# Patient Record
Sex: Male | Born: 1935 | Race: White | Hispanic: No | Marital: Married | Smoking: Former smoker
Health system: Southern US, Community
[De-identification: ages and names within clinical notes are randomized; demographics above are authoritative.]

## PROBLEM LIST (undated history)

## (undated) DIAGNOSIS — Z960 Presence of urogenital implants: Secondary | ICD-10-CM

## (undated) DIAGNOSIS — I714 Abdominal aortic aneurysm, without rupture, unspecified: Secondary | ICD-10-CM

## (undated) DIAGNOSIS — I4891 Unspecified atrial fibrillation: Secondary | ICD-10-CM

## (undated) DIAGNOSIS — R42 Dizziness and giddiness: Secondary | ICD-10-CM

## (undated) DIAGNOSIS — E785 Hyperlipidemia, unspecified: Secondary | ICD-10-CM

## (undated) DIAGNOSIS — J189 Pneumonia, unspecified organism: Secondary | ICD-10-CM

## (undated) DIAGNOSIS — E1169 Type 2 diabetes mellitus with other specified complication: Secondary | ICD-10-CM

## (undated) DIAGNOSIS — G4730 Sleep apnea, unspecified: Secondary | ICD-10-CM

## (undated) DIAGNOSIS — N179 Acute kidney failure, unspecified: Secondary | ICD-10-CM

## (undated) DIAGNOSIS — I25119 Atherosclerotic heart disease of native coronary artery with unspecified angina pectoris: Secondary | ICD-10-CM

## (undated) DIAGNOSIS — D62 Acute posthemorrhagic anemia: Secondary | ICD-10-CM

## (undated) DIAGNOSIS — I214 Non-ST elevation (NSTEMI) myocardial infarction: Secondary | ICD-10-CM

## (undated) DIAGNOSIS — I2119 ST elevation (STEMI) myocardial infarction involving other coronary artery of inferior wall: Secondary | ICD-10-CM

## (undated) DIAGNOSIS — D649 Anemia, unspecified: Secondary | ICD-10-CM

## (undated) DIAGNOSIS — M1990 Unspecified osteoarthritis, unspecified site: Secondary | ICD-10-CM

## (undated) DIAGNOSIS — Z978 Presence of other specified devices: Secondary | ICD-10-CM

## (undated) DIAGNOSIS — Z9359 Other cystostomy status: Secondary | ICD-10-CM

## (undated) DIAGNOSIS — D638 Anemia in other chronic diseases classified elsewhere: Secondary | ICD-10-CM

## (undated) DIAGNOSIS — IMO0001 Reserved for inherently not codable concepts without codable children: Secondary | ICD-10-CM

## (undated) DIAGNOSIS — I10 Essential (primary) hypertension: Secondary | ICD-10-CM

## (undated) DIAGNOSIS — Z789 Other specified health status: Secondary | ICD-10-CM

## (undated) DIAGNOSIS — I5042 Chronic combined systolic (congestive) and diastolic (congestive) heart failure: Secondary | ICD-10-CM

## (undated) DIAGNOSIS — I6522 Occlusion and stenosis of left carotid artery: Secondary | ICD-10-CM

## (undated) DIAGNOSIS — E1142 Type 2 diabetes mellitus with diabetic polyneuropathy: Secondary | ICD-10-CM

## (undated) DIAGNOSIS — Z951 Presence of aortocoronary bypass graft: Secondary | ICD-10-CM

## (undated) DIAGNOSIS — I9789 Other postprocedural complications and disorders of the circulatory system, not elsewhere classified: Secondary | ICD-10-CM

## (undated) DIAGNOSIS — E1165 Type 2 diabetes mellitus with hyperglycemia: Secondary | ICD-10-CM

## (undated) DIAGNOSIS — I169 Hypertensive crisis, unspecified: Secondary | ICD-10-CM

## (undated) DIAGNOSIS — I5189 Other ill-defined heart diseases: Secondary | ICD-10-CM

## (undated) DIAGNOSIS — Z531 Procedure and treatment not carried out because of patient's decision for reasons of belief and group pressure: Secondary | ICD-10-CM

## (undated) DIAGNOSIS — E119 Type 2 diabetes mellitus without complications: Secondary | ICD-10-CM

## (undated) DIAGNOSIS — N480 Leukoplakia of penis: Secondary | ICD-10-CM

## (undated) DIAGNOSIS — I2510 Atherosclerotic heart disease of native coronary artery without angina pectoris: Secondary | ICD-10-CM

## (undated) DIAGNOSIS — I639 Cerebral infarction, unspecified: Secondary | ICD-10-CM

## (undated) HISTORY — DX: Essential (primary) hypertension: I10

## (undated) HISTORY — DX: Occlusion and stenosis of left carotid artery: I65.22

## (undated) HISTORY — DX: Type 2 diabetes mellitus with diabetic polyneuropathy: E11.65

## (undated) HISTORY — PX: NO PAST SURGERIES: SHX2092

## (undated) HISTORY — DX: Unspecified atrial fibrillation: I48.91

## (undated) HISTORY — PX: CYSTOSCOPY WITH URETHRAL DILATATION: SHX5125

## (undated) HISTORY — DX: Anemia in other chronic diseases classified elsewhere: D63.8

## (undated) HISTORY — PX: APPENDECTOMY: SHX54

## (undated) HISTORY — DX: Hypertensive crisis, unspecified: I16.9

## (undated) HISTORY — DX: Non-ST elevation (NSTEMI) myocardial infarction: I21.4

## (undated) HISTORY — DX: Type 2 diabetes mellitus with other specified complication: E11.69

## (undated) HISTORY — DX: Atherosclerotic heart disease of native coronary artery without angina pectoris: I25.10

## (undated) HISTORY — DX: Type 2 diabetes mellitus with diabetic polyneuropathy: E11.42

## (undated) HISTORY — DX: Leukoplakia of penis: N48.0

## (undated) HISTORY — DX: Abdominal aortic aneurysm, without rupture: I71.4

## (undated) HISTORY — DX: Other cystostomy status: Z93.59

## (undated) HISTORY — DX: Abdominal aortic aneurysm, without rupture, unspecified: I71.40

## (undated) HISTORY — DX: Presence of aortocoronary bypass graft: Z95.1

## (undated) HISTORY — DX: Procedure and treatment not carried out because of patient's decision for reasons of belief and group pressure: Z53.1

## (undated) HISTORY — DX: ST elevation (STEMI) myocardial infarction involving other coronary artery of inferior wall: I21.19

## (undated) HISTORY — DX: Atherosclerotic heart disease of native coronary artery with unspecified angina pectoris: I25.119

## (undated) HISTORY — DX: Dizziness and giddiness: R42

## (undated) HISTORY — DX: Hyperlipidemia, unspecified: E78.5

## (undated) HISTORY — DX: Chronic combined systolic (congestive) and diastolic (congestive) heart failure: I50.42

## (undated) HISTORY — DX: Other ill-defined heart diseases: I51.89

## (undated) HISTORY — DX: Acute posthemorrhagic anemia: D62

## (undated) HISTORY — DX: Reserved for inherently not codable concepts without codable children: IMO0001

## (undated) HISTORY — DX: Sleep apnea, unspecified: G47.30

## (undated) HISTORY — DX: Other postprocedural complications and disorders of the circulatory system, not elsewhere classified: I97.89

## (undated) HISTORY — PX: TONSILLECTOMY: SUR1361

## (undated) HISTORY — PX: TRANSTHORACIC ECHOCARDIOGRAM: SHX275

---

## 1998-05-23 ENCOUNTER — Ambulatory Visit (HOSPITAL_BASED_OUTPATIENT_CLINIC_OR_DEPARTMENT_OTHER): Admission: RE | Admit: 1998-05-23 | Discharge: 1998-05-23 | Payer: Self-pay | Admitting: Urology

## 2008-10-22 ENCOUNTER — Encounter (INDEPENDENT_AMBULATORY_CARE_PROVIDER_SITE_OTHER): Payer: Self-pay | Admitting: General Surgery

## 2008-10-22 ENCOUNTER — Inpatient Hospital Stay (HOSPITAL_COMMUNITY): Admission: EM | Admit: 2008-10-22 | Discharge: 2008-10-27 | Payer: Self-pay | Admitting: Emergency Medicine

## 2009-06-13 ENCOUNTER — Emergency Department (HOSPITAL_COMMUNITY): Admission: EM | Admit: 2009-06-13 | Discharge: 2009-06-13 | Payer: Self-pay | Admitting: Emergency Medicine

## 2009-06-19 ENCOUNTER — Encounter: Admission: RE | Admit: 2009-06-19 | Discharge: 2009-06-19 | Payer: Self-pay | Admitting: Family Medicine

## 2010-02-09 ENCOUNTER — Inpatient Hospital Stay (HOSPITAL_COMMUNITY): Admission: EM | Admit: 2010-02-09 | Discharge: 2010-02-09 | Payer: Self-pay | Admitting: Emergency Medicine

## 2010-02-19 ENCOUNTER — Encounter: Admission: RE | Admit: 2010-02-19 | Discharge: 2010-02-19 | Payer: Self-pay | Admitting: Cardiology

## 2010-06-04 ENCOUNTER — Encounter
Admission: RE | Admit: 2010-06-04 | Discharge: 2010-06-04 | Payer: Self-pay | Source: Home / Self Care | Attending: Family Medicine | Admitting: Family Medicine

## 2010-07-31 LAB — DIFFERENTIAL
Basophils Absolute: 0 10*3/uL (ref 0.0–0.1)
Basophils Relative: 1 % (ref 0–1)
Neutro Abs: 5.2 10*3/uL (ref 1.7–7.7)
Neutrophils Relative %: 64 % (ref 43–77)

## 2010-07-31 LAB — COMPREHENSIVE METABOLIC PANEL
ALT: 28 U/L (ref 0–53)
AST: 27 U/L (ref 0–37)
Albumin: 3.6 g/dL (ref 3.5–5.2)
Alkaline Phosphatase: 66 U/L (ref 39–117)
Alkaline Phosphatase: 71 U/L (ref 39–117)
BUN: 19 mg/dL (ref 6–23)
CO2: 25 mEq/L (ref 19–32)
Chloride: 102 mEq/L (ref 96–112)
Creatinine, Ser: 1.01 mg/dL (ref 0.4–1.5)
GFR calc Af Amer: >60 mL/min (ref >60)
GFR calc non Af Amer: >60 mL/min (ref >60)
Glucose, Bld: 259 mg/dL — ABNORMAL HIGH (ref 70–99)
Potassium: 3.5 mEq/L (ref 3.5–5.1)
Potassium: 3.7 mEq/L (ref 3.5–5.1)
Sodium: 135 mEq/L (ref 135–145)
Total Bilirubin: 0.4 mg/dL (ref 0.3–1.2)
Total Bilirubin: 0.5 mg/dL (ref 0.3–1.2)
Total Protein: 7.3 g/dL (ref 6.0–8.3)

## 2010-07-31 LAB — CBC
HCT: 43.7 % (ref 39.0–52.0)
Hemoglobin: 12.8 g/dL — ABNORMAL LOW (ref 13.0–17.0)
MCHC: 33.9 g/dL (ref 30.0–36.0)
Platelets: 228 10*3/uL (ref 150–400)
Platelets: 229 10*3/uL (ref 150–400)
RBC: 4.45 MIL/uL (ref 4.22–5.81)
RDW: 14 % (ref 11.5–15.5)
WBC: 7.1 10*3/uL (ref 4.0–10.5)

## 2010-07-31 LAB — URINALYSIS, ROUTINE W REFLEX MICROSCOPIC
Leukocytes, UA: NEGATIVE
Nitrite: NEGATIVE
Specific Gravity, Urine: 1.007 (ref 1.005–1.030)
pH: 7.5 (ref 5.0–8.0)

## 2010-07-31 LAB — LIPID PANEL
LDL Cholesterol: 50 mg/dL (ref 0–99)
Total CHOL/HDL Ratio: 2.4 RATIO
Triglycerides: 113 mg/dL (ref <150)
VLDL: 23 mg/dL (ref 0–40)

## 2010-07-31 LAB — MAGNESIUM: Magnesium: 2.1 mg/dL (ref 1.5–2.5)

## 2010-07-31 LAB — GLUCOSE, CAPILLARY

## 2010-07-31 LAB — URINE MICROSCOPIC-ADD ON

## 2010-07-31 LAB — URINE CULTURE

## 2010-07-31 LAB — CK TOTAL AND CKMB (NOT AT ARMC)
CK, MB: 3.3 ng/mL (ref 0.3–4.0)
Relative Index: 1.8 (ref 0.0–2.5)

## 2010-07-31 LAB — TROPONIN I: Troponin I: 0.06 ng/mL (ref 0.00–0.06)

## 2010-07-31 LAB — HEPARIN LEVEL (UNFRACTIONATED): Heparin Unfractionated: 0.4 IU/mL (ref 0.30–0.70)

## 2010-07-31 LAB — CARDIAC PANEL(CRET KIN+CKTOT+MB+TROPI)
CK, MB: 2.9 ng/mL (ref 0.3–4.0)
Total CK: 135 U/L (ref 7–232)

## 2010-08-04 LAB — CBC
Hemoglobin: 14.1 g/dL (ref 13.0–17.0)
RDW: 13.3 % (ref 11.5–15.5)
WBC: 7.3 10*3/uL (ref 4.0–10.5)

## 2010-08-04 LAB — DIFFERENTIAL
Basophils Relative: 1 % (ref 0–1)
Eosinophils Absolute: 0.4 10*3/uL (ref 0.0–0.7)
Monocytes Absolute: 0.5 10*3/uL (ref 0.1–1.0)
Monocytes Relative: 7 % (ref 3–12)
Neutrophils Relative %: 64 % (ref 43–77)

## 2010-08-04 LAB — URINALYSIS, ROUTINE W REFLEX MICROSCOPIC
Bilirubin Urine: NEGATIVE
Hgb urine dipstick: NEGATIVE
Protein, ur: >300 mg/dL — AB
Urobilinogen, UA: 0.2 mg/dL (ref 0.0–1.0)

## 2010-08-04 LAB — COMPREHENSIVE METABOLIC PANEL
ALT: 21 U/L (ref 0–53)
Albumin: 3.7 g/dL (ref 3.5–5.2)
Alkaline Phosphatase: 66 U/L (ref 39–117)
Chloride: 104 mEq/L (ref 96–112)
Glucose, Bld: 162 mg/dL — ABNORMAL HIGH (ref 70–99)
Potassium: 4.8 mEq/L (ref 3.5–5.1)
Sodium: 140 mEq/L (ref 135–145)
Total Bilirubin: 0.4 mg/dL (ref 0.3–1.2)
Total Protein: 6.4 g/dL (ref 6.0–8.3)

## 2010-08-25 LAB — URINALYSIS, ROUTINE W REFLEX MICROSCOPIC
Bilirubin Urine: NEGATIVE
Glucose, UA: NEGATIVE mg/dL
Ketones, ur: 15 mg/dL — AB
pH: 5 (ref 5.0–8.0)

## 2010-08-25 LAB — DIFFERENTIAL
Eosinophils Absolute: 0 10*3/uL (ref 0.0–0.7)
Lymphs Abs: 0.6 10*3/uL — ABNORMAL LOW (ref 0.7–4.0)
Monocytes Relative: 4 % (ref 3–12)
Neutro Abs: 11.2 10*3/uL — ABNORMAL HIGH (ref 1.7–7.7)
Neutrophils Relative %: 91 % — ABNORMAL HIGH (ref 43–77)

## 2010-08-25 LAB — CBC
Hemoglobin: 13.5 g/dL (ref 13.0–17.0)
MCHC: 34.3 g/dL (ref 30.0–36.0)
MCV: 85.2 fL (ref 78.0–100.0)
MCV: 86 fL (ref 78.0–100.0)
Platelets: 248 10*3/uL (ref 150–400)
RBC: 4.31 MIL/uL (ref 4.22–5.81)
RBC: 4.58 MIL/uL (ref 4.22–5.81)
RDW: 14.4 % (ref 11.5–15.5)
WBC: 12.3 10*3/uL — ABNORMAL HIGH (ref 4.0–10.5)

## 2010-08-25 LAB — GLUCOSE, CAPILLARY
Glucose-Capillary: 134 mg/dL — ABNORMAL HIGH (ref 70–99)
Glucose-Capillary: 136 mg/dL — ABNORMAL HIGH (ref 70–99)
Glucose-Capillary: 139 mg/dL — ABNORMAL HIGH (ref 70–99)
Glucose-Capillary: 140 mg/dL — ABNORMAL HIGH (ref 70–99)
Glucose-Capillary: 142 mg/dL — ABNORMAL HIGH (ref 70–99)
Glucose-Capillary: 144 mg/dL — ABNORMAL HIGH (ref 70–99)
Glucose-Capillary: 144 mg/dL — ABNORMAL HIGH (ref 70–99)
Glucose-Capillary: 145 mg/dL — ABNORMAL HIGH (ref 70–99)
Glucose-Capillary: 146 mg/dL — ABNORMAL HIGH (ref 70–99)
Glucose-Capillary: 163 mg/dL — ABNORMAL HIGH (ref 70–99)
Glucose-Capillary: 191 mg/dL — ABNORMAL HIGH (ref 70–99)
Glucose-Capillary: 231 mg/dL — ABNORMAL HIGH (ref 70–99)

## 2010-08-25 LAB — BASIC METABOLIC PANEL
BUN: 17 mg/dL (ref 6–23)
CO2: 28 mEq/L (ref 19–32)
Calcium: 8.3 mg/dL — ABNORMAL LOW (ref 8.4–10.5)
Calcium: 8.9 mg/dL (ref 8.4–10.5)
Chloride: 104 mEq/L (ref 96–112)
Chloride: 105 mEq/L (ref 96–112)
Creatinine, Ser: 0.95 mg/dL (ref 0.4–1.5)
Creatinine, Ser: 1.01 mg/dL (ref 0.4–1.5)
GFR calc Af Amer: >60 mL/min (ref >60)
GFR calc Af Amer: >60 mL/min (ref >60)
GFR calc non Af Amer: >60 mL/min (ref >60)
Glucose, Bld: 139 mg/dL — ABNORMAL HIGH (ref 70–99)
Sodium: 138 mEq/L (ref 135–145)
Sodium: 140 mEq/L (ref 135–145)

## 2010-08-25 LAB — POCT I-STAT 4, (NA,K, GLUC, HGB,HCT)
Glucose, Bld: 102 mg/dL — ABNORMAL HIGH (ref 70–99)
Potassium: 3.7 mEq/L (ref 3.5–5.1)

## 2010-08-25 LAB — URINE MICROSCOPIC-ADD ON

## 2010-09-30 NOTE — H&P (Signed)
NAME:  [name], [name] NO.:  [id number]   MEDICAL RECORD NO.:  [id number]          PATIENT TYPE:  INP   LOCATION:  5125                         FACILITY:  [facility]   PHYSICIAN:  Gwenyth Ober, M.D.    DATE OF BIRTH:  [birth year]   DATE OF ADMISSION:  10/22/2008  DATE OF DISCHARGE:                              HISTORY & PHYSICAL   CHIEF COMPLAINT:  The patient is a 77 year old set up from [facility] in [place] with a diagnosis of acute  appendicitis noted on a CT scan.   HISTORY OF PRESENT ILLNESS:  When questioning the patient, he states he  has been having some lower abdominal discomfort for the past 3 weeks,  which he attributed to perhaps chronic urinary tract infection, which he  has had before or some prostatitis.  He worsened on Friday and then  through the weekend to where he had to come to his doctor down in the  [place] area where he was subsequently sent to [facility] for a CT  scan, which demonstrated acute appendicitis with an appendicolith.  There was no evidence of rupture.  He was sent directly up to [facility] for evaluation.  However, we did not receive a phone call from  any of his doctors.   PAST MEDICAL HISTORY:  Significant for;  1. Hypertension.  2. Non-insulin-dependent diabetes.   He is not allergic to any medications.   CURRENT MEDICATIONS:  1. Hydrochlorothiazide 25 mg p.o. at bedtime.  2. Metformin 1000 mg p.o. b.i.d.  3. Avandia 8 mg p.o. daily.  4. Altace/ramipril combination 5-10 mg p.o. daily.  5. Glimepiride 4 mg at bedtime.   He has had only tonsils and adenoids removed, no other surgery.   REVIEW OF SYSTEMS:  He has had no fevers, no chills, no nausea, no  vomiting.  Last meal was at 6 o'clock this morning.   Family history is noncontributory.  Last bowel movement was yesterday.   PHYSICAL EXAMINATION:  VITAL SIGNS:  On examination, he is afebrile, his  other vital signs are stable.  HEENT;  He is normocephalic and atraumatic and anicteric.  NECK:  Supple.  He has no carotid bruits.  No palpable masses.  LUNGS:  Clear to auscultation.  CARDIAC:  Regular rhythm and rate with no murmurs.  ABDOMEN:  Distended, mildly obese, tender with deep palpation in the  right lower quadrant without rebound or guarding.  RECTAL:  Not performed.  NEUROLOGIC:  Cranial nerves II through XII are grossly intact.   LABORATORY STUDIES:  From the outside facility, he has a normal white  count of 5.9, hemoglobin 13, hematocrit of 42, hemoglobin A1c is 7.3.  Electrolytes within normal limits.  Chest x-ray here does not show any  focal lesions, no evidence of congestive heart failure.   IMPRESSION:  Acute appendicitis by CT scan.   PLAN:  To perform an appendectomy laparoscopically.  This will be done  as soon as possible.  He will get Unasyn 3 g prior to going to the  operating room.  I  have explained the risks and benefits to the patient.  He understands including possibility of happen to convert to an open  procedure.      Gwenyth Ober, M.D.  Electronically Signed     JOW/MEDQ  D:  10/22/2008  T:  10/23/2008  Job:  [id number]

## 2010-09-30 NOTE — Op Note (Signed)
NAMEQWENTIN, MANER NO.:  [id number]   MEDICAL RECORD NO.:  [id number]          PATIENT TYPE:  INP   LOCATION:  5125                         FACILITY:  [facility]   PHYSICIAN:  Gwenyth Ober, M.D.    DATE OF BIRTH:  Nov 05, 1930   DATE OF PROCEDURE:  DATE OF DISCHARGE:                               OPERATIVE REPORT   PREOPERATIVE DIAGNOSIS:  Acute appendicitis, possible rupture.   POSTOPERATIVE DIAGNOSIS:  Ruptured acute appendicitis.   PROCEDURE:  Open appendectomy converted from laparoscopic attempt.   SURGEON:  Kathryne Eriksson. Hulen Skains, MD   ANESTHESIA:  General endotracheal.   ESTIMATED BLOOD LOSS:  100 mL.   COMPLICATIONS:  None.   CONDITION:  Stable.   FINDINGS:  Ruptured acute appendicitis tethered down to the right lower  quadrant, also to an appendix epiploica of the sigmoid colon.   INDICATIONS FOR OPERATION:  The patient is a 77 year old with abdominal  pain for several weeks, worsened over the last 3 days who now comes in  with a CT-diagnosed acute appendicitis with possible rupture.   OPERATION:  The patient was taken to the operating room and placed on  the table in the supine position.  After an adequate general  endotracheal anesthetic was administered, he was prepped and draped in  the usual sterile manner, exposing the midline and the entire abdomen.   Prior to prepping and draping the patient, Foley cath has been placed.  It was noted that the patient had somewhat of a phimosis.  We kept his  Foley in postoperatively.   The patient also had an umbilical hernia which was used as entry site  for our Hasson cannula which we placed after making circum-umbilical  incision using a 15-blade and then dissecting down the fascia around the  umbilical hernia.  A pursestring suture was placed with 0 Vicryl then  Hasson cannula passed into the peritoneal cavity but we insufflated  carbon dioxide gas up to a maximal pressure of 50 mmHg.   Right upper  quadrant 5-mm cannula was passed under direct vision and  with the patient in Trendelenburg, the left side was tilted down, we did  see a large inflammatory mass in the large lower quadrant that we  attempted to dissect free but could not do successfully.  There was a  pocket of pus which we entered into that was aspirated.  Once we could  see those minimal mobilization, difficulty doing so with laparoscope,  then the decision was made to open.   The umbilical hernia was closed using a pursestring suture which was in  place.  We then made a lower midline incision up to and just to the left  of the umbilicus using a 123XX123 blade.  We went down through the midline  fascia into the peritoneal cavity and opened it completely.  The patient  in some Trendelenburg, left side down, and some retractors placed on the  right side, we mobilized the appendix from its inflammatory surroundings  in the right lower quadrant.  There was a moderate bleeding but this was  controlled.  The mesoappendix was controlled with Kelly clamps and 2-0  silk ties.  The base of the appendix was come across using endo GIA 3.5  mm closure stapler.  We irrigated with about 4 liters of saline  solution, placed a 19-mm Blake drain in the right lower quadrant.  There  was minimal bleeding at the closure.  There was no active pus but an  abscess cavity was noted in the right lower quadrant where the appendix  had been removed.   We irrigated again with 4 liters of saline, then we closed.  The midline  incision was closed using looped #1 PDS suture.  We used some Monocryls  to tack down the umbilical incision.  Right upper quadrant skin site was  closed using stainless steel staples and after changing our gloves prior  to closing the fascia, we closed the midline incision using stainless  steel staples.  All needle counts, sponge counts, and instrument counts  were correct.  Sterile dressings were applied.  The drain in right  lower  quadrant was secured in place using 3-0 nylon.      Gwenyth Ober, M.D.  Electronically Signed     JOW/MEDQ  D:  10/22/2008  T:  10/23/2008  Job:  [id number]

## 2010-09-30 NOTE — Discharge Summary (Signed)
NAMEJARDON, [name] NO.:  [id number]   MEDICAL RECORD NO.:  [id number]          PATIENT TYPE:  INP   LOCATION:  [id number]                         FACILITY:  [facility]   PHYSICIAN:  Stark Klein, MD       DATE OF BIRTH:  Feb 20, 1931   DATE OF ADMISSION:  10/22/2008  DATE OF DISCHARGE:                               DISCHARGE SUMMARY   ADMITTING PHYSICIAN:  Gwenyth Ober, M.D.   DATE OF DISCHARGE:  Pending, anticipate either October 27, 2008 or October 28, 2008.   DISCHARGING PHYSICIAN:  On call physician this weekend.   CONSULTANTS:  None.   PROCEDURE:  Laparoscopic converted to open appendectomy for perforated  appendicitis.  This was on October 22, 2008 by Dr. Judeth Horn.   REASON FOR ADMISSION:  [name] is a 77 year old white male who was  sent from [facility] with a diagnosis of acute appendicitis based  on CT scan.  He states he has been having lower abdominal discomfort for  the past 3 weeks but attributed this to a chronic urinary tract  infection.  This worsened this past Friday and through the weekend to  where he had to present to his doctor in [place] the following day.  Subsequently, he did have the CT scan at [facility] that showed  acute appendicitis.  Therefore, he was transferred up here for surgical  intervention.   ADMITTING DIAGNOSES:  1. Acute appendicitis.  2. Hypertension.  3. Non-insulin-dependent diabetes.   HOSPITAL COURSE:  The patient was admitted and taken to the operating  room where the laparoscopic appendectomy was started.  However, due to  the appendix being ruptured and unable to be removed laparoscopically  this was converted to an open procedure.  The patient did tolerate this  procedure well and was then transferred to a regular floor bed.  On  admission, he had a white blood cell count that was normal at 5900,  however, on the first postoperative day it had increased to 12,300.  He  was denying flatus and was not  hungry; however, his pain was well  controlled.  On exam, his abdomen was soft with some mild distention.  No bowel sounds were heard, and a JP drain was present with  serosanguineous output.  At this time, the patient was kept n.p.o. due  to postoperative ileus except for sips and chips.  For the next day he  was kept on sips of clear liquids and ice chips due to a postoperative  ileus and by postoperative day #3 the patient was beginning to pass  flatus and his pain was well controlled.  At this time, his white blood  cell count had gone back down to normal at 9400.  On exam, his abdomen  was soft with minimal tenderness and active bowel sounds.  At this time,  his incision was clean, dry and intact with staples present.  His diet  was then advanced to clear liquids.  By postoperative day #4, he was  tolerating clears and advanced to full liquids.  He was complaining of  feeling like he needed to have a bowel movement, but he was unable and  therefore suppository was given.  At this time, his PCA was  discontinued, and he was started on p.o. pain pills, pending tolerating  full liquids.  At this time, it was set up for the patient to be taught  about drain management in case the JP drain was not discontinued prior  to discharge to home.  At this time, it is anticipated that the  patient's diet will continue to be advanced as tolerated and that he  will be discharged over the weekend.  A followup appointment has already  been made.   DISCHARGE DIAGNOSES:  1. Acute perforated appendicitis.  2. Status post laparoscopic converted open appendectomy.  3. Hypertension.  4. Insulin-dependent diabetes mellitus.   DISCHARGE MEDICATIONS:  The patient may resume all home medications  including:  1. Aspirin 81 mg a day.  2. Hydrochlorothiazide 25 mg a day.  3. Metformin 1000 mg twice a day.  4. Avandia, it looks like 8 mg daily.  5. Altace/ramipril 10 mg daily.  6. Glipizide 10 mg daily.   7. He is also given a prescription for Percocet 5/325 one to two p.o.      q.4 h. p.r.n. pain.   DISCHARGE INSTRUCTIONS:  The patient has no dietary restrictions except  for to maintain a diet appropriate for his diabetes.  He is to increase  his activity slowly and walk up steps.  He may shower; however, he is  not to lift anything greater than approximately 15 pounds for the next 4-  6 weeks and he is not to drive for the next 1-2 weeks.  At the time of  discharge, the patient still has the JP drain present.  He is to empty  and record his output and bring this with him to his followup visit.  Otherwise, he is to call our office for fever greater than 101.5 or  worsening abdominal pain.  He has a followup appointment with Dr.  Richarda Blade nurse on November 01, 2008 at 9:30 a.m. for staple and possible  drain removal if not done prior to discharge home.  Otherwise, he is to  follow up with Dr. Hulen Skains in approximately 2 weeks and he is to call for  this appointment.      Henreitta Cea, PA      Stark Klein, MD  Electronically Signed    KEO/MEDQ  D:  10/26/2008  T:  10/27/2008  Job:  [id number]   cc:   Gwenyth Ober, M.D.

## 2010-09-30 NOTE — Discharge Summary (Signed)
NAME:  [name], [name]                ACCOUNT NO.:  [id number]   MEDICAL RECORD NO.:  [id number]          PATIENT TYPE:  INP   LOCATION:  5153                         FACILITY:  [facility]   PHYSICIAN:  Stark Klein, MD       DATE OF BIRTH:  Oct 09, 1930   DATE OF ADMISSION:  10/22/2008  DATE OF DISCHARGE:                               DISCHARGE SUMMARY   ADDENDUM   The patient will also be sent home on Augmentin 875 mg 1 p.o. b.i.d. x7  days due to his perforation.      Henreitta Cea, PA      Stark Klein, MD  Electronically Signed    KEO/MEDQ  D:  10/26/2008  T:  10/26/2008  Job:  [id number]   cc:   Gwenyth Ober, M.D.

## 2010-10-07 ENCOUNTER — Other Ambulatory Visit: Payer: Self-pay | Admitting: Dermatology

## 2010-11-16 DIAGNOSIS — I4891 Unspecified atrial fibrillation: Secondary | ICD-10-CM

## 2010-11-16 DIAGNOSIS — Z951 Presence of aortocoronary bypass graft: Secondary | ICD-10-CM

## 2010-11-16 DIAGNOSIS — I214 Non-ST elevation (NSTEMI) myocardial infarction: Secondary | ICD-10-CM

## 2010-11-16 HISTORY — DX: Non-ST elevation (NSTEMI) myocardial infarction: I21.4

## 2010-11-16 HISTORY — DX: Unspecified atrial fibrillation: I48.91

## 2010-11-16 HISTORY — DX: Presence of aortocoronary bypass graft: Z95.1

## 2010-12-09 ENCOUNTER — Emergency Department (HOSPITAL_COMMUNITY): Payer: Medicare Other

## 2010-12-09 ENCOUNTER — Inpatient Hospital Stay (HOSPITAL_COMMUNITY)
Admission: EM | Admit: 2010-12-09 | Discharge: 2010-12-20 | DRG: 234 | Disposition: A | Discharge status: Home / Self Care | Payer: Medicare Other | Attending: Cardiothoracic Surgery | Admitting: Cardiothoracic Surgery

## 2010-12-09 DIAGNOSIS — I4891 Unspecified atrial fibrillation: Secondary | ICD-10-CM | POA: Diagnosis present

## 2010-12-09 DIAGNOSIS — Z794 Long term (current) use of insulin: Secondary | ICD-10-CM

## 2010-12-09 DIAGNOSIS — I2510 Atherosclerotic heart disease of native coronary artery without angina pectoris: Principal | ICD-10-CM | POA: Diagnosis present

## 2010-12-09 DIAGNOSIS — I10 Essential (primary) hypertension: Secondary | ICD-10-CM | POA: Diagnosis present

## 2010-12-09 DIAGNOSIS — J9819 Other pulmonary collapse: Secondary | ICD-10-CM | POA: Diagnosis not present

## 2010-12-09 DIAGNOSIS — G4733 Obstructive sleep apnea (adult) (pediatric): Secondary | ICD-10-CM | POA: Diagnosis present

## 2010-12-09 DIAGNOSIS — I498 Other specified cardiac arrhythmias: Secondary | ICD-10-CM | POA: Diagnosis present

## 2010-12-09 DIAGNOSIS — N35919 Unspecified urethral stricture, male, unspecified site: Secondary | ICD-10-CM | POA: Diagnosis present

## 2010-12-09 DIAGNOSIS — Z66 Do not resuscitate: Secondary | ICD-10-CM | POA: Diagnosis present

## 2010-12-09 DIAGNOSIS — Z7982 Long term (current) use of aspirin: Secondary | ICD-10-CM

## 2010-12-09 DIAGNOSIS — IMO0002 Reserved for concepts with insufficient information to code with codable children: Secondary | ICD-10-CM | POA: Diagnosis present

## 2010-12-09 DIAGNOSIS — D62 Acute posthemorrhagic anemia: Secondary | ICD-10-CM | POA: Diagnosis not present

## 2010-12-09 DIAGNOSIS — I714 Abdominal aortic aneurysm, without rupture, unspecified: Secondary | ICD-10-CM | POA: Diagnosis present

## 2010-12-09 DIAGNOSIS — N480 Leukoplakia of penis: Secondary | ICD-10-CM | POA: Diagnosis present

## 2010-12-09 DIAGNOSIS — E1165 Type 2 diabetes mellitus with hyperglycemia: Secondary | ICD-10-CM | POA: Diagnosis present

## 2010-12-09 DIAGNOSIS — Z7901 Long term (current) use of anticoagulants: Secondary | ICD-10-CM

## 2010-12-09 LAB — TROPONIN I: Troponin I: <0.3 ng/mL (ref <0.30)

## 2010-12-09 LAB — MRSA PCR SCREENING: MRSA by PCR: NEGATIVE

## 2010-12-09 LAB — GLUCOSE, CAPILLARY
Glucose-Capillary: 300 mg/dL — ABNORMAL HIGH (ref 70–99)
Glucose-Capillary: 195 mg/dL — ABNORMAL HIGH (ref 70–99)

## 2010-12-09 LAB — COMPREHENSIVE METABOLIC PANEL
ALT: 18 U/L (ref 0–53)
AST: 18 U/L (ref 0–37)
Albumin: 3.3 g/dL — ABNORMAL LOW (ref 3.5–5.2)
Calcium: 9.1 mg/dL (ref 8.4–10.5)
Creatinine, Ser: 0.96 mg/dL (ref 0.50–1.35)
Sodium: 139 mEq/L (ref 135–145)
Total Protein: 6.4 g/dL (ref 6.0–8.3)

## 2010-12-09 LAB — TSH: TSH: 0.46 u[IU]/mL (ref 0.350–4.500)

## 2010-12-09 LAB — CK TOTAL AND CKMB (NOT AT ARMC)
Relative Index: INVALID (ref 0.0–2.5)
Total CK: 65 U/L (ref 7–232)

## 2010-12-09 LAB — PROTIME-INR: INR: 2.13 — ABNORMAL HIGH (ref 0.00–1.49)

## 2010-12-09 LAB — CBC
Hemoglobin: 13 g/dL (ref 13.0–17.0)
Platelets: 217 10*3/uL (ref 150–400)
RBC: 4.59 MIL/uL (ref 4.22–5.81)
WBC: 6.3 10*3/uL (ref 4.0–10.5)

## 2010-12-09 LAB — APTT: aPTT: 37 seconds (ref 24–37)

## 2010-12-09 LAB — HEMOGLOBIN A1C: Hgb A1c MFr Bld: 10 % — ABNORMAL HIGH (ref <5.7)

## 2010-12-09 LAB — CARDIAC PANEL(CRET KIN+CKTOT+MB+TROPI): CK, MB: 1.6 ng/mL (ref 0.3–4.0)

## 2010-12-09 LAB — MAGNESIUM: Magnesium: 1.9 mg/dL (ref 1.5–2.5)

## 2010-12-10 LAB — BASIC METABOLIC PANEL
Calcium: 8.8 mg/dL (ref 8.4–10.5)
Creatinine, Ser: 0.87 mg/dL (ref 0.50–1.35)
GFR calc non Af Amer: >60 mL/min (ref >60)
Glucose, Bld: 178 mg/dL — ABNORMAL HIGH (ref 70–99)
Sodium: 139 mEq/L (ref 135–145)

## 2010-12-10 LAB — LIPID PANEL
LDL Cholesterol: 109 mg/dL — ABNORMAL HIGH (ref 0–99)
Triglycerides: 141 mg/dL (ref <150)
VLDL: 28 mg/dL (ref 0–40)

## 2010-12-10 LAB — CARDIAC PANEL(CRET KIN+CKTOT+MB+TROPI)
CK, MB: 1.7 ng/mL (ref 0.3–4.0)
Relative Index: INVALID (ref 0.0–2.5)
Relative Index: INVALID (ref 0.0–2.5)
Total CK: 52 U/L (ref 7–232)
Total CK: 47 U/L (ref 7–232)

## 2010-12-10 LAB — CBC
Hemoglobin: 12.3 g/dL — ABNORMAL LOW (ref 13.0–17.0)
MCV: 83.8 fL (ref 78.0–100.0)
Platelets: 218 10*3/uL (ref 150–400)

## 2010-12-10 LAB — PROTIME-INR
INR: 2.09 — ABNORMAL HIGH (ref 0.00–1.49)
INR: 1.88 — ABNORMAL HIGH (ref 0.00–1.49)
Prothrombin Time: 21.9 seconds — ABNORMAL HIGH (ref 11.6–15.2)

## 2010-12-10 LAB — GLUCOSE, CAPILLARY
Glucose-Capillary: 182 mg/dL — ABNORMAL HIGH (ref 70–99)
Glucose-Capillary: 206 mg/dL — ABNORMAL HIGH (ref 70–99)
Glucose-Capillary: 263 mg/dL — ABNORMAL HIGH (ref 70–99)

## 2010-12-11 ENCOUNTER — Inpatient Hospital Stay (HOSPITAL_COMMUNITY): Payer: Medicare Other

## 2010-12-11 DIAGNOSIS — I200 Unstable angina: Secondary | ICD-10-CM

## 2010-12-11 DIAGNOSIS — I4891 Unspecified atrial fibrillation: Secondary | ICD-10-CM

## 2010-12-11 DIAGNOSIS — I2510 Atherosclerotic heart disease of native coronary artery without angina pectoris: Secondary | ICD-10-CM

## 2010-12-11 DIAGNOSIS — Z01810 Encounter for preprocedural cardiovascular examination: Secondary | ICD-10-CM

## 2010-12-11 HISTORY — PX: CARDIAC CATHETERIZATION: SHX172

## 2010-12-11 LAB — BASIC METABOLIC PANEL
Calcium: 9.1 mg/dL (ref 8.4–10.5)
GFR calc Af Amer: >60 mL/min (ref >60)
GFR calc non Af Amer: >60 mL/min (ref >60)
Glucose, Bld: 250 mg/dL — ABNORMAL HIGH (ref 70–99)
Potassium: 3.4 mEq/L — ABNORMAL LOW (ref 3.5–5.1)
Sodium: 136 mEq/L (ref 135–145)

## 2010-12-11 LAB — GLUCOSE, CAPILLARY
Glucose-Capillary: 284 mg/dL — ABNORMAL HIGH (ref 70–99)
Glucose-Capillary: 204 mg/dL — ABNORMAL HIGH (ref 70–99)
Glucose-Capillary: 167 mg/dL — ABNORMAL HIGH (ref 70–99)
Glucose-Capillary: 183 mg/dL — ABNORMAL HIGH (ref 70–99)
Glucose-Capillary: 207 mg/dL — ABNORMAL HIGH (ref 70–99)

## 2010-12-11 LAB — CBC
Hemoglobin: 12.9 g/dL — ABNORMAL LOW (ref 13.0–17.0)
MCH: 28.3 pg (ref 26.0–34.0)
Platelets: 232 10*3/uL (ref 150–400)
RBC: 4.56 MIL/uL (ref 4.22–5.81)
WBC: 10 10*3/uL (ref 4.0–10.5)

## 2010-12-11 LAB — PROTIME-INR: INR: 1.45 (ref 0.00–1.49)

## 2010-12-11 LAB — HEPARIN LEVEL (UNFRACTIONATED): Heparin Unfractionated: 0.37 IU/mL (ref 0.30–0.70)

## 2010-12-12 DIAGNOSIS — I2510 Atherosclerotic heart disease of native coronary artery without angina pectoris: Secondary | ICD-10-CM

## 2010-12-12 LAB — CBC
HCT: 35.8 % — ABNORMAL LOW (ref 39.0–52.0)
Hemoglobin: 12.2 g/dL — ABNORMAL LOW (ref 13.0–17.0)
MCV: 83.3 fL (ref 78.0–100.0)
RBC: 4.3 MIL/uL (ref 4.22–5.81)
RDW: 14.2 % (ref 11.5–15.5)
WBC: 8.5 10*3/uL (ref 4.0–10.5)

## 2010-12-12 LAB — BASIC METABOLIC PANEL
CO2: 25 mEq/L (ref 19–32)
Chloride: 102 mEq/L (ref 96–112)
Creatinine, Ser: 0.93 mg/dL (ref 0.50–1.35)
GFR calc Af Amer: >60 mL/min (ref >60)
Sodium: 137 mEq/L (ref 135–145)

## 2010-12-12 LAB — GLUCOSE, CAPILLARY
Glucose-Capillary: 185 mg/dL — ABNORMAL HIGH (ref 70–99)
Glucose-Capillary: 257 mg/dL — ABNORMAL HIGH (ref 70–99)
Glucose-Capillary: 245 mg/dL — ABNORMAL HIGH (ref 70–99)
Glucose-Capillary: 260 mg/dL — ABNORMAL HIGH (ref 70–99)

## 2010-12-12 LAB — PROTIME-INR
INR: 1.26 (ref 0.00–1.49)
Prothrombin Time: 16.1 seconds — ABNORMAL HIGH (ref 11.6–15.2)

## 2010-12-12 LAB — FOLATE: Folate: 9.9 ng/mL

## 2010-12-12 LAB — HEPARIN LEVEL (UNFRACTIONATED): Heparin Unfractionated: 0.51 IU/mL (ref 0.30–0.70)

## 2010-12-12 LAB — POCT ACTIVATED CLOTTING TIME: Activated Clotting Time: 94 seconds

## 2010-12-12 LAB — VITAMIN B12: Vitamin B-12: 314 pg/mL (ref 211–911)

## 2010-12-12 LAB — FERRITIN: Ferritin: 129 ng/mL (ref 22–322)

## 2010-12-13 ENCOUNTER — Inpatient Hospital Stay (HOSPITAL_COMMUNITY): Payer: Medicare Other

## 2010-12-13 LAB — BASIC METABOLIC PANEL
Calcium: 8.9 mg/dL (ref 8.4–10.5)
GFR calc Af Amer: >60 mL/min (ref >60)
GFR calc non Af Amer: >60 mL/min (ref >60)
Glucose, Bld: 194 mg/dL — ABNORMAL HIGH (ref 70–99)
Sodium: 137 mEq/L (ref 135–145)

## 2010-12-13 LAB — HEMOGLOBIN A1C: Hgb A1c MFr Bld: 9.8 % — ABNORMAL HIGH (ref <5.7)

## 2010-12-13 LAB — APTT: aPTT: 93 seconds — ABNORMAL HIGH (ref 24–37)

## 2010-12-13 LAB — CBC
MCHC: 34 g/dL (ref 30.0–36.0)
Platelets: 216 10*3/uL (ref 150–400)
RDW: 14.1 % (ref 11.5–15.5)

## 2010-12-13 LAB — GLUCOSE, CAPILLARY: Glucose-Capillary: 313 mg/dL — ABNORMAL HIGH (ref 70–99)

## 2010-12-13 LAB — PROTIME-INR: Prothrombin Time: 14.9 seconds (ref 11.6–15.2)

## 2010-12-14 LAB — BASIC METABOLIC PANEL
BUN: 17 mg/dL (ref 6–23)
CO2: 28 mEq/L (ref 19–32)
Chloride: 101 mEq/L (ref 96–112)
Creatinine, Ser: 1.08 mg/dL (ref 0.50–1.35)

## 2010-12-14 LAB — CBC
HCT: 34.4 % — ABNORMAL LOW (ref 39.0–52.0)
Hemoglobin: 11.5 g/dL — ABNORMAL LOW (ref 13.0–17.0)
MCH: 28 pg (ref 26.0–34.0)
MCHC: 33.4 g/dL (ref 30.0–36.0)

## 2010-12-14 LAB — PROTIME-INR: Prothrombin Time: 14 seconds (ref 11.6–15.2)

## 2010-12-15 ENCOUNTER — Inpatient Hospital Stay (HOSPITAL_COMMUNITY): Payer: Medicare Other

## 2010-12-15 DIAGNOSIS — I2510 Atherosclerotic heart disease of native coronary artery without angina pectoris: Secondary | ICD-10-CM

## 2010-12-15 HISTORY — PX: CORONARY ARTERY BYPASS GRAFT: SHX141

## 2010-12-15 LAB — MAGNESIUM: Magnesium: 2.7 mg/dL — ABNORMAL HIGH (ref 1.5–2.5)

## 2010-12-15 LAB — CBC
HCT: 33.6 % — ABNORMAL LOW (ref 39.0–52.0)
HCT: 35.3 % — ABNORMAL LOW (ref 39.0–52.0)
Hemoglobin: 11.6 g/dL — ABNORMAL LOW (ref 13.0–17.0)
Hemoglobin: 11.9 g/dL — ABNORMAL LOW (ref 13.0–17.0)
MCH: 28.4 pg (ref 26.0–34.0)
MCH: 28.9 pg (ref 26.0–34.0)
MCH: 28.5 pg (ref 26.0–34.0)
MCHC: 34.5 g/dL (ref 30.0–36.0)
MCHC: 33.7 g/dL (ref 30.0–36.0)
MCV: 83.6 fL (ref 78.0–100.0)
MCV: 84.4 fL (ref 78.0–100.0)
Platelets: 278 10*3/uL (ref 150–400)
Platelets: 192 10*3/uL (ref 150–400)
Platelets: 227 10*3/uL (ref 150–400)
RBC: 4.12 MIL/uL — ABNORMAL LOW (ref 4.22–5.81)
RBC: 4.02 MIL/uL — ABNORMAL LOW (ref 4.22–5.81)
RBC: 4.18 MIL/uL — ABNORMAL LOW (ref 4.22–5.81)
RDW: 14.2 % (ref 11.5–15.5)
RDW: 14.1 % (ref 11.5–15.5)
WBC: 6 10*3/uL (ref 4.0–10.5)
WBC: 9 10*3/uL (ref 4.0–10.5)
WBC: 14.5 10*3/uL — ABNORMAL HIGH (ref 4.0–10.5)

## 2010-12-15 LAB — POCT I-STAT 4, (NA,K, GLUC, HGB,HCT)
Glucose, Bld: 164 mg/dL — ABNORMAL HIGH (ref 70–99)
Glucose, Bld: 134 mg/dL — ABNORMAL HIGH (ref 70–99)
HCT: 26 % — ABNORMAL LOW (ref 39.0–52.0)
HCT: 33 % — ABNORMAL LOW (ref 39.0–52.0)
Hemoglobin: 11.9 g/dL — ABNORMAL LOW (ref 13.0–17.0)
Hemoglobin: 8.8 g/dL — ABNORMAL LOW (ref 13.0–17.0)
Hemoglobin: 9.9 g/dL — ABNORMAL LOW (ref 13.0–17.0)
Potassium: 3.7 mEq/L (ref 3.5–5.1)
Potassium: 4.1 mEq/L (ref 3.5–5.1)
Potassium: 4.1 mEq/L (ref 3.5–5.1)
Sodium: 138 mEq/L (ref 135–145)
Sodium: 138 mEq/L (ref 135–145)

## 2010-12-15 LAB — POCT I-STAT 3, ART BLOOD GAS (G3+)
Acid-base deficit: 2 mmol/L (ref 0.0–2.0)
Bicarbonate: 25.1 mEq/L — ABNORMAL HIGH (ref 20.0–24.0)
O2 Saturation: 96 %
O2 Saturation: 91 %
Patient temperature: 36.7
TCO2: 26 mmol/L (ref 0–100)
pH, Arterial: 7.413 (ref 7.350–7.450)

## 2010-12-15 LAB — POCT I-STAT, CHEM 8
BUN: 14 mg/dL (ref 6–23)
Calcium, Ion: 1.17 mmol/L (ref 1.12–1.32)
Glucose, Bld: 280 mg/dL — ABNORMAL HIGH (ref 70–99)
TCO2: 21 mmol/L (ref 0–100)

## 2010-12-15 LAB — PROTIME-INR
INR: 1 (ref 0.00–1.49)
INR: 1.15 (ref 0.00–1.49)
Prothrombin Time: 13.4 seconds (ref 11.6–15.2)
Prothrombin Time: 14.9 seconds (ref 11.6–15.2)

## 2010-12-15 LAB — POCT I-STAT GLUCOSE
Glucose, Bld: 222 mg/dL — ABNORMAL HIGH (ref 70–99)
Operator id: 173792

## 2010-12-15 LAB — HEPARIN LEVEL (UNFRACTIONATED): Heparin Unfractionated: 0.64 IU/mL (ref 0.30–0.70)

## 2010-12-15 LAB — CREATININE, SERUM
Creatinine, Ser: 0.96 mg/dL (ref 0.50–1.35)
GFR calc Af Amer: >60 mL/min (ref >60)
GFR calc non Af Amer: >60 mL/min (ref >60)

## 2010-12-15 LAB — GLUCOSE, CAPILLARY: Glucose-Capillary: 168 mg/dL — ABNORMAL HIGH (ref 70–99)

## 2010-12-15 LAB — HEMOGLOBIN AND HEMATOCRIT, BLOOD: Hemoglobin: 9.4 g/dL — ABNORMAL LOW (ref 13.0–17.0)

## 2010-12-15 LAB — APTT: aPTT: 35 seconds (ref 24–37)

## 2010-12-16 ENCOUNTER — Inpatient Hospital Stay (HOSPITAL_COMMUNITY): Payer: Medicare Other

## 2010-12-16 DIAGNOSIS — E1165 Type 2 diabetes mellitus with hyperglycemia: Secondary | ICD-10-CM

## 2010-12-16 DIAGNOSIS — IMO0001 Reserved for inherently not codable concepts without codable children: Secondary | ICD-10-CM

## 2010-12-16 LAB — GLUCOSE, CAPILLARY
Glucose-Capillary: 108 mg/dL — ABNORMAL HIGH (ref 70–99)
Glucose-Capillary: 108 mg/dL — ABNORMAL HIGH (ref 70–99)
Glucose-Capillary: 112 mg/dL — ABNORMAL HIGH (ref 70–99)
Glucose-Capillary: 166 mg/dL — ABNORMAL HIGH (ref 70–99)
Glucose-Capillary: 194 mg/dL — ABNORMAL HIGH (ref 70–99)
Glucose-Capillary: 236 mg/dL — ABNORMAL HIGH (ref 70–99)
Glucose-Capillary: 251 mg/dL — ABNORMAL HIGH (ref 70–99)
Glucose-Capillary: 257 mg/dL — ABNORMAL HIGH (ref 70–99)
Glucose-Capillary: 70 mg/dL (ref 70–99)
Glucose-Capillary: 79 mg/dL (ref 70–99)
Glucose-Capillary: 97 mg/dL (ref 70–99)

## 2010-12-16 LAB — CBC
HCT: 34.3 % — ABNORMAL LOW (ref 39.0–52.0)
HCT: 36.7 % — ABNORMAL LOW (ref 39.0–52.0)
Hemoglobin: 11.4 g/dL — ABNORMAL LOW (ref 13.0–17.0)
Hemoglobin: 12.1 g/dL — ABNORMAL LOW (ref 13.0–17.0)
MCH: 28.1 pg (ref 26.0–34.0)
MCH: 28.2 pg (ref 26.0–34.0)
MCHC: 33.2 g/dL (ref 30.0–36.0)
MCHC: 33 g/dL (ref 30.0–36.0)
MCV: 84.5 fL (ref 78.0–100.0)
MCV: 85.5 fL (ref 78.0–100.0)
Platelets: 231 10*3/uL (ref 150–400)
Platelets: 226 10*3/uL (ref 150–400)
RBC: 4.06 MIL/uL — ABNORMAL LOW (ref 4.22–5.81)
RBC: 4.29 MIL/uL (ref 4.22–5.81)
RDW: 14.4 % (ref 11.5–15.5)
RDW: 14.8 % (ref 11.5–15.5)
WBC: 12.9 10*3/uL — ABNORMAL HIGH (ref 4.0–10.5)
WBC: 13.9 10*3/uL — ABNORMAL HIGH (ref 4.0–10.5)

## 2010-12-16 LAB — POCT I-STAT, CHEM 8
Calcium, Ion: 1.19 mmol/L (ref 1.12–1.32)
Hemoglobin: 12.6 g/dL — ABNORMAL LOW (ref 13.0–17.0)
Sodium: 134 mEq/L — ABNORMAL LOW (ref 135–145)
TCO2: 27 mmol/L (ref 0–100)

## 2010-12-16 LAB — BASIC METABOLIC PANEL
BUN: 16 mg/dL (ref 6–23)
CO2: 25 mEq/L (ref 19–32)
Calcium: 8.4 mg/dL (ref 8.4–10.5)
Chloride: 103 mEq/L (ref 96–112)
Creatinine, Ser: 1.08 mg/dL (ref 0.50–1.35)
GFR calc Af Amer: >60 mL/min (ref >60)
GFR calc non Af Amer: >60 mL/min (ref >60)
Glucose, Bld: 146 mg/dL — ABNORMAL HIGH (ref 70–99)
Potassium: 4.1 mEq/L (ref 3.5–5.1)
Sodium: 135 mEq/L (ref 135–145)

## 2010-12-16 LAB — MAGNESIUM
Magnesium: 2.6 mg/dL — ABNORMAL HIGH (ref 1.5–2.5)
Magnesium: 2.5 mg/dL (ref 1.5–2.5)

## 2010-12-16 LAB — CREATININE, SERUM
Creatinine, Ser: 1.04 mg/dL (ref 0.50–1.35)
GFR calc Af Amer: >60 mL/min (ref >60)
GFR calc non Af Amer: >60 mL/min (ref >60)

## 2010-12-17 ENCOUNTER — Inpatient Hospital Stay (HOSPITAL_COMMUNITY): Payer: Medicare Other

## 2010-12-17 LAB — GLUCOSE, CAPILLARY
Glucose-Capillary: 140 mg/dL — ABNORMAL HIGH (ref 70–99)
Glucose-Capillary: 171 mg/dL — ABNORMAL HIGH (ref 70–99)
Glucose-Capillary: 192 mg/dL — ABNORMAL HIGH (ref 70–99)
Glucose-Capillary: 196 mg/dL — ABNORMAL HIGH (ref 70–99)

## 2010-12-17 LAB — BASIC METABOLIC PANEL
BUN: 17 mg/dL (ref 6–23)
CO2: 27 mEq/L (ref 19–32)
Calcium: 8.8 mg/dL (ref 8.4–10.5)
Chloride: 102 mEq/L (ref 96–112)
Creatinine, Ser: 0.92 mg/dL (ref 0.50–1.35)
GFR calc Af Amer: >60 mL/min (ref >60)
GFR calc non Af Amer: >60 mL/min (ref >60)
Glucose, Bld: 144 mg/dL — ABNORMAL HIGH (ref 70–99)
Potassium: 4.3 mEq/L (ref 3.5–5.1)
Sodium: 135 mEq/L (ref 135–145)

## 2010-12-17 LAB — CBC
HCT: 34.5 % — ABNORMAL LOW (ref 39.0–52.0)
Hemoglobin: 11.5 g/dL — ABNORMAL LOW (ref 13.0–17.0)
MCH: 28.5 pg (ref 26.0–34.0)
MCHC: 33.3 g/dL (ref 30.0–36.0)
MCV: 85.4 fL (ref 78.0–100.0)
Platelets: 222 10*3/uL (ref 150–400)
RBC: 4.04 MIL/uL — ABNORMAL LOW (ref 4.22–5.81)
RDW: 14.7 % (ref 11.5–15.5)
WBC: 11.5 10*3/uL — ABNORMAL HIGH (ref 4.0–10.5)

## 2010-12-17 NOTE — Cardiovascular Report (Signed)
NAMEORDELL, ZERR NO.:  [id number]  MEDICAL RECORD NO.:  [id number]  LOCATION:  2918                         FACILITY:  [facility]  PHYSICIAN:  Jeanella Craze. Little, M.D. DATE OF BIRTH:  [birth year]  DATE OF PROCEDURE:  12/11/2010 DATE OF DISCHARGE:                           CARDIAC CATHETERIZATION   INDICATIONS FOR TEST:  This 77 year old male was admitted with unstable angina.  He has negative cardiac markers.  He was on Coumadin for atrial fibrillation and he was admitted with an INR of 2.1.  It took 48 hours for his INR become less than 1.7, so that we could safely cath him.  He has a history of hypertension, which has been difficult to manage and diabetes mellitus.  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  PROCEDURE:  The patient was prepped and draped in the usual sterile fashion exposing the right groin.  Following local anesthetic with 1% Xylocaine, the Seldinger technique was employed.  It took three times to entering the femoral artery to get my guidewire to pass.  A 5-French introducer sheath was placed in the right femoral artery, and left and right coronary arteriography, evaluation of his renal arteries for renal artery stenosis because of the severe hypertension and a distal aortogram was performed.  COMPLICATIONS:  None.  TOTAL CONTRAST USED:  140 mL.  EQUIPMENT:  The 5-French Judkins and configuration catheters, but it took a JL-5 guide catheter to cannulate the left main.  RESULTS: 1. Hemodynamic monitoring:  His central aortic pressure was 164/84 and     his left ventricular pressure was 164/3 with no significant valve     gradient noted at the time of pullback. 2. Ventriculography.  Ventriculography in the RAO projection revealed     normal LV systolic functions with no wall motion abnormalities.     His ejection fraction was greater than 55%.  The end-diastolic     pressure was 10. 3. Renal arteries:  Engagement  of the right and left renal arteries     using a right coronary catheter was accomplished.  There was no     evidence of renal artery stenosis. 4. Distal aortogram.  A distal aortogram done around the level of the     renal artery, showed no abdominal aortic aneurysm and widely patent     iliacs.  CORONARY ARTERIOGRAPHY: 1. Left main normal.  It trifurcated. 2. LAD:  Almost at the ostium of the LAD was an eccentric hyperlucent     70% area of narrowing.  It was too close to the left main for a     landing zone for a stent.  The diagonal came off just distal to     this area of obstruction.  The remainder of the LAD diagonal system     was free of disease. 3. Right coronary artery.  This was a codominant vessel with only mild     irregularities particularly in the first OM, but there were no high-     grade areas of stenosis. 4. Optional diagonal free of disease. 5. Right coronary artery:  Right coronary artery was subtotaled     proximally,  but still with TIMI 3 flow distally.  There was also a     60% area of narrowing just proximal to the PDA. CONCLUSION: 1. Normal LV systolic function. 2. No evidence of renal artery stenosis. 3. No evidence of distal aortogram or iliac disease. 4. Severe two-vessel disease with subtotaled right coronary artery     proximally and a distal 60% lesion and a complex 70% area of     narrowing in the ostium of the LAD.  DISCUSSION:  The LAD lesion is too close to the left main to safely stent this and if I did, I would have to clearly jail the takeoff of a large diagonal vessel.  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  In the interim, I plan to keep him on an IV heparin and IV nitroglycerin.          ______________________________ Jeanella Craze. Little, M.D.     ABL/MEDQ  D:  12/11/2010  T:  12/11/2010  Job:   [id number]  cc:   Daiva Eves, MD [facility]  Electronically Signed by Chase Picket M.D. on 12/17/2010 01:27:33 PM

## 2010-12-18 ENCOUNTER — Inpatient Hospital Stay (HOSPITAL_COMMUNITY): Payer: Medicare Other

## 2010-12-18 DIAGNOSIS — E119 Type 2 diabetes mellitus without complications: Secondary | ICD-10-CM

## 2010-12-18 LAB — BASIC METABOLIC PANEL
BUN: 22 mg/dL (ref 6–23)
CO2: 28 mEq/L (ref 19–32)
Calcium: 8.6 mg/dL (ref 8.4–10.5)
Chloride: 100 mEq/L (ref 96–112)
Creatinine, Ser: 0.99 mg/dL (ref 0.50–1.35)
GFR calc Af Amer: >60 mL/min (ref >60)
GFR calc non Af Amer: >60 mL/min (ref >60)
Glucose, Bld: 152 mg/dL — ABNORMAL HIGH (ref 70–99)
Potassium: 4 mEq/L (ref 3.5–5.1)
Sodium: 135 mEq/L (ref 135–145)

## 2010-12-18 LAB — CBC
HCT: 31.9 % — ABNORMAL LOW (ref 39.0–52.0)
Hemoglobin: 10.6 g/dL — ABNORMAL LOW (ref 13.0–17.0)
MCH: 28.4 pg (ref 26.0–34.0)
MCHC: 33.2 g/dL (ref 30.0–36.0)
MCV: 85.5 fL (ref 78.0–100.0)
Platelets: 238 10*3/uL (ref 150–400)
RBC: 3.73 MIL/uL — ABNORMAL LOW (ref 4.22–5.81)
RDW: 14.5 % (ref 11.5–15.5)
WBC: 11.1 10*3/uL — ABNORMAL HIGH (ref 4.0–10.5)

## 2010-12-18 LAB — GLUCOSE, CAPILLARY
Glucose-Capillary: 300 mg/dL — ABNORMAL HIGH (ref 70–99)
Glucose-Capillary: 146 mg/dL — ABNORMAL HIGH (ref 70–99)

## 2010-12-19 LAB — GLUCOSE, CAPILLARY: Glucose-Capillary: 215 mg/dL — ABNORMAL HIGH (ref 70–99)

## 2010-12-20 LAB — GLUCOSE, CAPILLARY
Glucose-Capillary: 234 mg/dL — ABNORMAL HIGH (ref 70–99)
Glucose-Capillary: 171 mg/dL — ABNORMAL HIGH (ref 70–99)
Glucose-Capillary: 200 mg/dL — ABNORMAL HIGH (ref 70–99)

## 2010-12-20 LAB — HEMOGLOBIN AND HEMATOCRIT, BLOOD
HCT: 33.5 % — ABNORMAL LOW (ref 39.0–52.0)
Hemoglobin: 11.3 g/dL — ABNORMAL LOW (ref 13.0–17.0)

## 2010-12-20 LAB — PROTIME-INR
INR: 1.11 (ref 0.00–1.49)
Prothrombin Time: 14.5 seconds (ref 11.6–15.2)

## 2010-12-24 NOTE — Op Note (Signed)
  NAMECASH, BRIERLEY NO.:  [id number]  MEDICAL RECORD NO.:  [id number]  LOCATION:  2399                         FACILITY:  [facility]  PHYSICIAN:  Marshall Cork. Jeffie Pollock, M.D.    DATE OF BIRTH:  1931/04/21  DATE OF PROCEDURE:  12/15/2010 DATE OF DISCHARGE:                              OPERATIVE REPORT   Patient of Dr. Lanelle Bal.  PROCEDURE:  Cystoscopy with urethral dilation and placement of Foley catheter.  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  SURGEON:  Marshall Cork. Jeffie Pollock, MD  ANESTHESIA:  General.  SPECIMEN:  None.  DRAINS:  A 18-French Council catheter.  COMPLICATIONS:  None.  INDICATIONS:  Mr. Galluccio is a 77 year old white male who is to undergo coronary bypass today made, at beginning of the procedure an attempt was made to place Foley, but was unsuccessful due to the patient's severe balanitis xerotica obliterans.  I was asked to see the patient.  FINDINGS AND PROCEDURE:  He was in the supine position under general anesthetic in preparation for surgery.  His genitalia was prepped with Betadine solution, and draped with sterile towels.  Initially, the meatus was not easy to identify, but eventually I found the meatus and was able to dilate it to about 12-French with pediatric sound.  I then used [employer] sounds to dilated to 20-French and then passed the 16- [place] flexible scope.  Examination showed revealed disrupted stricture the distal 2 cm the urethra more proximally urethra was unremarkable. The external sphincter was intact.  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  Examination of the bladder revealed mild trabeculation. No tumors or stones were seen.  Ureteral orifices were unremarkable.  A guidewire was then  passed through the scope to the bladder.  The scope was removed and 18-French Council catheter was passed over the wire without difficulty.  The balloon was filled with 10 mL of sterile fluid and.  The catheter was placed to straight drainage.  Dr. Servando Snare then proceeded with coronary artery bypass grafting.  There were no complications.     Marshall Cork. Jeffie Pollock, M.D.     JJW/MEDQ  D:  12/15/2010  T:  12/15/2010  Job:  [id number]  Electronically Signed by Irine Seal M.D. on 12/24/2010 10:29:34 AM

## 2010-12-29 ENCOUNTER — Other Ambulatory Visit: Payer: Self-pay | Admitting: Thoracic Surgery (Cardiothoracic Vascular Surgery)

## 2010-12-29 ENCOUNTER — Ambulatory Visit
Admission: RE | Admit: 2010-12-29 | Discharge: 2010-12-29 | Disposition: A | Discharge status: Home / Self Care | Payer: Medicare Other | Source: Ambulatory Visit | Attending: Thoracic Surgery (Cardiothoracic Vascular Surgery) | Admitting: Thoracic Surgery (Cardiothoracic Vascular Surgery)

## 2010-12-29 ENCOUNTER — Encounter (INDEPENDENT_AMBULATORY_CARE_PROVIDER_SITE_OTHER): Payer: Self-pay | Admitting: Cardiothoracic Surgery

## 2010-12-29 DIAGNOSIS — Z951 Presence of aortocoronary bypass graft: Secondary | ICD-10-CM

## 2010-12-29 DIAGNOSIS — R0602 Shortness of breath: Secondary | ICD-10-CM

## 2010-12-29 DIAGNOSIS — I2510 Atherosclerotic heart disease of native coronary artery without angina pectoris: Secondary | ICD-10-CM

## 2010-12-29 NOTE — Discharge Summary (Signed)
[name], BLANKE NO.:  [id number]  MEDICAL RECORD NO.:  [id number]  LOCATION:  2013                         FACILITY:  [place]  PHYSICIAN:  Lanelle Bal, MD    DATE OF BIRTH:  [birth year]  DATE OF ADMISSION:  12/09/2010 DATE OF DISCHARGE:                              DISCHARGE SUMMARY   ADMITTING DIAGNOSES: 1. Multivessel coronary artery disease (ejection fraction of 55%). 2. History of paroxysmal atrial fibrillation. 3. History of diabetes mellitus type 2. 4. History of remote tobacco abuse. 5. History of hyperlipidemia. 6. History of hypertension. 7. History of obstructive sleep apnea. 8. Non-ST segment elevation myocardial infarction.  DISCHARGE DIAGNOSES: 1. Multivessel coronary artery disease (ejection fraction of 55%). 2. History of paroxysmal atrial fibrillation. 3. History of diabetes mellitus type 2. 4. History of remote tobacco abuse. 5. History of hyperlipidemia. 6. History of hypertension. 7. History of obstructive sleep apnea. 8. Acute blood loss anemia. 9. 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 10.Volume overload. 11.Non-ST segment elevation myocardial infarction.  PROCEDURES: 1. Cardiac catheterization performed by Dr. Rex Kras on December 11, 2010. 2. Median sternotomy for CABG x2 (LIMA to LAD, SVG to posterior     descending coronary artery with bilateral thigh EVH by Dr. Servando Snare     on December 15, 2010. 3. Cystoscopy with urethral dilatation and placement of Foley catheter     by Dr. Jeffie Pollock on December 15, 2010.  HISTORY OF PRESENTING ILLNESS:  This is a 77 year old male who 3-4 weeks prior to this admission began experiencing progressive episodes of chest tightness (nonradiating) that was associated with faintness and diaphoresis.  These episodes have become progressively severe both in intensity and frequency.  He presented to [facility] Emergency Room for further evaluation and treatment on December 09, 2010.  Initial cardiac enzymes showed troponin less than 0.3, CK 47 and CK-MB 1.6.  The patient underwent a cardiac catheterization by Dr. Rex Kras on December 11, 2010.  EF was found to be greater than 55%.  No evidence of renal artery stenosis, 70% narrowing at the ostomy of the LAD (too close to the left main for a stent), subtotal right coronary artery proximally and a distal 60% lesion.  A cardiothoracic consultation was obtained with Dr. Servando Snare for the consideration of coronary artery bypass grafting surgery. Potential risks, complications and benefits of the surgery were discussed with the patient and he agreed to proceed.  The patient did undergo duplex carotid ultrasound prior to undergoing the aforementioned CABG x2.  Results indicated that no evidence of internal carotid artery stenosis.  In addition, ABIs that were done showed 1.15 on the right and 1.40 on the left.  BRIEF HOSPITAL COURSE STAY:  It should be noted that prior to undergoing the median sternotomy for CABG x2, a Foley was unable to be placed and required a Urology consult.  Dr. Jeffie Pollock performed a cystoscopy with urethral dilatation and placement of Foley catheter.        The patient initially was A-paced.  He remained afebrile and hemodynamically stable. As previously stated, the patient had a history of paroxysmal atrial fibrillation; however, he did maintain sinus  rhythm postoperatively.  He will be continued on Amiodarone as well as Coumadin.  The patient's A- line, Swan-Ganz, chest tubes were all removed early in his postoperative course.  His Foley did remain for several days postoperatively secondary to his balanitis xerotica obliterans with medial stenosis and distal stricture.  His Foley was removed on December 18, 2010, and he is currently voiding on his own without difficulty.  He was found to have acute blood loss anemia postoperatively.  His H and H went as low as 10.6 and 31.9. The patient is a Sales promotion account executive  Witness and a thorough discussion was had with the patient prior to undergoing any surgical procedure of the risks and potential complications of refusing a transfusion and blood products  (provided he need of these).  He was started on oral iron, which will be continued as an outpatient and obviously did not receive any postoperative transfusion.  The patient was found to be volume overloaded and diuresed accordingly.  He was also started on a beta- blocker, which was titrated accordingly and he was restarted on his losartan and amlodipine secondary to hypertension.  He also required a Catapres patch in order to better control his blood pressure as well. He was felt surgically stable for transfer from the Intensive Care Unit to [facility] on December 17, 2010.  He continued to progress with cardiac rehab and currently on postoperative day #4, he has been tolerating a diet. He is feeling fairly well and again, he has not had any difficulty voiding after his removal of his Foley.  His vital signs are as follows: He is afebrile, heart rate in the 60, blood pressure 134/69, O2 sat 92% on room air.  CBGs are 256, 146, 215 and 170 respectively.  It should be noted that the patient preoperatively was on metformin 1000 mg p.o. two times daily as well as Amaryl 4 mg p.o. two times daily.  However, his preoperative hemoglobin A1c was found to be 9.8 and he is still currently on insulin.  He will need to follow up with his medical doctor, Dr. Lisbeth Ply for further diabetes management.  PHYSICAL EXAMINATION:  CARDIOVASCULAR:  Regular rate and rhythm. LUNGS:  Clear. EXTREMITIES:  Trace lower extremity edema.  Sternal and lower extremity wounds are clean, dry and continuing to heal, provided he remains afebrile, hemodynamically stable, and pending morning round evaluation, and will be surgically stable for discharge on December 20, 2010.  Latest laboratory studies are as follows:  BMET done on December 18, 2010; potassium 4, sodium 135, BUN and creatinine 22 and 0.99 respectively. CBC on this date; H and H 10.6 and 31.9, white blood cell count down to 11,100, platelet count 238,000.  Chest x-ray done on this date showed scattered patchy atelectasis, no pneumothorax and stable cardiomegaly.  Discharge instructions include the following:  DIET:  Low-sodium, heart-healthy, diabetic diet.  ACTIVITY:  He may walk up steps.  He may shower.  He is not to lift more than 10 pounds for 4 weeks.  He is not to drive until after 4 weeks.  He is to continue with his breathing exercises daily.  He is to walk daily and increase his frequent and duration as tolerates.  FOLLOWUP APPOINTMENT: 1. The patient needs to contact Dr. Eddie Dibbles office for a followup     appointment in 2 weeks. 2. The patient needs to have a PT and INR drawn 48 hours after his     discharge from [facility].  He  needs to contact Dr.     Eddie Dibbles office for an appointment time. 3. The patient has an appointment to see Dr. Servando Snare on January 22, 2011 at 10 a.m.  45 minutes prior to this office appointment, a     chest x-ray will be obtained. 4..  The patient needs to contact his medical doctor, Dr. Lisbeth Ply regarding further diabetes management.  DISCHARGE MEDICATIONS AT THE TIME OF DICTATION: 1. Amiodarone 400 mg p.o. two times daily for 3 days.  On December 23, 2010, the patient is to take amiodarone 200 mg p.o. two times daily     thereafter. 2. Catapres patch 0.2 mg/24 hours transdermally weekly. 3. Ferrous sulfate 325 mg p.o. three times daily. 4. Folic acid 1 mg p.o. daily. 5. Lasix 40 mg p.o. daily x5 days. 6. Potassium chloride 20 mEq p.o. daily x5 days. 7. Zocor 20 mg p.o. at bedtime. 8. Ultram 50 mg one tablet p.o. q. 4-6 h. p.r.n. pain. 9. Losartan 50 mg p.o. daily. 10.Lopressor 25 mg p.o. two times daily. 11.Acarbose 25 mg p.o. two times daily. 12.Amlodipine 10 mg p.o. q.p.m. 13.Enteric-coated  aspirin 81 mg p.o. daily. 14.CoQ10 over-the-counter p.o. twice daily. 15.Amaryl 4 mg p.o. two times daily. 16.Metformin 1000 mg p.o. two times daily. 17.Coumadin 5 mg every Tuesday, Thursday, Saturday and Sundays; 7.5 mg     p.o. every Monday, Wednesday and Friday or as directed by Dr.     Eddie Dibbles office.     [name] Pinks, PA   ______________________________ Lanelle Bal, MD    DZ/MEDQ  D:  12/19/2010  T:  12/20/2010  Job:  [id number]  cc:   Jeanella Craze. Little, M.D. Daiva Eves, MD  Electronically Edited and Signed  By [name] Pinks PA on 12/24/2010 09:20:55 AM Electronically Signed by Lanelle Bal MD on 12/29/2010 07:32:18 AM
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
## 2010-12-29 NOTE — Op Note (Signed)
[name], [name] NO.:  [id number]  MEDICAL RECORD NO.:  [id number]  LOCATION:  2312                         FACILITY:  [place]  PHYSICIAN:  Lanelle Bal, MD    DATE OF BIRTH:  14-Jun-1930  DATE OF PROCEDURE:  12/15/2010 DATE OF DISCHARGE:                              OPERATIVE REPORT   PREOPERATIVE DIAGNOSIS:  Coronary occlusive disease with recent non- STEMI myocardial infarction in patient who is a Jehovah's Witness.  POSTOPERATIVE DIAGNOSIS:  Coronary occlusive disease with recent non- STEMI myocardial infarction in patient who is a Jehovah's Witness.  SURGICAL PROCEDURES:  Coronary artery bypass grafting x2 with left internal mammary to left anterior descending coronary artery and reverse saphenous vein graft to the posterior descending coronary artery with bilateral thigh vein endovein harvesting.  SURGEON:  Lanelle Bal, MD.  FIRST ASSISTANT:  Doroteo Bradford, PA.  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  He presented with new onset of angina with elevation of CK-MBs and troponin, stabilized medically, underwent cardiac catheterization by Dr. Rex Kras, which demonstrated proximal LAD lesion of 70%-80%, ostial right coronary lesion of 95%, and an 80% proximal posterior descending lesion, the circ was relatively free of disease.  Overall ventricular function was preserved.  The patient's hemoglobin adequate were approximately 36 and after cath drop to 34 because of his anatomy has been maintained on heparin.  Risks and options, particularly the increased risks of lack of use of blood products, if the patient did have bleeding difficulties was discussed with him in detail and he was willing to proceed, and signed informed consent, and in addition signed specific consent concerning blood  products and other medical devices that he is willing to accept.  DESCRIPTION OF PROCEDURE:  With Swan-Ganz and arterial line monitors in place, the patient underwent general endotracheal anesthesia without incidence.  Skin of chest and legs was prepped with Betadine and draped in the usual sterile manner.  Unfortunately, the procedure was delayed because there was difficulty in placing his Foley catheter as he had significant urethral strictures.  Dr. Jeffie Pollock urologist on call was asked to come to the operating room and under separate note as dictated the placement of the Foley catheter in conjunction with cystoscopy.  Once Foley was in place, there was good drainage of urine.  Using the [employer] endovein harvesting system, a segment of vein was harvested from the right thigh.  Unfortunately, after removing the segment of vein, the middle portion of it would not dilate and was not usable.  Additional vein was then harvested from the left thigh and was of much better quality and caliber.  Median sternotomy was performed.  Left internal mammary artery was dissected down as pedicle graft.  The distal artery was divided and had good free flow.  Pericardium was opened.  Overall, ventricular function appeared preserved.  The patient was systemically heparinized.  Consideration for off-pump bypass was deemed not feasible primarily because of the extensive disease through the distal right coronary artery and into the posterior descending and with  the size of both the patient and his heart made it difficult to be satisfied that good anastomosis could be performed on the right coronary artery.  The patient's ascending aorta was cannulated.  The right atrium was cannulated and aortic root vent cardioplegia needle was introduced into the ascending aorta.  The patient was placed on cardiopulmonary bypass 2.4 liters per minute per meter squared.  Sites of anastomosis were selected and dissected out the  epicardium.  The patient body temperature was cooled to 32 degrees.  Aortic crossclamp was applied and 800 mL of cold blood potassium cardioplegia was administered with diastolic arrest of heart.  Myocardial septal temperatures were monitored throughout the crossclamp.  Attention was turned first to the posterior descending coronary artery, which was as noted diffusely diseased.  Vessel was opened in the proximal third of the posterior descending of 1.5 mm probe passed distally.  The vessel was somewhat thickened.  Using a running 7- 0 Prolene, distal anastomosis was performed.  Additional cold blood cardioplegia was administered down the vein graft.  Attention was then turned to the left anterior descending coronary artery.  This vessel was also very diffusely diseased between the mid and distal third.  Vessel was opened and admitted 1.5-mm probe proximally and distally using running 8-0 Prolene.  The left internal mammary artery was anastomosed to the left anterior descending coronary artery with release of the bulldog on the mammary artery that was prompt rise in myocardial septal temperature.  The bulldog was placed back on the mammary artery, and with the crossclamp still in place, a single punch aortotomy was performed, and the vein graft to the posterior descending coronary artery was anastomosed to the ascending aorta.  The heart was allowed to passively fill and de-aired the ascending aorta and right graft.  The bulldog of the mammary artery was removed with prompt rise in myocardial septal temperature.  Aortic crossclamp was then removed.  Total crossclamp time was 47 minutes.  Sites of anastomosis were  inspected. The patient initially required dual chamber pacing, but before the completion of the case, had converted to a sinus rhythm and required only atrially pacing to increase rate.  Sites of anastomosis were inspected free of bleeding.  The patient was then ventilated and  weaned cardiopulmonary bypass without difficulty with a total pump time of 62 minutes.  Atrial and ventricular pacing wires had been applied.  Graft marker had been applied.  The left pleural tube and Blake mediastinal drain were left in place.  Pericardium was loosely reapproximated. Sternum was closed with #6 stainless steel wire.  Fascia closed with interrupted 0 Vicryl, running 3-0 Vicryl in subcutaneous tissue, 4-0 subcuticular stitch in skin edges.  Dry dressings were applied.  Sponge and needle count was reported as correct at the completion of procedure. The patient tolerated the procedure without obvious complication and was transferred to the surgical intensive care unit.     Lanelle Bal, MD     EG/MEDQ  D:  12/16/2010  T:  12/16/2010  Job:  [id number]  cc:   Jeanella Craze. Little, M.D.  Electronically Signed by Lanelle Bal MD on 12/29/2010 07:32:15 AM

## 2010-12-30 NOTE — Assessment & Plan Note (Addendum)
OFFICE VISIT  [name], [name] DOB:  Sep 18, 1930                                        December 29, 2010 CHART #:  [id number]  The patient comes to the office today after his bypass surgery done on December 15, 2010.  Since that time, he had coronary artery bypass grafting x2.  He is a Sales promotion account executive Witnesses, but tolerated surgery without any precipitous drop in his hemoglobin.  He did have a history of atrial fibrillation, was ultimately discharged home on Coumadin.  He comes to the office today, notes that he has had some cough and some difficulty taking a deep breath.  Between he and his wife, they also is seemed confused about their medications and some other ones that they were discharged home on they had not been taking.  So in addition to seeing the patient, we also reviewed his medication list.  He denies any fever, chills.  There has been no sputum production.  He has had very mild pedal edema.  He is unsure what his glucoses have been, but notes that yesterday it was 120.  He has been followed in the Cardiology Office for his INR and notes that his INR is 1.3, and the dose has been adjusted.  PHYSICAL EXAMINATION:  GENERAL:  On exam today, the patient appears well. HEENT:  His sclerae of good color.  He does not appear to be profoundly anemic. VITAL SIGNS:  His blood pressure is elevated at 175/84, his pulse is 64 and regular, respiratory rate is 16, on room air his O2 sats were 94%, his temperature is 96.9. CHEST:  His sternum is stable and well healed.  He has very slight crackles on exam, but there is no wheezing. EXTREMITIES:  The lower extremities are with very mild pedal edema.  He has no calf tenderness.  The patient's medications are reviewed.  He was sent home to continue losartan 50 mg a day, but never started taking this.  He had been on losartan/hydrochlorothiazide 100/25.  He is also sent home on iron sulfate, though he was not taking  this.  Currently, he is taking amiodarone 200 mg twice a day, decreased this to 200 mg once a day.  He continues on Catapres patch TT-2 once q. weekly.  I have told him to discuss with Dr. Rex [name] or Dr. Lisbeth [name] changing this to p.o. before refilling the next prescription.  He has been taking folic acid 1 mg. He has not been taking ferrous sulfate 325 on his discharge medicine. He completed the course of Lasix for 5 days and potassium for 5 days. He is on Zocor 20 mg a day, Ultram 50 mg p.r.n.  He was sent home on losartan 50 mg a day, but has not been taking it, Lopressor 25 mg twice a day, acarbose 25 twice a day, amlodipine 10 mg a day, aspirin 81 mg a day, CoQ10, Amaryl 4 mg a day, metformin 1000 mg twice a day, and Coumadin as regulated by [facility] office.  In summary, we decreased his amiodarone 200 mg once a day.  He will resume his losartan.  He has a prescription with him of a losartan/hydrochlorothiazide 100/25.  We will resume that prescription, but with taking only half a tablet, so 50 mg/12.5 a day.  I have also given him a prescription for Lasix 40 mg  once a day for 7 days and potassium 20 mEq once a day for 7 days.  Chest x-ray done in the office today shows clear lung fields without effusions.  There is no evidence of pneumonic process.  He is to see Dr. Rex [name] to check his protime tomorrow.  I will plan to see him back in January 22, 2011.  [name] Bal, MD Electronically Signed  EG/MEDQ  D:  12/29/2010  T:  12/29/2010  Job:  [id number]  cc:   [name] Eves, MD [name], M.D.

## 2011-01-16 ENCOUNTER — Other Ambulatory Visit: Payer: Self-pay | Admitting: Cardiothoracic Surgery

## 2011-01-16 DIAGNOSIS — I2510 Atherosclerotic heart disease of native coronary artery without angina pectoris: Secondary | ICD-10-CM

## 2011-01-22 ENCOUNTER — Other Ambulatory Visit: Payer: Self-pay | Admitting: Cardiothoracic Surgery

## 2011-01-22 ENCOUNTER — Encounter: Payer: Self-pay | Admitting: Cardiothoracic Surgery

## 2011-01-22 DIAGNOSIS — I2510 Atherosclerotic heart disease of native coronary artery without angina pectoris: Secondary | ICD-10-CM

## 2011-01-26 ENCOUNTER — Ambulatory Visit
Admission: RE | Admit: 2011-01-26 | Discharge: 2011-01-26 | Disposition: A | Discharge status: Home / Self Care | Payer: Medicare Other | Source: Ambulatory Visit | Attending: Cardiothoracic Surgery | Admitting: Cardiothoracic Surgery

## 2011-01-26 ENCOUNTER — Ambulatory Visit (INDEPENDENT_AMBULATORY_CARE_PROVIDER_SITE_OTHER): Payer: Self-pay | Admitting: Physician Assistant

## 2011-01-26 VITALS — BP 168/85 | HR 58 | Resp 16 | Ht 73.0 in | Wt 220.0 lb

## 2011-01-26 DIAGNOSIS — I219 Acute myocardial infarction, unspecified: Secondary | ICD-10-CM

## 2011-01-26 DIAGNOSIS — E669 Obesity, unspecified: Secondary | ICD-10-CM | POA: Insufficient documentation

## 2011-01-26 DIAGNOSIS — I4891 Unspecified atrial fibrillation: Secondary | ICD-10-CM | POA: Insufficient documentation

## 2011-01-26 DIAGNOSIS — G4730 Sleep apnea, unspecified: Secondary | ICD-10-CM

## 2011-01-26 DIAGNOSIS — E785 Hyperlipidemia, unspecified: Secondary | ICD-10-CM

## 2011-01-26 DIAGNOSIS — I2510 Atherosclerotic heart disease of native coronary artery without angina pectoris: Secondary | ICD-10-CM

## 2011-01-26 DIAGNOSIS — N480 Leukoplakia of penis: Secondary | ICD-10-CM | POA: Insufficient documentation

## 2011-01-26 DIAGNOSIS — I10 Essential (primary) hypertension: Secondary | ICD-10-CM

## 2011-01-26 DIAGNOSIS — E119 Type 2 diabetes mellitus without complications: Secondary | ICD-10-CM

## 2011-01-26 DIAGNOSIS — E1169 Type 2 diabetes mellitus with other specified complication: Secondary | ICD-10-CM | POA: Insufficient documentation

## 2011-01-26 DIAGNOSIS — I480 Paroxysmal atrial fibrillation: Secondary | ICD-10-CM

## 2011-01-26 NOTE — Patient Instructions (Signed)
Follow up as directed with Dr. Rex Kras May resume regular activities May drive Avoid heavy lifting over 10-15 lbs. Until the end of October

## 2011-01-26 NOTE — Progress Notes (Signed)
PCP is No primary provider on file. Referring Provider is Little, Jeanella Craze, MD  Chief Complaint  Patient presents with  . Follow-up    s/pcabgx2 12/15/10 with cxr  He saw Dr. Rex Kras last Friday with some med changes(noted in med section0    HPI: The patient is here for a 3 week followup. He is status post coronary artery bypass grafting x2 by Dr. Servando Snare on 12/15/2010. Since his last office visit he has had improvement in his symptoms. He previously had had some shortness of breath, however this has resolved. At this point he denies any chest discomfort or cough and his only complaint is some persistent difficulty sleeping which he does feel is improving over time. He saw Dr. Rex Kras 3 days ago and at that time his INR was noted to be supratherapeutic at 5.0 and his Coumadin dosage was held and will be restarted at 5 mg daily today. He has a return visit with Dr. Rex Kras and INR check scheduled for October 5.he's maintaining normal sinus rhythm and they anticipate discontinuing his amiodarone at his next visit. He is overall progressing well and plans to begin cardiac rehabilitation in [place].  Past Medical History  Diagnosis Date  . CAD (coronary artery disease)   . Paroxysmal a-fib   . DM2 (diabetes mellitus, type 2)   . Hyperlipidemia   . HTN (hypertension)   . Apnea, sleep   . Myocardial infarction     NON- ST segment elevation  . 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 Cardiac catheterization   . Coronary artery bypass graft     X2, Dr Servando Snare  . Cystoscopy with urethral dilatation     No family history on file.  Social History History  Substance Use Topics  . Smoking status: Former Smoker    Types: Cigarettes    Quit date: 05/27/1957  . Smokeless tobacco: Former Systems developer    Types: Chew  . Alcohol Use: Not on file    Current Outpatient Prescriptions  Medication Sig Dispense Refill  .  acarbose (PRECOSE) 25 MG tablet Take 25 mg by mouth 2 (two) times daily with a meal.        . amiodarone (PACERONE) 200 MG tablet Take 200 mg by mouth daily.        Marland Kitchen amLODipine (NORVASC) 10 MG tablet Take 10 mg by mouth daily.        Marland Kitchen aspirin 81 MG tablet Take 81 mg by mouth daily.        . cloNIDine (CATAPRES) 0.3 MG tablet Take 0.3 mg by mouth 2 (two) times daily.        . ferrous sulfate 325 (65 FE) MG tablet Take 325 mg by mouth 3 (three) times daily with meals.        . folic acid (FOLVITE) 1 MG tablet Take 1 mg by mouth daily.        Marland Kitchen glimepiride (AMARYL) 4 MG tablet Take 4 mg by mouth 2 (two) times daily.        Marland Kitchen losartan-hydrochlorothiazide (HYZAAR) 100-25 MG per tablet Take 1 tablet by mouth daily.        . metformin (FORTAMET) 1000 MG (OSM) 24 hr tablet Take 1,000 mg by mouth 2 (two) times daily with a meal.        . metoprolol tartrate (LOPRESSOR) 25 MG tablet Take 25 mg by mouth 2 (two) times  daily.        . simvastatin (ZOCOR) 20 MG tablet Take 20 mg by mouth at bedtime.        . traMADol (ULTRAM) 50 MG tablet Take 50 mg by mouth every 6 (six) hours as needed.        . warfarin (COUMADIN) 5 MG tablet Take 5 mg by mouth daily. Monitored by Dr Rex Kras       . amiodarone (PACERONE) 200 MG tablet Take 200 mg by mouth 2 (two) times daily.        . cloNIDine (CATAPRES - DOSED IN MG/24 HR) 0.2 mg/24hr patch Place 1 patch onto the skin once a week.        . co-enzyme Q-10 30 MG capsule Take 30 mg by mouth 2 (two) times daily.        Marland Kitchen losartan (COZAAR) 50 MG tablet Take 50 mg by mouth daily.          No Known Allergies  Review of Systems: He denies fevers chills cough shortness of breath or chest discomfort. No lower extremity edema.  BP 168/85  Pulse 58  Resp 16  Ht 6\' 1"  (1.854 m)  Wt 220 lb (99.791 kg)  BMI 29.03 kg/m2  SpO2 97% Physical Exam: Incisions: His sternal and bilateral lower extremity EVH incisions have healed well. No erythema or drainage. Sternum is stable to  palpation. Heart regular rate and rhythm without murmurs rubs or gallops lungs are clear to auscultation. Extremities no edema.  Diagnostic Tests: Chest x-ray is within normal limits no effusions or atelectasis.  Impression: The patient is doing well status post coronary artery bypass grafting x2 on 12/15/2010.  Plan: He will followup as directed with Dr. Rex Kras for continued medical management of his hypertension and his anticoagulation. It is anticipated that his Coumadin and amiodarone may be discontinued at his next visit and we will leave that up to Dr. Eddie Dibbles discretion. He also may begin cardiac rehabilitation at this point. He has already begun driving per Dr. Servando Snare and may resume his regular activities at this point out that he is asked to refrain from any significant heavy lifting until at least the end of October. We will see him back on a when necessary basis.

## 2011-05-21 DIAGNOSIS — I10 Essential (primary) hypertension: Secondary | ICD-10-CM | POA: Diagnosis not present

## 2011-05-21 DIAGNOSIS — I2510 Atherosclerotic heart disease of native coronary artery without angina pectoris: Secondary | ICD-10-CM | POA: Diagnosis not present

## 2011-05-21 DIAGNOSIS — Z7901 Long term (current) use of anticoagulants: Secondary | ICD-10-CM | POA: Diagnosis not present

## 2011-05-26 DIAGNOSIS — Z961 Presence of intraocular lens: Secondary | ICD-10-CM | POA: Diagnosis not present

## 2011-06-29 DIAGNOSIS — N390 Urinary tract infection, site not specified: Secondary | ICD-10-CM | POA: Diagnosis not present

## 2011-10-30 DIAGNOSIS — L821 Other seborrheic keratosis: Secondary | ICD-10-CM | POA: Diagnosis not present

## 2011-10-30 DIAGNOSIS — L570 Actinic keratosis: Secondary | ICD-10-CM | POA: Diagnosis not present

## 2011-11-05 DIAGNOSIS — Z23 Encounter for immunization: Secondary | ICD-10-CM | POA: Diagnosis not present

## 2011-11-05 DIAGNOSIS — N182 Chronic kidney disease, stage 2 (mild): Secondary | ICD-10-CM | POA: Diagnosis not present

## 2011-11-05 DIAGNOSIS — I10 Essential (primary) hypertension: Secondary | ICD-10-CM | POA: Diagnosis not present

## 2011-11-05 DIAGNOSIS — IMO0001 Reserved for inherently not codable concepts without codable children: Secondary | ICD-10-CM | POA: Diagnosis not present

## 2011-11-05 DIAGNOSIS — S91309A Unspecified open wound, unspecified foot, initial encounter: Secondary | ICD-10-CM | POA: Diagnosis not present

## 2011-11-05 DIAGNOSIS — E7800 Pure hypercholesterolemia, unspecified: Secondary | ICD-10-CM | POA: Diagnosis not present

## 2011-11-30 DIAGNOSIS — E782 Mixed hyperlipidemia: Secondary | ICD-10-CM | POA: Diagnosis not present

## 2011-11-30 DIAGNOSIS — I10 Essential (primary) hypertension: Secondary | ICD-10-CM | POA: Diagnosis not present

## 2011-11-30 DIAGNOSIS — I2510 Atherosclerotic heart disease of native coronary artery without angina pectoris: Secondary | ICD-10-CM | POA: Diagnosis not present

## 2012-02-01 DIAGNOSIS — Z125 Encounter for screening for malignant neoplasm of prostate: Secondary | ICD-10-CM | POA: Diagnosis not present

## 2012-02-01 DIAGNOSIS — I10 Essential (primary) hypertension: Secondary | ICD-10-CM | POA: Diagnosis not present

## 2012-02-01 DIAGNOSIS — E7800 Pure hypercholesterolemia, unspecified: Secondary | ICD-10-CM | POA: Diagnosis not present

## 2012-02-01 DIAGNOSIS — N182 Chronic kidney disease, stage 2 (mild): Secondary | ICD-10-CM | POA: Diagnosis not present

## 2012-02-15 DIAGNOSIS — R209 Unspecified disturbances of skin sensation: Secondary | ICD-10-CM | POA: Diagnosis not present

## 2012-02-25 DIAGNOSIS — I6789 Other cerebrovascular disease: Secondary | ICD-10-CM | POA: Diagnosis not present

## 2012-02-27 DIAGNOSIS — I6789 Other cerebrovascular disease: Secondary | ICD-10-CM | POA: Diagnosis not present

## 2012-02-27 DIAGNOSIS — I6350 Cerebral infarction due to unspecified occlusion or stenosis of unspecified cerebral artery: Secondary | ICD-10-CM | POA: Diagnosis not present

## 2012-03-03 DIAGNOSIS — I6789 Other cerebrovascular disease: Secondary | ICD-10-CM | POA: Diagnosis not present

## 2012-03-08 DIAGNOSIS — I69998 Other sequelae following unspecified cerebrovascular disease: Secondary | ICD-10-CM | POA: Diagnosis not present

## 2012-03-08 DIAGNOSIS — I6789 Other cerebrovascular disease: Secondary | ICD-10-CM | POA: Diagnosis not present

## 2012-03-08 DIAGNOSIS — R209 Unspecified disturbances of skin sensation: Secondary | ICD-10-CM | POA: Diagnosis not present

## 2012-03-08 DIAGNOSIS — M6281 Muscle weakness (generalized): Secondary | ICD-10-CM | POA: Diagnosis not present

## 2012-03-08 DIAGNOSIS — R42 Dizziness and giddiness: Secondary | ICD-10-CM | POA: Diagnosis not present

## 2012-03-08 DIAGNOSIS — Z9181 History of falling: Secondary | ICD-10-CM | POA: Diagnosis not present

## 2012-03-08 DIAGNOSIS — R9389 Abnormal findings on diagnostic imaging of other specified body structures: Secondary | ICD-10-CM | POA: Diagnosis not present

## 2012-03-08 DIAGNOSIS — IMO0001 Reserved for inherently not codable concepts without codable children: Secondary | ICD-10-CM | POA: Diagnosis not present

## 2012-03-08 DIAGNOSIS — M25569 Pain in unspecified knee: Secondary | ICD-10-CM | POA: Diagnosis not present

## 2012-03-11 DIAGNOSIS — R42 Dizziness and giddiness: Secondary | ICD-10-CM | POA: Diagnosis not present

## 2012-03-11 DIAGNOSIS — I69998 Other sequelae following unspecified cerebrovascular disease: Secondary | ICD-10-CM | POA: Diagnosis not present

## 2012-03-11 DIAGNOSIS — IMO0001 Reserved for inherently not codable concepts without codable children: Secondary | ICD-10-CM | POA: Diagnosis not present

## 2012-03-11 DIAGNOSIS — I6789 Other cerebrovascular disease: Secondary | ICD-10-CM | POA: Diagnosis not present

## 2012-03-11 DIAGNOSIS — Z9181 History of falling: Secondary | ICD-10-CM | POA: Diagnosis not present

## 2012-03-11 DIAGNOSIS — R209 Unspecified disturbances of skin sensation: Secondary | ICD-10-CM | POA: Diagnosis not present

## 2012-03-17 DIAGNOSIS — Z9181 History of falling: Secondary | ICD-10-CM | POA: Diagnosis not present

## 2012-03-17 DIAGNOSIS — R209 Unspecified disturbances of skin sensation: Secondary | ICD-10-CM | POA: Diagnosis not present

## 2012-03-17 DIAGNOSIS — IMO0001 Reserved for inherently not codable concepts without codable children: Secondary | ICD-10-CM | POA: Diagnosis not present

## 2012-03-17 DIAGNOSIS — I6789 Other cerebrovascular disease: Secondary | ICD-10-CM | POA: Diagnosis not present

## 2012-03-17 DIAGNOSIS — I69998 Other sequelae following unspecified cerebrovascular disease: Secondary | ICD-10-CM | POA: Diagnosis not present

## 2012-03-17 DIAGNOSIS — R42 Dizziness and giddiness: Secondary | ICD-10-CM | POA: Diagnosis not present

## 2012-03-21 DIAGNOSIS — M25569 Pain in unspecified knee: Secondary | ICD-10-CM | POA: Diagnosis not present

## 2012-03-21 DIAGNOSIS — R209 Unspecified disturbances of skin sensation: Secondary | ICD-10-CM | POA: Diagnosis not present

## 2012-03-21 DIAGNOSIS — I69998 Other sequelae following unspecified cerebrovascular disease: Secondary | ICD-10-CM | POA: Diagnosis not present

## 2012-03-21 DIAGNOSIS — M6281 Muscle weakness (generalized): Secondary | ICD-10-CM | POA: Diagnosis not present

## 2012-03-21 DIAGNOSIS — IMO0001 Reserved for inherently not codable concepts without codable children: Secondary | ICD-10-CM | POA: Diagnosis not present

## 2012-03-21 DIAGNOSIS — Z9181 History of falling: Secondary | ICD-10-CM | POA: Diagnosis not present

## 2012-03-21 DIAGNOSIS — I6789 Other cerebrovascular disease: Secondary | ICD-10-CM | POA: Diagnosis not present

## 2012-03-21 DIAGNOSIS — R42 Dizziness and giddiness: Secondary | ICD-10-CM | POA: Diagnosis not present

## 2012-03-31 DIAGNOSIS — IMO0001 Reserved for inherently not codable concepts without codable children: Secondary | ICD-10-CM | POA: Diagnosis not present

## 2012-03-31 DIAGNOSIS — R42 Dizziness and giddiness: Secondary | ICD-10-CM | POA: Diagnosis not present

## 2012-03-31 DIAGNOSIS — M25569 Pain in unspecified knee: Secondary | ICD-10-CM | POA: Diagnosis not present

## 2012-03-31 DIAGNOSIS — R209 Unspecified disturbances of skin sensation: Secondary | ICD-10-CM | POA: Diagnosis not present

## 2012-03-31 DIAGNOSIS — I6789 Other cerebrovascular disease: Secondary | ICD-10-CM | POA: Diagnosis not present

## 2012-04-07 DIAGNOSIS — R209 Unspecified disturbances of skin sensation: Secondary | ICD-10-CM | POA: Diagnosis not present

## 2012-04-07 DIAGNOSIS — R42 Dizziness and giddiness: Secondary | ICD-10-CM | POA: Diagnosis not present

## 2012-04-07 DIAGNOSIS — I6789 Other cerebrovascular disease: Secondary | ICD-10-CM | POA: Diagnosis not present

## 2012-04-07 DIAGNOSIS — IMO0001 Reserved for inherently not codable concepts without codable children: Secondary | ICD-10-CM | POA: Diagnosis not present

## 2012-04-07 DIAGNOSIS — M25569 Pain in unspecified knee: Secondary | ICD-10-CM | POA: Diagnosis not present

## 2012-04-07 DIAGNOSIS — I69998 Other sequelae following unspecified cerebrovascular disease: Secondary | ICD-10-CM | POA: Diagnosis not present

## 2012-05-02 DIAGNOSIS — L821 Other seborrheic keratosis: Secondary | ICD-10-CM | POA: Diagnosis not present

## 2012-05-02 DIAGNOSIS — L570 Actinic keratosis: Secondary | ICD-10-CM | POA: Diagnosis not present

## 2012-05-02 DIAGNOSIS — D2330 Other benign neoplasm of skin of unspecified part of face: Secondary | ICD-10-CM | POA: Diagnosis not present

## 2012-05-10 DIAGNOSIS — E559 Vitamin D deficiency, unspecified: Secondary | ICD-10-CM | POA: Diagnosis not present

## 2012-05-10 DIAGNOSIS — I10 Essential (primary) hypertension: Secondary | ICD-10-CM | POA: Diagnosis not present

## 2012-05-10 DIAGNOSIS — I4891 Unspecified atrial fibrillation: Secondary | ICD-10-CM | POA: Diagnosis not present

## 2012-05-10 DIAGNOSIS — E7800 Pure hypercholesterolemia, unspecified: Secondary | ICD-10-CM | POA: Diagnosis not present

## 2012-05-10 DIAGNOSIS — IMO0001 Reserved for inherently not codable concepts without codable children: Secondary | ICD-10-CM | POA: Diagnosis not present

## 2012-06-07 ENCOUNTER — Other Ambulatory Visit (HOSPITAL_COMMUNITY): Payer: Self-pay | Admitting: Cardiology

## 2012-06-07 DIAGNOSIS — Z951 Presence of aortocoronary bypass graft: Secondary | ICD-10-CM | POA: Diagnosis not present

## 2012-06-07 DIAGNOSIS — R55 Syncope and collapse: Secondary | ICD-10-CM | POA: Diagnosis not present

## 2012-06-07 DIAGNOSIS — I2510 Atherosclerotic heart disease of native coronary artery without angina pectoris: Secondary | ICD-10-CM | POA: Diagnosis not present

## 2012-06-07 DIAGNOSIS — I10 Essential (primary) hypertension: Secondary | ICD-10-CM | POA: Diagnosis not present

## 2012-06-16 ENCOUNTER — Ambulatory Visit (HOSPITAL_COMMUNITY): Payer: Medicare Other

## 2012-07-14 DIAGNOSIS — I2510 Atherosclerotic heart disease of native coronary artery without angina pectoris: Secondary | ICD-10-CM | POA: Diagnosis not present

## 2012-07-14 DIAGNOSIS — I10 Essential (primary) hypertension: Secondary | ICD-10-CM | POA: Diagnosis not present

## 2012-07-14 DIAGNOSIS — Z951 Presence of aortocoronary bypass graft: Secondary | ICD-10-CM | POA: Diagnosis not present

## 2012-07-14 DIAGNOSIS — R42 Dizziness and giddiness: Secondary | ICD-10-CM | POA: Diagnosis not present

## 2012-07-21 ENCOUNTER — Ambulatory Visit (HOSPITAL_COMMUNITY): Payer: Medicare Other

## 2012-07-28 ENCOUNTER — Ambulatory Visit (HOSPITAL_COMMUNITY)
Admission: RE | Admit: 2012-07-28 | Discharge: 2012-07-28 | Disposition: A | Discharge status: Home / Self Care | Payer: Medicare Other | Source: Ambulatory Visit | Attending: Cardiology | Admitting: Cardiology

## 2012-07-28 DIAGNOSIS — I10 Essential (primary) hypertension: Secondary | ICD-10-CM | POA: Diagnosis not present

## 2012-07-28 DIAGNOSIS — R55 Syncope and collapse: Secondary | ICD-10-CM | POA: Diagnosis not present

## 2012-07-28 DIAGNOSIS — I2510 Atherosclerotic heart disease of native coronary artery without angina pectoris: Secondary | ICD-10-CM | POA: Diagnosis not present

## 2012-07-28 NOTE — Progress Notes (Signed)
2D Echo Performed 07/28/2012    Marygrace Drought, RCS

## 2012-08-09 DIAGNOSIS — N182 Chronic kidney disease, stage 2 (mild): Secondary | ICD-10-CM | POA: Diagnosis not present

## 2012-08-09 DIAGNOSIS — Z79899 Other long term (current) drug therapy: Secondary | ICD-10-CM | POA: Diagnosis not present

## 2012-08-09 DIAGNOSIS — E1165 Type 2 diabetes mellitus with hyperglycemia: Secondary | ICD-10-CM | POA: Diagnosis not present

## 2012-08-09 DIAGNOSIS — E559 Vitamin D deficiency, unspecified: Secondary | ICD-10-CM | POA: Diagnosis not present

## 2012-08-09 DIAGNOSIS — E7800 Pure hypercholesterolemia, unspecified: Secondary | ICD-10-CM | POA: Diagnosis not present

## 2012-08-09 DIAGNOSIS — R300 Dysuria: Secondary | ICD-10-CM | POA: Diagnosis not present

## 2012-08-09 DIAGNOSIS — I10 Essential (primary) hypertension: Secondary | ICD-10-CM | POA: Diagnosis not present

## 2012-08-09 DIAGNOSIS — E1139 Type 2 diabetes mellitus with other diabetic ophthalmic complication: Secondary | ICD-10-CM | POA: Diagnosis not present

## 2012-09-05 DIAGNOSIS — IMO0002 Reserved for concepts with insufficient information to code with codable children: Secondary | ICD-10-CM | POA: Diagnosis not present

## 2012-09-05 DIAGNOSIS — M999 Biomechanical lesion, unspecified: Secondary | ICD-10-CM | POA: Diagnosis not present

## 2012-09-05 DIAGNOSIS — M5137 Other intervertebral disc degeneration, lumbosacral region: Secondary | ICD-10-CM | POA: Diagnosis not present

## 2012-10-31 DIAGNOSIS — L821 Other seborrheic keratosis: Secondary | ICD-10-CM | POA: Diagnosis not present

## 2012-10-31 DIAGNOSIS — Z85828 Personal history of other malignant neoplasm of skin: Secondary | ICD-10-CM | POA: Diagnosis not present

## 2012-10-31 DIAGNOSIS — L570 Actinic keratosis: Secondary | ICD-10-CM | POA: Diagnosis not present

## 2012-11-09 DIAGNOSIS — N182 Chronic kidney disease, stage 2 (mild): Secondary | ICD-10-CM | POA: Diagnosis not present

## 2012-11-09 DIAGNOSIS — I10 Essential (primary) hypertension: Secondary | ICD-10-CM | POA: Diagnosis not present

## 2012-11-09 DIAGNOSIS — E7800 Pure hypercholesterolemia, unspecified: Secondary | ICD-10-CM | POA: Diagnosis not present

## 2012-11-09 DIAGNOSIS — E559 Vitamin D deficiency, unspecified: Secondary | ICD-10-CM | POA: Diagnosis not present

## 2012-11-09 DIAGNOSIS — E1139 Type 2 diabetes mellitus with other diabetic ophthalmic complication: Secondary | ICD-10-CM | POA: Diagnosis not present

## 2012-12-23 ENCOUNTER — Other Ambulatory Visit: Payer: Self-pay

## 2012-12-23 MED ORDER — LABETALOL HCL 200 MG PO TABS: 400.0000 mg | ORAL_TABLET | Freq: Two times a day (BID) | ORAL | Status: DC

## 2012-12-23 NOTE — Telephone Encounter (Signed)
Rx was sent to pharmacy electronically. 

## 2013-02-09 DIAGNOSIS — E559 Vitamin D deficiency, unspecified: Secondary | ICD-10-CM | POA: Diagnosis not present

## 2013-02-09 DIAGNOSIS — I10 Essential (primary) hypertension: Secondary | ICD-10-CM | POA: Diagnosis not present

## 2013-02-09 DIAGNOSIS — E1139 Type 2 diabetes mellitus with other diabetic ophthalmic complication: Secondary | ICD-10-CM | POA: Diagnosis not present

## 2013-02-14 ENCOUNTER — Encounter: Payer: Self-pay | Admitting: *Deleted

## 2013-02-15 ENCOUNTER — Ambulatory Visit (INDEPENDENT_AMBULATORY_CARE_PROVIDER_SITE_OTHER): Payer: Medicare Other | Admitting: Cardiology

## 2013-02-15 ENCOUNTER — Encounter: Payer: Self-pay | Admitting: Cardiology

## 2013-02-15 VITALS — BP 140/80 | HR 60 | Ht 73.5 in | Wt 232.3 lb

## 2013-02-15 DIAGNOSIS — E669 Obesity, unspecified: Secondary | ICD-10-CM

## 2013-02-15 DIAGNOSIS — I219 Acute myocardial infarction, unspecified: Secondary | ICD-10-CM | POA: Diagnosis not present

## 2013-02-15 DIAGNOSIS — E785 Hyperlipidemia, unspecified: Secondary | ICD-10-CM | POA: Diagnosis not present

## 2013-02-15 DIAGNOSIS — G4730 Sleep apnea, unspecified: Secondary | ICD-10-CM

## 2013-02-15 DIAGNOSIS — I4891 Unspecified atrial fibrillation: Secondary | ICD-10-CM | POA: Diagnosis not present

## 2013-02-15 DIAGNOSIS — I10 Essential (primary) hypertension: Secondary | ICD-10-CM

## 2013-02-15 DIAGNOSIS — I214 Non-ST elevation (NSTEMI) myocardial infarction: Secondary | ICD-10-CM

## 2013-02-15 DIAGNOSIS — I2510 Atherosclerotic heart disease of native coronary artery without angina pectoris: Secondary | ICD-10-CM

## 2013-02-15 DIAGNOSIS — Z951 Presence of aortocoronary bypass graft: Secondary | ICD-10-CM

## 2013-02-15 NOTE — Patient Instructions (Addendum)
Continue with current medication  Your physician wants you to follow-up in 6 month with an EXTENDER AND 12 MONTH WITH Dr Ellyn Hack.  You will receive a reminder letter in the mail two months in advance. If you don't receive a letter, please call our office to schedule the follow-up appointment.

## 2013-02-16 ENCOUNTER — Encounter: Payer: Self-pay | Admitting: Cardiology

## 2013-02-16 DIAGNOSIS — E669 Obesity, unspecified: Secondary | ICD-10-CM | POA: Insufficient documentation

## 2013-02-16 MED ORDER — ROSUVASTATIN CALCIUM 5 MG PO TABS: ORAL_TABLET | ORAL | Status: DC

## 2013-02-16 NOTE — Assessment & Plan Note (Signed)
Relatively stable, allowing for low depressive hypertension due to his prior it issues with orthostatic hypotension.  We backed off on his labetalol for concerns or bradycardias well.  He is on amlodipine, labetalol and a commendation ARB/HCTZ  Plan: Continue current regimen

## 2013-02-16 NOTE — Assessment & Plan Note (Signed)
As far as I know he was not really tolerant of CPAP.

## 2013-02-16 NOTE — Assessment & Plan Note (Signed)
His EF from Echo in March was excellent with no real wall motion and melena.  He seemed to escaped from any significant damage from his MI.

## 2013-02-16 NOTE — Assessment & Plan Note (Addendum)
He's had lipids checked by his primary physician.  He's been intolerant of statins, or any dysuria the labs look like.  We'll then determine whether or not we need to put him on something.  Unfortunately, I DID not have the recent labs from his PCP until after he had left.  I think we may need to try another statin, we will see if we can get him to try taking low-dose Crestor 5 mg starting at 1 tablet 2 times a week.

## 2013-02-16 NOTE — Assessment & Plan Note (Signed)
Elderly gentleman, doing relatively well with his coronary disease and CABG.  plan: Continue beta blocker as labetalol due to his difficulty with blood pressure (and that he is stable on it), combination losartan-ACTZ along with aspirin.  Not on statin due to prior history of intolerances.

## 2013-02-16 NOTE — Progress Notes (Signed)
PATIENT: [name] MRN: [id number]  DOB: 02-12-1931   DOV:02/16/2013 PCP: Leonides Sake, MD  Clinic Note: Chief Complaint  Patient presents with  . ROV 3 months    No complaints.   HPI: [name] is a 79 y.o. male with a PMH below who presents today for six-month followup.  I last saw him in February of 2014.  Prior to that I back off on his labetalol due to some near-syncopal type symptoms.  He is a relatively fine and no further orthostatic symptoms.  He's had an echocardiogram in the interim this is a well-preserved ejection fraction of 55-60% with severe concentric LVH and grade 1 diastolic dysfunction.  Interval History: He does today I did relatively well.  He was an avid walker as well as a stationary bike rider, but his ligamentous was walking by his right knee pain he just can't walk as much as he used to.  He still does use the bike but hasn't done as much recently.  He says that he can go about 15-20 minutes a time and then rests and is okay.  He is very active but not doing the routine exercise. 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 From a cardiac standpoint, he has not had any further orthostatic hypotension episodes.  No syncope or near syncope.  The remainder of Cardiovascular ROS: no chest pain or dyspnea on exertion positive for - Intermittent trace edema negative for - irregular heartbeat, loss of consciousness, murmur, orthopnea, palpitations, paroxysmal nocturnal dyspnea, rapid heart rate or shortness of breath: Additional cardiac review of systems: Lightheadedness - no, dizziness - no, syncope/near-syncope - no; TIA/amaurosis fugax - no Melena - no, hematochezia no; hematuria - no; nosebleeds - no; claudication - no  Past Medical History  Diagnosis Date  . Non-STEMI (non-ST elevated myocardial infarction) July 2012    Ostial LAD 70% (2 posterior left main for PCI),  subtotal occlusion of the RCA  . CAD in native artery July 2012    Referred for CABG  . 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 Servando Snare)  . Postoperative atrial fibrillation     Post CABG, no sign recurrence  . HTN (hypertension)   . Type II diabetes mellitus with complication     CAD, occasions  . Hyperlipidemia LDL goal <70 July 2012    Monitor by PCP  . 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 Refusal of blood transfusions as patient is Jehovah's Witness   . Sleep apnea   . Abdominal aneurysm     mild aneursymal dilatation - 04/23/2011    Prior Cardiac Evaluation and Past Surgical History: Past Surgical History  Procedure Laterality Date  . Cardiac catheterization  12/11/2010    Dr. Chase Picket - subsequent cath - normal LV systolic function, no renal artery stenosis, severe 2-vessel disease with subtotaled RCA prox and distal 60% lesion and complex 70% area of narrowing of ostium of LAD  . Coronary artery bypass graft  12/15/2010    X2, Dr Servando Snare; LIMA to LAD, SVG to PDA;   Marland Kitchen Cystoscopy with urethral dilatation    . Transthoracic echocardiogram  07/28/2012    EF 55-60%, severe conc hypertrophy, normal systolic function, grade 1 diastolic dysfunction; AS    Allergies  Allergen Reactions  . Other  Other (See Comments)    No  BLOOD PRODUCTS - Pt is Jeohovah's Witness  . Pravastatin     Myalgias    Current Outpatient Prescriptions  Medication Sig Dispense Refill  . amLODipine (NORVASC) 10 MG tablet Take 10 mg by mouth daily.        Marland Kitchen aspirin 81 MG tablet Take 81 mg by mouth daily.        Marland Kitchen Bioflavonoid Products (SUPER C-500) TABS Take 1 tablet by mouth daily.      . cholecalciferol (VITAMIN D) 400 UNITS TABS tablet Take 400 Units by mouth daily.      . Cyanocobalamin (VITAMIN B-12 CR PO) Take 5,000 mg by mouth daily.      Marland Kitchen glimepiride (AMARYL) 4 MG tablet Take 4 mg by mouth 2 (two) times daily.        Marland Kitchen labetalol  (NORMODYNE) 200 MG tablet Take 200 mg by mouth 2 (two) times daily.      Marland Kitchen losartan-hydrochlorothiazide (HYZAAR) 100-25 MG per tablet Take 1 tablet by mouth daily.        Marland Kitchen MAGNESIUM OXIDE PO Take 1 tablet by mouth at bedtime.      . metformin (FORTAMET) 1000 MG (OSM) 24 hr tablet Take 1,000 mg by mouth 2 (two) times daily with a meal.        . Potassium Gluconate 595 MG CAPS Take 1 capsule by mouth daily.       No current facility-administered medications for this visit.    History   Social History Narrative   Married father of one.  Previously uses stationary bike routinely.  Now reduced due to other social stressors.   Quit smoking 50 years ago.  Does not drink alcohol   He currently is undergoing a stress of.  He is caring for his wife who is sick but recently diagnosed thyroid cancer and is pending thyroidectomy.  Unfortunately recently his father-in-law just died so he is now also with a care of his mother-in-law as well.  ROS: A comprehensive Review of Systems - Negative except Date minor residual stroke symptoms.  Also his significant pain in the right knee.  PHYSICAL EXAM BP 140/80  Pulse 60  Ht 6' 1.5" (1.867 m)  Wt 232 lb 4.8 oz (105.371 kg)  BMI 30.23 kg/m2 General appearance: alert, cooperative, appears stated age, no distress and mildly obese Neck: no adenopathy, no carotid bruit and no JVD Lungs: clear to auscultation bilaterally, normal percussion bilaterally and non-labored Heart: regular rate and rhythm, S1, S2 normal, soft 1/6  SEM at RUSB radiating to carotids, click, rub or gallop ; nondisplaced PMI Abdomen: soft, non-tender; bowel sounds normal; no masses,  no organomegaly;  Extremities: extremities normal, atraumatic, no cyanosis, and trace edema Pulses: 2+ and symmetric;  Neurologic: Mental status: Alert, oriented, thought content appropriate  [id number] today: Yes Rate: 60 , Rhythm:  NSR with first-degree AV Block;    Recent Labs: Recently checked by  PCP.  I did not get his results until after I seen him  Glucose 180, BUN 25/creatinine 1.04, sodium 141, potassium 5.2, LFTs normal.  Hemoglobin A1c 7.5  Total cholesterol 2:15, triglyceride 168, HDL 55, LDL 126  ASSESSMENT / PLAN: CAD in native artery Elderly gentleman, doing relatively well with his coronary disease and CABG.  plan: Continue beta blocker as labetalol due to his difficulty with blood pressure (and that he is stable on it), combination losartan-ACTZ along with aspirin.  Not on statin due to prior history  of intolerances.  History of: Non-STEMI (non-ST elevated myocardial infarction) His EF from Echo in March was excellent with no real wall motion and melena.  He seemed to escaped from any significant damage from his MI.  HTN (hypertension) Relatively stable, allowing for low depressive hypertension due to his prior it issues with orthostatic hypotension.  We backed off on his labetalol for concerns or bradycardias well.  He is on amlodipine, labetalol and a commendation ARB/HCTZ  Plan: Continue current regimen   Obesity (BMI 30-39.9) He has tried to adjust his diet, now that he is the one controlling what foods make her, he thinks that maybe he will be able lose weight, as he is not as it enters him in the cooking department.  He also hopes that once his knee starts getting better he can get back out walk.  Hyperlipidemia LDL goal <70 He's had lipids checked by his primary physician.  He's been intolerant of statins, or any dysuria the labs look like.  We'll then determine whether or not we need to put him on something.  Unfortunately, I DID not have the recent labs from his PCP until after he had left.  I think we may need to try another statin, we will see if we can get him to try taking low-dose Crestor 5 mg starting at 1 tablet 2 times a week.   Apnea, sleep As far as I know he was not really tolerant of CPAP.   Orders Placed This Encounter  Procedures  . EKG  12-Lead   Followup: One year  [name], M.D., M.S. [facility] [address] [place], [place]  [id number]  [phone] Pager # [phone]

## 2013-02-16 NOTE — Assessment & Plan Note (Signed)
He has tried to adjust his diet, now that he is the one controlling what foods make her, he thinks that maybe he will be able lose weight, as he is not as it enters him in the cooking department.  He also hopes that once his knee starts getting better he can get back out walk.

## 2013-02-27 ENCOUNTER — Telehealth: Payer: Self-pay | Admitting: *Deleted

## 2013-02-27 NOTE — Telephone Encounter (Signed)
Spoke to wife. Informed wife that Dr Ellyn Hack would like for [name] to try Crestor 5 mg every 3 days.  She states she will relay the message and that he had already picked up the medication.

## 2013-05-01 ENCOUNTER — Other Ambulatory Visit: Payer: Self-pay | Admitting: Dermatology

## 2013-05-01 DIAGNOSIS — D485 Neoplasm of uncertain behavior of skin: Secondary | ICD-10-CM | POA: Diagnosis not present

## 2013-05-01 DIAGNOSIS — L821 Other seborrheic keratosis: Secondary | ICD-10-CM | POA: Diagnosis not present

## 2013-05-01 DIAGNOSIS — Z85828 Personal history of other malignant neoplasm of skin: Secondary | ICD-10-CM | POA: Diagnosis not present

## 2013-05-01 DIAGNOSIS — C44319 Basal cell carcinoma of skin of other parts of face: Secondary | ICD-10-CM | POA: Diagnosis not present

## 2013-05-01 DIAGNOSIS — L570 Actinic keratosis: Secondary | ICD-10-CM | POA: Diagnosis not present

## 2013-06-19 DIAGNOSIS — Z1331 Encounter for screening for depression: Secondary | ICD-10-CM | POA: Diagnosis not present

## 2013-06-19 DIAGNOSIS — Z6831 Body mass index (BMI) 31.0-31.9, adult: Secondary | ICD-10-CM | POA: Diagnosis not present

## 2013-06-19 DIAGNOSIS — E1139 Type 2 diabetes mellitus with other diabetic ophthalmic complication: Secondary | ICD-10-CM | POA: Diagnosis not present

## 2013-06-19 DIAGNOSIS — I10 Essential (primary) hypertension: Secondary | ICD-10-CM | POA: Diagnosis not present

## 2013-06-19 DIAGNOSIS — E559 Vitamin D deficiency, unspecified: Secondary | ICD-10-CM | POA: Diagnosis not present

## 2013-06-19 DIAGNOSIS — Z9181 History of falling: Secondary | ICD-10-CM | POA: Diagnosis not present

## 2013-06-19 DIAGNOSIS — E1165 Type 2 diabetes mellitus with hyperglycemia: Secondary | ICD-10-CM | POA: Diagnosis not present

## 2013-08-16 ENCOUNTER — Ambulatory Visit: Payer: Medicare Other | Admitting: Cardiology

## 2013-08-31 ENCOUNTER — Encounter: Payer: Self-pay | Admitting: Cardiology

## 2013-08-31 ENCOUNTER — Ambulatory Visit (INDEPENDENT_AMBULATORY_CARE_PROVIDER_SITE_OTHER): Payer: Medicare Other | Admitting: Cardiology

## 2013-08-31 VITALS — BP 160/80 | HR 53 | Ht 73.0 in | Wt 231.0 lb

## 2013-08-31 DIAGNOSIS — I498 Other specified cardiac arrhythmias: Secondary | ICD-10-CM

## 2013-08-31 DIAGNOSIS — I2510 Atherosclerotic heart disease of native coronary artery without angina pectoris: Secondary | ICD-10-CM | POA: Diagnosis not present

## 2013-08-31 DIAGNOSIS — R001 Bradycardia, unspecified: Secondary | ICD-10-CM

## 2013-08-31 DIAGNOSIS — E119 Type 2 diabetes mellitus without complications: Secondary | ICD-10-CM | POA: Diagnosis not present

## 2013-08-31 DIAGNOSIS — I10 Essential (primary) hypertension: Secondary | ICD-10-CM | POA: Diagnosis not present

## 2013-08-31 DIAGNOSIS — Z951 Presence of aortocoronary bypass graft: Secondary | ICD-10-CM | POA: Diagnosis not present

## 2013-08-31 DIAGNOSIS — E785 Hyperlipidemia, unspecified: Secondary | ICD-10-CM

## 2013-08-31 HISTORY — DX: Bradycardia, unspecified: R00.1

## 2013-08-31 MED ORDER — DOXAZOSIN MESYLATE 2 MG PO TABS: 2.0000 mg | ORAL_TABLET | Freq: Every day | ORAL | Status: DC

## 2013-08-31 MED ORDER — LABETALOL HCL 200 MG PO TABS: 100.0000 mg | ORAL_TABLET | Freq: Two times a day (BID) | ORAL | Status: DC

## 2013-08-31 NOTE — Assessment & Plan Note (Signed)
Statin intol- myalgias

## 2013-08-31 NOTE — Progress Notes (Signed)
08/31/2013 [name]   May 18, 1931  [id number]  Primary Physicia [name] Sake, MD Primary Cardiologist: Dr [name]  HPI:  Pleasant 80 y/o previously followed by Dr :[name], now Dr [name]. He has a history of CAD, s/p CABG in 2012. He has had no anginal issues since. He has resistant HTN. He was told he almost didn't get into the [employer] because of it. His B/P at home runs 0000000 systolic. He denies any dyspnea. He watches his diet. He has noted some fatigue, his Labetalol had been decreased previously for bradycardia. He has not had syncope or pre syncope.   Current Outpatient Prescriptions  Medication Sig Dispense Refill  . amLODipine (NORVASC) 10 MG tablet Take 10 mg by mouth daily.        Marland Kitchen aspirin 81 MG tablet Take 81 mg by mouth daily.        Marland Kitchen Bioflavonoid Products (SUPER C-500) TABS Take 1 tablet by mouth daily.      . cholecalciferol (VITAMIN D) 400 UNITS TABS tablet Take 400 Units by mouth daily.      . Cyanocobalamin (VITAMIN B-12 CR PO) Take 5,000 mg by mouth daily.      Marland Kitchen glimepiride (AMARYL) 4 MG tablet Take 4 mg by mouth 2 (two) times daily.        Marland Kitchen labetalol (NORMODYNE) 200 MG tablet Take 0.5 tablets (100 mg total) by mouth 2 (two) times daily.      Marland Kitchen losartan-hydrochlorothiazide (HYZAAR) 100-25 MG per tablet Take 1 tablet by mouth daily.        Marland Kitchen MAGNESIUM OXIDE PO Take 1 tablet by mouth at bedtime.      . metformin (FORTAMET) 1000 MG (OSM) 24 hr tablet Take 1,000 mg by mouth 2 (two) times daily with a meal.        . Potassium Gluconate 595 MG CAPS Take 1 capsule by mouth daily.      Marland Kitchen doxazosin (CARDURA) 2 MG tablet Take 1 tablet (2 mg total) by mouth at bedtime.  30 tablet  5   No current facility-administered medications for this visit.    Allergies  Allergen Reactions  . Other Other (See Comments)    No  BLOOD PRODUCTS - Pt is Jeohovah's Witness  . Pravastatin     Myalgias    History   Social History  . Marital Status: Married    Spouse Name: [name]     Number of Children: 1  . Years of Education: N/A   Occupational History  .     Social History Main Topics  . Smoking status: Former Smoker    Types: Cigarettes    Quit date: 05/27/1957  . Smokeless tobacco: Never Used  . Alcohol Use: No  . Drug Use: No  . Sexual Activity: Not on file   Other Topics Concern  . Not on file   Social History Narrative   Married father of one.  Previously uses stationary bike routinely.  Now reduced due to other social stressors.   Quit smoking 50 years ago.  Does not drink alcohol     Review of Systems: General: negative for chills, fever, night sweats or weight changes.  Cardiovascular: negative for chest pain, dyspnea on exertion, edema, orthopnea, palpitations, paroxysmal nocturnal dyspnea or shortness of breath Dermatological: negative for rash Respiratory: negative for cough or wheezing Urologic: negative for hematuria Abdominal: negative for nausea, vomiting, diarrhea, bright red blood per rectum, melena, or hematemesis Neurologic: negative for visual changes, syncope, or  dizziness All other systems reviewed and are otherwise negative except as noted above.    Blood pressure 160/80, pulse 53, height 6\' 1"  (1.854 m), weight 231 lb (104.781 kg).  General appearance: alert, cooperative and no distress Neck: no carotid bruit and no JVD Lungs: clear to auscultation bilaterally Heart: regular rate and rhythm  EKG NSR, SB-52  ASSESSMENT AND PLAN:   S/P CABG x 2 CABG x 2 2012. No angina  HTN (hypertension) He has always had high B/P. Today's is about his normal -162/78  DM2 (diabetes mellitus, type 2) .  Hyperlipidemia LDL goal <70 Statin intol- myalgias  Bradycardia HR 52 today- he admits to some exertional fatigue, no syncope   PLAN  I cut his Labetalol to 100mg   BID. I added Cardura 2 mg QHS. He can follow up with Dr [name] in 6 months. He'll keep an eye on his B/P at home.   [name] [name] 08/31/2013 3:12 PM

## 2013-08-31 NOTE — Assessment & Plan Note (Signed)
HR 52 today- he admits to some exertional fatigue, no syncope

## 2013-08-31 NOTE — Assessment & Plan Note (Addendum)
CABG x 2 2012. No angina

## 2013-08-31 NOTE — Patient Instructions (Addendum)
Decrease Labetalol to 100 mg twice a day Start Doxazosin 2mg  daily at bedtime Your physician recommends that you schedule a follow-up appointment in: 6  Months with Dr Ellyn Hack

## 2013-08-31 NOTE — Assessment & Plan Note (Signed)
He has always had high B/P. Today's is about his normal -162/78

## 2013-10-18 DIAGNOSIS — E559 Vitamin D deficiency, unspecified: Secondary | ICD-10-CM | POA: Diagnosis not present

## 2013-10-18 DIAGNOSIS — E1139 Type 2 diabetes mellitus with other diabetic ophthalmic complication: Secondary | ICD-10-CM | POA: Diagnosis not present

## 2013-10-18 DIAGNOSIS — E1165 Type 2 diabetes mellitus with hyperglycemia: Secondary | ICD-10-CM | POA: Diagnosis not present

## 2013-10-18 DIAGNOSIS — N182 Chronic kidney disease, stage 2 (mild): Secondary | ICD-10-CM | POA: Diagnosis not present

## 2013-10-18 DIAGNOSIS — Z125 Encounter for screening for malignant neoplasm of prostate: Secondary | ICD-10-CM | POA: Diagnosis not present

## 2013-10-18 DIAGNOSIS — I10 Essential (primary) hypertension: Secondary | ICD-10-CM | POA: Diagnosis not present

## 2013-10-23 ENCOUNTER — Observation Stay (HOSPITAL_COMMUNITY)
Admission: EM | Admit: 2013-10-23 | Discharge: 2013-10-24 | Disposition: A | Discharge status: Home / Self Care | Payer: Medicare Other | Attending: Cardiology | Admitting: Cardiology

## 2013-10-23 ENCOUNTER — Encounter (HOSPITAL_COMMUNITY): Payer: Self-pay | Admitting: Emergency Medicine

## 2013-10-23 ENCOUNTER — Telehealth: Payer: Self-pay | Admitting: Cardiology

## 2013-10-23 ENCOUNTER — Emergency Department (HOSPITAL_COMMUNITY): Payer: Medicare Other

## 2013-10-23 DIAGNOSIS — R0602 Shortness of breath: Secondary | ICD-10-CM | POA: Diagnosis not present

## 2013-10-23 DIAGNOSIS — R079 Chest pain, unspecified: Principal | ICD-10-CM | POA: Insufficient documentation

## 2013-10-23 DIAGNOSIS — Z951 Presence of aortocoronary bypass graft: Secondary | ICD-10-CM | POA: Insufficient documentation

## 2013-10-23 DIAGNOSIS — Z87891 Personal history of nicotine dependence: Secondary | ICD-10-CM | POA: Diagnosis not present

## 2013-10-23 DIAGNOSIS — I2510 Atherosclerotic heart disease of native coronary artery without angina pectoris: Secondary | ICD-10-CM | POA: Insufficient documentation

## 2013-10-23 DIAGNOSIS — I10 Essential (primary) hypertension: Secondary | ICD-10-CM | POA: Insufficient documentation

## 2013-10-23 DIAGNOSIS — E1169 Type 2 diabetes mellitus with other specified complication: Secondary | ICD-10-CM | POA: Diagnosis present

## 2013-10-23 DIAGNOSIS — I252 Old myocardial infarction: Secondary | ICD-10-CM | POA: Diagnosis not present

## 2013-10-23 DIAGNOSIS — Z7982 Long term (current) use of aspirin: Secondary | ICD-10-CM | POA: Diagnosis not present

## 2013-10-23 DIAGNOSIS — E785 Hyperlipidemia, unspecified: Secondary | ICD-10-CM | POA: Diagnosis not present

## 2013-10-23 DIAGNOSIS — I714 Abdominal aortic aneurysm, without rupture, unspecified: Secondary | ICD-10-CM | POA: Diagnosis not present

## 2013-10-23 DIAGNOSIS — E119 Type 2 diabetes mellitus without complications: Secondary | ICD-10-CM | POA: Insufficient documentation

## 2013-10-23 DIAGNOSIS — E669 Obesity, unspecified: Secondary | ICD-10-CM

## 2013-10-23 DIAGNOSIS — G4730 Sleep apnea, unspecified: Secondary | ICD-10-CM | POA: Insufficient documentation

## 2013-10-23 LAB — CBC WITH DIFFERENTIAL/PLATELET
Basophils Absolute: 0.1 10*3/uL (ref 0.0–0.1)
Basophils Relative: 2 % — ABNORMAL HIGH (ref 0–1)
EOS ABS: 0.3 10*3/uL (ref 0.0–0.7)
Eosinophils Relative: 6 % — ABNORMAL HIGH (ref 0–5)
HCT: 38.1 % — ABNORMAL LOW (ref 39.0–52.0)
Hemoglobin: 12.9 g/dL — ABNORMAL LOW (ref 13.0–17.0)
LYMPHS PCT: 29 % (ref 12–46)
Lymphs Abs: 1.5 10*3/uL (ref 0.7–4.0)
MCH: 29.5 pg (ref 26.0–34.0)
MCHC: 33.9 g/dL (ref 30.0–36.0)
MCV: 87.2 fL (ref 78.0–100.0)
Monocytes Absolute: 1 10*3/uL (ref 0.1–1.0)
Monocytes Relative: 18 % — ABNORMAL HIGH (ref 3–12)
NEUTROS PCT: 45 % (ref 43–77)
Neutro Abs: 2.4 10*3/uL (ref 1.7–7.7)
Platelets: 260 10*3/uL (ref 150–400)
RBC: 4.37 MIL/uL (ref 4.22–5.81)
RDW: 14.6 % (ref 11.5–15.5)
WBC: 5.3 10*3/uL (ref 4.0–10.5)

## 2013-10-23 LAB — COMPREHENSIVE METABOLIC PANEL
ALT: 21 U/L (ref 0–53)
AST: 24 U/L (ref 0–37)
Albumin: 3.8 g/dL (ref 3.5–5.2)
Alkaline Phosphatase: 72 U/L (ref 39–117)
BILIRUBIN TOTAL: 0.4 mg/dL (ref 0.3–1.2)
BUN: 25 mg/dL — ABNORMAL HIGH (ref 6–23)
CO2: 28 meq/L (ref 19–32)
CREATININE: 1.03 mg/dL (ref 0.50–1.35)
Calcium: 9.5 mg/dL (ref 8.4–10.5)
Chloride: 100 mEq/L (ref 96–112)
GFR calc Af Amer: 77 mL/min — ABNORMAL LOW (ref >90)
GFR, EST NON AFRICAN AMERICAN: 66 mL/min — AB (ref >90)
GLUCOSE: 192 mg/dL — AB (ref 70–99)
Potassium: 4.3 mEq/L (ref 3.7–5.3)
Sodium: 140 mEq/L (ref 137–147)
Total Protein: 7.1 g/dL (ref 6.0–8.3)

## 2013-10-23 LAB — I-STAT TROPONIN, ED: Troponin i, poc: 0 ng/mL (ref 0.00–0.08)

## 2013-10-23 LAB — PRO B NATRIURETIC PEPTIDE: Pro B Natriuretic peptide (BNP): 193.3 pg/mL (ref 0–450)

## 2013-10-23 MED ORDER — SODIUM CHLORIDE 0.9 % IJ SOLN: 3.0000 mL | INTRAMUSCULAR | Status: DC | PRN

## 2013-10-23 MED ORDER — HEPARIN SODIUM (PORCINE) 5000 UNIT/ML IJ SOLN
4000.0000 [IU] | Freq: Once | INTRAMUSCULAR | Status: AC
  Administered 2013-10-23: 4000 [IU] via INTRAVENOUS
  Filled 2013-10-23: qty 1

## 2013-10-23 MED ORDER — NITROGLYCERIN 0.4 MG SL SUBL
0.4000 mg | SUBLINGUAL_TABLET | SUBLINGUAL | Status: DC | PRN
  Filled 2013-10-23: qty 1

## 2013-10-23 MED ORDER — CHOLECALCIFEROL 10 MCG (400 UNIT) PO TABS
400.0000 [IU] | ORAL_TABLET | Freq: Every evening | ORAL | Status: DC
  Administered 2013-10-24: 400 [IU] via ORAL
  Filled 2013-10-23: qty 1

## 2013-10-23 MED ORDER — HEPARIN BOLUS VIA INFUSION: 4000.0000 [IU] | Freq: Once | INTRAVENOUS | Status: DC

## 2013-10-23 MED ORDER — INSULIN ASPART 100 UNIT/ML ~~LOC~~ SOLN
0.0000 [IU] | Freq: Three times a day (TID) | SUBCUTANEOUS | Status: DC
  Administered 2013-10-24: 11 [IU] via SUBCUTANEOUS

## 2013-10-23 MED ORDER — ASPIRIN EC 81 MG PO TBEC: 81.0000 mg | DELAYED_RELEASE_TABLET | Freq: Every day | ORAL | Status: DC

## 2013-10-23 MED ORDER — LABETALOL HCL 100 MG PO TABS
100.0000 mg | ORAL_TABLET | Freq: Two times a day (BID) | ORAL | Status: DC
  Administered 2013-10-24: 100 mg via ORAL
  Filled 2013-10-23 (×4): qty 1

## 2013-10-23 MED ORDER — ASPIRIN 81 MG PO CHEW: 324.0000 mg | CHEWABLE_TABLET | ORAL | Status: DC

## 2013-10-23 MED ORDER — SODIUM CHLORIDE 0.9 % IJ SOLN: 3.0000 mL | Freq: Two times a day (BID) | INTRAMUSCULAR | Status: DC

## 2013-10-23 MED ORDER — ASPIRIN 325 MG PO TABS
325.0000 mg | ORAL_TABLET | Freq: Once | ORAL | Status: AC
  Administered 2013-10-23: 325 mg via ORAL
  Filled 2013-10-23: qty 1

## 2013-10-23 MED ORDER — ASPIRIN 300 MG RE SUPP: 300.0000 mg | RECTAL | Status: DC

## 2013-10-23 MED ORDER — LOSARTAN POTASSIUM 50 MG PO TABS
100.0000 mg | ORAL_TABLET | Freq: Every day | ORAL | Status: DC
  Administered 2013-10-24: 100 mg via ORAL
  Filled 2013-10-23 (×2): qty 2

## 2013-10-23 MED ORDER — POTASSIUM GLUCONATE 595 MG PO CAPS: 595.0000 mg | ORAL_CAPSULE | Freq: Every morning | ORAL | Status: DC

## 2013-10-23 MED ORDER — ASPIRIN EC 81 MG PO TBEC
81.0000 mg | DELAYED_RELEASE_TABLET | Freq: Every morning | ORAL | Status: DC
  Administered 2013-10-24: 81 mg via ORAL
  Filled 2013-10-23: qty 1

## 2013-10-23 MED ORDER — LOSARTAN POTASSIUM-HCTZ 100-25 MG PO TABS: 1.0000 | ORAL_TABLET | Freq: Every morning | ORAL | Status: DC

## 2013-10-23 MED ORDER — HEPARIN (PORCINE) IN NACL 100-0.45 UNIT/ML-% IJ SOLN
1650.0000 [IU]/h | INTRAMUSCULAR | Status: DC
  Administered 2013-10-23: 1400 [IU]/h via INTRAVENOUS
  Administered 2013-10-24: 1650 [IU]/h via INTRAVENOUS
  Filled 2013-10-23 (×3): qty 250

## 2013-10-23 MED ORDER — HEPARIN BOLUS VIA INFUSION
4000.0000 [IU] | Freq: Once | INTRAVENOUS | Status: DC
  Filled 2013-10-23: qty 4000

## 2013-10-23 MED ORDER — NITROGLYCERIN 0.4 MG SL SUBL: 0.4000 mg | SUBLINGUAL_TABLET | SUBLINGUAL | Status: DC | PRN

## 2013-10-23 MED ORDER — DOXAZOSIN MESYLATE 2 MG PO TABS
2.0000 mg | ORAL_TABLET | Freq: Every day | ORAL | Status: DC
  Administered 2013-10-24: 2 mg via ORAL
  Filled 2013-10-23 (×2): qty 1

## 2013-10-23 MED ORDER — HYDROCHLOROTHIAZIDE 25 MG PO TABS
25.0000 mg | ORAL_TABLET | Freq: Every day | ORAL | Status: DC
  Administered 2013-10-24: 25 mg via ORAL
  Filled 2013-10-23 (×2): qty 1

## 2013-10-23 MED ORDER — AMLODIPINE BESYLATE 10 MG PO TABS
10.0000 mg | ORAL_TABLET | Freq: Every day | ORAL | Status: DC
  Administered 2013-10-24: 10 mg via ORAL
  Filled 2013-10-23 (×2): qty 1

## 2013-10-23 MED ORDER — SODIUM CHLORIDE 0.9 % IV SOLN: 250.0000 mL | INTRAVENOUS | Status: DC | PRN

## 2013-10-23 MED ORDER — MAGNESIUM OXIDE 400 MG PO TABS
400.0000 mg | ORAL_TABLET | Freq: Every evening | ORAL | Status: DC
Start: 2013-10-24 — End: 2013-10-24
  Administered 2013-10-24: 400 mg via ORAL
  Filled 2013-10-23: qty 1

## 2013-10-23 NOTE — ED Notes (Signed)
PT has cardiac history and reports chest pain that started 3 days ago.  Noted sob on exertion and weakness.

## 2013-10-23 NOTE — ED Provider Notes (Signed)
CSN: [id number]     Arrival date & time 10/23/13  1741 History   First MD Initiated Contact with Patient 10/23/13 1828     Chief Complaint  Patient presents with  . Chest Pain  . Shortness of Breath     (Consider location/radiation/quality/duration/timing/severity/associated sxs/prior Treatment) Patient is a 80 y.o. male presenting with chest pain. The history is provided by the patient.  Chest Pain Pain location:  L chest Pain quality: not burning   Pain quality comment:  "feels like wires" Pain radiates to:  Does not radiate Pain radiates to the back: no   Pain severity:  Moderate Onset quality:  Gradual Duration:  5 days Timing:  Intermittent Progression:  Worsening Chronicity:  New Context comment:  With exertion, when working in his shop Relieved by:  Rest Worsened by:  Exertion Associated symptoms: no cough, no fever and no shortness of breath     Past Medical History  Diagnosis Date  . Non-STEMI (non-ST elevated myocardial infarction) July 2012    Ostial LAD 70% (2 posterior left main for PCI), subtotal occlusion of the RCA  . CAD in native artery July 2012    Referred for CABG  . 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 Servando Snare)  . Postoperative atrial fibrillation     Post CABG, no sign recurrence  . HTN (hypertension)   . Type II diabetes mellitus with complication     CAD, occasions  . Hyperlipidemia LDL goal <70 July 2012    Monitor by PCP  . 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 Refusal of blood transfusions as patient is Jehovah's Witness   . Sleep apnea   . Abdominal aneurysm     mild aneursymal dilatation - 04/23/2011   Past Surgical History  Procedure Laterality Date  . Cardiac catheterization  12/11/2010    Dr. Chase Picket - subsequent cath - normal LV systolic function, no renal artery stenosis, severe 2-vessel disease with subtotaled RCA prox and distal 60% lesion and complex 70% area of  narrowing of ostium of LAD  . Coronary artery bypass graft  12/15/2010    X2, Dr Servando Snare; LIMA to LAD, SVG to PDA;   Marland Kitchen Cystoscopy with urethral dilatation    . Transthoracic echocardiogram  07/28/2012    EF 55-60%, severe conc hypertrophy, normal systolic function, grade 1 diastolic dysfunction; AS   Family History  Problem Relation Age of Onset  . Heart Problems Father 58   History  Substance Use Topics  . Smoking status: Former Smoker    Types: Cigarettes    Quit date: 05/27/1957  . Smokeless tobacco: Never Used  . Alcohol Use: No    Review of Systems  Constitutional: Negative for fever.  Respiratory: Negative for cough and shortness of breath.   Cardiovascular: Positive for chest pain.  All other systems reviewed and are negative.     Allergies  Other and Pravastatin  Home Medications   Prior to Admission medications   Medication Sig Start Date End Date Taking? Authorizing Provider  amLODipine (NORVASC) 10 MG tablet Take 10 mg by mouth daily.      Historical Provider, MD  aspirin 81 MG tablet Take 81 mg by mouth daily.      Historical Provider, MD  Bioflavonoid Products (SUPER C-500) TABS Take 1 tablet by mouth daily.    Historical Provider, MD  cholecalciferol (VITAMIN D) 400 UNITS TABS tablet Take  400 Units by mouth daily.    Historical Provider, MD  Cyanocobalamin (VITAMIN B-12 CR PO) Take 5,000 mg by mouth daily.    Historical Provider, MD  doxazosin (CARDURA) 2 MG tablet Take 1 tablet (2 mg total) by mouth at bedtime. 08/31/13   Erlene Quan, PA-C  glimepiride (AMARYL) 4 MG tablet Take 4 mg by mouth 2 (two) times daily.      Historical Provider, MD  labetalol (NORMODYNE) 200 MG tablet Take 0.5 tablets (100 mg total) by mouth 2 (two) times daily. 08/31/13   Erlene Quan, PA-C  losartan-hydrochlorothiazide (HYZAAR) 100-25 MG per tablet Take 1 tablet by mouth daily.      Historical Provider, MD  MAGNESIUM OXIDE PO Take 1 tablet by mouth at bedtime.    Historical  Provider, MD  metformin (FORTAMET) 1000 MG (OSM) 24 hr tablet Take 1,000 mg by mouth 2 (two) times daily with a meal.      Historical Provider, MD  Potassium Gluconate 595 MG CAPS Take 1 capsule by mouth daily.    Historical Provider, MD   BP 159/78  Pulse 58  Temp(Src) 97.7 F (36.5 C) (Oral)  Resp 14  SpO2 95% Physical Exam  Nursing note and vitals reviewed. Constitutional: He is oriented to person, place, and time. He appears well-developed and well-nourished. No distress.  HENT:  Head: Normocephalic and atraumatic.  Mouth/Throat: Oropharynx is clear and moist. No oropharyngeal exudate.  Eyes: EOM are normal. Pupils are equal, round, and reactive to light.  Neck: Normal range of motion. Neck supple.  Cardiovascular: Normal rate and regular rhythm.  Exam reveals no friction rub.   No murmur heard. Pulmonary/Chest: Effort normal and breath sounds normal. No respiratory distress. He has no wheezes. He has no rales.  Abdominal: Soft. He exhibits no distension. There is no tenderness. There is no rebound.  Musculoskeletal: Normal range of motion. He exhibits no edema.  Neurological: He is alert and oriented to person, place, and time. No cranial nerve deficit. He exhibits normal muscle tone. Coordination normal.  Skin: No rash noted. He is not diaphoretic.    ED Course  Procedures (including critical care time) Labs Review Labs Reviewed  COMPREHENSIVE METABOLIC PANEL - Abnormal; Notable for the following:    Glucose, Bld 192 (*)    BUN 25 (*)    GFR calc non Af Amer 66 (*)    GFR calc Af Amer 77 (*)    All other components within normal limits  CBC WITH DIFFERENTIAL - Abnormal; Notable for the following:    Hemoglobin 12.9 (*)    HCT 38.1 (*)    Monocytes Relative 18 (*)    Eosinophils Relative 6 (*)    Basophils Relative 2 (*)    All other components within normal limits  PRO B NATRIURETIC PEPTIDE  I-STAT TROPOININ, ED    Imaging Review No results found.   EKG  Interpretation   Date/Time:  Monday October 23 2013 17:52:36 EDT Ventricular Rate:  70 PR Interval:  206 QRS Duration: 86 QT Interval:  380 QTC Calculation: 410 R Axis:   8 Text Interpretation:  Normal sinus rhythm with sinus arrhythmia  Nonspecific ST abnormality Similar to prior Confirmed by Mingo Amber  MD, [place]  ([id number]) on 10/23/2013 6:10:30 PM      MDM   Final diagnoses:  Chest pain    72M with hx of NSTEMI, DM, s/p CABG in 11/2010 presents with CP. L sided, sticking, "wires" type sensation, radiating to left  arm. On/off for past 5 days, worsening. Worse with mild exertion. Couldn't do things in his shop today due to his chest pain. Spoke with his Cardiologist office, which recommended coming to the ED.  Patient denies SOB, N/V. Mild 4/10 pain at this time. Will give NTG, ASA. Labs ok. Cards consulted. Dr. Ron Parker asked for heparin, will see and admit patient.  Osvaldo Shipper, MD 10/23/13 [phone]

## 2013-10-23 NOTE — ED Notes (Signed)
PT refuses nitroglycerin sl

## 2013-10-23 NOTE — Telephone Encounter (Signed)
Returned a call to patient. He informs me that over the last 2-3 days he has been experiencing symptoms of a "sticking" discomfort in his chest along with SOB. He notices these symptoms more with activity. Recommended to patient that due to his cardiac history it is our recommendation that he go to the nearest ED for evaluation as his symptoms are new since his last appointment with Lurena Joiner in April. Patient voiced his understanding of this, however he did not say that he is going to go to the ED.

## 2013-10-23 NOTE — Progress Notes (Signed)
ANTICOAGULATION CONSULT NOTE - Initial Consult  Pharmacy Consult for Heparin Indication: chest pain/ACS  Allergies  Allergen Reactions  . Other Other (See Comments)    No  BLOOD PRODUCTS - Pt is Jeohovah's Witness  . Pravastatin     Myalgias    Patient Measurements:   Heparin Dosing Weight: 101 kg (based on last weight from 08/2013)  Vital Signs: Temp: 97.7 F (36.5 C) (06/08 1818) Temp src: Oral (06/08 1818) BP: 162/73 mmHg (06/08 2036) Pulse Rate: 60 (06/08 2000)  Labs:  Recent Labs  10/23/13 1755  HGB 12.9*  HCT 38.1*  PLT 260  CREATININE 1.03    The CrCl is unknown because both a height and weight (above a minimum accepted value) are required for this calculation.   Medical History: Past Medical History  Diagnosis Date  . Non-STEMI (non-ST elevated myocardial infarction) July 2012    Ostial LAD 70% (2 posterior left main for PCI), subtotal occlusion of the RCA  . CAD in native artery July 2012    Referred for CABG  . 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 Servando Snare)  . Postoperative atrial fibrillation     Post CABG, no sign recurrence  . HTN (hypertension)   . Type II diabetes mellitus with complication     CAD, occasions  . Hyperlipidemia LDL goal <70 July 2012    Monitor by PCP  . 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 Refusal of blood transfusions as patient is Jehovah's Witness   . Sleep apnea   . Abdominal aneurysm     mild aneursymal dilatation - 04/23/2011    Medications:  See PTA medication list  Assessment: 80 y/o male who presents to the ED with chest pain. Pharmacy consulted to begin IV heparin. Plan is for cath if troponins are positive and stress test if negative. First troponin is negative. No bleeding noted, CBC is normal. Heparin 4000 unit IV bolus given at 20:07.  Goal of Therapy:  Heparin level 0.3-0.7 units/ml Monitor platelets by anticoagulation protocol: Yes   Plan:  -  Heparin IV at 1400 units/hr - 8 hr heparin level - Daily heparin level and CBC  [facility], [place].D., BCPS Clinical Pharmacist Pager: [phone] 10/23/2013 8:58 PM

## 2013-10-23 NOTE — ED Notes (Signed)
Verified heparin with Danielle RN

## 2013-10-23 NOTE — H&P (Addendum)
Patient ID: TREDARIUS TENEYCK MRN: [id number], DOB/AGE: August 20, 1930   Admit date: 10/23/2013   Primary Physician: Leonides Sake, MD Primary Cardiologist: Dr. Ellyn Hack  Pt. Profile:  [name] is a 80 y.o. male with a history of CAD s/p CABG x2 (2012), HLD, OSA and resistant HTN who presents to [facility] ED with chest pain .  His EKG reveals no significant change. His first troponin is normal. His pain is difficult to assess. There may be an exertional component to it. However it can come on in a random fashion. There may be some increase in shortness of breath.   Problem List  Past Medical History  Diagnosis Date  . Non-STEMI (non-ST elevated myocardial infarction) July 2012    Ostial LAD 70% (2 posterior left main for PCI), subtotal occlusion of the RCA  . CAD in native artery July 2012    Referred for CABG  . 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 Servando Snare)  . Postoperative atrial fibrillation     Post CABG, no sign recurrence  . HTN (hypertension)   . Type II diabetes mellitus with complication     CAD, occasions  . Hyperlipidemia LDL goal <70 July 2012    Monitor by PCP  . 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 Refusal of blood transfusions as patient is Jehovah's Witness   . Sleep apnea   . Abdominal aneurysm     mild aneursymal dilatation - 04/23/2011    Past Surgical History  Procedure Laterality Date  . Cardiac catheterization  12/11/2010    Dr. Chase Picket - subsequent cath - normal LV systolic function, no renal artery stenosis, severe 2-vessel disease with subtotaled RCA prox and distal 60% lesion and complex 70% area of narrowing of ostium of LAD  . Coronary artery bypass graft  12/15/2010    X2, Dr Servando Snare; LIMA to LAD, SVG to PDA;   Marland Kitchen Cystoscopy with urethral dilatation    . Transthoracic echocardiogram  07/28/2012    EF 55-60%, severe conc hypertrophy, normal systolic function, grade 1 diastolic  dysfunction; AS     Allergies  Allergies  Allergen Reactions  . Other Other (See Comments)    No  BLOOD PRODUCTS - Pt is Jeohovah's Witness  . Pravastatin     Myalgias     Home Medications  Prior to Admission medications   Medication Sig Start Date End Date Taking? Authorizing Provider  amLODipine (NORVASC) 10 MG tablet Take 10 mg by mouth daily.    Yes Historical Provider, MD  aspirin EC 81 MG tablet Take 81 mg by mouth every morning.   Yes Historical Provider, MD  Bioflavonoid Products (SUPER C-500) TABS Take 1 tablet by mouth every evening.    Yes Historical Provider, MD  cholecalciferol (VITAMIN D) 400 UNITS TABS tablet Take 400 Units by mouth every evening.    Yes Historical Provider, MD  Cyanocobalamin 5000 MCG SUBL Take 5,000 mcg by mouth every morning.   Yes Historical Provider, MD  doxazosin (CARDURA) 2 MG tablet Take 1 tablet (2 mg total) by mouth at bedtime. 08/31/13  Yes Luke K Kilroy, PA-C  glimepiride (AMARYL) 4 MG tablet Take 4 mg by mouth 2 (two) times daily with a meal.    Yes Historical Provider, MD  labetalol (NORMODYNE) 200 MG tablet Take 0.5 tablets (100 mg total) by mouth 2 (two) times daily. 08/31/13  Yes Lurena Joiner  K Kilroy, PA-C  losartan-hydrochlorothiazide (HYZAAR) 100-25 MG per tablet Take 1 tablet by mouth every morning.    Yes Historical Provider, MD  magnesium oxide (MAG-OX) 400 MG tablet Take 400 mg by mouth every evening.    Yes Historical Provider, MD  metformin (FORTAMET) 1000 MG (OSM) 24 hr tablet Take 1,000 mg by mouth 2 (two) times daily with a meal.     Yes Historical Provider, MD  Potassium Gluconate 595 MG CAPS Take 595 mg by mouth every morning.    Yes Historical Provider, MD    Family History  Family History  Problem Relation Age of Onset  . Heart Problems Father 91    Social History  History   Social History  . Marital Status: Married    Spouse Name: [name]    Number of Children: 1  . Years of Education: N/A   Occupational History  .      Social History Main Topics  . Smoking status: Former Smoker    Types: Cigarettes    Quit date: 05/27/1957  . Smokeless tobacco: Never Used  . Alcohol Use: No  . Drug Use: No  . Sexual Activity: Not on file   Other Topics Concern  . Not on file   Social History Narrative   Married father of one.  Previously uses stationary bike routinely.  Now reduced due to other social stressors.   Quit smoking 50 years ago.  Does not drink alcohol     Review of Systems  Patient denies fever, chills, headache, sweats, rash, change in vision, change in hearing, cough, nausea vomiting, urinary symptoms. All other systems are reviewed and are negative.  Physical Exam  Blood pressure 156/73, pulse 59, temperature 97.7 F (36.5 C), temperature source Oral, resp. rate 18, SpO2 96.00%.   Patient is oriented to person time and place. Affect is normal. He is here with his pastor. Head is atraumatic. Sclera and conjunctiva are normal. There is no jugulovenous distention. Lungs are clear. Respiratory effort is nonlabored. Cardiac exam reveals S1 and S2. The abdomen is soft. There is trace peripheral edema. There are no musculoskeletal deformities. There are no skin rashes.  Labs  No results found for this basename: CKTOTAL, CKMB, TROPONINI,  in the last 72 hours Lab Results  Component Value Date   WBC 5.3 10/23/2013   HGB 12.9* 10/23/2013   HCT 38.1* 10/23/2013   MCV 87.2 10/23/2013   PLT 260 10/23/2013    Recent Labs Lab 10/23/13 1755  NA 140  K 4.3  CL 100  CO2 28  BUN 25*  CREATININE 1.03  CALCIUM 9.5  PROT 7.1  BILITOT 0.4  ALKPHOS 72  ALT 21  AST 24  GLUCOSE 192*   Lab Results  Component Value Date   CHOL 185 12/10/2010   HDL 48 12/10/2010   LDLCALC 109* 12/10/2010   TRIG 141 12/10/2010   No results found for this basename: DDIMER     Radiology/Studies  Dg Chest 2 View  10/23/2013   CLINICAL DATA:  Chest pain shortness of breath.  EXAM: CHEST  2 VIEW  COMPARISON:  01/26/2011   FINDINGS: Changes in median sternotomy for CABG. Heart size is mildly enlarged and appears stable. Thoracic aorta contours tortuous and stable. Pulmonary vascularity is normal. The lungs are well expanded and clear negative for pleural effusion or airspace disease. Negative for pneumothorax. No acute osseous abnormality identified. Imaged upper abdomen is unremarkable.  IMPRESSION: Mild cardiomegaly.  No acute cardiopulmonary disease.  Electronically Signed   By: Curlene Dolphin M.D.   On: 10/23/2013 19:44    ECG  HR 70 Normal sinus rhythm with sinus arrhythmia Nonspecific ST abnormality  ASSESSMENT AND PLAN  [name] is a 80 y.o. male with a history of CAD s/p CABG x2 (2012), HLD, OSA and resistant HTN who presents to [facility] ED with chest pain    Chest pain- in the setting of known disease.  He is stable at this time. We will watch his enzymes during the night. If his enzymes turn positive cardiac catheterization should be done. If his enzymes remain normal, I feel that we should proceed with a pharmacologic nuclear stress test in the hospital tomorrow. -- Troponin neg x1; ECG with no acute ST or TW changes -- Continue heparin gtt  HTN- His B/P at home runs 0000000 systolic. Currently well controlled.   DM- SSI. Metformin held in the case he need a cardiac cath.     Signed, Perry Mount, PA-C 10/23/2013, 7:46 PM Patient seen and examined. I agree with the assessment and plan as detailed above. See also my additional thoughts below.   I have reviewed all the information with Ms. Vertell Limber. My thoughts are reflected in the assessment and plan above that I have adjusted. The plan will be to watch his enzymes. If they're positive, proceed with catheterization tomorrow. If they're negative, proceed with pharmacologic stress test in the hospital tomorrow.  I have ordered a nuclear scan to be done if the troponins are normal.  Carlena Bjornstad, MD, [facility] 10/23/2013 7:57 PM

## 2013-10-23 NOTE — ED Notes (Signed)
Patient transported to X-ray 

## 2013-10-23 NOTE — Telephone Encounter (Signed)
Please call ,been having some discomfort in his chest and sometimes short of breath.He thought he might need to bee seen by somebody.

## 2013-10-24 ENCOUNTER — Encounter (HOSPITAL_COMMUNITY): Payer: Self-pay | Admitting: Physician Assistant

## 2013-10-24 ENCOUNTER — Observation Stay (HOSPITAL_COMMUNITY): Payer: Medicare Other

## 2013-10-24 DIAGNOSIS — R079 Chest pain, unspecified: Secondary | ICD-10-CM

## 2013-10-24 DIAGNOSIS — E119 Type 2 diabetes mellitus without complications: Secondary | ICD-10-CM | POA: Diagnosis not present

## 2013-10-24 DIAGNOSIS — I2510 Atherosclerotic heart disease of native coronary artery without angina pectoris: Secondary | ICD-10-CM | POA: Diagnosis not present

## 2013-10-24 DIAGNOSIS — I10 Essential (primary) hypertension: Secondary | ICD-10-CM | POA: Diagnosis not present

## 2013-10-24 LAB — HEMOGLOBIN A1C
Hgb A1c MFr Bld: 8.3 % — ABNORMAL HIGH (ref <5.7)
Mean Plasma Glucose: 192 mg/dL — ABNORMAL HIGH (ref <117)

## 2013-10-24 LAB — COMPREHENSIVE METABOLIC PANEL
ALBUMIN: 3.6 g/dL (ref 3.5–5.2)
ALK PHOS: 66 U/L (ref 39–117)
ALT: 19 U/L (ref 0–53)
AST: 29 U/L (ref 0–37)
BUN: 20 mg/dL (ref 6–23)
CHLORIDE: 97 meq/L (ref 96–112)
CO2: 26 mEq/L (ref 19–32)
Calcium: 9.1 mg/dL (ref 8.4–10.5)
Creatinine, Ser: 0.93 mg/dL (ref 0.50–1.35)
GFR calc Af Amer: 90 mL/min — ABNORMAL LOW (ref >90)
GFR calc non Af Amer: 77 mL/min — ABNORMAL LOW (ref >90)
Glucose, Bld: 129 mg/dL — ABNORMAL HIGH (ref 70–99)
POTASSIUM: 4 meq/L (ref 3.7–5.3)
SODIUM: 138 meq/L (ref 137–147)
Total Bilirubin: 0.3 mg/dL (ref 0.3–1.2)
Total Protein: 6.5 g/dL (ref 6.0–8.3)

## 2013-10-24 LAB — CBC
HEMATOCRIT: 38.6 % — AB (ref 39.0–52.0)
Hemoglobin: 12.9 g/dL — ABNORMAL LOW (ref 13.0–17.0)
MCH: 29.1 pg (ref 26.0–34.0)
MCHC: 33.4 g/dL (ref 30.0–36.0)
MCV: 86.9 fL (ref 78.0–100.0)
Platelets: 255 10*3/uL (ref 150–400)
RBC: 4.44 MIL/uL (ref 4.22–5.81)
RDW: 14.6 % (ref 11.5–15.5)
WBC: 6.2 10*3/uL (ref 4.0–10.5)

## 2013-10-24 LAB — TSH: TSH: 1.18 u[IU]/mL (ref 0.350–4.500)

## 2013-10-24 LAB — PROTIME-INR
INR: 1.03 (ref 0.00–1.49)
INR: 1.14 (ref 0.00–1.49)
PROTHROMBIN TIME: 13.3 s (ref 11.6–15.2)
Prothrombin Time: 14.4 seconds (ref 11.6–15.2)

## 2013-10-24 LAB — TROPONIN I
Troponin I: <0.3 ng/mL (ref <0.30)
Troponin I: <0.3 ng/mL (ref <0.30)

## 2013-10-24 LAB — GLUCOSE, CAPILLARY
Glucose-Capillary: 155 mg/dL — ABNORMAL HIGH (ref 70–99)
Glucose-Capillary: 304 mg/dL — ABNORMAL HIGH (ref 70–99)
Glucose-Capillary: 228 mg/dL — ABNORMAL HIGH (ref 70–99)

## 2013-10-24 LAB — LIPID PANEL
Cholesterol: 164 mg/dL (ref 0–200)
HDL: 48 mg/dL (ref >39)
LDL Cholesterol: 93 mg/dL (ref 0–99)
Total CHOL/HDL Ratio: 3.4 RATIO
Triglycerides: 116 mg/dL (ref <150)
VLDL: 23 mg/dL (ref 0–40)

## 2013-10-24 LAB — HEPARIN LEVEL (UNFRACTIONATED): Heparin Unfractionated: 0.2 IU/mL — ABNORMAL LOW (ref 0.30–0.70)

## 2013-10-24 LAB — MRSA PCR SCREENING: MRSA by PCR: NEGATIVE

## 2013-10-24 LAB — MAGNESIUM: Magnesium: 2 mg/dL (ref 1.5–2.5)

## 2013-10-24 MED ORDER — TECHNETIUM TC 99M SESTAMIBI GENERIC - CARDIOLITE
10.0000 | Freq: Once | INTRAVENOUS | Status: AC | PRN
  Administered 2013-10-24: 10 via INTRAVENOUS

## 2013-10-24 MED ORDER — REGADENOSON 0.4 MG/5ML IV SOLN
INTRAVENOUS | Status: AC
  Administered 2013-10-24: 11:00:00
  Filled 2013-10-24: qty 5

## 2013-10-24 MED ORDER — REGADENOSON 0.4 MG/5ML IV SOLN
0.4000 mg | Freq: Once | INTRAVENOUS | Status: AC
  Administered 2013-10-24: 0.4 mg via INTRAVENOUS

## 2013-10-24 MED ORDER — TECHNETIUM TC 99M SESTAMIBI GENERIC - CARDIOLITE
30.0000 | Freq: Once | INTRAVENOUS | Status: AC | PRN
  Administered 2013-10-24: 30 via INTRAVENOUS

## 2013-10-24 NOTE — Discharge Planning (Signed)
PIV d/c'd intact. Pt verbalizes understanding of d/c instructions. Will d/c per WC to private vehicle.

## 2013-10-24 NOTE — Progress Notes (Signed)
Pt. Seen and examined. Agree with the NP/PA-C note as written.  Myoview performed this morning. No further chest pain. Ruled-out for MI. If negative, could be discharged home today. Follow-up with Dr. Ellyn Hack.  Pixie Casino, MD, [facility] Attending Cardiologist [place]

## 2013-10-24 NOTE — Progress Notes (Signed)
Lexiscan myoview completed without complications, + nausea and diaphoresis, no pain.  Nuc results to follow.

## 2013-10-24 NOTE — Progress Notes (Signed)
ANTICOAGULATION CONSULT NOTE - Follow Up Consult  Pharmacy Consult for heparin  Indication: chest pain/ACS  Allergies  Allergen Reactions  . Other Other (See Comments)    No  BLOOD PRODUCTS - Pt is Jeohovah's Witness  . Pravastatin     Myalgias    Patient Measurements: Height: 6' (182.9 cm) Weight: 224 lb 3.3 oz (101.7 kg) IBW/kg (Calculated) : 77.6 Heparin Dosing Weight:   Vital Signs: Temp: 98 F (36.7 C) (06/09 0400) Temp src: Oral (06/09 0400) BP: 153/85 mmHg (06/09 0400) Pulse Rate: 57 (06/09 0400)  Labs:  Recent Labs  10/23/13 1755 10/24/13 0028 10/24/13 0500 10/24/13 0537  HGB 12.9*  --  12.9*  --   HCT 38.1*  --  38.6*  --   PLT 260  --  255  --   LABPROT  --  13.3 14.4  --   INR  --  1.03 1.14  --   HEPARINUNFRC  --   --  0.20*  --   CREATININE 1.03  --   --   --   TROPONINI  --  <0.30  --  <0.30    Estimated Creatinine Clearance: 70.6 ml/min (by C-G formula based on Cr of 1.03).   Medications:  Heparin at 1400 units/hr  Assessment: Heparin below goal at 0.20 with no bleeding noted.   Goal of Therapy:  Heparin level 0.3-0.7 units/ml Monitor platelets by anticoagulation protocol: Yes   Plan:  Increase heparin to 1650 units/hr and recheck HL in 8 hours   Curlene Dolphin 10/24/2013,6:35 AM

## 2013-10-24 NOTE — Progress Notes (Signed)
Subjective: No complaints  Objective: Vital signs in last 24 hours: Temp:  [97.7 F (36.5 C)-99 F (37.2 C)] 99 F (37.2 C) (06/09 0750) Pulse Rate:  [31-70] 57 (06/09 0400) Resp:  [12-21] 15 (06/09 0750) BP: (153-190)/(53-93) 167/83 mmHg (06/09 0750) SpO2:  [93 %-99 %] 94 % (06/09 0750) Weight:  [224 lb 3.3 oz (101.7 kg)] 224 lb 3.3 oz (101.7 kg) (06/09 0500) Weight change:    Intake/Output from previous day: -1275 06/08 0701 - 06/09 0700 In: 131.5 [I.V.:131.5] Out: 1390 [Urine:1390] Intake/Output this shift: Total I/O In: 16.5 [I.V.:16.5] Out: 300 [Urine:300]  PE: General:Pleasant affect, NAD Skin:Warm and dry, brisk capillary refill HEENT:normocephalic, sclera clear, mucus membranes moist Heart:S1S2 RRR without murmur, gallup, rub or click Lungs:clear without rales, rhonchi, or wheezes [id number], non tender, + BS, do not palpate liver spleen or masses Ext:no lower ext edema, 2+ pedal pulses, 2+ radial pulses Neuro:alert and oriented, MAE, follows commands, + facial symmetry   Lab Results:  Recent Labs  10/23/13 1755 10/24/13 0500  WBC 5.3 6.2  HGB 12.9* 12.9*  HCT 38.1* 38.6*  PLT 260 255   BMET  Recent Labs  10/23/13 1755 10/24/13 0500  NA 140 138  K 4.3 4.0  CL 100 97  CO2 28 26  GLUCOSE 192* 129*  BUN 25* 20  CREATININE 1.03 0.93  CALCIUM 9.5 9.1    Recent Labs  10/24/13 0028 10/24/13 0537  TROPONINI <0.30 <0.30    Lab Results  Component Value Date   CHOL 164 10/24/2013   HDL 48 10/24/2013   LDLCALC 93 10/24/2013   TRIG 116 10/24/2013   CHOLHDL 3.4 10/24/2013   Lab Results  Component Value Date   HGBA1C 9.8* 12/13/2010     Lab Results  Component Value Date   TSH 1.180 10/24/2013    Hepatic Function Panel  Recent Labs  10/24/13 0500  PROT 6.5  ALBUMIN 3.6  AST 29  ALT 19  ALKPHOS 66  BILITOT 0.3    Recent Labs  10/24/13 0500  CHOL 164   No results found for this basename: PROTIME,  in the last 72  hours     Studies/Results: Dg Chest 2 View  10/23/2013   CLINICAL DATA:  Chest pain shortness of breath.  EXAM: CHEST  2 VIEW  COMPARISON:  01/26/2011  FINDINGS: Changes in median sternotomy for CABG. Heart size is mildly enlarged and appears stable. Thoracic aorta contours tortuous and stable. Pulmonary vascularity is normal. The lungs are well expanded and clear negative for pleural effusion or airspace disease. Negative for pneumothorax. No acute osseous abnormality identified. Imaged upper abdomen is unremarkable.  IMPRESSION: Mild cardiomegaly.  No acute cardiopulmonary disease.   Electronically Signed   By: Curlene Dolphin M.D.   On: 10/23/2013 19:44    Medications: I have reviewed the patient's current medications. Scheduled Meds: . amLODipine  10 mg Oral Daily  . aspirin EC  81 mg Oral q morning - 10a  . cholecalciferol  400 Units Oral QPM  . doxazosin  2 mg Oral QHS  . losartan  100 mg Oral Daily   And  . hydrochlorothiazide  25 mg Oral Daily  . insulin aspart  0-15 Units Subcutaneous TID WC  . labetalol  100 mg Oral BID  . magnesium oxide  400 mg Oral QPM  . regadenoson      . sodium chloride  3 mL Intravenous Q12H   Continuous Infusions: . heparin  1,650 Units/hr (10/24/13 [id number])   PRN Meds:.sodium chloride, nitroGLYCERIN, sodium chloride  Assessment/Plan: Principal Problem:   Chest pain- negative MI, no further pain. Active Problems:   DM2 (diabetes mellitus, type 2)   HTN (hypertension)   CAD in native artery   S/P CABG x 2   Hyperlipidemia LDL goal <70  For lexiscan myoview today.  LOS: 1 day   Time spent with pt. :15 minutes. [name]  Nurse Practitioner Certified Pager [phone] or after 5pm and on weekends call [phone] 10/24/2013, 10:51 AM

## 2013-10-24 NOTE — Discharge Instructions (Signed)
Chest Pain (Nonspecific) °It is often hard to give a specific diagnosis for the cause of chest pain. There is always a chance that your pain could be related to something serious, such as a heart attack or a blood clot in the lungs. You need to follow up with your caregiver for further evaluation. °CAUSES  °· Heartburn. °· Pneumonia or bronchitis. °· Anxiety or stress. °· Inflammation around your heart (pericarditis) or lung (pleuritis or pleurisy). °· A blood clot in the lung. °· A collapsed lung (pneumothorax). It can develop suddenly on its own (spontaneous pneumothorax) or from injury (trauma) to the chest. °· Shingles infection (herpes zoster virus). °The chest wall is composed of bones, muscles, and cartilage. Any of these can be the source of the pain. °· The bones can be bruised by injury. °· The muscles or cartilage can be strained by coughing or overwork. °· The cartilage can be affected by inflammation and become sore (costochondritis). °DIAGNOSIS  °Lab tests or other studies, such as X-rays, electrocardiography, stress testing, or cardiac imaging, may be needed to find the cause of your pain.  °TREATMENT  °· Treatment depends on what may be causing your chest pain. Treatment may include: °· Acid blockers for heartburn. °· Anti-inflammatory medicine. °· Pain medicine for inflammatory conditions. °· Antibiotics if an infection is present. °· You may be advised to change lifestyle habits. This includes stopping smoking and avoiding alcohol, caffeine, and chocolate. °· You may be advised to keep your head raised (elevated) when sleeping. This reduces the chance of acid going backward from your stomach into your esophagus. °· Most of the time, nonspecific chest pain will improve within 2 to 3 days with rest and mild pain medicine. °HOME CARE INSTRUCTIONS  °· If antibiotics were prescribed, take your antibiotics as directed. Finish them even if you start to feel better. °· For the next few days, avoid physical  activities that bring on chest pain. Continue physical activities as directed. °· Do not smoke. °· Avoid drinking alcohol. °· Only take over-the-counter or prescription medicine for pain, discomfort, or fever as directed by your caregiver. °· Follow your caregiver's suggestions for further testing if your chest pain does not go away. °· Keep any follow-up appointments you made. If you do not go to an appointment, you could develop lasting (chronic) problems with pain. If there is any problem keeping an appointment, you must call to reschedule. °SEEK MEDICAL CARE IF:  °· You think you are having problems from the medicine you are taking. Read your medicine instructions carefully. °· Your chest pain does not go away, even after treatment. °· You develop a rash with blisters on your chest. °SEEK IMMEDIATE MEDICAL CARE IF:  °· You have increased chest pain or pain that spreads to your arm, neck, jaw, back, or abdomen. °· You develop shortness of breath, an increasing cough, or you are coughing up blood. °· You have severe back or abdominal pain, feel nauseous, or vomit. °· You develop severe weakness, fainting, or chills. °· You have a fever. °THIS IS AN EMERGENCY. Do not wait to see if the pain will go away. Get medical help at once. Call your local emergency services (911 in [place] Do not drive yourself to the hospital. °MAKE SURE YOU:  °· Understand these instructions. °· Will watch your condition. °· Will get help right away if you are not doing well or get worse. °Document Released: 02/11/2005 Document Revised: 07/27/2011 Document Reviewed: 12/08/2007 °ExitCare® Patient Information ©2014 [employer]   LL[employer]. ° °

## 2013-10-24 NOTE — Discharge Summary (Signed)
Discharge Summary   Patient ID: [name],  MRN: [id number], DOB/AGE: 1931-05-04 80 y.o.  Admit date: 10/23/2013 Discharge date: 10/24/2013  Primary Care Provider: [facility] Primary Cardiologist: Dr. Ellyn Hack  Discharge Diagnoses Principal Problem:   Chest pain Active Problems:   DM2 (diabetes mellitus, type 2)   HTN (hypertension)   CAD in native artery   S/P CABG x 2   Hyperlipidemia LDL goal <70   Allergies Allergies  Allergen Reactions  . Other Other (See Comments)    No  BLOOD PRODUCTS - Pt is Jeohovah's Witness  . Pravastatin     Myalgias    Procedures  Lexiscan Myovue  TECHNIQUE: The patient received IV Lexiscan .4mg  over 15 seconds. 33.0 mCi of Technetium 36m Sestamibi injected at 30 seconds. Quantitative SPECT images were obtained in the vertical, horizontal and short axis planes after a 45 minute delay. Rest images were obtained with similar planes and delay using 10.2 mCi of Technetium 32m Sestamibi.  FINDINGS: ECG: SR LVH nonspecific ST changes No change with infusion  Symptoms: Nausea and Diaphoresis  RAW Data: Motion noted  QPS: 1  Quantitative Gated SPECT EF: 52% visually septal and apical hypokinesis  Perfusion Images: Normal resting and stress images  IMPRESSION: Low risk study. No perfusion abnormality. EF 53% but visually there was septal and apical hypokinesis    Hospital Course  [name] is an 80 year old Caucasian male with past medical history significant for coronary artery disease status post two-vessel bypass surgery in 2012, hyperlipidemia, obstructive sleep apnea and resistant hypertension who presented to [facility] complaining of chest pain. EKG showed no significant changes. Initial troponin was negative. His symptom happens both with and without exertion. Given his significant coronary disease history, patient was admitted overnight for observation.  Serial troponins overnight wee negative. Lipid panel was  obtained on the following day which showed cholesterol 164, LDL 93 and HDL 48. Hemoglobin A1c was elevated at 8.3.  TSH was normal. Patient underwent Lexiscan stress test in the morning of 10/24/2013 which revealed EF 53%, septal and apical hypokinesis, however no perfusion abnormality. Overall the test was low risk study. According to patient, he has been chest pain-free since the day of admission. He denies any significant shortness of breath associated with exertion. He has been ambulating but no significant chest pain. He stated he is feeling well at this time. He is deemed stable for discharge after discussing the stress test result with Dr. Debara Pickett. Patient has been advised to followup with his primary care physician to better manage diabetes. A voicemail was left with [place] scheduler who will contact the patient to schedule followup with Dr. Ellyn Hack or his NP/PA in 2-3 weeks. Patient has been advised to call the clinic if he does not hear from [place] in 2-3 days. Given his significant cardiac history, patient has been advised to seek medical attention if he has significant chest discomfort lasting longer than 20 minutes, worsening shortness breath or dizziness.  Discharge Vitals Blood pressure 152/78, pulse 60, temperature 99 F (37.2 C), temperature source Oral, resp. rate 15, height 6' (1.829 m), weight 224 lb 3.3 oz (101.7 kg), SpO2 96.00%.  Filed Weights   10/23/13 2330 10/24/13 0500  Weight: 224 lb 3.3 oz (101.7 kg) 224 lb 3.3 oz (101.7 kg)    Labs  CBC  Recent Labs  10/23/13 1755 10/24/13 0500  WBC 5.3 6.2  NEUTROABS 2.4  --   HGB 12.9* 12.9*  HCT 38.1* 38.6*  MCV 87.2 86.9  PLT 260 123456   Basic Metabolic Panel  Recent Labs  10/23/13 1755 10/24/13 0028 10/24/13 0500  NA 140  --  138  K 4.3  --  4.0  CL 100  --  97  CO2 28  --  26  GLUCOSE 192*  --  129*  BUN 25*  --  20  CREATININE 1.03  --  0.93  CALCIUM 9.5  --  9.1  MG  --  2.0  --    Liver  Function Tests  Recent Labs  10/23/13 1755 10/24/13 0500  AST 24 29  ALT 21 19  ALKPHOS 72 66  BILITOT 0.4 0.3  PROT 7.1 6.5  ALBUMIN 3.8 3.6   Cardiac Enzymes  Recent Labs  10/24/13 0028 10/24/13 0537 10/24/13 1440  TROPONINI <0.30 <0.30 <0.30   Hemoglobin A1C  Recent Labs  10/24/13 0028  HGBA1C 8.3*   Fasting Lipid Panel  Recent Labs  10/24/13 0500  CHOL 164  HDL 48  LDLCALC 93  TRIG 116  CHOLHDL 3.4   Thyroid Function Tests  Recent Labs  10/24/13 0028  TSH 1.180    Disposition  Pt is being discharged home today in good condition.  Follow-up Plans & Appointments      Follow-up Information   Follow up with HARDING,Laster W, MD In 2 weeks. ([facility] will call patient to schedule follow up with Dr. Ellyn Hack or his PA/NP in 2-3 weeks. If you do not hear from [place] in 2-3 days, please call clinic to schedule follow up)    Specialty:  Cardiology   Contact information:   [address] [place] [address] [phone]       Discharge Medications    Medication List         amLODipine 10 MG tablet  Commonly known as:  NORVASC  Take 10 mg by mouth daily.     aspirin EC 81 MG tablet  Take 81 mg by mouth every morning.     cholecalciferol 400 UNITS Tabs tablet  Commonly known as:  VITAMIN D  Take 400 Units by mouth every evening.     Cyanocobalamin 5000 MCG Subl  Take 5,000 mcg by mouth every morning.     doxazosin 2 MG tablet  Commonly known as:  CARDURA  Take 1 tablet (2 mg total) by mouth at bedtime.     glimepiride 4 MG tablet  Commonly known as:  AMARYL  Take 4 mg by mouth 2 (two) times daily with a meal.     labetalol 200 MG tablet  Commonly known as:  NORMODYNE  Take 0.5 tablets (100 mg total) by mouth 2 (two) times daily.     losartan-hydrochlorothiazide 100-25 MG per tablet  Commonly known as:  HYZAAR  Take 1 tablet by mouth every morning.     magnesium oxide 400 MG tablet  Commonly known as:  MAG-OX    Take 400 mg by mouth every evening.     metformin 1000 MG (OSM) 24 hr tablet  Commonly known as:  FORTAMET  Take 1,000 mg by mouth 2 (two) times daily with a meal.     Potassium Gluconate 595 MG Caps  Take 595 mg by mouth every morning.     SUPER C-500 Tabs  Take 1 tablet by mouth every evening.         Duration of Discharge Encounter   Greater than 30 minutes including physician time.  Signed, Almyra Deforest PA  10/24/2013, 6:06 PM Pager: [phone]

## 2013-10-25 ENCOUNTER — Telehealth: Payer: Self-pay | Admitting: Cardiology

## 2013-10-25 NOTE — Telephone Encounter (Signed)
Closed encounter °

## 2013-11-06 ENCOUNTER — Other Ambulatory Visit: Payer: Self-pay | Admitting: Dermatology

## 2013-11-06 DIAGNOSIS — Z85828 Personal history of other malignant neoplasm of skin: Secondary | ICD-10-CM | POA: Diagnosis not present

## 2013-11-06 DIAGNOSIS — C44621 Squamous cell carcinoma of skin of unspecified upper limb, including shoulder: Secondary | ICD-10-CM | POA: Diagnosis not present

## 2013-11-06 DIAGNOSIS — L570 Actinic keratosis: Secondary | ICD-10-CM | POA: Diagnosis not present

## 2013-11-06 DIAGNOSIS — D485 Neoplasm of uncertain behavior of skin: Secondary | ICD-10-CM | POA: Diagnosis not present

## 2013-11-06 DIAGNOSIS — L723 Sebaceous cyst: Secondary | ICD-10-CM | POA: Diagnosis not present

## 2013-11-09 ENCOUNTER — Ambulatory Visit (INDEPENDENT_AMBULATORY_CARE_PROVIDER_SITE_OTHER): Payer: Medicare Other | Admitting: Cardiology

## 2013-11-09 ENCOUNTER — Encounter: Payer: Self-pay | Admitting: Cardiology

## 2013-11-09 VITALS — BP 130/84 | HR 80 | Ht 73.5 in | Wt 232.3 lb

## 2013-11-09 DIAGNOSIS — I2510 Atherosclerotic heart disease of native coronary artery without angina pectoris: Secondary | ICD-10-CM | POA: Diagnosis not present

## 2013-11-09 NOTE — Progress Notes (Signed)
Patient ID: [name], male   DOB: September 06, 1930, 80 y.o.   MRN: [id number]    11/09/2013 [name]   01-16-1931  [id number]  Primary Physicia HAMRICK,MAURA L, MD Primary Cardiologist: Dr. Ellyn Hack  HPI:  The patient is a 80 year old, moderately obese, male followed by Dr. Ellyn Hack with a past medical history significant for coronary artery disease, status post two-vessel bypass surgery in 2012, hyperlipidemia, hypertension, type 2 diabetes mellitus and obstructive sleep apnea. He was recently admitted to [facility] for evaluation of chest pain. He ruled out for myocardial infarction with negative cardiac enzymes x3. He underwent a nuclear stress test that was low risk for ischemia, with no perfusion abnormality. Left ventricular ejection fraction was estimated at 53%. Also notable during his recent admission was an elevated hemoglobin A1c at 8.3. He also had a lipid panel which revealed an LDL of 93 mg/dL. He was discharged from the hospital on 10/24/2013. He presents to clinic today for posthospital followup.  Since discharge, he states that he has been doing well. He seems to think that a lot of his symptoms were caused by increased stress. He states that he has had to deal with more responsibility at home. He states that his wife's health has declined recently over the last several months and his 67 year old mother moved in with them, after her husband died recently. Subsequently, he has served as the primary caregiver for both his wife and his mother-in-law. Since being discharged from the hospital, he states that he is doing less work around the house and has relied on help from others. Subsequently, his stress levels have decreased and he denies any further chest discomfort. He also denies shortness of breath. He states that he has been fully compliant with his medications.   Current Outpatient Prescriptions  Medication Sig Dispense Refill  . amLODipine (NORVASC) 10 MG tablet Take  10 mg by mouth daily.       Marland Kitchen aspirin EC 81 MG tablet Take 81 mg by mouth every morning.      Marland Kitchen Bioflavonoid Products (SUPER C-500) TABS Take 1 tablet by mouth every evening.       . cholecalciferol (VITAMIN D) 400 UNITS TABS tablet Take 400 Units by mouth every evening.       . Cyanocobalamin 5000 MCG SUBL Take 5,000 mcg by mouth every morning.      Marland Kitchen doxazosin (CARDURA) 2 MG tablet Take 1 tablet (2 mg total) by mouth at bedtime.  30 tablet  5  . glimepiride (AMARYL) 4 MG tablet Take 4 mg by mouth 2 (two) times daily with a meal.       . labetalol (NORMODYNE) 200 MG tablet Take 0.5 tablets (100 mg total) by mouth 2 (two) times daily.      Marland Kitchen losartan-hydrochlorothiazide (HYZAAR) 100-25 MG per tablet Take 1 tablet by mouth every morning.       . magnesium oxide (MAG-OX) 400 MG tablet Take 400 mg by mouth every evening.       . metformin (FORTAMET) 1000 MG (OSM) 24 hr tablet Take 1,000 mg by mouth 2 (two) times daily with a meal.        . Potassium Gluconate 595 MG CAPS Take 595 mg by mouth every morning.        No current facility-administered medications for this visit.    Allergies  Allergen Reactions  . Other Other (See Comments)    No  BLOOD PRODUCTS - Pt is Jeohovah's Witness  .  Pravastatin     Myalgias    History   Social History  . Marital Status: Married    Spouse Name: [name]    Number of Children: 1  . Years of Education: N/A   Occupational History  .     Social History Main Topics  . Smoking status: Former Smoker    Types: Cigarettes    Quit date: 05/27/1957  . Smokeless tobacco: Never Used  . Alcohol Use: No  . Drug Use: No  . Sexual Activity: Not on file   Other Topics Concern  . Not on file   Social History Narrative   Married father of one.  Previously uses stationary bike routinely.  Now reduced due to other social stressors.   Quit smoking 50 years ago.  Does not drink alcohol     Review of Systems: General: negative for chills, fever, night sweats  or weight changes.  Cardiovascular: negative for chest pain, dyspnea on exertion, edema, orthopnea, palpitations, paroxysmal nocturnal dyspnea or shortness of breath Dermatological: negative for rash Respiratory: negative for cough or wheezing Urologic: negative for hematuria Abdominal: negative for nausea, vomiting, diarrhea, bright red blood per rectum, melena, or hematemesis Neurologic: negative for visual changes, syncope, or dizziness All other systems reviewed and are otherwise negative except as noted above.    Blood pressure 130/84, pulse 80, height 6' 1.5" (1.867 m), weight 232 lb 4.8 oz (105.371 kg).  General appearance: alert, cooperative and no distress Neck: no carotid bruit and no JVD Lungs: clear to auscultation bilaterally Heart: regular rate and rhythm, S1, S2 normal, no murmur, click, rub or gallop Extremities: No LEE Pulses: 2+ and symmetric Skin: warm and dry Neurologic: Grossly normal  EKG not performed  ASSESSMENT AND PLAN:   1. CAD: Status post two-vessel CABG in 2012. Recent nuclear stress test 10/24/2013 demonstrated normal perfusion and was low risk. Left ventricular ejection fraction was estimated at 53%. He denies recurrent chest pain. Continue current medical regimen and risk factor modification.  2. Hypertension: Well controlled in clinic today at 130/80. Continue current medical regimen.  3. Diabetes: His recent hemoglobin A1c was 8.3. This needs to be better controlled. His goal in the setting of CAD should be less than 7.0. He was seen by his PCP 2 days ago and states that his medications were adjusted. His PCP will continue to manage his diabetes. We discussed the importance of proper diet and increasing physical activity.  4. Hyperlipidemia: LDL was slightly elevated at time of most recent lipid study at 93 mg/dL. His goal in the setting of concomitant CAD is less than 70 mg/dL. He states that he is intolerant to statins (myalgias). He wishes to  manage his cholesterol through diet and exercise. He states that he plans to increase his physical activity and will adopt better eating habits.  5. Obesity: Discussed the importance of weight loss for overall cardiovascular health, reducing cholesterol levels and improving hemoglobin A1c. He seems motivated to lose weight and states that he plans on increasing his physical activity.  PLAN  He appears stable from a cardiac standpoint. He has been instructed to continue his current medications as prescribed. He was instructed to followup with Dr. Ellyn Hack in 3 months.  SIMMONS, BRITTAINYPA-C 11/09/2013 2:15 PM

## 2013-11-09 NOTE — Patient Instructions (Signed)
No changes have been made today in your current medications and therapy.  Please keep your follow-up appointment with Dr Roni Bread.

## 2014-01-12 DIAGNOSIS — R42 Dizziness and giddiness: Secondary | ICD-10-CM | POA: Diagnosis not present

## 2014-01-17 ENCOUNTER — Other Ambulatory Visit: Payer: Self-pay | Admitting: Family Medicine

## 2014-01-17 DIAGNOSIS — R42 Dizziness and giddiness: Secondary | ICD-10-CM

## 2014-01-26 ENCOUNTER — Ambulatory Visit
Admission: RE | Admit: 2014-01-26 | Discharge: 2014-01-26 | Disposition: A | Discharge status: Home / Self Care | Payer: Medicare Other | Source: Ambulatory Visit | Attending: Family Medicine | Admitting: Family Medicine

## 2014-01-26 ENCOUNTER — Other Ambulatory Visit: Payer: Self-pay | Admitting: Family Medicine

## 2014-01-26 DIAGNOSIS — R42 Dizziness and giddiness: Secondary | ICD-10-CM

## 2014-01-26 DIAGNOSIS — Z135 Encounter for screening for eye and ear disorders: Secondary | ICD-10-CM | POA: Diagnosis not present

## 2014-01-26 DIAGNOSIS — T1590XA Foreign body on external eye, part unspecified, unspecified eye, initial encounter: Secondary | ICD-10-CM

## 2014-01-26 DIAGNOSIS — I6529 Occlusion and stenosis of unspecified carotid artery: Secondary | ICD-10-CM | POA: Diagnosis not present

## 2014-01-26 MED ORDER — GADOBENATE DIMEGLUMINE 529 MG/ML IV SOLN
20.0000 mL | Freq: Once | INTRAVENOUS | Status: AC | PRN
  Administered 2014-01-26: 20 mL via INTRAVENOUS

## 2014-02-06 ENCOUNTER — Telehealth: Payer: Self-pay | Admitting: Cardiovascular Disease

## 2014-02-06 NOTE — Telephone Encounter (Signed)
Closed encounter °

## 2014-02-14 ENCOUNTER — Telehealth: Payer: Self-pay | Admitting: Cardiovascular Disease

## 2014-02-14 NOTE — Telephone Encounter (Signed)
Closed encounter °

## 2014-02-21 ENCOUNTER — Encounter: Payer: Self-pay | Admitting: Cardiovascular Disease

## 2014-02-21 ENCOUNTER — Ambulatory Visit (INDEPENDENT_AMBULATORY_CARE_PROVIDER_SITE_OTHER): Payer: Medicare Other | Admitting: Cardiovascular Disease

## 2014-02-21 VITALS — BP 162/90 | HR 68 | Ht 73.0 in | Wt 235.8 lb

## 2014-02-21 DIAGNOSIS — R42 Dizziness and giddiness: Secondary | ICD-10-CM | POA: Diagnosis not present

## 2014-02-21 DIAGNOSIS — R0989 Other specified symptoms and signs involving the circulatory and respiratory systems: Secondary | ICD-10-CM | POA: Diagnosis not present

## 2014-02-21 DIAGNOSIS — I2510 Atherosclerotic heart disease of native coronary artery without angina pectoris: Secondary | ICD-10-CM

## 2014-02-21 NOTE — Patient Instructions (Signed)
Your physician has requested that you have a carotid duplex. This test is an ultrasound of the carotid arteries in your neck. It looks at blood flow through these arteries that supply the brain with blood. Allow one hour for this exam. There are no restrictions or special instructions.  Your physician recommends that you schedule a follow-up appointment as needed with Dr. Gwenlyn Found   Please schedule an office visit with Dr. Ellyn Hack (primary cardiologist) - you are due for your next visit with him this month.

## 2014-02-21 NOTE — Assessment & Plan Note (Signed)
The patient is an 80 year old moderately overweight married Caucasian male patient of Dr. Cristela Blue Hamrick's and Shanon Brow Harding's. He has a history of CAD status post coronary artery bypass grafting in 2012.he had a prior history of stroke to 4 years ago with residual deficits in his right hand. Several months ago he had a weak long episode of dizziness. His primary care physician referred him here for evaluation of carotid artery disease. On exam he is a soft right carotid bruit. We will get carotid Doppler studies.

## 2014-02-21 NOTE — Progress Notes (Signed)
02/21/2014 [name]   [birth year]  [id number]  Primary Physician Leonides Sake, MD Primary Cardiologist: Lorretta Harp MD Renae Gloss   HPI:  [name] is a delightful 80 year old married Caucasian male father of one child who is a patient of Dr. Shanon Brow Hardings. He was at his primary care physician, Dr. Laurance Flatten and hemorrhagic, for dilation of suspected carotid artery disease. He is a history of CAD status post bypass grafting in 2012 his other problems include hypertension, hyperlipidemia. He was admitted to [facility] several months ago with chest pain rule out myocardial infarction and a thallium Myoview stress test was low risk.   Current Outpatient Prescriptions  Medication Sig Dispense Refill  . amLODipine (NORVASC) 10 MG tablet Take 10 mg by mouth daily.       Marland Kitchen aspirin EC 81 MG tablet Take 81 mg by mouth every morning.      Marland Kitchen Bioflavonoid Products (SUPER C-500) TABS Take 1 tablet by mouth every evening.       . cholecalciferol (VITAMIN D) 400 UNITS TABS tablet Take 400 Units by mouth every evening.       . Cyanocobalamin 5000 MCG SUBL Take 5,000 mcg by mouth every morning.      Marland Kitchen doxazosin (CARDURA) 2 MG tablet Take 1 tablet (2 mg total) by mouth at bedtime.  30 tablet  5  . glimepiride (AMARYL) 4 MG tablet Take 4 mg by mouth 2 (two) times daily with a meal.       . labetalol (NORMODYNE) 200 MG tablet Take 0.5 tablets (100 mg total) by mouth 2 (two) times daily.      Marland Kitchen losartan-hydrochlorothiazide (HYZAAR) 100-25 MG per tablet Take 1 tablet by mouth every morning.       . magnesium oxide (MAG-OX) 400 MG tablet Take 400 mg by mouth every evening.       . metformin (FORTAMET) 1000 MG (OSM) 24 hr tablet Take 1,000 mg by mouth 2 (two) times daily with a meal.        . Potassium Gluconate 595 MG CAPS Take 595 mg by mouth every morning.        No current facility-administered medications for this visit.    Allergies  Allergen Reactions  . Other  Other (See Comments)    No  BLOOD PRODUCTS - Pt is Jeohovah's Witness  . Pravastatin     Myalgias    History   Social History  . Marital Status: Married    Spouse Name: [name]    Number of Children: 1  . Years of Education: N/A   Occupational History  .     Social History Main Topics  . Smoking status: Former Smoker    Types: Cigarettes    Quit date: 05/27/1957  . Smokeless tobacco: Never Used  . Alcohol Use: No  . Drug Use: No  . Sexual Activity: Not on file   Other Topics Concern  . Not on file   Social History Narrative   Married father of one.  Previously uses stationary bike routinely.  Now reduced due to other social stressors.   Quit smoking 50 years ago.  Does not drink alcohol     Review of Systems: General: negative for chills, fever, night sweats or weight changes.  Cardiovascular: negative for chest pain, dyspnea on exertion, edema, orthopnea, palpitations, paroxysmal nocturnal dyspnea or shortness of breath Dermatological: negative for rash Respiratory: negative for cough or wheezing Urologic: negative for hematuria Abdominal: negative  for nausea, vomiting, diarrhea, bright red blood per rectum, melena, or hematemesis Neurologic: negative for visual changes, syncope, or dizziness All other systems reviewed and are otherwise negative except as noted above.    Blood pressure 162/90, pulse 68, height 6\' 1"  (1.854 m), weight 235 lb 12.8 oz (106.958 kg).  General appearance: alert and no distress Neck: no adenopathy, no JVD, supple, symmetrical, trachea midline, thyroid not enlarged, symmetric, no tenderness/mass/nodules and soft right carotid bruit Lungs: clear to auscultation bilaterally Heart: regular rate and rhythm, S1, S2 normal, no murmur, click, rub or gallop Extremities: 1+ pitting edema bilaterally, diminished pedal pulses  EKG not performed today  ASSESSMENT AND PLAN:   Dizziness The patient is an 80 year old moderately overweight married  Caucasian male patient of Dr. Cristela Blue Hamrick's and Shanon Brow Harding's. He has a history of CAD status post coronary artery bypass grafting in 2012.he had a prior history of stroke to 4 years ago with residual deficits in his right hand. Several months ago he had a weak long episode of dizziness. His primary care physician referred him here for evaluation of carotid artery disease. On exam he is a soft right carotid bruit. We will get carotid Doppler studies.      Lorretta Harp MD FACP,FACC,FAHA, [facility] 02/21/2014 11:31 AM

## 2014-02-22 ENCOUNTER — Telehealth: Payer: Self-pay | Admitting: Cardiology

## 2014-02-22 DIAGNOSIS — I10 Essential (primary) hypertension: Secondary | ICD-10-CM | POA: Diagnosis not present

## 2014-02-22 DIAGNOSIS — E780 Pure hypercholesterolemia: Secondary | ICD-10-CM | POA: Diagnosis not present

## 2014-02-22 DIAGNOSIS — N182 Chronic kidney disease, stage 2 (mild): Secondary | ICD-10-CM | POA: Diagnosis not present

## 2014-02-22 DIAGNOSIS — E559 Vitamin D deficiency, unspecified: Secondary | ICD-10-CM | POA: Diagnosis not present

## 2014-02-22 DIAGNOSIS — E1129 Type 2 diabetes mellitus with other diabetic kidney complication: Secondary | ICD-10-CM | POA: Diagnosis not present

## 2014-02-22 NOTE — Telephone Encounter (Signed)
Called patient to schedule carotid and appt with dr. Ellyn Hack on same day in November.  Unable to leave a message.

## 2014-02-23 ENCOUNTER — Encounter (HOSPITAL_COMMUNITY): Payer: Medicare Other

## 2014-02-23 ENCOUNTER — Other Ambulatory Visit: Payer: Self-pay | Admitting: Cardiology

## 2014-02-23 NOTE — Telephone Encounter (Signed)
Rx was sent to pharmacy electronically. 

## 2014-03-27 ENCOUNTER — Telehealth: Payer: Self-pay | Admitting: Cardiology

## 2014-03-28 NOTE — Telephone Encounter (Signed)
Closed encounter °

## 2014-03-29 ENCOUNTER — Encounter: Payer: Self-pay | Admitting: Cardiology

## 2014-03-29 ENCOUNTER — Ambulatory Visit (INDEPENDENT_AMBULATORY_CARE_PROVIDER_SITE_OTHER): Payer: Medicare Other | Admitting: Cardiology

## 2014-03-29 ENCOUNTER — Ambulatory Visit: Payer: Medicare Other | Admitting: Cardiology

## 2014-03-29 ENCOUNTER — Ambulatory Visit (HOSPITAL_COMMUNITY)
Admission: RE | Admit: 2014-03-29 | Discharge: 2014-03-29 | Disposition: A | Discharge status: Home / Self Care | Payer: Medicare Other | Source: Ambulatory Visit | Attending: Cardiology | Admitting: Cardiology

## 2014-03-29 VITALS — BP 156/80 | HR 48 | Ht 73.5 in | Wt 236.8 lb

## 2014-03-29 DIAGNOSIS — E669 Obesity, unspecified: Secondary | ICD-10-CM

## 2014-03-29 DIAGNOSIS — R42 Dizziness and giddiness: Secondary | ICD-10-CM | POA: Diagnosis not present

## 2014-03-29 DIAGNOSIS — R0989 Other specified symptoms and signs involving the circulatory and respiratory systems: Secondary | ICD-10-CM | POA: Diagnosis not present

## 2014-03-29 DIAGNOSIS — I214 Non-ST elevation (NSTEMI) myocardial infarction: Secondary | ICD-10-CM | POA: Diagnosis not present

## 2014-03-29 DIAGNOSIS — R600 Localized edema: Secondary | ICD-10-CM | POA: Diagnosis not present

## 2014-03-29 DIAGNOSIS — E1169 Type 2 diabetes mellitus with other specified complication: Secondary | ICD-10-CM

## 2014-03-29 DIAGNOSIS — E785 Hyperlipidemia, unspecified: Secondary | ICD-10-CM

## 2014-03-29 DIAGNOSIS — I2510 Atherosclerotic heart disease of native coronary artery without angina pectoris: Secondary | ICD-10-CM | POA: Diagnosis not present

## 2014-03-29 DIAGNOSIS — Z79899 Other long term (current) drug therapy: Secondary | ICD-10-CM

## 2014-03-29 DIAGNOSIS — I10 Essential (primary) hypertension: Secondary | ICD-10-CM | POA: Diagnosis not present

## 2014-03-29 DIAGNOSIS — E119 Type 2 diabetes mellitus without complications: Secondary | ICD-10-CM

## 2014-03-29 MED ORDER — ROSUVASTATIN CALCIUM 5 MG PO TABS: ORAL_TABLET | ORAL | Status: DC

## 2014-03-29 MED ORDER — FUROSEMIDE 20 MG PO TABS: 20.0000 mg | ORAL_TABLET | Freq: Every day | ORAL | Status: DC

## 2014-03-29 NOTE — Patient Instructions (Signed)
Take 20 mg Lasix (furosemide) one tablet daily for 7 days afterwards then take at least twice a week.  If you stop eating salted peanuts and extra water you may not need the Lasix weekly.  START IN 2 WEEKS CRESTOR 5 MG  ON MONDAYS AND FRIDAYS - DAILY  LIPIDS ,CMP--IN 6 MONTH- WILL MAIL YOU LAB SLIP.  Your physician wants you to follow-up in [place].  You will receive a reminder letter in the mail two months in advance. If you don't receive a letter, please call our office to schedule the follow-up appointment.

## 2014-03-29 NOTE — Progress Notes (Signed)
Carotid Duplex Completed. [name], BS, RDMS, RVT  

## 2014-03-30 ENCOUNTER — Encounter: Payer: Self-pay | Admitting: Cardiology

## 2014-03-30 DIAGNOSIS — R600 Localized edema: Secondary | ICD-10-CM | POA: Insufficient documentation

## 2014-03-30 HISTORY — DX: Localized edema: R60.0

## 2014-03-30 NOTE — Assessment & Plan Note (Signed)
This symptom seems to have resolved.  He does have carotid artery Dopplers that were just done. We will followup on the results. I am reluctant to be more aggressive with blood pressure control to avoid any potential orthostatic hypotension.

## 2014-03-30 NOTE — Assessment & Plan Note (Signed)
He has had some elevated blood pressures. I would like for him to monitor his pressures at home. My suspicion is that part of the edema may be simply related to volume overload, and with diuresis his blood pressure should return to normal. He does run blood pressures in the roughly 140-160 mm mercury systolic range. I don't be too aggressive since he has had a history of TIAs with possible carotid disease.

## 2014-03-30 NOTE — Assessment & Plan Note (Signed)
He does not seem to be having any additional symptoms of heart failure with PND or orthopnea. My suspicion is that this is probably related to his eating salted peanuts. -- Plan:  Stop eating salted peanuts  I will start him on low-dose when necessary Lasix. He will start 20 mg Lasix that he will take daily for the first week, and then maybe 3-4 days a week after that or when necessary.    Recommended support stockings.

## 2014-03-30 NOTE — Assessment & Plan Note (Signed)
Doing quite well 3 years post CABG. He did have a Myoview that was recently done. Would not need another one unless symptoms warrant for 4-5 years. No recurrent anginal symptoms. I don't think that discomfort he had back in June was related to coronary disease. He is on a beta blocker and ARB as well as aspirin. Not currently on a statin due to her intolerance of simvastatin and atorvastatin as well as pravastatin.

## 2014-03-30 NOTE — Assessment & Plan Note (Signed)
On oral medications. Monitored by PCP. 

## 2014-03-30 NOTE — Progress Notes (Signed)
PCP: Leonides Sake, MD  Clinic Note: Chief Complaint  Patient presents with  . Follow-up    6 months:  Carotid doppler done today.  No chest pain or SOB.  Bilateral lower extremity edema over the past several weeks.  Dizziness about 6 weeks that resolved on its' own.   HPI: [name] is a 80 y.o. male with a PMH below who presents today for followup after seeing Dr. Quay Burow on October 7. He also saw Ellen Henri in June for a hospital followup. It [facility] to evaluate chest pain and was ruled out for MI. He had a Myoview stress test that was low risk. He then saw Dr. Gwenlyn Found for evaluation of a soft carotid bruit in the setting of dizziness and history of stroke.  Actually had his carotid Dopplers done today prior to this visit. They have not yet been read. They will be evaluated by Dr. Gwenlyn Found.  Past Medical History  Diagnosis Date  . Non-STEMI (non-ST elevated myocardial infarction) July 2012    1. Ostial LAD 70% (2 posterior left main for PCI), subtotal occlusion of the RCA  . CAD in native artery July 2012    1. Referred for CABG; 2. Echo EF 55-60%, severe conc LVH, normal systolic fxn, Gr1 DD, Aortic sclerosis w/o stenosis; 3. NEGATIVE Lexiscan Myoview 6/9/'15, EF 53%, no perfusion abnormality, septal and apical HK noted.  . 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 Servando Snare)  . Postoperative atrial fibrillation     Post CABG, no sign recurrence  . HTN (hypertension)   . Type II diabetes mellitus with complication     CAD, occasions  . Hyperlipidemia LDL goal <70 July 2012    Monitor by PCP  . 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 Refusal of blood transfusions as patient is Jehovah's Witness   . Sleep apnea   . Abdominal aneurysm     mild aneursymal dilatation - 04/23/2011  . Dizziness    Interval History: he presents today discounting any significant dizziness or syncope/near syncope. He  said he had some unsteady gait and dizziness about a month ago, but that has essentially resolved her is not really sure what triggered it, but he has not had any further symptoms. The one thing he does note is that he has been having a little more swelling in his feet her last  Month or so. I asked her that further in-depth questions and he admitted to snacking on salted roasted peanuts over this period of time. With that he is also drinking a lot of water. He doesn't have any PND orthopnea or significant exertional dyspnea.   He denies any anginal symptoms or chest pain or shortness of breath with rest or exertion. No palpitations, lightheadedness or syncope/near syncope. He really has not had anymore the dizziness in the last 3-4 weeks.  No recent TIA/amaurosis fugax symptoms.  ROS: A comprehensive was performed. Review of Systems  Constitutional: Negative for weight loss and malaise/fatigue.  HENT: Negative for congestion and nosebleeds.   Respiratory: Negative for cough, shortness of breath and wheezing.   Cardiovascular: Negative for claudication.  Gastrointestinal: Negative for heartburn, constipation, blood in stool and melena.  Genitourinary: Negative for hematuria.  Musculoskeletal: Positive for joint pain. Negative for myalgias and falls.  Neurological: Positive for dizziness. Negative for sensory change, speech change, focal weakness, seizures and loss of consciousness.  Endo/Heme/Allergies: Does not bruise/bleed easily.  Psychiatric/Behavioral: Negative for memory loss. The patient is not nervous/anxious and does not have insomnia.   All other systems reviewed and are negative.   Current Outpatient Prescriptions on File Prior to Visit  Medication Sig Dispense Refill  . amLODipine (NORVASC) 10 MG tablet Take 10 mg by mouth daily.     Marland Kitchen aspirin EC 81 MG tablet Take 81 mg by mouth every morning.    Marland Kitchen Bioflavonoid Products (SUPER C-500) TABS Take 1 tablet by mouth every evening.       . cholecalciferol (VITAMIN D) 400 UNITS TABS tablet Take 400 Units by mouth every evening.     . Cyanocobalamin 5000 MCG SUBL Take 5,000 mcg by mouth every morning.    Marland Kitchen doxazosin (CARDURA) 2 MG tablet Take 1 tablet (2 mg total) by mouth at bedtime. 30 tablet 5  . glimepiride (AMARYL) 4 MG tablet Take 4 mg by mouth 2 (two) times daily with a meal.     . labetalol (NORMODYNE) 200 MG tablet Take 0.5 tablets (100 mg total) by mouth 2 (two) times daily. 30 tablet 11  . losartan-hydrochlorothiazide (HYZAAR) 100-25 MG per tablet Take 1 tablet by mouth every morning.     . magnesium oxide (MAG-OX) 400 MG tablet Take 400 mg by mouth every evening.     . metformin (FORTAMET) 1000 MG (OSM) 24 hr tablet Take 1,000 mg by mouth 2 (two) times daily with a meal.      . Potassium Gluconate 595 MG CAPS Take 595 mg by mouth every morning.      No current facility-administered medications on file prior to visit.   ALLERGIES REVIEWED IN EPIC -- no change SOCIAL AND FAMILY HISTORY REVIEWED IN EPIC -- no change  Wt Readings from Last 3 Encounters:  03/29/14 236 lb 12.8 oz (107.412 kg)  02/21/14 235 lb 12.8 oz (106.958 kg)  11/09/13 232 lb 4.8 oz (105.371 kg)    PHYSICAL EXAM BP 156/80 mmHg  Pulse 48  Ht 6' 1.5" (1.867 m)  Wt 236 lb 12.8 oz (107.412 kg)  BMI 30.82 kg/m2 General appearance: alert, cooperative, appears stated age, no distress and mildly obese Neck: no adenopathy, no carotid bruit and no JVD Lungs: clear to auscultation bilaterally, normal percussion bilaterally and non-labored Heart: regular rate and rhythm, S1, S2 normal, soft 1/6 SEM at RUSB radiating to carotids, click, rub or gallop ; nondisplaced PMI Abdomen: soft, non-tender; bowel sounds normal; no masses, no organomegaly;  Extremities: extremities normal, atraumatic, no cyanosis, and ~2+ bilateral LE edema Pulses: 2+ and symmetric;  Neurologic: Mental status: Alert, oriented, thought content appropriate   Adult ECG Report   Rate: 48 ;  Rhythm: sinus bradycardia and 1 AVB  Narrative Interpretation: otherwise normal EKG  Recent Labs:    Lab Results  Component Value Date   CHOL 164 10/24/2013   HDL 48 10/24/2013   LDLCALC 93 10/24/2013   TRIG 116 10/24/2013   CHOLHDL 3.4 10/24/2013     Chemistry      Component Value Date/Time   NA 138 10/24/2013 0500   K 4.0 10/24/2013 0500   CL 97 10/24/2013 0500   CO2 26 10/24/2013 0500   BUN 20 10/24/2013 0500   CREATININE 0.93 10/24/2013 0500      Component Value Date/Time   CALCIUM 9.1 10/24/2013 0500   ALKPHOS 66 10/24/2013 0500   AST 29 10/24/2013 0500   ALT 19 10/24/2013 0500   BILITOT 0.3 10/24/2013 0500  ASSESSMENT / PLAN: History of: Non-STEMI (non-ST elevated myocardial infarction) Admitted for chest pain several months ago, evaluated with a negative Myoview stress test. No real active anginal symptoms.  Multivessel CAD -  near ostial LAD 70% and RCA subtotal occlusion - status post CABG x2; Doing quite well 3 years post CABG. He did have a Myoview that was recently done. Would not need another one unless symptoms warrant for 4-5 years. No recurrent anginal symptoms. I don't think that discomfort he had back in June was related to coronary disease. He is on a beta blocker and ARB as well as aspirin. Not currently on a statin due to her intolerance of simvastatin and atorvastatin as well as pravastatin.  Bilateral lower extremity edema He does not seem to be having any additional symptoms of heart failure with PND or orthopnea. My suspicion is that this is probably related to his eating salted peanuts. -- Plan:  Stop eating salted peanuts  I will start him on low-dose when necessary Lasix. He will start 20 mg Lasix that he will take daily for the first week, and then maybe 3-4 days a week after that or when necessary.    Recommended support stockings.  Hyperlipidemia with target LDL less than 70 He has a history of statin intolerance, as  best I can tell he is dry, statin, atorvastatin and simvastatin. I think even on the higher dose of Crestor in the past as well. He is not at goal for his LDL, I would like to try low-dose Crestor at 5 mg 2-3 days a week. -- I don't want him to start this for at least 2 weeks in order to allow time to not confuse that her cramping from Lasix.  We'll check a lipid panel and CMP prior to his 6 month followup.  Moderate essential hypertension He has had some elevated blood pressures. I would like for him to monitor his pressures at home. My suspicion is that part of the edema may be simply related to volume overload, and with diuresis his blood pressure should return to normal. He does run blood pressures in the roughly 140-160 mm mercury systolic range. I don't be too aggressive since he has had a history of TIAs with possible carotid disease.  Dizziness This symptom seems to have resolved.  He does have carotid artery Dopplers that were just done. We will followup on the results. I am reluctant to be more aggressive with blood pressure control to avoid any potential orthostatic hypotension.  Diabetes mellitus type 2 in obese On oral medications. Monitored by PCP.  Obesity (BMI 30-39.9) No real change in weight. He is trying to adjust his diet, but I think he may be a bit volume up after his dietary indiscretions with salted Peanuts.   He has multiple medical problems including the new pedal edema issue that required evaluation as well as not adequately controlled dyslipidemia as well as hypertension and coronary disease. He also had the dizziness. All these symptoms were discussed taking close to 45-50 minutes.  Orders Placed This Encounter  Procedures  . Lipid panel    Standing Status: Future     Number of Occurrences:      Standing Expiration Date: 03/30/2015    Order Specific Question:  Has the patient fasted?    Answer:  Yes  . Comprehensive metabolic panel    Standing Status:  Future     Number of Occurrences:      Standing Expiration Date: 03/30/2015  Order Specific Question:  Has the patient fasted?    Answer:  Yes  . EKG 12-Lead   Meds ordered this encounter  Medications  . furosemide (LASIX) 20 MG tablet    Sig: Take 1 tablet (20 mg total) by mouth daily.    Dispense:  90 tablet    Refill:  3  . rosuvastatin (CRESTOR) 5 MG tablet    Sig: Take 1 tablet on Mondays and Fridays only    Dispense:  24 tablet    Refill:  3    Followup: 6 months   Karlie Aung,Saladin W, M.D., M.S. Interventional Cardiologist   Pager # [phone]

## 2014-03-30 NOTE — Assessment & Plan Note (Signed)
Admitted for chest pain several months ago, evaluated with a negative Myoview stress test. No real active anginal symptoms.

## 2014-03-30 NOTE — Assessment & Plan Note (Signed)
No real change in weight. He is trying to adjust his diet, but I think he may be a bit volume up after his dietary indiscretions with salted Peanuts.

## 2014-03-30 NOTE — Assessment & Plan Note (Addendum)
He has a history of statin intolerance, as best I can tell he is dry, statin, atorvastatin and simvastatin. I think even on the higher dose of Crestor in the past as well. He is not at goal for his LDL, I would like to try low-dose Crestor at 5 mg 2-3 days a week. -- I don't want him to start this for at least 2 weeks in order to allow time to not confuse that her cramping from Lasix.  We'll check a lipid panel and CMP prior to his 6 month followup.

## 2014-04-02 ENCOUNTER — Encounter: Payer: Self-pay | Admitting: *Deleted

## 2014-05-07 DIAGNOSIS — L570 Actinic keratosis: Secondary | ICD-10-CM | POA: Diagnosis not present

## 2014-05-07 DIAGNOSIS — D1801 Hemangioma of skin and subcutaneous tissue: Secondary | ICD-10-CM | POA: Diagnosis not present

## 2014-05-07 DIAGNOSIS — Z85828 Personal history of other malignant neoplasm of skin: Secondary | ICD-10-CM | POA: Diagnosis not present

## 2014-05-07 DIAGNOSIS — L821 Other seborrheic keratosis: Secondary | ICD-10-CM | POA: Diagnosis not present

## 2014-06-17 ENCOUNTER — Emergency Department (HOSPITAL_COMMUNITY): Payer: Medicare Other

## 2014-06-17 ENCOUNTER — Encounter (HOSPITAL_COMMUNITY): Payer: Self-pay | Admitting: Emergency Medicine

## 2014-06-17 ENCOUNTER — Inpatient Hospital Stay (HOSPITAL_COMMUNITY)
Admission: EM | Admit: 2014-06-17 | Discharge: 2014-06-19 | DRG: 247 | Disposition: A | Discharge status: Home / Self Care | Payer: Medicare Other | Attending: Cardiology | Admitting: Cardiology

## 2014-06-17 DIAGNOSIS — I517 Cardiomegaly: Secondary | ICD-10-CM | POA: Diagnosis not present

## 2014-06-17 DIAGNOSIS — Z6829 Body mass index (BMI) 29.0-29.9, adult: Secondary | ICD-10-CM

## 2014-06-17 DIAGNOSIS — G4733 Obstructive sleep apnea (adult) (pediatric): Secondary | ICD-10-CM | POA: Diagnosis present

## 2014-06-17 DIAGNOSIS — I4891 Unspecified atrial fibrillation: Secondary | ICD-10-CM | POA: Diagnosis present

## 2014-06-17 DIAGNOSIS — I252 Old myocardial infarction: Secondary | ICD-10-CM

## 2014-06-17 DIAGNOSIS — I2510 Atherosclerotic heart disease of native coronary artery without angina pectoris: Secondary | ICD-10-CM | POA: Diagnosis present

## 2014-06-17 DIAGNOSIS — I2582 Chronic total occlusion of coronary artery: Secondary | ICD-10-CM | POA: Diagnosis present

## 2014-06-17 DIAGNOSIS — Z951 Presence of aortocoronary bypass graft: Secondary | ICD-10-CM | POA: Diagnosis not present

## 2014-06-17 DIAGNOSIS — I209 Angina pectoris, unspecified: Secondary | ICD-10-CM | POA: Insufficient documentation

## 2014-06-17 DIAGNOSIS — I214 Non-ST elevation (NSTEMI) myocardial infarction: Secondary | ICD-10-CM | POA: Diagnosis not present

## 2014-06-17 DIAGNOSIS — E1169 Type 2 diabetes mellitus with other specified complication: Secondary | ICD-10-CM

## 2014-06-17 DIAGNOSIS — Z87891 Personal history of nicotine dependence: Secondary | ICD-10-CM

## 2014-06-17 DIAGNOSIS — R0602 Shortness of breath: Secondary | ICD-10-CM | POA: Diagnosis not present

## 2014-06-17 DIAGNOSIS — Z8673 Personal history of transient ischemic attack (TIA), and cerebral infarction without residual deficits: Secondary | ICD-10-CM

## 2014-06-17 DIAGNOSIS — R079 Chest pain, unspecified: Secondary | ICD-10-CM | POA: Diagnosis not present

## 2014-06-17 DIAGNOSIS — E1165 Type 2 diabetes mellitus with hyperglycemia: Secondary | ICD-10-CM | POA: Diagnosis present

## 2014-06-17 DIAGNOSIS — I10 Essential (primary) hypertension: Secondary | ICD-10-CM | POA: Diagnosis present

## 2014-06-17 DIAGNOSIS — E785 Hyperlipidemia, unspecified: Secondary | ICD-10-CM

## 2014-06-17 DIAGNOSIS — I25810 Atherosclerosis of coronary artery bypass graft(s) without angina pectoris: Secondary | ICD-10-CM | POA: Diagnosis not present

## 2014-06-17 DIAGNOSIS — I213 ST elevation (STEMI) myocardial infarction of unspecified site: Secondary | ICD-10-CM | POA: Diagnosis present

## 2014-06-17 DIAGNOSIS — I25700 Atherosclerosis of coronary artery bypass graft(s), unspecified, with unstable angina pectoris: Secondary | ICD-10-CM | POA: Insufficient documentation

## 2014-06-17 DIAGNOSIS — Z7902 Long term (current) use of antithrombotics/antiplatelets: Secondary | ICD-10-CM

## 2014-06-17 DIAGNOSIS — E669 Obesity, unspecified: Secondary | ICD-10-CM | POA: Diagnosis present

## 2014-06-17 DIAGNOSIS — Z7982 Long term (current) use of aspirin: Secondary | ICD-10-CM

## 2014-06-17 DIAGNOSIS — I440 Atrioventricular block, first degree: Secondary | ICD-10-CM | POA: Diagnosis present

## 2014-06-17 DIAGNOSIS — G4730 Sleep apnea, unspecified: Secondary | ICD-10-CM | POA: Diagnosis present

## 2014-06-17 DIAGNOSIS — Z79899 Other long term (current) drug therapy: Secondary | ICD-10-CM

## 2014-06-17 DIAGNOSIS — Z531 Procedure and treatment not carried out because of patient's decision for reasons of belief and group pressure: Secondary | ICD-10-CM | POA: Diagnosis present

## 2014-06-17 DIAGNOSIS — R7989 Other specified abnormal findings of blood chemistry: Secondary | ICD-10-CM

## 2014-06-17 HISTORY — DX: Cerebral infarction, unspecified: I63.9

## 2014-06-17 LAB — GLUCOSE, CAPILLARY
GLUCOSE-CAPILLARY: 164 mg/dL — AB (ref 70–99)
GLUCOSE-CAPILLARY: 127 mg/dL — AB (ref 70–99)

## 2014-06-17 LAB — CBC
HEMATOCRIT: 38.9 % — AB (ref 39.0–52.0)
Hemoglobin: 12.9 g/dL — ABNORMAL LOW (ref 13.0–17.0)
MCH: 29.4 pg (ref 26.0–34.0)
MCHC: 33.2 g/dL (ref 30.0–36.0)
MCV: 88.6 fL (ref 78.0–100.0)
Platelets: 312 10*3/uL (ref 150–400)
RBC: 4.39 MIL/uL (ref 4.22–5.81)
RDW: 14.7 % (ref 11.5–15.5)
WBC: 6.5 10*3/uL (ref 4.0–10.5)

## 2014-06-17 LAB — I-STAT TROPONIN, ED: Troponin i, poc: 1.29 ng/mL (ref 0.00–0.08)

## 2014-06-17 LAB — BASIC METABOLIC PANEL
ANION GAP: 9 (ref 5–15)
BUN: 23 mg/dL (ref 6–23)
CALCIUM: 9 mg/dL (ref 8.4–10.5)
CO2: 24 mmol/L (ref 19–32)
Chloride: 106 mmol/L (ref 96–112)
Creatinine, Ser: 1.02 mg/dL (ref 0.50–1.35)
GFR calc Af Amer: 77 mL/min — ABNORMAL LOW (ref >90)
GFR calc non Af Amer: 67 mL/min — ABNORMAL LOW (ref >90)
Glucose, Bld: 180 mg/dL — ABNORMAL HIGH (ref 70–99)
Potassium: 4.2 mmol/L (ref 3.5–5.1)
SODIUM: 139 mmol/L (ref 135–145)

## 2014-06-17 LAB — TROPONIN I
TROPONIN I: 2.02 ng/mL — AB (ref <0.031)
Troponin I: 1.88 ng/mL (ref <0.031)

## 2014-06-17 LAB — TSH: TSH: 0.325 u[IU]/mL — ABNORMAL LOW (ref 0.350–4.500)

## 2014-06-17 MED ORDER — NITROGLYCERIN 0.4 MG SL SUBL: 0.4000 mg | SUBLINGUAL_TABLET | SUBLINGUAL | Status: DC | PRN

## 2014-06-17 MED ORDER — AMLODIPINE BESYLATE 10 MG PO TABS
10.0000 mg | ORAL_TABLET | Freq: Every day | ORAL | Status: DC
  Administered 2014-06-17 – 2014-06-18 (×2): 10 mg via ORAL
  Filled 2014-06-17 (×3): qty 1

## 2014-06-17 MED ORDER — HEPARIN (PORCINE) IN NACL 100-0.45 UNIT/ML-% IJ SOLN
1350.0000 [IU]/h | INTRAMUSCULAR | Status: DC
  Administered 2014-06-17 – 2014-06-18 (×2): 1400 [IU]/h via INTRAVENOUS
  Filled 2014-06-17 (×3): qty 250

## 2014-06-17 MED ORDER — SODIUM CHLORIDE 0.9 % IJ SOLN: 3.0000 mL | INTRAMUSCULAR | Status: DC | PRN

## 2014-06-17 MED ORDER — ROSUVASTATIN CALCIUM 5 MG PO TABS
5.0000 mg | ORAL_TABLET | ORAL | Status: DC
  Administered 2014-06-18: 5 mg via ORAL
  Filled 2014-06-17: qty 1

## 2014-06-17 MED ORDER — ACETAMINOPHEN 325 MG PO TABS: 650.0000 mg | ORAL_TABLET | ORAL | Status: DC | PRN

## 2014-06-17 MED ORDER — ASPIRIN EC 81 MG PO TBEC
81.0000 mg | DELAYED_RELEASE_TABLET | Freq: Every day | ORAL | Status: DC
  Administered 2014-06-18: 81 mg via ORAL
  Filled 2014-06-17 (×2): qty 1

## 2014-06-17 MED ORDER — HEPARIN BOLUS VIA INFUSION
4000.0000 [IU] | Freq: Once | INTRAVENOUS | Status: AC
  Administered 2014-06-17: 4000 [IU] via INTRAVENOUS
  Filled 2014-06-17: qty 4000

## 2014-06-17 MED ORDER — SODIUM CHLORIDE 0.9 % IV SOLN: 250.0000 mL | INTRAVENOUS | Status: DC | PRN

## 2014-06-17 MED ORDER — INSULIN ASPART 100 UNIT/ML ~~LOC~~ SOLN
0.0000 [IU] | Freq: Three times a day (TID) | SUBCUTANEOUS | Status: DC
Start: 2014-06-17 — End: 2014-06-19
  Administered 2014-06-17: 3 [IU] via SUBCUTANEOUS
  Administered 2014-06-18 – 2014-06-19 (×2): 2 [IU] via SUBCUTANEOUS

## 2014-06-17 MED ORDER — LABETALOL HCL 100 MG PO TABS
100.0000 mg | ORAL_TABLET | Freq: Two times a day (BID) | ORAL | Status: DC
  Administered 2014-06-17 – 2014-06-18 (×3): 100 mg via ORAL
  Filled 2014-06-17 (×6): qty 1

## 2014-06-17 MED ORDER — ONDANSETRON HCL 4 MG/2ML IJ SOLN
4.0000 mg | Freq: Four times a day (QID) | INTRAMUSCULAR | Status: DC | PRN
  Administered 2014-06-18: 4 mg via INTRAVENOUS
  Filled 2014-06-17: qty 2

## 2014-06-17 MED ORDER — MAGNESIUM OXIDE 400 MG PO TABS: 400.0000 mg | ORAL_TABLET | Freq: Every evening | ORAL | Status: DC

## 2014-06-17 MED ORDER — HYDRALAZINE HCL 20 MG/ML IJ SOLN
10.0000 mg | Freq: Four times a day (QID) | INTRAMUSCULAR | Status: DC | PRN
  Administered 2014-06-18: 10 mg via INTRAVENOUS
  Filled 2014-06-17 (×2): qty 1

## 2014-06-17 MED ORDER — ASPIRIN 81 MG PO CHEW
324.0000 mg | CHEWABLE_TABLET | Freq: Once | ORAL | Status: AC
  Administered 2014-06-17: 324 mg via ORAL
  Filled 2014-06-17: qty 4

## 2014-06-17 MED ORDER — SODIUM CHLORIDE 0.9 % IJ SOLN
3.0000 mL | Freq: Two times a day (BID) | INTRAMUSCULAR | Status: DC
  Administered 2014-06-18: 3 mL via INTRAVENOUS

## 2014-06-17 MED ORDER — DOXAZOSIN MESYLATE 2 MG PO TABS: 2.0000 mg | ORAL_TABLET | Freq: Every day | ORAL | Status: DC

## 2014-06-17 MED ORDER — ASPIRIN 81 MG PO CHEW
81.0000 mg | CHEWABLE_TABLET | ORAL | Status: AC
  Administered 2014-06-18: 81 mg via ORAL
  Filled 2014-06-17: qty 1

## 2014-06-17 MED ORDER — SODIUM CHLORIDE 0.9 % IV SOLN
1.0000 mL/kg/h | INTRAVENOUS | Status: DC
  Administered 2014-06-18: 1 mL/kg/h via INTRAVENOUS

## 2014-06-17 NOTE — ED Notes (Signed)
Hollie Beach, RN at [facility] notified of pt's abnormal lab test results

## 2014-06-17 NOTE — Progress Notes (Signed)
ANTICOAGULATION CONSULT NOTE - Initial Consult  Pharmacy Consult for Heparin Indication: chest pain/ACS  Allergies  Allergen Reactions  . Other Other (See Comments)    No  BLOOD PRODUCTS - Pt is Jeohovah's Witness  . Pravastatin     Myalgias    Patient Measurements: Height: 6' 1.5" (186.7 cm) Weight: 219 lb (99.338 kg) IBW/kg (Calculated) : 81.05 Heparin Dosing Weight: 99 kg  Vital Signs: Temp: 97.7 F (36.5 C) (01/31 1113) BP: 172/75 mmHg (01/31 1450) Pulse Rate: 52 (01/31 1450)  Labs:  Recent Labs  06/17/14 1114  HGB 12.9*  HCT 38.9*  PLT 312  CREATININE 1.02    Estimated Creatinine Clearance: 71 mL/min (by C-G formula based on Cr of 1.02).   Medical History: Past Medical History  Diagnosis Date  . Non-STEMI (non-ST elevated myocardial infarction) July 2012    1. Ostial LAD 70% (2 posterior left main for PCI), subtotal occlusion of the RCA  . CAD in native artery July 2012    1. Referred for CABG; 2. Echo EF 55-60%, severe conc LVH, normal systolic fxn, Gr1 DD, Aortic sclerosis w/o stenosis; 3. NEGATIVE Lexiscan Myoview 6/9/'15, EF 53%, no perfusion abnormality, septal and apical HK noted.  . 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 Servando Snare)  . Postoperative atrial fibrillation     Post CABG, no sign recurrence  . HTN (hypertension)   . Type II diabetes mellitus with complication     CAD, occasions  . Hyperlipidemia LDL goal <70 July 2012    Monitor by PCP  . 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 Refusal of blood transfusions as patient is Jehovah's Witness   . Sleep apnea   . Abdominal aneurysm     mild aneursymal dilatation - 04/23/2011  . Dizziness     Assessment: 58 YOM with significant cardiac history including prior CABG in July '12 who presented to the [facility] on 1/31 with CP that has been on and off since Thursday, 1/28. Troponins (+). Pharmacy consulted to start heparin for NSTEMI. Heparin Wt:  99 kg,   The patient was not noted to be on any anticoagulants PTA and has no recent surgeries noted. Baseline Hgb/Hct 12.9/38.9, plts wnl. The patient is noted to be a Jehovah's Witness  Goal of Therapy:  Heparin level 0.3-0.7 units/ml Monitor platelets by anticoagulation protocol: Yes   Plan:  1. Heparin bolus of 4000 units x 1 2. Initiate heparin drip at a rate of 1400 units/hr  3. Daily heparin level 4. Will continue to monitor for any signs/symptoms of bleeding and will follow up with heparin level in 8 hours   Alycia Rossetti, PharmD, BCPS Clinical Pharmacist Pager: [phone] 06/17/2014 3:16 PM

## 2014-06-17 NOTE — ED Notes (Signed)
Pt. Stated, i started having chest pain on Thursday. Off and on it seems worse today.

## 2014-06-17 NOTE — H&P (Signed)
Cardiologist:    [name] is an 81 y.o. male.   Chief Complaint: Chest Pain HPI:   [name] is a 81 y.o. male with a PMH of CAd, CABG x2(LIMA to LAD, SVG to PDA-July 2012), postop afib, HTN , DM2, HLD, OSA. He was seen at [facility] in June 2015 to evaluate chest pain and was ruled out for MI. He had a Myoview stress test that was low risk. He then saw Dr. Gwenlyn Found for evaluation of a soft carotid bruit in the setting of dizziness and history of stroke. Carotid Dopplers revealed a mild amount of fibrous plaque without evidence of significant stenosis.  His last 2-D echocardiogram was March 2014 ejection fraction was 55-60%.  There was severe concentric hypertrophy.  Grade 1 diastolic dysfunction.   Patient reports she started developing chest pain on Thursday with activity. He stopped doing all activities and the pain eased off some but not entirely.  He is pain free now.  Reports some mild shortness of breath, some dizziness upon standing, mild lower extremity edema and mild diaphoresis. No radiation to arm neck back or jaw.   He had more pain on Friday.  He did not take any NTG but did take ASA.   Mdications:    Medication Sig  amLODipine (NORVASC) 10 MG tablet Take 10 mg by mouth daily.   aspirin EC 81 MG tablet Take 81 mg by mouth every morning.  Bioflavonoid Products (SUPER C-500) TABS Take 1 tablet by mouth every evening.   cholecalciferol (VITAMIN D) 400 UNITS TABS tablet Take 400 Units by mouth every evening.   Cyanocobalamin 5000 MCG SUBL Take 5,000 mcg by mouth every morning.  doxazosin (CARDURA) 2 MG tablet Take 1 tablet (2 mg total) by mouth at bedtime.  furosemide (LASIX) 20 MG tablet Take 1 tablet (20 mg total) by mouth daily.  glimepiride (AMARYL) 4 MG tablet Take 4 mg by mouth 2 (two) times daily with a meal.   labetalol (NORMODYNE) 200 MG tablet Take 0.5 tablets (100 mg total) by mouth 2 (two) times daily.  losartan-hydrochlorothiazide (HYZAAR) 100-25 MG  per tablet Take 1 tablet by mouth every morning.   magnesium oxide (MAG-OX) 400 MG tablet Take 400 mg by mouth every evening.   metformin (FORTAMET) 1000 MG (OSM) 24 hr tablet Take 1,000 mg by mouth 2 (two) times daily with a meal.    Potassium Gluconate 595 MG CAPS Take 595 mg by mouth every morning.   rosuvastatin (CRESTOR) 5 MG tablet Take 1 tablet on Mondays and Fridays only    Past Medical History  Diagnosis Date  . Non-STEMI (non-ST elevated myocardial infarction) July 2012    1. Ostial LAD 70% (2 posterior left main for PCI), subtotal occlusion of the RCA  . CAD in native artery July 2012    1. Referred for CABG; 2. Echo EF 55-60%, severe conc LVH, normal systolic fxn, Gr1 DD, Aortic sclerosis w/o stenosis; 3. NEGATIVE Lexiscan Myoview 6/9/'15, EF 53%, no perfusion abnormality, septal and apical HK noted.  . 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 Servando Snare)  . Postoperative atrial fibrillation     Post CABG, no sign recurrence  . HTN (hypertension)   . Type II diabetes mellitus with complication     CAD, occasions  . Hyperlipidemia LDL goal <70 July 2012    Monitor by PCP  . 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 Refusal of blood transfusions as patient is Jehovah's Witness   . Sleep apnea   . Abdominal aneurysm     mild aneursymal dilatation - 04/23/2011  . Dizziness     Past Surgical History  Procedure Laterality Date  . Cardiac catheterization  12/11/2010    Dr. Chase Picket - subsequent cath - normal LV systolic function, no renal artery stenosis, severe 2-vessel disease with subtotaled RCA prox and distal 60% lesion and complex 70% area of narrowing of ostium of LAD  . Coronary artery bypass graft  12/15/2010    X2, Dr Servando Snare; LIMA to LAD, SVG to PDA;   Marland Kitchen Cystoscopy with urethral dilatation    . Transthoracic echocardiogram  07/28/2012    EF 55-60%, severe Conc LVH, Nl Systolic fxn, G1 DD,  Ao Sclerosis w/o  stenosis; CRO PFO    Family History  Problem Relation Age of Onset  . Heart Problems Father 33   Social History:  reports that he quit smoking about 57 years ago. His smoking use included Cigarettes. He has never used smokeless tobacco. He reports that he does not drink alcohol or use illicit drugs.  Allergies:  Allergies  Allergen Reactions  . Other Other (See Comments)    No  BLOOD PRODUCTS - Pt is Jeohovah's Witness  . Pravastatin     Myalgias     (Not in a hospital admission)  Results for orders placed or performed during the hospital encounter of 06/17/14 (from the past 48 hour(s))  CBC     Status: Abnormal   Collection Time: 06/17/14 11:14 AM  Result Value Ref Range   WBC 6.5 4.0 - 10.5 K/uL   RBC 4.39 4.22 - 5.81 MIL/uL   Hemoglobin 12.9 (L) 13.0 - 17.0 g/dL   HCT 38.9 (L) 39.0 - 52.0 %   MCV 88.6 78.0 - 100.0 fL   MCH 29.4 26.0 - 34.0 pg   MCHC 33.2 30.0 - 36.0 g/dL   RDW 14.7 11.5 - 15.5 %   Platelets 312 150 - 400 K/uL  Basic metabolic panel     Status: Abnormal   Collection Time: 06/17/14 11:14 AM  Result Value Ref Range   Sodium 139 135 - 145 mmol/L   Potassium 4.2 3.5 - 5.1 mmol/L   Chloride 106 96 - 112 mmol/L   CO2 24 19 - 32 mmol/L   Glucose, Bld 180 (H) 70 - 99 mg/dL   BUN 23 6 - 23 mg/dL   Creatinine, Ser 1.02 0.50 - 1.35 mg/dL   Calcium 9.0 8.4 - 10.5 mg/dL   GFR calc non Af Amer 67 (L) >90 mL/min   GFR calc Af Amer 77 (L) >90 mL/min    Comment: (NOTE) The eGFR has been calculated using the CKD EPI equation. This calculation has not been validated in all clinical situations. eGFR's persistently <90 mL/min signify possible Chronic Kidney Disease.    Anion gap 9 5 - 15  I-stat troponin, ED (not at [facility])     Status: Abnormal   Collection Time: 06/17/14 11:47 AM  Result Value Ref Range   Troponin i, poc 1.29 (HH) 0.00 - 0.08 ng/mL   Comment NOTIFIED PHYSICIAN    Comment 3            Comment: Due to the release kinetics of cTnI, a negative  result within the first hours of the onset of symptoms does not rule out myocardial infarction with certainty. If myocardial infarction is  still suspected, repeat the test at appropriate intervals.    Dg Chest Port 1 View  06/17/2014   CLINICAL DATA:  81 year old male with chest pain since Thursday intermittently. Worsening today.  EXAM: PORTABLE CHEST - 1 VIEW  COMPARISON:  Chest x-ray 10/23/2013.  FINDINGS: No consolidative airspace disease. No pleural effusions. No evidence of pulmonary edema. No suspicious appearing pulmonary nodules or masses. No pneumothorax. Heart size is mildly enlarged. The patient is rotated to the right on today's exam, resulting in distortion of the mediastinal contours and reduced diagnostic sensitivity and specificity for mediastinal pathology. Tortuosity and atherosclerosis of the thoracic aorta. Status post median sternotomy for CABG.  IMPRESSION: 1. No radiographic evidence of acute cardiopulmonary disease. 2. Mild cardiomegaly. 3. Atherosclerosis.   Electronically Signed   By: Vinnie Langton M.D.   On: 06/17/2014 13:27    Review of Systems  Constitutional: Positive for diaphoresis. Negative for fever.  HENT: Negative for congestion and sore throat.   Respiratory: Positive for shortness of breath.   Cardiovascular: Positive for chest pain and leg swelling. Negative for orthopnea and PND.  Gastrointestinal: Negative for nausea, vomiting, abdominal pain, blood in stool and melena.  Musculoskeletal: Negative for myalgias and neck pain.  Neurological: Positive for dizziness.  All other systems reviewed and are negative.   Blood pressure 150/62, pulse 51, temperature 97.7 F (36.5 C), resp. rate 12, height 6' 1.5" (1.867 m), weight 219 lb (99.338 kg), SpO2 92 %. Physical Exam  Nursing note and vitals reviewed. Constitutional: He is oriented to person, place, and time. He appears well-developed and well-nourished. No distress.  HENT:  Head: Normocephalic and  atraumatic.  Mouth/Throat: No oropharyngeal exudate.  Eyes: EOM are normal. Pupils are equal, round, and reactive to light. No scleral icterus.  Neck: Normal range of motion. Neck supple. No JVD present.  Cardiovascular: Normal rate, regular rhythm, S1 normal and S2 normal.   No murmur heard. Pulses:      Radial pulses are 2+ on the right side, and 2+ on the left side.       Dorsalis pedis pulses are 2+ on the right side, and 2+ on the left side.  Respiratory: Effort normal and breath sounds normal. He has no wheezes. He has no rales.  GI: Soft. Bowel sounds are normal. He exhibits no distension. There is no tenderness.  Musculoskeletal: He exhibits edema (trace right lower extremity edema).  Lymphadenopathy:    He has no cervical adenopathy.  Neurological: He is alert and oriented to person, place, and time. He exhibits normal muscle tone. Coordination normal.  Skin: Skin is warm and dry.  Psychiatric: He has a normal mood and affect.     Assessment/Plan Principal Problem:   NSTEMI (non-ST elevated myocardial infarction) Active Problems:   Diabetes mellitus type 2 in obese   Moderate essential hypertension   Apnea, sleep   Multivessel CAD -  near ostial LAD 70% and RCA subtotal occlusion - status post CABG x2;   S/P CABG x 2   Hyperlipidemia with target LDL less than 70   Obesity (BMI 30-39.9)  Patient will be admitted to telemetry floor. Is currently chest pain-free. We have started IV heparin. Continue to cycle troponin every 6 hours.  Continue home meds which include amlodipine, aspirin, labetalol, Crestor. Will hold metformin losartan and Lasix and schedule left heart catheter tomorrow.   Tarri Fuller [facility] 06/17/2014, 2:11 PM  Attending Note   Patient seen and discussed with PA Samara Snide, I agree with his  documentation. 81 yo male hx of CAD with prior CABG in July 2012 (LIMA-LAD, SVG-PDA), HTN, DM2, HL, Jehovahs witness admitted with chest pain.  10/2013 MPI no  ischemia 07/2012 Echo LVEF 83-35%, grade I diastolic dysfunction Cr 8.25, GFR 67 K 4.2 Hgb 12.9 Plt 312 Trop 1.29 CXR no acute process EKG sinus brady, 1st degree av block, LAE, low voltage, mild ST/T changes lateral limb leads that are old.   NSTEMI, will initiate medical therapy with ASA, beta blocker, ACE/ARB, statin, and anticoagulation. History of side effects to statins, most recently tried on low dose crestor, will continue. Defer additional antiplatelet loading until time of cath.   Zandra Abts MD

## 2014-06-17 NOTE — ED Notes (Signed)
Vanita Panda, MD aware of pt's abnormal lab test results

## 2014-06-17 NOTE — ED Notes (Signed)
Attempted report 

## 2014-06-17 NOTE — ED Provider Notes (Signed)
CSN: [id number]     Arrival date & time 06/17/14  1058 History   First MD Initiated Contact with Patient 06/17/14 1118     Chief Complaint  Patient presents with  . Chest Pain     (Consider location/radiation/quality/duration/timing/severity/associated sxs/prior Treatment) Patient is a 81 y.o. male presenting with chest pain. The history is provided by the patient.  Chest Pain Associated symptoms: no abdominal pain, no back pain, no cough, no fever, no headache, no shortness of breath and not vomiting   pt with hx cad, s/p cabg 2012, c/o several episodes of chest pain in the past 3-4 days.  Episodes have occurred at rest. Mid to left chest. Dull. Moderate. Non radiating. Last several minutes to hours. Pt initially thought unlike prior cardiac cp, but then states cp felt worse.  With one episode, +diaphoresis, otherwise no associated sob or nv.  Takes 81 mg asa q day. No ntg use. States up until past few days, no chest pain or discomfort. Cp is not pleuritic, and is not constant. No cough or uri c/o. No fever or chills. No heartburn. No back or flank pain. No leg pain or swelling.     Past Medical History  Diagnosis Date  . Non-STEMI (non-ST elevated myocardial infarction) July 2012    1. Ostial LAD 70% (2 posterior left main for PCI), subtotal occlusion of the RCA  . CAD in native artery July 2012    1. Referred for CABG; 2. Echo EF 55-60%, severe conc LVH, normal systolic fxn, Gr1 DD, Aortic sclerosis w/o stenosis; 3. NEGATIVE Lexiscan Myoview 6/9/'15, EF 53%, no perfusion abnormality, septal and apical HK noted.  . 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 Servando Snare)  . Postoperative atrial fibrillation     Post CABG, no sign recurrence  . HTN (hypertension)   . Type II diabetes mellitus with complication     CAD, occasions  . Hyperlipidemia LDL goal <70 July 2012    Monitor by PCP  . 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 Refusal  of blood transfusions as patient is Jehovah's Witness   . Sleep apnea   . Abdominal aneurysm     mild aneursymal dilatation - 04/23/2011  . Dizziness    Past Surgical History  Procedure Laterality Date  . Cardiac catheterization  12/11/2010    Dr. Chase Picket - subsequent cath - normal LV systolic function, no renal artery stenosis, severe 2-vessel disease with subtotaled RCA prox and distal 60% lesion and complex 70% area of narrowing of ostium of LAD  . Coronary artery bypass graft  12/15/2010    X2, Dr Servando Snare; LIMA to LAD, SVG to PDA;   Marland Kitchen Cystoscopy with urethral dilatation    . Transthoracic echocardiogram  07/28/2012    EF 55-60%, severe Conc LVH, Nl Systolic fxn, G1 DD,  Ao Sclerosis w/o stenosis; CRO PFO   Family History  Problem Relation Age of Onset  . Heart Problems Father 23   History  Substance Use Topics  . Smoking status: Former Smoker    Types: Cigarettes    Quit date: 05/27/1957  . Smokeless tobacco: Never Used  . Alcohol Use: No    Review of Systems  Constitutional: Negative for fever and chills.  HENT: Negative for sore throat.   Eyes: Negative for redness.  Respiratory: Negative for cough and shortness of breath.   Cardiovascular: Positive for chest pain.  Gastrointestinal:  Negative for vomiting and abdominal pain.  Genitourinary: Negative for flank pain.  Musculoskeletal: Negative for back pain and neck pain.  Skin: Negative for rash.  Neurological: Negative for headaches.  Hematological: Does not bruise/bleed easily.  Psychiatric/Behavioral: Negative for confusion.      Allergies  Other and Pravastatin  Home Medications   Prior to Admission medications   Medication Sig Start Date End Date Taking? Authorizing Provider  amLODipine (NORVASC) 10 MG tablet Take 10 mg by mouth daily.     Historical Provider, MD  aspirin EC 81 MG tablet Take 81 mg by mouth every morning.    Historical Provider, MD  Bioflavonoid Products (SUPER C-500) TABS Take 1  tablet by mouth every evening.     Historical Provider, MD  cholecalciferol (VITAMIN D) 400 UNITS TABS tablet Take 400 Units by mouth every evening.     Historical Provider, MD  Cyanocobalamin 5000 MCG SUBL Take 5,000 mcg by mouth every morning.    Historical Provider, MD  doxazosin (CARDURA) 2 MG tablet Take 1 tablet (2 mg total) by mouth at bedtime. 08/31/13   Erlene Quan, PA-C  furosemide (LASIX) 20 MG tablet Take 1 tablet (20 mg total) by mouth daily. 03/29/14   Leonie Man, MD  glimepiride (AMARYL) 4 MG tablet Take 4 mg by mouth 2 (two) times daily with a meal.     Historical Provider, MD  labetalol (NORMODYNE) 200 MG tablet Take 0.5 tablets (100 mg total) by mouth 2 (two) times daily. 02/23/14   Lorretta Harp, MD  losartan-hydrochlorothiazide (HYZAAR) 100-25 MG per tablet Take 1 tablet by mouth every morning.     Historical Provider, MD  magnesium oxide (MAG-OX) 400 MG tablet Take 400 mg by mouth every evening.     Historical Provider, MD  metformin (FORTAMET) 1000 MG (OSM) 24 hr tablet Take 1,000 mg by mouth 2 (two) times daily with a meal.      Historical Provider, MD  Potassium Gluconate 595 MG CAPS Take 595 mg by mouth every morning.     Historical Provider, MD  rosuvastatin (CRESTOR) 5 MG tablet Take 1 tablet on Mondays and Fridays only 03/29/14   Leonie Man, MD   BP 160/74 mmHg  Pulse 52  Temp(Src) 97.7 F (36.5 C)  Resp 19  Ht 6' 1.5" (1.867 m)  Wt 219 lb (99.338 kg)  BMI 28.50 kg/m2  SpO2 95% Physical Exam  Constitutional: He is oriented to person, place, and time. He appears well-developed and well-nourished. No distress.  HENT:  Head: Atraumatic.  Mouth/Throat: Oropharynx is clear and moist.  Eyes: Conjunctivae are normal. No scleral icterus.  Neck: Neck supple. No tracheal deviation present.  Cardiovascular: Normal rate, regular rhythm, normal heart sounds and intact distal pulses.  Exam reveals no gallop and no friction rub.   No murmur  heard. Pulmonary/Chest: Effort normal and breath sounds normal. No accessory muscle usage. No respiratory distress. He exhibits no tenderness.  Abdominal: Soft. He exhibits no distension. There is no tenderness.  Genitourinary:  No cva tenderness  Musculoskeletal: Normal range of motion. He exhibits no edema or tenderness.  Neurological: He is alert and oriented to person, place, and time.  Skin: Skin is warm and dry. He is not diaphoretic.  No shingles/rash in area of pain.   Psychiatric: He has a normal mood and affect.  Nursing note and vitals reviewed.   ED Course  Procedures (including critical care time) Labs Review  Results for orders placed or  performed during the hospital encounter of 06/17/14  CBC  Result Value Ref Range   WBC 6.5 4.0 - 10.5 K/uL   RBC 4.39 4.22 - 5.81 MIL/uL   Hemoglobin 12.9 (L) 13.0 - 17.0 g/dL   HCT 38.9 (L) 39.0 - 52.0 %   MCV 88.6 78.0 - 100.0 fL   MCH 29.4 26.0 - 34.0 pg   MCHC 33.2 30.0 - 36.0 g/dL   RDW 14.7 11.5 - 15.5 %   Platelets 312 150 - 400 K/uL  Basic metabolic panel  Result Value Ref Range   Sodium 139 135 - 145 mmol/L   Potassium 4.2 3.5 - 5.1 mmol/L   Chloride 106 96 - 112 mmol/L   CO2 24 19 - 32 mmol/L   Glucose, Bld 180 (H) 70 - 99 mg/dL   BUN 23 6 - 23 mg/dL   Creatinine, Ser 1.02 0.50 - 1.35 mg/dL   Calcium 9.0 8.4 - 10.5 mg/dL   GFR calc non Af Amer 67 (L) >90 mL/min   GFR calc Af Amer 77 (L) >90 mL/min   Anion gap 9 5 - 15  I-stat troponin, ED (not at [facility])  Result Value Ref Range   Troponin i, poc 1.29 (HH) 0.00 - 0.08 ng/mL   Comment NOTIFIED PHYSICIAN    Comment 3           Dg Chest Port 1 View  06/17/2014   CLINICAL DATA:  81 year old male with chest pain since Thursday intermittently. Worsening today.  EXAM: PORTABLE CHEST - 1 VIEW  COMPARISON:  Chest x-ray 10/23/2013.  FINDINGS: No consolidative airspace disease. No pleural effusions. No evidence of pulmonary edema. No suspicious appearing pulmonary nodules or  masses. No pneumothorax. Heart size is mildly enlarged. The patient is rotated to the right on today's exam, resulting in distortion of the mediastinal contours and reduced diagnostic sensitivity and specificity for mediastinal pathology. Tortuosity and atherosclerosis of the thoracic aorta. Status post median sternotomy for CABG.  IMPRESSION: 1. No radiographic evidence of acute cardiopulmonary disease. 2. Mild cardiomegaly. 3. Atherosclerosis.   Electronically Signed   By: Vinnie Langton M.D.   On: 06/17/2014 13:27        EKG Interpretation   Date/Time:  Sunday June 17 2014 11:03:14 EST Ventricular Rate:  56 PR Interval:  218 QRS Duration: 88 QT Interval:  414 QTC Calculation: 399 R Axis:   27 Text Interpretation:  Sinus bradycardia with 1st degree A-V block  Otherwise normal ECG Confirmed by Ashok Cordia  MD, Lennette Bihari ([id number]) on 06/17/2014  12:06:46 PM      MDM   Iv ns. Labs. ecg.  Reviewed nursing notes and prior charts for additional history.   Continuous pulse ox and monitor.   Chewable asa.  Trop elevated -  Cardiology called.  Recheck pt, no cp.   Discussed with Dr Einar Gip - he indicates he is only on call for his pts and unassigned.  Discussed w Dr Ellyn Hack - he indicates call Dr Acie Fredrickson to admit.  Discussed w Dr Acie Fredrickson - will admit.   Recheck pt, no cp.     Mirna Mires, MD 06/17/14 [phone]

## 2014-06-18 ENCOUNTER — Encounter (HOSPITAL_COMMUNITY)
Admission: EM | Disposition: A | Discharge status: Home / Self Care | Payer: Medicare Other | Source: Home / Self Care | Attending: Cardiology

## 2014-06-18 DIAGNOSIS — Z87891 Personal history of nicotine dependence: Secondary | ICD-10-CM | POA: Diagnosis not present

## 2014-06-18 DIAGNOSIS — I25700 Atherosclerosis of coronary artery bypass graft(s), unspecified, with unstable angina pectoris: Secondary | ICD-10-CM | POA: Diagnosis not present

## 2014-06-18 DIAGNOSIS — I4891 Unspecified atrial fibrillation: Secondary | ICD-10-CM | POA: Diagnosis present

## 2014-06-18 DIAGNOSIS — E785 Hyperlipidemia, unspecified: Secondary | ICD-10-CM | POA: Diagnosis present

## 2014-06-18 DIAGNOSIS — Z7902 Long term (current) use of antithrombotics/antiplatelets: Secondary | ICD-10-CM | POA: Diagnosis not present

## 2014-06-18 DIAGNOSIS — Z6829 Body mass index (BMI) 29.0-29.9, adult: Secondary | ICD-10-CM | POA: Diagnosis not present

## 2014-06-18 DIAGNOSIS — Z951 Presence of aortocoronary bypass graft: Secondary | ICD-10-CM | POA: Diagnosis not present

## 2014-06-18 DIAGNOSIS — I10 Essential (primary) hypertension: Secondary | ICD-10-CM | POA: Diagnosis present

## 2014-06-18 DIAGNOSIS — E669 Obesity, unspecified: Secondary | ICD-10-CM | POA: Diagnosis present

## 2014-06-18 DIAGNOSIS — Z531 Procedure and treatment not carried out because of patient's decision for reasons of belief and group pressure: Secondary | ICD-10-CM | POA: Diagnosis present

## 2014-06-18 DIAGNOSIS — G4733 Obstructive sleep apnea (adult) (pediatric): Secondary | ICD-10-CM | POA: Diagnosis present

## 2014-06-18 DIAGNOSIS — Z7982 Long term (current) use of aspirin: Secondary | ICD-10-CM | POA: Diagnosis not present

## 2014-06-18 DIAGNOSIS — I2582 Chronic total occlusion of coronary artery: Secondary | ICD-10-CM | POA: Diagnosis present

## 2014-06-18 DIAGNOSIS — I440 Atrioventricular block, first degree: Secondary | ICD-10-CM | POA: Diagnosis present

## 2014-06-18 DIAGNOSIS — I213 ST elevation (STEMI) myocardial infarction of unspecified site: Secondary | ICD-10-CM

## 2014-06-18 DIAGNOSIS — I2510 Atherosclerotic heart disease of native coronary artery without angina pectoris: Secondary | ICD-10-CM

## 2014-06-18 DIAGNOSIS — I209 Angina pectoris, unspecified: Secondary | ICD-10-CM | POA: Diagnosis not present

## 2014-06-18 DIAGNOSIS — I25810 Atherosclerosis of coronary artery bypass graft(s) without angina pectoris: Secondary | ICD-10-CM | POA: Diagnosis present

## 2014-06-18 DIAGNOSIS — I252 Old myocardial infarction: Secondary | ICD-10-CM | POA: Diagnosis not present

## 2014-06-18 DIAGNOSIS — I214 Non-ST elevation (NSTEMI) myocardial infarction: Secondary | ICD-10-CM | POA: Diagnosis present

## 2014-06-18 DIAGNOSIS — R0602 Shortness of breath: Secondary | ICD-10-CM | POA: Diagnosis not present

## 2014-06-18 DIAGNOSIS — E119 Type 2 diabetes mellitus without complications: Secondary | ICD-10-CM | POA: Diagnosis not present

## 2014-06-18 DIAGNOSIS — Z8673 Personal history of transient ischemic attack (TIA), and cerebral infarction without residual deficits: Secondary | ICD-10-CM | POA: Diagnosis not present

## 2014-06-18 DIAGNOSIS — Z79899 Other long term (current) drug therapy: Secondary | ICD-10-CM | POA: Diagnosis not present

## 2014-06-18 DIAGNOSIS — E1165 Type 2 diabetes mellitus with hyperglycemia: Secondary | ICD-10-CM | POA: Diagnosis present

## 2014-06-18 HISTORY — DX: ST elevation (STEMI) myocardial infarction of unspecified site: I21.3

## 2014-06-18 HISTORY — PX: LEFT HEART CATHETERIZATION WITH CORONARY ANGIOGRAM: SHX5451

## 2014-06-18 HISTORY — PX: CORONARY ANGIOPLASTY WITH STENT PLACEMENT: SHX49

## 2014-06-18 LAB — POCT ACTIVATED CLOTTING TIME: ACTIVATED CLOTTING TIME: 485 s

## 2014-06-18 LAB — HEMOGLOBIN A1C
HEMOGLOBIN A1C: 8 % — AB (ref 4.8–5.6)
Mean Plasma Glucose: 183 mg/dL

## 2014-06-18 LAB — BASIC METABOLIC PANEL
Anion gap: 7 (ref 5–15)
BUN: 20 mg/dL (ref 6–23)
CO2: 27 mmol/L (ref 19–32)
Calcium: 9.1 mg/dL (ref 8.4–10.5)
Chloride: 105 mmol/L (ref 96–112)
Creatinine, Ser: 0.91 mg/dL (ref 0.50–1.35)
GFR calc Af Amer: 90 mL/min — ABNORMAL LOW (ref >90)
GFR calc non Af Amer: 77 mL/min — ABNORMAL LOW (ref >90)
Glucose, Bld: 96 mg/dL (ref 70–99)
Potassium: 3.8 mmol/L (ref 3.5–5.1)
Sodium: 139 mmol/L (ref 135–145)

## 2014-06-18 LAB — GLUCOSE, CAPILLARY
GLUCOSE-CAPILLARY: 99 mg/dL (ref 70–99)
GLUCOSE-CAPILLARY: 175 mg/dL — AB (ref 70–99)
Glucose-Capillary: 133 mg/dL — ABNORMAL HIGH (ref 70–99)
Glucose-Capillary: 224 mg/dL — ABNORMAL HIGH (ref 70–99)

## 2014-06-18 LAB — HEPARIN LEVEL (UNFRACTIONATED)
HEPARIN UNFRACTIONATED: 0.5 [IU]/mL (ref 0.30–0.70)
Heparin Unfractionated: 0.69 IU/mL (ref 0.30–0.70)

## 2014-06-18 LAB — LIPID PANEL
Cholesterol: 140 mg/dL (ref 0–200)
HDL: 42 mg/dL (ref >39)
LDL Cholesterol: 83 mg/dL (ref 0–99)
Total CHOL/HDL Ratio: 3.3 RATIO
Triglycerides: 75 mg/dL (ref <150)
VLDL: 15 mg/dL (ref 0–40)

## 2014-06-18 LAB — PROTIME-INR
INR: 1.07 (ref 0.00–1.49)
Prothrombin Time: 14 seconds (ref 11.6–15.2)

## 2014-06-18 LAB — TROPONIN I: Troponin I: 1.82 ng/mL (ref <0.031)

## 2014-06-18 SURGERY — LEFT HEART CATHETERIZATION WITH CORONARY ANGIOGRAM
Anesthesia: LOCAL

## 2014-06-18 MED ORDER — TICAGRELOR 90 MG PO TABS
90.0000 mg | ORAL_TABLET | Freq: Two times a day (BID) | ORAL | Status: DC
  Administered 2014-06-19: 90 mg via ORAL
  Filled 2014-06-18 (×3): qty 1

## 2014-06-18 MED ORDER — LIDOCAINE HCL (PF) 1 % IJ SOLN
INTRAMUSCULAR | Status: AC
  Filled 2014-06-18: qty 30

## 2014-06-18 MED ORDER — MIDAZOLAM HCL 2 MG/2ML IJ SOLN
INTRAMUSCULAR | Status: AC
  Filled 2014-06-18: qty 2

## 2014-06-18 MED ORDER — SODIUM CHLORIDE 0.9 % IV SOLN: Freq: Once | INTRAVENOUS | Status: DC

## 2014-06-18 MED ORDER — FENTANYL CITRATE 0.05 MG/ML IJ SOLN
INTRAMUSCULAR | Status: AC
  Filled 2014-06-18: qty 2

## 2014-06-18 MED ORDER — VERAPAMIL HCL 2.5 MG/ML IV SOLN
INTRAVENOUS | Status: AC
Start: 2014-06-18 — End: 2014-06-18
  Filled 2014-06-18: qty 2

## 2014-06-18 MED ORDER — HYDRALAZINE HCL 20 MG/ML IJ SOLN
INTRAMUSCULAR | Status: AC
  Filled 2014-06-18: qty 1

## 2014-06-18 MED ORDER — HEPARIN (PORCINE) IN NACL 2-0.9 UNIT/ML-% IJ SOLN
INTRAMUSCULAR | Status: AC
  Filled 2014-06-18: qty 1500

## 2014-06-18 MED ORDER — HYDRALAZINE HCL 20 MG/ML IJ SOLN
20.0000 mg | Freq: Once | INTRAMUSCULAR | Status: AC
  Administered 2014-06-18: 20 mg via INTRAVENOUS

## 2014-06-18 MED ORDER — HEPARIN SODIUM (PORCINE) 1000 UNIT/ML IJ SOLN
INTRAMUSCULAR | Status: AC
  Filled 2014-06-18: qty 1

## 2014-06-18 MED ORDER — TICAGRELOR 90 MG PO TABS
ORAL_TABLET | ORAL | Status: AC
  Filled 2014-06-18: qty 2

## 2014-06-18 MED ORDER — BIVALIRUDIN 250 MG IV SOLR
INTRAVENOUS | Status: AC
  Filled 2014-06-18: qty 250

## 2014-06-18 MED ORDER — NITROGLYCERIN 1 MG/10 ML FOR IR/CATH LAB
INTRA_ARTERIAL | Status: AC
  Filled 2014-06-18: qty 10

## 2014-06-18 NOTE — Progress Notes (Signed)
Paged by patient's nurse, cath scheduled to 1630 today, want diet order. Will get clear liquid diet before 10AM, NPO after 10AM  Hilbert Corrigan PA Pager: [phone]

## 2014-06-18 NOTE — H&P (View-Only) (Signed)
Patient Name: [name] Date of Encounter: 06/18/2014     Principal Problem:   NSTEMI (non-ST elevated myocardial infarction) Active Problems:   Diabetes mellitus type 2 in obese   Moderate essential hypertension   Apnea, sleep   Multivessel CAD -  near ostial LAD 70% and RCA subtotal occlusion - status post CABG x2;   S/P CABG x 2   Hyperlipidemia with target LDL less than 70   Obesity (BMI 30-39.9)    SUBJECTIVE  No CP or  SOB.  CURRENT MEDS . amLODipine  10 mg Oral Daily  . aspirin EC  81 mg Oral Daily  . insulin aspart  0-15 Units Subcutaneous TID WC  . labetalol  100 mg Oral BID  . rosuvastatin  5 mg Oral Once per day on Mon Thu  . sodium chloride  3 mL Intravenous Q12H    OBJECTIVE  Filed Vitals:   06/17/14 1450 06/17/14 1525 06/17/14 1620 06/17/14 2053  BP: 172/75 173/74 177/78 161/73  Pulse: 52     Temp:  97.8 F (36.6 C) 97.9 F (36.6 C) 97.9 F (36.6 C)  TempSrc:  Oral Oral Oral  Resp: 20 16 18 18   Height:      Weight:      SpO2: 96% 97% 96% 96%    Intake/Output Summary (Last 24 hours) at 06/18/14 0846 Last data filed at 06/18/14 0600  Gross per 24 hour  Intake    462 ml  Output   1400 ml  Net   -938 ml   Filed Weights   06/17/14 1113  Weight: 219 lb (99.338 kg)    PHYSICAL EXAM  General: Pleasant, NAD. Neuro: Alert and oriented X 3. Moves all extremities spontaneously. Psych: Normal affect. HEENT:  Normal  Neck: Supple without bruits or JVD. Lungs:  Resp regular and unlabored, CTA. Heart: RRR no s3, s4, or murmurs. Abdomen: Soft, non-tender, non-distended, BS + x 4.  Extremities: No clubbing, cyanosis or edema. DP/PT/Radials 2+ and equal bilaterally.  Accessory Clinical Findings  CBC  Recent Labs  06/17/14 1114  WBC 6.5  HGB 12.9*  HCT 38.9*  MCV 88.6  PLT 123456   Basic Metabolic Panel  Recent Labs  06/17/14 1114 06/18/14 0359  NA 139 139  K 4.2 3.8  CL 106 105  CO2 24 27  GLUCOSE 180* 96  BUN 23 20    CREATININE 1.02 0.91  CALCIUM 9.0 9.1   Liver Function Tests No results for input(s): AST, ALT, ALKPHOS, BILITOT, PROT, ALBUMIN in the last 72 hours. No results for input(s): LIPASE, AMYLASE in the last 72 hours. Cardiac Enzymes  Recent Labs  06/17/14 1700 06/17/14 2202 06/18/14 0359  TROPONINI 2.02* 1.88* 1.82*   Fasting Lipid Panel  Recent Labs  06/18/14 0359  CHOL 140  HDL 42  LDLCALC 83  TRIG 75  CHOLHDL 3.3   Thyroid Function Tests  Recent Labs  06/17/14 1700  TSH 0.325*    TELE   Sinus brady  Radiology/Studies  Dg Chest Port 1 View  06/17/2014   CLINICAL DATA:  81 year old male with chest pain since Thursday intermittently. Worsening today.  EXAM: PORTABLE CHEST - 1 VIEW  COMPARISON:  Chest x-ray 10/23/2013.  FINDINGS: No consolidative airspace disease. No pleural effusions. No evidence of pulmonary edema. No suspicious appearing pulmonary nodules or masses. No pneumothorax. Heart size is mildly enlarged. The patient is rotated to the right on today's exam, resulting in distortion of the mediastinal contours and reduced  diagnostic sensitivity and specificity for mediastinal pathology. Tortuosity and atherosclerosis of the thoracic aorta. Status post median sternotomy for CABG.  IMPRESSION: 1. No radiographic evidence of acute cardiopulmonary disease. 2. Mild cardiomegaly. 3. Atherosclerosis.   Electronically Signed   By: Vinnie Langton M.D.   On: 06/17/2014 13:27    ASSESSMENT AND PLAN  [name] is a 80 y.o. male with a PMH of CAD s/p CABG x2(LIMA to LAD, SVG to PDA-11/2010), postop afib, HTN , DM2, HLD, OSA and Jehovahs witness who presented to [facility] on 06/17/14 with chest pain and found to have NSTEMI.  NSTEMI- Troponin 2.02--> 1.88 --> 1.82.  -- LHC today 16:30 -- ASA, beta blocker, ACE/ARB, statin, and heparin   HLD- cont Crestor.   HTN- not well controlled. HR in 50s. Continue amlodipine 10mg  and labetalol 100mg  BID. At home on Hyzaar. Will add  back losartan 100mg  today.    Judy Pimple PA-C  Pager [phone]   Patient seen and examined. Agree with assessment and plan. Will need diagnostic cath; hold losartan today in anticipation of late cath if schedule allows. Troponin trending downward. The risks and benefits of a cardiac catheterization including, but not limited to, death, stroke, MI, kidney damage and bleeding were discussed with the patient who indicates understanding and agrees to proceed.   Troy Sine, MD, [facility] 06/18/2014 9:25 AM

## 2014-06-18 NOTE — Progress Notes (Addendum)
Patient Name: [name] Date of Encounter: 06/18/2014     Principal Problem:   NSTEMI (non-ST elevated myocardial infarction) Active Problems:   Diabetes mellitus type 2 in obese   Moderate essential hypertension   Apnea, sleep   Multivessel CAD -  near ostial LAD 70% and RCA subtotal occlusion - status post CABG x2;   S/P CABG x 2   Hyperlipidemia with target LDL less than 70   Obesity (BMI 30-39.9)    SUBJECTIVE  No CP or  SOB.  CURRENT MEDS . amLODipine  10 mg Oral Daily  . aspirin EC  81 mg Oral Daily  . insulin aspart  0-15 Units Subcutaneous TID WC  . labetalol  100 mg Oral BID  . rosuvastatin  5 mg Oral Once per day on Mon Thu  . sodium chloride  3 mL Intravenous Q12H    OBJECTIVE  Filed Vitals:   06/17/14 1450 06/17/14 1525 06/17/14 1620 06/17/14 2053  BP: 172/75 173/74 177/78 161/73  Pulse: 52     Temp:  97.8 F (36.6 C) 97.9 F (36.6 C) 97.9 F (36.6 C)  TempSrc:  Oral Oral Oral  Resp: 20 16 18 18   Height:      Weight:      SpO2: 96% 97% 96% 96%    Intake/Output Summary (Last 24 hours) at 06/18/14 0846 Last data filed at 06/18/14 0600  Gross per 24 hour  Intake    462 ml  Output   1400 ml  Net   -938 ml   Filed Weights   06/17/14 1113  Weight: 219 lb (99.338 kg)    PHYSICAL EXAM  General: Pleasant, NAD. Neuro: Alert and oriented X 3. Moves all extremities spontaneously. Psych: Normal affect. HEENT:  Normal  Neck: Supple without bruits or JVD. Lungs:  Resp regular and unlabored, CTA. Heart: RRR no s3, s4, or murmurs. Abdomen: Soft, non-tender, non-distended, BS + x 4.  Extremities: No clubbing, cyanosis or edema. DP/PT/Radials 2+ and equal bilaterally.  Accessory Clinical Findings  CBC  Recent Labs  06/17/14 1114  WBC 6.5  HGB 12.9*  HCT 38.9*  MCV 88.6  PLT 123456   Basic Metabolic Panel  Recent Labs  06/17/14 1114 06/18/14 0359  NA 139 139  K 4.2 3.8  CL 106 105  CO2 24 27  GLUCOSE 180* 96  BUN 23 20    CREATININE 1.02 0.91  CALCIUM 9.0 9.1   Liver Function Tests No results for input(s): AST, ALT, ALKPHOS, BILITOT, PROT, ALBUMIN in the last 72 hours. No results for input(s): LIPASE, AMYLASE in the last 72 hours. Cardiac Enzymes  Recent Labs  06/17/14 1700 06/17/14 2202 06/18/14 0359  TROPONINI 2.02* 1.88* 1.82*   Fasting Lipid Panel  Recent Labs  06/18/14 0359  CHOL 140  HDL 42  LDLCALC 83  TRIG 75  CHOLHDL 3.3   Thyroid Function Tests  Recent Labs  06/17/14 1700  TSH 0.325*    TELE   Sinus brady  Radiology/Studies  Dg Chest Port 1 View  06/17/2014   CLINICAL DATA:  81 year old male with chest pain since Thursday intermittently. Worsening today.  EXAM: PORTABLE CHEST - 1 VIEW  COMPARISON:  Chest x-ray 10/23/2013.  FINDINGS: No consolidative airspace disease. No pleural effusions. No evidence of pulmonary edema. No suspicious appearing pulmonary nodules or masses. No pneumothorax. Heart size is mildly enlarged. The patient is rotated to the right on today's exam, resulting in distortion of the mediastinal contours and reduced  diagnostic sensitivity and specificity for mediastinal pathology. Tortuosity and atherosclerosis of the thoracic aorta. Status post median sternotomy for CABG.  IMPRESSION: 1. No radiographic evidence of acute cardiopulmonary disease. 2. Mild cardiomegaly. 3. Atherosclerosis.   Electronically Signed   By: Vinnie Langton M.D.   On: 06/17/2014 13:27    ASSESSMENT AND PLAN  [name] is a 82 y.o. male with a PMH of CAD s/p CABG x2(LIMA to LAD, SVG to PDA-11/2010), postop afib, HTN , DM2, HLD, OSA and Jehovahs witness who presented to [facility] on 06/17/14 with chest pain and found to have NSTEMI.  NSTEMI- Troponin 2.02--> 1.88 --> 1.82.  -- LHC today 16:30 -- ASA, beta blocker, ACE/ARB, statin, and heparin   HLD- cont Crestor.   HTN- not well controlled. HR in 50s. Continue amlodipine 10mg  and labetalol 100mg  BID. At home on Hyzaar. Will add  back losartan 100mg  today.    Judy Pimple PA-C  Pager [phone]   Patient seen and examined. Agree with assessment and plan. Will need diagnostic cath; hold losartan today in anticipation of late cath if schedule allows. Troponin trending downward. The risks and benefits of a cardiac catheterization including, but not limited to, death, stroke, MI, kidney damage and bleeding were discussed with the patient who indicates understanding and agrees to proceed.   Troy Sine, MD, [facility] 06/18/2014 9:25 AM

## 2014-06-18 NOTE — Progress Notes (Signed)
Patient is off the unit for cath procedure. Afleming. RN

## 2014-06-18 NOTE — Progress Notes (Signed)
ANTICOAGULATION CONSULT NOTE - Follow Up Consult  Pharmacy Consult for heparin Indication: NSTEMI  Labs:  Recent Labs  06/17/14 1114 06/17/14 1700 06/17/14 2202 06/18/14 0029  HGB 12.9*  --   --   --   HCT 38.9*  --   --   --   PLT 312  --   --   --   HEPARINUNFRC  --   --   --  0.50  CREATININE 1.02  --   --   --   TROPONINI  --  2.02* 1.88*  --     Assessment/Plan:  81yo male therapeutic on heparin with initial dosing for NSTEMI. Will continue gtt at current rate and confirm stable with additional level.   Wynona Neat, PharmD, BCPS  06/18/2014,1:33 AM

## 2014-06-18 NOTE — Progress Notes (Signed)
ANTICOAGULATION CONSULT NOTE - Initial Consult  Pharmacy Consult for Heparin Indication: chest pain/ACS  Allergies  Allergen Reactions  . Other Other (See Comments)    No  BLOOD PRODUCTS - Pt is Jeohovah's Witness  . Pravastatin     Myalgias    Patient Measurements: Height: 6' 1.5" (186.7 cm) Weight: 219 lb (99.338 kg) IBW/kg (Calculated) : 81.05 Heparin Dosing Weight: 99 kg  Vital Signs:    Labs:  Recent Labs  06/17/14 1114 06/17/14 1700 06/17/14 2202 06/18/14 0029 06/18/14 0359 06/18/14 0758  HGB 12.9*  --   --   --   --   --   HCT 38.9*  --   --   --   --   --   PLT 312  --   --   --   --   --   LABPROT  --   --   --   --  14.0  --   INR  --   --   --   --  1.07  --   HEPARINUNFRC  --   --   --  0.50  --  0.69  CREATININE 1.02  --   --   --  0.91  --   TROPONINI  --  2.02* 1.88*  --  1.82*  --     Estimated Creatinine Clearance: 79.6 mL/min (by C-G formula based on Cr of 0.91).  Assessment: 83 YOM on IV heparin for NSTEMI. Cath scheduled this afternoon. Heparin level 0.69 high end goal on 1400 units/hr. No new cbc this morning. No bleeding noted per chart. The patient is noted to be a Jehovah's Witness  Goal of Therapy:  Heparin level 0.3-0.7 units/ml Monitor platelets by anticoagulation protocol: Yes   Plan:  - Decrease heparin rate slightly to 1350 units/hr - f/u after cath  Maryanna Shape, PharmD, BCPS  Clinical Pharmacist  Pager: [phone]   06/18/2014 9:23 AM

## 2014-06-18 NOTE — CV Procedure (Signed)
CARDIAC CATHETERIZATION AND PERCUTANEOUS CORONARY INTERVENTION REPORT  NAME:  NGAI PARCELL   MRN: [id number] DOB:  [birth year]   ADMIT DATE: 06/17/2014 Procedure Date: 06/18/2014  INTERVENTIONAL CARDIOLOGIST: Leonie Man, M.D., MS PRIMARY CARE PROVIDER: Leonides Sake, MD PRIMARY CARDIOLOGIST: Leonie Man, MD  PATIENT:  TONATIUH MALLON is a 81 y.o. male with a PMH of CAd, CABG x2(LIMA to LAD, SVG to PDA-July 2012), postop afib, HTN , DM2, HLD, OSA. He was seen at [facility] in June 2015 to evaluate chest pain and was ruled out for MI. He had a Myoview stress test that was low risk. He then saw Dr. Gwenlyn Found for evaluation of a soft carotid bruit in the setting of dizziness and history of stroke. Carotid Dopplers revealed a mild amount of fibrous plaque without evidence of significant stenosis. His last 2-D echocardiogram was March 2014 ejection fraction was 55-60%. There was severe concentric hypertrophy. Grade 1 diastolic dysfunction. He was admitted on 1/31 for SSx of ACS & ruled in for NSTEMI. He is referred for Invasive Cardiac Evaluation with Cardiac Catheterization.  PRE-OPERATIVE DIAGNOSIS:    NSTEMI  Known CAD - s/p CABG   PROCEDURES PERFORMED:    Left Heart Catheterization with Native Coronary and Graft Angiography  via Left Radial Artery   Left Ventriculography  Percutaneous Coronary Intervention of the proximal SVG-RPDA using distal protection; Xience Alpine DES 3.0 mm x 18 mm (3.3 mm)  PROCEDURE: The patient was brought to the 2nd [place] [facility]atheter[facility] Lab in the fasting state and prepped and draped in the usual sterile fashion for left radial artery access. A modified Allen's test was performed on the left wrist demonstrating excellent collateral flow for radial access.   Sterile technique was used including antiseptics, cap, gloves, gown, hand hygiene, mask and sheet. Skin prep: Chlorhexidine.   Consent: Risks of procedure as  well as the alternatives and risks of each were explained to the (patient/caregiver). Consent for procedure obtained.   Time Out: Verified patient identification, verified procedure, site/side was marked, verified correct patient position, special equipment/implants available, medications/allergies/relevent history reviewed, required imaging and test results available. Performed.  Access:   Left Radial Artery: 6 Fr Sheath -  Seldinger Technique (Angiocath Micropuncture Kit)  Radial Cocktail - 10 mL; IV Heparin 5000 Units   Left Heart Catheterization: 5 Fr Catheters advanced or exchanged over a long exchange safety J-wire; TIG 4.0 catheter advanced first.  Left Coronary Artery Cineangiography: Attempted TIG 4.0, AL-1 -- finally used JL 5 Catheter  Right Coronary Artery Cineangiography: MPA2 Catheter   SVG-RCA Cineangiography: After multiple catheters, MPA2 Catheter   LIMA-LAD Cineangiography: IM Catheter redirected into Left Subclavian Artery  LV Hemodynamics (LV Gram): Angled Pigtail  Sheath removed in the TR band placement as well as temporary manual pressure for hemostasis. Initially after placement of the TR band the patient developed a hematoma secondary to hypertension. Manual pressure was held in the TR band was repositioned. TR Band: 1800  Hours; 14 mL air  FINDINGS:  Hemodynamics:   Central Aortic Pressure / Mean: 155/64/110 mmHg  Left Ventricular Pressure / LVEDP: 165/2/10 mmHg  Left Ventriculography:  EF: 60-65 %  Wall Motion: Mild hypokinesis of the distal inferoseptal wall  Coronary Anatomy:  Dominance: Codominant  Left Main: Normal caliber vessel that trifurcates into the LAD, Circumflex and Ramus Intermedius. Angiographically normal LAD: Normal caliber vessel with focal 70-80% near ostial stenosis just prior to the takeoff of the first diagonal branch. There  is a second diagonal branch distally and in the LAD is filled via competitive flow from LIMA.  D1& D2:  Moderate to large caliber vessels that are relatively free of disease.  Left Circumflex: Large-caliber likely codominant vessel with 2 small diffusely diseased marginal branches in the mid vessel. Distally and terminates as a large left posterior lateral branch. Mild luminal irregularities in the main channel  Ramus intermedius: Moderate caliber vessel, that courses as a high obtuse marginal. Minimal luminal irregularities.   RCA: 100% proximal occlusion.  Graft Anatomy    SVG-RPDA:  normal caliber graft that has a sharp drop after the aorta ostial attachment. There is a proximal kink in the vessel that has a focal 70-80% irregular stenosis. There is a ectatic segment at what appears to be a valve in the mid graft and and they're no significant disease distally. It attaches to the distal PDA that has diffuse disease with retrograde filling to the distal RCA.    LIMA-LAD: Widely patent normal caliber graft to the mid LAD  After reviewing the initial angiography, the culprit lesion was thought to bproximal SVG-RPDA 70-80% irregular stenosis. Preparation were made to proceed with PCI on this lesion.  Percutaneous Coronary Intervention:   Guide: 6 Fr AL 2  Guidewire: initially a pro-water wire was advanced to maintain guide catheter placement  Filter Wire 3.5-5.5 mm distal protection device was advanced into the distal graft and deployed. The holding wire was then removed  Predilation Emerge 2.5 x 12 mm;    10 Atm x  30 Sec,  Stent:Xience Alpine DES 3.0 mm x  18 mm; the stent was placed just distal to the ostium to avoid extending beyond the aorto- ostium  18Atm x 30 Sec; final diameter 3.35 mm   Post deployment angiography in multiple views, with and without guidewire in place revealed excellent stent deployment and lesion coverage.  There was no evidence of dissection or perforation.  MEDICATIONS:  Anesthesia:  Local Lidocaine 2 ml  Sedation:  1 mg IV Versed, 50 g IV fentanyl    Omnipaque Contrast: 225 ml  Anticoagulation:  IV Heparin 5000 Units ; Angiomax Bolus & drip  Anti-Platelet Agent:  Brilinta 180 mg  PATIENT DISPOSITION:    The patient was transferred to the PACU holding area in a hemodynamicaly stable, chest pain free condition.  The patient tolerated the procedure well, and there were no complications.  EBL:   < 20 ml  The patient was stable before, during, and after the procedure.  POST-OPERATIVE DIAGNOSIS:    Severe Native 2 Vessel Disease involving 100% occluded RCA and 70 IV 80% proximal LAD along with diffuse moderate to significant lesions in small obtuse marginal branches  Severe proximal disease in the SVG-RPDA treated successfully with a Xience alpine DES  Well-preserved LVEF with normal LVEDP  PLAN OF CARE:  Standard post radial cath care. I recommended that he has at least one day added to his stricture transfer his left wrist based on the mild hematoma noted post TR band placement  Adequate blood pressure control is a must.  Dual Antiplatelet Therapy for minimum of  one year. May convert to Plavix if needed  Anticipate discharge tomorrow if stable  HARDING, Leonie Green, M.D., M.S. Interventional Cardiologist   Pager # [phone]

## 2014-06-18 NOTE — Progress Notes (Signed)
Patient arrived on the unit at 1830.  At 1850 pt. Complained of nausea, general discomfort in the upper chest/throat area, and HTNsive.  Oxygen 2L [place] applied.  Pt. Repositioned.  Given zofran and IV hydralazine.  EKG done; no apparent changes.   At [place] patient reported no symptoms, and BP is improved.

## 2014-06-18 NOTE — Progress Notes (Addendum)
06/18/2014 11:00 AM Nursing note  RN noted Pt. Scheduled cath for 1630, pt. Is diabetic. Notified [facility] and received order for clear liquid breakfast then NPO afterwards. Pt. Updated on plan of care. Verbalized understanding.  Shruthi Northrup, Arville Lime

## 2014-06-18 NOTE — Interval H&P Note (Signed)
History and Physical Interval Note:  06/18/2014 3:40 PM  [name]  has presented today for surgery, with the diagnosis of shortness of breath / non-STEMI - with known CAD-CABG.  The various methods of treatment have been discussed with the patient and family. After consideration of risks, benefits and other options for treatment, the patient has consented to  Procedure(s): LEFT HEART CATHETERIZATION WITH CORONARY ANGIOGRAM (N/A) Graft Angiogram And Possible PCI as a surgical intervention .  The patient's history has been reviewed, patient examined, no change in status, stable for surgery.  I have reviewed the patient's chart and labs.  Questions were answered to the patient's satisfaction.    Cath Lab Visit (complete for each Cath Lab visit)  Clinical Evaluation Leading to the Procedure:   ACS: Yes.    Non-ACS:    Anginal Classification: CCS IV  Anti-ischemic medical therapy: Maximal Therapy (2 or more classes of medications)  Non-Invasive Test Results: No non-invasive testing performed  Prior CABG: Previous CABG   AUC for PCI:  TIMI Score  Patient Information:  TIMI Score is 5  Revascularization of the presumed culprit artery  A (9)  Indication: 11; Score: 9 TIMI Score  Patient Information:  TIMI Score is 5  Revascularization of multiple coronary arteries when the culprit artery cannot clearly be determined  A (9)  Indication: 12; Score: 9

## 2014-06-18 NOTE — Progress Notes (Signed)
UR completed 

## 2014-06-18 NOTE — Progress Notes (Signed)
   06/18/14 0900  Clinical Encounter Type  Visited With Patient;Health care provider  Visit Type Initial;Spiritual support;Social support  Referral From Nurse  Spiritual Encounters  Spiritual Needs Emotional  Stress Factors  Patient Stress Factors Exhausted   Chaplain was referred to patient via spiritual care consult. Patient was in good spirits today and was very open to talking. Patient identifies as a Jehovah Witness and has been practicing within that faith tradition for over fifty years. Patient's faith is very important to him and was the bulk of our conversation. Chaplain will continue to provide emotional and spiritual support for patient as needed. [name], Chaplain  9:56 AM

## 2014-06-19 ENCOUNTER — Encounter (HOSPITAL_COMMUNITY): Payer: Self-pay | Admitting: Cardiology

## 2014-06-19 DIAGNOSIS — Z951 Presence of aortocoronary bypass graft: Secondary | ICD-10-CM

## 2014-06-19 DIAGNOSIS — I25700 Atherosclerosis of coronary artery bypass graft(s), unspecified, with unstable angina pectoris: Secondary | ICD-10-CM

## 2014-06-19 DIAGNOSIS — E669 Obesity, unspecified: Secondary | ICD-10-CM

## 2014-06-19 DIAGNOSIS — E785 Hyperlipidemia, unspecified: Secondary | ICD-10-CM

## 2014-06-19 DIAGNOSIS — I10 Essential (primary) hypertension: Secondary | ICD-10-CM

## 2014-06-19 DIAGNOSIS — R7989 Other specified abnormal findings of blood chemistry: Secondary | ICD-10-CM

## 2014-06-19 DIAGNOSIS — I209 Angina pectoris, unspecified: Secondary | ICD-10-CM

## 2014-06-19 DIAGNOSIS — E119 Type 2 diabetes mellitus without complications: Secondary | ICD-10-CM

## 2014-06-19 LAB — BASIC METABOLIC PANEL
Anion gap: 8 (ref 5–15)
BUN: 19 mg/dL (ref 6–23)
CO2: 25 mmol/L (ref 19–32)
Calcium: 9.3 mg/dL (ref 8.4–10.5)
Chloride: 106 mmol/L (ref 96–112)
Creatinine, Ser: 1.19 mg/dL (ref 0.50–1.35)
GFR calc Af Amer: 64 mL/min — ABNORMAL LOW (ref >90)
GFR, EST NON AFRICAN AMERICAN: 55 mL/min — AB (ref >90)
GLUCOSE: 162 mg/dL — AB (ref 70–99)
Potassium: 4.2 mmol/L (ref 3.5–5.1)
Sodium: 139 mmol/L (ref 135–145)

## 2014-06-19 LAB — CBC
HEMATOCRIT: 39.4 % (ref 39.0–52.0)
HEMOGLOBIN: 13 g/dL (ref 13.0–17.0)
MCH: 29.3 pg (ref 26.0–34.0)
MCHC: 33 g/dL (ref 30.0–36.0)
MCV: 88.7 fL (ref 78.0–100.0)
PLATELETS: 334 10*3/uL (ref 150–400)
RBC: 4.44 MIL/uL (ref 4.22–5.81)
RDW: 14.8 % (ref 11.5–15.5)
WBC: 8.2 10*3/uL (ref 4.0–10.5)

## 2014-06-19 LAB — GLUCOSE, CAPILLARY: GLUCOSE-CAPILLARY: 147 mg/dL — AB (ref 70–99)

## 2014-06-19 MED ORDER — HYDROCHLOROTHIAZIDE 25 MG PO TABS
25.0000 mg | ORAL_TABLET | Freq: Every day | ORAL | Status: DC
  Filled 2014-06-19: qty 1

## 2014-06-19 MED ORDER — TICAGRELOR 90 MG PO TABS: 90.0000 mg | ORAL_TABLET | Freq: Two times a day (BID) | ORAL | Status: DC

## 2014-06-19 MED ORDER — LOSARTAN POTASSIUM-HCTZ 100-25 MG PO TABS: 1.0000 | ORAL_TABLET | Freq: Every morning | ORAL | Status: DC

## 2014-06-19 MED ORDER — LOSARTAN POTASSIUM 50 MG PO TABS
100.0000 mg | ORAL_TABLET | Freq: Every day | ORAL | Status: DC
  Filled 2014-06-19: qty 2

## 2014-06-19 MED ORDER — METFORMIN HCL ER (OSM) 1000 MG PO TB24: 1000.0000 mg | ORAL_TABLET | Freq: Two times a day (BID) | ORAL | Status: DC

## 2014-06-19 MED ORDER — ROSUVASTATIN CALCIUM 5 MG PO TABS
ORAL_TABLET | ORAL | Status: DC
Start: 2014-06-19 — End: 2014-06-27

## 2014-06-19 MED ORDER — NITROGLYCERIN 0.4 MG SL SUBL: 0.4000 mg | SUBLINGUAL_TABLET | SUBLINGUAL | Status: DC | PRN

## 2014-06-19 MED FILL — Sodium Chloride IV Soln 0.9%: INTRAVENOUS | Qty: 50 | Status: AC

## 2014-06-19 NOTE — Progress Notes (Signed)
Patient: [name] / Admit Date: 06/17/2014 / Date of Encounter: 06/19/2014, 6:34 AM   Subjective: Says he feels "fit as a fiddle." No CP or SOB.   Objective: Telemetry: NSR occasional PVC Physical Exam: Blood pressure 153/62, pulse 60, temperature 97.6 F (36.4 C), temperature source Oral, resp. rate 18, height 6' 1.5" (1.867 m), weight 224 lb 3.3 oz (101.7 kg), SpO2 98 %. General: Well developed, well nourished WM, in no acute distress. Head: Normocephalic, atraumatic, sclera non-icteric, no xanthomas, nares are without discharge. Neck: Negative for carotid bruits. JVP not elevated. Lungs: Clear bilaterally to auscultation without wheezes, rales, or rhonchi. Breathing is unlabored. Heart: RRR S1 S2 without murmurs, rubs, or gallops.  Abdomen: Soft, non-tender, non-distended with normoactive bowel sounds. No rebound/guarding. Extremities: No clubbing or cyanosis. No edema. Distal pedal pulses are 2+ and equal bilaterally. L radial site mild ecchymosis, no hematoma, good pulse. Neuro: Alert and oriented X 3. Moves all extremities spontaneously. Psych:  Responds to questions appropriately with a normal affect.   Intake/Output Summary (Last 24 hours) at 06/19/14 0634 Last data filed at 06/19/14 0330  Gross per 24 hour  Intake    915 ml  Output   2125 ml  Net  -1210 ml    Inpatient Medications:  . sodium chloride   Intravenous Once  . amLODipine  10 mg Oral Daily  . aspirin EC  81 mg Oral Daily  . insulin aspart  0-15 Units Subcutaneous TID WC  . labetalol  100 mg Oral BID  . rosuvastatin  5 mg Oral Once per day on Mon Thu  . ticagrelor  90 mg Oral BID   Infusions:    Labs:  Recent Labs  06/18/14 0359 06/19/14 0416  NA 139 139  K 3.8 4.2  CL 105 106  CO2 27 25  GLUCOSE 96 162*  BUN 20 19  CREATININE 0.91 1.19  CALCIUM 9.1 9.3   No results for input(s): AST, ALT, ALKPHOS, BILITOT, PROT, ALBUMIN in the last 72 hours.  Recent Labs  06/17/14 1114  06/19/14 0416  WBC 6.5 8.2  HGB 12.9* 13.0  HCT 38.9* 39.4  MCV 88.6 88.7  PLT 312 334    Recent Labs  06/17/14 1700 06/17/14 2202 06/18/14 0359  TROPONINI 2.02* 1.88* 1.82*   Invalid input(s): POCBNP  Recent Labs  06/17/14 1700  HGBA1C 8.0*     Radiology/Studies:  Dg Chest Port 1 View  06/17/2014   CLINICAL DATA:  81 year old male with chest pain since Thursday intermittently. Worsening today.  EXAM: PORTABLE CHEST - 1 VIEW  COMPARISON:  Chest x-ray 10/23/2013.  FINDINGS: No consolidative airspace disease. No pleural effusions. No evidence of pulmonary edema. No suspicious appearing pulmonary nodules or masses. No pneumothorax. Heart size is mildly enlarged. The patient is rotated to the right on today's exam, resulting in distortion of the mediastinal contours and reduced diagnostic sensitivity and specificity for mediastinal pathology. Tortuosity and atherosclerosis of the thoracic aorta. Status post median sternotomy for CABG.  IMPRESSION: 1. No radiographic evidence of acute cardiopulmonary disease. 2. Mild cardiomegaly. 3. Atherosclerosis.   Electronically Signed   By: Vinnie Langton M.D.   On: 06/17/2014 13:27     Assessment and Plan   1. CAD/NSTEMI this admission - history of CABG x2(LIMA to LAD, SVG to PDA-11/2010) - cath 06/18/14: Severe Native 2 Vessel Disease involving 100% occluded RCA and 70-80% proximal LAD along with diffuse moderate to significant lesions in small obtuse marginal branches, severe proximal disease  in the SVG-RPDA treated successfully with a Xience alpine DES, EF 60-65% with normal LVEDP 2. HTN 3. HLD 4. H/o post-op AF, maintaining NSR 5. DM2 uncontrolled A1C 8.0 6. OSA 7. H/o stroke ~2 yrs ago (nonhemorrhagic) 8. Obesity Body mass index is 29.18 kg/(m^2).   Doing well post cath. Continue ASA, Brilinta, labetalol. H/o myalgias on Pravastatin/Atorvastatin/Simvastatin, and also recently discontinued his Crestor due to leg cramps. Will discuss  plan for statin with MD. ?Resume Hyzaar vs just the Losartan component.  Hold metformin 48 hr post cath.  Anticipate d/c today.   Signed, Melina Copa PA-C   I have seen, examined and evaluated the patient this AM along with Ms. Idolina Primer, PA-C.  After reviewing all the available data and chart,  I agree with her findings, examination as well as impression recommendations.  Doing well this AM.  L Radial site without evidence of hematomy & minimal ecchymoses.  Restart ARB. => consider converting BP BB from Labetalol to Carvedilol.  OK for d/c.    Leonie Man, M.D., M.S. Interventional Cardiologist   Pager # [phone]

## 2014-06-19 NOTE — Progress Notes (Signed)
Pt's BP now ranging in 0000000 systolic.  No chest pain.  Will cont to monitor overnight.

## 2014-06-19 NOTE — Discharge Summary (Signed)
Discharge Summary   Patient ID: [name] MRN: [id number], DOB/AGE: 01/14/1931 81 y.o. Admit date: 06/17/2014 D/C date:     06/19/2014  Primary Care Provider: Leonides Sake, MD Primary Cardiologist: Dr. Ellyn Hack   Primary Discharge Diagnoses:  1. CAD/NSTEMI this admission - history of CABG x2(LIMA to LAD, SVG to PDA-11/2010) - cath 06/18/14: Severe Native 2 Vessel Disease involving 100% occluded RCA and 70-80% proximal LAD along with diffuse moderate to significant lesions in small obtuse marginal branches, severe proximal disease in the SVG-RPDA treated successfully with a Xience alpine DES, EF 60-65% with normal LVEDP 2. HTN 3. HLD 4. DM2 uncontrolled A1C 8.0 5. OSA 6. Abnormal TSH (0.325)  PMH:  Past Medical History  Diagnosis Date  . Non-STEMI (non-ST elevated myocardial infarction) July 2012    1. Ostial LAD 70% (to close to [facility] for PCI), subtotal occlusion of the RCA  . CAD in native artery     a. NSTEMI 11/2010 - CABG x2(LIMA to LAD, SVG to PDA-11/2010). b. NEGATIVE Lexiscan Myoview 6/9/'15, EF 53%, no perfusion abnormality, septal and apical HK noted. C. NSTEMI 05/2014 - s/p DES to SVG-RPDA 06/18/14, EF 60-65%.  . 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 Servando Snare)  . Postoperative atrial fibrillation     Post CABG, no sign recurrence  . HTN (hypertension)   . Type II diabetes mellitus with complication   . Hyperlipidemia LDL goal <70   . 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 Refusal of blood transfusions as patient is Jehovah's Witness   . Sleep apnea   . Abdominal aneurysm     mild aneursymal dilatation - 04/23/2011  . Dizziness     a. Carotid duplex 03/2014: mild fibrous plaque, no significant stenosis.  . Stroke     a. 2014 with mild right hand weakness, nonhemorrhagic per pt.    Hospital Course: Mr. [name] is an 81 y/o M with history of CAD s/p CABG x2(LIMA to LAD, SVG to PDA-July 2012), postop afib, HTN , DM2,  HLD, OSA who presented to [facility] 06/17/2014 with complaints of chest pain. He began to notice this initially with activity, relieved with rest. Due to worsening pain on the day of admission associated with some SOB, dizziness, and diaphoresis, he presented to the ED. EKG showed sinus brady, 1st degree av block, LAE, low voltage, mild ST/T changes lateral limb leads that are old. He ruled in for NSTEMI and was admitted for further management. Peak troponin 2.02. He was placed on IV heparin. His Hyzaar was held in prep for cath. He underwent cardiac cath yesterday demonstrating:  Severe Native 2 Vessel Disease involving 100% occluded RCA and 70 IV 80% proximal LAD along with diffuse moderate to significant lesions in small obtuse marginal branches  Severe proximal disease in the SVG-RPDA treated successfully with a Xience alpine DES  Well-preserved LVEF with normal LVEDP Post-procedurally he did have elevated BPs but this was felt due to holding of his Hyzaar. This was restarted on day of discharge. He was started on Brilinta with plans to continue DAPT for a minimum of 1 year. The patient feels well today. Dr. Ellyn Hack has seen and examined the patient today and feels he is stable for discharge. He was given 30 day free Brilinta rx along with regular one with refills. Metformin will be resumed 06/21/14 given late cath on 2/1 (between 48-72 hr post-cath). I  have sent a message to our [place] office's scheduler requesting a follow-up appointment (1 week TOC), and our office will call the patient with this information.  Other issues: - TSH was 0.325 this admission. Would consider repeating with free T4 at follow-up visit in 1 week. He is not on replacement therapy and has no history of hyperthyroidism. It was normal 7 months ago. Question sick euthyroid. - The patient has reported h/o myalgias on Pravastatin/Atorvastatin/Simvastatin. He had been on Crestor two days a week with LDL 83 but he reports  he recently discontinued this due to leg cramps. However, he said he started taking some vinegar in the morning and cramps disappeared thus will resume Crestor and follow how he does.  - He was advised to f/u PCP for DM given A1C 8.0.    Discharge Vitals: Blood pressure 153/62, pulse 60, temperature 97.6 F (36.4 C), temperature source Oral, resp. rate 18, height 6' 1.5" (1.867 m), weight 224 lb 3.3 oz (101.7 kg), SpO2 98 %.  Labs: Lab Results  Component Value Date   WBC 8.2 06/19/2014   HGB 13.0 06/19/2014   HCT 39.4 06/19/2014   MCV 88.7 06/19/2014   PLT 334 06/19/2014    Recent Labs Lab 06/19/14 0416  NA 139  K 4.2  CL 106  CO2 25  BUN 19  CREATININE 1.19  CALCIUM 9.3  GLUCOSE 162*    Recent Labs  06/17/14 1700 06/17/14 2202 06/18/14 0359  TROPONINI 2.02* 1.88* 1.82*   Lab Results  Component Value Date   CHOL 140 06/18/2014   HDL 42 06/18/2014   LDLCALC 83 06/18/2014   TRIG 75 06/18/2014     Diagnostic Studies/Procedures   Dg Chest Port 1 View  06/17/2014   CLINICAL DATA:  81 year old male with chest pain since Thursday intermittently. Worsening today.  EXAM: PORTABLE CHEST - 1 VIEW  COMPARISON:  Chest x-ray 10/23/2013.  FINDINGS: No consolidative airspace disease. No pleural effusions. No evidence of pulmonary edema. No suspicious appearing pulmonary nodules or masses. No pneumothorax. Heart size is mildly enlarged. The patient is rotated to the right on today's exam, resulting in distortion of the mediastinal contours and reduced diagnostic sensitivity and specificity for mediastinal pathology. Tortuosity and atherosclerosis of the thoracic aorta. Status post median sternotomy for CABG.  IMPRESSION: 1. No radiographic evidence of acute cardiopulmonary disease. 2. Mild cardiomegaly. 3. Atherosclerosis.   Electronically Signed   By: Vinnie Langton M.D.   On: 06/17/2014 13:27   Cath 06/18/14 NAME: [name]  [id number]:[id number] DOB: 02/13/1932ADMIT DATE: 06/17/2014 Procedure Date: 06/18/2014  INTERVENTIONAL CARDIOLOGIST: Leonie Man, M.D., MS PRIMARY CARE PROVIDER: Leonides Sake, MD PRIMARY CARDIOLOGIST: Leonie Man, MD  PATIENT: [name] is a 81 y.o. male with a PMH of CAd, CABG x2(LIMA to LAD, SVG to PDA-July 2012), postop afib, HTN , DM2, HLD, OSA. He was seen at [facility] in June 2015 to evaluate chest pain and was ruled out for MI. He had a Myoview stress test that was low risk. He then saw Dr. Gwenlyn Found for evaluation of a soft carotid bruit in the setting of dizziness and history of stroke. Carotid Dopplers revealed a mild amount of fibrous plaque without evidence of significant stenosis. His last 2-D echocardiogram was March 2014 ejection fraction was 55-60%. There was severe concentric hypertrophy. Grade 1 diastolic dysfunction. He was admitted on 1/31 for SSx of ACS & ruled in for NSTEMI. He is referred for Invasive Cardiac Evaluation with Cardiac Catheterization.  PRE-OPERATIVE DIAGNOSIS:   NSTEMI  Known CAD - s/p CABG  PROCEDURES PERFORMED:   Left Heart Catheterization with Native Coronary and Graft Angiography via Left Radial Artery   Left Ventriculography  Percutaneous Coronary Intervention of the proximal SVG-RPDA using distal protection; Xience Alpine DES 3.0 mm x 18 mm (3.3 mm)  PROCEDURE: The patient was brought to the 2nd Floor [facility] in the fasting state and prepped and draped in the usual sterile fashion for left radial artery access. A modified Allen's test was performed on the left wrist demonstrating excellent collateral flow for radial access. Sterile technique was used including antiseptics, cap, gloves, gown, hand hygiene, mask and sheet. Skin prep: Chlorhexidine.   Consent: Risks of procedure as well as the alternatives and  risks of each were explained to the (patient/caregiver). Consent for procedure obtained.   Time Out: Verified patient identification, verified procedure, site/side was marked, verified correct patient position, special equipment/implants available, medications/allergies/relevent history reviewed, required imaging and test results available. Performed.  Access:   Left Radial Artery: 6 Fr Sheath - Seldinger Technique (Angiocath Micropuncture Kit)  Radial Cocktail - 10 mL; IV Heparin 5000 Units    Left Heart Catheterization: 5 Fr Catheters advanced or exchanged over a long exchange safety J-wire; TIG 4.0 catheter advanced first.   Left Coronary Artery Cineangiography: Attempted TIG 4.0, AL-1 -- finally used JL 5 Catheter   Right Coronary Artery Cineangiography: MPA2 Catheter   SVG-RCA Cineangiography: After multiple catheters, MPA2 Catheter   LIMA-LAD Cineangiography: IM Catheter redirected into Left Subclavian Artery  LV Hemodynamics (LV Gram): Angled Pigtail  Sheath removed in the TR band placement as well as temporary manual pressure for hemostasis. Initially after placement of the TR band the patient developed a hematoma secondary to hypertension. Manual pressure was held in the TR band was repositioned. TR Band: 1800 Hours; 14 mL air  FINDINGS:  Hemodynamics:   Central Aortic Pressure / Mean: 155/64/110 mmHg  Left Ventricular Pressure / LVEDP: 165/2/10 mmHg  Left Ventriculography:  EF: 60-65 %  Wall Motion: Mild hypokinesis of the distal inferoseptal wall  Coronary Anatomy:  Dominance: Codominant  Left Main: Normal caliber vessel that trifurcates into the LAD, Circumflex and Ramus Intermedius. Angiographically normal LAD: Normal caliber vessel with focal 70-80% near ostial stenosis just prior to the takeoff of the first diagonal branch. There is a second diagonal branch distally and in the LAD is filled via competitive flow from LIMA.  D1& D2:  Moderate to large caliber vessels that are relatively free of disease.  Left Circumflex: Large-caliber likely codominant vessel with 2 small diffusely diseased marginal branches in the mid vessel. Distally and terminates as a large left posterior lateral branch. Mild luminal irregularities in the main channel  Ramus intermedius: Moderate caliber vessel, that courses as a high obtuse marginal. Minimal luminal irregularities.   RCA: 100% proximal occlusion.  Graft Anatomy   SVG-RPDA: normal caliber graft that has a sharp drop after the aorta ostial attachment. There is a proximal kink in the vessel that has a focal 70-80% irregular stenosis. There is a ectatic segment at what appears to be a valve in the mid graft and and they're no significant disease distally. It attaches to the distal PDA that has diffuse disease with retrograde filling to the distal RCA.   LIMA-LAD: Widely patent normal caliber graft to the mid LAD  After reviewing the initial angiography, the culprit lesion was thought to bproximal SVG-RPDA 70-80% irregular stenosis. Preparation were made  to proceed with PCI on this lesion.  Percutaneous Coronary Intervention:  Guide: 6 Fr AL 2 Guidewire: initially a pro-water wire was advanced to maintain guide catheter placement  Filter Wire 3.5-5.5 mm distal protection device was advanced into the distal graft and deployed. The holding wire was then removed  Predilation Emerge 2.5 x 12 mm;   10 Atm x 30 Sec, Stent:Xience Alpine DES 3.0 mm x 18 mm; the stent was placed just distal to the ostium to avoid extending beyond the aorto- ostium  18Atm x 30 Sec; final diameter 3.35 mm  Post deployment angiography in multiple views, with and without guidewire in place revealed excellent stent deployment and lesion coverage. There was no evidence of dissection or perforation.  MEDICATIONS:  Anesthesia: Local Lidocaine 2 ml  Sedation: 1 mg IV Versed, 50 g IV  fentanyl   Omnipaque Contrast: 225 ml  Anticoagulation: IV Heparin 5000 Units ; Angiomax Bolus & drip  Anti-Platelet Agent: Brilinta 180 mg  PATIENT DISPOSITION:   The patient was transferred to the PACU holding area in a hemodynamicaly stable, chest pain free condition.  The patient tolerated the procedure well, and there were no complications. EBL: < 20 ml  The patient was stable before, during, and after the procedure.  POST-OPERATIVE DIAGNOSIS:   Severe Native 2 Vessel Disease involving 100% occluded RCA and 70 IV 80% proximal LAD along with diffuse moderate to significant lesions in small obtuse marginal branches  Severe proximal disease in the SVG-RPDA treated successfully with a Xience alpine DES  Well-preserved LVEF with normal LVEDP  PLAN OF CARE:  Standard post radial cath care. I recommended that he has at least one day added to his stricture transfer his left wrist based on the mild hematoma noted post TR band placement  Adequate blood pressure control is a must.  Dual Antiplatelet Therapy for minimum of one year. May convert to Plavix if needed  Anticipate discharge tomorrow if stable  HARDING, Leonie Green, M.D., M.S. Interventional Cardiologist   Pager # [phone]   Discharge Medications   Current Discharge Medication List    START taking these medications   Details  nitroGLYCERIN (NITROSTAT) 0.4 MG SL tablet Place 1 tablet (0.4 mg total) under the tongue every 5 (five) minutes as needed for chest pain (up to 3 doses). Qty: 25 tablet, Refills: 3    ticagrelor (BRILINTA) 90 MG TABS tablet Take 1 tablet (90 mg total) by mouth 2 (two) times daily. Qty: 60 tablet, Refills: 11      CONTINUE these medications which have CHANGED   Details  metformin (FORTAMET) 1000 MG (OSM) 24 hr tablet Take 1 tablet (1,000 mg total) by mouth 2 (two) times daily with a meal. Notes to Patient: Restart on Thursday morning - 06/21/14.    rosuvastatin (CRESTOR) 5 MG  tablet Take 1 tablet on Mondays and Fridays only   Associated Diagnoses: Hyperlipidemia with target LDL less than 70      CONTINUE these medications which have NOT CHANGED   Details  amLODipine (NORVASC) 10 MG tablet Take 10 mg by mouth every evening.     aspirin EC 81 MG tablet Take 81 mg by mouth every morning.    Bioflavonoid Products (SUPER C-500) TABS Take 1 tablet by mouth every evening.     glimepiride (AMARYL) 4 MG tablet Take 4 mg by mouth 2 (two) times daily with a meal.     labetalol (NORMODYNE) 200 MG tablet Take 0.5 tablets (100 mg total) by mouth  2 (two) times daily.     losartan-hydrochlorothiazide (HYZAAR) 100-25 MG per tablet Take 1 tablet by mouth every morning.         Disposition   The patient will be discharged in stable condition to home. Discharge Instructions    Diet - low sodium heart healthy    Complete by:  As directed   Diabetic Diet     Increase activity slowly    Complete by:  As directed   No driving for 1 week. No lifting over 10 lbs for 2 weeks. No sexual activity for 2 weeks. Keep procedure site clean & dry. If you notice increased pain, swelling, bleeding or pus, call/return!  You may shower, but no soaking baths/hot tubs/pools for 1 week.   You may restart your metformin Thursday morning (06/21/14).          Follow-up Information    Follow up with [facility], Leonie Green, MD.   Specialty:  Cardiology   Why:  Office will call you for your followup appointment. Call office if you have not heard back in 3 days. We will probably recheck labs at that visit since your thyroid function was mildly abnormal.   Contact information:   [place] [address] [phone]       Follow up with Leonides Sake, MD.   Specialty:  Family Medicine   Why:  Your diabetes is not at goal. A1C was 8.0 which is still too high. Please discuss your diabetes regimen with your primary doctor.   Contact information:   Dr. Daiva Eves [address] [place] [address] [phone]         Duration of Discharge Encounter: Greater than 30 minutes including physician and PA time.  Signed, Melina Copa PA-C 06/19/2014, 8:31 AM

## 2014-06-19 NOTE — Progress Notes (Signed)
Pt's BP still 0000000 systolic after hydralazine 10mg .  Pt's scheduled labetalol just given and too soon for prn order hydralazine. Pt slightly anxious about every move he makes in the bed.  No chest pain or sob. Dr. Tommi Rumps notified and orders received.

## 2014-06-19 NOTE — Care Management Note (Signed)
    Page 1 of 1   06/19/2014     10:46:46 AM CARE MANAGEMENT NOTE 06/19/2014  Patient:  [name], [name]   Account Number:  [id number]  Date Initiated:  06/19/2014  Documentation initiated by:  Jaylun Fleener  Subjective/Objective Assessment:   Pt s/p PCI on 06/18/14.  PTA, pt independent, lives with spouse.     Action/Plan:   Pt to dc on Brilinta therapy.  Brilinta booklet with free trial card given to pt.  Attempted to verify copay info, but Rx drug info not in system, and pt does not have cards with him.   Anticipated DC Date:  06/19/2014   Anticipated DC Plan:  [place]  CM consult  Medication Assistance      Choice offered to / List presented to:             Status of service:  Completed, signed off Medicare Important Message given?  NA - LOS <3 / Initial given by admissions (If response is "NO", the following Medicare IM given date fields will be blank) Date Medicare IM given:   Medicare IM given by:   Date Additional Medicare IM given:   Additional Medicare IM given by:    Discharge Disposition:  HOME/SELF CARE  Per UR Regulation:  Reviewed for med. necessity/level of care/duration of stay  If discussed at [place] of Stay Meetings, dates discussed:    Comments:

## 2014-06-19 NOTE — Progress Notes (Signed)
CARDIAC REHAB PHASE I   PRE:  Rate/Rhythm: 67 SR    BP: sitting 164/78    SaO2: 98 RA  MODE:  Ambulation: 600 ft   POST:  Rate/Rhythm: 94 SR    BP: sitting 179/67     SaO2:   Tolerated well. Feels good. Ed completed with good reception. Encouraged better DM control and ex. Pt cares for his wife and mother in law so is busy but willing to work on small change. Will ex at home instead of at [facility] due to caring for family and distance. [id number]   Darrick Meigs CES, ACSM 06/19/2014 9:57 AM

## 2014-06-19 NOTE — Progress Notes (Signed)
Dr. Ellyn Hack wanted to know if it's possible to squeeze [name] in for a PA visit on Friday since his wife is coming in same day. I sent an updated message to scheduler asking if this was possible. They will call the pt. [name] Turnbaugh PA-C

## 2014-06-21 ENCOUNTER — Telehealth: Payer: Self-pay | Admitting: Cardiology

## 2014-06-21 NOTE — Telephone Encounter (Signed)
TOC phone call, pt without pain, no questions about medications.  Feeling well He knows when his appt. Is.  He will call if he has problems or questions.

## 2014-06-26 ENCOUNTER — Encounter: Payer: Self-pay | Admitting: Physician Assistant

## 2014-06-26 DIAGNOSIS — I10 Essential (primary) hypertension: Secondary | ICD-10-CM | POA: Insufficient documentation

## 2014-06-26 DIAGNOSIS — Z8673 Personal history of transient ischemic attack (TIA), and cerebral infarction without residual deficits: Secondary | ICD-10-CM | POA: Insufficient documentation

## 2014-06-26 DIAGNOSIS — E118 Type 2 diabetes mellitus with unspecified complications: Secondary | ICD-10-CM | POA: Insufficient documentation

## 2014-06-26 DIAGNOSIS — I25119 Atherosclerotic heart disease of native coronary artery with unspecified angina pectoris: Secondary | ICD-10-CM | POA: Insufficient documentation

## 2014-06-26 DIAGNOSIS — E1169 Type 2 diabetes mellitus with other specified complication: Secondary | ICD-10-CM | POA: Insufficient documentation

## 2014-06-26 DIAGNOSIS — E785 Hyperlipidemia, unspecified: Secondary | ICD-10-CM

## 2014-06-26 NOTE — Progress Notes (Signed)
Cardiology Office Note Date:  06/27/2014  Patient ID:  [name] [birth year], MRN [id number] PCP:  Leonides Sake, MD  Cardiologist:  Ellyn Hack  Chief Complaint: follow-up of stenting  History of Present Illness: [name] is a 81 y.o. male with history of CAD s/p CABG x2(LIMA to LAD, SVG to PDA-July 2012, recent NSTEMI s/p PCI), remote postop afib, HTN , DM2, HLD, OSA, nonhemorrhagic, stroke who presents for post-hospital follow-up. He was admitted 1/31-06/19/14 with chest pain and NSTEMI. Cardiac cath showed: Marland Kitchen Severe Native 2 Vessel Disease involving 100% occluded RCA and 70 IV 80% proximal LAD along with diffuse moderate to significant lesions in small obtuse marginal branches . Severe proximal disease in the SVG-RPDA treated successfully with a Xience alpine DES . Well-preserved LVEF with normal LVEDP He was started on Brilinta with plans to continue DAPT for a minimum of 1 year. Other issues during admission included abnormal TSH 0.325, no history of hypothyroidism, normal 7 months ago, question sick euthyroid. The patient has reported h/o myalgias on Pravastatin/Atorvastatin/Simvastatin. He had been on Crestor two days a week with LDL 83 but he recently discontinued this due to leg cramps. However, he said he started taking some vinegar in the morning and cramps disappeared thus Crestor was resumed. It was intended to be twice a week as previously recommended but he began taking it daily. He has since discontinued Crestor again due to myalgias.   Overall he says he is doing well. He denies chest pain, SOB, nausea, presyncope or syncope. He has rare dizziness when he bends over and stands up but this is not worse recently. He complains of constipation. He cares for his wife as well as his 11 year old mother.  Past Medical History  Diagnosis Date  . Non-STEMI (non-ST elevated myocardial infarction) July 2012    1. Ostial LAD 70% (to close to [facility] for PCI), subtotal occlusion of  the RCA  . CAD in native artery     a. NSTEMI 11/2010 - CABG x2(LIMA to LAD, SVG to PDA-11/2010). b. NEGATIVE Lexiscan Myoview 6/9/'15, EF 53%, no perfusion abnormality, septal and apical HK noted. C. NSTEMI 05/2014 - s/p DES to SVG-RPDA 06/18/14, EF 60-65%.  . 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 Servando Snare)  . Postoperative atrial fibrillation     Post CABG, no sign recurrence  . HTN (hypertension)   . Type II diabetes mellitus with complication   . Hyperlipidemia LDL goal <70   . 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 Refusal of blood transfusions as patient is Jehov[employer]'s Witness   . Sleep apnea   . Aneurysmal dilatation     a. mild aneursymal dilation of prox abd aorta - 04/23/2011  . Dizziness     a. Carotid duplex 03/2014: mild fibrous plaque, no significant stenosis.  . Stroke     a. 2014 with mild right hand weakness, nonhemorrhagic per pt.    Past Surgical History  Procedure Laterality Date  . Cardiac catheterization  12/11/2010    Dr. Chase Picket - subsequent cath - normal LV systolic function, no renal artery stenosis, severe 2-vessel disease with subtotaled RCA prox and distal 60% lesion and complex 70% area of narrowing of ostium of LAD  . Coronary artery bypass graft  12/15/2010    X2, Dr Servando Snare; LIMA to LAD, SVG to PDA;   Marland Kitchen Cystoscopy with urethral dilatation    .  Transthoracic echocardiogram  07/28/2012    EF 55-60%, severe Conc LVH, Nl Systolic fxn, G1 DD,  Ao Sclerosis w/o stenosis; CRO PFO  . Left heart catheterization with coronary angiogram N/A 06/18/2014    Procedure: LEFT HEART CATHETERIZATION WITH CORONARY ANGIOGRAM;  Surgeon: Leonie Man, MD;  Location: [facility] CATH LAB;  Service: Cardiovascular;  Laterality: N/A;    Current Outpatient Prescriptions  Medication Sig Dispense Refill  . amLODipine (NORVASC) 10 MG tablet Take 10 mg by mouth every evening.     Marland Kitchen aspirin EC 81 MG tablet Take 81 mg by mouth every  morning.    Marland Kitchen Bioflavonoid Products (SUPER C-500) TABS Take 1 tablet by mouth every evening.     Marland Kitchen doxazosin (CARDURA) 4 MG tablet Take 1 tablet by mouth daily.    Marland Kitchen glimepiride (AMARYL) 4 MG tablet Take 4 mg by mouth 2 (two) times daily with a meal.     . labetalol (NORMODYNE) 200 MG tablet Take 0.5 tablets (100 mg total) by mouth 2 (two) times daily. 30 tablet 11  . losartan-hydrochlorothiazide (HYZAAR) 100-25 MG per tablet Take 1 tablet by mouth every morning.     . metformin (FORTAMET) 1000 MG (OSM) 24 hr tablet Take 1 tablet (1,000 mg total) by mouth 2 (two) times daily with a meal.    . nitroGLYCERIN (NITROSTAT) 0.4 MG SL tablet Place 1 tablet (0.4 mg total) under the tongue every 5 (five) minutes as needed for chest pain (up to 3 doses). 25 tablet 3  . ticagrelor (BRILINTA) 90 MG TABS tablet Take 1 tablet (90 mg total) by mouth 2 (two) times daily. 60 tablet 11   No current facility-administered medications for this visit.    Allergies:   Atorvastatin; Other; Pravastatin; and Simvastatin   Social History:  The patient  reports that he quit smoking about 57 years ago. His smoking use included Cigarettes. He has never used smokeless tobacco. He reports that he does not drink alcohol or use illicit drugs.   Family History:  The patient's family history includes Heart Problems (age of onset: 72) in his father.  ROS:  Please see the history of present illness. Otherwise, review of systems is positive for constipation.  All other systems are reviewed and otherwise negative.   PHYSICAL EXAM:  VS:  BP 150/70 mmHg  Pulse 53  Ht 6' 1.5" (1.867 m)  Wt 225 lb 4.8 oz (102.195 kg)  BMI 29.32 kg/m2 BMI: Body mass index is 29.32 kg/(m^2). Recheck BP 150/80. Well nourished, well developed WM, in no acute distress HEENT: normocephalic, atraumatic Neck: no JVD, no carotid bruits Cardiac:  normal S1, S2; RRR; no murmur Lungs:  clear to auscultation bilaterally, no wheezing, rhonchi or rales Abd:  soft, nontender, no hepatomegaly Ext: no edema, left radial site with mild resolving ecchymosis - no hematoma; good pulse Skin: warm and dry Neuro:  moves all extremities spontaneously, no focal abnormalities noted  EKG:  SB 53bpm 1st degree AVB, no acute ST-T changes  Recent Labs: 10/23/2013: Pro B Natriuretic peptide (BNP) 193.3 10/24/2013: ALT 19; Magnesium 2.0 06/17/2014: TSH 0.325* 06/19/2014: BUN 19; Creatinine 1.19; Hemoglobin 13.0; Platelets 334; Potassium 4.2; Sodium 139  06/18/2014: Cholesterol, Total 140; HDL Cholesterol by NMR 42; LDL (calc) 83; Total CHOL/HDL Ratio 3.3; Triglycerides 75; VLDL 15   Estimated Creatinine Clearance: 61.6 mL/min (by C-G formula based on Cr of 1.19).   Wt Readings from Last 3 Encounters:  06/27/14 225 lb 4.8 oz (102.195 kg)  06/19/14 224  lb 3.3 oz (101.7 kg)  03/29/14 236 lb 12.8 oz (107.412 kg)     Other studies reviewed: Additional studies/records reviewed today include: summarized above.  ASSESSMENT AND PLAN:  1. CAD s/p CABG with recent NSTEMI s/p PCI - doing well. Continue DAPT for a minimum of 1 year. Ultimate duration per Dr. Ellyn Hack. See below regarding statins. Continue BB - he has evidence of sinus bradycardia but is tolerating at present time. 2. Essential HTN - BP remains high. Already on amlodipine, losartan, HCTZ at good doses and unable to titrate BB further due to sinus bradycardia. Thyroid function was recently suppressed so before adding a new medicine I want to recheck this as this could be raising BP. He has no other signs of hyperthyroidism but it is worth rechecking. Will also check BMET. Will consider spironolactone versus hydralazine based on this result. He has rare orthostatic-type dizziness so we will have to observe for this if med changes are made. Note carotid duplex previously revealed a mild amount of fibrous plaque without evidence of significant stenosis.The patient tells me he also recently heard of 3 herbs from friends  he wants to try for lowering BP - I advised him to hold off and let us know the names of these first so we can decide if this is safe. 3. Hyperlipidemia with history of statin intolerances - he has been intolerant of atorvastatin, pravastatin, simvastatin. He had myalgias with Crestor. Initially we planned to try Zetia instead. However, the patient later told the nurse that if he only needed to take Crestor twice a week as previously recommended, that he probably would be able to tolerate this. Will give Crestor another shot - f/u lipids/LFTs at time of next appointment if he tolerates this. 4. Diabetes mellitus uncontrolled A1C 8.0 - f/u PCP. 5. Abnormal TSH - recheck TSH, free T4, T3 today. 6. Aneurysmal dilitation of abdominal aorta in 2012 - f/u recommended 2014 per study .Will order updated duplex. 7. Constipation - recommended Colace scheduled, with Dulcolax 5mg  daily PRN constipation. If this persists I told him to f/u PCP.  Disposition: F/u with Dr. Ellyn Hack in 2 months.  Current medicines are reviewed at length with the patient today.  The patient did not have any concerns regarding medicines.  Raechel Ache PA-C 06/27/2014 11:19 AM     [facility] [address], [place], [place] [address] Phone: [phone]

## 2014-06-27 ENCOUNTER — Ambulatory Visit (INDEPENDENT_AMBULATORY_CARE_PROVIDER_SITE_OTHER): Payer: Medicare Other | Admitting: Physician Assistant

## 2014-06-27 ENCOUNTER — Encounter: Payer: Self-pay | Admitting: Physician Assistant

## 2014-06-27 VITALS — BP 150/70 | HR 53 | Ht 73.5 in | Wt 225.3 lb

## 2014-06-27 DIAGNOSIS — I214 Non-ST elevation (NSTEMI) myocardial infarction: Secondary | ICD-10-CM

## 2014-06-27 DIAGNOSIS — I729 Aneurysm of unspecified site: Secondary | ICD-10-CM | POA: Diagnosis not present

## 2014-06-27 DIAGNOSIS — Z79899 Other long term (current) drug therapy: Secondary | ICD-10-CM | POA: Diagnosis not present

## 2014-06-27 DIAGNOSIS — R5383 Other fatigue: Secondary | ICD-10-CM

## 2014-06-27 DIAGNOSIS — R7989 Other specified abnormal findings of blood chemistry: Secondary | ICD-10-CM | POA: Diagnosis not present

## 2014-06-27 DIAGNOSIS — I10 Essential (primary) hypertension: Secondary | ICD-10-CM | POA: Diagnosis not present

## 2014-06-27 DIAGNOSIS — I2510 Atherosclerotic heart disease of native coronary artery without angina pectoris: Secondary | ICD-10-CM

## 2014-06-27 DIAGNOSIS — I222 Subsequent non-ST elevation (NSTEMI) myocardial infarction: Secondary | ICD-10-CM | POA: Diagnosis not present

## 2014-06-27 DIAGNOSIS — K5901 Slow transit constipation: Secondary | ICD-10-CM | POA: Diagnosis not present

## 2014-06-27 DIAGNOSIS — E785 Hyperlipidemia, unspecified: Secondary | ICD-10-CM | POA: Diagnosis not present

## 2014-06-27 DIAGNOSIS — E118 Type 2 diabetes mellitus with unspecified complications: Secondary | ICD-10-CM

## 2014-06-27 DIAGNOSIS — R799 Abnormal finding of blood chemistry, unspecified: Secondary | ICD-10-CM | POA: Diagnosis not present

## 2014-06-27 MED ORDER — EZETIMIBE 10 MG PO TABS: 10.0000 mg | ORAL_TABLET | Freq: Every day | ORAL | Status: DC

## 2014-06-27 MED ORDER — ROSUVASTATIN CALCIUM 5 MG PO TABS
5.0000 mg | ORAL_TABLET | ORAL | Status: DC
Start: 2014-06-27 — End: 2015-03-21

## 2014-06-27 MED ORDER — BISACODYL 5 MG PO TBEC: 5.0000 mg | DELAYED_RELEASE_TABLET | Freq: Every day | ORAL | Status: DC | PRN

## 2014-06-27 MED ORDER — DOCUSATE SODIUM 100 MG PO CAPS: 100.0000 mg | ORAL_CAPSULE | Freq: Two times a day (BID) | ORAL | Status: DC

## 2014-06-27 NOTE — Patient Instructions (Addendum)
Your physician recommends that you return for lab work in: TODAY at [facility].  START Crestor 5mg  twice weekly - Mondays and Fridays only.  Have lab work rechecked in 2 months before your next appointment with Dr. Ellyn Hack.  Your physician has requested that you have an abdominal aorta duplex. During this test, an ultrasound is used to evaluate the aorta. Allow 30 minutes for this exam. Do not eat after midnight the day before and avoid carbonated beverages  You can take Colace 100mg  daily and Dulcolax 5mg  daily as needed for constipation.  These are over the counter meds.  Your physician recommends that you schedule a follow-up appointment in: 2 months with Dr. Ellyn Hack.

## 2014-06-28 DIAGNOSIS — Z1389 Encounter for screening for other disorder: Secondary | ICD-10-CM | POA: Diagnosis not present

## 2014-06-28 DIAGNOSIS — N183 Chronic kidney disease, stage 3 (moderate): Secondary | ICD-10-CM | POA: Diagnosis not present

## 2014-06-28 DIAGNOSIS — E1129 Type 2 diabetes mellitus with other diabetic kidney complication: Secondary | ICD-10-CM | POA: Diagnosis not present

## 2014-06-28 DIAGNOSIS — Z125 Encounter for screening for malignant neoplasm of prostate: Secondary | ICD-10-CM | POA: Diagnosis not present

## 2014-06-28 DIAGNOSIS — Z6830 Body mass index (BMI) 30.0-30.9, adult: Secondary | ICD-10-CM | POA: Diagnosis not present

## 2014-06-28 DIAGNOSIS — E559 Vitamin D deficiency, unspecified: Secondary | ICD-10-CM | POA: Diagnosis not present

## 2014-06-28 DIAGNOSIS — I10 Essential (primary) hypertension: Secondary | ICD-10-CM | POA: Diagnosis not present

## 2014-06-28 DIAGNOSIS — E780 Pure hypercholesterolemia: Secondary | ICD-10-CM | POA: Diagnosis not present

## 2014-06-29 DIAGNOSIS — I2510 Atherosclerotic heart disease of native coronary artery without angina pectoris: Secondary | ICD-10-CM | POA: Diagnosis not present

## 2014-06-29 DIAGNOSIS — Z79899 Other long term (current) drug therapy: Secondary | ICD-10-CM | POA: Diagnosis not present

## 2014-06-29 DIAGNOSIS — E785 Hyperlipidemia, unspecified: Secondary | ICD-10-CM | POA: Diagnosis not present

## 2014-06-29 LAB — COMPREHENSIVE METABOLIC PANEL
ALT: 17 U/L (ref 0–53)
AST: 18 U/L (ref 0–37)
Albumin: 4.3 g/dL (ref 3.5–5.2)
Alkaline Phosphatase: 63 U/L (ref 39–117)
BUN: 28 mg/dL — AB (ref 6–23)
CALCIUM: 9.2 mg/dL (ref 8.4–10.5)
CHLORIDE: 105 meq/L (ref 96–112)
CO2: 22 meq/L (ref 19–32)
Creat: 1.28 mg/dL (ref 0.50–1.35)
Glucose, Bld: 155 mg/dL — ABNORMAL HIGH (ref 70–99)
Potassium: 5 mEq/L (ref 3.5–5.3)
SODIUM: 141 meq/L (ref 135–145)
Total Bilirubin: 0.6 mg/dL (ref 0.2–1.2)
Total Protein: 6.3 g/dL (ref 6.0–8.3)

## 2014-06-29 LAB — LIPID PANEL
CHOLESTEROL: 131 mg/dL (ref 0–200)
HDL: 48 mg/dL (ref >39)
LDL CALC: 69 mg/dL (ref 0–99)
TRIGLYCERIDES: 71 mg/dL (ref <150)
Total CHOL/HDL Ratio: 2.7 Ratio
VLDL: 14 mg/dL (ref 0–40)

## 2014-06-29 LAB — T3, FREE: T3, Free: 2.8 pg/mL (ref 2.3–4.2)

## 2014-06-29 LAB — TSH: TSH: 2.651 u[IU]/mL (ref 0.350–4.500)

## 2014-06-29 LAB — T4, FREE: Free T4: 1.25 ng/dL (ref 0.80–1.80)

## 2014-07-02 ENCOUNTER — Telehealth: Payer: Self-pay | Admitting: Cardiology

## 2014-07-02 NOTE — Telephone Encounter (Signed)
Forward to [place]

## 2014-07-02 NOTE — Telephone Encounter (Signed)
Calling to get his lab results . Please call .Thanks

## 2014-07-02 NOTE — Telephone Encounter (Signed)
  Called patient back with lab results. Per Melina Copa PA, tabs are stable and thyroid function was normal on recheck. Patient encourage to drink adequate amount of water, but not to over do it. Patient verbalized understanding.

## 2014-07-04 ENCOUNTER — Other Ambulatory Visit (INDEPENDENT_AMBULATORY_CARE_PROVIDER_SITE_OTHER): Payer: Medicare Other | Admitting: *Deleted

## 2014-07-04 ENCOUNTER — Telehealth: Payer: Self-pay | Admitting: *Deleted

## 2014-07-04 DIAGNOSIS — I10 Essential (primary) hypertension: Secondary | ICD-10-CM

## 2014-07-04 MED ORDER — HYDRALAZINE HCL 25 MG PO TABS
25.0000 mg | ORAL_TABLET | Freq: Three times a day (TID) | ORAL | Status: DC
Start: 2014-07-04 — End: 2014-07-12

## 2014-07-04 NOTE — Telephone Encounter (Signed)
pt notified per Melina Copa, PA  and Dr. Ellyn Hack to start hydralazine 25 mg TID, Rx sent in today, BP check 2/25 9 am. Pt advised to monitor bp and call in a couple of days w/readings, pt said ok and thank you.

## 2014-07-04 NOTE — Telephone Encounter (Signed)
Spoke to patient. Result given . Verbalized understanding  

## 2014-07-04 NOTE — Telephone Encounter (Signed)
-----   Message from Leonie Man, MD sent at 07/03/2014 11:17 PM EST ----- Excellent lipids. Stay the course. DH.

## 2014-07-10 ENCOUNTER — Ambulatory Visit (HOSPITAL_COMMUNITY)
Admission: RE | Admit: 2014-07-10 | Discharge: 2014-07-10 | Disposition: A | Discharge status: Home / Self Care | Payer: Medicare Other | Source: Ambulatory Visit | Attending: Cardiovascular Disease | Admitting: Cardiovascular Disease

## 2014-07-10 DIAGNOSIS — I729 Aneurysm of unspecified site: Secondary | ICD-10-CM | POA: Diagnosis not present

## 2014-07-10 NOTE — Progress Notes (Signed)
Abdominal Aortic Duplex Completed °Brianna L Mazza,RVT °

## 2014-07-12 ENCOUNTER — Ambulatory Visit: Payer: Medicare Other

## 2014-07-12 VITALS — BP 162/90 | HR 58 | Resp 16 | Wt 227.0 lb

## 2014-07-12 DIAGNOSIS — I10 Essential (primary) hypertension: Secondary | ICD-10-CM

## 2014-07-12 MED ORDER — HYDRALAZINE HCL 25 MG PO TABS: 25.0000 mg | ORAL_TABLET | Freq: Two times a day (BID) | ORAL | Status: DC

## 2014-07-12 MED ORDER — HYDRALAZINE HCL 50 MG PO TABS: 50.0000 mg | ORAL_TABLET | Freq: Two times a day (BID) | ORAL | Status: DC

## 2014-07-12 NOTE — Progress Notes (Signed)
1.) Reason for visit: BP check, patient started on Hydralazine 25 mg TID on 07/04/14.  2.) Name of MD requesting visit: Dayna Dunn PA  3.) H&P: Patient has a history of NSTEMI, CAD, CABG, HTN, DM, Dizziness, and Stroke. Patient has been keeping records of blood pressures, with SBP ranging from 140s to 160s. Patient's BP today 162/90, HR 58, Resp. 16 and O2 99% on room air. Patient has not been compliant with taking new medication Hydralazine 25 mg three times a day, instead he has been taking twice daily.  4.) ROS related to problem: Patient is alert and oriented and appears to be relaxed and up beat.  Patient has been keeping records of blood pressures, with SBP ranging from 140s to 160s. Patient's BP today 162/90, HR 58, Resp. 16 and O2 99% on room air. Patient has not been compliant with taking new medication Hydralazine 25 mg three times a day, instead he has been taking twice daily. Patient denies any chest pain at the time, but patient stated he has slight chest pain sometimes while doing house chores (vaccuming, cooking, etc. ), not all the time. When this happens the patient will stop and sit down and chest pain stops. Patient does have nitroglycerin that he carries with him at all times. Patient states he feels fine at this time.  5.) Assessment and plan per MD: Per Tarri Fuller PA (FLEX), patient's hydralazine will be increased to 75 mg by mouth twice daily. Patient is to record BPs and call the office if BP is too low and patient is symptomatic.

## 2014-07-12 NOTE — Patient Instructions (Signed)
Your physician has recommended you make the following change in your medication:   Increased Hydralazine to 75 mg by mouth twice daily.  Please keep a record of your BP and call the office if you have a SBP lower than 100, not feeling well, and dizzy.

## 2014-07-17 ENCOUNTER — Encounter: Payer: Self-pay | Admitting: Physician Assistant

## 2014-07-17 DIAGNOSIS — I714 Abdominal aortic aneurysm, without rupture, unspecified: Secondary | ICD-10-CM | POA: Insufficient documentation

## 2014-07-19 ENCOUNTER — Telehealth: Payer: Self-pay | Admitting: *Deleted

## 2014-07-19 NOTE — Telephone Encounter (Signed)
pt's wife notified about AAA results similar to 2012 and will recheck in 2018. I also advised to make sure pt fills out at next appt a DPR form. I explained to her what this was.

## 2014-07-31 ENCOUNTER — Telehealth: Payer: Self-pay | Admitting: *Deleted

## 2014-07-31 NOTE — Telephone Encounter (Signed)
Patient stopped by office -would like prescription for brilinta to be called into the pharmacy. Called  Pharmacy to see if prescription of brilinta available for patient Medication is available.  notified wife medication is ready.

## 2014-08-30 ENCOUNTER — Telehealth: Payer: Self-pay | Admitting: *Deleted

## 2014-08-30 DIAGNOSIS — I10 Essential (primary) hypertension: Secondary | ICD-10-CM

## 2014-08-30 NOTE — Telephone Encounter (Signed)
Letter and lab slip was not mailed. patient had lipid and hepatic done in 06/2014 not needed so soon.

## 2014-08-30 NOTE — Telephone Encounter (Signed)
-----   Message from Raiford Simmonds, RN sent at 03/29/2014  4:58 PM EST ----- LABS CMP,LIPIDS MAY 2016 MAIL IN April 2016

## 2014-09-13 ENCOUNTER — Ambulatory Visit (INDEPENDENT_AMBULATORY_CARE_PROVIDER_SITE_OTHER): Payer: Medicare Other | Admitting: Cardiology

## 2014-09-13 ENCOUNTER — Encounter: Payer: Self-pay | Admitting: Cardiology

## 2014-09-13 VITALS — BP 158/70 | HR 78 | Ht 74.0 in | Wt 224.4 lb

## 2014-09-13 DIAGNOSIS — Z9861 Coronary angioplasty status: Secondary | ICD-10-CM

## 2014-09-13 DIAGNOSIS — E785 Hyperlipidemia, unspecified: Secondary | ICD-10-CM

## 2014-09-13 DIAGNOSIS — Z79899 Other long term (current) drug therapy: Secondary | ICD-10-CM

## 2014-09-13 DIAGNOSIS — R42 Dizziness and giddiness: Secondary | ICD-10-CM | POA: Diagnosis not present

## 2014-09-13 DIAGNOSIS — R0602 Shortness of breath: Secondary | ICD-10-CM

## 2014-09-13 DIAGNOSIS — I25118 Atherosclerotic heart disease of native coronary artery with other forms of angina pectoris: Secondary | ICD-10-CM

## 2014-09-13 DIAGNOSIS — I10 Essential (primary) hypertension: Secondary | ICD-10-CM

## 2014-09-13 DIAGNOSIS — I714 Abdominal aortic aneurysm, without rupture, unspecified: Secondary | ICD-10-CM

## 2014-09-13 DIAGNOSIS — I2510 Atherosclerotic heart disease of native coronary artery without angina pectoris: Secondary | ICD-10-CM

## 2014-09-13 DIAGNOSIS — E669 Obesity, unspecified: Secondary | ICD-10-CM

## 2014-09-13 DIAGNOSIS — I25708 Atherosclerosis of coronary artery bypass graft(s), unspecified, with other forms of angina pectoris: Secondary | ICD-10-CM

## 2014-09-13 NOTE — Patient Instructions (Signed)
When you take Brilinta -take with coffee or some coke ( some caffeine)  Take aspirin only once a day.,if you still have bruising after may you can stop the aspirin  Take your morning medication after you eat breakfast. Take evening mediations 1 hour before eating your evening meals.  Need labs - in 3 month lipids - will mail you lab slip in 1 1-2 months  Your physician wants you to follow-up in 3 -4 months Dr Ellyn Hack.  You will receive a reminder letter in the mail two months in advance. If you don't receive a letter, please call our office to schedule the follow-up appointment.

## 2014-09-13 NOTE — Progress Notes (Signed)
PCP: [name] Sake, MD  Clinic Note: Chief Complaint  Patient presents with  . Follow-up    losartan gets him dizzy in the am, brilinta causes shortness of breath --he has to stop.concern of to much medication  . Medication Problem   HPI: [name] is a 81 y.o. male with a PMH below who presents today for 2nd Post-hospital f/u. He has a history of CAD s/p CABG x2(LIMA to LAD, SVG to PDA-July 2012, recent NSTEMI s/p PCI), remote postop afib, HTN , DM2, HLD, OSA, nonhemorrhagic, stroke who presents for post-hospital follow-up. He was admitted 1/31-06/19/14 with chest pain and NSTEMI. Cardiac cath showed: Marland Kitchen Severe Native 2 Vessel Disease involving 100% occluded RCA and 70 - 80% proximal LAD along with diffuse moderate to significant lesions in small obtuse marginal branches . Patent LIMA-LAD . Severe proximal disease in the SVG-RPDA treated successfully with a Xience alpine DES 3.0 mm x 18 mm (3.35) . Well-preserved LVEF with normal LVEDP  His initial followup was on 06/27/14 with [name] Copa, PA-C -- he stated he was doing relatively well at that time without any further chest pain or dyspnea. He continued to have rare occasional dizziness is positional. He notes constipation and also GI upset. He has an occasional dyspneic spells with Brilinta at that time but did not mention it. Somehow he got the medicines to take aspirin twice a day.  Past Medical History  Diagnosis Date  . Non-STEMI (non-ST elevated myocardial infarction) July 2012    1. Ostial LAD 70% (to close to [facility] for PCI), subtotal occlusion of the RCA  . CAD in native artery     a. NSTEMI 11/2010 - CABG x2(LIMA to LAD, SVG to PDA-11/2010). b. NEGATIVE Lexiscan Myoview 6/9/'15, EF 53%, no perfusion abnormality, septal and apical HK noted. C. NSTEMI 05/2014 - s/p DES to SVG-RPDA 06/18/14, EF 60-65%.  . 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 Servando [name])  . Postoperative atrial fibrillation     Post CABG, no sign  recurrence  . HTN (hypertension)   . Type II diabetes mellitus with complication   . Hyperlipidemia LDL goal <70   . 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 Refusal of blood transfusions as patient is Jehovah's Witness   . Sleep apnea   . Abdominal aortic aneurysm     a. Aortic duplex 06/2014: mild aneurysmal dilatation of proximal abdominal aorta measuring 3.4x3.4cm. No sig change from 2012. F/u due 06/2016.  . Dizziness     a. Carotid duplex 03/2014: mild fibrous plaque, no significant stenosis.  . Stroke     a. 2014 with mild right hand weakness, nonhemorrhagic per pt.   Interval History:  [name] seems to be doing relatively well from a cardiac standpoint. The major thing that he complains about is that he just gets tired relatively quickly. Over last month or so this is an more pronounced at the actually notices it.  Initially after his MI, he tired out faster and was having taken now after eating his wife and mother-in-law breakfast. He has not had any recurrence of angina or dyspnea at rest or exertion unless he pushed himself to get somewhat dyspneic. No significant heart failure symptoms of PND, orthopnea or significant edema.  Cardiovascular ROS: no chest pain or dyspnea on exertion positive for - Dizziness, and shortness of breath episodes from Brilinta but nonexertional unless significant exertion negative for -  chest pain, edema, irregular heartbeat, loss of consciousness, murmur, orthopnea, palpitations, paroxysmal nocturnal dyspnea, rapid heart rate or Syncope/near syncope, TIA/amaurosis fugax  ROS: A comprehensive was performed. Review of Systems  Constitutional: Positive for malaise/fatigue (Tires out quickly).  Respiratory: Negative for cough.   Cardiovascular: Negative.  Negative for claudication.       Per history of present illness  Gastrointestinal: Negative for blood in stool and melena.  Genitourinary: Negative for hematuria.    Musculoskeletal: Positive for joint pain. Negative for myalgias.  Neurological: Negative for headaches.  Endo/Heme/Allergies: Does not bruise/bleed easily.  All other systems reviewed and are negative.   Current Outpatient Prescriptions on File Prior to Visit  Medication Sig Dispense Refill  . amLODipine (NORVASC) 10 MG tablet Take 10 mg by mouth every evening.     Marland Kitchen aspirin EC 81 MG tablet Take 81 mg by mouth every morning.    Marland Kitchen Bioflavonoid Products (SUPER C-500) TABS Take 1 tablet by mouth every evening.     . bisacodyl (DULCOLAX) 5 MG EC tablet Take 1 tablet (5 mg total) by mouth daily as needed for moderate constipation. 30 tablet 0  . docusate sodium (COLACE) 100 MG capsule Take 1 capsule (100 mg total) by mouth 2 (two) times daily. 10 capsule 0  . doxazosin (CARDURA) 4 MG tablet Take 1 tablet by mouth daily.    Marland Kitchen glimepiride (AMARYL) 4 MG tablet Take 4 mg by mouth 2 (two) times daily with a meal.     . hydrALAZINE (APRESOLINE) 25 MG tablet Take 1 tablet (25 mg total) by mouth 2 (two) times daily. Patient needs to take with Hydralazine 50 mg to equal 75 mg 90 tablet 6  . hydrALAZINE (APRESOLINE) 50 MG tablet Take 1 tablet (50 mg total) by mouth 2 (two) times daily. Patient needs to take with hydralazine 25 mg to equal 75 mg 180 tablet 3  . labetalol (NORMODYNE) 200 MG tablet Take 0.5 tablets (100 mg total) by mouth 2 (two) times daily. 30 tablet 11  . losartan-hydrochlorothiazide (HYZAAR) 100-25 MG per tablet Take 1 tablet by mouth every morning.     . nitroGLYCERIN (NITROSTAT) 0.4 MG SL tablet Place 1 tablet (0.4 mg total) under the tongue every 5 (five) minutes as needed for chest pain (up to 3 doses). 25 tablet 3  . rosuvastatin (CRESTOR) 5 MG tablet Take 1 tablet (5 mg total) by mouth 2 (two) times a week. 10 tablet 5  . ticagrelor (BRILINTA) 90 MG TABS tablet Take 1 tablet (90 mg total) by mouth 2 (two) times daily. 60 tablet 11   No current facility-administered medications on  file prior to visit.   Allergies  Allergen Reactions  . Atorvastatin     Myalgias  . Crestor [Rosuvastatin]   . Other Other (See Comments)    No  BLOOD PRODUCTS - Pt is Jeohovah's Witness  . Pravastatin     Myalgias  . Simvastatin     Myalgias    Family History  Problem Relation Age of Onset  . Heart Problems Father 53   History  Substance Use Topics  . Smoking status: Former Smoker    Types: Cigarettes    Quit date: 05/27/1957  . Smokeless tobacco: Never Used  . Alcohol Use: No    Wt Readings from Last 3 Encounters:  09/13/14 101.787 kg (224 lb 6.4 oz)  07/12/14 102.967 kg (227 lb)  06/27/14 102.195 kg (225 lb 4.8 oz)   PHYSICAL EXAM BP 158/70 mmHg  Pulse 78  Ht 6\' 2"  (1.88 m)  Wt 101.787 kg (224 lb 6.4 oz)  BMI 28.80 kg/m2 General appearance: alert, cooperative, appears stated age, no distress and mildly obese Neck: no adenopathy, no carotid bruit and no JVD Lungs: clear to auscultation bilaterally, normal percussion bilaterally and non-labored Heart: regular rate and rhythm, S1, S2 normal, soft 1/6 SEM at RUSB radiating to carotids, click, rub or gallop ; nondisplaced PMI Abdomen: soft, non-tender; bowel sounds normal; no masses, no organomegaly;  Extremities: extremities normal, atraumatic, no cyanosis, and ~2+ bilateral LE edema Pulses: 2+ and symmetric;  Neurologic: Mental status: Alert, oriented, thought content appropriate   Adult ECG Report  Rate: 48 ;  Rhythm: sinus bradycardia and 1 AVB  Narrative Interpretation: otherwise normal EKG  Recent Labs:    Lab Results  Component Value Date   CHOL 131 06/27/2014   HDL 48 06/27/2014   LDLCALC 69 06/27/2014   TRIG 71 06/27/2014   CHOLHDL 2.7 06/27/2014     Chemistry      Component Value Date/Time   NA 141 06/27/2014 1316   K 5.0 06/27/2014 1316   CL 105 06/27/2014 1316   CO2 22 06/27/2014 1316   BUN 28* 06/27/2014 1316   CREATININE 1.28 06/27/2014 1316   CREATININE 1.19 06/19/2014 0416        Component Value Date/Time   CALCIUM 9.2 06/27/2014 1316   ALKPHOS 63 06/27/2014 1316   AST 18 06/27/2014 1316   ALT 17 06/27/2014 1316   BILITOT 0.6 06/27/2014 1316      ASSESSMENT / PLAN: Problem List Items Addressed This Visit    Abdominal aortic aneurysm (Chronic)    Recent duplex showed mild aneurysmal dilation but no significant change in 2012. Followup in 2018.      CAD S/P percutaneous coronary angioplasty (Chronic)    Recent DES stent. Now on dual antiplatelet therapy with aspirin plus Brilinta.  He is complaining of gasping for breath symptoms from Brilinta -- I would like for him to stay on Brilinta as long as possible. If his symptoms persist despite recommendations we can switch him to Plavix. Reduce aspirin to once a day 81 mg.      Coronary artery disease involving coronary bypass graft of native heart with other forms of angina pectoris (Chronic)    Now has a stent in his vein graft to the PDA. His PDA was open. He will eventually need followup stress testing probably would do this sooner than would usually be the plan. Potentially in 2 years, provided he has no further symptoms.      Coronary artery disease involving native coronary artery - Primary (Chronic)    Two-vessel disease of the native coronary arteries with tube graft. One graft now is status post stent. No recurrent angina since his recent non-STEMI.  He is on a beta blocker (labetalol) which I thought in the past that have been changed to carvedilol, but for some reason he is back on labetalol along with the ARB/HCTZ combination.Marland Kitchen He is also trying to take a statin. On amlodipine for additional antianginal effect.       Dizziness    Sometimes in the morning. I wonder if this is because he taking all his blood pressure medications together. We discussed spacing out medications on. For this reason we are allowing mild permissive hypertension.      Hyperlipidemia LDL goal <70 (Chronic)    I have  asked that he try taking a statin more frequently. His  labs are pretty well controlled with LDL of 69 from February.  His HDL is actually relatively well controlled. If he is able tolerate taking Crestor or 3 times at the week, this would be more likely to maintain stability.      Moderate essential hypertension (Chronic)    Blood pressures are still elevated today. We're allowing some permissive hypertension because of his dizziness. Again there were passed to be a reason for him being on labetalol and not carvedilol which is probably a better cardiac medication for blood pressure. For better control could also consider using a stronger ARB.  With his history of carotid disease and TIAs however I think target blood pressure of around 140 mmHg acceptable. Pending further evaluation, we'll hold off on making changes now. With his dizziness, I think I want to alternate when he takes medications when he takes his morning medications after breakfast and his evening medications before his evening meals.      Obesity (Chronic)   Relevant Medications   metFORMIN (GLUCOPHAGE) 1000 MG tablet   Polypharmacy    Routine surveillance labs -CMP & lipids      Shortness of breath    His dyspnea spells are probably related to Brilinta. The recommendation is that there has been good and total evidence of taking Brilinta after the morning cup of coffee or other caffeinated beverage. This tends to help that symptom. I'd like to give this a try. If it is not successful, we can consider switching to Plavix.          Patient Instructions: When you take Brilinta -take with coffee or some coke ( some caffeine)  Take aspirin only once a day.,if you still have bruising after may you can stop the aspirin  Take your morning medication after you eat breakfast. Take evening mediations 1 hour before eating your evening meals.  Need labs - in 3 month lipids - will mail you lab slip in 1 1-2 months   Followup: 6  months   Maylin Freeburg, Leonie Green, M.D., M.S. Interventional Cardiologist   Pager # [phone]

## 2014-09-19 DIAGNOSIS — Z9861 Coronary angioplasty status: Secondary | ICD-10-CM

## 2014-09-19 DIAGNOSIS — Z79899 Other long term (current) drug therapy: Secondary | ICD-10-CM

## 2014-09-19 DIAGNOSIS — I2510 Atherosclerotic heart disease of native coronary artery without angina pectoris: Secondary | ICD-10-CM | POA: Insufficient documentation

## 2014-09-19 DIAGNOSIS — R0602 Shortness of breath: Secondary | ICD-10-CM | POA: Insufficient documentation

## 2014-09-19 DIAGNOSIS — I25709 Atherosclerosis of coronary artery bypass graft(s), unspecified, with unspecified angina pectoris: Secondary | ICD-10-CM

## 2014-09-19 HISTORY — DX: Atherosclerosis of coronary artery bypass graft(s), unspecified, with unspecified angina pectoris: I25.709

## 2014-09-19 HISTORY — DX: Other long term (current) drug therapy: Z79.899

## 2014-09-19 NOTE — Assessment & Plan Note (Addendum)
Recent DES stent. Now on dual antiplatelet therapy with aspirin plus Brilinta.  He is complaining of gasping for breath symptoms from Brilinta -- I would like for him to stay on Brilinta as long as possible. If his symptoms persist despite recommendations we can switch him to Plavix. Reduce aspirin to once a day 81 mg.

## 2014-09-19 NOTE — Assessment & Plan Note (Signed)
Two-vessel disease of the native coronary arteries with tube graft. One graft now is status post stent. No recurrent angina since his recent non-STEMI.  He is on a beta blocker (labetalol) which I thought in the past that have been changed to carvedilol, but for some reason he is back on labetalol along with the ARB/HCTZ combination.[name] He is also trying to take a statin. On amlodipine for additional antianginal effect.

## 2014-09-19 NOTE — Assessment & Plan Note (Signed)
Blood pressures are still elevated today. We're allowing some permissive hypertension because of his dizziness. Again there were passed to be a reason for him being on labetalol and not carvedilol which is probably a better cardiac medication for blood pressure. For better control could also consider using a stronger ARB.  With his history of carotid disease and TIAs however I think target blood pressure of around 140 mmHg acceptable. Pending further evaluation, we'll hold off on making changes now. With his dizziness, I think I want to alternate when he takes medications when he takes his morning medications after breakfast and his evening medications before his evening meals.

## 2014-09-19 NOTE — Assessment & Plan Note (Signed)
His dyspnea spells are probably related to Brilinta. The recommendation is that there has been good and total evidence of taking Brilinta after the morning cup of coffee or other caffeinated beverage. This tends to help that symptom. I'd like to give this a try. If it is not successful, we can consider switching to Plavix.

## 2014-09-19 NOTE — Assessment & Plan Note (Signed)
Routine surveillance labs -CMP & lipids

## 2014-09-19 NOTE — Assessment & Plan Note (Signed)
Sometimes in the morning. I wonder if this is because he taking all his blood pressure medications together. We discussed spacing out medications on. For this reason we are allowing mild permissive hypertension.

## 2014-09-19 NOTE — Assessment & Plan Note (Signed)
Now has a stent in his vein graft to the PDA. His PDA was open. He will eventually need followup stress testing probably would do this sooner than would usually be the plan. Potentially in 2 years, provided he has no further symptoms.

## 2014-09-19 NOTE — Assessment & Plan Note (Signed)
I have asked that he try taking a statin more frequently. His labs are pretty well controlled with LDL of 69 from February.  His HDL is actually relatively well controlled. If he is able tolerate taking Crestor or 3 times at the week, this would be more likely to maintain stability.

## 2014-09-19 NOTE — Assessment & Plan Note (Signed)
Recent duplex showed mild aneurysmal dilation but no significant change in 2012. Followup in 2018.

## 2014-11-01 DIAGNOSIS — E1129 Type 2 diabetes mellitus with other diabetic kidney complication: Secondary | ICD-10-CM | POA: Diagnosis not present

## 2014-11-01 DIAGNOSIS — Z79899 Other long term (current) drug therapy: Secondary | ICD-10-CM | POA: Diagnosis not present

## 2014-11-01 DIAGNOSIS — E780 Pure hypercholesterolemia: Secondary | ICD-10-CM | POA: Diagnosis not present

## 2014-11-06 DIAGNOSIS — Z85828 Personal history of other malignant neoplasm of skin: Secondary | ICD-10-CM | POA: Diagnosis not present

## 2014-11-06 DIAGNOSIS — L570 Actinic keratosis: Secondary | ICD-10-CM | POA: Diagnosis not present

## 2014-11-06 DIAGNOSIS — D225 Melanocytic nevi of trunk: Secondary | ICD-10-CM | POA: Diagnosis not present

## 2014-11-09 DIAGNOSIS — E780 Pure hypercholesterolemia: Secondary | ICD-10-CM | POA: Diagnosis not present

## 2014-11-09 DIAGNOSIS — E1129 Type 2 diabetes mellitus with other diabetic kidney complication: Secondary | ICD-10-CM | POA: Diagnosis not present

## 2014-11-09 DIAGNOSIS — Z6830 Body mass index (BMI) 30.0-30.9, adult: Secondary | ICD-10-CM | POA: Diagnosis not present

## 2014-11-09 DIAGNOSIS — N183 Chronic kidney disease, stage 3 (moderate): Secondary | ICD-10-CM | POA: Diagnosis not present

## 2014-11-09 DIAGNOSIS — I10 Essential (primary) hypertension: Secondary | ICD-10-CM | POA: Diagnosis not present

## 2014-11-21 ENCOUNTER — Telehealth: Payer: Self-pay | Admitting: *Deleted

## 2014-11-21 DIAGNOSIS — E785 Hyperlipidemia, unspecified: Secondary | ICD-10-CM

## 2014-11-21 DIAGNOSIS — E669 Obesity, unspecified: Secondary | ICD-10-CM

## 2014-11-21 DIAGNOSIS — Z79899 Other long term (current) drug therapy: Secondary | ICD-10-CM

## 2014-11-21 NOTE — Telephone Encounter (Signed)
Mail letter and lab slip- lipid,cmp

## 2014-11-21 NOTE — Telephone Encounter (Addendum)
-----   Message from Raiford Simmonds, RN sent at 09/13/2014 12:43 PM EDT ----- LIPIDS,CMP, IN 3 MONTHS ONLY

## 2014-12-19 ENCOUNTER — Ambulatory Visit (INDEPENDENT_AMBULATORY_CARE_PROVIDER_SITE_OTHER): Payer: Medicare Other | Admitting: Cardiology

## 2014-12-19 ENCOUNTER — Encounter: Payer: Self-pay | Admitting: Cardiology

## 2014-12-19 VITALS — BP 168/88 | HR 62 | Ht 74.0 in | Wt 221.4 lb

## 2014-12-19 DIAGNOSIS — E785 Hyperlipidemia, unspecified: Secondary | ICD-10-CM

## 2014-12-19 DIAGNOSIS — I714 Abdominal aortic aneurysm, without rupture, unspecified: Secondary | ICD-10-CM

## 2014-12-19 DIAGNOSIS — Z9861 Coronary angioplasty status: Secondary | ICD-10-CM | POA: Diagnosis not present

## 2014-12-19 DIAGNOSIS — E669 Obesity, unspecified: Secondary | ICD-10-CM

## 2014-12-19 DIAGNOSIS — I2510 Atherosclerotic heart disease of native coronary artery without angina pectoris: Secondary | ICD-10-CM

## 2014-12-19 DIAGNOSIS — I25708 Atherosclerosis of coronary artery bypass graft(s), unspecified, with other forms of angina pectoris: Secondary | ICD-10-CM

## 2014-12-19 DIAGNOSIS — I10 Essential (primary) hypertension: Secondary | ICD-10-CM | POA: Diagnosis not present

## 2014-12-19 MED ORDER — HYDRALAZINE HCL 100 MG PO TABS: 100.0000 mg | ORAL_TABLET | Freq: Two times a day (BID) | ORAL | Status: DC

## 2014-12-19 MED ORDER — CLOPIDOGREL BISULFATE 75 MG PO TABS: 75.0000 mg | ORAL_TABLET | Freq: Every day | ORAL | Status: DC

## 2014-12-19 NOTE — Progress Notes (Signed)
PCP: Leonides Sake, MD  Clinic Note: Chief Complaint  Patient presents with  . Follow-up     no chest pain, no shortness of breath, no edema,has pain in legs, has cramping in legs, no lightheadedness,no dizziness  . Coronary Artery Disease    CABG and PCI to SVG    HPI: [name] is a 81 y.o. male with a PMH below who presents today for four-month follow-up of CAD-CABG-PCI as well as other cardiac risk factors noted below.[name] was last seen on 09/13/2014 for his second post hospital follow-up.- he was having some dyspnea symptoms or Brilinta. The plan was to switch to Plavix after 3 months. Unfortunately he just simply stopped taking the Brilinta and did not let us know.  He has been taking aspirin.  Recent Hospitalizations: None since his non-STEMI in late January-early February 2016.  Studies Reviewed: Briefly reviewed cardiac catheterization from January 2016  Interval History: Anirudh presents today really without any major complaints or symptoms of a cardiac standpoint. His bruising is notably reduced since stopping his Brilinta. His activity level is mostly limited by right knee pain. He is however trying to continue to lose weight with diet and exercise. He's lost 3 pounds since his last visit. No recurrence of his anginal chest tightness or pressure with rest or exertion. No exertional dyspnea unless she overdoes it. No PND, orthopnea or edema. No palpitations, lightheadedness, dizziness, weakness or syncope/near syncope. No TIA/amaurosis fugax symptoms. No claudication.  Past Medical History  Diagnosis Date  . Non-STEMI (non-ST elevated myocardial infarction) July 2012    1. Ostial LAD 70% (to close to [facility] for PCI), subtotal occlusion of the RCA  . CAD in native artery     a. NSTEMI 11/2010 - CABG x2(LIMA to LAD, SVG to PDA-11/2010). b. NEGATIVE Lexiscan Myoview 6/9/'15, EF 53%, no perfusion abnormality, septal and apical HK noted. C. NSTEMI 05/2014 - s/p DES  to SVG-RPDA 06/18/14 (Xience Alpine DES 3.0 x 18 mm -3.35 mm), EF 60-65%.  . 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 Servando Snare)  . Postoperative atrial fibrillation     Post CABG, no sign recurrence  . HTN (hypertension)   . Type II diabetes mellitus with complication   . Hyperlipidemia LDL goal <70   . 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 Refusal of blood transfusions as patient is Jehovah's Witness   . Sleep apnea   . Abdominal aortic aneurysm     a. Aortic duplex 06/2014: mild aneurysmal dilatation of proximal abdominal aorta measuring 3.4x3.4cm. No sig change from 2012. F/u due 06/2016.  . Dizziness     a. Carotid duplex 03/2014: mild fibrous plaque, no significant stenosis.  . Stroke     a. 2014 with mild right hand weakness, nonhemorrhagic per pt.    Past Surgical History  Procedure Laterality Date  . Cardiac catheterization  12/11/2010    Dr. Chase Picket - subsequent cath - normal LV systolic function, no renal artery stenosis, severe 2-vessel disease with subtotaled RCA prox and distal 60% lesion and complex 70% area of narrowing of ostium of LAD  . Coronary artery bypass graft  12/15/2010    X2, Dr Servando Snare; LIMA to LAD, SVG to PDA;   Marland Kitchen Cystoscopy with urethral dilatation    . Transthoracic echocardiogram  07/28/2012    EF 55-60%, severe Conc LVH, Nl Systolic fxn, G1 DD,  Ao Sclerosis w/o stenosis; CRO PFO  . Left heart catheterization with coronary angiogram N/A 06/18/2014    Procedure: LEFT HEART CATHETERIZATION WITH CORONARY ANGIOGRAM;  Surgeon: Leonie Man, MD;  Location: [facility] CATH LAB;  Service: Cardiovascular;  Laterality: N/A;   cath February 2016 Severe Native 2 Vessel Disease involving 100% occluded RCA and 70 - 80% proximal LAD along with diffuse moderate to significant lesions in small obtuse marginal branches . Patent LIMA-LAD . Severe pSVG-RPDA-- PCI => Xience alpine DES 3.0 mm x 18 mm (3.35) . Well-preserved  LVEF with normal LVEDP  ROS: A comprehensive was performed. Review of Systems  Constitutional: Negative for malaise/fatigue.  HENT: Negative for nosebleeds.   Cardiovascular: Negative.  Negative for leg swelling.       For history of present illness  Gastrointestinal: Positive for blood in stool and melena.  Genitourinary: Negative for hematuria.  Musculoskeletal: Positive for joint pain (Right knee).  Neurological: Positive for dizziness (occasionally with position change).  Endo/Heme/Allergies: Bruises/bleeds easily (Less since stopping Brilinta).  Psychiatric/Behavioral: Negative for depression.  All other systems reviewed and are negative.   Prior to Admission medications   Medication Sig Start Date End Date Taking? Authorizing Provider  amLODipine (NORVASC) 10 MG tablet Take 10 mg by mouth every evening.    Yes Historical Provider, MD  aspirin EC 81 MG tablet Take 81 mg by mouth every morning.   Yes Historical Provider, MD  Bioflavonoid Products (SUPER C-500) TABS Take 1 tablet by mouth every evening.    Yes Historical Provider, MD  bisacodyl (DULCOLAX) 5 MG EC tablet Take 1 tablet (5 mg total) by mouth daily as needed for moderate constipation. 06/27/14  Yes Dayna N Dunn, PA-C  docusate sodium (COLACE) 100 MG capsule Take 1 capsule (100 mg total) by mouth 2 (two) times daily. 06/27/14  Yes Dayna N Dunn, PA-C  doxazosin (CARDURA) 4 MG tablet Take 1 tablet by mouth daily. 03/27/14  Yes Historical Provider, MD  glimepiride (AMARYL) 4 MG tablet Take 4 mg by mouth 2 (two) times daily with a meal.    Yes Historical Provider, MD  hydrALAZINE (APRESOLINE) 25 MG tablet Take 1 tablet (25 mg total) by mouth 2 (two) times daily. Patient needs to take with Hydralazine 50 mg to equal 75 mg 07/12/14  Yes Brett Canales, PA-C  hydrALAZINE (APRESOLINE) 50 MG tablet Take 1 tablet (50 mg total) by mouth 2 (two) times daily. Patient needs to take with hydralazine 25 mg to equal 75 mg 07/12/14  Yes Brett Canales, PA-C  labetalol (NORMODYNE) 200 MG tablet Take 0.5 tablets (100 mg total) by mouth 2 (two) times daily. 02/23/14  Yes Lorretta Harp, MD  losartan-hydrochlorothiazide (HYZAAR) 100-25 MG per tablet Take 1 tablet by mouth every morning.    Yes Historical Provider, MD  metFORMIN (GLUCOPHAGE) 1000 MG tablet Take 1 tablet by mouth 2 (two) times daily. 07/25/14  Yes Historical Provider, MD  nitroGLYCERIN (NITROSTAT) 0.4 MG SL tablet Place 1 tablet (0.4 mg total) under the tongue every 5 (five) minutes as needed for chest pain (up to 3 doses). 06/19/14  Yes Dayna N Dunn, PA-C  rosuvastatin (CRESTOR) 5 MG tablet Take 1 tablet (5 mg total) by mouth 2 (two) times a week. 06/27/14  Yes Dayna N Dunn, PA-C   Allergies  Allergen Reactions  . Atorvastatin     Myalgias  . Crestor [Rosuvastatin]   . Other Other (See Comments)    No  BLOOD PRODUCTS -  Pt is Jeohovah's Witness  . Pravastatin     Myalgias  . Simvastatin     Myalgias     History   Social History  . Marital Status: Married    Spouse Name: [name]  . Number of Children: 1  . Years of Education: N/A   Occupational History  .     Social History Main Topics  . Smoking status: Former Smoker    Types: Cigarettes    Quit date: 05/27/1957  . Smokeless tobacco: Never Used  . Alcohol Use: No  . Drug Use: No  . Sexual Activity: Not on file   Other Topics Concern  . None   Social History Narrative   Married father of one.  Previously uses stationary bike routinely.  Now reduced due to other social stressors.   Quit smoking 50 years ago.  Does not drink alcohol   Family History  Problem Relation Age of Onset  . Heart Problems Father 68    Wt Readings from Last 3 Encounters:  12/19/14 221 lb 6 oz (100.415 kg)  09/13/14 224 lb 6.4 oz (101.787 kg)  07/12/14 227 lb (102.967 kg)    PHYSICAL EXAM BP 168/88 mmHg  Pulse 62  Ht 6\' 2"  (1.88 m)  Wt 221 lb 6 oz (100.415 kg)  BMI 28.41 kg/m2 General appearance: alert, cooperative,  appears stated age, no distress and mildly obese HEENT: [place]/AT, EOMI, MMM, anicteric sclera Neck: no adenopathy, no carotid bruit and no JVD Lungs: clear to auscultation bilaterally, normal percussion bilaterally and non-labored Heart: regular rate and rhythm, S1, S2 normal, soft 1/6 SEM at RUSB radiating to carotids, click, rub or gallop ; nondisplaced PMI Abdomen: soft, non-tender; bowel sounds normal; no masses, no organomegaly;  Extremities: extremities normal, atraumatic, no cyanosis, and ~2+ bilateral LE edema Pulses: 2+ and symmetric;  Neurologic: Mental status: Alert, oriented, thought content appropriate   Adult ECG Report not checked  Other studies Reviewed: Additional studies/ records that were reviewed today include:  Recent Labs:   Lab Results  Component Value Date   CHOL 131; 123  06/27/2014  - 11/01/2014    HDL 48 ; 46  06/27/2014  -- 11/01/2014    LDLCALC 69 ; 61  06/27/2014  -- 11/01/2014    TRIG 71 ; 80  06/27/2014  -- 11/01/2014    CHOLHDL 2.7 06/27/2014  -- 11/01/2014     ASSESSMENT / PLAN: Problem List Items Addressed This Visit    Abdominal aortic aneurysm (Chronic)    Repeat duplex ultrasound in 2018       Relevant Medications   hydrALAZINE (APRESOLINE) 100 MG tablet   CAD S/P percutaneous coronary angioplasty - Primary (Chronic)    DES stent in a vein graft. I'm little concerned that he has not been on dual antiplatelet. We will start Plavix with a loading dose of 4 tablets 1 followed by 75 mg daily. Continue aspirin for now He is on labetalol which may not be the best beta blocker for him. Consider converting to carvedilol. He is on hydralazine and statin. Also on ARB      Relevant Medications   hydrALAZINE (APRESOLINE) 100 MG tablet   Coronary artery disease involving coronary bypass graft of native heart with other forms of angina pectoris (Chronic)    Based on the fact that he has vein graft disease with a recent stent, we would probably  consider checking a stress test sooner than the original every 4 years. Likely check another stress test  in roughly 2 years from his recent non-STEMI.      Relevant Medications   hydrALAZINE (APRESOLINE) 100 MG tablet   Hyperlipidemia LDL goal <70 (Chronic)    Recent labs showed total cholesterol 123, triglycerides 80, HDL 46, LDL 61 with normal LFTs. Well controlled on current dose of Crestor 2 times a day.      Relevant Medications   hydrALAZINE (APRESOLINE) 100 MG tablet   Moderate essential hypertension (Chronic)    Inadequate blood pressure control. Increase hydralazine to 100 mg twice a day. Next step will be to likely switch from labetalol to carvedilol. If his pressures continue to be elevated I will consider having him follow-up with our pharmacy team in order to help titrate medications further versus making the switch from labetalol to carvedilol      Relevant Medications   hydrALAZINE (APRESOLINE) 100 MG tablet   Obesity (Chronic)    Doing well overall with weight loss. Encouraged continued efforts.         Current medicines are reviewed at length with the patient today. (+/- concerns) no longer taking Brilinta The following changes have been made: Starting Plavix,  Studies Ordered:   No orders of the defined types were placed in this encounter.   CHANGE HYDRALAZINE TO 100 MG A DAY  -TAKE PRESCRIPTION TO PHARMACY  (YOU MAY TAKE 2 TABLET OF 50 MG TO EQUAL 100 MG)  START CLOPIDOGREL 75 MG DAILY THE FIRST DAY TAKE 4 TABLET  THEN TAKE ONE TABLET DAILY AFTERWARDS.  NO OTHER CHANGES  WITH CURRENT MEDICATIONS  Your physician wants you to follow-up in 6 MONTHS DR HARDING-30 MIN APPOINTMENT.   Leonie Man, M.D., M.S. Interventional Cardiologist   Pager # [phone]

## 2014-12-19 NOTE — Patient Instructions (Addendum)
CHANGE HYDRALAZINE TO 100 MG A DAY  -TAKE PRESCRIPTION TO PHARMACY  (YOU MAY TAKE 2 TABLET OF 50 MG TO EQUAL 100 MG)  START CLOPIDOGREL 75 MG DAILY THE FIRST DAY TAKE 4 TABLET  THEN TAKE ONE TABLET DAILY AFTERWARDS.  NO OTHER CHANGES  WITH CURRENT MEDICATIONS  Your physician wants you to follow-up in 6 MONTHS DR HARDING-30 MIN APPOINTMENT.  You will receive a reminder letter in the mail two months in advance. If you don't receive a letter, please call our office to schedule the follow-up appointment.

## 2014-12-21 ENCOUNTER — Encounter: Payer: Self-pay | Admitting: Cardiology

## 2014-12-22 NOTE — Assessment & Plan Note (Signed)
Recent labs showed total cholesterol 123, triglycerides 80, HDL 46, LDL 61 with normal LFTs. Well controlled on current dose of Crestor 2 times a day.

## 2014-12-22 NOTE — Assessment & Plan Note (Signed)
Doing well overall with weight loss. Encouraged continued efforts.

## 2014-12-22 NOTE — Assessment & Plan Note (Signed)
Repeat duplex ultrasound in 2018

## 2014-12-22 NOTE — Assessment & Plan Note (Signed)
Based on the fact that he has vein graft disease with a recent stent, we would probably consider checking a stress test sooner than the original every 4 years. Likely check another stress test in roughly 2 years from his recent non-STEMI.

## 2014-12-22 NOTE — Assessment & Plan Note (Signed)
Inadequate blood pressure control. Increase hydralazine to 100 mg twice a day. Next step will be to likely switch from labetalol to carvedilol. If his pressures continue to be elevated I will consider having him follow-up with our pharmacy team in order to help titrate medications further versus making the switch from labetalol to carvedilol

## 2014-12-22 NOTE — Assessment & Plan Note (Signed)
DES stent in a vein graft. I'm little concerned that he has not been on dual antiplatelet. We will start Plavix with a loading dose of 4 tablets 1 followed by 75 mg daily. Continue aspirin for now He is on labetalol which may not be the best beta blocker for him. Consider converting to carvedilol. He is on hydralazine and statin. Also on ARB

## 2015-01-07 ENCOUNTER — Encounter: Payer: Self-pay | Admitting: Cardiology

## 2015-01-22 ENCOUNTER — Emergency Department (HOSPITAL_COMMUNITY): Payer: Medicare Other

## 2015-01-22 ENCOUNTER — Inpatient Hospital Stay (HOSPITAL_COMMUNITY): Payer: Medicare Other

## 2015-01-22 ENCOUNTER — Encounter (HOSPITAL_COMMUNITY): Payer: Self-pay | Admitting: Emergency Medicine

## 2015-01-22 ENCOUNTER — Other Ambulatory Visit (HOSPITAL_COMMUNITY): Payer: PRIVATE HEALTH INSURANCE

## 2015-01-22 ENCOUNTER — Inpatient Hospital Stay (HOSPITAL_COMMUNITY)
Admission: EM | Admit: 2015-01-22 | Discharge: 2015-01-26 | DRG: 038 | Disposition: A | Discharge status: Home / Self Care | Payer: Medicare Other | Attending: Neurology | Admitting: Neurology

## 2015-01-22 DIAGNOSIS — I6523 Occlusion and stenosis of bilateral carotid arteries: Secondary | ICD-10-CM | POA: Diagnosis not present

## 2015-01-22 DIAGNOSIS — E785 Hyperlipidemia, unspecified: Secondary | ICD-10-CM | POA: Diagnosis present

## 2015-01-22 DIAGNOSIS — Z79899 Other long term (current) drug therapy: Secondary | ICD-10-CM

## 2015-01-22 DIAGNOSIS — Z8673 Personal history of transient ischemic attack (TIA), and cerebral infarction without residual deficits: Secondary | ICD-10-CM

## 2015-01-22 DIAGNOSIS — I63232 Cerebral infarction due to unspecified occlusion or stenosis of left carotid arteries: Secondary | ICD-10-CM | POA: Diagnosis not present

## 2015-01-22 DIAGNOSIS — G8191 Hemiplegia, unspecified affecting right dominant side: Secondary | ICD-10-CM | POA: Diagnosis present

## 2015-01-22 DIAGNOSIS — Z7902 Long term (current) use of antithrombotics/antiplatelets: Secondary | ICD-10-CM

## 2015-01-22 DIAGNOSIS — I639 Cerebral infarction, unspecified: Secondary | ICD-10-CM

## 2015-01-22 DIAGNOSIS — E118 Type 2 diabetes mellitus with unspecified complications: Secondary | ICD-10-CM | POA: Diagnosis present

## 2015-01-22 DIAGNOSIS — I6789 Other cerebrovascular disease: Secondary | ICD-10-CM | POA: Diagnosis not present

## 2015-01-22 DIAGNOSIS — R531 Weakness: Secondary | ICD-10-CM | POA: Diagnosis not present

## 2015-01-22 DIAGNOSIS — I63032 Cerebral infarction due to thrombosis of left carotid artery: Secondary | ICD-10-CM

## 2015-01-22 DIAGNOSIS — G4733 Obstructive sleep apnea (adult) (pediatric): Secondary | ICD-10-CM | POA: Diagnosis present

## 2015-01-22 DIAGNOSIS — Z955 Presence of coronary angioplasty implant and graft: Secondary | ICD-10-CM

## 2015-01-22 DIAGNOSIS — Z951 Presence of aortocoronary bypass graft: Secondary | ICD-10-CM | POA: Diagnosis not present

## 2015-01-22 DIAGNOSIS — I10 Essential (primary) hypertension: Secondary | ICD-10-CM | POA: Diagnosis present

## 2015-01-22 DIAGNOSIS — I2510 Atherosclerotic heart disease of native coronary artery without angina pectoris: Secondary | ICD-10-CM | POA: Diagnosis present

## 2015-01-22 DIAGNOSIS — Z87891 Personal history of nicotine dependence: Secondary | ICD-10-CM

## 2015-01-22 DIAGNOSIS — I63512 Cerebral infarction due to unspecified occlusion or stenosis of left middle cerebral artery: Secondary | ICD-10-CM

## 2015-01-22 DIAGNOSIS — I714 Abdominal aortic aneurysm, without rupture: Secondary | ICD-10-CM | POA: Diagnosis present

## 2015-01-22 DIAGNOSIS — J9811 Atelectasis: Secondary | ICD-10-CM | POA: Diagnosis not present

## 2015-01-22 DIAGNOSIS — I6522 Occlusion and stenosis of left carotid artery: Secondary | ICD-10-CM

## 2015-01-22 DIAGNOSIS — I771 Stricture of artery: Secondary | ICD-10-CM

## 2015-01-22 DIAGNOSIS — I4891 Unspecified atrial fibrillation: Secondary | ICD-10-CM | POA: Diagnosis present

## 2015-01-22 DIAGNOSIS — G4730 Sleep apnea, unspecified: Secondary | ICD-10-CM | POA: Diagnosis present

## 2015-01-22 DIAGNOSIS — M6289 Other specified disorders of muscle: Secondary | ICD-10-CM | POA: Diagnosis not present

## 2015-01-22 DIAGNOSIS — E1159 Type 2 diabetes mellitus with other circulatory complications: Secondary | ICD-10-CM | POA: Diagnosis not present

## 2015-01-22 DIAGNOSIS — I252 Old myocardial infarction: Secondary | ICD-10-CM | POA: Diagnosis not present

## 2015-01-22 DIAGNOSIS — R200 Anesthesia of skin: Secondary | ICD-10-CM | POA: Diagnosis not present

## 2015-01-22 HISTORY — DX: Cerebral infarction due to unspecified occlusion or stenosis of left middle cerebral artery: I63.512

## 2015-01-22 LAB — LIPID PANEL
Cholesterol: 110 mg/dL (ref 0–200)
HDL: 44 mg/dL (ref >40)
LDL CALC: 61 mg/dL (ref 0–99)
Total CHOL/HDL Ratio: 2.5 RATIO
Triglycerides: 24 mg/dL (ref <150)
VLDL: 5 mg/dL (ref 0–40)

## 2015-01-22 LAB — CBC
HEMATOCRIT: 37 % — AB (ref 39.0–52.0)
HEMOGLOBIN: 12.2 g/dL — AB (ref 13.0–17.0)
MCH: 29.3 pg (ref 26.0–34.0)
MCHC: 33 g/dL (ref 30.0–36.0)
MCV: 88.9 fL (ref 78.0–100.0)
PLATELETS: 338 10*3/uL (ref 150–400)
RBC: 4.16 MIL/uL — AB (ref 4.22–5.81)
RDW: 14.8 % (ref 11.5–15.5)
WBC: 6.7 10*3/uL (ref 4.0–10.5)

## 2015-01-22 LAB — RAPID URINE DRUG SCREEN, HOSP PERFORMED
AMPHETAMINES: NOT DETECTED
BARBITURATES: NOT DETECTED
Benzodiazepines: NOT DETECTED
Cocaine: NOT DETECTED
Opiates: NOT DETECTED
TETRAHYDROCANNABINOL: NOT DETECTED

## 2015-01-22 LAB — I-STAT CHEM 8, ED
BUN: 22 mg/dL — AB (ref 6–20)
CALCIUM ION: 1.08 mmol/L — AB (ref 1.13–1.30)
CHLORIDE: 104 mmol/L (ref 101–111)
CREATININE: 0.8 mg/dL (ref 0.61–1.24)
GLUCOSE: 202 mg/dL — AB (ref 65–99)
HCT: 39 % (ref 39.0–52.0)
Hemoglobin: 13.3 g/dL (ref 13.0–17.0)
Potassium: 3.8 mmol/L (ref 3.5–5.1)
Sodium: 139 mmol/L (ref 135–145)
TCO2: 24 mmol/L (ref 0–100)

## 2015-01-22 LAB — PROTIME-INR
INR: 1.07 (ref 0.00–1.49)
PROTHROMBIN TIME: 14.1 s (ref 11.6–15.2)

## 2015-01-22 LAB — DIFFERENTIAL
Basophils Absolute: 0.1 10*3/uL (ref 0.0–0.1)
Basophils Relative: 1 % (ref 0–1)
Eosinophils Absolute: 0.4 10*3/uL (ref 0.0–0.7)
Eosinophils Relative: 6 % — ABNORMAL HIGH (ref 0–5)
LYMPHS PCT: 21 % (ref 12–46)
Lymphs Abs: 1.4 10*3/uL (ref 0.7–4.0)
MONO ABS: 0.6 10*3/uL (ref 0.1–1.0)
Monocytes Relative: 9 % (ref 3–12)
NEUTROS ABS: 4.2 10*3/uL (ref 1.7–7.7)
Neutrophils Relative %: 63 % (ref 43–77)

## 2015-01-22 LAB — URINALYSIS, ROUTINE W REFLEX MICROSCOPIC
BILIRUBIN URINE: NEGATIVE
Glucose, UA: 100 mg/dL — AB
HGB URINE DIPSTICK: NEGATIVE
KETONES UR: NEGATIVE mg/dL
Leukocytes, UA: NEGATIVE
Nitrite: NEGATIVE
Specific Gravity, Urine: 1.013 (ref 1.005–1.030)
UROBILINOGEN UA: 0.2 mg/dL (ref 0.0–1.0)
pH: 6.5 (ref 5.0–8.0)

## 2015-01-22 LAB — COMPREHENSIVE METABOLIC PANEL
ALK PHOS: 52 U/L (ref 38–126)
ALT: 15 U/L — AB (ref 17–63)
AST: 19 U/L (ref 15–41)
Albumin: 3.9 g/dL (ref 3.5–5.0)
Anion gap: 8 (ref 5–15)
BUN: 19 mg/dL (ref 6–20)
CALCIUM: 9.2 mg/dL (ref 8.9–10.3)
CO2: 24 mmol/L (ref 22–32)
CREATININE: 0.9 mg/dL (ref 0.61–1.24)
Chloride: 106 mmol/L (ref 101–111)
GFR calc non Af Amer: >60 mL/min (ref >60)
GLUCOSE: 199 mg/dL — AB (ref 65–99)
Potassium: 3.9 mmol/L (ref 3.5–5.1)
SODIUM: 138 mmol/L (ref 135–145)
Total Bilirubin: 0.7 mg/dL (ref 0.3–1.2)
Total Protein: 6.1 g/dL — ABNORMAL LOW (ref 6.5–8.1)

## 2015-01-22 LAB — GLUCOSE, CAPILLARY
GLUCOSE-CAPILLARY: 80 mg/dL (ref 65–99)
GLUCOSE-CAPILLARY: 131 mg/dL — AB (ref 65–99)

## 2015-01-22 LAB — MRSA PCR SCREENING: MRSA BY PCR: NEGATIVE

## 2015-01-22 LAB — ETHANOL: Alcohol, Ethyl (B): <5 mg/dL (ref <5)

## 2015-01-22 LAB — URINE MICROSCOPIC-ADD ON

## 2015-01-22 LAB — I-STAT TROPONIN, ED: Troponin i, poc: 0 ng/mL (ref 0.00–0.08)

## 2015-01-22 LAB — APTT: aPTT: 29 seconds (ref 24–37)

## 2015-01-22 LAB — MAGNESIUM: MAGNESIUM: 2 mg/dL (ref 1.7–2.4)

## 2015-01-22 MED ORDER — SODIUM CHLORIDE 0.9 % IV SOLN
INTRAVENOUS | Status: DC
  Administered 2015-01-22: 18:00:00 via INTRAVENOUS

## 2015-01-22 MED ORDER — ASPIRIN 325 MG PO TABS
325.0000 mg | ORAL_TABLET | Freq: Every day | ORAL | Status: DC
  Administered 2015-01-22: 325 mg via ORAL
  Filled 2015-01-22 (×2): qty 1

## 2015-01-22 MED ORDER — ASPIRIN 325 MG PO TABS: 325.0000 mg | ORAL_TABLET | Freq: Once | ORAL | Status: DC

## 2015-01-22 MED ORDER — ROSUVASTATIN CALCIUM 5 MG PO TABS
5.0000 mg | ORAL_TABLET | ORAL | Status: DC
  Filled 2015-01-22: qty 1

## 2015-01-22 MED ORDER — SENNOSIDES-DOCUSATE SODIUM 8.6-50 MG PO TABS: 1.0000 | ORAL_TABLET | Freq: Every evening | ORAL | Status: DC | PRN

## 2015-01-22 MED ORDER — HEPARIN (PORCINE) IN NACL 100-0.45 UNIT/ML-% IJ SOLN
1200.0000 [IU]/h | INTRAMUSCULAR | Status: DC
  Administered 2015-01-22: 1200 [IU]/h via INTRAVENOUS
  Filled 2015-01-22 (×2): qty 250

## 2015-01-22 MED ORDER — IOHEXOL 350 MG/ML SOLN
50.0000 mL | Freq: Once | INTRAVENOUS | Status: AC | PRN
  Administered 2015-01-22: 50 mL via INTRAVENOUS

## 2015-01-22 MED ORDER — INSULIN ASPART 100 UNIT/ML ~~LOC~~ SOLN
0.0000 [IU] | Freq: Three times a day (TID) | SUBCUTANEOUS | Status: DC
  Administered 2015-01-23 – 2015-01-25 (×3): 3 [IU] via SUBCUTANEOUS
  Administered 2015-01-25: 5 [IU] via SUBCUTANEOUS
  Administered 2015-01-25: 2 [IU] via SUBCUTANEOUS
  Administered 2015-01-26: 3 [IU] via SUBCUTANEOUS
  Administered 2015-01-26: 2 [IU] via SUBCUTANEOUS

## 2015-01-22 MED ORDER — LABETALOL HCL 5 MG/ML IV SOLN: 10.0000 mg | INTRAVENOUS | Status: DC | PRN

## 2015-01-22 MED ORDER — ASPIRIN EC 81 MG PO TBEC: 81.0000 mg | DELAYED_RELEASE_TABLET | Freq: Every morning | ORAL | Status: DC

## 2015-01-22 MED ORDER — STROKE: EARLY STAGES OF RECOVERY BOOK
Freq: Once | Status: AC
  Administered 2015-01-22: 20:00:00
  Filled 2015-01-22: qty 1

## 2015-01-22 MED ORDER — STROKE: EARLY STAGES OF RECOVERY BOOK
Freq: Once | Status: DC
  Filled 2015-01-22: qty 1

## 2015-01-22 MED ORDER — SENNOSIDES-DOCUSATE SODIUM 8.6-50 MG PO TABS
1.0000 | ORAL_TABLET | Freq: Every evening | ORAL | Status: DC | PRN
  Filled 2015-01-22: qty 1

## 2015-01-22 MED ORDER — ENOXAPARIN SODIUM 40 MG/0.4ML ~~LOC~~ SOLN
40.0000 mg | SUBCUTANEOUS | Status: DC
  Filled 2015-01-22: qty 0.4

## 2015-01-22 NOTE — ED Provider Notes (Signed)
CSN: [id number]     Arrival date & time 01/22/15  0820 History   First MD Initiated Contact with Patient 01/22/15 [phone]     Chief Complaint  Patient presents with  . Cerebrovascular Accident     (Consider location/radiation/quality/duration/timing/severity/associated sxs/prior Treatment) HPI Comments: Patient presents to the ER for evaluation of weakness of his right arm. Patient reports that he woke up 2 days ago feeling like he had slept on his arm and it was experiencing pins and needles. He thought we could better, but has not improved. He is having trouble moving his hand and grasping items. He has not noticed any facial numbness, tingling, dropping. No speech disturbance. He denies having trouble with his right leg.  Patient is a 81 y.o. male presenting with Acute Neurological Problem.  Cerebrovascular Accident    Past Medical History  Diagnosis Date  . Non-STEMI (non-ST elevated myocardial infarction) July 2012    1. Ostial LAD 70% (to close to [facility] for PCI), subtotal occlusion of the RCA  . CAD in native artery     a. NSTEMI 11/2010 - CABG x2(LIMA to LAD, SVG to PDA-11/2010). b. NEGATIVE Lexiscan Myoview 6/9/'15, EF 53%, no perfusion abnormality, septal and apical HK noted. C. NSTEMI 05/2014 - s/p DES to SVG-RPDA 06/18/14 (Xience Alpine DES 3.0 x 18 mm -3.35 mm), EF 60-65%.  . 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 Servando Snare)  . Postoperative atrial fibrillation     Post CABG, no sign recurrence  . HTN (hypertension)   . Type II diabetes mellitus with complication   . Hyperlipidemia LDL goal <70   . 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 Refusal of blood transfusions as patient is Jehovah's Witness   . Sleep apnea   . Abdominal aortic aneurysm     a. Aortic duplex 06/2014: mild aneurysmal dilatation of proximal abdominal aorta measuring 3.4x3.4cm. No sig change from 2012. F/u due 06/2016.  . Dizziness     a. Carotid duplex 03/2014:  mild fibrous plaque, no significant stenosis.  . Stroke     a. 2014 with mild right hand weakness, nonhemorrhagic per pt.   Past Surgical History  Procedure Laterality Date  . Cardiac catheterization  12/11/2010    Dr. Chase Picket - subsequent cath - normal LV systolic function, no renal artery stenosis, severe 2-vessel disease with subtotaled RCA prox and distal 60% lesion and complex 70% area of narrowing of ostium of LAD  . Coronary artery bypass graft  12/15/2010    X2, Dr Servando Snare; LIMA to LAD, SVG to PDA;   Marland Kitchen Cystoscopy with urethral dilatation    . Transthoracic echocardiogram  07/28/2012    EF 55-60%, severe Conc LVH, Nl Systolic fxn, G1 DD,  Ao Sclerosis w/o stenosis; CRO PFO  . Left heart catheterization with coronary angiogram N/A 06/18/2014    Procedure: LEFT HEART CATHETERIZATION WITH CORONARY ANGIOGRAM;  Surgeon: Leonie Man, MD;  Location: [facility] CATH LAB;  Service: Cardiovascular;  Laterality: N/A;   Family History  Problem Relation Age of Onset  . Heart Problems Father 3   Social History  Substance Use Topics  . Smoking status: Former Smoker    Types: Cigarettes    Quit date: 05/27/1957  . Smokeless tobacco: Never Used  . Alcohol Use: No    Review of Systems  Neurological: Positive for weakness and numbness.  All other systems reviewed and are negative.  Allergies  Atorvastatin; Crestor; Other; Pravastatin; and Simvastatin  Home Medications   Prior to Admission medications   Medication Sig Start Date End Date Taking? Authorizing Provider  amLODipine (NORVASC) 10 MG tablet Take 10 mg by mouth every evening.     Historical Provider, MD  aspirin EC 81 MG tablet Take 81 mg by mouth every morning.    Historical Provider, MD  Bioflavonoid Products (SUPER C-500) TABS Take 1 tablet by mouth every evening.     Historical Provider, MD  bisacodyl (DULCOLAX) 5 MG EC tablet Take 1 tablet (5 mg total) by mouth daily as needed for moderate constipation. 06/27/14    Dayna N Dunn, PA-C  clopidogrel (PLAVIX) 75 MG tablet Take 1 tablet (75 mg total) by mouth daily. 12/19/14   Leonie Man, MD  docusate sodium (COLACE) 100 MG capsule Take 1 capsule (100 mg total) by mouth 2 (two) times daily. 06/27/14   Dayna N Dunn, PA-C  doxazosin (CARDURA) 4 MG tablet Take 1 tablet by mouth daily. 03/27/14   Historical Provider, MD  glimepiride (AMARYL) 4 MG tablet Take 4 mg by mouth 2 (two) times daily with a meal.     Historical Provider, MD  hydrALAZINE (APRESOLINE) 100 MG tablet Take 1 tablet (100 mg total) by mouth 2 (two) times daily. 12/19/14   Leonie Man, MD  labetalol (NORMODYNE) 200 MG tablet Take 0.5 tablets (100 mg total) by mouth 2 (two) times daily. 02/23/14   Lorretta Harp, MD  losartan-hydrochlorothiazide (HYZAAR) 100-25 MG per tablet Take 1 tablet by mouth every morning.     Historical Provider, MD  metFORMIN (GLUCOPHAGE) 1000 MG tablet Take 1 tablet by mouth 2 (two) times daily. 07/25/14   Historical Provider, MD  nitroGLYCERIN (NITROSTAT) 0.4 MG SL tablet Place 1 tablet (0.4 mg total) under the tongue every 5 (five) minutes as needed for chest pain (up to 3 doses). 06/19/14   Dayna N Dunn, PA-C  rosuvastatin (CRESTOR) 5 MG tablet Take 1 tablet (5 mg total) by mouth 2 (two) times a week. 06/27/14   Dayna N Dunn, PA-C   BP 171/70 mmHg  Pulse 54  Temp(Src) 97.7 F (36.5 C) (Oral)  Resp 20  Ht 6\' 1"  (1.854 m)  Wt 220 lb (99.791 kg)  BMI 29.03 kg/m2  SpO2 96% Physical Exam  Constitutional: He is oriented to person, place, and time. He appears well-developed and well-nourished. No distress.  HENT:  Head: Normocephalic and atraumatic.  Right Ear: Hearing normal.  Left Ear: Hearing normal.  Nose: Nose normal.  Mouth/Throat: Oropharynx is clear and moist and mucous membranes are normal.  Eyes: Conjunctivae and EOM are normal. Pupils are equal, round, and reactive to light.  Neck: Normal range of motion. Neck supple.  Cardiovascular: Regular rhythm, S1  normal and S2 normal.  Exam reveals no gallop and no friction rub.   No murmur heard. Pulmonary/Chest: Effort normal and breath sounds normal. No respiratory distress. He exhibits no tenderness.  Abdominal: Soft. Normal appearance and bowel sounds are normal. There is no hepatosplenomegaly. There is no tenderness. There is no rebound, no guarding, no tenderness at McBurney's point and negative Murphy's sign. No hernia.  Musculoskeletal: Normal range of motion.  Neurological: He is alert and oriented to person, place, and time. He has normal strength. No cranial nerve deficit or sensory deficit. He exhibits abnormal muscle tone (RUE). GCS eye subscore is 4. GCS verbal subscore is 5. GCS motor subscore is 6.  Significantly decreased grip  strength right arm compared to left, wrist drop present on right. Distal strength diminished more than proximal strength in right upper extremity  Right lower extremity strength 4 out of 5 against gravity Left lower extremity strength 5 out of 5 against gravity  Skin: Skin is warm, dry and intact. No rash noted. No cyanosis.  Psychiatric: He has a normal mood and affect. His speech is normal and behavior is normal. Thought content normal.  Nursing note and vitals reviewed.   ED Course  Procedures (including critical care time) Labs Review Labs Reviewed  CBC - Abnormal; Notable for the following:    RBC 4.16 (*)    Hemoglobin 12.2 (*)    HCT 37.0 (*)    All other components within normal limits  DIFFERENTIAL - Abnormal; Notable for the following:    Eosinophils Relative 6 (*)    All other components within normal limits  COMPREHENSIVE METABOLIC PANEL - Abnormal; Notable for the following:    Glucose, Bld 199 (*)    Total Protein 6.1 (*)    ALT 15 (*)    All other components within normal limits  URINALYSIS, ROUTINE W REFLEX MICROSCOPIC (NOT AT [facility]) - Abnormal; Notable for the following:    APPearance CLOUDY (*)    Glucose, UA 100 (*)    Protein, ur  >300 (*)    All other components within normal limits  I-STAT CHEM 8, ED - Abnormal; Notable for the following:    BUN 22 (*)    Glucose, Bld 202 (*)    Calcium, Ion 1.08 (*)    All other components within normal limits  ETHANOL  PROTIME-INR  APTT  URINE RAPID DRUG SCREEN, HOSP PERFORMED  URINE MICROSCOPIC-ADD ON  I-STAT TROPOININ, ED    Imaging Review Ct Head Wo Contrast  01/22/2015   CLINICAL DATA:  Right hand numbness and weakness.  EXAM: CT HEAD WITHOUT CONTRAST  TECHNIQUE: Contiguous axial images were obtained from the base of the skull through the vertex without intravenous contrast.  COMPARISON:  CT brain 02/19/2010  FINDINGS: There is no evidence of mass effect, midline shift, or extra-axial fluid collections. There is no evidence of a space-occupying lesion or intracranial hemorrhage. There is no evidence of a cortical-based area of acute infarction. There is generalized cerebral atrophy. There is periventricular white matter low attenuation likely secondary to microangiopathy.  The ventricles and sulci are appropriate for the patient's age. The basal cisterns are patent.  Visualized portions of the orbits are unremarkable. The mastoid sinuses are clear. There is bilateral ethmoid sinus mucosal thickening. Cerebrovascular atherosclerotic calcifications are noted.  The osseous structures are unremarkable.  IMPRESSION: 1. No acute intracranial pathology. 2. Chronic microvascular disease and cerebral atrophy.   Electronically Signed   By: Kathreen Devoid   On: 01/22/2015 10:03   I have personally reviewed and evaluated these images and lab results as part of my medical decision-making.   EKG Interpretation   Date/Time:  Tuesday January 22 2015 08:27:41 EDT Ventricular Rate:  65 PR Interval:  218 QRS Duration: 94 QT Interval:  410 QTC Calculation: 426 R Axis:   96 Text Interpretation:  Sinus rhythm Borderline prolonged PR interval  Anterior infarct, old Borderline T abnormalities,  inferior leads No  significant change since last tracing Confirmed by Anothy Bufano  MD,  [place] [phone]) on 01/22/2015 8:37:24 AM      MDM   Final diagnoses:  CVA (cerebral infarction)    Presents to the emergency department for evaluation of  numbness and weakness of right upper extremity. Symptoms began 2 days ago. Examination reveals significant weakness of the right upper extremity with decreased sensation and significant discoordination. Patient also has some mild weakness of the right lower extremity on examination. Workup has revealed CVA. Patient will be admitted to the medicine service for further evaluation. Workup has been performed in conjunction with Dr. Janann Colonel, neurology.    Orpah Greek, MD 01/22/15 1435

## 2015-01-22 NOTE — ED Notes (Signed)
Patient coming from home with LKN of Saturday at 2200 and woke up Sunday morning around 06:30 with numbness to the right hand.  Patient is alert and oriented.

## 2015-01-22 NOTE — Consult Note (Signed)
PHARMACY CONSULT NOTE  Pharmacy Consult :  Heparin infusion Indication : Stroke with internal carotid stenosis.  Allergies: Allergies  Allergen Reactions  . Atorvastatin     Myalgias  . Crestor [Rosuvastatin]   . Other Other (See Comments)    No  BLOOD PRODUCTS - Pt is [employer]  . Pravastatin     Myalgias  . Simvastatin     Myalgias     Heparin Dosing weight : 99 kg  Vital Signs: BP 179/87 mmHg  Pulse 52  Temp(Src) 97.7 F (36.5 C) (Oral)  Resp 16  Ht 6\' 1"  (1.854 m)  Wt 220 lb (99.791 kg)  BMI 29.03 kg/m2  SpO2 97%  Active Problems: Active Problems:   CVA (cerebral infarction)   Labs:  Recent Labs  01/22/15 0853 01/22/15 0905  HGB 12.2* 13.3  HCT 37.0* 39.0  PLT 338  --   APTT 29  --   LABPROT 14.1  --   INR 1.07  --   CREATININE 0.90 0.80   Estimated Creatinine Clearance: 90 mL/min (by C-G formula based on Cr of 0.8).  Medical / Surgical History: Past Medical History  Diagnosis Date  . Non-STEMI (non-ST elevated myocardial infarction) July 2012    1. Ostial LAD 70% (to close to [facility] for PCI), subtotal occlusion of the RCA  . CAD in native artery     a. NSTEMI 11/2010 - CABG x2(LIMA to LAD, SVG to PDA-11/2010). b. NEGATIVE Lexiscan Myoview 6/9/'15, EF 53%, no perfusion abnormality, septal and apical HK noted. C. NSTEMI 05/2014 - s/p DES to SVG-RPDA 06/18/14 (Xience Alpine DES 3.0 x 18 mm -3.35 mm), EF 60-65%.  . 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 Servando Snare)  . Postoperative atrial fibrillation     Post CABG, no sign recurrence  . HTN (hypertension)   . Type II diabetes mellitus with complication   . Hyperlipidemia LDL goal <70   . 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 Refusal of blood transfusions as patient is Jehovah's Witness   . Sleep apnea   . Abdominal aortic aneurysm     a. Aortic duplex 06/2014: mild aneurysmal dilatation of proximal abdominal aorta measuring 3.4x3.4cm. No sig  change from 2012. F/u due 06/2016.  . Dizziness     a. Carotid duplex 03/2014: mild fibrous plaque, no significant stenosis.  . Stroke     a. 2014 with mild right hand weakness, nonhemorrhagic per pt.   Past Surgical History  Procedure Laterality Date  . Cardiac catheterization  12/11/2010    Dr. Chase Picket - subsequent cath - normal LV systolic function, no renal artery stenosis, severe 2-vessel disease with subtotaled RCA prox and distal 60% lesion and complex 70% area of narrowing of ostium of LAD  . Coronary artery bypass graft  12/15/2010    X2, Dr Servando Snare; LIMA to LAD, SVG to PDA;   Marland Kitchen Cystoscopy with urethral dilatation    . Transthoracic echocardiogram  07/28/2012    EF 55-60%, severe Conc LVH, Nl Systolic fxn, G1 DD,  Ao Sclerosis w/o stenosis; CRO PFO  . Left heart catheterization with coronary angiogram N/A 06/18/2014    Procedure: LEFT HEART CATHETERIZATION WITH CORONARY ANGIOGRAM;  Surgeon: Leonie Man, MD;  Location: [facility] CATH LAB;  Service: Cardiovascular;  Laterality: N/A;    MEDICATION: Medication PTA:  (Not in a hospital admission)  Scheduled:  Scheduled:  .  stroke: mapping  our early stages of recovery book   Does not apply Once  . aspirin  325 mg Oral Daily  . insulin aspart  0-15 Units Subcutaneous TID WC  . [START ON 01/24/2015] rosuvastatin  5 mg Oral Once per day on Mon Thu   Infusion[s]: Infusions:  . sodium chloride     ASSESSMENT:  81 y/o male admitted with new stroke with internal carotid stenosis.  MRI head negative for bleed.  CT head/neck pending.  Patient not on anticoagulants.  GOAL:  Heparin Level  0.3 - 0.5 units/ml   PLAN: 1. Heparin will be started at 1200 units/hr, No Bolus..   The next Heparin Level in 8 hours. 2. Daily Heparin level, CBC while on Heparin.  Monitor for bleeding complications. Follow Platelet counts.  Marthenia Rolling,  Pharm.D   01/22/2015,  4:26 PM.

## 2015-01-22 NOTE — Consult Note (Addendum)
Stroke Consult    Chief Complaint: right sided weakness  HPI: [name] is an 81 y.o. male hx of CAD, HTN, DM, HLD presenting with 2 day history of right sided weakness. Patient notes waking up and feeling like his RUE was asleep with a pins and needles sensation. Notes difficulty moving his hand and grasping items. Symptoms have not improved so he decided to come to the ED. Denies any speech or visual deficits, denies LE weakness. No prior TIA or CVA. Notes having "A fib" and is unsure if he takes aspirin or coumadin (of note A fib is not listed in cardiology visits and coumadin not listed as a home med).   CT head imaging reviewed and overall unremarkable.   Date last known well: 01/20/2015 Time last known well: 0800 tPA Given: no, outside tPA window Modified Rankin: Rankin Score=0  Past Medical History  Diagnosis Date  . Non-STEMI (non-ST elevated myocardial infarction) July 2012    1. Ostial LAD 70% (to close to [facility] for PCI), subtotal occlusion of the RCA  . CAD in native artery     a. NSTEMI 11/2010 - CABG x2(LIMA to LAD, SVG to PDA-11/2010). b. NEGATIVE Lexiscan Myoview 6/9/'15, EF 53%, no perfusion abnormality, septal and apical HK noted. C. NSTEMI 05/2014 - s/p DES to SVG-RPDA 06/18/14 (Xience Alpine DES 3.0 x 18 mm -3.35 mm), EF 60-65%.  . 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 Servando Snare)  . Postoperative atrial fibrillation     Post CABG, no sign recurrence  . HTN (hypertension)   . Type II diabetes mellitus with complication   . Hyperlipidemia LDL goal <70   . 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 Refusal of blood transfusions as patient is Jehovah's Witness   . Sleep apnea   . Abdominal aortic aneurysm     a. Aortic duplex 06/2014: mild aneurysmal dilatation of proximal abdominal aorta measuring 3.4x3.4cm. No sig change from 2012. F/u due 06/2016.  . Dizziness     a. Carotid duplex 03/2014: mild fibrous plaque, no  significant stenosis.  . Stroke     a. 2014 with mild right hand weakness, nonhemorrhagic per pt.    Past Surgical History  Procedure Laterality Date  . Cardiac catheterization  12/11/2010    Dr. Chase Picket - subsequent cath - normal LV systolic function, no renal artery stenosis, severe 2-vessel disease with subtotaled RCA prox and distal 60% lesion and complex 70% area of narrowing of ostium of LAD  . Coronary artery bypass graft  12/15/2010    X2, Dr Servando Snare; LIMA to LAD, SVG to PDA;   Marland Kitchen Cystoscopy with urethral dilatation    . Transthoracic echocardiogram  07/28/2012    EF 55-60%, severe Conc LVH, Nl Systolic fxn, G1 DD,  Ao Sclerosis w/o stenosis; CRO PFO  . Left heart catheterization with coronary angiogram N/A 06/18/2014    Procedure: LEFT HEART CATHETERIZATION WITH CORONARY ANGIOGRAM;  Surgeon: Leonie Man, MD;  Location: [facility] CATH LAB;  Service: Cardiovascular;  Laterality: N/A;    Family History  Problem Relation Age of Onset  . Heart Problems Father 15   Social History:  reports that he quit smoking about 57 years ago. His smoking use included Cigarettes. He has never used smokeless tobacco. He reports that he does not drink alcohol or use illicit drugs.  Allergies:  Allergies  Allergen Reactions  . Atorvastatin  Myalgias  . Crestor [Rosuvastatin]   . Other Other (See Comments)    No  BLOOD PRODUCTS - Pt is [employer]'s Witness  . Pravastatin     Myalgias  . Simvastatin     Myalgias      (Not in a hospital admission)  ROS: Out of a complete 14 system review, the patient complains of only the following symptoms, and all other reviewed systems are negative. + weakness and sensory deficits  Physical Examination: Filed Vitals:   01/22/15 1015  BP: 175/72  Pulse: 56  Temp:   Resp: 19   Physical Exam  Constitutional: He appears well-developed and well-nourished.  Psych: Affect appropriate to situation Eyes: No scleral injection HENT: No OP  obstrucion Head: Normocephalic.  Cardiovascular: Normal rate and regular rhythm.  Respiratory: Effort normal and breath sounds normal.  GI: Soft. Bowel sounds are normal. No distension. There is no tenderness.  Skin: WDI  Neurologic Examination: Mental Status: Alert, oriented, thought content appropriate.  Speech fluent without evidence of aphasia.  Able to follow 3 step commands without difficulty. Cranial Nerves: II: funduscopic exam wnl bilaterally, visual fields grossly normal, pupils equal, round, reactive to light and accommodation III,IV, VI: ptosis not present, extra-ocular motions intact bilaterally V,VII: smile symmetric, facial light touch sensation normal bilaterally VIII: hearing normal bilaterally IX,X: gag reflex present XI: trapezius strength/neck flexion strength normal bilaterally XII: tongue strength normal  Motor: LUE 5/5 strength Proximal RUE 5-/5, biceps and triceps 5-/5. Wrist flex/ext 0-1/5, flex/ext of thumb 0-1/5, finger abd/adduction 1-2/5  Left Lower extremity     Right Lower extremity 5/5 hip flexor      4+/5 hip flexor 5/5 quadricep      5-/5 quadriceps  5/5 hamstrings     5-/5 hamstrings 5/5 plantar flexion       5-/5 plantar flexion 5/5 plantar extension     5/5 plantar extension Tone and bulk:normal tone throughout; no atrophy noted Sensory: Pinprick and light touch intact throughout, bilaterally Deep Tendon Reflexes: 2+ and symmetric throughout Plantars: Right: downgoing   Left: downgoing Cerebellar: normal finger-to-nose on left, unable to fully test on right, and normal heel-to-shin test Gait: deferred  Laboratory Studies:   Basic Metabolic Panel:  Recent Labs Lab 01/22/15 0853 01/22/15 0905  NA 138 139  K 3.9 3.8  CL 106 104  CO2 24  --   GLUCOSE 199* 202*  BUN 19 22*  CREATININE 0.90 0.80  CALCIUM 9.2  --     Liver Function Tests:  Recent Labs Lab 01/22/15 0853  AST 19  ALT 15*  ALKPHOS 52  BILITOT 0.7  PROT 6.1*   ALBUMIN 3.9   No results for input(s): LIPASE, AMYLASE in the last 168 hours. No results for input(s): AMMONIA in the last 168 hours.  CBC:  Recent Labs Lab 01/22/15 0853 01/22/15 0905  WBC 6.7  --   NEUTROABS 4.2  --   HGB 12.2* 13.3  HCT 37.0* 39.0  MCV 88.9  --   PLT 338  --     Cardiac Enzymes: No results for input(s): CKTOTAL, CKMB, CKMBINDEX, TROPONINI in the last 168 hours.  BNP: Invalid input(s): POCBNP  CBG: No results for input(s): GLUCAP in the last 168 hours.  Microbiology: Results for orders placed or performed during the hospital encounter of 10/23/13  MRSA PCR Screening     Status: None   Collection Time: 10/23/13 11:39 PM  Result Value Ref Range Status   MRSA by PCR NEGATIVE NEGATIVE Final  Comment:        The GeneXpert MRSA Assay (FDA approved for NASAL specimens only), is one component of a comprehensive MRSA colonization surveillance program. It is not intended to diagnose MRSA infection nor to guide or monitor treatment for MRSA infections.    Coagulation Studies:  Recent Labs  01/22/15 0853  LABPROT 14.1  INR 1.07    Urinalysis: No results for input(s): COLORURINE, LABSPEC, PHURINE, GLUCOSEU, HGBUR, BILIRUBINUR, KETONESUR, PROTEINUR, UROBILINOGEN, NITRITE, LEUKOCYTESUR in the last 168 hours.  Invalid input(s): APPERANCEUR  Lipid Panel:     Component Value Date/Time   CHOL 131 06/27/2014 1158   TRIG 71 06/27/2014 1158   HDL 48 06/27/2014 1158   CHOLHDL 2.7 06/27/2014 1158   VLDL 14 06/27/2014 1158   LDLCALC 69 06/27/2014 1158    HgbA1C:  Lab Results  Component Value Date   HGBA1C 8.0* 06/17/2014    Urine Drug Screen:  No results found for: LABOPIA, COCAINSCRNUR, LABBENZ, AMPHETMU, THCU, LABBARB  Alcohol Level:  Recent Labs Lab 01/22/15 [place] <5    Other results:  Imaging: Ct Head Wo Contrast  01/22/2015   CLINICAL DATA:  Right hand numbness and weakness.  EXAM: CT HEAD WITHOUT CONTRAST  TECHNIQUE:  Contiguous axial images were obtained from the base of the skull through the vertex without intravenous contrast.  COMPARISON:  CT brain 02/19/2010  FINDINGS: There is no evidence of mass effect, midline shift, or extra-axial fluid collections. There is no evidence of a space-occupying lesion or intracranial hemorrhage. There is no evidence of a cortical-based area of acute infarction. There is generalized cerebral atrophy. There is periventricular white matter low attenuation likely secondary to microangiopathy.  The ventricles and sulci are appropriate for the patient's age. The basal cisterns are patent.  Visualized portions of the orbits are unremarkable. The mastoid sinuses are clear. There is bilateral ethmoid sinus mucosal thickening. Cerebrovascular atherosclerotic calcifications are noted.  The osseous structures are unremarkable.  IMPRESSION: 1. No acute intracranial pathology. 2. Chronic microvascular disease and cerebral atrophy.   Electronically Signed   By: Kathreen Devoid   On: 01/22/2015 10:03    Assessment: 81 y.o. male with hx of HTN, HLD, DM, CAD presenting with 2 day history of right sided weakness mainly involving his distal RUE. Differential includes a CVA, likely small vessel disease. Would also consider a peripheral causes, especially a possible plexopathy as multiple nerve roots appear involved in his hand weakness. A peripheral cause is less likely based on LE involvement.    Plan: 1) MRI brain. If positive complete stroke workup 2) If MRI brain negative can follow up with outpatient neurology for EMG/NCS 3) Rehab evaluation. Can be completed as outpatient if no stroke present 4) ASA 81mg  daily   Jim Like, DO [facility] [phone]  If 7pm- 7am, please page neurology on call as listed in [facility]. 01/22/2015, 10:33 AM

## 2015-01-22 NOTE — ED Notes (Signed)
Patient in MRI 

## 2015-01-22 NOTE — ED Notes (Signed)
Holding patient in the ED until consult with IR.

## 2015-01-22 NOTE — Progress Notes (Signed)
Notified by ED physician that patients MRA shows severe focal stenosis of the left petrous carotid, 90% or greater with possible filling defect distal which could reprsent intraluminal thrombus or signal loss. Discussed case with neuro-IR, Dr Estanislado Pandy. As patient is clinically stable he will hold on urgent angiogram and intervention.   Discussed case with Dr Erlinda Hong, stroke neurologist. He is unclear if there is an urgent need for angiogram and IR intervention. Will start IV heparin and will order stat CT angiogram of head and neck. Will follow up with Dr Erlinda Hong once imaging is completed.    Jim Like, DO [facility] [phone]  If 7pm- 7am, please page neurology on call as listed in [place].

## 2015-01-22 NOTE — ED Notes (Signed)
Pt remains in MRI 

## 2015-01-22 NOTE — Progress Notes (Signed)
Pt initially admitted to Internal Medicine Teaching Service however due to MRI findings of possible intraluminal thrombosis, pt to be transferred to Neuro-ICU for further management.   Dr. Naaman Plummer

## 2015-01-22 NOTE — Consult Note (Signed)
Chief Complaint: Patient was seen in consultation today for Rt sided weakness; abnormal MRI Chief Complaint  Patient presents with  . Cerebrovascular Accident   at the request of Dr Janann Colonel  Referring Physician(s): Dr Jim Like  History of Present Illness: [name] is a 81 y.o. male   Pt has had sxs of Rt sided weakness yrs ago; sxs resolved and had regained most all movement and use in Rt arm.hand Developed Rt sided weakness and tingling in had/arm Sun 01/20/15 Has not improved To ED today MRI/MRA: IMPRESSION: Punctate foci of acute infarctions scattered throughout the anterior circulation distribution on the left consistent with micro embolic infarctions. Some of these could be watershed.  Chronic small vessel ischemic changes elsewhere throughout the brain. Old left parietal cortical infarction.  Severe focal stenosis of the left petrous carotid, 90% or greater. Possible filling defect distal to that which could be intraluminal thrombus or signal loss from severe turbulence. Stenoses in the anterior circulation intracranial branches, left worse than right. On the left, these could be atherosclerotic stenoses or conceivably due to nonocclusive embolic disease.  Chronically occluded right vertebral  Request for cerebral arteriogram per Dr Janann Colonel Dr Estanislado Pandy has seen and reviewed imaging Now scheduled for cerebral arteriogram 9/7  Past Medical History  Diagnosis Date  . Non-STEMI (non-ST elevated myocardial infarction) July 2012    1. Ostial LAD 70% (to close to [facility] for PCI), subtotal occlusion of the RCA  . CAD in native artery     a. NSTEMI 11/2010 - CABG x2(LIMA to LAD, SVG to PDA-11/2010). b. NEGATIVE Lexiscan Myoview 6/9/'15, EF 53%, no perfusion abnormality, septal and apical HK noted. C. NSTEMI 05/2014 - s/p DES to SVG-RPDA 06/18/14 (Xience Alpine DES 3.0 x 18 mm -3.35 mm), EF 60-65%.  . 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 Servando Snare)  .  Postoperative atrial fibrillation     Post CABG, no sign recurrence  . HTN (hypertension)   . Type II diabetes mellitus with complication   . Hyperlipidemia LDL goal <70   . 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 Refusal of blood transfusions as patient is Jehovah's Witness   . Sleep apnea   . Abdominal aortic aneurysm     a. Aortic duplex 06/2014: mild aneurysmal dilatation of proximal abdominal aorta measuring 3.4x3.4cm. No sig change from 2012. F/u due 06/2016.  . Dizziness     a. Carotid duplex 03/2014: mild fibrous plaque, no significant stenosis.  . Stroke     a. 2014 with mild right hand weakness, nonhemorrhagic per pt.    Past Surgical History  Procedure Laterality Date  . Cardiac catheterization  12/11/2010    Dr. Chase Picket - subsequent cath - normal LV systolic function, no renal artery stenosis, severe 2-vessel disease with subtotaled RCA prox and distal 60% lesion and complex 70% area of narrowing of ostium of LAD  . Coronary artery bypass graft  12/15/2010    X2, Dr Servando Snare; LIMA to LAD, SVG to PDA;   Marland Kitchen Cystoscopy with urethral dilatation    . Transthoracic echocardiogram  07/28/2012    EF 55-60%, severe Conc LVH, Nl Systolic fxn, G1 DD,  Ao Sclerosis w/o stenosis; CRO PFO  . Left heart catheterization with coronary angiogram N/A 06/18/2014    Procedure: LEFT HEART CATHETERIZATION WITH CORONARY ANGIOGRAM;  Surgeon: Leonie Man, MD;  Location: [facility] CATH LAB;  Service: Cardiovascular;  Laterality: N/A;    Allergies: Atorvastatin; Crestor; Other; Pravastatin; and Simvastatin  Medications: Prior to Admission medications   Medication Sig Start Date End Date Taking? Authorizing Provider  amLODipine (NORVASC) 10 MG tablet Take 10 mg by mouth every evening.    Yes Historical Provider, MD  Bioflavonoid Products (SUPER C-500) TABS Take 1 tablet by mouth every evening.    Yes Historical Provider, MD  bisacodyl (DULCOLAX) 5 MG EC tablet  Take 1 tablet (5 mg total) by mouth daily as needed for moderate constipation. 06/27/14  Yes Dayna N Dunn, PA-C  clopidogrel (PLAVIX) 75 MG tablet Take 1 tablet (75 mg total) by mouth daily. 12/19/14  Yes Leonie Man, MD  docusate sodium (COLACE) 100 MG capsule Take 1 capsule (100 mg total) by mouth 2 (two) times daily. 06/27/14  Yes Dayna N Dunn, PA-C  doxazosin (CARDURA) 4 MG tablet Take 1 tablet by mouth daily. 03/27/14  Yes Historical Provider, MD  glimepiride (AMARYL) 4 MG tablet Take 4 mg by mouth 2 (two) times daily with a meal.    Yes Historical Provider, MD  hydrALAZINE (APRESOLINE) 100 MG tablet Take 1 tablet (100 mg total) by mouth 2 (two) times daily. 12/19/14  Yes Leonie Man, MD  labetalol (NORMODYNE) 200 MG tablet Take 0.5 tablets (100 mg total) by mouth 2 (two) times daily. 02/23/14  Yes Lorretta Harp, MD  losartan-hydrochlorothiazide (HYZAAR) 100-25 MG per tablet Take 1 tablet by mouth every morning.    Yes Historical Provider, MD  metFORMIN (GLUCOPHAGE) 1000 MG tablet Take 1 tablet by mouth 2 (two) times daily. 07/25/14  Yes Historical Provider, MD  nitroGLYCERIN (NITROSTAT) 0.4 MG SL tablet Place 1 tablet (0.4 mg total) under the tongue every 5 (five) minutes as needed for chest pain (up to 3 doses). 06/19/14  Yes Dayna N Dunn, PA-C  rosuvastatin (CRESTOR) 5 MG tablet Take 1 tablet (5 mg total) by mouth 2 (two) times a week. Patient taking differently: Take 2.5 mg by mouth 2 (two) times a week.  06/27/14  Yes Dayna N Dunn, PA-C     Family History  Problem Relation Age of Onset  . Heart Problems Father 87    Social History   Social History  . Marital Status: Married    Spouse Name: [name]  . Number of Children: 1  . Years of Education: N/A   Occupational History  .     Social History Main Topics  . Smoking status: Former Smoker    Types: Cigarettes    Quit date: 05/27/1957  . Smokeless tobacco: Never Used  . Alcohol Use: No  . Drug Use: No  . Sexual Activity: Not  Asked   Other Topics Concern  . None   Social History Narrative   Married father of one.  Previously uses stationary bike routinely.  Now reduced due to other social stressors.   Quit smoking 50 years ago.  Does not drink alcohol     Review of Systems: A 12 point ROS discussed and pertinent positives are indicated in the HPI above.  All other systems are negative.  Review of Systems  Constitutional: Positive for activity change. Negative for fever and fatigue.  HENT: Negative for trouble swallowing and voice change.   Eyes: Negative for visual disturbance.  Respiratory: Negative for shortness of breath.   Gastrointestinal: Negative for abdominal pain.  Neurological: Positive for weakness and numbness. Negative for dizziness, tremors, seizures, syncope, facial asymmetry, speech difficulty, light-headedness and headaches.  Psychiatric/Behavioral:  Negative for behavioral problems and confusion.    Vital Signs: BP 179/87 mmHg  Pulse 52  Temp(Src) 97.7 F (36.5 C) (Oral)  Resp 16  Ht 6\' 1"  (1.854 m)  Wt 220 lb (99.791 kg)  BMI 29.03 kg/m2  SpO2 97%  Physical Exam  Constitutional: He is oriented to person, place, and time.  Cardiovascular: Normal rate, regular rhythm and normal heart sounds.   Pulmonary/Chest: Effort normal and breath sounds normal.  Abdominal: Soft. Bowel sounds are normal.  Musculoskeletal: Normal range of motion.  Rt hand without use Cannot control or hold up hand   Neurological: He is alert and oriented to person, place, and time.  Skin: Skin is warm and dry.  Psychiatric: He has a normal mood and affect. His behavior is normal. Judgment and thought content normal.  Nursing note and vitals reviewed.   Mallampati Score:  MD Evaluation Airway: WNL Heart: WNL Abdomen: WNL Chest/ Lungs: WNL ASA  Classification: 3 Mallampati/Airway Score: One  Imaging: Ct Head Wo Contrast  01/22/2015   CLINICAL DATA:  Right hand numbness and weakness.  EXAM: CT  HEAD WITHOUT CONTRAST  TECHNIQUE: Contiguous axial images were obtained from the base of the skull through the vertex without intravenous contrast.  COMPARISON:  CT brain 02/19/2010  FINDINGS: There is no evidence of mass effect, midline shift, or extra-axial fluid collections. There is no evidence of a space-occupying lesion or intracranial hemorrhage. There is no evidence of a cortical-based area of acute infarction. There is generalized cerebral atrophy. There is periventricular white matter low attenuation likely secondary to microangiopathy.  The ventricles and sulci are appropriate for the patient's age. The basal cisterns are patent.  Visualized portions of the orbits are unremarkable. The mastoid sinuses are clear. There is bilateral ethmoid sinus mucosal thickening. Cerebrovascular atherosclerotic calcifications are noted.  The osseous structures are unremarkable.  IMPRESSION: 1. No acute intracranial pathology. 2. Chronic microvascular disease and cerebral atrophy.   Electronically Signed   By: Kathreen Devoid   On: 01/22/2015 10:03   Mr Jodene Nam Head Wo Contrast  01/22/2015   CLINICAL DATA:  Two day history of right-sided weakness. Initial evaluation.  EXAM: MRI HEAD WITHOUT CONTRAST  MRA HEAD WITHOUT CONTRAST  TECHNIQUE: Multiplanar, multiecho pulse sequences of the brain and surrounding structures were obtained without intravenous contrast. Angiographic images of the head were obtained using MRA technique without contrast.  COMPARISON:  Head CT earlier same day.  MRI 02/27/2012.  FINDINGS: MRI HEAD FINDINGS  Diffusion imaging shows scattered punctate foci of acute infarction within the cortical brain throughout the left hemisphere consistent with micro embolic infarctions. No acute infarctions seen on the right or in the cerebellum.  There are mild chronic small-vessel ischemic changes of the brainstem. The cerebellum is normal. The cerebral hemispheres show generalized atrophy with old small vessel ischemic  changes within the white matter. 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 No evidence of mass lesion, hemorrhage, hydrocephalus or extra-axial collection. No pituitary mass. There is mild mucosal inflammation of the paranasal sinuses. No skull or skullbase lesion.  MRA HEAD FINDINGS  The right internal carotid artery is widely patent through the siphon region. The anterior and middle cerebral vessels are patent without correctable proximal stenosis, aneurysm or vascular malformation. More distal branch vessels do show some atherosclerotic narrowing and irregularity.  The left internal carotid artery shows a severe stenosis in the petrous region, 90% or greater. There is the appearance of an intraluminal filling defect beyond  that. In the siphon region, the vessel is widely patent. The anterior and middle cerebral vessels are patent, but there are areas of severe narrowing that could be due to atherosclerotic disease or possibly nonocclusive embolic material.  The right vertebral artery is chronically occluded. Left vertebral artery is widely patent to the basilar. No basilar stenosis. Posterior circulation branch vessels are patent. Right PCA receives it supply from the anterior circulation. More distal branch vessels show atherosclerotic irregularity.  IMPRESSION: Punctate foci of acute infarctions scattered throughout the anterior circulation distribution on the left consistent with micro embolic infarctions. Some of these could be watershed.  Chronic small vessel ischemic changes elsewhere throughout the brain. Old left parietal cortical infarction.  Severe focal stenosis of the left petrous carotid, 90% or greater. Possible filling defect distal to that which could be intraluminal thrombus or signal loss from severe turbulence. Stenoses in the anterior circulation intracranial branches, left worse than right. On the left, these could be atherosclerotic stenoses or conceivably due to  nonocclusive embolic disease.  Chronically occluded right vertebral.  Critical Value/emergent results were called by telephone at the time of interpretation on 01/22/2015 at 2:42 pm to Dr. Joseph Berkshire ; Jim Like , who verbally acknowledged these results.   Electronically Signed   By: Nelson Chimes M.D.   On: 01/22/2015 14:44   Mr Brain Wo Contrast  01/22/2015   CLINICAL DATA:  Two day history of right-sided weakness. Initial evaluation.  EXAM: MRI HEAD WITHOUT CONTRAST  MRA HEAD WITHOUT CONTRAST  TECHNIQUE: Multiplanar, multiecho pulse sequences of the brain and surrounding structures were obtained without intravenous contrast. Angiographic images of the head were obtained using MRA technique without contrast.  COMPARISON:  Head CT earlier same day.  MRI 02/27/2012.  FINDINGS: MRI HEAD FINDINGS  Diffusion imaging shows scattered punctate foci of acute infarction within the cortical brain throughout the left hemisphere consistent with micro embolic infarctions. No acute infarctions seen on the right or in the cerebellum.  There are mild chronic small-vessel ischemic changes of the brainstem. The cerebellum is normal. The cerebral hemispheres show generalized atrophy with old small vessel ischemic changes within the white matter. 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 No evidence of mass lesion, hemorrhage, hydrocephalus or extra-axial collection. No pituitary mass. There is mild mucosal inflammation of the paranasal sinuses. No skull or skullbase lesion.  MRA HEAD FINDINGS  The right internal carotid artery is widely patent through the siphon region. The anterior and middle cerebral vessels are patent without correctable proximal stenosis, aneurysm or vascular malformation. More distal branch vessels do show some atherosclerotic narrowing and irregularity.  The left internal carotid artery shows a severe stenosis in the petrous region, 90% or greater. There is the appearance of  an intraluminal filling defect beyond that. In the siphon region, the vessel is widely patent. The anterior and middle cerebral vessels are patent, but there are areas of severe narrowing that could be due to atherosclerotic disease or possibly nonocclusive embolic material.  The right vertebral artery is chronically occluded. Left vertebral artery is widely patent to the basilar. No basilar stenosis. Posterior circulation branch vessels are patent. Right PCA receives it supply from the anterior circulation. More distal branch vessels show atherosclerotic irregularity.  IMPRESSION: Punctate foci of acute infarctions scattered throughout the anterior circulation distribution on the left consistent with micro embolic infarctions. Some of these could be watershed.  Chronic small vessel ischemic changes elsewhere throughout the brain. Old left parietal cortical  infarction.  Severe focal stenosis of the left petrous carotid, 90% or greater. Possible filling defect distal to that which could be intraluminal thrombus or signal loss from severe turbulence. Stenoses in the anterior circulation intracranial branches, left worse than right. On the left, these could be atherosclerotic stenoses or conceivably due to nonocclusive embolic disease.  Chronically occluded right vertebral.  Critical Value/emergent results were called by telephone at the time of interpretation on 01/22/2015 at 2:42 pm to Dr. Joseph Berkshire ; Jim Like , who verbally acknowledged these results.   Electronically Signed   By: Nelson Chimes M.D.   On: 01/22/2015 14:44    Labs:  CBC:  Recent Labs  06/17/14 1114 06/19/14 0416 01/22/15 0853 01/22/15 0905  WBC 6.5 8.2 6.7  --   HGB 12.9* 13.0 12.2* 13.3  HCT 38.9* 39.4 37.0* 39.0  PLT 312 334 338  --     COAGS:  Recent Labs  06/18/14 0359 01/22/15 0853  INR 1.07 1.07  APTT  --  29    BMP:  Recent Labs  06/17/14 1114 06/18/14 0359 06/19/14 0416 06/27/14 1316  01/22/15 0853 01/22/15 0905  NA 139 139 139 141 138 139  K 4.2 3.8 4.2 5.0 3.9 3.8  CL 106 105 106 105 106 104  CO2 24 27 25 22 24   --   GLUCOSE 180* 96 162* 155* 199* 202*  BUN 23 20 19  28* 19 22*  CALCIUM 9.0 9.1 9.3 9.2 9.2  --   CREATININE 1.02 0.91 1.19 1.28 0.90 0.80  GFRNONAA 67* 77* 55*  --  >60  --   GFRAA 77* 90* 64*  --  >60  --     LIVER FUNCTION TESTS:  Recent Labs  06/27/14 1316 01/22/15 0853  BILITOT 0.6 0.7  AST 18 19  ALT 17 15*  ALKPHOS 63 52  PROT 6.3 6.1*  ALBUMIN 4.3 3.9    TUMOR MARKERS: No results for input(s): AFPTM, CEA, CA199, CHROMGRNA in the last 8760 hours.  Assessment and Plan:  Rt sided weakness Rt hand without use New finding of L ICA stenosis on MRI/MRA Now scheduled for cerebral arteriogram 9/7 Risks and Benefits discussed with the patient including, but not limited to bleeding, infection, vascular injury, contrast induced renal failure, stroke or even death. All of the patient's questions were answered, patient is agreeable to proceed. Consent signed and in chart.  Thank you for this interesting consult.  I greatly enjoyed meeting [name] and look forward to participating in their care.  A copy of this report was sent to the requesting provider on this date.  Signed: Kodee Drury A 01/22/2015, 4:12 PM   I spent a total of 40 Minutes    in face to face in clinical consultation, greater than 50% of which was counseling/coordinating care for cerebral arteriogram

## 2015-01-23 ENCOUNTER — Inpatient Hospital Stay (HOSPITAL_COMMUNITY): Payer: Medicare Other

## 2015-01-23 ENCOUNTER — Encounter (HOSPITAL_COMMUNITY): Payer: Self-pay | Admitting: Certified Registered Nurse Anesthetist

## 2015-01-23 ENCOUNTER — Inpatient Hospital Stay (HOSPITAL_COMMUNITY): Payer: PRIVATE HEALTH INSURANCE

## 2015-01-23 DIAGNOSIS — I63232 Cerebral infarction due to unspecified occlusion or stenosis of left carotid arteries: Principal | ICD-10-CM

## 2015-01-23 DIAGNOSIS — E785 Hyperlipidemia, unspecified: Secondary | ICD-10-CM

## 2015-01-23 DIAGNOSIS — E1159 Type 2 diabetes mellitus with other circulatory complications: Secondary | ICD-10-CM

## 2015-01-23 DIAGNOSIS — I6789 Other cerebrovascular disease: Secondary | ICD-10-CM

## 2015-01-23 DIAGNOSIS — I6522 Occlusion and stenosis of left carotid artery: Secondary | ICD-10-CM

## 2015-01-23 DIAGNOSIS — I10 Essential (primary) hypertension: Secondary | ICD-10-CM

## 2015-01-23 LAB — GLUCOSE, CAPILLARY
GLUCOSE-CAPILLARY: 194 mg/dL — AB (ref 65–99)
Glucose-Capillary: 77 mg/dL (ref 65–99)
Glucose-Capillary: 114 mg/dL — ABNORMAL HIGH (ref 65–99)
Glucose-Capillary: 97 mg/dL (ref 65–99)
Glucose-Capillary: 267 mg/dL — ABNORMAL HIGH (ref 65–99)

## 2015-01-23 LAB — HEMOGLOBIN A1C
Hgb A1c MFr Bld: 7.8 % — ABNORMAL HIGH (ref 4.8–5.6)
Mean Plasma Glucose: 177 mg/dL

## 2015-01-23 LAB — BASIC METABOLIC PANEL
ANION GAP: 8 (ref 5–15)
BUN: 17 mg/dL (ref 6–20)
CALCIUM: 9.2 mg/dL (ref 8.9–10.3)
CO2: 26 mmol/L (ref 22–32)
CREATININE: 1.04 mg/dL (ref 0.61–1.24)
Chloride: 104 mmol/L (ref 101–111)
GFR calc Af Amer: >60 mL/min (ref >60)
GLUCOSE: 117 mg/dL — AB (ref 65–99)
Potassium: 3.8 mmol/L (ref 3.5–5.1)
Sodium: 138 mmol/L (ref 135–145)

## 2015-01-23 LAB — CBC
HEMATOCRIT: 39.1 % (ref 39.0–52.0)
HEMOGLOBIN: 12.9 g/dL — AB (ref 13.0–17.0)
MCH: 29 pg (ref 26.0–34.0)
MCHC: 33 g/dL (ref 30.0–36.0)
MCV: 87.9 fL (ref 78.0–100.0)
Platelets: 354 10*3/uL (ref 150–400)
RBC: 4.45 MIL/uL (ref 4.22–5.81)
RDW: 14.9 % (ref 11.5–15.5)
WBC: 7 10*3/uL (ref 4.0–10.5)

## 2015-01-23 LAB — HIV ANTIBODY (ROUTINE TESTING W REFLEX): HIV SCREEN 4TH GENERATION: NONREACTIVE

## 2015-01-23 LAB — HEPARIN LEVEL (UNFRACTIONATED)
Heparin Unfractionated: 0.36 IU/mL (ref 0.30–0.70)
Heparin Unfractionated: 0.49 IU/mL (ref 0.30–0.70)

## 2015-01-23 MED ORDER — NIMODIPINE 30 MG PO CAPS
0.0000 mg | ORAL_CAPSULE | ORAL | Status: AC
  Administered 2015-01-24: 60 mg via ORAL
  Filled 2015-01-23: qty 2

## 2015-01-23 MED ORDER — CEFAZOLIN SODIUM-DEXTROSE 2-3 GM-% IV SOLR
2.0000 g | INTRAVENOUS | Status: DC
  Filled 2015-01-23 (×3): qty 50

## 2015-01-23 MED ORDER — HEPARIN SOD (PORK) LOCK FLUSH 100 UNIT/ML IV SOLN
INTRAVENOUS | Status: AC | PRN
  Administered 2015-01-23: 1000 [IU] via INTRAVENOUS

## 2015-01-23 MED ORDER — HEPARIN SOD (PORK) LOCK FLUSH 100 UNIT/ML IV SOLN
INTRAVENOUS | Status: AC
  Filled 2015-01-23: qty 20

## 2015-01-23 MED ORDER — IOHEXOL 300 MG/ML  SOLN
150.0000 mL | Freq: Once | INTRAMUSCULAR | Status: DC | PRN
  Administered 2015-01-23: 75 mL via INTRA_ARTERIAL
  Filled 2015-01-23: qty 150

## 2015-01-23 MED ORDER — CLOPIDOGREL BISULFATE 75 MG PO TABS
75.0000 mg | ORAL_TABLET | Freq: Every day | ORAL | Status: DC
  Administered 2015-01-24: 75 mg via ORAL
  Filled 2015-01-23 (×2): qty 1

## 2015-01-23 MED ORDER — CEFAZOLIN SODIUM-DEXTROSE 2-3 GM-% IV SOLR: 2.0000 g | INTRAVENOUS | Status: DC

## 2015-01-23 MED ORDER — LIDOCAINE HCL 1 % IJ SOLN
INTRAMUSCULAR | Status: AC
  Filled 2015-01-23: qty 20

## 2015-01-23 MED ORDER — MIDAZOLAM HCL 2 MG/2ML IJ SOLN
INTRAMUSCULAR | Status: AC | PRN
  Administered 2015-01-23: 1 mg via INTRAVENOUS

## 2015-01-23 MED ORDER — ASPIRIN EC 325 MG PO TBEC: 325.0000 mg | DELAYED_RELEASE_TABLET | ORAL | Status: AC

## 2015-01-23 MED ORDER — SODIUM CHLORIDE 0.9 % IV SOLN
INTRAVENOUS | Status: DC
  Administered 2015-01-23: 21:00:00 via INTRAVENOUS

## 2015-01-23 MED ORDER — SODIUM CHLORIDE 0.9 % IV SOLN: Freq: Once | INTRAVENOUS | Status: DC

## 2015-01-23 MED ORDER — SODIUM CHLORIDE 0.9 % IV SOLN
INTRAVENOUS | Status: AC | PRN
  Administered 2015-01-23: 75 mL/h via INTRAVENOUS

## 2015-01-23 MED ORDER — MIDAZOLAM HCL 2 MG/2ML IJ SOLN
INTRAMUSCULAR | Status: AC
  Filled 2015-01-23: qty 2

## 2015-01-23 MED ORDER — CLOPIDOGREL BISULFATE 300 MG PO TABS
300.0000 mg | ORAL_TABLET | Freq: Once | ORAL | Status: AC
  Administered 2015-01-23: 300 mg via ORAL
  Filled 2015-01-23 (×2): qty 1

## 2015-01-23 MED ORDER — ASPIRIN 325 MG PO TABS
325.0000 mg | ORAL_TABLET | Freq: Every day | ORAL | Status: DC
  Administered 2015-01-24: 325 mg via ORAL
  Filled 2015-01-23 (×2): qty 1

## 2015-01-23 MED ORDER — HYDRALAZINE HCL 20 MG/ML IJ SOLN
INTRAMUSCULAR | Status: AC
  Filled 2015-01-23: qty 1

## 2015-01-23 MED ORDER — FENTANYL CITRATE (PF) 100 MCG/2ML IJ SOLN
INTRAMUSCULAR | Status: AC
  Filled 2015-01-23: qty 2

## 2015-01-23 MED ORDER — FENTANYL CITRATE (PF) 100 MCG/2ML IJ SOLN
INTRAMUSCULAR | Status: AC | PRN
  Administered 2015-01-23: 25 ug via INTRAVENOUS

## 2015-01-23 NOTE — Progress Notes (Signed)
[place] for Heparin Indication: carotid stenosis  Allergies  Allergen Reactions  . Atorvastatin     Myalgias  . Crestor [Rosuvastatin]   . Other Other (See Comments)    No  BLOOD PRODUCTS - Pt is Jeohovah's Witness  . Pravastatin     Myalgias  . Simvastatin     Myalgias     Patient Measurements: Height: 6\' 1"  (185.4 cm) Weight: 222 lb 0.1 oz (100.7 kg) IBW/kg (Calculated) : 79.9  Vital Signs: Temp: 97.9 F (36.6 C) (09/06 1900) Temp Source: Oral (09/06 1900) BP: 154/76 mmHg (09/06 2300) Pulse Rate: 57 (09/06 2300)  Labs:  Recent Labs  01/22/15 0853 01/22/15 0905 01/23/15 0033  HGB 12.2* 13.3 12.9*  HCT 37.0* 39.0 39.1  PLT 338  --  354  APTT 29  --   --   LABPROT 14.1  --   --   INR 1.07  --   --   HEPARINUNFRC  --   --  0.36  CREATININE 0.90 0.80 1.04    Estimated Creatinine Clearance: 69.5 mL/min (by C-G formula based on Cr of 1.04).   Assessment: 81 y.o. male with carotid stenosis, possible thrombus, awaiting cerebral arteriogram, for heparin   Goal of Therapy:  Heparin level 0.3-0.5 Monitor platelets by anticoagulation protocol: Yes   Plan:  Continue Heparin at current rate  F/U after arteriogram  Antoinett Dorman, [place] Curb 01/23/2015,1:16 AM

## 2015-01-23 NOTE — Sedation Documentation (Signed)
Pt transferred to 3M2 with IR RN's on monitor. Dressing viewed with Margaretha Sheffield RN, dressing clean, dry, and intact

## 2015-01-23 NOTE — Progress Notes (Signed)
[place] for Heparin Indication: carotid stenosis  Allergies  Allergen Reactions  . Atorvastatin     Myalgias  . Crestor [Rosuvastatin]   . Other Other (See Comments)    No  BLOOD PRODUCTS - Pt is [employer]ah's Witness  . Pravastatin     Myalgias  . Simvastatin     Myalgias     Patient Measurements: Height: 6\' 1"  (185.4 cm) Weight: 222 lb 0.1 oz (100.7 kg) IBW/kg (Calculated) : 79.9  Vital Signs: Temp: 97.7 F (36.5 C) (09/07 0849) Temp Source: Oral (09/07 0849) BP: 158/74 mmHg (09/07 0800) Pulse Rate: 50 (09/07 0800)  Labs:  Recent Labs  01/22/15 0853 01/22/15 0905 01/23/15 0033 01/23/15 0815  HGB 12.2* 13.3 12.9*  --   HCT 37.0* 39.0 39.1  --   PLT 338  --  354  --   APTT 29  --   --   --   LABPROT 14.1  --   --   --   INR 1.07  --   --   --   HEPARINUNFRC  --   --  0.36 0.49  CREATININE 0.90 0.80 1.04  --     Estimated Creatinine Clearance: 69.5 mL/min (by C-G formula based on Cr of 1.04).   Assessment: 81 y.o. male with carotid stenosis, possible thrombus, awaiting cerebral arteriogram continues on heparin. Level remains therapeutic at 0.49 this AM. No bleeding noted. Arterigram planned for today.    Goal of Therapy:  Heparin level 0.3-0.5 Monitor platelets by anticoagulation protocol: Yes   Plan:  - Continue heparin gtt at 1200 units/hr  - F/u post-arteriogram  Salome Arnt, PharmD, BCPS Pager # [phone] 01/23/2015 9:19 AM

## 2015-01-23 NOTE — Progress Notes (Signed)
STROKE TEAM PROGRESS NOTE   SUBJECTIVE (INTERVAL HISTORY) No family is at the bedside.  Overall he feels his condition is unchanged. Still has right hand weakness and numbness. He had left MCA stroke in 2013 but no vessel imaging available. He had MRA neck in 2015 for dizziness showing left MCA petrous portion high grade stenosis. However, there was no MRI to determine stroke. He came in this time with left MCA/ACA, MCA/PCA watershed infarcts likely due to left ICA stenosis. He denies any hypotension at home and he drinks plenty of water. He stated his BP at home 160-180s. He did not have any dizziness at home. During admission, his BP also high at 180s.    OBJECTIVE Temp:  [97.6 F (36.4 C)-98.3 F (36.8 C)] 98.1 F (36.7 C) (09/07 0500) Pulse Rate:  [48-73] 50 (09/07 0800) Cardiac Rhythm:  [-] Sinus bradycardia (09/07 0800) Resp:  [11-23] 11 (09/07 0800) BP: (136-200)/(54-96) 158/74 mmHg (09/07 0800) SpO2:  [90 %-100 %] 96 % (09/07 0800) Weight:  [100.7 kg (222 lb 0.1 oz)] 100.7 kg (222 lb 0.1 oz) (09/06 1700)  CBC:  Recent Labs Lab 01/22/15 0853 01/22/15 0905 01/23/15 0033  WBC 6.7  --  7.0  NEUTROABS 4.2  --   --   HGB 12.2* 13.3 12.9*  HCT 37.0* 39.0 39.1  MCV 88.9  --  87.9  PLT 338  --  [id number]    Basic Metabolic Panel:  Recent Labs Lab 01/22/15 0853 01/22/15 0854 01/22/15 0905 01/23/15 0033  NA 138  --  139 138  K 3.9  --  3.8 3.8  CL 106  --  104 104  CO2 24  --   --  26  GLUCOSE 199*  --  202* 117*  BUN 19  --  22* 17  CREATININE 0.90  --  0.80 1.04  CALCIUM 9.2  --   --  9.2  MG  --  2.0  --   --     Lipid Panel:    Component Value Date/Time   CHOL 110 01/22/2015 1457   TRIG 24 01/22/2015 1457   HDL 44 01/22/2015 1457   CHOLHDL 2.5 01/22/2015 1457   VLDL 5 01/22/2015 1457   LDLCALC 61 01/22/2015 1457   HgbA1c:  Lab Results  Component Value Date   HGBA1C 7.8* 01/22/2015   Urine Drug Screen:    Component Value Date/Time   LABOPIA NONE DETECTED  01/22/2015 1031   COCAINSCRNUR NONE DETECTED 01/22/2015 1031   LABBENZ NONE DETECTED 01/22/2015 1031   AMPHETMU NONE DETECTED 01/22/2015 1031   THCU NONE DETECTED 01/22/2015 1031   LABBARB NONE DETECTED 01/22/2015 1031      IMAGING  Dg Chest 2 View 01/22/2015    No active cardiopulmonary disease.     Ct Head Wo Contrast 01/22/2015    1. No acute intracranial pathology. 2. Chronic microvascular disease and cerebral atrophy.     Ct Angio Head & Neck W/cm &/or Wo/cm 01/22/2015    High-grade stenosis of the skullbase LEFT ICA at the junction of the horizontal petrous and inferior cavernous segments estimated 90%. No intraluminal filling defect to suggest thrombus. The MR finding of a possible filling defect in the LEFT ICA relates to turbulent flow.  Chronically occluded RIGHT vertebral artery.  Greater than 50% stenosis proximal LEFT ICA, not clearly flow reducing. This tandem lesion does further compromise flow to the LEFT anterior circulation.     Mri & Mra Head Wo Contrast 01/22/2015  Punctate foci of acute infarctions scattered throughout the anterior circulation distribution on the left consistent with micro embolic infarctions. Some of these could be watershed.  Chronic small vessel ischemic changes elsewhere throughout the brain. Old left parietal cortical infarction.  Severe focal stenosis of the left petrous carotid, 90% or greater. Possible filling defect distal to that which could be intraluminal thrombus or signal loss from severe turbulence. Stenoses in the anterior circulation intracranial branches, left worse than right. On the left, these could be atherosclerotic stenoses or conceivably due to nonocclusive embolic disease.  Chronically occluded right vertebral.   2D echo - pending  Cerebral angio - pending    PHYSICAL EXAM  Temp:  [97.6 F (36.4 C)-98.3 F (36.8 C)] 97.7 F (36.5 C) (09/07 0849) Pulse Rate:  [48-73] 50 (09/07 0800) Resp:  [11-23] 11 (09/07 0800) BP:  (136-200)/(54-96) 158/74 mmHg (09/07 0800) SpO2:  [90 %-100 %] 96 % (09/07 0800) Weight:  [222 lb 0.1 oz (100.7 kg)] 222 lb 0.1 oz (100.7 kg) (09/06 1700)  General - Well nourished, well developed, in no apparent distress.  Ophthalmologic - Fundi not visualized due to small pupils.  Cardiovascular - Regular rate and rhythm with no murmur.  Mental Status -  Level of arousal and orientation to time, place, and person were intact. Language including expression, naming, repetition, comprehension was assessed and found intact. Fund of Knowledge was assessed and was intact.  Cranial Nerves II - XII - II - Visual field intact OU. III, IV, VI - Extraocular movements intact. V - Facial sensation intact bilaterally. VII - right nasolabial fold flattening. VIII - Hearing & vestibular intact bilaterally. X - Palate elevates symmetrically. XI - Chin turning & shoulder shrug intact bilaterally. XII - Tongue protrusion intact.  Motor Strength - The patient's strength was normal in all extremities except 0/5 right hand grip, wrist extension and flexion and pronator drift was absent.  Bulk was normal and fasciculations were absent.   Motor Tone - Muscle tone was assessed at the neck and appendages and was normal.  Reflexes - The patient's reflexes were 1+ in all extremities and he had no pathological reflexes.  Sensory - Light touch, temperature/pinprick were assessed and were symmetrical except decreased sensation distal to right wrist.    Coordination - The patient had normal movements in the hands and feet with no ataxia or dysmetria.  Tremor was absent.  Gait and Station - not tested due safety concerns.   ASSESSMENT/PLAN [name] is a 81 y.o. male with history of CAD, HTN, DM, HLD presenting with 2 day history of right sided weakness. He did not receive IV t-PA due to delay in arrival . MRI showed left MCA/PCA and MCA/ACA watershed infarcts, consistent with left petrous portion  ICA high grade stenosis. Pt had no hypotension episodes. CTA head and neck ruled out thrombosis distal to stenosis segment.   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 Pt had left MCA stroke in 2013 and had dizziness episode in 2015. Pt has had no hypotension episodes. Combination of the fact of recurrent left MCA stroke and chronic HTN running in 180s, agree with cerebral angio with potential intervention.   Resultant  Right hand weakness and numbenss  MRI  left punctate scattered infarctions in a watershed distribution. Old L parietal cortical infarct.  MRA  Focal L petrous stenosis 90%. Chronic occluded L VA  CTA head and neck - no thrombosis distal to left petrous ICA stenosis  2D Echo  pending   Cerebral angio today with possible intervention  LDL 61  HgbA1c 7.8, not at goal  IV heparin for VTE prophylaxis  Diet NPO time specified Except for: Sips with Meds  clopidogrel 75 mg orally every day prior to admission, now on heparin. From stroke standpoint, no heparin needed. Will stop one hour before cerebral angio and then will decide on antiplatelet after the cerebral angio.  Ongoing aggressive stroke risk factor management  Therapy recommendations:  pending   Disposition:  pending   Left petrous ICA high grade stenosis  Likely the culprit for at least two left hemisphere strokes  Still has watershed infarct this time in the setting of chronic hypertension 180s  Agree with cerebral angio with potential intervention at this time  No thrombosis on CTA head distal to stenosis  No heparin needed from stroke standpoint. Will stop one hour prior to cerebral angio  Antiplatelet regimen will be determined after angio.   Essential Hypertension  No signs of hypotension on/before arrival  Chronic hypertension 160=180s BP goal < 160 post intervention if indicated. No  BP meds for now.  Permissive hypertension (OK if <220/120) prior to procedure.  Hyperlipidemia  Home meds:  crestor 5 mg daily, resumed in hospital  LDL 61, goal < 70  Continue statin at discharge  Diabetes type II  HgbA1c 7.8, goal < 7.0  Uncontrolled  SSI  CBG monitoring  Other Stroke Risk Factors  Advanced age  Former Cigarette smoker, quit smoking 57 years ago   Hx stroke/TIA - 2013 w/ right hand weakness  Coronary artery disease - non-STEMI, CABG x 2  Post op atrial fibrillation   Obstructive sleep apnea, on CPAP at home  AAA  Other Active Problems  On heparin drip for now  Hospital day # 1  This patient is critically ill due to critical high grade ICA stenosis with watershed infarcts and at significant risk of neurological worsening, death form recurrent infarcts, ICA occlusion, hemorrhagic infarct due to heparin drip and complications from intervention. This patient's care requires constant monitoring of vital signs, hemodynamics, respiratory and cardiac monitoring, review of multiple databases, neurological assessment, discussion with family, other specialists and medical decision making of high complexity. I spent 40 minutes of neurocritical care time in the care of this patient.   Rosalin Hawking, MD PhD Stroke Neurology 01/23/2015 9:43 AM      To contact Stroke Continuity provider, please refer to [email or web address] After hours, contact General Neurology

## 2015-01-23 NOTE — Evaluation (Signed)
Physical Therapy Evaluation Patient Details Name: [name] MRN: [id number] DOB: Jul 21, 1930 Today's Date: 01/23/2015   History of Present Illness  [name] is an 81 y.o. male hx of CAD, HTN, DM, HLD presenting with 2 day history of right sided weakness. Patient notes waking up and feeling like his RUE was asleep with a pins and needles sensation. Notes difficulty moving his hand and grasping items. Symptoms have not improved so he decided to come to the ED  Clinical Impression  Pt is at or close to baseline functioning and should be safe at home with limited available assist. There are no further acute PT needs.  Will sign off at this time.     Follow Up Recommendations No PT follow up    Equipment Recommendations  None recommended by PT    Recommendations for Other Services       Precautions / Restrictions Precautions Precautions: None      Mobility  Bed Mobility Overal bed mobility: Independent                Transfers Overall transfer level: Independent                  Ambulation/Gait Ambulation/Gait assistance: Independent Ambulation Distance (Feet): 350 Feet Assistive device: None Gait Pattern/deviations: Step-through pattern   Gait velocity interpretation: >2.62 ft/sec, indicative of independent community ambulator General Gait Details: steady with age appropriate speed.  Stairs Stairs: Yes Stairs assistance: Supervision Stair Management: One rail Left;Step to pattern;Forwards Number of Stairs: 5 General stair comments: safe with assist of rail  Wheelchair Mobility    Modified Rankin (Stroke Patients Only) Modified Rankin (Stroke Patients Only) Pre-Morbid Rankin Score: No significant disability Modified Rankin: Moderate disability     Balance Overall balance assessment: No apparent balance deficits (not formally assessed)                               Standardized Balance Assessment Standardized Balance  Assessment : Dynamic Gait Index   Dynamic Gait Index Level Surface: Normal Change in Gait Speed: Normal Gait with Horizontal Head Turns: Normal Gait with Vertical Head Turns: Normal Gait and Pivot Turn: Normal Step Over Obstacle: Mild Impairment Step Around Obstacles: Mild Impairment Steps: Mild Impairment Total Score: 21       Pertinent Vitals/Pain Pain Assessment: No/denies pain    Home Living Family/patient expects to be discharged to:: Private residence Living Arrangements: Spouse/significant other;Other (Comment) (31 y/o mother in law) Available Help at Discharge: Other (Comment) (Dtr PRN) Type of Home: House Home Access: Stairs to enter Entrance Stairs-Rails: Right;Left Entrance Stairs-Number of Steps: several Home Layout: Two level;Other (Comment) (wife and Mom in law on 1st, pt on second floor) Home Equipment: Walker - 2 wheels;Cane - single point;Bedside commode;Shower seat;Wheelchair - Education officer, community - power      Prior Function Level of Independence: Independent               Hand Dominance   Dominant Hand: Left    Extremity/Trunk Assessment   Upper Extremity Assessment:  (isolated R wrist flexion 3+/5, 0/5 ext, no overt finger move)           Lower Extremity Assessment: Overall WFL for tasks assessed (R generally weaker than L)         Communication   Communication: No difficulties  Cognition Arousal/Alertness: Awake/alert Behavior During Therapy: WFL for tasks assessed/performed Overall Cognitive Status: Within Functional Limits for tasks  assessed                      General Comments      Exercises        Assessment/Plan    PT Assessment Patent does not need any further PT services  PT Diagnosis     PT Problem List    PT Treatment Interventions     PT Goals (Current goals can be found in the Care Plan section) Acute Rehab PT Goals PT Goal Formulation: All assessment and education complete, DC therapy     Frequency     Barriers to discharge        Co-evaluation               End of Session   Activity Tolerance: Patient tolerated treatment well Patient left: in chair;with call bell/phone within reach Nurse Communication: Mobility status         Time: [id number] PT Time Calculation (min) (ACUTE ONLY): 23 min   Charges:   PT Evaluation $Initial PT Evaluation Tier I: 1 Procedure PT Treatments $Gait Training: 8-22 mins   PT G Codes:        Dixie Jafri, Tessie Fass 01/23/2015, 10:59 AM 01/23/2015  Donnella Sham, PT [phone]  (pager)

## 2015-01-23 NOTE — Sedation Documentation (Signed)
Patient denies pain and is resting comfortably.  

## 2015-01-23 NOTE — Sedation Documentation (Signed)
Patient is resting comfortably. 

## 2015-01-23 NOTE — Progress Notes (Signed)
Got verbal order from Dr. Keturah Barre for asprin and plavix.

## 2015-01-23 NOTE — Progress Notes (Signed)
  Echocardiogram 2D Echocardiogram has been performed.  Jennette Dubin 01/23/2015, 9:49 AM

## 2015-01-23 NOTE — Procedures (Signed)
S/P bilateral common carotid and Lt Vertebral arteriograms. RT CFA approach. Findings. 1.>95 % stenosis Lt  ICA petrous segment with post stenotic dilatation. 2.Approx 60% stenosis LT ICA supraclinoid segment. 3.approx 50 to 70 stnosis of RT ACA A1 segment. 4.Occluded RT VA

## 2015-01-23 NOTE — Progress Notes (Signed)
Still waiting on 300mg  plavix pill from pharmacy. Have called and notified in EPIC. Was told by pharmacy not to give four of the 75mg  tabs from [facility].

## 2015-01-23 NOTE — Care Management Note (Signed)
Case Management Note  Patient Details  Name: RC SCHIRALDI MRN: [id number] Date of Birth: 07-Nov-1930  Subjective/Objective:  Pt s/p CVA on 01/22/15 with endovascular intervention.  PTA, pt independent, lives with spouse.                    Action/Plan: Will follow for discharge planning as pt progresses.    Expected Discharge Date:                  Expected Discharge Plan:  [place]  In-House Referral:     Discharge planning Services  CM Consult  Post Acute Care Choice:    Choice offered to:     DME Arranged:    DME Agency:     HH Arranged:    [place] Agency:     Status of Service:  In process, will continue to follow  Medicare Important Message Given:    Date Medicare IM Given:    Medicare IM give by:    Date Additional Medicare IM Given:    Additional Medicare Important Message give by:     If discussed at [place] of Stay Meetings, dates discussed:    Additional Comments:  Reinaldo Raddle, RN, BSN  Trauma/Neuro ICU Case Manager [phone]

## 2015-01-23 NOTE — Evaluation (Signed)
Speech Language Pathology Evaluation Patient Details Name: [name] MRN: [id number] DOB: 01/04/1931 Today's Date: 01/23/2015 Time: [id number] SLP Time Calculation (min) (ACUTE ONLY): 22 min  Problem List:  Patient Active Problem List   Diagnosis Date Noted  . CVA (cerebral infarction) 01/22/2015  . CAD S/P percutaneous coronary angioplasty 09/19/2014  . Coronary artery disease involving coronary bypass graft of native heart with other forms of angina pectoris 09/19/2014  . Shortness of breath 09/19/2014  . Polypharmacy 09/19/2014  . Abdominal aortic aneurysm   . Stroke   . Hyperlipidemia LDL goal <70   . Type II diabetes mellitus with complication   . Coronary artery disease involving native coronary artery   . Abnormal TSH 06/19/2014  . Obesity 06/19/2014  . Bilateral lower extremity edema 03/30/2014  . Dizziness 02/21/2014  . Bradycardia 08/31/2013  . Postoperative atrial fibrillation     Class: Temporary  . Diabetes mellitus type 2 in obese   . Moderate essential hypertension   . Apnea, sleep   . Balanitis xerotica obliterans   . History of: Non-STEMI (non-ST elevated myocardial infarction) 11/16/2010    Class: History of  . S/P CABG x 2 11/16/2010   Past Medical History:  Past Medical History  Diagnosis Date  . Non-STEMI (non-ST elevated myocardial infarction) July 2012    1. Ostial LAD 70% (to close to [facility] for PCI), subtotal occlusion of the RCA  . CAD in native artery     a. NSTEMI 11/2010 - CABG x2(LIMA to LAD, SVG to PDA-11/2010). b. NEGATIVE Lexiscan Myoview 6/9/'15, EF 53%, no perfusion abnormality, septal and apical HK noted. C. NSTEMI 05/2014 - s/p DES to SVG-RPDA 06/18/14 (Xience Alpine DES 3.0 x 18 mm -3.35 mm), EF 60-65%.  . 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 Servando Snare)  . Postoperative atrial fibrillation     Post CABG, no sign recurrence  . HTN (hypertension)   . Type II diabetes mellitus with complication   . Hyperlipidemia LDL goal <70    . 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 Refusal of blood transfusions as patient is Jehovah's Witness   . Sleep apnea   . Abdominal aortic aneurysm     a. Aortic duplex 06/2014: mild aneurysmal dilatation of proximal abdominal aorta measuring 3.4x3.4cm. No sig change from 2012. F/u due 06/2016.  . Dizziness     a. Carotid duplex 03/2014: mild fibrous plaque, no significant stenosis.  . Stroke     a. 2014 with mild right hand weakness, nonhemorrhagic per pt.   Past Surgical History:  Past Surgical History  Procedure Laterality Date  . Cardiac catheterization  12/11/2010    Dr. Chase Picket - subsequent cath - normal LV systolic function, no renal artery stenosis, severe 2-vessel disease with subtotaled RCA prox and distal 60% lesion and complex 70% area of narrowing of ostium of LAD  . Coronary artery bypass graft  12/15/2010    X2, Dr Servando Snare; LIMA to LAD, SVG to PDA;   Marland Kitchen Cystoscopy with urethral dilatation    . Transthoracic echocardiogram  07/28/2012    EF 55-60%, severe Conc LVH, Nl Systolic fxn, G1 DD,  Ao Sclerosis w/o stenosis; CRO PFO  . Left heart catheterization with coronary angiogram N/A 06/18/2014    Procedure: LEFT HEART CATHETERIZATION WITH CORONARY ANGIOGRAM;  Surgeon: Leonie Man, MD;  Location: [facility] CATH LAB;  Service: Cardiovascular;  Laterality: N/A;   HPI:  81 y.o. male with hx of HTN, HLD, DM, CAD presenting with 2 day history of right sided weakness mainly involving his distal RUE.  MRI: Punctate foci of acute infarctions scattered throughout the anterior circulation distribution on the left consistent with micro embolic infarctions. Some of these could be watershed.  Underwent cerebral arteriogram 9/7   Assessment / Plan / Recommendation Clinical Impression  Pt presents with mild short-term memory deficits that are c/w baseline per his report; speech, language, fluency, and executive functioning are [facility].  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  Affirms some stress with these responsibilities; has some support from daughter who lives forty minutes away.  There are no changes requiring SLP f/u - will sign off.    SLP Assessment  Patient does not need any further Speech Lanaguage Pathology Services    Follow Up Recommendations  None    Frequency and Duration        Pertinent Vitals/Pain Pain Assessment: No/denies pain   SLP Goals     SLP Evaluation Prior Functioning  Cognitive/Linguistic Baseline: Baseline deficits Baseline deficit details: short-term memory Type of Home: House  Lives With: Spouse (mother-in-law) Available Help at Discharge: Other (Comment) (Dtr PRN) Vocation: Retired   Associate Professor  Overall Cognitive Status: Within Functional Limits for tasks assessed Orientation Level: Oriented X4 Attention: Selective Selective Attention: Appears intact Memory: Impaired Memory Impairment: Retrieval deficit (baseline) Awareness: Appears intact Problem Solving: Appears intact Executive Function: Reasoning Reasoning: Appears intact Safety/Judgment: Appears intact    Comprehension  Auditory Comprehension Overall Auditory Comprehension: Appears within functional limits for tasks assessed Visual Recognition/Discrimination Discrimination: Within Function Limits Reading Comprehension Reading Status: Within funtional limits    Expression Expression Primary Mode of Expression: Verbal Verbal Expression Overall Verbal Expression: Appears within functional limits for tasks assessed Written Expression Dominant Hand: Left   Oral / Motor Oral Motor/Sensory Function Overall Oral Motor/Sensory Function: Appears within functional limits for tasks assessed Motor Speech Overall Motor Speech: Appears within functional limits for tasks assessed   GO     [name] 01/23/2015, 1:32 PM

## 2015-01-23 NOTE — Progress Notes (Signed)
Patient ID: [name], male   DOB: [birth year], 81 y.o.   MRN: [id number] Pt s/p cerebral angio today revealing 1.>95 % stenosis Lt ICA petrous segment with post stenotic dilatation.2.Approx 60% stenosis LT ICA supraclinoid segment.3.approx 50 to 70 stnosis of RT ACA A1 segment.4.Occluded RT VA. He is currently stable and denies any new c/o. He is scheduled for left ICA PTA/stenting on 9/8. Details/risks of procedure, including but not limited to vascular injury/ internal bleeding, infection, contrast nephropathy,  stroke/TIA and anesthesia related complications d/w pt/daughter with their understanding and consent.

## 2015-01-24 ENCOUNTER — Encounter (HOSPITAL_COMMUNITY)
Admission: EM | Disposition: A | Discharge status: Home / Self Care | Payer: Self-pay | Source: Home / Self Care | Attending: Neurology

## 2015-01-24 ENCOUNTER — Inpatient Hospital Stay (HOSPITAL_COMMUNITY): Payer: Medicare Other | Admitting: Certified Registered Nurse Anesthetist

## 2015-01-24 ENCOUNTER — Ambulatory Visit (HOSPITAL_COMMUNITY): Payer: Medicare Other

## 2015-01-24 HISTORY — PX: RADIOLOGY WITH ANESTHESIA: SHX6223

## 2015-01-24 LAB — BASIC METABOLIC PANEL WITH GFR
Anion gap: 6 (ref 5–15)
BUN: 19 mg/dL (ref 6–20)
CO2: 26 mmol/L (ref 22–32)
Calcium: 8.7 mg/dL — ABNORMAL LOW (ref 8.9–10.3)
Chloride: 105 mmol/L (ref 101–111)
Creatinine, Ser: 0.98 mg/dL (ref 0.61–1.24)
GFR calc Af Amer: >60 mL/min
GFR calc non Af Amer: >60 mL/min
Glucose, Bld: 127 mg/dL — ABNORMAL HIGH (ref 65–99)
Potassium: 3.8 mmol/L (ref 3.5–5.1)
Sodium: 137 mmol/L (ref 135–145)

## 2015-01-24 LAB — CBC
HCT: 36.6 % — ABNORMAL LOW (ref 39.0–52.0)
Hemoglobin: 12.2 g/dL — ABNORMAL LOW (ref 13.0–17.0)
MCH: 29.1 pg (ref 26.0–34.0)
MCHC: 33.3 g/dL (ref 30.0–36.0)
MCV: 87.4 fL (ref 78.0–100.0)
PLATELETS: 333 10*3/uL (ref 150–400)
RBC: 4.19 MIL/uL — AB (ref 4.22–5.81)
RDW: 14.6 % (ref 11.5–15.5)
WBC: 6.4 10*3/uL (ref 4.0–10.5)

## 2015-01-24 LAB — PLATELET INHIBITION P2Y12: Platelet Function  P2Y12: 148 [PRU] — ABNORMAL LOW (ref 194–418)

## 2015-01-24 LAB — GLUCOSE, CAPILLARY
GLUCOSE-CAPILLARY: 135 mg/dL — AB (ref 65–99)
GLUCOSE-CAPILLARY: 188 mg/dL — AB (ref 65–99)
Glucose-Capillary: 174 mg/dL — ABNORMAL HIGH (ref 65–99)
Glucose-Capillary: 167 mg/dL — ABNORMAL HIGH (ref 65–99)
Glucose-Capillary: 171 mg/dL — ABNORMAL HIGH (ref 65–99)

## 2015-01-24 LAB — POCT ACTIVATED CLOTTING TIME
ACTIVATED CLOTTING TIME: 147 s
Activated Clotting Time: 164 seconds

## 2015-01-24 LAB — NO BLOOD PRODUCTS

## 2015-01-24 LAB — PROTIME-INR
INR: 1.08 (ref 0.00–1.49)
PROTHROMBIN TIME: 14.2 s (ref 11.6–15.2)

## 2015-01-24 LAB — APTT: APTT: 28 s (ref 24–37)

## 2015-01-24 SURGERY — RADIOLOGY WITH ANESTHESIA
Anesthesia: General

## 2015-01-24 MED ORDER — CLOPIDOGREL BISULFATE 75 MG PO TABS
75.0000 mg | ORAL_TABLET | Freq: Every day | ORAL | Status: DC
  Administered 2015-01-25 – 2015-01-26 (×2): 75 mg via ORAL
  Filled 2015-01-24 (×3): qty 1

## 2015-01-24 MED ORDER — NICARDIPINE HCL IN NACL 20-0.86 MG/200ML-% IV SOLN
5.0000 mg/h | INTRAVENOUS | Status: DC
  Administered 2015-01-24: 5 mg/h via INTRAVENOUS
  Administered 2015-01-24: 7.5 mg/h via INTRAVENOUS
  Administered 2015-01-24: 10 mg/h via INTRAVENOUS
  Administered 2015-01-24: 5 mg/h via INTRAVENOUS
  Administered 2015-01-25 (×4): 10 mg/h via INTRAVENOUS
  Filled 2015-01-24 (×7): qty 200

## 2015-01-24 MED ORDER — SUCCINYLCHOLINE CHLORIDE 20 MG/ML IJ SOLN
INTRAMUSCULAR | Status: DC | PRN
  Administered 2015-01-24: 140 mg via INTRAVENOUS

## 2015-01-24 MED ORDER — MIDAZOLAM HCL 5 MG/5ML IJ SOLN
INTRAMUSCULAR | Status: DC | PRN
  Administered 2015-01-24: 2 mg via INTRAVENOUS

## 2015-01-24 MED ORDER — LIDOCAINE HCL (CARDIAC) 20 MG/ML IV SOLN
INTRAVENOUS | Status: DC | PRN
  Administered 2015-01-24: 80 mg via INTRAVENOUS

## 2015-01-24 MED ORDER — ACETAMINOPHEN 650 MG RE SUPP: 650.0000 mg | Freq: Four times a day (QID) | RECTAL | Status: DC | PRN

## 2015-01-24 MED ORDER — NITROGLYCERIN 1 MG/10 ML FOR IR/CATH LAB
INTRA_ARTERIAL | Status: AC
  Filled 2015-01-24: qty 10

## 2015-01-24 MED ORDER — ONDANSETRON HCL 4 MG/2ML IJ SOLN
INTRAMUSCULAR | Status: DC | PRN
  Administered 2015-01-24: 4 mg via INTRAVENOUS

## 2015-01-24 MED ORDER — HEPARIN (PORCINE) IN NACL 100-0.45 UNIT/ML-% IJ SOLN
INTRAMUSCULAR | Status: AC
  Filled 2015-01-24: qty 250

## 2015-01-24 MED ORDER — NEOSTIGMINE METHYLSULFATE 10 MG/10ML IV SOLN
INTRAVENOUS | Status: DC | PRN
  Administered 2015-01-24: 5 mg via INTRAVENOUS

## 2015-01-24 MED ORDER — PROPOFOL 10 MG/ML IV BOLUS
INTRAVENOUS | Status: DC | PRN
  Administered 2015-01-24: 200 mg via INTRAVENOUS

## 2015-01-24 MED ORDER — FENTANYL CITRATE (PF) 100 MCG/2ML IJ SOLN
INTRAMUSCULAR | Status: DC | PRN
  Administered 2015-01-24: 50 ug via INTRAVENOUS
  Administered 2015-01-24: 100 ug via INTRAVENOUS

## 2015-01-24 MED ORDER — IOHEXOL 300 MG/ML  SOLN
200.0000 mL | Freq: Once | INTRAMUSCULAR | Status: DC | PRN
  Administered 2015-01-24: 80 mL via INTRAVENOUS
  Filled 2015-01-24: qty 200

## 2015-01-24 MED ORDER — GLYCOPYRROLATE 0.2 MG/ML IJ SOLN
INTRAMUSCULAR | Status: DC | PRN
  Administered 2015-01-24: .9 mg via INTRAVENOUS

## 2015-01-24 MED ORDER — ONDANSETRON HCL 4 MG/2ML IJ SOLN: 4.0000 mg | Freq: Once | INTRAMUSCULAR | Status: DC | PRN

## 2015-01-24 MED ORDER — HEPARIN (PORCINE) IN NACL 100-0.45 UNIT/ML-% IJ SOLN
900.0000 [IU]/h | INTRAMUSCULAR | Status: DC
  Administered 2015-01-24: 500 [IU]/h via INTRAVENOUS
  Filled 2015-01-24: qty 250

## 2015-01-24 MED ORDER — HEPARIN SODIUM (PORCINE) 1000 UNIT/ML IJ SOLN
INTRAMUSCULAR | Status: DC | PRN
  Administered 2015-01-24 (×2): .5 mL via INTRAVENOUS
  Administered 2015-01-24: 1 mL via INTRAVENOUS
  Administered 2015-01-24: 3 mL via INTRAVENOUS

## 2015-01-24 MED ORDER — ACETAMINOPHEN 500 MG PO TABS: 1000.0000 mg | ORAL_TABLET | Freq: Four times a day (QID) | ORAL | Status: DC | PRN

## 2015-01-24 MED ORDER — FENTANYL CITRATE (PF) 100 MCG/2ML IJ SOLN: 25.0000 ug | INTRAMUSCULAR | Status: DC | PRN

## 2015-01-24 MED ORDER — ASPIRIN 325 MG PO TABS
325.0000 mg | ORAL_TABLET | Freq: Every day | ORAL | Status: DC
  Administered 2015-01-25 – 2015-01-26 (×2): 325 mg via ORAL
  Filled 2015-01-24 (×3): qty 1

## 2015-01-24 MED ORDER — ROCURONIUM BROMIDE 100 MG/10ML IV SOLN
INTRAVENOUS | Status: DC | PRN
  Administered 2015-01-24: 50 mg via INTRAVENOUS

## 2015-01-24 MED ORDER — EPHEDRINE SULFATE 50 MG/ML IJ SOLN
INTRAMUSCULAR | Status: DC | PRN
  Administered 2015-01-24: 10 mg via INTRAVENOUS

## 2015-01-24 MED ORDER — CEFAZOLIN SODIUM-DEXTROSE 2-3 GM-% IV SOLR
INTRAVENOUS | Status: DC | PRN
  Administered 2015-01-24: 2 g via INTRAVENOUS

## 2015-01-24 MED ORDER — SODIUM CHLORIDE 0.9 % IV SOLN
10.0000 mg | INTRAVENOUS | Status: DC | PRN
  Administered 2015-01-24: 15 ug/min via INTRAVENOUS

## 2015-01-24 MED ORDER — SODIUM CHLORIDE 0.9 % IV SOLN
INTRAVENOUS | Status: DC
Start: 2015-01-24 — End: 2015-01-25
  Administered 2015-01-24 – 2015-01-25 (×2): via INTRAVENOUS

## 2015-01-24 MED ORDER — LACTATED RINGERS IV SOLN
INTRAVENOUS | Status: DC
  Administered 2015-01-24 (×3): via INTRAVENOUS

## 2015-01-24 MED ORDER — ONDANSETRON HCL 4 MG/2ML IJ SOLN: 4.0000 mg | Freq: Four times a day (QID) | INTRAMUSCULAR | Status: DC | PRN

## 2015-01-24 NOTE — Progress Notes (Signed)
22M called spoke with Margaretha Sheffield, informed her of plavix and Nimotop was given and that his procedure is now scheduled for 1200. Spoke with [name] , CRNA and informed of pt going back to the floor and that we will need to call for the patient when closer to scheduled time.

## 2015-01-24 NOTE — Progress Notes (Signed)
Referring Physician(s): Janann Colonel  Chief Complaint:  L ICA stenosis  Subjective:  L ICA angioplasty in IR with Dr Estanislado Pandy Doing well No complaints Neuro Intact  Allergies: Atorvastatin; Crestor; Other; Pravastatin; and Simvastatin  Medications: Prior to Admission medications   Medication Sig Start Date End Date Taking? Authorizing Provider  amLODipine (NORVASC) 10 MG tablet Take 10 mg by mouth every evening.    Yes Historical Provider, MD  Bioflavonoid Products (SUPER C-500) TABS Take 1 tablet by mouth every evening.    Yes Historical Provider, MD  bisacodyl (DULCOLAX) 5 MG EC tablet Take 1 tablet (5 mg total) by mouth daily as needed for moderate constipation. 06/27/14  Yes Dayna N Dunn, PA-C  clopidogrel (PLAVIX) 75 MG tablet Take 1 tablet (75 mg total) by mouth daily. 12/19/14  Yes Leonie Man, MD  docusate sodium (COLACE) 100 MG capsule Take 1 capsule (100 mg total) by mouth 2 (two) times daily. 06/27/14  Yes Dayna N Dunn, PA-C  doxazosin (CARDURA) 4 MG tablet Take 1 tablet by mouth daily. 03/27/14  Yes Historical Provider, MD  glimepiride (AMARYL) 4 MG tablet Take 4 mg by mouth 2 (two) times daily with a meal.    Yes Historical Provider, MD  hydrALAZINE (APRESOLINE) 100 MG tablet Take 1 tablet (100 mg total) by mouth 2 (two) times daily. 12/19/14  Yes Leonie Man, MD  labetalol (NORMODYNE) 200 MG tablet Take 0.5 tablets (100 mg total) by mouth 2 (two) times daily. 02/23/14  Yes Lorretta Harp, MD  losartan-hydrochlorothiazide (HYZAAR) 100-25 MG per tablet Take 1 tablet by mouth every morning.    Yes Historical Provider, MD  metFORMIN (GLUCOPHAGE) 1000 MG tablet Take 1 tablet by mouth 2 (two) times daily. 07/25/14  Yes Historical Provider, MD  nitroGLYCERIN (NITROSTAT) 0.4 MG SL tablet Place 1 tablet (0.4 mg total) under the tongue every 5 (five) minutes as needed for chest pain (up to 3 doses). 06/19/14  Yes Dayna N Dunn, PA-C  rosuvastatin (CRESTOR) 5 MG tablet Take 1 tablet  (5 mg total) by mouth 2 (two) times a week. Patient taking differently: Take 2.5 mg by mouth 2 (two) times a week.  06/27/14  Yes Dayna N Dunn, PA-C     Vital Signs: BP 119/54 mmHg  Pulse 53  Temp(Src) 96.8 F (36 C) (Oral)  Resp 14  Ht 6\' 1"  (1.854 m)  Wt 222 lb 0.1 oz (100.7 kg)  BMI 29.30 kg/m2  SpO2 93%  Physical Exam  Constitutional: He is oriented to person, place, and time.  Musculoskeletal: Normal range of motion.  Rt hand/arm weak---always Rt groin NT no bleeding No hematoma Rt foot 2+ pulses  Neurological: He is alert and oriented to person, place, and time.  Skin: Skin is warm and dry.  Psychiatric: He has a normal mood and affect. His behavior is normal. Judgment and thought content normal.  Nursing note and vitals reviewed.   Imaging: Ct Angio Head W/cm &/or Wo Cm  01/22/2015   CLINICAL DATA:  Two day history of right-sided weakness. Initial encounter.  EXAM: CT ANGIOGRAPHY HEAD AND NECK  TECHNIQUE: Multidetector CT imaging of the head and neck was performed using the standard protocol during bolus administration of intravenous contrast. Multiplanar CT image reconstructions and MIPs were obtained to evaluate the vascular anatomy. Carotid stenosis measurements (when applicable) are obtained utilizing NASCET criteria, using the distal internal carotid diameter as the denominator.  CONTRAST:  79mL OMNIPAQUE IOHEXOL 350 MG/ML SOLN  COMPARISON:  MR head and MRA head earlier today. Extracranial MRA 01/26/2014.  FINDINGS: CT HEAD  Calvarium and skull base: No fracture or destructive lesion. Mastoids and middle ears are grossly clear.  Paranasal sinuses: Imaged portions are clear.  Orbits: Negative.  Brain: No evidence of acute abnormality, including acute infarct, hemorrhage, hydrocephalus, or mass lesion. The prior acute infarcts noted on earlier today MRI are not well seen. There are chronic changes of an old LEFT parietal infarct redemonstrated.  CTA NECK  Aortic arch: Standard  branching. Imaged portion shows no evidence of aneurysm or dissection. No significant stenosis of the major arch vessel origins.  Right carotid system: Mild non stenotic calcific plaque at the bifurcation. No evidence of dissection, stenosis (50% or greater) or occlusion.  Left carotid system: Calcified, and noncalcified plaque, with slightly greater than 50% stenosis in the proximal LEFT internal carotid artery based on luminal measurements of 5.3 proximal/ distal. No evidence of dissection, or occlusion.  Vertebral arteries: RIGHT vertebral is chronically occluded, with only faint evidence of muscular collateral attempt through the neck. No continuous vessel. LEFT vertebral widely patent. No evidence of LEFT vertebral dissection, stenosis (50% or greater) or occlusion.  CTA HEAD  Anterior circulation: At the junction of the horizontal petrous and inferior cavernous segments on the LEFT, there is a high-grade stenosis, estimated 90%, similar to the appearance from the previous MRA scans. No intraluminal thrombus is evident. No similar lesion on the RIGHT. No significant stenosis, proximal occlusion, aneurysm, or vascular malformation.  Posterior circulation: LEFT vertebral is the dominant/ sole contributor to the basilar. Mild non stenotic irregularity of a calcific nature in its V4 segment. No significant stenosis, proximal occlusion, aneurysm, or vascular malformation.  Venous sinuses: As permitted by contrast timing, patent.  Anatomic variants: None of significance.  Delayed phase:   No abnormal intracranial enhancement.  IMPRESSION: High-grade stenosis of the skullbase LEFT ICA at the junction of the horizontal petrous and inferior cavernous segments estimated 90%. No intraluminal filling defect to suggest thrombus. The MR finding of a possible filling defect in the LEFT ICA relates to turbulent flow.  Chronically occluded RIGHT vertebral artery.  Greater than 50% stenosis proximal LEFT ICA, not clearly flow  reducing. This tandem lesion does further compromise flow to the LEFT anterior circulation.   Electronically Signed   By: Staci Righter M.D.   On: 01/22/2015 17:10   Dg Chest 2 View  01/22/2015   CLINICAL DATA:  81 year old male with possible stroke. No chest complaints.  EXAM: CHEST  2 VIEW  COMPARISON:  Chest radiograph dated 06/17/2014  FINDINGS: Bibasilar dependent subsegmental atelectatic changes noted. There is no focal consolidation, pleural effusion, or pneumothorax. Stable cardiac silhouette. Median sternotomy wires. The osseous structures are grossly unremarkable.  IMPRESSION: No active cardiopulmonary disease.   Electronically Signed   By: Anner Crete M.D.   On: 01/22/2015 22:18   Ct Head Wo Contrast  01/22/2015   CLINICAL DATA:  Right hand numbness and weakness.  EXAM: CT HEAD WITHOUT CONTRAST  TECHNIQUE: Contiguous axial images were obtained from the base of the skull through the vertex without intravenous contrast.  COMPARISON:  CT brain 02/19/2010  FINDINGS: There is no evidence of mass effect, midline shift, or extra-axial fluid collections. There is no evidence of a space-occupying lesion or intracranial hemorrhage. There is no evidence of a cortical-based area of acute infarction. There is generalized cerebral atrophy. There is periventricular white matter low attenuation likely secondary to microangiopathy.  The ventricles and sulci  are appropriate for the patient's age. The basal cisterns are patent.  Visualized portions of the orbits are unremarkable. The mastoid sinuses are clear. There is bilateral ethmoid sinus mucosal thickening. Cerebrovascular atherosclerotic calcifications are noted.  The osseous structures are unremarkable.  IMPRESSION: 1. No acute intracranial pathology. 2. Chronic microvascular disease and cerebral atrophy.   Electronically Signed   By: Kathreen Devoid   On: 01/22/2015 10:03   Ct Angio Neck W/cm &/or Wo/cm  01/22/2015   CLINICAL DATA:  Two day history of  right-sided weakness. Initial encounter.  EXAM: CT ANGIOGRAPHY HEAD AND NECK  TECHNIQUE: Multidetector CT imaging of the head and neck was performed using the standard protocol during bolus administration of intravenous contrast. Multiplanar CT image reconstructions and MIPs were obtained to evaluate the vascular anatomy. Carotid stenosis measurements (when applicable) are obtained utilizing NASCET criteria, using the distal internal carotid diameter as the denominator.  CONTRAST:  3mL OMNIPAQUE IOHEXOL 350 MG/ML SOLN  COMPARISON:  MR head and MRA head earlier today. Extracranial MRA 01/26/2014.  FINDINGS: CT HEAD  Calvarium and skull base: No fracture or destructive lesion. Mastoids and middle ears are grossly clear.  Paranasal sinuses: Imaged portions are clear.  Orbits: Negative.  Brain: No evidence of acute abnormality, including acute infarct, hemorrhage, hydrocephalus, or mass lesion. The prior acute infarcts noted on earlier today MRI are not well seen. There are chronic changes of an old LEFT parietal infarct redemonstrated.  CTA NECK  Aortic arch: Standard branching. Imaged portion shows no evidence of aneurysm or dissection. No significant stenosis of the major arch vessel origins.  Right carotid system: Mild non stenotic calcific plaque at the bifurcation. No evidence of dissection, stenosis (50% or greater) or occlusion.  Left carotid system: Calcified, and noncalcified plaque, with slightly greater than 50% stenosis in the proximal LEFT internal carotid artery based on luminal measurements of 5.3 proximal/ distal. No evidence of dissection, or occlusion.  Vertebral arteries: RIGHT vertebral is chronically occluded, with only faint evidence of muscular collateral attempt through the neck. No continuous vessel. LEFT vertebral widely patent. No evidence of LEFT vertebral dissection, stenosis (50% or greater) or occlusion.  CTA HEAD  Anterior circulation: At the junction of the horizontal petrous and  inferior cavernous segments on the LEFT, there is a high-grade stenosis, estimated 90%, similar to the appearance from the previous MRA scans. No intraluminal thrombus is evident. No similar lesion on the RIGHT. No significant stenosis, proximal occlusion, aneurysm, or vascular malformation.  Posterior circulation: LEFT vertebral is the dominant/ sole contributor to the basilar. Mild non stenotic irregularity of a calcific nature in its V4 segment. No significant stenosis, proximal occlusion, aneurysm, or vascular malformation.  Venous sinuses: As permitted by contrast timing, patent.  Anatomic variants: None of significance.  Delayed phase:   No abnormal intracranial enhancement.  IMPRESSION: High-grade stenosis of the skullbase LEFT ICA at the junction of the horizontal petrous and inferior cavernous segments estimated 90%. No intraluminal filling defect to suggest thrombus. The MR finding of a possible filling defect in the LEFT ICA relates to turbulent flow.  Chronically occluded RIGHT vertebral artery.  Greater than 50% stenosis proximal LEFT ICA, not clearly flow reducing. This tandem lesion does further compromise flow to the LEFT anterior circulation.   Electronically Signed   By: Staci Righter M.D.   On: 01/22/2015 17:10   Mr Jodene Nam Head Wo Contrast  01/22/2015   CLINICAL DATA:  Two day history of right-sided weakness. Initial evaluation.  EXAM: MRI  HEAD WITHOUT CONTRAST  MRA HEAD WITHOUT CONTRAST  TECHNIQUE: Multiplanar, multiecho pulse sequences of the brain and surrounding structures were obtained without intravenous contrast. Angiographic images of the head were obtained using MRA technique without contrast.  COMPARISON:  Head CT earlier same day.  MRI 02/27/2012.  FINDINGS: MRI HEAD FINDINGS  Diffusion imaging shows scattered punctate foci of acute infarction within the cortical brain throughout the left hemisphere consistent with micro embolic infarctions. No acute infarctions seen on the right or in  the cerebellum.  There are mild chronic small-vessel ischemic changes of the brainstem. The cerebellum is normal. The cerebral hemispheres show generalized atrophy with old small vessel ischemic changes within the white matter. 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 No evidence of mass lesion, hemorrhage, hydrocephalus or extra-axial collection. No pituitary mass. There is mild mucosal inflammation of the paranasal sinuses. No skull or skullbase lesion.  MRA HEAD FINDINGS  The right internal carotid artery is widely patent through the siphon region. The anterior and middle cerebral vessels are patent without correctable proximal stenosis, aneurysm or vascular malformation. More distal branch vessels do show some atherosclerotic narrowing and irregularity.  The left internal carotid artery shows a severe stenosis in the petrous region, 90% or greater. There is the appearance of an intraluminal filling defect beyond that. In the siphon region, the vessel is widely patent. The anterior and middle cerebral vessels are patent, but there are areas of severe narrowing that could be due to atherosclerotic disease or possibly nonocclusive embolic material.  The right vertebral artery is chronically occluded. Left vertebral artery is widely patent to the basilar. No basilar stenosis. Posterior circulation branch vessels are patent. Right PCA receives it supply from the anterior circulation. More distal branch vessels show atherosclerotic irregularity.  IMPRESSION: Punctate foci of acute infarctions scattered throughout the anterior circulation distribution on the left consistent with micro embolic infarctions. Some of these could be watershed.  Chronic small vessel ischemic changes elsewhere throughout the brain. Old left parietal cortical infarction.  Severe focal stenosis of the left petrous carotid, 90% or greater. Possible filling defect distal to that which could be intraluminal thrombus or  signal loss from severe turbulence. Stenoses in the anterior circulation intracranial branches, left worse than right. On the left, these could be atherosclerotic stenoses or conceivably due to nonocclusive embolic disease.  Chronically occluded right vertebral.  Critical Value/emergent results were called by telephone at the time of interpretation on 01/22/2015 at 2:42 pm to Dr. Joseph Berkshire ; Jim Like , who verbally acknowledged these results.   Electronically Signed   By: Nelson Chimes M.D.   On: 01/22/2015 14:44   Mr Brain Wo Contrast  01/22/2015   CLINICAL DATA:  Two day history of right-sided weakness. Initial evaluation.  EXAM: MRI HEAD WITHOUT CONTRAST  MRA HEAD WITHOUT CONTRAST  TECHNIQUE: Multiplanar, multiecho pulse sequences of the brain and surrounding structures were obtained without intravenous contrast. Angiographic images of the head were obtained using MRA technique without contrast.  COMPARISON:  Head CT earlier same day.  MRI 02/27/2012.  FINDINGS: MRI HEAD FINDINGS  Diffusion imaging shows scattered punctate foci of acute infarction within the cortical brain throughout the left hemisphere consistent with micro embolic infarctions. No acute infarctions seen on the right or in the cerebellum.  There are mild chronic small-vessel ischemic changes of the brainstem. The cerebellum is normal. The cerebral hemispheres show generalized atrophy with old small vessel ischemic changes within the white matter. There is  an old left parietal cortical and subcortical infarction. No evidence of mass lesion, hemorrhage, hydrocephalus or extra-axial collection. No pituitary mass. There is mild mucosal inflammation of the paranasal sinuses. No skull or skullbase lesion.  MRA HEAD FINDINGS  The right internal carotid artery is widely patent through the siphon region. The anterior and middle cerebral vessels are patent without correctable proximal stenosis, aneurysm or vascular malformation. More distal  branch vessels do show some atherosclerotic narrowing and irregularity.  The left internal carotid artery shows a severe stenosis in the petrous region, 90% or greater. There is the appearance of an intraluminal filling defect beyond that. In the siphon region, the vessel is widely patent. The anterior and middle cerebral vessels are patent, but there are areas of severe narrowing that could be due to atherosclerotic disease or possibly nonocclusive embolic material.  The right vertebral artery is chronically occluded. Left vertebral artery is widely patent to the basilar. No basilar stenosis. Posterior circulation branch vessels are patent. Right PCA receives it supply from the anterior circulation. More distal branch vessels show atherosclerotic irregularity.  IMPRESSION: Punctate foci of acute infarctions scattered throughout the anterior circulation distribution on the left consistent with micro embolic infarctions. Some of these could be watershed.  Chronic small vessel ischemic changes elsewhere throughout the brain. Old left parietal cortical infarction.  Severe focal stenosis of the left petrous carotid, 90% or greater. Possible filling defect distal to that which could be intraluminal thrombus or signal loss from severe turbulence. Stenoses in the anterior circulation intracranial branches, left worse than right. On the left, these could be atherosclerotic stenoses or conceivably due to nonocclusive embolic disease.  Chronically occluded right vertebral.  Critical Value/emergent results were called by telephone at the time of interpretation on 01/22/2015 at 2:42 pm to Dr. Joseph Berkshire ; Jim Like , who verbally acknowledged these results.   Electronically Signed   By: Nelson Chimes M.D.   On: 01/22/2015 14:44    Labs:  CBC:  Recent Labs  06/19/14 0416 01/22/15 0853 01/22/15 0905 01/23/15 0033 01/24/15 0254  WBC 8.2 6.7  --  7.0 6.4  HGB 13.0 12.2* 13.3 12.9* 12.2*  HCT 39.4 37.0* 39.0  39.1 36.6*  PLT 334 338  --  354 333    COAGS:  Recent Labs  06/18/14 0359 01/22/15 0853 01/24/15 0254  INR 1.07 1.07 1.08  APTT  --  29 28    BMP:  Recent Labs  06/19/14 0416 06/27/14 1316 01/22/15 0853 01/22/15 0905 01/23/15 0033 01/24/15 0254  NA 139 141 138 139 138 137  K 4.2 5.0 3.9 3.8 3.8 3.8  CL 106 105 106 104 104 105  CO2 25 22 24   --  26 26  GLUCOSE 162* 155* 199* 202* 117* 127*  BUN 19 28* 19 22* 17 19  CALCIUM 9.3 9.2 9.2  --  9.2 8.7*  CREATININE 1.19 1.28 0.90 0.80 1.04 0.98  GFRNONAA 55*  --  >60  --  >60 >60  GFRAA 64*  --  >60  --  >60 >60    LIVER FUNCTION TESTS:  Recent Labs  06/27/14 1316 01/22/15 0853  BILITOT 0.6 0.7  AST 18 19  ALT 17 15*  ALKPHOS 63 52  PROT 6.3 6.1*  ALBUMIN 4.3 3.9    Assessment and Plan:  L ICA stenosis pta 9/8 in IR Doing well Will follow  Signed: Brooklin Rieger A 01/24/2015, 4:45 PM   I spent a total of 15 Minutes at the  the patient's bedside AND on the patient's hospital floor or unit, greater than 50% of which was counseling/coordinating care for L ICA pta

## 2015-01-24 NOTE — Progress Notes (Signed)
STROKE TEAM PROGRESS NOTE   SUBJECTIVE (INTERVAL HISTORY) No family is at the bedside.  Pt back from intervention for angioplasty of left ICA. Doing well. No complaint. Right hand weakness getting improved.   OBJECTIVE Temp:  [96.8 F (36 C)-98.6 F (37 C)] 96.8 F (36 C) (09/08 1619) Pulse Rate:  [44-65] 51 (09/08 1730) Cardiac Rhythm:  [-] Sinus bradycardia (09/08 1700) Resp:  [0-23] 13 (09/08 1730) BP: (119-176)/(54-99) 131/60 mmHg (09/08 1730) SpO2:  [91 %-100 %] 94 % (09/08 1730) Arterial Line BP: (134-169)/(40-73) 169/53 mmHg (09/08 1730)  CBC:  Recent Labs Lab 01/22/15 0853  01/23/15 0033 01/24/15 0254  WBC 6.7  --  7.0 6.4  NEUTROABS 4.2  --   --   --   HGB 12.2*  < > 12.9* 12.2*  HCT 37.0*  < > 39.1 36.6*  MCV 88.9  --  87.9 87.4  PLT 338  --  354 333  < > = values in this interval not displayed.  Basic Metabolic Panel:  Recent Labs Lab 01/22/15 0854  01/23/15 0033 01/24/15 0254  NA  --   < > 138 137  K  --   < > 3.8 3.8  CL  --   < > 104 105  CO2  --   --  26 26  GLUCOSE  --   < > 117* 127*  BUN  --   < > 17 19  CREATININE  --   < > 1.04 0.98  CALCIUM  --   --  9.2 8.7*  MG 2.0  --   --   --   < > = values in this interval not displayed.  Lipid Panel:     Component Value Date/Time   CHOL 110 01/22/2015 1457   TRIG 24 01/22/2015 1457   HDL 44 01/22/2015 1457   CHOLHDL 2.5 01/22/2015 1457   VLDL 5 01/22/2015 1457   LDLCALC 61 01/22/2015 1457   HgbA1c:  Lab Results  Component Value Date   HGBA1C 7.8* 01/22/2015   Urine Drug Screen:     Component Value Date/Time   LABOPIA NONE DETECTED 01/22/2015 1031   COCAINSCRNUR NONE DETECTED 01/22/2015 1031   LABBENZ NONE DETECTED 01/22/2015 1031   AMPHETMU NONE DETECTED 01/22/2015 1031   THCU NONE DETECTED 01/22/2015 1031   LABBARB NONE DETECTED 01/22/2015 1031      IMAGING  I have personally reviewed the radiological images below and agree with the radiology interpretations.  Dg Chest 2  View 01/22/2015    No active cardiopulmonary disease.     Ct Head Wo Contrast 01/22/2015    1. No acute intracranial pathology. 2. Chronic microvascular disease and cerebral atrophy.     Ct Angio Head & Neck W/cm &/or Wo/cm 01/22/2015    High-grade stenosis of the skullbase LEFT ICA at the junction of the horizontal petrous and inferior cavernous segments estimated 90%. No intraluminal filling defect to suggest thrombus. The MR finding of a possible filling defect in the LEFT ICA relates to turbulent flow.  Chronically occluded RIGHT vertebral artery.  Greater than 50% stenosis proximal LEFT ICA, not clearly flow reducing. This tandem lesion does further compromise flow to the LEFT anterior circulation.     Mri & Mra Head Wo Contrast 01/22/2015    Punctate foci of acute infarctions scattered throughout the anterior circulation distribution on the left consistent with micro embolic infarctions. Some of these could be watershed.  Chronic small vessel ischemic changes elsewhere throughout the brain.  Old left parietal cortical infarction.  Severe focal stenosis of the left petrous carotid, 90% or greater. Possible filling defect distal to that which could be intraluminal thrombus or signal loss from severe turbulence. Stenoses in the anterior circulation intracranial branches, left worse than right. On the left, these could be atherosclerotic stenoses or conceivably due to nonocclusive embolic disease.  Chronically occluded right vertebral.   2D echo - - Left ventricle: The cavity size was normal. There was moderate concentric hypertrophy. Systolic function was normal. The estimated ejection fraction was in the range of 60% to 65%. Wall motion was normal; there were no regional wall motion abnormalities. Doppler parameters are consistent with abnormal left ventricular relaxation (grade 1 diastolic dysfunction). - Aortic valve: Trileaflet; normal thickness leaflets. - Aortic root: The aortic root was  normal in size. - Left atrium: The atrium was mildly dilated. - Right ventricle: Systolic function was normal. - Right atrium: The atrium was normal in size. - Tricuspid valve: There was mild regurgitation. - Pulmonic valve: There was no regurgitation. - Pulmonary arteries: Systolic pressure was within the normal range. - Inferior vena cava: The vessel was normal in size. - Pericardium, extracardiac: There was no pericardial effusion.  Cerebral angio -  S/P LT ICA intracranial angioplasty for severe symptomatic stenosis.with <20 % stenosis post angioplasty .   PHYSICAL EXAM  Temp:  [96.8 F (36 C)-98.6 F (37 C)] 96.8 F (36 C) (09/08 1619) Pulse Rate:  [44-65] 51 (09/08 1730) Resp:  [0-23] 13 (09/08 1730) BP: (119-176)/(54-99) 131/60 mmHg (09/08 1730) SpO2:  [91 %-100 %] 94 % (09/08 1730) Arterial Line BP: (134-169)/(40-73) 169/53 mmHg (09/08 1730)  General - Well nourished, well developed, in no apparent distress.  Ophthalmologic - Fundi not visualized due to small pupils.  Cardiovascular - Regular rate and rhythm with no murmur.  Mental Status -  Level of arousal and orientation to time, place, and person were intact. Language including expression, naming, repetition, comprehension was assessed and found intact. Fund of Knowledge was assessed and was intact.  Cranial Nerves II - XII - II - Visual field intact OU. III, IV, VI - Extraocular movements intact. V - Facial sensation intact bilaterally. VII - right nasolabial fold flattening. VIII - Hearing & vestibular intact bilaterally. X - Palate elevates symmetrically. XI - Chin turning & shoulder shrug intact bilaterally. XII - Tongue protrusion intact.  Motor Strength - The patient's strength was normal in all extremities except 2/5 right hand grip, 3-/5 wrist extension and 3/5 flexion and pronator drift was absent.  Bulk was normal and fasciculations were absent.   Motor Tone - Muscle tone was assessed at the  neck and appendages and was normal.  Reflexes - The patient's reflexes were 1+ in all extremities and he had no pathological reflexes.  Sensory - Light touch, temperature/pinprick were assessed and were symmetrical except decreased sensation distal to right wrist.    Coordination - The patient had normal movements in the hands and feet with no ataxia or dysmetria.  Tremor was absent.  Gait and Station - not tested due safety concerns.   ASSESSMENT/PLAN [name] is a 81 y.o. male with history of CAD, HTN, DM, HLD presenting with 2 day history of right sided weakness. He did not receive IV t-PA due to delay in arrival . MRI showed left MCA/PCA and MCA/ACA watershed infarcts, consistent with left petrous portion ICA high grade stenosis. Pt had no hypotension episodes. CTA head and neck ruled out thrombosis  distal to stenosis segment.   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 Pt had left MCA stroke in 2013 and had dizziness episode in 2015. Pt has had no hypotension episodes. Combination of the fact of recurrent left MCA stroke and chronic HTN running in 180s, agree with cerebral angio with potential intervention.   Resultant  Right hand weakness and numbenss, improving  MRI  left punctate scattered infarctions in a watershed distribution. Old L parietal cortical infarct.  MRA  Focal L petrous stenosis 90%. Chronic occluded L VA  CTA head and neck - no thrombosis distal to left petrous ICA stenosis  2D Echo  pending   S/p left ICA angioplasty without stenting needed.  LDL 61  HgbA1c 7.8, not at goal  IV heparin for VTE prophylaxis Diet clear liquid Room service appropriate?: Yes; Fluid consistency:: Thin  clopidogrel 75 mg orally every day prior to admission, now on heparin. Heparin overnight, Will start ASA and plavix tomorrow morning.   Ongoing aggressive stroke  risk factor management  Therapy recommendations:  pending   Disposition:  pending   Left petrous ICA high grade stenosis  Likely the culprit for at least two left hemisphere strokes  Still has watershed infarct this time in the setting of chronic hypertension 180s  No thrombosis on CTA head distal to stenosis  S/p left ICA angioplasty without stenting needed.  Heparin overnight, Will start ASA and plavix tomorrow morning.   Essential Hypertension  No signs of hypotension on/before arrival  Chronic hypertension 160 -180s at home BP goal < 160 post intervention No BP meds for now.   Hyperlipidemia  Home meds:  crestor 5 mg daily, resumed in hospital  LDL 61, goal < 70  Continue statin at discharge  Diabetes type II  HgbA1c 7.8, goal < 7.0  Uncontrolled  SSI  CBG monitoring  Other Stroke Risk Factors  Advanced age  Former Cigarette smoker, quit smoking 57 years ago   Hx stroke/TIA - 2013 w/ right hand weakness  Coronary artery disease - non-STEMI, CABG x 2  Post op atrial fibrillation   Obstructive sleep apnea, on CPAP at home  AAA  Other Active Problems  On heparin drip for now  Hospital day # 2  This patient is critically ill due to critical high grade ICA stenosis with watershed infarcts, s/p angioplasty and at significant risk of neurological worsening, death form recurrent infarcts, ICA re-occlusion, hemorrhagic infarct due to heparin drip and complications from intervention. This patient's care requires constant monitoring of vital signs, hemodynamics, respiratory and cardiac monitoring, review of multiple databases, neurological assessment, discussion with family, other specialists and medical decision making of high complexity. I spent 35 minutes of neurocritical care time in the care of this patient.   Rosalin Hawking, MD PhD Stroke Neurology 01/24/2015 5:53 PM      To contact Stroke Continuity provider, please refer to [email or web address] After  hours, contact General Neurology

## 2015-01-24 NOTE — Progress Notes (Signed)
Patient ID: [name], male   DOB: 06-17-1930, 81 y.o.   MRN: [id number] Post procedure. Awake,alert Ox3  DEnies N/V,H/As,visual or motor or sensory symptoms. Neurologically no change in RT hand weakness.Otherwise stable. RT groin soft No palpable hematoma.. Plan. Will advance diet to clear as tolerated. Close BP control ,neuro monitoring  And IV heparin . VS BP 130s/70s HR 50s to 60s. PaO2 >95 % D/W Patient and family.

## 2015-01-24 NOTE — Procedures (Signed)
S/P LT ICA intracranial angioplasty for severe symptomatic stenosis.with <  20  % patency post angioplasty .

## 2015-01-24 NOTE — Progress Notes (Signed)
OT Evaluation  Pt with deficits listed below. Pt will need to follow up with OT at the neuro outpt center. Pt will benefit from a R wrist cock up splint. Will follow acutely. Will further assess vision next visit.    01/24/15 0900  OT Visit Information  Last OT Received On 01/24/15  Assistance Needed +1  History of Present Illness [name] is an 81 y.o. male hx of CAD, HTN, DM, HLD presenting with 2 day history of right sided weakness. Patient notes waking up and feeling like his RUE was asleep with a pins and needles sensation. Notes difficulty moving his hand and grasping items. Symptoms have not improved so he decided to come to the ED  Precautions  Precautions None  Home Living  Family/patient expects to be discharged to: Private residence  Living Arrangements Spouse/significant other;Other (Comment)  Available Help at Discharge Other (Comment)  Type of [place] to enter  Entrance Stairs-Number of Steps several  Entrance Stairs-Rails Right;Left  Home Layout Two level;Other (Comment)  Alternate Level Stairs-Number of Steps flight  Alternate Level Stairs-Rails Right;Left  Bathroom Shower/Tub Walk-in shower  Bathroom Toilet Handicapped height  Bathroom Accessibility Yes  How Accessible Accessible via walker  [place] - 2 wheels;Cane - single point;BSC;Shower seat;Wheelchair - Education officer, community - power  Lives With Spouse  Prior Function  Level of Independence Independent  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  Pt with minimal movement R wrist and hand. Moves synergistically distally. Unable to make full fist or extend wrist to neutral. Only able to demonstrate @ 15 degrees composite flexion of digitis. minimal thumg movement noted. Not using R hand functionally.[place] Blase II hand.  LUE Sensation decreased light touch (forearm to hand)  LUE Coordination decreased fine motor;decreased gross motor  Lower Extremity Assessment  Lower Extremity Assessment Overall WFL for tasks assessed  Cervical / Trunk Assessment  Cervical / Trunk Assessment Normal  ADL  Overall ADL's  Needs assistance/impaired  General ADL Comments Eval completed at bed level per Dr. Erlinda Hong request. Pt states that he is able to complete his ADL, although it takes him longer and that opeingin packages, etc is very difficult  Vision- History  Patient Visual Report No change from baseline  Vision- Assessment  Additional Comments will further assess  Praxis  Praxis tested? WFL  Bed Mobility  General bed mobility comments independent per PT  Transfers  General transfer comment independent per PT  OT - End of Session  Activity Tolerance Patient tolerated treatment well  Patient left in bed;with call bell/phone within reach  Nurse Communication Mobility status;Other (comment) (D/C recommendations)  OT Assessment  OT Therapy Diagnosis  Generalized weakness  OT Recommendation/Assessment Patient needs continued OT Services  OT Problem List Decreased strength;Decreased range of motion;Decreased coordination;Impaired sensation;Impaired tone;Impaired UE functional use  OT Plan  OT Frequency (ACUTE ONLY) Min 2X/week  OT Treatment/Interventions (ACUTE ONLY) Self-care/ADL training;Therapeutic exercise;Neuromuscular education;Patient/family education  OT Recommendation  Follow Up Recommendations Outpatient OT  OT Equipment None recommended by OT  Individuals Consulted  Consulted and Agree with Results and Recommendations Patient  Acute Rehab  OT Goals  Patient Stated Goal to use my hand  OT Goal Formulation With patient  Time For Goal Achievement 02/07/15  Potential to Achieve Goals Good  OT Time Calculation  OT Start Time (ACUTE ONLY) 0930  OT Stop Time (ACUTE ONLY) 0945  OT Time Calculation (min) 15 min  OT General Charges  $OT Visit 1 Procedure  OT Evaluation  $Initial OT Evaluation Tier I 1 Procedure  Written Expression  Dominant Hand Left  Maurie Boettcher, OTR/L  [phone] 01/24/2015

## 2015-01-24 NOTE — Transfer of Care (Signed)
Immediate Anesthesia Transfer of Care Note  Patient: [name]  Procedure(s) Performed: Procedure(s): STENT ASSISTED ANGIOPLASTY (RADIOLOGY WITH ANESTHESIA) (N/A)  Patient Location: PACU  Anesthesia Type:General  Level of Consciousness: awake, alert  and oriented  Airway & Oxygen Therapy: Patient Spontanous Breathing and Patient connected to face mask oxygen  Post-op Assessment: Report given to RN, Post -op Vital signs reviewed and stable, Patient moving all extremities and Patient able to stick tongue midline  Post vital signs: Reviewed and stable  Last Vitals:  Filed Vitals:   01/24/15 1558  BP:   Pulse:   Temp: 36.3 C  Resp: 10    Complications: No apparent anesthesia complications

## 2015-01-24 NOTE — Progress Notes (Signed)
Pt back to room, got report from [facility] RN PACU. Pt sleepy but stable, rt hand remains flaccid and numb. Rt groin level 0 VSS. Will monitor closely.

## 2015-01-24 NOTE — Progress Notes (Signed)
ANTICOAGULATION CONSULT NOTE  Pharmacy Consult for Heparin Indication: post intracranial angioplasty  Allergies  Allergen Reactions  . Atorvastatin     Myalgias  . Crestor [Rosuvastatin]   . Other Other (See Comments)    No  BLOOD PRODUCTS - Pt is [place] [place]  . Pravastatin     Myalgias  . Simvastatin     Myalgias     Patient Measurements: Height: 6\' 1"  (185.4 cm) Weight: 222 lb 0.1 oz (100.7 kg) IBW/kg (Calculated) : 79.9  Vital Signs: Temp: 96.8 F (36 C) (09/08 1619) Temp Source: Oral (09/08 0710) BP: 119/54 mmHg (09/08 1619) Pulse Rate: 53 (09/08 1611)  Labs:  Recent Labs  01/22/15 0853 01/22/15 0905 01/23/15 0033 01/23/15 0815 01/24/15 0254  HGB 12.2* 13.3 12.9*  --  12.2*  HCT 37.0* 39.0 39.1  --  36.6*  PLT 338  --  354  --  333  APTT 29  --   --   --  28  LABPROT 14.1  --   --   --  14.2  INR 1.07  --   --   --  1.08  HEPARINUNFRC  --   --  0.36 0.49  --   CREATININE 0.90 0.80 1.04  --  0.98    Estimated Creatinine Clearance: 73.8 mL/min (by C-G formula based on Cr of 0.98).   Assessment: 81 y.o. male with carotid stenosis, possible thrombus, awaiting cerebral arteriogram continues on heparin. Level remains therapeutic at 0.49 this AM. No bleeding noted. Arterigram planned for today.   Pt underwent L ICA angioplasty with Dr. Estanislado Pandy today and was started on heparin 500 units/hr. Hgb 12.2, Plt 333, sCr 0.98.   Goal of Therapy:  Heparin level 0.1-0.25  Monitor platelets by anticoagulation protocol: Yes   Plan:  Increase heparin to 900 units/hr 8h HL Due to stop tomorrow at [facility]. Diona Foley, PharmD Clinical Pharmacist Pager [phone] 01/24/2015 5:06 PM

## 2015-01-24 NOTE — Progress Notes (Signed)
Pt taken to pre op area accompanied by daughter [name]. Report given to Lindsi RN.

## 2015-01-24 NOTE — Anesthesia Procedure Notes (Signed)
Procedure Name: Intubation Date/Time: 01/24/2015 12:41 PM Performed by: Ollen Bowl Pre-anesthesia Checklist: Patient identified, Emergency Drugs available, Suction available, Patient being monitored and Timeout performed Patient Re-evaluated:Patient Re-evaluated prior to inductionOxygen Delivery Method: Circle system utilized Preoxygenation: Pre-oxygenation with 100% oxygen Intubation Type: IV induction Ventilation: Mask ventilation with difficulty Laryngoscope Size: Mac and 4 Grade View: Grade II Tube type: Oral Tube size: 8.0 mm Number of attempts: 1 Airway Equipment and Method: Stylet Placement Confirmation: ETT inserted through vocal cords under direct vision,  positive ETCO2 and breath sounds checked- equal and bilateral Secured at: 22 cm Tube secured with: Tape Dental Injury: Teeth and Oropharynx as per pre-operative assessment  Comments: Performed by Collins Scotland, MD

## 2015-01-24 NOTE — Progress Notes (Signed)
OR staffVisual merchandiser) came to get pt. RN updated since last report given this morning. Family left unit with pt and OR staff to go to the appropriate waiting area.

## 2015-01-24 NOTE — Anesthesia Postprocedure Evaluation (Signed)
Anesthesia Post Note  Patient: [name]  Procedure(s) Performed: Procedure(s) (LRB): STENT ASSISTED ANGIOPLASTY (RADIOLOGY WITH ANESTHESIA) (N/A)  Anesthesia type: general  Patient location: PACU  Post pain: Pain level controlled  Post assessment: Patient's Cardiovascular Status Stable  Last Vitals:  Filed Vitals:   01/24/15 1745  BP: 140/61  Pulse: 55  Temp:   Resp: 0    Post vital signs: Reviewed and stable  Level of consciousness: sedated  Complications: No apparent anesthesia complications

## 2015-01-24 NOTE — Anesthesia Preprocedure Evaluation (Addendum)
Anesthesia Evaluation  Patient identified by MRN, date of birth, ID band Patient awake    Reviewed: Allergy & Precautions, NPO status , Patient's Chart, lab work & pertinent test results, reviewed documented beta blocker date and time   History of Anesthesia Complications Negative for: history of anesthetic complications  Airway Mallampati: III  TM Distance: >3 FB Neck ROM: Full    Dental  (+) Dental Advisory Given, Missing, Chipped,    Pulmonary sleep apnea , former smoker,    Pulmonary exam normal breath sounds clear to auscultation       Cardiovascular hypertension, Pt. on medications and Pt. on home beta blockers (-) angina+ CAD, + Past MI, + Cardiac Stents, + CABG and + Peripheral Vascular Disease  (-) dysrhythmias  Rhythm:Regular Rate:Bradycardia     Neuro/Psych CVA (right arm weakness), Residual Symptoms negative psych ROS   GI/Hepatic negative GI ROS, Neg liver ROS,   Endo/Other  diabetes, Type 2, Oral Hypoglycemic Agents  Renal/GU negative Renal ROS     Musculoskeletal negative musculoskeletal ROS (+)   Abdominal   Peds  Hematology  (+) Blood dyscrasia, anemia ,   Anesthesia Other Findings Day of surgery medications reviewed with the patient.  Reproductive/Obstetrics                          Anesthesia Physical Anesthesia Plan  ASA: III  Anesthesia Plan: General   Post-op Pain Management:    Induction: Intravenous  Airway Management Planned: Oral ETT and Video Laryngoscope Planned  Additional Equipment: Arterial line  Intra-op Plan:   Post-operative Plan: Extubation in OR and Possible Post-op intubation/ventilation  Informed Consent: I have reviewed the patients History and Physical, chart, labs and discussed the procedure including the risks, benefits and alternatives for the proposed anesthesia with the patient or authorized representative who has indicated his/her  understanding and acceptance.   Dental advisory given  Plan Discussed with: CRNA  Anesthesia Plan Comments: (Risks/benefits of general anesthesia discussed with patient including risk of damage to teeth, lips, gum, and tongue, nausea/vomiting, allergic reactions to medications, and the possibility of heart attack, stroke and death.  All patient questions answered.  Patient wishes to proceed.)        Anesthesia Quick Evaluation

## 2015-01-25 ENCOUNTER — Encounter (HOSPITAL_COMMUNITY): Payer: Self-pay | Admitting: Interventional Radiology

## 2015-01-25 DIAGNOSIS — E785 Hyperlipidemia, unspecified: Secondary | ICD-10-CM | POA: Insufficient documentation

## 2015-01-25 DIAGNOSIS — I6522 Occlusion and stenosis of left carotid artery: Secondary | ICD-10-CM | POA: Insufficient documentation

## 2015-01-25 DIAGNOSIS — I639 Cerebral infarction, unspecified: Secondary | ICD-10-CM

## 2015-01-25 DIAGNOSIS — I10 Essential (primary) hypertension: Secondary | ICD-10-CM | POA: Insufficient documentation

## 2015-01-25 LAB — BASIC METABOLIC PANEL
Anion gap: 7 (ref 5–15)
BUN: 16 mg/dL (ref 6–20)
CALCIUM: 8.1 mg/dL — AB (ref 8.9–10.3)
CO2: 24 mmol/L (ref 22–32)
CREATININE: 1.03 mg/dL (ref 0.61–1.24)
Chloride: 106 mmol/L (ref 101–111)
GFR calc Af Amer: >60 mL/min (ref >60)
GLUCOSE: 167 mg/dL — AB (ref 65–99)
POTASSIUM: 3.6 mmol/L (ref 3.5–5.1)
SODIUM: 137 mmol/L (ref 135–145)

## 2015-01-25 LAB — CBC WITH DIFFERENTIAL/PLATELET
BASOS ABS: 0 10*3/uL (ref 0.0–0.1)
Basophils Relative: 0 % (ref 0–1)
EOS ABS: 0.2 10*3/uL (ref 0.0–0.7)
EOS PCT: 2 % (ref 0–5)
HCT: 34.4 % — ABNORMAL LOW (ref 39.0–52.0)
Hemoglobin: 11.4 g/dL — ABNORMAL LOW (ref 13.0–17.0)
Lymphocytes Relative: 17 % (ref 12–46)
Lymphs Abs: 1.8 10*3/uL (ref 0.7–4.0)
MCH: 29.1 pg (ref 26.0–34.0)
MCHC: 33.1 g/dL (ref 30.0–36.0)
MCV: 87.8 fL (ref 78.0–100.0)
MONO ABS: 1.1 10*3/uL — AB (ref 0.1–1.0)
Monocytes Relative: 11 % (ref 3–12)
Neutro Abs: 7.5 10*3/uL (ref 1.7–7.7)
Neutrophils Relative %: 70 % (ref 43–77)
PLATELETS: 333 10*3/uL (ref 150–400)
RBC: 3.92 MIL/uL — AB (ref 4.22–5.81)
RDW: 14.9 % (ref 11.5–15.5)
WBC: 10.7 10*3/uL — AB (ref 4.0–10.5)

## 2015-01-25 LAB — GLUCOSE, CAPILLARY
GLUCOSE-CAPILLARY: 149 mg/dL — AB (ref 65–99)
GLUCOSE-CAPILLARY: 189 mg/dL — AB (ref 65–99)
Glucose-Capillary: 237 mg/dL — ABNORMAL HIGH (ref 65–99)
Glucose-Capillary: 199 mg/dL — ABNORMAL HIGH (ref 65–99)

## 2015-01-25 LAB — HEPARIN LEVEL (UNFRACTIONATED): HEPARIN UNFRACTIONATED: 0.13 [IU]/mL — AB (ref 0.30–0.70)

## 2015-01-25 MED ORDER — LABETALOL HCL 5 MG/ML IV SOLN: 10.0000 mg | INTRAVENOUS | Status: DC | PRN

## 2015-01-25 NOTE — Progress Notes (Signed)
Referring Physician(s): Xu  Chief Complaint:  L ICA stenosis  Subjective:  L ICA angioplasty in IR 9/8 Doing well Rt hand doing better!! No complaints  Allergies: Atorvastatin; Crestor; Other; Pravastatin; and Simvastatin  Medications: Prior to Admission medications   Medication Sig Start Date End Date Taking? Authorizing Provider  amLODipine (NORVASC) 10 MG tablet Take 10 mg by mouth every evening.    Yes Historical Provider, MD  Bioflavonoid Products (SUPER C-500) TABS Take 1 tablet by mouth every evening.    Yes Historical Provider, MD  bisacodyl (DULCOLAX) 5 MG EC tablet Take 1 tablet (5 mg total) by mouth daily as needed for moderate constipation. 06/27/14  Yes Dayna N Dunn, PA-C  clopidogrel (PLAVIX) 75 MG tablet Take 1 tablet (75 mg total) by mouth daily. 12/19/14  Yes Leonie Man, MD  docusate sodium (COLACE) 100 MG capsule Take 1 capsule (100 mg total) by mouth 2 (two) times daily. 06/27/14  Yes Dayna N Dunn, PA-C  doxazosin (CARDURA) 4 MG tablet Take 1 tablet by mouth daily. 03/27/14  Yes Historical Provider, MD  glimepiride (AMARYL) 4 MG tablet Take 4 mg by mouth 2 (two) times daily with a meal.    Yes Historical Provider, MD  hydrALAZINE (APRESOLINE) 100 MG tablet Take 1 tablet (100 mg total) by mouth 2 (two) times daily. 12/19/14  Yes Leonie Man, MD  labetalol (NORMODYNE) 200 MG tablet Take 0.5 tablets (100 mg total) by mouth 2 (two) times daily. 02/23/14  Yes Lorretta Harp, MD  losartan-hydrochlorothiazide (HYZAAR) 100-25 MG per tablet Take 1 tablet by mouth every morning.    Yes Historical Provider, MD  metFORMIN (GLUCOPHAGE) 1000 MG tablet Take 1 tablet by mouth 2 (two) times daily. 07/25/14  Yes Historical Provider, MD  nitroGLYCERIN (NITROSTAT) 0.4 MG SL tablet Place 1 tablet (0.4 mg total) under the tongue every 5 (five) minutes as needed for chest pain (up to 3 doses). 06/19/14  Yes Dayna N Dunn, PA-C  rosuvastatin (CRESTOR) 5 MG tablet Take 1 tablet (5 mg  total) by mouth 2 (two) times a week. Patient taking differently: Take 2.5 mg by mouth 2 (two) times a week.  06/27/14  Yes Dayna N Dunn, PA-C     Vital Signs: BP 138/67 mmHg  Pulse 70  Temp(Src) 97.6 F (36.4 C) (Oral)  Resp 17  Ht 6\' 1"  (1.854 m)  Wt 222 lb 0.1 oz (100.7 kg)  BMI 29.30 kg/m2  SpO2 94%  Physical Exam  Constitutional: He is oriented to person, place, and time.  Abdominal: Soft.  Rt groin NT no bleeding No hematoma   Musculoskeletal: Normal range of motion.  Rt arm/hand weaker---but better than yesterday Rt foot 2+ pulses  Neurological: He is alert and oriented to person, place, and time.  Skin: Skin is warm.  Psychiatric: He has a normal mood and affect. His behavior is normal. Judgment and thought content normal.  Nursing note and vitals reviewed.   Imaging: Ct Angio Head W/cm &/or Wo Cm  01/22/2015   CLINICAL DATA:  Two day history of right-sided weakness. Initial encounter.  EXAM: CT ANGIOGRAPHY HEAD AND NECK  TECHNIQUE: Multidetector CT imaging of the head and neck was performed using the standard protocol during bolus administration of intravenous contrast. Multiplanar CT image reconstructions and MIPs were obtained to evaluate the vascular anatomy. Carotid stenosis measurements (when applicable) are obtained utilizing NASCET criteria, using the distal internal carotid diameter as the denominator.  CONTRAST:  10mL OMNIPAQUE IOHEXOL  350 MG/ML SOLN  COMPARISON:  MR head and MRA head earlier today. Extracranial MRA 01/26/2014.  FINDINGS: CT HEAD  Calvarium and skull base: No fracture or destructive lesion. Mastoids and middle ears are grossly clear.  Paranasal sinuses: Imaged portions are clear.  Orbits: Negative.  Brain: No evidence of acute abnormality, including acute infarct, hemorrhage, hydrocephalus, or mass lesion. The prior acute infarcts noted on earlier today MRI are not well seen. There are chronic changes of an old LEFT parietal infarct redemonstrated.   CTA NECK  Aortic arch: Standard branching. Imaged portion shows no evidence of aneurysm or dissection. No significant stenosis of the major arch vessel origins.  Right carotid system: Mild non stenotic calcific plaque at the bifurcation. No evidence of dissection, stenosis (50% or greater) or occlusion.  Left carotid system: Calcified, and noncalcified plaque, with slightly greater than 50% stenosis in the proximal LEFT internal carotid artery based on luminal measurements of 5.3 proximal/ distal. No evidence of dissection, or occlusion.  Vertebral arteries: RIGHT vertebral is chronically occluded, with only faint evidence of muscular collateral attempt through the neck. No continuous vessel. LEFT vertebral widely patent. No evidence of LEFT vertebral dissection, stenosis (50% or greater) or occlusion.  CTA HEAD  Anterior circulation: At the junction of the horizontal petrous and inferior cavernous segments on the LEFT, there is a high-grade stenosis, estimated 90%, similar to the appearance from the previous MRA scans. No intraluminal thrombus is evident. No similar lesion on the RIGHT. No significant stenosis, proximal occlusion, aneurysm, or vascular malformation.  Posterior circulation: LEFT vertebral is the dominant/ sole contributor to the basilar. Mild non stenotic irregularity of a calcific nature in its V4 segment. No significant stenosis, proximal occlusion, aneurysm, or vascular malformation.  Venous sinuses: As permitted by contrast timing, patent.  Anatomic variants: None of significance.  Delayed phase:   No abnormal intracranial enhancement.  IMPRESSION: High-grade stenosis of the skullbase LEFT ICA at the junction of the horizontal petrous and inferior cavernous segments estimated 90%. No intraluminal filling defect to suggest thrombus. The MR finding of a possible filling defect in the LEFT ICA relates to turbulent flow.  Chronically occluded RIGHT vertebral artery.  Greater than 50% stenosis  proximal LEFT ICA, not clearly flow reducing. This tandem lesion does further compromise flow to the LEFT anterior circulation.   Electronically Signed   By: Staci Righter M.D.   On: 01/22/2015 17:10   Dg Chest 2 View  01/22/2015   CLINICAL DATA:  81 year old male with possible stroke. No chest complaints.  EXAM: CHEST  2 VIEW  COMPARISON:  Chest radiograph dated 06/17/2014  FINDINGS: Bibasilar dependent subsegmental atelectatic changes noted. There is no focal consolidation, pleural effusion, or pneumothorax. Stable cardiac silhouette. Median sternotomy wires. The osseous structures are grossly unremarkable.  IMPRESSION: No active cardiopulmonary disease.   Electronically Signed   By: Anner Crete M.D.   On: 01/22/2015 22:18   Ct Head Wo Contrast  01/22/2015   CLINICAL DATA:  Right hand numbness and weakness.  EXAM: CT HEAD WITHOUT CONTRAST  TECHNIQUE: Contiguous axial images were obtained from the base of the skull through the vertex without intravenous contrast.  COMPARISON:  CT brain 02/19/2010  FINDINGS: There is no evidence of mass effect, midline shift, or extra-axial fluid collections. There is no evidence of a space-occupying lesion or intracranial hemorrhage. There is no evidence of a cortical-based area of acute infarction. There is generalized cerebral atrophy. There is periventricular white matter low attenuation likely secondary to  microangiopathy.  The ventricles and sulci are appropriate for the patient's age. The basal cisterns are patent.  Visualized portions of the orbits are unremarkable. The mastoid sinuses are clear. There is bilateral ethmoid sinus mucosal thickening. Cerebrovascular atherosclerotic calcifications are noted.  The osseous structures are unremarkable.  IMPRESSION: 1. No acute intracranial pathology. 2. Chronic microvascular disease and cerebral atrophy.   Electronically Signed   By: Kathreen Devoid   On: 01/22/2015 10:03   Ct Angio Neck W/cm &/or Wo/cm  01/22/2015    CLINICAL DATA:  Two day history of right-sided weakness. Initial encounter.  EXAM: CT ANGIOGRAPHY HEAD AND NECK  TECHNIQUE: Multidetector CT imaging of the head and neck was performed using the standard protocol during bolus administration of intravenous contrast. Multiplanar CT image reconstructions and MIPs were obtained to evaluate the vascular anatomy. Carotid stenosis measurements (when applicable) are obtained utilizing NASCET criteria, using the distal internal carotid diameter as the denominator.  CONTRAST:  43mL OMNIPAQUE IOHEXOL 350 MG/ML SOLN  COMPARISON:  MR head and MRA head earlier today. Extracranial MRA 01/26/2014.  FINDINGS: CT HEAD  Calvarium and skull base: No fracture or destructive lesion. Mastoids and middle ears are grossly clear.  Paranasal sinuses: Imaged portions are clear.  Orbits: Negative.  Brain: No evidence of acute abnormality, including acute infarct, hemorrhage, hydrocephalus, or mass lesion. The prior acute infarcts noted on earlier today MRI are not well seen. There are chronic changes of an old LEFT parietal infarct redemonstrated.  CTA NECK  Aortic arch: Standard branching. Imaged portion shows no evidence of aneurysm or dissection. No significant stenosis of the major arch vessel origins.  Right carotid system: Mild non stenotic calcific plaque at the bifurcation. No evidence of dissection, stenosis (50% or greater) or occlusion.  Left carotid system: Calcified, and noncalcified plaque, with slightly greater than 50% stenosis in the proximal LEFT internal carotid artery based on luminal measurements of 5.3 proximal/ distal. No evidence of dissection, or occlusion.  Vertebral arteries: RIGHT vertebral is chronically occluded, with only faint evidence of muscular collateral attempt through the neck. No continuous vessel. LEFT vertebral widely patent. No evidence of LEFT vertebral dissection, stenosis (50% or greater) or occlusion.  CTA HEAD  Anterior circulation: At the junction  of the horizontal petrous and inferior cavernous segments on the LEFT, there is a high-grade stenosis, estimated 90%, similar to the appearance from the previous MRA scans. No intraluminal thrombus is evident. No similar lesion on the RIGHT. No significant stenosis, proximal occlusion, aneurysm, or vascular malformation.  Posterior circulation: LEFT vertebral is the dominant/ sole contributor to the basilar. Mild non stenotic irregularity of a calcific nature in its V4 segment. No significant stenosis, proximal occlusion, aneurysm, or vascular malformation.  Venous sinuses: As permitted by contrast timing, patent.  Anatomic variants: None of significance.  Delayed phase:   No abnormal intracranial enhancement.  IMPRESSION: High-grade stenosis of the skullbase LEFT ICA at the junction of the horizontal petrous and inferior cavernous segments estimated 90%. No intraluminal filling defect to suggest thrombus. The MR finding of a possible filling defect in the LEFT ICA relates to turbulent flow.  Chronically occluded RIGHT vertebral artery.  Greater than 50% stenosis proximal LEFT ICA, not clearly flow reducing. This tandem lesion does further compromise flow to the LEFT anterior circulation.   Electronically Signed   By: Staci Righter M.D.   On: 01/22/2015 17:10   Mr Jodene Nam Head Wo Contrast  01/22/2015   CLINICAL DATA:  Two day history of right-sided  weakness. Initial evaluation.  EXAM: MRI HEAD WITHOUT CONTRAST  MRA HEAD WITHOUT CONTRAST  TECHNIQUE: Multiplanar, multiecho pulse sequences of the brain and surrounding structures were obtained without intravenous contrast. Angiographic images of the head were obtained using MRA technique without contrast.  COMPARISON:  Head CT earlier same day.  MRI 02/27/2012.  FINDINGS: MRI HEAD FINDINGS  Diffusion imaging shows scattered punctate foci of acute infarction within the cortical brain throughout the left hemisphere consistent with micro embolic infarctions. No acute  infarctions seen on the right or in the cerebellum.  There are mild chronic small-vessel ischemic changes of the brainstem. The cerebellum is normal. The cerebral hemispheres show generalized atrophy with old small vessel ischemic changes within the white matter. 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 No evidence of mass lesion, hemorrhage, hydrocephalus or extra-axial collection. No pituitary mass. There is mild mucosal inflammation of the paranasal sinuses. No skull or skullbase lesion.  MRA HEAD FINDINGS  The right internal carotid artery is widely patent through the siphon region. The anterior and middle cerebral vessels are patent without correctable proximal stenosis, aneurysm or vascular malformation. More distal branch vessels do show some atherosclerotic narrowing and irregularity.  The left internal carotid artery shows a severe stenosis in the petrous region, 90% or greater. There is the appearance of an intraluminal filling defect beyond that. In the siphon region, the vessel is widely patent. The anterior and middle cerebral vessels are patent, but there are areas of severe narrowing that could be due to atherosclerotic disease or possibly nonocclusive embolic material.  The right vertebral artery is chronically occluded. Left vertebral artery is widely patent to the basilar. No basilar stenosis. Posterior circulation branch vessels are patent. Right PCA receives it supply from the anterior circulation. More distal branch vessels show atherosclerotic irregularity.  IMPRESSION: Punctate foci of acute infarctions scattered throughout the anterior circulation distribution on the left consistent with micro embolic infarctions. Some of these could be watershed.  Chronic small vessel ischemic changes elsewhere throughout the brain. Old left parietal cortical infarction.  Severe focal stenosis of the left petrous carotid, 90% or greater. Possible filling defect distal to that which  could be intraluminal thrombus or signal loss from severe turbulence. Stenoses in the anterior circulation intracranial branches, left worse than right. On the left, these could be atherosclerotic stenoses or conceivably due to nonocclusive embolic disease.  Chronically occluded right vertebral.  Critical Value/emergent results were called by telephone at the time of interpretation on 01/22/2015 at 2:42 pm to Dr. Joseph Berkshire ; Jim Like , who verbally acknowledged these results.   Electronically Signed   By: Nelson Chimes M.D.   On: 01/22/2015 14:44   Mr Brain Wo Contrast  01/22/2015   CLINICAL DATA:  Two day history of right-sided weakness. Initial evaluation.  EXAM: MRI HEAD WITHOUT CONTRAST  MRA HEAD WITHOUT CONTRAST  TECHNIQUE: Multiplanar, multiecho pulse sequences of the brain and surrounding structures were obtained without intravenous contrast. Angiographic images of the head were obtained using MRA technique without contrast.  COMPARISON:  Head CT earlier same day.  MRI 02/27/2012.  FINDINGS: MRI HEAD FINDINGS  Diffusion imaging shows scattered punctate foci of acute infarction within the cortical brain throughout the left hemisphere consistent with micro embolic infarctions. No acute infarctions seen on the right or in the cerebellum.  There are mild chronic small-vessel ischemic changes of the brainstem. The cerebellum is normal. The cerebral hemispheres show generalized atrophy with old small vessel ischemic changes  within the white matter. 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 No evidence of mass lesion, hemorrhage, hydrocephalus or extra-axial collection. No pituitary mass. There is mild mucosal inflammation of the paranasal sinuses. No skull or skullbase lesion.  MRA HEAD FINDINGS  The right internal carotid artery is widely patent through the siphon region. The anterior and middle cerebral vessels are patent without correctable proximal stenosis, aneurysm or  vascular malformation. More distal branch vessels do show some atherosclerotic narrowing and irregularity.  The left internal carotid artery shows a severe stenosis in the petrous region, 90% or greater. There is the appearance of an intraluminal filling defect beyond that. In the siphon region, the vessel is widely patent. The anterior and middle cerebral vessels are patent, but there are areas of severe narrowing that could be due to atherosclerotic disease or possibly nonocclusive embolic material.  The right vertebral artery is chronically occluded. Left vertebral artery is widely patent to the basilar. No basilar stenosis. Posterior circulation branch vessels are patent. Right PCA receives it supply from the anterior circulation. More distal branch vessels show atherosclerotic irregularity.  IMPRESSION: Punctate foci of acute infarctions scattered throughout the anterior circulation distribution on the left consistent with micro embolic infarctions. Some of these could be watershed.  Chronic small vessel ischemic changes elsewhere throughout the brain. Old left parietal cortical infarction.  Severe focal stenosis of the left petrous carotid, 90% or greater. Possible filling defect distal to that which could be intraluminal thrombus or signal loss from severe turbulence. Stenoses in the anterior circulation intracranial branches, left worse than right. On the left, these could be atherosclerotic stenoses or conceivably due to nonocclusive embolic disease.  Chronically occluded right vertebral.  Critical Value/emergent results were called by telephone at the time of interpretation on 01/22/2015 at 2:42 pm to Dr. Joseph Berkshire ; Jim Like , who verbally acknowledged these results.   Electronically Signed   By: Nelson Chimes M.D.   On: 01/22/2015 14:44    Labs:  CBC:  Recent Labs  01/22/15 0853 01/22/15 0905 01/23/15 0033 01/24/15 0254 01/25/15 0135  WBC 6.7  --  7.0 6.4 10.7*  HGB 12.2* 13.3  12.9* 12.2* 11.4*  HCT 37.0* 39.0 39.1 36.6* 34.4*  PLT 338  --  354 333 333    COAGS:  Recent Labs  06/18/14 0359 01/22/15 0853 01/24/15 0254  INR 1.07 1.07 1.08  APTT  --  29 28    BMP:  Recent Labs  01/22/15 0853 01/22/15 0905 01/23/15 0033 01/24/15 0254 01/25/15 0135  NA 138 139 138 137 137  K 3.9 3.8 3.8 3.8 3.6  CL 106 104 104 105 106  CO2 24  --  26 26 24   GLUCOSE 199* 202* 117* 127* 167*  BUN 19 22* 17 19 16   CALCIUM 9.2  --  9.2 8.7* 8.1*  CREATININE 0.90 0.80 1.04 0.98 1.03  GFRNONAA >60  --  >60 >60 >60  GFRAA >60  --  >60 >60 >60    LIVER FUNCTION TESTS:  Recent Labs  06/27/14 1316 01/22/15 0853  BILITOT 0.6 0.7  AST 18 19  ALT 17 15*  ALKPHOS 63 52  PROT 6.3 6.1*  ALBUMIN 4.3 3.9    Assessment and Plan:  L ICA pta 9/8 Doing well Will see 2 week follow up with Dr Estanislado Pandy  Signed: Monia Sabal A 01/25/2015, 2:59 PM   I spent a total of 15 Minutes at the the patient's bedside AND on the  patient's hospital floor or unit, greater than 50% of which was counseling/coordinating care for L ICA stenosis

## 2015-01-25 NOTE — Progress Notes (Signed)
Patient transferred from [place]. Patient alert and oriented x 4. Patient made comfortable.

## 2015-01-25 NOTE — Progress Notes (Signed)
ANTICOAGULATION CONSULT NOTE   Pharmacy Consult for Heparin Indication: post intracranial angioplasty  Allergies  Allergen Reactions  . Atorvastatin     Myalgias  . Crestor [Rosuvastatin]   . Other Other (See Comments)    No  BLOOD PRODUCTS - Pt is [employer]  . Pravastatin     Myalgias  . Simvastatin     Myalgias     Patient Measurements: Height: 6\' 1"  (185.4 cm) Weight: 222 lb 0.1 oz (100.7 kg) IBW/kg (Calculated) : 79.9  Vital Signs: Temp: 97.7 F (36.5 C) (09/09 0000) Temp Source: Oral (09/09 0000) BP: 129/60 mmHg (09/09 0000) Pulse Rate: 58 (09/09 0000)  Labs:  Recent Labs  01/22/15 0853  01/23/15 0033 01/23/15 0815 01/24/15 0254 01/25/15 0135  HGB 12.2*  < > 12.9*  --  12.2* 11.4*  HCT 37.0*  < > 39.1  --  36.6* 34.4*  PLT 338  --  354  --  333 333  APTT 29  --   --   --  28  --   LABPROT 14.1  --   --   --  14.2  --   INR 1.07  --   --   --  1.08  --   HEPARINUNFRC  --   --  0.36 0.49  --  0.13*  CREATININE 0.90  < > 1.04  --  0.98 1.03  < > = values in this interval not displayed.  Estimated Creatinine Clearance: 70.2 mL/min (by C-G formula based on Cr of 1.03).   Assessment: 81 y.o. male s/p L ICA angioplasty. Continues on heparin. Heparin level 0.13 (therapeutic for low goal of 0.1-0.25). No bleeding noted. Heparin to d/c today at 0700.   Goal of Therapy:  Heparin level 0.1-0.25  Monitor platelets by anticoagulation protocol: Yes   Plan:  Continue heparin at 900 units/hr - stops at 0700 this a.m.  Sherlon Handing, PharmD, BCPS Clinical pharmacist, pager [phone] 01/25/2015 3:12 AM

## 2015-01-25 NOTE — Progress Notes (Signed)
OT recommending outpatient OT at dc.  MD:  Please leave order for case manager to arrange, if desired.  Will follow up as ordered.    Reinaldo Raddle, RN, BSN  Trauma/Neuro ICU Case Manager [phone]

## 2015-01-25 NOTE — Progress Notes (Signed)
Pt transferred to 55M 06. He has his glasses. His wife is aware of his new room #.

## 2015-01-25 NOTE — Progress Notes (Signed)
STROKE TEAM PROGRESS NOTE   SUBJECTIVE (INTERVAL HISTORY) His friend is at the bedside.  Pt doing well overnight. Right hand weakness improved, still need outpt OT. Heparin off, on dural antiplatelet. BP 130-140s.   OBJECTIVE Temp:  [97.3 F (36.3 C)-98.3 F (36.8 C)] 98.3 F (36.8 C) (09/09 1518) Pulse Rate:  [51-83] 64 (09/09 1518) Cardiac Rhythm:  [-] Normal sinus rhythm (09/09 0800) Resp:  [0-31] 19 (09/09 1518) BP: (114-159)/(42-89) 145/89 mmHg (09/09 1600) SpO2:  [85 %-97 %] 94 % (09/09 1300) Arterial Line BP: (135-207)/(41-101) 177/52 mmHg (09/09 0800)  CBC:  Recent Labs Lab 01/22/15 0853  01/24/15 0254 01/25/15 0135  WBC 6.7  < > 6.4 10.7*  NEUTROABS 4.2  --   --  7.5  HGB 12.2*  < > 12.2* 11.4*  HCT 37.0*  < > 36.6* 34.4*  MCV 88.9  < > 87.4 87.8  PLT 338  < > 333 333  < > = values in this interval not displayed.  Basic Metabolic Panel:  Recent Labs Lab 01/22/15 0854  01/24/15 0254 01/25/15 0135  NA  --   < > 137 137  K  --   < > 3.8 3.6  CL  --   < > 105 106  CO2  --   < > 26 24  GLUCOSE  --   < > 127* 167*  BUN  --   < > 19 16  CREATININE  --   < > 0.98 1.03  CALCIUM  --   < > 8.7* 8.1*  MG 2.0  --   --   --   < > = values in this interval not displayed.  Lipid Panel:     Component Value Date/Time   CHOL 110 01/22/2015 1457   TRIG 24 01/22/2015 1457   HDL 44 01/22/2015 1457   CHOLHDL 2.5 01/22/2015 1457   VLDL 5 01/22/2015 1457   LDLCALC 61 01/22/2015 1457   HgbA1c:  Lab Results  Component Value Date   HGBA1C 7.8* 01/22/2015   Urine Drug Screen:     Component Value Date/Time   LABOPIA NONE DETECTED 01/22/2015 1031   COCAINSCRNUR NONE DETECTED 01/22/2015 1031   LABBENZ NONE DETECTED 01/22/2015 1031   AMPHETMU NONE DETECTED 01/22/2015 1031   THCU NONE DETECTED 01/22/2015 1031   LABBARB NONE DETECTED 01/22/2015 1031      IMAGING  I have personally reviewed the radiological images below and agree with the radiology  interpretations.  Dg Chest 2 View 01/22/2015    No active cardiopulmonary disease.     Ct Head Wo Contrast 01/22/2015    1. No acute intracranial pathology. 2. Chronic microvascular disease and cerebral atrophy.     Ct Angio Head & Neck W/cm &/or Wo/cm 01/22/2015    High-grade stenosis of the skullbase LEFT ICA at the junction of the horizontal petrous and inferior cavernous segments estimated 90%. No intraluminal filling defect to suggest thrombus. The MR finding of a possible filling defect in the LEFT ICA relates to turbulent flow.  Chronically occluded RIGHT vertebral artery.  Greater than 50% stenosis proximal LEFT ICA, not clearly flow reducing. This tandem lesion does further compromise flow to the LEFT anterior circulation.     Mri & Mra Head Wo Contrast 01/22/2015    Punctate foci of acute infarctions scattered throughout the anterior circulation distribution on the left consistent with micro embolic infarctions. Some of these could be watershed.  Chronic small vessel ischemic changes elsewhere throughout the brain.  Old left parietal cortical infarction.  Severe focal stenosis of the left petrous carotid, 90% or greater. Possible filling defect distal to that which could be intraluminal thrombus or signal loss from severe turbulence. Stenoses in the anterior circulation intracranial branches, left worse than right. On the left, these could be atherosclerotic stenoses or conceivably due to nonocclusive embolic disease.  Chronically occluded right vertebral.   2D echo - - Left ventricle: The cavity size was normal. There was moderate concentric hypertrophy. Systolic function was normal. The estimated ejection fraction was in the range of 60% to 65%. Wall motion was normal; there were no regional wall motion abnormalities. Doppler parameters are consistent with abnormal left ventricular relaxation (grade 1 diastolic dysfunction). - Aortic valve: Trileaflet; normal thickness leaflets. -  Aortic root: The aortic root was normal in size. - Left atrium: The atrium was mildly dilated. - Right ventricle: Systolic function was normal. - Right atrium: The atrium was normal in size. - Tricuspid valve: There was mild regurgitation. - Pulmonic valve: There was no regurgitation. - Pulmonary arteries: Systolic pressure was within the normal range. - Inferior vena cava: The vessel was normal in size. - Pericardium, extracardiac: There was no pericardial effusion.  Cerebral angio -  S/P LT ICA intracranial angioplasty for severe symptomatic stenosis.with <20 % stenosis post angioplasty .   PHYSICAL EXAM  Temp:  [97.3 F (36.3 C)-98.3 F (36.8 C)] 98.3 F (36.8 C) (09/09 1518) Pulse Rate:  [51-83] 64 (09/09 1518) Resp:  [0-31] 19 (09/09 1518) BP: (114-159)/(42-89) 145/89 mmHg (09/09 1600) SpO2:  [85 %-97 %] 94 % (09/09 1300) Arterial Line BP: (135-207)/(41-101) 177/52 mmHg (09/09 0800)  General - Well nourished, well developed, in no apparent distress.  Ophthalmologic - Fundi not visualized due to small pupils.  Cardiovascular - Regular rate and rhythm with no murmur.  Mental Status -  Level of arousal and orientation to time, place, and person were intact. Language including expression, naming, repetition, comprehension was assessed and found intact. Fund of Knowledge was assessed and was intact.  Cranial Nerves II - XII - II - Visual field intact OU. III, IV, VI - Extraocular movements intact. V - Facial sensation intact bilaterally. VII - right nasolabial fold flattening. VIII - Hearing & vestibular intact bilaterally. X - Palate elevates symmetrically. XI - Chin turning & shoulder shrug intact bilaterally. XII - Tongue protrusion intact.  Motor Strength - The patient's strength was normal in all extremities except 3/5 right hand grip, 3/5 wrist extension and 3/5 flexion and pronator drift was absent.  Bulk was normal and fasciculations were absent.   Motor  Tone - Muscle tone was assessed at the neck and appendages and was normal.  Reflexes - The patient's reflexes were 1+ in all extremities and he had no pathological reflexes.  Sensory - Light touch, temperature/pinprick were assessed and were symmetrical except decreased sensation distal to right wrist.    Coordination - The patient had normal movements in the hands and feet with no ataxia or dysmetria.  Tremor was absent.  Gait and Station - not tested due safety concerns.   ASSESSMENT/PLAN Mr. [name] is a 81 y.o. male with history of CAD, HTN, DM, HLD presenting with 2 day history of right sided weakness. He did not receive IV t-PA due to delay in arrival . MRI showed left MCA/PCA and MCA/ACA watershed infarcts, consistent with left petrous portion ICA high grade stenosis. Pt had no hypotension episodes. CTA head and neck ruled out thrombosis  distal to stenosis segment.   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 Pt had left MCA stroke in 2013 and had dizziness episode in 2015. Pt has had no hypotension episodes. Combination of the fact of recurrent left MCA stroke and chronic HTN running in 180s, agree with cerebral angio with potential intervention.   Resultant  Right hand weakness and numbenss, improving  MRI  left punctate scattered infarctions in a watershed distribution. Old L parietal cortical infarct.  MRA  Focal L petrous stenosis 90%. Chronic occluded L VA  CTA head and neck - no thrombosis distal to left petrous ICA stenosis  2D Echo  pending   S/p left ICA angioplasty without stenting needed.  LDL 61  HgbA1c 7.8, not at goal  IV heparin for VTE prophylaxis Diet Carb Modified Fluid consistency:: Thin; Room service appropriate?: Yes  clopidogrel 75 mg orally every day prior to admission, now off heparin and on dural antiplatelet. Continue dural  antiplatelet at discharge.   Ongoing aggressive stroke risk factor management  Therapy recommendations:  outpt OT  Disposition:  Transfer to floor   Left petrous ICA high grade stenosis  Likely the culprit for at least two left hemisphere strokes  Still has watershed infarct this time in the setting of chronic hypertension 180s  No thrombosis on CTA head distal to stenosis  S/p left ICA angioplasty without stenting needed.  On ASA and plavix.   Essential Hypertension  No signs of hypotension on/before arrival  Chronic hypertension 160 -180s at home BP goal < 160 post intervention No BP meds for now.  BP now 130-140s  Hyperlipidemia  Home meds:  crestor 5 mg daily, resumed in hospital  LDL 61, goal < 70  Continue statin at discharge  Diabetes type II  HgbA1c 7.8, goal < 7.0  Uncontrolled  SSI  CBG monitoring  Other Stroke Risk Factors  Advanced age  Former Cigarette smoker, quit smoking 33 years ago   Hx stroke/TIA - 2013 w/ right hand weakness  Coronary artery disease - non-STEMI, CABG x 2  Post op atrial fibrillation   Obstructive sleep apnea, on CPAP at home  AAA  Other [facility] day # 3   Rosalin Hawking, MD PhD Stroke Neurology 01/25/2015 4:29 PM      To contact Stroke Continuity provider, please refer to [email or web address] After hours, contact General Neurology

## 2015-01-25 NOTE — Progress Notes (Signed)
Occupational Therapy Treatment Patient Details Name: LAWTON SILBERT MRN: [id number] DOB: 1930/08/23 Today's Date: 01/25/2015    History of present illness OCIEL MIZE is an 81 y.o. male hx of CAD, HTN, DM, HLD presenting with 2 day history of right sided weakness. Patient notes waking up and feeling like his RUE was asleep with a pins and needles sensation. Notes difficulty moving his hand and grasping items. Symptoms have not improved so he decided to come to the ED   OT comments  Pt demonstrates improved function of Lt hand.  He was instructed in initial HEP for Lt. UE.  Recommend OPOT   Follow Up Recommendations  Outpatient OT    Equipment Recommendations  None recommended by OT    Recommendations for Other Services      Precautions / Restrictions Precautions Precautions: None       Mobility Bed Mobility                  Transfers Overall transfer level: Independent                    Balance                                   ADL                                         General ADL Comments: Pt struggles with fasterns       Vision Eye Alignment: Within Functional Limits Alignment/Gaze Preference: Within Defined Limits Ocular Range of Motion: Within Functional Limits Tracking/Visual Pursuits: Able to track stimulus in all quads without difficulty Saccades: Within functional limits       Additional Comments: Pt denies changes    Perception     Praxis      Cognition   Behavior During Therapy: [facility] for tasks assessed/performed Overall Cognitive Status: Within Functional Limits for tasks assessed                       Extremity/Trunk Assessment               Exercises Other Exercises Other Exercises: Pt  now demonstrates wrist extension and flexion 3+/5.  He was able to perform active reaching for cup in all planes with good aligment, min difficulty with release of object.   Pt was instructed  in intiial HEP with focus on isolated exension of digits and wrist - tapping digits, thumb extension off object and rolling cylindrical object with Lt hand extending digits off roll as he rolls it forward.  He was able to demonstrate understanding and independence with this.  He reports he had a CVA ~ one year ago with resultant impaired coordination of Lt hand and difficulty grading force and grasp with Lt hand    Shoulder Instructions       General Comments      Pertinent Vitals/ Pain       Pain Assessment: No/denies pain  Home Living                                          Prior Functioning/Environment  Frequency Min 2X/week     Progress Toward Goals  OT Goals(current goals can now be found in the care plan section)     ADL Goals Pt/caregiver will Perform Home Exercise Program: Right Upper extremity;With Supervision;With written HEP provided Additional ADL Goal #1: Pt will verbalize understanding of compensatory techniques for decreased functional use of R hand for ADL  Plan Discharge plan remains appropriate    Co-evaluation                 End of Session     Activity Tolerance Patient tolerated treatment well   Patient Left in chair;with call bell/phone within reach;with family/visitor present   Nurse Communication Mobility status        Time: [id number] OT Time Calculation (min): 28 min  Charges: OT General Charges $OT Visit: 1 Procedure OT Treatments $Neuromuscular Re-education: 23-37 mins  Sharlett Lienemann M 01/25/2015, 12:48 PM

## 2015-01-25 NOTE — Progress Notes (Signed)
CM referral for OP occupational therapy.  Faxed referral form and order to [facility], [address], [address].  Fax# [phone].  Pt agreeable to OP therapy; states he has had in the past for his Rt hand, and "[facility] [employer] given map with phone # for rehab center; encouraged pt to call if he has not heard from them by mid-week next week.     Reinaldo Raddle, RN, BSN  Trauma/Neuro ICU Case Manager [phone]

## 2015-01-25 NOTE — Progress Notes (Signed)
Occupational Therapy Treatment Patient Details Name: [name] MRN: [id number] DOB: 03/23/1931 Today's Date: 01/25/2015    History of present illness [name] is an 81 y.o. male hx of CAD, HTN, DM, HLD presenting with 2 day history of right sided weakness. Patient notes waking up and feeling like his RUE was asleep with a pins and needles sensation. Notes difficulty moving his hand and grasping items. Symptoms have not improved so he decided to come to the ED   OT comments  Pt is independent with HEP, and demonstrates progressive improvement with Rt UE function.  Recommend follow up OPOT  Follow Up Recommendations  Outpatient OT    Equipment Recommendations  None recommended by OT    Recommendations for Other Services      Precautions / Restrictions Precautions Precautions: None       Mobility Bed Mobility                  Transfers                      Balance                                   ADL                                                Vision                     Perception     Praxis      Cognition   Behavior During Therapy: WFL for tasks assessed/performed Overall Cognitive Status: Within Functional Limits for tasks assessed                       Extremity/Trunk Assessment               Exercises Other Exercises Other Exercises: Pt reports he has been performing HEP throughout the day and is able to demonstrate independence with HEP provided earlier.   Instructed pt to perform composite finger flexion x 10 reps 1-2/xday as finger flexion is lacking.  Reinforced to him to focus on isolated extension at this time.  He was able to return demonstration    Shoulder Instructions       General Comments      Pertinent Vitals/ Pain       Pain Assessment: No/denies pain  Home Living                                          Prior  Functioning/Environment              Frequency Min 2X/week     Progress Toward Goals  OT Goals(current goals can now be found in the care plan section)        Plan Discharge plan remains appropriate    Co-evaluation                 End of Session     Activity Tolerance Patient tolerated treatment well   Patient Left in chair;with call bell/phone within reach;with family/visitor present   Nurse Communication Mobility status  Time: [id number] OT Time Calculation (min): 22 min  Charges: OT General Charges $OT Visit: 1 Procedure OT Treatments $Neuromuscular Re-education: 8-22 mins  [name] M 01/25/2015, 5:47 PM

## 2015-01-26 ENCOUNTER — Inpatient Hospital Stay (HOSPITAL_COMMUNITY): Payer: Medicare Other

## 2015-01-26 LAB — CBC
HEMATOCRIT: 33 % — AB (ref 39.0–52.0)
HEMOGLOBIN: 11.1 g/dL — AB (ref 13.0–17.0)
MCH: 29.8 pg (ref 26.0–34.0)
MCHC: 33.6 g/dL (ref 30.0–36.0)
MCV: 88.7 fL (ref 78.0–100.0)
Platelets: 317 10*3/uL (ref 150–400)
RBC: 3.72 MIL/uL — ABNORMAL LOW (ref 4.22–5.81)
RDW: 14.9 % (ref 11.5–15.5)
WBC: 6.9 10*3/uL (ref 4.0–10.5)

## 2015-01-26 LAB — GLUCOSE, CAPILLARY
GLUCOSE-CAPILLARY: 134 mg/dL — AB (ref 65–99)
Glucose-Capillary: 166 mg/dL — ABNORMAL HIGH (ref 65–99)

## 2015-01-26 MED ORDER — ASPIRIN 325 MG PO TABS: 325.0000 mg | ORAL_TABLET | Freq: Every day | ORAL | Status: DC

## 2015-01-26 NOTE — Progress Notes (Signed)
STROKE TEAM PROGRESS NOTE   SUBJECTIVE (INTERVAL HISTORY) No family members present. The patient voices no complaints. His right grip is still weak but it is improving.   OBJECTIVE Temp:  [97.6 F (36.4 C)-98.6 F (37 C)] 98.6 F (37 C) (09/10 0950) Pulse Rate:  [57-77] 59 (09/10 0950) Cardiac Rhythm:  [-] Sinus bradycardia;Heart block (09/10 0700) Resp:  [15-31] 17 (09/10 0950) BP: (122-174)/(48-108) 168/73 mmHg (09/10 0950) SpO2:  [94 %-99 %] 98 % (09/10 0950)  CBC:  Recent Labs Lab 01/22/15 0853  01/25/15 0135 01/26/15 0523  WBC 6.7  < > 10.7* 6.9  NEUTROABS 4.2  --  7.5  --   HGB 12.2*  < > 11.4* 11.1*  HCT 37.0*  < > 34.4* 33.0*  MCV 88.9  < > 87.8 88.7  PLT 338  < > 333 317  < > = values in this interval not displayed.  Basic Metabolic Panel:  Recent Labs Lab 01/22/15 0854  01/24/15 0254 01/25/15 0135  NA  --   < > 137 137  K  --   < > 3.8 3.6  CL  --   < > 105 106  CO2  --   < > 26 24  GLUCOSE  --   < > 127* 167*  BUN  --   < > 19 16  CREATININE  --   < > 0.98 1.03  CALCIUM  --   < > 8.7* 8.1*  MG 2.0  --   --   --   < > = values in this interval not displayed.  Lipid Panel:     Component Value Date/Time   CHOL 110 01/22/2015 1457   TRIG 24 01/22/2015 1457   HDL 44 01/22/2015 1457   CHOLHDL 2.5 01/22/2015 1457   VLDL 5 01/22/2015 1457   LDLCALC 61 01/22/2015 1457   HgbA1c:  Lab Results  Component Value Date   HGBA1C 7.8* 01/22/2015   Urine Drug Screen:     Component Value Date/Time   LABOPIA NONE DETECTED 01/22/2015 1031   COCAINSCRNUR NONE DETECTED 01/22/2015 1031   LABBENZ NONE DETECTED 01/22/2015 1031   AMPHETMU NONE DETECTED 01/22/2015 1031   THCU NONE DETECTED 01/22/2015 1031   LABBARB NONE DETECTED 01/22/2015 1031      IMAGING  I have personally reviewed the radiological images below and agree with the radiology interpretations.  Dg Chest 2 View 01/22/2015    No active cardiopulmonary disease.     Ct Head Wo  Contrast 01/22/2015    1. No acute intracranial pathology. 2. Chronic microvascular disease and cerebral atrophy.     Ct Angio Head & Neck W/cm &/or Wo/cm 01/22/2015    High-grade stenosis of the skullbase LEFT ICA at the junction of the horizontal petrous and inferior cavernous segments estimated 90%. No intraluminal filling defect to suggest thrombus. The MR finding of a possible filling defect in the LEFT ICA relates to turbulent flow.  Chronically occluded RIGHT vertebral artery.  Greater than 50% stenosis proximal LEFT ICA, not clearly flow reducing. This tandem lesion does further compromise flow to the LEFT anterior circulation.     Mri & Mra Head Wo Contrast 01/22/2015    Punctate foci of acute infarctions scattered throughout the anterior circulation distribution on the left consistent with micro embolic infarctions. Some of these could be watershed.  Chronic small vessel ischemic changes elsewhere throughout the brain. Old left parietal cortical infarction.  Severe focal stenosis of the left petrous carotid, 90%  or greater. Possible filling defect distal to that which could be intraluminal thrombus or signal loss from severe turbulence. Stenoses in the anterior circulation intracranial branches, left worse than right. On the left, these could be atherosclerotic stenoses or conceivably due to nonocclusive embolic disease.  Chronically occluded right vertebral.   2D echo - - Left ventricle: The cavity size was normal. There was moderate concentric hypertrophy. Systolic function was normal. The estimated ejection fraction was in the range of 60% to 65%. Wall motion was normal; there were no regional wall motion abnormalities. Doppler parameters are consistent with abnormal left ventricular relaxation (grade 1 diastolic dysfunction). - Aortic valve: Trileaflet; normal thickness leaflets. - Aortic root: The aortic root was normal in size. - Left atrium: The atrium was mildly dilated. -  Right ventricle: Systolic function was normal. - Right atrium: The atrium was normal in size. - Tricuspid valve: There was mild regurgitation. - Pulmonic valve: There was no regurgitation. - Pulmonary arteries: Systolic pressure was within the normal range. - Inferior vena cava: The vessel was normal in size. - Pericardium, extracardiac: There was no pericardial effusion.  Cerebral angio -  S/P LT ICA intracranial angioplasty for severe symptomatic stenosis.with <20 % stenosis post angioplasty .   PHYSICAL EXAM  Temp:  [97.6 F (36.4 C)-98.6 F (37 C)] 98.6 F (37 C) (09/10 0950) Pulse Rate:  [57-77] 59 (09/10 0950) Resp:  [15-31] 17 (09/10 0950) BP: (122-174)/(48-108) 168/73 mmHg (09/10 0950) SpO2:  [94 %-99 %] 98 % (09/10 0950)  General - Well nourished, well developed, in no apparent distress.  Ophthalmologic - Fundi not visualized due to small pupils.  Cardiovascular - Regular rate and rhythm with no murmur.  Mental Status -  Level of arousal and orientation to time, place, and person were intact. Language including expression, naming, repetition, comprehension was assessed and found intact. Fund of Knowledge was assessed and was intact.  Cranial Nerves II - XII - II - Visual field intact OU. III, IV, VI - Extraocular movements intact. V - Facial sensation intact bilaterally. VII - right nasolabial fold flattening. VIII - Hearing & vestibular intact bilaterally. X - Palate elevates symmetrically. XI - Chin turning & shoulder shrug intact bilaterally. XII - Tongue protrusion intact.  Motor Strength - The patient's strength was normal in all extremities except 3/5 right hand grip, 3/5 wrist extension and 3/5 flexion and pronator drift was absent.  Bulk was normal and fasciculations were absent.   Motor Tone - Muscle tone was assessed at the neck and appendages and was normal.  Reflexes - The patient's reflexes were 1+ in all extremities and he had no pathological  reflexes.  Sensory - Light touch, temperature/pinprick were assessed and were symmetrical except decreased sensation distal to right wrist.    Coordination - The patient had normal movements in the hands and feet with no ataxia or dysmetria.  Tremor was absent.  Gait and Station - not tested due safety concerns.   ASSESSMENT/PLAN Mr. BARREN GOLDSON is a 81 y.o. male with history of CAD, HTN, DM, HLD presenting with 2 day history of right sided weakness. He did not receive IV t-PA due to delay in arrival . MRI showed left MCA/PCA and MCA/ACA watershed infarcts, consistent with left petrous portion ICA high grade stenosis. Pt had no hypotension episodes. CTA head and neck ruled out thrombosis distal to stenosis segment.   Stroke:  Dominant left MCA/ACA, MCA/PCA infarctions in a watershed distribution in the setting of chronic high  grade L petrous carotid artery stenosis. Pt had left MCA stroke in 2013 and had dizziness episode in 2015. Pt has had no hypotension episodes. Combination of the fact of recurrent left MCA stroke and chronic HTN running in 180s, agree with cerebral angio with potential intervention.   Resultant  Right hand weakness and numbenss, improving  MRI  left punctate scattered infarctions in a watershed distribution. Old L parietal cortical infarct.  MRA  Focal L petrous stenosis 90%. Chronic occluded L VA  CTA head and neck - no thrombosis distal to left petrous ICA stenosis  2D Echo  pending   S/p left ICA angioplasty without stenting needed performed 01/24/2015 by Dr. Estanislado Pandy  LDL 61  HgbA1c 7.8, not at goal  IV heparin for VTE prophylaxis Diet Carb Modified Fluid consistency:: Thin; Room service appropriate?: Yes  clopidogrel 75 mg orally every day prior to admission, now off heparin and on dural antiplatelet. Continue duall antiplatelet at discharge.   Ongoing aggressive stroke risk factor management  Therapy recommendations:  outpt OT  Disposition:   Transfer to floor   Left petrous ICA high grade stenosis  Likely the culprit for at least two left hemisphere strokes  Still has watershed infarct this time in the setting of chronic hypertension 180s  No thrombosis on CTA head distal to stenosis  S/p left ICA angioplasty without stenting needed 01/24/2015  On ASA and plavix.   Essential Hypertension  No signs of hypotension on/before arrival  Chronic hypertension 160 -180s at home BP goal < 160 post intervention No BP meds for now.  BP now 130-140s  Hyperlipidemia  Home meds:  crestor 5 mg daily, resumed in hospital  LDL 61, goal < 70  Continue statin at discharge  Diabetes type II  HgbA1c 7.8, goal < 7.0  Uncontrolled  SSI  CBG monitoring  Other Stroke Risk Factors  Advanced age  Former Cigarette smoker, quit smoking 61 years ago   Hx stroke/TIA - 2013 w/ right hand weakness  Coronary artery disease - non-STEMI, CABG x 2  Post op atrial fibrillation   Obstructive sleep apnea, on CPAP at home  AAA  PLAN  Per Dr. Irish Elders - okay for discharge today  Follow-up Dr. Erlinda Hong in 2 months  Follow-up Dr. Estanislado Pandy in 2 weeks  Continue dual antiplatelet therapy  Outpatient occupational therapy  Hospital day # [place] [facility] Pager [phone] 01/26/2015, 9:51 AM      To contact Stroke Continuity provider, please refer to [email or web address] After hours, contact General Neurology

## 2015-01-26 NOTE — Discharge Summary (Signed)
Stroke Discharge Summary  Patient ID: [name]   MRN: [id number]      DOB: 04-19-1931  Date of Admission: 01/22/2015 Date of Discharge: 01/26/2015  Attending Physician:  Rosalin Hawking, MD, Stroke MD  Consulting Physician(s):     Neuro-interventional radiology Dr Estanislado Pandy Patient's PCP:  Leonides Sake, MD  DISCHARGE DIAGNOSIS: Left MCA/ACA infarctions in a watershed distribution secondary to left internal carotid artery stenosis. Active Problems:   CVA (cerebral infarction)   Stenosis of carotid artery   Essential hypertension   HLD (hyperlipidemia)  BMI: Body mass index is 29.3 kg/(m^2).  Past Medical History  Diagnosis Date  . Non-STEMI (non-ST elevated myocardial infarction) July 2012    1. Ostial LAD 70% (to close to [facility] for PCI), subtotal occlusion of the RCA  . CAD in native artery     a. NSTEMI 11/2010 - CABG x2(LIMA to LAD, SVG to PDA-11/2010). b. NEGATIVE Lexiscan Myoview 6/9/'15, EF 53%, no perfusion abnormality, septal and apical HK noted. C. NSTEMI 05/2014 - s/p DES to SVG-RPDA 06/18/14 (Xience Alpine DES 3.0 x 18 mm -3.35 mm), EF 60-65%.  . 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 Servando Snare)  . Postoperative atrial fibrillation     Post CABG, no sign recurrence  . HTN (hypertension)   . Type II diabetes mellitus with complication   . Hyperlipidemia LDL goal <70   . 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 Refusal of blood transfusions as patient is Jehovah's Witness   . Sleep apnea   . Abdominal aortic aneurysm     a. Aortic duplex 06/2014: mild aneurysmal dilatation of proximal abdominal aorta measuring 3.4x3.4cm. No sig change from 2012. F/u due 06/2016.  . Dizziness     a. Carotid duplex 03/2014: mild fibrous plaque, no significant stenosis.  . Stroke     a. 2014 with mild right hand weakness, nonhemorrhagic per pt.   Past Surgical History  Procedure Laterality Date  . Cardiac catheterization  12/11/2010     Dr. Chase Picket - subsequent cath - normal LV systolic function, no renal artery stenosis, severe 2-vessel disease with subtotaled RCA prox and distal 60% lesion and complex 70% area of narrowing of ostium of LAD  . Coronary artery bypass graft  12/15/2010    X2, Dr Servando Snare; LIMA to LAD, SVG to PDA;   Marland Kitchen Cystoscopy with urethral dilatation    . Transthoracic echocardiogram  07/28/2012    EF 55-60%, severe Conc LVH, Nl Systolic fxn, G1 DD,  Ao Sclerosis w/o stenosis; CRO PFO  . Left heart catheterization with coronary angiogram N/A 06/18/2014    Procedure: LEFT HEART CATHETERIZATION WITH CORONARY ANGIOGRAM;  Surgeon: Leonie Man, MD;  Location: [facility] CATH LAB;  Service: Cardiovascular;  Laterality: N/A;  . Radiology with anesthesia N/A 01/24/2015    Procedure: STENT ASSISTED ANGIOPLASTY (RADIOLOGY WITH ANESTHESIA);  Surgeon: Luanne Bras, MD;  Location: [facility];  Service: Radiology;  Laterality: N/A;      Medication List    STOP taking these medications        hydrALAZINE 100 MG tablet  Commonly known as:  APRESOLINE     labetalol 200 MG tablet  Commonly known as:  NORMODYNE      TAKE these medications        amLODipine 10 MG tablet  Commonly known as:  NORVASC  Take 10 mg by mouth every evening.  aspirin 325 MG tablet  Take 1 tablet (325 mg total) by mouth daily with breakfast.     bisacodyl 5 MG EC tablet  Commonly known as:  DULCOLAX  Take 1 tablet (5 mg total) by mouth daily as needed for moderate constipation.     clopidogrel 75 MG tablet  Commonly known as:  PLAVIX  Take 1 tablet (75 mg total) by mouth daily.     docusate sodium 100 MG capsule  Commonly known as:  COLACE  Take 1 capsule (100 mg total) by mouth 2 (two) times daily.     doxazosin 4 MG tablet  Commonly known as:  CARDURA  Take 1 tablet by mouth daily.     glimepiride 4 MG tablet  Commonly known as:  AMARYL  Take 4 mg by mouth 2 (two) times daily with a meal.     losartan-hydrochlorothiazide  100-25 MG per tablet  Commonly known as:  HYZAAR  Take 1 tablet by mouth every morning.     metFORMIN 1000 MG tablet  Commonly known as:  GLUCOPHAGE  Take 1 tablet by mouth 2 (two) times daily.     nitroGLYCERIN 0.4 MG SL tablet  Commonly known as:  NITROSTAT  Place 1 tablet (0.4 mg total) under the tongue every 5 (five) minutes as needed for chest pain (up to 3 doses).     rosuvastatin 5 MG tablet  Commonly known as:  CRESTOR  Take 1 tablet (5 mg total) by mouth 2 (two) times a week.     SUPER C-500 Tabs  Take 1 tablet by mouth every evening.        LABORATORY STUDIES CBC    Component Value Date/Time   WBC 6.9 01/26/2015 0523   RBC 3.72* 01/26/2015 0523   HGB 11.1* 01/26/2015 0523   HCT 33.0* 01/26/2015 0523   PLT 317 01/26/2015 0523   MCV 88.7 01/26/2015 0523   MCH 29.8 01/26/2015 0523   MCHC 33.6 01/26/2015 0523   RDW 14.9 01/26/2015 0523   LYMPHSABS 1.8 01/25/2015 0135   MONOABS 1.1* 01/25/2015 0135   EOSABS 0.2 01/25/2015 0135   BASOSABS 0.0 01/25/2015 0135   CMP    Component Value Date/Time   NA 137 01/25/2015 0135   K 3.6 01/25/2015 0135   CL 106 01/25/2015 0135   CO2 24 01/25/2015 0135   GLUCOSE 167* 01/25/2015 0135   BUN 16 01/25/2015 0135   CREATININE 1.03 01/25/2015 0135   CREATININE 1.28 06/27/2014 1316   CALCIUM 8.1* 01/25/2015 0135   PROT 6.1* 01/22/2015 0853   ALBUMIN 3.9 01/22/2015 0853   AST 19 01/22/2015 0853   ALT 15* 01/22/2015 0853   ALKPHOS 52 01/22/2015 0853   BILITOT 0.7 01/22/2015 0853   GFRNONAA >60 01/25/2015 0135   GFRAA >60 01/25/2015 0135   COAGS Lab Results  Component Value Date   INR 1.08 01/24/2015   INR 1.07 01/22/2015   INR 1.07 06/18/2014   Lipid Panel    Component Value Date/Time   CHOL 110 01/22/2015 1457   TRIG 24 01/22/2015 1457   HDL 44 01/22/2015 1457   CHOLHDL 2.5 01/22/2015 1457   VLDL 5 01/22/2015 1457   LDLCALC 61 01/22/2015 1457   HgbA1C  Lab Results  Component Value Date   HGBA1C 7.8*  01/22/2015   Cardiac Panel (last 3 results) No results for input(s): CKTOTAL, CKMB, TROPONINI, RELINDX in the last 72 hours. Urinalysis    Component Value Date/Time   COLORURINE YELLOW 01/22/2015 1031  APPEARANCEUR CLOUDY* 01/22/2015 1031   LABSPEC 1.013 01/22/2015 1031   PHURINE 6.5 01/22/2015 1031   GLUCOSEU 100* 01/22/2015 1031   HGBUR NEGATIVE 01/22/2015 1031   BILIRUBINUR NEGATIVE 01/22/2015 1031   KETONESUR NEGATIVE 01/22/2015 1031   PROTEINUR >300* 01/22/2015 1031   UROBILINOGEN 0.2 01/22/2015 1031   NITRITE NEGATIVE 01/22/2015 1031   LEUKOCYTESUR NEGATIVE 01/22/2015 1031   Urine Drug Screen     Component Value Date/Time   LABOPIA NONE DETECTED 01/22/2015 1031   COCAINSCRNUR NONE DETECTED 01/22/2015 1031   LABBENZ NONE DETECTED 01/22/2015 1031   AMPHETMU NONE DETECTED 01/22/2015 1031   THCU NONE DETECTED 01/22/2015 1031   LABBARB NONE DETECTED 01/22/2015 1031    Alcohol Level    Component Value Date/Time   [facility] <5 01/22/2015 0853     SIGNIFICANT DIAGNOSTIC STUDIES  Dg Chest 2 View 01/22/2015 No active cardiopulmonary disease.   Ct Head Wo Contrast 01/22/2015 1. No acute intracranial pathology. 2. Chronic microvascular disease and cerebral atrophy.   Ct Angio Head & Neck W/cm &/or Wo/cm 01/22/2015 High-grade stenosis of the skullbase LEFT ICA at the junction of the horizontal petrous and inferior cavernous segments estimated 90%. No intraluminal filling defect to suggest thrombus. The MR finding of a possible filling defect in the LEFT ICA relates to turbulent flow. Chronically occluded RIGHT vertebral artery. Greater than 50% stenosis proximal LEFT ICA, not clearly flow reducing. This tandem lesion does further compromise flow to the LEFT anterior circulation.   Mri & Mra Head Wo Contrast 01/22/2015 Punctate foci of acute infarctions scattered throughout the anterior circulation distribution on the left consistent with micro embolic infarctions. Some  of these could be watershed. Chronic small vessel ischemic changes elsewhere throughout the brain. Old left parietal cortical infarction. Severe focal stenosis of the left petrous carotid, 90% or greater. Possible filling defect distal to that which could be intraluminal thrombus or signal loss from severe turbulence. Stenoses in the anterior circulation intracranial branches, left worse than right. On the left, these could be atherosclerotic stenoses or conceivably due to nonocclusive embolic disease. Chronically occluded right vertebral.   2D echo - - Left ventricle: The cavity size was normal. There was moderate concentric hypertrophy. Systolic function was normal. The estimated ejection fraction was in the range of 60% to 65%. Wall motion was normal; there were no regional wall motion abnormalities. Doppler parameters are consistent with abnormal left ventricular relaxation (grade 1 diastolic dysfunction). - Aortic valve: Trileaflet; normal thickness leaflets. - Aortic root: The aortic root was normal in size. - Left atrium: The atrium was mildly dilated. - Right ventricle: Systolic function was normal. - Right atrium: The atrium was normal in size. - Tricuspid valve: There was mild regurgitation. - Pulmonic valve: There was no regurgitation. - Pulmonary arteries: Systolic pressure was within the normal range. - Inferior vena cava: The vessel was normal in size. - Pericardium, extracardiac: There was no pericardial effusion.  Cerebral angio - S/P LT ICA intracranial angioplasty for severe symptomatic stenosis.with <20 % stenosis post angioplasty .   MRA of the head without contrast 01/26/2015 IMPRESSION: 1. Marked improvement in the left internal carotid artery at the junction of the petrous precavernous segment. Vascular irregularity persists with less than 50% stenosis. 2. Stable 50% stenosis in the para clinoid left internal carotid artery. 3. Stable moderate  stenosis of the left MCA bifurcation. 4. The right vertebral artery is markedly hypoplastic or occluded. 5. High-grade stenosis of the distal right P2 segment. The right posterior cerebral  artery is of fetal type. 6. Moderate diffuse small vessel disease. Vascular attenuation is more prominent in the left MCA but right MCA branches appear.     HISTORY OF PRESENT ILLNESS [name] is an 81 y.o. male hx of CAD, HTN, DM, HLD presenting with 2 day history of right sided weakness. Patient noted waking up and feeling like his RUE was asleep with a pins and needles sensation. Noted difficulty moving his hand and grasping items. Symptoms did not improve so he decided to come to the ED. Denied any speech or visual deficits, denied LE weakness. No prior TIA or CVA. Noted having "A fib" and was unsure if he takes aspirin or coumadin (of note A fib is not listed in cardiology visits and coumadin not listed as a home med).   CT head imaging reviewed and overall unremarkable.   Date last known well: 01/20/2015 Time last known well: 0800 tPA Given: no, outside tPA window Modified Rankin: Rankin Score=0   HOSPITAL COURSE Mr. [name] is an 81 y.o. male with history of CAD, HTN, DM, HLD presenting with 2 day history of right sided weakness. He did not receive IV t-PA due to delay in arrival . MRI showed left MCA/PCA and MCA/ACA watershed infarcts, consistent with left petrous portion ICA high grade stenosis. Pt had no hypotension episodes. CTA head and neck ruled out thrombosis distal to stenosis segment.   Stroke: Dominant left MCA/ACA, MCA/PCA infarctions in a watershed distribution in the setting of chronic high grade L petrous carotid artery stenosis. Pt had left MCA stroke in 2013 and had dizziness episode in 2015. Pt has had no hypotension episodes. Combination of the fact of recurrent left MCA stroke and chronic HTN running in 180s, agree with cerebral angio with potential intervention.      Resultant Right hand weakness and numbenss, improving  MRI left punctate scattered infarctions in a watershed distribution. Old L parietal cortical infarct.  MRA Focal L petrous stenosis 90%. Chronic occluded L VA  CTA head and neck - no thrombosis distal to left petrous ICA stenosis  2D Echo pending   S/p left ICA angioplasty without stenting needed performed 01/24/2015 by Dr. Estanislado Pandy  LDL 61  HgbA1c 7.8, not at goal  IV heparin for VTE prophylaxis  Diet Carb Modified Fluid consistency:: Thin; Room service appropriate?: Yes  clopidogrel 75 mg orally every day prior to admission, now off heparin and on dural antiplatelet. Continue dual antiplatelet therapy at discharge.  Ongoing aggressive stroke risk factor management  Therapy recommendations: Outpt OT  Disposition: Transfer to floor  Left petrous ICA high grade stenosis  Likely the culprit for at least two left hemisphere strokes  Still has watershed infarct this time in the setting of chronic hypertension 180s  No thrombosis on CTA head distal to stenosis  S/p left ICA angioplasty without stenting needed 01/24/2015 Dr Estanislado Pandy  Now on ASA and plavix.  Essential Hypertension  No signs of hypotension on/before arrival  Chronic hypertension 160 -180s at home  BP goal < 160 post intervention  No BP meds for now.   BP now 130-140s  Resume some of the patient's blood pressure medications at discharge  Early follow-up with primary care physician for treatment of hypertension  Hyperlipidemia  Home meds: crestor 5 mg daily, resumed in hospital  LDL 61, goal < 70  Continue statin at discharge  Diabetes type II  HgbA1c 7.8, goal < 7.0  Uncontrolled  SSI  CBG monitoring  Other Stroke Risk Factors  Advanced age  Former Cigarette smoker, quit smoking 37 years ago   Hx stroke/TIA - 2013 w/ right hand weakness  Coronary artery disease - non-STEMI, CABG x 2  Post op atrial  fibrillation   Obstructive sleep apnea, on CPAP at home  AAA  PLAN  Per Dr. Irish Elders - okay for discharge today  Follow-up Dr. Erlinda Hong in 2 months  Follow-up Dr. Estanislado Pandy in 2 weeks  Follow-up Dr.  Sinclair Grooms dual antiplatelet therapy  Outpatient occupational therapy  DISCHARGE EXAM Blood pressure 176/74, pulse 56, temperature 97.9 F (36.6 C), temperature source Oral, resp. rate 17, height 6\' 1"  (1.854 m), weight 100.7 kg (222 lb 0.1 oz), SpO2 97 %. General - Well nourished, well developed, in no apparent distress.  Ophthalmologic - Fundi not visualized due to small pupils.  Cardiovascular - Regular rate and rhythm with no murmur.  Mental Status -  Level of arousal and orientation to time, place, and person were intact. Language including expression, naming, repetition, comprehension was assessed and found intact. Fund of Knowledge was assessed and was intact.  Cranial Nerves II - XII - II - Visual field intact OU. III, IV, VI - Extraocular movements intact. V - Facial sensation intact bilaterally. VII - right nasolabial fold flattening. VIII - Hearing & vestibular intact bilaterally. X - Palate elevates symmetrically. XI - Chin turning & shoulder shrug intact bilaterally. XII - Tongue protrusion intact.  Motor Strength - The patient's strength was normal in all extremities except 3/5 right hand grip, 3/5 wrist extension and 3/5 flexion and pronator drift was absent. Bulk was normal and fasciculations were absent.  Motor Tone - Muscle tone was assessed at the neck and appendages and was normal.  Reflexes - The patient's reflexes were 1+ in all extremities and he had no pathological reflexes.  Sensory - Light touch, temperature/pinprick were assessed and were symmetrical except decreased sensation distal to right wrist.   Coordination - The patient had normal movements in the hands and feet with no ataxia or dysmetria. Tremor was absent.  Gait and Station - not  tested due safety concerns.  Discharge Diet   Diet Carb Modified Fluid consistency:: Thin; Room service appropriate?: Yes liquids  DISCHARGE PLAN  Disposition:  Discharge  aspirin 325 mg orally every day and clopidogrel 75 mg orally every day for secondary stroke prevention.  Follow-up HAMRICK,MAURA L, MD in 7-10 days for adjustment in blood pressure medications  Follow-up with Dr. Rosalin Hawking [facility] in 2 months.  Follow-up with Dr. Estanislado Pandy in 2 weeks  30 minutes were spent preparing discharge.  Mikey Bussing PA-C [facility] Pager [phone] 01/26/2015, 2:44 PM

## 2015-01-26 NOTE — Discharge Instructions (Signed)
Metformin and X-ray Contrast Studies For some X-ray exams, a contrast dye is used. Contrast dye is a type of medicine used to make the X-ray image clearer. The contrast dye is given to the patient through a vein (intravenously). If you need to have this type of X-ray exam and you take a medication called metformin, your caregiver may have you stop taking metformin before the exam.  LACTIC ACIDOSIS In rare cases, a serious medical condition called lactic acidosis can develop in people who take metformin and receive contrast dye. The following conditions can increase the risk of this complication:   Kidney failure.  Liver problems.  Certain types of heart problems such as:  Heart failure.  Heart attack.  Heart infection.  Heart valve problems.  Alcohol abuse. If left untreated, lactic acidosis can lead to coma.  SYMPTOMS OF LACTIC ACIDOSIS Symptoms of lactic acidosis can include:  Rapid breathing (hyperventilation).  Neurologic symptoms such as:  Headaches.  Confusion.  Dizziness.  Excessive sweating.  Feeling sick to your stomach (nauseous) or throwing up (vomiting). AFTER THE X-RAY EXAM  Stay well-hydrated. Drink fluids as instructed by your caregiver.  If you have a risk of developing lactic acidosis, blood tests may be done to make sure your kidney function is okay.  Metformin is usually stopped for 48 hours after the X-ray exam. Ask your caregiver when you can start taking metformin again. SEEK MEDICAL CARE IF:   You have shortness of breath or difficulty breathing.  You develop a headache that does not go away.  You have nausea or vomiting.  You urinate more than normal.  You develop a skin rash and have:  Redness.  Swelling.  Itching. Document Released: 04/22/2009 Document Revised: 07/27/2011 Document Reviewed: 04/22/2009 [facility] Patient Information 2015 [place], [place]. This information is not intended to replace advice given to you by your health  care provider. Make sure you discuss any questions you have with your health care provider. 1) See your primary care physician within 7-10 days for adjustment of blood pressure medications and for a Bmet blood test. 2.) Monitor blood pressure at home twice daily if you have a blood pressure monitoring device. 3) Notify Dr. Arlean Hopping office immediately if problems with catheter groin site. 4) Monitor glucose levels at home at least twice daily. 5) Increase activity slowly. Nothing strenuous until cleared by your physicians. 6) Outpatient occupational therapy will be arranged 7) Did not restart Glucophage until Sunday.

## 2015-01-26 NOTE — Progress Notes (Signed)
Message left with CM that patient needs HHOT.

## 2015-01-26 NOTE — Progress Notes (Signed)
Patient and family given DC instructions,prescriptions, handouts, and Stroke book. All questions answered. Patient in [facility] escorted to lobby by staff. Family to transport home in private vehicle.

## 2015-01-29 ENCOUNTER — Telehealth: Payer: Self-pay | Admitting: Neurology

## 2015-01-29 ENCOUNTER — Other Ambulatory Visit: Payer: Self-pay | Admitting: *Deleted

## 2015-01-29 ENCOUNTER — Other Ambulatory Visit (HOSPITAL_COMMUNITY): Payer: Self-pay | Admitting: Interventional Radiology

## 2015-01-29 DIAGNOSIS — I63219 Cerebral infarction due to unspecified occlusion or stenosis of unspecified vertebral arteries: Secondary | ICD-10-CM

## 2015-01-29 DIAGNOSIS — I639 Cerebral infarction, unspecified: Secondary | ICD-10-CM

## 2015-01-29 NOTE — Telephone Encounter (Signed)
Wife called to schedule 2 month hospital from stroke 01/22/15. Appointment scheduled Thursday 03/21/15. Wife was told by [facility] that our office would need to send referral in order for rehab to be scheduled.

## 2015-01-29 NOTE — Telephone Encounter (Signed)
Spoke to [employer] at [employer].  She did receive in there workque.  She will give them a call.

## 2015-01-29 NOTE — Progress Notes (Signed)
Thanks, Katrina.  Rosalin Hawking, MD PhD Stroke Neurology 01/29/2015 4:41 PM

## 2015-01-31 ENCOUNTER — Ambulatory Visit: Payer: Medicare Other | Attending: Neurology | Admitting: Occupational Therapy

## 2015-01-31 DIAGNOSIS — I6931 Cognitive deficits following cerebral infarction: Secondary | ICD-10-CM | POA: Insufficient documentation

## 2015-01-31 DIAGNOSIS — I69898 Other sequelae of other cerebrovascular disease: Secondary | ICD-10-CM | POA: Insufficient documentation

## 2015-01-31 DIAGNOSIS — R531 Weakness: Secondary | ICD-10-CM | POA: Diagnosis not present

## 2015-01-31 DIAGNOSIS — R279 Unspecified lack of coordination: Secondary | ICD-10-CM | POA: Insufficient documentation

## 2015-01-31 DIAGNOSIS — IMO0002 Reserved for concepts with insufficient information to code with codable children: Secondary | ICD-10-CM

## 2015-01-31 DIAGNOSIS — R278 Other lack of coordination: Secondary | ICD-10-CM

## 2015-01-31 DIAGNOSIS — R201 Hypoesthesia of skin: Secondary | ICD-10-CM | POA: Diagnosis not present

## 2015-01-31 DIAGNOSIS — I69319 Unspecified symptoms and signs involving cognitive functions following cerebral infarction: Secondary | ICD-10-CM

## 2015-01-31 NOTE — Therapy (Signed)
[facility] [address] [place], [place], [address] Phone: [phone]   Fax:  [phone]  Occupational Therapy Evaluation  Patient Details  Name: [name] MRN: [id number] Date of Birth: 12-Feb-1931 Referring Provider:  Leonides Sake, MD  Encounter Date: 01/31/2015      OT End of Session - 01/31/15 0952    Visit Number 1   Number of Visits 17   Date for OT Re-Evaluation 03/31/15   Authorization Type Medicare   Authorization - Visit Number 1   Authorization - Number of Visits 10   OT Start Time 0806   OT Stop Time 0845   OT Time Calculation (min) 39 min   Activity Tolerance Patient tolerated treatment well   Behavior During Therapy [facility] for tasks assessed/performed      Past Medical History  Diagnosis Date  . Non-STEMI (non-ST elevated myocardial infarction) July 2012    1. Ostial LAD 70% (to close to [facility] for PCI), subtotal occlusion of the RCA  . CAD in native artery     a. NSTEMI 11/2010 - CABG x2(LIMA to LAD, SVG to PDA-11/2010). b. NEGATIVE Lexiscan Myoview 6/9/'15, EF 53%, no perfusion abnormality, septal and apical HK noted. C. NSTEMI 05/2014 - s/p DES to SVG-RPDA 06/18/14 (Xience Alpine DES 3.0 x 18 mm -3.35 mm), EF 60-65%.  . 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 Servando Snare)  . Postoperative atrial fibrillation     Post CABG, no sign recurrence  . HTN (hypertension)   . Type II diabetes mellitus with complication   . Hyperlipidemia LDL goal <70   . 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 Refusal of blood transfusions as patient is Jehovah's Witness   . Sleep apnea   . Abdominal aortic aneurysm     a. Aortic duplex 06/2014: mild aneurysmal dilatation of proximal abdominal aorta measuring 3.4x3.4cm. No sig change from 2012. F/u due 06/2016.  . Dizziness     a. Carotid duplex 03/2014: mild fibrous plaque, no significant stenosis.  . Stroke     a.  2014 with mild right hand weakness, nonhemorrhagic per pt.    Past Surgical History  Procedure Laterality Date  . Cardiac catheterization  12/11/2010    Dr. Chase Picket - subsequent cath - normal LV systolic function, no renal artery stenosis, severe 2-vessel disease with subtotaled RCA prox and distal 60% lesion and complex 70% area of narrowing of ostium of LAD  . Coronary artery bypass graft  12/15/2010    X2, Dr Servando Snare; LIMA to LAD, SVG to PDA;   Marland Kitchen Cystoscopy with urethral dilatation    . Transthoracic echocardiogram  07/28/2012    EF 55-60%, severe Conc LVH, Nl Systolic fxn, G1 DD,  Ao Sclerosis w/o stenosis; CRO PFO  . Left heart catheterization with coronary angiogram N/A 06/18/2014    Procedure: LEFT HEART CATHETERIZATION WITH CORONARY ANGIOGRAM;  Surgeon: Leonie Man, MD;  Location: [facility] CATH LAB;  Service: Cardiovascular;  Laterality: N/A;  . Radiology with anesthesia N/A 01/24/2015    Procedure: STENT ASSISTED ANGIOPLASTY (RADIOLOGY WITH ANESTHESIA);  Surgeon: Luanne Bras, MD;  Location: [place];  Service: Radiology;  Laterality: N/A;    There were no vitals filed for this visit.  Visit Diagnosis:  Weakness due to cerebrovascular accident - Plan: Ot plan of care cert/re-cert  Decreased coordination - Plan: Ot plan of care cert/re-cert  Impaired sensation -  Plan: Ot plan of care cert/re-cert  Cognitive deficits following cerebral infarction - Plan: Ot plan of care cert/re-cert      Subjective Assessment - 01/31/15 0809    Subjective  Pt reports he wants to use right hand   Pertinent History see Epic   Patient Stated Goals use RUE   Currently in Pain? Yes   Pain Score 6    Pain Location Shoulder   Pain Orientation Right   Aggravating Factors  sleeping on it   Pain Relieving Factors repositioning   Multiple Pain Sites No           [facility] OT Assessment - 01/31/15 0001    Assessment   Diagnosis CVA   Onset Date 01/22/15   Assessment Pt was hospitalized on  01/22/15 with a 2 day hx of right sided weakness.   Prior Therapy acute OT   Precautions   Precautions Fall   Balance Screen   Has the patient fallen in the past 6 months No   Has the patient had a decrease in activity level because of a fear of falling?  No   Is the patient reluctant to leave their home because of a fear of falling?  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   ADL   ADL comments modified independent with all basic ADLS except for fastening  buttons  per pt report   IADL   Light Housekeeping Performs light daily tasks such as dishwashing, bed making  daughter is assisting   Meal Prep Needs to have meals prepared and served  Pt's daughter is assisting with cooking and house   Mobility   Mobility Status Independent   Written Expression   Dominant Hand Left   Vision - History   Baseline Vision Bifocals   Patient Visual Report Other (comment)  denies visual changes   Vision Assessment   Comment Pt denies visual changes to vision   Cognition   Overall Cognitive Status Within Functional Limits for tasks assessed   Mini Mental State Exam  transposed o and r when spelling world backwards   Memory Impaired  mild   Memory Impairment Decreased short term memory  recalls 1/3 words with short delay   Decreased Short Term Memory --  Pt reports this is longstanding   Sensation   Light Touch Impaired by gross assessment   Additional Comments Therapist discussed precautions related to sensory impairment with pt/ daughter   Coordination   Gross Motor Movements are Fluid and Coordinated No   9 Hole Peg Test Right   Right 9 Hole Peg Test unable   Box and Blocks unable with RUE   ROM / Strength   AROM / PROM /  Strength AROM   AROM   Overall AROM  Deficits   Overall AROM Comments right shoulder flex: 115, abduct 115, elbow and forearm WFLS, wrist ext 40, grossly 25% composite finger flexion, 75% ext, Pt demonstrates grossly 75% thumb radial abduction and only trace flexion, Pt is unable to perform opposition.   Hand Function   Right Hand Grip (lbs) unable to register    Left Hand Grip (lbs) 75 lbs      Treatment: Pt was  issued a d-ring splint and instructed in wear, care and precautions. Therapist provided education regarding sensory precautions for RUE. Pt is not cleared to drive until next week by MD. Therapist recommended pt drivis with his daughter initally prior to driving independently after next week.Pt and daughter verbalized understanding of all education.                      OT Short Term Goals - 01/31/15 1001    OT SHORT TERM GOAL #1   Title I with inital HEP.   Baseline 03/01/15   Time 4   Period Weeks   Status New   OT SHORT TERM GOAL #2   Title I with RUE positioning to minimize pain and risk for injury, including splint wear, care PRN and verbalize understand ing of sensory precautions.   Time 4   Period Weeks   Status New   OT SHORT TERM GOAL #3   Title Pt will demonstrate 120 shoulder flexion with RUE with pain less than or equal to 3/10 for improved functional reach.   Time 4   Period Weeks   Status New   OT SHORT TERM GOAL #4   Title Pt will demonstrate 50 % composite finger flexion with RUE in prep for functional grasp.   Time 4   Status New   OT SHORT TERM GOAL #5   Title Pt will report ability to use RUE as a stabilizer/ gross A at least 25 % of the time for ADLs/ IADLs.   Time 4   Period Weeks   Status New   Additional Short Term Goals   Additional Short Term Goals Yes   OT SHORT TERM GOAL #6   Title Pt will verbzalize understanding of compensatory strategies for short term memory deficits.   Time 4   Period Weeks   Status New            OT Long Term Goals - 01/31/15 1004    OT LONG TERM GOAL #1   Title Pt will demonstrate improved RUE functional use as eveidenced by performing 5 blocks on box/ blocks test.   Time 8   Period Weeks   Status New   OT LONG TERM GOAL #2   Title Pt will demonstrate wrist extension WFLS for ADLs.   Time 8   Period Weeks   Status New   OT LONG TERM GOAL #3   Title Pt will use RUE as a gross A / stabilizer at least 50 % of the time for ADLS/ IADLs with pain less than or equal  to 3/10.   Time 8   Period Weeks   Status New   OT LONG TERM GOAL #4   Title Pt will perform light home management/ cooking at a modified independent level demonstrating good safety awareness.   Time 8   Period Weeks   Status New               Plan - 01/31/15 1247    Clinical Impression Statement Pt hospitalized with CVA on 01/22/15 presents with RUE weakness, decreased coordination and sensory impairment which impedes functional use for ADLs.   Pt will benefit from skilled therapeutic intervention in order to improve on the following deficits (Retired) Abnormal gait;Decreased coordination;Decreased range of motion;Impaired sensation;Increased edema;Decreased safety awareness;Decreased endurance;Decreased activity tolerance;Pain;Impaired UE functional use;Decreased knowledge of use of DME;Decreased balance;Decreased cognition;Decreased mobility;Decreased strength   Rehab Potential Good   Clinical Impairments Affecting Rehab Potential sensory  impairment   OT Frequency 2x / week  plus eval   OT Treatment/Interventions Self-care/ADL training;Therapeutic exercise;Functional Mobility Training;Patient/family education;Splinting;Balance training;Manual Therapy;Neuromuscular education;Ultrasound;Therapeutic exercises;Therapeutic activities;DME and/or AE instruction;Parrafin;Cryotherapy;Electrical Stimulation;Fluidtherapy;Scar mobilization;Cognitive remediation/compensation;Passive range of motion;Contrast Bath;Moist  Heat   Plan issue inital HEP, resting hand splint for night   Consulted and Agree with Plan of Care Patient;Family member/caregiver   Family Member Consulted daughter          G-Codes - 02-26-15 1000    Functional Assessment Tool Used Box/ blocks unable to perform with RUE, trace finger flexion, 75% gross finger ext, 40 wrist ext.   Functional Limitation Carrying, moving and handling objects   Carrying, Moving and Handling Objects Current Status [phone]) At least 76 percent but less than 100 percent impaired, limited or restricted   Carrying, Moving and Handling Objects Goal Status [id number]) At least 40 percent but less than 60 percent impaired, limited or restricted      Problem List Patient Active Problem List   Diagnosis Date Noted  . Stenosis of carotid artery   . Essential hypertension   . HLD (hyperlipidemia)   . CVA (cerebral infarction) 01/22/2015  . CAD S/P percutaneous coronary angioplasty 09/19/2014  . Coronary artery disease involving coronary bypass graft of native heart with other forms of angina pectoris 09/19/2014  . Shortness of breath 09/19/2014  . Polypharmacy 09/19/2014  . Abdominal aortic aneurysm   . Stroke   . Hyperlipidemia LDL goal <70   . Type II diabetes mellitus with complication   . Coronary artery disease involving native coronary artery   . Abnormal TSH 06/19/2014  . Obesity 06/19/2014  . Bilateral lower extremity edema 03/30/2014  . Dizziness 02/21/2014  . Bradycardia 08/31/2013  . Postoperative atrial fibrillation     Class: Temporary  . Diabetes mellitus type 2 in obese   . Moderate essential hypertension   . Apnea, sleep   . Balanitis xerotica obliterans   . History of: Non-STEMI (non-ST elevated myocardial infarction) 11/16/2010    Class: History of  . S/P CABG x 2 11/16/2010    Pernella Ackerley 02/26/15, 12:51 PM Theone Murdoch, OTR/L [phone] Phone: [phone] 12:51 PM 02/26/2015 [facility] [address] [place], [place], [address] Phone: [phone]   Fax:  [phone]

## 2015-02-05 DIAGNOSIS — I639 Cerebral infarction, unspecified: Secondary | ICD-10-CM | POA: Diagnosis not present

## 2015-02-05 DIAGNOSIS — Z6829 Body mass index (BMI) 29.0-29.9, adult: Secondary | ICD-10-CM | POA: Diagnosis not present

## 2015-02-05 DIAGNOSIS — Z79899 Other long term (current) drug therapy: Secondary | ICD-10-CM | POA: Diagnosis not present

## 2015-02-05 DIAGNOSIS — I10 Essential (primary) hypertension: Secondary | ICD-10-CM | POA: Diagnosis not present

## 2015-02-05 DIAGNOSIS — Z9181 History of falling: Secondary | ICD-10-CM | POA: Diagnosis not present

## 2015-02-05 DIAGNOSIS — Z1389 Encounter for screening for other disorder: Secondary | ICD-10-CM | POA: Diagnosis not present

## 2015-02-07 ENCOUNTER — Ambulatory Visit: Payer: Medicare Other | Admitting: Occupational Therapy

## 2015-02-07 DIAGNOSIS — R279 Unspecified lack of coordination: Secondary | ICD-10-CM

## 2015-02-07 DIAGNOSIS — R531 Weakness: Secondary | ICD-10-CM | POA: Diagnosis not present

## 2015-02-07 DIAGNOSIS — I6931 Cognitive deficits following cerebral infarction: Secondary | ICD-10-CM | POA: Diagnosis not present

## 2015-02-07 DIAGNOSIS — R201 Hypoesthesia of skin: Secondary | ICD-10-CM

## 2015-02-07 DIAGNOSIS — R278 Other lack of coordination: Secondary | ICD-10-CM

## 2015-02-07 DIAGNOSIS — IMO0002 Reserved for concepts with insufficient information to code with codable children: Secondary | ICD-10-CM

## 2015-02-07 DIAGNOSIS — I69898 Other sequelae of other cerebrovascular disease: Secondary | ICD-10-CM | POA: Diagnosis not present

## 2015-02-07 NOTE — Patient Instructions (Signed)
Hang wrist and hand over the edge of a chair , extend wrist 10-20 reps 2x day  Practice opening and closing fingers 10-20 reps 2x day,   then bend fingers assisting with your other hand try to hold in place for 5 secs 10 reps  Practice bending tip joint of thumb  10 reps 2x day then bend thumb to touch palm 10-20 reps 2x day  Practice reaching for a plastic cup, open hand and grasp cup, bring cup to your mouth, then back to table, release cup fully with fingers and thumb, 10 reps 1-2x day  Practice flipping cards with right hand.

## 2015-02-08 NOTE — Therapy (Signed)
[facility] [address] [place], [place], [address] Phone: [phone]   Fax:  [phone]  Occupational Therapy Treatment  Patient Details  Name: [name] MRN: [id number] Date of Birth: [birth year] Referring Provider:  Leonides Sake, MD  Encounter Date: 02/07/2015      OT End of Session - 02/08/15 1722    Visit Number 2   Number of Visits 17   Date for OT Re-Evaluation 03/31/15   Authorization Type Medicare   Authorization - Visit Number 1   Authorization - Number of Visits 10   OT Start Time 1107   OT Stop Time 1145   OT Time Calculation (min) 38 min   Activity Tolerance Patient tolerated treatment well   Behavior During Therapy [facility] for tasks assessed/performed      Past Medical History  Diagnosis Date  . Non-STEMI (non-ST elevated myocardial infarction) July 2012    1. Ostial LAD 70% (to close to [facility] for PCI), subtotal occlusion of the RCA  . CAD in native artery     a. NSTEMI 11/2010 - CABG x2(LIMA to LAD, SVG to PDA-11/2010). b. NEGATIVE Lexiscan Myoview 6/9/'15, EF 53%, no perfusion abnormality, septal and apical HK noted. C. NSTEMI 05/2014 - s/p DES to SVG-RPDA 06/18/14 (Xience Alpine DES 3.0 x 18 mm -3.35 mm), EF 60-65%.  . 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 Servando Snare)  . Postoperative atrial fibrillation     Post CABG, no sign recurrence  . HTN (hypertension)   . Type II diabetes mellitus with complication   . Hyperlipidemia LDL goal <70   . 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 Refusal of blood transfusions as patient is Jehovah's Witness   . Sleep apnea   . Abdominal aortic aneurysm     a. Aortic duplex 06/2014: mild aneurysmal dilatation of proximal abdominal aorta measuring 3.4x3.4cm. No sig change from 2012. F/u due 06/2016.  . Dizziness     a. Carotid duplex 03/2014: mild fibrous plaque, no significant stenosis.  . Stroke     a. 2014  with mild right hand weakness, nonhemorrhagic per pt.    Past Surgical History  Procedure Laterality Date  . Cardiac catheterization  12/11/2010    Dr. Chase Picket - subsequent cath - normal LV systolic function, no renal artery stenosis, severe 2-vessel disease with subtotaled RCA prox and distal 60% lesion and complex 70% area of narrowing of ostium of LAD  . Coronary artery bypass graft  12/15/2010    X2, Dr Servando Snare; LIMA to LAD, SVG to PDA;   Marland Kitchen Cystoscopy with urethral dilatation    . Transthoracic echocardiogram  07/28/2012    EF 55-60%, severe Conc LVH, Nl Systolic fxn, G1 DD,  Ao Sclerosis w/o stenosis; CRO PFO  . Left heart catheterization with coronary angiogram N/A 06/18/2014    Procedure: LEFT HEART CATHETERIZATION WITH CORONARY ANGIOGRAM;  Surgeon: Leonie Man, MD;  Location: [facility] CATH LAB;  Service: Cardiovascular;  Laterality: N/A;  . Radiology with anesthesia N/A 01/24/2015    Procedure: STENT ASSISTED ANGIOPLASTY (RADIOLOGY WITH ANESTHESIA);  Surgeon: Luanne Bras, MD;  Location: [facility];  Service: Radiology;  Laterality: N/A;    There were no vitals filed for this visit.  Visit Diagnosis:  Weakness due to cerebrovascular accident  Decreased coordination  Impaired sensation      Subjective Assessment - 02/08/15 1718    Subjective  Pt reports he has been using right hand   Pertinent History see Epic   Patient Stated Goals use RUE   Currently in Pain? Yes   Pain Score 3    Pain Location Shoulder   Pain Orientation Right   Pain Descriptors / Indicators Aching   Pain Type Acute pain   Pain Onset 1 to 4 weeks ago   Aggravating Factors  malposisitioning   Pain Relieving Factors reposistioning   Multiple Pain Sites No        Treatment: Pt was instructed in initial HEP for LUE functional use/ A/ROM (see pt instructions). Pt returned demonstration with min v.c./ facilitation. Pt demonstrates improved wrist ext.today.                      OT  Education - 02/08/15 1721    Education provided Yes   Education Details HEP   Person(s) Educated Patient   Methods Explanation;Demonstration;Verbal cues;Handout   Comprehension Verbalized understanding;Returned demonstration          OT Short Term Goals - 01/31/15 1001    OT SHORT TERM GOAL #1   Title I with inital HEP.   Baseline 03/01/15   Time 4   Period Weeks   Status New   OT SHORT TERM GOAL #2   Title I with RUE positioning to minimize pain and risk for injury, including splint wear, care PRN and verbalize understand ing of sensory precautions.   Time 4   Period Weeks   Status New   OT SHORT TERM GOAL #3   Title Pt will demonstrate 120 shoulder flexion with RUE with pain less than or equal to 3/10 for improved functional reach.   Time 4   Period Weeks   Status New   OT SHORT TERM GOAL #4   Title Pt will demonstrate 50 % composite finger flexion with RUE in prep for functional grasp.   Time 4   Status New   OT SHORT TERM GOAL #5   Title Pt will report ability to use RUE as a stabilizer/ gross A at least 25 % of the time for ADLs/ IADLs.   Time 4   Period Weeks   Status New   Additional Short Term Goals   Additional Short Term Goals Yes   OT SHORT TERM GOAL #6   Title Pt will verbzalize understanding of compensatory strategies for short term memory deficits.   Time 4   Period Weeks   Status New           OT Long Term Goals - 01/31/15 1004    OT LONG TERM GOAL #1   Title Pt will demonstrate improved RUE functional use as eveidenced by performing 5 blocks on box/ blocks test.   Time 8   Period Weeks   Status New   OT LONG TERM GOAL #2   Title Pt will demonstrate wrist extension WFLS for ADLs.   Time 8   Period Weeks   Status New   OT LONG TERM GOAL #3   Title Pt will use RUE as a gross A / stabilizer at least 50 % of the time for ADLS/ IADLs with pain less than or equal  to 3/10.   Time 8   Period Weeks   Status New   OT LONG TERM GOAL #4   Title  Pt will perform light home management/ cooking at a modified independent level demonstrating good safety awareness.   Time 8  Period Weeks   Status New               Plan - 02/08/15 1719    Clinical Impression Statement Pt demonstrates understanding of inital HEP. He demonstrates improving RUE functional use.   Pt will benefit from skilled therapeutic intervention in order to improve on the following deficits (Retired) Abnormal gait;Decreased coordination;Decreased range of motion;Impaired sensation;Increased edema;Decreased safety awareness;Decreased endurance;Decreased activity tolerance;Pain;Impaired UE functional use;Decreased knowledge of use of DME;Decreased balance;Decreased cognition;Decreased mobility;Decreased strength   Rehab Potential Good   Clinical Impairments Affecting Rehab Potential sensory impairment   OT Frequency 2x / week   OT Duration 8 weeks   OT Treatment/Interventions Self-care/ADL training;Therapeutic exercise;Functional Mobility Training;Patient/family education;Splinting;Balance training;Manual Therapy;Neuromuscular education;Ultrasound;Therapeutic exercises;Therapeutic activities;DME and/or AE instruction;Parrafin;Cryotherapy;Electrical Stimulation;Fluidtherapy;Scar mobilization;Cognitive remediation/compensation;Passive range of motion;Contrast Bath;Moist Heat   Plan reinforce/ progress HEP, assess whether pt needs resting hand splint for night time   OT Home Exercise Plan issued inital HEP 9/22   Consulted and Agree with Plan of Care Patient        Problem List Patient Active Problem List   Diagnosis Date Noted  . Stenosis of carotid artery   . Essential hypertension   . HLD (hyperlipidemia)   . CVA (cerebral infarction) 01/22/2015  . CAD S/P percutaneous coronary angioplasty 09/19/2014  . Coronary artery disease involving coronary bypass graft of native heart with other forms of angina pectoris 09/19/2014  . Shortness of breath 09/19/2014  .  Polypharmacy 09/19/2014  . Abdominal aortic aneurysm   . Stroke   . Hyperlipidemia LDL goal <70   . Type II diabetes mellitus with complication   . Coronary artery disease involving native coronary artery   . Abnormal TSH 06/19/2014  . Obesity 06/19/2014  . Bilateral lower extremity edema 03/30/2014  . Dizziness 02/21/2014  . Bradycardia 08/31/2013  . Postoperative atrial fibrillation     Class: Temporary  . Diabetes mellitus type 2 in obese   . Moderate essential hypertension   . Apnea, sleep   . Balanitis xerotica obliterans   . History of: Non-STEMI (non-ST elevated myocardial infarction) 11/16/2010    Class: History of  . S/P CABG x 2 11/16/2010    RINE,KATHRYN 02/08/2015, 5:23 PM Theone Murdoch, OTR/L [phone] Phone: [phone] 5:23 PM 02/08/2015 [facility] [address] [place], [place], [address] Phone: [phone]   Fax:  [phone]

## 2015-02-12 ENCOUNTER — Ambulatory Visit (HOSPITAL_COMMUNITY)
Admission: RE | Admit: 2015-02-12 | Discharge: 2015-02-12 | Disposition: A | Discharge status: Home / Self Care | Payer: Medicare Other | Source: Ambulatory Visit | Attending: Interventional Radiology | Admitting: Interventional Radiology

## 2015-02-12 ENCOUNTER — Ambulatory Visit: Payer: Medicare Other | Admitting: Occupational Therapy

## 2015-02-12 ENCOUNTER — Other Ambulatory Visit: Payer: Self-pay | Admitting: Radiology

## 2015-02-12 ENCOUNTER — Encounter: Payer: Self-pay | Admitting: Occupational Therapy

## 2015-02-12 DIAGNOSIS — R279 Unspecified lack of coordination: Secondary | ICD-10-CM | POA: Diagnosis not present

## 2015-02-12 DIAGNOSIS — I6522 Occlusion and stenosis of left carotid artery: Secondary | ICD-10-CM | POA: Diagnosis not present

## 2015-02-12 DIAGNOSIS — R201 Hypoesthesia of skin: Secondary | ICD-10-CM

## 2015-02-12 DIAGNOSIS — R531 Weakness: Secondary | ICD-10-CM | POA: Diagnosis not present

## 2015-02-12 DIAGNOSIS — I69898 Other sequelae of other cerebrovascular disease: Secondary | ICD-10-CM | POA: Diagnosis not present

## 2015-02-12 DIAGNOSIS — I639 Cerebral infarction, unspecified: Secondary | ICD-10-CM

## 2015-02-12 DIAGNOSIS — I6931 Cognitive deficits following cerebral infarction: Secondary | ICD-10-CM | POA: Diagnosis not present

## 2015-02-12 DIAGNOSIS — R278 Other lack of coordination: Secondary | ICD-10-CM

## 2015-02-12 DIAGNOSIS — IMO0002 Reserved for concepts with insufficient information to code with codable children: Secondary | ICD-10-CM

## 2015-02-12 LAB — PLATELET INHIBITION P2Y12: Platelet Function  P2Y12: 1 [PRU] — ABNORMAL LOW (ref 194–418)

## 2015-02-12 NOTE — Patient Instructions (Addendum)
Basic Activities:   Use your affected hand to perform the following activities for 20-30 minutes 1-2 times/day.  Stop activity if you experience pain.  - Wipe table top - Pick up cotton balls/bottle caps and place in a container.  Progress to smaller objects (paperclips, buttons, etc) - Slide checkers on tabletop - Turn doorknob - Open/close cabinet door with handle - Pick up coins and place in a container - Fold towels - Stack checkers - Pick up a pencil - Put empty clothes hangers on a rack, remove, and repeat - eat finger foods with right hand - fasten nuts/bolts

## 2015-02-12 NOTE — Therapy (Signed)
[facility] [address] [place], [place], [address] Phone: [phone]   Fax:  [phone]  Occupational Therapy Treatment  Patient Details  Name: [name] MRN: [id number] Date of Birth: [birth year] Referring Provider:  Leonides Sake, MD  Encounter Date: 02/12/2015      OT End of Session - 02/12/15 1119    Visit Number 3   Number of Visits 17   Date for OT Re-Evaluation 03/31/15   Authorization Type Medicare   Authorization - Visit Number 3   Authorization - Number of Visits 10   OT Start Time [id number]   OT Stop Time 1145   OT Time Calculation (min) 42 min   Activity Tolerance Patient tolerated treatment well   Behavior During Therapy [facility] for tasks assessed/performed      Past Medical History  Diagnosis Date  . Non-STEMI (non-ST elevated myocardial infarction) July 2012    1. Ostial LAD 70% (to close to [facility] for PCI), subtotal occlusion of the RCA  . CAD in native artery     a. NSTEMI 11/2010 - CABG x2(LIMA to LAD, SVG to PDA-11/2010). b. NEGATIVE Lexiscan Myoview 6/9/'15, EF 53%, no perfusion abnormality, septal and apical HK noted. C. NSTEMI 05/2014 - s/p DES to SVG-RPDA 06/18/14 (Xience Alpine DES 3.0 x 18 mm -3.35 mm), EF 60-65%.  . 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 Servando Snare)  . Postoperative atrial fibrillation     Post CABG, no sign recurrence  . HTN (hypertension)   . Type II diabetes mellitus with complication   . Hyperlipidemia LDL goal <70   . 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 Refusal of blood transfusions as patient is Jehovah's Witness   . Sleep apnea   . Abdominal aortic aneurysm     a. Aortic duplex 06/2014: mild aneurysmal dilatation of proximal abdominal aorta measuring 3.4x3.4cm. No sig change from 2012. F/u due 06/2016.  . Dizziness     a. Carotid duplex 03/2014: mild fibrous plaque, no significant stenosis.  . Stroke     a. 2014  with mild right hand weakness, nonhemorrhagic per pt.    Past Surgical History  Procedure Laterality Date  . Cardiac catheterization  12/11/2010    Dr. Chase Picket - subsequent cath - normal LV systolic function, no renal artery stenosis, severe 2-vessel disease with subtotaled RCA prox and distal 60% lesion and complex 70% area of narrowing of ostium of LAD  . Coronary artery bypass graft  12/15/2010    X2, Dr Servando Snare; LIMA to LAD, SVG to PDA;   Marland Kitchen Cystoscopy with urethral dilatation    . Transthoracic echocardiogram  07/28/2012    EF 55-60%, severe Conc LVH, Nl Systolic fxn, G1 DD,  Ao Sclerosis w/o stenosis; CRO PFO  . Left heart catheterization with coronary angiogram N/A 06/18/2014    Procedure: LEFT HEART CATHETERIZATION WITH CORONARY ANGIOGRAM;  Surgeon: Leonie Man, MD;  Location: [facility] CATH LAB;  Service: Cardiovascular;  Laterality: N/A;  . Radiology with anesthesia N/A 01/24/2015    Procedure: STENT ASSISTED ANGIOPLASTY (RADIOLOGY WITH ANESTHESIA);  Surgeon: Luanne Bras, MD;  Location: [place];  Service: Radiology;  Laterality: N/A;    There were no vitals filed for this visit.  Visit Diagnosis:  Weakness due to cerebrovascular accident  Decreased coordination  Impaired sensation      Subjective Assessment - 02/12/15 1108    Subjective  Pt reports that he's been doing HEP.  Pt reports mild soreness due to doing exercises more than recommended ?   Pertinent History see Epic   Patient Stated Goals use RUE   Currently in Pain? No/denies                      OT Treatments/Exercises (OP) - 02/12/15 0001    Fine Motor Coordination   Other Fine Motor Exercises Focused on functional use/grasp and release of objects.  Pt picked up a variety of small objects (buttons, coins, paperclips, button, key, rubber band, etc) and placed in container with mod difficulty and had to drag flat objects like coins to the edge of table.  Pt was able to pick up checkers and stack  with mod difficulty and then place in connect 4 slots without dragging to the edge of table.  Pt able to drink from cup with RUE without spills and use RUE to fold towel with min difficulty.  Coordination and grasp/release improved with repetition during the session.                OT Education - 02/12/15 1119    Education Details reviewed HEP and added to HEP to incr RUE functional use.  Cautioned pt to not over-do exercises, but incr RUE functional use instead.     Person(s) Educated Patient   Methods Explanation;Demonstration;Verbal cues   Comprehension Verbalized understanding;Returned demonstration          OT Short Term Goals - 01/31/15 1001    OT SHORT TERM GOAL #1   Title I with inital HEP.   Baseline 03/01/15   Time 4   Period Weeks   Status New   OT SHORT TERM GOAL #2   Title I with RUE positioning to minimize pain and risk for injury, including splint wear, care PRN and verbalize understand ing of sensory precautions.   Time 4   Period Weeks   Status New   OT SHORT TERM GOAL #3   Title Pt will demonstrate 120 shoulder flexion with RUE with pain less than or equal to 3/10 for improved functional reach.   Time 4   Period Weeks   Status New   OT SHORT TERM GOAL #4   Title Pt will demonstrate 50 % composite finger flexion with RUE in prep for functional grasp.   Time 4   Status New   OT SHORT TERM GOAL #5   Title Pt will report ability to use RUE as a stabilizer/ gross A at least 25 % of the time for ADLs/ IADLs.   Time 4   Period Weeks   Status New   Additional Short Term Goals   Additional Short Term Goals Yes   OT SHORT TERM GOAL #6   Title Pt will verbzalize understanding of compensatory strategies for short term memory deficits.   Time 4   Period Weeks   Status New           OT Long Term Goals - 01/31/15 1004    OT LONG TERM GOAL #1   Title Pt will demonstrate improved RUE functional use as eveidenced by performing 5 blocks on box/ blocks  test.   Time 8   Period Weeks   Status New   OT LONG TERM GOAL #2   Title Pt will demonstrate wrist extension WFLS for ADLs.   Time 8   Period Weeks   Status New   OT LONG TERM GOAL #  3   Title Pt will use RUE as a gross A / stabilizer at least 50 % of the time for ADLS/ IADLs with pain less than or equal  to 3/10.   Time 8   Period Weeks   Status New   OT LONG TERM GOAL #4   Title Pt will perform light home management/ cooking at a modified independent level demonstrating good safety awareness.   Time 8   Period Weeks   Status New               Plan - 02/12/15 1207    Clinical Impression Statement Pt is progressing towards goals with improving ROM and ability to grasp/release objects and improved RUE functional use.   Plan continue with RUE functional use, neuro re-ed, and coordination.   pt may not need resting hand splint due to improved RUE function/ROM   Consulted and Agree with Plan of Care Patient        Problem List Patient Active Problem List   Diagnosis Date Noted  . Stenosis of carotid artery   . Essential hypertension   . HLD (hyperlipidemia)   . CVA (cerebral infarction) 01/22/2015  . CAD S/P percutaneous coronary angioplasty 09/19/2014  . Coronary artery disease involving coronary bypass graft of native heart with other forms of angina pectoris 09/19/2014  . Shortness of breath 09/19/2014  . Polypharmacy 09/19/2014  . Abdominal aortic aneurysm   . Stroke   . Hyperlipidemia LDL goal <70   . Type II diabetes mellitus with complication   . Coronary artery disease involving native coronary artery   . Abnormal TSH 06/19/2014  . Obesity 06/19/2014  . Bilateral lower extremity edema 03/30/2014  . Dizziness 02/21/2014  . Bradycardia 08/31/2013  . Postoperative atrial fibrillation     Class: Temporary  . Diabetes mellitus type 2 in obese   . Moderate essential hypertension   . Apnea, sleep   . Balanitis xerotica obliterans   . History of: Non-STEMI  (non-ST elevated myocardial infarction) 11/16/2010    Class: History of  . S/P CABG x 2 11/16/2010    [facility] 02/12/2015, 2:26 PM  [facility] [address] [place], [place], [address] Phone: [phone]   Fax:  [place], OTR/L 02/12/2015 2:26 PM

## 2015-02-15 ENCOUNTER — Ambulatory Visit: Payer: Medicare Other | Admitting: Occupational Therapy

## 2015-02-15 DIAGNOSIS — I69319 Unspecified symptoms and signs involving cognitive functions following cerebral infarction: Secondary | ICD-10-CM

## 2015-02-15 DIAGNOSIS — R279 Unspecified lack of coordination: Secondary | ICD-10-CM | POA: Diagnosis not present

## 2015-02-15 DIAGNOSIS — R531 Weakness: Secondary | ICD-10-CM | POA: Diagnosis not present

## 2015-02-15 DIAGNOSIS — IMO0002 Reserved for concepts with insufficient information to code with codable children: Secondary | ICD-10-CM

## 2015-02-15 DIAGNOSIS — R201 Hypoesthesia of skin: Secondary | ICD-10-CM | POA: Diagnosis not present

## 2015-02-15 DIAGNOSIS — R278 Other lack of coordination: Secondary | ICD-10-CM

## 2015-02-15 DIAGNOSIS — I69898 Other sequelae of other cerebrovascular disease: Secondary | ICD-10-CM | POA: Diagnosis not present

## 2015-02-15 DIAGNOSIS — I6931 Cognitive deficits following cerebral infarction: Secondary | ICD-10-CM | POA: Diagnosis not present

## 2015-02-15 NOTE — Therapy (Signed)
[facility] [address] [place], [place], [address] Phone: [phone]   Fax:  [phone]  Occupational Therapy Treatment  Patient Details  Name: [name] MRN: [id number] Date of Birth: [birth year] Referring Provider:  Leonides Sake, MD  Encounter Date: 02/15/2015      OT End of Session - 02/15/15 1206    Visit Number 4   Number of Visits 17   Authorization Type Medicare   Authorization - Visit Number 4   Authorization - Number of Visits 10   OT Start Time 1151   OT Stop Time 1230   OT Time Calculation (min) 39 min   Activity Tolerance Patient tolerated treatment well   Behavior During Therapy [facility] for tasks assessed/performed      Past Medical History  Diagnosis Date  . Non-STEMI (non-ST elevated myocardial infarction) July 2012    1. Ostial LAD 70% (to close to [facility] for PCI), subtotal occlusion of the RCA  . CAD in native artery     a. NSTEMI 11/2010 - CABG x2(LIMA to LAD, SVG to PDA-11/2010). b. NEGATIVE Lexiscan Myoview 6/9/'15, EF 53%, no perfusion abnormality, septal and apical HK noted. C. NSTEMI 05/2014 - s/p DES to SVG-RPDA 06/18/14 (Xience Alpine DES 3.0 x 18 mm -3.35 mm), EF 60-65%.  . 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 Servando Snare)  . Postoperative atrial fibrillation     Post CABG, no sign recurrence  . HTN (hypertension)   . Type II diabetes mellitus with complication   . Hyperlipidemia LDL goal <70   . 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 Refusal of blood transfusions as patient is Jehovah's Witness   . Sleep apnea   . Abdominal aortic aneurysm     a. Aortic duplex 06/2014: mild aneurysmal dilatation of proximal abdominal aorta measuring 3.4x3.4cm. No sig change from 2012. F/u due 06/2016.  . Dizziness     a. Carotid duplex 03/2014: mild fibrous plaque, no significant stenosis.  . Stroke     a. 2014 with mild right hand weakness,  nonhemorrhagic per pt.    Past Surgical History  Procedure Laterality Date  . Cardiac catheterization  12/11/2010    Dr. Chase Picket - subsequent cath - normal LV systolic function, no renal artery stenosis, severe 2-vessel disease with subtotaled RCA prox and distal 60% lesion and complex 70% area of narrowing of ostium of LAD  . Coronary artery bypass graft  12/15/2010    X2, Dr Servando Snare; LIMA to LAD, SVG to PDA;   Marland Kitchen Cystoscopy with urethral dilatation    . Transthoracic echocardiogram  07/28/2012    EF 55-60%, severe Conc LVH, Nl Systolic fxn, G1 DD,  Ao Sclerosis w/o stenosis; CRO PFO  . Left heart catheterization with coronary angiogram N/A 06/18/2014    Procedure: LEFT HEART CATHETERIZATION WITH CORONARY ANGIOGRAM;  Surgeon: Leonie Man, MD;  Location: [facility] CATH LAB;  Service: Cardiovascular;  Laterality: N/A;  . Radiology with anesthesia N/A 01/24/2015    Procedure: STENT ASSISTED ANGIOPLASTY (RADIOLOGY WITH ANESTHESIA);  Surgeon: Luanne Bras, MD;  Location: [facility] [place];  Service: Radiology;  Laterality: N/A;    There were no vitals filed for this visit.  Visit Diagnosis:  Weakness due to cerebrovascular accident  Decreased coordination  Impaired sensation  Cognitive deficits following cerebral infarction      Subjective Assessment - 02/15/15 1200    Subjective  Pt reports performing the functional activities from HEP last visit   Pertinent History see Epic   Patient Stated Goals use RUE   Currently in Pain? Yes   Pain Score 3    Pain Location --  thumb   Pain Orientation Right   Pain Descriptors / Indicators Aching   Pain Type Acute pain   Pain Onset 1 to 4 weeks ago   Aggravating Factors  malpositioning   Pain Relieving Factors repositioning   Multiple Pain Sites No       Treatment: NMES to finger flexors, 50 pps, 250 pw, 10 secs cycle, intensity 20, x 10 mins for increased finger flexion and to decrease edema. No adverse reactions. Pt demonstrated increased  flexion after estim. Pt reports soreness in his thumb and reports overdoing exercises, therapist cautioned pt regarding over doing exercises. A/ROM wrist extension x 10, finger composite flexion and opposition as able, followed by instruction regarding cane exercises in standing(Pt handouts depict supine for shoulder flex/ chest press, , yet pt was instructed to perform standing) Arm bike x 5 mins level 1 with RUE wrapped for conditioning.                      OT Education - 02/15/15 1229    Education provided Yes   Education Details cane exercises, importance of not overdoing exercises!!   Person(s) Educated Patient   Methods Explanation;Demonstration;Verbal cues   Comprehension Verbalized understanding;Returned demonstration;Verbal cues required          OT Short Term Goals - 01/31/15 1001    OT SHORT TERM GOAL #1   Title I with inital HEP.   Baseline 03/01/15   Time 4   Period Weeks   Status New   OT SHORT TERM GOAL #2   Title I with RUE positioning to minimize pain and risk for injury, including splint wear, care PRN and verbalize understand ing of sensory precautions.   Time 4   Period Weeks   Status New   OT SHORT TERM GOAL #3   Title Pt will demonstrate 120 shoulder flexion with RUE with pain less than or equal to 3/10 for improved functional reach.   Time 4   Period Weeks   Status New   OT SHORT TERM GOAL #4   Title Pt will demonstrate 50 % composite finger flexion with RUE in prep for functional grasp.   Time 4   Status New   OT SHORT TERM GOAL #5   Title Pt will report ability to use RUE as a stabilizer/ gross A at least 25 % of the time for ADLs/ IADLs.   Time 4   Period Weeks   Status New   Additional Short Term Goals   Additional Short Term Goals Yes   OT SHORT TERM GOAL #6   Title Pt will verbzalize understanding of compensatory strategies for short term memory deficits.   Time 4   Period Weeks   Status New           OT Long Term  Goals - 01/31/15 1004    OT LONG TERM GOAL #1   Title Pt will demonstrate improved RUE functional use as eveidenced by performing 5 blocks on box/ blocks test.   Time 8   Period Weeks   Status New   OT LONG TERM GOAL #2   Title Pt will demonstrate wrist extension WFLS for ADLs.   Time 8   Period Weeks   Status New  OT LONG TERM GOAL #3   Title Pt will use RUE as a gross A / stabilizer at least 50 % of the time for ADLS/ IADLs with pain less than or equal  to 3/10.   Time 8   Period Weeks   Status New   OT LONG TERM GOAL #4   Title Pt will perform light home management/ cooking at a modified independent level demonstrating good safety awareness.   Time 8   Period Weeks   Status New               Plan - 02/15/15 1202    Clinical Impression Statement Pt is progressing towards goals for RUE functional use   Pt will benefit from skilled therapeutic intervention in order to improve on the following deficits (Retired) Abnormal gait;Decreased coordination;Decreased range of motion;Impaired sensation;Increased edema;Decreased safety awareness;Decreased endurance;Decreased activity tolerance;Pain;Impaired UE functional use;Decreased knowledge of use of DME;Decreased balance;Decreased cognition;Decreased mobility;Decreased strength   Rehab Potential Good   Clinical Impairments Affecting Rehab Potential sensory impairment   OT Frequency 2x / week   OT Duration 8 weeks   OT Treatment/Interventions Self-care/ADL training;Therapeutic exercise;Functional Mobility Training;Patient/family education;Splinting;Balance training;Manual Therapy;Neuromuscular education;Ultrasound;Therapeutic exercises;Therapeutic activities;DME and/or AE instruction;Parrafin;Cryotherapy;Electrical Stimulation;Fluidtherapy;Scar mobilization;Cognitive remediation/compensation;Passive range of motion;Contrast Bath;Moist Heat   Plan neuro re-ed, estim   Consulted and Agree with Plan of Care Patient        Problem  List Patient Active Problem List   Diagnosis Date Noted  . Stenosis of carotid artery   . Essential hypertension   . HLD (hyperlipidemia)   . CVA (cerebral infarction) 01/22/2015  . CAD S/P percutaneous coronary angioplasty 09/19/2014  . Coronary artery disease involving coronary bypass graft of native heart with other forms of angina pectoris 09/19/2014  . Shortness of breath 09/19/2014  . Polypharmacy 09/19/2014  . Abdominal aortic aneurysm   . Stroke   . Hyperlipidemia LDL goal <70   . Type II diabetes mellitus with complication   . Coronary artery disease involving native coronary artery   . Abnormal TSH 06/19/2014  . Obesity 06/19/2014  . Bilateral lower extremity edema 03/30/2014  . Dizziness 02/21/2014  . Bradycardia 08/31/2013  . Postoperative atrial fibrillation     Class: Temporary  . Diabetes mellitus type 2 in obese   . Moderate essential hypertension   . Apnea, sleep   . Balanitis xerotica obliterans   . History of: Non-STEMI (non-ST elevated myocardial infarction) 11/16/2010    Class: History of  . S/P CABG x 2 11/16/2010    RINE,KATHRYN 02/15/2015, 12:30 PM Theone Murdoch, OTR/L [phone] Phone: [phone] 12:40 PM 02/15/2015+ [facility] [address] [place], [place], [address] Phone: [phone]   Fax:  [phone]

## 2015-02-15 NOTE — Patient Instructions (Signed)
   Lie on back holding wand or stand to perform. Raise arms over head. Hold 5sec. Repeat 20 times per set.  Do 1 sessions per day.   ROM: Abduction - Wand   Holding wand with left hand palm up, push wand directly out to side, leading with other hand palm down, until stretch is felt. Hold 5 seconds. Repeat 20 times per set. Do 1sessions per day. (Lying down)   ROM: Extension - Wand (Standing)   Stand holding wand behind back. Raise arms as far as possible. Repeat 20 reps,  per day.   Press-Up With Wand   Press wand up until elbows are straight, then reach wand over head to a pain free range. Hold 5 seconds. Repeat 20 times. Do 1 sessions per day.

## 2015-02-19 ENCOUNTER — Ambulatory Visit: Payer: Medicare Other | Attending: Neurology | Admitting: Occupational Therapy

## 2015-02-19 DIAGNOSIS — R531 Weakness: Secondary | ICD-10-CM | POA: Insufficient documentation

## 2015-02-19 DIAGNOSIS — I69319 Unspecified symptoms and signs involving cognitive functions following cerebral infarction: Secondary | ICD-10-CM | POA: Insufficient documentation

## 2015-02-19 DIAGNOSIS — R279 Unspecified lack of coordination: Secondary | ICD-10-CM | POA: Insufficient documentation

## 2015-02-19 DIAGNOSIS — R278 Other lack of coordination: Secondary | ICD-10-CM

## 2015-02-19 DIAGNOSIS — I69898 Other sequelae of other cerebrovascular disease: Secondary | ICD-10-CM | POA: Insufficient documentation

## 2015-02-19 DIAGNOSIS — R201 Hypoesthesia of skin: Secondary | ICD-10-CM | POA: Diagnosis not present

## 2015-02-19 DIAGNOSIS — IMO0002 Reserved for concepts with insufficient information to code with codable children: Secondary | ICD-10-CM

## 2015-02-19 NOTE — Therapy (Signed)
[facility] [address] [place], [place], [address] Phone: [phone]   Fax:  [phone]  Occupational Therapy Treatment  Patient Details  Name: [name] MRN: [id number] Date of Birth: [birth year] Referring Provider:  Leonides Sake, MD  Encounter Date: 02/19/2015      OT End of Session - 02/19/15 1417    Visit Number 5   Number of Visits 17   Date for OT Re-Evaluation 03/31/15   Authorization Type Medicare   Authorization - Visit Number 5   Authorization - Number of Visits 10   OT Start Time [id number]   OT Stop Time 1445   OT Time Calculation (min) 41 min   Activity Tolerance Patient tolerated treatment well   Behavior During Therapy [facility] for tasks assessed/performed      Past Medical History  Diagnosis Date  . Non-STEMI (non-ST elevated myocardial infarction) July 2012    1. Ostial LAD 70% (to close to [facility] for PCI), subtotal occlusion of the RCA  . CAD in native artery     a. NSTEMI 11/2010 - CABG x2(LIMA to LAD, SVG to PDA-11/2010). b. NEGATIVE Lexiscan Myoview 6/9/'15, EF 53%, no perfusion abnormality, septal and apical HK noted. C. NSTEMI 05/2014 - s/p DES to SVG-RPDA 06/18/14 (Xience Alpine DES 3.0 x 18 mm -3.35 mm), EF 60-65%.  . 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 Servando Snare)  . Postoperative atrial fibrillation     Post CABG, no sign recurrence  . HTN (hypertension)   . Type II diabetes mellitus with complication   . Hyperlipidemia LDL goal <70   . 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 Refusal of blood transfusions as patient is Jehovah's Witness   . Sleep apnea   . Abdominal aortic aneurysm     a. Aortic duplex 06/2014: mild aneurysmal dilatation of proximal abdominal aorta measuring 3.4x3.4cm. No sig change from 2012. F/u due 06/2016.  . Dizziness     a. Carotid duplex 03/2014: mild fibrous plaque, no significant stenosis.  . Stroke     a. 2014  with mild right hand weakness, nonhemorrhagic per pt.    Past Surgical History  Procedure Laterality Date  . Cardiac catheterization  12/11/2010    Dr. Chase Picket - subsequent cath - normal LV systolic function, no renal artery stenosis, severe 2-vessel disease with subtotaled RCA prox and distal 60% lesion and complex 70% area of narrowing of ostium of LAD  . Coronary artery bypass graft  12/15/2010    X2, Dr Servando Snare; LIMA to LAD, SVG to PDA;   Marland Kitchen Cystoscopy with urethral dilatation    . Transthoracic echocardiogram  07/28/2012    EF 55-60%, severe Conc LVH, Nl Systolic fxn, G1 DD,  Ao Sclerosis w/o stenosis; CRO PFO  . Left heart catheterization with coronary angiogram N/A 06/18/2014    Procedure: LEFT HEART CATHETERIZATION WITH CORONARY ANGIOGRAM;  Surgeon: Leonie Man, MD;  Location: [facility] CATH LAB;  Service: Cardiovascular;  Laterality: N/A;  . Radiology with anesthesia N/A 01/24/2015    Procedure: STENT ASSISTED ANGIOPLASTY (RADIOLOGY WITH ANESTHESIA);  Surgeon: Luanne Bras, MD;  Location: [place];  Service: Radiology;  Laterality: N/A;    There were no vitals filed for this visit.  Visit Diagnosis:  Weakness due to cerebrovascular accident  Decreased coordination  Impaired sensation      Subjective Assessment - 02/19/15 1416    Pertinent  History see Epic   Patient Stated Goals use RUE   Currently in Pain? Yes   Pain Score 3    Pain Location --  thumb   Pain Orientation Right   Pain Descriptors / Indicators Aching   Pain Type Acute pain   Pain Onset 1 to 4 weeks ago   Aggravating Factors  overuse   Pain Relieving Factors repositioning   Multiple Pain Sites No       Treatment: NMES 50pps, 250 pw, 10 secs cycle x 8 mins to finger flexors, no adverse reactions, with therapist or pt facilitating finger flexion during on cycle. A/ Rom finger composite flexion, thumb IP flexion, composite flexion with yellow putty for increased grip, 10-20 reps each, min v.c. Flipping  playing cards with RUE for increased RUE functional use, min v.c./ Facilitation.  Arm bike x 5 mins , level 1 for reciprocal movement                          OT Short Term Goals - 01/31/15 1001    OT SHORT TERM GOAL #1   Title I with inital HEP.   Baseline 03/01/15   Time 4   Period Weeks   Status New   OT SHORT TERM GOAL #2   Title I with RUE positioning to minimize pain and risk for injury, including splint wear, care PRN and verbalize understand ing of sensory precautions.   Time 4   Period Weeks   Status New   OT SHORT TERM GOAL #3   Title Pt will demonstrate 120 shoulder flexion with RUE with pain less than or equal to 3/10 for improved functional reach.   Time 4   Period Weeks   Status New   OT SHORT TERM GOAL #4   Title Pt will demonstrate 50 % composite finger flexion with RUE in prep for functional grasp.   Time 4   Status New   OT SHORT TERM GOAL #5   Title Pt will report ability to use RUE as a stabilizer/ gross A at least 25 % of the time for ADLs/ IADLs.   Time 4   Period Weeks   Status New   Additional Short Term Goals   Additional Short Term Goals Yes   OT SHORT TERM GOAL #6   Title Pt will verbzalize understanding of compensatory strategies for short term memory deficits.   Time 4   Period Weeks   Status New           OT Long Term Goals - 01/31/15 1004    OT LONG TERM GOAL #1   Title Pt will demonstrate improved RUE functional use as eveidenced by performing 5 blocks on box/ blocks test.   Time 8   Period Weeks   Status New   OT LONG TERM GOAL #2   Title Pt will demonstrate wrist extension WFLS for ADLs.   Time 8   Period Weeks   Status New   OT LONG TERM GOAL #3   Title Pt will use RUE as a gross A / stabilizer at least 50 % of the time for ADLS/ IADLs with pain less than or equal  to 3/10.   Time 8   Period Weeks   Status New   OT LONG TERM GOAL #4   Title Pt will perform light home management/ cooking at a modified  independent level demonstrating good safety awareness.   Time 8   Period  Weeks   Status New               Problem List Patient Active Problem List   Diagnosis Date Noted  . Stenosis of carotid artery   . Essential hypertension   . HLD (hyperlipidemia)   . CVA (cerebral infarction) 01/22/2015  . CAD S/P percutaneous coronary angioplasty 09/19/2014  . Coronary artery disease involving coronary bypass graft of native heart with other forms of angina pectoris ([place]) 09/19/2014  . Shortness of breath 09/19/2014  . Polypharmacy 09/19/2014  . Abdominal aortic aneurysm ([place])   . Stroke ([place])   . Hyperlipidemia LDL goal <70   . Type II diabetes mellitus with complication ([place])   . Coronary artery disease involving native coronary artery   . Abnormal TSH 06/19/2014  . Obesity 06/19/2014  . Bilateral lower extremity edema 03/30/2014  . Dizziness 02/21/2014  . Bradycardia 08/31/2013  . Postoperative atrial fibrillation (HCC)     Class: Temporary  . Diabetes mellitus type 2 in obese ([place])   . Moderate essential hypertension   . Apnea, sleep   . Balanitis xerotica obliterans   . History of: Non-STEMI (non-ST elevated myocardial infarction) 11/16/2010    Class: History of  . S/P CABG x 2 11/16/2010    RINE,KATHRYN 02/19/2015, 4:54 PM Theone Murdoch, OTR/L [phone] Phone: [phone] 4:54 PM 02/19/2015 [place] [address] [place], [place], [address] Phone: [phone]   Fax:  [phone]

## 2015-02-22 ENCOUNTER — Ambulatory Visit: Payer: Medicare Other | Admitting: Occupational Therapy

## 2015-02-22 DIAGNOSIS — IMO0002 Reserved for concepts with insufficient information to code with codable children: Secondary | ICD-10-CM

## 2015-02-22 DIAGNOSIS — I69898 Other sequelae of other cerebrovascular disease: Secondary | ICD-10-CM | POA: Diagnosis not present

## 2015-02-22 DIAGNOSIS — R531 Weakness: Secondary | ICD-10-CM | POA: Diagnosis not present

## 2015-02-22 DIAGNOSIS — I69319 Unspecified symptoms and signs involving cognitive functions following cerebral infarction: Secondary | ICD-10-CM | POA: Diagnosis not present

## 2015-02-22 DIAGNOSIS — R201 Hypoesthesia of skin: Secondary | ICD-10-CM | POA: Diagnosis not present

## 2015-02-22 DIAGNOSIS — R279 Unspecified lack of coordination: Secondary | ICD-10-CM | POA: Diagnosis not present

## 2015-02-22 DIAGNOSIS — R278 Other lack of coordination: Secondary | ICD-10-CM

## 2015-02-22 NOTE — Patient Instructions (Signed)

## 2015-02-22 NOTE — Therapy (Signed)
[facility] [address] [place], [place], [address] Phone: [phone]   Fax:  [phone]  Occupational Therapy Treatment  Patient Details  Name: [name] MRN: [id number] Date of Birth: 1931/04/15 Referring Provider:  Leonides Sake, MD  Encounter Date: 02/22/2015      OT End of Session - 02/22/15 1232    Visit Number 6   Number of Visits 17   Date for OT Re-Evaluation 03/31/15   Authorization Type Medicare   Authorization - Visit Number 6   Authorization - Number of Visits 10   OT Start Time [id number]   OT Stop Time 1233   OT Time Calculation (min) 46 min      Past Medical History  Diagnosis Date  . Non-STEMI (non-ST elevated myocardial infarction) July 2012    1. Ostial LAD 70% (to close to [facility] for PCI), subtotal occlusion of the RCA  . CAD in native artery     a. NSTEMI 11/2010 - CABG x2(LIMA to LAD, SVG to PDA-11/2010). b. NEGATIVE Lexiscan Myoview 6/9/'15, EF 53%, no perfusion abnormality, septal and apical HK noted. C. NSTEMI 05/2014 - s/p DES to SVG-RPDA 06/18/14 (Xience Alpine DES 3.0 x 18 mm -3.35 mm), EF 60-65%.  . 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 Servando Snare)  . Postoperative atrial fibrillation     Post CABG, no sign recurrence  . HTN (hypertension)   . Type II diabetes mellitus with complication   . Hyperlipidemia LDL goal <70   . 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 Refusal of blood transfusions as patient is Jehovah's Witness   . Sleep apnea   . Abdominal aortic aneurysm     a. Aortic duplex 06/2014: mild aneurysmal dilatation of proximal abdominal aorta measuring 3.4x3.4cm. No sig change from 2012. F/u due 06/2016.  . Dizziness     a. Carotid duplex 03/2014: mild fibrous plaque, no significant stenosis.  . Stroke     a. 2014 with mild right hand weakness, nonhemorrhagic per pt.    Past Surgical History  Procedure Laterality Date  .  Cardiac catheterization  12/11/2010    Dr. Chase Picket - subsequent cath - normal LV systolic function, no renal artery stenosis, severe 2-vessel disease with subtotaled RCA prox and distal 60% lesion and complex 70% area of narrowing of ostium of LAD  . Coronary artery bypass graft  12/15/2010    X2, Dr Servando Snare; LIMA to LAD, SVG to PDA;   Marland Kitchen Cystoscopy with urethral dilatation    . Transthoracic echocardiogram  07/28/2012    EF 55-60%, severe Conc LVH, Nl Systolic fxn, G1 DD,  Ao Sclerosis w/o stenosis; CRO PFO  . Left heart catheterization with coronary angiogram N/A 06/18/2014    Procedure: LEFT HEART CATHETERIZATION WITH CORONARY ANGIOGRAM;  Surgeon: Leonie Man, MD;  Location: [facility] CATH LAB;  Service: Cardiovascular;  Laterality: N/A;  . Radiology with anesthesia N/A 01/24/2015    Procedure: STENT ASSISTED ANGIOPLASTY (RADIOLOGY WITH ANESTHESIA);  Surgeon: Luanne Bras, MD;  Location: [place];  Service: Radiology;  Laterality: N/A;    There were no vitals filed for this visit.  Visit Diagnosis:  Weakness due to cerebrovascular accident  Decreased coordination  Impaired sensation  Cognitive deficits following cerebral infarction      Subjective Assessment - 02/22/15 1155    Pertinent History see Epic   Patient Stated Goals use RUE  Currently in Pain? Yes   Pain Score 5    Pain Location Hand   Pain Orientation Right   Pain Descriptors / Indicators Aching   Pain Type Acute pain   Pain Onset 1 to 4 weeks ago   Pain Frequency Intermittent   Aggravating Factors  overuse   Pain Relieving Factors malpositioning   Multiple Pain Sites No        Treatment: Pt reports pain in hand from overdoing vacuuming Fluidotherapy x 9 mins for stiffness and pain, AA/ROM composite finger flexion, then ooposition. Grasp Judithann Sheen of 1 inch blocks to place in container, followed by overhead functional reaching to place large pegs in pegboard with min-mod difficulty, min v.c.. Functional overhead  reach with LUE to place items in cabinets with LUE. Pt practiced using a buttonhook following instruction and returned demonstration. Education regarding memory compensation strategies.                        OT Short Term Goals - 01/31/15 1001    OT SHORT TERM GOAL #1   Title I with inital HEP.   Baseline 03/01/15   Time 4   Period Weeks   Status New   OT SHORT TERM GOAL #2   Title I with RUE positioning to minimize pain and risk for injury, including splint wear, care PRN and verbalize understand ing of sensory precautions.   Time 4   Period Weeks   Status New   OT SHORT TERM GOAL #3   Title Pt will demonstrate 120 shoulder flexion with RUE with pain less than or equal to 3/10 for improved functional reach.   Time 4   Period Weeks   Status New   OT SHORT TERM GOAL #4   Title Pt will demonstrate 50 % composite finger flexion with RUE in prep for functional grasp.   Time 4   Status New   OT SHORT TERM GOAL #5   Title Pt will report ability to use RUE as a stabilizer/ gross A at least 25 % of the time for ADLs/ IADLs.   Time 4   Period Weeks   Status New   Additional Short Term Goals   Additional Short Term Goals Yes   OT SHORT TERM GOAL #6   Title Pt will verbzalize understanding of compensatory strategies for short term memory deficits.   Time 4   Period Weeks   Status New           OT Long Term Goals - 01/31/15 1004    OT LONG TERM GOAL #1   Title Pt will demonstrate improved RUE functional use as eveidenced by performing 5 blocks on box/ blocks test.   Time 8   Period Weeks   Status New   OT LONG TERM GOAL #2   Title Pt will demonstrate wrist extension WFLS for ADLs.   Time 8   Period Weeks   Status New   OT LONG TERM GOAL #3   Title Pt will use RUE as a gross A / stabilizer at least 50 % of the time for ADLS/ IADLs with pain less than or equal  to 3/10.   Time 8   Period Weeks   Status New   OT LONG TERM GOAL #4   Title Pt will perform  light home management/ cooking at a modified independent level demonstrating good safety awareness.   Time 8   Period Weeks   Status New  Plan - 02/22/15 1157    Clinical Impression Statement Pt is progressing towards goals for strength and functional use   Pt will benefit from skilled therapeutic intervention in order to improve on the following deficits (Retired) Abnormal gait;Decreased coordination;Decreased range of motion;Impaired sensation;Increased edema;Decreased safety awareness;Decreased endurance;Decreased activity tolerance;Pain;Impaired UE functional use;Decreased knowledge of use of DME;Decreased balance;Decreased cognition;Decreased mobility;Decreased strength   Rehab Potential Good   Clinical Impairments Affecting Rehab Potential sensory impairment   OT Frequency 2x / week   OT Duration 8 weeks   OT Treatment/Interventions Self-care/ADL training;Therapeutic exercise;Functional Mobility Training;Patient/family education;Splinting;Balance training;Manual Therapy;Neuromuscular education;Ultrasound;Therapeutic exercises;Therapeutic activities;DME and/or AE instruction;Parrafin;Cryotherapy;Electrical Stimulation;Fluidtherapy;Scar mobilization;Cognitive remediation/compensation;Passive range of motion;Contrast Bath;Moist Heat   Plan neuro re-ed RUE functional use   OT Home Exercise Plan issued inital HEP 9/22   Consulted and Agree with Plan of Care Patient        Problem List Patient Active Problem List   Diagnosis Date Noted  . Stenosis of carotid artery   . Essential hypertension   . HLD (hyperlipidemia)   . CVA (cerebral infarction) 01/22/2015  . CAD S/P percutaneous coronary angioplasty 09/19/2014  . Coronary artery disease involving coronary bypass graft of native heart with other forms of angina pectoris ([place]) 09/19/2014  . Shortness of breath 09/19/2014  . Polypharmacy 09/19/2014  . Abdominal aortic aneurysm ([place])   . Stroke ([place])   .  Hyperlipidemia LDL goal <70   . Type II diabetes mellitus with complication ([place])   . Coronary artery disease involving native coronary artery   . Abnormal TSH 06/19/2014  . Obesity 06/19/2014  . Bilateral lower extremity edema 03/30/2014  . Dizziness 02/21/2014  . Bradycardia 08/31/2013  . Postoperative atrial fibrillation (HCC)     Class: Temporary  . Diabetes mellitus type 2 in obese ([place])   . Moderate essential hypertension   . Apnea, sleep   . Balanitis xerotica obliterans   . History of: Non-STEMI (non-ST elevated myocardial infarction) 11/16/2010    Class: History of  . S/P CABG x 2 11/16/2010    Vineta Carone 02/22/2015, 12:33 PM Theone Murdoch, OTR/L [phone] Phone: [phone] 12:33 PM 02/22/2015 [facility] [address] [place], [place], [address] Phone: [phone]   Fax:  [phone]

## 2015-02-26 ENCOUNTER — Ambulatory Visit: Payer: Medicare Other | Admitting: Occupational Therapy

## 2015-02-26 DIAGNOSIS — R279 Unspecified lack of coordination: Secondary | ICD-10-CM | POA: Diagnosis not present

## 2015-02-26 DIAGNOSIS — R278 Other lack of coordination: Secondary | ICD-10-CM

## 2015-02-26 DIAGNOSIS — R201 Hypoesthesia of skin: Secondary | ICD-10-CM | POA: Diagnosis not present

## 2015-02-26 DIAGNOSIS — I69319 Unspecified symptoms and signs involving cognitive functions following cerebral infarction: Secondary | ICD-10-CM | POA: Diagnosis not present

## 2015-02-26 DIAGNOSIS — I69898 Other sequelae of other cerebrovascular disease: Secondary | ICD-10-CM | POA: Diagnosis not present

## 2015-02-26 DIAGNOSIS — R531 Weakness: Secondary | ICD-10-CM | POA: Diagnosis not present

## 2015-02-26 DIAGNOSIS — IMO0002 Reserved for concepts with insufficient information to code with codable children: Secondary | ICD-10-CM

## 2015-02-26 NOTE — Patient Instructions (Signed)
Wear thumb spica splint during activities that you need thumb support for grip and pinch, remove if you have pain, pressure areas or redness and stop wearing.  Remove brace several times a day so you are not dependent on the brace.

## 2015-02-26 NOTE — Therapy (Signed)
[facility] [address] [place], [place], [address] Phone: [phone]   Fax:  [phone]  Occupational Therapy Treatment  Patient Details  Name: [name] MRN: [id number] Date of Birth: 09/26/1930 Referring Provider:  Leonides Sake, MD  Encounter Date: 02/26/2015      OT End of Session - 02/26/15 1602    Visit Number 7   Number of Visits 17   Date for OT Re-Evaluation 03/31/15   Authorization Type Medicare   Authorization - Visit Number 7   Authorization - Number of Visits 10   OT Start Time [id number]   OT Stop Time 1400   OT Time Calculation (min) 43 min   Activity Tolerance Patient tolerated treatment well   Behavior During Therapy [facility] for tasks assessed/performed      Past Medical History  Diagnosis Date  . Non-STEMI (non-ST elevated myocardial infarction) July 2012    1. Ostial LAD 70% (to close to [facility] for PCI), subtotal occlusion of the RCA  . CAD in native artery     a. NSTEMI 11/2010 - CABG x2(LIMA to LAD, SVG to PDA-11/2010). b. NEGATIVE Lexiscan Myoview 6/9/'15, EF 53%, no perfusion abnormality, septal and apical HK noted. C. NSTEMI 05/2014 - s/p DES to SVG-RPDA 06/18/14 (Xience Alpine DES 3.0 x 18 mm -3.35 mm), EF 60-65%.  . 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 Servando Snare)  . Postoperative atrial fibrillation     Post CABG, no sign recurrence  . HTN (hypertension)   . Type II diabetes mellitus with complication   . Hyperlipidemia LDL goal <70   . 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 Refusal of blood transfusions as patient is Jehovah's Witness   . Sleep apnea   . Abdominal aortic aneurysm     a. Aortic duplex 06/2014: mild aneurysmal dilatation of proximal abdominal aorta measuring 3.4x3.4cm. No sig change from 2012. F/u due 06/2016.  . Dizziness     a. Carotid duplex 03/2014: mild fibrous plaque, no significant stenosis.  . Stroke     a.  2014 with mild right hand weakness, nonhemorrhagic per pt.    Past Surgical History  Procedure Laterality Date  . Cardiac catheterization  12/11/2010    Dr. Chase Picket - subsequent cath - normal LV systolic function, no renal artery stenosis, severe 2-vessel disease with subtotaled RCA prox and distal 60% lesion and complex 70% area of narrowing of ostium of LAD  . Coronary artery bypass graft  12/15/2010    X2, Dr Servando Snare; LIMA to LAD, SVG to PDA;   Marland Kitchen Cystoscopy with urethral dilatation    . Transthoracic echocardiogram  07/28/2012    EF 55-60%, severe Conc LVH, Nl Systolic fxn, G1 DD,  Ao Sclerosis w/o stenosis; CRO PFO  . Left heart catheterization with coronary angiogram N/A 06/18/2014    Procedure: LEFT HEART CATHETERIZATION WITH CORONARY ANGIOGRAM;  Surgeon: Leonie Man, MD;  Location: [facility] CATH LAB;  Service: Cardiovascular;  Laterality: N/A;  . Radiology with anesthesia N/A 01/24/2015    Procedure: STENT ASSISTED ANGIOPLASTY (RADIOLOGY WITH ANESTHESIA);  Surgeon: Luanne Bras, MD;  Location: [facility];  Service: Radiology;  Laterality: N/A;    There were no vitals filed for this visit.  Visit Diagnosis:  Weakness due to cerebrovascular accident  Decreased coordination  Impaired sensation      Subjective Assessment - 02/26/15 1328    Subjective  Pt reports cutting finger with a knife    Pertinent History see Epic   Patient Stated Goals use RUE   Currently in Pain? No/denies      Treatment: NMES 50 pps , 250 pw, 10 secs cycle intensity 18 x 8 mins to finger flexors with pt/ therapist facilitating flexion during on cycle.  Pt demonstrated improved finger flexion afterwards. A/ROM finger flexion/ extension, wrist flexion extension.  Pt reported thumb pain and therapist tried several different braces. Pt reported increased comfort with pre fab thumb spica splint. Therapist reinforced sensory precautions and risk for injuring RUE due to sensory impairment.  Functional grasp  / release of dowel pegs with RUE while wearing brace, min facilitation provided.                        OT Education - 02/26/15 1610    Education provided Yes   Education Details thumb spica splint wear, care and precuations, sensory precautions(not to use power tools and to use extreme caution in the kitchen due to risk for injury)   Person(s) Educated Patient   Methods Explanation;Verbal cues;Handout   Comprehension Verbalized understanding          OT Short Term Goals - 01/31/15 1001    OT SHORT TERM GOAL #1   Title I with inital HEP.   Baseline 03/01/15   Time 4   Period Weeks   Status New   OT SHORT TERM GOAL #2   Title I with RUE positioning to minimize pain and risk for injury, including splint wear, care PRN and verbalize understand ing of sensory precautions.   Time 4   Period Weeks   Status New   OT SHORT TERM GOAL #3   Title Pt will demonstrate 120 shoulder flexion with RUE with pain less than or equal to 3/10 for improved functional reach.   Time 4   Period Weeks   Status New   OT SHORT TERM GOAL #4   Title Pt will demonstrate 50 % composite finger flexion with RUE in prep for functional grasp.   Time 4   Status New   OT SHORT TERM GOAL #5   Title Pt will report ability to use RUE as a stabilizer/ gross A at least 25 % of the time for ADLs/ IADLs.   Time 4   Period Weeks   Status New   Additional Short Term Goals   Additional Short Term Goals Yes   OT SHORT TERM GOAL #6   Title Pt will verbzalize understanding of compensatory strategies for short term memory deficits.   Time 4   Period Weeks   Status New           OT Long Term Goals - 01/31/15 1004    OT LONG TERM GOAL #1   Title Pt will demonstrate improved RUE functional use as eveidenced by performing 5 blocks on box/ blocks test.   Time 8   Period Weeks   Status New   OT LONG TERM GOAL #2   Title Pt will demonstrate wrist extension WFLS for ADLs.   Time 8   Period Weeks    Status New   OT LONG TERM GOAL #3   Title Pt will use RUE as a gross A / stabilizer at least 50 % of the time for ADLS/ IADLs with pain less than or equal  to 3/10.   Time 8   Period Weeks   Status New  OT LONG TERM GOAL #4   Title Pt will perform light home management/ cooking at a modified independent level demonstrating good safety awareness.   Time 8   Period Weeks   Status New               Plan - 02/26/15 1331    Clinical Impression Statement Pt is progressing towards goals. He is limited by thumb pain and stiffness today.   Pt will benefit from skilled therapeutic intervention in order to improve on the following deficits (Retired) Abnormal gait;Decreased coordination;Decreased range of motion;Impaired sensation;Increased edema;Decreased safety awareness;Decreased endurance;Decreased activity tolerance;Pain;Impaired UE functional use;Decreased knowledge of use of DME;Decreased balance;Decreased cognition;Decreased mobility;Decreased strength   Rehab Potential Good   OT Frequency 2x / week   OT Duration 8 weeks   OT Treatment/Interventions Self-care/ADL training;Therapeutic exercise;Functional Mobility Training;Patient/family education;Splinting;Balance training;Manual Therapy;Neuromuscular education;Ultrasound;Therapeutic exercises;Therapeutic activities;DME and/or AE instruction;Parrafin;Cryotherapy;Electrical Stimulation;Fluidtherapy;Scar mobilization;Cognitive remediation/compensation;Passive range of motion;Contrast Bath;Moist Heat   Plan check short term goals   OT Home Exercise Plan issued inital HEP 9/22   Consulted and Agree with Plan of Care Patient        Problem List Patient Active Problem List   Diagnosis Date Noted  . Stenosis of carotid artery   . Essential hypertension   . HLD (hyperlipidemia)   . CVA (cerebral infarction) 01/22/2015  . CAD S/P percutaneous coronary angioplasty 09/19/2014  . Coronary artery disease involving coronary bypass graft  of native heart with other forms of angina pectoris ([place]) 09/19/2014  . Shortness of breath 09/19/2014  . Polypharmacy 09/19/2014  . Abdominal aortic aneurysm ([place])   . Stroke ([place])   . Hyperlipidemia LDL goal <70   . Type II diabetes mellitus with complication ([place])   . Coronary artery disease involving native coronary artery   . Abnormal TSH 06/19/2014  . Obesity 06/19/2014  . Bilateral lower extremity edema 03/30/2014  . Dizziness 02/21/2014  . Bradycardia 08/31/2013  . Postoperative atrial fibrillation (HCC)     Class: Temporary  . Diabetes mellitus type 2 in obese ([place])   . Moderate essential hypertension   . Apnea, sleep   . Balanitis xerotica obliterans   . History of: Non-STEMI (non-ST elevated myocardial infarction) 11/16/2010    Class: History of  . S/P CABG x 2 11/16/2010    RINE,KATHRYN 02/26/2015, 4:12 PM Theone Murdoch, OTR/L [phone] Phone: [phone] 4:12 PM 02/26/2015 [facility] [address] [place], [place], [address] Phone: [phone]   Fax:  [phone]

## 2015-02-28 DIAGNOSIS — E7800 Pure hypercholesterolemia, unspecified: Secondary | ICD-10-CM | POA: Diagnosis not present

## 2015-02-28 DIAGNOSIS — E559 Vitamin D deficiency, unspecified: Secondary | ICD-10-CM | POA: Diagnosis not present

## 2015-02-28 DIAGNOSIS — E1129 Type 2 diabetes mellitus with other diabetic kidney complication: Secondary | ICD-10-CM | POA: Diagnosis not present

## 2015-02-28 DIAGNOSIS — D649 Anemia, unspecified: Secondary | ICD-10-CM | POA: Diagnosis not present

## 2015-03-01 ENCOUNTER — Ambulatory Visit: Payer: Medicare Other | Admitting: Occupational Therapy

## 2015-03-01 DIAGNOSIS — I69319 Unspecified symptoms and signs involving cognitive functions following cerebral infarction: Secondary | ICD-10-CM | POA: Diagnosis not present

## 2015-03-01 DIAGNOSIS — I69898 Other sequelae of other cerebrovascular disease: Secondary | ICD-10-CM | POA: Diagnosis not present

## 2015-03-01 DIAGNOSIS — R279 Unspecified lack of coordination: Secondary | ICD-10-CM | POA: Diagnosis not present

## 2015-03-01 DIAGNOSIS — R278 Other lack of coordination: Secondary | ICD-10-CM

## 2015-03-01 DIAGNOSIS — R531 Weakness: Secondary | ICD-10-CM | POA: Diagnosis not present

## 2015-03-01 DIAGNOSIS — IMO0002 Reserved for concepts with insufficient information to code with codable children: Secondary | ICD-10-CM

## 2015-03-01 DIAGNOSIS — R201 Hypoesthesia of skin: Secondary | ICD-10-CM | POA: Diagnosis not present

## 2015-03-01 NOTE — Therapy (Signed)
[facility] [address] [place], [place], [address] Phone: [phone]   Fax:  [phone]  Occupational Therapy Treatment  Patient Details  Name: [name] MRN: [id number] Date of Birth: 1931-01-06 No Data Recorded  Encounter Date: 03/01/2015      OT End of Session - 03/01/15 1005    Visit Number 8   Number of Visits 17   Date for OT Re-Evaluation 03/31/15   Authorization - Visit Number 8  G-code next week   Authorization - Number of Visits 10   OT Start Time 0935   OT Stop Time 1015   OT Time Calculation (min) 40 min   Activity Tolerance Patient tolerated treatment well   Behavior During Therapy [facility] for tasks assessed/performed      Past Medical History  Diagnosis Date  . Non-STEMI (non-ST elevated myocardial infarction) July 2012    1. Ostial LAD 70% (to close to [facility] for PCI), subtotal occlusion of the RCA  . CAD in native artery     a. NSTEMI 11/2010 - CABG x2(LIMA to LAD, SVG to PDA-11/2010). b. NEGATIVE Lexiscan Myoview 6/9/'15, EF 53%, no perfusion abnormality, septal and apical HK noted. C. NSTEMI 05/2014 - s/p DES to SVG-RPDA 06/18/14 (Xience Alpine DES 3.0 x 18 mm -3.35 mm), EF 60-65%.  . 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 Servando Snare)  . Postoperative atrial fibrillation     Post CABG, no sign recurrence  . HTN (hypertension)   . Type II diabetes mellitus with complication   . Hyperlipidemia LDL goal <70   . 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 Refusal of blood transfusions as patient is Jehovah's Witness   . Sleep apnea   . Abdominal aortic aneurysm     a. Aortic duplex 06/2014: mild aneurysmal dilatation of proximal abdominal aorta measuring 3.4x3.4cm. No sig change from 2012. F/u due 06/2016.  . Dizziness     a. Carotid duplex 03/2014: mild fibrous plaque, no significant stenosis.  . Stroke     a. 2014 with mild right hand weakness,  nonhemorrhagic per pt.    Past Surgical History  Procedure Laterality Date  . Cardiac catheterization  12/11/2010    Dr. Chase Picket - subsequent cath - normal LV systolic function, no renal artery stenosis, severe 2-vessel disease with subtotaled RCA prox and distal 60% lesion and complex 70% area of narrowing of ostium of LAD  . Coronary artery bypass graft  12/15/2010    X2, Dr Servando Snare; LIMA to LAD, SVG to PDA;   Marland Kitchen Cystoscopy with urethral dilatation    . Transthoracic echocardiogram  07/28/2012    EF 55-60%, severe Conc LVH, Nl Systolic fxn, G1 DD,  Ao Sclerosis w/o stenosis; CRO PFO  . Left heart catheterization with coronary angiogram N/A 06/18/2014    Procedure: LEFT HEART CATHETERIZATION WITH CORONARY ANGIOGRAM;  Surgeon: Leonie Man, MD;  Location: [facility] CATH LAB;  Service: Cardiovascular;  Laterality: N/A;  . Radiology with anesthesia N/A 01/24/2015    Procedure: STENT ASSISTED ANGIOPLASTY (RADIOLOGY WITH ANESTHESIA);  Surgeon: Luanne Bras, MD;  Location: [facility];  Service: Radiology;  Laterality: N/A;    There were no vitals filed for this visit.  Visit Diagnosis:  Weakness due to cerebrovascular accident  Decreased coordination  Impaired sensation   Treatement: Pt arrived wearing thumb spica splint. Pt reports thumb pain. Hot pack applied x9 mins to  right hand and wrist for pain relief, no adverse reactions.  AA/ROM finger flexion extension, thumb IP flexion blocking, exercise, NMES 50 pps , 250 pw, 10 secs cycle, intensity 18, x 8 mins to finger flexors, no adverse reactions.  Functional grasp/ release of dowel pegs with RUE min facilitation/ v.c, min- mod difficulty                           OT Short Term Goals - 03/01/15 1610    OT SHORT TERM GOAL #1   Title I with inital HEP.   Status Achieved   OT SHORT TERM GOAL #2   Title I with RUE positioning to minimize pain and risk for injury, including splint wear, care PRN and verbalize understand  ing of sensory precautions.   Status Achieved   OT SHORT TERM GOAL #3   Title Pt will demonstrate 120 shoulder flexion with RUE with pain less than or equal to 3/10 for improved functional reach.   Status Achieved   OT SHORT TERM GOAL #4   Title Pt will demonstrate 50 % composite finger flexion with RUE in prep for functional grasp.   Status Achieved   OT SHORT TERM GOAL #5   Title Pt will report ability to use RUE as a stabilizer/ gross A at least 25 % of the time for ADLs/ IADLs.   Status Achieved   OT SHORT TERM GOAL #6   Title Pt will verbzalize understanding of compensatory strategies for short term memory deficits.   Status Achieved           OT Long Term Goals - 01/31/15 1004    OT LONG TERM GOAL #1   Title Pt will demonstrate improved RUE functional use as eveidenced by performing 5 blocks on box/ blocks test.   Time 8   Period Weeks   Status New   OT LONG TERM GOAL #2   Title Pt will demonstrate wrist extension WFLS for ADLs.   Time 8   Period Weeks   Status New   OT LONG TERM GOAL #3   Title Pt will use RUE as a gross A / stabilizer at least 50 % of the time for ADLS/ IADLs with pain less than or equal  to 3/10.   Time 8   Period Weeks   Status New   OT LONG TERM GOAL #4   Title Pt will perform light home management/ cooking at a modified independent level demonstrating good safety awareness.   Time 8   Period Weeks   Status New               Plan - 03/01/15 1004    Clinical Impression Statement Pt met all short term goals and demonstrates good progress overall.   Pt will benefit from skilled therapeutic intervention in order to improve on the following deficits (Retired) Abnormal gait;Decreased coordination;Decreased range of motion;Impaired sensation;Increased edema;Decreased safety awareness;Decreased endurance;Decreased activity tolerance;Pain;Impaired UE functional use;Decreased knowledge of use of DME;Decreased balance;Decreased cognition;Decreased  mobility;Decreased strength   Rehab Potential Good   Clinical Impairments Affecting Rehab Potential sensory impairment   OT Frequency 2x / week   OT Duration 8 weeks   OT Treatment/Interventions Self-care/ADL training;Therapeutic exercise;Functional Mobility Training;Patient/family education;Splinting;Balance training;Manual Therapy;Neuromuscular education;Ultrasound;Therapeutic exercises;Therapeutic activities;DME and/or AE instruction;Parrafin;Cryotherapy;Electrical Stimulation;Fluidtherapy;Scar mobilization;Cognitive remediation/compensation;Passive range of motion;Contrast Bath;Moist Heat   Plan neuro re-ed , RUE functional use   OT Home Exercise Plan issued inital HEP 9/22   Consulted  and Agree with Plan of Care Patient        Problem List Patient Active Problem List   Diagnosis Date Noted  . Stenosis of carotid artery   . Essential hypertension   . HLD (hyperlipidemia)   . CVA (cerebral infarction) 01/22/2015  . CAD S/P percutaneous coronary angioplasty 09/19/2014  . Coronary artery disease involving coronary bypass graft of native heart with other forms of angina pectoris ([place]) 09/19/2014  . Shortness of breath 09/19/2014  . Polypharmacy 09/19/2014  . Abdominal aortic aneurysm ([place])   . Stroke ([place])   . Hyperlipidemia LDL goal <70   . Type II diabetes mellitus with complication ([place])   . Coronary artery disease involving native coronary artery   . Abnormal TSH 06/19/2014  . Obesity 06/19/2014  . Bilateral lower extremity edema 03/30/2014  . Dizziness 02/21/2014  . Bradycardia 08/31/2013  . Postoperative atrial fibrillation (HCC)     Class: Temporary  . Diabetes mellitus type 2 in obese ([place])   . Moderate essential hypertension   . Apnea, sleep   . Balanitis xerotica obliterans   . History of: Non-STEMI (non-ST elevated myocardial infarction) 11/16/2010    Class: History of  . S/P CABG x 2 11/16/2010    Quetzaly Ebner 03/01/2015, 10:06 AM Theone Murdoch,  OTR/L [phone] Phone: [phone] 10:06 AM 10/14/2016[facility] [address] [place], [place], [address] Phone: [phone]   Fax:  [phone]  Name: [name] MRN: [id number] Date of Birth: 1930-10-18

## 2015-03-05 ENCOUNTER — Ambulatory Visit: Payer: Medicare Other | Admitting: Occupational Therapy

## 2015-03-05 DIAGNOSIS — R278 Other lack of coordination: Secondary | ICD-10-CM

## 2015-03-05 DIAGNOSIS — I69319 Unspecified symptoms and signs involving cognitive functions following cerebral infarction: Secondary | ICD-10-CM

## 2015-03-05 DIAGNOSIS — R279 Unspecified lack of coordination: Secondary | ICD-10-CM | POA: Diagnosis not present

## 2015-03-05 DIAGNOSIS — I69898 Other sequelae of other cerebrovascular disease: Secondary | ICD-10-CM | POA: Diagnosis not present

## 2015-03-05 DIAGNOSIS — R201 Hypoesthesia of skin: Secondary | ICD-10-CM

## 2015-03-05 DIAGNOSIS — IMO0002 Reserved for concepts with insufficient information to code with codable children: Secondary | ICD-10-CM

## 2015-03-05 DIAGNOSIS — R531 Weakness: Secondary | ICD-10-CM | POA: Diagnosis not present

## 2015-03-05 NOTE — Therapy (Signed)
[facility] [address] [place], [place], [address] Phone: [phone]   Fax:  [phone]  Occupational Therapy Treatment  Patient Details  Name: [name] MRN: [id number] Date of Birth: Oct 01, 1930 No Data Recorded  Encounter Date: 03/05/2015      OT End of Session - 03/05/15 1410    Visit Number 9   Number of Visits 17   Date for OT Re-Evaluation 03/31/15   Authorization Type Medicare   Authorization - Visit Number 9   Authorization - Number of Visits 10   OT Start Time [id number]   OT Stop Time 1445   OT Time Calculation (min) 41 min   Activity Tolerance Patient tolerated treatment well   Behavior During Therapy [facility] for tasks assessed/performed      Past Medical History  Diagnosis Date  . Non-STEMI (non-ST elevated myocardial infarction) July 2012    1. Ostial LAD 70% (to close to [facility] for PCI), subtotal occlusion of the RCA  . CAD in native artery     a. NSTEMI 11/2010 - CABG x2(LIMA to LAD, SVG to PDA-11/2010). b. NEGATIVE Lexiscan Myoview 6/9/'15, EF 53%, no perfusion abnormality, septal and apical HK noted. C. NSTEMI 05/2014 - s/p DES to SVG-RPDA 06/18/14 (Xience Alpine DES 3.0 x 18 mm -3.35 mm), EF 60-65%.  . 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 Servando Snare)  . Postoperative atrial fibrillation     Post CABG, no sign recurrence  . HTN (hypertension)   . Type II diabetes mellitus with complication   . Hyperlipidemia LDL goal <70   . 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 Refusal of blood transfusions as patient is Jehovah's Witness   . Sleep apnea   . Abdominal aortic aneurysm     a. Aortic duplex 06/2014: mild aneurysmal dilatation of proximal abdominal aorta measuring 3.4x3.4cm. No sig change from 2012. F/u due 06/2016.  . Dizziness     a. Carotid duplex 03/2014: mild fibrous plaque, no significant stenosis.  . Stroke     a. 2014 with mild right hand  weakness, nonhemorrhagic per pt.    Past Surgical History  Procedure Laterality Date  . Cardiac catheterization  12/11/2010    Dr. Chase Picket - subsequent cath - normal LV systolic function, no renal artery stenosis, severe 2-vessel disease with subtotaled RCA prox and distal 60% lesion and complex 70% area of narrowing of ostium of LAD  . Coronary artery bypass graft  12/15/2010    X2, Dr Servando Snare; LIMA to LAD, SVG to PDA;   Marland Kitchen Cystoscopy with urethral dilatation    . Transthoracic echocardiogram  07/28/2012    EF 55-60%, severe Conc LVH, Nl Systolic fxn, G1 DD,  Ao Sclerosis w/o stenosis; CRO PFO  . Left heart catheterization with coronary angiogram N/A 06/18/2014    Procedure: LEFT HEART CATHETERIZATION WITH CORONARY ANGIOGRAM;  Surgeon: Leonie Man, MD;  Location: [facility] CATH LAB;  Service: Cardiovascular;  Laterality: N/A;  . Radiology with anesthesia N/A 01/24/2015    Procedure: STENT ASSISTED ANGIOPLASTY (RADIOLOGY WITH ANESTHESIA);  Surgeon: Luanne Bras, MD;  Location: [facility];  Service: Radiology;  Laterality: N/A;    There were no vitals filed for this visit.  Visit Diagnosis:  Weakness due to cerebrovascular accident  Decreased coordination  Impaired sensation  Cognitive deficits following cerebral infarction   treatment: NMES x 8 mins 50 pps, 250 pw, 10  secs, cycle , int 16 with therapist or pt facilitating finger flexion. Functional grasp/ release of large pegs to place and remove from pegboard, min facilitation, v.c.,  Isolated IP thumb flexion followed by overhead reaching to place/ remove cylindrical objects from cabinets with RUE, min v.c. To initiate IP thumb flexion.  Arm bike with RUE wrapped 5 mins for conditioning.                           OT Short Term Goals - 03/01/15 [id number]    OT SHORT TERM GOAL #1   Title I with inital HEP.   Status Achieved   OT SHORT TERM GOAL #2   Title I with RUE positioning to minimize pain and risk for injury,  including splint wear, care PRN and verbalize understand ing of sensory precautions.   Status Achieved   OT SHORT TERM GOAL #3   Title Pt will demonstrate 120 shoulder flexion with RUE with pain less than or equal to 3/10 for improved functional reach.   Status Achieved   OT SHORT TERM GOAL #4   Title Pt will demonstrate 50 % composite finger flexion with RUE in prep for functional grasp.   Status Achieved   OT SHORT TERM GOAL #5   Title Pt will report ability to use RUE as a stabilizer/ gross A at least 25 % of the time for ADLs/ IADLs.   Status Achieved   OT SHORT TERM GOAL #6   Title Pt will verbzalize understanding of compensatory strategies for short term memory deficits.   Status Achieved           OT Long Term Goals - 01/31/15 1004    OT LONG TERM GOAL #1   Title Pt will demonstrate improved RUE functional use as eveidenced by performing 5 blocks on box/ blocks test.   Time 8   Period Weeks   Status New   OT LONG TERM GOAL #2   Title Pt will demonstrate wrist extension WFLS for ADLs.   Time 8   Period Weeks   Status New   OT LONG TERM GOAL #3   Title Pt will use RUE as a gross A / stabilizer at least 50 % of the time for ADLS/ IADLs with pain less than or equal  to 3/10.   Time 8   Period Weeks   Status New   OT LONG TERM GOAL #4   Title Pt will perform light home management/ cooking at a modified independent level demonstrating good safety awareness.   Time 8   Period Weeks   Status New               Plan - 03/05/15 1413    Clinical Impression Statement Pt is progressing towards goals.   Pt will benefit from skilled therapeutic intervention in order to improve on the following deficits (Retired) Abnormal gait;Decreased coordination;Decreased range of motion;Impaired sensation;Increased edema;Decreased safety awareness;Decreased endurance;Decreased activity tolerance;Pain;Impaired UE functional use;Decreased knowledge of use of DME;Decreased  balance;Decreased cognition;Decreased mobility;Decreased strength   Rehab Potential Good   OT Frequency 2x / week   OT Duration 8 weeks   OT Treatment/Interventions Self-care/ADL training;Therapeutic exercise;Functional Mobility Training;Patient/family education;Splinting;Balance training;Manual Therapy;Neuromuscular education;Ultrasound;Therapeutic exercises;Therapeutic activities;DME and/or AE instruction;Parrafin;Cryotherapy;Electrical Stimulation;Fluidtherapy;Scar mobilization;Cognitive remediation/compensation;Passive range of motion;Contrast Bath;Moist Heat        Problem List Patient Active Problem List   Diagnosis Date Noted  . Stenosis of carotid artery   . Essential hypertension   .  HLD (hyperlipidemia)   . CVA (cerebral infarction) 01/22/2015  . CAD S/P percutaneous coronary angioplasty 09/19/2014  . Coronary artery disease involving coronary bypass graft of native heart with other forms of angina pectoris ([place]) 09/19/2014  . Shortness of breath 09/19/2014  . Polypharmacy 09/19/2014  . Abdominal aortic aneurysm ([place])   . Stroke ([place])   . Hyperlipidemia LDL goal <70   . Type II diabetes mellitus with complication ([place])   . Coronary artery disease involving native coronary artery   . Abnormal TSH 06/19/2014  . Obesity 06/19/2014  . Bilateral lower extremity edema 03/30/2014  . Dizziness 02/21/2014  . Bradycardia 08/31/2013  . Postoperative atrial fibrillation (HCC)     Class: Temporary  . Diabetes mellitus type 2 in obese ([place])   . Moderate essential hypertension   . Apnea, sleep   . Balanitis xerotica obliterans   . History of: Non-STEMI (non-ST elevated myocardial infarction) 11/16/2010    Class: History of  . S/P CABG x 2 11/16/2010    RINE,KATHRYN 03/05/2015, 2:44 PM Theone Murdoch, OTR/L [phone] Phone: [phone] 2:44 PM 10/18/2016[facility] [address] [place], [place],  [address] Phone: [phone]   Fax:  [phone]  Name: [name] MRN: [id number] Date of Birth: 09/04/1930

## 2015-03-07 ENCOUNTER — Ambulatory Visit: Payer: Medicare Other | Admitting: Occupational Therapy

## 2015-03-07 DIAGNOSIS — R201 Hypoesthesia of skin: Secondary | ICD-10-CM | POA: Diagnosis not present

## 2015-03-07 DIAGNOSIS — I69898 Other sequelae of other cerebrovascular disease: Secondary | ICD-10-CM | POA: Diagnosis not present

## 2015-03-07 DIAGNOSIS — R531 Weakness: Secondary | ICD-10-CM | POA: Diagnosis not present

## 2015-03-07 DIAGNOSIS — I69319 Unspecified symptoms and signs involving cognitive functions following cerebral infarction: Secondary | ICD-10-CM | POA: Diagnosis not present

## 2015-03-07 DIAGNOSIS — R279 Unspecified lack of coordination: Secondary | ICD-10-CM | POA: Diagnosis not present

## 2015-03-07 DIAGNOSIS — R278 Other lack of coordination: Secondary | ICD-10-CM

## 2015-03-07 DIAGNOSIS — IMO0002 Reserved for concepts with insufficient information to code with codable children: Secondary | ICD-10-CM

## 2015-03-07 NOTE — Therapy (Signed)
[facility] [address] [place], [place], [address] Phone: [phone]   Fax:  [phone]  Occupational Therapy Treatment  Patient Details  Name: [name] MRN: [id number] Date of Birth: 1930/05/23 No Data Recorded  Encounter Date: 03/07/2015      OT End of Session - 03/07/15 1302    Visit Number 10   Number of Visits 17   Date for OT Re-Evaluation 03/31/15   Authorization Type Medicare   Authorization - Visit Number 10   Authorization - Number of Visits 10   OT Start Time 6734   OT Stop Time 1145   OT Time Calculation (min) 40 min   Activity Tolerance Patient tolerated treatment well   Behavior During Therapy [facility] for tasks assessed/performed      Past Medical History  Diagnosis Date  . Non-STEMI (non-ST elevated myocardial infarction) July 2012    1. Ostial LAD 70% (to close to [facility] for PCI), subtotal occlusion of the RCA  . CAD in native artery     a. NSTEMI 11/2010 - CABG x2(LIMA to LAD, SVG to PDA-11/2010). b. NEGATIVE Lexiscan Myoview 6/9/'15, EF 53%, no perfusion abnormality, septal and apical HK noted. C. NSTEMI 05/2014 - s/p DES to SVG-RPDA 06/18/14 (Xience Alpine DES 3.0 x 18 mm -3.35 mm), EF 60-65%.  . 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 Servando Snare)  . Postoperative atrial fibrillation     Post CABG, no sign recurrence  . HTN (hypertension)   . Type II diabetes mellitus with complication   . Hyperlipidemia LDL goal <70   . 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 Refusal of blood transfusions as patient is Jehovah's Witness   . Sleep apnea   . Abdominal aortic aneurysm     a. Aortic duplex 06/2014: mild aneurysmal dilatation of proximal abdominal aorta measuring 3.4x3.4cm. No sig change from 2012. F/u due 06/2016.  . Dizziness     a. Carotid duplex 03/2014: mild fibrous plaque, no significant stenosis.  . Stroke     a. 2014 with mild right hand  weakness, nonhemorrhagic per pt.    Past Surgical History  Procedure Laterality Date  . Cardiac catheterization  12/11/2010    Dr. Chase Picket - subsequent cath - normal LV systolic function, no renal artery stenosis, severe 2-vessel disease with subtotaled RCA prox and distal 60% lesion and complex 70% area of narrowing of ostium of LAD  . Coronary artery bypass graft  12/15/2010    X2, Dr Servando Snare; LIMA to LAD, SVG to PDA;   Marland Kitchen Cystoscopy with urethral dilatation    . Transthoracic echocardiogram  07/28/2012    EF 55-60%, severe Conc LVH, Nl Systolic fxn, G1 DD,  Ao Sclerosis w/o stenosis; CRO PFO  . Left heart catheterization with coronary angiogram N/A 06/18/2014    Procedure: LEFT HEART CATHETERIZATION WITH CORONARY ANGIOGRAM;  Surgeon: Leonie Man, MD;  Location: [facility] CATH LAB;  Service: Cardiovascular;  Laterality: N/A;  . Radiology with anesthesia N/A 01/24/2015    Procedure: STENT ASSISTED ANGIOPLASTY (RADIOLOGY WITH ANESTHESIA);  Surgeon: Luanne Bras, MD;  Location: [facility];  Service: Radiology;  Laterality: N/A;    There were no vitals filed for this visit.  Visit Diagnosis:  Weakness due to cerebrovascular accident  Decreased coordination      Subjective Assessment - 03/07/15 1109    Subjective  Pt reports that thumb is unusually  sore today.  Pt reports that this is improved after modalities and pt demo incr ROM.  Pt report that he is using RUE for all that he can.   Pertinent History see Epic   Patient Stated Goals use RUE   Pain Score 6    Pain Location --  thumb   Pain Orientation Right   Pain Descriptors / Indicators Aching   Pain Frequency Intermittent   Aggravating Factors  overuse   Pain Relieving Factors heat                      OT Treatments/Exercises (OP) - 03/07/15 0001    Fine Motor Coordination   Other Fine Motor Exercises Placing cylinder pegs of various sizes into arc pegboard.  (Pt able to do large and medium pegs).  Therapist gave  pt min facilitation at MP to incr IP flex during grasp.  Pt instructed to focus on IP flex for grasp of smaller objects at home.   Modalities   Modalities Fluidotherapy;Building control surveyor for finger flex   Electrical Stimulation Parameters 50pps, 250 pulse width, 10sec cycle, 3sec ramp, intensity= x43min with no adverse reactions   Electrical Stimulation Goals Neuromuscular facilitation  ROM   RUE Fluidotherapy   Number Minutes Fluidotherapy 8 Minutes   RUE Fluidotherapy Location Hand;Wrist   Comments with no adverse reactions for stiffness/pain      checked box and blocks test wrist ROM for G-code and discussed progress.  See goal section for scores.          OT Education - 03/07/15 1635    Education Details emphasized importance of thumb IP flex and how to facilitate for picking up smaller objects   Person(s) Educated Patient   Methods Explanation;Demonstration;Verbal cues   Comprehension Verbalized understanding;Returned demonstration          OT Short Term Goals - 03/01/15 7517    OT SHORT TERM GOAL #1   Title I with inital HEP.   Status Achieved   OT SHORT TERM GOAL #2   Title I with RUE positioning to minimize pain and risk for injury, including splint wear, care PRN and verbalize understand ing of sensory precautions.   Status Achieved   OT SHORT TERM GOAL #3   Title Pt will demonstrate 120 shoulder flexion with RUE with pain less than or equal to 3/10 for improved functional reach.   Status Achieved   OT SHORT TERM GOAL #4   Title Pt will demonstrate 50 % composite finger flexion with RUE in prep for functional grasp.   Status Achieved   OT SHORT TERM GOAL #5   Title Pt will report ability to use RUE as a stabilizer/ gross A at least 25 % of the time for ADLs/ IADLs.   Status Achieved   OT SHORT TERM GOAL #6   Title Pt will verbzalize understanding of  compensatory strategies for short term memory deficits.   Status Achieved           OT Long Term Goals - 03/07/15 1303    OT LONG TERM GOAL #1   Title Pt will demonstrate improved RUE functional use as eveidenced by performing 30 blocks on box/ blocks test.   Time 8   Period Weeks   Status Revised  Met initial LTG 03/07/15 with 24 blocks and upgraded goal   OT LONG TERM GOAL #2  Title Pt will demonstrate wrist extension WFLS for ADLs.   Time 8   Period Weeks   Status Achieved  60*  15-Mar-2015   OT LONG TERM GOAL #3   Title Pt will use RUE as a gross A / stabilizer at least 50 % of the time for ADLS/ IADLs with pain less than or equal  to 3/10.   Time 8   Period Weeks   Status New   OT LONG TERM GOAL #4   Title Pt will perform light home management/ cooking at a modified independent level demonstrating good safety awareness.   Time 8   Period Weeks   Status New               Plan - 03-15-2015 1303    Clinical Impression Statement Pt is progressing with increased coordination and RUE functional use.  finger ROM/pain improved after modalities.  Met LTGs 1-2 and upgraded LTG #1.  Pt would benefit from continued occupational therapy to maximize dominant RUE functional use.   Plan RUE functional use, neuro re-ed   OT Home Exercise Plan issued inital HEP 9/22   Consulted and Agree with Plan of Care Patient          G-Codes - 03-15-2015 1112    Functional Assessment Tool Used Box/ blocks RUE-24 blocks, 50-75% finger flexion, 75% gross finger ext, 60 wrist ext.   Functional Limitation Carrying, moving and handling objects   Carrying, Moving and Handling Objects Current Status [phone]) At least 40 percent but less than 60 percent impaired, limited or restricted   Carrying, Moving and Handling Objects Goal Status ([id number]) At least 20 percent but less than 40 percent impaired, limited or restricted  upgraded     Occupational Therapy Progress Note  Dates of Reporting Period:  01/31/15 to March 15, 2015   Objective Reports of Subjective Statement: see above  Objective Measurements: see above  Goal Update: see above  Plan: see above  Reason Skilled Services are Required: see above    Problem List Patient Active Problem List   Diagnosis Date Noted  . Stenosis of carotid artery   . Essential hypertension   . HLD (hyperlipidemia)   . CVA (cerebral infarction) 01/22/2015  . CAD S/P percutaneous coronary angioplasty 09/19/2014  . Coronary artery disease involving coronary bypass graft of native heart with other forms of angina pectoris ([place]) 09/19/2014  . Shortness of breath 09/19/2014  . Polypharmacy 09/19/2014  . Abdominal aortic aneurysm ([place])   . Stroke ([place])   . Hyperlipidemia LDL goal <70   . Type II diabetes mellitus with complication ([place])   . Coronary artery disease involving native coronary artery   . Abnormal TSH 06/19/2014  . Obesity 06/19/2014  . Bilateral lower extremity edema 03/30/2014  . Dizziness 02/21/2014  . Bradycardia 08/31/2013  . Postoperative atrial fibrillation (HCC)     Class: Temporary  . Diabetes mellitus type 2 in obese ([place])   . Moderate essential hypertension   . Apnea, sleep   . Balanitis xerotica obliterans   . History of: Non-STEMI (non-ST elevated myocardial infarction) 11/16/2010    Class: History of  . S/P CABG x 2 11/16/2010    [facility] 2015-03-15, 4:36 PM  [facility] [address] [place], [place], [address] Phone: [phone]   Fax:  [phone]  Name: [name] MRN: [id number] Date of Birth: 10/02/1930  Vianne Bulls, OTR/L 15-Mar-2015 4:36 PM

## 2015-03-12 ENCOUNTER — Ambulatory Visit: Payer: Medicare Other | Admitting: Occupational Therapy

## 2015-03-12 DIAGNOSIS — I69319 Unspecified symptoms and signs involving cognitive functions following cerebral infarction: Secondary | ICD-10-CM | POA: Diagnosis not present

## 2015-03-12 DIAGNOSIS — R278 Other lack of coordination: Secondary | ICD-10-CM

## 2015-03-12 DIAGNOSIS — R201 Hypoesthesia of skin: Secondary | ICD-10-CM

## 2015-03-12 DIAGNOSIS — I69898 Other sequelae of other cerebrovascular disease: Secondary | ICD-10-CM | POA: Diagnosis not present

## 2015-03-12 DIAGNOSIS — R279 Unspecified lack of coordination: Secondary | ICD-10-CM | POA: Diagnosis not present

## 2015-03-12 DIAGNOSIS — IMO0002 Reserved for concepts with insufficient information to code with codable children: Secondary | ICD-10-CM

## 2015-03-12 DIAGNOSIS — R531 Weakness: Secondary | ICD-10-CM | POA: Diagnosis not present

## 2015-03-12 NOTE — Therapy (Signed)
[facility] [address] [place], [place], [address] Phone: [phone]   Fax:  [phone]  Occupational Therapy Treatment  Patient Details  Name: [name] MRN: [id number] Date of Birth: 1930/12/31 No Data Recorded  Encounter Date: 03/12/2015      OT End of Session - 03/12/15 1001    Visit Number 11   Number of Visits 17   Date for OT Re-Evaluation 03/31/15   Authorization Type Medicare   Authorization - Visit Number 11   Authorization - Number of Visits 20   OT Start Time 0936   OT Stop Time 1015   OT Time Calculation (min) 39 min   Activity Tolerance Patient tolerated treatment well   Behavior During Therapy [facility] for tasks assessed/performed      Past Medical History  Diagnosis Date  . Non-STEMI (non-ST elevated myocardial infarction) July 2012    1. Ostial LAD 70% (to close to [facility] for PCI), subtotal occlusion of the RCA  . CAD in native artery     a. NSTEMI 11/2010 - CABG x2(LIMA to LAD, SVG to PDA-11/2010). b. NEGATIVE Lexiscan Myoview 6/9/'15, EF 53%, no perfusion abnormality, septal and apical HK noted. C. NSTEMI 05/2014 - s/p DES to SVG-RPDA 06/18/14 (Xience Alpine DES 3.0 x 18 mm -3.35 mm), EF 60-65%.  . 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 Tyrone Sage)  . Postoperative atrial fibrillation     Post CABG, no sign recurrence  . HTN (hypertension)   . Type II diabetes mellitus with complication   . Hyperlipidemia LDL goal <70   . 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 Refusal of blood transfusions as patient is Jehovah's Witness   . Sleep apnea   . Abdominal aortic aneurysm     a. Aortic duplex 06/2014: mild aneurysmal dilatation of proximal abdominal aorta measuring 3.4x3.4cm. No sig change from 2012. F/u due 06/2016.  . Dizziness     a. Carotid duplex 03/2014: mild fibrous plaque, no significant stenosis.  . Stroke     a. 2014 with mild right hand  weakness, nonhemorrhagic per pt.    Past Surgical History  Procedure Laterality Date  . Cardiac catheterization  12/11/2010    Dr. Julieanne Manson - subsequent cath - normal LV systolic function, no renal artery stenosis, severe 2-vessel disease with subtotaled RCA prox and distal 60% lesion and complex 70% area of narrowing of ostium of LAD  . Coronary artery bypass graft  12/15/2010    X2, Dr Tyrone Sage; LIMA to LAD, SVG to PDA;   Marland Kitchen Cystoscopy with urethral dilatation    . Transthoracic echocardiogram  07/28/2012    EF 55-60%, severe Conc LVH, Nl Systolic fxn, G1 DD,  Ao Sclerosis w/o stenosis; CRO PFO  . Left heart catheterization with coronary angiogram N/A 06/18/2014    Procedure: LEFT HEART CATHETERIZATION WITH CORONARY ANGIOGRAM;  Surgeon: Marykay Lex, MD;  Location: [facility] CATH LAB;  Service: Cardiovascular;  Laterality: N/A;  . Radiology with anesthesia N/A 01/24/2015    Procedure: STENT ASSISTED ANGIOPLASTY (RADIOLOGY WITH ANESTHESIA);  Surgeon: Julieanne Cotton, MD;  Location: [facility] OR;  Service: Radiology;  Laterality: N/A;    There were no vitals filed for this visit.  Visit Diagnosis:  Weakness due to cerebrovascular accident  Decreased coordination  Impaired sensation  Cognitive deficits following cerebral infarction      Subjective Assessment - 03/12/15 7812  Pertinent History see Epic   Patient Stated Goals use RUE   Currently in Pain? Yes   Pain Score 6    Pain Location --  thumb   Pain Orientation Right   Pain Descriptors / Indicators Aching   Pain Type Acute pain   Pain Onset 1 to 4 weeks ago   Pain Frequency Intermittent   Aggravating Factors  overuse   Pain Relieving Factors heat   Effect of Pain on Daily Activities limits daily activities   Multiple Pain Sites No        Treatment; Fluidotherapy x 9 mins to RUE for stiffness and pain, no adverse reactions. AA/ROM finger flexion and thumb IP flexion followed by functional grasp release of cylindrical  objects of various sizes, then checkers, min facilitation/ v.c to avoid compensation. Arm bike x 5 mins level 1 for reciprocal movement with RUE wrapped.                        OT Short Term Goals - 03/01/15 5643    OT SHORT TERM GOAL #1   Title I with inital HEP.   Status Achieved   OT SHORT TERM GOAL #2   Title I with RUE positioning to minimize pain and risk for injury, including splint wear, care PRN and verbalize understand ing of sensory precautions.   Status Achieved   OT SHORT TERM GOAL #3   Title Pt will demonstrate 120 shoulder flexion with RUE with pain less than or equal to 3/10 for improved functional reach.   Status Achieved   OT SHORT TERM GOAL #4   Title Pt will demonstrate 50 % composite finger flexion with RUE in prep for functional grasp.   Status Achieved   OT SHORT TERM GOAL #5   Title Pt will report ability to use RUE as a stabilizer/ gross A at least 25 % of the time for ADLs/ IADLs.   Status Achieved   OT SHORT TERM GOAL #6   Title Pt will verbzalize understanding of compensatory strategies for short term memory deficits.   Status Achieved           OT Long Term Goals - 03/07/15 1303    OT LONG TERM GOAL #1   Title Pt will demonstrate improved RUE functional use as eveidenced by performing 30 blocks on box/ blocks test.   Time 8   Period Weeks   Status Revised  Met initial LTG 03/07/15 with 24 blocks and upgraded goal   OT LONG TERM GOAL #2   Title Pt will demonstrate wrist extension WFLS for ADLs.   Time 8   Period Weeks   Status Achieved  60*  03/07/15   OT LONG TERM GOAL #3   Title Pt will use RUE as a gross A / stabilizer at least 50 % of the time for ADLS/ IADLs with pain less than or equal  to 3/10.   Time 8   Period Weeks   Status New   OT LONG TERM GOAL #4   Title Pt will perform light home management/ cooking at a modified independent level demonstrating good safety awareness.   Time 8   Period Weeks   Status New                Plan - 03/12/15 3295    Clinical Impression Statement Pt is progressing towards goals limited by pain and stiffness.   Pt will benefit from skilled therapeutic intervention in order  to improve on the following deficits (Retired) Abnormal gait;Decreased coordination;Decreased range of motion;Impaired sensation;Increased edema;Decreased safety awareness;Decreased endurance;Decreased activity tolerance;Pain;Impaired UE functional use;Decreased knowledge of use of DME;Decreased balance;Decreased cognition;Decreased mobility;Decreased strength   Rehab Potential Good   Clinical Impairments Affecting Rehab Potential sensory impairment   OT Frequency 2x / week   OT Duration 8 weeks   OT Treatment/Interventions Self-care/ADL training;Therapeutic exercise;Functional Mobility Training;Patient/family education;Splinting;Balance training;Manual Therapy;Neuromuscular education;Ultrasound;Therapeutic exercises;Therapeutic activities;DME and/or AE instruction;Parrafin;Cryotherapy;Electrical Stimulation;Fluidtherapy;Scar mobilization;Cognitive remediation/compensation;Passive range of motion;Contrast Bath;Moist Heat   Plan RUE functional use, neuro re-ed   OT Home Exercise Plan issued inital HEP 9/22   Consulted and Agree with Plan of Care Patient        Problem List Patient Active Problem List   Diagnosis Date Noted  . Stenosis of carotid artery   . Essential hypertension   . HLD (hyperlipidemia)   . CVA (cerebral infarction) 01/22/2015  . CAD S/P percutaneous coronary angioplasty 09/19/2014  . Coronary artery disease involving coronary bypass graft of native heart with other forms of angina pectoris ([place]) 09/19/2014  . Shortness of breath 09/19/2014  . Polypharmacy 09/19/2014  . Abdominal aortic aneurysm ([place])   . Stroke ([place])   . Hyperlipidemia LDL goal <70   . Type II diabetes mellitus with complication ([place])   . Coronary artery disease involving native coronary artery   .  Abnormal TSH 06/19/2014  . Obesity 06/19/2014  . Bilateral lower extremity edema 03/30/2014  . Dizziness 02/21/2014  . Bradycardia 08/31/2013  . Postoperative atrial fibrillation (HCC)     Class: Temporary  . Diabetes mellitus type 2 in obese ([place])   . Moderate essential hypertension   . Apnea, sleep   . Balanitis xerotica obliterans   . History of: Non-STEMI (non-ST elevated myocardial infarction) 11/16/2010    Class: History of  . S/P CABG x 2 11/16/2010    Tracker Mance 03/12/2015, 10:15 AM Theone Murdoch, OTR/L [phone] Phone: [phone] 10:26 AM 03/12/2015 [facility] [address] [place], [place], [address] Phone: [phone]   Fax:  [phone]  Name: [name] MRN: [id number] Date of Birth: 05-Nov-1930

## 2015-03-13 DIAGNOSIS — E1129 Type 2 diabetes mellitus with other diabetic kidney complication: Secondary | ICD-10-CM | POA: Diagnosis not present

## 2015-03-13 DIAGNOSIS — E7800 Pure hypercholesterolemia, unspecified: Secondary | ICD-10-CM | POA: Diagnosis not present

## 2015-03-13 DIAGNOSIS — N182 Chronic kidney disease, stage 2 (mild): Secondary | ICD-10-CM | POA: Diagnosis not present

## 2015-03-13 DIAGNOSIS — I10 Essential (primary) hypertension: Secondary | ICD-10-CM | POA: Diagnosis not present

## 2015-03-13 DIAGNOSIS — Z6829 Body mass index (BMI) 29.0-29.9, adult: Secondary | ICD-10-CM | POA: Diagnosis not present

## 2015-03-13 DIAGNOSIS — E559 Vitamin D deficiency, unspecified: Secondary | ICD-10-CM | POA: Diagnosis not present

## 2015-03-15 ENCOUNTER — Ambulatory Visit: Payer: Medicare Other | Admitting: Occupational Therapy

## 2015-03-15 DIAGNOSIS — R279 Unspecified lack of coordination: Secondary | ICD-10-CM

## 2015-03-15 DIAGNOSIS — R201 Hypoesthesia of skin: Secondary | ICD-10-CM | POA: Diagnosis not present

## 2015-03-15 DIAGNOSIS — I69319 Unspecified symptoms and signs involving cognitive functions following cerebral infarction: Secondary | ICD-10-CM | POA: Diagnosis not present

## 2015-03-15 DIAGNOSIS — R278 Other lack of coordination: Secondary | ICD-10-CM

## 2015-03-15 DIAGNOSIS — I69898 Other sequelae of other cerebrovascular disease: Secondary | ICD-10-CM | POA: Diagnosis not present

## 2015-03-15 DIAGNOSIS — IMO0002 Reserved for concepts with insufficient information to code with codable children: Secondary | ICD-10-CM

## 2015-03-15 DIAGNOSIS — R531 Weakness: Secondary | ICD-10-CM | POA: Diagnosis not present

## 2015-03-15 NOTE — Patient Instructions (Addendum)
1. Grip Strengthening (Resistive Putty)- yellow, stop if pain increases    Squeeze putty using thumb and all fingers. Repeat _20___ times. Do _1_ sessions per day.   2. Roll putty into tube on table and pinch between each finger and thumb x 10-20 reps each.  Copyright  VHI. All rights reserved.               Wear at night, remove if it feels too tight!

## 2015-03-15 NOTE — Therapy (Signed)
[facility] [address] [place], [place], [address] Phone: [phone]   Fax:  [phone]  Occupational Therapy Treatment  Patient Details  Name: [name] MRN: [id number] Date of Birth: [birth year] No Data Recorded  Encounter Date: 03/15/2015      OT End of Session - 03/15/15 1136    Visit Number 13   Number of Visits 17   Date for OT Re-Evaluation 03/31/15   Authorization Type Medicare   Authorization - Visit Number 12   Authorization - Number of Visits 20   OT Start Time 1130   OT Stop Time 1215   OT Time Calculation (min) 45 min   Activity Tolerance Patient tolerated treatment well   Behavior During Therapy [facility] for tasks assessed/performed      Past Medical History  Diagnosis Date  . Non-STEMI (non-ST elevated myocardial infarction) July 2012    1. Ostial LAD 70% (to close to [facility] for PCI), subtotal occlusion of the RCA  . CAD in native artery     a. NSTEMI 11/2010 - CABG x2(LIMA to LAD, SVG to PDA-11/2010). b. NEGATIVE Lexiscan Myoview 6/9/'15, EF 53%, no perfusion abnormality, septal and apical HK noted. C. NSTEMI 05/2014 - s/p DES to SVG-RPDA 06/18/14 (Xience Alpine DES 3.0 x 18 mm -3.35 mm), EF 60-65%.  . 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 Servando Snare)  . Postoperative atrial fibrillation     Post CABG, no sign recurrence  . HTN (hypertension)   . Type II diabetes mellitus with complication   . Hyperlipidemia LDL goal <70   . 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 Refusal of blood transfusions as patient is Jehovah's Witness   . Sleep apnea   . Abdominal aortic aneurysm     a. Aortic duplex 06/2014: mild aneurysmal dilatation of proximal abdominal aorta measuring 3.4x3.4cm. No sig change from 2012. F/u due 06/2016.  . Dizziness     a. Carotid duplex 03/2014: mild fibrous plaque, no significant stenosis.  . Stroke     a. 2014 with mild right hand  weakness, nonhemorrhagic per pt.    Past Surgical History  Procedure Laterality Date  . Cardiac catheterization  12/11/2010    Dr. Chase Picket - subsequent cath - normal LV systolic function, no renal artery stenosis, severe 2-vessel disease with subtotaled RCA prox and distal 60% lesion and complex 70% area of narrowing of ostium of LAD  . Coronary artery bypass graft  12/15/2010    X2, Dr Servando Snare; LIMA to LAD, SVG to PDA;   Marland Kitchen Cystoscopy with urethral dilatation    . Transthoracic echocardiogram  07/28/2012    EF 55-60%, severe Conc LVH, Nl Systolic fxn, G1 DD,  Ao Sclerosis w/o stenosis; CRO PFO  . Left heart catheterization with coronary angiogram N/A 06/18/2014    Procedure: LEFT HEART CATHETERIZATION WITH CORONARY ANGIOGRAM;  Surgeon: Leonie Man, MD;  Location: [facility] CATH LAB;  Service: Cardiovascular;  Laterality: N/A;  . Radiology with anesthesia N/A 01/24/2015    Procedure: STENT ASSISTED ANGIOPLASTY (RADIOLOGY WITH ANESTHESIA);  Surgeon: Luanne Bras, MD;  Location: [facility];  Service: Radiology;  Laterality: N/A;    There were no vitals filed for this visit.  Visit Diagnosis:  Weakness due to cerebrovascular accident  Impaired sensation  Decreased coordination      Subjective Assessment - 03/15/15 1132    Subjective  Pt reports pain  in hand pain 6-7/10 last night   Pertinent History see Epic   Patient Stated Goals use RUE   Currently in Pain? Yes   Pain Score 7    Pain Location Hand   Pain Orientation Right   Pain Descriptors / Indicators Aching   Pain Type Acute pain   Pain Onset 1 to 4 weeks ago   Pain Frequency Intermittent   Aggravating Factors  overuse   Pain Relieving Factors heat   Effect of Pain on Daily Activities limits daily activities   Multiple Pain Sites No      Treatment: Fluidotherapy x 10 mins due to RUE stiffness and pain, no adverse reactions. NMES 50 pps, 250 pw, 10 secs cycle to finger flexors x 9 mins with pt performing finger flexion  during on cycle.  Pt was issued yellow theraputty HEP, he returned demonstration. Functional mid range reaching to place 1 inch blocks in elevated container. Min v.c. Arm bike x 5 mins for reciprocal movement with hand wrapped, Edema glove issued and pt was educated in wear. Pt reports planning to use an ax at home. Pt was cautioned against use of axe, hatchet, power tools and that he should not climb a ladder due to risk for injury. Pt verbalized understanding of recommendations yet pt is likely to perform these activities anyway.                         OT Education - 03/15/15 1633    Education provided Yes   Education Details edema glaove, puty exercises, and safety recommendations   Person(s) Educated Patient   Methods Explanation;Demonstration;Verbal cues;Handout   Comprehension Verbalized understanding;Returned demonstration;Verbal cues required          OT Short Term Goals - 03/01/15 0952    OT SHORT TERM GOAL #1   Title I with inital HEP.   Status Achieved   OT SHORT TERM GOAL #2   Title I with RUE positioning to minimize pain and risk for injury, including splint wear, care PRN and verbalize understand ing of sensory precautions.   Status Achieved   OT SHORT TERM GOAL #3   Title Pt will demonstrate 120 shoulder flexion with RUE with pain less than or equal to 3/10 for improved functional reach.   Status Achieved   OT SHORT TERM GOAL #4   Title Pt will demonstrate 50 % composite finger flexion with RUE in prep for functional grasp.   Status Achieved   OT SHORT TERM GOAL #5   Title Pt will report ability to use RUE as a stabilizer/ gross A at least 25 % of the time for ADLs/ IADLs.   Status Achieved   OT SHORT TERM GOAL #6   Title Pt will verbzalize understanding of compensatory strategies for short term memory deficits.   Status Achieved           OT Long Term Goals - 03/15/15 1135    OT LONG TERM GOAL #1   Title Pt will demonstrate improved RUE  functional use as eveidenced by performing 30 blocks on box/ blocks test.   Baseline check 03/30/15   Time 8   Period Weeks   Status Revised  Met initial LTG 03/07/15 with 24 blocks and upgraded goal   OT LONG TERM GOAL #2   Title Pt will demonstrate wrist extension WFLS for ADLs.   Time 8   Period Weeks   Status Achieved  60*  03/07/15  OT LONG TERM GOAL #3   Title Pt will use RUE as a gross A / stabilizer at least 50 % of the time for ADLS/ IADLs with pain less than or equal  to 3/10.   Time 8   Period Weeks   Status New   OT LONG TERM GOAL #4   Title Pt will perform light home management/ cooking at a modified independent level demonstrating good safety awareness.   Time 8   Period Weeks   Status New               Plan - 03/15/15 1134    Clinical Impression Statement Pt is progressing towards goals for RUE functional use . Pt responds well to estim for finger flexors. Pt deoes not demonstrate good safety awareness. He was cautioned against using power tools and a htchet due to decreased RUE functional use and sensation.   Pt will benefit from skilled therapeutic intervention in order to improve on the following deficits (Retired) Abnormal gait;Decreased coordination;Decreased range of motion;Impaired sensation;Increased edema;Decreased safety awareness;Decreased endurance;Decreased activity tolerance;Pain;Impaired UE functional use;Decreased knowledge of use of DME;Decreased balance;Decreased cognition;Decreased mobility;Decreased strength   Plan RUE functional use, neuro re-ed   OT Home Exercise Plan issued inital HEP 9/22   Consulted and Agree with Plan of Care Patient        Problem List Patient Active Problem List   Diagnosis Date Noted  . Stenosis of carotid artery   . Essential hypertension   . HLD (hyperlipidemia)   . CVA (cerebral infarction) 01/22/2015  . CAD S/P percutaneous coronary angioplasty 09/19/2014  . Coronary artery disease involving coronary  bypass graft of native heart with other forms of angina pectoris ([place]) 09/19/2014  . Shortness of breath 09/19/2014  . Polypharmacy 09/19/2014  . Abdominal aortic aneurysm ([place])   . Stroke ([place])   . Hyperlipidemia LDL goal <70   . Type II diabetes mellitus with complication ([place])   . Coronary artery disease involving native coronary artery   . Abnormal TSH 06/19/2014  . Obesity 06/19/2014  . Bilateral lower extremity edema 03/30/2014  . Dizziness 02/21/2014  . Bradycardia 08/31/2013  . Postoperative atrial fibrillation (HCC)     Class: Temporary  . Diabetes mellitus type 2 in obese ([place])   . Moderate essential hypertension   . Apnea, sleep   . Balanitis xerotica obliterans   . History of: Non-STEMI (non-ST elevated myocardial infarction) 11/16/2010    Class: History of  . S/P CABG x 2 11/16/2010    Willadean Guyton 03/15/2015, 4:33 PM Theone Murdoch, OTR/L [phone] Phone: [phone] 4:33 PM 03/15/2015 [facility] [address] [place], [place], [address] Phone: [phone]   Fax:  [phone]  Name: [name] MRN: [id number] Date of Birth: 1931-04-08

## 2015-03-19 ENCOUNTER — Ambulatory Visit: Payer: Medicare Other | Attending: Neurology | Admitting: Occupational Therapy

## 2015-03-19 DIAGNOSIS — I69898 Other sequelae of other cerebrovascular disease: Secondary | ICD-10-CM | POA: Diagnosis not present

## 2015-03-19 DIAGNOSIS — R279 Unspecified lack of coordination: Secondary | ICD-10-CM | POA: Diagnosis not present

## 2015-03-19 DIAGNOSIS — R201 Hypoesthesia of skin: Secondary | ICD-10-CM | POA: Diagnosis not present

## 2015-03-19 DIAGNOSIS — IMO0002 Reserved for concepts with insufficient information to code with codable children: Secondary | ICD-10-CM

## 2015-03-19 DIAGNOSIS — R531 Weakness: Secondary | ICD-10-CM | POA: Diagnosis not present

## 2015-03-19 DIAGNOSIS — R278 Other lack of coordination: Secondary | ICD-10-CM

## 2015-03-19 NOTE — Therapy (Signed)
[facility] [address] [place], [place], [address] Phone: [phone]   Fax:  [phone]  Occupational Therapy Treatment  Patient Details  Name: [name] MRN: [id number] Date of Birth: 1931-02-10 No Data Recorded  Encounter Date: 03/19/2015      OT End of Session - 03/19/15 1146    Visit Number 14   Number of Visits 17   Date for OT Re-Evaluation 03/31/15   Authorization Type Medicare   Authorization - Visit Number 14   Authorization - Number of Visits 20   OT Start Time 2774   OT Stop Time 1145   OT Time Calculation (min) 42 min   Activity Tolerance Patient tolerated treatment well   Behavior During Therapy [facility] for tasks assessed/performed      Past Medical History  Diagnosis Date  . Non-STEMI (non-ST elevated myocardial infarction) July 2012    1. Ostial LAD 70% (to close to [facility] for PCI), subtotal occlusion of the RCA  . CAD in native artery     a. NSTEMI 11/2010 - CABG x2(LIMA to LAD, SVG to PDA-11/2010). b. NEGATIVE Lexiscan Myoview 6/9/'15, EF 53%, no perfusion abnormality, septal and apical HK noted. C. NSTEMI 05/2014 - s/p DES to SVG-RPDA 06/18/14 (Xience Alpine DES 3.0 x 18 mm -3.35 mm), EF 60-65%.  . 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 Servando Snare)  . Postoperative atrial fibrillation     Post CABG, no sign recurrence  . HTN (hypertension)   . Type II diabetes mellitus with complication   . Hyperlipidemia LDL goal <70   . 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 Refusal of blood transfusions as patient is Jehovah's Witness   . Sleep apnea   . Abdominal aortic aneurysm     a. Aortic duplex 06/2014: mild aneurysmal dilatation of proximal abdominal aorta measuring 3.4x3.4cm. No sig change from 2012. F/u due 06/2016.  . Dizziness     a. Carotid duplex 03/2014: mild fibrous plaque, no significant stenosis.  . Stroke     a. 2014 with mild right hand  weakness, nonhemorrhagic per pt.    Past Surgical History  Procedure Laterality Date  . Cardiac catheterization  12/11/2010    Dr. Chase Picket - subsequent cath - normal LV systolic function, no renal artery stenosis, severe 2-vessel disease with subtotaled RCA prox and distal 60% lesion and complex 70% area of narrowing of ostium of LAD  . Coronary artery bypass graft  12/15/2010    X2, Dr Servando Snare; LIMA to LAD, SVG to PDA;   Marland Kitchen Cystoscopy with urethral dilatation    . Transthoracic echocardiogram  07/28/2012    EF 55-60%, severe Conc LVH, Nl Systolic fxn, G1 DD,  Ao Sclerosis w/o stenosis; CRO PFO  . Left heart catheterization with coronary angiogram N/A 06/18/2014    Procedure: LEFT HEART CATHETERIZATION WITH CORONARY ANGIOGRAM;  Surgeon: Leonie Man, MD;  Location: [facility] CATH LAB;  Service: Cardiovascular;  Laterality: N/A;  . Radiology with anesthesia N/A 01/24/2015    Procedure: STENT ASSISTED ANGIOPLASTY (RADIOLOGY WITH ANESTHESIA);  Surgeon: Luanne Bras, MD;  Location: [place];  Service: Radiology;  Laterality: N/A;    There were no vitals filed for this visit.  Visit Diagnosis:  Weakness due to cerebrovascular accident  Impaired sensation  Decreased coordination      Subjective Assessment - 03/19/15 1144    Pertinent History see Epic  Patient Stated Goals use RUE   Currently in Pain? Yes   Pain Score 7    Pain Location Hand   Pain Orientation Right   Pain Descriptors / Indicators Aching   Pain Type Acute pain   Pain Onset More than a month ago   Pain Frequency Intermittent   Aggravating Factors  overuse   Pain Relieving Factors heat   Effect of Pain on Daily Activities limits daily activities   Multiple Pain Sites No        Treatment: Fluidotherapy x 9 mins for stiffness and pain, no adverse reactions. AA/ROM finger flexion/ extension followed by functional overhead reaching with RUE to remove/ place items in cabinets.  Placing medium pegs in pegboard with RUE  for fine motor coordination mod difficulty/ v.c. To avoid compensation. Reviewed yellow theraputty HEP, min v.c.  Arm bike x 5 mins level 3 for reciprocal movement with hand wrapped.                            OT Short Term Goals - 03/01/15 6222    OT SHORT TERM GOAL #1   Title I with inital HEP.   Status Achieved   OT SHORT TERM GOAL #2   Title I with RUE positioning to minimize pain and risk for injury, including splint wear, care PRN and verbalize understand ing of sensory precautions.   Status Achieved   OT SHORT TERM GOAL #3   Title Pt will demonstrate 120 shoulder flexion with RUE with pain less than or equal to 3/10 for improved functional reach.   Status Achieved   OT SHORT TERM GOAL #4   Title Pt will demonstrate 50 % composite finger flexion with RUE in prep for functional grasp.   Status Achieved   OT SHORT TERM GOAL #5   Title Pt will report ability to use RUE as a stabilizer/ gross A at least 25 % of the time for ADLs/ IADLs.   Status Achieved   OT SHORT TERM GOAL #6   Title Pt will verbzalize understanding of compensatory strategies for short term memory deficits.   Status Achieved           OT Long Term Goals - 03/15/15 1135    OT LONG TERM GOAL #1   Title Pt will demonstrate improved RUE functional use as eveidenced by performing 30 blocks on box/ blocks test.   Baseline check 03/30/15   Time 8   Period Weeks   Status Revised  Met initial LTG 03/07/15 with 24 blocks and upgraded goal   OT LONG TERM GOAL #2   Title Pt will demonstrate wrist extension WFLS for ADLs.   Time 8   Period Weeks   Status Achieved  60*  03/07/15   OT LONG TERM GOAL #3   Title Pt will use RUE as a gross A / stabilizer at least 50 % of the time for ADLS/ IADLs with pain less than or equal  to 3/10.   Time 8   Period Weeks   Status New   OT LONG TERM GOAL #4   Title Pt will perform light home management/ cooking at a modified independent level demonstrating  good safety awareness.   Time 8   Period Weeks   Status New               Plan - 03/19/15 1145    Clinical Impression Statement Pt is progressing towards goals. anticipate  d/c in the next week or 2 per pt request   Pt will benefit from skilled therapeutic intervention in order to improve on the following deficits (Retired) Abnormal gait;Decreased coordination;Decreased range of motion;Impaired sensation;Increased edema;Decreased safety awareness;Decreased endurance;Decreased activity tolerance;Pain;Impaired UE functional use;Decreased knowledge of use of DME;Decreased balance;Decreased cognition;Decreased mobility;Decreased strength   Rehab Potential Good   Clinical Impairments Affecting Rehab Potential sensory impairment   OT Frequency 2x / week   OT Duration 8 weeks   OT Treatment/Interventions Self-care/ADL training;Therapeutic exercise;Functional Mobility Training;Patient/family education;Splinting;Balance training;Manual Therapy;Neuromuscular education;Ultrasound;Therapeutic exercises;Therapeutic activities;DME and/or AE instruction;Parrafin;Cryotherapy;Electrical Stimulation;Fluidtherapy;Scar mobilization;Cognitive remediation/compensation;Passive range of motion;Contrast Bath;Moist Heat   Plan RUE functional use, neuro re-ed   Consulted and Agree with Plan of Care Patient        Problem List Patient Active Problem List   Diagnosis Date Noted  . Stenosis of carotid artery   . Essential hypertension   . HLD (hyperlipidemia)   . CVA (cerebral infarction) 01/22/2015  . CAD S/P percutaneous coronary angioplasty 09/19/2014  . Coronary artery disease involving coronary bypass graft of native heart with other forms of angina pectoris ([place]) 09/19/2014  . Shortness of breath 09/19/2014  . Polypharmacy 09/19/2014  . Abdominal aortic aneurysm ([place])   . Stroke ([place])   . Hyperlipidemia LDL goal <70   . Type II diabetes mellitus with complication ([place])   . Coronary artery disease  involving native coronary artery   . Abnormal TSH 06/19/2014  . Obesity 06/19/2014  . Bilateral lower extremity edema 03/30/2014  . Dizziness 02/21/2014  . Bradycardia 08/31/2013  . Postoperative atrial fibrillation ([facility])     Class: Temporary  . Diabetes mellitus type 2 in obese ([place])   . Moderate essential hypertension   . Apnea, sleep   . Balanitis xerotica obliterans   . History of: Non-STEMI (non-ST elevated myocardial infarction) 11/16/2010    Class: History of  . S/P CABG x 2 11/16/2010    Darene Nappi 03/19/2015, 11:47 AM Theone Murdoch, OTR/L [phone] Phone: [phone] 11:47 AM 03/19/2015 [facility] [address] [place], [place], [address] Phone: [phone]   Fax:  [phone]  Name: [name] MRN: [id number] Date of Birth: October 22, 1930

## 2015-03-21 ENCOUNTER — Ambulatory Visit: Payer: Medicare Other | Admitting: Occupational Therapy

## 2015-03-21 ENCOUNTER — Ambulatory Visit (INDEPENDENT_AMBULATORY_CARE_PROVIDER_SITE_OTHER): Payer: Medicare Other | Admitting: Neurology

## 2015-03-21 ENCOUNTER — Encounter: Payer: Self-pay | Admitting: Neurology

## 2015-03-21 VITALS — BP 155/78 | HR 54 | Ht 73.0 in | Wt 219.2 lb

## 2015-03-21 DIAGNOSIS — E785 Hyperlipidemia, unspecified: Secondary | ICD-10-CM | POA: Diagnosis not present

## 2015-03-21 DIAGNOSIS — R201 Hypoesthesia of skin: Secondary | ICD-10-CM | POA: Diagnosis not present

## 2015-03-21 DIAGNOSIS — E119 Type 2 diabetes mellitus without complications: Secondary | ICD-10-CM

## 2015-03-21 DIAGNOSIS — I10 Essential (primary) hypertension: Secondary | ICD-10-CM | POA: Diagnosis not present

## 2015-03-21 DIAGNOSIS — Z7984 Long term (current) use of oral hypoglycemic drugs: Secondary | ICD-10-CM

## 2015-03-21 DIAGNOSIS — I6522 Occlusion and stenosis of left carotid artery: Secondary | ICD-10-CM

## 2015-03-21 DIAGNOSIS — I63512 Cerebral infarction due to unspecified occlusion or stenosis of left middle cerebral artery: Secondary | ICD-10-CM

## 2015-03-21 DIAGNOSIS — IMO0002 Reserved for concepts with insufficient information to code with codable children: Secondary | ICD-10-CM

## 2015-03-21 DIAGNOSIS — E1159 Type 2 diabetes mellitus with other circulatory complications: Secondary | ICD-10-CM

## 2015-03-21 DIAGNOSIS — I63219 Cerebral infarction due to unspecified occlusion or stenosis of unspecified vertebral arteries: Secondary | ICD-10-CM | POA: Diagnosis not present

## 2015-03-21 DIAGNOSIS — R279 Unspecified lack of coordination: Secondary | ICD-10-CM

## 2015-03-21 DIAGNOSIS — R278 Other lack of coordination: Secondary | ICD-10-CM

## 2015-03-21 DIAGNOSIS — Z8673 Personal history of transient ischemic attack (TIA), and cerebral infarction without residual deficits: Secondary | ICD-10-CM

## 2015-03-21 DIAGNOSIS — R531 Weakness: Secondary | ICD-10-CM | POA: Diagnosis not present

## 2015-03-21 DIAGNOSIS — I69898 Other sequelae of other cerebrovascular disease: Secondary | ICD-10-CM | POA: Diagnosis not present

## 2015-03-21 HISTORY — DX: Type 2 diabetes mellitus without complications: E11.9

## 2015-03-21 NOTE — Patient Instructions (Addendum)
-   Continue ASA and plavix for stroke prevention - check BP at home, goal 120-150 - check glucose at home - Follow up with your primary care physician for stroke risk factor modification. Recommend maintain blood pressure goal <130/80, diabetes with hemoglobin A1c goal below 6.5% and lipids with LDL cholesterol goal below 70 mg/dL.  - follow up with Dr. Estanislado Pandy - continue PT/OT - follow up in 3 months.

## 2015-03-21 NOTE — Progress Notes (Signed)
STROKE NEUROLOGY FOLLOW UP NOTE  NAME: [name] DOB: 25-Jul-1930  REASON FOR VISIT: stroke follow up HISTORY FROM: pt and chart  Today we had the pleasure of seeing [name] in follow-up at our Neurology Clinic. Pt was accompanied by no one.   History Summary [name] is a 81 y.o. male with history of CAD, HTN, DM, HLD was admitted on 01/22/15 for 2 day history of right sided weakness. 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 CTA head and neck showed no thrombosis distal to left petrous ICA stenosis. Pt had no hypotension episodes but hypertension in 180s. Pt had left MCA stroke in 2013 and had dizziness episode in 2015. Combination of the fact of recurrent left MCA stroke and chronic HTN running in 180s, he underwent cerebral angio with angioplasty, no stent required. Repeat MRA post procedure showed stenosis improved to 50%. A1C 7.8 and LDL 61. He was discharged with dual antiplatelet and crestor.   Interval History During the interval time, the patient has been doing well. No recurrent stroke like symptoms. His BP today 155/78. His glucose at home about 100-140. He still has right hand and wrist weakness and he is continued on PT/OT. He was taking crestor 5mg  twice a week but was later discontinued by PCP. Otherwise he is doing well without complains. He has appointment with Dr. Estanislado Pandy to repeat angio.   REVIEW OF SYSTEMS: Full 14 system review of systems performed and notable only for those listed below and in HPI above, all others are negative:  Constitutional:   Cardiovascular:  Ear/Nose/Throat:   Skin:  Eyes:   Respiratory:   Gastroitestinal:   Genitourinary:  Hematology/Lymphatic:  Easily bruise Endocrine:  Musculoskeletal:  cramps Allergy/Immunology:   Neurological:  numbness Psychiatric:  Sleep:   The following represents the patient's updated allergies and side effects  list: Allergies  Allergen Reactions  . Atorvastatin     Myalgias  . Crestor [Rosuvastatin]   . Other Other (See Comments)    No  BLOOD PRODUCTS - Pt is Jeohovah's Witness  . Pravastatin     Myalgias  . Simvastatin     Myalgias     The neurologically relevant items on the patient's problem list were reviewed on today's visit.  Neurologic Examination  A problem focused neurological exam (12 or more points of the single system neurologic examination, vital signs counts as 1 point, cranial nerves count for 8 points) was performed.  Blood pressure 155/78, pulse 54, height 6\' 1"  (1.854 m), weight 219 lb 3.2 oz (99.428 kg).  General - Well nourished, well developed, in no apparent distress.  Ophthalmologic - Fundi not visualized due to eye movement.  Cardiovascular - Regular rate and rhythm.  Mental Status -  Level of arousal and orientation to time, place, and person were intact. Language including expression, naming, repetition, comprehension was assessed and found intact. Fund of Knowledge was assessed and was intact.  Cranial Nerves II - XII - II - Visual field intact OU. III, IV, VI - Extraocular movements intact. V - Facial sensation intact bilaterally. VII - Facial movement intact bilaterally. VIII - Hearing & vestibular intact bilaterally. X - Palate elevates symmetrically. XI - Chin turning & shoulder shrug intact bilaterally. XII - Tongue protrusion intact.  Motor Strength - The patient's strength was normal in all extremities except right wrist 3/5 extension and flexion, right hand 2/5 grip, and  pronator drift was absent.  Bulk was normal and fasciculations were absent.   Motor Tone - Muscle tone was assessed at the neck and appendages and was normal.  Reflexes - The patient's reflexes were 1+ in all extremities and he had no pathological reflexes.  Sensory - Light touch, temperature/pinprick were assessed and were normal.    Coordination - The patient had  normal movements in the hands and feet with no ataxia or dysmetria.  Tremor was absent.  Gait and Station - slow cautious gait but steady.  Data reviewed: I personally reviewed the images and agree with the radiology interpretations.  Dg Chest 2 View 01/22/2015 No active cardiopulmonary disease.   Ct Head Wo Contrast 01/22/2015 1. No acute intracranial pathology. 2. Chronic microvascular disease and cerebral atrophy.   Ct Angio Head & Neck W/cm &/or Wo/cm 01/22/2015 High-grade stenosis of the skullbase LEFT ICA at the junction of the horizontal petrous and inferior cavernous segments estimated 90%. No intraluminal filling defect to suggest thrombus. The MR finding of a possible filling defect in the LEFT ICA relates to turbulent flow. Chronically occluded RIGHT vertebral artery. Greater than 50% stenosis proximal LEFT ICA, not clearly flow reducing. This tandem lesion does further compromise flow to the LEFT anterior circulation.   Mri & Mra Head Wo Contrast 01/22/2015 Punctate foci of acute infarctions scattered throughout the anterior circulation distribution on the left consistent with micro embolic infarctions. Some of these could be watershed. Chronic small vessel ischemic changes elsewhere throughout the brain. Old left parietal cortical infarction. Severe focal stenosis of the left petrous carotid, 90% or greater. Possible filling defect distal to that which could be intraluminal thrombus or signal loss from severe turbulence. Stenoses in the anterior circulation intracranial branches, left worse than right. On the left, these could be atherosclerotic stenoses or conceivably due to nonocclusive embolic disease. Chronically occluded right vertebral.   2D echo - - Left ventricle: The cavity size was normal. There was moderate concentric hypertrophy. Systolic function was normal. The estimated ejection fraction was in the range of 60% to 65%. Wall motion was normal;  there were no regional wall motion abnormalities. Doppler parameters are consistent with abnormal left ventricular relaxation (grade 1 diastolic dysfunction). - Aortic valve: Trileaflet; normal thickness leaflets. - Aortic root: The aortic root was normal in size. - Left atrium: The atrium was mildly dilated. - Right ventricle: Systolic function was normal. - Right atrium: The atrium was normal in size. - Tricuspid valve: There was mild regurgitation. - Pulmonic valve: There was no regurgitation. - Pulmonary arteries: Systolic pressure was within the normal range. - Inferior vena cava: The vessel was normal in size. - Pericardium, extracardiac: There was no pericardial effusion.  Cerebral angio - S/P LT ICA intracranial angioplasty for severe symptomatic stenosis.with <20 % stenosis post angioplasty.  Component     Latest Ref Rng 06/27/2014 01/22/2015 01/23/2015 01/24/2015  Cholesterol     0 - 200 mg/dL  110    Triglycerides     <150 mg/dL  24    HDL Cholesterol     >40 mg/dL  44    Total CHOL/HDL Ratio       2.5    VLDL     0 - 40 mg/dL  5    LDL (calc)     0 - 99 mg/dL  61    Hemoglobin A1C     4.8 - 5.6 %  7.8 (H)    Mean Plasma Glucose  177    TSH     0.350 - 4.500 uIU/mL 2.651     HIV     Non Reactive   Non Reactive   Platelet Function  P2Y12     194 - 418 PRU    148 (L)    Assessment: As you may recall, he is a 81 y.o. Caucasian male with PMH of CAD, HTN, DM, HLD was admitted on 01/22/15 for left MCA/PCA and MCA/ACA watershed infarcts, and MRA showed left petrous portion ICA high grade stenosis 90%. CTA head and neck showed no thrombosis distal to left petrous ICA stenosis. Pt had left MCA stroke in 2013 and had dizziness episode in 2015. Combination of the fact of recurrent left MCA stroke and chronic HTN running in 180s, he underwent cerebral angio with angioplasty, no stent required. Repeat MRA post procedure showed stenosis improved to 50%. A1C 7.8 and LDL  61. He was discharged with dual antiplatelet and crestor. During the interval time, the patient has been doing well. No recurrent stroke like symptoms. He was taking crestor 5mg  twice a week but was later discontinued by PCP. He has appointment with Dr. Estanislado Pandy to repeat angio.   Plan:  - Continue ASA and plavix for stroke prevention - check BP at home, goal 120-150 - check glucose at home - Follow up with primary care physician for stroke risk factor modification. Recommend maintain blood pressure goal 120-150, diabetes with hemoglobin A1c goal below 6.5% and lipids with LDL cholesterol goal below 70 mg/dL.  - follow up with Dr. Estanislado Pandy - continue PT/OT - follow up in 3 months.  I spent more than 25 minutes of face to face time with the patient. Greater than 50% of time was spent in counseling and coordination of care.    No orders of the defined types were placed in this encounter.    Meds ordered this encounter  Medications  . DISCONTD: hydrALAZINE (APRESOLINE) 25 MG tablet    Sig:   . DISCONTD: hydrALAZINE (APRESOLINE) 50 MG tablet    Sig:     Patient Instructions  - Continue ASA and plavix for stroke prevention - check BP at home, goal 120-150 - check glucose at home - Follow up with your primary care physician for stroke risk factor modification. Recommend maintain blood pressure goal <130/80, diabetes with hemoglobin A1c goal below 6.5% and lipids with LDL cholesterol goal below 70 mg/dL.  - follow up with Dr. Estanislado Pandy - continue PT/OT - follow up in 3 months.   Rosalin Hawking, MD PhD [facility] [address] [place], [place] [address] [phone]

## 2015-03-21 NOTE — Patient Instructions (Signed)
AROM: Wrist Extension   .  With __right__ palm down, bend wrist up with light hammer 1 lbs Repeat __15__ times per set.  Do __1__ sessions per day.    AROM: Wrist Flexion

## 2015-03-22 NOTE — Therapy (Signed)
[facility] [address] [place], [place], [address] Phone: [phone]   Fax:  [phone]  Occupational Therapy Treatment  Patient Details  Name: [name] MRN: [id number] Date of Birth: [birth year] No Data Recorded  Encounter Date: 03/21/2015      OT End of Session - 03/22/15 1133    Visit Number 15   Number of Visits 17   Date for OT Re-Evaluation 03/31/15   Authorization Type Medicare   Authorization - Visit Number 15   Authorization - Number of Visits 20   OT Start Time 5427   OT Stop Time 1400   OT Time Calculation (min) 42 min   Activity Tolerance Patient tolerated treatment well   Behavior During Therapy [facility] for tasks assessed/performed      Past Medical History  Diagnosis Date  . Non-STEMI (non-ST elevated myocardial infarction) [facility]) July 2012    1. Ostial LAD 70% (to close to [facility] for PCI), subtotal occlusion of the RCA  . CAD in native artery     a. NSTEMI 11/2010 - CABG x2(LIMA to LAD, SVG to PDA-11/2010). b. NEGATIVE Lexiscan Myoview 6/9/'15, EF 53%, no perfusion abnormality, septal and apical HK noted. C. NSTEMI 05/2014 - s/p DES to SVG-RPDA 06/18/14 (Xience Alpine DES 3.0 x 18 mm -3.35 mm), EF 60-65%.  . 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 Servando Snare)  . Postoperative atrial fibrillation (HCC)     Post CABG, no sign recurrence  . HTN (hypertension)   . Type II diabetes mellitus with complication ([place])   . Hyperlipidemia LDL goal <70   . 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 Refusal of blood transfusions as patient is Jehovah's Witness   . Sleep apnea   . Abdominal aortic aneurysm ([place])     a. Aortic duplex 06/2014: mild aneurysmal dilatation of proximal abdominal aorta measuring 3.4x3.4cm. No sig change from 2012. F/u due 06/2016.  . Dizziness     a. Carotid duplex 03/2014: mild fibrous plaque, no significant stenosis.  . Stroke [facility])    a. 2014 with mild right hand weakness, nonhemorrhagic per pt.    Past Surgical History  Procedure Laterality Date  . Cardiac catheterization  12/11/2010    Dr. Chase Picket - subsequent cath - normal LV systolic function, no renal artery stenosis, severe 2-vessel disease with subtotaled RCA prox and distal 60% lesion and complex 70% area of narrowing of ostium of LAD  . Coronary artery bypass graft  12/15/2010    X2, Dr Servando Snare; LIMA to LAD, SVG to PDA;   Marland Kitchen Cystoscopy with urethral dilatation    . Transthoracic echocardiogram  07/28/2012    EF 55-60%, severe Conc LVH, Nl Systolic fxn, G1 DD,  Ao Sclerosis w/o stenosis; CRO PFO  . Left heart catheterization with coronary angiogram N/A 06/18/2014    Procedure: LEFT HEART CATHETERIZATION WITH CORONARY ANGIOGRAM;  Surgeon: Leonie Man, MD;  Location: [facility] CATH LAB;  Service: Cardiovascular;  Laterality: N/A;  . Radiology with anesthesia N/A 01/24/2015    Procedure: STENT ASSISTED ANGIOPLASTY (RADIOLOGY WITH ANESTHESIA);  Surgeon: Luanne Bras, MD;  Location: [place];  Service: Radiology;  Laterality: N/A;    There were no vitals filed for this visit.  Visit Diagnosis:  Weakness due to cerebrovascular accident  Impaired sensation  Decreased coordination      Subjective Assessment - 03/22/15 1128    Subjective  Pt agrees to plans to d/c today   Pertinent History see Epic   Patient Stated Goals use RUE   Currently in Pain? Yes   Pain Score 6   at night only intermittantly   Pain Location Hand   Pain Orientation Right   Pain Descriptors / Indicators Aching   Pain Type Acute pain   Pain Onset More than a month ago   Pain Frequency Intermittent   Aggravating Factors  overuse, nightime   Pain Relieving Factors heat      Fluidotherapy x 9 mins for stiffness, no adverse reactions. AA/ROM finger flexion followed by standing to place washers of various sizes on dowels, min v.c with RUE. Therapist added wrist strengthening exercise to  HEP. Pt returned demonstration. Therapsit checked progress towards goals and discussed importance of RUE functional use for items that are not sharp, hot, breakable with RUE. Pt agrees with d/c                        OT Education - 03/22/15 1134    Education provided Yes   Education Details wrist strengthening   Person(s) Educated Patient   Methods Explanation;Demonstration;Handout   Comprehension Verbalized understanding;Returned demonstration          OT Short Term Goals - 03/01/15 0952    OT SHORT TERM GOAL #1   Title I with inital HEP.   Status Achieved   OT SHORT TERM GOAL #2   Title I with RUE positioning to minimize pain and risk for injury, including splint wear, care PRN and verbalize understand ing of sensory precautions.   Status Achieved   OT SHORT TERM GOAL #3   Title Pt will demonstrate 120 shoulder flexion with RUE with pain less than or equal to 3/10 for improved functional reach.   Status Achieved   OT SHORT TERM GOAL #4   Title Pt will demonstrate 50 % composite finger flexion with RUE in prep for functional grasp.   Status Achieved   OT SHORT TERM GOAL #5   Title Pt will report ability to use RUE as a stabilizer/ gross A at least 25 % of the time for ADLs/ IADLs.   Status Achieved   OT SHORT TERM GOAL #6   Title Pt will verbzalize understanding of compensatory strategies for short term memory deficits.   Status Achieved           OT Long Term Goals - 03/22/15 1133    OT LONG TERM GOAL #1   Title Pt will demonstrate improved RUE functional use as eveidenced by performing 30 blocks on box/ blocks test.   Status Achieved   OT LONG TERM GOAL #2   Title Pt will demonstrate wrist extension WFLS for ADLs.   Status Achieved   OT LONG TERM GOAL #3   Title Pt will use RUE as a gross A / stabilizer at least 50 % of the time for ADLS/ IADLs with pain less than or equal  to 3/10.   Status Achieved   OT LONG TERM GOAL #4   Title Pt will  perform light home management/ cooking at a modified independent level demonstrating good safety awareness.   Status Achieved               Plan - 03/22/15 1130    Clinical Impression Statement Pt is progresing towards goals. Pt requests to d/c today.   Pt will benefit from skilled therapeutic intervention in order to improve  on the following deficits (Retired) Abnormal gait;Decreased coordination;Decreased range of motion;Impaired sensation;Increased edema;Decreased safety awareness;Decreased endurance;Decreased activity tolerance;Pain;Impaired UE functional use;Decreased knowledge of use of DME;Decreased balance;Decreased cognition;Decreased mobility;Decreased strength   Rehab Potential Good   Clinical Impairments Affecting Rehab Potential sensory impairment   OT Frequency 2x / week   OT Duration 8 weeks   OT Treatment/Interventions Self-care/ADL training;Therapeutic exercise;Functional Mobility Training;Patient/family education;Splinting;Balance training;Manual Therapy;Neuromuscular education;Ultrasound;Therapeutic exercises;Therapeutic activities;DME and/or AE instruction;Parrafin;Cryotherapy;Electrical Stimulation;Fluidtherapy;Scar mobilization;Cognitive remediation/compensation;Passive range of motion;Contrast Bath;Moist Heat   Plan d/c OT   OT Home Exercise Plan issued inital HEP 9/22   Consulted and Agree with Plan of Care Patient          G-Codes - April 15, 2015 1630   Functional Assessment Tool Used Box blocks RUE 30 blocks, Pt uses RUE at least 50% of the time functionally   Functional Limitation Carrying, moving and handling objects   Carrying, Moving and Handling Objects Goal Status (W0981) At least 20 percent but less than 40 percent impaired, limited or restricted   Carrying, Moving and Handling Objects Discharge Status [phone]) At least 20 percent but less than 40 percent impaired, limited or restricted      Problem List Patient Active Problem List   Diagnosis Date  Noted  . History of stroke Apr 15, 2015  . Type 2 diabetes mellitus with circulatory disorder ([place]) 15-Apr-2015  . Stenosis of carotid artery   . Essential hypertension   . HLD (hyperlipidemia)   . CVA (cerebral infarction) 01/22/2015  . CAD S/P percutaneous coronary angioplasty 09/19/2014  . Coronary artery disease involving coronary bypass graft of native heart with other forms of angina pectoris ([place]) 09/19/2014  . Shortness of breath 09/19/2014  . Polypharmacy 09/19/2014  . Abdominal aortic aneurysm ([place])   . Stroke ([place])   . Hyperlipidemia LDL goal <70   . Type II diabetes mellitus with complication ([place])   . Coronary artery disease involving native coronary artery   . Abnormal TSH 06/19/2014  . Obesity 06/19/2014  . Bilateral lower extremity edema 03/30/2014  . Dizziness 02/21/2014  . Bradycardia 08/31/2013  . Postoperative atrial fibrillation (HCC)     Class: Temporary  . Diabetes mellitus type 2 in obese ([place])   . Moderate essential hypertension   . Apnea, sleep   . Balanitis xerotica obliterans   . History of: Non-STEMI (non-ST elevated myocardial infarction) 11/16/2010    Class: History of  . S/P CABG x 2 11/16/2010   OCCUPATIONAL THERAPY DISCHARGE SUMMARY    Current functional level related to goals / functional outcomes: Pt demonstrates excellent progress, he achieved all goals.   Remaining deficits: Decreased strength, decreased coordination, pain   Education / Equipment: Pt was educated regarding HEP and ways to use RUE safely and functionally at home., as well as precautions related to sensory impairment. Pt verbalized understanding of all education.  Plan: Patient agrees to discharge.  Patient goals were met. Patient is being discharged due to the patient's request.  ?????     Thaine Garriga 03/22/2015, 11:37 AM Theone Murdoch, OTR/L [phone] Phone: [phone] 11:43 AM 11/04/2016[facility] [address] [place], [place], [address] Phone: [phone]   Fax:  [phone]  Name: [name] MRN: [id number] Date of Birth: 1930-09-28

## 2015-03-26 ENCOUNTER — Encounter: Payer: PRIVATE HEALTH INSURANCE | Admitting: Occupational Therapy

## 2015-03-29 ENCOUNTER — Encounter: Payer: PRIVATE HEALTH INSURANCE | Admitting: Occupational Therapy

## 2015-04-03 ENCOUNTER — Telehealth (HOSPITAL_COMMUNITY): Payer: Self-pay | Admitting: *Deleted

## 2015-04-03 NOTE — Telephone Encounter (Signed)
Called [name] regarding lab results, per Dr. Estanislado Pandy direction instructed patient to take Plavix 37.5mg  and ASA 325mg  daily.  Patient repeated directions to take 1/2 tablet(37.5mg ) of plavix daily and continue his ASA dose daily.  He will come Monday or Tuesday of next week for repeat P2Y12.

## 2015-04-08 ENCOUNTER — Other Ambulatory Visit: Payer: Self-pay | Admitting: Radiology

## 2015-04-08 DIAGNOSIS — R531 Weakness: Secondary | ICD-10-CM | POA: Diagnosis not present

## 2015-04-08 DIAGNOSIS — R201 Hypoesthesia of skin: Secondary | ICD-10-CM | POA: Diagnosis not present

## 2015-04-08 DIAGNOSIS — I6529 Occlusion and stenosis of unspecified carotid artery: Secondary | ICD-10-CM

## 2015-04-08 DIAGNOSIS — I69898 Other sequelae of other cerebrovascular disease: Secondary | ICD-10-CM | POA: Diagnosis not present

## 2015-04-08 DIAGNOSIS — R279 Unspecified lack of coordination: Secondary | ICD-10-CM | POA: Diagnosis not present

## 2015-04-08 LAB — PLATELET INHIBITION P2Y12: PLATELET FUNCTION P2Y12: 17 [PRU] — AB (ref 194–418)

## 2015-04-10 ENCOUNTER — Telehealth (HOSPITAL_COMMUNITY): Payer: Self-pay | Admitting: *Deleted

## 2015-04-10 NOTE — Telephone Encounter (Signed)
Called and spoke to patients wife.  Per Dr. Estanislado Pandy patient is to take Plavix 37.5mg  every other day and ASA 325mg  daily.  Pt's wife repeated instructions to me.  Pt to come the end of next week for repeat P2Y12.

## 2015-04-19 ENCOUNTER — Other Ambulatory Visit: Payer: Self-pay | Admitting: Radiology

## 2015-04-19 DIAGNOSIS — I6350 Cerebral infarction due to unspecified occlusion or stenosis of unspecified cerebral artery: Secondary | ICD-10-CM

## 2015-04-19 LAB — PLATELET INHIBITION P2Y12: Platelet Function  P2Y12: 83 [PRU] — ABNORMAL LOW (ref 194–418)

## 2015-04-22 ENCOUNTER — Telehealth (HOSPITAL_COMMUNITY): Payer: Self-pay | Admitting: *Deleted

## 2015-04-22 NOTE — Telephone Encounter (Signed)
Called and spoke to [employer].  Per Dr. Estanislado Pandy pt is to continue current dose of Plavix and ASA.  Instructed that he will need to come back for labs if he has symptom changes.  Wife verbalized instructions back to me. Plavix 37.5mg  every other day and ASA 325mg 

## 2015-05-08 DIAGNOSIS — L570 Actinic keratosis: Secondary | ICD-10-CM | POA: Diagnosis not present

## 2015-05-08 DIAGNOSIS — Z85828 Personal history of other malignant neoplasm of skin: Secondary | ICD-10-CM | POA: Diagnosis not present

## 2015-05-08 DIAGNOSIS — D1801 Hemangioma of skin and subcutaneous tissue: Secondary | ICD-10-CM | POA: Diagnosis not present

## 2015-05-08 DIAGNOSIS — L821 Other seborrheic keratosis: Secondary | ICD-10-CM | POA: Diagnosis not present

## 2015-05-21 ENCOUNTER — Other Ambulatory Visit (HOSPITAL_COMMUNITY): Payer: Self-pay | Admitting: Interventional Radiology

## 2015-05-21 DIAGNOSIS — I771 Stricture of artery: Secondary | ICD-10-CM

## 2015-05-30 ENCOUNTER — Ambulatory Visit (HOSPITAL_COMMUNITY)
Admission: RE | Admit: 2015-05-30 | Discharge: 2015-05-30 | Disposition: A | Discharge status: Home / Self Care | Payer: Medicare Other | Source: Ambulatory Visit | Attending: Interventional Radiology | Admitting: Interventional Radiology

## 2015-05-30 ENCOUNTER — Ambulatory Visit (HOSPITAL_COMMUNITY): Payer: Medicare Other

## 2015-05-30 DIAGNOSIS — I6522 Occlusion and stenosis of left carotid artery: Secondary | ICD-10-CM | POA: Insufficient documentation

## 2015-05-30 DIAGNOSIS — I6619 Occlusion and stenosis of unspecified anterior cerebral artery: Secondary | ICD-10-CM | POA: Insufficient documentation

## 2015-05-30 DIAGNOSIS — I651 Occlusion and stenosis of basilar artery: Secondary | ICD-10-CM | POA: Diagnosis not present

## 2015-05-30 DIAGNOSIS — I6603 Occlusion and stenosis of bilateral middle cerebral arteries: Secondary | ICD-10-CM | POA: Diagnosis not present

## 2015-05-30 DIAGNOSIS — I771 Stricture of artery: Secondary | ICD-10-CM

## 2015-05-30 LAB — CREATININE, SERUM
CREATININE: 1.17 mg/dL (ref 0.61–1.24)
GFR calc Af Amer: >60 mL/min (ref >60)
GFR, EST NON AFRICAN AMERICAN: 56 mL/min — AB (ref >60)

## 2015-05-30 MED ORDER — GADOBENATE DIMEGLUMINE 529 MG/ML IV SOLN
20.0000 mL | Freq: Once | INTRAVENOUS | Status: AC | PRN
  Administered 2015-05-30: 20 mL via INTRAVENOUS

## 2015-06-04 ENCOUNTER — Telehealth (HOSPITAL_COMMUNITY): Payer: Self-pay

## 2015-06-04 NOTE — Telephone Encounter (Signed)
Spoke with pt's wife. Informed her that Dr. Estanislado Pandy wants pt to follow up with another MRI in 6 months. Pt's wife agreed with this plan. AW

## 2015-06-17 ENCOUNTER — Ambulatory Visit (INDEPENDENT_AMBULATORY_CARE_PROVIDER_SITE_OTHER): Payer: Medicare Other | Admitting: Cardiology

## 2015-06-17 ENCOUNTER — Encounter: Payer: Self-pay | Admitting: Cardiology

## 2015-06-17 VITALS — BP 132/62 | HR 56 | Ht 73.0 in | Wt 227.0 lb

## 2015-06-17 DIAGNOSIS — I10 Essential (primary) hypertension: Secondary | ICD-10-CM

## 2015-06-17 DIAGNOSIS — I63032 Cerebral infarction due to thrombosis of left carotid artery: Secondary | ICD-10-CM

## 2015-06-17 DIAGNOSIS — I2510 Atherosclerotic heart disease of native coronary artery without angina pectoris: Secondary | ICD-10-CM

## 2015-06-17 DIAGNOSIS — E785 Hyperlipidemia, unspecified: Secondary | ICD-10-CM

## 2015-06-17 DIAGNOSIS — I25708 Atherosclerosis of coronary artery bypass graft(s), unspecified, with other forms of angina pectoris: Secondary | ICD-10-CM | POA: Diagnosis not present

## 2015-06-17 DIAGNOSIS — I214 Non-ST elevation (NSTEMI) myocardial infarction: Secondary | ICD-10-CM | POA: Diagnosis not present

## 2015-06-17 DIAGNOSIS — Z9861 Coronary angioplasty status: Secondary | ICD-10-CM

## 2015-06-17 NOTE — Progress Notes (Signed)
PCP: [name] Sake, MD  Clinic Note: Chief Complaint  Patient presents with  . Follow-up    no chest pain, no shortness of breath, occ swelling, no cramping, occ lightheadedness  . Cerebrovascular Accident  . Coronary Artery Disease    History of CABG followed by PCI    HPI: [name] is a 82 y.o. male with a PMH below who presents today for ~6 month f/u of CAD-CABG-PCI & CRFs.[name] was last seen on Dec 19, 2014 - doing well NSTEMI in ~Jan-Feb 2016.   Recent Hospitalizations: September 6-09/02/2015 - admitted for CVA  Sept 2016 - 2 day h/o R sided weakness --> MRI showed left MCA/PCA and MCA/ACA watershed infarcts, and MRA showed left petrous portion ICA high grade stenosis 90%. CTA head and neck showed no thrombosis distal to left petrous ICA stenosis.  - Unable to use hand -- residual weakness remains essentially decreased sensation and inability to move/manipulate his right hand.  Cerebral angiogram - 01/23/15 -- 9/8 --> Angioplasty - stent 1.>95 % stenosis Lt ICA petrous segment with post stenotic dilatation. 2.Approx 60% stenosis LT ICA supraclinoid segment. 3.approx 50 to 70 stnosis of RT ACA A1 segment. 4.Occluded RT VA  Studies Reviewed:    Echo 01/23/2015: LV cavity size was normal. Moderate concentric LVH.  EF 60-65% (normal). No RWMA  GR 1 DD - abnormal relaxation.  Mild LA dilation. Normal PA pressures.   MRI: 01/22/2015: Punctate foci of acute infarction scattered throughout the anterior circulation on the left = consistent with microemboli and possible watershed. Chronic small vessel ischemic changes elsewhere. Old left parietal cortical infarction. Severe focal stenosis of the left petrous carotid (90% or greater) possible filling defect distal to that could be intraluminal thrombus or signal loss. Is in the anterior circulation intracranial branches left > right. Chronically occluded right vertebral artery.  Follow-up MRI on 01/26/2015:  Marked improvement of left ICA occlusion in the petrous segment. The 50% irregularity. Stable to [id number] paraclinoid LICA with stable moderate stenosis of left MCA bifurcation. High-grade stenosis of distal right P2 segment of right posterior cerebral artery (possible fetal type)  Apparently Crestor was stopped by PCP  Interval History: Despite having to deal with his stroke, [name] presents really without significant symptoms from cardiac standpoint. I am following his stenting of his carotid artery, he was noted to have hypersensitivity to Plavix, therefore he is dose was eventually reduced to one half tablet every other day along with full dose aspirin. He is not had any bleeding issues. Just does note that he has a hard time stopping the bleeding when he cuts himself. He doesn't really have use of his right hand, but he stills try to stay active. He enjoys splitting wood which he does using his left hand. This is a good for exercise for him that he does depending on her energy has. He also tries to walk around some. He notes occasional palpitations but nothing prolonged. Usually he just occasionally feels an occasional skipped beat but sometimes it can last a minute or 2. He has some mild edema but no PND or orthopnea.  No chest pain or shortness of breath with rest or exertion.  No PND, orthopnea with mild dependent edema.  No recurrent symptoms of dizziness, weakness, syncope/near syncope, or TIA/amaurosis fugax symptoms. No melena, hematochezia, hematuria, or epstaxis. No claudication.  ROS: A comprehensive was performed. Review of Systems  Constitutional: Negative for malaise/fatigue.  Respiratory: Negative for cough and shortness  of breath.   Cardiovascular: Negative for claudication.  Gastrointestinal: Positive for heartburn. Negative for constipation.  Musculoskeletal: Positive for back pain and joint pain (bilateral, but right greater than left knee). Negative for myalgias and falls.    Neurological: Positive for focal weakness (residual right hand plegia from stroke). Negative for weakness.  Endo/Heme/Allergies: Negative.   Psychiatric/Behavioral: Negative.        He is actually dealing with his current condition after stroke pretty well.  All other systems reviewed and are negative.   Past Medical History  Diagnosis Date  . Non-STEMI (non-ST elevated myocardial infarction) [facility]) July 2012    1. Ostial LAD 70% (to close to [facility] for PCI), subtotal occlusion of the RCA  . CAD in native artery     a. NSTEMI 11/2010 - CABG x2(LIMA to LAD, SVG to PDA-11/2010). b. NEGATIVE Lexiscan Myoview 6/9/'15, EF 53%, no perfusion abnormality, septal and apical HK noted. C. NSTEMI 05/2014 - s/p DES to SVG-RPDA 06/18/14 (Xience Alpine DES 3.0 x 18 mm -3.35 mm), EF 60-65%.  . 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 Servando Snare)  . Postoperative atrial fibrillation (HCC)     Post CABG, no sign recurrence  . HTN (hypertension)   . Type II diabetes mellitus with complication ([place])   . Hyperlipidemia LDL goal <70   . 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 Refusal of blood transfusions as patient is Jehovah's Witness   . Sleep apnea     Not on CPAP.  Marland Kitchen Abdominal aortic aneurysm ([place])     a. Aortic duplex 06/2014: mild aneurysmal dilatation of proximal abdominal aorta measuring 3.4x3.4cm. No sig change from 2012. F/u due 06/2016.  . Dizziness     a. Carotid duplex 03/2014: mild fibrous plaque, no significant stenosis.  . Stroke [facility]) 2014; 01/2015    a. 2014 with mild right hand weakness, nonhemorrhagic per pt.;; b. - PTA-Stent L ICA 95%     Past Surgical History  Procedure Laterality Date  . Cardiac catheterization  12/11/2010    Dr. Chase Picket - subsequent cath - normal LV systolic function, no renal artery stenosis, severe 2-vessel disease with subtotaled RCA prox and distal 60% lesion and complex 70% area of narrowing of ostium of LAD  .  Coronary artery bypass graft  12/15/2010    X2, Dr Servando Snare; LIMA to LAD, SVG to PDA;   Marland Kitchen Cystoscopy with urethral dilatation    . Transthoracic echocardiogram  07/28/2012; 03/25/2015:    a. EF 55-60%, severe Conc LVH, Nl Systolic fxn, G1 DD,  Ao Sclerosis w/o stenosis; CRO PFO;; b. Moderate concentric LVH. EF 60-65%. No RWMA, Gr 1 DD. Mild LA dilation  . Left heart catheterization with coronary angiogram N/A 06/18/2014    Procedure: LEFT HEART CATHETERIZATION WITH CORONARY ANGIOGRAM;  Surgeon: Leonie Man, MD;  Location: [facility] CATH LAB;  Service: Cardiovascular;  -- severe disease of SVG-rPDA  . Radiology with anesthesia N/A 01/24/2015    Procedure: STENT ASSISTED ANGIOPLASTY (RADIOLOGY WITH ANESTHESIA);  Surgeon: Luanne Bras, MD;  Location: [place];  Service: Radiology;  Laterality: N/A;  . Coronary angioplasty with stent placement  06/18/2014    PCI to SVG-RPDA 06/18/14 (Xience Alpine DES 3.0 x 18 mm -3.35 mm),    Prior to Admission medications   Medication Sig Start Date End Date Taking? Authorizing Provider  amLODipine (NORVASC) 10 MG tablet Take 10 mg by mouth every  evening.    Yes Historical Provider, MD  aspirin 325 MG tablet Take 1 tablet (325 mg total) by mouth daily with breakfast. 01/26/15  Yes Rosalin Hawking, MD  clopidogrel (PLAVIX) 75 MG tablet Take 1 tablet (75 mg total) by mouth daily. -- 1/2 tab qod 12/19/14  Yes Leonie Man, MD  docusate sodium (COLACE) 100 MG capsule Take 1 capsule (100 mg total) by mouth 2 (two) times daily. 06/27/14  Yes Dayna N Dunn, PA-C  glimepiride (AMARYL) 4 MG tablet Take 4 mg by mouth 2 (two) times daily with a meal.    Yes Historical Provider, MD  labetalol (NORMODYNE) 200 MG tablet Take 200 mg by mouth 2 (two) times daily. 1/2 tablet twice a day   Yes Historical Provider, MD  losartan-hydrochlorothiazide (HYZAAR) 100-25 MG per tablet Take 1 tablet by mouth every morning.    Yes Historical Provider, MD  metFORMIN (GLUCOPHAGE) 1000 MG tablet Take 1 tablet by  mouth 2 (two) times daily. 07/25/14  Yes Historical Provider, MD  nitroGLYCERIN (NITROSTAT) 0.4 MG SL tablet Place 1 tablet (0.4 mg total) under the tongue every 5 (five) minutes as needed for chest pain (up to 3 doses). 06/19/14  Yes Dayna N Dunn, PA-C   Allergies  Allergen Reactions  . Atorvastatin     Myalgias  . Crestor [Rosuvastatin]   . Other Other (See Comments)    No  BLOOD PRODUCTS - Pt is Jeohovah's Witness  . Pravastatin     Myalgias  . Simvastatin     Myalgias     Social History   Social History  . Marital Status: Married    Spouse Name: [name]  . Number of Children: 1  . Years of Education: N/A   Occupational History  .     Social History Main Topics  . Smoking status: Former Smoker    Types: Cigarettes    Quit date: 05/27/1957  . Smokeless tobacco: Never Used  . Alcohol Use: No  . Drug Use: No  . Sexual Activity: Not Asked   Other Topics Concern  . None   Social History Narrative   Married father of one.  Previously uses stationary bike routinely.  Now reduced due to other social stressors.   Quit smoking 50 years ago.  Does not drink alcohol   Family History  Problem Relation Age of Onset  . Heart Problems Father 42     Wt Readings from Last 3 Encounters:  06/17/15 227 lb (102.967 kg)  03/21/15 219 lb 3.2 oz (99.428 kg)  01/22/15 222 lb 0.1 oz (100.7 kg)    PHYSICAL EXAM BP 132/62 mmHg  Pulse 56  Ht 6\' 1"  (1.854 m)  Wt 227 lb (102.967 kg)  BMI 29.96 kg/m2 General appearance: alert, cooperative, appears stated age, no distress and mildly obese HEENT: [place]/AT, EOMI, MMM, anicteric sclera Neck: no adenopathy, no carotid bruit and no JVD Lungs: clear to auscultation bilaterally, normal percussion bilaterally and non-labored Heart: regular rate and rhythm, S1, S2 normal, soft 1/6 SEM at RUSB radiating to carotids, click, rub or gallop ; nondisplaced PMI Abdomen: soft, non-tender; bowel sounds normal; no masses, no organomegaly;  Extremities:  extremities normal, atraumatic, no cyanosis, and ~2+ bilateral LE edema Pulses: 2+ and symmetric;  Neurologic: Mental status: Alert, oriented, thought content appropriate; Notable plegia & decreased sensation of R hand from wrist.  CN II-XII grossly intact.    Adult ECG Report - n/a  Other studies Reviewed: Additional studies/ records that were reviewed  today include:  Recent Labs:   Lab Results  Component Value Date   CHOL 110 01/22/2015   HDL 44 01/22/2015   LDLCALC 61 01/22/2015   TRIG 24 01/22/2015   CHOLHDL 2.5 01/22/2015    ASSESSMENT / PLAN: Problem List Items Addressed This Visit    Stroke ([place]) - Primary (Chronic)    I think probably of the best for her to get this far as recovering from a stroke. Based on platelet reactivity, he is on half dose Plavix every other day and high-dose aspirin. I wonder if he had the left carotid disease at the time of his original stroke      Relevant Medications   labetalol (NORMODYNE) 200 MG tablet   Hyperlipidemia LDL goal <70 (Chronic)    Again and not sure why his Crestor was stopped. Thankfully, in September his lipids look well-controlled. We can recheck again after next visit.      Relevant Medications   labetalol (NORMODYNE) 200 MG tablet   History of: Non-STEMI (non-ST elevated myocardial infarction) (Chronic)    No further anginal type symptoms since last February when he had PCI. Last MRI was in January 2016 when he had PCI to the SVG-RPDA. No real heart failure symptoms. Echo looked relatively normal.      Relevant Medications   labetalol (NORMODYNE) 200 MG tablet   Essential hypertension    Well-controlled blood pressure today -- again on labetalol and ARB along with hydralazine.      Relevant Medications   labetalol (NORMODYNE) 200 MG tablet   Coronary artery disease involving coronary bypass graft of native heart with other forms of angina pectoris (HCC) (Chronic)    With early graft disease & him not being  very active - will f/u Myoview/Cardiolite ST next year.      Relevant Medications   labetalol (NORMODYNE) 200 MG tablet   CAD S/P percutaneous coronary angioplasty (Chronic)    Interestingly, his Plavix was stopped at the time his stroke and now is being reduced to half a dose every other day. Based on his platelet reactivity, and is probably appropriate. He is now a year from last PCI, therefore less concerning. He still remains on labetalol which is advancing choice, I would prefer to switch to Crestor, but as he is stable we'll continue current dose. Is also on ARB plus hydralazine. No longer on statin for unclear reason - he did ok with Crestor in the past.      Relevant Medications   labetalol (NORMODYNE) 200 MG tablet      Current medicines are reviewed at length with the patient today. (+/- concerns) none The following changes have been made: none   Studies Ordered:   No orders of the defined types were placed in this encounter.    ROV - 6 months   HARDING, Leonie Green, M.D., M.S. Interventional Cardiologist   Pager # [phone] Phone # [phone] [address] [place], [place] [address]

## 2015-06-17 NOTE — Patient Instructions (Signed)
NO CHANGE IN CURRENT MEDICATIONS   Your physician wants you to follow-up in 6 MONTHS WITH DR HARDING. -30 MIN  You will receive a reminder letter in the mail two months in advance. If you don't receive a letter, please call our office to schedule the follow-up appointment.  If you need a refill on your cardiac medications before your next appointment, please call your pharmacy.   

## 2015-06-18 ENCOUNTER — Encounter: Payer: Self-pay | Admitting: Cardiology

## 2015-06-18 NOTE — Assessment & Plan Note (Signed)
With early graft disease & him not being very active - will f/u Myoview/Cardiolite ST next year.

## 2015-06-18 NOTE — Assessment & Plan Note (Signed)
Well-controlled blood pressure today -- again on labetalol and ARB along with hydralazine.

## 2015-06-18 NOTE — Assessment & Plan Note (Signed)
Again and not sure why his Crestor was stopped. Thankfully, in September his lipids look well-controlled. We can recheck again after next visit.

## 2015-06-18 NOTE — Assessment & Plan Note (Signed)
No further anginal type symptoms since last February when he had PCI. Last MRI was in January 2016 when he had PCI to the SVG-RPDA. No real heart failure symptoms. Echo looked relatively normal.

## 2015-06-18 NOTE — Assessment & Plan Note (Signed)
I think probably of the best for her to get this far as recovering from a stroke. Based on platelet reactivity, he is on half dose Plavix every other day and high-dose aspirin. I wonder if he had the left carotid disease at the time of his original stroke

## 2015-06-18 NOTE — Assessment & Plan Note (Signed)
Interestingly, his Plavix was stopped at the time his stroke and now is being reduced to half a dose every other day. Based on his platelet reactivity, and is probably appropriate. He is now a year from last PCI, therefore less concerning. He still remains on labetalol which is advancing choice, I would prefer to switch to Crestor, but as he is stable we'll continue current dose. Is also on ARB plus hydralazine. No longer on statin for unclear reason - he did ok with Crestor in the past.

## 2015-06-24 ENCOUNTER — Encounter: Payer: Self-pay | Admitting: Neurology

## 2015-06-24 ENCOUNTER — Ambulatory Visit (INDEPENDENT_AMBULATORY_CARE_PROVIDER_SITE_OTHER): Payer: Medicare Other | Admitting: Neurology

## 2015-06-24 VITALS — BP 133/72 | HR 55 | Ht 73.0 in | Wt 227.4 lb

## 2015-06-24 DIAGNOSIS — E1159 Type 2 diabetes mellitus with other circulatory complications: Secondary | ICD-10-CM

## 2015-06-24 DIAGNOSIS — I10 Essential (primary) hypertension: Secondary | ICD-10-CM

## 2015-06-24 DIAGNOSIS — I2510 Atherosclerotic heart disease of native coronary artery without angina pectoris: Secondary | ICD-10-CM

## 2015-06-24 DIAGNOSIS — I63232 Cerebral infarction due to unspecified occlusion or stenosis of left carotid arteries: Secondary | ICD-10-CM | POA: Diagnosis not present

## 2015-06-24 DIAGNOSIS — Z9861 Coronary angioplasty status: Secondary | ICD-10-CM

## 2015-06-24 DIAGNOSIS — G5601 Carpal tunnel syndrome, right upper limb: Secondary | ICD-10-CM | POA: Diagnosis not present

## 2015-06-24 DIAGNOSIS — Z8673 Personal history of transient ischemic attack (TIA), and cerebral infarction without residual deficits: Secondary | ICD-10-CM | POA: Diagnosis not present

## 2015-06-24 DIAGNOSIS — I6522 Occlusion and stenosis of left carotid artery: Secondary | ICD-10-CM | POA: Diagnosis not present

## 2015-06-24 HISTORY — DX: Carpal tunnel syndrome, right upper limb: G56.01

## 2015-06-24 NOTE — Progress Notes (Signed)
STROKE NEUROLOGY FOLLOW UP NOTE  NAME: [name] DOB: 07-Aug-1930  REASON FOR VISIT: stroke follow up HISTORY FROM: pt and chart  Today we had the pleasure of seeing [name] in follow-up at our [facility]rology [facility]. Pt was accompanied by no one.   History Summary [name] is a 82 y.o. male left handed with history of CAD, HTN, DM, HLD was admitted on 01/22/15 for 2 day history of right sided weakness. 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 CTA head and neck showed no thrombosis distal to left petrous ICA stenosis. Pt had no hypotension episodes but hypertension in 180s. Pt had left MCA stroke in 2013 and had dizziness episode in 2015. Combination of the fact of recurrent left MCA stroke and chronic HTN running in 180s, he underwent cerebral angio with angioplasty, no stent required. Repeat MRA post procedure showed stenosis improved to 50%. A1C 7.8 and LDL 61. He was discharged with dual antiplatelet and crestor.   03/21/15 follow up - the patient has been doing well. No recurrent stroke like symptoms. His BP today 155/78. His glucose at home about 100-140. He still has right hand and wrist weakness and he is continued on PT/OT. He was taking crestor 5mg  twice a week but was later discontinued by PCP. Otherwise he is doing well without complains. He has appointment with Dr. Estanislado Pandy to repeat angio.  Interval History During the interval time, pt has been doing well, no stroke like symptom. He had follow up with Dr. Estanislado Pandy in 05/2014, and had repeat MRA showed stable 50% residue stenosis of the left petrous ICA, and stable 50% left supraclinoid ICA stenosis. P2Y12 has not been checked yet. Right wrist extension and flexion much improved, still has difficulty of right hand. He complains of right hand pain intermittently, relieved by hand brace. BP 133/72.  REVIEW OF SYSTEMS: Full 14 system review of  systems performed and notable only for those listed below and in HPI above, all others are negative:  Constitutional:   Cardiovascular:  Ear/Nose/Throat:   Skin:  Eyes:   Respiratory:   Gastroitestinal:   Genitourinary:  Hematology/Lymphatic:  Easily bruise Endocrine:  Musculoskeletal:  cramps Allergy/Immunology:   Neurological:  numbness Psychiatric:  Sleep:   The following represents the patient's updated allergies and side effects list: Allergies  Allergen Reactions  . Atorvastatin     Myalgias  . Crestor [Rosuvastatin]   . Other Other (See Comments)    No  BLOOD PRODUCTS - Pt is [employer]  . Pravastatin     Myalgias  . Simvastatin     Myalgias     The neurologically relevant items on the patient's problem list were reviewed on today's visit.  Neurologic Examination  A problem focused neurological exam (12 or more points of the single system neurologic examination, vital signs counts as 1 point, cranial nerves count for 8 points) was performed.  Blood pressure 133/72, pulse 55, height 6\' 1"  (1.854 m), weight 227 lb 6.4 oz (103.148 kg).  General - Well nourished, well developed, in no apparent distress.  Ophthalmologic - Fundi not visualized due to eye movement.  Cardiovascular - Regular rate and rhythm.  Mental Status -  Level of arousal and orientation to time, place, and person were intact. Language including expression, naming, repetition, comprehension was assessed and found intact. Fund of Knowledge was assessed and was intact.  Cranial Nerves II -  XII - II - Visual field intact OU. III, IV, VI - Extraocular movements intact. V - Facial sensation intact bilaterally. VII - Facial movement intact bilaterally. VIII - Hearing & vestibular intact bilaterally. X - Palate elevates symmetrically. XI - Chin turning & shoulder shrug intact bilaterally. XII - Tongue protrusion intact.  Motor Strength - The patient's strength was normal in all  extremities except right wrist 5-/5 extension and 4/5  flexion, right hand 3-/5 grip, and pronator drift was absent.  Bulk was normal and fasciculations were absent.   Motor Tone - Muscle tone was assessed at the neck and appendages and was normal.  Reflexes - The patient's reflexes were 1+ in all extremities and he had no pathological reflexes.  Sensory - Light touch, temperature/pinprick were assessed and were decreased at right hand, 50% of left hand.    Coordination - The patient had normal movements in the hands and feet with no ataxia or dysmetria.  Tremor was absent.  Gait and Station - slow cautious gait but steady.  Data reviewed: I personally reviewed the images and agree with the radiology interpretations.  Dg Chest 2 View 01/22/2015 No active cardiopulmonary disease.   Ct Head Wo Contrast 01/22/2015 1. No acute intracranial pathology. 2. Chronic microvascular disease and cerebral atrophy.   Ct Angio Head & Neck W/cm &/or Wo/cm 01/22/2015 High-grade stenosis of the skullbase LEFT ICA at the junction of the horizontal petrous and inferior cavernous segments estimated 90%. No intraluminal filling defect to suggest thrombus. The MR finding of a possible filling defect in the LEFT ICA relates to turbulent flow. Chronically occluded RIGHT vertebral artery. Greater than 50% stenosis proximal LEFT ICA, not clearly flow reducing. This tandem lesion does further compromise flow to the LEFT anterior circulation.   Mri & Mra Head Wo Contrast 01/22/2015 Punctate foci of acute infarctions scattered throughout the anterior circulation distribution on the left consistent with micro embolic infarctions. Some of these could be watershed. Chronic small vessel ischemic changes elsewhere throughout the brain. Old left parietal cortical infarction. Severe focal stenosis of the left petrous carotid, 90% or greater. Possible filling defect distal to that which could be intraluminal thrombus  or signal loss from severe turbulence. Stenoses in the anterior circulation intracranial branches, left worse than right. On the left, these could be atherosclerotic stenoses or conceivably due to nonocclusive embolic disease. Chronically occluded right vertebral.   05/30/15 1. No acute intracranial abnormality. 2. Chronic infarcts and small vessel ischemic disease as above. 3. Unchanged head MRA. Less than 50% residual stenosis of the left petrous ICA. 4. 50% left supraclinoid ICA stenosis. 5. Mild distal right M1 and moderate to severe left MCA bifurcation stenoses. 6. Moderate left A1 ACA stenosis and chronic left A2 occlusion. 7. Mild mid basilar artery stenosis. 8. Moderate to severe bilateral PCA branch vessel stenoses.  2D echo - - Left ventricle: The cavity size was normal. There was moderate concentric hypertrophy. Systolic function was normal. The estimated ejection fraction was in the range of 60% to 65%. Wall motion was normal; there were no regional wall motion abnormalities. Doppler parameters are consistent with abnormal left ventricular relaxation (grade 1 diastolic dysfunction). - Aortic valve: Trileaflet; normal thickness leaflets. - Aortic root: The aortic root was normal in size. - Left atrium: The atrium was mildly dilated. - Right ventricle: Systolic function was normal. - Right atrium: The atrium was normal in size. - Tricuspid valve: There was mild regurgitation. - Pulmonic valve: There was no regurgitation. -  Pulmonary arteries: Systolic pressure was within the normal range. - Inferior vena cava: The vessel was normal in size. - Pericardium, extracardiac: There was no pericardial effusion.  Cerebral angio - S/P LT ICA intracranial angioplasty for severe symptomatic stenosis.with <20 % stenosis post angioplasty.   Component     Latest Ref Rng 06/27/2014 01/22/2015 01/23/2015 01/24/2015  Cholesterol     0 - 200 mg/dL  110    Triglycerides     <150  mg/dL  24    HDL Cholesterol     >40 mg/dL  44    Total CHOL/HDL Ratio       2.5    VLDL     0 - 40 mg/dL  5    LDL (calc)     0 - 99 mg/dL  61    Hemoglobin A1C     4.8 - 5.6 %  7.8 (H)    Mean Plasma Glucose       177    TSH     0.350 - 4.500 uIU/mL 2.651     HIV     Non Reactive   Non Reactive   Platelet Function  P2Y12     194 - 418 PRU    148 (L)    Assessment: As you may recall, he is a 82 y.o. 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 CTA head and neck showed no thrombosis distal to left petrous ICA stenosis. Pt had left MCA stroke in 2013 and had dizziness episode in 2015. Combination of the fact of recurrent left MCA stroke and chronic HTN running in 180s, he underwent cerebral angio with angioplasty, no stent required. Repeat MRA post procedure showed stenosis improved to 50%. A1C 7.8 and LDL 61. He was discharged with dual antiplatelet and crestor. During the interval time, the patient has been doing well. No recurrent stroke like symptoms. He was taking crestor 5mg  twice a week but was later discontinued by PCP. He followed up with Dr. Estanislado Pandy and repeat MRA showed stable left ICA petrous 50% and supraclinoid 50% stenosis. Will check P2Y12 in the future in hospital - difficult to get code from [place] in clinic.   Plan:  - Continue ASA and plavix for stroke prevention - check BP at home, goal 120-150 - check P2Y12 in hospital in the future with Dr. Estanislado Pandy. - check glucose at home - Follow up with primary care physician for stroke risk factor modification. Recommend maintain blood pressure goal 120-150, diabetes with hemoglobin A1c goal below 6.5% and lipids with LDL cholesterol goal below 70 mg/dL.  - continue to follow up with Dr. Estanislado Pandy - use brace at night for right hand - exercise with right hand at home. -  follow up in 6 months.  I spent more than 25 minutes of face to face time with the patient. Greater than 50% of time was spent in counseling and coordination of care.    No orders of the defined types were placed in this encounter.    No orders of the defined types were placed in this encounter.    Patient Instructions  - Continue ASA and plavix for stroke prevention - check BP at home, goal 120-150 - will check platelet inhibition today. - check glucose at home - Follow up with primary care physician for stroke risk factor modification. Recommend maintain  blood pressure goal 120-150, diabetes with hemoglobin A1c goal below 6.5% and lipids with LDL cholesterol goal below 70 mg/dL.  - continue to follow up with Dr. Estanislado Pandy - use brace at night for right hand - follow up in 6 months.    Rosalin Hawking, MD PhD [facility] [address] [place], [place] [address] [phone]

## 2015-06-24 NOTE — Patient Instructions (Addendum)
-   Continue ASA and plavix for stroke prevention - check BP at home, goal 120-150 - will check platelet inhibition today. - check glucose at home - Follow up with primary care physician for stroke risk factor modification. Recommend maintain blood pressure goal 120-150, diabetes with hemoglobin A1c goal below 6.5% and lipids with LDL cholesterol goal below 70 mg/dL.  - continue to follow up with Dr. Estanislado Pandy - use brace at night for right hand - follow up in 6 months.

## 2015-07-11 DIAGNOSIS — E7800 Pure hypercholesterolemia, unspecified: Secondary | ICD-10-CM | POA: Diagnosis not present

## 2015-07-11 DIAGNOSIS — N182 Chronic kidney disease, stage 2 (mild): Secondary | ICD-10-CM | POA: Diagnosis not present

## 2015-07-11 DIAGNOSIS — E1129 Type 2 diabetes mellitus with other diabetic kidney complication: Secondary | ICD-10-CM | POA: Diagnosis not present

## 2015-07-11 DIAGNOSIS — Z79899 Other long term (current) drug therapy: Secondary | ICD-10-CM | POA: Diagnosis not present

## 2015-07-15 DIAGNOSIS — N182 Chronic kidney disease, stage 2 (mild): Secondary | ICD-10-CM | POA: Diagnosis not present

## 2015-07-15 DIAGNOSIS — I10 Essential (primary) hypertension: Secondary | ICD-10-CM | POA: Diagnosis not present

## 2015-07-15 DIAGNOSIS — E1129 Type 2 diabetes mellitus with other diabetic kidney complication: Secondary | ICD-10-CM | POA: Diagnosis not present

## 2015-07-15 DIAGNOSIS — E7800 Pure hypercholesterolemia, unspecified: Secondary | ICD-10-CM | POA: Diagnosis not present

## 2015-07-15 DIAGNOSIS — Z6829 Body mass index (BMI) 29.0-29.9, adult: Secondary | ICD-10-CM | POA: Diagnosis not present

## 2015-10-16 ENCOUNTER — Other Ambulatory Visit: Payer: Self-pay | Admitting: *Deleted

## 2015-10-17 DIAGNOSIS — Z6829 Body mass index (BMI) 29.0-29.9, adult: Secondary | ICD-10-CM | POA: Diagnosis not present

## 2015-10-17 DIAGNOSIS — N390 Urinary tract infection, site not specified: Secondary | ICD-10-CM | POA: Diagnosis not present

## 2015-10-24 DIAGNOSIS — L270 Generalized skin eruption due to drugs and medicaments taken internally: Secondary | ICD-10-CM | POA: Diagnosis not present

## 2015-10-24 DIAGNOSIS — N390 Urinary tract infection, site not specified: Secondary | ICD-10-CM | POA: Diagnosis not present

## 2015-10-24 DIAGNOSIS — Z6829 Body mass index (BMI) 29.0-29.9, adult: Secondary | ICD-10-CM | POA: Diagnosis not present

## 2015-10-29 ENCOUNTER — Telehealth: Payer: Self-pay

## 2015-10-29 ENCOUNTER — Other Ambulatory Visit: Payer: Self-pay

## 2015-10-29 MED ORDER — HYDRALAZINE HCL 50 MG PO TABS: 50.0000 mg | ORAL_TABLET | Freq: Two times a day (BID) | ORAL | Status: DC

## 2015-10-29 NOTE — Telephone Encounter (Signed)
Called pt to confirm dosage of hydrazaline, pt states taking 50 mg BID, sent to pharmacy

## 2015-11-06 DIAGNOSIS — Z85828 Personal history of other malignant neoplasm of skin: Secondary | ICD-10-CM | POA: Diagnosis not present

## 2015-11-06 DIAGNOSIS — L821 Other seborrheic keratosis: Secondary | ICD-10-CM | POA: Diagnosis not present

## 2015-11-06 DIAGNOSIS — L570 Actinic keratosis: Secondary | ICD-10-CM | POA: Diagnosis not present

## 2015-11-06 DIAGNOSIS — L723 Sebaceous cyst: Secondary | ICD-10-CM | POA: Diagnosis not present

## 2015-11-06 DIAGNOSIS — D1801 Hemangioma of skin and subcutaneous tissue: Secondary | ICD-10-CM | POA: Diagnosis not present

## 2015-11-07 DIAGNOSIS — E7800 Pure hypercholesterolemia, unspecified: Secondary | ICD-10-CM | POA: Diagnosis not present

## 2015-11-07 DIAGNOSIS — E1129 Type 2 diabetes mellitus with other diabetic kidney complication: Secondary | ICD-10-CM | POA: Diagnosis not present

## 2015-11-12 DIAGNOSIS — I10 Essential (primary) hypertension: Secondary | ICD-10-CM | POA: Diagnosis not present

## 2015-11-12 DIAGNOSIS — E7800 Pure hypercholesterolemia, unspecified: Secondary | ICD-10-CM | POA: Diagnosis not present

## 2015-11-12 DIAGNOSIS — Z6829 Body mass index (BMI) 29.0-29.9, adult: Secondary | ICD-10-CM | POA: Diagnosis not present

## 2015-11-12 DIAGNOSIS — E1129 Type 2 diabetes mellitus with other diabetic kidney complication: Secondary | ICD-10-CM | POA: Diagnosis not present

## 2015-11-12 DIAGNOSIS — N182 Chronic kidney disease, stage 2 (mild): Secondary | ICD-10-CM | POA: Diagnosis not present

## 2015-12-02 ENCOUNTER — Encounter: Payer: Self-pay | Admitting: Cardiology

## 2015-12-02 ENCOUNTER — Ambulatory Visit (INDEPENDENT_AMBULATORY_CARE_PROVIDER_SITE_OTHER): Payer: Medicare Other | Admitting: Cardiology

## 2015-12-02 VITALS — BP 150/72 | HR 51 | Ht 73.0 in | Wt 220.0 lb

## 2015-12-02 DIAGNOSIS — I2510 Atherosclerotic heart disease of native coronary artery without angina pectoris: Secondary | ICD-10-CM

## 2015-12-02 DIAGNOSIS — E669 Obesity, unspecified: Secondary | ICD-10-CM

## 2015-12-02 DIAGNOSIS — I214 Non-ST elevation (NSTEMI) myocardial infarction: Secondary | ICD-10-CM | POA: Diagnosis not present

## 2015-12-02 DIAGNOSIS — E785 Hyperlipidemia, unspecified: Secondary | ICD-10-CM

## 2015-12-02 DIAGNOSIS — Z9861 Coronary angioplasty status: Secondary | ICD-10-CM

## 2015-12-02 DIAGNOSIS — I714 Abdominal aortic aneurysm, without rupture, unspecified: Secondary | ICD-10-CM

## 2015-12-02 DIAGNOSIS — I10 Essential (primary) hypertension: Secondary | ICD-10-CM

## 2015-12-02 DIAGNOSIS — I25118 Atherosclerotic heart disease of native coronary artery with other forms of angina pectoris: Secondary | ICD-10-CM

## 2015-12-02 DIAGNOSIS — I6300 Cerebral infarction due to thrombosis of unspecified precerebral artery: Secondary | ICD-10-CM

## 2015-12-02 NOTE — Progress Notes (Signed)
PCP: Leonides Sake, MD  Clinic Note: Chief Complaint  Patient presents with  . Follow-up  . Coronary Artery Disease    HPI: [name] is a 82 y.o. male with a PMH below who presents today for ~6 month f/u of CAD-CABG-PCI & CRFs.[name] was last seen onJan 2017 NSTEMI in ~Jan-Feb 2016.   Recent Hospitalizations: none Recent Studies: None Apparently Crestor was stopped by PCP  Interval History: [name] presents today without any major cardiac symptoms. He says he may have some chest twinges if he overexerts himself or gets really tired, but not with any normal routine of activity. Hematologic a little short of breath doing exertional activities but not chest pain at this point. No further stroke symptoms. Still slowly learning to cope with having lack of use of his right hand. He is now back on aspirin and Plavix without any major bleeding issues. Just some bruising. 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 He notes occasional sensations of dizziness as well as occasional intermittent palpitations but nothing prolonged. No lightheadedness or syncope/near syncope or TIA/amaurosis fugax. Mild edema but no PND, orthopnea. Edema is worse in the right.  ROS: A comprehensive was performed. Review of Systems  Constitutional: Positive for weight loss (on purpose!). Negative for malaise/fatigue.  HENT: Negative for nosebleeds.   Respiratory: Negative for cough and shortness of breath.   Cardiovascular: Negative for claudication and leg swelling.  Gastrointestinal: Positive for heartburn. Negative for constipation.  Musculoskeletal: Positive for back pain and joint pain (bilateral, but right greater than left knee). Negative for myalgias and falls.  Neurological: Positive for focal weakness (residual right hand plegia from stroke). Negative for weakness.  Endo/Heme/Allergies: Negative.   Psychiatric/Behavioral: Negative.          He is actually dealing with his current condition after stroke pretty well.  All other systems reviewed and are negative.   Past Medical History  Diagnosis Date  . Non-STEMI (non-ST elevated myocardial infarction) [facility]) July 2012    1. Ostial LAD 70% (to close to [facility] for PCI), subtotal occlusion of the RCA  . CAD in native artery     a. NSTEMI 11/2010 - CABG x2(LIMA to LAD, SVG to PDA-11/2010). b. NEGATIVE Lexiscan Myoview 6/9/'15, EF 53%, no perfusion abnormality, septal and apical HK noted. C. NSTEMI 05/2014 - s/p DES to SVG-RPDA 06/18/14 (Xience Alpine DES 3.0 x 18 mm -3.35 mm), EF 60-65%.  . 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 Servando Snare)  . Postoperative atrial fibrillation (HCC)     Post CABG, no sign recurrence  . HTN (hypertension)   . Type II diabetes mellitus with complication ([place])   . Hyperlipidemia LDL goal <70   . 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 Refusal of blood transfusions as patient is Jehovah's Witness   . Sleep apnea     Not on CPAP.  Marland Kitchen Abdominal aortic aneurysm ([place])     a. Aortic duplex 06/2014: mild aneurysmal dilatation of proximal abdominal aorta measuring 3.4x3.4cm. No sig change from 2012. F/u due 06/2016.  . Dizziness     a. Carotid duplex 03/2014: mild fibrous plaque, no significant stenosis.  . Stroke [facility]) 2014; 01/2015    a. 2014 with mild right hand weakness, nonhemorrhagic per pt.;; b. - PTA-Stent L ICA 95%     Past Surgical History  Procedure Laterality Date  . Cardiac catheterization  12/11/2010    Dr. Chase Picket - subsequent cath - normal LV systolic function, no renal artery stenosis, severe 2-vessel disease with subtotaled RCA prox and distal 60% lesion and complex 70% area of narrowing of ostium of LAD  . Coronary artery bypass graft  12/15/2010    X2, Dr Servando Snare; LIMA to LAD, SVG to PDA;   Marland Kitchen Cystoscopy with urethral dilatation    . Transthoracic echocardiogram  07/28/2012;  03/25/2015:    a. EF 55-60%, severe Conc LVH, Nl Systolic fxn, G1 DD,  Ao Sclerosis w/o stenosis; CRO PFO;; b. Moderate concentric LVH. EF 60-65%. No RWMA, Gr 1 DD. Mild LA dilation  . Left heart catheterization with coronary angiogram N/A 06/18/2014    Procedure: LEFT HEART CATHETERIZATION WITH CORONARY ANGIOGRAM;  Surgeon: Leonie Man, MD;  Location: [facility] CATH LAB;  Service: Cardiovascular;  -- severe disease of SVG-rPDA  . Radiology with anesthesia N/A 01/24/2015    Procedure: STENT ASSISTED ANGIOPLASTY (RADIOLOGY WITH ANESTHESIA);  Surgeon: Luanne Bras, MD;  Location: [place];  Service: Radiology;  Laterality: N/A;  . Coronary angioplasty with stent placement  06/18/2014    PCI to SVG-RPDA 06/18/14 (Xience Alpine DES 3.0 x 18 mm -3.35 mm),     Prior to Admission medications   Medication Sig Start Date End Date Taking? Authorizing Provider  amLODipine (NORVASC) 10 MG tablet Take 10 mg by mouth every evening.    Yes Historical Provider, MD  aspirin 325 MG tablet Take 1 tablet (325 mg total) by mouth daily with breakfast. 01/26/15  Yes Rosalin Hawking, MD  clopidogrel (PLAVIX) 75 MG tablet Take 1 tablet (75 mg total) by mouth daily. 12/19/14  Yes Leonie Man, MD  glimepiride (AMARYL) 4 MG tablet Take 4 mg by mouth 2 (two) times daily with a meal.    Yes Historical Provider, MD  hydrALAZINE (APRESOLINE) 50 MG tablet Take 1 tablet (50 mg total) by mouth 2 (two) times daily. 10/29/15  Yes Leonie Man, MD  labetalol (NORMODYNE) 200 MG tablet Take 200 mg by mouth 2 (two) times daily. 1/2 tablet twice a day   Yes Historical Provider, MD  losartan-hydrochlorothiazide (HYZAAR) 100-25 MG per tablet Take 1 tablet by mouth every morning.    Yes Historical Provider, MD  metFORMIN (GLUCOPHAGE) 1000 MG tablet Take 1 tablet by mouth 2 (two) times daily. 07/25/14  Yes Historical Provider, MD  nitroGLYCERIN (NITROSTAT) 0.4 MG SL tablet Place 1 tablet (0.4 mg total) under the tongue every 5 (five) minutes as needed  for chest pain (up to 3 doses). 06/19/14  Yes Dayna N Dunn, PA-C    Allergies  Allergen Reactions  . Atorvastatin     Myalgias  . Crestor [Rosuvastatin]   . Other Other (See Comments)    No  BLOOD PRODUCTS - Pt is Jeohovah's Witness  . Pravastatin     Myalgias  . Simvastatin     Myalgias     Social History   Social History  . Marital Status: Married    Spouse Name: [name]  . Number of Children: 1  . Years of Education: N/A   Occupational History  .     Social History Main Topics  . Smoking status: Former Smoker    Types: Cigarettes    Quit date: 05/27/1957  . Smokeless tobacco: Never Used  . Alcohol Use: No  . Drug Use: No  . Sexual Activity: Not Asked   Other Topics Concern  .  None   Social History Narrative   Married father of one.  Previously uses stationary bike routinely.  Now reduced due to other social stressors.   Quit smoking 50 years ago.  Does not drink alcohol   Family History  Problem Relation Age of Onset  . Heart Problems Father 78    Wt Readings from Last 3 Encounters:  12/02/15 220 lb (99.791 kg)  06/24/15 227 lb 6.4 oz (103.148 kg)  06/17/15 227 lb (102.967 kg)    PHYSICAL EXAM BP 150/72 mmHg  Pulse 51  Ht 6\' 1"  (1.854 m)  Wt 220 lb (99.791 kg)  BMI 29.03 kg/m2  SpO2 99% General appearance: alert, cooperative, appears stated age, no distress and mildly obese HEENT: [place]/AT, EOMI, MMM, anicteric sclera Neck: no adenopathy, no carotid bruit and no JVD Lungs: clear to auscultation bilaterally, normal percussion bilaterally and non-labored Heart: regular rate and rhythm, S1, S2 normal, soft 1/6 SEM at RUSB radiating to carotids, click, rub or gallop ; nondisplaced PMI Abdomen: soft, non-tender; bowel sounds normal; no masses, no organomegaly;  Extremities: extremities normal, atraumatic, no cyanosis, and ~1+ bilateral RLE edema Pulses: 2+ and symmetric;  Neurologic: Mental status: Alert, oriented, thought content appropriate; Notable  plegia & decreased sensation of R hand from wrist.  CN II-XII grossly intact.    Adult ECG Report - Sinus bradycardia, rate 51 BPM. Otherwise stable with normal axis, intervals and durations. Stable EKG.  Other studies Reviewed: Additional studies/ records that were reviewed today include:  Recent Labs:   Lab Results  Component Value Date   CHOL 110 01/22/2015   HDL 44 01/22/2015   LDLCALC 61 01/22/2015   TRIG 24 01/22/2015   CHOLHDL 2.5 01/22/2015    ASSESSMENT / PLAN: Problem List Items Addressed This Visit    Stroke ([place]) (Chronic)    Continue Plavix and aspirin. Intolerant of statin the past. -- May need to reassess.      Obesity (Chronic)    The patient understands the need to lose weight with diet and exercise. We have discussed specific strategies for this. Stay active with exercise & cut back foods.      Moderate essential hypertension - Primary (Chronic)    Still has higher blood pressures than expected. Is on Norvasc and Hyzaar along with labetalol. If pressure continues to be difficult to manage, would consider strongly converting from labetalol to carvedilol.      Hyperlipidemia LDL goal <70 (Chronic)    Crestor apparently was stopped prior to his last visit. His lipids look pretty well-controlled, think are okay with holding off her statins for now to avoid any myalgias.      History of: Non-STEMI (non-ST elevated myocardial infarction) (Chronic)   Essential hypertension (Chronic)    Blood pressures a little bit up today. Partly because of having some occasional dizziness spells, I'm reluctant to be too aggressive blood pressure control, however we could increase hydralazine to 75 mg twice a day if necessary. Lack of heart rate precludes titration of labetalol further.      Coronary artery disease involving native coronary artery (Chronic)    Overall pretty stable as far as symptoms go. May be mild stable anginal symptoms, but nothing concerning. Is on  aspirin, beta blocker, ARB and calcium channel blocker. Not on statin secondary to intolerance.       CAD S/P percutaneous coronary angioplasty (Chronic)    Stents were patent but last evaluation. Remains on aspirin plus Plavix for stroke prevention. Calcium channel  blocker serves as an antianginal effect, I still told leery of the use of labetalol, but the dosing seems to be even lower. See the patient.      Abdominal aortic aneurysm (HCC) (Chronic)    We talked about repeating Doppler ultrasound evaluation soon in order to be seen in follow-up next week.         Current medicines are reviewed at length with the patient today. (+/- concerns) none The following changes have been made: none   Studies Ordered:   No orders of the defined types were placed in this encounter.    ROV - 6 months   Glenetta Hew, M.D., M.S. Interventional Cardiologist   Pager # [phone] Phone # [phone] [address]. [place], [place] [address]

## 2015-12-02 NOTE — Patient Instructions (Signed)
Your physician wants you to follow-up in: 6 months or sooner if needed. You will receive a reminder letter in the mail two months in advance. If you don't receive a letter, please call our office to schedule the follow-up appointment.   If you need a refill on your cardiac medications before your next appointment, please call your pharmacy. 

## 2015-12-03 ENCOUNTER — Other Ambulatory Visit (HOSPITAL_COMMUNITY): Payer: Self-pay | Admitting: Interventional Radiology

## 2015-12-03 DIAGNOSIS — I771 Stricture of artery: Secondary | ICD-10-CM

## 2015-12-04 ENCOUNTER — Encounter: Payer: Self-pay | Admitting: Cardiology

## 2015-12-04 NOTE — Assessment & Plan Note (Signed)
The patient understands the need to lose weight with diet and exercise. We have discussed specific strategies for this. Stay active with exercise & cut back foods.

## 2015-12-04 NOTE — Assessment & Plan Note (Signed)
Crestor apparently was stopped prior to his last visit. His lipids look pretty well-controlled, think are okay with holding off her statins for now to avoid any myalgias.

## 2015-12-04 NOTE — Assessment & Plan Note (Addendum)
Still has higher blood pressures than expected. Is on Norvasc and Hyzaar along with labetalol. If pressure continues to be difficult to manage, would consider strongly converting from labetalol to carvedilol.

## 2015-12-04 NOTE — Assessment & Plan Note (Signed)
Stents were patent but last evaluation. Remains on aspirin plus Plavix for stroke prevention. Calcium channel blocker serves as an antianginal effect, I still told leery of the use of labetalol, but the dosing seems to be even lower. See the patient.

## 2015-12-04 NOTE — Assessment & Plan Note (Signed)
Overall pretty stable as far as symptoms go. May be mild stable anginal symptoms, but nothing concerning. Is on aspirin, beta blocker, ARB and calcium channel blocker. Not on statin secondary to intolerance.

## 2015-12-04 NOTE — Assessment & Plan Note (Signed)
Blood pressures a little bit up today. Partly because of having some occasional dizziness spells, I'm reluctant to be too aggressive blood pressure control, however we could increase hydralazine to 75 mg twice a day if necessary. Lack of heart rate precludes titration of labetalol further.

## 2015-12-04 NOTE — Assessment & Plan Note (Signed)
Continue Plavix and aspirin. Intolerant of statin the past. -- May need to reassess.

## 2015-12-04 NOTE — Assessment & Plan Note (Signed)
We talked about repeating Doppler ultrasound evaluation soon in order to be seen in follow-up next week.

## 2015-12-05 ENCOUNTER — Other Ambulatory Visit: Payer: Self-pay | Admitting: Cardiology

## 2015-12-06 NOTE — Addendum Note (Signed)
Addended by: Cristopher Estimable on: 12/06/2015 07:17 AM   Modules accepted: Orders

## 2015-12-13 ENCOUNTER — Ambulatory Visit (HOSPITAL_COMMUNITY): Payer: Medicare Other | Attending: Interventional Radiology

## 2015-12-13 ENCOUNTER — Encounter (HOSPITAL_COMMUNITY): Payer: Self-pay

## 2015-12-13 ENCOUNTER — Ambulatory Visit (HOSPITAL_COMMUNITY): Admission: RE | Admit: 2015-12-13 | Payer: Medicare Other | Source: Ambulatory Visit

## 2015-12-16 ENCOUNTER — Emergency Department (HOSPITAL_COMMUNITY): Payer: Medicare Other

## 2015-12-16 ENCOUNTER — Emergency Department (HOSPITAL_COMMUNITY)
Admission: EM | Admit: 2015-12-16 | Discharge: 2015-12-16 | Disposition: A | Discharge status: Home / Self Care | Payer: Medicare Other | Attending: Emergency Medicine | Admitting: Emergency Medicine

## 2015-12-16 ENCOUNTER — Encounter (HOSPITAL_COMMUNITY): Payer: Self-pay | Admitting: Emergency Medicine

## 2015-12-16 DIAGNOSIS — I2510 Atherosclerotic heart disease of native coronary artery without angina pectoris: Secondary | ICD-10-CM | POA: Diagnosis not present

## 2015-12-16 DIAGNOSIS — I10 Essential (primary) hypertension: Secondary | ICD-10-CM | POA: Diagnosis not present

## 2015-12-16 DIAGNOSIS — Z79899 Other long term (current) drug therapy: Secondary | ICD-10-CM | POA: Diagnosis not present

## 2015-12-16 DIAGNOSIS — E119 Type 2 diabetes mellitus without complications: Secondary | ICD-10-CM | POA: Insufficient documentation

## 2015-12-16 DIAGNOSIS — Z955 Presence of coronary angioplasty implant and graft: Secondary | ICD-10-CM | POA: Insufficient documentation

## 2015-12-16 DIAGNOSIS — Z8673 Personal history of transient ischemic attack (TIA), and cerebral infarction without residual deficits: Secondary | ICD-10-CM | POA: Diagnosis not present

## 2015-12-16 DIAGNOSIS — Z87891 Personal history of nicotine dependence: Secondary | ICD-10-CM | POA: Insufficient documentation

## 2015-12-16 DIAGNOSIS — Z7984 Long term (current) use of oral hypoglycemic drugs: Secondary | ICD-10-CM | POA: Insufficient documentation

## 2015-12-16 DIAGNOSIS — N390 Urinary tract infection, site not specified: Secondary | ICD-10-CM | POA: Diagnosis not present

## 2015-12-16 DIAGNOSIS — R339 Retention of urine, unspecified: Secondary | ICD-10-CM | POA: Insufficient documentation

## 2015-12-16 DIAGNOSIS — Z7982 Long term (current) use of aspirin: Secondary | ICD-10-CM | POA: Insufficient documentation

## 2015-12-16 DIAGNOSIS — R791 Abnormal coagulation profile: Secondary | ICD-10-CM | POA: Diagnosis not present

## 2015-12-16 LAB — CBC WITH DIFFERENTIAL/PLATELET
BASOS ABS: 0 10*3/uL (ref 0.0–0.1)
Basophils Relative: 0 %
EOS ABS: 0.3 10*3/uL (ref 0.0–0.7)
EOS PCT: 3 %
HCT: 40 % (ref 39.0–52.0)
Hemoglobin: 12.8 g/dL — ABNORMAL LOW (ref 13.0–17.0)
Lymphocytes Relative: 17 %
Lymphs Abs: 1.8 10*3/uL (ref 0.7–4.0)
MCH: 29.4 pg (ref 26.0–34.0)
MCHC: 32 g/dL (ref 30.0–36.0)
MCV: 91.7 fL (ref 78.0–100.0)
MONO ABS: 0.9 10*3/uL (ref 0.1–1.0)
Monocytes Relative: 8 %
Neutro Abs: 7.4 10*3/uL (ref 1.7–7.7)
Neutrophils Relative %: 72 %
PLATELETS: 410 10*3/uL — AB (ref 150–400)
RBC: 4.36 MIL/uL (ref 4.22–5.81)
RDW: 15.8 % — AB (ref 11.5–15.5)
WBC: 10.5 10*3/uL (ref 4.0–10.5)

## 2015-12-16 LAB — URINALYSIS, ROUTINE W REFLEX MICROSCOPIC
BILIRUBIN URINE: NEGATIVE
Glucose, UA: NEGATIVE mg/dL
KETONES UR: NEGATIVE mg/dL
Nitrite: NEGATIVE
PROTEIN: 100 mg/dL — AB
Specific Gravity, Urine: 1.014 (ref 1.005–1.030)
pH: 7 (ref 5.0–8.0)

## 2015-12-16 LAB — URINE MICROSCOPIC-ADD ON

## 2015-12-16 LAB — BASIC METABOLIC PANEL
ANION GAP: 9 (ref 5–15)
BUN: 24 mg/dL — AB (ref 6–20)
CALCIUM: 9.4 mg/dL (ref 8.9–10.3)
CO2: 26 mmol/L (ref 22–32)
Chloride: 103 mmol/L (ref 101–111)
Creatinine, Ser: 1.07 mg/dL (ref 0.61–1.24)
GFR calc Af Amer: >60 mL/min (ref >60)
Glucose, Bld: 127 mg/dL — ABNORMAL HIGH (ref 65–99)
POTASSIUM: 4 mmol/L (ref 3.5–5.1)
SODIUM: 138 mmol/L (ref 135–145)

## 2015-12-16 LAB — APTT: APTT: 28 s (ref 24–36)

## 2015-12-16 LAB — PROTIME-INR
INR: 0.98
PROTHROMBIN TIME: 13 s (ref 11.4–15.2)

## 2015-12-16 MED ORDER — NALOXONE HCL 0.4 MG/ML IJ SOLN
INTRAMUSCULAR | Status: AC
  Filled 2015-12-16: qty 1

## 2015-12-16 MED ORDER — FENTANYL CITRATE (PF) 100 MCG/2ML IJ SOLN
100.0000 ug | Freq: Once | INTRAMUSCULAR | Status: AC
  Administered 2015-12-16: 100 ug via INTRAVENOUS
  Filled 2015-12-16: qty 2

## 2015-12-16 MED ORDER — MIDAZOLAM HCL 2 MG/2ML IJ SOLN
INTRAMUSCULAR | Status: AC | PRN
  Administered 2015-12-16: 2 mg via INTRAVENOUS

## 2015-12-16 MED ORDER — MIDAZOLAM HCL 2 MG/2ML IJ SOLN
INTRAMUSCULAR | Status: AC
  Filled 2015-12-16: qty 4

## 2015-12-16 MED ORDER — LIDOCAINE HCL (PF) 1 % IJ SOLN
INTRAMUSCULAR | Status: AC
  Filled 2015-12-16: qty 10

## 2015-12-16 MED ORDER — CEPHALEXIN 500 MG PO CAPS
500.0000 mg | ORAL_CAPSULE | Freq: Three times a day (TID) | ORAL | 0 refills | Status: DC
Start: 2015-12-16 — End: 2016-03-28

## 2015-12-16 MED ORDER — FENTANYL CITRATE (PF) 100 MCG/2ML IJ SOLN
INTRAMUSCULAR | Status: AC | PRN
  Administered 2015-12-16: 100 ug via INTRAVENOUS

## 2015-12-16 MED ORDER — FENTANYL CITRATE (PF) 100 MCG/2ML IJ SOLN
INTRAMUSCULAR | Status: AC
  Filled 2015-12-16: qty 4

## 2015-12-16 MED ORDER — FLUMAZENIL 1 MG/10ML IV SOLN
INTRAVENOUS | Status: AC
  Filled 2015-12-16: qty 10

## 2015-12-16 MED ORDER — CEFTRIAXONE SODIUM 1 G IJ SOLR
1.0000 g | Freq: Once | INTRAMUSCULAR | Status: AC
  Administered 2015-12-16: 1 g via INTRAMUSCULAR
  Filled 2015-12-16: qty 10

## 2015-12-16 MED ORDER — LIDOCAINE HCL (PF) 1 % IJ SOLN
INTRAMUSCULAR | Status: AC
  Administered 2015-12-16: 08:00:00
  Filled 2015-12-16: qty 5

## 2015-12-16 MED ORDER — CEPHALEXIN 250 MG PO CAPS
500.0000 mg | ORAL_CAPSULE | Freq: Once | ORAL | Status: AC
  Administered 2015-12-16: 500 mg via ORAL
  Filled 2015-12-16: qty 2

## 2015-12-16 NOTE — Procedures (Signed)
US guided 12f suprapubic drain placed No complication No blood loss. See complete dictation in [facility].

## 2015-12-16 NOTE — ED Provider Notes (Addendum)
9 AM patient is alert awake appears in no distress. Awaiting suprapubic catheter   Orlie Dakin, MD 12/16/15 0901 9:15 AM requesting medicine for discomfort at suprapubic area. Awaiting catheter placement. IV fentanyl ordered. 3:40 PM patient resting comfortably. After suprapubic catheter placement. Minimal bleeding. He is in no distress. Plan prescription Keflex. Urine sent for culture. Follow up with [facility] this week Results for orders placed or performed during the hospital encounter of 12/16/15  Urinalysis, Routine w reflex microscopic (not at [facility])  Result Value Ref Range   Color, Urine STRAW (A) YELLOW   APPearance CLOUDY (A) CLEAR   Specific Gravity, Urine 1.014 1.005 - 1.030   pH 7.0 5.0 - 8.0   Glucose, UA NEGATIVE NEGATIVE mg/dL   Hgb urine dipstick SMALL (A) NEGATIVE   Bilirubin Urine NEGATIVE NEGATIVE   Ketones, ur NEGATIVE NEGATIVE mg/dL   Protein, ur 100 (A) NEGATIVE mg/dL   Nitrite NEGATIVE NEGATIVE   Leukocytes, UA MODERATE (A) NEGATIVE  CBC with Differential  Result Value Ref Range   WBC 10.5 4.0 - 10.5 K/uL   RBC 4.36 4.22 - 5.81 MIL/uL   Hemoglobin 12.8 (L) 13.0 - 17.0 g/dL   HCT 40.0 39.0 - 52.0 %   MCV 91.7 78.0 - 100.0 fL   MCH 29.4 26.0 - 34.0 pg   MCHC 32.0 30.0 - 36.0 g/dL   RDW 15.8 (H) 11.5 - 15.5 %   Platelets 410 (H) 150 - 400 K/uL   Neutrophils Relative % 72 %   Neutro Abs 7.4 1.7 - 7.7 K/uL   Lymphocytes Relative 17 %   Lymphs Abs 1.8 0.7 - 4.0 K/uL   Monocytes Relative 8 %   Monocytes Absolute 0.9 0.1 - 1.0 K/uL   Eosinophils Relative 3 %   Eosinophils Absolute 0.3 0.0 - 0.7 K/uL   Basophils Relative 0 %   Basophils Absolute 0.0 0.0 - 0.1 K/uL  Basic metabolic panel  Result Value Ref Range   Sodium 138 135 - 145 mmol/L   Potassium 4.0 3.5 - 5.1 mmol/L   Chloride 103 101 - 111 mmol/L   CO2 26 22 - 32 mmol/L   Glucose, Bld 127 (H) 65 - 99 mg/dL   BUN 24 (H) 6 - 20 mg/dL   Creatinine, Ser 1.07 0.61 - 1.24 mg/dL   Calcium 9.4  8.9 - 10.3 mg/dL   GFR calc non Af Amer >60 >60 mL/min   GFR calc Af Amer >60 >60 mL/min   Anion gap 9 5 - 15  Urine microscopic-add on  Result Value Ref Range   Squamous Epithelial / LPF 0-5 (A) NONE SEEN   WBC, UA TOO NUMEROUS TO COUNT 0 - 5 WBC/hpf   RBC / HPF 6-30 0 - 5 RBC/hpf   Bacteria, UA FEW (A) NONE SEEN   Urine-Other WBC CLUMPS   Protime-INR  Result Value Ref Range   Prothrombin Time 13.0 11.4 - 15.2 seconds   INR 0.98   APTT  Result Value Ref Range   aPTT 28 24 - 36 seconds   No results found.   Orlie Dakin, MD 12/16/15 [phone]

## 2015-12-16 NOTE — ED Triage Notes (Signed)
Patient reports urinary retention " it dribbles" onset this week with urinary urgency/frequency , denies fever or chills.

## 2015-12-16 NOTE — ED Provider Notes (Signed)
[facility] DEPT Provider Note   CSN: [id number] Arrival date & time: 12/16/15  0220  First Provider Contact:  First MD Initiated Contact with Patient 12/16/15 [phone]        History   Chief Complaint Chief Complaint  Patient presents with  . Urinary Retention    HPI [name] is a 82 y.o. male.  HPI  This is an 82 year old male who presents with urinary frequency and urgency. Patient reports one-day history of worsening urinary frequency and urgency. He states that last night he got up every 30 minutes to urinate and only "dribbled."  He denies history of hypertension but does report difficult Foley placement during a surgery. Denies any fevers or back pain.  Past Medical History:  Diagnosis Date  . Abdominal aortic aneurysm ([place])    a. Aortic duplex 06/2014: mild aneurysmal dilatation of proximal abdominal aorta measuring 3.4x3.4cm. No sig change from 2012. F/u due 06/2016.  . 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 CAD in native artery    a. NSTEMI 11/2010 - CABG x2(LIMA to LAD, SVG to PDA-11/2010). b. NEGATIVE Lexiscan Myoview 6/9/'15, EF 53%, no perfusion abnormality, septal and apical HK noted. C. NSTEMI 05/2014 - s/p DES to SVG-RPDA 06/18/14 (Xience Alpine DES 3.0 x 18 mm -3.35 mm), EF 60-65%.  . Dizziness    a. Carotid duplex 03/2014: mild fibrous plaque, no significant stenosis.  Marland Kitchen HTN (hypertension)   . Hyperlipidemia LDL goal <70   . Non-STEMI (non-ST elevated myocardial infarction) [facility]) July 2012   1. Ostial LAD 70% (to close to [facility] for PCI), subtotal occlusion of the RCA  . Postoperative atrial fibrillation (HCC)    Post CABG, no sign recurrence  . Refusal of blood transfusions as patient is Jehovah's Witness   . 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 Servando Snare)  . Sleep apnea    Not on CPAP.  Marland Kitchen Stroke [facility]) 2014; 01/2015   a. 2014 with mild right hand weakness, nonhemorrhagic per pt.;; b. - PTA-Stent L ICA 95%     . Type II diabetes mellitus with complication [facility])     Patient Active Problem List   Diagnosis Date Noted  . Carpal tunnel syndrome of right wrist 06/24/2015  . History of stroke 03/21/2015  . Type 2 diabetes mellitus with circulatory disorder ([place]) 03/21/2015  . Stenosis of carotid artery   . Essential hypertension   . CVA (cerebral infarction) 01/22/2015  . CAD S/P percutaneous coronary angioplasty 09/19/2014  . Coronary artery disease involving coronary bypass graft of native heart with other forms of angina pectoris ([place]) 09/19/2014  . Shortness of breath 09/19/2014  . Polypharmacy 09/19/2014  . Abdominal aortic aneurysm ([place])   . Stroke ([place])   . Hyperlipidemia LDL goal <70   . Type II diabetes mellitus with complication ([place])   . Coronary artery disease involving native coronary artery   . Abnormal TSH 06/19/2014  . Obesity 06/19/2014  . Bilateral lower extremity edema 03/30/2014  . Dizziness 02/21/2014  . Bradycardia 08/31/2013  . Postoperative atrial fibrillation (HCC)     Class: Temporary  . Diabetes mellitus type 2 in obese ([place])   . Moderate essential hypertension   . Apnea, sleep   . Balanitis xerotica obliterans   . History of: Non-STEMI (non-ST elevated myocardial infarction) 11/16/2010    Class: History of  . S/P CABG x 2 11/16/2010    Past Surgical History:  Procedure Laterality Date  . CARDIAC CATHETERIZATION  12/11/2010   Dr. Chase Picket - subsequent cath - normal LV systolic function, no renal artery stenosis, severe 2-vessel disease with subtotaled RCA prox and distal 60% lesion and complex 70% area of narrowing of ostium of LAD  . CORONARY ANGIOPLASTY WITH STENT PLACEMENT  06/18/2014   PCI to SVG-RPDA 06/18/14 (Xience Alpine DES 3.0 x 18 mm -3.35 mm),   . CORONARY ARTERY BYPASS GRAFT  12/15/2010   X2, Dr Servando Snare; LIMA to LAD, SVG to PDA;   . CYSTOSCOPY WITH URETHRAL DILATATION    . LEFT HEART CATHETERIZATION WITH CORONARY ANGIOGRAM N/A 06/18/2014    Procedure: LEFT HEART CATHETERIZATION WITH CORONARY ANGIOGRAM;  Surgeon: Leonie Man, MD;  Location: [facility] CATH LAB;  Service: Cardiovascular;  -- severe disease of SVG-rPDA  . RADIOLOGY WITH ANESTHESIA N/A 01/24/2015   Procedure: STENT ASSISTED ANGIOPLASTY (RADIOLOGY WITH ANESTHESIA);  Surgeon: Luanne Bras, MD;  Location: [facility];  Service: Radiology;  Laterality: N/A;  . TRANSTHORACIC ECHOCARDIOGRAM  07/28/2012; 03/25/2015:   a. EF 55-60%, severe Conc LVH, Nl Systolic fxn, G1 DD,  Ao Sclerosis w/o stenosis; CRO PFO;; b. Moderate concentric LVH. EF 60-65%. No RWMA, Gr 1 DD. Mild LA dilation       Home Medications    Prior to Admission medications   Medication Sig Start Date End Date Taking? Authorizing Provider  amLODipine (NORVASC) 10 MG tablet Take 10 mg by mouth every evening.    Yes Historical Provider, MD  aspirin 325 MG tablet Take 1 tablet (325 mg total) by mouth daily with breakfast. 01/26/15  Yes Rosalin Hawking, MD  clopidogrel (PLAVIX) 75 MG tablet Take 1 tablet (75 mg total) by mouth daily. Patient taking differently: Take 37.5 mg by mouth every other day.  12/19/14  Yes Leonie Man, MD  glimepiride (AMARYL) 4 MG tablet Take 4 mg by mouth 2 (two) times daily with a meal.    Yes Historical Provider, MD  hydrALAZINE (APRESOLINE) 50 MG tablet Take 1 tablet (50 mg total) by mouth 2 (two) times daily. 10/29/15  Yes Leonie Man, MD  labetalol (NORMODYNE) 200 MG tablet Take 100 mg by mouth 2 (two) times daily.    Yes Historical Provider, MD  losartan-hydrochlorothiazide (HYZAAR) 100-25 MG per tablet Take 1 tablet by mouth every morning.    Yes Historical Provider, MD  metFORMIN (GLUCOPHAGE) 1000 MG tablet Take 1 tablet by mouth 2 (two) times daily. 07/25/14  Yes Historical Provider, MD  nitroGLYCERIN (NITROSTAT) 0.4 MG SL tablet Place 1 tablet (0.4 mg total) under the tongue every 5 (five) minutes as needed for chest pain (up to 3 doses). 06/19/14  Yes Dayna N Dunn, PA-C    Family  History Family History  Problem Relation Age of Onset  . Heart Problems Father 37    Social History Social History  Substance Use Topics  . Smoking status: Former Smoker    Types: Cigarettes    Quit date: 05/27/1957  . Smokeless tobacco: Never Used  . Alcohol use No     Allergies   Atorvastatin; Crestor [rosuvastatin]; Other; Pravastatin; and Simvastatin   Review of Systems Review of Systems  Constitutional: Negative for fever.  Respiratory: Negative for shortness of breath.   Cardiovascular: Negative for chest pain.  Gastrointestinal: Negative for abdominal pain, nausea and vomiting.  Genitourinary: Positive for decreased urine volume, difficulty urinating, frequency and urgency. Negative for flank pain and penile pain.  All other systems reviewed and are negative.  Physical Exam Updated Vital Signs BP 183/90 (BP Location: Right Arm)   Pulse 77   Temp 97.8 F (36.6 C) (Oral)   Resp 18   Ht 6\' 1"  (1.854 m)   Wt 210 lb (95.3 kg)   SpO2 100%   BMI 27.71 kg/m   Physical Exam  Constitutional: He is oriented to person, place, and time. He appears well-developed and well-nourished.  HENT:  Head: Normocephalic and atraumatic.  Cardiovascular: Normal rate and regular rhythm.   Murmur heard. Pulmonary/Chest: Effort normal and breath sounds normal. No respiratory distress. He has no wheezes.  Abdominal: Soft. Bowel sounds are normal. There is tenderness. There is no rebound and no guarding.  Suprapubic tenderness to palpation without rebound or guarding, no CVA tenderness  Genitourinary:  Genitourinary Comments: Stenosis at the urethral meatus, unable to retract or stretch, granulomatous tissue noted  Lymphadenopathy:    He has no cervical adenopathy.  Neurological: He is alert and oriented to person, place, and time.  Skin: Skin is warm and dry.  Psychiatric: He has a normal mood and affect.  Nursing note and vitals reviewed.      ED Treatments / Results    Labs (all labs ordered are listed, but only abnormal results are displayed) Labs Reviewed  URINALYSIS, ROUTINE W REFLEX MICROSCOPIC (NOT AT [facility]) - Abnormal; Notable for the following:       Result Value   Color, Urine STRAW (*)    APPearance CLOUDY (*)    Hgb urine dipstick SMALL (*)    Protein, ur 100 (*)    Leukocytes, UA MODERATE (*)    All other components within normal limits  CBC WITH DIFFERENTIAL/PLATELET - Abnormal; Notable for the following:    Hemoglobin 12.8 (*)    RDW 15.8 (*)    Platelets 410 (*)    All other components within normal limits  BASIC METABOLIC PANEL - Abnormal; Notable for the following:    Glucose, Bld 127 (*)    BUN 24 (*)    All other components within normal limits  URINE MICROSCOPIC-ADD ON - Abnormal; Notable for the following:    Squamous Epithelial / LPF 0-5 (*)    Bacteria, UA FEW (*)    All other components within normal limits  URINE CULTURE    EKG  EKG Interpretation None       Radiology No results found.  Procedures Procedures (including critical care time)  Medications Ordered in ED Medications  cefTRIAXone (ROCEPHIN) injection 1 g (1 g Intramuscular Given 12/16/15 0750)  lidocaine (PF) (XYLOCAINE) 1 % injection (  Given 12/16/15 0752)     Initial Impression / Assessment and Plan / ED Course  I have reviewed the triage vital signs and the nursing notes.  Pertinent labs & imaging results that were available during my care of the patient were reviewed by me and considered in my medical decision making (see chart for details).  Clinical Course   Patient presents with urinary frequency urgency, retention. Noted to have a urinary tract infection. Physical exam is notable for an abnormal GU exam with stenosis of the meatus. Patient is very uncomfortable. Post void residuals of 360 and 440. He has been able to void spontaneously son but still reports much discomfort. States that his stream at baseline is abnormal but he is just  "dribbling now." Will not likely be able to pass a Foley catheter given his anatomy. Discussed with Dr. Jeffie Pollock, urology. He recommends suprapubic catheterization for relief of the patient's  symptoms. If this can be obtained by IR, patient can follow-up with Dr. Jeffie Pollock for definitive options. Discussed the patient with Dr. Vernard Gambles.  Patient is on Plavix. Last took his last dose on Saturday.  Given his discomfort, it is possible to place a catheter although he may be at some increased risk of bleeding. They will evaluate the patient for placement of a suprapubic catheter. I discussed this with the patient.  Assuming no complications, he can be observed and discharged with antibiotics 1 week and follow-up with urology.  Final Clinical Impressions(s) / ED Diagnoses   Final diagnoses:  Urinary retention    New Prescriptions New Prescriptions   No medications on file     Merryl Hacker, MD 12/16/15 [phone]

## 2015-12-16 NOTE — Consult Note (Signed)
Chief Complaint: Patient was seen in consultation today for suprapubic catheter placement Chief Complaint  Patient presents with  . Urinary Retention   at the request of Dr Lyn Hollingshead  Referring Physician(s): Dr Lyn Hollingshead  Supervising Physician: Arne Cleveland  Patient Status: Inpatient  History of Present Illness: [name] is a 82 y.o. male   Pt with urinary retention for days Worsening and increasing pain Has been able to put out some urine per urethra---approx 50 cc in urinal at bedside Yellow in color ED MD attempted foley catheter placement at bedside (see picture) Scarring and urethra stenosis Pt states he had botched circumcision as adult years ago Dr Dina Rich discussed with Dr Jeffie Pollock Uro---recommended suprapubic catheter placement Dr Vernard Gambles has reviewed imaging and chart- approves procedure    Past Medical History:  Diagnosis Date  . Abdominal aortic aneurysm ([place])    a. Aortic duplex 06/2014: mild aneurysmal dilatation of proximal abdominal aorta measuring 3.4x3.4cm. No sig change from 2012. F/u due 06/2016.  . 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 CAD in native artery    a. NSTEMI 11/2010 - CABG x2(LIMA to LAD, SVG to PDA-11/2010). b. NEGATIVE Lexiscan Myoview 6/9/'15, EF 53%, no perfusion abnormality, septal and apical HK noted. C. NSTEMI 05/2014 - s/p DES to SVG-RPDA 06/18/14 (Xience Alpine DES 3.0 x 18 mm -3.35 mm), EF 60-65%.  . Dizziness    a. Carotid duplex 03/2014: mild fibrous plaque, no significant stenosis.  Marland Kitchen HTN (hypertension)   . Hyperlipidemia LDL goal <70   . Non-STEMI (non-ST elevated myocardial infarction) [facility]) July 2012   1. Ostial LAD 70% (to close to [facility] for PCI), subtotal occlusion of the RCA  . Postoperative atrial fibrillation (HCC)    Post CABG, no sign recurrence  . Refusal of blood transfusions as patient is Jehovah's Witness   . 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  Servando Snare)  . Sleep apnea    Not on CPAP.  Marland Kitchen Stroke [facility]) 2014; 01/2015   a. 2014 with mild right hand weakness, nonhemorrhagic per pt.;; b. - PTA-Stent L ICA 95%   . Type II diabetes mellitus with complication [facility])     Past Surgical History:  Procedure Laterality Date  . CARDIAC CATHETERIZATION  12/11/2010   Dr. Chase Picket - subsequent cath - normal LV systolic function, no renal artery stenosis, severe 2-vessel disease with subtotaled RCA prox and distal 60% lesion and complex 70% area of narrowing of ostium of LAD  . CORONARY ANGIOPLASTY WITH STENT PLACEMENT  06/18/2014   PCI to SVG-RPDA 06/18/14 (Xience Alpine DES 3.0 x 18 mm -3.35 mm),   . CORONARY ARTERY BYPASS GRAFT  12/15/2010   X2, Dr Servando Snare; LIMA to LAD, SVG to PDA;   . CYSTOSCOPY WITH URETHRAL DILATATION    . LEFT HEART CATHETERIZATION WITH CORONARY ANGIOGRAM N/A 06/18/2014   Procedure: LEFT HEART CATHETERIZATION WITH CORONARY ANGIOGRAM;  Surgeon: Leonie Man, MD;  Location: [facility] CATH LAB;  Service: Cardiovascular;  -- severe disease of SVG-rPDA  . RADIOLOGY WITH ANESTHESIA N/A 01/24/2015   Procedure: STENT ASSISTED ANGIOPLASTY (RADIOLOGY WITH ANESTHESIA);  Surgeon: Luanne Bras, MD;  Location: [place];  Service: Radiology;  Laterality: N/A;  . TRANSTHORACIC ECHOCARDIOGRAM  07/28/2012; 03/25/2015:   a. EF 55-60%, severe Conc LVH, Nl Systolic fxn, G1 DD,  Ao Sclerosis w/o stenosis; CRO PFO;; b. Moderate concentric LVH. EF 60-65%. No RWMA, Gr 1  DD. Mild LA dilation    Allergies: Atorvastatin; Crestor [rosuvastatin]; Other; Pravastatin; and Simvastatin  Medications: Prior to Admission medications   Medication Sig Start Date End Date Taking? Authorizing Provider  amLODipine (NORVASC) 10 MG tablet Take 10 mg by mouth every evening.    Yes Historical Provider, MD  aspirin 325 MG tablet Take 1 tablet (325 mg total) by mouth daily with breakfast. 01/26/15  Yes Rosalin Hawking, MD  clopidogrel (PLAVIX) 75 MG tablet Take 1 tablet (75 mg  total) by mouth daily. Patient taking differently: Take 37.5 mg by mouth every other day.  12/19/14  Yes Leonie Man, MD  glimepiride (AMARYL) 4 MG tablet Take 4 mg by mouth 2 (two) times daily with a meal.    Yes Historical Provider, MD  hydrALAZINE (APRESOLINE) 50 MG tablet Take 1 tablet (50 mg total) by mouth 2 (two) times daily. 10/29/15  Yes Leonie Man, MD  labetalol (NORMODYNE) 200 MG tablet Take 100 mg by mouth 2 (two) times daily.    Yes Historical Provider, MD  losartan-hydrochlorothiazide (HYZAAR) 100-25 MG per tablet Take 1 tablet by mouth every morning.    Yes Historical Provider, MD  metFORMIN (GLUCOPHAGE) 1000 MG tablet Take 1 tablet by mouth 2 (two) times daily. 07/25/14  Yes Historical Provider, MD  nitroGLYCERIN (NITROSTAT) 0.4 MG SL tablet Place 1 tablet (0.4 mg total) under the tongue every 5 (five) minutes as needed for chest pain (up to 3 doses). 06/19/14  Yes Dayna N Dunn, PA-C     Family History  Problem Relation Age of Onset  . Heart Problems Father 88    Social History   Social History  . Marital status: Married    Spouse name: [name]  . Number of children: 1  . Years of education: N/A   Occupational History  .  Retired   Social History Main Topics  . Smoking status: Former Smoker    Types: Cigarettes    Quit date: 05/27/1957  . Smokeless tobacco: Never Used  . Alcohol use No  . Drug use: No  . Sexual activity: Not Asked   Other Topics Concern  . None   Social History Narrative   Married father of one.  Previously uses stationary bike routinely.  Now reduced due to other social stressors.   Quit smoking 50 years ago.  Does not drink alcohol     Review of Systems: A 12 point ROS discussed and pertinent positives are indicated in the HPI above.  All other systems are negative.  Review of Systems  Constitutional: Positive for activity change. Negative for fatigue and fever.  Respiratory: Negative for shortness of breath.   Gastrointestinal:  Positive for abdominal pain.  Genitourinary: Positive for decreased urine volume, difficulty urinating, dysuria, frequency and urgency.  Skin:       See picture of penis Urethral stenosis  Psychiatric/Behavioral: Negative for behavioral problems and confusion.    Vital Signs: BP 183/90 (BP Location: Right Arm)   Pulse 77   Temp 97.8 F (36.6 C) (Oral)   Resp 18   Ht 6\' 1"  (1.854 m)   Wt 210 lb (95.3 kg)   SpO2 100%   BMI 27.71 kg/m   Physical Exam  Constitutional: He is oriented to person, place, and time. He appears well-nourished.  Cardiovascular: Normal rate, regular rhythm and normal heart sounds.   Pulmonary/Chest: Effort normal and breath sounds normal.  Abdominal: Soft.  Musculoskeletal: Normal range of motion.  Neurological: He is alert and  oriented to person, place, and time.  Skin: Skin is warm and dry.  Psychiatric: He has a normal mood and affect. His behavior is normal. Judgment and thought content normal.  Vitals reviewed.   Mallampati Score:  MD Evaluation Airway: WNL Heart: WNL Abdomen: WNL Chest/ Lungs: WNL Mallampati/Airway Score: One  Imaging: No results found.  Labs:  CBC:  Recent Labs  01/24/15 0254 01/25/15 0135 01/26/15 0523 12/16/15 0230  WBC 6.4 10.7* 6.9 10.5  HGB 12.2* 11.4* 11.1* 12.8*  HCT 36.6* 34.4* 33.0* 40.0  PLT 333 333 317 410*    COAGS:  Recent Labs  01/22/15 0853 01/24/15 0254  INR 1.07 1.08  APTT 29 28    BMP:  Recent Labs  01/23/15 0033 01/24/15 0254 01/25/15 0135 05/30/15 1455 12/16/15 0230  NA 138 137 137  --  138  K 3.8 3.8 3.6  --  4.0  CL 104 105 106  --  103  CO2 26 26 24   --  26  GLUCOSE 117* 127* 167*  --  127*  BUN 17 19 16   --  24*  CALCIUM 9.2 8.7* 8.1*  --  9.4  CREATININE 1.04 0.98 1.03 1.17 1.07  GFRNONAA >60 >60 >60 56* >60  GFRAA >60 >60 >60 >60 >60    LIVER FUNCTION TESTS:  Recent Labs  01/22/15 0853  BILITOT 0.7  AST 19  ALT 15*  ALKPHOS 52  PROT 6.1*  ALBUMIN  3.9    TUMOR MARKERS: No results for input(s): AFPTM, CEA, CA199, CHROMGRNA in the last 8760 hours.  Assessment and Plan:  Urethral stenosis Urinary retention Scheduled for suprapubic catheter placement Risks and Benefits discussed with the patient including bleeding, infection, damage to adjacent structures, bowel perforation/fistula connection, and sepsis. All of the patient's questions were answered, patient is agreeable to proceed. Consent signed and in chart.  Thank you for this interesting consult.  I greatly enjoyed meeting KEAKA POSADAS and look forward to participating in their care.  A copy of this report was sent to the requesting provider on this date.  Electronically Signed: Quinlynn Cuthbert A 12/16/2015, 9:06 AM   I spent a total of 40 Minutes    in face to face in clinical consultation, greater than 50% of which was counseling/coordinating care for suprapubic catheter placement

## 2015-12-16 NOTE — ED Notes (Signed)
Pt back from IR procedure. Supra pubic catheter in place. Pt has no complaints at this time, reports 0/10 pain. MD would like to monitor patient until 3 pm. Report given to RN Carlis Abbott.

## 2015-12-16 NOTE — ED Notes (Signed)
Pt calling out to nurses station stating "I can't take this pain, it is too severe, I can't even get one drop out." MD Jacubowitz notified and given verbal order for 100 mcg of fentanyl. IR to be called to see approximately what time patient will be able to come over for procedure.

## 2015-12-16 NOTE — ED Notes (Signed)
Per Shanon Brow, RN and Horton, MD foley will not be placed in ED due to pts anatomy and difficulty in the past of inserting a foley. Horton, MD will consult Urology.

## 2015-12-16 NOTE — Sedation Documentation (Signed)
Pt writhing in pain. MD at bedside. Pt vitals stable

## 2015-12-16 NOTE — Sedation Documentation (Signed)
Pt resting well at this time. Applied NRB, O2 saturation decreasing

## 2015-12-16 NOTE — Discharge Instructions (Signed)
Call the [facility] office tomorrow to arrange to be seen this week.

## 2015-12-16 NOTE — Sedation Documentation (Signed)
Patient is resting comfortably. 

## 2015-12-16 NOTE — Sedation Documentation (Signed)
Pt states that he feels so much better since catheter has been placed

## 2015-12-16 NOTE — ED Notes (Signed)
Ebony Hail - Transporter, transporting pt to IR.

## 2015-12-18 LAB — URINE CULTURE

## 2015-12-19 ENCOUNTER — Telehealth (HOSPITAL_BASED_OUTPATIENT_CLINIC_OR_DEPARTMENT_OTHER): Payer: Self-pay | Admitting: *Deleted

## 2015-12-19 NOTE — Telephone Encounter (Signed)
Post ED Visit - Positive Culture Follow-up  Culture report reviewed by antimicrobial stewardship pharmacist:  []  Elenor Quinones, Pharm.D. [x]  Heide Guile, Pharm.D., BCPS []  Parks Neptune, Pharm.D. []  Alycia Rossetti, Pharm.D., BCPS []  [place], Pharm.D., BCPS, AAHIVP []  Legrand Como, Pharm.D., BCPS, AAHIVP []  Milus Glazier, Pharm.D. []  Stephens November, Pharm.D.  Positive proteus mirabilis urine culture Treated with cephalexin  organism sensitive to the same and no further patient follow-up is required at this time.  Demeka Sutter, Philis N[place] 12/19/2015, 12:10 PM

## 2015-12-23 ENCOUNTER — Other Ambulatory Visit: Payer: Self-pay | Admitting: Cardiology

## 2015-12-23 ENCOUNTER — Ambulatory Visit (INDEPENDENT_AMBULATORY_CARE_PROVIDER_SITE_OTHER): Payer: Medicare Other | Admitting: Neurology

## 2015-12-23 ENCOUNTER — Encounter: Payer: Self-pay | Admitting: Neurology

## 2015-12-23 VITALS — BP 150/82 | HR 66 | Resp 20 | Ht 73.0 in | Wt 215.0 lb

## 2015-12-23 DIAGNOSIS — E669 Obesity, unspecified: Secondary | ICD-10-CM

## 2015-12-23 DIAGNOSIS — I4891 Unspecified atrial fibrillation: Secondary | ICD-10-CM | POA: Diagnosis not present

## 2015-12-23 DIAGNOSIS — E118 Type 2 diabetes mellitus with unspecified complications: Secondary | ICD-10-CM

## 2015-12-23 DIAGNOSIS — E785 Hyperlipidemia, unspecified: Secondary | ICD-10-CM

## 2015-12-23 DIAGNOSIS — I10 Essential (primary) hypertension: Secondary | ICD-10-CM | POA: Diagnosis not present

## 2015-12-23 DIAGNOSIS — I63232 Cerebral infarction due to unspecified occlusion or stenosis of left carotid arteries: Secondary | ICD-10-CM

## 2015-12-23 DIAGNOSIS — Z9861 Coronary angioplasty status: Secondary | ICD-10-CM | POA: Diagnosis not present

## 2015-12-23 DIAGNOSIS — I9789 Other postprocedural complications and disorders of the circulatory system, not elsewhere classified: Secondary | ICD-10-CM

## 2015-12-23 MED ORDER — CLOPIDOGREL BISULFATE 75 MG PO TABS
37.5000 mg | ORAL_TABLET | ORAL | 9 refills | Status: DC
Start: 2015-12-23 — End: 2016-04-16

## 2015-12-23 NOTE — Patient Instructions (Addendum)
-   Continue ASA and plavix for stroke prevention - check BP at home, goal 120-150 - check glucose at home - Follow up with primary care physician for stroke risk factor modification. Recommend maintain blood pressure goal 120-150, diabetes with hemoglobin A1c goal below 6.5% and lipids with LDL cholesterol goal below 70 mg/dL.  - continue to follow up with Dr. Estanislado Pandy this Thursday and repeat MRA of the brain.  - use brace at night for right hand - exercise with right hand at home. - follow up with urology for foley catheter - may consider botox injection for right thumb cramping if too bothersome - follow up in 6 months.

## 2015-12-23 NOTE — Progress Notes (Signed)
STROKE NEUROLOGY FOLLOW UP NOTE  NAME: [name] DOB: April 29, 1931  REASON FOR VISIT: stroke follow up HISTORY FROM: pt and chart  Today we had [name] pleasure of seeing [name] in follow-up at our Neurology Clinic. Pt was accompanied by no one.   History Summary [name] is a 82 y.o. male left handed with history of CAD, HTN, DM, HLD was admitted on 01/22/15 for 2 day history of right sided weakness. 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 CTA head and neck showed no thrombosis distal to left petrous ICA stenosis. Pt had no hypotension episodes but hypertension in 180s. Pt had left MCA stroke in 2013 and had dizziness episode in 2015. Combination of [name] fact of recurrent left MCA stroke and chronic HTN running in 180s, [name] underwent cerebral angio with angioplasty, no stent required. Repeat MRA post procedure showed stenosis improved to 50%. A1C 7.8 and LDL 61. [name] was discharged with dual antiplatelet and crestor.   03/21/15 follow up - [name] has been doing well. No recurrent stroke like symptoms. [name] BP today 155/78. [name] glucose at home about 100-140. [name] still has right hand and wrist weakness and [name] is continued on PT/OT. [name] was taking crestor 5mg  twice a week but was later discontinued by PCP. Otherwise [name] is doing well without complains. [name] has appointment with Dr. Estanislado Pandy to repeat angio.  06/24/15 follow-up - pt has been doing well, no stroke like symptom. [name] had follow up with Dr. Estanislado Pandy in 05/2014, and had repeat MRA showed stable 50% residue stenosis of [name] left petrous ICA, and stable 50% left supraclinoid ICA stenosis. P2Y12 has not been checked yet. Right wrist extension and flexion much improved, still has difficulty of right hand. [name] complains of right hand pain intermittently, relieved by hand brace. BP 133/72.  Interval History During [name] interval time, [name] has been doing  well, no stroke like symptoms. [name] has appointment with Dr. Estanislado Pandy this week. Still has right hand weakness and spasticity, no pain, currently doing home exercises. [name] had urinary retention, went to ER, was put on 40 catheter, is going to follow-up with urology as outpatient on 12/31/2015. BP 150/82.  REVIEW OF SYSTEMS: Full 14 system review of systems performed and notable only for those listed below and in HPI above, all others are negative:  Constitutional:   Cardiovascular:  Ear/Nose/Throat:   Skin:  Eyes:   Respiratory:   Gastroitestinal:   Genitourinary:  Hematology/Lymphatic:   Endocrine:  Musculoskeletal:  cramps Allergy/Immunology:   Neurological:   Psychiatric:  Sleep:   [name] following represents [name]'s updated allergies and side effects list: Allergies  Allergen Reactions  . Atorvastatin     Myalgias  . Crestor [Rosuvastatin]   . Other Other (See Comments)    No  BLOOD PRODUCTS - Pt is Jeohovah's Witness  . Pravastatin     Myalgias  . Simvastatin     Myalgias     [name] neurologically relevant items on [name]'s problem list were reviewed on today's visit.  Neurologic Examination  A problem focused neurological exam (12 or more points of [name] single system neurologic examination, vital signs counts as 1 point, cranial nerves count for 8 points) was performed.  Blood pressure (!) 150/82, pulse 66, resp. rate 20, height 6\' 1"  (1.854 m), weight 215 lb (97.5 kg).  General - Well nourished, well developed, in no apparent  distress.  Ophthalmologic - Fundi not visualized due to eye movement.  Cardiovascular - Regular rate and rhythm.  Mental Status -  Level of arousal and orientation to time, place, and person were intact. Language including expression, naming, repetition, comprehension was assessed and found intact. Fund of Knowledge was assessed and was intact.  Cranial Nerves II - XII - II - Visual field intact OU. III, IV, VI - Extraocular  movements intact. V - Facial sensation intact bilaterally. VII - Facial movement intact bilaterally. VIII - Hearing & vestibular intact bilaterally. X - Palate elevates symmetrically. XI - Chin turning & shoulder shrug intact bilaterally. XII - Tongue protrusion intact.  Motor Strength - [name]'s strength was normal in all extremities except right wrist 5-/5 extension and 4/5  flexion, right hand 3-/5 grip, and pronator drift was absent.  Bulk was normal and fasciculations were absent.   Motor Tone - Muscle tone was assessed at [name] neck and appendages and was normal.  Reflexes - [name]'s reflexes were 1+ in all extremities and [name] had no pathological reflexes.  Sensory - Light touch, temperature/pinprick were assessed and were decreased at right hand, 50% of left hand.    Coordination - [name] had normal movements in [name] hands and feet with no ataxia or dysmetria.  Tremor was absent.  Gait and Station - slow cautious broad-based gait but steady.  Data reviewed: I personally reviewed [name] images and agree with [name] radiology interpretations.  Dg Chest 2 View 01/22/2015 No active cardiopulmonary disease.   Ct Head Wo Contrast 01/22/2015 1. No acute intracranial pathology. 2. Chronic microvascular disease and cerebral atrophy.   Ct Angio Head & Neck W/cm &/or Wo/cm 01/22/2015 High-grade stenosis of [name] skullbase LEFT ICA at [name] junction of [name] horizontal petrous and inferior cavernous segments estimated 90%. No intraluminal filling defect to suggest thrombus. [name] MR finding of a possible filling defect in [name] LEFT ICA relates to turbulent flow. Chronically occluded RIGHT vertebral artery. Greater than 50% stenosis proximal LEFT ICA, not clearly flow reducing. This tandem lesion does further compromise flow to [name] LEFT anterior circulation.   Mri & Mra Head Wo Contrast 01/22/2015 Punctate foci of acute infarctions scattered throughout [name] anterior circulation  distribution on [name] left consistent with micro embolic infarctions. Some of these could be watershed. Chronic small vessel ischemic changes elsewhere throughout [name] brain. Old left parietal cortical infarction. Severe focal stenosis of [name] left petrous carotid, 90% or greater. Possible filling defect distal to that which could be intraluminal thrombus or signal loss from severe turbulence. Stenoses in [name] anterior circulation intracranial branches, left worse than right. On [name] left, these could be atherosclerotic stenoses or conceivably due to nonocclusive embolic disease. Chronically occluded right vertebral.   05/30/15 1. No acute intracranial abnormality. 2. Chronic infarcts and small vessel ischemic disease as above. 3. Unchanged head MRA. Less than 50% residual stenosis of [name] left petrous ICA. 4. 50% left supraclinoid ICA stenosis. 5. Mild distal right M1 and moderate to severe left MCA bifurcation stenoses. 6. Moderate left A1 ACA stenosis and chronic left A2 occlusion. 7. Mild mid basilar artery stenosis. 8. Moderate to severe bilateral PCA branch vessel stenoses.  2D echo - - Left ventricle: [name] cavity size was normal. There was moderate concentric hypertrophy. Systolic function was normal. [name] estimated ejection fraction was in [name] range of 60% to 65%. Wall motion was normal; there were no regional wall motion abnormalities. Doppler parameters are consistent with abnormal left ventricular relaxation (  grade 1 diastolic dysfunction). - Aortic valve: Trileaflet; normal thickness leaflets. - Aortic root: [name] aortic root was normal in size. - Left atrium: [name] atrium was mildly dilated. - Right ventricle: Systolic function was normal. - Right atrium: [name] atrium was normal in size. - Tricuspid valve: There was mild regurgitation. - Pulmonic valve: There was no regurgitation. - Pulmonary arteries: Systolic pressure was within [name] normal range. - Inferior vena cava: [name]  vessel was normal in size. - Pericardium, extracardiac: There was no pericardial effusion.  Cerebral angio - S/P LT ICA intracranial angioplasty for severe symptomatic stenosis.with <20 % stenosis post angioplasty.   Component     Latest Ref Rng 06/27/2014 01/22/2015 01/23/2015 01/24/2015  Cholesterol     0 - 200 mg/dL  110    Triglycerides     <150 mg/dL  24    HDL Cholesterol     >40 mg/dL  44    Total CHOL/HDL Ratio       2.5    VLDL     0 - 40 mg/dL  5    LDL (calc)     0 - 99 mg/dL  61    Hemoglobin A1C     4.8 - 5.6 %  7.8 (H)    Mean Plasma Glucose       177    TSH     0.350 - 4.500 uIU/mL 2.651     HIV     Non Reactive   Non Reactive   Platelet Function  P2Y12     194 - 418 PRU    148 (L)    Assessment: As you may recall, [name] is a 82 y.o. 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 CTA head and neck showed no thrombosis distal to left petrous ICA stenosis. Pt had left MCA stroke in 2013 and had dizziness episode in 2015. Combination of [name] fact of recurrent left MCA stroke and chronic HTN running in 180s, [name] underwent cerebral angio with angioplasty, no stent required. Repeat MRA post procedure showed stenosis improved to 50%. A1C 7.8 and LDL 61. [name] was discharged with dual antiplatelet and crestor. During [name] interval time, [name] has been doing well. No recurrent stroke like symptoms. [name] was taking crestor 5mg  twice a week but was later discontinued by PCP. [name] followed up with Dr. Estanislado Pandy and repeat MRA in 05/2015 showed stable left ICA petrous 50% and supraclinoid 50% stenosis. [name] going to see Dr Estanislado Pandy this Thursday and likely repeat MRI of [name] brain. Complaint of right some cramping without pain, so far able to tolerate.  Plan:  - Continue ASA and plavix for stroke prevention - check BP at home, goal 120-150 -  check glucose at home - Follow up with primary care physician for stroke risk factor modification. Recommend maintain blood pressure goal 120-150, diabetes with hemoglobin A1c goal below 6.5% and lipids with LDL cholesterol goal below 70 mg/dL.  - continue to follow up with Dr. Estanislado Pandy this Thursday and repeat MRA of [name] brain.  - use brace at night for right hand - exercise with right hand at home. - follow up with urology for foley catheter - may consider botox injection for right thumb cramping if too bothersome - follow up in 6 months.  I spent more than 25 minutes of face to face time with  [name]. Greater than 50% of time was spent in counseling and coordination of care. We discuss about follow-up with MRA head, right hand home exercises and control of cramping.   No orders of [name] defined types were placed in this encounter.   No orders of [name] defined types were placed in this encounter.   [name] Instructions  - Continue ASA and plavix for stroke prevention - check BP at home, goal 120-150 - check glucose at home - Follow up with primary care physician for stroke risk factor modification. Recommend maintain blood pressure goal 120-150, diabetes with hemoglobin A1c goal below 6.5% and lipids with LDL cholesterol goal below 70 mg/dL.  - continue to follow up with Dr. Estanislado Pandy this Thursday and repeat MRA of [name] brain.  - use brace at night for right hand - exercise with right hand at home. - follow up with urology for foley catheter - may consider botox injection for right thumb cramping if too bothersome - follow up in 6 months.   Rosalin Hawking, MD PhD [facility] [address] [place], [place] [address] [phone]

## 2015-12-26 ENCOUNTER — Ambulatory Visit (HOSPITAL_COMMUNITY): Payer: Medicare Other

## 2015-12-26 ENCOUNTER — Ambulatory Visit (HOSPITAL_COMMUNITY): Admission: RE | Admit: 2015-12-26 | Payer: Medicare Other | Source: Ambulatory Visit

## 2015-12-26 ENCOUNTER — Ambulatory Visit (HOSPITAL_COMMUNITY)
Admission: RE | Admit: 2015-12-26 | Discharge: 2015-12-26 | Disposition: A | Discharge status: Home / Self Care | Payer: Medicare Other | Source: Ambulatory Visit | Attending: Interventional Radiology | Admitting: Interventional Radiology

## 2015-12-26 DIAGNOSIS — I6522 Occlusion and stenosis of left carotid artery: Secondary | ICD-10-CM | POA: Insufficient documentation

## 2015-12-26 DIAGNOSIS — I672 Cerebral atherosclerosis: Secondary | ICD-10-CM | POA: Diagnosis not present

## 2015-12-26 DIAGNOSIS — I771 Stricture of artery: Secondary | ICD-10-CM

## 2015-12-26 MED ORDER — GADOBENATE DIMEGLUMINE 529 MG/ML IV SOLN
20.0000 mL | Freq: Once | INTRAVENOUS | Status: AC | PRN
  Administered 2015-12-26: 20 mL via INTRAVENOUS

## 2015-12-31 DIAGNOSIS — R3914 Feeling of incomplete bladder emptying: Secondary | ICD-10-CM | POA: Diagnosis not present

## 2016-01-07 ENCOUNTER — Other Ambulatory Visit (HOSPITAL_COMMUNITY): Payer: Self-pay | Admitting: Interventional Radiology

## 2016-01-07 DIAGNOSIS — I771 Stricture of artery: Secondary | ICD-10-CM

## 2016-01-17 DIAGNOSIS — N35010 Post-traumatic urethral stricture, male, meatal: Secondary | ICD-10-CM | POA: Diagnosis not present

## 2016-01-17 DIAGNOSIS — N35013 Post-traumatic anterior urethral stricture: Secondary | ICD-10-CM | POA: Diagnosis not present

## 2016-01-21 ENCOUNTER — Ambulatory Visit (HOSPITAL_COMMUNITY)
Admission: RE | Admit: 2016-01-21 | Discharge: 2016-01-21 | Disposition: A | Discharge status: Home / Self Care | Payer: Medicare Other | Source: Ambulatory Visit | Attending: Interventional Radiology | Admitting: Interventional Radiology

## 2016-01-21 DIAGNOSIS — I771 Stricture of artery: Secondary | ICD-10-CM

## 2016-01-21 DIAGNOSIS — I6522 Occlusion and stenosis of left carotid artery: Secondary | ICD-10-CM | POA: Diagnosis not present

## 2016-01-21 HISTORY — PX: IR GENERIC HISTORICAL: IMG1180011

## 2016-01-22 ENCOUNTER — Other Ambulatory Visit: Payer: Self-pay | Admitting: Urology

## 2016-01-22 DIAGNOSIS — N35010 Post-traumatic urethral stricture, male, meatal: Secondary | ICD-10-CM

## 2016-01-24 ENCOUNTER — Encounter (HOSPITAL_COMMUNITY): Payer: Self-pay | Admitting: Interventional Radiology

## 2016-02-03 ENCOUNTER — Ambulatory Visit (HOSPITAL_COMMUNITY)
Admission: RE | Admit: 2016-02-03 | Discharge: 2016-02-03 | Disposition: A | Discharge status: Home / Self Care | Payer: Medicare Other | Source: Ambulatory Visit | Attending: Urology | Admitting: Urology

## 2016-02-03 ENCOUNTER — Encounter (HOSPITAL_COMMUNITY): Payer: Self-pay | Admitting: Interventional Radiology

## 2016-02-03 DIAGNOSIS — Z436 Encounter for attention to other artificial openings of urinary tract: Secondary | ICD-10-CM | POA: Insufficient documentation

## 2016-02-03 DIAGNOSIS — N35010 Post-traumatic urethral stricture, male, meatal: Secondary | ICD-10-CM

## 2016-02-03 HISTORY — PX: IR GENERIC HISTORICAL: IMG1180011

## 2016-02-03 MED ORDER — IOPAMIDOL (ISOVUE-300) INJECTION 61%
50.0000 mL | Freq: Once | INTRAVENOUS | Status: AC | PRN
  Administered 2016-02-03: 5 mL

## 2016-02-03 MED ORDER — LIDOCAINE HCL 1 % IJ SOLN
INTRAMUSCULAR | Status: DC | PRN
  Administered 2016-02-03: 5 mL via INTRADERMAL

## 2016-02-03 MED ORDER — LIDOCAINE HCL 1 % IJ SOLN
INTRAMUSCULAR | Status: AC
  Filled 2016-02-03: qty 20

## 2016-03-13 DIAGNOSIS — N182 Chronic kidney disease, stage 2 (mild): Secondary | ICD-10-CM | POA: Diagnosis not present

## 2016-03-13 DIAGNOSIS — E7800 Pure hypercholesterolemia, unspecified: Secondary | ICD-10-CM | POA: Diagnosis not present

## 2016-03-13 DIAGNOSIS — E1129 Type 2 diabetes mellitus with other diabetic kidney complication: Secondary | ICD-10-CM | POA: Diagnosis not present

## 2016-03-17 DIAGNOSIS — E1129 Type 2 diabetes mellitus with other diabetic kidney complication: Secondary | ICD-10-CM | POA: Diagnosis not present

## 2016-03-17 DIAGNOSIS — I69359 Hemiplegia and hemiparesis following cerebral infarction affecting unspecified side: Secondary | ICD-10-CM | POA: Diagnosis not present

## 2016-03-17 DIAGNOSIS — Z9359 Other cystostomy status: Secondary | ICD-10-CM | POA: Diagnosis not present

## 2016-03-17 DIAGNOSIS — I10 Essential (primary) hypertension: Secondary | ICD-10-CM | POA: Diagnosis not present

## 2016-03-17 DIAGNOSIS — I2510 Atherosclerotic heart disease of native coronary artery without angina pectoris: Secondary | ICD-10-CM | POA: Diagnosis not present

## 2016-03-17 DIAGNOSIS — Z6829 Body mass index (BMI) 29.0-29.9, adult: Secondary | ICD-10-CM | POA: Diagnosis not present

## 2016-03-26 ENCOUNTER — Encounter (HOSPITAL_COMMUNITY): Payer: Self-pay | Admitting: Emergency Medicine

## 2016-03-26 ENCOUNTER — Encounter (HOSPITAL_COMMUNITY)
Admission: EM | Disposition: A | Discharge status: Home / Self Care | Payer: Self-pay | Source: Home / Self Care | Attending: Internal Medicine

## 2016-03-26 ENCOUNTER — Inpatient Hospital Stay (HOSPITAL_COMMUNITY)
Admission: EM | Admit: 2016-03-26 | Discharge: 2016-03-28 | DRG: 247 | Disposition: A | Discharge status: Home / Self Care | Payer: Medicare Other | Attending: Internal Medicine | Admitting: Internal Medicine

## 2016-03-26 ENCOUNTER — Emergency Department (HOSPITAL_COMMUNITY): Payer: Medicare Other

## 2016-03-26 ENCOUNTER — Inpatient Hospital Stay (HOSPITAL_COMMUNITY): Payer: Medicare Other

## 2016-03-26 DIAGNOSIS — E1169 Type 2 diabetes mellitus with other specified complication: Secondary | ICD-10-CM | POA: Diagnosis present

## 2016-03-26 DIAGNOSIS — Z7982 Long term (current) use of aspirin: Secondary | ICD-10-CM | POA: Diagnosis not present

## 2016-03-26 DIAGNOSIS — N179 Acute kidney failure, unspecified: Secondary | ICD-10-CM | POA: Diagnosis not present

## 2016-03-26 DIAGNOSIS — I25119 Atherosclerotic heart disease of native coronary artery with unspecified angina pectoris: Secondary | ICD-10-CM | POA: Diagnosis present

## 2016-03-26 DIAGNOSIS — I25719 Atherosclerosis of autologous vein coronary artery bypass graft(s) with unspecified angina pectoris: Secondary | ICD-10-CM | POA: Diagnosis not present

## 2016-03-26 DIAGNOSIS — Z6830 Body mass index (BMI) 30.0-30.9, adult: Secondary | ICD-10-CM | POA: Diagnosis not present

## 2016-03-26 DIAGNOSIS — I213 ST elevation (STEMI) myocardial infarction of unspecified site: Secondary | ICD-10-CM

## 2016-03-26 DIAGNOSIS — Z8673 Personal history of transient ischemic attack (TIA), and cerebral infarction without residual deficits: Secondary | ICD-10-CM

## 2016-03-26 DIAGNOSIS — E785 Hyperlipidemia, unspecified: Secondary | ICD-10-CM | POA: Diagnosis present

## 2016-03-26 DIAGNOSIS — I10 Essential (primary) hypertension: Secondary | ICD-10-CM | POA: Diagnosis present

## 2016-03-26 DIAGNOSIS — Z95820 Peripheral vascular angioplasty status with implants and grafts: Secondary | ICD-10-CM

## 2016-03-26 DIAGNOSIS — Z951 Presence of aortocoronary bypass graft: Secondary | ICD-10-CM

## 2016-03-26 DIAGNOSIS — I2119 ST elevation (STEMI) myocardial infarction involving other coronary artery of inferior wall: Secondary | ICD-10-CM | POA: Diagnosis not present

## 2016-03-26 DIAGNOSIS — Z79899 Other long term (current) drug therapy: Secondary | ICD-10-CM

## 2016-03-26 DIAGNOSIS — E118 Type 2 diabetes mellitus with unspecified complications: Secondary | ICD-10-CM | POA: Diagnosis not present

## 2016-03-26 DIAGNOSIS — I25810 Atherosclerosis of coronary artery bypass graft(s) without angina pectoris: Secondary | ICD-10-CM | POA: Diagnosis not present

## 2016-03-26 DIAGNOSIS — I2111 ST elevation (STEMI) myocardial infarction involving right coronary artery: Secondary | ICD-10-CM

## 2016-03-26 DIAGNOSIS — I252 Old myocardial infarction: Secondary | ICD-10-CM | POA: Diagnosis present

## 2016-03-26 DIAGNOSIS — Z955 Presence of coronary angioplasty implant and graft: Secondary | ICD-10-CM | POA: Diagnosis not present

## 2016-03-26 DIAGNOSIS — G5601 Carpal tunnel syndrome, right upper limb: Secondary | ICD-10-CM | POA: Diagnosis present

## 2016-03-26 DIAGNOSIS — T82855A Stenosis of coronary artery stent, initial encounter: Secondary | ICD-10-CM | POA: Diagnosis present

## 2016-03-26 DIAGNOSIS — G4730 Sleep apnea, unspecified: Secondary | ICD-10-CM | POA: Diagnosis present

## 2016-03-26 DIAGNOSIS — R079 Chest pain, unspecified: Secondary | ICD-10-CM | POA: Diagnosis not present

## 2016-03-26 DIAGNOSIS — N480 Leukoplakia of penis: Secondary | ICD-10-CM | POA: Diagnosis present

## 2016-03-26 DIAGNOSIS — Z531 Procedure and treatment not carried out because of patient's decision for reasons of belief and group pressure: Secondary | ICD-10-CM | POA: Diagnosis present

## 2016-03-26 DIAGNOSIS — I714 Abdominal aortic aneurysm, without rupture: Secondary | ICD-10-CM | POA: Diagnosis not present

## 2016-03-26 DIAGNOSIS — I6529 Occlusion and stenosis of unspecified carotid artery: Secondary | ICD-10-CM | POA: Diagnosis not present

## 2016-03-26 DIAGNOSIS — Z87891 Personal history of nicotine dependence: Secondary | ICD-10-CM | POA: Diagnosis not present

## 2016-03-26 DIAGNOSIS — I25709 Atherosclerosis of coronary artery bypass graft(s), unspecified, with unspecified angina pectoris: Secondary | ICD-10-CM | POA: Diagnosis present

## 2016-03-26 DIAGNOSIS — E669 Obesity, unspecified: Secondary | ICD-10-CM | POA: Diagnosis present

## 2016-03-26 DIAGNOSIS — Z7984 Long term (current) use of oral hypoglycemic drugs: Secondary | ICD-10-CM

## 2016-03-26 DIAGNOSIS — Z7902 Long term (current) use of antithrombotics/antiplatelets: Secondary | ICD-10-CM | POA: Diagnosis not present

## 2016-03-26 DIAGNOSIS — R0789 Other chest pain: Secondary | ICD-10-CM | POA: Diagnosis not present

## 2016-03-26 HISTORY — DX: Unspecified osteoarthritis, unspecified site: M19.90

## 2016-03-26 HISTORY — DX: Presence of other specified devices: Z97.8

## 2016-03-26 HISTORY — DX: Presence of urogenital implants: Z96.0

## 2016-03-26 HISTORY — DX: Type 2 diabetes mellitus without complications: E11.9

## 2016-03-26 HISTORY — DX: Acute kidney failure, unspecified: N17.9

## 2016-03-26 HISTORY — PX: CARDIAC CATHETERIZATION: SHX172

## 2016-03-26 LAB — TROPONIN I
TROPONIN I: 0.09 ng/mL — AB (ref <0.03)
Troponin I: 1.41 ng/mL (ref <0.03)
Troponin I: 4.83 ng/mL (ref <0.03)
Troponin I: 17.86 ng/mL (ref <0.03)

## 2016-03-26 LAB — COMPREHENSIVE METABOLIC PANEL
ALT: 13 U/L — ABNORMAL LOW (ref 17–63)
AST: 18 U/L (ref 15–41)
Albumin: 3.9 g/dL (ref 3.5–5.0)
Alkaline Phosphatase: 61 U/L (ref 38–126)
Anion gap: 11 (ref 5–15)
BUN: 29 mg/dL — ABNORMAL HIGH (ref 6–20)
CHLORIDE: 102 mmol/L (ref 101–111)
CO2: 23 mmol/L (ref 22–32)
CREATININE: 1.14 mg/dL (ref 0.61–1.24)
Calcium: 9.3 mg/dL (ref 8.9–10.3)
GFR, EST NON AFRICAN AMERICAN: 58 mL/min — AB (ref >60)
Glucose, Bld: 242 mg/dL — ABNORMAL HIGH (ref 65–99)
POTASSIUM: 4.1 mmol/L (ref 3.5–5.1)
Sodium: 136 mmol/L (ref 135–145)
Total Bilirubin: 0.6 mg/dL (ref 0.3–1.2)
Total Protein: 6.6 g/dL (ref 6.5–8.1)

## 2016-03-26 LAB — POCT I-STAT, CHEM 8
BUN: 30 mg/dL — AB (ref 6–20)
CALCIUM ION: 1.23 mmol/L (ref 1.15–1.40)
Chloride: 101 mmol/L (ref 101–111)
Creatinine, Ser: 1 mg/dL (ref 0.61–1.24)
GLUCOSE: 224 mg/dL — AB (ref 65–99)
HCT: 38 % — ABNORMAL LOW (ref 39.0–52.0)
Hemoglobin: 12.9 g/dL — ABNORMAL LOW (ref 13.0–17.0)
Potassium: 4 mmol/L (ref 3.5–5.1)
SODIUM: 138 mmol/L (ref 135–145)
TCO2: 24 mmol/L (ref 0–100)

## 2016-03-26 LAB — CBC
HCT: 36.9 % — ABNORMAL LOW (ref 39.0–52.0)
HCT: 36.6 % — ABNORMAL LOW (ref 39.0–52.0)
HEMATOCRIT: 37.5 % — AB (ref 39.0–52.0)
HEMOGLOBIN: 12.6 g/dL — AB (ref 13.0–17.0)
HEMOGLOBIN: 12.1 g/dL — AB (ref 13.0–17.0)
Hemoglobin: 12 g/dL — ABNORMAL LOW (ref 13.0–17.0)
MCH: 29.8 pg (ref 26.0–34.0)
MCH: 28.9 pg (ref 26.0–34.0)
MCH: 28.8 pg (ref 26.0–34.0)
MCHC: 33.6 g/dL (ref 30.0–36.0)
MCHC: 32.5 g/dL (ref 30.0–36.0)
MCHC: 33.1 g/dL (ref 30.0–36.0)
MCV: 88.7 fL (ref 78.0–100.0)
MCV: 88.9 fL (ref 78.0–100.0)
MCV: 87.1 fL (ref 78.0–100.0)
PLATELETS: 423 10*3/uL — AB (ref 150–400)
PLATELETS: 413 10*3/uL — AB (ref 150–400)
Platelets: 428 10*3/uL — ABNORMAL HIGH (ref 150–400)
RBC: 4.23 MIL/uL (ref 4.22–5.81)
RBC: 4.15 MIL/uL — AB (ref 4.22–5.81)
RBC: 4.2 MIL/uL — ABNORMAL LOW (ref 4.22–5.81)
RDW: 15 % (ref 11.5–15.5)
RDW: 15 % (ref 11.5–15.5)
RDW: 14.8 % (ref 11.5–15.5)
WBC: 8.8 10*3/uL (ref 4.0–10.5)
WBC: 8.3 10*3/uL (ref 4.0–10.5)
WBC: 8.1 10*3/uL (ref 4.0–10.5)

## 2016-03-26 LAB — LIPID PANEL
CHOLESTEROL: 152 mg/dL (ref 0–200)
HDL: 47 mg/dL (ref >40)
LDL CALC: 89 mg/dL (ref 0–99)
Total CHOL/HDL Ratio: 3.2 RATIO
Triglycerides: 80 mg/dL (ref <150)
VLDL: 16 mg/dL (ref 0–40)

## 2016-03-26 LAB — MRSA PCR SCREENING: MRSA by PCR: NEGATIVE

## 2016-03-26 LAB — ECHOCARDIOGRAM COMPLETE
AOASC: 45 cm
AVLVOTPG: 14 mmHg
CHL CUP DOP CALC LVOT VTI: 39.4 cm
CHL CUP MV DEC (S): 430
EWDT: 430 ms
FS: 29 % (ref 28–44)
Height: 71 in
IV/PV OW: 1.67
LA diam end sys: 41 mm
LA vol: 35.6 mL
LADIAMINDEX: 1.81 cm/m2
LASIZE: 41 mm
LAVOLA4C: 34.3 mL
LAVOLIN: 15.7 mL/m2
LV TDI E'LATERAL: 9.03
LV TDI E'MEDIAL: 6.64
LVELAT: 9.03 cm/s
LVOT SV: 164 mL
LVOT area: 4.15 cm2
LVOT diameter: 23 mm
LVOT peak vel: 188 cm/s
MVPKEVEL: 0.4 m/s
PW: 12 mm — AB (ref 0.6–1.1)
TAPSE: 19.1 mm
Weight: 3520 oz

## 2016-03-26 LAB — PROTIME-INR
INR: 1
INR: 1.09
PROTHROMBIN TIME: 14.1 s (ref 11.4–15.2)
Prothrombin Time: 13.2 seconds (ref 11.4–15.2)

## 2016-03-26 LAB — BASIC METABOLIC PANEL
ANION GAP: 10 (ref 5–15)
BUN: 28 mg/dL — ABNORMAL HIGH (ref 6–20)
CO2: 25 mmol/L (ref 22–32)
Calcium: 9.6 mg/dL (ref 8.9–10.3)
Chloride: 102 mmol/L (ref 101–111)
Creatinine, Ser: 1.12 mg/dL (ref 0.61–1.24)
GFR calc Af Amer: >60 mL/min (ref >60)
GFR, EST NON AFRICAN AMERICAN: 59 mL/min — AB (ref >60)
Glucose, Bld: 184 mg/dL — ABNORMAL HIGH (ref 65–99)
POTASSIUM: 3.8 mmol/L (ref 3.5–5.1)
SODIUM: 137 mmol/L (ref 135–145)

## 2016-03-26 LAB — POCT ACTIVATED CLOTTING TIME: Activated Clotting Time: 345 seconds

## 2016-03-26 LAB — CREATININE, SERUM
CREATININE: 1.03 mg/dL (ref 0.61–1.24)
GFR calc Af Amer: >60 mL/min (ref >60)

## 2016-03-26 LAB — POCT I-STAT TROPONIN I: Troponin i, poc: 0.1 ng/mL (ref 0.00–0.08)

## 2016-03-26 LAB — GLUCOSE, CAPILLARY
GLUCOSE-CAPILLARY: 198 mg/dL — AB (ref 65–99)
GLUCOSE-CAPILLARY: 196 mg/dL — AB (ref 65–99)
GLUCOSE-CAPILLARY: 236 mg/dL — AB (ref 65–99)
Glucose-Capillary: 254 mg/dL — ABNORMAL HIGH (ref 65–99)

## 2016-03-26 LAB — CK TOTAL AND CKMB (NOT AT ARMC)
CK TOTAL: 89 U/L (ref 49–397)
CK, MB: 4.9 ng/mL (ref 0.5–5.0)
Relative Index: INVALID (ref 0.0–2.5)

## 2016-03-26 LAB — APTT: aPTT: 33 seconds (ref 24–36)

## 2016-03-26 SURGERY — LEFT HEART CATH AND CORONARY ANGIOGRAPHY
Anesthesia: LOCAL

## 2016-03-26 MED ORDER — LOSARTAN POTASSIUM 50 MG PO TABS
100.0000 mg | ORAL_TABLET | Freq: Every day | ORAL | Status: DC
  Administered 2016-03-26 – 2016-03-28 (×3): 100 mg via ORAL
  Filled 2016-03-26 (×2): qty 2

## 2016-03-26 MED ORDER — HEPARIN (PORCINE) IN NACL 2-0.9 UNIT/ML-% IJ SOLN
INTRAMUSCULAR | Status: AC
  Filled 2016-03-26: qty 500

## 2016-03-26 MED ORDER — IOPAMIDOL (ISOVUE-370) INJECTION 76%
INTRAVENOUS | Status: AC
  Filled 2016-03-26: qty 100

## 2016-03-26 MED ORDER — INSULIN ASPART 100 UNIT/ML ~~LOC~~ SOLN
0.0000 [IU] | Freq: Three times a day (TID) | SUBCUTANEOUS | Status: DC
  Administered 2016-03-26: 8 [IU] via SUBCUTANEOUS
  Administered 2016-03-27: 2 [IU] via SUBCUTANEOUS
  Administered 2016-03-27: 11 [IU] via SUBCUTANEOUS

## 2016-03-26 MED ORDER — VERAPAMIL HCL 2.5 MG/ML IV SOLN
INTRAVENOUS | Status: DC | PRN
  Administered 2016-03-26: 300 ug via INTRACORONARY
  Administered 2016-03-26: 200 ug via INTRACORONARY

## 2016-03-26 MED ORDER — NITROGLYCERIN IN D5W 200-5 MCG/ML-% IV SOLN: 2.0000 ug/min | INTRAVENOUS | Status: DC

## 2016-03-26 MED ORDER — BIVALIRUDIN BOLUS VIA INFUSION - CUPID
INTRAVENOUS | Status: DC | PRN
  Administered 2016-03-26: 74.85 mg via INTRAVENOUS

## 2016-03-26 MED ORDER — IOPAMIDOL (ISOVUE-370) INJECTION 76%
INTRAVENOUS | Status: AC
  Filled 2016-03-26: qty 50

## 2016-03-26 MED ORDER — INSULIN ASPART 100 UNIT/ML ~~LOC~~ SOLN
0.0000 [IU] | Freq: Every day | SUBCUTANEOUS | Status: DC
  Administered 2016-03-26: 2 [IU] via SUBCUTANEOUS

## 2016-03-26 MED ORDER — SODIUM CHLORIDE 0.9 % IV SOLN
INTRAVENOUS | Status: DC | PRN
  Administered 2016-03-26: 10 mL/h via INTRAVENOUS

## 2016-03-26 MED ORDER — LIDOCAINE HCL (PF) 1 % IJ SOLN
INTRAMUSCULAR | Status: DC | PRN
  Administered 2016-03-26: 2 mL via SUBCUTANEOUS

## 2016-03-26 MED ORDER — ONDANSETRON HCL 4 MG/2ML IJ SOLN: 4.0000 mg | Freq: Four times a day (QID) | INTRAMUSCULAR | Status: DC | PRN

## 2016-03-26 MED ORDER — HEPARIN SODIUM (PORCINE) 5000 UNIT/ML IJ SOLN
5000.0000 [IU] | Freq: Three times a day (TID) | INTRAMUSCULAR | Status: DC
  Administered 2016-03-26 – 2016-03-28 (×5): 5000 [IU] via SUBCUTANEOUS
  Filled 2016-03-26 (×5): qty 1

## 2016-03-26 MED ORDER — ATROPINE SULFATE 1 MG/10ML IJ SOSY
PREFILLED_SYRINGE | INTRAMUSCULAR | Status: AC
  Filled 2016-03-26: qty 10

## 2016-03-26 MED ORDER — SODIUM CHLORIDE 0.9 % IV SOLN: 250.0000 mL | INTRAVENOUS | Status: DC | PRN

## 2016-03-26 MED ORDER — CLOPIDOGREL BISULFATE 75 MG PO TABS: 37.5000 mg | ORAL_TABLET | Freq: Every day | ORAL | Status: DC

## 2016-03-26 MED ORDER — BIVALIRUDIN 250 MG IV SOLR
INTRAVENOUS | Status: AC
  Filled 2016-03-26: qty 250

## 2016-03-26 MED ORDER — LABETALOL HCL 100 MG PO TABS
100.0000 mg | ORAL_TABLET | Freq: Two times a day (BID) | ORAL | Status: DC
  Administered 2016-03-26 – 2016-03-27 (×3): 100 mg via ORAL
  Filled 2016-03-26 (×5): qty 1

## 2016-03-26 MED ORDER — CLOPIDOGREL BISULFATE 75 MG PO TABS
75.0000 mg | ORAL_TABLET | Freq: Every day | ORAL | Status: DC
  Administered 2016-03-27 – 2016-03-28 (×2): 75 mg via ORAL
  Filled 2016-03-26 (×2): qty 1

## 2016-03-26 MED ORDER — LIDOCAINE HCL (PF) 1 % IJ SOLN
INTRAMUSCULAR | Status: AC
  Filled 2016-03-26: qty 30

## 2016-03-26 MED ORDER — SODIUM CHLORIDE 0.9% FLUSH: 3.0000 mL | INTRAVENOUS | Status: DC | PRN

## 2016-03-26 MED ORDER — VERAPAMIL HCL 2.5 MG/ML IV SOLN
INTRAVENOUS | Status: AC
  Filled 2016-03-26: qty 2

## 2016-03-26 MED ORDER — NITROGLYCERIN 1 MG/10 ML FOR IR/CATH LAB
INTRA_ARTERIAL | Status: DC | PRN
  Administered 2016-03-26: 200 ug via INTRACORONARY

## 2016-03-26 MED ORDER — ASPIRIN 81 MG PO CHEW
81.0000 mg | CHEWABLE_TABLET | Freq: Every day | ORAL | Status: DC
  Administered 2016-03-27 – 2016-03-28 (×2): 81 mg via ORAL
  Filled 2016-03-26 (×2): qty 1

## 2016-03-26 MED ORDER — CLOPIDOGREL BISULFATE 300 MG PO TABS
ORAL_TABLET | ORAL | Status: AC
  Filled 2016-03-26: qty 2

## 2016-03-26 MED ORDER — TIROFIBAN HCL IN NACL 5-0.9 MG/100ML-% IV SOLN
INTRAVENOUS | Status: AC
  Filled 2016-03-26: qty 100

## 2016-03-26 MED ORDER — ACETAMINOPHEN 325 MG PO TABS: 650.0000 mg | ORAL_TABLET | ORAL | Status: DC | PRN

## 2016-03-26 MED ORDER — CEPHALEXIN 500 MG PO CAPS
500.0000 mg | ORAL_CAPSULE | Freq: Three times a day (TID) | ORAL | Status: DC
  Filled 2016-03-26: qty 1

## 2016-03-26 MED ORDER — HYDRALAZINE HCL 50 MG PO TABS
50.0000 mg | ORAL_TABLET | Freq: Two times a day (BID) | ORAL | Status: DC
  Administered 2016-03-26 – 2016-03-28 (×5): 50 mg via ORAL
  Filled 2016-03-26 (×5): qty 1

## 2016-03-26 MED ORDER — NITROGLYCERIN IN D5W 200-5 MCG/ML-% IV SOLN
INTRAVENOUS | Status: DC | PRN
  Administered 2016-03-26: 10 ug/min via INTRAVENOUS

## 2016-03-26 MED ORDER — HYDROCHLOROTHIAZIDE 25 MG PO TABS
25.0000 mg | ORAL_TABLET | Freq: Every day | ORAL | Status: DC
  Filled 2016-03-26: qty 1

## 2016-03-26 MED ORDER — HEPARIN (PORCINE) IN NACL 2-0.9 UNIT/ML-% IJ SOLN
INTRAMUSCULAR | Status: DC | PRN
  Administered 2016-03-26: 1500 mL

## 2016-03-26 MED ORDER — NITROGLYCERIN 1 MG/10 ML FOR IR/CATH LAB
INTRA_ARTERIAL | Status: AC
  Filled 2016-03-26: qty 10

## 2016-03-26 MED ORDER — HEPARIN (PORCINE) IN NACL 2-0.9 UNIT/ML-% IJ SOLN
INTRAMUSCULAR | Status: AC
  Filled 2016-03-26: qty 1000

## 2016-03-26 MED ORDER — HYDROCHLOROTHIAZIDE 25 MG PO TABS
25.0000 mg | ORAL_TABLET | Freq: Every day | ORAL | Status: DC
  Administered 2016-03-26 – 2016-03-28 (×3): 25 mg via ORAL
  Filled 2016-03-26 (×2): qty 1

## 2016-03-26 MED ORDER — SODIUM CHLORIDE 0.9 % IV SOLN: INTRAVENOUS | Status: AC

## 2016-03-26 MED ORDER — LOSARTAN POTASSIUM-HCTZ 100-25 MG PO TABS: 1.0000 | ORAL_TABLET | Freq: Every morning | ORAL | Status: DC

## 2016-03-26 MED ORDER — HEPARIN SODIUM (PORCINE) 5000 UNIT/ML IJ SOLN
INTRAMUSCULAR | Status: AC
  Administered 2016-03-26: 4000 [IU] via INTRAVENOUS
  Filled 2016-03-26: qty 1

## 2016-03-26 MED ORDER — LOSARTAN POTASSIUM 50 MG PO TABS
100.0000 mg | ORAL_TABLET | Freq: Every day | ORAL | Status: DC
  Filled 2016-03-26: qty 2

## 2016-03-26 MED ORDER — IOPAMIDOL (ISOVUE-370) INJECTION 76%
INTRAVENOUS | Status: AC
  Filled 2016-03-26: qty 125

## 2016-03-26 MED ORDER — SODIUM CHLORIDE 0.9% FLUSH
3.0000 mL | Freq: Two times a day (BID) | INTRAVENOUS | Status: DC
  Administered 2016-03-26 – 2016-03-27 (×2): 3 mL via INTRAVENOUS

## 2016-03-26 MED ORDER — BIVALIRUDIN 250 MG IV SOLR
INTRAVENOUS | Status: DC | PRN
  Administered 2016-03-26: 1.75 mg/kg/h via INTRAVENOUS

## 2016-03-26 MED ORDER — NITROGLYCERIN IN D5W 200-5 MCG/ML-% IV SOLN
INTRAVENOUS | Status: AC
  Filled 2016-03-26: qty 250

## 2016-03-26 MED ORDER — IOPAMIDOL (ISOVUE-370) INJECTION 76%
INTRAVENOUS | Status: DC | PRN
  Administered 2016-03-26: 290 mL via INTRA_ARTERIAL

## 2016-03-26 MED ORDER — NITROGLYCERIN 0.4 MG SL SUBL: 0.4000 mg | SUBLINGUAL_TABLET | SUBLINGUAL | Status: DC | PRN

## 2016-03-26 MED ORDER — AMLODIPINE BESYLATE 10 MG PO TABS
10.0000 mg | ORAL_TABLET | Freq: Every evening | ORAL | Status: DC
  Administered 2016-03-26 – 2016-03-27 (×2): 10 mg via ORAL
  Filled 2016-03-26 (×2): qty 1

## 2016-03-26 MED ORDER — CLOPIDOGREL BISULFATE 300 MG PO TABS
ORAL_TABLET | ORAL | Status: DC | PRN
  Administered 2016-03-26: 600 mg via ORAL

## 2016-03-26 MED ORDER — TIROFIBAN (AGGRASTAT) BOLUS VIA INFUSION
INTRAVENOUS | Status: DC | PRN
  Administered 2016-03-26: 2495 ug via INTRAVENOUS

## 2016-03-26 SURGICAL SUPPLY — 24 items
BALLN EUPHORA RX 1.5X6 (BALLOONS) ×2
BALLN EUPHORA RX 2.5X12 (BALLOONS) ×2
BALLN ~~LOC~~ EUPHORA RX 3.0X12 (BALLOONS) ×2
BALLOON EUPHORA RX 1.5X6 (BALLOONS) ×1 IMPLANT
BALLOON EUPHORA RX 2.5X12 (BALLOONS) ×1 IMPLANT
BALLOON ~~LOC~~ EUPHORA RX 3.0X12 (BALLOONS) ×1 IMPLANT
CATH INFINITI 5 FR IM (CATHETERS) ×2 IMPLANT
CATH INFINITI 5FR MULTPACK ANG (CATHETERS) ×2 IMPLANT
DEVICE SPIDERFX EMB PROT 5MM (WIRE) ×2 IMPLANT
GUIDE CATH RUNWAY 6FR MP1 (CATHETERS) ×2 IMPLANT
GUIDEWIRE ANGLED .035X150CM (WIRE) ×2 IMPLANT
KIT ENCORE 26 ADVANTAGE (KITS) ×2 IMPLANT
KIT HEART LEFT (KITS) ×2 IMPLANT
PACK CARDIAC CATHETERIZATION (CUSTOM PROCEDURE TRAY) ×2 IMPLANT
SET INTRODUCER MICROPUNCT 5F (INTRODUCER) ×2 IMPLANT
SHEATH PINNACLE 6F 10CM (SHEATH) ×2 IMPLANT
STENT PROMUS PREM MR 3.0X16 (Permanent Stent) ×2 IMPLANT
TRANSDUCER W/STOPCOCK (MISCELLANEOUS) ×2 IMPLANT
TUBING CIL FLEX 10 FLL-RA (TUBING) ×2 IMPLANT
WIRE ASAHI GRAND SLAM 180CM (WIRE) ×2 IMPLANT
WIRE ASAHI PROWATER 180CM (WIRE) ×4 IMPLANT
WIRE EMERALD 3MM-J .035X260CM (WIRE) ×2 IMPLANT
WIRE EMERALD ST .035X150CM (WIRE) ×2 IMPLANT
WIRE RUNTHROUGH .014X180CM (WIRE) ×2 IMPLANT

## 2016-03-26 NOTE — Progress Notes (Signed)
  Echocardiogram 2D Echocardiogram has been performed.  Bobbye Charleston 03/26/2016, 11:53 AM

## 2016-03-26 NOTE — H&P (Signed)
Physician History and Physical    [name] MRN: [id number] DOB/AGE: 02/02/1931 82 y.o. Admit date: 03/26/2016  Primary Cardiologist: Ellyn Hack  HPI: 82 yo with history of CABG x 2 (LIMA-LAD and SVG-PDA) in 7/12 as well as prior CVA presented with infero-lateral STEMI this morning.  His last procedure was DES to SVG-PDA in 2/16.  Overnight tonight, he developed 6-7/10 central chest tightness.  This did not resolve so he called EMS.  ST elevation noted in inferior leads and V5-V6.  In the ER, he has 3/10 chest pain after NTG.  Yesterday, he felt fatigued but no chest pain. No overt GI bleeding, rare hematuria.   Past Medical History:  Diagnosis Date  . Abdominal aortic aneurysm ([place])    a. Aortic duplex 06/2014: mild aneurysmal dilatation of proximal abdominal aorta measuring 3.4x3.4cm. No sig change from 2012. F/u due 06/2016.  . 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 CAD in native artery    a. NSTEMI 11/2010 - CABG x2(LIMA to LAD, SVG to PDA-11/2010). b. NEGATIVE Lexiscan Myoview 6/9/'15, EF 53%, no perfusion abnormality, septal and apical HK noted. C. NSTEMI 05/2014 - s/p DES to SVG-RPDA 06/18/14 (Xience Alpine DES 3.0 x 18 mm -3.35 mm), EF 60-65%.  . Dizziness    a. Carotid duplex 03/2014: mild fibrous plaque, no significant stenosis.  Marland Kitchen HTN (hypertension)   . Hyperlipidemia LDL goal <70   . Non-STEMI (non-ST elevated myocardial infarction) [facility]) July 2012   1. Ostial LAD 70% (to close to [facility] for PCI), subtotal occlusion of the RCA  . Postoperative atrial fibrillation (HCC)    Post CABG, no sign recurrence  . Refusal of blood transfusions as patient is Jehovah's Witness   . 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 Servando Snare)  . Sleep apnea    Not on CPAP.  Marland Kitchen Stroke [facility]) 2014; 01/2015   a. 2014 with mild right hand weakness, nonhemorrhagic per pt.;; b. - PTA-Stent L ICA 95%   . Type II diabetes mellitus with complication (HCC)      Review of systems complete and found to be negative unless listed above   Family History  Problem Relation Age of Onset  . Heart Problems Father 31    Social History   Social History  . Marital status: Married    Spouse name: [name]  . Number of children: 1  . Years of education: N/A   Occupational History  .  Retired   Social History Main Topics  . Smoking status: Former Smoker    Types: Cigarettes    Quit date: 05/27/1957  . Smokeless tobacco: Never Used  . Alcohol use No  . Drug use: No  . Sexual activity: Not on file   Other Topics Concern  . Not on file   Social History Narrative   Married father of one.  Previously uses stationary bike routinely.  Now reduced due to other social stressors.   Quit smoking 50 years ago.  Does not drink alcohol      (Not in a hospital admission)   No current facility-administered medications for this encounter.    Current Outpatient Prescriptions  Medication Sig Dispense Refill  . amLODipine (NORVASC) 10 MG tablet Take 10 mg by mouth every evening.     Marland Kitchen aspirin 325 MG tablet Take 1 tablet (325 mg total) by mouth daily with breakfast.    . cephALEXin (KEFLEX) 500 MG capsule  Take 1 capsule (500 mg total) by mouth 3 (three) times daily. 20 capsule 0  . clopidogrel (PLAVIX) 75 MG tablet Take 0.5 tablets (37.5 mg total) by mouth every other day. 15 tablet 9  . glimepiride (AMARYL) 4 MG tablet Take 4 mg by mouth 2 (two) times daily with a meal.     . hydrALAZINE (APRESOLINE) 50 MG tablet Take 1 tablet (50 mg total) by mouth 2 (two) times daily. 180 tablet 1  . labetalol (NORMODYNE) 200 MG tablet Take 100 mg by mouth 2 (two) times daily.     Marland Kitchen losartan-hydrochlorothiazide (HYZAAR) 100-25 MG per tablet Take 1 tablet by mouth every morning.     . metFORMIN (GLUCOPHAGE) 1000 MG tablet Take 1 tablet by mouth 2 (two) times daily.    . nitroGLYCERIN (NITROSTAT) 0.4 MG SL tablet Place 1 tablet (0.4 mg total) under the tongue every 5 (five)  minutes as needed for chest pain (up to 3 doses). 25 tablet 3     Physical Exam: Height 5\' 11"  (1.803 m), weight 220 lb (99.8 kg).  General: NAD Neck: No JVD, no thyromegaly or thyroid nodule.  Lungs: Clear to auscultation bilaterally with normal respiratory effort. CV: Nondisplaced PMI.  Heart regular S1/S2, no S3/S4, 1/6 SEM RUSB.  Trace ankle edema.  No carotid bruit.  Normal pedal pulses.  Abdomen: Soft, nontender, no hepatosplenomegaly, no distention.  Skin: Intact without lesions or rashes.  Neurologic: Alert and oriented x 3.  Psych: Normal affect. Extremities: No clubbing or cyanosis.  HEENT: Normal.   Labs: Pending  EKG: NSR, 1 mm ST elevation inferiorly and in V5-6, new compared to prior  ASSESSMENT AND PLAN: 82 yo with history of CABG x 2 (LIMA-LAD and SVG-PDA) in 7/12 as well as prior CVA presented with infero-lateral STEMI this morning.  1. CAD: Infero-lateral STEMI on ECG with chest pain.  Still some residual CP after NTG.  As above, had CABG in 2012.  Also had DES to SVG-PDA in 2/16.  He is, interestingly, on 37.5 mg every other day of Plavix at home.  - He will go to cath lab for emergent angiography.  2. CVA: Prior. Followed by neurology.   Signed: [name] 03/26/2016, 5:10 AM

## 2016-03-26 NOTE — ED Notes (Signed)
Cardiologists explained plan of care and procedure to pt.

## 2016-03-26 NOTE — Progress Notes (Signed)
Pt arrived in cath holding post STEMI/PCI. Awake, alert oriented. Denies pain.    2H is unable to take report or receive pt at this time. Room ECG monitor and Zoll with pads in use.

## 2016-03-26 NOTE — Care Management Note (Signed)
Case Management Note  Patient Details  Name: [name] MRN: [id number] Date of Birth: [birth year]  Subjective/Objective:    Adm w stemi                Action/Plan: will be on antiplatelet for 1 yr, lives w wife   Expected Discharge Date:                  Expected Discharge Plan:  Home/Self Care  In-House Referral:     Discharge planning Services     Post Acute Care Choice:    Choice offered to:     DME Arranged:    DME Agency:     HH Arranged:    [place] Agency:     Status of Service:  In process, will continue to follow  If discussed at Long Length of Stay Meetings, dates discussed:    Additional Comments: will moniter for dc needs as pt progresses  Lacretia Leigh, RN 03/26/2016, 9:38 AM

## 2016-03-26 NOTE — ED Triage Notes (Addendum)
Patient arrived with EMS from home woke up this morning ( 3am) with central chest pain , received 4 tabs ASA and 3 NTG sl prior to arrival with mild relief . History of CAD /CABG.

## 2016-03-26 NOTE — ED Provider Notes (Signed)
[facility] DEPT Provider Note   CSN: [id number] Arrival date & time: 03/26/16  0457     History   Chief Complaint Chief Complaint  Patient presents with  . Chest Pain    HPI [name] is a 82 y.o. male.  He was awakened at about 3:30 AM with retrosternal chest pain. He is unable to describe the pain but states was moderate in intensity rated at 6/10. There is no radiation of pain. There is minimal associated nausea. There is no dyspnea or diaphoresis. He called for EMS who have given him aspirin and nitroglycerin without any relief. He was given fentanyl. Pain is currently rated at 2/10. He has a history of coronary artery bypass. EMS transported him as a code STEMI.   The history is provided by the patient.  Chest Pain      Past Medical History:  Diagnosis Date  . Abdominal aortic aneurysm ([place])    a. Aortic duplex 06/2014: mild aneurysmal dilatation of proximal abdominal aorta measuring 3.4x3.4cm. No sig change from 2012. F/u due 06/2016.  . 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 CAD in native artery    a. NSTEMI 11/2010 - CABG x2(LIMA to LAD, SVG to PDA-11/2010). b. NEGATIVE Lexiscan Myoview 6/9/'15, EF 53%, no perfusion abnormality, septal and apical HK noted. C. NSTEMI 05/2014 - s/p DES to SVG-RPDA 06/18/14 (Xience Alpine DES 3.0 x 18 mm -3.35 mm), EF 60-65%.  . Dizziness    a. Carotid duplex 03/2014: mild fibrous plaque, no significant stenosis.  Marland Kitchen HTN (hypertension)   . Hyperlipidemia LDL goal <70   . Non-STEMI (non-ST elevated myocardial infarction) [facility]) July 2012   1. Ostial LAD 70% (to close to [facility] for PCI), subtotal occlusion of the RCA  . Postoperative atrial fibrillation (HCC)    Post CABG, no sign recurrence  . Refusal of blood transfusions as patient is Jehovah's Witness   . 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 Servando Snare)  . Sleep apnea    Not on CPAP.  Marland Kitchen Stroke [facility]) 2014; 01/2015   a. 2014 with mild  right hand weakness, nonhemorrhagic per pt.;; b. - PTA-Stent L ICA 95%   . Type II diabetes mellitus with complication [facility])     Patient Active Problem List   Diagnosis Date Noted  . Carpal tunnel syndrome of right wrist 06/24/2015  . History of stroke 03/21/2015  . Type 2 diabetes mellitus with circulatory disorder ([place]) 03/21/2015  . Stenosis of carotid artery   . Essential hypertension   . CVA (cerebral infarction) 01/22/2015  . CAD S/P percutaneous coronary angioplasty 09/19/2014  . Coronary artery disease involving coronary bypass graft of native heart with other forms of angina pectoris ([place]) 09/19/2014  . Shortness of breath 09/19/2014  . Polypharmacy 09/19/2014  . Abdominal aortic aneurysm ([place])   . Stroke ([place])   . Hyperlipidemia LDL goal <70   . Type II diabetes mellitus with complication ([place])   . Coronary artery disease involving native coronary artery   . Abnormal TSH 06/19/2014  . Obesity 06/19/2014  . Bilateral lower extremity edema 03/30/2014  . Dizziness 02/21/2014  . Bradycardia 08/31/2013  . Postoperative atrial fibrillation (HCC)     Class: Temporary  . Diabetes mellitus type 2 in obese ([place])   . Moderate essential hypertension   . Apnea, sleep   . Balanitis xerotica obliterans   . History of: Non-STEMI (non-ST elevated myocardial infarction)  11/16/2010    Class: History of  . S/P CABG x 2 11/16/2010    Past Surgical History:  Procedure Laterality Date  . CARDIAC CATHETERIZATION  12/11/2010   Dr. Chase Picket - subsequent cath - normal LV systolic function, no renal artery stenosis, severe 2-vessel disease with subtotaled RCA prox and distal 60% lesion and complex 70% area of narrowing of ostium of LAD  . CORONARY ANGIOPLASTY WITH STENT PLACEMENT  06/18/2014   PCI to SVG-RPDA 06/18/14 (Xience Alpine DES 3.0 x 18 mm -3.35 mm),   . CORONARY ARTERY BYPASS GRAFT  12/15/2010   X2, Dr Servando Snare; LIMA to LAD, SVG to PDA;   . CYSTOSCOPY WITH URETHRAL DILATATION      . IR GENERIC HISTORICAL  01/21/2016   IR RADIOLOGIST EVAL & MGMT 01/21/2016 [facility]-INTERV RAD  . IR GENERIC HISTORICAL  02/03/2016   IR CATHETER TUBE CHANGE 02/03/2016 Marybelle Killings, MD [facility]-INTERV RAD  . LEFT HEART CATHETERIZATION WITH CORONARY ANGIOGRAM N/A 06/18/2014   Procedure: LEFT HEART CATHETERIZATION WITH CORONARY ANGIOGRAM;  Surgeon: Leonie Man, MD;  Location: [facility] CATH LAB;  Service: Cardiovascular;  -- severe disease of SVG-rPDA  . RADIOLOGY WITH ANESTHESIA N/A 01/24/2015   Procedure: STENT ASSISTED ANGIOPLASTY (RADIOLOGY WITH ANESTHESIA);  Surgeon: Luanne Bras, MD;  Location: [facility];  Service: Radiology;  Laterality: N/A;  . TRANSTHORACIC ECHOCARDIOGRAM  07/28/2012; 03/25/2015:   a. EF 55-60%, severe Conc LVH, Nl Systolic fxn, G1 DD,  Ao Sclerosis w/o stenosis; CRO PFO;; b. Moderate concentric LVH. EF 60-65%. No RWMA, Gr 1 DD. Mild LA dilation       Home Medications    Prior to Admission medications   Medication Sig Start Date End Date Taking? Authorizing Provider  amLODipine (NORVASC) 10 MG tablet Take 10 mg by mouth every evening.     Historical Provider, MD  aspirin 325 MG tablet Take 1 tablet (325 mg total) by mouth daily with breakfast. 01/26/15   Rosalin Hawking, MD  cephALEXin (KEFLEX) 500 MG capsule Take 1 capsule (500 mg total) by mouth 3 (three) times daily. 12/16/15   Orlie Dakin, MD  clopidogrel (PLAVIX) 75 MG tablet Take 0.5 tablets (37.5 mg total) by mouth every other day. 12/23/15   Leonie Man, MD  glimepiride (AMARYL) 4 MG tablet Take 4 mg by mouth 2 (two) times daily with a meal.     Historical Provider, MD  hydrALAZINE (APRESOLINE) 50 MG tablet Take 1 tablet (50 mg total) by mouth 2 (two) times daily. 10/29/15   Leonie Man, MD  labetalol (NORMODYNE) 200 MG tablet Take 100 mg by mouth 2 (two) times daily.     Historical Provider, MD  losartan-hydrochlorothiazide (HYZAAR) 100-25 MG per tablet Take 1 tablet by mouth every morning.     Historical Provider, MD   metFORMIN (GLUCOPHAGE) 1000 MG tablet Take 1 tablet by mouth 2 (two) times daily. 07/25/14   Historical Provider, MD  nitroGLYCERIN (NITROSTAT) 0.4 MG SL tablet Place 1 tablet (0.4 mg total) under the tongue every 5 (five) minutes as needed for chest pain (up to 3 doses). 06/19/14   Charlie Pitter, PA-C    Family History Family History  Problem Relation Age of Onset  . Heart Problems Father 55    Social History Social History  Substance Use Topics  . Smoking status: Former Smoker    Types: Cigarettes    Quit date: 05/27/1957  . Smokeless tobacco: Never Used  . Alcohol use No     Allergies  Atorvastatin; Crestor [rosuvastatin]; Other; Pravastatin; and Simvastatin   Review of Systems Review of Systems  Cardiovascular: Positive for chest pain.  All other systems reviewed and are negative.    Physical Exam Updated Vital Signs BP (!) 141/78   Pulse 70   Temp 98.2 F (36.8 C) (Oral)   Resp (!) 23   Ht 5\' 11"  (1.803 m)   Wt 220 lb (99.8 kg)   SpO2 99%   BMI 30.68 kg/m   Physical Exam  Nursing note and vitals reviewed.  82 year old male, resting comfortably and in no acute distress. Vital signs are significant for hypertension. Oxygen saturation is 99%, which is normal. Head is normocephalic and atraumatic. PERRLA, EOMI. Oropharynx is clear. Neck is nontender and supple without adenopathy or JVD. Back is nontender and there is no CVA tenderness. Lungs are clear without rales, wheezes, or rhonchi. Chest is nontender. Midline sternotomy scar is present and well-healed. Heart has regular rate and rhythm without murmur. Abdomen is soft, flat, nontender without masses or hepatosplenomegaly and peristalsis is normoactive. Extremities have no cyanosis or edema, full range of motion is present. Skin is warm and dry without rash. Neurologic: Mental status is normal, cranial nerves are intact, there are no motor or sensory deficits.  ED Treatments / Results  Labs (all labs  ordered are listed, but only abnormal results are displayed) Labs Reviewed  BASIC METABOLIC PANEL - Abnormal; Notable for the following:       Result Value   Glucose, Bld 184 (*)    BUN 28 (*)    GFR calc non Af Amer 59 (*)    All other components within normal limits  CBC - Abnormal; Notable for the following:    Hemoglobin 12.6 (*)    HCT 37.5 (*)    Platelets 428 (*)    All other components within normal limits  TROPONIN I - Abnormal; Notable for the following:    Troponin I 0.09 (*)    All other components within normal limits  COMPREHENSIVE METABOLIC PANEL - Abnormal; Notable for the following:    Glucose, Bld 242 (*)    BUN 29 (*)    ALT 13 (*)    GFR calc non Af Amer 58 (*)    All other components within normal limits  CBC - Abnormal; Notable for the following:    RBC 4.15 (*)    Hemoglobin 12.0 (*)    HCT 36.9 (*)    Platelets 423 (*)    All other components within normal limits  GLUCOSE, CAPILLARY - Abnormal; Notable for the following:    Glucose-Capillary 198 (*)    All other components within normal limits  CBC - Abnormal; Notable for the following:    RBC 4.20 (*)    Hemoglobin 12.1 (*)    HCT 36.6 (*)    Platelets 413 (*)    All other components within normal limits  TROPONIN I - Abnormal; Notable for the following:    Troponin I 1.41 (*)    All other components within normal limits  TROPONIN I - Abnormal; Notable for the following:    Troponin I 4.83 (*)    All other components within normal limits  TROPONIN I - Abnormal; Notable for the following:    Troponin I 17.86 (*)    All other components within normal limits  GLUCOSE, CAPILLARY - Abnormal; Notable for the following:    Glucose-Capillary 196 (*)    All other components within normal limits  GLUCOSE, CAPILLARY - Abnormal; Notable for the following:    Glucose-Capillary 254 (*)    All other components within normal limits  GLUCOSE, CAPILLARY - Abnormal; Notable for the following:     Glucose-Capillary 236 (*)    All other components within normal limits  POCT I-STAT TROPONIN I - Abnormal; Notable for the following:    Troponin i, poc 0.10 (*)    All other components within normal limits  POCT I-STAT, CHEM 8 - Abnormal; Notable for the following:    BUN 30 (*)    Glucose, Bld 224 (*)    Hemoglobin 12.9 (*)    HCT 38.0 (*)    All other components within normal limits  MRSA PCR SCREENING  PROTIME-INR  CK TOTAL AND CKMB (NOT AT [facility])  PROTIME-INR  APTT  LIPID PANEL  CREATININE, SERUM  HEMOGLOBIN A1C  HEMOGLOBIN F8B  BASIC METABOLIC PANEL  CBC  I-STAT TROPOININ, ED  POCT ACTIVATED CLOTTING TIME    EKG  EKG Interpretation  Date/Time:  Thursday March 26 2016 05:02:22 EST Ventricular Rate:  74 PR Interval:    QRS Duration: 93 QT Interval:  390 QTC Calculation: 433 R Axis:   -19 Text Interpretation:  Sinus rhythm Prolonged PR interval Probable left atrial enlargement Inferior infarct, acute (RCA) Anteroseptal infarct, age indeterminate Lateral leads are also involved Probable RV involvement, suggest recording right precordial leads When compared with ECG of 01/22/2015, ** ** ACUTE MI / STEMI ** ** is now Present Confirmed by [facility]  MD, Akaash ([id number]) on 03/26/2016 5:13:05 AM       Radiology Dg Chest Portable 1 View  Result Date: 03/26/2016 CLINICAL DATA:  Central chest pain EXAM: PORTABLE CHEST 1 VIEW COMPARISON:  Chest radiograph 01/22/2015 FINDINGS: Persistent cardiomegaly. Shallow lung inflation without focal airspace consolidation or pulmonary edema. No pneumothorax or pleural effusion. IMPRESSION: Cardiomegaly. Shallow lung inflation without focal airspace disease. Electronically Signed   By: Ulyses Jarred M.D.   On: 03/26/2016 05:20    Procedures Procedures (including critical care time) CRITICAL CARE Performed by: WCHEN,IDPOE Total critical care time: 35 minutes Critical care time was exclusive of separately billable procedures and treating other  patients. Critical care was necessary to treat or prevent imminent or life-threatening deterioration. Critical care was time spent personally by me on the following activities: development of treatment plan with patient and/or surrogate as well as nursing, discussions with consultants, evaluation of patient's response to treatment, examination of patient, obtaining history from patient or surrogate, ordering and performing treatments and interventions, ordering and review of laboratory studies, ordering and review of radiographic studies, pulse oximetry and re-evaluation of patient's condition.  Medications Ordered in ED Medications  heparin 5000 UNIT/ML injection (4,000 Units Intravenous Given 03/26/16 0511)     Initial Impression / Assessment and Plan / ED Course  I have reviewed the triage vital signs and the nursing notes.  Pertinent labs & imaging results that were available during my care of the patient were reviewed by me and considered in my medical decision making (see chart for details).  Clinical Course    Chest pain in patient with known coronary artery disease status post CABG. ECG shows inferolateral ST elevation which was not present on old ECG, so he is given heparin in the ED. Dr. Saunders Revel and Dr. Aundra Dubin of come to the ED to take over his care. Old records are reviewed confirming two-vessel coronary artery bypass in 2012.  Final Clinical Impressions(s) / ED Diagnoses   Final diagnoses:  ST elevation myocardial infarction (STEMI), unspecified artery [facility])    New Prescriptions New Prescriptions   No medications on file     Delora Fuel, MD 43/14/27 [id number]

## 2016-03-26 NOTE — ED Notes (Signed)
4000 Heparin given

## 2016-03-26 NOTE — Progress Notes (Signed)
Patients troponin was elevated 17.86, denies any chest pain or discomfort. NTG drip was titrated down with stable blood pressure. Dr. Liborio Nixon was notified will continue to monitor patient.

## 2016-03-26 NOTE — Progress Notes (Signed)
Sheath Pull  Site right groin,  Site assessment prior to removal "0",  Manual pressure applied for 40 minutes.  Patient stable during procedure, VSS.  Post sheath groin assessment "1", small amount of superficial bruising, no hematoma.  Patient instructed to apply direct pressure to site when coughing, laughing, or sneezing and to keep head on pillow until bed rest is completed.  Distal pulses +2 post pull.  Pt tolerated procedure well.  Bed rest for 6 hours will end at 2100.    Darrel Reach, RN

## 2016-03-27 DIAGNOSIS — I213 ST elevation (STEMI) myocardial infarction of unspecified site: Secondary | ICD-10-CM

## 2016-03-27 LAB — GLUCOSE, CAPILLARY
GLUCOSE-CAPILLARY: 302 mg/dL — AB (ref 65–99)
GLUCOSE-CAPILLARY: 112 mg/dL — AB (ref 65–99)
GLUCOSE-CAPILLARY: 196 mg/dL — AB (ref 65–99)
Glucose-Capillary: 131 mg/dL — ABNORMAL HIGH (ref 65–99)

## 2016-03-27 LAB — HEMOGLOBIN A1C
HEMOGLOBIN A1C: 7.2 % — AB (ref 4.8–5.6)
HEMOGLOBIN A1C: 7.1 % — AB (ref 4.8–5.6)
Mean Plasma Glucose: 160 mg/dL
Mean Plasma Glucose: 157 mg/dL

## 2016-03-27 LAB — CBC
HEMATOCRIT: 37 % — AB (ref 39.0–52.0)
HEMOGLOBIN: 12.4 g/dL — AB (ref 13.0–17.0)
MCH: 29.2 pg (ref 26.0–34.0)
MCHC: 33.5 g/dL (ref 30.0–36.0)
MCV: 87.1 fL (ref 78.0–100.0)
Platelets: 421 10*3/uL — ABNORMAL HIGH (ref 150–400)
RBC: 4.25 MIL/uL (ref 4.22–5.81)
RDW: 15.2 % (ref 11.5–15.5)
WBC: 11 10*3/uL — ABNORMAL HIGH (ref 4.0–10.5)

## 2016-03-27 LAB — BASIC METABOLIC PANEL
ANION GAP: 10 (ref 5–15)
BUN: 29 mg/dL — ABNORMAL HIGH (ref 6–20)
CHLORIDE: 103 mmol/L (ref 101–111)
CO2: 23 mmol/L (ref 22–32)
Calcium: 9.2 mg/dL (ref 8.9–10.3)
Creatinine, Ser: 1.46 mg/dL — ABNORMAL HIGH (ref 0.61–1.24)
GFR calc non Af Amer: 43 mL/min — ABNORMAL LOW (ref >60)
GFR, EST AFRICAN AMERICAN: 49 mL/min — AB (ref >60)
GLUCOSE: 113 mg/dL — AB (ref 65–99)
Potassium: 3.6 mmol/L (ref 3.5–5.1)
Sodium: 136 mmol/L (ref 135–145)

## 2016-03-27 LAB — TROPONIN I: Troponin I: 18.98 ng/mL (ref <0.03)

## 2016-03-27 MED ORDER — EZETIMIBE 10 MG PO TABS
10.0000 mg | ORAL_TABLET | Freq: Every day | ORAL | Status: DC
  Administered 2016-03-27 – 2016-03-28 (×2): 10 mg via ORAL
  Filled 2016-03-27 (×2): qty 1

## 2016-03-27 MED FILL — Tirofiban HCl in NaCl 0.9% IV Soln 5 MG/100ML (Base Equiv): INTRAVENOUS | Qty: 100 | Status: AC

## 2016-03-27 NOTE — Progress Notes (Signed)
Hospital Problem List     Principal Problem:   STEMI (ST elevation myocardial infarction) [facility])     Patient Profile:   Primary Cardiologist: Dr. Ellyn Hack  82 yo with history of CABG x 2 (LIMA-LAD and SVG-PDA) in 7/12 as well as prior CVA presented with infero-lateral STEMI 03/26/16 morning  Subjective   Doing well this morning. Reports that overnight he had some left sided chest pain. Did not feel like his pain prior to arrival. Says it was more of a soreness, dull aching sensation. Resolved on its own within half an hour. No further chest pain. No shortness of breath, nausea, vomiting. Says he feels like a new man this morning.   Inpatient Medications    . amLODipine  10 mg Oral QPM  . aspirin  81 mg Oral Daily  . clopidogrel  75 mg Oral Daily  . heparin  5,000 Units Subcutaneous Q8H  . hydrALAZINE  50 mg Oral BID  . losartan  100 mg Oral Daily   And  . hydrochlorothiazide  25 mg Oral Daily  . insulin aspart  0-15 Units Subcutaneous TID WC  . insulin aspart  0-5 Units Subcutaneous QHS  . labetalol  100 mg Oral BID  . sodium chloride flush  3 mL Intravenous Q12H    Vital Signs    Vitals:   03/27/16 0600 03/27/16 0700 03/27/16 0747 03/27/16 0800  BP: 124/63 127/79 (!) 141/69 128/71  Pulse: (!) 56 (!) 59 (!) 59 (!) 58  Resp: 16 12 11 12   Temp:   98.4 F (36.9 C)   TempSrc:   Oral   SpO2: 97% 96% 96% 97%  Weight:      Height:        Intake/Output Summary (Last 24 hours) at 03/27/16 0910 Last data filed at 03/27/16 0300  Gross per 24 hour  Intake           775.37 ml  Output             2350 ml  Net         -1574.63 ml   Filed Weights   03/26/16 0500  Weight: 220 lb (99.8 kg)    Physical Exam    General: Well developed, well nourished, male appearing in no acute distress. Head: Normocephalic, atraumatic.  Neck: Supple without bruits, no JVD. Lungs:  Resp regular and unlabored, CTAB. Heart: RRR, S1, S2, no S3, S4, soft systolic murmur heard best at  RUSB, no rubs. Abdomen: Soft, non-tender, non-distended with normoactive bowel sounds. No hepatomegaly. No rebound/guarding. No obvious abdominal masses. Extremities: No clubbing, cyanosis, no edema. Distal pedal pulses are 2+ bilaterally. Neuro: Alert and oriented X 3. Moves all extremities spontaneously. Psych: Normal affect.  Labs    CBC  Recent Labs  03/26/16 1117 03/27/16 0230  WBC 8.1 11.0*  HGB 12.1* 12.4*  HCT 36.6* 37.0*  MCV 87.1 87.1  PLT 413* 341*   Basic Metabolic Panel  Recent Labs  03/26/16 0531 03/26/16 0533 03/26/16 1117 03/27/16 0230  NA 136 138  --  136  K 4.1 4.0  --  3.6  CL 102 101  --  103  CO2 23  --   --  23  GLUCOSE 242* 224*  --  113*  BUN 29* 30*  --  29*  CREATININE 1.14 1.00 1.03 1.46*  CALCIUM 9.3  --   --  9.2   Liver Function Tests  Recent Labs  03/26/16 0531  AST 18  ALT 13*  ALKPHOS 61  BILITOT 0.6  PROT 6.6  ALBUMIN 3.9   No results for input(s): LIPASE, AMYLASE in the last 72 hours. Cardiac Enzymes  Recent Labs  03/26/16 0531 03/26/16 1117 03/26/16 1502 03/26/16 2130  CKTOTAL 89  --   --   --   CKMB 4.9  --   --   --   TROPONINI  --  1.41* 4.83* 17.86*   BNP Invalid input(s): POCBNP D-Dimer No results for input(s): DDIMER in the last 72 hours. Hemoglobin A1C  Recent Labs  03/26/16 1117  HGBA1C 7.1*   Fasting Lipid Panel  Recent Labs  03/26/16 0531  CHOL 152  HDL 47  LDLCALC 89  TRIG 80  CHOLHDL 3.2   Thyroid Function Tests No results for input(s): TSH, T4TOTAL, T3FREE, THYROIDAB in the last 72 hours.  Invalid input(s): FREET3  Telemetry    NSR  ECG    NSR, improvement in ST elevations inferiorly and V5-V6. Persistent ST elevation in lead III. T-wave inversions present.    Cardiac Studies and Radiology    Dg Chest Portable 1 View  Result Date: 03/26/2016 CLINICAL DATA:  Central chest pain EXAM: PORTABLE CHEST 1 VIEW COMPARISON:  Chest radiograph 01/22/2015 FINDINGS: Persistent  cardiomegaly. Shallow lung inflation without focal airspace consolidation or pulmonary edema. No pneumothorax or pleural effusion. IMPRESSION: Cardiomegaly. Shallow lung inflation without focal airspace disease. Electronically Signed   By: Ulyses Jarred M.D.   On: 03/26/2016 05:20    Echocardiogram:  Study Conclusions  - Left ventricle: Suggestive of hypokinesis of the distal inferior   wall but the apical 2 chamber view is somewhat forshortened. The   cavity size was normal. There was moderate focal basal and mild   concentric hypertrophy. Systolic function was normal. The   estimated ejection fraction was in the range of 60% to 65%. Wall   motion was normal; there were no regional wall motion   abnormalities. There was an increased relative contribution of   atrial contraction to ventricular filling. Doppler parameters are   consistent with abnormal left ventricular relaxation (grade 1   diastolic dysfunction). - Aortic valve: Trileaflet; normal thickness, mildly calcified   leaflets. There was trivial regurgitation. - Aorta: Aortic root dimension: 46 mm (ED). Ascending aortic   diameter: 45 mm (S). - Aortic root: The aortic root was moderately dilated. - Ascending aorta: The ascending aorta was moderately dilated. - Mitral valve: There was trivial regurgitation. - Atrial septum: There was increased thickness of the septum,   consistent with lipomatous hypertrophy.  Assessment & Plan    CAD with Infero-lateral STEMI on ECG with chest pain yesterday morning. As above, had CABG in 2012.  Also had DES to SVG-PDA in 2/16.  He was on 37.5 mg every other day of Plavix at home. Went to cath lab yesterday morning. Had successful PCI to proximal SVG to PDA in-stent restenosis/thrombosis with DES.  Started on DAPT, ASA 81 mg and Plavix 75 mg daily. Will need for at least a year.  ECHO yesterday with EF 60-65%, no wall motion abnormalities. Grade 1 diastolic dysfunction.  Troponins continues  to rise after PCI yesterday. Up to 17.8 at 2130 last night, which was the last troponin drawn.  EKG this morning with NSR, improvement in ST elevations inferiorly and V5-V6. Persistent ST elevation in lead III. T-wave inversions present.  Lipid panel 11/9 with total cholesterol 152, LDL 89, HDL 47. Currently on no statin therapy. Has had statin intolerance to  multiple statins. May benefit from starting Zetia.  -Recheck Troponin today -Cardiac rehab  AKI: Cr 1.46 today, up from 1.0 on arrival. Likely 2/2 contrast load. IVF and re-check in am.  CVA: Prior. Followed by neurology.   SignedMaryellen Pile PGY2 IM Resident [phone]  9:10 AM 03/27/2016   Attending Note:   The patient was seen and examined.  Agree with assessment and plan as noted above.  Changes made to the above note as needed.  Patient seen and independently examined with Maryellen Pile, PGY2.   We discussed all aspects of the encounter. I agree with the assessment and plan as stated above.  1. CAd  - s/p PCI of the SVG to RCA His enzymes have continued to increase I suspect that he had embolization of atheroma down the graft.  Had some CP last night , feeling better   Will move to step down. Anticipate transfer to tele tomorrow and then home on Sunday   2. Hyperlipidemia:   Intol to statins. Will start Zetia  Consider lipid clinic eval as OP    I have spent a total of 40 minutes with patient reviewing hospital  notes , telemetry, EKGs, labs and examining patient as well as establishing an assessment and plan that was discussed with the patient. > 50% of time was spent in direct patient care.    Thayer Headings, Brooke Bonito., MD, [facility] 03/27/2016, 10:41 AM [address],  [place] Pager [phone]

## 2016-03-27 NOTE — Progress Notes (Signed)
Spoke with Cardiologist on call regarding scheduled Labetolol 100mg . Instructed to hold d/t PR interval 0.23 and rate 59-62. Will continue to monitor.

## 2016-03-27 NOTE — Progress Notes (Signed)
CARDIAC REHAB PHASE I   PRE:  Rate/Rhythm: 79 SR  BP:  Supine:   Sitting: 108/70  Standing:    SaO2: 98^RA  MODE:  Ambulation: 350 ft   POST:  Rate/Rhythm: 98 SR  BP:  Supine:   Sitting: 125/86  Standing:    SaO2: 98%RA 1335-1430 Pt walked 350 ft with hand held asst with fairly steady gait. A little wobbly when he got distracted. MI education completed with pt who voiced understanding. Stressed importance of plavix with stent. Discussed NTG use, ex ed, MI restrictions, CRP 2, and heart healthy diet. Pt does all the cooking for his wife, mother-in-law and himself. Discussed CRP 2 and will refer to [facility]. Has done program before.   Graylon Good, RN BSN  03/27/2016 2:23 PM

## 2016-03-28 ENCOUNTER — Encounter (HOSPITAL_COMMUNITY): Payer: Self-pay | Admitting: Cardiology

## 2016-03-28 DIAGNOSIS — N179 Acute kidney failure, unspecified: Secondary | ICD-10-CM

## 2016-03-28 DIAGNOSIS — Z95820 Peripheral vascular angioplasty status with implants and grafts: Secondary | ICD-10-CM

## 2016-03-28 DIAGNOSIS — I252 Old myocardial infarction: Secondary | ICD-10-CM | POA: Diagnosis present

## 2016-03-28 HISTORY — DX: Acute kidney failure, unspecified: N17.9

## 2016-03-28 LAB — CBC
HCT: 38.1 % — ABNORMAL LOW (ref 39.0–52.0)
Hemoglobin: 12.5 g/dL — ABNORMAL LOW (ref 13.0–17.0)
MCH: 29.1 pg (ref 26.0–34.0)
MCHC: 32.8 g/dL (ref 30.0–36.0)
MCV: 88.6 fL (ref 78.0–100.0)
PLATELETS: 430 10*3/uL — AB (ref 150–400)
RBC: 4.3 MIL/uL (ref 4.22–5.81)
RDW: 15.1 % (ref 11.5–15.5)
WBC: 8.2 10*3/uL (ref 4.0–10.5)

## 2016-03-28 LAB — BASIC METABOLIC PANEL
Anion gap: 10 (ref 5–15)
BUN: 30 mg/dL — AB (ref 6–20)
CALCIUM: 9 mg/dL (ref 8.9–10.3)
CO2: 23 mmol/L (ref 22–32)
CREATININE: 1.46 mg/dL — AB (ref 0.61–1.24)
Chloride: 102 mmol/L (ref 101–111)
GFR, EST AFRICAN AMERICAN: 49 mL/min — AB (ref >60)
GFR, EST NON AFRICAN AMERICAN: 43 mL/min — AB (ref >60)
Glucose, Bld: 255 mg/dL — ABNORMAL HIGH (ref 65–99)
Potassium: 4.1 mmol/L (ref 3.5–5.1)
SODIUM: 135 mmol/L (ref 135–145)

## 2016-03-28 LAB — GLUCOSE, CAPILLARY
GLUCOSE-CAPILLARY: 120 mg/dL — AB (ref 65–99)
GLUCOSE-CAPILLARY: 250 mg/dL — AB (ref 65–99)

## 2016-03-28 MED ORDER — EZETIMIBE 10 MG PO TABS: 10.0000 mg | ORAL_TABLET | Freq: Every day | ORAL | 6 refills | Status: DC

## 2016-03-28 MED ORDER — ASPIRIN 81 MG PO CHEW: 81.0000 mg | CHEWABLE_TABLET | Freq: Every day | ORAL | Status: DC

## 2016-03-28 MED ORDER — METFORMIN HCL 1000 MG PO TABS: 1000.0000 mg | ORAL_TABLET | Freq: Two times a day (BID) | ORAL | Status: DC

## 2016-03-28 NOTE — Progress Notes (Signed)
Patient ID: [name], male   DOB: [birth year], 82 y.o.   MRN: [id number]    Hospital Problem List     Principal Problem:   STEMI (ST elevation myocardial infarction) [facility])     Patient Profile:   Primary Cardiologist: Dr. Ellyn Hack  82 yo with history of CABG x 2 (LIMA-LAD and SVG-PDA) in 7/12 as well as prior CVA presented with infero-lateral STEMI 03/26/16 morning  Subjective   No chest pain overnight, feels good overall.  Ready to go home.  No labs today.   Inpatient Medications    . amLODipine  10 mg Oral QPM  . aspirin  81 mg Oral Daily  . clopidogrel  75 mg Oral Daily  . ezetimibe  10 mg Oral Daily  . heparin  5,000 Units Subcutaneous Q8H  . hydrALAZINE  50 mg Oral BID  . losartan  100 mg Oral Daily   And  . hydrochlorothiazide  25 mg Oral Daily  . insulin aspart  0-15 Units Subcutaneous TID WC  . insulin aspart  0-5 Units Subcutaneous QHS  . labetalol  100 mg Oral BID  . sodium chloride flush  3 mL Intravenous Q12H    Vital Signs    Vitals:   03/27/16 2048 03/27/16 2300 03/28/16 0300 03/28/16 0823  BP: (!) 146/71 137/77 (!) 146/78 122/69  Pulse: 63 64 62 (!) 58  Resp: 20 12 16 14   Temp: 98 F (36.7 C) 98.2 F (36.8 C) 98.3 F (36.8 C) 97.8 F (36.6 C)  TempSrc: Oral Oral Oral Oral  SpO2: 99% 97% 98% 94%  Weight:      Height:        Intake/Output Summary (Last 24 hours) at 03/28/16 1106 Last data filed at 03/28/16 0900  Gross per 24 hour  Intake              720 ml  Output              825 ml  Net             -105 ml   Filed Weights   03/26/16 0500  Weight: 220 lb (99.8 kg)    Physical Exam    General: Well developed, well nourished, male appearing in no acute distress. Head: Normocephalic, atraumatic.  Neck: Supple without bruits, no JVD. Lungs:  Resp regular and unlabored, CTAB. Heart: RRR, S1, S2, no S3, S4, soft systolic murmur heard best at RUSB, no rubs. Abdomen: Soft, non-tender, non-distended with normoactive bowel sounds. No  hepatomegaly. No rebound/guarding. No obvious abdominal masses. Extremities: No clubbing, cyanosis, no edema. Distal pedal pulses are 2+ bilaterally. Neuro: Alert and oriented X 3. Moves all extremities spontaneously. Psych: Normal affect.  Labs    CBC  Recent Labs  03/26/16 1117 03/27/16 0230  WBC 8.1 11.0*  HGB 12.1* 12.4*  HCT 36.6* 37.0*  MCV 87.1 87.1  PLT 413* 277*   Basic Metabolic Panel  Recent Labs  03/26/16 0531 03/26/16 0533 03/26/16 1117 03/27/16 0230  NA 136 138  --  136  K 4.1 4.0  --  3.6  CL 102 101  --  103  CO2 23  --   --  23  GLUCOSE 242* 224*  --  113*  BUN 29* 30*  --  29*  CREATININE 1.14 1.00 1.03 1.46*  CALCIUM 9.3  --   --  9.2   Liver Function Tests  Recent Labs  03/26/16 0531  AST 18  ALT 13*  ALKPHOS 61  BILITOT 0.6  PROT 6.6  ALBUMIN 3.9   No results for input(s): LIPASE, AMYLASE in the last 72 hours. Cardiac Enzymes  Recent Labs  03/26/16 0531  03/26/16 1502 03/26/16 2130 03/27/16 1139  CKTOTAL 89  --   --   --   --   CKMB 4.9  --   --   --   --   TROPONINI  --   < > 4.83* 17.86* 18.98*  < > = values in this interval not displayed. BNP Invalid input(s): POCBNP D-Dimer No results for input(s): DDIMER in the last 72 hours. Hemoglobin A1C  Recent Labs  03/26/16 1117  HGBA1C 7.1*   Fasting Lipid Panel  Recent Labs  03/26/16 0531  CHOL 152  HDL 47  LDLCALC 89  TRIG 80  CHOLHDL 3.2   Thyroid Function Tests No results for input(s): TSH, T4TOTAL, T3FREE, THYROIDAB in the last 72 hours.  Invalid input(s): FREET3  Telemetry    NSR  ECG    11/10: NSR, improvement in ST elevations inferiorly and V5-V6. Persistent ST elevation in lead III. T-wave inversions present.    Cardiac Studies and Radiology    Dg Chest Portable 1 View  Result Date: 03/26/2016 CLINICAL DATA:  Central chest pain EXAM: PORTABLE CHEST 1 VIEW COMPARISON:  Chest radiograph 01/22/2015 FINDINGS: Persistent cardiomegaly. Shallow lung  inflation without focal airspace consolidation or pulmonary edema. No pneumothorax or pleural effusion. IMPRESSION: Cardiomegaly. Shallow lung inflation without focal airspace disease. Electronically Signed   By: Ulyses Jarred M.D.   On: 03/26/2016 05:20    Echocardiogram:  Study Conclusions  - Left ventricle: Suggestive of hypokinesis of the distal inferior   wall but the apical 2 chamber view is somewhat forshortened. The   cavity size was normal. There was moderate focal basal and mild   concentric hypertrophy. Systolic function was normal. The   estimated ejection fraction was in the range of 60% to 65%. Wall   motion was normal; there were no regional wall motion   abnormalities. There was an increased relative contribution of   atrial contraction to ventricular filling. Doppler parameters are   consistent with abnormal left ventricular relaxation (grade 1   diastolic dysfunction). - Aortic valve: Trileaflet; normal thickness, mildly calcified   leaflets. There was trivial regurgitation. - Aorta: Aortic root dimension: 46 mm (ED). Ascending aortic   diameter: 45 mm (S). - Aortic root: The aortic root was moderately dilated. - Ascending aorta: The ascending aorta was moderately dilated. - Mitral valve: There was trivial regurgitation. - Atrial septum: There was increased thickness of the septum,   consistent with lipomatous hypertrophy.  Assessment & Plan    CAD with Infero-lateral STEMI: LHC showed patent LIMA-LAD, acute occlusion SVG-PDA.  He had DES to SVG-PDA.  After procedure, noted occlusion of distal PDA.  This was not intervened upon as CP had resolved.  Echo showed EF preserved with inferior hypokinesis.  - Continue ASA 81, Plavix 75.  - Added Zetia 10 daily.  - He is on labetalol and losartan.   AKI: Needs repeat BMET today.  As long as creatinine stable to decreased, can go home.   CVA: Prior. Followed by neurology.   Disposition: CBC and BMET sent stat.  If these  look ok, may go home.  Needs followup with Dr Ellyn Hack and will need cardiac rehab.  The only changes in meds will be increase in Plavix to 75 daily and addition of Zetia 10 daily.  [name] 03/28/2016 11:09 AM

## 2016-03-28 NOTE — Discharge Instructions (Signed)
Heart Healthy Diabetic Diet  Do not stop Plavix and Asprin, stopping could cause a heart attack.  Do not restart your metformin until Sunday the 12th, may interact with cath dye.  Our office will call with follow up appt on Monday.  Call the office if you have not heard from them by 11 AM.

## 2016-03-28 NOTE — Discharge Summary (Signed)
Physician Discharge Summary       Patient ID: MAZI BRAILSFORD MRN: [id number] DOB/AGE: Mar 04, 1931 82 y.o.  Admit date: 03/26/2016 Discharge date: 03/28/2016 Primary Cardiologist:   Discharge Diagnoses:  Principal Problem:   STEMI (ST elevation myocardial infarction) [facility]) Active Problems:   S/P angioplasty with stent 03/26/16 to VG to PDA for in-stent restenosis with DES.   Diabetes mellitus type 2 in obese [facility])   Coronary artery disease involving coronary bypass graft of native heart with other forms of angina pectoris (HCC)   Essential hypertension   History of stroke   AKI (acute kidney injury) (HCC)   Acute ST elevation myocardial infarction (STEMI) of inferior wall [facility])   Discharged Condition: good  Procedures: cardiac cath and PCI by Dr. Okey Dupre Bypass Graft Angiography  Coronary Stent Intervention  Left Heart Cath and Coronary Angiography  Conclusion   Conclusions: 1. Significant native coronary artery disease, including 70% ostial LAD stenosis, 50% mid LCx lesion, and 99% proximal RCA disease with TIMI-1 flow (chronic per prior cath reports). 2. Patent LIMA to LAD. 3. Acutely occluded SVG to PDA within previously placed stent. 4. Successful PCI to proximal SVG to PDA in-stent restenosis/thrombosis with placement of a Promus Premier 3.0 x 16 mm drug eluting stent (dilated to 3.3 mm) with 0% residual stenosis and TIMI-3 flow. 5. Post-PCI, occlusion of the distal PDA was noted at the site of previous 70% stenosis.  IC verapamil was administered during the intervention to prevent no-reflow.  Tirofiban bolus was also administered.  Given that the patient was chest-pain free, further intervention was not performed.  Recommendations: 1. Transfer to cardiac ICU for post-STEMI care. 2. Transthoracic echocardiogram. 3. Dual antiplatelet therapy with ASA 81 mg daily and clopidogrel 75 mg daily for at least 12 months, ideally longer.  Yvonne Kendall, MD   ECHO 03/26/16 Study  Conclusions  - Left ventricle: Suggestive of hypokinesis of the distal inferior   wall but the apical 2 chamber view is somewhat forshortened. The   cavity size was normal. There was moderate focal basal and mild   concentric hypertrophy. Systolic function was normal. The   estimated ejection fraction was in the range of 60% to 65%. Wall   motion was normal; there were no regional wall motion   abnormalities. There was an increased relative contribution of   atrial contraction to ventricular filling. Doppler parameters are   consistent with abnormal left ventricular relaxation (grade 1   diastolic dysfunction). - Aortic valve: Trileaflet; normal thickness, mildly calcified   leaflets. There was trivial regurgitation. - Aorta: Aortic root dimension: 46 mm (ED). Ascending aortic   diameter: 45 mm (S). - Aortic root: The aortic root was moderately dilated. - Ascending aorta: The ascending aorta was moderately dilated. - Mitral valve: There was trivial regurgitation. - Atrial septum: There was increased thickness of the septum,   consistent with lipomatous hypertrophy.   Hospital Course:  82 yo with history of CABG x 2 (LIMA-LAD and SVG-PDA) in 7/12 as well as prior CVA presented with infero-lateral STEMI morning of 03/26/16.  His last procedure was DES to SVG-PDA in 2/16.  Overnight, he developed 6-7/10 central chest tightness.  This did not resolve so he called EMS.  ST elevation noted in inferior leads and V5-V6.  In the ER, he had 3/10 chest pain after NTG.  Day prior to admit, he felt fatigued but no chest pain. No overt GI bleeding, rare hematuria. He went emergently to cath lab. Results as above.  Previous stent I VG to PDA was acutely occluded. Pt underwent successful PCI to proximal SVG to PDA in-stent restenosis/thrombosis with placement of a Promus Premier 3.0 x 16 mm drug eluting stent (dilated to 3.3 mm) with 0% residual stenosis and TIMI-3 flow.  Post-PCI, occlusion of the distal  PDA was noted at the site of previous 70% stenosis.  IC verapamil was administered during the intervention to prevent no-reflow.  Tirofiban bolus was also administered.  Given that the patient was chest-pain free, further intervention was not performed.  Pt has done well since procedure. No further chest pain.  Has had statin intolerance to multiple statins. May benefit from starting Zetia.  He was seen today by Dr. Aundra Dubin and found stable for discharge.  Plan for cardiac rehab in [place]. He will continue DAPT for at least a year he is on BB and ARB.  He developed acute kidney injury post cath and at discharge the Cr. Is stable at 1.46.  He has ambulated without problems.  Will follow up in 5 days.  Will need BMP this week in office.   Consults: None  Significant Diagnostic Studies:  BMP Latest Ref Rng & Units 03/28/2016 03/27/2016 03/26/2016  Glucose 65 - 99 mg/dL 255(H) 113(H) -  BUN 6 - 20 mg/dL 30(H) 29(H) -  Creatinine 0.61 - 1.24 mg/dL 1.46(H) 1.46(H) 1.03  Sodium 135 - 145 mmol/L 135 136 -  Potassium 3.5 - 5.1 mmol/L 4.1 3.6 -  Chloride 101 - 111 mmol/L 102 103 -  CO2 22 - 32 mmol/L 23 23 -  Calcium 8.9 - 10.3 mg/dL 9.0 9.2 -   CBC Latest Ref Rng & Units 03/28/2016 03/27/2016 03/26/2016  WBC 4.0 - 10.5 K/uL 8.2 11.0(H) 8.1  Hemoglobin 13.0 - 17.0 g/dL 12.5(L) 12.4(L) 12.1(L)  Hematocrit 39.0 - 52.0 % 38.1(L) 37.0(L) 36.6(L)  Platelets 150 - 400 K/uL 430(H) 421(H) 413(H)    Hepatic Function Latest Ref Rng & Units 03/26/2016 01/22/2015 06/27/2014  Total Protein 6.5 - 8.1 g/dL 6.6 6.1(L) 6.3  Albumin 3.5 - 5.0 g/dL 3.9 3.9 4.3  AST 15 - 41 U/L '18 19 18  '$ ALT 17 - 63 U/L 13(L) 15(L) 17  Alk Phosphatase 38 - 126 U/L 61 52 63  Total Bilirubin 0.3 - 1.2 mg/dL 0.6 0.7 0.6   Troponin pk 18.98   hgb A1C 7.1   Discharge Exam: Blood pressure 121/68, pulse 65, temperature 98 F (36.7 C), temperature source Oral, resp. rate 18, height '5\' 11"'$  (1.803 m), weight 220 lb (99.8 kg), SpO2 97  %.  Disposition: 01-Home or Self Care  Discharge Instructions    Amb Referral to Cardiac Rehabilitation    Complete by:  As directed    Referring to [place] Phase 2   Diagnosis:   STEMI Coronary Stents         Medication List    STOP taking these medications   aspirin 325 MG tablet Replaced by:  aspirin 81 MG chewable tablet   cephALEXin 500 MG capsule Commonly known as:  KEFLEX     TAKE these medications   amLODipine 10 MG tablet Commonly known as:  NORVASC Take 10 mg by mouth every evening.   aspirin 81 MG chewable tablet Chew 1 tablet (81 mg total) by mouth daily. Start taking on:  03/29/2016 Replaces:  aspirin 325 MG tablet   clopidogrel 75 MG tablet Commonly known as:  PLAVIX Take 0.5 tablets (37.5 mg total) by mouth every other day.   ezetimibe 10  MG tablet Commonly known as:  ZETIA Take 1 tablet (10 mg total) by mouth daily. Start taking on:  03/29/2016   glimepiride 4 MG tablet Commonly known as:  AMARYL Take 4 mg by mouth 2 (two) times daily with a meal.   hydrALAZINE 50 MG tablet Commonly known as:  APRESOLINE Take 1 tablet (50 mg total) by mouth 2 (two) times daily.   labetalol 200 MG tablet Commonly known as:  NORMODYNE Take 100 mg by mouth 2 (two) times daily.   losartan-hydrochlorothiazide 100-25 MG tablet Commonly known as:  HYZAAR Take 1 tablet by mouth every morning.   metFORMIN 1000 MG tablet Commonly known as:  GLUCOPHAGE Take 1 tablet (1,000 mg total) by mouth 2 (two) times daily. Start taking on:  03/29/2016   nitroGLYCERIN 0.4 MG SL tablet Commonly known as:  NITROSTAT Place 1 tablet (0.4 mg total) under the tongue every 5 (five) minutes as needed for chest pain (up to 3 doses).      Follow-up Information    Glenetta Hew, MD Follow up.   Specialty:  Cardiology Why:  the office will call date and time Contact information: [address] [place] [address] [phone]            Discharge  Instructions: Heart Healthy Diabetic Diet  Do not stop Plavix and Asprin, stopping could cause a heart attack.  Do not restart your metformin until Sunday the 12th, may interact with cath dye.  Our office will call with follow up appt on Monday.  Call the office if you have not heard from them by 11 AM.    Signed: Cecilie Kicks Nurse Practitioner-Certified [facility]: [facility] 03/28/2016, 12:21 PM  Time spent on discharge : > 30 minutes.

## 2016-03-29 ENCOUNTER — Emergency Department (HOSPITAL_COMMUNITY)
Admission: EM | Admit: 2016-03-29 | Discharge: 2016-03-29 | Disposition: A | Discharge status: Home / Self Care | Payer: Medicare Other | Attending: Emergency Medicine | Admitting: Emergency Medicine

## 2016-03-29 ENCOUNTER — Encounter (HOSPITAL_COMMUNITY): Payer: Self-pay

## 2016-03-29 DIAGNOSIS — E119 Type 2 diabetes mellitus without complications: Secondary | ICD-10-CM | POA: Diagnosis not present

## 2016-03-29 DIAGNOSIS — I252 Old myocardial infarction: Secondary | ICD-10-CM | POA: Diagnosis not present

## 2016-03-29 DIAGNOSIS — T83098A Other mechanical complication of other indwelling urethral catheter, initial encounter: Secondary | ICD-10-CM | POA: Insufficient documentation

## 2016-03-29 DIAGNOSIS — I10 Essential (primary) hypertension: Secondary | ICD-10-CM | POA: Insufficient documentation

## 2016-03-29 DIAGNOSIS — Z7984 Long term (current) use of oral hypoglycemic drugs: Secondary | ICD-10-CM | POA: Insufficient documentation

## 2016-03-29 DIAGNOSIS — Z87891 Personal history of nicotine dependence: Secondary | ICD-10-CM | POA: Diagnosis not present

## 2016-03-29 DIAGNOSIS — Z951 Presence of aortocoronary bypass graft: Secondary | ICD-10-CM | POA: Insufficient documentation

## 2016-03-29 DIAGNOSIS — I2510 Atherosclerotic heart disease of native coronary artery without angina pectoris: Secondary | ICD-10-CM | POA: Insufficient documentation

## 2016-03-29 DIAGNOSIS — Y731 Therapeutic (nonsurgical) and rehabilitative gastroenterology and urology devices associated with adverse incidents: Secondary | ICD-10-CM | POA: Insufficient documentation

## 2016-03-29 DIAGNOSIS — N3001 Acute cystitis with hematuria: Secondary | ICD-10-CM | POA: Insufficient documentation

## 2016-03-29 DIAGNOSIS — T83010A Breakdown (mechanical) of cystostomy catheter, initial encounter: Secondary | ICD-10-CM

## 2016-03-29 DIAGNOSIS — Z7982 Long term (current) use of aspirin: Secondary | ICD-10-CM | POA: Diagnosis not present

## 2016-03-29 LAB — URINALYSIS, ROUTINE W REFLEX MICROSCOPIC
BILIRUBIN URINE: NEGATIVE
GLUCOSE, UA: 100 mg/dL — AB
KETONES UR: NEGATIVE mg/dL
NITRITE: POSITIVE — AB
PH: 6 (ref 5.0–8.0)
Protein, ur: 100 mg/dL — AB
SPECIFIC GRAVITY, URINE: 1.016 (ref 1.005–1.030)

## 2016-03-29 LAB — URINE MICROSCOPIC-ADD ON

## 2016-03-29 MED ORDER — FOSFOMYCIN TROMETHAMINE 3 G PO PACK
3.0000 g | PACK | Freq: Once | ORAL | Status: AC
  Administered 2016-03-29: 3 g via ORAL
  Filled 2016-03-29: qty 3

## 2016-03-29 NOTE — ED Triage Notes (Signed)
Patient states that he has been unable to void for 2 hours. Has foley with leg bag and has been in place x 60 days. States that the foley when not drain his bladder. Uncomfortable on arrival

## 2016-03-29 NOTE — ED Notes (Addendum)
Tried to find a urinary leg bag to fit pts suprapubic catheter.  Brand is a Uresil tru close 600 ml bag.  None could be found at [facility], renal floor, central supply.  Called [place], per [employer], that kind of leg bag is not stocked on floor and that it sounded like some IR would have connected to suprapubic catheter.  Museum/gallery conservator, charge nurse took pts name, phone number, date of birth and will have someone contact [facility] and patient so that a new bag can be delivered to patient.  Advised pt of same.

## 2016-03-29 NOTE — Discharge Instructions (Signed)
Call your urologist in the morning and discuss the possibility of UTI.

## 2016-03-29 NOTE — ED Notes (Addendum)
The leg bag has not been changed since inserted around two months ago.  Pt reports he has had difficulties getting [facility] to return his call.  This nurse disconnected leg bag tubing from suprapubic catheter and catheter drained immediately.  Several blood clots noted in leg bag and tubing.

## 2016-03-29 NOTE — ED Provider Notes (Signed)
[facility] DEPT Provider Note   CSN: [id number] Arrival date & time: 03/29/16  1512     History   Chief Complaint Chief Complaint  Patient presents with  . foley issue    HPI [name] is a 82 y.o. male.  82 yo M with a chief complaint of a blocked Foley catheter. This was started at least 2 hours ago. Patient now having trouble voiding anything and having severe suprapubic tenderness. He was discharged from the hospital yesterday after having an MI. He denies any chest pain or shortness of breath. Denies any nausea or vomiting. Denies flank pain. Has a Foley for the past 2 months has not had it changed. Denies any blood in the catheter. Denies fevers or chills.   The history is provided by the patient and the spouse.  Abdominal Pain   This is a new problem. The current episode started 1 to 2 hours ago. The problem occurs constantly. The problem has been rapidly worsening. The pain is associated with an unknown factor. The pain is located in the suprapubic region. The quality of the pain is sharp and shooting. The pain is at a severity of 10/10. The pain is severe. Pertinent negatives include fever, diarrhea, vomiting, headaches, arthralgias and myalgias. Nothing aggravates the symptoms. Nothing relieves the symptoms.    Past Medical History:  Diagnosis Date  . Abdominal aortic aneurysm ([place])    a. Aortic duplex 06/2014: mild aneurysmal dilatation of proximal abdominal aorta measuring 3.4x3.4cm. No sig change from 2012. F/u due 06/2016.  Marland Kitchen AKI (acute kidney injury) ([place]) 03/28/2016  . Arthritis    "right knee; never bothered me" (03/26/2016)  . 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 CAD in native artery    a. NSTEMI 11/2010 - CABG x2(LIMA to LAD, SVG to PDA-11/2010). b. NEGATIVE Lexiscan Myoview 6/9/'15, EF 53%, no perfusion abnormality, septal and apical HK noted. C. NSTEMI 05/2014 - s/p DES to SVG-RPDA 06/18/14 (Xience Alpine DES 3.0 x 18  mm -3.35 mm), EF 60-65%.  . Dizziness    a. Carotid duplex 03/2014: mild fibrous plaque, no significant stenosis.  . Foley catheter in place    "been wearing it for a couple months now" (03/26/2016)  . HTN (hypertension)   . Hyperlipidemia LDL goal <70   . Non-STEMI (non-ST elevated myocardial infarction) [facility]) July 2012   1. Ostial LAD 70% (to close to [facility] for PCI), subtotal occlusion of the RCA  . Postoperative atrial fibrillation ([place]) 11/2010   Post CABG, no sign recurrence  . Refusal of blood transfusions as patient is Jehovah's Witness   . 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 Servando Snare)  . Sleep apnea    Not on CPAP. (03/26/2016)  . STEMI (ST elevation myocardial infarction) ([place]) 03/26/2016  . Stroke [facility]) 2014; 01/2015   a. 2014 with mild right hand weakness, nonhemorrhagic per pt.;; b. - PTA-Stent L ICA 95%   . Type II diabetes mellitus [facility])     Patient Active Problem List   Diagnosis Date Noted  . AKI (acute kidney injury) ([place]) 03/28/2016  . S/P angioplasty with stent 03/26/16 to VG to PDA for in-stent restenosis with DES. 03/28/2016  . Acute ST elevation myocardial infarction (STEMI) of inferior wall ([place]) 03/28/2016  . Carpal tunnel syndrome of right wrist 06/24/2015  . History of stroke 03/21/2015  . Type 2 diabetes mellitus with circulatory disorder ([place]) 03/21/2015  .  Stenosis of carotid artery   . Essential hypertension   . CVA (cerebral infarction) 01/22/2015  . CAD S/P percutaneous coronary angioplasty 09/19/2014  . Coronary artery disease involving coronary bypass graft of native heart with other forms of angina pectoris ([place]) 09/19/2014  . Shortness of breath 09/19/2014  . Polypharmacy 09/19/2014  . Abdominal aortic aneurysm ([place])   . Stroke ([employer])   . Hyperlipidemia LDL goal <70   . Type II diabetes mellitus with complication ([place])   . Coronary artery disease involving native coronary artery   . Abnormal TSH 06/19/2014  . Obesity 06/19/2014  .  STEMI (ST elevation myocardial infarction) ([place]) 06/18/2014  . Bilateral lower extremity edema 03/30/2014  . Dizziness 02/21/2014  . Bradycardia 08/31/2013  . Postoperative atrial fibrillation ([facility])     Class: Temporary  . Diabetes mellitus type 2 in obese ([place])   . Moderate essential hypertension   . Apnea, sleep   . Balanitis xerotica obliterans   . History of: Non-STEMI (non-ST elevated myocardial infarction) 11/16/2010    Class: History of  . S/P CABG x 2 11/16/2010    Past Surgical History:  Procedure Laterality Date  . APPENDECTOMY    . CARDIAC CATHETERIZATION  12/11/2010   Dr. Chase Picket - subsequent cath - normal LV systolic function, no renal artery stenosis, severe 2-vessel disease with subtotaled RCA prox and distal 60% lesion and complex 70% area of narrowing of ostium of LAD  . CARDIAC CATHETERIZATION N/A 03/26/2016   Procedure: Left Heart Cath and Coronary Angiography;  Surgeon: Nelva Bush, MD;  Location: [place] CV LAB;  Service: Cardiovascular;  Laterality: N/A;  . CARDIAC CATHETERIZATION N/A 03/26/2016   Procedure: Coronary Stent Intervention;  Surgeon: Nelva Bush, MD;  Location: [place] CV LAB;  Service: Cardiovascular;  Laterality: N/A;  . CARDIAC CATHETERIZATION N/A 03/26/2016   Procedure: Bypass Graft Angiography;  Surgeon: Nelva Bush, MD;  Location: [place] CV LAB;  Service: Cardiovascular;  Laterality: N/A;  . CORONARY ANGIOPLASTY WITH STENT PLACEMENT  06/18/2014   PCI to SVG-RPDA 06/18/14 (Xience Alpine DES 3.0 x 18 mm -3.35 mm),   . CORONARY ARTERY BYPASS GRAFT  12/15/2010   X2, Dr Servando Snare; LIMA to LAD, SVG to PDA;   . CYSTOSCOPY WITH URETHRAL DILATATION    . IR GENERIC HISTORICAL  01/21/2016   IR RADIOLOGIST EVAL & MGMT 01/21/2016 [facility]INTER[facility] RAD  . IR GENERIC HISTORICAL  02/03/2016   IR CATHETER TUBE CHANGE 02/03/2016 Marybelle Killings, MD [facility]-INTERV RAD  . LEFT HEART CATHETERIZATION WITH CORONARY ANGIOGRAM N/A 06/18/2014   Procedure: LEFT HEART  CATHETERIZATION WITH CORONARY ANGIOGRAM;  Surgeon: Leonie Man, MD;  Location: [facility] CATH LAB;  Service: Cardiovascular;  -- severe disease of SVG-rPDA  . RADIOLOGY WITH ANESTHESIA N/A 01/24/2015   Procedure: STENT ASSISTED ANGIOPLASTY (RADIOLOGY WITH ANESTHESIA);  Surgeon: Luanne Bras, MD;  Location: [place];  Service: Radiology;  Laterality: N/A;  . TONSILLECTOMY    . TRANSTHORACIC ECHOCARDIOGRAM  07/28/2012; 03/25/2015:   a. EF 55-60%, severe Conc LVH, Nl Systolic fxn, G1 DD,  Ao Sclerosis w/o stenosis; CRO PFO;; b. Moderate concentric LVH. EF 60-65%. No RWMA, Gr 1 DD. Mild LA dilation       Home Medications    Prior to Admission medications   Medication Sig Start Date End Date Taking? Authorizing Provider  amLODipine (NORVASC) 10 MG tablet Take 10 mg by mouth every evening.     Historical Provider, MD  aspirin 81 MG chewable tablet Chew 1 tablet (81  mg total) by mouth daily. 03/29/16   Isaiah Serge, NP  clopidogrel (PLAVIX) 75 MG tablet Take 0.5 tablets (37.5 mg total) by mouth every other day. 12/23/15   Leonie Man, MD  ezetimibe (ZETIA) 10 MG tablet Take 1 tablet (10 mg total) by mouth daily. 03/29/16   Isaiah Serge, NP  glimepiride (AMARYL) 4 MG tablet Take 4 mg by mouth 2 (two) times daily with a meal.     Historical Provider, MD  hydrALAZINE (APRESOLINE) 50 MG tablet Take 1 tablet (50 mg total) by mouth 2 (two) times daily. 10/29/15   Leonie Man, MD  labetalol (NORMODYNE) 200 MG tablet Take 100 mg by mouth 2 (two) times daily.     Historical Provider, MD  losartan-hydrochlorothiazide (HYZAAR) 100-25 MG per tablet Take 1 tablet by mouth every morning.     Historical Provider, MD  metFORMIN (GLUCOPHAGE) 1000 MG tablet Take 1 tablet (1,000 mg total) by mouth 2 (two) times daily. 03/29/16   Isaiah Serge, NP  nitroGLYCERIN (NITROSTAT) 0.4 MG SL tablet Place 1 tablet (0.4 mg total) under the tongue every 5 (five) minutes as needed for chest pain (up to 3 doses). 06/19/14    Charlie Pitter, PA-C    Family History Family History  Problem Relation Age of Onset  . Heart Problems Father 62    Social History Social History  Substance Use Topics  . Smoking status: Former Smoker    Packs/day: 0.33    Years: 10.00    Types: Cigarettes    Quit date: 1961  . Smokeless tobacco: Never Used  . Alcohol use No     Allergies   Atorvastatin; Crestor [rosuvastatin]; Other; Pravastatin; and Simvastatin   Review of Systems Review of Systems  Constitutional: Negative for chills and fever.  HENT: Negative for congestion and facial swelling.   Eyes: Negative for discharge and visual disturbance.  Respiratory: Negative for shortness of breath.   Cardiovascular: Negative for chest pain and palpitations.  Gastrointestinal: Positive for abdominal pain. Negative for diarrhea and vomiting.  Genitourinary: Positive for decreased urine volume and difficulty urinating.  Musculoskeletal: Negative for arthralgias and myalgias.  Skin: Negative for color change and rash.  Neurological: Negative for tremors, syncope and headaches.  Psychiatric/Behavioral: Negative for confusion and dysphoric mood.     Physical Exam Updated Vital Signs BP (!) 210/111   Pulse 86   Resp 18   SpO2 100%   Physical Exam  Constitutional: He is oriented to person, place, and time. He appears well-developed and well-nourished.  HENT:  Head: Normocephalic and atraumatic.  Eyes: EOM are normal. Pupils are equal, round, and reactive to light.  Neck: Normal range of motion. Neck supple. No JVD present.  Cardiovascular: Normal rate and regular rhythm.  Exam reveals no gallop and no friction rub.   No murmur heard. Pulmonary/Chest: No respiratory distress. He has no wheezes.  Abdominal: He exhibits no distension. There is tenderness (palpable bladder). There is no rebound and no guarding.  Musculoskeletal: Normal range of motion.  Neurological: He is alert and oriented to person, place, and time.    Skin: No rash noted. No pallor.  Psychiatric: He has a normal mood and affect. His behavior is normal.  Nursing note and vitals reviewed.    ED Treatments / Results  Labs (all labs ordered are listed, but only abnormal results are displayed) Labs Reviewed  URINALYSIS, ROUTINE W REFLEX MICROSCOPIC (NOT AT [facility]) - Abnormal; Notable for the following:  Result Value   APPearance TURBID (*)    Glucose, UA 100 (*)    Hgb urine dipstick SMALL (*)    Protein, ur 100 (*)    Nitrite POSITIVE (*)    Leukocytes, UA LARGE (*)    All other components within normal limits  URINE MICROSCOPIC-ADD ON - Abnormal; Notable for the following:    Squamous Epithelial / LPF 0-5 (*)    Bacteria, UA MANY (*)    All other components within normal limits  URINE CULTURE    EKG  EKG Interpretation None       Radiology No results found.  Procedures Procedures (including critical care time)  Medications Ordered in ED Medications  fosfomycin (MONUROL) packet 3 g (not administered)     Initial Impression / Assessment and Plan / ED Course  I have reviewed the triage vital signs and the nursing notes.  Pertinent labs & imaging results that were available during my care of the patient were reviewed by me and considered in my medical decision making (see chart for details).  Clinical Course     82 yo M With a chief complaint of Foley catheter block. He has been unable to urinate for the past couple hours. Now having some severe suprapubic tenderness. Will troubleshoot Foley.  Patient's tubing was clogged. This was able to be cleaned out. We're unable to find a replacement part at this time. I suggested that he call his urologist in the morning. His UA is concerning for possible infection. I would like to start him on a fluoroquinolone however he has multiple medications that may have significant side effects with this. Patient also just had a ST elevation MI. Will give him a dose of  fosfomycin. Discuss with urology if they want further antibiotic therapy.  5:19 PM:  I have discussed the diagnosis/risks/treatment options with the patient and family and believe the pt to be eligible for discharge home to follow-up with Urology. We also discussed returning to the ED immediately if new or worsening sx occur. We discussed the sx which are most concerning (e.g., sudden worsening pain, fever, inability to tolerate by mouth) that necessitate immediate return. Medications administered to the patient during their visit and any new prescriptions provided to the patient are listed below.  Medications given during this visit Medications  fosfomycin (MONUROL) packet 3 g (not administered)     The patient appears reasonably screen and/or stabilized for discharge and I doubt any other medical condition or other [facility] requiring further screening, evaluation, or treatment in the ED at this time prior to discharge.    Final Clinical Impressions(s) / ED Diagnoses   Final diagnoses:  Suprapubic catheter dysfunction, initial encounter [facility])  Acute cystitis with hematuria    New Prescriptions New Prescriptions   No medications on file     Deno Etienne, DO 03/29/16 1719

## 2016-03-29 NOTE — ED Notes (Signed)
Urine is draining into the leg bag at this time.  Pt c/o no discomfort.

## 2016-03-30 ENCOUNTER — Telehealth: Payer: Self-pay | Admitting: Cardiology

## 2016-03-30 DIAGNOSIS — R300 Dysuria: Secondary | ICD-10-CM | POA: Diagnosis not present

## 2016-03-30 DIAGNOSIS — R338 Other retention of urine: Secondary | ICD-10-CM | POA: Diagnosis not present

## 2016-03-30 DIAGNOSIS — N35010 Post-traumatic urethral stricture, male, meatal: Secondary | ICD-10-CM | POA: Diagnosis not present

## 2016-03-30 NOTE — Telephone Encounter (Signed)
error 

## 2016-03-31 LAB — URINE CULTURE: Culture: >=100000 — AB

## 2016-04-01 ENCOUNTER — Telehealth (HOSPITAL_BASED_OUTPATIENT_CLINIC_OR_DEPARTMENT_OTHER): Payer: Self-pay | Admitting: Emergency Medicine

## 2016-04-01 NOTE — Telephone Encounter (Signed)
Post ED Visit - Positive Culture Follow-up  Culture report reviewed by antimicrobial stewardship pharmacist:  []  Elenor Quinones, Pharm.D. []  Heide Guile, Pharm.D., BCPS []  Parks Neptune, Pharm.D. []  Alycia Rossetti, Pharm.D., BCPS []  Nason, Pharm.D., BCPS, AAHIVP []  Legrand Como, Pharm.D., BCPS, AAHIVP []  Milus Glazier, Pharm.D. []  Stephens November, Pharm.Blair Promise PharmD  Positive urine culture Treated with fosfomycin, organism sensitive to the same and no further patient follow-up is required at this time.  Hazle Nordmann 04/01/2016, 10:34 AM

## 2016-04-02 ENCOUNTER — Ambulatory Visit (INDEPENDENT_AMBULATORY_CARE_PROVIDER_SITE_OTHER): Payer: Medicare Other | Admitting: Nurse Practitioner

## 2016-04-02 ENCOUNTER — Encounter: Payer: Self-pay | Admitting: Nurse Practitioner

## 2016-04-02 VITALS — BP 135/69 | HR 64 | Ht 71.0 in | Wt 214.2 lb

## 2016-04-02 DIAGNOSIS — I25708 Atherosclerosis of coronary artery bypass graft(s), unspecified, with other forms of angina pectoris: Secondary | ICD-10-CM | POA: Diagnosis not present

## 2016-04-02 DIAGNOSIS — I10 Essential (primary) hypertension: Secondary | ICD-10-CM | POA: Diagnosis not present

## 2016-04-02 DIAGNOSIS — E782 Mixed hyperlipidemia: Secondary | ICD-10-CM

## 2016-04-02 DIAGNOSIS — I219 Acute myocardial infarction, unspecified: Secondary | ICD-10-CM

## 2016-04-02 DIAGNOSIS — Z9861 Coronary angioplasty status: Secondary | ICD-10-CM

## 2016-04-02 DIAGNOSIS — I2510 Atherosclerotic heart disease of native coronary artery without angina pectoris: Secondary | ICD-10-CM

## 2016-04-02 DIAGNOSIS — I213 ST elevation (STEMI) myocardial infarction of unspecified site: Secondary | ICD-10-CM | POA: Diagnosis not present

## 2016-04-02 DIAGNOSIS — N179 Acute kidney failure, unspecified: Secondary | ICD-10-CM | POA: Diagnosis not present

## 2016-04-02 LAB — BASIC METABOLIC PANEL
BUN: 39 mg/dL — ABNORMAL HIGH (ref 7–25)
CALCIUM: 9.8 mg/dL (ref 8.6–10.3)
CO2: 25 mmol/L (ref 20–31)
Chloride: 104 mmol/L (ref 98–110)
Creat: 1.35 mg/dL — ABNORMAL HIGH (ref 0.70–1.11)
GLUCOSE: 112 mg/dL — AB (ref 65–99)
POTASSIUM: 5.1 mmol/L (ref 3.5–5.3)
SODIUM: 140 mmol/L (ref 135–146)

## 2016-04-02 MED FILL — Bivalirudin For IV Soln 250 MG: INTRAVENOUS | Qty: 250 | Status: AC

## 2016-04-02 NOTE — Progress Notes (Signed)
Office Visit    Patient Name: [name] Date of Encounter: 04/02/2016  Primary Care Provider:  Leonides Sake, MD Primary Cardiologist:  Roni Bread, MD   Chief Complaint    82 year old male with prior history of CAD status post CABG 2 in 2012, hypertension, hyperlipidemia, obesity, diabetes, stroke, and recent STEMI requiring PCI of the vein graft to the PDA.  Past Medical History    Past Medical History:  Diagnosis Date  . Abdominal aortic aneurysm ([place])    a. Aortic duplex 06/2014: mild aneurysmal dilatation of proximal abdominal aorta measuring 3.4x3.4cm. No sig change from 2012. F/u due 06/2016;   . AKI (acute kidney injury) ([place]) 03/28/2016  . Arthritis    "right knee; never bothered me" (03/26/2016)  . 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 CAD in native artery    a. NSTEMI 11/2010 - CABG x2(LIMA to LAD, SVG to PDA). b. NEG Lexi MV 10/24/13, EF 53%, no perfusion abnormality, septal and apical HK noted. c. NSTEMI 05/2014 - s/p DES to SVG-RPDA 06/18/14 (Xience Alpine DES 3.0 x 18 mm -3.35 mm), EF 60-65; d. 03/2016 STEMI/PCI: LM nl, LAD 70ost, LCX 53m, RCA 95p, 24m, RPDA 70, LIMA->LAD ok, VG->RPDA 100p (3.0x16 Promus DES overlapping prior stent).  . Diastolic dysfunction    a. 03/2016 Echo: EF 60-65%, no rwma, Gr1 DD, triv AI, Ao root 68mm, Asc Ao 71mm, triv MR.  . Dizziness    a. Carotid duplex 03/2014: mild fibrous plaque, no significant stenosis.  . Foley catheter in place    "been wearing it for a couple months now" (03/26/2016)  . HTN (hypertension)   . Hyperlipidemia LDL goal <70   . Non-STEMI (non-ST elevated myocardial infarction) ([place]) 11/2010   1. Ostial LAD 70% (to close to [facility] for PCI), subtotal occlusion of the RCA  . Postoperative atrial fibrillation ([place]) 11/2010   Post CABG, no sign recurrence  . Refusal of blood transfusions as patient is Jehovah's Witness   . 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  Servando Snare)  . Sleep apnea    Not on CPAP. (03/26/2016)  . Stroke [facility]) 2014; 01/2015   a. 2014 with mild right hand weakness, nonhemorrhagic per pt.;; b. - PTA-Stent L ICA 95%   . Type II diabetes mellitus ([place])    Past Surgical History:  Procedure Laterality Date  . APPENDECTOMY    . CARDIAC CATHETERIZATION  12/11/2010   Dr. Chase Picket - subsequent cath - normal LV systolic function, no renal artery stenosis, severe 2-vessel disease with subtotaled RCA prox and distal 60% lesion and complex 70% area of narrowing of ostium of LAD  . CARDIAC CATHETERIZATION N/A 03/26/2016   Procedure: Left Heart Cath and Coronary Angiography;  Surgeon: Nelva Bush, MD;  Location: [place] CV LAB;  Service: Cardiovascular;  Laterality: N/A;  . CARDIAC CATHETERIZATION N/A 03/26/2016   Procedure: Coronary Stent Intervention;  Surgeon: Nelva Bush, MD;  Location: [place] CV LAB;  Service: Cardiovascular;  Laterality: N/A;  . CARDIAC CATHETERIZATION N/A 03/26/2016   Procedure: Bypass Graft Angiography;  Surgeon: Nelva Bush, MD;  Location: [place] CV LAB;  Service: Cardiovascular;  Laterality: N/A;  . CORONARY ANGIOPLASTY WITH STENT PLACEMENT  06/18/2014   PCI to SVG-RPDA 06/18/14 (Xience Alpine DES 3.0 x 18 mm -3.35 mm),   . CORONARY ARTERY BYPASS GRAFT  12/15/2010   X2, Dr Servando Snare; LIMA to LAD, SVG to  PDA;   . CYSTOSCOPY WITH URETHRAL DILATATION    . IR GENERIC HISTORICAL  01/21/2016   IR RADIOLOGIST EVAL & MGMT 01/21/2016 [facility]  . IR GENERIC HISTORICAL  02/03/2016   IR CATHETER TUBE CHANGE 02/03/2016 Marybelle Killings, MD [facility]INTER[facility] RAD  . LEFT HEART CATHETERIZATION WITH CORONARY ANGIOGRAM N/A 06/18/2014   Procedure: LEFT HEART CATHETERIZATION WITH CORONARY ANGIOGRAM;  Surgeon: Leonie Man, MD;  Location: [facility] CATH LAB;  Service: Cardiovascular;  -- severe disease of SVG-rPDA  . RADIOLOGY WITH ANESTHESIA N/A 01/24/2015   Procedure: STENT ASSISTED ANGIOPLASTY (RADIOLOGY WITH ANESTHESIA);  Surgeon:  Luanne Bras, MD;  Location: [place];  Service: Radiology;  Laterality: N/A;  . TONSILLECTOMY    . TRANSTHORACIC ECHOCARDIOGRAM  07/28/2012; 03/25/2015:   a. EF 55-60%, severe Conc LVH, Nl Systolic fxn, G1 DD,  Ao Sclerosis w/o stenosis; CRO PFO;; b. Moderate concentric LVH. EF 60-65%. No RWMA, Gr 1 DD. Mild LA dilation    Allergies  Allergies  Allergen Reactions  . Atorvastatin     Myalgias  . Crestor [Rosuvastatin]   . Other Other (See Comments)    No  BLOOD PRODUCTS - Pt is Jeohovah's Witness  . Pravastatin     Myalgias  . Simvastatin     Myalgias     History of Present Illness    82 year old male with the above complex past medical history including coronary artery disease status post coronary artery bypass grafting, hypertension, hyperlipidemia, CVA, diabetes, obesity, AAA, and dilated aortic root. Other cardiac history includes a non-STEMI in 2016 requiring drilling stent placement to the vein graft to the PDA. He had recurrent chest pain earlier this month and was admitted with an inferolateral STEMI on November 9. Cath revealed total occlusion of the previously placed stent within the vein graft to the PDA. This was successfully stented. There was some distal embolization with no reflow and he required intracoronary verapamil. Echo showed normal LV function. Since his discharge, he has done well. He has had no recurrence of chest pain or dyspnea. He does not plan to enroll in cardiac rehabilitation because he has to take care of his wife and his mother-in-law at home. He is typically pretty active at home though recognizes that he would be more active that he gone to rehabilitation. He has not been taking Zetia (intolerant to statins), secondary to cost, stating that he is on a pretty fixed income and will not be able to afford it going forward. He denies PND, orthopnea, dizziness, syncope, edema, or early satiety.  Home Medications    Prior to Admission medications     Medication Sig Start Date End Date Taking? Authorizing Provider  amLODipine (NORVASC) 10 MG tablet Take 10 mg by mouth every evening.    Yes Historical Provider, MD  aspirin 81 MG chewable tablet Chew 1 tablet (81 mg total) by mouth daily. 03/29/16  Yes Isaiah Serge, NP  clopidogrel (PLAVIX) 75 MG tablet Take 0.5 tablets (37.5 mg total) by mouth every other day. 12/23/15  Yes Leonie Man, MD  glimepiride (AMARYL) 4 MG tablet Take 4 mg by mouth 2 (two) times daily with a meal.    Yes Historical Provider, MD  hydrALAZINE (APRESOLINE) 50 MG tablet Take 1 tablet (50 mg total) by mouth 2 (two) times daily. 10/29/15  Yes Leonie Man, MD  labetalol (NORMODYNE) 200 MG tablet Take 100 mg by mouth 2 (two) times daily.    Yes Historical Provider, MD  losartan-hydrochlorothiazide [facility]) 100-25  MG per tablet Take 1 tablet by mouth every morning.    Yes Historical Provider, MD  metFORMIN (GLUCOPHAGE) 1000 MG tablet Take 1 tablet (1,000 mg total) by mouth 2 (two) times daily. 03/29/16  Yes Isaiah Serge, NP  nitroGLYCERIN (NITROSTAT) 0.4 MG SL tablet Place 1 tablet (0.4 mg total) under the tongue every 5 (five) minutes as needed for chest pain (up to 3 doses). 06/19/14  Yes Dayna N Dunn, PA-C    Review of Systems    As above, doing well without any recurrence of chest pain or dyspnea. He denies palpitations, PND, orthopnea, dizziness, syncopal, edema, or early satiety.  All other systems reviewed and are otherwise negative except as noted above.  Physical Exam    VS:  BP 135/69   Pulse 64   Ht 5\' 11"  (1.803 m)   Wt 214 lb 3.2 oz (97.2 kg)   BMI 29.87 kg/m  , BMI Body mass index is 29.87 kg/m. GEN: Well nourished, well developed, in no acute distress.  HEENT: normal.  Neck: Supple, no JVD, carotid bruits, or masses. Cardiac: RRR, no murmurs, rubs, or gallops. No clubbing, cyanosis, edema.  Radials/DP/PT 2+ and equal bilaterally. Right groin catheterization site without bleeding, bruit, or  hematoma. Respiratory:  Respirations regular and unlabored, clear to auscultation bilaterally. GI: Soft, nontender, nondistended, BS + x 4. MS: no deformity or atrophy. Skin: warm and dry, no rash. Neuro:  Strength and sensation are intact. Psych: Normal affect.  Accessory Clinical Findings    ECG - Regular sinus rhythm, 65, first-degree AV block, left axis deviation, prior inferior and anterolateral infarcts. No acute ST or T changes.  Assessment & Plan    1.  Inferolateral ST segment elevation myocardial infarction, subsequent episode of care/coronary artery disease: Status post recent admission with chest pain and inferolateral ST elevation. Patient underwent successful stenting of the vein graft to the PDA. He did have distal embolization with no reflow but responded to intracoronary verapamil. Following discharge, he has done well without recurrence of chest pain or dyspnea. He remains on aspirin, Plavix, beta blocker, and ARB therapy. Of note, he did have acute kidney injury during his hospitalization and I will follow-up a basic metabolic panel today. He does not think he will pursue cardiac rehabilitation secondary to demands at home including taking care of his elderly wife and her elderly mother. Unfortunately, he did not tolerate statins and cannot afford Zetia.  2. Hypertension: Blood pressure is stable. Continue current regimen.  3. Hyperlipidemia: He is intolerant to statins and cannot afford Zetia. We do not have samples. LDL is 89. We discussed trying as best he can to adhere to a low-fat low-cholesterol diet and also focus on calorie reduction and activity increase with an effort to lose weight.  4. History of AAA: He needs follow-up abdominal ultrasound early next year.  5. Type 2 diabetes mellitus: He is on oral therapy and managed by primary care.  6. Acute kidney injury: This was noted during hospital Zetia and following catheterization. Follow-up basic metabolic panel  today.  7. Dilated aortic root: Noted on recent echo. Plan follow-up echo in a year.  8. Disposition: Follow-up with Dr. Ellyn Hack in 2-3 months. Follow-up basic metabolic panel today.   [name], NP 04/02/2016, 12:07 PM

## 2016-04-02 NOTE — Patient Instructions (Signed)
Your physician recommends that you return for lab work TODAY.  Ignacia Bayley, NP recommends that you schedule a follow-up appointment in 2-3 months with Dr Ellyn Hack.  If you need a refill on your cardiac medications before your next appointment, please call your pharmacy.

## 2016-04-05 DIAGNOSIS — I2119 ST elevation (STEMI) myocardial infarction involving other coronary artery of inferior wall: Secondary | ICD-10-CM

## 2016-04-05 HISTORY — DX: ST elevation (STEMI) myocardial infarction involving other coronary artery of inferior wall: I21.19

## 2016-04-15 ENCOUNTER — Other Ambulatory Visit: Payer: Self-pay | Admitting: Cardiology

## 2016-04-15 NOTE — Telephone Encounter (Signed)
Patient is calling to verify Plavix dosing

## 2016-04-15 NOTE — Telephone Encounter (Signed)
Pt calling requesting a refill on clopidogrel 75 mg tablet. Pt stated that he was in the ED and wanted to know if his Plavix had been increased to 1 tablet daily. Please advise

## 2016-04-16 MED ORDER — CLOPIDOGREL BISULFATE 75 MG PO TABS: 37.5000 mg | ORAL_TABLET | ORAL | 11 refills | Status: DC

## 2016-04-16 NOTE — Telephone Encounter (Signed)
Refill sent as requested. 

## 2016-04-16 NOTE — Telephone Encounter (Signed)
Per Berge,PA (04-02-16)and ER (03-29-16)note: Clopidogrel 75mg ; Take 0.5 tablets (37.5 mg total) by mouth every other day.  Pt notified. He also needs refill send to [place], [place]

## 2016-04-21 ENCOUNTER — Other Ambulatory Visit: Payer: Self-pay

## 2016-04-21 MED ORDER — CLOPIDOGREL BISULFATE 75 MG PO TABS: 75.0000 mg | ORAL_TABLET | Freq: Every day | ORAL | 11 refills | Status: DC

## 2016-04-21 NOTE — Telephone Encounter (Signed)
Received clarification request from patient's pharmacy for patient's Plavix. Needed new instructions.  Called patient to clarify medication instructions. He stated he had a heart attack in November and he didn't know how to take his medications upon discharge.  Patient was admitted to [facility] last month for STEMI, had cath with Dr End. Stent placed and Plavix increased to 75 mg daily. This change was made in the inpatient setting and outpatient medications were not updated. Discussed with Skip Mayer and Tommy Medal, PharmD. It was advised to send in new prescription with updated instructions per cath note and Dr Claris Gladden progress note from 03/28/16.  Returned call to patient. Advised patient to start taking Plavix 75 mg - a whole tablet - due to recent stent placement. Patient verbalized understanding and agreed with plan. Patient voiced appreciation for assistance.  Rx(s) with updated instructions sent to pharmacy electronically.

## 2016-05-04 DIAGNOSIS — N35010 Post-traumatic urethral stricture, male, meatal: Secondary | ICD-10-CM | POA: Diagnosis not present

## 2016-05-04 DIAGNOSIS — R3914 Feeling of incomplete bladder emptying: Secondary | ICD-10-CM | POA: Diagnosis not present

## 2016-05-04 DIAGNOSIS — Z85828 Personal history of other malignant neoplasm of skin: Secondary | ICD-10-CM | POA: Diagnosis not present

## 2016-05-04 DIAGNOSIS — L570 Actinic keratosis: Secondary | ICD-10-CM | POA: Diagnosis not present

## 2016-05-04 DIAGNOSIS — L821 Other seborrheic keratosis: Secondary | ICD-10-CM | POA: Diagnosis not present

## 2016-06-08 DIAGNOSIS — R3914 Feeling of incomplete bladder emptying: Secondary | ICD-10-CM | POA: Diagnosis not present

## 2016-06-08 DIAGNOSIS — N35010 Post-traumatic urethral stricture, male, meatal: Secondary | ICD-10-CM | POA: Diagnosis not present

## 2016-06-09 ENCOUNTER — Encounter: Payer: Self-pay | Admitting: Cardiology

## 2016-06-09 ENCOUNTER — Ambulatory Visit (INDEPENDENT_AMBULATORY_CARE_PROVIDER_SITE_OTHER): Payer: Medicare Other | Admitting: Cardiology

## 2016-06-09 VITALS — BP 154/70 | HR 64 | Ht 73.0 in | Wt 219.0 lb

## 2016-06-09 DIAGNOSIS — E1169 Type 2 diabetes mellitus with other specified complication: Secondary | ICD-10-CM

## 2016-06-09 DIAGNOSIS — I25110 Atherosclerotic heart disease of native coronary artery with unstable angina pectoris: Secondary | ICD-10-CM

## 2016-06-09 DIAGNOSIS — I25708 Atherosclerosis of coronary artery bypass graft(s), unspecified, with other forms of angina pectoris: Secondary | ICD-10-CM

## 2016-06-09 DIAGNOSIS — I10 Essential (primary) hypertension: Secondary | ICD-10-CM

## 2016-06-09 DIAGNOSIS — I2119 ST elevation (STEMI) myocardial infarction involving other coronary artery of inferior wall: Secondary | ICD-10-CM

## 2016-06-09 DIAGNOSIS — E785 Hyperlipidemia, unspecified: Secondary | ICD-10-CM

## 2016-06-09 DIAGNOSIS — Z959 Presence of cardiac and vascular implant and graft, unspecified: Secondary | ICD-10-CM

## 2016-06-09 DIAGNOSIS — Z95820 Peripheral vascular angioplasty status with implants and grafts: Secondary | ICD-10-CM

## 2016-06-09 DIAGNOSIS — R600 Localized edema: Secondary | ICD-10-CM

## 2016-06-09 NOTE — Patient Instructions (Signed)
No change with current medications   Dr Ellyn Hack suggest you purchase some support stocking -- 8-10 mmHg knee -hi May purchase at pharmacy or department store, like [employer] or [place] wants you to follow-up in 6 months with Dr Ellyn Hack. You will receive a reminder letter in the mail two months in advance. If you don't receive a letter, please call our office to schedule the follow-up appointment.   If you need a refill on your cardiac medications before your next appointment, please call your pharmacy.

## 2016-06-09 NOTE — Progress Notes (Signed)
PCP: [name] Sake, MD  Clinic Note: Chief Complaint  Patient presents with  . Follow-up    CAD-CABG-non-STEMI, recent STEMI; PCI to SVG-RCA  . Hyperlipidemia    Potential candidate for PCSK9 inhibitor study    HPI: [name] is a 83 y.o. male with a PMH below who presents today for 2 month follow-up for history of CAD-CABG followed by STEMI and PCI of vein graft to PDA. He also has a history of a stroke, diabetes, hypertension, hyperlipidemia..I last saw him in July 2017.  [name] was last seen on November 16 17 but [name] Hodgkins, NP for follow-up of his recent inferolateral STEMI that was on 03/26/2016. -- The stent in the vein graft was totally occluded and reopened.No further anginal pain. Did not do cardiac rehabilitation secondary social issues. Was not taking Zetia secondary to cost.  Recent Hospitalizations:  - STEMI Nov 9 - PCI to ISR of SVG-RCA -  Suprapubic Catheter issue Nov 12 as well as in July  Studies Reviewed: Cath-PCI 03/26/16:  Conclusions: 1. Significant native coronary artery disease, including 70% ostial LAD stenosis, 50% mid LCx lesion, and 99% proximal RCA disease with TIMI-1 flow (chronic per prior cath reports). 2. Patent LIMA to LAD. 3. Acutely occluded SVG to PDA within previously placed stent. 4. Successful PCI to proximal SVG to PDA in-stent restenosis/thrombosis with placement of a Promus Premier 3.0 x 16 mm drug eluting stent (dilated to 3.3 mm) with 0% residual stenosis and TIMI-3 flow. 5. Post-PCI, occlusion of the distal PDA was noted at the site of previous 70% stenosis.  IC verapamil was administered during the intervention to prevent no-reflow.  Tirofiban bolus was also administered.  Given that the patient was chest-pain free, further intervention was not performed   Diagnostic Diagram       Post-Intervention Diagram            2D Echo:November 2017 - Left ventricle: Suggestive of hypokinesis of the distal inferior wall  but the apical 2 chamber view is somewhat forshortened. The  cavity size was normal. There was moderate focal basal and mild concentric hypertrophy. Systolic function was normal. The estimated ejection fraction was in the range of 60% to 65%. Wall motion was normal; there were no regional wall motion abnormalities. There was an increased relative contribution of   atrial contraction to ventricular filling. Doppler parameters are consistent with abnormal left ventricular relaxation (grade 1 diastolic dysfunction). - Aortic valve: Trileaflet; normal thickness, mildly calcified leaflets. There was trivial regurgitation. - Aorta: Aortic root dimension: 46 mm (ED). Ascending aortic diameter: 45 mm (S). - Aortic root: The aortic root was moderately dilated. - Ascending aorta: The ascending aorta was moderately dilated. - Mitral valve: There was trivial regurgitation. - Atrial septum: There was increased thickness of the septum, consistent with lipomatous hypertrophy.   Interval History: [name] presents today really without any major complaints. No further anginal symptoms with rest or exertion since his MI. No major bleeding issues with aspirin and Plavix. No heart failure symptoms of PND, orthopnea, but he does have some mild end of day lower extremity edema that gets better when he puts his feet up. He has occasional positional dizziness but not routinely.  No chest tightness or dyspnea with rest or exertion. No weakness, syncope/near syncope, or TIA/amaurosis fugax symptoms.  He is relatively active doing all of the housework and care for his wife and mother-in-law.  No claudication.  He really only has the residual weakness of  his right hand following his stroke. Thankfully, he is left-handed  ROS: A comprehensive was performed. Review of Systems  Constitutional: Negative for malaise/fatigue.  HENT: Positive for hearing loss. Negative for congestion and nosebleeds.   Eyes: Negative.     Respiratory: Negative for cough, shortness of breath and wheezing.   Cardiovascular:       Per history of present illness  Gastrointestinal: Negative for blood in stool, heartburn and melena.  Genitourinary: Negative for hematuria.  Musculoskeletal: Positive for joint pain (Routine arthritis pains).  Skin: Negative.   Neurological: Positive for dizziness (Occasional positional) and focal weakness (Residual stroke. Right hand). Negative for weakness.  Endo/Heme/Allergies: Does not bruise/bleed easily.  Psychiatric/Behavioral: Negative for depression and memory loss. The patient does not have insomnia.   All other systems reviewed and are negative.   Past Medical History:  Diagnosis Date  . Abdominal aortic aneurysm ([place])    a. Aortic duplex 06/2014: mild aneurysmal dilatation of proximal abdominal aorta measuring 3.4x3.4cm. No sig change from 2012. F/u due 06/2016;   . AKI (acute kidney injury) ([place]) 03/28/2016  . Arthritis    "right knee; never bothered me" (03/26/2016)  . 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 CAD in native artery    a. NSTEMI 11/2010 - CABG x2(LIMA to LAD, SVG to PDA). b. NEG Lexi MV 10/24/13, EF 53%, no perfusion abnormality, septal and apical HK noted. c. NSTEMI 05/2014 - s/p DES to SVG-RPDA 06/18/14 (Xience Alpine DES 3.0 x 18 mm -3.35 mm), EF 60-65; d. 03/2016 STEMI/PCI: LM nl, ost LAD 70%, mLCx 50%, pRCA 95% - mRCA 60%, dRPDA 70%, LIMA->LAD ok, o-p SVG->RPDA 100% (3.0x16 Promus DES overlapping prior stent).  . Diastolic dysfunction    a. 03/2016 Echo: EF 60-65%, no rwma, Gr1 DD, triv AI, Ao root 71mm, Asc Ao 1mm, triv MR.  . Dizziness    a. Carotid duplex 03/2014: mild fibrous plaque, no significant stenosis.  . Foley catheter in place    "been wearing it for a couple months now" (03/26/2016)  . HTN (hypertension)   . Hyperlipidemia LDL goal <70   . Non-STEMI (non-ST elevated myocardial infarction) ([place]) 11/2010   1. Ostial LAD  70% (to close to [facility] for PCI), subtotal occlusion of the RCA  . Postoperative atrial fibrillation ([place]) 11/2010   Post CABG, no sign recurrence  . Refusal of blood transfusions as patient is Jehovah's Witness   . 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 Servando Snare)  . Sleep apnea    Not on CPAP. (03/26/2016)  . ST elevation myocardial infarction (STEMI) of inferior wall ([place]) 04/05/2016   Occluded in-stent restenosis/thrombosis of SVG-RCA --> treated with overlapping Promus DES 3.0 mm 16 mm postdilated 3.0 mm).  . Stroke [facility]) 2014; 01/2015   a. 2014 with mild right hand weakness, nonhemorrhagic per pt.;; b. - PTA-Stent L ICA 95%   . Type II diabetes mellitus ([place])     Past Surgical History:  Procedure Laterality Date  . APPENDECTOMY    . CARDIAC CATHETERIZATION  12/11/2010   Dr. Chase Picket - subsequent cath - normal LV systolic function, no renal artery stenosis, severe 2-vessel disease with subtotaled RCA prox and distal 60% lesion and complex 70% area of narrowing of ostium of LAD  . CARDIAC CATHETERIZATION N/A 03/26/2016   Procedure: Left Heart Cath and Coronary Angiography;  Surgeon: Nelva Bush, MD;  Location: [place] CV LAB;  Service:  Cardiovascular;  Laterality: N/A;  . CARDIAC CATHETERIZATION N/A 03/26/2016   Procedure: Coronary Stent Intervention;  Surgeon: Nelva Bush, MD;  Location: [place] CV LAB;  Service: Cardiovascular: 100% In-stent thrombosis of pros SVG-RCA (Xience DES) --> treated with PromusDES 3.0 x 18 (3.3 mm)  . CARDIAC CATHETERIZATION N/A 03/26/2016   Procedure: Bypass Graft Angiography;  Surgeon: Nelva Bush, MD;  Location: [place] CV LAB;  Service: Cardiovascular;  Laterality: N/A;  . CORONARY ANGIOPLASTY WITH STENT PLACEMENT  06/18/2014   PCI to SVG-RPDA 06/18/14 (Xience Alpine DES 3.0 x 18 mm -3.35 mm),   . CORONARY ARTERY BYPASS GRAFT  12/15/2010   X2, Dr Servando Snare; LIMA to LAD, SVG to PDA;   . CYSTOSCOPY WITH URETHRAL DILATATION    .  IR GENERIC HISTORICAL  01/21/2016   IR RADIOLOGIST EVAL & MGMT 01/21/2016 [facility]-INTERV RAD  . IR GENERIC HISTORICAL  02/03/2016   IR CATHETER TUBE CHANGE 02/03/2016 Marybelle Killings, MD [facility]-INTERV RAD  . LEFT HEART CATHETERIZATION WITH CORONARY ANGIOGRAM N/A 06/18/2014   Procedure: LEFT HEART CATHETERIZATION WITH CORONARY ANGIOGRAM;  Surgeon: Leonie Man, MD;  Location: [facility] CATH LAB;  Service: Cardiovascular;  -- severe disease of SVG-rPDA  . RADIOLOGY WITH ANESTHESIA N/A 01/24/2015   Procedure: STENT ASSISTED ANGIOPLASTY (RADIOLOGY WITH ANESTHESIA);  Surgeon: Luanne Bras, MD;  Location: [place];  Service: Radiology;  Laterality: N/A;  . TONSILLECTOMY    . TRANSTHORACIC ECHOCARDIOGRAM  07/28/2012; 03/25/2015:   a. EF 55-60%, severe Conc LVH, Nl Systolic fxn, G1 DD,  Ao Sclerosis w/o stenosis; CRO PFO;; b. Moderate concentric LVH. EF 60-65%. No RWMA, Gr 1 DD. Mild LA dilation  . TRANSTHORACIC ECHOCARDIOGRAM  03/2016   Hypokinesis of the distal inferior wall. On her concentric hypertrophy. EF 60-65%.  GR 1 DD. Ascending aorta/root 45-46 mm.    Current Meds  Medication Sig  . amLODipine (NORVASC) 10 MG tablet Take 10 mg by mouth every evening.   Marland Kitchen aspirin 81 MG chewable tablet Chew 1 tablet (81 mg total) by mouth daily.  . clopidogrel (PLAVIX) 75 MG tablet Take 75 mg by mouth daily.  Marland Kitchen glimepiride (AMARYL) 4 MG tablet Take 4 mg by mouth 2 (two) times daily with a meal.   . hydrALAZINE (APRESOLINE) 50 MG tablet Take 1 tablet (50 mg total) by mouth 2 (two) times daily.  Marland Kitchen labetalol (NORMODYNE) 200 MG tablet Take 100 mg by mouth 2 (two) times daily.   Marland Kitchen losartan-hydrochlorothiazide (HYZAAR) 100-25 MG per tablet Take 1 tablet by mouth every morning.   . metFORMIN (GLUCOPHAGE) 1000 MG tablet Take 1 tablet (1,000 mg total) by mouth 2 (two) times daily.  . nitroGLYCERIN (NITROSTAT) 0.4 MG SL tablet Place 1 tablet (0.4 mg total) under the tongue every 5 (five) minutes as needed for chest pain (up to 3 doses).     Allergies  Allergen Reactions  . Atorvastatin     Myalgias  . Crestor [Rosuvastatin]   . Other Other (See Comments)    No  BLOOD PRODUCTS - Pt is Jeohovah's Witness  . Pravastatin     Myalgias  . Simvastatin     Myalgias     Social History   Social History  . Marital status: Married    Spouse name: [name]  . Number of children: 1  . Years of education: N/A   Occupational History  .  Retired   Social History Main Topics  . Smoking status: Former Smoker    Packs/day: 0.33    Years:  10.00    Types: Cigarettes    Quit date: 1961  . Smokeless tobacco: Never Used  . Alcohol use No  . Drug use: No  . Sexual activity: No   Other Topics Concern  . None   Social History Narrative   Married father of one.  Previously uses stationary bike routinely.  Now reduced due to other social stressors.   Quit smoking 50 years ago.  Does not drink alcohol    family history includes Heart Problems (age of onset: 41) in his father.  Wt Readings from Last 3 Encounters:  06/09/16 99.3 kg (219 lb)  04/02/16 97.2 kg (214 lb 3.2 oz)  03/26/16 99.8 kg (220 lb)    PHYSICAL EXAM BP (!) 154/70   Pulse 64   Ht 6\' 1"  (1.854 m)   Wt 99.3 kg (219 lb)   SpO2 95%   BMI 28.89 kg/m  General appearance: alert, cooperative, appears stated age, no distress and mildly obese HEENT: [place]/AT, EOMI, MMM, anicteric sclera Neck: no adenopathy, no carotid bruit and no JVD Lungs: clear to auscultation bilaterally, normal percussion bilaterally and non-labored Heart: regular rate and rhythm, S1, S2 normal, soft 1/6 SEM at RUSB radiating to carotids, click, rub or gallop ; nondisplaced PMI Abdomen: soft, non-tender; bowel sounds normal; no masses, no organomegaly;  Extremities: extremities normal, atraumatic, no cyanosis, and ~1+ bilateral RLE edema; right hand has some mild contracture from prior stroke Pulses: 2+ and symmetric;  Neurologic: Mental status: Alert, oriented, thought content  appropriate; Notable plegia & decreased sensation of R hand from wrist.     Adult ECG Report n/a  Other studies Reviewed: Additional studies/ records that were reviewed today include:  Recent Labs:   Lab Results  Component Value Date   CHOL 152 03/26/2016   HDL 47 03/26/2016   LDLCALC 89 03/26/2016   TRIG 80 03/26/2016   CHOLHDL 3.2 03/26/2016    ASSESSMENT / PLAN: Problem List Items Addressed This Visit    Coronary artery disease involving native coronary artery (Chronic)    Severe native CAD. LIMA-LAD patent and native circumflex relatively normal. Essentially occluded RCA overlapping stents and SVG-PDA.  On aspirin, Plavix, beta blocker, ARB and calcium channel blocker.      Moderate essential hypertension (Chronic)    Blood pressure is still a little higher than expected, but at home it is usually relatively well-controlled. For now we'll just continue with current medications, but normally to closely monitor to see if we can increase the hydralazine dose if necessary.  Again I have wanted to convert from labetalol to carvedilol, but for some reason he continues to be on labetalol despite efforts to convert.      Hyperlipidemia associated with type 2 diabetes mellitus ([place]) (Chronic)    He has not been able to take atorvastatin, simvastatin and now rosuvastatin. His lipids are relatively controlled, but still not at goal of less than 70.  I am referring him to our pharmacist run lipid clinic to be considered for possible PCSK9 inhibitor trial.      Bilateral lower extremity edema (Chronic)    Does not seem to be related to edema from heart failure. Probably more related to venous stasis. I recommended that he wear support stockings again, he had not yet used them. He is no longer on Lasix, because he is on HCTZ.      Coronary artery disease involving coronary bypass graft of native heart with other forms of angina pectoris (HCC) (Chronic)  Unfortunately, recurrent  stenosis of the stented segment in the SVG-RCA. Hopefully now he can have a couple years without any complications.  The plan and been to do a Myoview this year, but since he has just had a heart catheterization, I think we can wait at least another year.  Plan: Continue aspirin and Plavix. He is on labetalol and amlodipine as well as ARB. Not currently on statin due to intolerance.      STEMI (ST elevation myocardial infarction) ([place]) - Primary    Inferior STEMI secondary to in-stent thrombosis of stent placed for non-STEMI in 2016. Thankfully, he has a relatively preserved EF with maybe some mild inferior hypokinesis that is easily explained by his existing CAD. No heart failure symptoms. No recurrent anginal symptoms.      S/P angioplasty with stent 03/26/16 to VG to PDA for in-stent restenosis with DES.    On aspirin and Plavix.         Current medicines are reviewed at length with the patient today. (+/- concerns) n/a - unable to afford Zetia The following changes have been made: n/a  Patient Instructions  No change with current medications   Dr Ellyn Hack suggest you purchase some support stocking -- 8-10 mmHg knee -hi May purchase at pharmacy or department store, like [employer] or [place] wants you to follow-up in 6 months with Dr Ellyn Hack. You will receive a reminder letter in the mail two months in advance. If you don't receive a letter, please call our office to schedule the follow-up appointment.   If you need a refill on your cardiac medications before your next appointment, please call your pharmacy.    Studies Ordered:   No orders of the defined types were placed in this encounter.     Glenetta Hew, M.D., M.S. Interventional Cardiologist   Pager # [phone] Phone # [phone] [address] [place], [place] [address]

## 2016-06-10 ENCOUNTER — Encounter: Payer: Self-pay | Admitting: Cardiology

## 2016-06-10 NOTE — Assessment & Plan Note (Signed)
On aspirin and Plavix.

## 2016-06-10 NOTE — Assessment & Plan Note (Signed)
Blood pressure is still a little higher than expected, but at home it is usually relatively well-controlled. For now we'll just continue with current medications, but normally to closely monitor to see if we can increase the hydralazine dose if necessary.  Again I have wanted to convert from labetalol to carvedilol, but for some reason he continues to be on labetalol despite efforts to convert.

## 2016-06-10 NOTE — Assessment & Plan Note (Signed)
Inferior STEMI secondary to in-stent thrombosis of stent placed for non-STEMI in 2016. Thankfully, he has a relatively preserved EF with maybe some mild inferior hypokinesis that is easily explained by his existing CAD. No heart failure symptoms. No recurrent anginal symptoms.

## 2016-06-10 NOTE — Assessment & Plan Note (Signed)
Severe native CAD. LIMA-LAD patent and native circumflex relatively normal. Essentially occluded RCA overlapping stents and SVG-PDA.  On aspirin, Plavix, beta blocker, ARB and calcium channel blocker.

## 2016-06-10 NOTE — Assessment & Plan Note (Signed)
Unfortunately, recurrent stenosis of the stented segment in the SVG-RCA. Hopefully now he can have a couple years without any complications.  The plan and been to do a Myoview this year, but since he has just had a heart catheterization, I think we can wait at least another year.  Plan: Continue aspirin and Plavix. He is on labetalol and amlodipine as well as ARB. Not currently on statin due to intolerance.

## 2016-06-10 NOTE — Assessment & Plan Note (Signed)
Does not seem to be related to edema from heart failure. Probably more related to venous stasis. I recommended that he wear support stockings again, he had not yet used them. He is no longer on Lasix, because he is on HCTZ.

## 2016-06-10 NOTE — Assessment & Plan Note (Signed)
He has not been able to take atorvastatin, simvastatin and now rosuvastatin. His lipids are relatively controlled, but still not at goal of less than 70.  I am referring him to our pharmacist run lipid clinic to be considered for possible PCSK9 inhibitor trial.

## 2016-06-24 ENCOUNTER — Encounter: Payer: Self-pay | Admitting: Neurology

## 2016-06-24 ENCOUNTER — Ambulatory Visit (INDEPENDENT_AMBULATORY_CARE_PROVIDER_SITE_OTHER): Payer: Medicare Other | Admitting: Neurology

## 2016-06-24 VITALS — BP 148/74 | HR 50 | Ht 71.0 in | Wt 219.0 lb

## 2016-06-24 DIAGNOSIS — E785 Hyperlipidemia, unspecified: Secondary | ICD-10-CM

## 2016-06-24 DIAGNOSIS — I25708 Atherosclerosis of coronary artery bypass graft(s), unspecified, with other forms of angina pectoris: Secondary | ICD-10-CM | POA: Diagnosis not present

## 2016-06-24 DIAGNOSIS — I10 Essential (primary) hypertension: Secondary | ICD-10-CM

## 2016-06-24 DIAGNOSIS — I63232 Cerebral infarction due to unspecified occlusion or stenosis of left carotid arteries: Secondary | ICD-10-CM | POA: Diagnosis not present

## 2016-06-24 DIAGNOSIS — E118 Type 2 diabetes mellitus with unspecified complications: Secondary | ICD-10-CM

## 2016-06-24 NOTE — Progress Notes (Signed)
STROKE NEUROLOGY FOLLOW UP NOTE  NAME: [name] BROUGHTON DOB: 09/16/1930  REASON FOR VISIT: stroke follow up HISTORY FROM: pt and chart  Today we had the pleasure of seeing [name] in follow-up at our [facility]. Pt was accompanied by no one.   History Summary Mr. [name] is a 83 y.o. male left handed with history of CAD, HTN, DM, HLD was admitted on 01/22/15 for 2 day history of right sided weakness. 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 CTA head and neck showed no thrombosis distal to left petrous ICA stenosis. Pt had no hypotension episodes but hypertension in 180s. Pt had left MCA stroke in 2013 and had dizziness episode in 2015. Combination of the fact of recurrent left MCA stroke and chronic HTN running in 180s, he underwent cerebral angio with angioplasty, no stent required. Repeat MRA post procedure showed stenosis improved to 50%. A1C 7.8 and LDL 61. He was discharged with dual antiplatelet and crestor.   03/21/15 follow up - the patient has been doing well. No recurrent stroke like symptoms. His BP today 155/78. His glucose at home about 100-140. He still has right hand and wrist weakness and he is continued on PT/OT. He was taking crestor 5mg  twice a week but was later discontinued by PCP. Otherwise he is doing well without complains. He has appointment with Dr. Estanislado Pandy to repeat angio.  06/24/15 follow-up - pt has been doing well, no stroke like symptom. He had follow up with Dr. Estanislado Pandy in 05/2014, and had repeat MRA showed stable 50% residue stenosis of the left petrous ICA, and stable 50% left supraclinoid ICA stenosis. P2Y12 has not been checked yet. Right wrist extension and flexion much improved, still has difficulty of right hand. He complains of right hand pain intermittently, relieved by hand brace. BP 133/72.  12/23/2015 follow up - patient has been doing well, no stroke like  symptoms. He has appointment with Dr. Estanislado Pandy this week. Still has right hand weakness and spasticity, no pain, currently doing home exercises. He had urinary retention, went to ER, was put on 40 catheter, is going to follow-up with urology as outpatient on 12/31/2015. BP 150/82.  Interval History During the interval time, pt has been doing well. No stroke like symptoms. Right hand still weak, but he is left handed. Had MRI and MRA 12/2015 showed stable 50% stenosis of left petrous ICA s/p angioplasty. Unchanged intracranial athero. Followed up with Dr. Estanislado Pandy in 01/2016 recommend close monitoring. Glucose still fluctuate at home, last A1C 7.1 and LDL 89. Follows up with cardiology for injectable medications. He had admission on 03/26/16 for CP and found to have STEMI s/p PCI. Today BP 148/74.   REVIEW OF SYSTEMS: Full 14 system review of systems performed and notable only for those listed below and in HPI above, all others are negative:  Constitutional:   Cardiovascular:  Ear/Nose/Throat:   Skin:  Eyes:   Respiratory:   Gastroitestinal:   Genitourinary: difficulty urination Hematology/Lymphatic:   Endocrine:  Musculoskeletal:   Allergy/Immunology:   Neurological:   Psychiatric:  Sleep:   The following represents the patient's updated allergies and side effects list: Allergies  Allergen Reactions  . Atorvastatin     Myalgias  . Crestor [Rosuvastatin]   . Other Other (See Comments)    No  BLOOD PRODUCTS - Pt is [employer]  . Pravastatin     Myalgias  .  Simvastatin     Myalgias     The neurologically relevant items on the patient's problem list were reviewed on today's visit.  Neurologic Examination  A problem focused neurological exam (12 or more points of the single system neurologic examination, vital signs counts as 1 point, cranial nerves count for 8 points) was performed.  Blood pressure (!) 148/74, pulse (!) 50, height 5\' 11"  (1.803 m), weight 219 lb (99.3  kg).  General - Well nourished, well developed, in no apparent distress.  Ophthalmologic - Fundi not visualized due to eye movement.  Cardiovascular - Regular rate and rhythm.  Mental Status -  Level of arousal and orientation to time, place, and person were intact. Language including expression, naming, repetition, comprehension was assessed and found intact. Fund of Knowledge was assessed and was intact.  Cranial Nerves II - XII - II - Visual field intact OU. III, IV, VI - Extraocular movements intact. V - Facial sensation intact bilaterally. VII - Facial movement intact bilaterally. VIII - Hearing & vestibular intact bilaterally. X - Palate elevates symmetrically. XI - Chin turning & shoulder shrug intact bilaterally. XII - Tongue protrusion intact.  Motor Strength - The patient's strength was normal in all extremities except right wrist 5-/5 extension and 4/5  flexion, right hand 3-/5 grip, and pronator drift was absent.  Bulk was normal and fasciculations were absent.   Motor Tone - Muscle tone was assessed at the neck and appendages and was normal.  Reflexes - The patient's reflexes were 1+ in all extremities and he had no pathological reflexes.  Sensory - Light touch, temperature/pinprick were assessed and were decreased at right hand, 50% of left hand.    Coordination - The patient had normal movements in the hands and feet with no ataxia or dysmetria.  Tremor was absent.  Gait and Station - slow cautious broad-based gait but steady.  Data reviewed: I personally reviewed the images and agree with the radiology interpretations.  Dg Chest 2 View 01/22/2015 No active cardiopulmonary disease.   Ct Head Wo Contrast 01/22/2015 1. No acute intracranial pathology. 2. Chronic microvascular disease and cerebral atrophy.   Ct Angio Head & Neck W/cm &/or Wo/cm 01/22/2015 High-grade stenosis of the skullbase LEFT ICA at the junction of the horizontal petrous and inferior  cavernous segments estimated 90%. No intraluminal filling defect to suggest thrombus. The MR finding of a possible filling defect in the LEFT ICA relates to turbulent flow. Chronically occluded RIGHT vertebral artery. Greater than 50% stenosis proximal LEFT ICA, not clearly flow reducing. This tandem lesion does further compromise flow to the LEFT anterior circulation.   Mri & Mra Head Wo Contrast 01/22/2015 Punctate foci of acute infarctions scattered throughout the anterior circulation distribution on the left consistent with micro embolic infarctions. Some of these could be watershed. Chronic small vessel ischemic changes elsewhere throughout the brain. Old left parietal cortical infarction. Severe focal stenosis of the left petrous carotid, 90% or greater. Possible filling defect distal to that which could be intraluminal thrombus or signal loss from severe turbulence. Stenoses in the anterior circulation intracranial branches, left worse than right. On the left, these could be atherosclerotic stenoses or conceivably due to nonocclusive embolic disease. Chronically occluded right vertebral.   05/30/15 1. No acute intracranial abnormality. 2. Chronic infarcts and small vessel ischemic disease as above. 3. Unchanged head MRA. Less than 50% residual stenosis of the left petrous ICA. 4. 50% left supraclinoid ICA stenosis. 5. Mild distal right M1 and moderate  to severe left MCA bifurcation stenoses. 6. Moderate left A1 ACA stenosis and chronic left A2 occlusion. 7. Mild mid basilar artery stenosis. 8. Moderate to severe bilateral PCA branch vessel stenoses.  2D echo - - Left ventricle: The cavity size was normal. There was moderate concentric hypertrophy. Systolic function was normal. The estimated ejection fraction was in the range of 60% to 65%. Wall motion was normal; there were no regional wall motion abnormalities. Doppler parameters are consistent with abnormal left  ventricular relaxation (grade 1 diastolic dysfunction). - Aortic valve: Trileaflet; normal thickness leaflets. - Aortic root: The aortic root was normal in size. - Left atrium: The atrium was mildly dilated. - Right ventricle: Systolic function was normal. - Right atrium: The atrium was normal in size. - Tricuspid valve: There was mild regurgitation. - Pulmonic valve: There was no regurgitation. - Pulmonary arteries: Systolic pressure was within the normal range. - Inferior vena cava: The vessel was normal in size. - Pericardium, extracardiac: There was no pericardial effusion.  Cerebral angio - S/P LT ICA intracranial angioplasty for severe symptomatic stenosis.with <20 % stenosis post angioplasty.  MRI and MRA head 12/26/15 1. No acute intracranial abnormality. 2. Stable chronic ischemic changes as above. 3. New moderate proximal left P2 stenosis. 4. Unchanged intracranial atherosclerosis elsewhere including severe left MCA bifurcation and left A2 stenoses. 5. Unchanged less than 50% residual narrowing of the left petrous ICA at site of prior angioplasty.   Component     Latest Ref Rng 06/27/2014 01/22/2015 01/23/2015 01/24/2015  Cholesterol     0 - 200 mg/dL  110    Triglycerides     <150 mg/dL  24    HDL Cholesterol     >40 mg/dL  44    Total CHOL/HDL Ratio       2.5    VLDL     0 - 40 mg/dL  5    LDL (calc)     0 - 99 mg/dL  61    Hemoglobin A1C     4.8 - 5.6 %  7.8 (H)    Mean Plasma Glucose       177    TSH     0.350 - 4.500 uIU/mL 2.651     HIV     Non Reactive   Non Reactive   Platelet Function  P2Y12     194 - 418 PRU    148 (L)   Component     Latest Ref Rng & Units 03/26/2016  Cholesterol     0 - 200 mg/dL 152  Triglycerides     <150 mg/dL 80  HDL Cholesterol     >40 mg/dL 47  Total CHOL/HDL Ratio     RATIO 3.2  VLDL     0 - 40 mg/dL 16  LDL (calc)     0 - 99 mg/dL 89  Hemoglobin A1C     4.8 - 5.6 % 7.1 (H)  Mean Plasma Glucose     mg/dL 157      Assessment: As you may recall, he is a 83 y.o. 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 CTA head and neck showed no thrombosis distal to left petrous ICA stenosis. Pt had left MCA stroke in 2013 and had dizziness episode in 2015. Combination of the fact of recurrent left MCA stroke and chronic  HTN running in 180s, he underwent cerebral angio with angioplasty, no stent required. Repeat MRA post procedure showed stenosis improved to 50%. A1C 7.8 and LDL 61. He was discharged with dual antiplatelet and crestor. During the interval time, the patient has been doing well. No recurrent stroke like symptoms. He was taking crestor 5mg  twice a week but was later discontinued by PCP due to intolarence. He followed up with Dr. Estanislado Pandy and repeat MRA in 05/2015 and 12/2015 showed stable left ICA petrous 50% and supraclinoid 50% stenosis. Recent A1C 7.1 and LDL 89, following with cardiology for PCSK9 inhibitors. Had STEMI in 03/2016 s/p PCI. Continued on DAPT.   Plan:  - Continue ASA and plavix for stroke prevention - follow up with cardiology for HLD treatment with PCSK inhibitors.  - check BP at home, goal 120-150 due to ICA stenosis - check glucose at home - Follow up with primary care physician for stroke risk factor modification. Recommend maintain blood pressure goal 120-150, diabetes with hemoglobin A1c goal below 6.5% and lipids with LDL cholesterol goal below 70 mg/dL.  - continue to follow up with Dr. Estanislado Pandy  - exercise with right hand at home. - follow up in 6 months  I spent more than 25 minutes of face to face time with the patient. Greater than 50% of time was spent in counseling and coordination of care. We reviewed MRA head, discussed about BP and glucose control, and right hand home exercises.   No orders of the defined types were placed  in this encounter.   No orders of the defined types were placed in this encounter.   Patient Instructions  - Continue ASA and plavix for stroke prevention - follow up with cardiology for HLD treatment with injectable medications.  - check BP at home, goal 120-150 - check glucose at home - Follow up with primary care physician for stroke risk factor modification. Recommend maintain blood pressure goal 120-150, diabetes with hemoglobin A1c goal below 6.5% and lipids with LDL cholesterol goal below 70 mg/dL.  - continue to follow up with Dr. Estanislado Pandy to monitor vessel narrowing - exercise with right hand at home. - follow up in 6 months with me.   Rosalin Hawking, MD PhD [facility] [address], [place], [place] [address] [phone]

## 2016-06-24 NOTE — Patient Instructions (Signed)
-   Continue ASA and plavix for stroke prevention - follow up with cardiology for HLD treatment with injectable medications.  - check BP at home, goal 120-150 - check glucose at home - Follow up with primary care physician for stroke risk factor modification. Recommend maintain blood pressure goal 120-150, diabetes with hemoglobin A1c goal below 6.5% and lipids with LDL cholesterol goal below 70 mg/dL.  - continue to follow up with Dr. Estanislado Pandy to monitor vessel narrowing - exercise with right hand at home. - follow up in 6 months with me.

## 2016-07-14 DIAGNOSIS — R3914 Feeling of incomplete bladder emptying: Secondary | ICD-10-CM | POA: Diagnosis not present

## 2016-07-17 ENCOUNTER — Other Ambulatory Visit (HOSPITAL_COMMUNITY): Payer: Self-pay | Admitting: Interventional Radiology

## 2016-07-17 DIAGNOSIS — I2510 Atherosclerotic heart disease of native coronary artery without angina pectoris: Secondary | ICD-10-CM | POA: Diagnosis not present

## 2016-07-17 DIAGNOSIS — I69359 Hemiplegia and hemiparesis following cerebral infarction affecting unspecified side: Secondary | ICD-10-CM | POA: Diagnosis not present

## 2016-07-17 DIAGNOSIS — Z79899 Other long term (current) drug therapy: Secondary | ICD-10-CM | POA: Diagnosis not present

## 2016-07-17 DIAGNOSIS — I771 Stricture of artery: Secondary | ICD-10-CM

## 2016-07-17 DIAGNOSIS — E1129 Type 2 diabetes mellitus with other diabetic kidney complication: Secondary | ICD-10-CM | POA: Diagnosis not present

## 2016-07-17 DIAGNOSIS — Z6829 Body mass index (BMI) 29.0-29.9, adult: Secondary | ICD-10-CM | POA: Diagnosis not present

## 2016-07-17 DIAGNOSIS — I10 Essential (primary) hypertension: Secondary | ICD-10-CM | POA: Diagnosis not present

## 2016-07-17 DIAGNOSIS — Z1389 Encounter for screening for other disorder: Secondary | ICD-10-CM | POA: Diagnosis not present

## 2016-07-17 DIAGNOSIS — Z9181 History of falling: Secondary | ICD-10-CM | POA: Diagnosis not present

## 2016-07-21 ENCOUNTER — Telehealth: Payer: Self-pay | Admitting: Cardiology

## 2016-07-21 NOTE — Telephone Encounter (Signed)
Encounter was opened on wrong patient. I have note for this patient's spouse.

## 2016-07-21 NOTE — Telephone Encounter (Signed)
New message      Pt states he is returning a call to the nurse.  If possible, please call before you leave for the day

## 2016-07-28 ENCOUNTER — Other Ambulatory Visit (HOSPITAL_COMMUNITY): Payer: Self-pay | Admitting: Interventional Radiology

## 2016-07-28 ENCOUNTER — Encounter (HOSPITAL_COMMUNITY): Payer: Self-pay

## 2016-07-28 ENCOUNTER — Ambulatory Visit (HOSPITAL_COMMUNITY)
Admission: RE | Admit: 2016-07-28 | Discharge: 2016-07-28 | Disposition: A | Discharge status: Home / Self Care | Payer: Medicare Other | Source: Ambulatory Visit | Attending: Interventional Radiology | Admitting: Interventional Radiology

## 2016-07-28 ENCOUNTER — Ambulatory Visit (HOSPITAL_COMMUNITY): Payer: Medicare Other

## 2016-07-28 DIAGNOSIS — I679 Cerebrovascular disease, unspecified: Secondary | ICD-10-CM

## 2016-07-28 DIAGNOSIS — I6522 Occlusion and stenosis of left carotid artery: Secondary | ICD-10-CM | POA: Insufficient documentation

## 2016-07-28 DIAGNOSIS — I771 Stricture of artery: Secondary | ICD-10-CM | POA: Insufficient documentation

## 2016-07-28 DIAGNOSIS — I6782 Cerebral ischemia: Secondary | ICD-10-CM | POA: Insufficient documentation

## 2016-07-28 DIAGNOSIS — I6602 Occlusion and stenosis of left middle cerebral artery: Secondary | ICD-10-CM | POA: Diagnosis not present

## 2016-07-28 DIAGNOSIS — I6521 Occlusion and stenosis of right carotid artery: Secondary | ICD-10-CM

## 2016-07-28 LAB — CREATININE, SERUM
CREATININE: 1.13 mg/dL (ref 0.61–1.24)
GFR calc Af Amer: >60 mL/min (ref >60)
GFR, EST NON AFRICAN AMERICAN: 58 mL/min — AB (ref >60)

## 2016-07-28 MED ORDER — GADOBENATE DIMEGLUMINE 529 MG/ML IV SOLN
20.0000 mL | Freq: Once | INTRAVENOUS | Status: AC
  Administered 2016-07-28: 20 mL via INTRAVENOUS

## 2016-07-29 DIAGNOSIS — R310 Gross hematuria: Secondary | ICD-10-CM | POA: Diagnosis not present

## 2016-08-05 DIAGNOSIS — M25511 Pain in right shoulder: Secondary | ICD-10-CM | POA: Diagnosis not present

## 2016-08-05 DIAGNOSIS — R52 Pain, unspecified: Secondary | ICD-10-CM | POA: Diagnosis not present

## 2016-08-05 DIAGNOSIS — I10 Essential (primary) hypertension: Secondary | ICD-10-CM | POA: Diagnosis not present

## 2016-08-05 DIAGNOSIS — M7551 Bursitis of right shoulder: Secondary | ICD-10-CM | POA: Diagnosis not present

## 2016-08-06 ENCOUNTER — Telehealth (HOSPITAL_COMMUNITY): Payer: Self-pay

## 2016-08-06 NOTE — Telephone Encounter (Signed)
Called, number in system not valid. AW

## 2016-08-10 DIAGNOSIS — M25511 Pain in right shoulder: Secondary | ICD-10-CM | POA: Diagnosis not present

## 2016-08-10 DIAGNOSIS — R52 Pain, unspecified: Secondary | ICD-10-CM | POA: Diagnosis not present

## 2016-08-10 DIAGNOSIS — M2570 Osteophyte, unspecified joint: Secondary | ICD-10-CM | POA: Diagnosis not present

## 2016-08-11 ENCOUNTER — Other Ambulatory Visit: Payer: Self-pay

## 2016-08-11 DIAGNOSIS — R3914 Feeling of incomplete bladder emptying: Secondary | ICD-10-CM | POA: Diagnosis not present

## 2016-08-11 MED ORDER — CLOPIDOGREL BISULFATE 75 MG PO TABS: 75.0000 mg | ORAL_TABLET | Freq: Every day | ORAL | 1 refills | Status: DC

## 2016-08-12 DIAGNOSIS — M5412 Radiculopathy, cervical region: Secondary | ICD-10-CM | POA: Diagnosis not present

## 2016-08-15 DIAGNOSIS — M5412 Radiculopathy, cervical region: Secondary | ICD-10-CM | POA: Diagnosis not present

## 2016-08-15 DIAGNOSIS — M542 Cervicalgia: Secondary | ICD-10-CM | POA: Diagnosis not present

## 2016-08-17 DIAGNOSIS — N182 Chronic kidney disease, stage 2 (mild): Secondary | ICD-10-CM | POA: Diagnosis not present

## 2016-08-25 DIAGNOSIS — M5412 Radiculopathy, cervical region: Secondary | ICD-10-CM | POA: Diagnosis not present

## 2016-08-31 ENCOUNTER — Encounter (HOSPITAL_COMMUNITY): Payer: Self-pay | Admitting: Radiology

## 2016-09-04 DIAGNOSIS — M50122 Cervical disc disorder at C5-C6 level with radiculopathy: Secondary | ICD-10-CM | POA: Diagnosis not present

## 2016-09-04 DIAGNOSIS — M5412 Radiculopathy, cervical region: Secondary | ICD-10-CM | POA: Diagnosis not present

## 2016-09-05 DIAGNOSIS — Z9359 Other cystostomy status: Secondary | ICD-10-CM

## 2016-09-05 DIAGNOSIS — I10 Essential (primary) hypertension: Secondary | ICD-10-CM

## 2016-09-05 DIAGNOSIS — Z951 Presence of aortocoronary bypass graft: Secondary | ICD-10-CM

## 2016-09-05 DIAGNOSIS — Z8673 Personal history of transient ischemic attack (TIA), and cerebral infarction without residual deficits: Secondary | ICD-10-CM

## 2016-09-05 DIAGNOSIS — Z87438 Personal history of other diseases of male genital organs: Secondary | ICD-10-CM

## 2016-09-05 DIAGNOSIS — E119 Type 2 diabetes mellitus without complications: Secondary | ICD-10-CM

## 2016-09-05 DIAGNOSIS — I2510 Atherosclerotic heart disease of native coronary artery without angina pectoris: Secondary | ICD-10-CM

## 2016-09-05 DIAGNOSIS — I6789 Other cerebrovascular disease: Secondary | ICD-10-CM | POA: Diagnosis not present

## 2016-09-05 DIAGNOSIS — G459 Transient cerebral ischemic attack, unspecified: Secondary | ICD-10-CM

## 2016-09-05 DIAGNOSIS — E785 Hyperlipidemia, unspecified: Secondary | ICD-10-CM

## 2016-09-05 DIAGNOSIS — R29810 Facial weakness: Secondary | ICD-10-CM | POA: Diagnosis not present

## 2016-09-06 DIAGNOSIS — I2510 Atherosclerotic heart disease of native coronary artery without angina pectoris: Secondary | ICD-10-CM | POA: Diagnosis not present

## 2016-09-06 DIAGNOSIS — Z9359 Other cystostomy status: Secondary | ICD-10-CM | POA: Diagnosis not present

## 2016-09-06 DIAGNOSIS — G459 Transient cerebral ischemic attack, unspecified: Secondary | ICD-10-CM | POA: Diagnosis not present

## 2016-09-06 DIAGNOSIS — Z951 Presence of aortocoronary bypass graft: Secondary | ICD-10-CM | POA: Diagnosis not present

## 2016-09-06 DIAGNOSIS — Z87438 Personal history of other diseases of male genital organs: Secondary | ICD-10-CM | POA: Diagnosis not present

## 2016-09-06 DIAGNOSIS — E119 Type 2 diabetes mellitus without complications: Secondary | ICD-10-CM | POA: Diagnosis not present

## 2016-09-06 DIAGNOSIS — E785 Hyperlipidemia, unspecified: Secondary | ICD-10-CM | POA: Diagnosis not present

## 2016-09-06 DIAGNOSIS — Z8673 Personal history of transient ischemic attack (TIA), and cerebral infarction without residual deficits: Secondary | ICD-10-CM | POA: Diagnosis not present

## 2016-09-06 DIAGNOSIS — I10 Essential (primary) hypertension: Secondary | ICD-10-CM | POA: Diagnosis not present

## 2016-09-08 ENCOUNTER — Telehealth (HOSPITAL_COMMUNITY): Payer: Self-pay

## 2016-09-08 DIAGNOSIS — R3914 Feeling of incomplete bladder emptying: Secondary | ICD-10-CM | POA: Diagnosis not present

## 2016-09-08 NOTE — Telephone Encounter (Signed)
Pt agreed to f/u in 6 months with mri/mra. AW  

## 2016-09-14 DIAGNOSIS — Z6829 Body mass index (BMI) 29.0-29.9, adult: Secondary | ICD-10-CM | POA: Diagnosis not present

## 2016-09-14 DIAGNOSIS — I69359 Hemiplegia and hemiparesis following cerebral infarction affecting unspecified side: Secondary | ICD-10-CM | POA: Diagnosis not present

## 2016-09-14 DIAGNOSIS — M5412 Radiculopathy, cervical region: Secondary | ICD-10-CM | POA: Diagnosis not present

## 2016-09-14 DIAGNOSIS — G459 Transient cerebral ischemic attack, unspecified: Secondary | ICD-10-CM | POA: Diagnosis not present

## 2016-09-14 DIAGNOSIS — I10 Essential (primary) hypertension: Secondary | ICD-10-CM | POA: Diagnosis not present

## 2016-09-14 DIAGNOSIS — I2510 Atherosclerotic heart disease of native coronary artery without angina pectoris: Secondary | ICD-10-CM | POA: Diagnosis not present

## 2016-09-22 DIAGNOSIS — E119 Type 2 diabetes mellitus without complications: Secondary | ICD-10-CM | POA: Diagnosis not present

## 2016-09-22 DIAGNOSIS — H5203 Hypermetropia, bilateral: Secondary | ICD-10-CM | POA: Diagnosis not present

## 2016-09-22 DIAGNOSIS — H524 Presbyopia: Secondary | ICD-10-CM | POA: Diagnosis not present

## 2016-09-22 DIAGNOSIS — H52223 Regular astigmatism, bilateral: Secondary | ICD-10-CM | POA: Diagnosis not present

## 2016-09-24 DIAGNOSIS — M5412 Radiculopathy, cervical region: Secondary | ICD-10-CM | POA: Diagnosis not present

## 2016-09-25 DIAGNOSIS — H26492 Other secondary cataract, left eye: Secondary | ICD-10-CM | POA: Diagnosis not present

## 2016-10-06 DIAGNOSIS — N35010 Post-traumatic urethral stricture, male, meatal: Secondary | ICD-10-CM | POA: Diagnosis not present

## 2016-10-06 DIAGNOSIS — N35013 Post-traumatic anterior urethral stricture: Secondary | ICD-10-CM | POA: Diagnosis not present

## 2016-10-13 DIAGNOSIS — H6121 Impacted cerumen, right ear: Secondary | ICD-10-CM | POA: Diagnosis not present

## 2016-10-13 DIAGNOSIS — J020 Streptococcal pharyngitis: Secondary | ICD-10-CM | POA: Diagnosis not present

## 2016-10-14 DIAGNOSIS — H6122 Impacted cerumen, left ear: Secondary | ICD-10-CM | POA: Diagnosis not present

## 2016-10-14 DIAGNOSIS — H9190 Unspecified hearing loss, unspecified ear: Secondary | ICD-10-CM | POA: Diagnosis not present

## 2016-10-27 ENCOUNTER — Other Ambulatory Visit: Payer: Self-pay

## 2016-10-27 MED ORDER — HYDRALAZINE HCL 50 MG PO TABS: 50.0000 mg | ORAL_TABLET | Freq: Two times a day (BID) | ORAL | 2 refills | Status: DC

## 2016-11-03 DIAGNOSIS — D0461 Carcinoma in situ of skin of right upper limb, including shoulder: Secondary | ICD-10-CM | POA: Diagnosis not present

## 2016-11-03 DIAGNOSIS — D0471 Carcinoma in situ of skin of right lower limb, including hip: Secondary | ICD-10-CM | POA: Diagnosis not present

## 2016-11-03 DIAGNOSIS — L570 Actinic keratosis: Secondary | ICD-10-CM | POA: Diagnosis not present

## 2016-11-03 DIAGNOSIS — D044 Carcinoma in situ of skin of scalp and neck: Secondary | ICD-10-CM | POA: Diagnosis not present

## 2016-11-03 DIAGNOSIS — D485 Neoplasm of uncertain behavior of skin: Secondary | ICD-10-CM | POA: Diagnosis not present

## 2016-11-03 DIAGNOSIS — D692 Other nonthrombocytopenic purpura: Secondary | ICD-10-CM | POA: Diagnosis not present

## 2016-11-03 DIAGNOSIS — D0439 Carcinoma in situ of skin of other parts of face: Secondary | ICD-10-CM | POA: Diagnosis not present

## 2016-11-03 DIAGNOSIS — Z85828 Personal history of other malignant neoplasm of skin: Secondary | ICD-10-CM | POA: Diagnosis not present

## 2016-11-03 DIAGNOSIS — N35013 Post-traumatic anterior urethral stricture: Secondary | ICD-10-CM | POA: Diagnosis not present

## 2016-11-11 ENCOUNTER — Ambulatory Visit (INDEPENDENT_AMBULATORY_CARE_PROVIDER_SITE_OTHER): Payer: Medicare Other | Admitting: Cardiology

## 2016-11-11 VITALS — BP 120/64 | HR 57 | Ht 73.0 in | Wt 217.6 lb

## 2016-11-11 DIAGNOSIS — E785 Hyperlipidemia, unspecified: Secondary | ICD-10-CM

## 2016-11-11 DIAGNOSIS — I714 Abdominal aortic aneurysm, without rupture, unspecified: Secondary | ICD-10-CM

## 2016-11-11 DIAGNOSIS — E1169 Type 2 diabetes mellitus with other specified complication: Secondary | ICD-10-CM

## 2016-11-11 DIAGNOSIS — I10 Essential (primary) hypertension: Secondary | ICD-10-CM | POA: Diagnosis not present

## 2016-11-11 DIAGNOSIS — Z79899 Other long term (current) drug therapy: Secondary | ICD-10-CM | POA: Diagnosis not present

## 2016-11-11 DIAGNOSIS — I2510 Atherosclerotic heart disease of native coronary artery without angina pectoris: Secondary | ICD-10-CM | POA: Diagnosis not present

## 2016-11-11 DIAGNOSIS — I209 Angina pectoris, unspecified: Secondary | ICD-10-CM | POA: Diagnosis not present

## 2016-11-11 DIAGNOSIS — Z9861 Coronary angioplasty status: Secondary | ICD-10-CM

## 2016-11-11 DIAGNOSIS — I25708 Atherosclerosis of coronary artery bypass graft(s), unspecified, with other forms of angina pectoris: Secondary | ICD-10-CM | POA: Diagnosis not present

## 2016-11-11 DIAGNOSIS — R600 Localized edema: Secondary | ICD-10-CM | POA: Diagnosis not present

## 2016-11-11 NOTE — Patient Instructions (Addendum)
   LABS IN 3 MONTHS  LIPID  CMP DO NOT EAT OR DRINK THE MORNING OF THE TEST ( THE DAY YOUR WIFE COMES FOR HER APPOINTMENT)    Your physician wants you to follow-up in 6 month with Dr Ellyn Hack. You will receive a reminder letter in the mail two months in advance. If you don't receive a letter, please call our office to schedule the follow-up appointment.

## 2016-11-11 NOTE — Progress Notes (Signed)
PCP: Cyndi Bender, PA-C  Clinic Note: Chief Complaint  Patient presents with  . Follow-up    6 months  . Coronary Artery Disease    Of both native and grafts. Recent PCI for STEMI    HPI: [name] is a 83 y.o. male with a PMH below who presents today for six-month follow-up of CAD-CABG with non-STEMI and then also recent STEMI having had PCI to the SVG-RCA. History of stroke, diabetes, hypertension and hyperlipidemia.  ROWDY GUERRINI was last seen on 06/09/2016  Recent Hospitalizations: None since last visit  Studies Personally Reviewed - (if available, images/films reviewed: From Epic Chart or Care Everywhere)  None since last visit  Interval History: Longino returns today doing quite well. He has no major issues today. I actually saw him earlier than originally scheduled. He is here with his wife as I went ahead and had him worked in today. Her main thing he notes is that overall his energy level has decreased. He doesn't do as well on the hot weather as he doesn't more weather. He gets more short of breath and he then usual, but it really doesn't make him stop doing what he wants to do it just takes a rest. He is feeling some intermittent twinges in his chest and abdomen nothing like his MI type pain.  He really denies any chest pain with rest or exertion.  No PND, orthopnea or edema.  No palpitations, lightheadedness, weakness or syncope/near syncope. No TIA/amaurosis fugax symptoms. No melena, hematochezia, hematuria, or epstaxis. No claudication.  ROS: A comprehensive was performed. Pertinent symptoms noted in history of present illness Review of Systems  Constitutional: Positive for malaise/fatigue (Overall less energy).  Respiratory: Negative for cough, sputum production, shortness of breath and wheezing.   Gastrointestinal: Positive for heartburn.  Musculoskeletal: Positive for joint pain. Negative for falls.  Neurological: Positive for dizziness (Sometimes  positional).       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  All other systems reviewed and are negative.  I have reviewed and (if needed) personally updated the patient's problem list, medications, allergies, past medical and surgical history, social and family history.   Past Medical History:  Diagnosis Date  . Abdominal aortic aneurysm ([place])    a. Aortic duplex 06/2014: mild aneurysmal dilatation of proximal abdominal aorta measuring 3.4x3.4cm. No sig change from 2012. F/u due 06/2016;   . AKI (acute kidney injury) ([place]) 03/28/2016  . Arthritis    "right knee; never bothered me" (03/26/2016)  . 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 CAD in native artery    a. NSTEMI 11/2010 - CABG x2(LIMA to LAD, SVG to PDA). b. NEG [place] MV 10/24/13, EF 53%, no perfusion abnormality, septal and apical HK noted. c. NSTEMI 05/2014 - s/p DES to SVG-RPDA 06/18/14 (Xience Alpine DES 3.0 x 18 mm -3.35 mm), EF 60-65; d. 03/2016 STEMI/PCI: LM nl, ost LAD 70%, mLCx 50%, pRCA 95% - mRCA 60%, dRPDA 70%, LIMA->LAD ok, o-p SVG->RPDA 100% (3.0x16 Promus DES overlapping prior stent).  . Diastolic dysfunction    a. 03/2016 Echo: EF 60-65%, no rwma, Gr1 DD, triv AI, Ao root 77mm, Asc Ao 80mm, triv MR.  . Dizziness    a. Carotid duplex 03/2014: mild fibrous plaque, no significant stenosis.  . Foley catheter in place    "been wearing it for a couple months now" (03/26/2016)  . HTN (hypertension)   .  Hyperlipidemia LDL goal <70   . Non-STEMI (non-ST elevated myocardial infarction) ([place]) 11/2010   1. Ostial LAD 70% (to close to [facility] for PCI), subtotal occlusion of the RCA  . Postoperative atrial fibrillation ([place]) 11/2010   Post CABG, no sign recurrence  . Refusal of blood transfusions as patient is Jehovah's Witness   . 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 Servando Snare)  . Sleep apnea    Not on CPAP. (03/26/2016)  . ST  elevation myocardial infarction (STEMI) of inferior wall ([place]) 04/05/2016   Occluded in-stent restenosis/thrombosis of SVG-RCA --> treated with overlapping Promus DES 3.0 mm 16 mm postdilated 3.0 mm).  . Stroke [facility]) 2014; 01/2015   a. 2014 with mild right hand weakness, nonhemorrhagic per pt.;; b. - PTA-Stent L ICA 95%   . Type II diabetes mellitus ([place])    Diagnostic Diagram    03/26/2016                             Post-Intervention Diagram - PCI SVG-rPDA          Promus DEs 3.0 x 16 (3.3)          Past Surgical History:  Procedure Laterality Date  . APPENDECTOMY    . CARDIAC CATHETERIZATION  12/11/2010   Dr. Chase Picket - subsequent cath - normal LV systolic function, no renal artery stenosis, severe 2-vessel disease with subtotaled RCA prox and distal 60% lesion and complex 70% area of narrowing of ostium of LAD  . CARDIAC CATHETERIZATION N/A 03/26/2016   Procedure: Left Heart Cath and Coronary Angiography;  Surgeon: Nelva Bush, MD;  Location: [place] [facility];  Service: Cardiovascular;  Laterality: N/A;  . CARDIAC CATHETERIZATION N/A 03/26/2016   Procedure: Coronary Stent Intervention;  Surgeon: Nelva Bush, MD;  Location: [place] [facility];  Service: Cardiovascular: 100% In-stent thrombosis of pros SVG-RCA (Xience DES) --> treated with PromusDES 3.0 x 18 (3.3 mm)  . CARDIAC CATHETERIZATION N/A 03/26/2016   Procedure: Bypass Graft Angiography;  Surgeon: Nelva Bush, MD;  Location: [place] [facility];  Service: Cardiovascular;  Laterality: N/A;  . CORONARY ANGIOPLASTY WITH STENT PLACEMENT  06/18/2014   PCI to SVG-RPDA 06/18/14 (Xience Alpine DES 3.0 x 18 mm -3.35 mm),   . CORONARY ARTERY BYPASS GRAFT  12/15/2010   X2, Dr Servando Snare; LIMA to LAD, SVG to PDA;   . CYSTOSCOPY WITH URETHRAL DILATATION    . IR GENERIC HISTORICAL  01/21/2016   IR RADIOLOGIST EVAL & MGMT 01/21/2016 [facility]  . IR GENERIC HISTORICAL  02/03/2016   IR CATHETER TUBE CHANGE 02/03/2016 Marybelle Killings, MD  [facility]INTER[facility]  . LEFT HEART CATHETERIZATION WITH CORONARY ANGIOGRAM N/A 06/18/2014   Procedure: LEFT HEART CATHETERIZATION WITH CORONARY ANGIOGRAM;  Surgeon: Leonie Man, MD;  Location: [facility] CATH LAB;  Service: Cardiovascular;  -- severe disease of SVG-rPDA  . RADIOLOGY WITH ANESTHESIA N/A 01/24/2015   Procedure: STENT ASSISTED ANGIOPLASTY (RADIOLOGY WITH ANESTHESIA);  Surgeon: Luanne Bras, MD;  Location: [place];  Service: Radiology;  Laterality: N/A;  . TONSILLECTOMY    . TRANSTHORACIC ECHOCARDIOGRAM  07/28/2012; 03/25/2015:   a. EF 55-60%, severe Conc LVH, Nl Systolic fxn, G1 DD,  Ao Sclerosis w/o stenosis; CRO PFO;; b. Moderate concentric LVH. EF 60-65%. No RWMA, Gr 1 DD. Mild LA dilation  . TRANSTHORACIC ECHOCARDIOGRAM  03/2016   Hypokinesis of the distal inferior wall. On her concentric hypertrophy. EF  60-65%.  GR 1 DD. Ascending aorta/root 45-46 mm.   Diagnostic Diagram  Nov 2017                                                            Successful PCI to proximal SVG to PDA in-stent restenosis/thrombosis with placement of a Promus Premier 3.0 x 16 mm drug eluting stent (dilated to 3.3 mm) with 0% residual stenosis and TIMI-3 flow.           Current Meds  Medication Sig  . amLODipine (NORVASC) 10 MG tablet Take 10 mg by mouth every evening.   Marland Kitchen aspirin 81 MG chewable tablet Chew 1 tablet (81 mg total) by mouth daily.  . clopidogrel (PLAVIX) 75 MG tablet Take 1 tablet (75 mg total) by mouth daily.  Marland Kitchen glimepiride (AMARYL) 4 MG tablet Take 4 mg by mouth 2 (two) times daily with a meal.   . hydrALAZINE (APRESOLINE) 50 MG tablet Take 1 tablet (50 mg total) by mouth 2 (two) times daily.  Marland Kitchen labetalol (NORMODYNE) 200 MG tablet Take 100 mg by mouth 2 (two) times daily.   Marland Kitchen losartan-hydrochlorothiazide (HYZAAR) 100-25 MG per tablet Take 1 tablet by mouth every morning.   . metFORMIN (GLUCOPHAGE) 1000 MG tablet Take 1 tablet (1,000 mg total) by mouth 2 (two) times daily.  . nitroGLYCERIN  (NITROSTAT) 0.4 MG SL tablet Place 1 tablet (0.4 mg total) under the tongue every 5 (five) minutes as needed for chest pain (up to 3 doses).    Allergies  Allergen Reactions  . Atorvastatin     Myalgias  . Crestor [Rosuvastatin]   . Other Other (See Comments)    No  BLOOD PRODUCTS - Pt is Jeohovah's Witness  . Pravastatin     Myalgias  . Simvastatin     Myalgias     Social History   Social History  . Marital status: Married    Spouse name: [name]  . Number of children: 1  . Years of education: N/A   Occupational History  .  Retired   Social History Main Topics  . Smoking status: Former Smoker    Packs/day: 0.33    Years: 10.00    Types: Cigarettes    Quit date: 1961  . Smokeless tobacco: Never Used  . Alcohol use No  . Drug use: No  . Sexual activity: No   Other Topics Concern  . None   Social History Narrative   Married father of one.  Previously uses stationary bike routinely.  Now reduced due to other social stressors.   Quit smoking 50 years ago.  Does not drink alcohol    family history includes Heart Problems (age of onset: 53) in his father.  Wt Readings from Last 3 Encounters:  11/11/16 217 lb 9.6 oz (98.7 kg)  06/24/16 219 lb (99.3 kg)  06/09/16 219 lb (99.3 kg)    PHYSICAL EXAM BP 120/64   Pulse (!) 57   Ht 6\' 1"  (1.854 m)   Wt 217 lb 9.6 oz (98.7 kg)   BMI 28.71 kg/m  General appearance: alert, cooperative, appears stated age, no distress. *\\\Well-nourished, well-groomed HEENT: [place]/AT, EOMI, MMM, anicteric sclera Neck: no adenopathy, no carotid bruit and no JVD Lungs: clear to auscultation bilaterally, normal percussion bilaterally and non-labored Heart: regular  rate and rhythm, S1 &S2 normal, no click, rub or gallop; nondisplaced PMI. 1/6 SEM at RUSB--> carotids Abdomen: soft, non-tender; bowel sounds normal; no masses,  no organomegaly; no HJR Extremities: extremities normal, atraumatic, no cyanosis, and edema - trivial ; R hand -  contracture/plegia from old CVA. Pulses: 2+ and symmetric;  Skin: mobility and turgor normal Neurologic: Mental status: Alert & oriented x 3, thought content appropriate; non-focal exam.  Pleasant mood & affect.    Adult ECG Report  Rate: 57 ;  Rhythm: sinus bradycardia and 1 AVB. Inferior MI, age undetermined. Cannot exclude anterior MI, age undetermined.;   Narrative Interpretation: Relatively stable EKG   Other studies Reviewed: Additional studies/ records that were reviewed today include:  Recent Labs:  n/a  Lab Results  Component Value Date   CHOL 152 03/26/2016   HDL 47 03/26/2016   LDLCALC 89 03/26/2016   TRIG 80 03/26/2016   CHOLHDL 3.2 03/26/2016    ASSESSMENT / PLAN: Problem List Items Addressed This Visit    Abdominal aortic aneurysm (HCC) (Chronic)    He was supposed of had his abdominal aorta reevaluated last year. We can check an abdominal aortic duplex when I see him back in follow-up.  For now continue aggressive risk factor modification.      Bilateral lower extremity edema (Chronic)    Edema sees relatively well-controlled. He still has some venous stasis changes, but is not really requiring any more diuretic and HCTZ. again continue to consider support stockings if edema gets worse.      CAD S/P percutaneous coronary angioplasty - Primary (Chronic)    Status post PCI to occluded SVG-RCA with in-stent thrombosis/restenosis. Now he is back on aspirin and Plavix as well as beta blocker and amlodipine. Currently not on statin.      Relevant Orders   EKG 12-Lead (Completed)   Coronary artery disease involving coronary bypass graft of native heart with other forms of angina pectoris (HCC) (Chronic)    Restenosis of stented SVG-RCA. A well now without any recurrent symptoms. I think we'll wait for at least another year to reassess with a Myoview. However if his symptoms were to progress we would do it earlier.      Hyperlipidemia associated with type 2  diabetes mellitus (HCC) (Chronic)    Aren't of different statins. His labs and been doing relatively well. We have been holding off on statin, but need to recheck lipids soon. Consider referral for PCSK9 inhibitor if necessary.      Hyperlipidemia LDL goal <70   Relevant Orders   Lipid panel   Comprehensive metabolic panel   Moderate essential hypertension (Chronic)    Blood pressure was actually pretty good today. He seems to be doing better on labetalol we have tried to convert him to carvedilol, but this is always been an issue. For now we'll just continue with labetalol and ARB/HCTZ along with hydralazine. Amlodipine is for blood pressure control as well as at anginal effect.      Relevant Orders   EKG 12-Lead (Completed)    Other Visit Diagnoses    Medication management       Relevant Orders   Comprehensive metabolic panel      Current medicines are reviewed at length with the patient today. (+/- concerns) n/a The following changes have been made: n/a  Patient Instructions    LABS IN 3 MONTHS  LIPID  CMP DO NOT EAT OR DRINK THE MORNING OF THE TEST ( THE DAY  YOUR WIFE COMES FOR HER APPOINTMENT)    Your physician wants you to follow-up in 6 month with Dr Ellyn Hack. You will receive a reminder letter in the mail two months in advance. If you don't receive a letter, please call our office to schedule the follow-up appointment.    Studies Ordered:   Orders Placed This Encounter  Procedures  . Lipid panel  . Comprehensive metabolic panel  . EKG 12-Lead      Glenetta Hew, M.D., M.S. Interventional Cardiologist   Pager # [phone] Phone # [phone] [address] [place], [place] [address]

## 2016-11-13 ENCOUNTER — Encounter: Payer: Self-pay | Admitting: Cardiology

## 2016-11-13 NOTE — Assessment & Plan Note (Signed)
Aren't of different statins. His labs and been doing relatively well. We have been holding off on statin, but need to recheck lipids soon. Consider referral for PCSK9 inhibitor if necessary.

## 2016-11-13 NOTE — Assessment & Plan Note (Addendum)
Blood pressure was actually pretty good today. He seems to be doing better on labetalol we have tried to convert him to carvedilol, but this is always been an issue. For now we'll just continue with labetalol and ARB/HCTZ along with hydralazine. Amlodipine is for blood pressure control as well as at anginal effect.

## 2016-11-13 NOTE — Assessment & Plan Note (Addendum)
He was supposed of had his abdominal aorta reevaluated last year. We can check an abdominal aortic duplex when I see him back in follow-up.  For now continue aggressive risk factor modification.

## 2016-11-13 NOTE — Assessment & Plan Note (Signed)
Edema sees relatively well-controlled. He still has some venous stasis changes, but is not really requiring any more diuretic and HCTZ. again continue to consider support stockings if edema gets worse.

## 2016-11-13 NOTE — Assessment & Plan Note (Signed)
Status post PCI to occluded SVG-RCA with in-stent thrombosis/restenosis. Now he is back on aspirin and Plavix as well as beta blocker and amlodipine. Currently not on statin.

## 2016-11-13 NOTE — Assessment & Plan Note (Signed)
Restenosis of stented SVG-RCA. A well now without any recurrent symptoms. I think we'll wait for at least another year to reassess with a Myoview. However if his symptoms were to progress we would do it earlier.

## 2016-11-16 ENCOUNTER — Ambulatory Visit: Payer: Medicare Other | Admitting: Cardiology

## 2016-12-14 DIAGNOSIS — R3914 Feeling of incomplete bladder emptying: Secondary | ICD-10-CM | POA: Diagnosis not present

## 2016-12-15 ENCOUNTER — Telehealth (HOSPITAL_COMMUNITY): Payer: Self-pay

## 2016-12-15 NOTE — Telephone Encounter (Signed)
Called to schedule 6 month f/u mri, no answer, no vm. AW

## 2016-12-22 ENCOUNTER — Encounter: Payer: Self-pay | Admitting: Neurology

## 2016-12-22 ENCOUNTER — Ambulatory Visit (INDEPENDENT_AMBULATORY_CARE_PROVIDER_SITE_OTHER): Payer: Medicare Other | Admitting: Neurology

## 2016-12-22 VITALS — BP 133/74 | HR 56 | Wt 218.0 lb

## 2016-12-22 DIAGNOSIS — G451 Carotid artery syndrome (hemispheric): Secondary | ICD-10-CM

## 2016-12-22 DIAGNOSIS — I10 Essential (primary) hypertension: Secondary | ICD-10-CM | POA: Diagnosis not present

## 2016-12-22 DIAGNOSIS — I6522 Occlusion and stenosis of left carotid artery: Secondary | ICD-10-CM

## 2016-12-22 DIAGNOSIS — E118 Type 2 diabetes mellitus with unspecified complications: Secondary | ICD-10-CM | POA: Diagnosis not present

## 2016-12-22 DIAGNOSIS — I63512 Cerebral infarction due to unspecified occlusion or stenosis of left middle cerebral artery: Secondary | ICD-10-CM

## 2016-12-22 DIAGNOSIS — E785 Hyperlipidemia, unspecified: Secondary | ICD-10-CM

## 2016-12-22 HISTORY — DX: Carotid artery syndrome (hemispheric): G45.1

## 2016-12-22 NOTE — Progress Notes (Signed)
STROKE NEUROLOGY FOLLOW UP NOTE  NAME: [name] DOB: 22-Jul-1930  REASON FOR VISIT: stroke follow up HISTORY FROM: pt and chart  Today we had the pleasure of seeing VITTORIO MOHS in follow-up at our [facility]. Pt was accompanied by no one.   History Summary Mr. VIRLAN KEMPKER is a 83 y.o. male left handed with history of CAD, HTN, DM, HLD was admitted on 01/22/15 for 2 day history of right sided weakness. 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 CTA head and neck showed no thrombosis distal to left petrous ICA stenosis. Pt had no hypotension episodes but hypertension in 180s. Pt had left MCA stroke in 2013 and had dizziness episode in 2015. Combination of the fact of recurrent left MCA stroke and chronic HTN running in 180s, he underwent cerebral angio with angioplasty, no stent required. Repeat MRA post procedure showed stenosis improved to 50%. A1C 7.8 and LDL 61. He was discharged with dual antiplatelet and crestor.   03/21/15 follow up - the patient has been doing well. No recurrent stroke like symptoms. His BP today 155/78. His glucose at home about 100-140. He still has right hand and wrist weakness and he is continued on PT/OT. He was taking crestor 5mg  twice a week but was later discontinued by PCP. Otherwise he is doing well without complains. He has appointment with Dr. Estanislado Pandy to repeat angio.  06/24/15 follow-up - pt has been doing well, no stroke like symptom. He had follow up with Dr. Estanislado Pandy in 05/2014, and had repeat MRA showed stable 50% residue stenosis of the left petrous ICA, and stable 50% left supraclinoid ICA stenosis. P2Y12 has not been checked yet. Right wrist extension and flexion much improved, still has difficulty of right hand. He complains of right hand pain intermittently, relieved by hand brace. BP 133/72.  12/23/2015 follow up - patient has been doing well, no stroke like  symptoms. He has appointment with Dr. Estanislado Pandy this week. Still has right hand weakness and spasticity, no pain, currently doing home exercises. He had urinary retention, went to ER, was put on 40 catheter, is going to follow-up with urology as outpatient on 12/31/2015. BP 150/82.  06/24/16 follow up - pt has been doing well. No stroke like symptoms. Right hand still weak, but he is left handed. Had MRI and MRA 12/2015 showed stable 50% stenosis of left petrous ICA s/p angioplasty. Unchanged intracranial athero. Followed up with Dr. Estanislado Pandy in 01/2016 recommend close monitoring. Glucose still fluctuate at home, last A1C 7.1 and LDL 89. Follows up with cardiology for injectable medications. He had admission on 03/26/16 for CP and found to have STEMI s/p PCI. Today BP 148/74.  Interval History During the interval time, pt had MRI C-spine showed C5-6 disc herniation. He had cervical injection for neck pain and off plavix for 5 days. On 09/05/16 he had episode of slurry speech and right facial droop. 911 called and he was sent to [facility] ER. On arrival, symptoms resolved. MRI no stroke and CUS no carotid stenosis. He was discharged home with DAPT. He is not on statin due to intolerance. Glucose controlled well and A1C 7.0. Followed with Deveshwar, had MRI and MRA on 07/28/16 no stroke and no change of ICA stenosis. BP today 133/74.   REVIEW OF SYSTEMS: Full 14 system review of systems performed and notable only for those listed below and in HPI above, all  others are negative:  Constitutional:   Cardiovascular:  Ear/Nose/Throat:   Skin:  Eyes:   Respiratory:   Gastroitestinal:   Genitourinary:  Hematology/Lymphatic:   Endocrine:  Musculoskeletal:   Allergy/Immunology:   Neurological:   Psychiatric:  Sleep:   The following represents the patient's updated allergies and side effects list: Allergies  Allergen Reactions  . Atorvastatin     Myalgias  . Crestor [Rosuvastatin]   . Other Other  (See Comments)    No  BLOOD PRODUCTS - Pt is [employer]  . Pravastatin     Myalgias  . Simvastatin     Myalgias     The neurologically relevant items on the patient's problem list were reviewed on today's visit.  Neurologic Examination  A problem focused neurological exam (12 or more points of the single system neurologic examination, vital signs counts as 1 point, cranial nerves count for 8 points) was performed.  Blood pressure 133/74, pulse (!) 56, weight 218 lb (98.9 kg).  General - Well nourished, well developed, in no apparent distress.  Ophthalmologic - Fundi not visualized due to eye movement.  Cardiovascular - Regular rate and rhythm.  Mental Status -  Level of arousal and orientation to time, place, and person were intact. Language including expression, naming, repetition, comprehension was assessed and found intact. Fund of Knowledge was assessed and was intact.  Cranial Nerves II - XII - II - Visual field intact OU. III, IV, VI - Extraocular movements intact. V - Facial sensation intact bilaterally. VII - Facial movement intact bilaterally. VIII - Hearing & vestibular intact bilaterally. X - Palate elevates symmetrically. XI - Chin turning & shoulder shrug intact bilaterally. XII - Tongue protrusion intact.  Motor Strength - The patient's strength was normal in all extremities except right wrist 5-/5 extension and 4/5  flexion, right hand 3-/5 grip, and pronator drift was absent.  Bulk was normal and fasciculations were absent.   Motor Tone - Muscle tone was assessed at the neck and appendages and was normal.  Reflexes - The patient's reflexes were 1+ in all extremities and he had no pathological reflexes.  Sensory - Light touch, temperature/pinprick were assessed and were decreased at right hand, 50% of left hand.    Coordination - The patient had normal movements in the hands and feet with no ataxia or dysmetria.  Tremor was absent.  Gait and  Station - slow cautious broad-based gait but steady.  Data reviewed: I personally reviewed the images and agree with the radiology interpretations.  Dg Chest 2 View 01/22/2015 No active cardiopulmonary disease.   Ct Head Wo Contrast 01/22/2015 1. No acute intracranial pathology. 2. Chronic microvascular disease and cerebral atrophy.   Ct Angio Head & Neck W/cm &/or Wo/cm 01/22/2015 High-grade stenosis of the skullbase LEFT ICA at the junction of the horizontal petrous and inferior cavernous segments estimated 90%. No intraluminal filling defect to suggest thrombus. The MR finding of a possible filling defect in the LEFT ICA relates to turbulent flow. Chronically occluded RIGHT vertebral artery. Greater than 50% stenosis proximal LEFT ICA, not clearly flow reducing. This tandem lesion does further compromise flow to the LEFT anterior circulation.   Mri & Mra Head Wo Contrast 01/22/2015 Punctate foci of acute infarctions scattered throughout the anterior circulation distribution on the left consistent with micro embolic infarctions. Some of these could be watershed. Chronic small vessel ischemic changes elsewhere throughout the brain. Old left parietal cortical infarction. Severe focal stenosis of the left petrous carotid, 90%  or greater. Possible filling defect distal to that which could be intraluminal thrombus or signal loss from severe turbulence. Stenoses in the anterior circulation intracranial branches, left worse than right. On the left, these could be atherosclerotic stenoses or conceivably due to nonocclusive embolic disease. Chronically occluded right vertebral.   05/30/15 1. No acute intracranial abnormality. 2. Chronic infarcts and small vessel ischemic disease as above. 3. Unchanged head MRA. Less than 50% residual stenosis of the left petrous ICA. 4. 50% left supraclinoid ICA stenosis. 5. Mild distal right M1 and moderate to severe left MCA bifurcation stenoses. 6.  Moderate left A1 ACA stenosis and chronic left A2 occlusion. 7. Mild mid basilar artery stenosis. 8. Moderate to severe bilateral PCA branch vessel stenoses.  2D echo - - Left ventricle: The cavity size was normal. There was moderate concentric hypertrophy. Systolic function was normal. The estimated ejection fraction was in the range of 60% to 65%. Wall motion was normal; there were no regional wall motion abnormalities. Doppler parameters are consistent with abnormal left ventricular relaxation (grade 1 diastolic dysfunction). - Aortic valve: Trileaflet; normal thickness leaflets. - Aortic root: The aortic root was normal in size. - Left atrium: The atrium was mildly dilated. - Right ventricle: Systolic function was normal. - Right atrium: The atrium was normal in size. - Tricuspid valve: There was mild regurgitation. - Pulmonic valve: There was no regurgitation. - Pulmonary arteries: Systolic pressure was within the normal range. - Inferior vena cava: The vessel was normal in size. - Pericardium, extracardiac: There was no pericardial effusion.  Cerebral angio - S/P LT ICA intracranial angioplasty for severe symptomatic stenosis.with <20 % stenosis post angioplasty.  MRI and MRA head  12/26/15 1. No acute intracranial abnormality. 2. Stable chronic ischemic changes as above. 3. New moderate proximal left P2 stenosis. 4. Unchanged intracranial atherosclerosis elsewhere including severe left MCA bifurcation and left A2 stenoses. 5. Unchanged less than 50% residual narrowing of the left petrous ICA at site of prior angioplasty.  07/28/16 1. Unchanged appearance of the treated left internal carotid artery with less than 50 percent stenosis of the petrous segment. Unchanged moderate narrowing of the left ICA clinoid segment. 2. Unchanged severe left MCA bifurcation stenosis and mild-to-moderate stenosis of the left ACA A2 and PCA P2 segments. 3. Multiple old  infarcts and findings of chronic microvascular ischemia without acute intracranial abnormality.   Component     Latest Ref Rng 06/27/2014 01/22/2015 01/23/2015 01/24/2015  Cholesterol     0 - 200 mg/dL  110    Triglycerides     <150 mg/dL  24    HDL Cholesterol     >40 mg/dL  44    Total CHOL/HDL Ratio       2.5    VLDL     0 - 40 mg/dL  5    LDL (calc)     0 - 99 mg/dL  61    Hemoglobin A1C     4.8 - 5.6 %  7.8 (H)    Mean Plasma Glucose       177    TSH     0.350 - 4.500 uIU/mL 2.651     HIV     Non Reactive   Non Reactive   Platelet Function  P2Y12     194 - 418 PRU    148 (L)   Component     Latest Ref Rng & Units 03/26/2016  Cholesterol     0 - 200 mg/dL 152  Triglycerides     <150 mg/dL 80  HDL Cholesterol     >40 mg/dL 47  Total CHOL/HDL Ratio     RATIO 3.2  VLDL     0 - 40 mg/dL 16  LDL (calc)     0 - 99 mg/dL 89  Hemoglobin A1C     4.8 - 5.6 % 7.1 (H)  Mean Plasma Glucose     mg/dL 157    Assessment: As you may recall, he is a 83 y.o. 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 CTA head and neck showed no thrombosis distal to left petrous ICA stenosis. Pt had left MCA stroke in 2013 and had dizziness episode in 2015. Combination of the fact of recurrent left MCA stroke and chronic HTN running in 180s, he underwent cerebral angio with angioplasty, no stent required. Repeat MRA post procedure showed stenosis improved to 50%. A1C 7.8 and LDL 61. He was discharged with dual antiplatelet and crestor. During the interval time, the patient has been doing well. No recurrent stroke like symptoms. He was taking crestor 5mg  twice a week but was later discontinued by PCP due to intolarence. He followed up with Dr. Estanislado Pandy and repeat MRA in 05/2015, 12/2015 and 07/2016 showed stable left ICA petrous 50% and supraclinoid 50% stenosis  as well as left M1/M2 high grade stenosis. 03/2016 A1C 7.1 and LDL 89, following with cardiology for HLD management. Had STEMI in 03/2016 s/p PCI. 08/2016 episode of transient dysarthria and right facial droop while off plavix for cervical injection, MRI and CUS negative. Likely related to left MCA high grade stenosis. Continued on DAPT. Recent A1C 7.0. BP stable.   Plan:  - Continue ASA and plavix for stroke prevention - follow up with cardiology for HLD treatment and consider PCSK9 inhibitors if needed.  - check BP at home, goal 120-150 due to ICA stenosis - check glucose at home - Follow up with primary care physician for stroke risk factor modification. Recommend maintain blood pressure goal 120-150, diabetes with hemoglobin A1c goal below 6.5% and lipids with LDL cholesterol goal below 70 mg/dL.  - continue to follow up with Dr. Estanislado Pandy  - exercise with right hand at home. - follow up in one year.  I spent more than 25 minutes of face to face time with the patient. Greater than 50% of time was spent in counseling and coordination of care. We reviewed MRI and MRA head, discussed about TIA, BP and glucose control, and right hand home exercises.   No orders of the defined types were placed in this encounter.   Meds ordered this encounter  Medications  . oxybutynin (DITROPAN) 5 MG tablet  . cyanocobalamin 500 MCG tablet    Sig: Take 500 mcg by mouth daily.    Patient Instructions  - Continue ASA and plavix for stroke prevention - follow up with cardiology for HLD treatment and consider PCSK9 inhibitors if needed.  - check BP at home, goal 120-150 due to ICA stenosis - check glucose at home - Follow up with primary care physician for stroke risk factor modification. Recommend maintain blood pressure goal 120-150, diabetes with hemoglobin A1c goal below 6.5% and lipids with LDL cholesterol goal below 70 mg/dL.  - continue to follow up with Dr. Estanislado Pandy  - exercise with right hand at  home. - follow up  in one year.   Rosalin Hawking, MD PhD [facility] [address] [place],  [address] [phone]

## 2016-12-22 NOTE — Patient Instructions (Signed)
-   Continue ASA and plavix for stroke prevention - follow up with cardiology for HLD treatment and consider PCSK9 inhibitors if needed.  - check BP at home, goal 120-150 due to ICA stenosis - check glucose at home - Follow up with primary care physician for stroke risk factor modification. Recommend maintain blood pressure goal 120-150, diabetes with hemoglobin A1c goal below 6.5% and lipids with LDL cholesterol goal below 70 mg/dL.  - continue to follow up with Dr. Estanislado Pandy  - exercise with right hand at home. - follow up in one year.

## 2017-01-11 DIAGNOSIS — E785 Hyperlipidemia, unspecified: Secondary | ICD-10-CM | POA: Diagnosis not present

## 2017-01-11 DIAGNOSIS — Z79899 Other long term (current) drug therapy: Secondary | ICD-10-CM | POA: Diagnosis not present

## 2017-01-11 LAB — COMPREHENSIVE METABOLIC PANEL
A/G RATIO: 1.9 (ref 1.2–2.2)
ALT: 7 IU/L (ref 0–44)
AST: 11 IU/L (ref 0–40)
Albumin: 4 g/dL (ref 3.5–4.7)
Alkaline Phosphatase: 78 IU/L (ref 39–117)
BUN/Creatinine Ratio: 23 (ref 10–24)
BUN: 31 mg/dL — AB (ref 8–27)
Bilirubin Total: 0.3 mg/dL (ref 0.0–1.2)
CALCIUM: 9.1 mg/dL (ref 8.6–10.2)
CO2: 21 mmol/L (ref 20–29)
CREATININE: 1.33 mg/dL — AB (ref 0.76–1.27)
Chloride: 105 mmol/L (ref 96–106)
GFR calc Af Amer: 57 mL/min/{1.73_m2} — ABNORMAL LOW (ref >59)
GFR, EST NON AFRICAN AMERICAN: 49 mL/min/{1.73_m2} — AB (ref >59)
Globulin, Total: 2.1 g/dL (ref 1.5–4.5)
Glucose: 117 mg/dL — ABNORMAL HIGH (ref 65–99)
POTASSIUM: 5 mmol/L (ref 3.5–5.2)
Sodium: 144 mmol/L (ref 134–144)
TOTAL PROTEIN: 6.1 g/dL (ref 6.0–8.5)

## 2017-01-11 LAB — LIPID PANEL
CHOL/HDL RATIO: 3.4 ratio (ref 0.0–5.0)
Cholesterol, Total: 134 mg/dL (ref 100–199)
HDL: 40 mg/dL (ref >39)
LDL Calculated: 77 mg/dL (ref 0–99)
TRIGLYCERIDES: 83 mg/dL (ref 0–149)
VLDL Cholesterol Cal: 17 mg/dL (ref 5–40)

## 2017-01-12 DIAGNOSIS — R338 Other retention of urine: Secondary | ICD-10-CM | POA: Diagnosis not present

## 2017-01-20 ENCOUNTER — Other Ambulatory Visit (HOSPITAL_COMMUNITY): Payer: Self-pay | Admitting: Interventional Radiology

## 2017-01-20 DIAGNOSIS — I771 Stricture of artery: Secondary | ICD-10-CM

## 2017-01-20 DIAGNOSIS — I639 Cerebral infarction, unspecified: Secondary | ICD-10-CM

## 2017-02-04 ENCOUNTER — Ambulatory Visit (HOSPITAL_COMMUNITY)
Admission: RE | Admit: 2017-02-04 | Discharge: 2017-02-04 | Disposition: A | Discharge status: Home / Self Care | Payer: Medicare Other | Source: Ambulatory Visit | Attending: Interventional Radiology | Admitting: Interventional Radiology

## 2017-02-04 ENCOUNTER — Encounter (HOSPITAL_COMMUNITY): Payer: Self-pay

## 2017-02-04 ENCOUNTER — Ambulatory Visit (HOSPITAL_COMMUNITY): Admission: RE | Admit: 2017-02-04 | Payer: Medicare Other | Source: Ambulatory Visit

## 2017-02-04 DIAGNOSIS — I6523 Occlusion and stenosis of bilateral carotid arteries: Secondary | ICD-10-CM | POA: Diagnosis not present

## 2017-02-04 DIAGNOSIS — I651 Occlusion and stenosis of basilar artery: Secondary | ICD-10-CM | POA: Diagnosis not present

## 2017-02-04 DIAGNOSIS — Z9862 Peripheral vascular angioplasty status: Secondary | ICD-10-CM | POA: Insufficient documentation

## 2017-02-04 DIAGNOSIS — Z8673 Personal history of transient ischemic attack (TIA), and cerebral infarction without residual deficits: Secondary | ICD-10-CM | POA: Insufficient documentation

## 2017-02-04 DIAGNOSIS — I771 Stricture of artery: Secondary | ICD-10-CM

## 2017-02-04 DIAGNOSIS — I669 Occlusion and stenosis of unspecified cerebral artery: Secondary | ICD-10-CM | POA: Insufficient documentation

## 2017-02-04 DIAGNOSIS — I639 Cerebral infarction, unspecified: Secondary | ICD-10-CM

## 2017-02-04 MED ORDER — GADOBENATE DIMEGLUMINE 529 MG/ML IV SOLN
20.0000 mL | Freq: Once | INTRAVENOUS | Status: AC | PRN
  Administered 2017-02-04: 20 mL via INTRAVENOUS

## 2017-02-09 DIAGNOSIS — R338 Other retention of urine: Secondary | ICD-10-CM | POA: Diagnosis not present

## 2017-02-17 ENCOUNTER — Other Ambulatory Visit (HOSPITAL_COMMUNITY): Payer: Self-pay | Admitting: Interventional Radiology

## 2017-02-17 DIAGNOSIS — I771 Stricture of artery: Secondary | ICD-10-CM

## 2017-02-17 DIAGNOSIS — I639 Cerebral infarction, unspecified: Secondary | ICD-10-CM

## 2017-02-22 ENCOUNTER — Other Ambulatory Visit: Payer: Self-pay

## 2017-02-22 MED ORDER — CLOPIDOGREL BISULFATE 75 MG PO TABS: 75.0000 mg | ORAL_TABLET | Freq: Every day | ORAL | 1 refills | Status: DC

## 2017-02-24 ENCOUNTER — Other Ambulatory Visit: Payer: Self-pay | Admitting: Radiology

## 2017-02-24 ENCOUNTER — Other Ambulatory Visit: Payer: Self-pay | Admitting: General Surgery

## 2017-02-24 DIAGNOSIS — R52 Pain, unspecified: Secondary | ICD-10-CM | POA: Diagnosis not present

## 2017-02-24 DIAGNOSIS — M1711 Unilateral primary osteoarthritis, right knee: Secondary | ICD-10-CM | POA: Diagnosis not present

## 2017-02-25 ENCOUNTER — Other Ambulatory Visit (HOSPITAL_COMMUNITY): Payer: Self-pay | Admitting: Interventional Radiology

## 2017-02-25 ENCOUNTER — Encounter (HOSPITAL_COMMUNITY): Payer: Self-pay

## 2017-02-25 ENCOUNTER — Ambulatory Visit (HOSPITAL_COMMUNITY)
Admission: RE | Admit: 2017-02-25 | Discharge: 2017-02-25 | Disposition: A | Discharge status: Home / Self Care | Payer: Medicare Other | Source: Ambulatory Visit | Attending: Interventional Radiology | Admitting: Interventional Radiology

## 2017-02-25 DIAGNOSIS — E785 Hyperlipidemia, unspecified: Secondary | ICD-10-CM | POA: Diagnosis not present

## 2017-02-25 DIAGNOSIS — Z87891 Personal history of nicotine dependence: Secondary | ICD-10-CM | POA: Diagnosis not present

## 2017-02-25 DIAGNOSIS — G4730 Sleep apnea, unspecified: Secondary | ICD-10-CM | POA: Insufficient documentation

## 2017-02-25 DIAGNOSIS — Z7982 Long term (current) use of aspirin: Secondary | ICD-10-CM | POA: Insufficient documentation

## 2017-02-25 DIAGNOSIS — I10 Essential (primary) hypertension: Secondary | ICD-10-CM | POA: Insufficient documentation

## 2017-02-25 DIAGNOSIS — I2510 Atherosclerotic heart disease of native coronary artery without angina pectoris: Secondary | ICD-10-CM | POA: Diagnosis not present

## 2017-02-25 DIAGNOSIS — I6603 Occlusion and stenosis of bilateral middle cerebral arteries: Secondary | ICD-10-CM | POA: Diagnosis not present

## 2017-02-25 DIAGNOSIS — Z7984 Long term (current) use of oral hypoglycemic drugs: Secondary | ICD-10-CM | POA: Insufficient documentation

## 2017-02-25 DIAGNOSIS — I252 Old myocardial infarction: Secondary | ICD-10-CM | POA: Diagnosis not present

## 2017-02-25 DIAGNOSIS — E119 Type 2 diabetes mellitus without complications: Secondary | ICD-10-CM | POA: Insufficient documentation

## 2017-02-25 DIAGNOSIS — Z955 Presence of coronary angioplasty implant and graft: Secondary | ICD-10-CM | POA: Diagnosis not present

## 2017-02-25 DIAGNOSIS — I639 Cerebral infarction, unspecified: Secondary | ICD-10-CM

## 2017-02-25 DIAGNOSIS — I771 Stricture of artery: Secondary | ICD-10-CM

## 2017-02-25 DIAGNOSIS — I69341 Monoplegia of lower limb following cerebral infarction affecting right dominant side: Secondary | ICD-10-CM | POA: Diagnosis not present

## 2017-02-25 DIAGNOSIS — Z951 Presence of aortocoronary bypass graft: Secondary | ICD-10-CM | POA: Insufficient documentation

## 2017-02-25 HISTORY — PX: IR ANGIO VERTEBRAL SEL SUBCLAVIAN INNOMINATE UNI L MOD SED: IMG5364

## 2017-02-25 HISTORY — PX: IR ANGIO INTRA EXTRACRAN SEL COM CAROTID INNOMINATE BILAT MOD SED: IMG5360

## 2017-02-25 LAB — BASIC METABOLIC PANEL
Anion gap: 9 (ref 5–15)
BUN: 31 mg/dL — AB (ref 6–20)
CO2: 24 mmol/L (ref 22–32)
Calcium: 9.5 mg/dL (ref 8.9–10.3)
Chloride: 105 mmol/L (ref 101–111)
Creatinine, Ser: 1.19 mg/dL (ref 0.61–1.24)
GFR calc Af Amer: >60 mL/min (ref >60)
GFR, EST NON AFRICAN AMERICAN: 54 mL/min — AB (ref >60)
GLUCOSE: 234 mg/dL — AB (ref 65–99)
Potassium: 4.2 mmol/L (ref 3.5–5.1)
Sodium: 138 mmol/L (ref 135–145)

## 2017-02-25 LAB — CBC
HEMATOCRIT: 32.7 % — AB (ref 39.0–52.0)
HEMOGLOBIN: 10.6 g/dL — AB (ref 13.0–17.0)
MCH: 29.1 pg (ref 26.0–34.0)
MCHC: 32.4 g/dL (ref 30.0–36.0)
MCV: 89.8 fL (ref 78.0–100.0)
Platelets: 372 10*3/uL (ref 150–400)
RBC: 3.64 MIL/uL — AB (ref 4.22–5.81)
RDW: 16 % — ABNORMAL HIGH (ref 11.5–15.5)
WBC: 6.2 10*3/uL (ref 4.0–10.5)

## 2017-02-25 LAB — PROTIME-INR
INR: 1.05
Prothrombin Time: 13.7 seconds (ref 11.4–15.2)

## 2017-02-25 LAB — APTT: APTT: 28 s (ref 24–36)

## 2017-02-25 LAB — GLUCOSE, CAPILLARY: GLUCOSE-CAPILLARY: 188 mg/dL — AB (ref 65–99)

## 2017-02-25 MED ORDER — ASPIRIN 81 MG PO CHEW
CHEWABLE_TABLET | ORAL | Status: AC
  Filled 2017-02-25: qty 1

## 2017-02-25 MED ORDER — MIDAZOLAM HCL 2 MG/2ML IJ SOLN
INTRAMUSCULAR | Status: AC
  Filled 2017-02-25: qty 2

## 2017-02-25 MED ORDER — FENTANYL CITRATE (PF) 100 MCG/2ML IJ SOLN
INTRAMUSCULAR | Status: AC
  Filled 2017-02-25: qty 2

## 2017-02-25 MED ORDER — HYDRALAZINE HCL 20 MG/ML IJ SOLN
INTRAMUSCULAR | Status: AC
  Filled 2017-02-25: qty 1

## 2017-02-25 MED ORDER — LIDOCAINE HCL 1 % IJ SOLN
INTRAMUSCULAR | Status: AC
  Filled 2017-02-25: qty 20

## 2017-02-25 MED ORDER — INSULIN ASPART 100 UNIT/ML ~~LOC~~ SOLN
0.0000 [IU] | Freq: Three times a day (TID) | SUBCUTANEOUS | Status: DC
  Administered 2017-02-25: 4 [IU] via SUBCUTANEOUS

## 2017-02-25 MED ORDER — CLOPIDOGREL BISULFATE 75 MG PO TABS
ORAL_TABLET | ORAL | Status: AC | PRN
  Administered 2017-02-25: 75 mg via ORAL

## 2017-02-25 MED ORDER — SODIUM CHLORIDE 0.9 % IV SOLN: Freq: Once | INTRAVENOUS | Status: DC

## 2017-02-25 MED ORDER — HEPARIN SODIUM (PORCINE) 1000 UNIT/ML IJ SOLN
INTRAMUSCULAR | Status: AC | PRN
  Administered 2017-02-25: 500 [IU] via INTRAVENOUS
  Administered 2017-02-25: 1000 [IU] via INTRAVENOUS

## 2017-02-25 MED ORDER — ASPIRIN 81 MG PO CHEW
CHEWABLE_TABLET | ORAL | Status: AC | PRN
  Administered 2017-02-25: 81 mg via ORAL

## 2017-02-25 MED ORDER — HEPARIN SODIUM (PORCINE) 1000 UNIT/ML IJ SOLN
INTRAMUSCULAR | Status: AC
  Filled 2017-02-25: qty 2

## 2017-02-25 MED ORDER — SODIUM CHLORIDE 0.9 % IV SOLN: INTRAVENOUS | Status: AC

## 2017-02-25 MED ORDER — IOPAMIDOL (ISOVUE-300) INJECTION 61%
INTRAVENOUS | Status: AC
  Administered 2017-02-25: 15 mL
  Filled 2017-02-25: qty 150

## 2017-02-25 MED ORDER — HYDRALAZINE HCL 20 MG/ML IJ SOLN
INTRAMUSCULAR | Status: AC | PRN
  Administered 2017-02-25 (×2): 5 mg via INTRAVENOUS

## 2017-02-25 MED ORDER — IOPAMIDOL (ISOVUE-300) INJECTION 61%
INTRAVENOUS | Status: AC
  Administered 2017-02-25: 70 mL
  Filled 2017-02-25: qty 50

## 2017-02-25 MED ORDER — LIDOCAINE HCL (PF) 1 % IJ SOLN
INTRAMUSCULAR | Status: AC | PRN
  Administered 2017-02-25: 10 mL

## 2017-02-25 MED ORDER — CLOPIDOGREL BISULFATE 75 MG PO TABS
ORAL_TABLET | ORAL | Status: AC
  Filled 2017-02-25: qty 1

## 2017-02-25 MED ORDER — FENTANYL CITRATE (PF) 100 MCG/2ML IJ SOLN
INTRAMUSCULAR | Status: AC | PRN
  Administered 2017-02-25: 25 ug via INTRAVENOUS

## 2017-02-25 MED ORDER — INSULIN ASPART 100 UNIT/ML ~~LOC~~ SOLN
SUBCUTANEOUS | Status: AC
  Filled 2017-02-25: qty 1

## 2017-02-25 MED ORDER — SODIUM CHLORIDE 0.9 % IV SOLN
INTRAVENOUS | Status: AC | PRN
  Administered 2017-02-25: 75 mL/h via INTRAVENOUS

## 2017-02-25 MED ORDER — MIDAZOLAM HCL 2 MG/2ML IJ SOLN
INTRAMUSCULAR | Status: AC | PRN
  Administered 2017-02-25: 1 mg via INTRAVENOUS

## 2017-02-25 NOTE — H&P (Signed)
Chief Complaint: History of Stroke  Referring Physician(s): Rosalin Hawking  Supervising Physician: Luanne Bras  Patient Status: [name] - Out-pt  History of Present Illness: [name] is a 83 y.o. male with remote history of CVA. He has had a previous cerebral angiography and he tells me "they ballooned an artery".  Recently he had MRI C-spine showed C5-6 disc herniation. He had cervical injection for neck pain and off plavix for 5 days.   On 09/05/16 he had episode of slurry speech and right facial droop. 911 called and he was sent to [name].   On arrival, symptoms resolved. MRI showed no stroke   He was discharged home with dual anti-platelet therapy.   He is not on statin due to intolerance.  He is here today for cerebral angiography.  He denies any recent stroke symptoms. He continues to take Plavix as directed.  Past Medical History:  Diagnosis Date  . Abdominal aortic aneurysm ([place])    a. Aortic duplex 06/2014: mild aneurysmal dilatation of proximal abdominal aorta measuring 3.4x3.4cm. No sig change from 2012. F/u due 06/2016;   . AKI (acute kidney injury) ([place]) 03/28/2016  . Arthritis    "right knee; never bothered me" (03/26/2016)  . 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 CAD in native artery    a. NSTEMI 11/2010 - CABG x2(LIMA to LAD, SVG to PDA). b. NEG Lexi MV 10/24/13, EF 53%, no perfusion abnormality, septal and apical HK noted. c. NSTEMI 05/2014 - s/p DES to SVG-RPDA 06/18/14 (Xience Alpine DES 3.0 x 18 mm -3.35 mm), EF 60-65; d. 03/2016 STEMI/PCI: LM nl, ost LAD 70%, mLCx 50%, pRCA 95% - mRCA 60%, dRPDA 70%, LIMA->LAD ok, o-p SVG->RPDA 100% (3.0x16 Promus DES overlapping prior stent).  . Diastolic dysfunction    a. 03/2016 Echo: EF 60-65%, no rwma, Gr1 DD, triv AI, Ao root 76mm, Asc Ao 49mm, triv MR.  . Dizziness    a. Carotid duplex 03/2014: mild fibrous plaque, no significant stenosis.  . Foley catheter  in place    "been wearing it for a couple months now" (03/26/2016)  . HTN (hypertension)   . Hyperlipidemia LDL goal <70   . Non-STEMI (non-ST elevated myocardial infarction) ([place]) 11/2010   1. Ostial LAD 70% (to close to [name] for PCI), subtotal occlusion of the RCA  . Postoperative atrial fibrillation ([place]) 11/2010   Post CABG, no sign recurrence  . Refusal of blood transfusions as patient is Jehov[employer]'s Witness   . 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 Servando Snare)  . Sleep apnea    Not on CPAP. (03/26/2016)  . ST elevation myocardial infarction (STEMI) of inferior wall ([place]) 04/05/2016   Occluded in-stent restenosis/thrombosis of SVG-RCA --> treated with overlapping Promus DES 3.0 mm 16 mm postdilated 3.0 mm).  . Stroke [facility]) 2014; 01/2015   a. 2014 with mild right hand weakness, nonhemorrhagic per pt.;; b. - PTA-Stent L ICA 95%   . Type II diabetes mellitus ([place])     Past Surgical History:  Procedure Laterality Date  . APPENDECTOMY    . CARDIAC CATHETERIZATION  12/11/2010   Dr. Chase Picket - subsequent cath - normal LV systolic function, no renal artery stenosis, severe 2-vessel disease with subtotaled RCA prox and distal 60% lesion and complex 70% area of narrowing of ostium of LAD  . CARDIAC CATHETERIZATION N/A 03/26/2016   Procedure: Left Heart Cath and Coronary Angiography;  Surgeon: Nelva Bush, MD;  Location: [place] [facility];  Service: Cardiovascular;  Laterality: N/A;  . CARDIAC CATHETERIZATION N/A 03/26/2016   Procedure: Coronary Stent Intervention;  Surgeon: Nelva Bush, MD;  Location: [place] [facility];  Service: Cardiovascular: 100% In-stent thrombosis of pros SVG-RCA (Xience DES) --> treated with PromusDES 3.0 x 18 (3.3 mm)  . CARDIAC CATHETERIZATION N/A 03/26/2016   Procedure: Bypass Graft Angiography;  Surgeon: Nelva Bush, MD;  Location: [place] [facility];  Service: Cardiovascular;  Laterality: N/A;  . CORONARY ANGIOPLASTY WITH STENT PLACEMENT   06/18/2014   PCI to SVG-RPDA 06/18/14 (Xience Alpine DES 3.0 x 18 mm -3.35 mm),   . CORONARY ARTERY BYPASS GRAFT  12/15/2010   X2, Dr Servando Snare; LIMA to LAD, SVG to PDA;   . CYSTOSCOPY WITH URETHRAL DILATATION    . IR GENERIC HISTORICAL  01/21/2016   IR RADIOLOGIST EVAL & MGMT 01/21/2016 [facility]INTER[facility]  . IR GENERIC HISTORICAL  02/03/2016   IR CATHETER TUBE CHANGE 02/03/2016 Marybelle Killings, MD [facility]-INTERV RAD  . LEFT HEART CATHETERIZATION WITH CORONARY ANGIOGRAM N/A 06/18/2014   Procedure: LEFT HEART CATHETERIZATION WITH CORONARY ANGIOGRAM;  Surgeon: Leonie Man, MD;  Location: [name];  Service: Cardiovascular;  -- severe disease of SVG-rPDA  . RADIOLOGY WITH ANESTHESIA N/A 01/24/2015   Procedure: STENT ASSISTED ANGIOPLASTY (RADIOLOGY WITH ANESTHESIA);  Surgeon: Luanne Bras, MD;  Location: [facility];  Service: Radiology;  Laterality: N/A;  . TONSILLECTOMY    . TRANSTHORACIC ECHOCARDIOGRAM  07/28/2012; 03/25/2015:   a. EF 55-60%, severe Conc LVH, Nl Systolic fxn, G1 DD,  Ao Sclerosis w/o stenosis; CRO PFO;; b. Moderate concentric LVH. EF 60-65%. No RWMA, Gr 1 DD. Mild LA dilation  . TRANSTHORACIC ECHOCARDIOGRAM  03/2016   Hypokinesis of the distal inferior wall. On her concentric hypertrophy. EF 60-65%.  GR 1 DD. Ascending aorta/root 45-46 mm.    Allergies: Atorvastatin; Crestor [rosuvastatin]; Other; Pravastatin; and Simvastatin  Medications: Prior to Admission medications   Medication Sig Start Date End Date Taking? Authorizing Provider  amLODipine (NORVASC) 10 MG tablet Take 10 mg by mouth every evening.    Yes [provider]  aspirin 81 MG chewable tablet Chew 1 tablet (81 mg total) by mouth daily. 03/29/16  Yes Isaiah Serge, NP  cyanocobalamin 500 MCG tablet Take 500 mcg by mouth daily.   Yes [provider]  glimepiride (AMARYL) 4 MG tablet Take 4 mg by mouth 2 (two) times daily with a meal.    Yes [provider]  hydrALAZINE (APRESOLINE) 50 MG tablet Take 1  tablet (50 mg total) by mouth 2 (two) times daily. 10/27/16  Yes Leonie Man, MD  labetalol (NORMODYNE) 200 MG tablet Take 100 mg by mouth 2 (two) times daily.    Yes [provider]  losartan-hydrochlorothiazide (HYZAAR) 100-25 MG per tablet Take 1 tablet by mouth every morning.    Yes [provider]  metFORMIN (GLUCOPHAGE) 1000 MG tablet Take 1 tablet (1,000 mg total) by mouth 2 (two) times daily. 03/29/16  Yes Isaiah Serge, NP  nitroGLYCERIN (NITROSTAT) 0.4 MG SL tablet Place 1 tablet (0.4 mg total) under the tongue every 5 (five) minutes as needed for chest pain (up to 3 doses). 06/19/14  Yes Dunn, Dayna N, PA-C  clopidogrel (PLAVIX) 75 MG tablet Take 1 tablet (75 mg total) by mouth daily. 02/22/17   Leonie Man, MD     Family History  Problem Relation Age of Onset  .  Heart Problems Father 64    Social History   Social History  . Marital status: Married    Spouse name: [name]  . Number of children: 1  . Years of education: N/A   Occupational History  .  Retired   Social History Main Topics  . Smoking status: Former Smoker    Packs/day: 0.33    Years: 10.00    Types: Cigarettes    Quit date: 1961  . Smokeless tobacco: Never Used  . Alcohol use No  . Drug use: No  . Sexual activity: No   Other Topics Concern  . None   Social History Narrative   Married father of one.  Previously uses stationary bike routinely.  Now reduced due to other social stressors.   Quit smoking 50 years ago.  Does not drink alcohol     Review of Systems: A 12 point ROS discussed Review of Systems  Constitutional: Negative.   HENT: Negative.   Respiratory: Negative.   Cardiovascular: Negative.   Gastrointestinal: Negative.   Musculoskeletal: Negative.   Skin: Negative.   Neurological: Negative.   Hematological: Negative.   Psychiatric/Behavioral: Negative.     Vital Signs: There were no vitals taken for this visit.  Physical Exam  Constitutional: He is  oriented to person, place, and time. He appears well-developed.  HENT:  Head: Normocephalic and atraumatic.  Eyes: EOM are normal.  Neck: Normal range of motion.  Cardiovascular: Normal rate, regular rhythm and normal heart sounds.   Pulmonary/Chest: Effort normal. No respiratory distress.  Abdominal: Soft.  Neurological: He is alert and oriented to person, place, and time.  Skin: Skin is warm and dry.  Vitals reviewed. Motor Strength - The patient's strength was normal in all extremities except right wrist 5-/5 extension and 4/5  flexion, right hand 3-/5 grip, and pronator drift was absent.  Bulk was normal and fasciculations were absent. Sensory - Light touch, temperature/pinprick were assessed and were decreased at right hand, 50% of left hand.  Gait and Station - slow cautious broad-based gait but steady.    Imaging: Mr Virgel Paling YJ Contrast  Result Date: 02/05/2017 CLINICAL DATA:  Recent intracranial angioplasty. Recent strokes. Follow-up. Recent admission for 2 day history of right-sided weakness beginning 01/21/2017 EXAM: MRI HEAD WITHOUT AND WITH CONTRAST MRA HEAD WITHOUT CONTRAST TECHNIQUE: Multiplanar, multiecho pulse sequences of the brain and surrounding structures were obtained without and with intravenous contrast. Angiographic images of the head were obtained using MRA technique without contrast. CONTRAST:  20 cc MultiHance COMPARISON:  09/05/2016.  07/28/2016.  12/26/2015. FINDINGS: MRI HEAD FINDINGS Brain: Diffusion imaging go does not show any acute or subacute infarction. There chronic small-vessel ischemic changes of the pons. No focal cerebellar insult. Cerebral hemispheres show old lacunar infarctions in the external capsule regions and mild chronic small-vessel ischemic changes of the deep and subcortical white matter. 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 No mass lesion, hemorrhage, hydrocephalus or extra-axial collection. After contrast  administration, no abnormal enhancement occurs. Vascular: Major vessels at the base of the brain show flow. Tiny right vertebral artery does not show demonstrable flow. Skull and upper cervical spine: Negative Sinuses/Orbits: Clear/normal Other: None MRA HEAD FINDINGS I cannot demonstrate any change when compared to the study of 12/26/2015. Right internal carotid artery is patent through the siphon region. There is moderate stenosis of the distal siphon. Supraclinoid ICA is widely patent. Right anterior and middle cerebral vessels are patent. Moderate narrowing and irregularity of the  distal M1 segment on the right is unchanged. Left internal carotid artery is patent through the skullbase. No evidence of recurrent stenosis in the petrous segment. Narrowing and irregularity in the carotid siphon region appears the same, with moderate severe stenosis of the distal siphon. Supraclinoid ICA is patent. Anterior cerebral artery is patent without focal stenosis. Severe stenosis of the left MCA bifurcation region appears the same. No antegrade flow in the right vertebral artery. Left vertebral artery is patent at the foramen magnum level and supplies left PICA. The vessel is patent to the basilar. Moderate irregular stenosis of the proximal basilar artery appears the same. Right PCA takes fetal origin from the anterior circulation. Moderate stenosis in the right PCA. Moderate serial stenoses in the left PCA. IMPRESSION: Stable MRI of the brain. Old small vessel ischemic changes. 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 No recent insult. Stable intracranial MR angiography. Widespread atherosclerotic disease as outlined above. Most notable findings include stenoses in both distal carotid siphon regions, both middle cerebral arteries and both posterior cerebral arteries, and in the proximal basilar artery. No new or progressive disease demonstrable. No evidence of recurrent stenosis in the left petrous ICA where  there had been previous angioplasty. Electronically Signed   By: Nelson Chimes M.D.   On: 02/05/2017 08:51   Mr Jeri Cos KW Contrast  Result Date: 02/05/2017 CLINICAL DATA:  Recent intracranial angioplasty. Recent strokes. Follow-up. Recent admission for 2 day history of right-sided weakness beginning 01/21/2017 EXAM: MRI HEAD WITHOUT AND WITH CONTRAST MRA HEAD WITHOUT CONTRAST TECHNIQUE: Multiplanar, multiecho pulse sequences of the brain and surrounding structures were obtained without and with intravenous contrast. Angiographic images of the head were obtained using MRA technique without contrast. CONTRAST:  20 cc MultiHance COMPARISON:  09/05/2016.  07/28/2016.  12/26/2015. FINDINGS: MRI HEAD FINDINGS Brain: Diffusion imaging go does not show any acute or subacute infarction. There chronic small-vessel ischemic changes of the pons. No focal cerebellar insult. Cerebral hemispheres show old lacunar infarctions in the external capsule regions and mild chronic small-vessel ischemic changes of the deep and subcortical white matter. 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 No mass lesion, hemorrhage, hydrocephalus or extra-axial collection. After contrast administration, no abnormal enhancement occurs. Vascular: Major vessels at the base of the brain show flow. Tiny right vertebral artery does not show demonstrable flow. Skull and upper cervical spine: Negative Sinuses/Orbits: Clear/normal Other: None MRA HEAD FINDINGS I cannot demonstrate any change when compared to the study of 12/26/2015. Right internal carotid artery is patent through the siphon region. There is moderate stenosis of the distal siphon. Supraclinoid ICA is widely patent. Right anterior and middle cerebral vessels are patent. Moderate narrowing and irregularity of the distal M1 segment on the right is unchanged. Left internal carotid artery is patent through the skullbase. No evidence of recurrent stenosis in the petrous  segment. Narrowing and irregularity in the carotid siphon region appears the same, with moderate severe stenosis of the distal siphon. Supraclinoid ICA is patent. Anterior cerebral artery is patent without focal stenosis. Severe stenosis of the left MCA bifurcation region appears the same. No antegrade flow in the right vertebral artery. Left vertebral artery is patent at the foramen magnum level and supplies left PICA. The vessel is patent to the basilar. Moderate irregular stenosis of the proximal basilar artery appears the same. Right PCA takes fetal origin from the anterior circulation. Moderate stenosis in the right PCA. Moderate serial stenoses in the left PCA. IMPRESSION: Stable  MRI of the brain. Old small vessel ischemic changes. 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 No recent insult. Stable intracranial MR angiography. Widespread atherosclerotic disease as outlined above. Most notable findings include stenoses in both distal carotid siphon regions, both middle cerebral arteries and both posterior cerebral arteries, and in the proximal basilar artery. No new or progressive disease demonstrable. No evidence of recurrent stenosis in the left petrous ICA where there had been previous angioplasty. Electronically Signed   By: Nelson Chimes M.D.   On: 02/05/2017 08:51    Labs:  CBC:  Recent Labs  03/26/16 0531 03/26/16 0533 03/26/16 1117 03/27/16 0230 03/28/16 1117  WBC 8.3  --  8.1 11.0* 8.2  HGB 12.0* 12.9* 12.1* 12.4* 12.5*  HCT 36.9* 38.0* 36.6* 37.0* 38.1*  PLT 423*  --  413* 421* 430*    COAGS:  Recent Labs  03/26/16 0504 03/26/16 0531  INR 1.00 1.09  APTT  --  33    BMP:  Recent Labs  03/27/16 0230 03/28/16 1117 04/02/16 1207 07/28/16 1228 01/11/17 1004  NA 136 135 140  --  144  K 3.6 4.1 5.1  --  5.0  CL 103 102 104  --  105  CO2 23 23 25   --  21  GLUCOSE 113* 255* 112*  --  117*  BUN 29* 30* 39*  --  31*  CALCIUM 9.2 9.0 9.8  --  9.1    CREATININE 1.46* 1.46* 1.35* 1.13 1.33*  GFRNONAA 43* 43*  --  58* 49*  GFRAA 49* 49*  --  >60 57*    LIVER FUNCTION TESTS:  Recent Labs  03/26/16 0531 01/11/17 1004  BILITOT 0.6 0.3  AST 18 11  ALT 13* 7  ALKPHOS 61 78  PROT 6.6 6.1  ALBUMIN 3.9 4.0    TUMOR MARKERS: No results for input(s): AFPTM, CEA, CA199, CHROMGRNA in the last 8760 hours.  Assessment and Plan:  History of Stroke  Previous cerebral angiography/angioplasty  Will proceed with cerebral angiography today to assess stability by Dr. Estanislado Pandy.  Risks and benefits of cerebral angiography were discussed with the patient including, but not limited to bleeding, infection, vascular injury, contrast induced renal failure, stroke or even death.  This interventional procedure involves the use of X-rays and because of the nature of the planned procedure, it is possible that we will have prolonged use of X-ray fluoroscopy.  Potential radiation risks to you include (but are not limited to) the following: - A slightly elevated risk for cancer  several years later in life. This risk is typically less than 0.5% percent. This risk is low in comparison to the normal incidence of human cancer, which is 33% for women and 50% for men according to the [place]. - Radiation induced injury can include skin redness, resembling a rash, tissue breakdown / ulcers and hair loss (which can be temporary or permanent).   The likelihood of either of these occurring depends on the difficulty of the procedure and whether you are sensitive to radiation due to previous procedures, disease, or genetic conditions.   IF your procedure requires a prolonged use of radiation, you will be notified and given written instructions for further action.  It is your responsibility to monitor the irradiated area for the 2 weeks following the procedure and to notify your physician if you are concerned that you have suffered a radiation  induced injury.    All of the patient's questions were answered, patient is  agreeable to proceed.  Consent signed and in chart.   Electronically Signed: Murrell Redden, PA-C 02/25/2017, 6:48 AM   I spent a total of    25 Minutes in face to face in clinical consultation, greater than 50% of which was counseling/coordinating care for cerebral angiography

## 2017-02-25 NOTE — Discharge Instructions (Addendum)
Outpatient Metformin Instructions (Glucophage, Glucovance, Fortamet, Riomet, Metaglip, Glumetza, Actoplus met  Avandamet, Janumet)   Patient: [name]                                                02/25/2017:    Radiology Exam:  Cerebral angiogram   As part of your exam today in the Radiology Department, you were given a radiographic contrast material or x-ray dye.  Because you have had this contrast material and you are taking a Metformin drug (Glucophage, Glucovance, Avandamet, Fortamet, Riomet, Metaglip, Glumetza, Actoplus met, Actoplus Met XR, Prandimet or Janumet), please observe the following instructions:   DO NOT  Take this medication for 48 hours after your exam.  Because you have normal renal function and have no comorbidities, you may restart your medication in 48 hours with no need for a renal function test or consultation with your physician.  You have normal renal function but have some comorbidities.  Comorbidities include liver disease, alcohol overuse, heart failure, myocardial or muscular ischemia, sepsis, or other severe infection.  Therefore you should consult your physician before restarting your medication.  You have impaired renal function.  You should consult your physician before restarting your medication and you are advised to get a renal function test before restarting your medication.  Please discuss this with your physician.   Call your doctor before you start taking this medication again.  Your doctor may want to check your kidney function before you start taking this medication again.  I understand these instructions and have had an opportunity to discuss them with Radiology Department personnel.      Cerebral Angiogram, Care After Refer to this sheet in the next few weeks. These instructions provide you with information on caring for yourself after your procedure. Your health care provider may also give you more specific instructions.  Your treatment has been planned according to current medical practices, but problems sometimes occur. Call your health care provider if you have any problems or questions after your procedure. What can I expect after the procedure? After your procedure, it is typical to have the following:  Bruising at the catheter insertion site that usually fades within 1-2 weeks.  Blood collecting in the tissue (hematoma) that may be painful to the touch. It should usually decrease in size and tenderness within 1-2 weeks.  A mild headache.  Follow these instructions at home:  Take medicines only as directed by your health care provider.  You may shower 24-48 hours after the procedure or as directed by your health care provider. Remove the bandage (dressing) and gently wash the site with plain soap and water. Pat the area dry with a clean towel. Do not rub the site, because this may cause bleeding.  Do not take baths, swim, or use a hot tub until your health care provider approves.  Check your insertion site every day for redness, swelling, or drainage.  Do not apply powder or lotion to the site.  Do not lift over 10 lb (4.5 kg) for 5 days after your procedure or as directed by your health care provider.  Ask your health care provider when it is okay to: ? Return to work or school. ? Resume usual physical activities or sports. ? Resume sexual activity.  Do not drive home if you are discharged the  same day as the procedure. Have someone else drive you.  You may drive 24 hours after the procedure unless otherwise instructed by your health care provider.  Do not operate machinery or power tools for 24 hours after the procedure or as directed by your health care provider.  If your procedure was done as an outpatient procedure, which means that you went home the same day as your procedure, a responsible adult should be with you for the first 24 hours after you arrive home.  Keep all follow-up visits  as directed by your health care provider. This is important. Contact a health care provider if:  You have a fever.  You have chills.  You have increased bleeding from the catheter insertion site. Hold pressure on the site. Get help right away if:  You have vision changes or loss of vision.  You have numbness or weakness on one side of your body.  You have difficulty talking, or you have slurred speech or cannot speak (aphasia).  You feel confused or have difficulty remembering.  You have unusual pain at the catheter insertion site.  You have redness, warmth, or swelling at the catheter insertion site.  You have drainage (other than a small amount of blood on the dressing) from the catheter insertion site.  The catheter insertion site is bleeding, and the bleeding does not stop after 30 minutes of holding steady pressure on the site. These symptoms may represent a serious problem that is an emergency. Do not wait to see if the symptoms will go away. Get medical help right away. Call your local emergency services (911 in [place] Do not drive yourself to the hospital. This information is not intended to replace advice given to you by your health care provider. Make sure you discuss any questions you have with your health care provider. Document Released: 09/18/2013 Document Revised: 10/10/2015 Document Reviewed: 05/17/2013 Elsevier Interactive Patient Education  2017 [employer] Moderate Conscious Sedation, Adult, Care After These instructions provide you with information about caring for yourself after your procedure. Your health care provider may also give you more specific instructions. Your treatment has been planned according to current medical practices, but problems sometimes occur. Call your health care provider if you have any problems or questions after your procedure. What can I expect after the procedure? After your procedure, it is common:  To feel sleepy for several  hours.  To feel clumsy and have poor balance for several hours.  To have poor judgment for several hours.  To vomit if you eat too soon.  Follow these instructions at home: For at least 24 hours after the procedure:   Do not: ? Participate in activities where you could fall or become injured. ? Drive. ? Use heavy machinery. ? Drink alcohol. ? Take sleeping pills or medicines that cause drowsiness. ? Make important decisions or sign legal documents. ? Take care of children on your own.  Rest. Eating and drinking  Follow the diet recommended by your health care provider.  If you vomit: ? Drink water, juice, or soup when you can drink without vomiting. ? Make sure you have little or no nausea before eating solid foods. General instructions  Have a responsible adult stay with you until you are awake and alert.  Take over-the-counter and prescription medicines only as told by your health care provider.  If you smoke, do not smoke without supervision.  Keep all follow-up visits as told by your health care provider. This is  important. Contact a health care provider if:  You keep feeling nauseous or you keep vomiting.  You feel light-headed.  You develop a rash.  You have a fever. Get help right away if:  You have trouble breathing. This information is not intended to replace advice given to you by your health care provider. Make sure you discuss any questions you have with your health care provider. Document Released: 02/22/2013 Document Revised: 10/07/2015 Document Reviewed: 08/24/2015 Elsevier Interactive Patient Education  [employer].

## 2017-02-25 NOTE — Procedures (Signed)
S/P bilateral common carotid arteriogram,and Lt vertebral marteriogram RT CFA approach. Findings. 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 2.Approc 70 % stenosis RT MCA prox just distal to its origin. 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

## 2017-02-25 NOTE — Sedation Documentation (Signed)
5 Fr exoseal to right groin

## 2017-02-25 NOTE — Sedation Documentation (Signed)
Called to give report. No beds available. Will call back 

## 2017-03-02 ENCOUNTER — Encounter (HOSPITAL_COMMUNITY): Payer: Self-pay | Admitting: Interventional Radiology

## 2017-03-10 DIAGNOSIS — R3914 Feeling of incomplete bladder emptying: Secondary | ICD-10-CM | POA: Diagnosis not present

## 2017-04-02 DIAGNOSIS — H6121 Impacted cerumen, right ear: Secondary | ICD-10-CM | POA: Diagnosis not present

## 2017-04-02 DIAGNOSIS — H6501 Acute serous otitis media, right ear: Secondary | ICD-10-CM | POA: Diagnosis not present

## 2017-04-02 DIAGNOSIS — M25561 Pain in right knee: Secondary | ICD-10-CM | POA: Diagnosis not present

## 2017-04-07 DIAGNOSIS — R3914 Feeling of incomplete bladder emptying: Secondary | ICD-10-CM | POA: Diagnosis not present

## 2017-05-05 DIAGNOSIS — L570 Actinic keratosis: Secondary | ICD-10-CM | POA: Diagnosis not present

## 2017-05-05 DIAGNOSIS — D692 Other nonthrombocytopenic purpura: Secondary | ICD-10-CM | POA: Diagnosis not present

## 2017-05-05 DIAGNOSIS — L723 Sebaceous cyst: Secondary | ICD-10-CM | POA: Diagnosis not present

## 2017-05-05 DIAGNOSIS — C44311 Basal cell carcinoma of skin of nose: Secondary | ICD-10-CM | POA: Diagnosis not present

## 2017-05-05 DIAGNOSIS — L821 Other seborrheic keratosis: Secondary | ICD-10-CM | POA: Diagnosis not present

## 2017-05-05 DIAGNOSIS — D1801 Hemangioma of skin and subcutaneous tissue: Secondary | ICD-10-CM | POA: Diagnosis not present

## 2017-05-05 DIAGNOSIS — D485 Neoplasm of uncertain behavior of skin: Secondary | ICD-10-CM | POA: Diagnosis not present

## 2017-05-05 DIAGNOSIS — R338 Other retention of urine: Secondary | ICD-10-CM | POA: Diagnosis not present

## 2017-05-05 DIAGNOSIS — Z85828 Personal history of other malignant neoplasm of skin: Secondary | ICD-10-CM | POA: Diagnosis not present

## 2017-05-06 ENCOUNTER — Encounter: Payer: Self-pay | Admitting: Cardiology

## 2017-05-06 ENCOUNTER — Ambulatory Visit (INDEPENDENT_AMBULATORY_CARE_PROVIDER_SITE_OTHER): Payer: Medicare Other | Admitting: Cardiology

## 2017-05-06 VITALS — BP 142/70 | HR 57 | Ht 73.0 in | Wt 217.6 lb

## 2017-05-06 DIAGNOSIS — I6522 Occlusion and stenosis of left carotid artery: Secondary | ICD-10-CM | POA: Diagnosis not present

## 2017-05-06 DIAGNOSIS — I714 Abdominal aortic aneurysm, without rupture, unspecified: Secondary | ICD-10-CM

## 2017-05-06 DIAGNOSIS — E785 Hyperlipidemia, unspecified: Secondary | ICD-10-CM | POA: Diagnosis not present

## 2017-05-06 DIAGNOSIS — I209 Angina pectoris, unspecified: Secondary | ICD-10-CM | POA: Diagnosis not present

## 2017-05-06 DIAGNOSIS — I10 Essential (primary) hypertension: Secondary | ICD-10-CM | POA: Diagnosis not present

## 2017-05-06 DIAGNOSIS — E1169 Type 2 diabetes mellitus with other specified complication: Secondary | ICD-10-CM | POA: Diagnosis not present

## 2017-05-06 DIAGNOSIS — I25119 Atherosclerotic heart disease of native coronary artery with unspecified angina pectoris: Secondary | ICD-10-CM

## 2017-05-06 NOTE — Patient Instructions (Signed)
Medication instructions  --start taking Losartan-HCTZ at lunch time   If Blood pressure is less than 120 ( top number) and you fill dizzy  Do not take the LOSARTAN-HCTZ.   If Blood pressure is great than 120 ,and you fill dizzy ,take 1/2 tablet   If blood pressure greater than 130  Take a whole tablet of Losartan.     Your physician wants you to follow-up in [place]. You will receive a reminder letter in the mail two months in advance. If you don't receive a letter, please call our office to schedule the follow-up appointment.   If you need a refill on your cardiac medications before your next appointment, please call your pharmacy.

## 2017-05-06 NOTE — Progress Notes (Signed)
PCP: Suzan Garibaldi, FNP  Clinic Note: Chief Complaint  Patient presents with  . Follow-up  . Coronary Artery Disease    PCI    HPI: [name] is a 83 y.o. male with a PMH below who presents today for six-month follow-up of CAD-CABG with non-STEMI and then also recent STEMI having had PCI to the SVG-RCA. History of stroke, diabetes, hypertension and hyperlipidemia.  [name] was last seen on November 11, 2016 -> he was doing relatively well overall from a cardiac standpoint, I simply saw him earlier than originally scheduled because his wife was there.  He noted that he did not do well on the hot weather, but was otherwise doing okay.  He says in the hot weather gets more short of breath.  He has not had any anginal pains.  Unfortunately he had recently been back in the hospital for evaluation of an episode of a TIA back in April.  By the time he got to the hospital and [facility], he says his symptoms are resolved.  Recent Hospitalizations: None since last visit  Studies Personally Reviewed - (if available, images/films reviewed: From Epic Chart or Care Everywhere)  None since last visit  Interval History: [name] returns today overall relatively stable from a cardiac standpoint.  He is able to do vacuuming around the house and other house chores without really noticing any significant symptoms.  The main thing he notices that if he overdoes it especially outside doing yard work, he will just simply feel quite fatigued and aware about for a while.  When he does this he notes that his heart rate goes up quite a bit with exertion.  At baseline routine minor exertion, he does not noticed that he has not had any recurrent chest tightness or pressure   With rest or exertion.  None of his anginal About 3 weeks ago he went to be evaluated for dizziness and just in general feeling poorly.  He was found to have a ear infection in the right ear with effusion this was leading him to be dizzy for  the most vertigo symptoms.  That seems to be clearing up. The only additional symptom studies noted over the last few months is that he is been having more episodes of GERD type symptoms depending on what food he eats.  His diet has had to be curtailed a bit.  He notes that in the morning and early afternoon, he sometimes feels very lethargic, as though he may fall asleep if he stops doing anything.  He has low energy and feels dizzy when he stands up.  Usually later on in the day this seems to be better.  He also feels pretty good in the morning when he first wakes up.  Remainder of cardiac review of symptoms: No PND, orthopnea or edema.  No palpitation, weakness or syncope/near syncope. No TIA/amaurosis fugax symptoms. No claudication.  ROS: A comprehensive was performed. Pertinent symptoms noted in history of present illness Review of Systems  Constitutional: Positive for malaise/fatigue (Overall less energy more notable when he overexerts.- ).  Respiratory: Negative for cough, sputum production, shortness of breath and wheezing.   Gastrointestinal: Positive for heartburn.  Musculoskeletal: Positive for joint pain. Negative for falls.  Neurological: Positive for dizziness (Sometimes positional).       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  All other systems reviewed and are negative.  I have reviewed and (if needed) personally updated  the patient's problem list, medications, allergies, past medical and surgical history, social and family history.   Past Medical History:  Diagnosis Date  . Abdominal aortic aneurysm ([place])    a. Aortic duplex 06/2014: mild aneurysmal dilatation of proximal abdominal aorta measuring 3.4x3.4cm. No sig change from 2012. F/u due 06/2016;   . AKI (acute kidney injury) ([place]) 03/28/2016  . Arthritis    "right knee; never bothered me" (03/26/2016)  . 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 CAD in native artery    a. NSTEMI 11/2010 - CABG x2(LIMA to LAD, SVG to PDA). b. NEG Lexi MV 10/24/13, EF 53%, no perfusion abnormality, septal and apical HK noted. c. NSTEMI 05/2014 - s/p DES to SVG-RPDA 06/18/14 (Xience Alpine DES 3.0 x 18 mm -3.35 mm), EF 60-65; d. 03/2016 STEMI/PCI: LM nl, ost LAD 70%, mLCx 50%, pRCA 95% - mRCA 60%, dRPDA 70%, LIMA->LAD ok, o-p SVG->RPDA 100% (3.0x16 Promus DES overlapping prior stent).  . Diastolic dysfunction    a. 03/2016 Echo: EF 60-65%, no rwma, Gr1 DD, triv AI, Ao root 35mm, Asc Ao 47mm, triv MR.  . Dizziness    a. Carotid duplex 03/2014: mild fibrous plaque, no significant stenosis.  . Foley catheter in place    "been wearing it for a couple months now" (03/26/2016)  . HTN (hypertension)   . Hyperlipidemia LDL goal <70   . Non-STEMI (non-ST elevated myocardial infarction) ([place]) 11/2010   1. Ostial LAD 70% (to close to [facility] for PCI), subtotal occlusion of the RCA  . Postoperative atrial fibrillation ([place]) 11/2010   Post CABG, no sign recurrence  . Refusal of blood transfusions as patient is Jehovah's Witness   . 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 Servando Snare)  . Sleep apnea    Not on CPAP. (03/26/2016)  . ST elevation myocardial infarction (STEMI) of inferior wall ([place]) 04/05/2016   Occluded in-stent restenosis/thrombosis of SVG-RCA --> treated with overlapping Promus DES 3.0 mm 16 mm postdilated 3.0 mm).  . Stroke [facility]) 2014; 01/2015   a. 2014 with mild right hand weakness, nonhemorrhagic per pt.;; b. - PTA-Stent L ICA 95%   . Type II diabetes mellitus ([place])     Past Surgical History:  Procedure Laterality Date  . APPENDECTOMY    . CARDIAC CATHETERIZATION  12/11/2010   Dr. Chase Picket - subsequent cath - normal LV systolic function, no renal artery stenosis, severe 2-vessel disease with subtotaled RCA prox and distal 60% lesion and complex 70% area of narrowing of ostium of LAD  . CARDIAC CATHETERIZATION  N/A 03/26/2016   Procedure: Left Heart Cath and Coronary Angiography;  Surgeon: Nelva Bush, MD;  Location: [place] CV LAB;  Service: Cardiovascular;  Laterality: N/A;  . CARDIAC CATHETERIZATION N/A 03/26/2016   Procedure: Coronary Stent Intervention;  Surgeon: Nelva Bush, MD;  Location: [place] CV LAB;  Service: Cardiovascular: 100% In-stent thrombosis of pros SVG-RCA (Xience DES) --> treated with PromusDES 3.0 x 18 (3.3 mm)  . CARDIAC CATHETERIZATION N/A 03/26/2016   Procedure: Bypass Graft Angiography;  Surgeon: Nelva Bush, MD;  Location: [place] CV LAB;  Service: Cardiovascular;  Laterality: N/A;  . CORONARY ANGIOPLASTY WITH STENT PLACEMENT  06/18/2014   PCI to SVG-RPDA 06/18/14 (Xience Alpine DES 3.0 x 18 mm -3.35 mm),   . CORONARY ARTERY BYPASS GRAFT  12/15/2010   X2, Dr Servando Snare; LIMA to LAD, SVG to PDA;   . CYSTOSCOPY  WITH URETHRAL DILATATION    . IR ANGIO INTRA EXTRACRAN SEL COM CAROTID INNOMINATE BILAT MOD SED  02/25/2017  . IR ANGIO VERTEBRAL SEL SUBCLAVIAN INNOMINATE UNI L MOD SED  02/25/2017  . IR GENERIC HISTORICAL  01/21/2016   IR RADIOLOGIST EVAL & MGMT 01/21/2016 [facility]-INTERV RAD  . IR GENERIC HISTORICAL  02/03/2016   IR CATHETER TUBE CHANGE 02/03/2016 Marybelle Killings, MD [facility]-INTERV RAD  . LEFT HEART CATHETERIZATION WITH CORONARY ANGIOGRAM N/A 06/18/2014   Procedure: LEFT HEART CATHETERIZATION WITH CORONARY ANGIOGRAM;  Surgeon: Leonie Man, MD;  Location: [facility] CATH LAB;  Service: Cardiovascular;  -- severe disease of SVG-rPDA  . RADIOLOGY WITH ANESTHESIA N/A 01/24/2015   Procedure: STENT ASSISTED ANGIOPLASTY (RADIOLOGY WITH ANESTHESIA);  Surgeon: Luanne Bras, MD;  Location: [place];  Service: Radiology;  Laterality: N/A;  . TONSILLECTOMY    . TRANSTHORACIC ECHOCARDIOGRAM  07/28/2012; 03/25/2015:   a. EF 55-60%, severe Conc LVH, Nl Systolic fxn, G1 DD,  Ao Sclerosis w/o stenosis; CRO PFO;; b. Moderate concentric LVH. EF 60-65%. No RWMA, Gr 1 DD. Mild LA dilation  .  TRANSTHORACIC ECHOCARDIOGRAM  03/2016   Hypokinesis of the distal inferior wall. On her concentric hypertrophy. EF 60-65%.  GR 1 DD. Ascending aorta/root 45-46 mm.   Post-PCI Diagram  Nov 2017: PCI to SVG-rPDA ISR/thrombosis - Promus Premier DES 3.0 x 16 (3.60mm)            Current Meds  Medication Sig  . amLODipine (NORVASC) 10 MG tablet Take 10 mg by mouth every evening.   Marland Kitchen aspirin 81 MG chewable tablet Chew 1 tablet (81 mg total) by mouth daily.  . clopidogrel (PLAVIX) 75 MG tablet Take 1 tablet (75 mg total) by mouth daily.  Marland Kitchen glimepiride (AMARYL) 4 MG tablet Take 4 mg by mouth 2 (two) times daily with a meal.   . hydrALAZINE (APRESOLINE) 50 MG tablet Take 1 tablet (50 mg total) by mouth 2 (two) times daily.  Marland Kitchen labetalol (NORMODYNE) 200 MG tablet Take 100 mg by mouth 2 (two) times daily.   Marland Kitchen losartan-hydrochlorothiazide (HYZAAR) 100-25 MG per tablet Take 1 tablet by mouth every morning.   . metFORMIN (GLUCOPHAGE) 1000 MG tablet Take 1 tablet (1,000 mg total) by mouth 2 (two) times daily.  . nitroGLYCERIN (NITROSTAT) 0.4 MG SL tablet Place 1 tablet (0.4 mg total) under the tongue every 5 (five) minutes as needed for chest pain (up to 3 doses).    Allergies  Allergen Reactions  . Atorvastatin     Myalgias  . Crestor [Rosuvastatin]   . Other Other (See Comments)    No  BLOOD PRODUCTS - Pt is Jeohovah's Witness  . Pravastatin     Myalgias  . Simvastatin     Myalgias     Social History   Socioeconomic History  . Marital status: Married    Spouse name: [name]  . Number of children: 1  . Years of education: None  . Highest education level: None  Social Needs  . Financial resource strain: None  . Food insecurity - worry: None  . Food insecurity - inability: None  . Transportation needs - medical: None  . Transportation needs - non-medical: None  Occupational History    Employer: RETIRED  Tobacco Use  . Smoking status: Former Smoker    Packs/day: 0.33    Years: 10.00     Pack years: 3.30    Types: Cigarettes    Last attempt to quit: 1961    Years since  quitting: 58.0  . Smokeless tobacco: Never Used  Substance and Sexual Activity  . Alcohol use: No  . Drug use: No  . Sexual activity: No  Other Topics Concern  . None  Social History Narrative   Married father of one.  Previously uses stationary bike routinely.  Now reduced due to other social stressors.   Quit smoking 50 years ago.  Does not drink alcohol    family history includes Heart Problems (age of onset: 43) in his father.  Wt Readings from Last 3 Encounters:  05/06/17 217 lb 9.6 oz (98.7 kg)  02/25/17 215 lb (97.5 kg)  12/22/16 218 lb (98.9 kg)    PHYSICAL EXAM BP (!) 142/70   Pulse (!) 57   Ht 6\' 1"  (1.854 m)   Wt 217 lb 9.6 oz (98.7 kg)   BMI 28.71 kg/m   Physical Exam  Constitutional: He is oriented to person, place, and time. He appears well-developed and well-nourished. No distress.  Relatively healthy-appearing.  Well-groomed.  HENT:  Head: Normocephalic and atraumatic.  Eyes: Conjunctivae and EOM are normal. No scleral icterus.  Neck: Normal range of motion. Neck supple. No hepatojugular reflux and no JVD present. Carotid bruit is not present.  Cardiovascular: Normal rate, regular rhythm and normal pulses.  Occasional extrasystoles are present. PMI is not displaced (Difficult to palpate). Exam reveals distant heart sounds. Exam reveals no gallop and no friction rub.  Murmur heard.  Medium-pitched harsh crescendo-decrescendo early systolic murmur is present with a grade of 1/6 at the upper right sternal border radiating to the neck. No HJR  Pulmonary/Chest: Effort normal and breath sounds normal. No respiratory distress. He has no wheezes. He has no rales.  Abdominal: Soft. Bowel sounds are normal. He exhibits no distension. There is no tenderness. There is no rebound.  Musculoskeletal: Normal range of motion. He exhibits no edema.  R hand weak with mild contractures &  plegia post CVA - stable.  Neurological: He is alert and oriented to person, place, and time.  Skin: Skin is warm and dry. No erythema.  Psychiatric: He has a normal mood and affect. His behavior is normal. Judgment and thought content normal.  Nursing note and vitals reviewed.   Adult ECG Report  Other studies Reviewed: Additional studies/ records that were reviewed today include:  Recent Labs:  n/a  Lab Results  Component Value Date   CHOL 134 01/11/2017   HDL 40 01/11/2017   LDLCALC 77 01/11/2017   TRIG 83 01/11/2017   CHOLHDL 3.4 01/11/2017    ASSESSMENT / PLAN: Problem List Items Addressed This Visit    Abdominal aortic aneurysm ([place]) (Chronic)    He is due for follow-up of his AAA.  This should be done in the spring, but if not done prior to his follow-up visit in 6 months we will order at that time.      Coronary artery disease involving native coronary artery of native heart with angina pectoris ([place]) - Primary (Chronic)    He has severe native CAD noted on his cath films.  He basically has a patent LIMA LAD and stent with in-stent in the SVG-RCA.  The RCA itself is occluded and he has moderate disease in the circumflex. Not actively having any angina symptoms since PCI to SVG to PDA.  He is on aspirin and Plavix and he has just completed 1 year therapy.  Based on the fact that he had in-stent thrombosis of a stent that was previously placed in  the right vein graft, I would prefer to keep him on antiplatelet agents for now.  We may consider stopping aspirin in the near future.  He is also on stable dose of beta-blocker (albeit labetalol as opposed to carvedilol or metoprolol which would be my choice).  He is also on amlodipine for antianginal and blood pressure effect along with losartan-HCTZ.      Hyperlipidemia associated with type 2 diabetes mellitus (HCC) (Chronic)    Currently not on a single statin.  His most recent lab from August shows that his LDL looks fairly well  controlled.  If his LDL continues to be as close as it is to goal at this point, I would be reluctant to push harder in an 83 year old be potentially considering Zetia.      Moderate essential hypertension (Chronic)    His blood pressure is borderline elevated today.  He is on amlodipine and hydralazine 50 twice daily along with losartan-HCTZ.  Because he is on so many medications, I would like to try to avoid him having the hypotension type symptoms of dizziness and lightheadedness as well as fatigue during the day.  What I would like to do is have him take amlodipine at night, and take losartan at lunchtime as opposed to early morning.  He is already taking twice daily labetalol and hydralazine.  By allowing him to take the ARB-HCTZ medication at lunchtime, we do not have doubling up of blood pressure medications. Hopefully this will prevent him of having significant blood pressure drops with potential near syncope or syncope or simply just falls and injury.         Current medicines are reviewed at length with the patient today. (+/- concerns) n/a The following changes have been made: n/a  Patient Instructions  Medication instructions  --start taking Losartan-HCTZ at lunch time   If Blood pressure is less than 120 ( top number) and you fill dizzy  Do not take the LOSARTAN-HCTZ.   If Blood pressure is great than 120 ,and you fill dizzy ,take 1/2 tablet   If blood pressure greater than 130  Take a whole tablet of Losartan.     Your physician wants you to follow-up in [place]. You will receive a reminder letter in the mail two months in advance. If you don't receive a letter, please call our office to schedule the follow-up appointment.   If you need a refill on your cardiac medications before your next appointment, please call your pharmacy.    Studies Ordered:   No orders of the defined types were placed in this encounter.     Glenetta Hew, M.D.,  M.S. Interventional Cardiologist   Pager # [phone] Phone # [phone] [address] [place], [place] [address]

## 2017-05-09 ENCOUNTER — Encounter: Payer: Self-pay | Admitting: Cardiology

## 2017-05-09 NOTE — Assessment & Plan Note (Signed)
Currently not on a single statin.  His most recent lab from August shows that his LDL looks fairly well controlled.  If his LDL continues to be as close as it is to goal at this point, I would be reluctant to push harder in an 83 year old be potentially considering Zetia.

## 2017-05-09 NOTE — Assessment & Plan Note (Signed)
His blood pressure is borderline elevated today.  He is on amlodipine and hydralazine 50 twice daily along with losartan-HCTZ.  Because he is on so many medications, I would like to try to avoid him having the hypotension type symptoms of dizziness and lightheadedness as well as fatigue during the day.  What I would like to do is have him take amlodipine at night, and take losartan at lunchtime as opposed to early morning.  He is already taking twice daily labetalol and hydralazine.  By allowing him to take the ARB-HCTZ medication at lunchtime, we do not have doubling up of blood pressure medications. Hopefully this will prevent him of having significant blood pressure drops with potential near syncope or syncope or simply just falls and injury.

## 2017-05-09 NOTE — Assessment & Plan Note (Signed)
He is due for follow-up of his AAA.  This should be done in the spring, but if not done prior to his follow-up visit in 6 months we will order at that time.

## 2017-05-09 NOTE — Assessment & Plan Note (Signed)
He has severe native CAD noted on his cath films.  He basically has a patent LIMA LAD and stent with in-stent in the SVG-RCA.  The RCA itself is occluded and he has moderate disease in the circumflex. Not actively having any angina symptoms since PCI to SVG to PDA.  He is on aspirin and Plavix and he has just completed 1 year therapy.  Based on the fact that he had in-stent thrombosis of a stent that was previously placed in the right vein graft, I would prefer to keep him on antiplatelet agents for now.  We may consider stopping aspirin in the near future.  He is also on stable dose of beta-blocker (albeit labetalol as opposed to carvedilol or metoprolol which would be my choice).  He is also on amlodipine for antianginal and blood pressure effect along with losartan-HCTZ.

## 2017-05-12 DIAGNOSIS — E119 Type 2 diabetes mellitus without complications: Secondary | ICD-10-CM | POA: Diagnosis not present

## 2017-05-12 DIAGNOSIS — N390 Urinary tract infection, site not specified: Secondary | ICD-10-CM | POA: Diagnosis not present

## 2017-05-12 DIAGNOSIS — E7800 Pure hypercholesterolemia, unspecified: Secondary | ICD-10-CM | POA: Diagnosis not present

## 2017-05-12 DIAGNOSIS — R300 Dysuria: Secondary | ICD-10-CM | POA: Diagnosis not present

## 2017-05-12 DIAGNOSIS — R310 Gross hematuria: Secondary | ICD-10-CM | POA: Diagnosis not present

## 2017-05-12 DIAGNOSIS — I10 Essential (primary) hypertension: Secondary | ICD-10-CM | POA: Diagnosis not present

## 2017-05-12 DIAGNOSIS — M1990 Unspecified osteoarthritis, unspecified site: Secondary | ICD-10-CM | POA: Diagnosis not present

## 2017-05-12 DIAGNOSIS — E039 Hypothyroidism, unspecified: Secondary | ICD-10-CM | POA: Diagnosis not present

## 2017-05-12 DIAGNOSIS — N419 Inflammatory disease of prostate, unspecified: Secondary | ICD-10-CM | POA: Diagnosis not present

## 2017-05-21 DIAGNOSIS — N289 Disorder of kidney and ureter, unspecified: Secondary | ICD-10-CM | POA: Diagnosis not present

## 2017-05-21 DIAGNOSIS — I10 Essential (primary) hypertension: Secondary | ICD-10-CM | POA: Diagnosis not present

## 2017-05-21 DIAGNOSIS — N189 Chronic kidney disease, unspecified: Secondary | ICD-10-CM | POA: Diagnosis not present

## 2017-05-21 DIAGNOSIS — B354 Tinea corporis: Secondary | ICD-10-CM | POA: Diagnosis not present

## 2017-05-21 DIAGNOSIS — I6521 Occlusion and stenosis of right carotid artery: Secondary | ICD-10-CM | POA: Diagnosis not present

## 2017-05-21 DIAGNOSIS — N390 Urinary tract infection, site not specified: Secondary | ICD-10-CM | POA: Diagnosis not present

## 2017-05-21 DIAGNOSIS — R310 Gross hematuria: Secondary | ICD-10-CM | POA: Diagnosis not present

## 2017-06-21 DIAGNOSIS — R609 Edema, unspecified: Secondary | ICD-10-CM | POA: Diagnosis not present

## 2017-06-21 DIAGNOSIS — S9031XA Contusion of right foot, initial encounter: Secondary | ICD-10-CM | POA: Diagnosis not present

## 2017-06-21 DIAGNOSIS — M109 Gout, unspecified: Secondary | ICD-10-CM | POA: Diagnosis not present

## 2017-06-25 DIAGNOSIS — K9410 Enterostomy complication, unspecified: Secondary | ICD-10-CM | POA: Diagnosis not present

## 2017-06-25 DIAGNOSIS — N35013 Post-traumatic anterior urethral stricture: Secondary | ICD-10-CM | POA: Diagnosis not present

## 2017-06-25 DIAGNOSIS — R3914 Feeling of incomplete bladder emptying: Secondary | ICD-10-CM | POA: Diagnosis not present

## 2017-07-01 DIAGNOSIS — R52 Pain, unspecified: Secondary | ICD-10-CM | POA: Diagnosis not present

## 2017-07-01 DIAGNOSIS — L03115 Cellulitis of right lower limb: Secondary | ICD-10-CM | POA: Diagnosis not present

## 2017-07-02 DIAGNOSIS — E119 Type 2 diabetes mellitus without complications: Secondary | ICD-10-CM | POA: Diagnosis not present

## 2017-07-02 DIAGNOSIS — L03115 Cellulitis of right lower limb: Secondary | ICD-10-CM | POA: Diagnosis not present

## 2017-07-02 DIAGNOSIS — R52 Pain, unspecified: Secondary | ICD-10-CM | POA: Diagnosis not present

## 2017-07-02 DIAGNOSIS — I10 Essential (primary) hypertension: Secondary | ICD-10-CM | POA: Diagnosis not present

## 2017-07-13 DIAGNOSIS — R609 Edema, unspecified: Secondary | ICD-10-CM | POA: Diagnosis not present

## 2017-07-13 DIAGNOSIS — E119 Type 2 diabetes mellitus without complications: Secondary | ICD-10-CM | POA: Diagnosis not present

## 2017-07-13 DIAGNOSIS — I10 Essential (primary) hypertension: Secondary | ICD-10-CM | POA: Diagnosis not present

## 2017-08-06 DIAGNOSIS — R338 Other retention of urine: Secondary | ICD-10-CM | POA: Diagnosis not present

## 2017-08-06 DIAGNOSIS — N35013 Post-traumatic anterior urethral stricture: Secondary | ICD-10-CM | POA: Diagnosis not present

## 2017-08-18 DIAGNOSIS — R609 Edema, unspecified: Secondary | ICD-10-CM | POA: Diagnosis not present

## 2017-08-18 DIAGNOSIS — M79671 Pain in right foot: Secondary | ICD-10-CM | POA: Diagnosis not present

## 2017-08-18 DIAGNOSIS — M25561 Pain in right knee: Secondary | ICD-10-CM | POA: Diagnosis not present

## 2017-08-19 DIAGNOSIS — M7731 Calcaneal spur, right foot: Secondary | ICD-10-CM | POA: Diagnosis not present

## 2017-08-19 DIAGNOSIS — M19071 Primary osteoarthritis, right ankle and foot: Secondary | ICD-10-CM | POA: Diagnosis not present

## 2017-08-19 DIAGNOSIS — M1711 Unilateral primary osteoarthritis, right knee: Secondary | ICD-10-CM | POA: Diagnosis not present

## 2017-08-24 ENCOUNTER — Telehealth: Payer: Self-pay | Admitting: Physician Assistant

## 2017-08-24 DIAGNOSIS — I10 Essential (primary) hypertension: Secondary | ICD-10-CM | POA: Diagnosis not present

## 2017-08-24 DIAGNOSIS — R079 Chest pain, unspecified: Secondary | ICD-10-CM | POA: Diagnosis not present

## 2017-08-24 NOTE — Telephone Encounter (Signed)
[name] called his PCP and said he was having problems with his blood pressure with chest pain.  She was able to see him 4/9 p.m.  She called because she was concerned about his symptoms.  Although he is not currently having chest pain, he has had some chest pain recently.  His blood pressure has been elevated, greater than 200/90 at times.  He is not currently having chest pain and has refused to go to the emergency room.  She wanted to add clonidine, but instead I recommended that she increase his labetalol to 1 whole tablet twice daily and increase the hydralazine to 150 mg twice daily.  [name], [facility], has an appointment available at 830 and I was able to book this.  I have sent a text message as well as a staff message to make the  [name], [facility] and the nursing staff aware.  His PCP told [name] to be at the office at 815 tomorrow morning.  He is instructed to go to the emergency room if he gets additional chest pain.  Rosaria Ferries, PA-C 08/24/2017 6:03 PM Beeper [phone]

## 2017-08-24 NOTE — Telephone Encounter (Signed)
Thank you. Will access during the office visit tomorrow.

## 2017-08-25 ENCOUNTER — Encounter: Payer: Self-pay | Admitting: Physician Assistant

## 2017-08-25 ENCOUNTER — Ambulatory Visit (INDEPENDENT_AMBULATORY_CARE_PROVIDER_SITE_OTHER): Payer: Medicare Other | Admitting: Physician Assistant

## 2017-08-25 VITALS — BP 162/76 | HR 54 | Ht 73.0 in | Wt 229.0 lb

## 2017-08-25 DIAGNOSIS — I714 Abdominal aortic aneurysm, without rupture, unspecified: Secondary | ICD-10-CM

## 2017-08-25 DIAGNOSIS — E119 Type 2 diabetes mellitus without complications: Secondary | ICD-10-CM

## 2017-08-25 DIAGNOSIS — E785 Hyperlipidemia, unspecified: Secondary | ICD-10-CM | POA: Diagnosis not present

## 2017-08-25 DIAGNOSIS — R600 Localized edema: Secondary | ICD-10-CM | POA: Diagnosis not present

## 2017-08-25 DIAGNOSIS — N179 Acute kidney failure, unspecified: Secondary | ICD-10-CM | POA: Diagnosis not present

## 2017-08-25 DIAGNOSIS — I25709 Atherosclerosis of coronary artery bypass graft(s), unspecified, with unspecified angina pectoris: Secondary | ICD-10-CM | POA: Diagnosis not present

## 2017-08-25 DIAGNOSIS — R0602 Shortness of breath: Secondary | ICD-10-CM | POA: Diagnosis not present

## 2017-08-25 DIAGNOSIS — I200 Unstable angina: Secondary | ICD-10-CM | POA: Diagnosis not present

## 2017-08-25 DIAGNOSIS — Z01818 Encounter for other preprocedural examination: Secondary | ICD-10-CM | POA: Diagnosis not present

## 2017-08-25 DIAGNOSIS — I480 Paroxysmal atrial fibrillation: Secondary | ICD-10-CM

## 2017-08-25 DIAGNOSIS — I10 Essential (primary) hypertension: Secondary | ICD-10-CM | POA: Diagnosis not present

## 2017-08-25 DIAGNOSIS — Y840 Cardiac catheterization as the cause of abnormal reaction of the patient, or of later complication, without mention of misadventure at the time of the procedure: Secondary | ICD-10-CM | POA: Diagnosis not present

## 2017-08-25 LAB — BASIC METABOLIC PANEL
BUN/Creatinine Ratio: 18 (ref 10–24)
BUN: 22 mg/dL (ref 8–27)
CO2: 24 mmol/L (ref 20–29)
Calcium: 9.4 mg/dL (ref 8.6–10.2)
Chloride: 103 mmol/L (ref 96–106)
Creatinine, Ser: 1.25 mg/dL (ref 0.76–1.27)
GFR calc Af Amer: 61 mL/min/{1.73_m2} (ref >59)
GFR, EST NON AFRICAN AMERICAN: 53 mL/min/{1.73_m2} — AB (ref >59)
Glucose: 138 mg/dL — ABNORMAL HIGH (ref 65–99)
POTASSIUM: 5.1 mmol/L (ref 3.5–5.2)
SODIUM: 142 mmol/L (ref 134–144)

## 2017-08-25 LAB — PT AND PTT
INR: 1 (ref 0.8–1.2)
PROTHROMBIN TIME: 9.9 s (ref 9.1–12.0)
aPTT: 29 s (ref 24–33)

## 2017-08-25 LAB — CBC WITH DIFFERENTIAL/PLATELET
Basophils Absolute: 0 10*3/uL (ref 0.0–0.2)
Basos: 0 %
EOS (ABSOLUTE): 0.3 10*3/uL (ref 0.0–0.4)
EOS: 4 %
HEMATOCRIT: 32.7 % — AB (ref 37.5–51.0)
Hemoglobin: 10.6 g/dL — ABNORMAL LOW (ref 13.0–17.7)
Immature Grans (Abs): 0.1 10*3/uL (ref 0.0–0.1)
Immature Granulocytes: 1 %
LYMPHS ABS: 1.7 10*3/uL (ref 0.7–3.1)
Lymphs: 30 %
MCH: 29.2 pg (ref 26.6–33.0)
MCHC: 32.4 g/dL (ref 31.5–35.7)
MCV: 90 fL (ref 79–97)
MONOS ABS: 0.9 10*3/uL (ref 0.1–0.9)
Monocytes: 16 %
NEUTROS PCT: 49 %
Neutrophils Absolute: 2.8 10*3/uL (ref 1.4–7.0)
PLATELETS: 274 10*3/uL (ref 150–379)
RBC: 3.63 x10E6/uL — AB (ref 4.14–5.80)
RDW: 16.7 % — AB (ref 12.3–15.4)
WBC: 5.7 10*3/uL (ref 3.4–10.8)

## 2017-08-25 MED ORDER — FUROSEMIDE 20 MG PO TABS: 20.0000 mg | ORAL_TABLET | Freq: Every day | ORAL | 2 refills | Status: DC | PRN

## 2017-08-25 MED ORDER — ISOSORBIDE MONONITRATE ER 30 MG PO TB24: 30.0000 mg | ORAL_TABLET | Freq: Every day | ORAL | 6 refills | Status: DC

## 2017-08-25 MED ORDER — NITROGLYCERIN 0.4 MG SL SUBL: 0.4000 mg | SUBLINGUAL_TABLET | SUBLINGUAL | 3 refills | Status: DC | PRN

## 2017-08-25 NOTE — H&P (View-Only) (Signed)
Cardiology Office Note    Date:  08/25/2017   ID:  ALPER GUILMETTE, DOB 1930/05/31, MRN [id number]  PCP:  Suzan Garibaldi, FNP  Cardiologist:  Dr. Ellyn Hack  Chief Complaint  Patient presents with  . Follow-up    seen for Dr. Ellyn Hack. Eval for chest pain    History of Present Illness:  [name] is a 84 y.o. male with PMH of AAA, CAD s/p CABG (LIMA to LAD, SVG to PDA) 12/15/2010 by Dr. Servando Snare, HTN, HLD, DM II, post op afib (after CABG, no recurrence), and OSA not on CPAP. He is a Restaurant manager, fast food.  He was admitted for NSTEMI in January 2016 and underwent DES to SVG and RPDA.  He was admitted for STEMI in November 2017, cardiac catheterization obtained on 03/26/2016 showed significant native artery disease was chronic 99% proximal RCA lesion, 70% ostial LAD lesion, patent LIMA to LAD, acutely in-stent restenosis/thrombosis of SVG to PDA treated with 3.0 x 16 mm DES, post PCI, occlusion of the distal PDA was noted at the site of previous 70% stenosis.  Echocardiogram obtained on the same day showed EF 60-65%, grade 1 DD, ascending aortic diameter 45 mm.  He is last office visit with Dr. Ellyn Hack was in December 2018.  Patient presents today for evaluation of chest pain.  He used to have chest pain mainly when he does things and they would quickly go away with resting.  Since yesterday, he started having chest pain at rest lasting up to a minute each time and multiple times throughout the day.  He also developed lower extremity edema for the past 6 weeks.  I will placed him on a as needed dose of 20 mg Lasix.  For his anginal symptom, it is concerning for progressive angina, I will add on Imdur 30 mg daily.  I have discussed the case with Dr. Ellyn Hack, our recommendation is to proceed with cardiac catheterization.  I have discussed with the patient benefit and risk of the procedure, he was agreeable to proceed.  He is also due for abdominal ultrasound for his AAA.  We will refill his nitroglycerin.   He is aware that he needs to take nitroglycerin if chest pain recurs.   Past Medical History:  Diagnosis Date  . Abdominal aortic aneurysm ([name])    a. Aortic duplex 06/2014: mild aneurysmal dilatation of proximal abdominal aorta measuring 3.4x3.4cm. No sig change from 2012. F/u due 06/2016;   . AKI (acute kidney injury) ([place]) 03/28/2016  . Arthritis    "right knee; never bothered me" (03/26/2016)  . 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 CAD in native artery    a. NSTEMI 11/2010 - CABG x2(LIMA to LAD, SVG to PDA). b. NEG Lexi MV 10/24/13, EF 53%, no perfusion abnormality, septal and apical HK noted. c. NSTEMI 05/2014 - s/p DES to SVG-RPDA 06/18/14 (Xience Alpine DES 3.0 x 18 mm -3.35 mm), EF 60-65; d. 03/2016 STEMI/PCI: LM nl, ost LAD 70%, mLCx 50%, pRCA 95% - mRCA 60%, dRPDA 70%, LIMA->LAD ok, o-p SVG->RPDA 100% (3.0x16 Promus DES overlapping prior stent).  . Diastolic dysfunction    a. 03/2016 Echo: EF 60-65%, no rwma, Gr1 DD, triv AI, Ao root 33mm, Asc Ao 47mm, triv MR.  . Dizziness    a. Carotid duplex 03/2014: mild fibrous plaque, no significant stenosis.  . Foley catheter in place    "been wearing it for a couple months now" (03/26/2016)  . HTN (  hypertension)   . Hyperlipidemia LDL goal <70   . Non-STEMI (non-ST elevated myocardial infarction) ([place]) 11/2010   1. Ostial LAD 70% (to close to [facility] for PCI), subtotal occlusion of the RCA  . Postoperative atrial fibrillation ([place]) 11/2010   Post CABG, no sign recurrence  . Refusal of blood transfusions as patient is Jehov[employer]'s Witness   . 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 Servando Snare)  . Sleep apnea    Not on CPAP. (03/26/2016)  . ST elevation myocardial infarction (STEMI) of inferior wall ([place]) 04/05/2016   Occluded in-stent restenosis/thrombosis of SVG-RCA --> treated with overlapping Promus DES 3.0 mm 16 mm postdilated 3.0 mm).  . Stroke [facility]) 2014; 01/2015   a. 2014 with mild right hand  weakness, nonhemorrhagic per pt.;; b. - PTA-Stent L ICA 95%   . Type II diabetes mellitus ([place])     Past Surgical History:  Procedure Laterality Date  . APPENDECTOMY    . CARDIAC CATHETERIZATION  12/11/2010   Dr. Chase Picket - subsequent cath - normal LV systolic function, no renal artery stenosis, severe 2-vessel disease with subtotaled RCA prox and distal 60% lesion and complex 70% area of narrowing of ostium of LAD  . CARDIAC CATHETERIZATION N/A 03/26/2016   Procedure: Left Heart Cath and Coronary Angiography;  Surgeon: Nelva Bush, MD;  Location: [place] CV LAB;  Service: Cardiovascular;  Laterality: N/A;  . CARDIAC CATHETERIZATION N/A 03/26/2016   Procedure: Coronary Stent Intervention;  Surgeon: Nelva Bush, MD;  Location: [place] CV LAB;  Service: Cardiovascular: 100% In-stent thrombosis of pros SVG-RCA (Xience DES) --> treated with PromusDES 3.0 x 18 (3.3 mm)  . CARDIAC CATHETERIZATION N/A 03/26/2016   Procedure: Bypass Graft Angiography;  Surgeon: Nelva Bush, MD;  Location: [place] CV LAB;  Service: Cardiovascular;  Laterality: N/A;  . CORONARY ANGIOPLASTY WITH STENT PLACEMENT  06/18/2014   PCI to SVG-RPDA 06/18/14 (Xience Alpine DES 3.0 x 18 mm -3.35 mm),   . CORONARY ARTERY BYPASS GRAFT  12/15/2010   X2, Dr Servando Snare; LIMA to LAD, SVG to PDA;   . CYSTOSCOPY WITH URETHRAL DILATATION    . IR ANGIO INTRA EXTRACRAN SEL COM CAROTID INNOMINATE BILAT MOD SED  02/25/2017  . IR ANGIO VERTEBRAL SEL SUBCLAVIAN INNOMINATE UNI L MOD SED  02/25/2017  . IR GENERIC HISTORICAL  01/21/2016   IR RADIOLOGIST EVAL & MGMT 01/21/2016 [facility]INTER[facility]  . IR GENERIC HISTORICAL  02/03/2016   IR CATHETER TUBE CHANGE 02/03/2016 Marybelle Killings, MD [facility]-INTERV RAD  . LEFT HEART CATHETERIZATION WITH CORONARY ANGIOGRAM N/A 06/18/2014   Procedure: LEFT HEART CATHETERIZATION WITH CORONARY ANGIOGRAM;  Surgeon: Leonie Man, MD;  Location: [facility] CATH LAB;  Service: Cardiovascular;  -- severe disease of SVG-rPDA    . RADIOLOGY WITH ANESTHESIA N/A 01/24/2015   Procedure: STENT ASSISTED ANGIOPLASTY (RADIOLOGY WITH ANESTHESIA);  Surgeon: Luanne Bras, MD;  Location: [place];  Service: Radiology;  Laterality: N/A;  . TONSILLECTOMY    . TRANSTHORACIC ECHOCARDIOGRAM  07/28/2012; 03/25/2015:   a. EF 55-60%, severe Conc LVH, Nl Systolic fxn, G1 DD,  Ao Sclerosis w/o stenosis; CRO PFO;; b. Moderate concentric LVH. EF 60-65%. No RWMA, Gr 1 DD. Mild LA dilation  . TRANSTHORACIC ECHOCARDIOGRAM  03/2016   Hypokinesis of the distal inferior wall. On her concentric hypertrophy. EF 60-65%.  GR 1 DD. Ascending aorta/root 45-46 mm.    Current Medications: Outpatient Medications Prior to Visit  Medication Sig Dispense Refill  . amLODipine (NORVASC)  10 MG tablet Take 10 mg by mouth every evening.     Marland Kitchen aspirin 81 MG chewable tablet Chew 1 tablet (81 mg total) by mouth daily.    . clopidogrel (PLAVIX) 75 MG tablet Take 1 tablet (75 mg total) by mouth daily. 90 tablet 1  . glimepiride (AMARYL) 4 MG tablet Take 4 mg by mouth 2 (two) times daily with a meal.     . hydrALAZINE (APRESOLINE) 50 MG tablet Take 1 tablet (50 mg total) by mouth 2 (two) times daily. 180 tablet 2  . labetalol (NORMODYNE) 200 MG tablet Take 100 mg by mouth 2 (two) times daily.     Marland Kitchen losartan-hydrochlorothiazide (HYZAAR) 100-25 MG per tablet Take 1 tablet by mouth every morning.     . metFORMIN (GLUCOPHAGE) 1000 MG tablet Take 1 tablet (1,000 mg total) by mouth 2 (two) times daily.    . nitroGLYCERIN (NITROSTAT) 0.4 MG SL tablet Place 1 tablet (0.4 mg total) under the tongue every 5 (five) minutes as needed for chest pain (up to 3 doses). 25 tablet 3   No facility-administered medications prior to visit.      Allergies:   Atorvastatin; Crestor [rosuvastatin]; Other; Pravastatin; and Simvastatin   Social History   Socioeconomic History  . Marital status: Married    Spouse name: [name]  . Number of children: 1  . Years of education: Not on file   . Highest education level: Not on file  Occupational History    Employer: RETIRED  Social Needs  . Financial resource strain: Not on file  . Food insecurity:    Worry: Not on file    Inability: Not on file  . Transportation needs:    Medical: Not on file    Non-medical: Not on file  Tobacco Use  . Smoking status: Former Smoker    Packs/day: 0.33    Years: 10.00    Pack years: 3.30    Types: Cigarettes    Last attempt to quit: 1961    Years since quitting: 58.3  . Smokeless tobacco: Never Used  Substance and Sexual Activity  . Alcohol use: No  . Drug use: No  . Sexual activity: Never  Lifestyle  . Physical activity:    Days per week: Not on file    Minutes per session: Not on file  . Stress: Not on file  Relationships  . Social connections:    Talks on phone: Not on file    Gets together: Not on file    Attends religious service: Not on file    Active member of club or organization: Not on file    Attends meetings of clubs or organizations: Not on file    Relationship status: Not on file  Other Topics Concern  . Not on file  Social History Narrative   Married father of one.  Previously uses stationary bike routinely.  Now reduced due to other social stressors.   Quit smoking 50 years ago.  Does not drink alcohol     Family History:  The patient's family history includes Heart Problems (age of onset: 54) in his father.   ROS:   Please see the history of present illness.    ROS All other systems reviewed and are negative.   PHYSICAL EXAM:   VS:  BP (!) 162/76   Pulse (!) 54   Ht 6\' 1"  (1.854 m)   Wt 229 lb (103.9 kg)   BMI 30.21 kg/m    GEN: Well nourished,  well developed, in no acute distress  HEENT: normal  Neck: no JVD, carotid bruits, or masses Cardiac: RRR; no murmurs, rubs, or gallops.  2+ LE edema  Respiratory:  clear to auscultation bilaterally, normal work of breathing GI: soft, nontender, nondistended, + BS MS: no deformity or atrophy  Skin:  warm and dry, no rash Neuro:  Alert and Oriented x 3, Strength and sensation are intact Psych: euthymic mood, full affect  Wt Readings from Last 3 Encounters:  08/25/17 229 lb (103.9 kg)  05/06/17 217 lb 9.6 oz (98.7 kg)  02/25/17 215 lb (97.5 kg)      Studies/Labs Reviewed:   EKG:  EKG is ordered today.  The ekg ordered today demonstrates sinus bradycardia, heart rate 54, Q waves in the inferior leads, otherwise no significant ST-T wave changes  Recent Labs: 01/11/2017: ALT 7 02/25/2017: BUN 31; Creatinine, Ser 1.19; Hemoglobin 10.6; Platelets 372; Potassium 4.2; Sodium 138   Lipid Panel    Component Value Date/Time   CHOL 134 01/11/2017 0958   TRIG 83 01/11/2017 0958   HDL 40 01/11/2017 0958   CHOLHDL 3.4 01/11/2017 0958   CHOLHDL 3.2 03/26/2016 0531   VLDL 16 03/26/2016 0531   LDLCALC 77 01/11/2017 0958    Additional studies/ records that were reviewed today include:   Echo 03/26/2016 LV EF: 60% -   65%  Study Conclusions  - Left ventricle: Suggestive of hypokinesis of the distal inferior   wall but the apical 2 chamber view is somewhat forshortened. The   cavity size was normal. There was moderate focal basal and mild   concentric hypertrophy. Systolic function was normal. The   estimated ejection fraction was in the range of 60% to 65%. Wall   motion was normal; there were no regional wall motion   abnormalities. There was an increased relative contribution of   atrial contraction to ventricular filling. Doppler parameters are   consistent with abnormal left ventricular relaxation (grade 1   diastolic dysfunction). - Aortic valve: Trileaflet; normal thickness, mildly calcified   leaflets. There was trivial regurgitation. - Aorta: Aortic root dimension: 46 mm (ED). Ascending aortic   diameter: 45 mm (S). - Aortic root: The aortic root was moderately dilated. - Ascending aorta: The ascending aorta was moderately dilated. - Mitral valve: There was trivial  regurgitation. - Atrial septum: There was increased thickness of the septum,   consistent with lipomatous hypertrophy.   Cath 03/26/2016 Conclusion   Conclusions: 1. Significant native coronary artery disease, including 70% ostial LAD stenosis, 50% mid LCx lesion, and 99% proximal RCA disease with TIMI-1 flow (chronic per prior cath reports). 2. Patent LIMA to LAD. 3. Acutely occluded SVG to PDA within previously placed stent. 4. Successful PCI to proximal SVG to PDA in-stent restenosis/thrombosis with placement of a Promus Premier 3.0 x 16 mm drug eluting stent (dilated to 3.3 mm) with 0% residual stenosis and TIMI-3 flow. 5. Post-PCI, occlusion of the distal PDA was noted at the site of previous 70% stenosis.  IC verapamil was administered during the intervention to prevent no-reflow.  Tirofiban bolus was also administered.  Given that the patient was chest-pain free, further intervention was not performed.  Recommendations: 1. Transfer to cardiac ICU for post-STEMI care. 2. Transthoracic echocardiogram. 3. Dual antiplatelet therapy with ASA 81 mg daily and clopidogrel 75 mg daily for at least 12 months, ideally longer.      ASSESSMENT:    1. Unstable angina (HCC)   2. Abdominal aortic aneurysm (AAA)  without rupture ([place])   3. Coronary artery disease involving coronary bypass graft of native heart with angina pectoris ([place])   4. Essential hypertension   5. Hyperlipidemia, unspecified hyperlipidemia type   6. Controlled type 2 diabetes mellitus without complication, without long-term current use of insulin ([place])   7. PAF (paroxysmal atrial fibrillation) ([place])   8. Lower extremity edema      PLAN:  In order of problems listed above:  6. Progressive angina: His recent symptom is concerning for progressive angina, we discussed various options including stress test versus cardiac catheterization.  I also discussed the case with Dr. Ellyn Hack, our recommendation is to proceed with  cardiac catheterization.  - Risk and benefit of procedure explained to the patient who display clear understanding and agree to proceed. Discussed with patient possible procedural risk include bleeding, vascular injury, renal injury, arrythmia, MI, stroke and loss of limb or life.  We will hold metformin for 24 hours prior to cath and 48 hours after cath.   7. CAD status post CABG: After bypass surgery in 2012, he has underwent repeat PCI to SVG to PDA twice in 2016 and the 2017.  8. Lower extremity edema: He has at least 2+ pitting edema in bilateral lower extremity, will need Lasix 20 mg on a as needed basis.  Pending LV gram during cardiac catheterization to assess ejection fraction.  There is no heart murmur on physical exam.  If EF is low, then we will obtain echocardiogram.  9. AAA: He is due for AAA ultrasound.  Previous AAA was measured 4.5 cm in 2017  10. Hypertension: Blood pressure was elevated yesterday, will add 30 mg daily of Imdur.  11. Hyperlipidemia: He is intolerant to multiple statins, depend on cardiac catheterization finding, we need to consider zetia PCSK 9 inhibitor  12. DM 2: Hold metformin for 24 hours prior to cath and 48 hours after cath.  13. History of postoperative atrial fibrillation: No recurrence, no plan to start on systemic anticoagulation unless atrial fibrillation does recur.    Medication Adjustments/Labs and Tests Ordered: Current medicines are reviewed at length with the patient today.  Concerns regarding medicines are outlined above.  Medication changes, Labs and Tests ordered today are listed in the Patient Instructions below. Patient Instructions  Medication Instructions:  START Imdur 30mg  take 1 tablet once a day START Lasix 20mg  take 1 tablet daily as needed for swelling  Labwork: Your physician recommends that you return for lab work in: TODAY-PTT, PTINR, BMP, CBC  Testing/Procedures: Your physician has requested that you have a cardiac  catheterization. Cardiac catheterization is used to diagnose and/or treat various heart conditions. Doctors may recommend this procedure for a number of different reasons. The most common reason is to evaluate chest pain. Chest pain can be a symptom of coronary artery disease (CAD), and cardiac catheterization can show whether plaque is narrowing or blocking your heart's arteries. This procedure is also used to evaluate the valves, as well as measure the blood flow and oxygen levels in different parts of your heart. For further information please visit [email or web address]. Please follow instruction sheet, as given.  INSTRUCTIONS BELOW SCHEDULED FOR 08/27/2017   Your physician has requested that you have an abdominal aorta duplex. During this test, an ultrasound is used to evaluate the aorta. Allow 30 minutes for this exam. Do not eat after midnight the day before and avoid carbonated beverages.  Follow-Up: Your physician recommends that you schedule a follow-up appointment in: 2 WEEKS AFTER CATH  with DR HARDING OR Darrin Apodaca, PA  Any Other Special Instructions Will Be Listed Below (If Applicable).  If you need a refill on your cardiac medications before your next appointment, please call your pharmacy.     [facility] [address] [place] [address] Dept: [phone] Loc: [place]  08/25/2017  You are scheduled for a Cardiac Catheterization on Friday, April 12 with Dr. Harrell Gave End.  1. Please arrive at the [facility] (Main Entrance [facility] at [facility]: [place], [place] [address] at 8:00 AM (two hours before your procedure to ensure your preparation). Free valet parking service is available.   Special note: Every effort is made to have your procedure done on time. Please understand that emergencies sometimes delay scheduled procedures.  2. Diet: Do not  eat or drink anything after midnight prior to your procedure except sips of water to take medications.  3. Labs: TODAY IN OFFICE  4. Medication instructions in preparation for your procedure: 1. HOLD METFORMIN 24 HOURS BEFORE CATH AND 48 HOURS AFTER CATH  *For reference purposes while preparing patient instructions.   Delete this med list prior to printing instructions for patient.*  5. Plan for one night stay--bring personal belongings. 6. Bring a current list of your medications and current insurance cards. 7. You MUST have a responsible person to drive you home. 8. Someone MUST be with you the first 24 hours after you arrive home or your discharge will be delayed. 9. Please wear clothes that are easy to get on and off and wear slip-on shoes.  Thank you for allowing [place] to care for you!   -- [facility]      Signed, Almyra Deforest, [place]  08/25/2017 1:49 PM    [facility] [address], [place], [place]  [address] Phone: [phone]; Fax: [phone]

## 2017-08-25 NOTE — Progress Notes (Signed)
Cardiology Office Note    Date:  08/25/2017   ID:  [name], DOB 05-Aug-1930, MRN [id number]  PCP:  Suzan Garibaldi, FNP  Cardiologist:  Dr. Ellyn Hack  Chief Complaint  Patient presents with  . Follow-up    seen for Dr. Ellyn Hack. Eval for chest pain    History of Present Illness:  [name] is a 84 y.o. male with PMH of AAA, CAD s/p CABG (LIMA to LAD, SVG to PDA) 12/15/2010 by Dr. Servando Snare, HTN, HLD, DM II, post op afib (after CABG, no recurrence), and OSA not on CPAP. He is a Restaurant manager, fast food.  He was admitted for NSTEMI in January 2016 and underwent DES to SVG and RPDA.  He was admitted for STEMI in November 2017, cardiac catheterization obtained on 03/26/2016 showed significant native artery disease was chronic 99% proximal RCA lesion, 70% ostial LAD lesion, patent LIMA to LAD, acutely in-stent restenosis/thrombosis of SVG to PDA treated with 3.0 x 16 mm DES, post PCI, occlusion of the distal PDA was noted at the site of previous 70% stenosis.  Echocardiogram obtained on the same day showed EF 60-65%, grade 1 DD, ascending aortic diameter 45 mm.  He is last office visit with Dr. Ellyn Hack was in December 2018.  Patient presents today for evaluation of chest pain.  He used to have chest pain mainly when he does things and they would quickly go away with resting.  Since yesterday, he started having chest pain at rest lasting up to a minute each time and multiple times throughout the day.  He also developed lower extremity edema for the past 6 weeks.  I will placed him on a as needed dose of 20 mg Lasix.  For his anginal symptom, it is concerning for progressive angina, I will add on Imdur 30 mg daily.  I have discussed the case with Dr. Ellyn Hack, our recommendation is to proceed with cardiac catheterization.  I have discussed with the patient benefit and risk of the procedure, he was agreeable to proceed.  He is also due for abdominal ultrasound for his AAA.  We will refill his nitroglycerin.   He is aware that he needs to take nitroglycerin if chest pain recurs.   Past Medical History:  Diagnosis Date  . Abdominal aortic aneurysm ([place])    a. Aortic duplex 06/2014: mild aneurysmal dilatation of proximal abdominal aorta measuring 3.4x3.4cm. No sig change from 2012. F/u due 06/2016;   . AKI (acute kidney injury) ([place]) 03/28/2016  . Arthritis    "right knee; never bothered me" (03/26/2016)  . 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 CAD in native artery    a. NSTEMI 11/2010 - CABG x2(LIMA to LAD, SVG to PDA). b. NEG Lexi MV 10/24/13, EF 53%, no perfusion abnormality, septal and apical HK noted. c. NSTEMI 05/2014 - s/p DES to SVG-RPDA 06/18/14 (Xience Alpine DES 3.0 x 18 mm -3.35 mm), EF 60-65; d. 03/2016 STEMI/PCI: LM nl, ost LAD 70%, mLCx 50%, pRCA 95% - mRCA 60%, dRPDA 70%, LIMA->LAD ok, o-p SVG->RPDA 100% (3.0x16 Promus DES overlapping prior stent).  . Diastolic dysfunction    a. 03/2016 Echo: EF 60-65%, no rwma, Gr1 DD, triv AI, Ao root 79mm, Asc Ao 33mm, triv MR.  . Dizziness    a. Carotid duplex 03/2014: mild fibrous plaque, no significant stenosis.  . Foley catheter in place    "been wearing it for a couple months now" (03/26/2016)  . HTN (  hypertension)   . Hyperlipidemia LDL goal <70   . Non-STEMI (non-ST elevated myocardial infarction) ([name]) 11/2010   1. Ostial LAD 70% (to close to [facility] for PCI), subtotal occlusion of the RCA  . Postoperative atrial fibrillation ([place]) 11/2010   Post CABG, no sign recurrence  . Refusal of blood transfusions as patient is Jehovah's Witness   . 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 Servando Snare)  . Sleep apnea    Not on CPAP. (03/26/2016)  . ST elevation myocardial infarction (STEMI) of inferior wall ([place]) 04/05/2016   Occluded in-stent restenosis/thrombosis of SVG-RCA --> treated with overlapping Promus DES 3.0 mm 16 mm postdilated 3.0 mm).  . Stroke [name]) 2014; 01/2015   a. 2014 with mild right hand  weakness, nonhemorrhagic per pt.;; b. - PTA-Stent L ICA 95%   . Type II diabetes mellitus ([place])     Past Surgical History:  Procedure Laterality Date  . APPENDECTOMY    . CARDIAC CATHETERIZATION  12/11/2010   Dr. Chase Picket - subsequent cath - normal LV systolic function, no renal artery stenosis, severe 2-vessel disease with subtotaled RCA prox and distal 60% lesion and complex 70% area of narrowing of ostium of LAD  . CARDIAC CATHETERIZATION N/A 03/26/2016   Procedure: Left Heart Cath and Coronary Angiography;  Surgeon: Nelva Bush, MD;  Location: [place] CV LAB;  Service: Cardiovascular;  Laterality: N/A;  . CARDIAC CATHETERIZATION N/A 03/26/2016   Procedure: Coronary Stent Intervention;  Surgeon: Nelva Bush, MD;  Location: [place] CV LAB;  Service: Cardiovascular: 100% In-stent thrombosis of pros SVG-RCA (Xience DES) --> treated with PromusDES 3.0 x 18 (3.3 mm)  . CARDIAC CATHETERIZATION N/A 03/26/2016   Procedure: Bypass Graft Angiography;  Surgeon: Nelva Bush, MD;  Location: [place] CV LAB;  Service: Cardiovascular;  Laterality: N/A;  . CORONARY ANGIOPLASTY WITH STENT PLACEMENT  06/18/2014   PCI to SVG-RPDA 06/18/14 (Xience Alpine DES 3.0 x 18 mm -3.35 mm),   . CORONARY ARTERY BYPASS GRAFT  12/15/2010   X2, Dr Servando Snare; LIMA to LAD, SVG to PDA;   . CYSTOSCOPY WITH URETHRAL DILATATION    . IR ANGIO INTRA EXTRACRAN SEL COM CAROTID INNOMINATE BILAT MOD SED  02/25/2017  . IR ANGIO VERTEBRAL SEL SUBCLAVIAN INNOMINATE UNI L MOD SED  02/25/2017  . IR GENERIC HISTORICAL  01/21/2016   IR RADIOLOGIST EVAL & MGMT 01/21/2016 [facility]-INTERV RAD  . IR GENERIC HISTORICAL  02/03/2016   IR CATHETER TUBE CHANGE 02/03/2016 Marybelle Killings, MD [facility]-INTERV RAD  . LEFT HEART CATHETERIZATION WITH CORONARY ANGIOGRAM N/A 06/18/2014   Procedure: LEFT HEART CATHETERIZATION WITH CORONARY ANGIOGRAM;  Surgeon: Leonie Man, MD;  Location: [facility] CATH LAB;  Service: Cardiovascular;  -- severe disease of SVG-rPDA    . RADIOLOGY WITH ANESTHESIA N/A 01/24/2015   Procedure: STENT ASSISTED ANGIOPLASTY (RADIOLOGY WITH ANESTHESIA);  Surgeon: Luanne Bras, MD;  Location: [place];  Service: Radiology;  Laterality: N/A;  . TONSILLECTOMY    . TRANSTHORACIC ECHOCARDIOGRAM  07/28/2012; 03/25/2015:   a. EF 55-60%, severe Conc LVH, Nl Systolic fxn, G1 DD,  Ao Sclerosis w/o stenosis; CRO PFO;; b. Moderate concentric LVH. EF 60-65%. No RWMA, Gr 1 DD. Mild LA dilation  . TRANSTHORACIC ECHOCARDIOGRAM  03/2016   Hypokinesis of the distal inferior wall. On her concentric hypertrophy. EF 60-65%.  GR 1 DD. Ascending aorta/root 45-46 mm.    Current Medications: Outpatient Medications Prior to Visit  Medication Sig Dispense Refill  . amLODipine (NORVASC)  10 MG tablet Take 10 mg by mouth every evening.     Marland Kitchen aspirin 81 MG chewable tablet Chew 1 tablet (81 mg total) by mouth daily.    . clopidogrel (PLAVIX) 75 MG tablet Take 1 tablet (75 mg total) by mouth daily. 90 tablet 1  . glimepiride (AMARYL) 4 MG tablet Take 4 mg by mouth 2 (two) times daily with a meal.     . hydrALAZINE (APRESOLINE) 50 MG tablet Take 1 tablet (50 mg total) by mouth 2 (two) times daily. 180 tablet 2  . labetalol (NORMODYNE) 200 MG tablet Take 100 mg by mouth 2 (two) times daily.     Marland Kitchen losartan-hydrochlorothiazide (HYZAAR) 100-25 MG per tablet Take 1 tablet by mouth every morning.     . metFORMIN (GLUCOPHAGE) 1000 MG tablet Take 1 tablet (1,000 mg total) by mouth 2 (two) times daily.    . nitroGLYCERIN (NITROSTAT) 0.4 MG SL tablet Place 1 tablet (0.4 mg total) under the tongue every 5 (five) minutes as needed for chest pain (up to 3 doses). 25 tablet 3   No facility-administered medications prior to visit.      Allergies:   Atorvastatin; Crestor [rosuvastatin]; Other; Pravastatin; and Simvastatin   Social History   Socioeconomic History  . Marital status: Married    Spouse name: [name]  . Number of children: 1  . Years of education: Not on file   . Highest education level: Not on file  Occupational History    Employer: RETIRED  Social Needs  . Financial resource strain: Not on file  . Food insecurity:    Worry: Not on file    Inability: Not on file  . Transportation needs:    Medical: Not on file    Non-medical: Not on file  Tobacco Use  . Smoking status: Former Smoker    Packs/day: 0.33    Years: 10.00    Pack years: 3.30    Types: Cigarettes    Last attempt to quit: 1961    Years since quitting: 58.3  . Smokeless tobacco: Never Used  Substance and Sexual Activity  . Alcohol use: No  . Drug use: No  . Sexual activity: Never  Lifestyle  . Physical activity:    Days per week: Not on file    Minutes per session: Not on file  . Stress: Not on file  Relationships  . Social connections:    Talks on phone: Not on file    Gets together: Not on file    Attends religious service: Not on file    Active member of club or organization: Not on file    Attends meetings of clubs or organizations: Not on file    Relationship status: Not on file  Other Topics Concern  . Not on file  Social History Narrative   Married father of one.  Previously uses stationary bike routinely.  Now reduced due to other social stressors.   Quit smoking 50 years ago.  Does not drink alcohol     Family History:  The patient's family history includes Heart Problems (age of onset: 1) in his father.   ROS:   Please see the history of present illness.    ROS All other systems reviewed and are negative.   PHYSICAL EXAM:   VS:  BP (!) 162/76   Pulse (!) 54   Ht 6\' 1"  (1.854 m)   Wt 229 lb (103.9 kg)   BMI 30.21 kg/m    GEN: Well nourished,  well developed, in no acute distress  HEENT: normal  Neck: no JVD, carotid bruits, or masses Cardiac: RRR; no murmurs, rubs, or gallops.  2+ LE edema  Respiratory:  clear to auscultation bilaterally, normal work of breathing GI: soft, nontender, nondistended, + BS MS: no deformity or atrophy  Skin:  warm and dry, no rash Neuro:  Alert and Oriented x 3, Strength and sensation are intact Psych: euthymic mood, full affect  Wt Readings from Last 3 Encounters:  08/25/17 229 lb (103.9 kg)  05/06/17 217 lb 9.6 oz (98.7 kg)  02/25/17 215 lb (97.5 kg)      Studies/Labs Reviewed:   EKG:  EKG is ordered today.  The ekg ordered today demonstrates sinus bradycardia, heart rate 54, Q waves in the inferior leads, otherwise no significant ST-T wave changes  Recent Labs: 01/11/2017: ALT 7 02/25/2017: BUN 31; Creatinine, Ser 1.19; Hemoglobin 10.6; Platelets 372; Potassium 4.2; Sodium 138   Lipid Panel    Component Value Date/Time   CHOL 134 01/11/2017 0958   TRIG 83 01/11/2017 0958   HDL 40 01/11/2017 0958   CHOLHDL 3.4 01/11/2017 0958   CHOLHDL 3.2 03/26/2016 0531   VLDL 16 03/26/2016 0531   LDLCALC 77 01/11/2017 0958    Additional studies/ records that were reviewed today include:   Echo 03/26/2016 LV EF: 60% -   65%  Study Conclusions  - Left ventricle: Suggestive of hypokinesis of the distal inferior   wall but the apical 2 chamber view is somewhat forshortened. The   cavity size was normal. There was moderate focal basal and mild   concentric hypertrophy. Systolic function was normal. The   estimated ejection fraction was in the range of 60% to 65%. Wall   motion was normal; there were no regional wall motion   abnormalities. There was an increased relative contribution of   atrial contraction to ventricular filling. Doppler parameters are   consistent with abnormal left ventricular relaxation (grade 1   diastolic dysfunction). - Aortic valve: Trileaflet; normal thickness, mildly calcified   leaflets. There was trivial regurgitation. - Aorta: Aortic root dimension: 46 mm (ED). Ascending aortic   diameter: 45 mm (S). - Aortic root: The aortic root was moderately dilated. - Ascending aorta: The ascending aorta was moderately dilated. - Mitral valve: There was trivial  regurgitation. - Atrial septum: There was increased thickness of the septum,   consistent with lipomatous hypertrophy.   Cath 03/26/2016 Conclusion   Conclusions: 1. Significant native coronary artery disease, including 70% ostial LAD stenosis, 50% mid LCx lesion, and 99% proximal RCA disease with TIMI-1 flow (chronic per prior cath reports). 2. Patent LIMA to LAD. 3. Acutely occluded SVG to PDA within previously placed stent. 4. Successful PCI to proximal SVG to PDA in-stent restenosis/thrombosis with placement of a Promus Premier 3.0 x 16 mm drug eluting stent (dilated to 3.3 mm) with 0% residual stenosis and TIMI-3 flow. 5. Post-PCI, occlusion of the distal PDA was noted at the site of previous 70% stenosis.  IC verapamil was administered during the intervention to prevent no-reflow.  Tirofiban bolus was also administered.  Given that the patient was chest-pain free, further intervention was not performed.  Recommendations: 1. Transfer to cardiac ICU for post-STEMI care. 2. Transthoracic echocardiogram. 3. Dual antiplatelet therapy with ASA 81 mg daily and clopidogrel 75 mg daily for at least 12 months, ideally longer.      ASSESSMENT:    1. Unstable angina (HCC)   2. Abdominal aortic aneurysm (AAA)  without rupture ([place])   3. Coronary artery disease involving coronary bypass graft of native heart with angina pectoris ([place])   4. Essential hypertension   5. Hyperlipidemia, unspecified hyperlipidemia type   6. Controlled type 2 diabetes mellitus without complication, without long-term current use of insulin ([place])   7. PAF (paroxysmal atrial fibrillation) ([place])   8. Lower extremity edema      PLAN:  In order of problems listed above:  6. Progressive angina: His recent symptom is concerning for progressive angina, we discussed various options including stress test versus cardiac catheterization.  I also discussed the case with Dr. Ellyn Hack, our recommendation is to proceed with  cardiac catheterization.  - Risk and benefit of procedure explained to the patient who display clear understanding and agree to proceed. Discussed with patient possible procedural risk include bleeding, vascular injury, renal injury, arrythmia, MI, stroke and loss of limb or life.  We will hold metformin for 24 hours prior to cath and 48 hours after cath.   7. CAD status post CABG: After bypass surgery in 2012, he has underwent repeat PCI to SVG to PDA twice in 2016 and the 2017.  8. Lower extremity edema: He has at least 2+ pitting edema in bilateral lower extremity, will need Lasix 20 mg on a as needed basis.  Pending LV gram during cardiac catheterization to assess ejection fraction.  There is no heart murmur on physical exam.  If EF is low, then we will obtain echocardiogram.  9. AAA: He is due for AAA ultrasound.  Previous AAA was measured 4.5 cm in 2017  10. Hypertension: Blood pressure was elevated yesterday, will add 30 mg daily of Imdur.  11. Hyperlipidemia: He is intolerant to multiple statins, depend on cardiac catheterization finding, we need to consider zetia PCSK 9 inhibitor  12. DM 2: Hold metformin for 24 hours prior to cath and 48 hours after cath.  13. History of postoperative atrial fibrillation: No recurrence, no plan to start on systemic anticoagulation unless atrial fibrillation does recur.    Medication Adjustments/Labs and Tests Ordered: Current medicines are reviewed at length with the patient today.  Concerns regarding medicines are outlined above.  Medication changes, Labs and Tests ordered today are listed in the Patient Instructions below. Patient Instructions  Medication Instructions:  START Imdur 30mg  take 1 tablet once a day START Lasix 20mg  take 1 tablet daily as needed for swelling  Labwork: Your physician recommends that you return for lab work in: TODAY-PTT, PTINR, BMP, CBC  Testing/Procedures: Your physician has requested that you have a cardiac  catheterization. Cardiac catheterization is used to diagnose and/or treat various heart conditions. Doctors may recommend this procedure for a number of different reasons. The most common reason is to evaluate chest pain. Chest pain can be a symptom of coronary artery disease (CAD), and cardiac catheterization can show whether plaque is narrowing or blocking your heart's arteries. This procedure is also used to evaluate the valves, as well as measure the blood flow and oxygen levels in different parts of your heart. For further information please visit HugeFiesta.tn. Please follow instruction sheet, as given.  INSTRUCTIONS BELOW SCHEDULED FOR 08/27/2017   Your physician has requested that you have an abdominal aorta duplex. During this test, an ultrasound is used to evaluate the aorta. Allow 30 minutes for this exam. Do not eat after midnight the day before and avoid carbonated beverages.  Follow-Up: Your physician recommends that you schedule a follow-up appointment in: 2 WEEKS AFTER CATH  with DR HARDING OR Jariya Reichow, PA  Any Other Special Instructions Will Be Listed Below (If Applicable).  If you need a refill on your cardiac medications before your next appointment, please call your pharmacy.     [place] [address] [place] [address] Dept: [phone] Loc: [place]  08/25/2017  You are scheduled for a Cardiac Catheterization on Friday, April 12 with Dr. Harrell Gave End.  1. Please arrive at the [facility] (Main Entrance A) at [facility]: [place], [place] [address] at 8:00 AM (two hours before your procedure to ensure your preparation). Free valet parking service is available.   Special note: Every effort is made to have your procedure done on time. Please understand that emergencies sometimes delay scheduled procedures.  2. Diet: Do not  eat or drink anything after midnight prior to your procedure except sips of water to take medications.  3. Labs: TODAY IN OFFICE  4. Medication instructions in preparation for your procedure: 1. HOLD METFORMIN 24 HOURS BEFORE CATH AND 48 HOURS AFTER CATH  *For reference purposes while preparing patient instructions.   Delete this med list prior to printing instructions for patient.*  5. Plan for one night stay--bring personal belongings. 6. Bring a current list of your medications and current insurance cards. 7. You MUST have a responsible person to drive you home. 8. Someone MUST be with you the first 24 hours after you arrive home or your discharge will be delayed. 9. Please wear clothes that are easy to get on and off and wear slip-on shoes.  Thank you for allowing [place] to care for you!   -- [facility] Invasive Cardiovascular services      Signed, Almyra Deforest, [place]  08/25/2017 1:49 PM    [facility] [place], [place], [place]  [address] Phone: [phone]; Fax: [phone]

## 2017-08-25 NOTE — Patient Instructions (Addendum)
Medication Instructions:  START Imdur 30mg  take 1 tablet once a day START Lasix 20mg  take 1 tablet daily as needed for swelling  Labwork: Your physician recommends that you return for lab work in: TODAY-PTT, PTINR, BMP, CBC  Testing/Procedures: Your physician has requested that you have a cardiac catheterization. Cardiac catheterization is used to diagnose and/or treat various heart conditions. Doctors may recommend this procedure for a number of different reasons. The most common reason is to evaluate chest pain. Chest pain can be a symptom of coronary artery disease (CAD), and cardiac catheterization can show whether plaque is narrowing or blocking your heart's arteries. This procedure is also used to evaluate the valves, as well as measure the blood flow and oxygen levels in different parts of your heart. For further information please visit HugeFiesta.tn. Please follow instruction sheet, as given.  INSTRUCTIONS BELOW SCHEDULED FOR 08/27/2017   Your physician has requested that you have an abdominal aorta duplex. During this test, an ultrasound is used to evaluate the aorta. Allow 30 minutes for this exam. Do not eat after midnight the day before and avoid carbonated beverages.  Follow-Up: Your physician recommends that you schedule a follow-up appointment in: 2 WEEKS AFTER CATH with DR HARDING OR HAO MENG, PA  Any Other Special Instructions Will Be Listed Below (If Applicable).  If you need a refill on your cardiac medications before your next appointment, please call your pharmacy.     [place] [facility] [address] [place] [address] Dept: [phone] Loc: [place]  08/25/2017  You are scheduled for a Cardiac Catheterization on Friday, April 12 with Dr. Harrell Gave End.  1. Please arrive at the [facility] (Main Entrance A) at [facility]: [place], [place] [address] at 8:00 AM (two hours before your procedure to ensure your preparation). Free valet parking service is available.   Special note: Every effort is made to have your procedure done on time. Please understand that emergencies sometimes delay scheduled procedures.  2. Diet: Do not eat or drink anything after midnight prior to your procedure except sips of water to take medications.  3. Labs: TODAY IN OFFICE  4. Medication instructions in preparation for your procedure: 1. HOLD METFORMIN 24 HOURS BEFORE CATH AND 48 HOURS AFTER CATH  *For reference purposes while preparing patient instructions.   Delete this med list prior to printing instructions for patient.*  5. Plan for one night stay--bring personal belongings. 6. Bring a current list of your medications and current insurance cards. 7. You MUST have a responsible person to drive you home. 8. Someone MUST be with you the first 24 hours after you arrive home or your discharge will be delayed. 9. Please wear clothes that are easy to get on and off and wear slip-on shoes.  Thank you for allowing [place] to care for you!   -- [place] Invasive Cardiovascular services

## 2017-08-26 ENCOUNTER — Other Ambulatory Visit: Payer: Self-pay | Admitting: Physician Assistant

## 2017-08-26 ENCOUNTER — Telehealth: Payer: Self-pay | Admitting: *Deleted

## 2017-08-26 ENCOUNTER — Other Ambulatory Visit (HOSPITAL_COMMUNITY): Payer: Self-pay | Admitting: Interventional Radiology

## 2017-08-26 DIAGNOSIS — I771 Stricture of artery: Secondary | ICD-10-CM

## 2017-08-26 NOTE — Telephone Encounter (Signed)
Pt contacted pre-catheterization scheduled at [facility] for: August 27, 2017 10 :30 AM Verified arrival time and place: [place] at: 8 AM Nothing to eat after midnight prior to cath. Clear liquids until 5 AM day of procedure.  Hold: Metformin 08/26/17, 08/27/17 and 48 hours post cath. Glimepiride AM of cath Losartan/HCTZ AM of cath Furosemide AM of cath    Except hold medications AM meds can be  taken pre-cath with sip of water including: ASA 81 mg Clopidogrel 75 mg  Confirmed patient has responsible person to drive home post procedure and observe patient for 24 hours: yes

## 2017-08-27 ENCOUNTER — Ambulatory Visit (HOSPITAL_COMMUNITY)
Admission: RE | Admit: 2017-08-27 | Discharge: 2017-08-28 | Disposition: A | Discharge status: Home / Self Care | Payer: Medicare Other | Source: Ambulatory Visit | Attending: Internal Medicine | Admitting: Internal Medicine

## 2017-08-27 ENCOUNTER — Encounter (HOSPITAL_COMMUNITY)
Admission: RE | Disposition: A | Discharge status: Home / Self Care | Payer: Self-pay | Source: Ambulatory Visit | Attending: Internal Medicine

## 2017-08-27 ENCOUNTER — Ambulatory Visit (HOSPITAL_COMMUNITY): Payer: Medicare Other

## 2017-08-27 DIAGNOSIS — Z7982 Long term (current) use of aspirin: Secondary | ICD-10-CM | POA: Diagnosis not present

## 2017-08-27 DIAGNOSIS — I714 Abdominal aortic aneurysm, without rupture, unspecified: Secondary | ICD-10-CM | POA: Diagnosis present

## 2017-08-27 DIAGNOSIS — Z7984 Long term (current) use of oral hypoglycemic drugs: Secondary | ICD-10-CM | POA: Diagnosis not present

## 2017-08-27 DIAGNOSIS — I69331 Monoplegia of upper limb following cerebral infarction affecting right dominant side: Secondary | ICD-10-CM | POA: Diagnosis not present

## 2017-08-27 DIAGNOSIS — M1990 Unspecified osteoarthritis, unspecified site: Secondary | ICD-10-CM | POA: Insufficient documentation

## 2017-08-27 DIAGNOSIS — I25110 Atherosclerotic heart disease of native coronary artery with unstable angina pectoris: Secondary | ICD-10-CM

## 2017-08-27 DIAGNOSIS — R600 Localized edema: Secondary | ICD-10-CM | POA: Diagnosis not present

## 2017-08-27 DIAGNOSIS — E785 Hyperlipidemia, unspecified: Secondary | ICD-10-CM | POA: Diagnosis not present

## 2017-08-27 DIAGNOSIS — Z951 Presence of aortocoronary bypass graft: Secondary | ICD-10-CM | POA: Diagnosis not present

## 2017-08-27 DIAGNOSIS — I252 Old myocardial infarction: Secondary | ICD-10-CM | POA: Diagnosis not present

## 2017-08-27 DIAGNOSIS — Z955 Presence of coronary angioplasty implant and graft: Secondary | ICD-10-CM | POA: Diagnosis not present

## 2017-08-27 DIAGNOSIS — E1169 Type 2 diabetes mellitus with other specified complication: Secondary | ICD-10-CM | POA: Diagnosis not present

## 2017-08-27 DIAGNOSIS — I10 Essential (primary) hypertension: Secondary | ICD-10-CM | POA: Diagnosis not present

## 2017-08-27 DIAGNOSIS — I200 Unstable angina: Secondary | ICD-10-CM | POA: Diagnosis present

## 2017-08-27 DIAGNOSIS — G4733 Obstructive sleep apnea (adult) (pediatric): Secondary | ICD-10-CM | POA: Insufficient documentation

## 2017-08-27 DIAGNOSIS — I2582 Chronic total occlusion of coronary artery: Secondary | ICD-10-CM | POA: Insufficient documentation

## 2017-08-27 DIAGNOSIS — I480 Paroxysmal atrial fibrillation: Secondary | ICD-10-CM | POA: Insufficient documentation

## 2017-08-27 DIAGNOSIS — Z87891 Personal history of nicotine dependence: Secondary | ICD-10-CM | POA: Diagnosis not present

## 2017-08-27 DIAGNOSIS — Z7902 Long term (current) use of antithrombotics/antiplatelets: Secondary | ICD-10-CM | POA: Diagnosis not present

## 2017-08-27 DIAGNOSIS — I25119 Atherosclerotic heart disease of native coronary artery with unspecified angina pectoris: Secondary | ICD-10-CM | POA: Diagnosis present

## 2017-08-27 HISTORY — PX: CORONARY/GRAFT ANGIOGRAPHY: CATH118237

## 2017-08-27 LAB — GLUCOSE, CAPILLARY
GLUCOSE-CAPILLARY: 104 mg/dL — AB (ref 65–99)
GLUCOSE-CAPILLARY: 158 mg/dL — AB (ref 65–99)
GLUCOSE-CAPILLARY: 162 mg/dL — AB (ref 65–99)
Glucose-Capillary: 97 mg/dL (ref 65–99)

## 2017-08-27 SURGERY — CORONARY/GRAFT ANGIOGRAPHY
Anesthesia: LOCAL

## 2017-08-27 MED ORDER — SODIUM CHLORIDE 0.9 % IV SOLN: 250.0000 mL | INTRAVENOUS | Status: DC | PRN

## 2017-08-27 MED ORDER — HEPARIN (PORCINE) IN NACL 2-0.9 UNIT/ML-% IJ SOLN
INTRAMUSCULAR | Status: AC
  Filled 2017-08-27: qty 1000

## 2017-08-27 MED ORDER — ISOSORBIDE MONONITRATE ER 60 MG PO TB24
60.0000 mg | ORAL_TABLET | Freq: Every day | ORAL | Status: DC
  Administered 2017-08-27: 19:00:00 60 mg via ORAL
  Filled 2017-08-27 (×3): qty 1

## 2017-08-27 MED ORDER — VERAPAMIL HCL 2.5 MG/ML IV SOLN
INTRAVENOUS | Status: AC
  Filled 2017-08-27: qty 2

## 2017-08-27 MED ORDER — HEPARIN (PORCINE) IN NACL 2-0.9 UNIT/ML-% IJ SOLN
INTRAMUSCULAR | Status: AC | PRN
  Administered 2017-08-27 (×2): 500 mL

## 2017-08-27 MED ORDER — NITROGLYCERIN 0.4 MG SL SUBL: 0.4000 mg | SUBLINGUAL_TABLET | SUBLINGUAL | Status: DC | PRN

## 2017-08-27 MED ORDER — LIDOCAINE HCL (PF) 1 % IJ SOLN
INTRAMUSCULAR | Status: DC | PRN
  Administered 2017-08-27: 2 mL

## 2017-08-27 MED ORDER — SODIUM CHLORIDE 0.9 % WEIGHT BASED INFUSION: 3.0000 mL/kg/h | INTRAVENOUS | Status: DC

## 2017-08-27 MED ORDER — SODIUM CHLORIDE 0.9% FLUSH: 3.0000 mL | INTRAVENOUS | Status: DC | PRN

## 2017-08-27 MED ORDER — ACETAMINOPHEN 325 MG PO TABS: 650.0000 mg | ORAL_TABLET | ORAL | Status: DC | PRN

## 2017-08-27 MED ORDER — SODIUM CHLORIDE 0.9% FLUSH
3.0000 mL | Freq: Two times a day (BID) | INTRAVENOUS | Status: DC
Start: 2017-08-27 — End: 2017-08-28
  Administered 2017-08-27 (×2): 3 mL via INTRAVENOUS

## 2017-08-27 MED ORDER — SODIUM CHLORIDE 0.9% FLUSH: 3.0000 mL | Freq: Two times a day (BID) | INTRAVENOUS | Status: DC

## 2017-08-27 MED ORDER — ASPIRIN 81 MG PO CHEW: 81.0000 mg | CHEWABLE_TABLET | ORAL | Status: DC

## 2017-08-27 MED ORDER — HYDROCHLOROTHIAZIDE 25 MG PO TABS
25.0000 mg | ORAL_TABLET | Freq: Every day | ORAL | Status: DC
  Administered 2017-08-27: 11:00:00 25 mg via ORAL
  Filled 2017-08-27 (×2): qty 1

## 2017-08-27 MED ORDER — HEPARIN SODIUM (PORCINE) 1000 UNIT/ML IJ SOLN
INTRAMUSCULAR | Status: DC | PRN
  Administered 2017-08-27: 5000 [IU] via INTRAVENOUS

## 2017-08-27 MED ORDER — VERAPAMIL HCL 2.5 MG/ML IV SOLN
INTRAVENOUS | Status: DC | PRN
  Administered 2017-08-27: 10 mL via INTRA_ARTERIAL

## 2017-08-27 MED ORDER — HEPARIN SODIUM (PORCINE) 1000 UNIT/ML IJ SOLN
INTRAMUSCULAR | Status: AC
  Filled 2017-08-27: qty 1

## 2017-08-27 MED ORDER — LIDOCAINE HCL (PF) 1 % IJ SOLN
INTRAMUSCULAR | Status: AC
  Filled 2017-08-27: qty 30

## 2017-08-27 MED ORDER — SODIUM CHLORIDE 0.9 % WEIGHT BASED INFUSION: 1.0000 mL/kg/h | INTRAVENOUS | Status: DC

## 2017-08-27 MED ORDER — HEPARIN SODIUM (PORCINE) 5000 UNIT/ML IJ SOLN
5000.0000 [IU] | Freq: Three times a day (TID) | INTRAMUSCULAR | Status: DC
  Administered 2017-08-27: 5000 [IU] via SUBCUTANEOUS
  Filled 2017-08-27: qty 1

## 2017-08-27 MED ORDER — LOSARTAN POTASSIUM 50 MG PO TABS
100.0000 mg | ORAL_TABLET | Freq: Every day | ORAL | Status: DC
  Administered 2017-08-27: 11:00:00 100 mg via ORAL
  Filled 2017-08-27 (×2): qty 2

## 2017-08-27 MED ORDER — ASPIRIN 81 MG PO CHEW: 81.0000 mg | CHEWABLE_TABLET | Freq: Every day | ORAL | Status: DC

## 2017-08-27 MED ORDER — LOSARTAN POTASSIUM-HCTZ 100-25 MG PO TABS: 1.0000 | ORAL_TABLET | Freq: Every morning | ORAL | Status: DC

## 2017-08-27 MED ORDER — CLOPIDOGREL BISULFATE 75 MG PO TABS
75.0000 mg | ORAL_TABLET | Freq: Every day | ORAL | Status: DC
  Filled 2017-08-27: qty 1

## 2017-08-27 MED ORDER — INSULIN ASPART 100 UNIT/ML ~~LOC~~ SOLN
0.0000 [IU] | Freq: Three times a day (TID) | SUBCUTANEOUS | Status: DC
  Administered 2017-08-27: 3 [IU] via SUBCUTANEOUS

## 2017-08-27 MED ORDER — INSULIN ASPART 100 UNIT/ML ~~LOC~~ SOLN: 0.0000 [IU] | Freq: Every day | SUBCUTANEOUS | Status: DC

## 2017-08-27 MED ORDER — HYDRALAZINE HCL 50 MG PO TABS
50.0000 mg | ORAL_TABLET | Freq: Three times a day (TID) | ORAL | Status: DC
  Administered 2017-08-27 – 2017-08-28 (×3): 50 mg via ORAL
  Filled 2017-08-27 (×4): qty 1

## 2017-08-27 MED ORDER — HYDRALAZINE HCL 20 MG/ML IJ SOLN
5.0000 mg | Freq: Four times a day (QID) | INTRAMUSCULAR | Status: DC | PRN
  Administered 2017-08-27 (×2): 5 mg via INTRAVENOUS
  Filled 2017-08-27 (×2): qty 1

## 2017-08-27 MED ORDER — AMLODIPINE BESYLATE 10 MG PO TABS
10.0000 mg | ORAL_TABLET | Freq: Every evening | ORAL | Status: DC
  Administered 2017-08-27: 18:00:00 10 mg via ORAL
  Filled 2017-08-27: qty 1

## 2017-08-27 MED ORDER — FENTANYL CITRATE (PF) 100 MCG/2ML IJ SOLN
INTRAMUSCULAR | Status: AC
  Filled 2017-08-27: qty 2

## 2017-08-27 MED ORDER — MIDAZOLAM HCL 2 MG/2ML IJ SOLN
INTRAMUSCULAR | Status: DC | PRN
  Administered 2017-08-27: 0.5 mg via INTRAVENOUS

## 2017-08-27 MED ORDER — LABETALOL HCL 200 MG PO TABS
200.0000 mg | ORAL_TABLET | Freq: Every evening | ORAL | Status: DC
  Administered 2017-08-27: 19:00:00 200 mg via ORAL
  Filled 2017-08-27 (×2): qty 1

## 2017-08-27 MED ORDER — SODIUM CHLORIDE 0.9 % IV SOLN
INTRAVENOUS | Status: AC
  Administered 2017-08-27: 11:00:00 via INTRAVENOUS

## 2017-08-27 MED ORDER — ONDANSETRON HCL 4 MG/2ML IJ SOLN: 4.0000 mg | Freq: Four times a day (QID) | INTRAMUSCULAR | Status: DC | PRN

## 2017-08-27 MED ORDER — FENTANYL CITRATE (PF) 100 MCG/2ML IJ SOLN
INTRAMUSCULAR | Status: DC | PRN
  Administered 2017-08-27: 25 ug via INTRAVENOUS

## 2017-08-27 MED ORDER — MIDAZOLAM HCL 2 MG/2ML IJ SOLN
INTRAMUSCULAR | Status: AC
  Filled 2017-08-27: qty 2

## 2017-08-27 MED ORDER — IOHEXOL 350 MG/ML SOLN
INTRAVENOUS | Status: DC | PRN
  Administered 2017-08-27: 100 mL via INTRAVENOUS

## 2017-08-27 SURGICAL SUPPLY — 12 items
BAND ZEPHYR COMPRESS 30 LONG (HEMOSTASIS) ×4 IMPLANT
CATH INFINITI 5 FR MPA2 (CATHETERS) ×2 IMPLANT
CATH INFINITI 5FR JL5 (CATHETERS) ×2 IMPLANT
CATH INFINITI 5FR MULTPACK ANG (CATHETERS) ×2 IMPLANT
GUIDEWIRE INQWIRE 1.5J.035X260 (WIRE) ×1 IMPLANT
INQWIRE 1.5J .035X260CM (WIRE) ×2
KIT HEART LEFT (KITS) ×2 IMPLANT
NEEDLE PERC 21GX4CM (NEEDLE) ×2 IMPLANT
PACK CARDIAC CATHETERIZATION (CUSTOM PROCEDURE TRAY) ×2 IMPLANT
SHEATH RAIN RADIAL 21G 6FR (SHEATH) ×2 IMPLANT
TRANSDUCER W/STOPCOCK (MISCELLANEOUS) ×2 IMPLANT
TUBING CIL FLEX 10 FLL-RA (TUBING) ×2 IMPLANT

## 2017-08-27 NOTE — Progress Notes (Signed)
TR BAND REMOVAL  LOCATION:  left radial  DEFLATED PER PROTOCOL:  Yes.    TIME BAND OFF / DRESSING APPLIED:   1400   SITE UPON ARRIVAL:   Level 0  SITE AFTER BAND REMOVAL:  Level 0  CIRCULATION SENSATION AND MOVEMENT:  Within Normal Limits  Yes.    COMMENTS:

## 2017-08-27 NOTE — Interval H&P Note (Signed)
History and Physical Interval Note:  08/27/2017 8:52 AM  [name]  has presented today for cardiac catheterization, with the diagnosis of CAD, unstable angina. The various methods of treatment have been discussed with the patient and family. After consideration of risks, benefits and other options for treatment, the patient has consented to  Procedure(s): LEFT HEART CATH AND CORS/GRAFTS ANGIOGRAPHY (N/A) as a surgical intervention .  The patient's history has been reviewed, patient examined, no change in status, stable for surgery.  I have reviewed the patient's chart and labs.  Questions were answered to the patient's satisfaction.    Cath Lab Visit (complete for each Cath Lab visit)  Clinical Evaluation Leading to the Procedure:   ACS: No.  Non-ACS:    Anginal Classification: CCS III  Anti-ischemic medical therapy: Maximal Therapy (2 or more classes of medications)  Non-Invasive Test Results: No non-invasive testing performed  Prior CABG: Previous CABG  [name]

## 2017-08-28 ENCOUNTER — Ambulatory Visit (HOSPITAL_BASED_OUTPATIENT_CLINIC_OR_DEPARTMENT_OTHER): Payer: Medicare Other

## 2017-08-28 ENCOUNTER — Other Ambulatory Visit: Payer: Self-pay

## 2017-08-28 ENCOUNTER — Encounter (HOSPITAL_COMMUNITY): Payer: Self-pay | Admitting: Cardiology

## 2017-08-28 DIAGNOSIS — I252 Old myocardial infarction: Secondary | ICD-10-CM | POA: Diagnosis not present

## 2017-08-28 DIAGNOSIS — E785 Hyperlipidemia, unspecified: Secondary | ICD-10-CM | POA: Diagnosis not present

## 2017-08-28 DIAGNOSIS — I25119 Atherosclerotic heart disease of native coronary artery with unspecified angina pectoris: Secondary | ICD-10-CM | POA: Diagnosis not present

## 2017-08-28 DIAGNOSIS — I200 Unstable angina: Secondary | ICD-10-CM | POA: Diagnosis not present

## 2017-08-28 DIAGNOSIS — I2582 Chronic total occlusion of coronary artery: Secondary | ICD-10-CM | POA: Diagnosis not present

## 2017-08-28 DIAGNOSIS — R079 Chest pain, unspecified: Secondary | ICD-10-CM

## 2017-08-28 DIAGNOSIS — I10 Essential (primary) hypertension: Secondary | ICD-10-CM | POA: Diagnosis not present

## 2017-08-28 DIAGNOSIS — E1169 Type 2 diabetes mellitus with other specified complication: Secondary | ICD-10-CM | POA: Diagnosis not present

## 2017-08-28 DIAGNOSIS — I25110 Atherosclerotic heart disease of native coronary artery with unstable angina pectoris: Secondary | ICD-10-CM | POA: Diagnosis not present

## 2017-08-28 LAB — CBC
HCT: 27.9 % — ABNORMAL LOW (ref 39.0–52.0)
Hemoglobin: 9.1 g/dL — ABNORMAL LOW (ref 13.0–17.0)
MCH: 29 pg (ref 26.0–34.0)
MCHC: 32.6 g/dL (ref 30.0–36.0)
MCV: 88.9 fL (ref 78.0–100.0)
PLATELETS: 259 10*3/uL (ref 150–400)
RBC: 3.14 MIL/uL — ABNORMAL LOW (ref 4.22–5.81)
RDW: 17.1 % — AB (ref 11.5–15.5)
WBC: 5 10*3/uL (ref 4.0–10.5)

## 2017-08-28 LAB — ECHOCARDIOGRAM COMPLETE
HEIGHTINCHES: 73 in
WEIGHTICAEL: 3784.86 [oz_av]

## 2017-08-28 LAB — BASIC METABOLIC PANEL
Anion gap: 8 (ref 5–15)
BUN: 27 mg/dL — ABNORMAL HIGH (ref 6–20)
CALCIUM: 8.4 mg/dL — AB (ref 8.9–10.3)
CO2: 23 mmol/L (ref 22–32)
CREATININE: 1.42 mg/dL — AB (ref 0.61–1.24)
Chloride: 106 mmol/L (ref 101–111)
GFR, EST AFRICAN AMERICAN: 51 mL/min — AB (ref >60)
GFR, EST NON AFRICAN AMERICAN: 44 mL/min — AB (ref >60)
Glucose, Bld: 120 mg/dL — ABNORMAL HIGH (ref 65–99)
Potassium: 4 mmol/L (ref 3.5–5.1)
SODIUM: 137 mmol/L (ref 135–145)

## 2017-08-28 LAB — GLUCOSE, CAPILLARY: GLUCOSE-CAPILLARY: 106 mg/dL — AB (ref 65–99)

## 2017-08-28 MED ORDER — ISOSORBIDE MONONITRATE ER 60 MG PO TB24: 60.0000 mg | ORAL_TABLET | Freq: Every day | ORAL | 3 refills | Status: DC

## 2017-08-28 MED ORDER — HYDRALAZINE HCL 50 MG PO TABS: 50.0000 mg | ORAL_TABLET | Freq: Three times a day (TID) | ORAL | 3 refills | Status: DC

## 2017-08-28 NOTE — Discharge Summary (Addendum)
Discharge Summary    Patient ID: [name],  MRN: [id number], DOB/AGE: 02-12-1931 84 y.o.  Admit date: 08/27/2017 Discharge date: 08/28/2017  Primary Care Provider: Suzan Garibaldi Primary Cardiologist: Glenetta Hew, MD   Discharge Diagnoses    Principal Problem:   Unstable angina [facility]) Active Problems:   Moderate essential hypertension   Hyperlipidemia associated with type 2 diabetes mellitus ([place])   Coronary artery disease involving native coronary artery of native heart with angina pectoris ([place])   Abdominal aortic aneurysm ([place])   History of Present Illness     DONNAVAN COVAULT is a 84 y.o. male with past medical history of CAD (s/p CABG in 2012 with LIMA-LAD and SVG-PDA, s/p NSTEMI in 05/2014 with DES to SVG-PDA and ISR of SVG-PDA in 03/2016 with DES placement), HTN, HLD, Type 2 DM, OSA, and post-operative atrial fibrillation (occurring after CABG with no known recurrence) who presented to the hospital on 08/27/2017 for planned cardiac catheterization.  He was recently evaluated by Almyra Deforest, PA-C on 08/25/2017 and reported episodes of chest pain occurring with activity and resolving with rest. Due to his new-onset symptoms and known CAD, a cardiac catheterization was recommended for definitive evaluation and he was started on Imdur 30mg  daily at the time of his visit.    Hospital Course     Consultants: None  His catheterization showed severe 3-vessel coronary artery disease with occlusion of the mid-RCA but LIMA-LAD was patent and SVG-PDA was patent with minimal ISR. Continued medical management was recommended with further titration of Imdur and Hydralazine.   The following morning, he denied any repeat episodes of chest pain and breathing was at baseline. Telemetry showed he had maintained NSR. Hgb was at 9.1 and creatinine was at 1.42 (baseline 1.2- 1.3). An echocardiogram was performed and showed a preserved EF of  60% to 65%, Grade 1 DD, Ascending aortic diameter: 43  mm (S), and trivial TI. Imdur was increased to 60mg  daily and Hydralazine to 50mg  BID. Will continue on ASA, Plavix, and BB therapy. Has been intolerant to multiple statins.   He was last examined by Dr. Rayann Heman and deemed stable for discharge. Cardiology follow-up has been arranged and he will need a repeat CBC and BMET at the time of his visit. He was discharged home in good condition.   _____________  Discharge Physical Examination and Vitals Blood pressure (!) 138/46, pulse 61, temperature 98.2 F (36.8 C), temperature source Oral, resp. rate 13, height 6\' 1"  (1.854 m), weight 236 lb 8.9 oz (107.3 kg), SpO2 98 %.  Filed Weights   08/27/17 0756 08/28/17 0300  Weight: 227 lb (103 kg) 236 lb 8.9 oz (107.3 kg)    Labs & Radiologic Studies     CBC Recent Labs    08/28/17 0209  WBC 5.0  HGB 9.1*  HCT 27.9*  MCV 88.9  PLT 932   Basic Metabolic Panel Recent Labs    08/28/17 0209  NA 137  K 4.0  CL 106  CO2 23  GLUCOSE 120*  BUN 27*  CREATININE 1.42*  CALCIUM 8.4*   Liver Function Tests No results for input(s): AST, ALT, ALKPHOS, BILITOT, PROT, ALBUMIN in the last 72 hours. No results for input(s): LIPASE, AMYLASE in the last 72 hours. Cardiac Enzymes No results for input(s): CKTOTAL, CKMB, CKMBINDEX, TROPONINI in the last 72 hours. BNP Invalid input(s): POCBNP D-Dimer No results for input(s): DDIMER in the last 72 hours. Hemoglobin A1C No results for input(s): HGBA1C in  the last 72 hours. Fasting Lipid Panel No results for input(s): CHOL, HDL, LDLCALC, TRIG, CHOLHDL, LDLDIRECT in the last 72 hours. Thyroid Function Tests No results for input(s): TSH, T4TOTAL, T3FREE, THYROIDAB in the last 72 hours.  Invalid input(s): FREET3  No results found.   Diagnostic Studies/Procedures     Cardiac Catheterization: 08/27/2017 Conclusions: 1. Severe 3-vessel coronary artery disease, as detailed below.  Mid RCA is now occluded.  Otherwise, there has been no significant  change since 03/2016. 2. Widely patent LIMA-LAD. 3. Patent SVG-rPDA. Patent SVG overlapping stents have minimal in-stent restenosis. 4. Inability to cross aortic valve from the left radial. 5. Systemic hypertension  Recommendations: 1. Escalate medical therapy; will increase isosorbide mononitrate and hydralazine. 2. Obtain echocardiogram to assess LVEF. 3. Admit for overnight extended recovery, as the patient does not have a caregiver to stay with him overnight.  Echocardiogram: 08/28/2017 Study Conclusions  - Left ventricle: The cavity size was normal. Wall thickness was   increased in a pattern of mild LVH. Systolic function was normal.   The estimated ejection fraction was in the range of 60% to 65%.   Wall motion was normal; there were no regional wall motion   abnormalities. Doppler parameters are consistent with abnormal   left ventricular relaxation (grade 1 diastolic dysfunction).   Doppler parameters are consistent with indeterminate ventricular   filling pressure. - Aortic valve: Transvalvular velocity was within the normal range.   There was no stenosis. There was no regurgitation. - Aorta: Ascending aortic diameter: 43 mm (S). - Ascending aorta: The ascending aorta was mildly dilated. - Mitral valve: Transvalvular velocity was within the normal range.   There was no evidence for stenosis. There was no regurgitation. - Left atrium: The atrium was moderately dilated. - Right ventricle: The cavity size was normal. Wall thickness was   normal. Systolic function was normal. - Atrial septum: No defect or patent foramen ovale was identified   by color flow Doppler. - Tricuspid valve: There was trivial regurgitation. - Pulmonary arteries: Systolic pressure was within the normal   range. PA peak pressure: 21 mm Hg (S).   Disposition   Pt is being discharged home today in good condition.  Follow-up Plans & Appointments    Follow-up Information    Almyra Deforest, [place]  Follow up on 09/14/2017.   Specialties:  Cardiology, Radiology Why:  Cardiology Hospital Follow-Up on 09/14/2017 at 11:00AM.  Contact information: [address] [place] [address] [phone]          Discharge Instructions    Diet - low sodium heart healthy   Complete by:  As directed    Discharge instructions   Complete by:  As directed    [place].  PLEASE ATTEND ALL SCHEDULED FOLLOW-UP APPOINTMENTS.   Activity: Increase activity slowly as tolerated. You may shower, but no soaking baths (or swimming) for 1 week. No driving for 24 hours. No lifting over 5 lbs for 1 week. No sexual activity for 1 week.   You May Return to Work: in 1 week (if applicable)  Wound Care: You may wash cath site gently with soap and water. Keep cath site clean and dry. If you notice pain, swelling, bleeding or pus at your cath site, please call [phone].   Increase activity slowly   Complete by:  As directed       Discharge Medications     Medication  List    STOP taking these medications   ibuprofen 200 MG tablet Commonly known as:  ADVIL,MOTRIN     TAKE these medications   amLODipine 10 MG tablet Commonly known as:  NORVASC Take 10 mg by mouth every evening.   aspirin 81 MG chewable tablet Chew 1 tablet (81 mg total) by mouth daily.   clopidogrel 75 MG tablet Commonly known as:  PLAVIX Take 1 tablet (75 mg total) by mouth daily.   furosemide 20 MG tablet Commonly known as:  LASIX Take 1 tablet (20 mg total) by mouth daily as needed. for leg swelling What changed:    reasons to take this  additional instructions   glimepiride 4 MG tablet Commonly known as:  AMARYL Take 4 mg by mouth 2 (two) times daily with a meal.   hydrALAZINE 50 MG tablet Commonly known as:  APRESOLINE Take 1 tablet (50 mg total) by mouth 3 (three) times daily. What changed:  when to take this     isosorbide mononitrate 60 MG 24 hr tablet Commonly known as:  IMDUR Take 1 tablet (60 mg total) by mouth daily. What changed:    medication strength  how much to take   labetalol 200 MG tablet Commonly known as:  NORMODYNE Take 200 mg by mouth every evening.   losartan-hydrochlorothiazide 100-25 MG tablet Commonly known as:  HYZAAR Take 1 tablet by mouth every morning.   metFORMIN 1000 MG tablet Commonly known as:  GLUCOPHAGE Take 1 tablet (1,000 mg total) by mouth 2 (two) times daily. Notes to patient:  RESUME ON 08/30/2017.   nitroGLYCERIN 0.4 MG SL tablet Commonly known as:  NITROSTAT Place 1 tablet (0.4 mg total) under the tongue every 5 (five) minutes as needed for chest pain (up to 3 doses).          Allergies Allergies  Allergen Reactions  . Atorvastatin Other (See Comments)    Myalgias  . Crestor [Rosuvastatin] Other (See Comments)    Myalgias   . Other Other (See Comments)    No  BLOOD PRODUCTS - Pt is [employer]  . Pravastatin Other (See Comments)    Myalgias  . Simvastatin Other (See Comments)    Myalgias      Outstanding Labs/Studies   AAA Ultrasound; Repeat BMET and CBC  Duration of Discharge Encounter   Greater than 30 minutes including physician time.  Signed, Erma Heritage, PA-C 08/28/2017, 10:32 AM   I have seen, examined the patient, and reviewed the above assessment and plan.  Changes to above are made where necessary.    Co Sign: Thompson Grayer, MD

## 2017-08-28 NOTE — Progress Notes (Signed)
  Echocardiogram 2D Echocardiogram has been performed.  [name] 08/28/2017, 9:14 AM

## 2017-08-30 ENCOUNTER — Encounter (HOSPITAL_COMMUNITY): Payer: Self-pay | Admitting: Internal Medicine

## 2017-08-31 MED FILL — Heparin Sodium (Porcine) 2 Unit/ML in Sodium Chloride 0.9%: INTRAMUSCULAR | Qty: 1000 | Status: AC

## 2017-09-02 ENCOUNTER — Ambulatory Visit (HOSPITAL_COMMUNITY)
Admission: RE | Admit: 2017-09-02 | Discharge: 2017-09-02 | Disposition: A | Discharge status: Home / Self Care | Payer: Medicare Other | Source: Ambulatory Visit | Attending: Cardiovascular Disease | Admitting: Cardiovascular Disease

## 2017-09-02 DIAGNOSIS — Z87891 Personal history of nicotine dependence: Secondary | ICD-10-CM | POA: Diagnosis not present

## 2017-09-02 DIAGNOSIS — E785 Hyperlipidemia, unspecified: Secondary | ICD-10-CM | POA: Diagnosis not present

## 2017-09-02 DIAGNOSIS — Z8673 Personal history of transient ischemic attack (TIA), and cerebral infarction without residual deficits: Secondary | ICD-10-CM | POA: Insufficient documentation

## 2017-09-02 DIAGNOSIS — I252 Old myocardial infarction: Secondary | ICD-10-CM | POA: Insufficient documentation

## 2017-09-02 DIAGNOSIS — I10 Essential (primary) hypertension: Secondary | ICD-10-CM | POA: Diagnosis not present

## 2017-09-02 DIAGNOSIS — M79671 Pain in right foot: Secondary | ICD-10-CM | POA: Diagnosis not present

## 2017-09-02 DIAGNOSIS — I200 Unstable angina: Secondary | ICD-10-CM | POA: Diagnosis not present

## 2017-09-02 DIAGNOSIS — I714 Abdominal aortic aneurysm, without rupture, unspecified: Secondary | ICD-10-CM

## 2017-09-02 DIAGNOSIS — I2510 Atherosclerotic heart disease of native coronary artery without angina pectoris: Secondary | ICD-10-CM | POA: Diagnosis not present

## 2017-09-02 DIAGNOSIS — E119 Type 2 diabetes mellitus without complications: Secondary | ICD-10-CM | POA: Diagnosis not present

## 2017-09-02 DIAGNOSIS — I728 Aneurysm of other specified arteries: Secondary | ICD-10-CM | POA: Diagnosis not present

## 2017-09-02 DIAGNOSIS — M25561 Pain in right knee: Secondary | ICD-10-CM | POA: Diagnosis not present

## 2017-09-08 NOTE — Progress Notes (Signed)
Only mild dilatation of the aorta noted, no need to fix

## 2017-09-14 ENCOUNTER — Ambulatory Visit (INDEPENDENT_AMBULATORY_CARE_PROVIDER_SITE_OTHER): Payer: Medicare Other | Admitting: Physician Assistant

## 2017-09-14 ENCOUNTER — Encounter: Payer: Self-pay | Admitting: Physician Assistant

## 2017-09-14 VITALS — BP 160/92 | HR 57 | Ht 73.0 in | Wt 223.6 lb

## 2017-09-14 DIAGNOSIS — I480 Paroxysmal atrial fibrillation: Secondary | ICD-10-CM | POA: Diagnosis not present

## 2017-09-14 DIAGNOSIS — E785 Hyperlipidemia, unspecified: Secondary | ICD-10-CM | POA: Diagnosis not present

## 2017-09-14 DIAGNOSIS — E119 Type 2 diabetes mellitus without complications: Secondary | ICD-10-CM | POA: Diagnosis not present

## 2017-09-14 DIAGNOSIS — I25708 Atherosclerosis of coronary artery bypass graft(s), unspecified, with other forms of angina pectoris: Secondary | ICD-10-CM

## 2017-09-14 DIAGNOSIS — I10 Essential (primary) hypertension: Secondary | ICD-10-CM

## 2017-09-14 DIAGNOSIS — N183 Chronic kidney disease, stage 3 unspecified: Secondary | ICD-10-CM

## 2017-09-14 DIAGNOSIS — D649 Anemia, unspecified: Secondary | ICD-10-CM | POA: Diagnosis not present

## 2017-09-14 DIAGNOSIS — I200 Unstable angina: Secondary | ICD-10-CM

## 2017-09-14 DIAGNOSIS — R600 Localized edema: Secondary | ICD-10-CM | POA: Diagnosis not present

## 2017-09-14 MED ORDER — HYDRALAZINE HCL 50 MG PO TABS: 75.0000 mg | ORAL_TABLET | Freq: Three times a day (TID) | ORAL | 3 refills | Status: DC

## 2017-09-14 MED ORDER — FUROSEMIDE 20 MG PO TABS: 20.0000 mg | ORAL_TABLET | Freq: Every day | ORAL | 3 refills | Status: DC

## 2017-09-14 NOTE — Progress Notes (Signed)
Cardiology Office Note    Date:  09/14/2017   ID:  [name], DOB July 21, 1930, MRN [id number]  PCP:  Suzan Garibaldi, FNP  Cardiologist:  Dr. Ellyn Hack    Chief Complaint  Patient presents with  . Follow-up    post cath, no PCI, pt does mention having slight chest pains possible heart burn, swelling in feet, slight SOB noted    History of Present Illness:  [name] is a 84 y.o. male with PMH of AAA, CAD s/p CABG (LIMA to LAD, SVG to PDA) 12/15/2010 by Dr. Servando Snare, HTN, HLD, DM II, post op afib (after CABG, no recurrence), and OSA not on CPAP. He is a Restaurant manager, fast food.  He was admitted for NSTEMI in January 2016 and underwent DES to SVG and RPDA.  He was admitted for STEMI in November 2017, cardiac catheterization obtained on 03/26/2016 showed significant native artery disease was chronic 99% proximal RCA lesion, 70% ostial LAD lesion, patent LIMA to LAD, acutely in-stent restenosis/thrombosis of SVG to PDA treated with 3.0 x 16 mm DES, post PCI, occlusion of the distal PDA was noted at the site of previous 70% stenosis.  Echocardiogram obtained on the same day showed EF 60-65%, grade 1 DD, ascending aortic diameter 45 mm.  He is last office visit with Dr. Ellyn Hack was in December 2018.  Due to his progressive chest pain, he eventually underwent cardiac catheterization on 08/27/2017 which showed severe three-vessel disease with patent LIMA to LAD, patent SVG to RPDA, patent SVG overlapping stents with minimal in-stent restenosis.  Medical therapy was recommended.  Echocardiogram obtained on 08/28/2017 showed EF 60 to 65%, grade 1 DD, mild LVH, ascending aorta 43 mm.  Abdominal aorta obtained on 09/02/2017 only showed mild dilatation.  He was discharged on a higher dose of Imdur and hydralazine.  Patient presents today for cardiology office visit.  He denies any further chest pain at rest, he still has slight chest discomfort with exertion.  This is likely related to small vessel disease.  His  blood pressure remain elevated, I will increase his hydralazine to 75 mg twice daily.  He also has 1-2+ pitting edema bilaterally.  He does have a prior prescription of Lasix as needed, however he has been taking 20 mg Lasix 3 times a week in the past 3 weeks.  I recommended for the patient to start on Lasix 20 mg daily.  I will obtain a CBC and a basic metabolic panel today to assess the anemia and renal function and electrolyte.  He is also due for repeat basic metabolic panel in 1 week to assess renal function and electrolyte after starting on daily dose of Lasix 20 mg.  On follow-up, if he appears to be euvolemic I would recommend discontinue his Hyzaar and the change it to losartan by itself to avoid taking hydrochlorothiazide and Lasix at the same time.   Past Medical History:  Diagnosis Date  . Abdominal aortic aneurysm ([place])    a. Aortic duplex 06/2014: mild aneurysmal dilatation of proximal abdominal aorta measuring 3.4x3.4cm. No sig change from 2012. F/u due 06/2016;   . AKI (acute kidney injury) ([place]) 03/28/2016  . Arthritis    "right knee; never bothered me" (03/26/2016)  . 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 CAD in native artery    a. NSTEMI 11/2010 - CABG x2(LIMA to LAD, SVG to PDA). b. NEG Lexi MV 10/24/13, EF 53%, no perfusion abnormality, septal and  apical HK noted. c. NSTEMI 05/2014 - s/p DES to SVG-RPDA 06/18/14 (Xience Alpine DES 3.0 x 18 mm -3.35 mm), EF 60-65; d. 03/2016 STEMI/PCI: LM nl, ost LAD 70%, mLCx 50%, pRCA 95% - mRCA 60%, dRPDA 70%, LIMA->LAD ok, o-p SVG->RPDA 100% (3.0x16 Promus DES overlapping prior stent).  . Diastolic dysfunction    a. 03/2016 Echo: EF 60-65%, no rwma, Gr1 DD, triv AI, Ao root 55mm, Asc Ao 71mm, triv MR.  . Dizziness    a. Carotid duplex 03/2014: mild fibrous plaque, no significant stenosis.  . Foley catheter in place    "been wearing it for a couple months now" (03/26/2016)  . HTN (hypertension)   .  Hyperlipidemia LDL goal <70   . Non-STEMI (non-ST elevated myocardial infarction) ([place]) 11/2010   1. Ostial LAD 70% (to close to [facility] for PCI), subtotal occlusion of the RCA  . Postoperative atrial fibrillation ([place]) 11/2010   Post CABG, no sign recurrence  . Refusal of blood transfusions as patient is Jehov[employer]'s [employer]   . 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 Servando Snare)  . Sleep apnea    Not on CPAP. (03/26/2016)  . ST elevation myocardial infarction (STEMI) of inferior wall ([place]) 04/05/2016   Occluded in-stent restenosis/thrombosis of SVG-RCA --> treated with overlapping Promus DES 3.0 mm 16 mm postdilated 3.0 mm).  . Stroke [facility]) 2014; 01/2015   a. 2014 with mild right hand weakness, nonhemorrhagic per pt.;; b. - PTA-Stent L ICA 95%   . Type II diabetes mellitus ([place])     Past Surgical History:  Procedure Laterality Date  . APPENDECTOMY    . CARDIAC CATHETERIZATION  12/11/2010   Dr. Chase Picket - subsequent cath - normal LV systolic function, no renal artery stenosis, severe 2-vessel disease with subtotaled RCA prox and distal 60% lesion and complex 70% area of narrowing of ostium of LAD  . CARDIAC CATHETERIZATION N/A 03/26/2016   Procedure: Left Heart Cath and Coronary Angiography;  Surgeon: Nelva Bush, MD;  Location: [place] CV LAB;  Service: Cardiovascular;  Laterality: N/A;  . CARDIAC CATHETERIZATION N/A 03/26/2016   Procedure: Coronary Stent Intervention;  Surgeon: Nelva Bush, MD;  Location: [place] CV LAB;  Service: Cardiovascular: 100% In-stent thrombosis of pros SVG-RCA (Xience DES) --> treated with PromusDES 3.0 x 18 (3.3 mm)  . CARDIAC CATHETERIZATION N/A 03/26/2016   Procedure: Bypass Graft Angiography;  Surgeon: Nelva Bush, MD;  Location: [place] CV LAB;  Service: Cardiovascular;  Laterality: N/A;  . CORONARY ANGIOPLASTY WITH STENT PLACEMENT  06/18/2014   PCI to SVG-RPDA 06/18/14 (Xience Alpine DES 3.0 x 18 mm -3.35 mm),   . CORONARY ARTERY  BYPASS GRAFT  12/15/2010   X2, Dr Servando Snare; LIMA to LAD, SVG to PDA;   . CORONARY/GRAFT ANGIOGRAPHY N/A 08/27/2017   Procedure: CORONARY/GRAFT ANGIOGRAPHY;  Surgeon: Nelva Bush, MD;  Location: [place] CV LAB;  Service: Cardiovascular;  Laterality: N/A;  . CYSTOSCOPY WITH URETHRAL DILATATION    . IR ANGIO INTRA EXTRACRAN SEL COM CAROTID INNOMINATE BILAT MOD SED  02/25/2017  . IR ANGIO VERTEBRAL SEL SUBCLAVIAN INNOMINATE UNI L MOD SED  02/25/2017  . IR GENERIC HISTORICAL  01/21/2016   IR RADIOLOGIST EVAL & MGMT 01/21/2016 [facility]-INTERV RAD  . IR GENERIC HISTORICAL  02/03/2016   IR CATHETER TUBE CHANGE 02/03/2016 Marybelle Killings, MD [facility]-INTERV RAD  . LEFT HEART CATHETERIZATION WITH CORONARY ANGIOGRAM N/A 06/18/2014   Procedure: LEFT HEART CATHETERIZATION WITH CORONARY ANGIOGRAM;  Surgeon: Shanon Brow  Loren Racer, MD;  Location: [facility] CATH LAB;  Service: Cardiovascular;  -- severe disease of SVG-rPDA  . RADIOLOGY WITH ANESTHESIA N/A 01/24/2015   Procedure: STENT ASSISTED ANGIOPLASTY (RADIOLOGY WITH ANESTHESIA);  Surgeon: Luanne Bras, MD;  Location: [place];  Service: Radiology;  Laterality: N/A;  . TONSILLECTOMY    . TRANSTHORACIC ECHOCARDIOGRAM  07/28/2012; 03/25/2015:   a. EF 55-60%, severe Conc LVH, Nl Systolic fxn, G1 DD,  Ao Sclerosis w/o stenosis; CRO PFO;; b. Moderate concentric LVH. EF 60-65%. No RWMA, Gr 1 DD. Mild LA dilation  . TRANSTHORACIC ECHOCARDIOGRAM  03/2016   Hypokinesis of the distal inferior wall. On her concentric hypertrophy. EF 60-65%.  GR 1 DD. Ascending aorta/root 45-46 mm.    Current Medications: Outpatient Medications Prior to Visit  Medication Sig Dispense Refill  . amLODipine (NORVASC) 10 MG tablet Take 10 mg by mouth every evening.     Marland Kitchen aspirin 81 MG chewable tablet Chew 1 tablet (81 mg total) by mouth daily.    . clopidogrel (PLAVIX) 75 MG tablet Take 1 tablet (75 mg total) by mouth daily. 90 tablet 1  . glimepiride (AMARYL) 4 MG tablet Take 4 mg by mouth 2 (two) times daily  with a meal.     . isosorbide mononitrate (IMDUR) 60 MG 24 hr tablet Take 1 tablet (60 mg total) by mouth daily. 90 tablet 3  . labetalol (NORMODYNE) 200 MG tablet Take 200 mg by mouth every evening.     Marland Kitchen losartan-hydrochlorothiazide (HYZAAR) 100-25 MG per tablet Take 1 tablet by mouth every morning.     . metFORMIN (GLUCOPHAGE) 1000 MG tablet Take 1 tablet (1,000 mg total) by mouth 2 (two) times daily.    . nitroGLYCERIN (NITROSTAT) 0.4 MG SL tablet Place 1 tablet (0.4 mg total) under the tongue every 5 (five) minutes as needed for chest pain (up to 3 doses). 25 tablet 3  . furosemide (LASIX) 20 MG tablet Take 1 tablet (20 mg total) by mouth daily as needed. for leg swelling (Patient taking differently: Take 20 mg by mouth daily as needed for fluid. ) 30 tablet 2  . hydrALAZINE (APRESOLINE) 50 MG tablet Take 1 tablet (50 mg total) by mouth 3 (three) times daily. 270 tablet 3   No facility-administered medications prior to visit.      Allergies:   Atorvastatin; Crestor [rosuvastatin]; Other; Pravastatin; and Simvastatin   Social History   Socioeconomic History  . Marital status: Married    Spouse name: [name]  . Number of children: 1  . Years of education: Not on file  . Highest education level: Not on file  Occupational History    Employer: RETIRED  Social Needs  . Financial resource strain: Not on file  . Food insecurity:    Worry: Not on file    Inability: Not on file  . Transportation needs:    Medical: Not on file    Non-medical: Not on file  Tobacco Use  . Smoking status: Former Smoker    Packs/day: 0.33    Years: 10.00    Pack years: 3.30    Types: Cigarettes    Last attempt to quit: 1961    Years since quitting: 58.3  . Smokeless tobacco: Never Used  Substance and Sexual Activity  . Alcohol use: No  . Drug use: No  . Sexual activity: Never  Lifestyle  . Physical activity:    Days per week: Not on file    Minutes per session: Not on file  .  Stress: Not on file    Relationships  . Social connections:    Talks on phone: Not on file    Gets together: Not on file    Attends religious service: Not on file    Active member of club or organization: Not on file    Attends meetings of clubs or organizations: Not on file    Relationship status: Not on file  Other Topics Concern  . Not on file  Social History Narrative   Married father of one.  Previously uses stationary bike routinely.  Now reduced due to other social stressors.   Quit smoking 50 years ago.  Does not drink alcohol     Family History:  The patient's family history includes Heart Problems (age of onset: 7) in his father.   ROS:   Please see the history of present illness.    ROS All other systems reviewed and are negative.   PHYSICAL EXAM:   VS:  BP (!) 160/92 (BP Location: Left Arm)   Pulse (!) 57   Ht 6\' 1"  (1.854 m)   Wt 223 lb 9.6 oz (101.4 kg)   BMI 29.50 kg/m    GEN: Well nourished, well developed, in no acute distress  HEENT: normal  Neck: no JVD, carotid bruits, or masses Cardiac: RRR; no murmurs, rubs, or gallops. 1+ pitting edema  Respiratory:  clear to auscultation bilaterally, normal work of breathing GI: soft, nontender, nondistended, + BS MS: no deformity or atrophy  Skin: warm and dry, no rash Neuro:  Alert and Oriented x 3, Strength and sensation are intact Psych: euthymic mood, full affect  Wt Readings from Last 3 Encounters:  09/14/17 223 lb 9.6 oz (101.4 kg)  08/28/17 236 lb 8.9 oz (107.3 kg)  08/25/17 229 lb (103.9 kg)      Studies/Labs Reviewed:   EKG:  EKG is not ordered today.    Recent Labs: 01/11/2017: ALT 7 08/28/2017: BUN 27; Creatinine, Ser 1.42; Hemoglobin 9.1; Platelets 259; Potassium 4.0; Sodium 137   Lipid Panel    Component Value Date/Time   CHOL 134 01/11/2017 0958   TRIG 83 01/11/2017 0958   HDL 40 01/11/2017 0958   CHOLHDL 3.4 01/11/2017 0958   CHOLHDL 3.2 03/26/2016 0531   VLDL 16 03/26/2016 0531   LDLCALC 77 01/11/2017  0958    Additional studies/ records that were reviewed today include:   Cath 08/27/2017 Conclusions: 1. Severe 3-vessel coronary artery disease, as detailed below.  Mid RCA is now occluded.  Otherwise, there has been no significant change since 03/2016. 2. Widely patent LIMA-LAD. 3. Patent SVG-rPDA. Patent SVG overlapping stents have minimal in-stent restenosis. 4. Inability to cross aortic valve from the left radial. 5. Systemic hypertension  Recommendations: 1. Escalate medical therapy; will increase isosorbide mononitrate and hydralazine. 2. Obtain echocardiogram to assess LVEF. 3. Admit for overnight extended recovery, as the patient does not have a caregiver to stay with him overnight.    ASSESSMENT:    1. Lower extremity edema   2. CKD (chronic kidney disease), stage III ([place])   3. Anemia, unspecified type   4. Coronary artery disease of bypass graft of native heart with stable angina pectoris ([place])   5. Hyperlipidemia, unspecified hyperlipidemia type   6. Essential hypertension   7. Controlled type 2 diabetes mellitus without complication, without long-term current use of insulin ([place])   8. PAF (paroxysmal atrial fibrillation) ([facility])      PLAN:  In order of problems listed above:  1. Lower extremity edema: He has 1-2+ pitting edema in bilateral lower extremity.  He has been taking Lasix 3 times a week for the past 3 weeks.  Last creatinine was 1.4 in the hospital, I will obtain a basic metabolic panel today.  I will also increase his Lasix to 20 mg daily.  He will need a repeat basic metabolic panel in 1 week.  If he tolerate 20 mg daily of Lasix, I plan to discontinue his Hyzaar later and to switch him to losartan by itself to avoid 2 different diuretic medications at the same time.  2. Anemia: Hemoglobin was 9 in the hospital, will repeat CBC today   3. CKD s/p CABG: Recent cardiac catheterization showed stable anatomy.  He does have some small vessel disease which is  likely responsible for his stable angina.  We will continue to uptitrate Imdur and hydralazine.  4. Hypertension: Blood pressure elevated today, will increase hydralazine to 75 mg 3 times daily.  5. Hyperlipidemia: Consider referring the patient to lipid clinic for consideration of PCSK9 inhibitor as he has multiple statin intolerance  6. DM 2: Managed by primary care provider, on glimepiride  7. CKD stage III: Obtain basic metabolic panel today and also in 1 week given the increasing the diuretic of Lasix to 20 mg daily.   Medication Adjustments/Labs and Tests Ordered: Current medicines are reviewed at length with the patient today.  Concerns regarding medicines are outlined above.  Medication changes, Labs and Tests ordered today are listed in the Patient Instructions below. Patient Instructions  Medication Instructions:  Hydralazine 75 mg three times a day Lasix 20 mg once a day  Labwork: Your physician recommends that you return for lab work in: Today (BMET, CBC) 1 week for (BMET)   Testing/Procedures: None  Follow-Up: Your physician recommends that you schedule a follow-up appointment in: 3 months with Dr Ellyn Hack   Any Other Special Instructions Will Be Listed Below (If Applicable).     If you need a refill on your cardiac medications before your next appointment, please call your pharmacy.      Hilbert Corrigan, [place]  09/14/2017 1:42 PM    [facility] [address] [place], [place], [place]  [address] Phone: [phone]; Fax: [phone]

## 2017-09-14 NOTE — Patient Instructions (Signed)
Medication Instructions:  Hydralazine 75 mg three times a day Lasix 20 mg once a day  Labwork: Your physician recommends that you return for lab work in: Today (BMET, CBC) 1 week for (BMET)   Testing/Procedures: None  Follow-Up: Your physician recommends that you schedule a follow-up appointment in: 3 months with Dr Ellyn Hack   Any Other Special Instructions Will Be Listed Below (If Applicable).     If you need a refill on your cardiac medications before your next appointment, please call your pharmacy.

## 2017-09-15 LAB — BASIC METABOLIC PANEL
BUN / CREAT RATIO: 18 (ref 10–24)
BUN: 22 mg/dL (ref 8–27)
CO2: 24 mmol/L (ref 20–29)
Calcium: 9.2 mg/dL (ref 8.6–10.2)
Chloride: 103 mmol/L (ref 96–106)
Creatinine, Ser: 1.24 mg/dL (ref 0.76–1.27)
GFR calc non Af Amer: 53 mL/min/{1.73_m2} — ABNORMAL LOW (ref >59)
GFR, EST AFRICAN AMERICAN: 61 mL/min/{1.73_m2} (ref >59)
GLUCOSE: 132 mg/dL — AB (ref 65–99)
Potassium: 5 mmol/L (ref 3.5–5.2)
SODIUM: 143 mmol/L (ref 134–144)

## 2017-09-15 LAB — CBC
Hematocrit: 33.5 % — ABNORMAL LOW (ref 37.5–51.0)
Hemoglobin: 10.7 g/dL — ABNORMAL LOW (ref 13.0–17.7)
MCH: 29.2 pg (ref 26.6–33.0)
MCHC: 31.9 g/dL (ref 31.5–35.7)
MCV: 92 fL (ref 79–97)
PLATELETS: 334 10*3/uL (ref 150–379)
RBC: 3.66 x10E6/uL — AB (ref 4.14–5.80)
RDW: 16.8 % — ABNORMAL HIGH (ref 12.3–15.4)
WBC: 6.7 10*3/uL (ref 3.4–10.8)

## 2017-09-17 ENCOUNTER — Ambulatory Visit (HOSPITAL_COMMUNITY)
Admission: RE | Admit: 2017-09-17 | Discharge: 2017-09-17 | Disposition: A | Discharge status: Home / Self Care | Payer: Medicare Other | Source: Ambulatory Visit | Attending: Interventional Radiology | Admitting: Interventional Radiology

## 2017-09-17 ENCOUNTER — Ambulatory Visit (HOSPITAL_COMMUNITY): Payer: Medicare Other

## 2017-09-17 DIAGNOSIS — R338 Other retention of urine: Secondary | ICD-10-CM | POA: Diagnosis not present

## 2017-09-17 DIAGNOSIS — I771 Stricture of artery: Secondary | ICD-10-CM

## 2017-09-17 DIAGNOSIS — I6602 Occlusion and stenosis of left middle cerebral artery: Secondary | ICD-10-CM | POA: Diagnosis not present

## 2017-09-17 DIAGNOSIS — I6521 Occlusion and stenosis of right carotid artery: Secondary | ICD-10-CM | POA: Insufficient documentation

## 2017-09-17 DIAGNOSIS — I672 Cerebral atherosclerosis: Secondary | ICD-10-CM | POA: Diagnosis not present

## 2017-09-17 MED ORDER — GADOBENATE DIMEGLUMINE 529 MG/ML IV SOLN
20.0000 mL | Freq: Once | INTRAVENOUS | Status: AC
  Administered 2017-09-17: 20 mL via INTRAVENOUS

## 2017-09-22 ENCOUNTER — Telehealth: Payer: Self-pay

## 2017-09-22 DIAGNOSIS — N183 Chronic kidney disease, stage 3 (moderate): Secondary | ICD-10-CM | POA: Diagnosis not present

## 2017-09-22 LAB — BASIC METABOLIC PANEL
BUN / CREAT RATIO: 22 (ref 10–24)
BUN: 35 mg/dL — AB (ref 8–27)
CALCIUM: 9 mg/dL (ref 8.6–10.2)
CHLORIDE: 99 mmol/L (ref 96–106)
CO2: 22 mmol/L (ref 20–29)
CREATININE: 1.57 mg/dL — AB (ref 0.76–1.27)
GFR calc Af Amer: 46 mL/min/{1.73_m2} — ABNORMAL LOW (ref >59)
GFR calc non Af Amer: 40 mL/min/{1.73_m2} — ABNORMAL LOW (ref >59)
GLUCOSE: 169 mg/dL — AB (ref 65–99)
Potassium: 4.8 mmol/L (ref 3.5–5.2)
Sodium: 140 mmol/L (ref 134–144)

## 2017-09-22 MED ORDER — CLOPIDOGREL BISULFATE 75 MG PO TABS: 75.0000 mg | ORAL_TABLET | Freq: Every day | ORAL | 3 refills | Status: DC

## 2017-09-22 NOTE — Telephone Encounter (Signed)
Rx(s) sent to pharmacy electronically.  

## 2017-09-23 ENCOUNTER — Telehealth (HOSPITAL_COMMUNITY): Payer: Self-pay

## 2017-09-23 ENCOUNTER — Other Ambulatory Visit: Payer: Self-pay

## 2017-09-23 MED ORDER — FUROSEMIDE 20 MG PO TABS: 20.0000 mg | ORAL_TABLET | ORAL | 0 refills | Status: DC | PRN

## 2017-09-23 NOTE — Telephone Encounter (Signed)
Called regarding recent mri, pt was out. Will try back later. AW

## 2017-10-07 ENCOUNTER — Other Ambulatory Visit (HOSPITAL_COMMUNITY): Payer: Self-pay | Admitting: Interventional Radiology

## 2017-10-07 DIAGNOSIS — I771 Stricture of artery: Secondary | ICD-10-CM

## 2017-10-13 ENCOUNTER — Ambulatory Visit: Payer: Medicare Other | Admitting: Neurology

## 2017-10-18 ENCOUNTER — Other Ambulatory Visit: Payer: Self-pay | Admitting: Radiology

## 2017-10-18 DIAGNOSIS — N419 Inflammatory disease of prostate, unspecified: Secondary | ICD-10-CM | POA: Diagnosis not present

## 2017-10-18 DIAGNOSIS — I10 Essential (primary) hypertension: Secondary | ICD-10-CM | POA: Diagnosis not present

## 2017-10-18 DIAGNOSIS — E039 Hypothyroidism, unspecified: Secondary | ICD-10-CM | POA: Diagnosis not present

## 2017-10-18 DIAGNOSIS — E119 Type 2 diabetes mellitus without complications: Secondary | ICD-10-CM | POA: Diagnosis not present

## 2017-10-18 DIAGNOSIS — E7800 Pure hypercholesterolemia, unspecified: Secondary | ICD-10-CM | POA: Diagnosis not present

## 2017-10-19 ENCOUNTER — Encounter (HOSPITAL_COMMUNITY): Payer: Self-pay

## 2017-10-19 ENCOUNTER — Other Ambulatory Visit (HOSPITAL_COMMUNITY): Payer: Self-pay | Admitting: Interventional Radiology

## 2017-10-19 ENCOUNTER — Ambulatory Visit (HOSPITAL_COMMUNITY)
Admission: RE | Admit: 2017-10-19 | Discharge: 2017-10-19 | Disposition: A | Discharge status: Home / Self Care | Payer: Medicare Other | Source: Ambulatory Visit | Attending: Interventional Radiology | Admitting: Interventional Radiology

## 2017-10-19 DIAGNOSIS — I69331 Monoplegia of upper limb following cerebral infarction affecting right dominant side: Secondary | ICD-10-CM | POA: Insufficient documentation

## 2017-10-19 DIAGNOSIS — I4891 Unspecified atrial fibrillation: Secondary | ICD-10-CM | POA: Diagnosis not present

## 2017-10-19 DIAGNOSIS — I10 Essential (primary) hypertension: Secondary | ICD-10-CM | POA: Insufficient documentation

## 2017-10-19 DIAGNOSIS — Z951 Presence of aortocoronary bypass graft: Secondary | ICD-10-CM | POA: Insufficient documentation

## 2017-10-19 DIAGNOSIS — G4730 Sleep apnea, unspecified: Secondary | ICD-10-CM | POA: Diagnosis not present

## 2017-10-19 DIAGNOSIS — M1711 Unilateral primary osteoarthritis, right knee: Secondary | ICD-10-CM | POA: Diagnosis not present

## 2017-10-19 DIAGNOSIS — I714 Abdominal aortic aneurysm, without rupture: Secondary | ICD-10-CM | POA: Insufficient documentation

## 2017-10-19 DIAGNOSIS — I771 Stricture of artery: Secondary | ICD-10-CM

## 2017-10-19 DIAGNOSIS — Z7982 Long term (current) use of aspirin: Secondary | ICD-10-CM | POA: Insufficient documentation

## 2017-10-19 DIAGNOSIS — I252 Old myocardial infarction: Secondary | ICD-10-CM | POA: Diagnosis not present

## 2017-10-19 DIAGNOSIS — Z7902 Long term (current) use of antithrombotics/antiplatelets: Secondary | ICD-10-CM | POA: Insufficient documentation

## 2017-10-19 DIAGNOSIS — Z87891 Personal history of nicotine dependence: Secondary | ICD-10-CM | POA: Diagnosis not present

## 2017-10-19 DIAGNOSIS — E785 Hyperlipidemia, unspecified: Secondary | ICD-10-CM | POA: Diagnosis not present

## 2017-10-19 DIAGNOSIS — I6523 Occlusion and stenosis of bilateral carotid arteries: Secondary | ICD-10-CM | POA: Insufficient documentation

## 2017-10-19 DIAGNOSIS — I6602 Occlusion and stenosis of left middle cerebral artery: Secondary | ICD-10-CM | POA: Diagnosis present

## 2017-10-19 DIAGNOSIS — I651 Occlusion and stenosis of basilar artery: Secondary | ICD-10-CM | POA: Diagnosis not present

## 2017-10-19 DIAGNOSIS — I6603 Occlusion and stenosis of bilateral middle cerebral arteries: Secondary | ICD-10-CM | POA: Diagnosis not present

## 2017-10-19 DIAGNOSIS — Z7984 Long term (current) use of oral hypoglycemic drugs: Secondary | ICD-10-CM | POA: Diagnosis not present

## 2017-10-19 DIAGNOSIS — I2510 Atherosclerotic heart disease of native coronary artery without angina pectoris: Secondary | ICD-10-CM | POA: Insufficient documentation

## 2017-10-19 DIAGNOSIS — E119 Type 2 diabetes mellitus without complications: Secondary | ICD-10-CM | POA: Diagnosis not present

## 2017-10-19 DIAGNOSIS — Z955 Presence of coronary angioplasty implant and graft: Secondary | ICD-10-CM | POA: Diagnosis not present

## 2017-10-19 HISTORY — PX: IR ANGIO VERTEBRAL SEL SUBCLAVIAN INNOMINATE UNI R MOD SED: IMG5365

## 2017-10-19 HISTORY — PX: IR ANGIO INTRA EXTRACRAN SEL COM CAROTID INNOMINATE BILAT MOD SED: IMG5360

## 2017-10-19 HISTORY — PX: IR ANGIO VERTEBRAL SEL VERTEBRAL UNI L MOD SED: IMG5367

## 2017-10-19 LAB — BASIC METABOLIC PANEL
ANION GAP: 9 (ref 5–15)
BUN: 22 mg/dL — ABNORMAL HIGH (ref 6–20)
CHLORIDE: 105 mmol/L (ref 101–111)
CO2: 27 mmol/L (ref 22–32)
Calcium: 8.9 mg/dL (ref 8.9–10.3)
Creatinine, Ser: 1.37 mg/dL — ABNORMAL HIGH (ref 0.61–1.24)
GFR calc Af Amer: 53 mL/min — ABNORMAL LOW (ref >60)
GFR, EST NON AFRICAN AMERICAN: 46 mL/min — AB (ref >60)
Glucose, Bld: 109 mg/dL — ABNORMAL HIGH (ref 65–99)
POTASSIUM: 4.4 mmol/L (ref 3.5–5.1)
SODIUM: 141 mmol/L (ref 135–145)

## 2017-10-19 LAB — CBC
HEMATOCRIT: 32.3 % — AB (ref 39.0–52.0)
HEMOGLOBIN: 10.1 g/dL — AB (ref 13.0–17.0)
MCH: 28.7 pg (ref 26.0–34.0)
MCHC: 31.3 g/dL (ref 30.0–36.0)
MCV: 91.8 fL (ref 78.0–100.0)
Platelets: 277 10*3/uL (ref 150–400)
RBC: 3.52 MIL/uL — AB (ref 4.22–5.81)
RDW: 16.5 % — ABNORMAL HIGH (ref 11.5–15.5)
WBC: 6.2 10*3/uL (ref 4.0–10.5)

## 2017-10-19 LAB — PROTIME-INR
INR: 1.02
Prothrombin Time: 13.3 seconds (ref 11.4–15.2)

## 2017-10-19 LAB — GLUCOSE, CAPILLARY: Glucose-Capillary: 120 mg/dL — ABNORMAL HIGH (ref 65–99)

## 2017-10-19 MED ORDER — MIDAZOLAM HCL 2 MG/2ML IJ SOLN
INTRAMUSCULAR | Status: AC
  Filled 2017-10-19: qty 2

## 2017-10-19 MED ORDER — AMLODIPINE BESYLATE 10 MG PO TABS
10.0000 mg | ORAL_TABLET | ORAL | Status: AC
  Administered 2017-10-19: 10 mg via ORAL
  Filled 2017-10-19: qty 1

## 2017-10-19 MED ORDER — LIDOCAINE HCL 1 % IJ SOLN
INTRAMUSCULAR | Status: AC
  Filled 2017-10-19: qty 20

## 2017-10-19 MED ORDER — SODIUM CHLORIDE 0.9 % IV SOLN
INTRAVENOUS | Status: AC | PRN
  Administered 2017-10-19: 250 mL/h via INTRAVENOUS

## 2017-10-19 MED ORDER — IOHEXOL 300 MG/ML  SOLN: 70.0000 mL | Freq: Once | INTRAMUSCULAR | Status: DC | PRN

## 2017-10-19 MED ORDER — IOPAMIDOL (ISOVUE-300) INJECTION 61%
INTRAVENOUS | Status: AC
  Filled 2017-10-19: qty 50

## 2017-10-19 MED ORDER — ISOSORBIDE MONONITRATE ER 60 MG PO TB24
60.0000 mg | ORAL_TABLET | Freq: Every day | ORAL | Status: DC
  Administered 2017-10-19: 60 mg via ORAL
  Filled 2017-10-19: qty 1

## 2017-10-19 MED ORDER — FENTANYL CITRATE (PF) 100 MCG/2ML IJ SOLN
INTRAMUSCULAR | Status: AC | PRN
  Administered 2017-10-19: 25 ug via INTRAVENOUS

## 2017-10-19 MED ORDER — HYDRALAZINE HCL 20 MG/ML IJ SOLN
INTRAMUSCULAR | Status: AC
  Filled 2017-10-19: qty 1

## 2017-10-19 MED ORDER — SODIUM CHLORIDE 0.9 % IV SOLN: INTRAVENOUS | Status: AC

## 2017-10-19 MED ORDER — FENTANYL CITRATE (PF) 100 MCG/2ML IJ SOLN
INTRAMUSCULAR | Status: AC
  Filled 2017-10-19: qty 2

## 2017-10-19 MED ORDER — SODIUM CHLORIDE 0.9 % IV SOLN
Freq: Once | INTRAVENOUS | Status: AC
  Administered 2017-10-19: 08:00:00 via INTRAVENOUS

## 2017-10-19 MED ORDER — MIDAZOLAM HCL 2 MG/2ML IJ SOLN
INTRAMUSCULAR | Status: AC | PRN
  Administered 2017-10-19: 1 mg via INTRAVENOUS

## 2017-10-19 MED ORDER — HYDRALAZINE HCL 20 MG/ML IJ SOLN
INTRAMUSCULAR | Status: AC | PRN
  Administered 2017-10-19 (×3): 5 mg via INTRAVENOUS

## 2017-10-19 MED ORDER — HEPARIN SODIUM (PORCINE) 1000 UNIT/ML IJ SOLN
INTRAMUSCULAR | Status: AC
  Filled 2017-10-19: qty 3

## 2017-10-19 MED ORDER — LIDOCAINE HCL (PF) 1 % IJ SOLN
INTRAMUSCULAR | Status: AC | PRN
  Administered 2017-10-19: 20 mL

## 2017-10-19 MED ORDER — HYDRALAZINE HCL 50 MG PO TABS
75.0000 mg | ORAL_TABLET | ORAL | Status: AC
  Administered 2017-10-19: 75 mg via ORAL
  Filled 2017-10-19: qty 1

## 2017-10-19 MED ORDER — HEPARIN SODIUM (PORCINE) 1000 UNIT/ML IJ SOLN
INTRAMUSCULAR | Status: AC | PRN
  Administered 2017-10-19: 1000 [IU] via INTRAVENOUS

## 2017-10-19 NOTE — Sedation Documentation (Signed)
Patient is resting comfortably. 

## 2017-10-19 NOTE — Discharge Instructions (Signed)

## 2017-10-19 NOTE — Sedation Documentation (Signed)
IR holding pressure R groin

## 2017-10-19 NOTE — Progress Notes (Signed)
Pt ambulatory in hall, tolerated well. Right groin level zero. Will d/c home

## 2017-10-19 NOTE — Sedation Documentation (Signed)
Pressure hold to R  Groin complete, pressure dsg on, bedrest til 1420.

## 2017-10-19 NOTE — Procedures (Signed)
S/P 4 vessel cerebral artriograms. RT CFA approach. Findings.Marland Kitchen 1.Severe 90 % plus stenosis of Lt MCA M1 seg with  Slow flow in the Lt MCA superior division .sig change from angio of 2016 2.. Previous LT ICA angioplasty of petrous seg wide open.

## 2017-10-19 NOTE — H&P (Signed)
Chief Complaint: Patient was seen in consultation today for cerebral arteriogram at the request of Dr Lavera Guise  Supervising Physician: Luanne Bras  Patient Status: [facility] - Out-pt  History of Present Illness: [name] is a 84 y.o. male   Previous L ICA angioplasty 01/2015 in IR with Dr Estanislado Pandy Known Bilat MCA stenosis Pt has no complaints Denies N/V Denies dizziness or gait unsteadiness Denies headaches Denies vision; speech changes  MR 09/17/17:  IMPRESSION: 1. No acute intracranial abnormality. Stable MRI appearance of the brain since 2018. 2. Advanced chronic intracranial atherosclerosis re-demonstrated by MRA. Progressive moderate stenosis of the right MCA mid M1 segment is suspected since 2018. But other widespread significant intracranial atherosclerotic disease appears stable from prior MRAs - including suspected chronic occlusion of the left ACA A2 segment.  Scheduled today for follow up cerebral arteriogram   Past Medical History:  Diagnosis Date  . Abdominal aortic aneurysm ([place])    a. Aortic duplex 06/2014: mild aneurysmal dilatation of proximal abdominal aorta measuring 3.4x3.4cm. No sig change from 2012. F/u due 06/2016;   . AKI (acute kidney injury) ([place]) 03/28/2016  . Arthritis    "right knee; never bothered me" (03/26/2016)  . 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 CAD in native artery    a. NSTEMI 11/2010 - CABG x2(LIMA to LAD, SVG to PDA). b. NEG Lexi MV 10/24/13, EF 53%, no perfusion abnormality, septal and apical HK noted. c. NSTEMI 05/2014 - s/p DES to SVG-RPDA 06/18/14 (Xience Alpine DES 3.0 x 18 mm -3.35 mm), EF 60-65; d. 03/2016 STEMI/PCI: LM nl, ost LAD 70%, mLCx 50%, pRCA 95% - mRCA 60%, dRPDA 70%, LIMA->LAD ok, o-p SVG->RPDA 100% (3.0x16 Promus DES overlapping prior stent).  . Diastolic dysfunction    a. 03/2016 Echo: EF 60-65%, no rwma, Gr1 DD, triv AI, Ao root 28mm, Asc Ao 71mm, triv MR.  . Dizziness     a. Carotid duplex 03/2014: mild fibrous plaque, no significant stenosis.  . Foley catheter in place    "been wearing it for a couple months now" (03/26/2016)  . HTN (hypertension)   . Hyperlipidemia LDL goal <70   . Non-STEMI (non-ST elevated myocardial infarction) ([place]) 11/2010   1. Ostial LAD 70% (to close to [facility] for PCI), subtotal occlusion of the RCA  . Postoperative atrial fibrillation ([place]) 11/2010   Post CABG, no sign recurrence  . Refusal of blood transfusions as patient is Jehov[employer]'s Witness   . 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 Servando Snare)  . Sleep apnea    Not on CPAP. (03/26/2016)  . ST elevation myocardial infarction (STEMI) of inferior wall ([place]) 04/05/2016   Occluded in-stent restenosis/thrombosis of SVG-RCA --> treated with overlapping Promus DES 3.0 mm 16 mm postdilated 3.0 mm).  . Stroke [facility]) 2014; 01/2015   a. 2014 with mild right hand weakness, nonhemorrhagic per pt.;; b. - PTA-Stent L ICA 95%   . Type II diabetes mellitus ([place])     Past Surgical History:  Procedure Laterality Date  . APPENDECTOMY    . CARDIAC CATHETERIZATION  12/11/2010   Dr. Chase Picket - subsequent cath - normal LV systolic function, no renal artery stenosis, severe 2-vessel disease with subtotaled RCA prox and distal 60% lesion and complex 70% area of narrowing of ostium of LAD  . CARDIAC CATHETERIZATION N/A 03/26/2016   Procedure: Left Heart Cath and Coronary Angiography;  Surgeon: Nelva Bush, MD;  Location: [place] CV [facility];  Service: Cardiovascular;  Laterality: N/A;  . CARDIAC CATHETERIZATION N/A 03/26/2016   Procedure: Coronary Stent Intervention;  Surgeon: Nelva Bush, MD;  Location: [place] [facility];  Service: Cardiovascular: 100% In-stent thrombosis of pros SVG-RCA (Xience DES) --> treated with PromusDES 3.0 x 18 (3.3 mm)  . CARDIAC CATHETERIZATION N/A 03/26/2016   Procedure: Bypass Graft Angiography;  Surgeon: Nelva Bush, MD;  Location: [place] [facility];   Service: Cardiovascular;  Laterality: N/A;  . CORONARY ANGIOPLASTY WITH STENT PLACEMENT  06/18/2014   PCI to SVG-RPDA 06/18/14 (Xience Alpine DES 3.0 x 18 mm -3.35 mm),   . CORONARY ARTERY BYPASS GRAFT  12/15/2010   X2, Dr Servando Snare; LIMA to LAD, SVG to PDA;   . CORONARY/GRAFT ANGIOGRAPHY N/A 08/27/2017   Procedure: CORONARY/GRAFT ANGIOGRAPHY;  Surgeon: Nelva Bush, MD;  Location: [place] [facility];  Service: Cardiovascular;  Laterality: N/A;  . CYSTOSCOPY WITH URETHRAL DILATATION    . IR ANGIO INTRA EXTRACRAN SEL COM CAROTID INNOMINATE BILAT MOD SED  02/25/2017  . IR ANGIO VERTEBRAL SEL SUBCLAVIAN INNOMINATE UNI L MOD SED  02/25/2017  . IR GENERIC HISTORICAL  01/21/2016   IR RADIOLOGIST EVAL & MGMT 01/21/2016 [facility]-INTERV RAD  . IR GENERIC HISTORICAL  02/03/2016   IR CATHETER TUBE CHANGE 02/03/2016 Marybelle Killings, MD [facility]-INTERV RAD  . LEFT HEART CATHETERIZATION WITH CORONARY ANGIOGRAM N/A 06/18/2014   Procedure: LEFT HEART CATHETERIZATION WITH CORONARY ANGIOGRAM;  Surgeon: Leonie Man, MD;  Location: [facility] CATH LAB;  Service: Cardiovascular;  -- severe disease of SVG-rPDA  . RADIOLOGY WITH ANESTHESIA N/A 01/24/2015   Procedure: STENT ASSISTED ANGIOPLASTY (RADIOLOGY WITH ANESTHESIA);  Surgeon: Luanne Bras, MD;  Location: [facility];  Service: Radiology;  Laterality: N/A;  . TONSILLECTOMY    . TRANSTHORACIC ECHOCARDIOGRAM  07/28/2012; 03/25/2015:   a. EF 55-60%, severe Conc LVH, Nl Systolic fxn, G1 DD,  Ao Sclerosis w/o stenosis; CRO PFO;; b. Moderate concentric LVH. EF 60-65%. No RWMA, Gr 1 DD. Mild LA dilation  . TRANSTHORACIC ECHOCARDIOGRAM  03/2016   Hypokinesis of the distal inferior wall. On her concentric hypertrophy. EF 60-65%.  GR 1 DD. Ascending aorta/root 45-46 mm.    Allergies: Other; Atorvastatin; Crestor [rosuvastatin]; Pravastatin; and Simvastatin  Medications: Prior to Admission medications   Medication Sig Start Date End Date Taking? Authorizing Provider  amLODipine (NORVASC) 10 MG  tablet Take 10 mg by mouth every evening.    Yes [provider]  aspirin 81 MG chewable tablet Chew 1 tablet (81 mg total) by mouth daily. 03/29/16  Yes Isaiah Serge, NP  clopidogrel (PLAVIX) 75 MG tablet Take 1 tablet (75 mg total) by mouth daily. 09/22/17  Yes Croitoru, Mihai, MD  furosemide (LASIX) 20 MG tablet Take 1 tablet (20 mg total) by mouth as needed. Patient taking differently: Take 20 mg by mouth daily as needed for fluid.  09/23/17 12/22/17 Yes Almyra Deforest, PA  glimepiride (AMARYL) 4 MG tablet Take 4 mg by mouth 2 (two) times daily with a meal.    Yes [provider]  hydrALAZINE (APRESOLINE) 50 MG tablet Take 1.5 tablets (75 mg total) by mouth 3 (three) times daily. 09/14/17  Yes Almyra Deforest, PA  isosorbide mononitrate (IMDUR) 60 MG 24 hr tablet Take 1 tablet (60 mg total) by mouth daily. 08/28/17  Yes Strader, [place] M, PA-C  labetalol (NORMODYNE) 200 MG tablet Take 200 mg by mouth every evening.    Yes [provider]  losartan-hydrochlorothiazide (HYZAAR) 100-25  MG per tablet Take 1 tablet by mouth every morning.    Yes [provider]  metFORMIN (GLUCOPHAGE) 1000 MG tablet Take 1 tablet (1,000 mg total) by mouth 2 (two) times daily. 03/29/16  Yes Isaiah Serge, NP  nitroGLYCERIN (NITROSTAT) 0.4 MG SL tablet Place 1 tablet (0.4 mg total) under the tongue every 5 (five) minutes as needed for chest pain (up to 3 doses). 08/25/17  Yes Almyra Deforest, PA  oxybutynin (DITROPAN) 5 MG tablet Take 5 mg by mouth daily as needed for bladder spasms.  09/07/17  Yes [provider]     Family History  Problem Relation Age of Onset  . Heart Problems Father 2    Social History   Socioeconomic History  . Marital status: Married    Spouse name: [name]  . Number of children: 1  . Years of education: Not on file  . Highest education level: Not on file  Occupational History    Employer: RETIRED  Social Needs  . Financial resource strain: Not on file  .  Food insecurity:    Worry: Not on file    Inability: Not on file  . Transportation needs:    Medical: Not on file    Non-medical: Not on file  Tobacco Use  . Smoking status: Former Smoker    Packs/day: 0.33    Years: 10.00    Pack years: 3.30    Types: Cigarettes    Last attempt to quit: 1961    Years since quitting: 58.4  . Smokeless tobacco: Never Used  Substance and Sexual Activity  . Alcohol use: No  . Drug use: No  . Sexual activity: Never  Lifestyle  . Physical activity:    Days per week: Not on file    Minutes per session: Not on file  . Stress: Not on file  Relationships  . Social connections:    Talks on phone: Not on file    Gets together: Not on file    Attends religious service: Not on file    Active member of club or organization: Not on file    Attends meetings of clubs or organizations: Not on file    Relationship status: Not on file  Other Topics Concern  . Not on file  Social History Narrative   Married father of one.  Previously uses stationary bike routinely.  Now reduced due to other social stressors.   Quit smoking 50 years ago.  Does not drink alcohol    Review of Systems: A 12 point ROS discussed and pertinent positives are indicated in the HPI above.  All other systems are negative.  Review of Systems  Constitutional: Negative for activity change, fatigue and fever.  HENT: Negative for tinnitus and trouble swallowing.   Eyes: Negative for visual disturbance.  Respiratory: Negative for shortness of breath.   Cardiovascular: Negative for chest pain.  Gastrointestinal: Negative for abdominal pain.  Musculoskeletal: Negative for gait problem.  Neurological: Negative for dizziness, tremors, seizures, syncope, facial asymmetry, speech difficulty, weakness, light-headedness, numbness and headaches.  Psychiatric/Behavioral: Negative for behavioral problems, confusion and decreased concentration.    Vital Signs: BP (!) 193/87 (BP Location: Right  Arm)   Pulse (!) 52   Ht 6\' 1"  (1.854 m)   Wt 220 lb (99.8 kg)   SpO2 100%   BMI 29.03 kg/m   Physical Exam  Constitutional: He is oriented to person, place, and time. He appears well-nourished.  HENT:  Head: Atraumatic.  Eyes: EOM  are normal.  Neck: Normal range of motion. Neck supple.  Cardiovascular: Normal rate and regular rhythm.  No murmur heard. Pulmonary/Chest: Effort normal and breath sounds normal.  Abdominal: Soft. Bowel sounds are normal.  Musculoskeletal: Normal range of motion. He exhibits no tenderness.  Neurological: He is alert and oriented to person, place, and time.  Skin: Skin is warm and dry.  Psychiatric: He has a normal mood and affect. His behavior is normal. Judgment and thought content normal.  Nursing note and vitals reviewed.   Imaging: No results found.  Labs:  CBC: Recent Labs    08/25/17 1009 08/28/17 0209 09/14/17 1244 10/19/17 0818  WBC 5.7 5.0 6.7 6.2  HGB 10.6* 9.1* 10.7* 10.1*  HCT 32.7* 27.9* 33.5* 32.3*  PLT 274 259 334 277    COAGS: Recent Labs    02/25/17 0645 08/25/17 1009 10/19/17 0818  INR 1.05 1.0 1.02  APTT 28 29  --     BMP: Recent Labs    08/28/17 0209 09/14/17 1244 09/22/17 1042 10/19/17 0818  NA 137 143 140 141  K 4.0 5.0 4.8 4.4  CL 106 103 99 105  CO2 23 24 22 27   GLUCOSE 120* 132* 169* 109*  BUN 27* 22 35* 22*  CALCIUM 8.4* 9.2 9.0 8.9  CREATININE 1.42* 1.24 1.57* 1.37*  GFRNONAA 44* 53* 40* 46*  GFRAA 51* 61 46* 53*    LIVER FUNCTION TESTS: Recent Labs    01/11/17 1004  BILITOT 0.3  AST 11  ALT 7  ALKPHOS 78  PROT 6.1  ALBUMIN 4.0    TUMOR MARKERS: No results for input(s): AFPTM, CEA, CA199, CHROMGRNA in the last 8760 hours.  Assessment and Plan:  Hx L ICA angioplasty 2016 Known B MCA stenosis Asymptomatic But recent MRI does reveal progression of stenosis Scheduled now for cerebral arteriogram Risks and benefits of cerebral angiogram with intervention were discussed with  the patient including, but not limited to bleeding, infection, vascular injury, contrast induced renal failure, stroke or even death.  This interventional procedure involves the use of X-rays and because of the nature of the planned procedure, it is possible that we will have prolonged use of X-ray fluoroscopy.  Potential radiation risks to you include (but are not limited to) the following: - A slightly elevated risk for cancer  several years later in life. This risk is typically less than 0.5% percent. This risk is low in comparison to the normal incidence of human cancer, which is 33% for women and 50% for men according to the [place]. - Radiation induced injury can include skin redness, resembling a rash, tissue breakdown / ulcers and hair loss (which can be temporary or permanent).   The likelihood of either of these occurring depends on the difficulty of the procedure and whether you are sensitive to radiation due to previous procedures, disease, or genetic conditions.   IF your procedure requires a prolonged use of radiation, you will be notified and given written instructions for further action.  It is your responsibility to monitor the irradiated area for the 2 weeks following the procedure and to notify your physician if you are concerned that you have suffered a radiation induced injury.    All of the patient's questions were answered, patient is agreeable to proceed.  Consent signed and in chart.  Thank you for this interesting consult.  I greatly enjoyed meeting KEEVEN [name] and look forward to participating in their care.  A copy of this  report was sent to the requesting provider on this date.  Electronically Signed: Lavonia Drafts, PA-C 10/19/2017, 9:35 AM   I spent a total of  30 Minutes   in face to face in clinical consultation, greater than 50% of which was counseling/coordinating care for cerebral arteriogram

## 2017-10-19 NOTE — Sedation Documentation (Signed)
MD placing angioseal R groin

## 2017-10-20 ENCOUNTER — Encounter (HOSPITAL_COMMUNITY): Payer: Self-pay | Admitting: Interventional Radiology

## 2017-10-21 ENCOUNTER — Other Ambulatory Visit (HOSPITAL_COMMUNITY): Payer: Self-pay | Admitting: Interventional Radiology

## 2017-10-21 ENCOUNTER — Encounter (HOSPITAL_COMMUNITY): Payer: Self-pay | Admitting: Interventional Radiology

## 2017-10-21 DIAGNOSIS — D649 Anemia, unspecified: Secondary | ICD-10-CM | POA: Diagnosis not present

## 2017-10-21 DIAGNOSIS — R799 Abnormal finding of blood chemistry, unspecified: Secondary | ICD-10-CM | POA: Diagnosis not present

## 2017-10-21 DIAGNOSIS — I771 Stricture of artery: Secondary | ICD-10-CM

## 2017-10-22 NOTE — Progress Notes (Signed)
This encounter was created in error - please disregard.

## 2017-10-28 ENCOUNTER — Ambulatory Visit (INDEPENDENT_AMBULATORY_CARE_PROVIDER_SITE_OTHER): Payer: Medicare Other | Admitting: Cardiology

## 2017-10-28 ENCOUNTER — Encounter: Payer: Self-pay | Admitting: Cardiology

## 2017-10-28 VITALS — BP 150/79 | HR 61 | Ht 73.0 in | Wt 220.4 lb

## 2017-10-28 DIAGNOSIS — I10 Essential (primary) hypertension: Secondary | ICD-10-CM | POA: Diagnosis not present

## 2017-10-28 DIAGNOSIS — E785 Hyperlipidemia, unspecified: Secondary | ICD-10-CM

## 2017-10-28 DIAGNOSIS — E118 Type 2 diabetes mellitus with unspecified complications: Secondary | ICD-10-CM

## 2017-10-28 DIAGNOSIS — I4891 Unspecified atrial fibrillation: Secondary | ICD-10-CM | POA: Diagnosis not present

## 2017-10-28 DIAGNOSIS — E1169 Type 2 diabetes mellitus with other specified complication: Secondary | ICD-10-CM | POA: Diagnosis not present

## 2017-10-28 DIAGNOSIS — I200 Unstable angina: Secondary | ICD-10-CM | POA: Diagnosis not present

## 2017-10-28 DIAGNOSIS — I9789 Other postprocedural complications and disorders of the circulatory system, not elsewhere classified: Secondary | ICD-10-CM | POA: Diagnosis not present

## 2017-10-28 DIAGNOSIS — I25708 Atherosclerosis of coronary artery bypass graft(s), unspecified, with other forms of angina pectoris: Secondary | ICD-10-CM | POA: Diagnosis not present

## 2017-10-28 NOTE — Patient Instructions (Signed)
NO CHANGES WITH MEDICATIONS    Your physician recommends that you schedule a follow-up appointment in [place].    If you need a refill on your cardiac medications before your next appointment, please call your pharmacy.

## 2017-10-28 NOTE — Progress Notes (Signed)
PCP: Suzan Garibaldi, FNP  Clinic Note: Chief Complaint  Patient presents with  . Follow-up    edema much improved  . Coronary Artery Disease  . Congestive Heart Failure    HPI: [name] is a 84 y.o. male with a PMH below who presents today for six-month follow-up of CAD-CABG with non-STEMI and then also recent STEMI having had PCI to the SVG-RCA. History of stroke, diabetes, hypertension and hyperlipidemia.  I last saw [name] in December 2018 --he seemed to be doing pretty good from a cardiac standpoint.  Was doing chores around the house without any difficulty. [name] was seen twice in the interim since that visit by Almyra Deforest, PA.  Initial visit April 10 led to cardiac catheterization demonstrating no real significant change he said mid RCA being totally closed.  Recommendation was increased management.  He then saw him back post cath for lower extremity edema and was started on Lasix.  Recent Hospitalizations:   08/2017 - Cath 1. Severe 3-V CAD, Mid RCA is now occluded. Otherwise, there has been no significant change since 03/2016. 2. Widely patent LIMA-LAD. Patent SVG-rPDA. Patent SVG overlapping stents have minimal in-stent restenosis. 3. Inability to cross aortic valve from the left radial. 4. Systemic hypertension  Escalate medical therapy; will increase isosorbide mononitrate and hydralazine.   Cerebral Angio 6/4  1.Severe 90 % plus stenosis of Lt MCA M1 seg with Slow flow in the Lt MCA superior division .sig change from angio of 2016  2.. Previous LT ICA angioplasty of petrous seg wide open.   Studies Personally Reviewed - (if available, images/films reviewed: From Epic Chart or Care Everywhere)  None since last visit  Interval History: Marlin returns today overall relatively stable from a cardiac standpoint.  His swelling is notably improved since starting Lasix.  He is really not having any PND orthopnea.  He occasionally has somewhat slight  dizziness, and denies any headaches or blurred vision. He has not had any chest tightness or pressure with rest or exertion.  Is some baseline exertional dyspnea and but no real change.  No PND, orthopnea with controlled edema. No rapid heartbeats palpitations, no syncope/near syncope- -- TIA/CVA 6/4 -- see Cerebral Angio. A history of postop A. fib, as far as I can tell he has not had any further recurrences. No melena, hematuria, epistaxis.   ROS: A comprehensive was performed. Pertinent symptoms noted in history of present illness Review of Systems  Constitutional: Positive for malaise/fatigue (Somewhat below baseline energy, but not significant.- ).  HENT: Negative for nosebleeds.   Respiratory: Negative for cough, sputum production, shortness of breath and wheezing.   Gastrointestinal: Positive for heartburn. Negative for blood in stool and melena.  Genitourinary: Negative for hematuria.  Musculoskeletal: Positive for joint pain. Negative for falls.  Neurological: Positive for dizziness (Sometimes positional).       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  All other systems reviewed and are negative.  I have reviewed and (if needed) personally updated the patient's problem list, medications, allergies, past medical and surgical history, social and family history.   Past Medical History:  Diagnosis Date  . Abdominal aortic aneurysm ([place])    a. Aortic duplex 06/2014: mild aneurysmal dilatation of proximal abdominal aorta measuring 3.4x3.4cm. No sig change from 2012. F/u due 06/2016;   . AKI (acute kidney injury) ([place]) 03/28/2016  . Arthritis    "right knee; never bothered me" (03/26/2016)  .  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 CAD in native artery    a. NSTEMI 11/2010 - CABG x2(LIMA to LAD, SVG to PDA). b. NEG Lexi MV 10/24/13, EF 53%, no perfusion abnormality, septal and apical HK noted.  c. NSTEMI 05/2014 - s/p DES to SVG-RPDA 06/18/14 (Xience Alpine DES 3.0 x 18 mm -3.35 mm), EF 60-65; d. 03/2016 STEMI/PCI: LM nl, ost LAD 70%, mLCx 50%, pRCA 95% - mRCA 60%, dRPDA 70%, LIMA->LAD ok, o-p SVG->RPDA 100% (3.0x16 Promus DES overlapping prior stent).  . Diastolic dysfunction    a. 03/2016 Echo: EF 60-65%, no rwma, Gr1 DD, triv AI, Ao root 66mm, Asc Ao 53mm, triv MR.  . Dizziness    a. Carotid duplex 03/2014: mild fibrous plaque, no significant stenosis.  . Foley catheter in place    "been wearing it for a couple months now" (03/26/2016)  . HTN (hypertension)   . Hyperlipidemia LDL goal <70   . Non-STEMI (non-ST elevated myocardial infarction) ([place]) 11/2010   1. Ostial LAD 70% (to close to [facility] for PCI), subtotal occlusion of the RCA  . Postoperative atrial fibrillation ([place]) 11/2010   Post CABG, no sign recurrence  . Refusal of blood transfusions as patient is Jehovah's Witness   . 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 Servando Snare)  . Sleep apnea    Not on CPAP. (03/26/2016)  . ST elevation myocardial infarction (STEMI) of inferior wall ([place]) 04/05/2016   Occluded in-stent restenosis/thrombosis of SVG-RCA --> treated with overlapping Promus DES 3.0 mm 16 mm postdilated 3.0 mm).  . Stroke [facility]) 2014; 01/2015   a. 2014 with mild right hand weakness, nonhemorrhagic per pt.;; b. - PTA-Stent L ICA 95%   . Type II diabetes mellitus ([place])     Past Surgical History:  Procedure Laterality Date  . APPENDECTOMY    . CARDIAC CATHETERIZATION  12/11/2010   Dr. Chase Picket - subsequent cath - normal LV systolic function, no renal artery stenosis, severe 2-vessel disease with subtotaled RCA prox and distal 60% lesion and complex 70% area of narrowing of ostium of LAD  . CARDIAC CATHETERIZATION N/A 03/26/2016   Procedure: Left Heart Cath and Coronary Angiography;  Surgeon: Nelva Bush, MD;  Location: [place] CV [facility];  Service: Cardiovascular;  Laterality: N/A;  . CARDIAC  CATHETERIZATION N/A 03/26/2016   Procedure: Coronary Stent Intervention;  Surgeon: Nelva Bush, MD;  Location: [place] CV [facility];  Service: Cardiovascular: 100% In-stent thrombosis of pros SVG-RCA (Xience DES) --> treated with PromusDES 3.0 x 18 (3.3 mm)  . CARDIAC CATHETERIZATION N/A 03/26/2016   Procedure: Bypass Graft Angiography;  Surgeon: Nelva Bush, MD;  Location: [place] CV [facility];  Service: Cardiovascular;  Laterality: N/A;  . CORONARY ANGIOPLASTY WITH STENT PLACEMENT  06/18/2014   PCI to SVG-RPDA 06/18/14 (Xience Alpine DES 3.0 x 18 mm -3.35 mm),   . CORONARY ARTERY BYPASS GRAFT  12/15/2010   X2, Dr Servando Snare; LIMA to LAD, SVG to PDA;   . CORONARY/GRAFT ANGIOGRAPHY N/A 08/27/2017   Procedure: CORONARY/GRAFT ANGIOGRAPHY;  Surgeon: Nelva Bush, MD;  Location: [place] CV LAB;  Service: Cardiovascular;  Laterality: N/A;  . CYSTOSCOPY WITH URETHRAL DILATATION    . IR ANGIO INTRA EXTRACRAN SEL COM CAROTID INNOMINATE BILAT MOD SED  02/25/2017  . IR ANGIO INTRA EXTRACRAN SEL COM CAROTID INNOMINATE BILAT MOD SED  10/19/2017  . IR ANGIO VERTEBRAL SEL SUBCLAVIAN INNOMINATE UNI L MOD SED  02/25/2017  . IR ANGIO VERTEBRAL SEL SUBCLAVIAN INNOMINATE UNI R MOD SED  10/19/2017  . IR ANGIO VERTEBRAL SEL VERTEBRAL UNI L MOD SED  10/19/2017  . IR GENERIC HISTORICAL  01/21/2016   IR RADIOLOGIST EVAL & MGMT 01/21/2016 [facility]  . IR GENERIC HISTORICAL  02/03/2016   IR CATHETER TUBE CHANGE 02/03/2016 Marybelle Killings, MD [facility]-INTERV RAD  . LEFT HEART CATHETERIZATION WITH CORONARY ANGIOGRAM N/A 06/18/2014   Procedure: LEFT HEART CATHETERIZATION WITH CORONARY ANGIOGRAM;  Surgeon: Leonie Man, MD;  Location: [facility] CATH LAB;  Service: Cardiovascular;  -- severe disease of SVG-rPDA  . RADIOLOGY WITH ANESTHESIA N/A 01/24/2015   Procedure: STENT ASSISTED ANGIOPLASTY (RADIOLOGY WITH ANESTHESIA);  Surgeon: Luanne Bras, MD;  Location: [place];  Service: Radiology;  Laterality: N/A;  . TONSILLECTOMY    . TRANSTHORACIC  ECHOCARDIOGRAM  07/28/2012; 03/25/2015:   a. EF 55-60%, severe Conc LVH, Nl Systolic fxn, G1 DD,  Ao Sclerosis w/o stenosis; CRO PFO;; b. Moderate concentric LVH. EF 60-65%. No RWMA, Gr 1 DD. Mild LA dilation  . TRANSTHORACIC ECHOCARDIOGRAM  03/2016   Hypokinesis of the distal inferior wall. On her concentric hypertrophy. EF 60-65%.  GR 1 DD. Ascending aorta/root 45-46 mm.    Nov 2017: PCI to SVG-rPDA ISR/thrombosis - Promus Premier DES 3.0 x 16 (3.41mm)     Cath 08/2017 - Diagnostic Diagram: med Rx (now CTO of RCA)     Current Meds  Medication Sig  . amLODipine (NORVASC) 10 MG tablet Take 10 mg by mouth every evening.   Marland Kitchen aspirin 81 MG chewable tablet Chew 1 tablet (81 mg total) by mouth daily.  . clopidogrel (PLAVIX) 75 MG tablet Take 1 tablet (75 mg total) by mouth daily.  . furosemide (LASIX) 20 MG tablet Take 1 tablet (20 mg total) by mouth as needed. (Patient taking differently: Take 20 mg by mouth daily as needed for fluid. )  . glimepiride (AMARYL) 4 MG tablet Take 4 mg by mouth 2 (two) times daily with a meal.   . hydrALAZINE (APRESOLINE) 50 MG tablet Take 1.5 tablets (75 mg total) by mouth 3 (three) times daily.  . isosorbide mononitrate (IMDUR) 60 MG 24 hr tablet Take 1 tablet (60 mg total) by mouth daily.  Marland Kitchen labetalol (NORMODYNE) 200 MG tablet Take 200 mg by mouth every evening.   Marland Kitchen losartan-hydrochlorothiazide (HYZAAR) 100-25 MG per tablet Take 1 tablet by mouth every morning.   . metFORMIN (GLUCOPHAGE) 1000 MG tablet Take 1 tablet (1,000 mg total) by mouth 2 (two) times daily.  . nitroGLYCERIN (NITROSTAT) 0.4 MG SL tablet Place 1 tablet (0.4 mg total) under the tongue every 5 (five) minutes as needed for chest pain (up to 3 doses).  Marland Kitchen oxybutynin (DITROPAN) 5 MG tablet Take 5 mg by mouth daily as needed for bladder spasms.     Allergies  Allergen Reactions  . Other Other (See Comments)    No  BLOOD PRODUCTS - Pt is Jeohovah's Witness  . Atorvastatin Other (See Comments)     Myalgias  . Crestor [Rosuvastatin] Other (See Comments)    Myalgias   . Pravastatin Other (See Comments)    Myalgias  . Simvastatin Other (See Comments)    Myalgias    Social History   Tobacco Use  . Smoking status: Former Smoker    Packs/day: 0.33    Years: 10.00    Pack years: 3.30    Types: Cigarettes    Last attempt to quit: 1961  Years since quitting: 58.4  . Smokeless tobacco: Never Used  Substance Use Topics  . Alcohol use: No  . Drug use: No   Social History   Social History Narrative   Married father of one.  Previously uses stationary bike routinely.  Now reduced due to other social stressors.   Quit smoking 50 years ago.  Does not drink alcohol     family history includes Heart Problems (age of onset: 45) in his father.  Wt Readings from Last 3 Encounters:  10/28/17 220 lb 6.4 oz (100 kg)  10/19/17 220 lb (99.8 kg)  09/14/17 223 lb 9.6 oz (101.4 kg)    PHYSICAL EXAM BP (!) 150/79   Pulse 61   Ht 6\' 1"  (1.854 m)   Wt 220 lb 6.4 oz (100 kg)   BMI 29.08 kg/m   Physical Exam  Constitutional: He is oriented to person, place, and time. He appears well-developed and well-nourished. No distress.  Relatively healthy-appearing.  Well-groomed.  HENT:  Head: Normocephalic and atraumatic.  Eyes: Conjunctivae and EOM are normal. No scleral icterus.  Neck: Normal range of motion. Neck supple. No hepatojugular reflux and no JVD present. Carotid bruit is not present.  Cardiovascular: Normal rate, regular rhythm and normal pulses.  Occasional extrasystoles are present. PMI is not displaced (Difficult to palpate). Exam reveals distant heart sounds. Exam reveals no gallop and no friction rub.  Murmur heard.  Medium-pitched harsh crescendo-decrescendo early systolic murmur is present with a grade of 1/6 at the upper right sternal border radiating to the neck. No HJR  Pulmonary/Chest: Effort normal and breath sounds normal. No respiratory distress. He has no wheezes.  He has no rales.  Abdominal: Soft. Bowel sounds are normal. He exhibits no distension. There is no tenderness. There is no rebound.  Musculoskeletal: Normal range of motion. He exhibits no edema.  R hand weak with mild contractures & plegia post CVA - stable.  Neurological: He is alert and oriented to person, place, and time.  r hand paresis  Psychiatric: He has a normal mood and affect. His behavior is normal. Judgment and thought content normal.  Nursing note and vitals reviewed.   Adult ECG Report  Other studies Reviewed: Additional studies/ records that were reviewed today include:  Recent Labs:  n/a  Lab Results  Component Value Date   CHOL 134 01/11/2017   HDL 40 01/11/2017   LDLCALC 77 01/11/2017   TRIG 83 01/11/2017   CHOLHDL 3.4 01/11/2017   Lab Results  Component Value Date   CREATININE 1.37 (H) 10/19/2017   BUN 22 (H) 10/19/2017   NA 141 10/19/2017   K 4.4 10/19/2017   CL 105 10/19/2017   CO2 27 10/19/2017    ASSESSMENT / PLAN: Doing much better than he was last visit.  Edema is totally controlled.  Besides feeling his heart beat pulsating in his temple area, he really has no cardiac complaints.  Blood pressure high, I will try to convert him from labetalol to carvedilol 6.2 twice daily if it remains elevated.  Otherwise continue current dose of hydralazine-nitrate. Continue standing dose of Lasix with PRN dosing. Continue aspirin and Plavix. With diabetes and some heart failure would consider switching from sulfonylurea to Jardiance.  Not on statin.  With pretty well controlled lipids.  She will be due for follow-up now.  He was reluctant to take any medications.  Injection are not.  Intolerant to statins -could consider Zetia..  Problem List Items Addressed This Visit  Unstable angina [facility])    Cardiac catheterization revealed now occlusion of the RCA with no PCI options.  Medical management.  Currently asymptomatic.      Type II diabetes mellitus with  complication (HCC)   Postoperative atrial fibrillation ([place])    As far as I can tell, no breakthrough episodes.  However he has had several strokes and this may be where the strokes come from.  Could consider loop recorder.      Moderate essential hypertension (Chronic)   Hyperlipidemia associated with type 2 diabetes mellitus (HCC) (Chronic)   Coronary artery disease involving coronary bypass graft of native heart with other forms of angina pectoris (HCC) - Primary (Chronic)    Pretty much stable findings on most recent cath.  He is on good regimen.  Would like to eventually converted from labetalol to carvedilol, allowing some mild permissive hypertension for dizziness.         Current medicines are reviewed at length with the patient today. (+/- concerns) n/a The following changes have been made: n/a  Patient Instructions  NO CHANGES WITH MEDICATIONS    Your physician recommends that you schedule a follow-up appointment in [place].    If you need a refill on your cardiac medications before your next appointment, please call your pharmacy.    Studies Ordered:   No orders of the defined types were placed in this encounter.     Glenetta Hew, M.D., M.S. Interventional Cardiologist   Pager # [phone] Phone # [phone] [address] [place], [place] [address]

## 2017-10-30 ENCOUNTER — Encounter: Payer: Self-pay | Admitting: Cardiology

## 2017-10-31 NOTE — Assessment & Plan Note (Signed)
Pretty much stable findings on most recent cath.  He is on good regimen.  Would like to eventually converted from labetalol to carvedilol, allowing some mild permissive hypertension for dizziness.

## 2017-10-31 NOTE — Assessment & Plan Note (Signed)
As far as I can tell, no breakthrough episodes.  However he has had several strokes and this may be where the strokes come from.  Could consider loop recorder.

## 2017-10-31 NOTE — Assessment & Plan Note (Signed)
Cardiac catheterization revealed now occlusion of the RCA with no PCI options.  Medical management.  Currently asymptomatic.

## 2017-11-02 DIAGNOSIS — D692 Other nonthrombocytopenic purpura: Secondary | ICD-10-CM | POA: Diagnosis not present

## 2017-11-02 DIAGNOSIS — Z85828 Personal history of other malignant neoplasm of skin: Secondary | ICD-10-CM | POA: Diagnosis not present

## 2017-11-02 DIAGNOSIS — D1801 Hemangioma of skin and subcutaneous tissue: Secondary | ICD-10-CM | POA: Diagnosis not present

## 2017-11-02 DIAGNOSIS — R3914 Feeling of incomplete bladder emptying: Secondary | ICD-10-CM | POA: Diagnosis not present

## 2017-11-02 DIAGNOSIS — L570 Actinic keratosis: Secondary | ICD-10-CM | POA: Diagnosis not present

## 2017-11-02 DIAGNOSIS — L723 Sebaceous cyst: Secondary | ICD-10-CM | POA: Diagnosis not present

## 2017-11-02 DIAGNOSIS — L821 Other seborrheic keratosis: Secondary | ICD-10-CM | POA: Diagnosis not present

## 2017-11-09 ENCOUNTER — Ambulatory Visit (HOSPITAL_COMMUNITY)
Admission: RE | Admit: 2017-11-09 | Discharge: 2017-11-09 | Disposition: A | Discharge status: Home / Self Care | Payer: Medicare Other | Source: Ambulatory Visit | Attending: Interventional Radiology | Admitting: Interventional Radiology

## 2017-11-09 ENCOUNTER — Other Ambulatory Visit (HOSPITAL_COMMUNITY): Payer: Self-pay | Admitting: Interventional Radiology

## 2017-11-09 DIAGNOSIS — I771 Stricture of artery: Secondary | ICD-10-CM

## 2017-11-09 DIAGNOSIS — Z7902 Long term (current) use of antithrombotics/antiplatelets: Secondary | ICD-10-CM | POA: Diagnosis not present

## 2017-11-09 DIAGNOSIS — I6521 Occlusion and stenosis of right carotid artery: Secondary | ICD-10-CM | POA: Diagnosis not present

## 2017-11-09 DIAGNOSIS — I6602 Occlusion and stenosis of left middle cerebral artery: Secondary | ICD-10-CM | POA: Diagnosis not present

## 2017-11-09 HISTORY — PX: IR RADIOLOGIST EVAL & MGMT: IMG5224

## 2017-11-12 ENCOUNTER — Encounter (HOSPITAL_COMMUNITY): Payer: Self-pay | Admitting: Interventional Radiology

## 2017-11-23 ENCOUNTER — Telehealth: Payer: Self-pay | Admitting: Cardiology

## 2017-11-23 DIAGNOSIS — R195 Other fecal abnormalities: Secondary | ICD-10-CM | POA: Diagnosis not present

## 2017-11-23 DIAGNOSIS — D649 Anemia, unspecified: Secondary | ICD-10-CM | POA: Diagnosis not present

## 2017-11-23 NOTE — Telephone Encounter (Signed)
    [facility] Pre-operative Risk Assessment    Request for surgical clearance:  1. What type of surgery is being performed? Colonoscopy & EGD   2. When is this surgery scheduled? TBD   3. What type of clearance is required (medical clearance vs. Pharmacy clearance to hold med vs. Both)? Both   4. Are there any medications that need to be held prior to surgery and how long? Plavix - 5 days prior   5. Practice name and name of physician performing surgery? Dr. Nehemiah Settle @ [facility]   6. What is your office phone number [phone]    7.   What is your office fax number [phone]  8.   Anesthesia type (None, local, MAC, general) ? Not specified   ** per clearance form: handwritten note stating "patient reports beginning of stroke when stopping plavix previously"   Sheral Apley M 11/23/2017, 3:19 PM  _________________________________________________________________   (provider comments below)

## 2017-11-25 NOTE — Telephone Encounter (Signed)
Dr Ellyn Hack, pt has hx of 08/2017  Cath: Severe 3-V CAD, Mid RCA is now occluded. Otherwise, there has been no significant change since 03/2016- Widely patent LIMA-LAD. Patent SVG-rPDA. Patent SVG overlapping stents have minimal in-stent restenosis.  Hx afib and several strokes, not anticoagulated.  ** per clearance form: handwritten note stating "patient reports beginning of stroke when stopping plavix previously"  Can Plavix be held for 5 days prior to Colonoscopy and EGD?  Please route response back to CV DIV PREOP  Thanks

## 2017-11-25 NOTE — Telephone Encounter (Signed)
Yes okay to stop Plavix 5 days preop colonoscopy.  Restart 2 days post.  Glenetta Hew, MD

## 2017-11-26 DIAGNOSIS — R52 Pain, unspecified: Secondary | ICD-10-CM | POA: Diagnosis not present

## 2017-11-26 DIAGNOSIS — S65590A Other specified injury of blood vessel of right index finger, initial encounter: Secondary | ICD-10-CM | POA: Diagnosis not present

## 2017-11-26 DIAGNOSIS — S61210A Laceration without foreign body of right index finger without damage to nail, initial encounter: Secondary | ICD-10-CM | POA: Diagnosis not present

## 2017-11-26 NOTE — Telephone Encounter (Signed)
   Primary Cardiologist: Glenetta Hew, MD  Chart reviewed as part of pre-operative protocol coverage. Given past medical history and time since last visit, based on ACC/AHA guidelines, [name] would be at acceptable risk for the planned procedure without further cardiovascular testing. He has CAD and should have close hemodynamic monitoring surrounding procedure. He should continue beta blocker and statin.   Per Dr. Ellyn Hack okay to stop Plavix 5 days preop colonoscopy.  Restart 2 days post.  I will route this recommendation to the requesting party via [facility] fax function and remove from pre-op pool.  Please call with questions.  Daune Perch, NP 11/26/2017, 9:16 AM

## 2017-12-13 ENCOUNTER — Emergency Department (HOSPITAL_COMMUNITY)
Admission: EM | Admit: 2017-12-13 | Discharge: 2017-12-13 | Disposition: A | Discharge status: Home / Self Care | Payer: Medicare Other | Attending: Emergency Medicine | Admitting: Emergency Medicine

## 2017-12-13 ENCOUNTER — Other Ambulatory Visit: Payer: Self-pay

## 2017-12-13 ENCOUNTER — Telehealth: Payer: Self-pay | Admitting: Cardiology

## 2017-12-13 ENCOUNTER — Emergency Department (HOSPITAL_COMMUNITY): Payer: Medicare Other

## 2017-12-13 ENCOUNTER — Encounter (HOSPITAL_COMMUNITY): Payer: Self-pay | Admitting: Emergency Medicine

## 2017-12-13 DIAGNOSIS — R531 Weakness: Secondary | ICD-10-CM

## 2017-12-13 DIAGNOSIS — Z79899 Other long term (current) drug therapy: Secondary | ICD-10-CM | POA: Diagnosis not present

## 2017-12-13 DIAGNOSIS — R5383 Other fatigue: Secondary | ICD-10-CM

## 2017-12-13 DIAGNOSIS — N3000 Acute cystitis without hematuria: Secondary | ICD-10-CM

## 2017-12-13 DIAGNOSIS — R51 Headache: Secondary | ICD-10-CM | POA: Diagnosis not present

## 2017-12-13 DIAGNOSIS — R079 Chest pain, unspecified: Secondary | ICD-10-CM | POA: Diagnosis not present

## 2017-12-13 DIAGNOSIS — R42 Dizziness and giddiness: Secondary | ICD-10-CM | POA: Diagnosis not present

## 2017-12-13 DIAGNOSIS — Z8673 Personal history of transient ischemic attack (TIA), and cerebral infarction without residual deficits: Secondary | ICD-10-CM | POA: Diagnosis not present

## 2017-12-13 DIAGNOSIS — I2510 Atherosclerotic heart disease of native coronary artery without angina pectoris: Secondary | ICD-10-CM | POA: Insufficient documentation

## 2017-12-13 DIAGNOSIS — E119 Type 2 diabetes mellitus without complications: Secondary | ICD-10-CM | POA: Insufficient documentation

## 2017-12-13 DIAGNOSIS — D649 Anemia, unspecified: Secondary | ICD-10-CM | POA: Diagnosis not present

## 2017-12-13 DIAGNOSIS — I10 Essential (primary) hypertension: Secondary | ICD-10-CM | POA: Insufficient documentation

## 2017-12-13 DIAGNOSIS — R0602 Shortness of breath: Secondary | ICD-10-CM | POA: Diagnosis not present

## 2017-12-13 DIAGNOSIS — I442 Atrioventricular block, complete: Secondary | ICD-10-CM | POA: Diagnosis not present

## 2017-12-13 LAB — HEPATIC FUNCTION PANEL
ALBUMIN: 3.7 g/dL (ref 3.5–5.0)
ALK PHOS: 57 U/L (ref 38–126)
ALT: 11 U/L (ref 0–44)
AST: 15 U/L (ref 15–41)
Bilirubin, Direct: <0.1 mg/dL (ref 0.0–0.2)
TOTAL PROTEIN: 6.3 g/dL — AB (ref 6.5–8.1)
Total Bilirubin: 0.4 mg/dL (ref 0.3–1.2)

## 2017-12-13 LAB — URINALYSIS, ROUTINE W REFLEX MICROSCOPIC
Bilirubin Urine: NEGATIVE
Glucose, UA: NEGATIVE mg/dL
Ketones, ur: NEGATIVE mg/dL
Nitrite: POSITIVE — AB
PROTEIN: 100 mg/dL — AB
SPECIFIC GRAVITY, URINE: 1.013 (ref 1.005–1.030)
pH: 6 (ref 5.0–8.0)

## 2017-12-13 LAB — TROPONIN I

## 2017-12-13 LAB — BASIC METABOLIC PANEL
ANION GAP: 9 (ref 5–15)
BUN: 26 mg/dL — ABNORMAL HIGH (ref 8–23)
CALCIUM: 9 mg/dL (ref 8.9–10.3)
CO2: 22 mmol/L (ref 22–32)
Chloride: 110 mmol/L (ref 98–111)
Creatinine, Ser: 1.46 mg/dL — ABNORMAL HIGH (ref 0.61–1.24)
GFR calc non Af Amer: 42 mL/min — ABNORMAL LOW (ref >60)
GFR, EST AFRICAN AMERICAN: 49 mL/min — AB (ref >60)
GLUCOSE: 165 mg/dL — AB (ref 70–99)
POTASSIUM: 4.7 mmol/L (ref 3.5–5.1)
Sodium: 141 mmol/L (ref 135–145)

## 2017-12-13 LAB — CBC
HCT: 32.3 % — ABNORMAL LOW (ref 39.0–52.0)
HEMOGLOBIN: 10.2 g/dL — AB (ref 13.0–17.0)
MCH: 29.4 pg (ref 26.0–34.0)
MCHC: 31.6 g/dL (ref 30.0–36.0)
MCV: 93.1 fL (ref 78.0–100.0)
Platelets: 268 10*3/uL (ref 150–400)
RBC: 3.47 MIL/uL — AB (ref 4.22–5.81)
RDW: 16.4 % — ABNORMAL HIGH (ref 11.5–15.5)
WBC: 5.4 10*3/uL (ref 4.0–10.5)

## 2017-12-13 LAB — TSH: TSH: 0.108 u[IU]/mL — ABNORMAL LOW (ref 0.350–4.500)

## 2017-12-13 LAB — BRAIN NATRIURETIC PEPTIDE: B NATRIURETIC PEPTIDE 5: 190.3 pg/mL — AB (ref 0.0–100.0)

## 2017-12-13 MED ORDER — CEPHALEXIN 500 MG PO CAPS: 500.0000 mg | ORAL_CAPSULE | Freq: Three times a day (TID) | ORAL | 0 refills | Status: DC

## 2017-12-13 MED ORDER — SODIUM CHLORIDE 0.9 % IV SOLN
2.0000 g | Freq: Once | INTRAVENOUS | Status: AC
  Administered 2017-12-13: 2 g via INTRAVENOUS
  Filled 2017-12-13: qty 20

## 2017-12-13 NOTE — ED Provider Notes (Signed)
[facility] Emergency Department Provider Note MRN:  [id number]  Arrival date & time: 12/14/17     Chief Complaint   Weakness   History of Present Illness   [name] AHLERS is a 84 y.o. year-old male with a history of CAD, stroke, GI bleeding presenting to the ED with chief complaint of fatigue.  Patient explains that he has felt significant generalized weakness and fatigue for the past 3 days.  Normally can get around well without significant fatigue or weakness.  Denies specific weakness in arm or leg.  Yesterday endorsing 4 separate episodes of mild left-sided chest pain that quickly resolved.  Yesterday also endorsing mild right-sided headache, that is persisted today.  States that he never has headaches, last headache when he was a teenager.  Denies any fevers or chills, no shortness of breath, no abdominal pain, no dysuria.  Headache is mild, constant, no exacerbating or alleviating factors.  Review of Systems  A complete 10 system review of systems was obtained and all systems are negative except as noted in the HPI and PMH.   Patient's Health History    Past Medical History:  Diagnosis Date  . Abdominal aortic aneurysm ([place])    a. Aortic duplex 06/2014: mild aneurysmal dilatation of proximal abdominal aorta measuring 3.4x3.4cm. No sig change from 2012. F/u due 06/2016;   . AKI (acute kidney injury) ([place]) 03/28/2016  . Arthritis    "right knee; never bothered me" (03/26/2016)  . 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 CAD in native artery    a. NSTEMI 11/2010 - CABG x2(LIMA to LAD, SVG to PDA). b. NEG Lexi MV 10/24/13, EF 53%, no perfusion abnormality, septal and apical HK noted. c. NSTEMI 05/2014 - s/p DES to SVG-RPDA 06/18/14 (Xience Alpine DES 3.0 x 18 mm -3.35 mm), EF 60-65; d. 03/2016 STEMI/PCI: LM nl, ost LAD 70%, mLCx 50%, pRCA 95% - mRCA 60%, dRPDA 70%, LIMA->LAD ok, o-p SVG->RPDA 100% (3.0x16 Promus DES overlapping prior  stent).  . Diastolic dysfunction    a. 03/2016 Echo: EF 60-65%, no rwma, Gr1 DD, triv AI, Ao root 27mm, Asc Ao 65mm, triv MR.  . Dizziness    a. Carotid duplex 03/2014: mild fibrous plaque, no significant stenosis.  . Foley catheter in place    "been wearing it for a couple months now" (03/26/2016)  . HTN (hypertension)   . Hyperlipidemia LDL goal <70   . Non-STEMI (non-ST elevated myocardial infarction) ([place]) 11/2010   1. Ostial LAD 70% (to close to [facility] for PCI), subtotal occlusion of the RCA  . Postoperative atrial fibrillation ([place]) 11/2010   Post CABG, no sign recurrence  . Refusal of blood transfusions as patient is Jehov[employer]'s Witness   . 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 Servando Snare)  . Sleep apnea    Not on CPAP. (03/26/2016)  . ST elevation myocardial infarction (STEMI) of inferior wall ([place]) 04/05/2016   Occluded in-stent restenosis/thrombosis of SVG-RCA --> treated with overlapping Promus DES 3.0 mm 16 mm postdilated 3.0 mm).  . Stroke [facility]) 2014; 01/2015   a. 2014 with mild right hand weakness, nonhemorrhagic per pt.;; b. - PTA-Stent L ICA 95%   . Type II diabetes mellitus ([place])     Past Surgical History:  Procedure Laterality Date  . APPENDECTOMY    . CARDIAC CATHETERIZATION  12/11/2010   Dr. Chase Picket - subsequent cath - normal LV systolic function,  no renal artery stenosis, severe 2-vessel disease with subtotaled RCA prox and distal 60% lesion and complex 70% area of narrowing of ostium of LAD  . CARDIAC CATHETERIZATION N/A 03/26/2016   Procedure: Left Heart Cath and Coronary Angiography;  Surgeon: Nelva Bush, MD;  Location: [place] CV LAB;  Service: Cardiovascular;  Laterality: N/A;  . CARDIAC CATHETERIZATION N/A 03/26/2016   Procedure: Coronary Stent Intervention;  Surgeon: Nelva Bush, MD;  Location: [place] CV LAB;  Service: Cardiovascular: 100% In-stent thrombosis of pros SVG-RCA (Xience DES) --> treated with PromusDES 3.0 x 18 (3.3 mm)    . CARDIAC CATHETERIZATION N/A 03/26/2016   Procedure: Bypass Graft Angiography;  Surgeon: Nelva Bush, MD;  Location: [place] CV LAB;  Service: Cardiovascular;  Laterality: N/A;  . CORONARY ANGIOPLASTY WITH STENT PLACEMENT  06/18/2014   PCI to SVG-RPDA 06/18/14 (Xience Alpine DES 3.0 x 18 mm -3.35 mm),   . CORONARY ARTERY BYPASS GRAFT  12/15/2010   X2, Dr Servando Snare; LIMA to LAD, SVG to PDA;   . CORONARY/GRAFT ANGIOGRAPHY N/A 08/27/2017   Procedure: CORONARY/GRAFT ANGIOGRAPHY;  Surgeon: Nelva Bush, MD;  Location: [place] CV LAB;  Service: Cardiovascular;  Laterality: N/A;  . CYSTOSCOPY WITH URETHRAL DILATATION    . IR ANGIO INTRA EXTRACRAN SEL COM CAROTID INNOMINATE BILAT MOD SED  02/25/2017  . IR ANGIO INTRA EXTRACRAN SEL COM CAROTID INNOMINATE BILAT MOD SED  10/19/2017  . IR ANGIO VERTEBRAL SEL SUBCLAVIAN INNOMINATE UNI L MOD SED  02/25/2017  . IR ANGIO VERTEBRAL SEL SUBCLAVIAN INNOMINATE UNI R MOD SED  10/19/2017  . IR ANGIO VERTEBRAL SEL VERTEBRAL UNI L MOD SED  10/19/2017  . IR GENERIC HISTORICAL  01/21/2016   IR RADIOLOGIST EVAL & MGMT 01/21/2016 [facility]  . IR GENERIC HISTORICAL  02/03/2016   IR CATHETER TUBE CHANGE 02/03/2016 Marybelle Killings, MD [facility]INTER[facility]  . IR RADIOLOGIST EVAL & MGMT  11/09/2017  . LEFT HEART CATHETERIZATION WITH CORONARY ANGIOGRAM N/A 06/18/2014   Procedure: LEFT HEART CATHETERIZATION WITH CORONARY ANGIOGRAM;  Surgeon: Leonie Man, MD;  Location: [facility] CATH LAB;  Service: Cardiovascular;  -- severe disease of SVG-rPDA  . RADIOLOGY WITH ANESTHESIA N/A 01/24/2015   Procedure: STENT ASSISTED ANGIOPLASTY (RADIOLOGY WITH ANESTHESIA);  Surgeon: Luanne Bras, MD;  Location: [place];  Service: Radiology;  Laterality: N/A;  . TONSILLECTOMY    . TRANSTHORACIC ECHOCARDIOGRAM  07/28/2012; 03/25/2015:   a. EF 55-60%, severe Conc LVH, Nl Systolic fxn, G1 DD,  Ao Sclerosis w/o stenosis; CRO PFO;; b. Moderate concentric LVH. EF 60-65%. No RWMA, Gr 1 DD. Mild LA dilation  .  TRANSTHORACIC ECHOCARDIOGRAM  03/2016   Hypokinesis of the distal inferior wall. On her concentric hypertrophy. EF 60-65%.  GR 1 DD. Ascending aorta/root 45-46 mm.    Family History  Problem Relation Age of Onset  . Heart Problems Father 43    Social History   Socioeconomic History  . Marital status: Married    Spouse name: [name]  . Number of children: 1  . Years of education: Not on file  . Highest education level: Not on file  Occupational History    Employer: RETIRED  Social Needs  . Financial resource strain: Not on file  . Food insecurity:    Worry: Not on file    Inability: Not on file  . Transportation needs:    Medical: Not on file    Non-medical: Not on file  Tobacco Use  . Smoking status: Former Smoker    Packs/day:  0.33    Years: 10.00    Pack years: 3.30    Types: Cigarettes    Last attempt to quit: 1961    Years since quitting: 58.6  . Smokeless tobacco: Never Used  Substance and Sexual Activity  . Alcohol use: No  . Drug use: No  . Sexual activity: Never  Lifestyle  . Physical activity:    Days per week: Not on file    Minutes per session: Not on file  . Stress: Not on file  Relationships  . Social connections:    Talks on phone: Not on file    Gets together: Not on file    Attends religious service: Not on file    Active member of club or organization: Not on file    Attends meetings of clubs or organizations: Not on file    Relationship status: Not on file  . Intimate partner violence:    Fear of current or ex partner: Not on file    Emotionally abused: Not on file    Physically abused: Not on file    Forced sexual activity: Not on file  Other Topics Concern  . Not on file  Social History Narrative   Married father of one.  Previously uses stationary bike routinely.  Now reduced due to other social stressors.   Quit smoking 50 years ago.  Does not drink alcohol     Physical Exam  Vital Signs and Nursing Notes reviewed Vitals:   12/13/17  1600 12/13/17 1700  BP: (!) 177/78 (!) 192/85  Pulse: (!) 56 (!) 53  Resp: 14 13  Temp:    SpO2: 99% 99%    CONSTITUTIONAL: Well-appearing, NAD NEURO:  Alert and oriented x 3, no focal deficits EYES:  eyes equal and reactive ENT/NECK:  no LAD, no JVD CARDIO: Regular rate, well-perfused, normal S1 and S2 PULM:  CTAB no wheezing or rhonchi GI/GU:  normal bowel sounds, non-distended, non-tender MSK/SPINE:  No gross deformities, 1+ pitting edema to bilateral lower extremities SKIN:  no rash, atraumatic PSYCH:  Appropriate speech and behavior  Diagnostic and Interventional Summary    EKG Interpretation  Date/Time:  Monday December 13 2017 11:47:14 EDT Ventricular Rate:  66 PR Interval:  228 QRS Duration: 88 QT Interval:  398 QTC Calculation: 417 R Axis:   -22 Text Interpretation:  Sinus rhythm with 1st degree A-V block Possible Lateral infarct , age undetermined Inferior infarct , age undetermined Abnormal ECG Confirmed by Gerlene Fee [phone]) on 12/13/2017 2:06:30 PM      Labs Reviewed  BASIC METABOLIC PANEL - Abnormal; Notable for the following components:      Result Value   Glucose, Bld 165 (*)    BUN 26 (*)    Creatinine, Ser 1.46 (*)    GFR calc non Af Amer 42 (*)    GFR calc Af Amer 49 (*)    All other components within normal limits  CBC - Abnormal; Notable for the following components:   RBC 3.47 (*)    Hemoglobin 10.2 (*)    HCT 32.3 (*)    RDW 16.4 (*)    All other components within normal limits  URINALYSIS, ROUTINE W REFLEX MICROSCOPIC - Abnormal; Notable for the following components:   APPearance TURBID (*)    Hgb urine dipstick MODERATE (*)    Protein, ur 100 (*)    Nitrite POSITIVE (*)    Leukocytes, UA LARGE (*)    WBC, UA >50 (*)  Bacteria, UA MANY (*)    All other components within normal limits  HEPATIC FUNCTION PANEL - Abnormal; Notable for the following components:   Total Protein 6.3 (*)    All other components within normal limits  TSH -  Abnormal; Notable for the following components:   TSH 0.108 (*)    All other components within normal limits  BRAIN NATRIURETIC PEPTIDE - Abnormal; Notable for the following components:   B Natriuretic Peptide 190.3 (*)    All other components within normal limits  TROPONIN I    DG Chest 2 View  Final Result    CT Head Wo Contrast  Final Result      Medications  cefTRIAXone (ROCEPHIN) 2 g in sodium chloride 0.9 % 100 mL IVPB (0 g Intravenous Stopped 12/13/17 1737)     Procedures Critical Care  ED Course and Medical Decision Making  I have reviewed the triage vital signs and the nursing notes.  Pertinent labs & imaging results that were available during my care of the patient were reviewed by me and considered in my medical decision making (see below for details). Clinical Course as of Dec 14 5  Mon Dec 14, 5522  [id number] 84 year old male with a history of CAD, occult GI bleeding, stroke presenting with chief complaint of generalized weakness for 3 days.  Also endorsing intermittent mild chest pain yesterday, as well as headache yesterday and today.  Does not get headaches.  Considering subacute stroke, occult pneumonia, metabolic disarray, UTI, CHF exacerbation.  Will obtain labs, urine, chest x-ray, CT head.  UA consistent with infection, given IV ceftriaxone here in the ED.   [MB]  1751 Work-up largely unremarkable with the exception of a UTI.  Patient feels well and would like to be discharged home on antibiotics.  Has good follow-up with PCP, will call soon for an appointment.  Prescription for Keflex.After the discussed management above, the patient was determined to be safe for discharge.  The patient was in agreement with this plan and all questions regarding their care were answered.  ED return precautions were discussed and the patient will return to the ED with any significant worsening of condition.   [MB]    Clinical Course User Index [MB] Sedonia Small Barth Kirks, MD      Barth Kirks. Sedonia Small, [place] [email or web address]  Final Clinical Impressions(s) / ED Diagnoses     ICD-10-CM   1. Acute cystitis without hematuria N30.00   2. Fatigue R53.83 DG Chest 2 View    DG Chest 2 View  3. Weakness R53.1     ED Discharge Orders        Ordered    cephALEXin (KEFLEX) 500 MG capsule  3 times daily     12/13/17 1752         Maudie Flakes, MD 12/14/17 [phone]

## 2017-12-13 NOTE — ED Notes (Signed)
Pt verbalized understanding of discharge instructions and denies any further questions at this time.   

## 2017-12-13 NOTE — Telephone Encounter (Signed)
Spoke with NP Suzan Garibaldi on the phone - patient complaining of chest pain and fatigue, looks to be in moderate distress, pale - I suggested the patient be sent to the [facility] ER for further evaluation.  Pixie Casino, MD, [facility], [place] Director of the Advanced Lipid Disorders &  Cardiovascular Risk Reduction Clinic Diplomate of the American Board of Clinical Lipidology Attending Cardiologist  Direct Dial: [phone]  Fax: [phone]  Website:  [email or web address]

## 2017-12-13 NOTE — ED Notes (Signed)
Eating crackers and drinking.

## 2017-12-13 NOTE — Telephone Encounter (Signed)
NEW MESSAGE          Pt c/o of Chest Pain: 1. Are you having CP right now? No 2. Are you experiencing any other symptoms (ex. SOB, nausea, vomiting, sweating)? SOB, Sweating 3. How long have you been experiencing CP? Since the weekend  4. Is your CP continuous or coming and going? Coming and going over 4 hour period 5. Have you taken Nitroglycerin? No  Patient walked in to [facility] SOB, chest pain, patient is diurettic,  Chest pain over the weekend

## 2017-12-13 NOTE — ED Triage Notes (Signed)
Patient complains of fatigue for that began shortly after he started trying to do his chores on Friday. States at the time his arms felt very heavy, denies heaviness at this time. Denies shortness of breath, nausea, and pain at this time.

## 2017-12-13 NOTE — ED Notes (Signed)
ED Provider at bedside. 

## 2017-12-16 ENCOUNTER — Ambulatory Visit (INDEPENDENT_AMBULATORY_CARE_PROVIDER_SITE_OTHER): Payer: Medicare Other | Admitting: Cardiology

## 2017-12-16 ENCOUNTER — Encounter: Payer: Self-pay | Admitting: Cardiology

## 2017-12-16 VITALS — BP 179/78 | HR 53 | Ht 73.0 in | Wt 217.8 lb

## 2017-12-16 DIAGNOSIS — I6522 Occlusion and stenosis of left carotid artery: Secondary | ICD-10-CM | POA: Diagnosis not present

## 2017-12-16 DIAGNOSIS — I10 Essential (primary) hypertension: Secondary | ICD-10-CM

## 2017-12-16 DIAGNOSIS — I25119 Atherosclerotic heart disease of native coronary artery with unspecified angina pectoris: Secondary | ICD-10-CM | POA: Diagnosis not present

## 2017-12-16 DIAGNOSIS — E1169 Type 2 diabetes mellitus with other specified complication: Secondary | ICD-10-CM

## 2017-12-16 DIAGNOSIS — I714 Abdominal aortic aneurysm, without rupture, unspecified: Secondary | ICD-10-CM

## 2017-12-16 DIAGNOSIS — I200 Unstable angina: Secondary | ICD-10-CM | POA: Diagnosis not present

## 2017-12-16 DIAGNOSIS — Z9861 Coronary angioplasty status: Secondary | ICD-10-CM | POA: Diagnosis not present

## 2017-12-16 DIAGNOSIS — E785 Hyperlipidemia, unspecified: Secondary | ICD-10-CM

## 2017-12-16 DIAGNOSIS — I2510 Atherosclerotic heart disease of native coronary artery without angina pectoris: Secondary | ICD-10-CM

## 2017-12-16 MED ORDER — HYDRALAZINE HCL 100 MG PO TABS: 100.0000 mg | ORAL_TABLET | Freq: Three times a day (TID) | ORAL | 3 refills | Status: DC

## 2017-12-16 MED ORDER — LOSARTAN POTASSIUM-HCTZ 100-25 MG PO TABS: 1.0000 | ORAL_TABLET | Freq: Every evening | ORAL | 3 refills | Status: DC

## 2017-12-16 NOTE — Progress Notes (Signed)
PCP: [name] Garibaldi, FNP  Clinic Note: No chief complaint on file.   HPI: [name] is a 84 y.o. male with a PMH below who presents today for 9-month follow-up of CAD-CABG with non-STEMI (11/2010), PCI-SVG-RCA in 2016 & recent STEMI in 03/2016 - ISR of SVG-RCA.  Apparently this is in response to his ER visit for UTI with a side complaint of chest pain.  Additional PMH: stroke, diabetes, hypertension and hyperlipidemia & AAA (brief episode of Post-op Afib after CABG), OSA-CPAP.  [name] was just seen on June 13.  He was relatively stable from prior standpoint.  Swelling is notably improved since starting Lasix. Patient has had cerebral angiogram in response to TIA/CVA - 90% stenosis noted in L MCA - supposed to f/u with Dr. Kathee [name]. --> planned to convert from labetalol to Carvedilol if BP still up.   Recent Hospitalizations:   ER visit 12/13/17 - acute cystitis without hematuria; but he did note having some chest pain, but this resolved.  Studies Personally Reviewed - (if available, images/films reviewed: From Epic Chart or Care Everywhere)  None since last visit  Interval History: [name] returns today really fine.  He has not had any chest pain that he remembers.  He apparently findings of chest pain in the emergency room but has not had any stents.  His swelling is well controlled.  He says it is been feeling generally poor emergency room.  Much better since starting on antibiotics.  No real dyspnea with rest or exertion.  No anginal symptoms with rest or exertion.  No PND, orthopnea. No rapid palpitations heartbeats.  No syncope/near syncope or TIA/amaurosis fugax...  Especially in light of elevated blood pressure.   Denies any headaches with blurred vision, dizziness -especially in light of elevated blood pressure today..   ROS: A comprehensive was performed. Pertinent symptoms noted in history of present illness Review of Systems  Constitutional: Positive for  malaise/fatigue (Much better since being on antibiotics).  HENT: Negative for nosebleeds.   Respiratory: Negative for cough, sputum production, shortness of breath and wheezing.   Gastrointestinal: Positive for heartburn. Negative for blood in stool and melena.  Genitourinary: Positive for hematuria (While in the emergency room.).  Musculoskeletal: Positive for joint pain. Negative for falls.  Neurological: Positive for dizziness (Sometimes positional).       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  All other systems reviewed and are negative.  I have reviewed and (if needed) personally updated the patient's problem list, medications, allergies, past medical and surgical history, social and family history.   Past Medical History:  Diagnosis Date  . Abdominal aortic aneurysm ([place])    a. Aortic duplex 06/2014: mild aneurysmal dilatation of proximal abdominal aorta measuring 3.4x3.4cm. No sig change from 2012. F/u due 06/2016;   . AKI (acute kidney injury) ([place]) 03/28/2016  . Arthritis    "right knee; never bothered me" (03/26/2016)  . 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 CAD in native artery    a. NSTEMI 11/2010 - CABG x2(LIMA to LAD, SVG to PDA). b. NEG Lexi MV 10/24/13, EF 53%, no perfusion abnormality, septal and apical HK noted. c. NSTEMI 05/2014 - s/p DES to SVG-RPDA 06/18/14 (Xience Alpine DES 3.0 x 18 mm -3.35 mm), EF 60-65; d. 03/2016 STEMI/PCI: LM nl, ost LAD 70%, mLCx 50%, pRCA 95% - mRCA 60%, dRPDA 70%, LIMA->LAD ok, o-p SVG->RPDA 100% (3.0x16 Promus DES  overlapping prior stent).  . Diastolic dysfunction    a. 03/2016 Echo: EF 60-65%, no rwma, Gr1 DD, triv AI, Ao root 29mm, Asc Ao 40mm, triv MR.  . Dizziness    a. Carotid duplex 03/2014: mild fibrous plaque, no significant stenosis.  . Foley catheter in place    "been wearing it for a couple months now" (03/26/2016)  . HTN (hypertension)     . Hyperlipidemia LDL goal <70   . Non-STEMI (non-ST elevated myocardial infarction) ([place]) 11/2010   1. Ostial LAD 70% (to close to [facility] for PCI), subtotal occlusion of the RCA  . Postoperative atrial fibrillation ([place]) 11/2010   Post CABG, no sign recurrence  . Refusal of blood transfusions as patient is Jehovah's Witness   . 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 Servando Snare)  . Sleep apnea    Not on CPAP. (03/26/2016)  . ST elevation myocardial infarction (STEMI) of inferior wall ([place]) 04/05/2016   Occluded in-stent restenosis/thrombosis of SVG-RCA --> treated with overlapping Promus DES 3.0 mm 16 mm postdilated 3.0 mm).  . Stroke [facility]) 2014; 01/2015   a. 2014 with mild right hand weakness, nonhemorrhagic per pt.;; b. - PTA-Stent L ICA 95%   . Type II diabetes mellitus ([place])     Past Surgical History:  Procedure Laterality Date  . APPENDECTOMY    . CARDIAC CATHETERIZATION  12/11/2010   Dr. Chase Picket - subsequent cath - normal LV systolic function, no renal artery stenosis, severe 2-vessel disease with subtotaled RCA prox and distal 60% lesion and complex 70% area of narrowing of ostium of LAD  . CARDIAC CATHETERIZATION N/A 03/26/2016   Procedure: Left Heart Cath and Coronary Angiography;  Surgeon: Nelva Bush, MD;  Location: [place] CV LAB;  Service: Cardiovascular;  Laterality: N/A;  . CARDIAC CATHETERIZATION N/A 03/26/2016   Procedure: Coronary Stent Intervention;  Surgeon: Nelva Bush, MD;  Location: [place] CV LAB;  Service: Cardiovascular: 100% In-stent thrombosis of pros SVG-RCA (Xience DES) --> treated with PromusDES 3.0 x 18 (3.3 mm)  . CARDIAC CATHETERIZATION N/A 03/26/2016   Procedure: Bypass Graft Angiography;  Surgeon: Nelva Bush, MD;  Location: [place] CV LAB;  Service: Cardiovascular;  Laterality: N/A;  . CORONARY ANGIOPLASTY WITH STENT PLACEMENT  06/18/2014   PCI to SVG-RPDA 06/18/14 (Xience Alpine DES 3.0 x 18 mm -3.35 mm),   . CORONARY  ARTERY BYPASS GRAFT  12/15/2010   X2, Dr Servando Snare; LIMA to LAD, SVG to PDA;   . CORONARY/GRAFT ANGIOGRAPHY N/A 08/27/2017   Procedure: CORONARY/GRAFT ANGIOGRAPHY;  Surgeon: Nelva Bush, MD;  Location: [place] CV LAB;  Service: Cardiovascular;; pLAD 70%, ostD1 50%.  mCx 60%, OM2 80%. pRCA 95% & mRCA 100% - rPDA 70%. LIMA-mLAD patent. SVG-rPDA 10% ISR.   Marland Kitchen CYSTOSCOPY WITH URETHRAL DILATATION    . IR ANGIO INTRA EXTRACRAN SEL COM CAROTID INNOMINATE BILAT MOD SED  02/25/2017  . IR ANGIO INTRA EXTRACRAN SEL COM CAROTID INNOMINATE BILAT MOD SED  10/19/2017   Dr. Kathee [name]: L Common Carotid - ECA & major branches widely patent. ICA ~20% distal to bulb & 50% in supraclinoid segment. LMCA-distal 1/3 MI ~905 stenosis with post-stenotic dilation into inferior division. ~50% prox Basilar A stenosis @ anterior Inf Cerebellar A. 50% R ICA  . IR ANGIO VERTEBRAL SEL SUBCLAVIAN INNOMINATE UNI L MOD SED  02/25/2017  . IR ANGIO VERTEBRAL SEL SUBCLAVIAN INNOMINATE UNI R MOD SED  10/19/2017  . IR ANGIO VERTEBRAL SEL VERTEBRAL UNI  L MOD SED  10/19/2017  . IR GENERIC HISTORICAL  01/21/2016   IR RADIOLOGIST EVAL & MGMT 01/21/2016 [facility]  . IR GENERIC HISTORICAL  02/03/2016   IR CATHETER TUBE CHANGE 02/03/2016 Marybelle Killings, MD [facility]  . IR RADIOLOGIST EVAL & MGMT  11/09/2017  . LEFT HEART CATHETERIZATION WITH CORONARY ANGIOGRAM N/A 06/18/2014   Procedure: LEFT HEART CATHETERIZATION WITH CORONARY ANGIOGRAM;  Surgeon: Leonie Man, MD;  [facility] CATH LAB;  Service: Cardiovascular;  -- severe disease of SVG-rPDA  . RADIOLOGY WITH ANESTHESIA N/A 01/24/2015   Procedure: STENT ASSISTED ANGIOPLASTY (RADIOLOGY WITH ANESTHESIA);  Surgeon: Luanne Bras, MD;  Location: [place];  Service: Radiology;  Laterality: N/A;  . TONSILLECTOMY    . TRANSTHORACIC ECHOCARDIOGRAM  3/'14; 11/'16:11/'17   a. EF 55-60%, severe Conc LVH, Nl Systolic fxn, G1 DD,  Ao Sclerosis w/o stenosis; CRO PFO;; b. Moderate concentric LVH. EF  60-65%. No RWMA, Gr 1 DD. Mild LA dilation;; c) HK of the distal inferior wall. EF 60-65%.  GR 1 DD. Ascending aorta/root 45-46 mm.  . TRANSTHORACIC ECHOCARDIOGRAM  08/2017    EF 60-65%. Mild LVH. No RWMA. Gr 1 DD. Mod LA dilation.     Nov 2017: PCI to SVG-rPDA ISR/thrombosis - Promus Premier DES 3.0 x 16 (3.66mm)     Cath 08/2017 - Diagnostic Diagram: med Rx (now CTO of RCA): Escalate medical therapy; will increase isosorbide mononitrate and hydralazine.  pLAD 70%, ostD1 50%.  mCx 60%, OM2 80%. pRCA 95% & mRCA 100% - rPDA 70%. LIMA-mLAD patent. SVG-rPDA 10% ISR.      Transthoracic Echo 08/28/2017: EF 60-65%. Mild LVH. No RWMA. Gr 1 DD. Mod LA dilation.   AAA Duplex 09/02/17: Mild ectasia of the proximal aorta measuring 2.6 cm x 2.6 cm. Unable to obtain previous aneurysmal dilatation of 3.4 cm. The mid to distal aorta demonstrates normal taper and measurements with no suggestion of aneurysmal dilatation. Mild ectasia of the left common iliac artery is noted, measuring 1.7 cm.   Cerebral Angio 6/4 (Dr. Estanislado Pandy):   L Common Carotid - ECA & major branches widely patent. ICA ~20% distal to bulb & 50% in supraclinoid segment. LMCA-distal 1/3 MI ~905 stenosis with post-stenotic dilation into inferior division. ~50% prox Basilar A stenosis @ anterior Inf Cerebellar A. 50% R ICA 1. Widely patent LIMA-LAD. Patent SVG-rPDA. Patent SVG overlapping stents have minimal in-stent restenosis. 2. Inability to cross aortic valve from the left radial. 3. Systemic hypertension  Current Meds  Medication Sig  . amLODipine (NORVASC) 10 MG tablet Take 10 mg by mouth every evening.   Marland Kitchen aspirin 81 MG chewable tablet Chew 1 tablet (81 mg total) by mouth daily.  . cephALEXin (KEFLEX) 500 MG capsule Take 1 capsule (500 mg total) by mouth 3 (three) times daily for 7 days.  . clopidogrel (PLAVIX) 75 MG tablet Take 1 tablet (75 mg total) by mouth daily.  Marland Kitchen doxycycline (MONODOX) 100 MG capsule Take 100 mg by mouth 2  (two) times daily. 10 day course started 12/09/17  . furosemide (LASIX) 20 MG tablet Take 1 tablet (20 mg total) by mouth as needed. (Patient taking differently: Take 20 mg by mouth daily as needed for fluid (swelling). )  . glimepiride (AMARYL) 4 MG tablet Take 4 mg by mouth 2 (two) times daily with a meal.   . hydrALAZINE (APRESOLINE) 100 MG tablet Take 1 tablet (100 mg total) by mouth 3 (three) times daily.  . isosorbide mononitrate (IMDUR) 60 MG  24 hr tablet Take 1 tablet (60 mg total) by mouth daily. (Patient not taking: Reported on 12/18/2017)  . labetalol (NORMODYNE) 200 MG tablet Take 200 mg by mouth 2 (two) times daily.   Marland Kitchen losartan-hydrochlorothiazide (HYZAAR) 100-25 MG tablet Take 1 tablet by mouth every evening. (Patient taking differently: Take 1 tablet by mouth every morning. )  . metFORMIN (GLUCOPHAGE) 1000 MG tablet Take 1 tablet (1,000 mg total) by mouth 2 (two) times daily.  . nitroGLYCERIN (NITROSTAT) 0.4 MG SL tablet Place 1 tablet (0.4 mg total) under the tongue every 5 (five) minutes as needed for chest pain (up to 3 doses).  Marland Kitchen oxybutynin (DITROPAN) 5 MG tablet Take 5 mg by mouth daily as needed for bladder spasms.   . [DISCONTINUED] hydrALAZINE (APRESOLINE) 50 MG tablet Take 1.5 tablets (75 mg total) by mouth 3 (three) times daily.  . [DISCONTINUED] losartan-hydrochlorothiazide (HYZAAR) 100-25 MG per tablet Take 1 tablet by mouth every evening.     Allergies  Allergen Reactions  . Other Other (See Comments)    No  BLOOD PRODUCTS - Pt is Jehovah's Witness  . Atorvastatin Other (See Comments)    Myalgias  . Crestor [Rosuvastatin] Other (See Comments)    Myalgias   . Pravastatin Other (See Comments)    Myalgias  . Simvastatin Other (See Comments)    Myalgias    Social History   Tobacco Use  . Smoking status: Former Smoker    Packs/day: 0.33    Years: 10.00    Pack years: 3.30    Types: Cigarettes    Last attempt to quit: 1961    Years since quitting: 58.6  .  Smokeless tobacco: Never Used  Substance Use Topics  . Alcohol use: No  . Drug use: No   Social History   Social History Narrative   Married father of one.  Previously uses stationary bike routinely.  Now reduced due to other social stressors.   Quit smoking 50 years ago.  Does not drink alcohol     family history includes Heart Problems (age of onset: 64) in his father.  Wt Readings from Last 3 Encounters:  12/16/17 217 lb 12.8 oz (98.8 kg)  12/13/17 220 lb (99.8 kg)  10/28/17 220 lb 6.4 oz (100 kg)    PHYSICAL EXAM BP (!) 179/78   Pulse (!) 53   Ht 6\' 1"  (1.854 m)   Wt 217 lb 12.8 oz (98.8 kg)   BMI 28.74 kg/m   Physical Exam  Constitutional: He is oriented to person, place, and time. He appears well-developed and well-nourished. No distress.  Relatively healthy-appearing.  Well-groomed.  HENT:  Head: Normocephalic and atraumatic.  Eyes: Conjunctivae and EOM are normal. No scleral icterus.  Neck: Normal range of motion. Neck supple. No hepatojugular reflux and no JVD present. Carotid bruit is not present.  Cardiovascular: Normal rate, regular rhythm and normal pulses.  Occasional extrasystoles are present. PMI is not displaced (Difficult to palpate). Exam reveals distant heart sounds. Exam reveals no gallop and no friction rub.  Murmur heard.  Medium-pitched harsh crescendo-decrescendo early systolic murmur is present with a grade of 1/6 at the upper right sternal border radiating to the neck. No HJR  Pulmonary/Chest: Effort normal and breath sounds normal. No respiratory distress. He has no wheezes. He has no rales.  Abdominal: Soft. Bowel sounds are normal. He exhibits no distension. There is no tenderness. There is no rebound.  Musculoskeletal: Normal range of motion. He exhibits no edema.  R  hand weak with mild contractures & plegia post CVA - stable.  Neurological: He is alert and oriented to person, place, and time.  r hand paresis  Psychiatric: He has a normal  mood and affect. His behavior is normal. Judgment and thought content normal.  Nursing note and vitals reviewed.   Adult ECG Report  Other studies Reviewed: Additional studies/ records that were reviewed today include:  Recent Labs:  n/a  Lab Results  Component Value Date   CHOL 134 01/11/2017   HDL 40 01/11/2017   LDLCALC 77 01/11/2017   TRIG 83 01/11/2017   CHOLHDL 3.4 01/11/2017   Lab Results  Component Value Date   CREATININE 1.51 (H) 12/18/2017   BUN 27 (H) 12/18/2017   NA 137 12/18/2017   K 3.9 12/18/2017   CL 104 12/18/2017   CO2 22 12/18/2017    ASSESSMENT / PLAN:  He is doing much better since starting on antibiotics for UTI. He is here apparently b/c he was told to return by EDP (he called our office b/c he was not feeling well - was told to go to ER)  Problem List Items Addressed This Visit    Abdominal aortic aneurysm ([place]) (Chronic)    Most recent AAA duplex did not see the 3.4 x 3.4 cm dilation --> recheck in ~ 2 yrs.      Relevant Medications   losartan-hydrochlorothiazide (HYZAAR) 100-25 MG tablet   hydrALAZINE (APRESOLINE) 100 MG tablet   CAD S/P percutaneous coronary angioplasty (Chronic)    PCI of SVG-RCA (overlapping DES stents with 2nd one for ISR).  On ASA & Plavix for combination of CAD & Cerebrovascular diseaes. - OK for hold Plavix for procedures.      Relevant Medications   losartan-hydrochlorothiazide (HYZAAR) 100-25 MG tablet   hydrALAZINE (APRESOLINE) 100 MG tablet   Coronary artery disease involving native coronary artery of native heart with angina pectoris (HCC) (Chronic)    Severe native CAD by recent cath but widely patent stents in his graft.  Patent LIMA.  This was just in April.  Therefore not likely that he is having anginal symptoms by blood pressure.  I think his chest discomfort recently. Plan: Continue amlodipine and beta-blocker, would like to convert to carvedilol. On Plavix and aspirin for combination of CAD and  cerebrovascular disease.  not on statin - but has had relatively controlled lipids      Relevant Medications   losartan-hydrochlorothiazide (HYZAAR) 100-25 MG tablet   hydrALAZINE (APRESOLINE) 100 MG tablet   Hyperlipidemia associated with type 2 diabetes mellitus ([place]) (Chronic)    Has not had lipids checked since 2018 - due for check in Aug. Pretty well controlled without statin - may consider Zetia      Relevant Medications   losartan-hydrochlorothiazide (HYZAAR) 100-25 MG tablet   hydrALAZINE (APRESOLINE) 100 MG tablet   Moderate essential hypertension (Chronic)    BP is quite high today --> increase Hydralazine to 100 mg tid. Next step is to change Labetalol to Carvedilol. Continue Amlodipine @ max dose& restart Hyzaar .      Relevant Medications   losartan-hydrochlorothiazide (HYZAAR) 100-25 MG tablet   hydrALAZINE (APRESOLINE) 100 MG tablet   Stenosis of left carotid artery (Chronic)    Seen in Carotid Angiogram by Dr. Estanislado Pandy - not sure what the plan is going forward. Was supposed to follow-up with Dr. Estanislado Pandy in June - but has not been seen.      Relevant Medications   losartan-hydrochlorothiazide (HYZAAR) 100-25 MG  tablet   hydrALAZINE (APRESOLINE) 100 MG tablet      Current medicines are reviewed at length with the patient today. (+/- concerns) n/a The following changes have been made: n/a  Patient Instructions  Medication Instructions:   INCREASE HYDRALAZINE TO 100 MG THREE TIMES A DAY= 2 OF THE 50 MG TABLETS THREE TIMES DAILY  RESTART LOSARTAN HCTZ 100/25 MG ONCE DAILY  Follow-Up:  Your physician recommends that you schedule a follow-up appointment in: AS SCHEDULED   If you need a refill on your cardiac medications before your next appointment, please call your pharmacy.      Studies Ordered:   No orders of the defined types were placed in this encounter.     Glenetta Hew, M.D., M.S. Interventional Cardiologist   Pager #  [phone] Phone # [phone] [address] [place], [place] [address]

## 2017-12-16 NOTE — Patient Instructions (Signed)
Medication Instructions:   INCREASE HYDRALAZINE TO 100 MG THREE TIMES A DAY= 2 OF THE 50 MG TABLETS THREE TIMES DAILY  RESTART LOSARTAN HCTZ 100/25 MG ONCE DAILY  Follow-Up:  Your physician recommends that you schedule a follow-up appointment in: AS SCHEDULED   If you need a refill on your cardiac medications before your next appointment, please call your pharmacy.

## 2017-12-18 ENCOUNTER — Emergency Department (HOSPITAL_COMMUNITY): Payer: Medicare Other

## 2017-12-18 ENCOUNTER — Inpatient Hospital Stay (HOSPITAL_COMMUNITY)
Admission: EM | Admit: 2017-12-18 | Discharge: 2017-12-22 | DRG: 699 | Disposition: A | Discharge status: Home / Self Care | Payer: Medicare Other | Attending: Internal Medicine | Admitting: Internal Medicine

## 2017-12-18 ENCOUNTER — Encounter: Payer: Self-pay | Admitting: Cardiology

## 2017-12-18 ENCOUNTER — Other Ambulatory Visit: Payer: Self-pay

## 2017-12-18 ENCOUNTER — Encounter (HOSPITAL_COMMUNITY): Payer: Self-pay | Admitting: Emergency Medicine

## 2017-12-18 DIAGNOSIS — Z960 Presence of urogenital implants: Secondary | ICD-10-CM | POA: Diagnosis not present

## 2017-12-18 DIAGNOSIS — K449 Diaphragmatic hernia without obstruction or gangrene: Secondary | ICD-10-CM | POA: Diagnosis not present

## 2017-12-18 DIAGNOSIS — G5691 Unspecified mononeuropathy of right upper limb: Secondary | ICD-10-CM | POA: Diagnosis not present

## 2017-12-18 DIAGNOSIS — G4730 Sleep apnea, unspecified: Secondary | ICD-10-CM | POA: Diagnosis present

## 2017-12-18 DIAGNOSIS — Z7984 Long term (current) use of oral hypoglycemic drugs: Secondary | ICD-10-CM | POA: Diagnosis not present

## 2017-12-18 DIAGNOSIS — I2510 Atherosclerotic heart disease of native coronary artery without angina pectoris: Secondary | ICD-10-CM | POA: Diagnosis not present

## 2017-12-18 DIAGNOSIS — Z9861 Coronary angioplasty status: Secondary | ICD-10-CM | POA: Diagnosis not present

## 2017-12-18 DIAGNOSIS — E7800 Pure hypercholesterolemia, unspecified: Secondary | ICD-10-CM

## 2017-12-18 DIAGNOSIS — E119 Type 2 diabetes mellitus without complications: Secondary | ICD-10-CM

## 2017-12-18 DIAGNOSIS — Z7902 Long term (current) use of antithrombotics/antiplatelets: Secondary | ICD-10-CM | POA: Diagnosis not present

## 2017-12-18 DIAGNOSIS — I2584 Coronary atherosclerosis due to calcified coronary lesion: Secondary | ICD-10-CM | POA: Diagnosis not present

## 2017-12-18 DIAGNOSIS — I10 Essential (primary) hypertension: Secondary | ICD-10-CM | POA: Diagnosis present

## 2017-12-18 DIAGNOSIS — R7989 Other specified abnormal findings of blood chemistry: Secondary | ICD-10-CM

## 2017-12-18 DIAGNOSIS — N433 Hydrocele, unspecified: Secondary | ICD-10-CM | POA: Diagnosis not present

## 2017-12-18 DIAGNOSIS — N12 Tubulo-interstitial nephritis, not specified as acute or chronic: Secondary | ICD-10-CM | POA: Diagnosis present

## 2017-12-18 DIAGNOSIS — B965 Pseudomonas (aeruginosa) (mallei) (pseudomallei) as the cause of diseases classified elsewhere: Secondary | ICD-10-CM | POA: Diagnosis present

## 2017-12-18 DIAGNOSIS — E785 Hyperlipidemia, unspecified: Secondary | ICD-10-CM | POA: Diagnosis not present

## 2017-12-18 DIAGNOSIS — M7989 Other specified soft tissue disorders: Secondary | ICD-10-CM | POA: Diagnosis not present

## 2017-12-18 DIAGNOSIS — R0789 Other chest pain: Secondary | ICD-10-CM | POA: Diagnosis not present

## 2017-12-18 DIAGNOSIS — N390 Urinary tract infection, site not specified: Secondary | ICD-10-CM

## 2017-12-18 DIAGNOSIS — I712 Thoracic aortic aneurysm, without rupture: Secondary | ICD-10-CM | POA: Diagnosis not present

## 2017-12-18 DIAGNOSIS — I714 Abdominal aortic aneurysm, without rupture: Secondary | ICD-10-CM | POA: Diagnosis not present

## 2017-12-18 DIAGNOSIS — I25119 Atherosclerotic heart disease of native coronary artery with unspecified angina pectoris: Secondary | ICD-10-CM | POA: Diagnosis not present

## 2017-12-18 DIAGNOSIS — A419 Sepsis, unspecified organism: Secondary | ICD-10-CM | POA: Diagnosis not present

## 2017-12-18 DIAGNOSIS — Z79899 Other long term (current) drug therapy: Secondary | ICD-10-CM

## 2017-12-18 DIAGNOSIS — M7022 Olecranon bursitis, left elbow: Secondary | ICD-10-CM

## 2017-12-18 DIAGNOSIS — Z951 Presence of aortocoronary bypass graft: Secondary | ICD-10-CM | POA: Diagnosis not present

## 2017-12-18 DIAGNOSIS — Z888 Allergy status to other drugs, medicaments and biological substances status: Secondary | ICD-10-CM

## 2017-12-18 DIAGNOSIS — N179 Acute kidney failure, unspecified: Secondary | ICD-10-CM | POA: Diagnosis present

## 2017-12-18 DIAGNOSIS — Z8673 Personal history of transient ischemic attack (TIA), and cerebral infarction without residual deficits: Secondary | ICD-10-CM | POA: Diagnosis not present

## 2017-12-18 DIAGNOSIS — N5089 Other specified disorders of the male genital organs: Secondary | ICD-10-CM | POA: Diagnosis not present

## 2017-12-18 DIAGNOSIS — Z9359 Other cystostomy status: Secondary | ICD-10-CM

## 2017-12-18 DIAGNOSIS — I252 Old myocardial infarction: Secondary | ICD-10-CM

## 2017-12-18 DIAGNOSIS — R531 Weakness: Secondary | ICD-10-CM | POA: Diagnosis not present

## 2017-12-18 DIAGNOSIS — R748 Abnormal levels of other serum enzymes: Secondary | ICD-10-CM | POA: Diagnosis not present

## 2017-12-18 DIAGNOSIS — N509 Disorder of male genital organs, unspecified: Secondary | ICD-10-CM

## 2017-12-18 DIAGNOSIS — Z7982 Long term (current) use of aspirin: Secondary | ICD-10-CM

## 2017-12-18 DIAGNOSIS — N3000 Acute cystitis without hematuria: Secondary | ICD-10-CM | POA: Diagnosis present

## 2017-12-18 DIAGNOSIS — N10 Acute tubulo-interstitial nephritis: Secondary | ICD-10-CM

## 2017-12-18 DIAGNOSIS — N183 Chronic kidney disease, stage 3 unspecified: Secondary | ICD-10-CM

## 2017-12-18 DIAGNOSIS — T83518A Infection and inflammatory reaction due to other urinary catheter, initial encounter: Secondary | ICD-10-CM | POA: Diagnosis not present

## 2017-12-18 DIAGNOSIS — Y846 Urinary catheterization as the cause of abnormal reaction of the patient, or of later complication, without mention of misadventure at the time of the procedure: Secondary | ICD-10-CM | POA: Diagnosis present

## 2017-12-18 DIAGNOSIS — E1165 Type 2 diabetes mellitus with hyperglycemia: Secondary | ICD-10-CM | POA: Diagnosis present

## 2017-12-18 DIAGNOSIS — Z87891 Personal history of nicotine dependence: Secondary | ICD-10-CM | POA: Diagnosis not present

## 2017-12-18 DIAGNOSIS — T83511D Infection and inflammatory reaction due to indwelling urethral catheter, subsequent encounter: Secondary | ICD-10-CM | POA: Diagnosis not present

## 2017-12-18 DIAGNOSIS — R778 Other specified abnormalities of plasma proteins: Secondary | ICD-10-CM

## 2017-12-18 DIAGNOSIS — Z955 Presence of coronary angioplasty implant and graft: Secondary | ICD-10-CM

## 2017-12-18 DIAGNOSIS — R6510 Systemic inflammatory response syndrome (SIRS) of non-infectious origin without acute organ dysfunction: Secondary | ICD-10-CM | POA: Diagnosis not present

## 2017-12-18 DIAGNOSIS — R079 Chest pain, unspecified: Secondary | ICD-10-CM | POA: Diagnosis not present

## 2017-12-18 DIAGNOSIS — R110 Nausea: Secondary | ICD-10-CM | POA: Diagnosis not present

## 2017-12-18 DIAGNOSIS — I440 Atrioventricular block, first degree: Secondary | ICD-10-CM | POA: Diagnosis not present

## 2017-12-18 DIAGNOSIS — A4152 Sepsis due to Pseudomonas: Secondary | ICD-10-CM

## 2017-12-18 DIAGNOSIS — I255 Ischemic cardiomyopathy: Secondary | ICD-10-CM | POA: Diagnosis present

## 2017-12-18 DIAGNOSIS — M7020 Olecranon bursitis, unspecified elbow: Secondary | ICD-10-CM | POA: Diagnosis not present

## 2017-12-18 DIAGNOSIS — Z87448 Personal history of other diseases of urinary system: Secondary | ICD-10-CM

## 2017-12-18 DIAGNOSIS — I861 Scrotal varices: Secondary | ICD-10-CM | POA: Diagnosis present

## 2017-12-18 DIAGNOSIS — T8389XA Other specified complication of genitourinary prosthetic devices, implants and grafts, initial encounter: Secondary | ICD-10-CM | POA: Diagnosis not present

## 2017-12-18 DIAGNOSIS — I69351 Hemiplegia and hemiparesis following cerebral infarction affecting right dominant side: Secondary | ICD-10-CM | POA: Diagnosis not present

## 2017-12-18 DIAGNOSIS — N4889 Other specified disorders of penis: Secondary | ICD-10-CM | POA: Diagnosis not present

## 2017-12-18 LAB — HEPATIC FUNCTION PANEL
ALT: 10 U/L (ref 0–44)
AST: 16 U/L (ref 15–41)
Albumin: 3.6 g/dL (ref 3.5–5.0)
Alkaline Phosphatase: 67 U/L (ref 38–126)
BILIRUBIN DIRECT: 0.2 mg/dL (ref 0.0–0.2)
BILIRUBIN TOTAL: 1 mg/dL (ref 0.3–1.2)
Indirect Bilirubin: 0.8 mg/dL (ref 0.3–0.9)
Total Protein: 6.3 g/dL — ABNORMAL LOW (ref 6.5–8.1)

## 2017-12-18 LAB — BASIC METABOLIC PANEL
Anion gap: 11 (ref 5–15)
BUN: 27 mg/dL — ABNORMAL HIGH (ref 8–23)
CHLORIDE: 104 mmol/L (ref 98–111)
CO2: 22 mmol/L (ref 22–32)
Calcium: 9 mg/dL (ref 8.9–10.3)
Creatinine, Ser: 1.51 mg/dL — ABNORMAL HIGH (ref 0.61–1.24)
GFR calc non Af Amer: 41 mL/min — ABNORMAL LOW (ref >60)
GFR, EST AFRICAN AMERICAN: 47 mL/min — AB (ref >60)
Glucose, Bld: 144 mg/dL — ABNORMAL HIGH (ref 70–99)
POTASSIUM: 3.9 mmol/L (ref 3.5–5.1)
Sodium: 137 mmol/L (ref 135–145)

## 2017-12-18 LAB — DIFFERENTIAL
BAND NEUTROPHILS: 0 %
Basophils Absolute: 0.2 10*3/uL — ABNORMAL HIGH (ref 0.0–0.1)
Basophils Relative: 1 %
Blasts: 0 %
Eosinophils Absolute: 0 10*3/uL (ref 0.0–0.7)
Eosinophils Relative: 0 %
LYMPHS ABS: 3.2 10*3/uL (ref 0.7–4.0)
LYMPHS PCT: 18 %
MONO ABS: 4.6 10*3/uL — AB (ref 0.1–1.0)
MYELOCYTES: 0 %
Metamyelocytes Relative: 0 %
Monocytes Relative: 26 %
Neutro Abs: 9.8 10*3/uL — ABNORMAL HIGH (ref 1.7–7.7)
Neutrophils Relative %: 55 %
OTHER: 0 %
PROMYELOCYTES RELATIVE: 0 %
nRBC: 0 /100 WBC

## 2017-12-18 LAB — LIPASE, BLOOD: Lipase: 27 U/L (ref 11–51)

## 2017-12-18 LAB — CBC
HEMATOCRIT: 33.9 % — AB (ref 39.0–52.0)
Hemoglobin: 10.7 g/dL — ABNORMAL LOW (ref 13.0–17.0)
MCH: 29 pg (ref 26.0–34.0)
MCHC: 31.6 g/dL (ref 30.0–36.0)
MCV: 91.9 fL (ref 78.0–100.0)
PLATELETS: 274 10*3/uL (ref 150–400)
RBC: 3.69 MIL/uL — AB (ref 4.22–5.81)
RDW: 16.8 % — ABNORMAL HIGH (ref 11.5–15.5)
WBC: 17.8 10*3/uL — ABNORMAL HIGH (ref 4.0–10.5)

## 2017-12-18 LAB — I-STAT CG4 LACTIC ACID, ED
LACTIC ACID, VENOUS: 2.02 mmol/L — AB (ref 0.5–1.9)
Lactic Acid, Venous: 1.48 mmol/L (ref 0.5–1.9)
Lactic Acid, Venous: 0.94 mmol/L (ref 0.5–1.9)

## 2017-12-18 LAB — URINALYSIS, ROUTINE W REFLEX MICROSCOPIC
Bacteria, UA: NONE SEEN
Bilirubin Urine: NEGATIVE
Glucose, UA: 50 mg/dL — AB
Ketones, ur: NEGATIVE mg/dL
Nitrite: POSITIVE — AB
Specific Gravity, Urine: 1.014 (ref 1.005–1.030)
pH: 5 (ref 5.0–8.0)

## 2017-12-18 LAB — URIC ACID: URIC ACID, SERUM: 8.3 mg/dL (ref 3.7–8.6)

## 2017-12-18 MED ORDER — ONDANSETRON HCL 4 MG/2ML IJ SOLN
4.0000 mg | Freq: Once | INTRAMUSCULAR | Status: AC
  Administered 2017-12-18: 4 mg via INTRAVENOUS
  Filled 2017-12-18: qty 2

## 2017-12-18 MED ORDER — LABETALOL HCL 200 MG PO TABS
200.0000 mg | ORAL_TABLET | Freq: Two times a day (BID) | ORAL | Status: DC
  Administered 2017-12-18 – 2017-12-21 (×6): 200 mg via ORAL
  Filled 2017-12-18 (×6): qty 1

## 2017-12-18 MED ORDER — ACETAMINOPHEN 650 MG RE SUPP
650.0000 mg | Freq: Once | RECTAL | Status: AC
  Administered 2017-12-18: 650 mg via RECTAL
  Filled 2017-12-18: qty 1

## 2017-12-18 MED ORDER — ENOXAPARIN SODIUM 40 MG/0.4ML ~~LOC~~ SOLN
40.0000 mg | SUBCUTANEOUS | Status: DC
  Administered 2017-12-18 – 2017-12-21 (×4): 40 mg via SUBCUTANEOUS
  Filled 2017-12-18 (×4): qty 0.4

## 2017-12-18 MED ORDER — LIDOCAINE HCL (PF) 1 % IJ SOLN
5.0000 mL | Freq: Once | INTRAMUSCULAR | Status: AC
  Administered 2017-12-18: 5 mL
  Filled 2017-12-18: qty 5

## 2017-12-18 MED ORDER — SODIUM CHLORIDE 0.9 % IV BOLUS (SEPSIS)
1000.0000 mL | Freq: Once | INTRAVENOUS | Status: AC
  Administered 2017-12-18: 1000 mL via INTRAVENOUS

## 2017-12-18 MED ORDER — ASPIRIN 81 MG PO CHEW
81.0000 mg | CHEWABLE_TABLET | Freq: Every day | ORAL | Status: DC
  Administered 2017-12-19 – 2017-12-22 (×4): 81 mg via ORAL
  Filled 2017-12-18 (×4): qty 1

## 2017-12-18 MED ORDER — SODIUM CHLORIDE 0.9 % IV SOLN
1.0000 g | Freq: Once | INTRAVENOUS | Status: AC
  Administered 2017-12-18: 1 g via INTRAVENOUS
  Filled 2017-12-18: qty 10

## 2017-12-18 MED ORDER — ISOSORBIDE MONONITRATE ER 30 MG PO TB24
30.0000 mg | ORAL_TABLET | Freq: Every evening | ORAL | Status: DC
  Administered 2017-12-18 – 2017-12-20 (×3): 30 mg via ORAL
  Filled 2017-12-18 (×3): qty 1

## 2017-12-18 MED ORDER — ACETAMINOPHEN 500 MG PO TABS
1000.0000 mg | ORAL_TABLET | Freq: Once | ORAL | Status: DC
  Filled 2017-12-18: qty 2

## 2017-12-18 MED ORDER — HYDRALAZINE HCL 50 MG PO TABS
100.0000 mg | ORAL_TABLET | Freq: Three times a day (TID) | ORAL | Status: DC
  Filled 2017-12-18: qty 2

## 2017-12-18 MED ORDER — AMLODIPINE BESYLATE 5 MG PO TABS
10.0000 mg | ORAL_TABLET | Freq: Every evening | ORAL | Status: DC
  Administered 2017-12-18 – 2017-12-21 (×4): 10 mg via ORAL
  Filled 2017-12-18 (×5): qty 2

## 2017-12-18 MED ORDER — SODIUM CHLORIDE 0.9 % IV SOLN
Freq: Once | INTRAVENOUS | Status: AC
  Administered 2017-12-18: 15:00:00 via INTRAVENOUS

## 2017-12-18 MED ORDER — CLOPIDOGREL BISULFATE 75 MG PO TABS
75.0000 mg | ORAL_TABLET | Freq: Every day | ORAL | Status: DC
  Administered 2017-12-19 – 2017-12-22 (×4): 75 mg via ORAL
  Filled 2017-12-18 (×4): qty 1

## 2017-12-18 NOTE — Progress Notes (Signed)
New Admission Note:    Arrival Method: stretcher Mental Orientation: alert and oriented x 4 Telemetry: 3V33 verified by 2 staff with central telemetry Assessment: completed Skin: assessed by 2 RNs. Noted dressing on left elbow placed following I and D while in ED. Dressing not removed d/t wound care instructions to begin dressing changes the evening of 8/4. IV: R antecubital with NS to gravity placed on IV pump at 125 ml/h Pain: denies Tubes: suprapubic catheter intact Safety Measures: Fall risk assessment completed Admission: completed 54M Orientation: done Family: not present on admission Personal Belongings:  clothing, billfold. Patient states nothing that needs to be locked up.   Orders have been reviewed and implemented. Will continue to monitor the patient. Call light has been placed within reach and bed alarm has been activated.    Manya Silvas, RN MSN [place] Phone number: [phone]

## 2017-12-18 NOTE — ED Notes (Signed)
Pt placed on 2L [place] with improvement to 93%, will cont to monitor

## 2017-12-18 NOTE — Progress Notes (Signed)
I have discussed this pt at length with EDP Dr. Colvin Caroli.  At this time the etiology of his sepsis is unknown, and thus we would recommend bedside I and D or the olecranon bursa as treatment for possible septic bursitis.  We will follow along on the floor with the primary team.

## 2017-12-18 NOTE — Assessment & Plan Note (Signed)
Has not had lipids checked since 2018 - due for check in Aug. Pretty well controlled without statin - may consider Zetia

## 2017-12-18 NOTE — ED Provider Notes (Signed)
[facility] EMERGENCY DEPARTMENT Provider Note   CSN: [id number] Arrival date & time: 12/18/17  1015     History   Chief Complaint Chief Complaint  Patient presents with  . Flank Pain    HPI [name] is a 84 y.o. male.  HPI Was seen in the emergency department 6 days ago for generalized weakness and fatigue.  He was diagnosed with urinary tract infection.  Patient has suprapubic catheter.  He was started on Keflex.  He reports he initially started to feel better after taking antibiotic.  2 days ago however he started to feel worse again.  He reports he started to get a lot of chills general weakness and nausea.  Last night he vomited 2 times.  Fever had gone up.  He developed right sided back and flank pain yesterday evening.  He reports today he feels extremely fatigued in addition to now having right flank pain.  He has taken his antibiotics as prescribed and only has 1 additional day left. Past Medical History:  Diagnosis Date  . Abdominal aortic aneurysm ([place])    a. Aortic duplex 06/2014: mild aneurysmal dilatation of proximal abdominal aorta measuring 3.4x3.4cm. No sig change from 2012. F/u due 06/2016;   . AKI (acute kidney injury) ([place]) 03/28/2016  . Arthritis    "right knee; never bothered me" (03/26/2016)  . 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 CAD in native artery    a. NSTEMI 11/2010 - CABG x2(LIMA to LAD, SVG to PDA). b. NEG Lexi MV 10/24/13, EF 53%, no perfusion abnormality, septal and apical HK noted. c. NSTEMI 05/2014 - s/p DES to SVG-RPDA 06/18/14 (Xience Alpine DES 3.0 x 18 mm -3.35 mm), EF 60-65; d. 03/2016 STEMI/PCI: LM nl, ost LAD 70%, mLCx 50%, pRCA 95% - mRCA 60%, dRPDA 70%, LIMA->LAD ok, o-p SVG->RPDA 100% (3.0x16 Promus DES overlapping prior stent).  . Diastolic dysfunction    a. 03/2016 Echo: EF 60-65%, no rwma, Gr1 DD, triv AI, Ao root 63mm, Asc Ao 57mm, triv MR.  . Dizziness    a. Carotid duplex  03/2014: mild fibrous plaque, no significant stenosis.  . Foley catheter in place    "been wearing it for a couple months now" (03/26/2016)  . HTN (hypertension)   . Hyperlipidemia LDL goal <70   . Non-STEMI (non-ST elevated myocardial infarction) ([place]) 11/2010   1. Ostial LAD 70% (to close to [facility] for PCI), subtotal occlusion of the RCA  . Postoperative atrial fibrillation ([place]) 11/2010   Post CABG, no sign recurrence  . Refusal of blood transfusions as patient is Jehovah's Witness   . 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 Servando Snare)  . Sleep apnea    Not on CPAP. (03/26/2016)  . ST elevation myocardial infarction (STEMI) of inferior wall ([place]) 04/05/2016   Occluded in-stent restenosis/thrombosis of SVG-RCA --> treated with overlapping Promus DES 3.0 mm 16 mm postdilated 3.0 mm).  . Stroke [facility]) 2014; 01/2015   a. 2014 with mild right hand weakness, nonhemorrhagic per pt.;; b. - PTA-Stent L ICA 95%   . Type II diabetes mellitus [facility])     Patient Active Problem List   Diagnosis Date Noted  . Sepsis ([place]) 12/18/2017  . Hemispheric carotid artery syndrome 12/22/2016  . AKI (acute kidney injury) ([place]) 03/28/2016  . S/P angioplasty with stent 03/26/16 to VG to PDA for in-stent restenosis with DES. 03/28/2016  . Acute  ST elevation myocardial infarction (STEMI) of inferior wall ([place]) 03/28/2016  . Carpal tunnel syndrome of right wrist 06/24/2015  . History of stroke 03/21/2015  . Type 2 diabetes mellitus with complication, without long-term current use of insulin ([place]) 03/21/2015  . Stenosis of left carotid artery   . Essential hypertension   . Hyperlipidemia LDL goal <70   . Cerebral infarction due to stenosis of left middle cerebral artery ([place]) 01/22/2015  . CAD S/P percutaneous coronary angioplasty 09/19/2014  . Coronary artery disease involving coronary bypass graft of native heart with other forms of angina pectoris ([place]) 09/19/2014  . Shortness of breath 09/19/2014  .  Polypharmacy 09/19/2014  . Abdominal aortic aneurysm ([place])   . Stroke ([place])   . Hyperlipidemia associated with type 2 diabetes mellitus ([place])   . Type II diabetes mellitus with complication ([place])   . Coronary artery disease involving native coronary artery of native heart with angina pectoris ([place])   . Abnormal TSH 06/19/2014  . Obesity 06/19/2014  . STEMI (ST elevation myocardial infarction) ([place]) 06/18/2014  . Bilateral lower extremity edema 03/30/2014  . Dizziness 02/21/2014  . Bradycardia 08/31/2013  . Postoperative atrial fibrillation ([facility])     Class: Temporary  . Diabetes mellitus type 2 in obese ([place])   . Moderate essential hypertension   . Apnea, sleep   . Balanitis xerotica obliterans   . History of: Non-STEMI (non-ST elevated myocardial infarction) 11/16/2010    Class: History of  . S/P CABG x 2 11/16/2010    Past Surgical History:  Procedure Laterality Date  . APPENDECTOMY    . CARDIAC CATHETERIZATION  12/11/2010   Dr. Chase Picket - subsequent cath - normal LV systolic function, no renal artery stenosis, severe 2-vessel disease with subtotaled RCA prox and distal 60% lesion and complex 70% area of narrowing of ostium of LAD  . CARDIAC CATHETERIZATION N/A 03/26/2016   Procedure: Left Heart Cath and Coronary Angiography;  Surgeon: Nelva Bush, MD;  Location: [place] CV LAB;  Service: Cardiovascular;  Laterality: N/A;  . CARDIAC CATHETERIZATION N/A 03/26/2016   Procedure: Coronary Stent Intervention;  Surgeon: Nelva Bush, MD;  Location: [place] CV LAB;  Service: Cardiovascular: 100% In-stent thrombosis of pros SVG-RCA (Xience DES) --> treated with PromusDES 3.0 x 18 (3.3 mm)  . CARDIAC CATHETERIZATION N/A 03/26/2016   Procedure: Bypass Graft Angiography;  Surgeon: Nelva Bush, MD;  Location: [place] CV LAB;  Service: Cardiovascular;  Laterality: N/A;  . CORONARY ANGIOPLASTY WITH STENT PLACEMENT  06/18/2014   PCI to SVG-RPDA 06/18/14 (Xience Alpine DES 3.0  x 18 mm -3.35 mm),   . CORONARY ARTERY BYPASS GRAFT  12/15/2010   X2, Dr Servando Snare; LIMA to LAD, SVG to PDA;   . CORONARY/GRAFT ANGIOGRAPHY N/A 08/27/2017   Procedure: CORONARY/GRAFT ANGIOGRAPHY;  Surgeon: Nelva Bush, MD;  Location: [place] CV LAB;  Service: Cardiovascular;; pLAD 70%, ostD1 50%.  mCx 60%, OM2 80%. pRCA 95% & mRCA 100% - rPDA 70%. LIMA-mLAD patent. SVG-rPDA 10% ISR.   Marland Kitchen CYSTOSCOPY WITH URETHRAL DILATATION    . IR ANGIO INTRA EXTRACRAN SEL COM CAROTID INNOMINATE BILAT MOD SED  02/25/2017  . IR ANGIO INTRA EXTRACRAN SEL COM CAROTID INNOMINATE BILAT MOD SED  10/19/2017   Dr. Kathee Delton: L Common Carotid - ECA & major branches widely patent. ICA ~20% distal to bulb & 50% in supraclinoid segment. LMCA-distal 1/3 MI ~905 stenosis with post-stenotic dilation into inferior division. ~50% prox Basilar A stenosis @ anterior Inf Cerebellar A. 50%  R ICA  . IR ANGIO VERTEBRAL SEL SUBCLAVIAN INNOMINATE UNI L MOD SED  02/25/2017  . IR ANGIO VERTEBRAL SEL SUBCLAVIAN INNOMINATE UNI R MOD SED  10/19/2017  . IR ANGIO VERTEBRAL SEL VERTEBRAL UNI L MOD SED  10/19/2017  . IR GENERIC HISTORICAL  01/21/2016   IR RADIOLOGIST EVAL & MGMT 01/21/2016 [facility]-INTERV RAD  . IR GENERIC HISTORICAL  02/03/2016   IR CATHETER TUBE CHANGE 02/03/2016 Marybelle Killings, MD [facility]-INTERV RAD  . IR RADIOLOGIST EVAL & MGMT  11/09/2017  . LEFT HEART CATHETERIZATION WITH CORONARY ANGIOGRAM N/A 06/18/2014   Procedure: LEFT HEART CATHETERIZATION WITH CORONARY ANGIOGRAM;  Surgeon: Leonie Man, MD;  Location: [facility] CATH LAB;  Service: Cardiovascular;  -- severe disease of SVG-rPDA  . RADIOLOGY WITH ANESTHESIA N/A 01/24/2015   Procedure: STENT ASSISTED ANGIOPLASTY (RADIOLOGY WITH ANESTHESIA);  Surgeon: Luanne Bras, MD;  Location: [facility];  Service: Radiology;  Laterality: N/A;  . TONSILLECTOMY    . TRANSTHORACIC ECHOCARDIOGRAM  3/'14; 11/'16:11/'17   a. EF 55-60%, severe Conc LVH, Nl Systolic fxn, G1 DD,  Ao Sclerosis w/o stenosis; CRO PFO;; b.  Moderate concentric LVH. EF 60-65%. No RWMA, Gr 1 DD. Mild LA dilation;; c) HK of the distal inferior wall. EF 60-65%.  GR 1 DD. Ascending aorta/root 45-46 mm.  . TRANSTHORACIC ECHOCARDIOGRAM  08/2017    EF 60-65%. Mild LVH. No RWMA. Gr 1 DD. Mod LA dilation.         Home Medications    Prior to Admission medications   Medication Sig Start Date End Date Taking? Authorizing Provider  amLODipine (NORVASC) 10 MG tablet Take 10 mg by mouth every evening.    Yes [provider]  aspirin 81 MG chewable tablet Chew 1 tablet (81 mg total) by mouth daily. 03/29/16  Yes Isaiah Serge, NP  cephALEXin (KEFLEX) 500 MG capsule Take 1 capsule (500 mg total) by mouth 3 (three) times daily for 7 days. 12/13/17 12/20/17 Yes Bero, Barth Kirks, MD  clopidogrel (PLAVIX) 75 MG tablet Take 1 tablet (75 mg total) by mouth daily. 09/22/17  Yes Croitoru, Mihai, MD  doxycycline (MONODOX) 100 MG capsule Take 100 mg by mouth 2 (two) times daily. 10 day course started 12/09/17 12/03/17  Yes [provider]  furosemide (LASIX) 20 MG tablet Take 1 tablet (20 mg total) by mouth as needed. Patient taking differently: Take 20 mg by mouth daily as needed for fluid (swelling).  09/23/17 12/22/17 Yes Almyra Deforest, PA  glimepiride (AMARYL) 4 MG tablet Take 4 mg by mouth 2 (two) times daily with a meal.    Yes [provider]  hydrALAZINE (APRESOLINE) 100 MG tablet Take 1 tablet (100 mg total) by mouth 3 (three) times daily. 12/16/17  Yes Leonie Man, MD  isosorbide mononitrate (IMDUR) 30 MG 24 hr tablet Take 30 mg by mouth every evening.   Yes [provider]  labetalol (NORMODYNE) 200 MG tablet Take 200 mg by mouth 2 (two) times daily.    Yes [provider]  losartan-hydrochlorothiazide (HYZAAR) 100-25 MG tablet Take 1 tablet by mouth every evening. Patient taking differently: Take 1 tablet by mouth every morning.  12/16/17  Yes Leonie Man, MD  metFORMIN (GLUCOPHAGE) 1000 MG tablet Take 1  tablet (1,000 mg total) by mouth 2 (two) times daily. 03/29/16  Yes Isaiah Serge, NP  nitroGLYCERIN (NITROSTAT) 0.4 MG SL tablet Place 1 tablet (0.4 mg total) under the tongue every 5 (five) minutes as needed for chest  pain (up to 3 doses). 08/25/17  Yes Almyra Deforest, PA  oxybutynin (DITROPAN) 5 MG tablet Take 5 mg by mouth daily as needed for bladder spasms.  09/07/17  Yes [provider]  isosorbide mononitrate (IMDUR) 60 MG 24 hr tablet Take 1 tablet (60 mg total) by mouth daily. Patient not taking: Reported on 12/18/2017 08/28/17   Erma Heritage, PA-C    Family History Family History  Problem Relation Age of Onset  . Heart Problems Father 32    Social History Social History   Tobacco Use  . Smoking status: Former Smoker    Packs/day: 0.33    Years: 10.00    Pack years: 3.30    Types: Cigarettes    Last attempt to quit: 1961    Years since quitting: 58.6  . Smokeless tobacco: Never Used  Substance Use Topics  . Alcohol use: No  . Drug use: No     Allergies   Other; Atorvastatin; Crestor [rosuvastatin]; Pravastatin; and Simvastatin   Review of Systems Review of Systems 10 Systems reviewed and are negative for acute change except as noted in the HPI.   Physical Exam Updated Vital Signs BP (!) 152/84 (BP Location: Left Arm)   Pulse 92   Temp 98.7 F (37.1 C) (Oral)   Resp (!) 21   Ht 6\' 1"  (1.854 m)   SpO2 92%   BMI 28.74 kg/m   Physical Exam  Constitutional: He is oriented to person, place, and time.  Patient is alert and appropriate without respiratory distress.  HENT:  Head: Normocephalic and atraumatic.  His membranes slightly dry.  Eyes: EOM are normal.  Cardiovascular: Normal rate, regular rhythm, normal heart sounds and intact distal pulses.  Pulmonary/Chest: Effort normal and breath sounds normal.  Abdominal: Soft. Bowel sounds are normal. He exhibits no distension. There is no tenderness. There is no guarding.  Positive right flank pain  to percussion.  Suprapubic catheter in place without drainage or discharge or erythema surrounding insertion site, small amount of crusting around the catheter.  Musculoskeletal: Normal range of motion. He exhibits no edema or tenderness.  Large soft swelling of posterior elbow along the olecranon consistent with bursitis.  Patient has normal range of motion of the elbow without pain.  He can completely flex and extend.  Minimal associated erythema.  See attached images.  Neurological: He is alert and oriented to person, place, and time. He exhibits normal muscle tone. Coordination normal.  Skin: Skin is warm and dry.  Psychiatric: He has a normal mood and affect.         ED Treatments / Results  Labs (all labs ordered are listed, but only abnormal results are displayed) Labs Reviewed  BASIC METABOLIC PANEL - Abnormal; Notable for the following components:      Result Value   Glucose, Bld 144 (*)    BUN 27 (*)    Creatinine, Ser 1.51 (*)    GFR calc non Af Amer 41 (*)    GFR calc Af Amer 47 (*)    All other components within normal limits  CBC - Abnormal; Notable for the following components:   WBC 17.8 (*)    RBC 3.69 (*)    Hemoglobin 10.7 (*)    HCT 33.9 (*)    RDW 16.8 (*)    All other components within normal limits  HEPATIC FUNCTION PANEL - Abnormal; Notable for the following components:   Total Protein 6.3 (*)    All other components  within normal limits  DIFFERENTIAL - Abnormal; Notable for the following components:   Neutro Abs 9.8 (*)    Monocytes Absolute 4.6 (*)    Basophils Absolute 0.2 (*)    All other components within normal limits  URINALYSIS, ROUTINE W REFLEX MICROSCOPIC - Abnormal; Notable for the following components:   Glucose, UA 50 (*)    Hgb urine dipstick SMALL (*)    Protein, ur >=300 (*)    Nitrite POSITIVE (*)    Leukocytes, UA SMALL (*)    All other components within normal limits  I-STAT CG4 LACTIC ACID, ED - Abnormal; Notable for the  following components:   Lactic Acid, Venous 2.02 (*)    All other components within normal limits  AEROBIC CULTURE (SUPERFICIAL SPECIMEN)  URINE CULTURE  CULTURE, BLOOD (ROUTINE X 2)  CULTURE, BLOOD (ROUTINE X 2)  LIPASE, BLOOD  URIC ACID  BASIC METABOLIC PANEL  CBC  I-STAT CG4 LACTIC ACID, ED  I-STAT CG4 LACTIC ACID, ED  I-STAT CG4 LACTIC ACID, ED    EKG None  Radiology Ct Abdomen Pelvis Wo Contrast  Result Date: 12/18/2017 CLINICAL DATA:  Pt to ER for evaluation of right flank pain onset 2 days ago with nausea and vomiting. Reports last week was diagnosed with UTI, started on doxycycline and ultimately switched to keflex, family reports patient is not getting any better EXAM: CT CHEST, ABDOMEN AND PELVIS WITHOUT CONTRAST TECHNIQUE: Multidetector CT imaging of the chest, abdomen and pelvis was performed following the standard protocol without IV contrast. COMPARISON:  CT of the abdomen on 06/04/2010 and 02/09/2010 FINDINGS: CT CHEST FINDINGS Cardiovascular: There is moderate atherosclerotic calcification of the thoracic aorta. The ascending aorta is aneurysmal, 4.7 centimeters. The noncontrast appearance of the pulmonary arteries is normal. There is dense atherosclerotic calcification of the coronary arteries. Previous CABG. Median sternotomy. Mediastinum/Nodes: The visualized portion of the thyroid gland has a normal appearance. Small hiatal hernia. No mediastinal, hilar, or axillary adenopathy. Lungs/Pleura: Study quality is degraded by patient motion artifact.No suspicious pulmonary nodules. No consolidations, pleural effusions, or pulmonary edema. Airways are patent. Musculoskeletal: Midthoracic spondylosis.  Median sternotomy. CT ABDOMEN PELVIS FINDINGS Hepatobiliary: The liver is diffusely low attenuation consistent with hepatic steatosis. The gallbladder is distended. No radiopaque calculi are present. Pancreas: Unremarkable. No pancreatic ductal dilatation or surrounding inflammatory  changes. Spleen: Normal in size without focal abnormality. Adrenals/Urinary Tract: Adrenal glands are normal in appearance. There is nonspecific perinephric stranding bilaterally, stable in appearance. Intrarenal calcifications or arterial calcifications identified within the kidneys bilaterally. No evidence for hydronephrosis or suspicious renal mass. Urinary bladder is decompressed by a suprapubic catheter. Stomach/Bowel: The stomach and small bowel loops are normal in appearance. There are numerous colonic diverticula. No acute diverticulitis. Significant stool burden. Appendix is not seen and is likely surgically absent. Vascular/Lymphatic: There is atherosclerotic calcification of the abdominal aorta. No associated aneurysm. No retroperitoneal or mesenteric adenopathy. Reproductive: The prostate is enlarged. Other: There is fat within the inguinal canals bilaterally, LEFT greater than RIGHT. Anterior abdominal wall is unremarkable. No free pelvic fluid. Musculoskeletal: There are degenerative changes primarily within the thoracic and lumbar spine. No suspicious lytic or blastic lesions are identified. IMPRESSION: 1. Aortic aneurysm NOS (ICD10-I71.9). Ascending aortic aneurysm, 4.7 centimeters. Recommend semi-annual imaging followup by CTA or MRA and referral to cardiothoracic surgery if not already obtained. This recommendation follows 2010 ACCF/AHA/AATS/ACR/ASA/SCA/SCAI/SIR/STS/SVM Guidelines for the Diagnosis and Management of Patients With Thoracic Aortic Disease. Circulation. 2010; 121: [id number] 2.  Aortic Atherosclerosis (ICD10-I70.0). 3.  Small hiatal hernia. 4. Hepatic steatosis. 5. Distension of the gallbladder. Although no radiopaque calculi are identified, consider further evaluation with ultrasound if there is also clinical concern for gallbladder disease. 6. Nonspecific and stable bilateral perinephric stranding. No evidence for urinary tract obstruction. Nephrolithiasis versus renal artery  calcifications. 7. Significant colonic diverticulosis. 8. Enlarged prostate. 9. Suprapubic catheter. Electronically Signed   By: Nolon Nations M.D.   On: 12/18/2017 13:53   Dg Elbow Complete Left  Result Date: 12/18/2017 CLINICAL DATA:  2-3 days ago started having swelling of his posterior elbow. No known injury. No redness or pain. EXAM: LEFT ELBOW - COMPLETE 3+ VIEW COMPARISON:  None. FINDINGS: There is no evidence of fracture, dislocation, or joint effusion. There are mild degenerative changes of the elbow. Note is made of soft tissue swelling along the posterior aspect of the olecranon. IMPRESSION: Soft tissue swelling.  No evidence for acute osseous abnormality. Electronically Signed   By: Nolon Nations M.D.   On: 12/18/2017 14:37   Ct Chest Wo Contrast  Result Date: 12/18/2017 CLINICAL DATA:  Pt to ER for evaluation of right flank pain onset 2 days ago with nausea and vomiting. Reports last week was diagnosed with UTI, started on doxycycline and ultimately switched to keflex, family reports patient is not getting any better EXAM: CT CHEST, ABDOMEN AND PELVIS WITHOUT CONTRAST TECHNIQUE: Multidetector CT imaging of the chest, abdomen and pelvis was performed following the standard protocol without IV contrast. COMPARISON:  CT of the abdomen on 06/04/2010 and 02/09/2010 FINDINGS: CT CHEST FINDINGS Cardiovascular: There is moderate atherosclerotic calcification of the thoracic aorta. The ascending aorta is aneurysmal, 4.7 centimeters. The noncontrast appearance of the pulmonary arteries is normal. There is dense atherosclerotic calcification of the coronary arteries. Previous CABG. Median sternotomy. Mediastinum/Nodes: The visualized portion of the thyroid gland has a normal appearance. Small hiatal hernia. No mediastinal, hilar, or axillary adenopathy. Lungs/Pleura: Study quality is degraded by patient motion artifact.No suspicious pulmonary nodules. No consolidations, pleural effusions, or pulmonary  edema. Airways are patent. Musculoskeletal: Midthoracic spondylosis.  Median sternotomy. CT ABDOMEN PELVIS FINDINGS Hepatobiliary: The liver is diffusely low attenuation consistent with hepatic steatosis. The gallbladder is distended. No radiopaque calculi are present. Pancreas: Unremarkable. No pancreatic ductal dilatation or surrounding inflammatory changes. Spleen: Normal in size without focal abnormality. Adrenals/Urinary Tract: Adrenal glands are normal in appearance. There is nonspecific perinephric stranding bilaterally, stable in appearance. Intrarenal calcifications or arterial calcifications identified within the kidneys bilaterally. No evidence for hydronephrosis or suspicious renal mass. Urinary bladder is decompressed by a suprapubic catheter. Stomach/Bowel: The stomach and small bowel loops are normal in appearance. There are numerous colonic diverticula. No acute diverticulitis. Significant stool burden. Appendix is not seen and is likely surgically absent. Vascular/Lymphatic: There is atherosclerotic calcification of the abdominal aorta. No associated aneurysm. No retroperitoneal or mesenteric adenopathy. Reproductive: The prostate is enlarged. Other: There is fat within the inguinal canals bilaterally, LEFT greater than RIGHT. Anterior abdominal wall is unremarkable. No free pelvic fluid. Musculoskeletal: There are degenerative changes primarily within the thoracic and lumbar spine. No suspicious lytic or blastic lesions are identified. IMPRESSION: 1. Aortic aneurysm NOS (ICD10-I71.9). Ascending aortic aneurysm, 4.7 centimeters. Recommend semi-annual imaging followup by CTA or MRA and referral to cardiothoracic surgery if not already obtained. This recommendation follows 2010 ACCF/AHA/AATS/ACR/ASA/SCA/SCAI/SIR/STS/SVM Guidelines for the Diagnosis and Management of Patients With Thoracic Aortic Disease. Circulation. 2010; 121: [id number] 2.  Aortic Atherosclerosis (ICD10-I70.0). 3. Small hiatal  hernia. 4. Hepatic steatosis. 5. Distension of the  gallbladder. Although no radiopaque calculi are identified, consider further evaluation with ultrasound if there is also clinical concern for gallbladder disease. 6. Nonspecific and stable bilateral perinephric stranding. No evidence for urinary tract obstruction. Nephrolithiasis versus renal artery calcifications. 7. Significant colonic diverticulosis. 8. Enlarged prostate. 9. Suprapubic catheter. Electronically Signed   By: Nolon Nations M.D.   On: 12/18/2017 13:53   US Abdomen Limited  Result Date: 12/18/2017 CLINICAL DATA:  Right-sided flank pain and nausea EXAM: ULTRASOUND ABDOMEN LIMITED RIGHT UPPER QUADRANT COMPARISON:  CT from earlier in the same day. FINDINGS: Gallbladder: No gallstones or wall thickening visualized. No sonographic Murphy sign noted by sonographer. Common bile duct: Diameter: 4.3 mm. Liver: No focal lesion identified. Within normal limits in parenchymal echogenicity. Portal vein is patent on color Doppler imaging with normal direction of blood flow towards the liver. IMPRESSION: No acute abnormality noted. Electronically Signed   By: Inez Catalina M.D.   On: 12/18/2017 16:06    Procedures CRITICAL CARE Performed by: Charlesetta Shanks   Total critical care time: 60 minutes  Critical care time was exclusive of separately billable procedures and treating other patients.  Critical care was necessary to treat or prevent imminent or life-threatening deterioration.  Critical care was time spent personally by me on the following activities: development of treatment plan with patient and/or surrogate as well as nursing, discussions with consultants, evaluation of patient's response to treatment, examination of patient, obtaining history from patient or surrogate, ordering and performing treatments and interventions, ordering and review of laboratory studies, ordering and review of radiographic studies, pulse oximetry and  re-evaluation of patient's condition. Marland Kitchen.Incision and Drainage Date/Time: 12/18/2017 5:12 PM Performed by: Charlesetta Shanks, MD Authorized by: Charlesetta Shanks, MD   Consent:    Consent obtained:  Verbal   Consent given by:  Patient   Risks discussed:  Bleeding, incomplete drainage, pain and infection Location:    Type:  Fluid collection   Location:  Upper extremity   Upper extremity location:  Elbow   Elbow location:  L elbow Pre-procedure details:    Skin preparation:  Betadine Anesthesia (see MAR for exact dosages):    Anesthesia method:  Local infiltration   Local anesthetic:  Lidocaine 1% w/o epi Procedure type:    Complexity:  Complex Procedure details:    Incision types:  Single straight   Incision depth:  Subcutaneous   Scalpel blade:  11   Drainage:  Serous   Drainage amount:  Copious   Packing materials:  1/4 in iodoform gauze   Amount 1/4" iodoform:  10cm Post-procedure details:    Patient tolerance of procedure:  Tolerated well, no immediate complications Comments:     Draped in sterile fashion.  Incision produced copious amount of serous, thin drainage.  She tolerated well without pain.  Bulky dressing and Covan wrap applied. SUPRAPUBIC TUBE PLACEMENT Date/Time: 12/18/2017 10:51 PM Performed by: Charlesetta Shanks, MD Authorized by: Charlesetta Shanks, MD   Consent:    Consent obtained:  Verbal   Consent given by:  Patient Anesthesia (see MAR for exact dosages):    Anesthesia method:  None Post-procedure details:    Patient tolerance of procedure:  Tolerated well, no immediate complications Comments:     Patient suprapubic catheter changed at request of admitting physician.  They had concern for purulent drainage around the catheter.  47 French catheter deflated and removed without difficulty.  Area cleaned with cloraprep sticks.  Surgilube placed. 20 [place] coud catheter passed without difficulty.  Inflated and  clear urine returned.   (including critical care  time)  Medications Ordered in ED Medications  aspirin chewable tablet 81 mg (has no administration in time range)  amLODipine (NORVASC) tablet 10 mg (10 mg Oral Given 12/18/17 2205)  clopidogrel (PLAVIX) tablet 75 mg (has no administration in time range)  hydrALAZINE (APRESOLINE) tablet 100 mg (has no administration in time range)  isosorbide mononitrate (IMDUR) 24 hr tablet 30 mg (30 mg Oral Given 12/18/17 2205)  labetalol (NORMODYNE) tablet 200 mg (200 mg Oral Given 12/18/17 2205)  enoxaparin (LOVENOX) injection 40 mg (40 mg Subcutaneous Given 12/18/17 2206)  cefTRIAXone (ROCEPHIN) 1 g in sodium chloride 0.9 % 100 mL IVPB (0 g Intravenous Stopped 12/18/17 1610)  0.9 %  sodium chloride infusion ( Intravenous New Bag/Given 12/18/17 1522)  sodium chloride 0.9 % bolus 1,000 mL (0 mLs Intravenous Stopped 12/18/17 2105)    And  sodium chloride 0.9 % bolus 1,000 mL (0 mLs Intravenous Stopped 12/18/17 2105)    And  sodium chloride 0.9 % bolus 1,000 mL (0 mLs Intravenous Stopped 12/18/17 2106)  acetaminophen (TYLENOL) suppository 650 mg (650 mg Rectal Given 12/18/17 1622)  ondansetron (ZOFRAN) injection 4 mg (4 mg Intravenous Given 12/18/17 1523)  lidocaine (PF) (XYLOCAINE) 1 % injection 5 mL (5 mLs Infiltration Given by Other 12/18/17 1646)     Initial Impression / Assessment and Plan / ED Course  I have reviewed the triage vital signs and the nursing notes.  Pertinent labs & imaging results that were available during my care of the patient were reviewed by me and considered in my medical decision making (see chart for details).    Consult: Reviewed with Dr. Stann Mainland of orthopedics.  As patient is getting admitted for septic work-up, advised to make incision in the olecranon bursa and collect specimen.  Drain bursa and packed with iodoform gauze.  Apply compressive dressing.  He will recheck the patient in the morning.  Consult: Internal medicine teaching service dr. Sherry Ruffing for admission.  Final Clinical  Impressions(s) / ED Diagnoses   Final diagnoses:  Sepsis, due to unspecified organism [facility])  Acute cystitis without hematuria  Olecranon bursitis of left elbow   Patient presents with fever and prior diagnosis of UTI.  He does have suprapubic catheter.  Develop flank pain.  Diagnostic work-up does not show surgical intra-abdominal process.  He does have mild perinephric stranding.  At this time I feel that UTI and pyelonephritis are most likely etiologies for sepsis.  Admitting team requested a change suprapubic catheter due to purulence at the ostomy.  This was changed without difficulty, patient tolerated well.  And had a large, soft olecranon bursitis.  This was drained with large amount of clear serous fluid.  No purulent material.  Culture obtained.  I have low suspicion for septic bursitis.  Patient will be admitted for ongoing treatment of sepsis. ED Discharge Orders    None       Charlesetta Shanks, MD 12/18/17 2306

## 2017-12-18 NOTE — ED Notes (Signed)
Patient transported to X-ray 

## 2017-12-18 NOTE — Assessment & Plan Note (Addendum)
BP is quite high today --> increase Hydralazine to 100 mg tid. Next step is to change Labetalol to Carvedilol. Continue Amlodipine @ max dose& restart Hyzaar .

## 2017-12-18 NOTE — ED Notes (Signed)
Pt remains in imaging.

## 2017-12-18 NOTE — H&P (Signed)
Date: 12/18/2017               Patient Name:  [name] MRN: [id number]  DOB: Feb 02, 1931 Age / Sex: 84 y.o., male   PCP: Suzan Garibaldi, [place]         Medical Service: Internal Medicine Teaching Service         Attending Physician: Dr. Oval Linsey, MD    First Contact: Dr. Sherry Ruffing Pager: [phone]  Second Contact: Dr. Hetty Ely Pager: [phone]       After Hours (After 5p/  First Contact Pager: [phone]  weekends / holidays): Second Contact Pager: [phone]   Chief Complaint: Generalized weakness and pain  History of Present Illness:  This is an 84 year old male with a past medical history of hypertension, diabetes mellitus, coronary artery disease(status post PCI with DES in SVG to PDA), CVA presenting with generalized weakness and fatigue for the past 6 to 8 weeks.  He has been having a harder time getting himself up out of the chair.  In the past 3 to 4 weeks he has started generalized nonspecific pain.  He went to the ED 5 days ago and had a urinalysis that showed large leukocytes, nitrites, and more than 50 WBCs.  He was diagnosed with acute cystitis and sent home on Keflex.  He started having right flank pain soreness sensation like a bruise, and occurs only when he touches it, is a 4/10 when he does.  Denies having any flank pain at the moment.  He also endorses some chills, fevers (measured at 100.4) nausea, headache and a dry cough. He had 2 episodes of non-bilious non-bloody emesis yesterday.  He reports a good appetite.  He denies any chest pain, shortness of breath, diarrhea, constipation, or dizziness. Has a suprapubic catheter that was placed 1 year ago.  He reports some tenderness when lying supine but no redness or drainage.  He has never had infection here before.  In the ED patient was found to be febrile to 102.2, tachypneic to 30, HR 80s, and BP up to 164/68. Labs notable for a LA of 2.02. Hepatic function panel normal except for protein 6.3. Lipase 27. BMP showed a BUN 27  and Cr 1.51, near baseline. CBC showed a WBC of 17.8, hgb 10.7 with a MCV of 91.9. Uric acid 8.3. Abdominal ultrasound showed no acute abnormality.   Orthopedics was consulted for a olecranon bursitis his left elbow, x-ray showed soft tissue swelling with no acute osseous abrnormality. They performed a bedside I&D which showed scant serous fluid and no purulence.  They do not think this is the source of his current sepsis and recommended removal of the packing 24 hours after the I&D.  And can follow-up outpatient.  CT abdomen pelvis showed perinephric stranding but no evidence of her Rosanne Gutting tract obstruction, distention of gallbladder however no radiopaque calculi seen.  Patient was given ceftriaxone 2 L normal saline.Patient was admitted to internal medicine.   Meds:  Scheduled Meds: Continuous Infusions: . sodium chloride     PRN Meds:.  Current Meds  Medication Sig  . amLODipine (NORVASC) 10 MG tablet Take 10 mg by mouth every evening.   Marland Kitchen aspirin 81 MG chewable tablet Chew 1 tablet (81 mg total) by mouth daily.  . cephALEXin (KEFLEX) 500 MG capsule Take 1 capsule (500 mg total) by mouth 3 (three) times daily for 7 days.  . clopidogrel (PLAVIX) 75 MG tablet Take 1 tablet (75 mg total) by mouth daily.  Marland Kitchen  doxycycline (MONODOX) 100 MG capsule Take 100 mg by mouth 2 (two) times daily. 10 day course started 12/09/17  . furosemide (LASIX) 20 MG tablet Take 1 tablet (20 mg total) by mouth as needed. (Patient taking differently: Take 20 mg by mouth daily as needed for fluid (swelling). )  . glimepiride (AMARYL) 4 MG tablet Take 4 mg by mouth 2 (two) times daily with a meal.   . hydrALAZINE (APRESOLINE) 100 MG tablet Take 1 tablet (100 mg total) by mouth 3 (three) times daily.  . isosorbide mononitrate (IMDUR) 30 MG 24 hr tablet Take 30 mg by mouth every evening.  . labetalol (NORMODYNE) 200 MG tablet Take 200 mg by mouth 2 (two) times daily.   Marland Kitchen losartan-hydrochlorothiazide (HYZAAR) 100-25 MG tablet  Take 1 tablet by mouth every evening. (Patient taking differently: Take 1 tablet by mouth every morning. )  . metFORMIN (GLUCOPHAGE) 1000 MG tablet Take 1 tablet (1,000 mg total) by mouth 2 (two) times daily.  . nitroGLYCERIN (NITROSTAT) 0.4 MG SL tablet Place 1 tablet (0.4 mg total) under the tongue every 5 (five) minutes as needed for chest pain (up to 3 doses).  Marland Kitchen oxybutynin (DITROPAN) 5 MG tablet Take 5 mg by mouth daily as needed for bladder spasms.      Allergies: Allergies as of 12/18/2017 - Review Complete 12/18/2017  Allergen Reaction Noted  . Other Other (See Comments) 02/15/2013  . Atorvastatin Other (See Comments) 06/19/2014  . Crestor [rosuvastatin] Other (See Comments) 06/27/2014  . Pravastatin Other (See Comments) 02/15/2013  . Simvastatin Other (See Comments) 06/19/2014   Past Medical History:  Diagnosis Date  . Abdominal aortic aneurysm ([place])    a. Aortic duplex 06/2014: mild aneurysmal dilatation of proximal abdominal aorta measuring 3.4x3.4cm. No sig change from 2012. F/u due 06/2016;   . AKI (acute kidney injury) ([place]) 03/28/2016  . Arthritis    "right knee; never bothered me" (03/26/2016)  . 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 CAD in native artery    a. NSTEMI 11/2010 - CABG x2(LIMA to LAD, SVG to PDA). b. NEG Lexi MV 10/24/13, EF 53%, no perfusion abnormality, septal and apical HK noted. c. NSTEMI 05/2014 - s/p DES to SVG-RPDA 06/18/14 (Xience Alpine DES 3.0 x 18 mm -3.35 mm), EF 60-65; d. 03/2016 STEMI/PCI: LM nl, ost LAD 70%, mLCx 50%, pRCA 95% - mRCA 60%, dRPDA 70%, LIMA->LAD ok, o-p SVG->RPDA 100% (3.0x16 Promus DES overlapping prior stent).  . Diastolic dysfunction    a. 03/2016 Echo: EF 60-65%, no rwma, Gr1 DD, triv AI, Ao root 43mm, Asc Ao 49mm, triv MR.  . Dizziness    a. Carotid duplex 03/2014: mild fibrous plaque, no significant stenosis.  . Foley catheter in place    "been wearing it for a couple months now" (03/26/2016)   . HTN (hypertension)   . Hyperlipidemia LDL goal <70   . Non-STEMI (non-ST elevated myocardial infarction) ([place]) 11/2010   1. Ostial LAD 70% (to close to [facility] for PCI), subtotal occlusion of the RCA  . Postoperative atrial fibrillation ([place]) 11/2010   Post CABG, no sign recurrence  . Refusal of blood transfusions as patient is Jehovah's Witness   . 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 Servando Snare)  . Sleep apnea    Not on CPAP. (03/26/2016)  . ST elevation myocardial infarction (STEMI) of inferior wall ([place]) 04/05/2016   Occluded in-stent restenosis/thrombosis of SVG-RCA --> treated  with overlapping Promus DES 3.0 mm 16 mm postdilated 3.0 mm).  . Stroke [facility]) 2014; 01/2015   a. 2014 with mild right hand weakness, nonhemorrhagic per pt.;; b. - PTA-Stent L ICA 95%   . Type II diabetes mellitus ([facility])     Family History: Reports heart problems in father.  Social History: Denies alcohol, tobacco, drug use.  Lives at home with wife and mother-in-law whom he is the primary caregiver for.  Review of Systems: A complete ROS was negative except as per HPI.   Physical Exam: Blood pressure (!) 156/79, pulse 84, temperature (!) 102.2 F (39 C), temperature source Oral, resp. rate 17, SpO2 95 %. General: alert, cooperative, appears stated age and no distress HEENT: PERRLA and oropharynx clear, no lesions Heart:Normal rate, regular rhythm, distant heart sounds, 2/6 systolic murmur heard best at left 2nd intercostal space Lungs: clear to auscultation, no wheezes or rales and unlabored breathing Abdomen: Abdomen soft, no guarding or rigidity, no CVA tenderness, suprapubic catheter in place with some crusting around it and purulent drainage inside catheter, no tenderness around area Extremities: extremities normal, atraumatic, no cyanosis or edema Skin:no rashes, no wounds Neurology: normal without focal findings, mental status, speech normal, alert and oriented x3 and PERLA Psychiatry:  Normal mood and affect   EKG: personally reviewed my interpretation is rate 81 bpm, normal axis, no acute ST elevations or depressions   Assessment & Plan by Problem: This is an 84 year old male with a past medical history of hypertension, diabetes mellitus, coronary artery disease(status post PCI with DES in SVG to PDA), CVA presenting with generalized weakness, fatigue, and abdominal pain.  Sepsis: Patient presented with generalized weakness fatigue and abdominal pain.  He was found to be febrile to 102.2, tachypneic, hypertensive.  His labs showed a lactic acid of 2.02 and a WBC of 17.2.  Sepsis protocol was initiated and patient was given Keflex and 2 L normal saline.  Patient had a olecranon bursa and orthopedics did an I+D however they do not think this is the source of sepsis.  He was seen in the ED 5 days ago and found to have a UTI prescribed Keflex however he did not improve.  Patient does have a suprapubic catheter in place that appears to be draining purulent fluid.  On exam there is no CVA tenderness elicited with catheter showed purulent drainage catheter with no tenderness or erythema around the area.  This is possibly the source of infection.  Patient received ceftriaxone in the ED and 3 L normal saline.  -F/u blood cultures -F/u urine cultures -Change suprapubic catheter -Trend fever curve -CBC in a.m.  Essential hypertension: Blood pressure on admission was up to 164/68.  He was recently seen by his cardiologist 2 days ago and they increased hydralazine 100 mg 3 times daily, patient is on amlodipine and labetalol.  -Continue home medications  Diabetes mellitus: Patient is on glimepiride and metformin at home.  Glucose was 144 on admssion.  -Hold home medications -SSI-Sensitive -Daily CBGs  Coronary artery disease: Patient is on losartan-hydrochlorothiazide and hydralazine at home. -Hold home medications  AKI: BUN 27, Cr 1.51 on admission. Slowly increasing over the past  few months. Patient has received 3L NS while in the ED.  -Monitor for now  FEN: s/p 3L NS, replete lytes prn, NPO VTE ppx: Lovenox Code Status: FULL   Dispo: Admit patient to Inpatient with expected length of stay greater than 2 midnights.  Signed: Asencion Noble, MD 12/18/2017, 8:08 PM  Pager: [phone]

## 2017-12-18 NOTE — ED Notes (Signed)
Pt reports feeling nauseated. 

## 2017-12-18 NOTE — Discharge Summary (Signed)
Name: [name] MRN: [id number] DOB: [birth year] 84 y.o. PCP: Suzan Garibaldi, FNP  Date of Admission: 12/18/2017 10:29 AM Date of Discharge: 12/22/17 2:09 PM Attending Physician: Oval Linsey, MD  Discharge Diagnosis: 1. Pyelonephritis 2. Chest pain 3. Testicular pain and firmness  Discharge Medications: Allergies as of 12/22/2017      Reactions   Other Other (See Comments)   No  BLOOD PRODUCTS - Pt is Jehovah's Witness   Atorvastatin Other (See Comments)   Myalgias   Crestor [rosuvastatin] Other (See Comments)   Myalgias    Pravastatin Other (See Comments)   Myalgias   Simvastatin Other (See Comments)   Myalgias      Medication List    STOP taking these medications   cephALEXin 500 MG capsule Commonly known as:  KEFLEX   doxycycline 100 MG capsule Commonly known as:  MONODOX   labetalol 200 MG tablet Commonly known as:  NORMODYNE     TAKE these medications   amLODipine 10 MG tablet Commonly known as:  NORVASC Take 10 mg by mouth every evening.   aspirin 81 MG chewable tablet Chew 1 tablet (81 mg total) by mouth daily.   carvedilol 12.5 MG tablet Commonly known as:  COREG Take 1 tablet (12.5 mg total) by mouth 2 (two) times daily with a meal.   ciprofloxacin 500 MG tablet Commonly known as:  CIPRO Take 1 tablet (500 mg total) by mouth 2 (two) times daily for 5 days.   clopidogrel 75 MG tablet Commonly known as:  PLAVIX Take 1 tablet (75 mg total) by mouth daily.   furosemide 20 MG tablet Commonly known as:  LASIX Take 1 tablet (20 mg total) by mouth as needed. What changed:    when to take this  reasons to take this   glimepiride 4 MG tablet Commonly known as:  AMARYL Take 4 mg by mouth 2 (two) times daily with a meal.   hydrALAZINE 100 MG tablet Commonly known as:  APRESOLINE Take 1 tablet (100 mg total) by mouth 3 (three) times daily.   isosorbide mononitrate 60 MG 24 hr tablet Commonly known as:  IMDUR Take 1 tablet (60 mg total)  by mouth daily. What changed:  Another medication with the same name was changed. Make sure you understand how and when to take each.   isosorbide mononitrate 60 MG 24 hr tablet Commonly known as:  IMDUR Take 1 tablet (60 mg total) by mouth every evening. What changed:    medication strength  how much to take   losartan-hydrochlorothiazide 100-25 MG tablet Commonly known as:  HYZAAR Take 1 tablet by mouth every evening. What changed:  when to take this   metFORMIN 1000 MG tablet Commonly known as:  GLUCOPHAGE Take 1 tablet (1,000 mg total) by mouth 2 (two) times daily.   nitroGLYCERIN 0.4 MG SL tablet Commonly known as:  NITROSTAT Place 1 tablet (0.4 mg total) under the tongue every 5 (five) minutes as needed for chest pain (up to 3 doses).   oxybutynin 5 MG tablet Commonly known as:  DITROPAN Take 5 mg by mouth daily as needed for bladder spasms.       Disposition and follow-up:   RichardEduardo L [name] was discharged from [facility] in Good condition.  At the hospital follow up visit please address:  1.  Completion of antibiotics for pyelonephritis.  Chest pain. He should have appointment to see cardiology.  He should have an appointment/have seen urology to  follow up.    2.  Labs / imaging needed at time of follow-up: None  3.  Pending labs/ test needing follow-up: None  Follow-up Appointments: Follow-up Information    Suzan Garibaldi, [place]. Go in 9 day(s).   Specialty:  Nurse Practitioner Why:  Follow up on Thursday August 15th at 9:30 with your PCP.  Contact information: [place] [address] [facility]. Go in 10 day(s).   Why:  August 16th @ 10:30 with Jiles Crocker Contact information: WPYKD:[phone]       [facility], Tami Lin, PA Follow up.   Specialties:  Physician Assistant, Cardiology, Radiology Why:  You have an appointment with Fabian Sharp, PA  for cardiology hospital follow-up on  August 27 at 8:30.  Please arrive 15 minutes early for check-in Contact information: [address] [place] [address] [phone]           Hospital Course by problem list: 1.  Pyelonephritis: This is an 84 year old male with a PMH of CAD (s/p CABG), CVA, HTN, diabetes mellitus who presented with generalized weakness, fatigue, and abdominal pain. He had recently been diagnosed with a UTI 5 days prior to admission and treated for it, however he continued to feel worse. He was febrile to 102.2, tachypneic, with HTN. He did have a olecrenon bursa on his left elbow, orthopedics evaluated and did not think that this was the source of the infection, they did do an I+D and sent it for cultures, the cultures were negative. Found to have findings suggestive of pyelonephritis including elevated WBC, CT showed perinephric stranding, U/A with +nitrites, small leukocytes, 21-50 WBC. lood cultures had no growth x3 days. A urine culture showed >100,000 pseudomonas. BHe was intially put on ceftriaxone but switched to cefepime after this result. He continued to improve objectively and subjectively. He was switched from cefepime to oral ciprofloxacin and discharged on another 5 days of ciprofloxacin.   Chest pain:  Patient started having chest pain on day 3 of admission, substernal that occurred at rest associated with some SOB. He was given nitroglycerin which improved the symptoms. His EKG showed the old inferior infarct, troponins were elevated to 0.04, 0.04 and 0.05. Cardiology was consulted who thought that this was likely due to his poorly controlled diabetes, he had a cath 3 months prior which showed a patent stent and did not want to pursue any further ischemic work up, they increased his imdur to his home dose of 60mg  daily (patietn reported that he had not been taking), restarted hydralazine (which was held due to [facility]), and switched labetalol to carvedilol. He did not have any more episodes  of chest pain during the admission. Will follow up with cardiology outpatient.   Testicular pain and firmness: Patient complained of pain and firmness, he reported that it had been going on for awhile. We got an ultrasound which showed benign hydroceles and a right varicocele. He will follow up with urology outpatient.   DM: Placed on SSI-sensitive while hospitalized. No changes to discharge medications.    Discharge Vitals:   BP 140/68 (BP Location: Left Arm)   Pulse (!) 59   Temp 98.6 F (37 C) (Oral)   Resp 20   Ht 6\' 1"  (1.854 m)   Wt 220 lb 7.4 oz (100 kg)   SpO2 93%   BMI 29.09 kg/m   Pertinent Labs, Studies, and Procedures:  CT abdomen/pelvis:  IMPRESSION: 1.  Aortic aneurysm NOS (ICD10-I71.9). Ascending aortic aneurysm, 4.7 centimeters. Recommend semi-annual imaging followup by CTA or MRA and referral to cardiothoracic surgery if not already obtained. This recommendation follows 2010 ACCF/AHA/AATS/ACR/ASA/SCA/SCAI/SIR/STS/SVM Guidelines for the Diagnosis and Management of Patients With Thoracic Aortic Disease. Circulation. 2010; 121: B201-E071 2.  Aortic Atherosclerosis (ICD10-I70.0). 3. Small hiatal hernia. 4. Hepatic steatosis. 5. Distension of the gallbladder. Although no radiopaque calculi are identified, consider further evaluation with ultrasound if there is also clinical concern for gallbladder disease. 6. Nonspecific and stable bilateral perinephric stranding. No evidence for urinary tract obstruction. Nephrolithiasis versus renal artery calcifications. 7. Significant colonic diverticulosis. 8. Enlarged prostate. 9. Suprapubic catheter.  Discharge Instructions: Discharge Instructions    Call MD for:  redness, tenderness, or signs of infection (pain, swelling, redness, odor or green/yellow discharge around incision site)   Complete by:  As directed    Call MD for:  severe uncontrolled pain   Complete by:  As directed    Call MD for:  temperature >100.4    Complete by:  As directed    Diet - low sodium heart healthy   Complete by:  As directed    Increase activity slowly   Complete by:  As directed       Signed: Asencion Noble, MD 12/22/2017, 2:09 PM   Pager: [phone]

## 2017-12-18 NOTE — Assessment & Plan Note (Signed)
Most recent AAA duplex did not see the 3.4 x 3.4 cm dilation --> recheck in ~ 2 yrs.

## 2017-12-18 NOTE — Assessment & Plan Note (Signed)
Severe native CAD by recent cath but widely patent stents in his graft.  Patent LIMA.  This was just in April.  Therefore not likely that he is having anginal symptoms by blood pressure.  I think his chest discomfort recently. Plan: Continue amlodipine and beta-blocker, would like to convert to carvedilol. On Plavix and aspirin for combination of CAD and cerebrovascular disease.  not on statin - but has had relatively controlled lipids

## 2017-12-18 NOTE — Assessment & Plan Note (Signed)
PCI of SVG-RCA (overlapping DES stents with 2nd one for ISR).  On ASA & Plavix for combination of CAD & Cerebrovascular diseaes. - OK for hold Plavix for procedures.

## 2017-12-18 NOTE — ED Triage Notes (Signed)
Pt to ER for evaluation of right flank pain onset 2 days ago with nausea and vomiting. Reports last week was diagnosed with UTI, started on doxycycline and ultimately switched to keflex, family reports patient is not getting any better, he has began to run fevers (measured at 102 at the highest). Pt in NAD at this time.

## 2017-12-18 NOTE — Consult Note (Signed)
ORTHOPAEDIC CONSULTATION  REQUESTING PHYSICIAN: Oval Linsey, MD  PCP:  Suzan Garibaldi, FNP  Chief Complaint: Olecranon bursitis left arm  HPI: [name] is a 84 y.o. male who complains of increasing fatigue and weakness over the last 6 days.  He does state that he has been diagnosed recently with a urinary tract infection.  He does have a suprapubic catheter in place.  He was started on Keflex.  He was feeling better initially however 2 days prior to presentation he had increasing weakness and some nausea.  He has had a subjective fever that is increasing.  He has a little bit of flank pain as well in the right.  On his evaluation in the emergency department he was found to have some benign-appearing olecranon bursitis on the left.  I had a conversation with Dr. Johnney Killian in the ED, and recommended that she do a bedside I&D which has been done.  She reports to me in the patient that there was some scant serous fluid that came out and this was packed uneventfully.  No purulence noted.  Past Medical History:  Diagnosis Date  . Abdominal aortic aneurysm ([place])    a. Aortic duplex 06/2014: mild aneurysmal dilatation of proximal abdominal aorta measuring 3.4x3.4cm. No sig change from 2012. F/u due 06/2016;   . AKI (acute kidney injury) ([place]) 03/28/2016  . Arthritis    "right knee; never bothered me" (03/26/2016)  . 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 CAD in native artery    a. NSTEMI 11/2010 - CABG x2(LIMA to LAD, SVG to PDA). b. NEG Lexi MV 10/24/13, EF 53%, no perfusion abnormality, septal and apical HK noted. c. NSTEMI 05/2014 - s/p DES to SVG-RPDA 06/18/14 (Xience Alpine DES 3.0 x 18 mm -3.35 mm), EF 60-65; d. 03/2016 STEMI/PCI: LM nl, ost LAD 70%, mLCx 50%, pRCA 95% - mRCA 60%, dRPDA 70%, LIMA->LAD ok, o-p SVG->RPDA 100% (3.0x16 Promus DES overlapping prior stent).  . Diastolic dysfunction    a. 03/2016 Echo: EF 60-65%, no rwma, Gr1 DD, triv AI, Ao  root 89mm, Asc Ao 60mm, triv MR.  . Dizziness    a. Carotid duplex 03/2014: mild fibrous plaque, no significant stenosis.  . Foley catheter in place    "been wearing it for a couple months now" (03/26/2016)  . HTN (hypertension)   . Hyperlipidemia LDL goal <70   . Non-STEMI (non-ST elevated myocardial infarction) ([place]) 11/2010   1. Ostial LAD 70% (to close to [facility] for PCI), subtotal occlusion of the RCA  . Postoperative atrial fibrillation ([place]) 11/2010   Post CABG, no sign recurrence  . Refusal of blood transfusions as patient is Jehovah's Witness   . 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 Servando Snare)  . Sleep apnea    Not on CPAP. (03/26/2016)  . ST elevation myocardial infarction (STEMI) of inferior wall ([place]) 04/05/2016   Occluded in-stent restenosis/thrombosis of SVG-RCA --> treated with overlapping Promus DES 3.0 mm 16 mm postdilated 3.0 mm).  . Stroke [facility]) 2014; 01/2015   a. 2014 with mild right hand weakness, nonhemorrhagic per pt.;; b. - PTA-Stent L ICA 95%   . Type II diabetes mellitus ([place])    Past Surgical History:  Procedure Laterality Date  . APPENDECTOMY    . CARDIAC CATHETERIZATION  12/11/2010   Dr. Chase Picket - subsequent cath - normal LV systolic function, no renal artery stenosis, severe 2-vessel disease with  subtotaled RCA prox and distal 60% lesion and complex 70% area of narrowing of ostium of LAD  . CARDIAC CATHETERIZATION N/A 03/26/2016   Procedure: Left Heart Cath and Coronary Angiography;  Surgeon: Nelva Bush, MD;  Location: [place] CV LAB;  Service: Cardiovascular;  Laterality: N/A;  . CARDIAC CATHETERIZATION N/A 03/26/2016   Procedure: Coronary Stent Intervention;  Surgeon: Nelva Bush, MD;  Location: [place] CV LAB;  Service: Cardiovascular: 100% In-stent thrombosis of pros SVG-RCA (Xience DES) --> treated with PromusDES 3.0 x 18 (3.3 mm)  . CARDIAC CATHETERIZATION N/A 03/26/2016   Procedure: Bypass Graft Angiography;  Surgeon:  Nelva Bush, MD;  Location: [place] CV LAB;  Service: Cardiovascular;  Laterality: N/A;  . CORONARY ANGIOPLASTY WITH STENT PLACEMENT  06/18/2014   PCI to SVG-RPDA 06/18/14 (Xience Alpine DES 3.0 x 18 mm -3.35 mm),   . CORONARY ARTERY BYPASS GRAFT  12/15/2010   X2, Dr Servando Snare; LIMA to LAD, SVG to PDA;   . CORONARY/GRAFT ANGIOGRAPHY N/A 08/27/2017   Procedure: CORONARY/GRAFT ANGIOGRAPHY;  Surgeon: Nelva Bush, MD;  Location: [place] CV LAB;  Service: Cardiovascular;  Laterality: N/A;  . CYSTOSCOPY WITH URETHRAL DILATATION    . IR ANGIO INTRA EXTRACRAN SEL COM CAROTID INNOMINATE BILAT MOD SED  02/25/2017  . IR ANGIO INTRA EXTRACRAN SEL COM CAROTID INNOMINATE BILAT MOD SED  10/19/2017  . IR ANGIO VERTEBRAL SEL SUBCLAVIAN INNOMINATE UNI L MOD SED  02/25/2017  . IR ANGIO VERTEBRAL SEL SUBCLAVIAN INNOMINATE UNI R MOD SED  10/19/2017  . IR ANGIO VERTEBRAL SEL VERTEBRAL UNI L MOD SED  10/19/2017  . IR GENERIC HISTORICAL  01/21/2016   IR RADIOLOGIST EVAL & MGMT 01/21/2016 [facility]INTER[facility] RAD  . IR GENERIC HISTORICAL  02/03/2016   IR CATHETER TUBE CHANGE 02/03/2016 Marybelle Killings, MD [facility]-INTERV RAD  . IR RADIOLOGIST EVAL & MGMT  11/09/2017  . LEFT HEART CATHETERIZATION WITH CORONARY ANGIOGRAM N/A 06/18/2014   Procedure: LEFT HEART CATHETERIZATION WITH CORONARY ANGIOGRAM;  Surgeon: Leonie Man, MD;  Location: [facility] CATH LAB;  Service: Cardiovascular;  -- severe disease of SVG-rPDA  . RADIOLOGY WITH ANESTHESIA N/A 01/24/2015   Procedure: STENT ASSISTED ANGIOPLASTY (RADIOLOGY WITH ANESTHESIA);  Surgeon: Luanne Bras, MD;  Location: [place];  Service: Radiology;  Laterality: N/A;  . TONSILLECTOMY    . TRANSTHORACIC ECHOCARDIOGRAM  07/28/2012; 03/25/2015:   a. EF 55-60%, severe Conc LVH, Nl Systolic fxn, G1 DD,  Ao Sclerosis w/o stenosis; CRO PFO;; b. Moderate concentric LVH. EF 60-65%. No RWMA, Gr 1 DD. Mild LA dilation  . TRANSTHORACIC ECHOCARDIOGRAM  03/2016   Hypokinesis of the distal inferior wall. On her  concentric hypertrophy. EF 60-65%.  GR 1 DD. Ascending aorta/root 45-46 mm.   Social History   Socioeconomic History  . Marital status: Married    Spouse name: [name]  . Number of children: 1  . Years of education: Not on file  . Highest education level: Not on file  Occupational History    Employer: RETIRED  Social Needs  . Financial resource strain: Not on file  . Food insecurity:    Worry: Not on file    Inability: Not on file  . Transportation needs:    Medical: Not on file    Non-medical: Not on file  Tobacco Use  . Smoking status: Former Smoker    Packs/day: 0.33    Years: 10.00    Pack years: 3.30    Types: Cigarettes    Last attempt to quit: 1961  Years since quitting: 58.6  . Smokeless tobacco: Never Used  Substance and Sexual Activity  . Alcohol use: No  . Drug use: No  . Sexual activity: Never  Lifestyle  . Physical activity:    Days per week: Not on file    Minutes per session: Not on file  . Stress: Not on file  Relationships  . Social connections:    Talks on phone: Not on file    Gets together: Not on file    Attends religious service: Not on file    Active member of club or organization: Not on file    Attends meetings of clubs or organizations: Not on file    Relationship status: Not on file  Other Topics Concern  . Not on file  Social History Narrative   Married father of one.  Previously uses stationary bike routinely.  Now reduced due to other social stressors.   Quit smoking 50 years ago.  Does not drink alcohol   Family History  Problem Relation Age of Onset  . Heart Problems Father 71   Allergies  Allergen Reactions  . Other Other (See Comments)    No  BLOOD PRODUCTS - Pt is Jehovah's Witness  . Atorvastatin Other (See Comments)    Myalgias  . Crestor [Rosuvastatin] Other (See Comments)    Myalgias   . Pravastatin Other (See Comments)    Myalgias  . Simvastatin Other (See Comments)    Myalgias    Prior to Admission  medications   Medication Sig Start Date End Date Taking? Authorizing Provider  amLODipine (NORVASC) 10 MG tablet Take 10 mg by mouth every evening.    Yes [provider]  aspirin 81 MG chewable tablet Chew 1 tablet (81 mg total) by mouth daily. 03/29/16  Yes Isaiah Serge, NP  cephALEXin (KEFLEX) 500 MG capsule Take 1 capsule (500 mg total) by mouth 3 (three) times daily for 7 days. 12/13/17 12/20/17 Yes Bero, Barth Kirks, MD  clopidogrel (PLAVIX) 75 MG tablet Take 1 tablet (75 mg total) by mouth daily. 09/22/17  Yes Croitoru, Mihai, MD  doxycycline (MONODOX) 100 MG capsule Take 100 mg by mouth 2 (two) times daily. 10 day course started 12/09/17 12/03/17  Yes [provider]  furosemide (LASIX) 20 MG tablet Take 1 tablet (20 mg total) by mouth as needed. Patient taking differently: Take 20 mg by mouth daily as needed for fluid (swelling).  09/23/17 12/22/17 Yes Almyra Deforest, PA  glimepiride (AMARYL) 4 MG tablet Take 4 mg by mouth 2 (two) times daily with a meal.    Yes [provider]  hydrALAZINE (APRESOLINE) 100 MG tablet Take 1 tablet (100 mg total) by mouth 3 (three) times daily. 12/16/17  Yes Leonie Man, MD  isosorbide mononitrate (IMDUR) 30 MG 24 hr tablet Take 30 mg by mouth every evening.   Yes [provider]  labetalol (NORMODYNE) 200 MG tablet Take 200 mg by mouth 2 (two) times daily.    Yes [provider]  losartan-hydrochlorothiazide (HYZAAR) 100-25 MG tablet Take 1 tablet by mouth every evening. Patient taking differently: Take 1 tablet by mouth every morning.  12/16/17  Yes Leonie Man, MD  metFORMIN (GLUCOPHAGE) 1000 MG tablet Take 1 tablet (1,000 mg total) by mouth 2 (two) times daily. 03/29/16  Yes Isaiah Serge, NP  nitroGLYCERIN (NITROSTAT) 0.4 MG SL tablet Place 1 tablet (0.4 mg total) under the tongue every 5 (five) minutes as needed for chest pain (up  to 3 doses). 08/25/17  Yes Almyra Deforest, PA  oxybutynin (DITROPAN) 5 MG tablet Take 5 mg  by mouth daily as needed for bladder spasms.  09/07/17  Yes [provider]  isosorbide mononitrate (IMDUR) 60 MG 24 hr tablet Take 1 tablet (60 mg total) by mouth daily. Patient not taking: Reported on 12/18/2017 08/28/17   Erma Heritage, PA-C   Ct Abdomen Pelvis Wo Contrast  Result Date: 12/18/2017 CLINICAL DATA:  Pt to ER for evaluation of right flank pain onset 2 days ago with nausea and vomiting. Reports last week was diagnosed with UTI, started on doxycycline and ultimately switched to keflex, family reports patient is not getting any better EXAM: CT CHEST, ABDOMEN AND PELVIS WITHOUT CONTRAST TECHNIQUE: Multidetector CT imaging of the chest, abdomen and pelvis was performed following the standard protocol without IV contrast. COMPARISON:  CT of the abdomen on 06/04/2010 and 02/09/2010 FINDINGS: CT CHEST FINDINGS Cardiovascular: There is moderate atherosclerotic calcification of the thoracic aorta. The ascending aorta is aneurysmal, 4.7 centimeters. The noncontrast appearance of the pulmonary arteries is normal. There is dense atherosclerotic calcification of the coronary arteries. Previous CABG. Median sternotomy. Mediastinum/Nodes: The visualized portion of the thyroid gland has a normal appearance. Small hiatal hernia. No mediastinal, hilar, or axillary adenopathy. Lungs/Pleura: Study quality is degraded by patient motion artifact.No suspicious pulmonary nodules. No consolidations, pleural effusions, or pulmonary edema. Airways are patent. Musculoskeletal: Midthoracic spondylosis.  Median sternotomy. CT ABDOMEN PELVIS FINDINGS Hepatobiliary: The liver is diffusely low attenuation consistent with hepatic steatosis. The gallbladder is distended. No radiopaque calculi are present. Pancreas: Unremarkable. No pancreatic ductal dilatation or surrounding inflammatory changes. Spleen: Normal in size without focal abnormality. Adrenals/Urinary Tract: Adrenal glands are normal in appearance. There is  nonspecific perinephric stranding bilaterally, stable in appearance. Intrarenal calcifications or arterial calcifications identified within the kidneys bilaterally. No evidence for hydronephrosis or suspicious renal mass. Urinary bladder is decompressed by a suprapubic catheter. Stomach/Bowel: The stomach and small bowel loops are normal in appearance. There are numerous colonic diverticula. No acute diverticulitis. Significant stool burden. Appendix is not seen and is likely surgically absent. Vascular/Lymphatic: There is atherosclerotic calcification of the abdominal aorta. No associated aneurysm. No retroperitoneal or mesenteric adenopathy. Reproductive: The prostate is enlarged. Other: There is fat within the inguinal canals bilaterally, LEFT greater than RIGHT. Anterior abdominal wall is unremarkable. No free pelvic fluid. Musculoskeletal: There are degenerative changes primarily within the thoracic and lumbar spine. No suspicious lytic or blastic lesions are identified. IMPRESSION: 1. Aortic aneurysm NOS (ICD10-I71.9). Ascending aortic aneurysm, 4.7 centimeters. Recommend semi-annual imaging followup by CTA or MRA and referral to cardiothoracic surgery if not already obtained. This recommendation follows 2010 ACCF/AHA/AATS/ACR/ASA/SCA/SCAI/SIR/STS/SVM Guidelines for the Diagnosis and Management of Patients With Thoracic Aortic Disease. Circulation. 2010; 121: [id number] 2.  Aortic Atherosclerosis (ICD10-I70.0). 3. Small hiatal hernia. 4. Hepatic steatosis. 5. Distension of the gallbladder. Although no radiopaque calculi are identified, consider further evaluation with ultrasound if there is also clinical concern for gallbladder disease. 6. Nonspecific and stable bilateral perinephric stranding. No evidence for urinary tract obstruction. Nephrolithiasis versus renal artery calcifications. 7. Significant colonic diverticulosis. 8. Enlarged prostate. 9. Suprapubic catheter. Electronically Signed   By: Nolon Nations M.D.   On: 12/18/2017 13:53   Dg Elbow Complete Left  Result Date: 12/18/2017 CLINICAL DATA:  2-3 days ago started having swelling of his posterior elbow. No known injury. No redness or pain. EXAM: LEFT ELBOW - COMPLETE 3+ VIEW COMPARISON:  None. FINDINGS: There is  no evidence of fracture, dislocation, or joint effusion. There are mild degenerative changes of the elbow. Note is made of soft tissue swelling along the posterior aspect of the olecranon. IMPRESSION: Soft tissue swelling.  No evidence for acute osseous abnormality. Electronically Signed   By: Nolon Nations M.D.   On: 12/18/2017 14:37   Ct Chest Wo Contrast  Result Date: 12/18/2017 CLINICAL DATA:  Pt to ER for evaluation of right flank pain onset 2 days ago with nausea and vomiting. Reports last week was diagnosed with UTI, started on doxycycline and ultimately switched to keflex, family reports patient is not getting any better EXAM: CT CHEST, ABDOMEN AND PELVIS WITHOUT CONTRAST TECHNIQUE: Multidetector CT imaging of the chest, abdomen and pelvis was performed following the standard protocol without IV contrast. COMPARISON:  CT of the abdomen on 06/04/2010 and 02/09/2010 FINDINGS: CT CHEST FINDINGS Cardiovascular: There is moderate atherosclerotic calcification of the thoracic aorta. The ascending aorta is aneurysmal, 4.7 centimeters. The noncontrast appearance of the pulmonary arteries is normal. There is dense atherosclerotic calcification of the coronary arteries. Previous CABG. Median sternotomy. Mediastinum/Nodes: The visualized portion of the thyroid gland has a normal appearance. Small hiatal hernia. No mediastinal, hilar, or axillary adenopathy. Lungs/Pleura: Study quality is degraded by patient motion artifact.No suspicious pulmonary nodules. No consolidations, pleural effusions, or pulmonary edema. Airways are patent. Musculoskeletal: Midthoracic spondylosis.  Median sternotomy. CT ABDOMEN PELVIS FINDINGS Hepatobiliary: The liver  is diffusely low attenuation consistent with hepatic steatosis. The gallbladder is distended. No radiopaque calculi are present. Pancreas: Unremarkable. No pancreatic ductal dilatation or surrounding inflammatory changes. Spleen: Normal in size without focal abnormality. Adrenals/Urinary Tract: Adrenal glands are normal in appearance. There is nonspecific perinephric stranding bilaterally, stable in appearance. Intrarenal calcifications or arterial calcifications identified within the kidneys bilaterally. No evidence for hydronephrosis or suspicious renal mass. Urinary bladder is decompressed by a suprapubic catheter. Stomach/Bowel: The stomach and small bowel loops are normal in appearance. There are numerous colonic diverticula. No acute diverticulitis. Significant stool burden. Appendix is not seen and is likely surgically absent. Vascular/Lymphatic: There is atherosclerotic calcification of the abdominal aorta. No associated aneurysm. No retroperitoneal or mesenteric adenopathy. Reproductive: The prostate is enlarged. Other: There is fat within the inguinal canals bilaterally, LEFT greater than RIGHT. Anterior abdominal wall is unremarkable. No free pelvic fluid. Musculoskeletal: There are degenerative changes primarily within the thoracic and lumbar spine. No suspicious lytic or blastic lesions are identified. IMPRESSION: 1. Aortic aneurysm NOS (ICD10-I71.9). Ascending aortic aneurysm, 4.7 centimeters. Recommend semi-annual imaging followup by CTA or MRA and referral to cardiothoracic surgery if not already obtained. This recommendation follows 2010 ACCF/AHA/AATS/ACR/ASA/SCA/SCAI/SIR/STS/SVM Guidelines for the Diagnosis and Management of Patients With Thoracic Aortic Disease. Circulation. 2010; 121: Z610-R604 2.  Aortic Atherosclerosis (ICD10-I70.0). 3. Small hiatal hernia. 4. Hepatic steatosis. 5. Distension of the gallbladder. Although no radiopaque calculi are identified, consider further evaluation with  ultrasound if there is also clinical concern for gallbladder disease. 6. Nonspecific and stable bilateral perinephric stranding. No evidence for urinary tract obstruction. Nephrolithiasis versus renal artery calcifications. 7. Significant colonic diverticulosis. 8. Enlarged prostate. 9. Suprapubic catheter. Electronically Signed   By: Nolon Nations M.D.   On: 12/18/2017 13:53   US Abdomen Limited  Result Date: 12/18/2017 CLINICAL DATA:  Right-sided flank pain and nausea EXAM: ULTRASOUND ABDOMEN LIMITED RIGHT UPPER QUADRANT COMPARISON:  CT from earlier in the same day. FINDINGS: Gallbladder: No gallstones or wall thickening visualized. No sonographic Murphy sign noted by sonographer. Common bile duct: Diameter: 4.3 mm.  Liver: No focal lesion identified. Within normal limits in parenchymal echogenicity. Portal vein is patent on color Doppler imaging with normal direction of blood flow towards the liver. IMPRESSION: No acute abnormality noted. Electronically Signed   By: Inez Catalina M.D.   On: 12/18/2017 16:06    Positive ROS: All other systems have been reviewed and were otherwise negative with the exception of those mentioned in the HPI and as above.  Physical Exam: General: Alert, no acute distress Cardiovascular: No pedal edema Respiratory: No cyanosis, no use of accessory musculature GI: No organomegaly, abdomen is soft and non-tender Skin: No lesions in the area of chief complaint Neurologic: Sensation intact distally Psychiatric: Patient is competent for consent with normal mood and affect Lymphatic: No axillary or cervical lymphadenopathy  MUSCULOSKELETAL:  Left upper extremity:  He has a compressive dressing in place over the elbow.  I did not remove that.  Otherwise with active and passive range of motion at the elbow he has no pain.  He is neurovascular intact throughout.  He has no tenderness about the proximal ulnar distal humerus.  Assessment: Left elbow aseptic olecranon  bursitis.  Plan: -Based on my discussion with the emergency department provider and the fluid that was retrieved from the olecranon this is a benign aseptic appearing olecranon bursitis.  I do not anticipate that this would be the source of his current sepsis.  It has been drained however which would be the treatment of choice if it was. -My recommendation would be for removal of the packing 24 hours after the I&D.  And then he should begin warm Dial soap soaks twice daily with warm water and antibacterial Dial soap.  A nonsterile dry dressing should be applied after those soap soaks to allow for healing via secondary intent. -I am happy to see him in the office in follow-up to assure that the wound bed heals completely and 10 days to 2 weeks following discharge.    Nicholes Stairs, MD Cell [phone]    12/18/2017 7:13 PM

## 2017-12-18 NOTE — Assessment & Plan Note (Signed)
Seen in Carotid Angiogram by Dr. Estanislado Pandy - not sure what the plan is going forward. Was supposed to follow-up with Dr. Estanislado Pandy in June - but has not been seen.

## 2017-12-19 DIAGNOSIS — N3000 Acute cystitis without hematuria: Secondary | ICD-10-CM

## 2017-12-19 DIAGNOSIS — Z7984 Long term (current) use of oral hypoglycemic drugs: Secondary | ICD-10-CM

## 2017-12-19 DIAGNOSIS — Z960 Presence of urogenital implants: Secondary | ICD-10-CM

## 2017-12-19 DIAGNOSIS — A4152 Sepsis due to Pseudomonas: Secondary | ICD-10-CM

## 2017-12-19 DIAGNOSIS — Z87440 Personal history of urinary (tract) infections: Secondary | ICD-10-CM

## 2017-12-19 DIAGNOSIS — Z951 Presence of aortocoronary bypass graft: Secondary | ICD-10-CM

## 2017-12-19 DIAGNOSIS — Z79899 Other long term (current) drug therapy: Secondary | ICD-10-CM

## 2017-12-19 DIAGNOSIS — Z9861 Coronary angioplasty status: Secondary | ICD-10-CM

## 2017-12-19 DIAGNOSIS — N10 Acute tubulo-interstitial nephritis: Secondary | ICD-10-CM

## 2017-12-19 DIAGNOSIS — I440 Atrioventricular block, first degree: Secondary | ICD-10-CM

## 2017-12-19 DIAGNOSIS — N179 Acute kidney failure, unspecified: Secondary | ICD-10-CM

## 2017-12-19 DIAGNOSIS — B965 Pseudomonas (aeruginosa) (mallei) (pseudomallei) as the cause of diseases classified elsewhere: Secondary | ICD-10-CM

## 2017-12-19 DIAGNOSIS — R6510 Systemic inflammatory response syndrome (SIRS) of non-infectious origin without acute organ dysfunction: Secondary | ICD-10-CM

## 2017-12-19 DIAGNOSIS — I69351 Hemiplegia and hemiparesis following cerebral infarction affecting right dominant side: Secondary | ICD-10-CM

## 2017-12-19 DIAGNOSIS — E119 Type 2 diabetes mellitus without complications: Secondary | ICD-10-CM

## 2017-12-19 LAB — CBC
HCT: 27.8 % — ABNORMAL LOW (ref 39.0–52.0)
HEMOGLOBIN: 8.9 g/dL — AB (ref 13.0–17.0)
MCH: 29.6 pg (ref 26.0–34.0)
MCHC: 32 g/dL (ref 30.0–36.0)
MCV: 92.4 fL (ref 78.0–100.0)
PLATELETS: 228 10*3/uL (ref 150–400)
RBC: 3.01 MIL/uL — ABNORMAL LOW (ref 4.22–5.81)
RDW: 17 % — ABNORMAL HIGH (ref 11.5–15.5)
WBC: 29.3 10*3/uL — ABNORMAL HIGH (ref 4.0–10.5)

## 2017-12-19 LAB — BASIC METABOLIC PANEL
Anion gap: 7 (ref 5–15)
BUN: 26 mg/dL — ABNORMAL HIGH (ref 8–23)
CALCIUM: 8 mg/dL — AB (ref 8.9–10.3)
CO2: 24 mmol/L (ref 22–32)
CREATININE: 1.92 mg/dL — AB (ref 0.61–1.24)
Chloride: 108 mmol/L (ref 98–111)
GFR calc Af Amer: 35 mL/min — ABNORMAL LOW (ref >60)
GFR calc non Af Amer: 30 mL/min — ABNORMAL LOW (ref >60)
Glucose, Bld: 151 mg/dL — ABNORMAL HIGH (ref 70–99)
Potassium: 4.2 mmol/L (ref 3.5–5.1)
Sodium: 139 mmol/L (ref 135–145)

## 2017-12-19 LAB — GLUCOSE, CAPILLARY
GLUCOSE-CAPILLARY: 185 mg/dL — AB (ref 70–99)
GLUCOSE-CAPILLARY: 184 mg/dL — AB (ref 70–99)

## 2017-12-19 MED ORDER — SODIUM CHLORIDE 0.9 % IV SOLN
1.0000 g | INTRAVENOUS | Status: DC
  Administered 2017-12-19: 1 g via INTRAVENOUS
  Filled 2017-12-19 (×3): qty 1

## 2017-12-19 MED ORDER — INSULIN ASPART 100 UNIT/ML ~~LOC~~ SOLN
0.0000 [IU] | Freq: Every day | SUBCUTANEOUS | Status: DC
  Administered 2017-12-20: 2 [IU] via SUBCUTANEOUS
  Administered 2017-12-21: 3 [IU] via SUBCUTANEOUS

## 2017-12-19 MED ORDER — INSULIN ASPART 100 UNIT/ML ~~LOC~~ SOLN
0.0000 [IU] | Freq: Three times a day (TID) | SUBCUTANEOUS | Status: DC
  Administered 2017-12-19: 2 [IU] via SUBCUTANEOUS
  Administered 2017-12-20: 3 [IU] via SUBCUTANEOUS
  Administered 2017-12-20: 1 [IU] via SUBCUTANEOUS
  Administered 2017-12-20 – 2017-12-21 (×2): 3 [IU] via SUBCUTANEOUS
  Administered 2017-12-21: 2 [IU] via SUBCUTANEOUS
  Administered 2017-12-21: 5 [IU] via SUBCUTANEOUS
  Administered 2017-12-22: 2 [IU] via SUBCUTANEOUS
  Administered 2017-12-22: 5 [IU] via SUBCUTANEOUS

## 2017-12-19 NOTE — H&P (Signed)
Internal Medicine Attending Admission Note Date: 12/19/2017  Patient name: [name] Medical record number: [id number] Date of birth: 05/01/1931 Age: 84 y.o. Gender: male  I saw and evaluated the patient. I reviewed the resident's note and I agree with the resident's findings and plan as documented in the resident's note.  Chief Complaint(s): Generalized weakness and right flank pain 6 weeks.  History - key components related to admission:  [name] is an 84 year old man with a history of coronary artery disease status post percutaneous coronary intervention and coronary artery bypass grafting, cerebrovascular accident, diabetes, and hypertension who presents with a 6 week history of generalized weakness and right flank pain. He states with regards to the weakness, he is having more and more difficulty getting himself out of the chair. Approximately 3 weeks ago he started developing nonspecific pain.  He was seen in the emergency department approximately 5 days ago where a urinalysis demonstrated leukocytosis and positive leukocyte esterase and nitrite. He was diagnosed with a urinary tract infection and placed on Keflex. Initially, his symptoms improved, but on the day of admission he developed shakes, chills, fevers measured to 100.4, nausea, and nonbilious emesis. He presented to the emergency department where he was found to be febrile to 102.2, tachypneic in the 30s, but not tachycardic or hypotensive.  A CT scan of the chest/abdomen/pelvis demonstrated some perinephric stranding bilaterally without obstruction and a distended gallbladder without calculi. An abdominal ultrasound was unremarkable and there was no sonographic Murphy sign. He was felt to have sepsis secondary to cystitis and pyelonephritis and admitted to the internal medicine teaching service. He was given IV ceftriaxone and intravenous fluids.  When seen on rounds the morning after admission he felt slightly improved. He had no  acute complaints and has had no further fevers. Urine culture subsequently grew Pseudomonas.  Physical Exam - key components related to admission:  Vitals:   12/19/17 0940 12/19/17 1436 12/19/17 1600 12/19/17 1728  BP: (!) 142/60 135/66  135/68  Pulse: 83 70    Resp: 18   18  Temp: 98.2 F (36.8 C) 100.3 F (37.9 C)  99 F (37.2 C)  TempSrc: Oral Oral  Oral  SpO2: 97% 96% 96% 98%  Weight:      Height:       Gen.: Well-developed, well-nourished, man lying comfortably in bed in no acute distress. Lungs: Clear to auscultation bilaterally without wheezes, rhonchi, rales. Heart: Regular rate and rhythm without murmurs, rubs, or gallops. Abdomen: Soft, nontender, without guarding or rebound. Mild purulence around the suprapubic catheter without erythema. Back: Without CVA tenderness bilaterally. Extremities: Swelling of the arm below the elbow secondary to tight wrapping of the olecranon bursa I&D site, no other edema.  Lab results:  Basic Metabolic Panel: Recent Labs    12/18/17 1106 12/19/17 0442  NA 137 139  K 3.9 4.2  CL 104 108  CO2 22 24  GLUCOSE 144* 151*  BUN 27* 26*  CREATININE 1.51* 1.92*  CALCIUM 9.0 8.0*   Liver Function Tests: Recent Labs    12/18/17 1351  AST 16  ALT 10  ALKPHOS 67  BILITOT 1.0  PROT 6.3*  ALBUMIN 3.6   Recent Labs    12/18/17 1351  LIPASE 27   CBC: Recent Labs    12/18/17 1106 12/19/17 0442  WBC 17.8* 29.3*  NEUTROABS 9.8*  --   HGB 10.7* 8.9*  HCT 33.9* 27.8*  MCV 91.9 92.4  PLT 274 228   CBG: Recent Labs  12/19/17 1749  GLUCAP 185*   Urinalysis:  Clear, yellow, specific gravity 1.014, pH 5.0, glucose 50, and the globe and small, protein greater than 300, nitrite positive, leukocytes small, red blood cells 0-5 per high-power field, white blood cells 21-50 per high-power field.  Misc. Labs:  Lactic acid 1.48 -> 2.02 -> 0.94 Urine culture: > 100,000 colonies per mL of Pseudomonas aeruginosa, sensitivities  pending Blood culture 2: No growth to date Left olecranon bursa I&D culture: No growth to date  Imaging results:   Abdominal ultrasound: Please review radiologist's interpretation and the imaging section.  Left elbow x-ray: Personally reviewed. Soft tissue swelling, no bony abnormalities.  CT of the chest/abdomen/pelvis: Personally reviewed. Right posterior pleural thickening, mild bronchiectasis, vascular calcifications, bilateral perinephric stranding.  Other results:  EKG: Personally reviewed. Normal sinus rhythm at 81 bpm, first-degree AV block, left axis deviation, significant Q waves in III and aVF, no LVH by voltage, delayed R wave progression, inferior T wave inversion in a strain pattern. No significant change from the previous ECG on 12/13/2017.  Assessment & Plan by Problem:  [name] is an 84 year old man with a history of coronary artery disease status post percutaneous coronary intervention and coronary artery bypass grafting, cerebrovascular accident, diabetes, and hypertension who presents with a 6 week history of generalized weakness and right flank pain. He was found to have bilateral perinephric stranding on CT scan and Pseudomonas aeruginosa in his urine. Therefore, his presentation is most consistent with a bilateral Pseudomonas aeruginosa pyelonephritis. Although this may have initially responded to Keflex because of the urinary concentration of the antibiotic it is unlikely to be curative given that Pseudomonas is almost assuredly resistant to Keflex. The same phenomenon can be said of the IV ceftriaxone and likely is why he was only slightly improved clinically this morning and he developed a worsening leukocytosis overnight.  1) Pseudomonas aeruginosa bilateral pyelonephritis and cystitis: Possible source was the original suprapubic catheter. This has since been changed. His antibiotics have also been modified to cefepime to cover the Pseudomonas aeruginosa  pyelonephritis and cystitis. We will follow-up with the sensitivities hopeful that an oral agent will be available in which to complete the antibiotic course as an outpatient. We will also monitor the white blood cell count to assure the leukocytosis is resolving with the anti-pseudomonal antibiotic initiation.  2) Acute kidney injury: This is despite the IV fluid hydration. The cause is unclear as he was not hypotensive. I suspect this may be related to the pyelonephritis and should improve with further antibiotic therapy. That said, we will follow the BUN and creatinine closely and avoid any nephrotoxic agents. Since he's had an ultrasound already, we know that it is not a postobstructive process that is playing a role.  3) Disposition: Pending his response to the antipseudomonal IV antibiotics for his bilateral pyelonephritis and the option of an oral agent to switch to once the sensitivities have returned. As he cares for his bed bound wife and mother-in-law, we will send him home as soon as it is deemed safe.

## 2017-12-19 NOTE — Progress Notes (Addendum)
ANTIBIOTIC CONSULT NOTE - INITIAL  Pharmacy Consult for cefepime Indication: UTI  Allergies  Allergen Reactions  . Other Other (See Comments)    No  BLOOD PRODUCTS - Pt is Jehovah's Witness  . Atorvastatin Other (See Comments)    Myalgias  . Crestor [Rosuvastatin] Other (See Comments)    Myalgias   . Pravastatin Other (See Comments)    Myalgias  . Simvastatin Other (See Comments)    Myalgias     Patient Measurements: Height: 6\' 1"  (185.4 cm) Weight: 218 lb 14.7 oz (99.3 kg) IBW/kg (Calculated) : 79.9   Vital Signs: Temp: 98.2 F (36.8 C) (08/04 0940) Temp Source: Oral (08/04 0940) BP: 142/60 (08/04 0940) Pulse Rate: 83 (08/04 0940) Intake/Output from previous day: 08/03 0701 - 08/04 0700 In: 698.1 [I.V.:698.1] Out: 375 [Urine:375] Intake/Output from this shift: No intake/output data recorded.  Labs: Recent Labs    12/18/17 1106 12/19/17 0442  WBC 17.8* 29.3*  HGB 10.7* 8.9*  PLT 274 228  CREATININE 1.51* 1.92*   Estimated Creatinine Clearance: 35.5 mL/min (A) (by C-G formula based on SCr of 1.92 mg/dL (H)). No results for input(s): VANCOTROUGH, VANCOPEAK, VANCORANDOM, GENTTROUGH, GENTPEAK, GENTRANDOM, TOBRATROUGH, TOBRAPEAK, TOBRARND, AMIKACINPEAK, AMIKACINTROU, AMIKACIN in the last 72 hours.   Microbiology: Recent Results (from the past 720 hour(s))  Urine culture     Status: Abnormal (Preliminary result)   Collection Time: 12/18/17 12:38 PM  Result Value Ref Range Status   Specimen Description URINE, RANDOM  Final   Special Requests   Final    NONE Performed at [facility], [address], [place], [place] [address]    Culture >=100,000 COLONIES/mL PSEUDOMONAS AERUGINOSA (A)  Final   Report Status PENDING  Incomplete  Wound or Superficial Culture     Status: None (Preliminary result)   Collection Time: 12/18/17  5:05 PM  Result Value Ref Range Status   Specimen Description WOUND  Final   Special Requests NONE  Final   Gram Stain   Final    FEW WBC PRESENT, PREDOMINANTLY PMN NO ORGANISMS SEEN Performed at [facility], [place] [address] [place], [place] [address]    Culture PENDING  Incomplete   Report Status PENDING  Incomplete    Medical History: Past Medical History:  Diagnosis Date  . Abdominal aortic aneurysm ([place])    a. Aortic duplex 06/2014: mild aneurysmal dilatation of proximal abdominal aorta measuring 3.4x3.4cm. No sig change from 2012. F/u due 06/2016;   . AKI (acute kidney injury) ([place]) 03/28/2016  . Arthritis    "right knee; never bothered me" (03/26/2016)  . 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 CAD in native artery    a. NSTEMI 11/2010 - CABG x2(LIMA to LAD, SVG to PDA). b. NEG Lexi MV 10/24/13, EF 53%, no perfusion abnormality, septal and apical HK noted. c. NSTEMI 05/2014 - s/p DES to SVG-RPDA 06/18/14 (Xience Alpine DES 3.0 x 18 mm -3.35 mm), EF 60-65; d. 03/2016 STEMI/PCI: LM nl, ost LAD 70%, mLCx 50%, pRCA 95% - mRCA 60%, dRPDA 70%, LIMA->LAD ok, o-p SVG->RPDA 100% (3.0x16 Promus DES overlapping prior stent).  . Diastolic dysfunction    a. 03/2016 Echo: EF 60-65%, no rwma, Gr1 DD, triv AI, Ao root 63mm, Asc Ao 52mm, triv MR.  . Dizziness    a. Carotid duplex 03/2014: mild fibrous plaque, no significant stenosis.  . Foley catheter in place    "been wearing it for a couple months  now" (03/26/2016)  . HTN (hypertension)   . Hyperlipidemia LDL goal <70   . Non-STEMI (non-ST elevated myocardial infarction) ([place]) 11/2010   1. Ostial LAD 70% (to close to [facility] for PCI), subtotal occlusion of the RCA  . Postoperative atrial fibrillation ([place]) 11/2010   Post CABG, no sign recurrence  . Refusal of blood transfusions as patient is Jehovah's Witness   . 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 Servando Snare)  . Sleep apnea    Not on CPAP. (03/26/2016)  . ST elevation myocardial infarction (STEMI) of inferior wall ([place]) 04/05/2016   Occluded in-stent restenosis/thrombosis  of SVG-RCA --> treated with overlapping Promus DES 3.0 mm 16 mm postdilated 3.0 mm).  . Stroke [facility]) 2014; 01/2015   a. 2014 with mild right hand weakness, nonhemorrhagic per pt.;; b. - PTA-Stent L ICA 95%   . Type II diabetes mellitus [facility])      Assessment: 84 yo male admitted with sepsis symptoms from a possible UTI. Febrile, tachypneic. L.A. 2.02.   Seen in ED 5 days ago and treated for a UTI with keflex without improvement. Purulent drainage from his suprapubic catheter noted in H&P. Patient also has an AKI (SCr 1.51>>1.92)  Urine culture now growing Pseudomonas Aeruginosa (sensitivies pending)  Plan:  Cefepime 1gm IV q24h Monitor clinical course, urine sensitivities, renal function, and LOT  Gina Costilla A. Levada Dy, PharmD, [place] Pager: [phone] Please utilize Amion for appropriate phone number to reach the unit pharmacist ([place])   12/19/2017,1:56 PM

## 2017-12-19 NOTE — Progress Notes (Signed)
Paged for patient's nephew reports slurred speech and lack of movement in left lower face. Went to go evaluate at bedside. Patient was resting comfortably when we got there. The nephew stated that it happened about 10 mintues before we got there and he called the nurse to come and check on him. When the nurse got there he returned back to baseline and was speaking normally. He denied any dizziness, pain, numbness, or weakness. He has had a prior CVA which caused right hand weakness.   On exam he was speaking normally with no slurring or word finding difficulty. Neuro exam: CN 2-12 grossly intact, no facial droop, EOMI, 5/5 strength in upper and lower extremities except for his right hand which was unable to close. Sensation intact throughout. At this time he does not appear to being having any symptoms and the nurse did not report and neurologic findings. We will monitor for any similar events.   Asencion Noble, M.D. PGY1 Pager [phone] 12/19/2017 6:39 PM

## 2017-12-19 NOTE — Progress Notes (Signed)
Patient is 96 on Room air.

## 2017-12-19 NOTE — Progress Notes (Deleted)
Patient discharged to home. After visit Summary reviewed. Patient capable of reverbalizing medications and follow up visits. No signs and symptoms of distress noted. Patient educated to return to the ED in the case of an emergency. Dillon Bjork RN

## 2017-12-19 NOTE — Progress Notes (Signed)
Patient's nephew states that he was talking to patient and he starts to slur his speech. Assessment of the patient showed the following vitals sign 95 on 2 litres and 68 pulse, temp100.3 . MD was notified and came and assess the patient .Will continue to monitor patient.

## 2017-12-19 NOTE — Progress Notes (Signed)
   Subjective:  Patient was doing well this morning, states that he feels a lot better. No acute events overnight. Denies any abdominal pain today, no fevers, chills, headaches, or shortness of breath.   Objective:  Vital signs in last 24 hours: Vitals:   12/18/17 2030 12/18/17 2128 12/18/17 2229 12/19/17 0447  BP: (!) 150/65 (!) 152/84  130/63  Pulse: 79 92  82  Resp:  (!) 21  20  Temp:  98.7 F (37.1 C)  100.2 F (37.9 C)  TempSrc:  Oral  Oral  SpO2: 98% 92%  94%  Weight:  218 lb 14.7 oz (99.3 kg) 218 lb 14.7 oz (99.3 kg)   Height:   6\' 1"  (1.854 m)    General: Well appearing, NAD, resting comfortably HEENT: Normocephalic, atraumatic, trachea midline Cardiac: RRR, no murmurs, rubs or gallops Pulmonary: Lungs CTA bilaterally, non-labored breathing, no wheezing, rhonchi or rales Abdomen: Soft, non-tender, no guarding, +BS, no CVA tenderness, suprapubic catheter in place with some purulent drainage around site. Extremity: Normal extremities, no LE edema Neuro:  PERRLA, moves all extremities Psychiatry: Normal mood and affect   Assessment/Plan: This is an 84 year old male with a past medical history of hypertension, diabetes mellitus, coronary artery disease(status post PCI with DES in SVG to PDA), CVA presenting with generalized weakness, fatigue, and abdominal pain.  SIRS likely 2/2 pyelonephritis vs suprapubic catheter infection: Patient presented with generalized weakness, fatigue, abdominal pain.  He is found to be febrile to 102.2, tachypneic, hypertensive.  Labs showed a lactic acid of 2.02 and white blood cell count 17.2.  Sepsis protocol was initiated and patient was given Keflex and 10 mL normal saline. Lactic acid decreased to 0.94. Orthopedics did an I&D of patient's left olecranon bursa but they did not think that this is a source of his infection.  He was recently seen in the ED 5 days prior to admission and found to have a UTI.  Patient was prescribed Keflex however he  did not improve.  Patient does have a suprapubic catheter in place that was draining well and fluid. There is a concern of pyelonephritis given his recent UTI, symptoms of abdominal pain, and a CT abdomen that showed perinephric stranding, he did not have CVA tenderness on our evaluation but the EDP did elicit this. Another possible source is the suprapubic catheter due to the purulent drainage. Patient has been afebrile and has been continued on ceftriaxone for possible pyelonephritis versus suprapubic catheter infection. His CBC today showed an increased WBC to 29.3.  -Bursa culture: No organisms seen -Monitoring fever  -Blood cultures pending -Urine culture showed >100,000 pseudomonas  -Switch from ceftriaxone to cefepime 1 gm q24 hours  Diabetes mellitus:  -Holding home glimepiride and metformin -SSI-Sensitive -CBGs with meals and at bedtime  Essential hypertension: Patient was normotensive overnight. -Continue home labetalol, amlodipine, and hydralazine.  CAD:  Patient is on losartan-hydrochlorothiazide and hydralazine at home. -Hold home medications  AKI: BUN 27, Cr 1.51 on admission. Patient received 3L NS while in ED.  -Today: BUN 26, Cr 1.96  FEN: No fluids, replete lytes prn, heart healthy diet VTE ppx: Lovenox  Code Status: FULL  Dispo: Anticipated discharge in approximately 2 day(s).   Asencion Noble, MD 12/19/2017, 6:28 AM Pager: [phone]

## 2017-12-19 NOTE — Progress Notes (Signed)
Internal Medicine Attending  Date: 12/19/2017  Patient name: [name] Medical record number: [id number] Date of birth: 09-17-1930 Age: 84 y.o. Gender: male  I saw and evaluated the patient. I reviewed the resident's note by Dr. Sherry Ruffing and I agree with the resident's findings and plans as documented in her progress note.  Please see my H&P dated 12/19/2017 for specifics of my evaluation, assessment, and plan from earlier in the day.

## 2017-12-20 DIAGNOSIS — G5691 Unspecified mononeuropathy of right upper limb: Secondary | ICD-10-CM

## 2017-12-20 DIAGNOSIS — Z955 Presence of coronary angioplasty implant and graft: Secondary | ICD-10-CM

## 2017-12-20 DIAGNOSIS — M7020 Olecranon bursitis, unspecified elbow: Secondary | ICD-10-CM

## 2017-12-20 DIAGNOSIS — N183 Chronic kidney disease, stage 3 unspecified: Secondary | ICD-10-CM

## 2017-12-20 DIAGNOSIS — Z8673 Personal history of transient ischemic attack (TIA), and cerebral infarction without residual deficits: Secondary | ICD-10-CM

## 2017-12-20 DIAGNOSIS — Z9359 Other cystostomy status: Secondary | ICD-10-CM

## 2017-12-20 DIAGNOSIS — Z87448 Personal history of other diseases of urinary system: Secondary | ICD-10-CM

## 2017-12-20 DIAGNOSIS — M7022 Olecranon bursitis, left elbow: Secondary | ICD-10-CM

## 2017-12-20 LAB — GLUCOSE, CAPILLARY
GLUCOSE-CAPILLARY: 142 mg/dL — AB (ref 70–99)
GLUCOSE-CAPILLARY: 275 mg/dL — AB (ref 70–99)
GLUCOSE-CAPILLARY: 234 mg/dL — AB (ref 70–99)
Glucose-Capillary: 224 mg/dL — ABNORMAL HIGH (ref 70–99)

## 2017-12-20 LAB — CBC
HCT: 29.5 % — ABNORMAL LOW (ref 39.0–52.0)
HEMOGLOBIN: 9.4 g/dL — AB (ref 13.0–17.0)
MCH: 29.5 pg (ref 26.0–34.0)
MCHC: 31.9 g/dL (ref 30.0–36.0)
MCV: 92.5 fL (ref 78.0–100.0)
Platelets: 207 10*3/uL (ref 150–400)
RBC: 3.19 MIL/uL — ABNORMAL LOW (ref 4.22–5.81)
RDW: 16.8 % — ABNORMAL HIGH (ref 11.5–15.5)
WBC: 17.1 10*3/uL — ABNORMAL HIGH (ref 4.0–10.5)

## 2017-12-20 LAB — BASIC METABOLIC PANEL
Anion gap: 8 (ref 5–15)
BUN: 36 mg/dL — ABNORMAL HIGH (ref 8–23)
CHLORIDE: 106 mmol/L (ref 98–111)
CO2: 22 mmol/L (ref 22–32)
Calcium: 8.1 mg/dL — ABNORMAL LOW (ref 8.9–10.3)
Creatinine, Ser: 1.78 mg/dL — ABNORMAL HIGH (ref 0.61–1.24)
GFR calc non Af Amer: 33 mL/min — ABNORMAL LOW (ref >60)
GFR, EST AFRICAN AMERICAN: 39 mL/min — AB (ref >60)
Glucose, Bld: 139 mg/dL — ABNORMAL HIGH (ref 70–99)
POTASSIUM: 4 mmol/L (ref 3.5–5.1)
SODIUM: 136 mmol/L (ref 135–145)

## 2017-12-20 LAB — TROPONIN I
TROPONIN I: 0.05 ng/mL — AB (ref <0.03)
TROPONIN I: 0.04 ng/mL — AB (ref <0.03)
TROPONIN I: 0.04 ng/mL — AB (ref <0.03)

## 2017-12-20 MED ORDER — SODIUM CHLORIDE 0.9 % IV SOLN: INTRAVENOUS | Status: DC | PRN

## 2017-12-20 MED ORDER — DOCUSATE SODIUM 100 MG PO CAPS
200.0000 mg | ORAL_CAPSULE | Freq: Every day | ORAL | Status: DC
  Administered 2017-12-20 – 2017-12-22 (×3): 200 mg via ORAL
  Filled 2017-12-20 (×2): qty 2

## 2017-12-20 MED ORDER — CEFEPIME HCL 2 G IJ SOLR
2.0000 g | INTRAMUSCULAR | Status: DC
  Administered 2017-12-20: 2 g via INTRAVENOUS
  Filled 2017-12-20 (×2): qty 2

## 2017-12-20 MED ORDER — NITROGLYCERIN 0.4 MG SL SUBL
0.4000 mg | SUBLINGUAL_TABLET | SUBLINGUAL | Status: DC | PRN
  Administered 2017-12-20: 0.4 mg via SUBLINGUAL
  Filled 2017-12-20: qty 1

## 2017-12-20 NOTE — Progress Notes (Signed)
   Subjective: Patient reported that he slept well. He did report this episode of chest pain that started about 30 minutes before we got there, it started when he was sitting in his bed, it was located on his left side and has been constant. He has had a history of CAD s/p CABG and stent and he states that he does not remember if it feels like that. He has not had a bowel movement in 3 days.   Objective:  Vital signs in last 24 hours: Vitals:   12/19/17 1600 12/19/17 1728 12/19/17 2110 12/20/17 0446  BP:  135/68 136/60 (!) 146/61  Pulse:   73 70  Resp:  18 20 20   Temp:  99 F (37.2 C) 98.7 F (37.1 C) 99.9 F (37.7 C)  TempSrc:  Oral  Oral  SpO2: 96% 98% 91% 91%  Weight:      Height:       General: Well developed, well appearing, NAD HEENT: Normocephalic, atraumatic Cardiac: RRR, no murmurs rubs or gallops, 2+ pulses throughout Pulmonary: Lungs CTA, no wheezes, rhonchi or rales Abdomen: Soft, non-tender, + bowel sounds, no gaurding, suprapubic catheter with no erythema, tenderness, or drainage around the outside Extremity: No LE edema, bandage over olecranon bursa site Neuro: Awake, alert and oriented x3, PERRLA, moves all extremities, chronic right finger paralysis Psychiatry: Normal mood and affect   Assessment/Plan: This is an 84 year old male with a past medical history of hypertension, diabetes mellitus, coronary artery disease(status post PCI with DESin SVG to PDA),CVA presenting with generalized weakness, fatigue, and abdominal pain.  SIRs 2/2 pyelonephritis vs suprapubic infection: Patient presented with generalized weakness, generalized soreness and right flank pain. He was treated for UTI 5 days prior to admission with keflex. He continued to have these symptoms and returned to the ER.On admission he was febrile, tachypnea and hypertensive. He had some drainage around the suprapubic catheter. EDP was able to elicit CVA tenderness.  Lactic acid was 2/02 and WBC of 17.2.  Patient was given keflex in the ED.  He was found to have bilateral perinephric stranding on CT scan. Yesterday his WBC was 29.3. Urine culture showed >100,000 pseudomonas so ceftriaxone was switched to to cefepime for pseudomonal coverage. -Patient has been afebrile overnight.  Continue to monitor.  -On IV cefepime 1 g q24 hours -Blood culture pending  Chest pain: Patient reports an episode of chest pain that started today. Stated that  Possible unstable angina which may be exacerbated by his current infection. He does have a history of CAD (s/p CABG and stents) in the past. He reports some SOB with the chest pain. Heart score is 5 which is a moderate risk. EKG was taken which showed rate 78 bpm, normal sinus rhythm, q wave in inferior leads suggestive of previous infarction, no acute ST changes .  -Repeat EKG in 2 hours -Troponin 0.05, repeat EKG x2  Diabetes mellitus:  -Holding home glimepiride and metformin -SSI-Sensitive -CBGs with meals and at bedtime  Essential hypertension: Patient was normotensive overnight. -Continue home labetalol, amlodipine, and hydralazine.  CAD: Patient is on losartan-hydrochlorothiazide and hydralazine at home. -Holdhome medications  AKI: BUN 27, Cr 1.51 on admission. Patient received 3L NS while in ED.  -Today: BUN 26, Cr 1.96  FEN: No fluids, replete lytes prn, heart healthy diet VTE ppx: Lovenox  Code Status: FULL  Dispo: Anticipated discharge in approximately 2 day(s).   Asencion Noble, MD 12/20/2017, 6:38 AM Pager: [phone]

## 2017-12-20 NOTE — Progress Notes (Signed)
ANTIBIOTIC CONSULT NOTE  Pharmacy Consult for cefepime Indication: UTI  Patient Measurements: Height: 6\' 1"  (185.4 cm) Weight: 218 lb 14.7 oz (99.3 kg) IBW/kg (Calculated) : 79.9  Vital Signs: Temp: 99.1 F (37.3 C) (08/05 0930) Temp Source: Oral (08/05 0930) BP: 131/60 (08/05 0930) Pulse Rate: 63 (08/05 0930) Intake/Output from previous day: 08/04 0701 - 08/05 0700 In: 141.1 [IV Piggyback:141.1] Out: 1000 [Urine:1000] Intake/Output from this shift: Total I/O In: 250 [P.O.:250] Out: -   Labs: Recent Labs    12/18/17 1106 12/19/17 0442 12/20/17 0740  WBC 17.8* 29.3* 17.1*  HGB 10.7* 8.9* 9.4*  PLT 274 228 207  CREATININE 1.51* 1.92* 1.78*   Estimated Creatinine Clearance: 38.3 mL/min (A) (by C-G formula based on SCr of 1.78 mg/dL (H)). No results for input(s): VANCOTROUGH, VANCOPEAK, VANCORANDOM, GENTTROUGH, GENTPEAK, GENTRANDOM, TOBRATROUGH, TOBRAPEAK, TOBRARND, AMIKACINPEAK, AMIKACINTROU, AMIKACIN in the last 72 hours.   Microbiology: Recent Results (from the past 720 hour(s))  Urine culture     Status: Abnormal (Preliminary result)   Collection Time: 12/18/17 12:38 PM  Result Value Ref Range Status   Specimen Description URINE, RANDOM  Final   Special Requests NONE  Final   Culture (A)  Final    >=100,000 COLONIES/mL PSEUDOMONAS AERUGINOSA REPEATING SUSCEPTIBILITIES Performed at [facility], [address] [place], [place] [address]    Report Status PENDING  Incomplete  Culture, blood (routine x 2)     Status: None (Preliminary result)   Collection Time: 12/18/17  1:51 PM  Result Value Ref Range Status   Specimen Description BLOOD LEFT ANTECUBITAL  Final   Special Requests   Final    BOTTLES DRAWN AEROBIC AND ANAEROBIC Blood Culture results may not be optimal due to an inadequate volume of blood received in culture bottles   Culture   Final    NO GROWTH < 24 HOURS Performed at [facility], [place] [address], [place], [place] [address]    Report Status PENDING  Incomplete  Culture, blood (routine x 2)     Status: None (Preliminary result)   Collection Time: 12/18/17  1:51 PM  Result Value Ref Range Status   Specimen Description BLOOD RIGHT ANTECUBITAL  Final   Special Requests   Final    BOTTLES DRAWN AEROBIC AND ANAEROBIC Blood Culture adequate volume   Culture   Final    NO GROWTH < 24 HOURS Performed at [facility], [place] [address], [place], [place] [address]    Report Status PENDING  Incomplete  Wound or Superficial Culture     Status: None (Preliminary result)   Collection Time: 12/18/17  5:05 PM  Result Value Ref Range Status   Specimen Description WOUND  Final   Special Requests NONE  Final   Gram Stain   Final    FEW WBC PRESENT, PREDOMINANTLY PMN NO ORGANISMS SEEN    Culture   Final    NO GROWTH 2 DAYS Performed at [facility], [place] [address] [place], [place] [address]    Report Status PENDING  Incomplete    Assessment: 84 yo male admitted with sepsis symptoms from a possible UTI. Pharmacy consulted for Cefepime dosing.   Renal function improving with SCr 1.78 << 1.92, CrCl~35-40 ml/min. Will adjust Cefepime dose today.   Plan - Adjust Cefepime to 2g IV every 24 hours - Will continue to follow renal function, culture results, LOT, and antibiotic de-escalation plans   Thank you for allowing pharmacy to be  a part of this patient's care.  Alycia Rossetti, PharmD, BCPS Clinical Pharmacist Pager: [phone] Clinical phone for 12/20/2017 from 7a-3:30p: [phone] If after 3:30p, please call main pharmacy at: [phone] Please check AMION for all [place] numbers 12/20/2017 2:18 PM

## 2017-12-20 NOTE — Progress Notes (Signed)
Patient's family states patient "looks much worse than when he came in". They said they were concerned about his breathing. Lung sounds clear, except for in the basses where they were diminished. incentive spirometer obtained and given to patient.   Family Medicine team made aware. No new orders at this time. Will continue to assess patient.

## 2017-12-20 NOTE — Progress Notes (Addendum)
Orthopedics note:   The patient has no complaints of left elbow pain at this time.  It appears as infectious etiologies related to pyelonephritis.   Left elbow: No erythema or warmth about the elbow.  He is neurovascular intact throughout with no pain on passive or active range of motion.  I did pull the iodoform packing from the olecranon incision site.  Scant serous drainage did come out.  Assessment/plan:  Left elbow aseptic olecranon bursitis.   - Would recommend continuation of twice Daily Dial soap soaks to the elbow. - Apply dry dressings to elbow betwee soaks. - Weightbearing as tolerated to left elbow and upper with no limitations. - No follow-up needed.  We will sign off.

## 2017-12-20 NOTE — Progress Notes (Signed)
During shift assessment patient states he is having new onset chest pain 6/10. MD paged, EKG obtained, awaiting response.   Update: New orders placed

## 2017-12-20 NOTE — Progress Notes (Addendum)
CRITICAL VALUE STICKER  CRITICAL VALUE: Troponin 0.05  RECEIVER (on-site recipient of call): Julieanne Cotton, RN   [place] NOTIFIED: 8/5 0914  MESSENGER (representative from lab): CLARK,S   MD NOTIFIED: Attending Resident Paged  TIME OF NOTIFICATION: [phone]  RESPONSE:  Awaiting call back   Update: attending called back and verbally made aware.

## 2017-12-20 NOTE — Progress Notes (Signed)
Medicine attending: I examined this patient today together with resident physician Dr Lonia Skinner and I concur with her evaluation and management plan which we discussed together.  84 year old man with known cardiac disease with coronary bypass surgery in 2012 and subsequent coronary stenting in 2016 and 2017.  He has had no recent ischemic cardiac symptoms prior to this hospitalization.  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  He is being followed by urology.  He presented on the day of the current admission with nausea, vomiting, fever 102.2 degrees, and chills despite treatment for presumed urinary tract infection with oral antibiotics in the previous 5 days.  Initial white count 18,000, repeat 29,000.  CT scan showed bilateral perinephric stranding.  Findings consistent with bilateral pyelonephritis.  He was started on ceftriaxone.  Urine cultures now growing Pseudomonas and antibiotics changed to cefipime.  Blood cultures negative so far. This morning he developed ischemic quality retrosternal chest discomfort which started to subside on its own when we were called to evaluate him.  No acute change in his vital signs.  Temp trend has been down and temp was 99.9.  Cardiogram shows no acute ischemic changes.  However we will check troponin and give him a trial of sublingual nitroglycerin.  Continue to monitor his status.

## 2017-12-20 NOTE — Progress Notes (Signed)
Patient complains of feeling extremely weak due to laying in bed for the last four days. I educated patient on the importance of getting OOB daily and ambulating as much as possible.   Patient was a heavy +1 assist to the chair. He was able to tolerate sitting in the chair 45 minutes before getting back to bed.   Patient moved from chair to bed much easier.

## 2017-12-21 ENCOUNTER — Inpatient Hospital Stay (HOSPITAL_COMMUNITY): Payer: Medicare Other

## 2017-12-21 DIAGNOSIS — T83511D Infection and inflammatory reaction due to indwelling urethral catheter, subsequent encounter: Secondary | ICD-10-CM

## 2017-12-21 DIAGNOSIS — R748 Abnormal levels of other serum enzymes: Secondary | ICD-10-CM

## 2017-12-21 DIAGNOSIS — I10 Essential (primary) hypertension: Secondary | ICD-10-CM

## 2017-12-21 DIAGNOSIS — N390 Urinary tract infection, site not specified: Secondary | ICD-10-CM

## 2017-12-21 DIAGNOSIS — E7800 Pure hypercholesterolemia, unspecified: Secondary | ICD-10-CM

## 2017-12-21 DIAGNOSIS — N4889 Other specified disorders of penis: Secondary | ICD-10-CM

## 2017-12-21 DIAGNOSIS — R778 Other specified abnormalities of plasma proteins: Secondary | ICD-10-CM

## 2017-12-21 DIAGNOSIS — R7989 Other specified abnormal findings of blood chemistry: Secondary | ICD-10-CM

## 2017-12-21 DIAGNOSIS — Z7982 Long term (current) use of aspirin: Secondary | ICD-10-CM

## 2017-12-21 DIAGNOSIS — N433 Hydrocele, unspecified: Secondary | ICD-10-CM

## 2017-12-21 DIAGNOSIS — I2584 Coronary atherosclerosis due to calcified coronary lesion: Secondary | ICD-10-CM

## 2017-12-21 DIAGNOSIS — N5089 Other specified disorders of the male genital organs: Secondary | ICD-10-CM

## 2017-12-21 DIAGNOSIS — I2510 Atherosclerotic heart disease of native coronary artery without angina pectoris: Secondary | ICD-10-CM

## 2017-12-21 LAB — CBC
HEMATOCRIT: 27.2 % — AB (ref 39.0–52.0)
HEMOGLOBIN: 8.9 g/dL — AB (ref 13.0–17.0)
MCH: 29.6 pg (ref 26.0–34.0)
MCHC: 32.7 g/dL (ref 30.0–36.0)
MCV: 90.4 fL (ref 78.0–100.0)
Platelets: 224 10*3/uL (ref 150–400)
RBC: 3.01 MIL/uL — AB (ref 4.22–5.81)
RDW: 16.4 % — AB (ref 11.5–15.5)
WBC: 10.7 10*3/uL — AB (ref 4.0–10.5)

## 2017-12-21 LAB — URINE CULTURE

## 2017-12-21 LAB — BASIC METABOLIC PANEL
ANION GAP: 10 (ref 5–15)
BUN: 36 mg/dL — ABNORMAL HIGH (ref 8–23)
CALCIUM: 8.1 mg/dL — AB (ref 8.9–10.3)
CHLORIDE: 105 mmol/L (ref 98–111)
CO2: 21 mmol/L — ABNORMAL LOW (ref 22–32)
Creatinine, Ser: 1.59 mg/dL — ABNORMAL HIGH (ref 0.61–1.24)
GFR calc non Af Amer: 38 mL/min — ABNORMAL LOW (ref >60)
GFR, EST AFRICAN AMERICAN: 44 mL/min — AB (ref >60)
Glucose, Bld: 278 mg/dL — ABNORMAL HIGH (ref 70–99)
Potassium: 3.8 mmol/L (ref 3.5–5.1)
SODIUM: 136 mmol/L (ref 135–145)

## 2017-12-21 LAB — AEROBIC CULTURE  (SUPERFICIAL SPECIMEN): CULTURE: NO GROWTH

## 2017-12-21 LAB — AEROBIC CULTURE W GRAM STAIN (SUPERFICIAL SPECIMEN)

## 2017-12-21 LAB — GLUCOSE, CAPILLARY
GLUCOSE-CAPILLARY: 263 mg/dL — AB (ref 70–99)
GLUCOSE-CAPILLARY: 235 mg/dL — AB (ref 70–99)
GLUCOSE-CAPILLARY: 287 mg/dL — AB (ref 70–99)
Glucose-Capillary: 199 mg/dL — ABNORMAL HIGH (ref 70–99)

## 2017-12-21 MED ORDER — CARVEDILOL 12.5 MG PO TABS
12.5000 mg | ORAL_TABLET | Freq: Two times a day (BID) | ORAL | Status: DC
Start: 2017-12-21 — End: 2017-12-22
  Administered 2017-12-21 – 2017-12-22 (×2): 12.5 mg via ORAL
  Filled 2017-12-21 (×2): qty 1

## 2017-12-21 MED ORDER — HYDRALAZINE HCL 50 MG PO TABS
100.0000 mg | ORAL_TABLET | Freq: Three times a day (TID) | ORAL | Status: DC
Start: 2017-12-21 — End: 2017-12-22
  Administered 2017-12-21 – 2017-12-22 (×2): 100 mg via ORAL
  Filled 2017-12-21 (×2): qty 2

## 2017-12-21 MED ORDER — ISOSORBIDE MONONITRATE ER 60 MG PO TB24
60.0000 mg | ORAL_TABLET | Freq: Every evening | ORAL | Status: DC
  Administered 2017-12-21: 60 mg via ORAL
  Filled 2017-12-21: qty 1

## 2017-12-21 MED ORDER — CIPROFLOXACIN HCL 500 MG PO TABS
500.0000 mg | ORAL_TABLET | Freq: Two times a day (BID) | ORAL | Status: DC
  Administered 2017-12-21 – 2017-12-22 (×3): 500 mg via ORAL
  Filled 2017-12-21 (×3): qty 1

## 2017-12-21 MED ORDER — POLYETHYLENE GLYCOL 3350 17 G PO PACK
17.0000 g | PACK | Freq: Once | ORAL | Status: AC
  Administered 2017-12-21: 17 g via ORAL
  Filled 2017-12-21: qty 1

## 2017-12-21 NOTE — Care Management Important Message (Signed)
Important Message  Patient Details  Name: [name] MRN: [id number] Date of Birth: 1931-01-13   Medicare Important Message Given:  Yes    Orbie Pyo 12/21/2017, 4:35 PM

## 2017-12-21 NOTE — Consult Note (Signed)
Admit date: 12/18/2017 Referring Physician  Dr. Eppie Gibson Primary Physician  Internal Medicine Teaching Service Primary Cardiologist  Dr. Glenetta Hew Reason for Consultation  Chest pain  HPI: [name] is a 84 y.o. male who is being seen today for the evaluation of chest pain at the request of Dr. Eppie Gibson.  This is an 84 year old male with a history of hypertension, diabetes mellitus type 2, history of CVA with 90% left MCA stenosis followed by Dr. Kathee Delton and hyperlipidemia.  He also has a history of diastolic dysfunction with normal LV function by echo 03/2016, dilated aortic root at 46 mm and 45 mm a sending aorta,, postoperative A. fib with no recurrence.    He has an extensive cardiac hx with ASCAD s/p remote NSTEMI 2012 with CABG (LIMA to LAD and SVG to PDA)  and then NSTEMI 2016 with PCI/DES to SVG to PDA).  He had a STEMI 03/2016 with cath showing normal left main, 70% ostial LAD, 50% mid left circumflex, 95% prox RCA and 60% mid RCA, 70% distal RPDA with patent LIMA to the LAD and occluded SVG to the RPDA PDA.  Status post DES to the SVG to RPDA.    He had recurrent chest pain in April 2019 underwent cardiac cath showing severe three-vessel coronary disease with newly occluded mid RCA, 70% proximal LAD, widely patent LIMA to the LAD and patent SVG to the RPDA with minimal in-stent restenosis in the SVG.  He had an 80% small OM 2, 60% mid left circumflex and 50% diagonal 1.  Medical therapy was increased.  He was just seen on August 3 by Dr. Ellyn Hack and was doing well.  He presented to [facility] on 12/18/2017 with a 6-week history of generalized weakness and right flank pain.  He was diagnosed 5 days prior to admission and with a UTI and started on antibiotics.  He then started having fever chills and Reiger's as well as nausea.  On admission he was febrile to 102.2 and was found on CT scan to have perinephric stranding bilaterally without obstruction and a distended gallbladder without calculi.   He was felt to have sepsis secondary to cystitis and acute pyelonephritis.  Yesterday morning he developed retrosternal chest pain without any radiation to his arms or neck.  Vital signs remained stable and EKG showed no ischemic ST changes.  He was given a sublingual nitroglycerin which resolved his pain and he has not had any further chest pain.  Troponins were cycled and were minimally elevated with flat trend at 0.04> 0.04> 0.05.  Cardiology now asked to consult for further evaluation.  Reviewing back at Dr. Allison Quarry last note apparently the patient was in the emergency room on 12/13/2017 with acute cystitis and did note having some mild chest pain but it resolved.  Medical therapy was recommended at that time.  It was felt that his poorly controlled blood pressure may be attributing to his chest pain.  His hydralazine was increased to 100 mg 3 times daily.     PMH:   Past Medical History:  Diagnosis Date  . Abdominal aortic aneurysm ([place])    a. Aortic duplex 06/2014: mild aneurysmal dilatation of proximal abdominal aorta measuring 3.4x3.4cm. No sig change from 2012. F/u due 06/2016;   . AKI (acute kidney injury) ([place]) 03/28/2016  . Arthritis    "right knee; never bothered me" (03/26/2016)  . 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 CAD in native artery  a. NSTEMI 11/2010 - CABG x2(LIMA to LAD, SVG to PDA). b. NEG Lexi MV 10/24/13, EF 53%, no perfusion abnormality, septal and apical HK noted. c. NSTEMI 05/2014 - s/p DES to SVG-RPDA 06/18/14 (Xience Alpine DES 3.0 x 18 mm -3.35 mm), EF 60-65; d. 03/2016 STEMI/PCI: LM nl, ost LAD 70%, mLCx 50%, pRCA 95% - mRCA 60%, dRPDA 70%, LIMA->LAD ok, o-p SVG->RPDA 100% (3.0x16 Promus DES overlapping prior stent).  . Diastolic dysfunction    a. 03/2016 Echo: EF 60-65%, no rwma, Gr1 DD, triv AI, Ao root 70mm, Asc Ao 4mm, triv MR.  . Dizziness    a. Carotid duplex 03/2014: mild fibrous plaque, no significant stenosis.  .  Foley catheter in place    "been wearing it for a couple months now" (03/26/2016)  . HTN (hypertension)   . Hyperlipidemia LDL goal <70   . Non-STEMI (non-ST elevated myocardial infarction) ([place]) 11/2010   1. Ostial LAD 70% (to close to [facility] for PCI), subtotal occlusion of the RCA  . Postoperative atrial fibrillation ([place]) 11/2010   Post CABG, no sign recurrence  . Refusal of blood transfusions as patient is Jehov[employer]'s Witness   . 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 Servando Snare)  . Sleep apnea    Not on CPAP. (03/26/2016)  . ST elevation myocardial infarction (STEMI) of inferior wall ([place]) 04/05/2016   Occluded in-stent restenosis/thrombosis of SVG-RCA --> treated with overlapping Promus DES 3.0 mm 16 mm postdilated 3.0 mm).  . Stroke [facility]) 2014; 01/2015   a. 2014 with mild right hand weakness, nonhemorrhagic per pt.;; b. - PTA-Stent L ICA 95%   . Type II diabetes mellitus ([facility])      PSH:   Past Surgical History:  Procedure Laterality Date  . APPENDECTOMY    . CARDIAC CATHETERIZATION  12/11/2010   Dr. Chase Picket - subsequent cath - normal LV systolic function, no renal artery stenosis, severe 2-vessel disease with subtotaled RCA prox and distal 60% lesion and complex 70% area of narrowing of ostium of LAD  . CARDIAC CATHETERIZATION N/A 03/26/2016   Procedure: Left Heart Cath and Coronary Angiography;  Surgeon: Nelva Bush, MD;  Location: [place] CV LAB;  Service: Cardiovascular;  Laterality: N/A;  . CARDIAC CATHETERIZATION N/A 03/26/2016   Procedure: Coronary Stent Intervention;  Surgeon: Nelva Bush, MD;  Location: [place] CV LAB;  Service: Cardiovascular: 100% In-stent thrombosis of pros SVG-RCA (Xience DES) --> treated with PromusDES 3.0 x 18 (3.3 mm)  . CARDIAC CATHETERIZATION N/A 03/26/2016   Procedure: Bypass Graft Angiography;  Surgeon: Nelva Bush, MD;  Location: [place] CV LAB;  Service: Cardiovascular;  Laterality: N/A;  . CORONARY ANGIOPLASTY  WITH STENT PLACEMENT  06/18/2014   PCI to SVG-RPDA 06/18/14 (Xience Alpine DES 3.0 x 18 mm -3.35 mm),   . CORONARY ARTERY BYPASS GRAFT  12/15/2010   X2, Dr Servando Snare; LIMA to LAD, SVG to PDA;   . CORONARY/GRAFT ANGIOGRAPHY N/A 08/27/2017   Procedure: CORONARY/GRAFT ANGIOGRAPHY;  Surgeon: Nelva Bush, MD;  Location: [place] CV LAB;  Service: Cardiovascular;; pLAD 70%, ostD1 50%.  mCx 60%, OM2 80%. pRCA 95% & mRCA 100% - rPDA 70%. LIMA-mLAD patent. SVG-rPDA 10% ISR.   Marland Kitchen CYSTOSCOPY WITH URETHRAL DILATATION    . IR ANGIO INTRA EXTRACRAN SEL COM CAROTID INNOMINATE BILAT MOD SED  02/25/2017  . IR ANGIO INTRA EXTRACRAN SEL COM CAROTID INNOMINATE BILAT MOD SED  10/19/2017   Dr. Kathee Delton: L Common Carotid - ECA &  major branches widely patent. ICA ~20% distal to bulb & 50% in supraclinoid segment. LMCA-distal 1/3 MI ~905 stenosis with post-stenotic dilation into inferior division. ~50% prox Basilar A stenosis @ anterior Inf Cerebellar A. 50% R ICA  . IR ANGIO VERTEBRAL SEL SUBCLAVIAN INNOMINATE UNI L MOD SED  02/25/2017  . IR ANGIO VERTEBRAL SEL SUBCLAVIAN INNOMINATE UNI R MOD SED  10/19/2017  . IR ANGIO VERTEBRAL SEL VERTEBRAL UNI L MOD SED  10/19/2017  . IR GENERIC HISTORICAL  01/21/2016   IR RADIOLOGIST EVAL & MGMT 01/21/2016 [facility]-INTERV RAD  . IR GENERIC HISTORICAL  02/03/2016   IR CATHETER TUBE CHANGE 02/03/2016 Marybelle Killings, MD [facility]-INTERV RAD  . IR RADIOLOGIST EVAL & MGMT  11/09/2017  . LEFT HEART CATHETERIZATION WITH CORONARY ANGIOGRAM N/A 06/18/2014   Procedure: LEFT HEART CATHETERIZATION WITH CORONARY ANGIOGRAM;  Surgeon: Leonie Man, MD;  Location: [facility] CATH LAB;  Service: Cardiovascular;  -- severe disease of SVG-rPDA  . RADIOLOGY WITH ANESTHESIA N/A 01/24/2015   Procedure: STENT ASSISTED ANGIOPLASTY (RADIOLOGY WITH ANESTHESIA);  Surgeon: Luanne Bras, MD;  Location: [place];  Service: Radiology;  Laterality: N/A;  . TONSILLECTOMY    . TRANSTHORACIC ECHOCARDIOGRAM  3/'14; 11/'16:11/'17   a. EF 55-60%,  severe Conc LVH, Nl Systolic fxn, G1 DD,  Ao Sclerosis w/o stenosis; CRO PFO;; b. Moderate concentric LVH. EF 60-65%. No RWMA, Gr 1 DD. Mild LA dilation;; c) HK of the distal inferior wall. EF 60-65%.  GR 1 DD. Ascending aorta/root 45-46 mm.  . TRANSTHORACIC ECHOCARDIOGRAM  08/2017    EF 60-65%. Mild LVH. No RWMA. Gr 1 DD. Mod LA dilation.     Allergies:  Other; Atorvastatin; Crestor [rosuvastatin]; Pravastatin; and Simvastatin Prior to Admit Meds:   Medications Prior to Admission  Medication Sig Dispense Refill Last Dose  . amLODipine (NORVASC) 10 MG tablet Take 10 mg by mouth every evening.    12/17/2017 at Unknown time  . aspirin 81 MG chewable tablet Chew 1 tablet (81 mg total) by mouth daily.   12/18/2017 at 0800  . [EXPIRED] cephALEXin (KEFLEX) 500 MG capsule Take 1 capsule (500 mg total) by mouth 3 (three) times daily for 7 days. 21 capsule 0 12/18/2017 at AM  . clopidogrel (PLAVIX) 75 MG tablet Take 1 tablet (75 mg total) by mouth daily. 90 tablet 3 12/18/2017 at Unknown time  . doxycycline (MONODOX) 100 MG capsule Take 100 mg by mouth 2 (two) times daily. 10 day course started 12/09/17   12/17/2017 at Unknown time  . furosemide (LASIX) 20 MG tablet Take 1 tablet (20 mg total) by mouth as needed. (Patient taking differently: Take 20 mg by mouth daily as needed for fluid (swelling). ) 90 tablet 0 Past Week at Unknown time  . glimepiride (AMARYL) 4 MG tablet Take 4 mg by mouth 2 (two) times daily with a meal.    12/18/2017 at Unknown time  . hydrALAZINE (APRESOLINE) 100 MG tablet Take 1 tablet (100 mg total) by mouth 3 (three) times daily. 270 tablet 3 12/18/2017 at Unknown time  . isosorbide mononitrate (IMDUR) 30 MG 24 hr tablet Take 30 mg by mouth every evening.   12/17/2017 at Unknown time  . labetalol (NORMODYNE) 200 MG tablet Take 200 mg by mouth 2 (two) times daily.    12/18/2017 at 0800  . losartan-hydrochlorothiazide (HYZAAR) 100-25 MG tablet Take 1 tablet by mouth every evening. (Patient taking  differently: Take 1 tablet by mouth every morning. ) 90 tablet 3 12/18/2017 at Unknown time  .  metFORMIN (GLUCOPHAGE) 1000 MG tablet Take 1 tablet (1,000 mg total) by mouth 2 (two) times daily.   12/18/2017 at Unknown time  . nitroGLYCERIN (NITROSTAT) 0.4 MG SL tablet Place 1 tablet (0.4 mg total) under the tongue every 5 (five) minutes as needed for chest pain (up to 3 doses). 25 tablet 3 unk  . oxybutynin (DITROPAN) 5 MG tablet Take 5 mg by mouth daily as needed for bladder spasms.    Past Week at Unknown time  . isosorbide mononitrate (IMDUR) 60 MG 24 hr tablet Take 1 tablet (60 mg total) by mouth daily. (Patient not taking: Reported on 12/18/2017) 90 tablet 3 Not Taking at Unknown time   Fam HX:    Family History  Problem Relation Age of Onset  . Heart Problems Father 65   Social HX:    Social History   Socioeconomic History  . Marital status: Married    Spouse name: [name]  . Number of children: 1  . Years of education: Not on file  . Highest education level: Not on file  Occupational History    Employer: RETIRED  Social Needs  . Financial resource strain: Not on file  . Food insecurity:    Worry: Not on file    Inability: Not on file  . Transportation needs:    Medical: Not on file    Non-medical: Not on file  Tobacco Use  . Smoking status: Former Smoker    Packs/day: 0.33    Years: 10.00    Pack years: 3.30    Types: Cigarettes    Last attempt to quit: 1961    Years since quitting: 58.6  . Smokeless tobacco: Never Used  Substance and Sexual Activity  . Alcohol use: No  . Drug use: No  . Sexual activity: Never  Lifestyle  . Physical activity:    Days per week: Not on file    Minutes per session: Not on file  . Stress: Not on file  Relationships  . Social connections:    Talks on phone: Not on file    Gets together: Not on file    Attends religious service: Not on file    Active member of club or organization: Not on file    Attends meetings of clubs or  organizations: Not on file    Relationship status: Not on file  . Intimate partner violence:    Fear of current or ex partner: Not on file    Emotionally abused: Not on file    Physically abused: Not on file    Forced sexual activity: Not on file  Other Topics Concern  . Not on file  Social History Narrative   Married father of one.  Previously uses stationary bike routinely.  Now reduced due to other social stressors.   Quit smoking 50 years ago.  Does not drink alcohol     ROS:  All  ROS were addressed and are negative except what is stated in the HPI  Physical Exam: Blood pressure (!) 150/72, pulse 78, temperature 98.4 F (36.9 C), temperature source Oral, resp. rate 18, height 6\' 1"  (1.854 m), weight 218 lb 14.7 oz (99.3 kg), SpO2 97 %.    General: Well developed, well nourished, in no acute distress Head: Eyes PERRLA, No xanthomas.   Normal cephalic and atramatic  Lungs:   Clear bilaterally to auscultation and percussion. Heart:   HRRR S1 S2 Pulses are 2+ & equal.  No carotid bruit. No JVD.  No abdominal bruits. No femoral bruits. Abdomen: Bowel sounds are positive, abdomen soft and non-tender without masses or                  Hernia's noted. Msk:  Back normal, normal gait. Normal strength and tone for age. Extremities:   No clubbing, cyanosis or edema.  DP +1 Neuro: Alert and oriented X 3. Psych:  Good affect, responds appropriately    Labs:   Lab Results  Component Value Date   WBC 10.7 (H) 12/21/2017   HGB 8.9 (L) 12/21/2017   HCT 27.2 (L) 12/21/2017   MCV 90.4 12/21/2017   PLT 224 12/21/2017    Recent Labs  Lab 12/18/17 1351  12/21/17 0857  NA  --    < > 136  K  --    < > 3.8  CL  --    < > 105  CO2  --    < > 21*  BUN  --    < > 36*  CREATININE  --    < > 1.59*  CALCIUM  --    < > 8.1*  PROT 6.3*  --   --   BILITOT 1.0  --   --   ALKPHOS 67  --   --   ALT 10  --   --   AST 16  --   --   GLUCOSE  --    < > 278*   < > = values in this  interval not displayed.   No results found for: PTT Lab Results  Component Value Date   INR 1.02 10/19/2017   INR 1.0 08/25/2017   INR 1.05 02/25/2017   Lab Results  Component Value Date   CKTOTAL 89 03/26/2016   CKMB 4.9 03/26/2016   TROPONINI 0.04 (HH) 12/20/2017     Lab Results  Component Value Date   CHOL 134 01/11/2017   CHOL 152 03/26/2016   CHOL 110 01/22/2015   Lab Results  Component Value Date   HDL 40 01/11/2017   HDL 47 03/26/2016   HDL 44 01/22/2015   Lab Results  Component Value Date   LDLCALC 77 01/11/2017   LDLCALC 89 03/26/2016   LDLCALC 61 01/22/2015   Lab Results  Component Value Date   TRIG 83 01/11/2017   TRIG 80 03/26/2016   TRIG 24 01/22/2015   Lab Results  Component Value Date   CHOLHDL 3.4 01/11/2017   CHOLHDL 3.2 03/26/2016   CHOLHDL 2.5 01/22/2015   No results found for: LDLDIRECT    Radiology:  No results found.   Telemetry    Normal sinus rhythm- Personally Reviewed  ECG    Normal sinus rhythm with PACs and inferior infarct, no acute ST changes- Personally Reviewed   ASSESSMENT/PLAN:   1.  Chest pain -This was very short-lived and resolved with one sublingual nitroglycerin -EKG is nonischemic -Echo May 2019 showed normal LV function with EF 60 to 65% with grade 1 diastolic dysfunction. -Recent cath 04/2018 showed severe three-vessel coronary disease with an occluded mid RCA otherwise no change since cath in November 2017 with widely patent LIMA to the LAD.  He had a patent SVG to the RPDA with minimal in-stent restenosis.  Management was recommended and his antianginal therapy was tightened. -was seen by Dr. Ellyn Hack back on 8 3 and admitted to having an episode of chest pain back at the end of July when he was in the  ER for a UTI but that was fleeting and resolved on its own. -Chest pain has been felt to be due to poorly controlled hypertension. -Troponin is minimally elevated with flat trend and likely due to demand  ischemia in the setting of sepsis and pyelonephritis. -Given recent cath 3 months ago showing patent stent would not pursue any further ischemic work-up at this time. -Continue aspirin 81 mg daily, Plavix 75 mg daily -Increase Imdur to 60 mg daily (he was on 60 mg at home but is only on 30 mg here and that may be part of the reason he developed angina) -Change labetalol to carvedilol 12.5 mg twice daily -Continue amlodipine 10 mg daily -Needs aggressive control of his hypertension  2.  Hypertension -BP remains poorly controlled -likely related to some of his home medications being held on admission due to sepsis -Continue amlodipine 10 mg daily -Stop labetalol and change to carvedilol 12.5 mg twice daily and titrate as needed for blood pressure control -He was on losartan HCT 100-25 mg as an outpatient but this is on hold due to acute on chronic kidney disease -Restart hydralazine 100mg  TID which was his home dose prior to admission  3.  Hyperlipidemia -LDL goal less than 70 -He is statin intolerant  Fransico Him, MD  12/21/2017  2:57 PM

## 2017-12-21 NOTE — Progress Notes (Signed)
Medicine attending: I examined this patient today together with resident physician Dr Lonia Skinner and I concur with her evaluation and management plan which we discussed together. We greatly appreciate prompt cardiology evaluation.  We will follow recommendations.  Increase Imdur.  Optimize blood pressure control. No further episodes of chest pain.  As noted, his chest discomfort did appear ischemic in nature and was relieved by nitroglycerin. He remains afebrile.  Blood cultures negative.  He will transition off parenteral antibiotics and begin a course of Cipro. GU examination reveals retraction of the glans penis.  A hard, right testicular mass approximately 5 cm in diameter causing the patient progressive discomfort.  We will get a testicular ultrasound in anticipation of his follow-up visit with his urologist.

## 2017-12-21 NOTE — Care Management Important Message (Signed)
Important Message  Patient Details  Name: [name] MRN: [id number] Date of Birth: 10/17/1930   Medicare Important Message Given:  Yes    Jaidynn Balster 12/21/2017, 10:10 AM

## 2017-12-21 NOTE — Progress Notes (Signed)
   Subjective:   No acute events overnight.  He denies any chest pain today and reports that the nitroglycerin helped. She reports feeling better than when he came in. He has gotten up and walked around a bit yesterday. Denies any fevers, chills, shortness of breath or abdominal pain. We discussed the results of the EKG and troponin's and discussed that given his history and risk factors we asked cardiology to just come and assess him. He did discuss with the attending a new concern of testicular hardening and tenderness which has been going on for awhile but has just been getting worse. He does follow with urology.    Objective:  Vital signs in last 24 hours: Vitals:   12/20/17 0930 12/20/17 1759 12/20/17 2054 12/21/17 0524  BP: 131/60 (!) 152/86 (!) 176/74 (!) 162/73  Pulse: 63 91 80 (!) 55  Resp: 20 18 20 20   Temp: 99.1 F (37.3 C) 98.7 F (37.1 C) 98.2 F (36.8 C) 98.3 F (36.8 C)  TempSrc: Oral Oral Oral Oral  SpO2: 99% 91% 94% 91%  Weight:      Height:       General: Well nourished, well appearing, NAD HEENT: Normocephalic, atraumatic, midline trachea Cardiac: RRR, no murmurs, rubs or gallops Pulmonary: Lungs CTA bilaterally, no wheezing rhonchi or rales Abdomen: Soft, non-tender, +BS, suprapubic catheter in place with minimal drainage around it, no erythema or tenderness Extremity: No LE edema, no lesions noted Neuro: Alert and oriented x3, moves all extremities Psychiatry: Normal mood and affect   Assessment/Plan: This is an 84 year old male with a past medical history of hypertension, diabetes mellitus, coronary artery disease(status post PCI with DESin SVG to PDA),CVA presenting with generalized weakness, fatigue, and abdominal pain.  Catheter associated UTI, pyelonephritis:  Patient presented with generalized weakness, generalized soreness and right flank pain and has been on cefepime for this. Patient has been afebrile overnight with downtrending WBCs to 17 yesterday  and 10.7 today. He reports feeling better and denies any abdominal pain today. Patient has been on cefepime and seems to be responding well to the antibiotic.  -Sensitivities came back for the pseudomonas, pharmacy recommended switching from cefepime to cipro -Ciprofloxacin 500mg  BID  Chest pain: Patient started having chest pain on day 3 of admission, substernal that occurred at rest associated with some SOB. He was given nitroglycerin which improved the symptoms. His EKG showed the old inferior infarct, troponins were elevated to 0.04, 0.04 and 0.05. He denied any new chest pain today but given his history and recent CABG we will consult cardiology to assess him.  -Cardiology consult -Continue patients home medications: ASA, imdur, plavix, amlodipine, and labetaolol  Testicular hardening and tenderness: Patient reports that this has been going on for awhile. He does follow with cardiology.  -Scrotal ultrasound -Follow up with urology outpatient  Diabetes mellitus: -Holding home glimepiride and metformin -SSI-Sensitive -CBGs with meals and at bedtime  Essential hypertension:Patient was normotensive overnight. -Continue home labetalol, amlodipine, and hydralazine.  XBM:[id number] is on losartan-hydrochlorothiazide and hydralazine at home. -Holdhome medications  AKI:BUN 27, Cr 1.51 on admission. Patient received 3L NS while in ED.  -Today: BUN 26, Cr 1.96  FEN:No fluids, replete lytes prn,heart healthydiet VTE ppx: Lovenox  Code Status: FULL   Dispo: Anticipated discharge in approximately today.   Asencion Noble, MD 12/21/2017, 6:47 AM Pager: [phone]

## 2017-12-22 DIAGNOSIS — Z9109 Other allergy status, other than to drugs and biological substances: Secondary | ICD-10-CM

## 2017-12-22 DIAGNOSIS — N433 Hydrocele, unspecified: Secondary | ICD-10-CM

## 2017-12-22 DIAGNOSIS — N390 Urinary tract infection, site not specified: Secondary | ICD-10-CM

## 2017-12-22 DIAGNOSIS — M7022 Olecranon bursitis, left elbow: Secondary | ICD-10-CM

## 2017-12-22 DIAGNOSIS — R079 Chest pain, unspecified: Secondary | ICD-10-CM

## 2017-12-22 DIAGNOSIS — B965 Pseudomonas (aeruginosa) (mallei) (pseudomallei) as the cause of diseases classified elsewhere: Secondary | ICD-10-CM

## 2017-12-22 DIAGNOSIS — Z888 Allergy status to other drugs, medicaments and biological substances status: Secondary | ICD-10-CM

## 2017-12-22 LAB — BASIC METABOLIC PANEL
Anion gap: 11 (ref 5–15)
BUN: 41 mg/dL — ABNORMAL HIGH (ref 8–23)
CALCIUM: 8 mg/dL — AB (ref 8.9–10.3)
CO2: 22 mmol/L (ref 22–32)
CREATININE: 1.51 mg/dL — AB (ref 0.61–1.24)
Chloride: 103 mmol/L (ref 98–111)
GFR, EST AFRICAN AMERICAN: 47 mL/min — AB (ref >60)
GFR, EST NON AFRICAN AMERICAN: 41 mL/min — AB (ref >60)
Glucose, Bld: 214 mg/dL — ABNORMAL HIGH (ref 70–99)
Potassium: 3.7 mmol/L (ref 3.5–5.1)
SODIUM: 136 mmol/L (ref 135–145)

## 2017-12-22 LAB — CBC
HCT: 25.6 % — ABNORMAL LOW (ref 39.0–52.0)
Hemoglobin: 8.4 g/dL — ABNORMAL LOW (ref 13.0–17.0)
MCH: 29.4 pg (ref 26.0–34.0)
MCHC: 32.8 g/dL (ref 30.0–36.0)
MCV: 89.5 fL (ref 78.0–100.0)
Platelets: 251 10*3/uL (ref 150–400)
RBC: 2.86 MIL/uL — AB (ref 4.22–5.81)
RDW: 16.2 % — ABNORMAL HIGH (ref 11.5–15.5)
WBC: 8.2 10*3/uL (ref 4.0–10.5)

## 2017-12-22 LAB — GLUCOSE, CAPILLARY
GLUCOSE-CAPILLARY: 185 mg/dL — AB (ref 70–99)
Glucose-Capillary: 281 mg/dL — ABNORMAL HIGH (ref 70–99)

## 2017-12-22 MED ORDER — CARVEDILOL 12.5 MG PO TABS: 12.5000 mg | ORAL_TABLET | Freq: Two times a day (BID) | ORAL | Status: DC

## 2017-12-22 MED ORDER — ISOSORBIDE MONONITRATE ER 60 MG PO TB24: 60.0000 mg | ORAL_TABLET | Freq: Every evening | ORAL | 3 refills | Status: DC

## 2017-12-22 MED ORDER — CARVEDILOL 12.5 MG PO TABS: 12.5000 mg | ORAL_TABLET | Freq: Two times a day (BID) | ORAL | 3 refills | Status: DC

## 2017-12-22 MED ORDER — CARVEDILOL 25 MG PO TABS: 25.0000 mg | ORAL_TABLET | Freq: Two times a day (BID) | ORAL | Status: DC

## 2017-12-22 MED ORDER — CIPROFLOXACIN HCL 500 MG PO TABS: 500.0000 mg | ORAL_TABLET | Freq: Two times a day (BID) | ORAL | 0 refills | Status: AC

## 2017-12-22 NOTE — Progress Notes (Signed)
Medicine attending discharge note: I personally examined this patient on the day of discharge and I attest to the accuracy of the discharge evaluation and plan as recorded in the final progress note by resident physician Dr Lonia Skinner in which will be subsequently documented in her formal discharge note.  Pleasant 84 year old man with known ischemic cardiomyopathy admitted with abdominal and flank pain.  Urine appeared grossly infected.  CT scan suggested early bilateral pyelonephritis with nonspecific perinephric stranding.  Potential source of infection is a chronic suprapubic catheter placed about 2 years ago for urethral stricture.  His catheter was exchanged.  Urine grew Pseudomonas.  Blood cultures remain sterile.  He was put on parenteral antibiotics for greater than 72 hours and will be transitioned to oral antibiotics with ciprofloxacin. He complained of pain and swelling in the right testicle.  Testicle was firm on exam and enlarged.  Testicular ultrasound showed a benign hydrocele.  Further follow-up with urology as an outpatient. He developed a single episode of ischemic quality chest pain with a minimal elevation in troponin of 0.05.  No acute changes on EKG.  Pain self-limited and resolved with a single sublingual nitroglycerin tablet.  Cardiology evaluated the patient.  He was inadvertently not on the full dose of long-acting nitrate that he was taking as an outpatient and this was corrected.  Blood pressure medications were adjusted for more optimal control.  Given recent stable cardiac catheterization just done in April, and the self-limited nature of the pain, no further evaluation recommended at this time.  Disposition: Condition stable at time of discharge Follow-up with his primary care physician Dr. Suzan Garibaldi, with cardiology Dr. Ellyn Hack, and with urology Dr. Diona Fanti. There were no complications

## 2017-12-22 NOTE — Progress Notes (Signed)
   Subjective: Patient has had no acute events overnight. He slept well and is feeling good. He denies any chest pain, SOB, abdominal pain, fevers or chills. He had a BM yesterday. We discussed the resumption of his blood pressure medications and how this could be related to the chest pain that he had yesterday. We also discussed the results of his scrotal ultrasound and talked about following up with urology. He feels good and is ready to go home.    Objective:  Vital signs in last 24 hours: Vitals:   12/21/17 0926 12/21/17 1824 12/21/17 2110 12/22/17 0459  BP: (!) 150/72 (!) 169/80 137/68 (!) 146/70  Pulse: 78 67 61 (!) 58  Resp: 18 18 (!) 22 20  Temp: 98.4 F (36.9 C) 98.6 F (37 C) 98.4 F (36.9 C) 97.8 F (36.6 C)  TempSrc: Oral Oral Oral Oral  SpO2: 97% 94% 90% 94%  Weight:    220 lb 7.4 oz (100 kg)  Height:       General: Well appearing, well nourished, NAD HEENT: Normocephalic, atraumatic, midline trachea Cardiac: Normal S1, S2, RRR, no murmurs, rubs or gallops, pulses are 2+ and equal Pulmonary: Lungs CTA bilaterally, no wheezing, rhonchi or rales Abdomen: Soft, non-tender, +BS, suprapubic catheter in place with minimal drainage around the tube but no tenderness or erythema Extremity: No LE edema, non-traumatic Neuro: Alert and oriented x3, moves all extremities Psychiatry: Normal mood and affect  Assessment/Plan: This is an 84 year old male with a past medical history of hypertension, diabetes mellitus, coronary artery disease(status post PCI with DESin SVG to PDA),CVA presenting with generalized weakness, fatigue, and abdominal pain. Found to have signs and symptoms of pyelonephritis and UTI  UTI, Pyelonephritis 2/2 suprapubic catheter: Patient presented with generalized weakness, generalized pain, and right flank pain. Found to have findings suggestive of pyelonephritis and a UTI with pseudomonas. Was switched from cefepime to oral ciprofloxacin yesterday. Patient has  been improving, he remains afebrile overnight and his WBC decreased to 8.2 today. Patient will follow up with urology and PCP.  -Discharge on oral ciprofloxacin 500mg  BID  Chest pain: Patient has been evaluated by cardiology who restarted his hydralazine and corrected his imdur to his home dose. They feel that the chest pain was due to poorly controlled hypertension, troponin was minimally elevated with a flat trend and likely due to demand ischemia in setting of sepsis and pyelonephritis. They did change labetalol to carvedilol 12.5 mg BID.  -Outpatient cardiology follow up   Testicular hardening and tenderness: Scrotal ultrasound showed no testicular torsion or mass, it did show a right varicocele.  -Follow up with outpatient urology  AKI: BUN 27, Cr 1.51 on admission. Patient received 3L NS while in ED. Today his BUN 41 and Cr 1.51.   HTN: Patients hydralazine was restarted and labetalol was changed to carvedilol yesterday. BP 146/70 this morning. Will restart his home medications on discharge.    Diabetes mellitus: Home medications were held on admission. Placed on SSI-sensitive and daily CBGs. He has been hyperglycemic on admission, will restart home medications on discharge and follow up with PCP.   CNO:[id number] is on losartan-hydrochlorothiazide and hydralazine at home. -Continue home meds on discharge  FEN: No fluids, replete lytes prn, heart health diet VTE ppx: Lovenox  Code Status: FULL   Dispo: Anticipated discharge in approximately today.   Asencion Noble, MD 12/22/2017, 6:43 AM Pager: [phone]

## 2017-12-22 NOTE — Progress Notes (Signed)
Patient discharged to home. Friend is taking patient home.  After visit Summary reviewed. Patient capable of reverbalizing medications and follow up visits. No signs and symptoms of distress noted. Patient educated to return to the ED in the case of an emergency. Dillon Bjork RN

## 2017-12-22 NOTE — Progress Notes (Signed)
Progress Note  Patient Name: [name] Date of Encounter: 12/22/2017  Primary Cardiologist: Glenetta Hew, MD   Subjective   Denies any chest pain or shortness of breath.  He is eager to go home.  Inpatient Medications    Scheduled Meds: . amLODipine  10 mg Oral QPM  . aspirin  81 mg Oral Daily  . carvedilol  12.5 mg Oral BID WC  . ciprofloxacin  500 mg Oral BID  . clopidogrel  75 mg Oral Daily  . docusate sodium  200 mg Oral Daily  . enoxaparin (LOVENOX) injection  40 mg Subcutaneous Q24H  . hydrALAZINE  100 mg Oral Q8H  . insulin aspart  0-5 Units Subcutaneous QHS  . insulin aspart  0-9 Units Subcutaneous TID WC  . isosorbide mononitrate  60 mg Oral QPM   Continuous Infusions: . sodium chloride     PRN Meds: sodium chloride, nitroGLYCERIN   Vital Signs    Vitals:   12/21/17 1824 12/21/17 2110 12/22/17 0459 12/22/17 0956  BP: (!) 169/80 137/68 (!) 146/70 140/68  Pulse: 67 61 (!) 58 (!) 59  Resp: 18 (!) 22 20 20   Temp: 98.6 F (37 C) 98.4 F (36.9 C) 97.8 F (36.6 C) 98.6 F (37 C)  TempSrc: Oral Oral Oral Oral  SpO2: 94% 90% 94% 93%  Weight:   220 lb 7.4 oz (100 kg)   Height:        Intake/Output Summary (Last 24 hours) at 12/22/2017 1037 Last data filed at 12/22/2017 0656 Gross per 24 hour  Intake 240 ml  Output 900 ml  Net -660 ml   Filed Weights   12/18/17 2128 12/18/17 2229 12/22/17 0459  Weight: 218 lb 14.7 oz (99.3 kg) 218 lb 14.7 oz (99.3 kg) 220 lb 7.4 oz (100 kg)    Telemetry    He is not on telemetry- Personally Reviewed  ECG    No new EKG to review- Personally Reviewed  Physical Exam   GEN: No acute distress.   Neck: No JVD Cardiac: RRR, no murmurs, rubs, or gallops.  Respiratory: Clear to auscultation bilaterally. GI: Soft, nontender, non-distended  MS: No edema; No deformity. Neuro:  Nonfocal  Psych: Normal affect   Labs    Chemistry Recent Labs  Lab 12/18/17 1351  12/20/17 0740 12/21/17 0857 12/22/17 0438  NA   --    < > 136 136 136  K  --    < > 4.0 3.8 3.7  CL  --    < > 106 105 103  CO2  --    < > 22 21* 22  GLUCOSE  --    < > 139* 278* 214*  BUN  --    < > 36* 36* 41*  CREATININE  --    < > 1.78* 1.59* 1.51*  CALCIUM  --    < > 8.1* 8.1* 8.0*  PROT 6.3*  --   --   --   --   ALBUMIN 3.6  --   --   --   --   AST 16  --   --   --   --   ALT 10  --   --   --   --   ALKPHOS 67  --   --   --   --   BILITOT 1.0  --   --   --   --   GFRNONAA  --    < > 33* 38* 41*  GFRAA  --    < > 39* 44* 47*  ANIONGAP  --    < > 8 10 11    < > = values in this interval not displayed.     Hematology Recent Labs  Lab 12/20/17 0740 12/21/17 0654 12/22/17 0438  WBC 17.1* 10.7* 8.2  RBC 3.19* 3.01* 2.86*  HGB 9.4* 8.9* 8.4*  HCT 29.5* 27.2* 25.6*  MCV 92.5 90.4 89.5  MCH 29.5 29.6 29.4  MCHC 31.9 32.7 32.8  RDW 16.8* 16.4* 16.2*  PLT 207 224 251    Cardiac Enzymes Recent Labs  Lab 12/20/17 0740 12/20/17 1423 12/20/17 2004  TROPONINI 0.05* 0.04* 0.04*   No results for input(s): TROPIPOC in the last 168 hours.   BNPNo results for input(s): BNP, PROBNP in the last 168 hours.   DDimer No results for input(s): DDIMER in the last 168 hours.   Radiology    US Scrotum  Result Date: 12/21/2017 CLINICAL DATA:  84 year old with bilateral testicular hardness and tenderness. EXAM: SCROTAL ULTRASOUND DOPPLER ULTRASOUND OF THE TESTICLES TECHNIQUE: Complete ultrasound examination of the testicles, epididymis, and other scrotal structures was performed. Color and spectral Doppler ultrasound were also utilized to evaluate blood flow to the testicles. COMPARISON:  CT of the pelvis 12/18/2017 FINDINGS: Right testicle Measurements: 4.0 x 2.9 x 3.5 cm. No mass or microlithiasis visualized. Testicle is mildly heterogeneous. Left testicle Measurements: 4.5 x 3.0 x 3.3 cm. No mass or microlithiasis visualized. Testicle is mildly heterogeneous. Right epididymis:  Normal in size and appearance. Left epididymis:  Normal in  size and appearance. Hydrocele:  Small bilateral hydroceles. Varicocele:  Right varicocele. Pulsed Doppler interrogation of both testes demonstrates normal low resistance arterial and venous waveforms bilaterally. IMPRESSION: Negative for testicular torsion.  No evidence for a testicular mass. Right varicocele. Electronically Signed   By: Markus Daft M.D.   On: 12/21/2017 19:52   US Pelvic Doppler (torsion R/o Or Mass Arterial Flow)  Result Date: 12/21/2017 CLINICAL DATA:  84 year old with bilateral testicular hardness and tenderness. EXAM: SCROTAL ULTRASOUND DOPPLER ULTRASOUND OF THE TESTICLES TECHNIQUE: Complete ultrasound examination of the testicles, epididymis, and other scrotal structures was performed. Color and spectral Doppler ultrasound were also utilized to evaluate blood flow to the testicles. COMPARISON:  CT of the pelvis 12/18/2017 FINDINGS: Right testicle Measurements: 4.0 x 2.9 x 3.5 cm. No mass or microlithiasis visualized. Testicle is mildly heterogeneous. Left testicle Measurements: 4.5 x 3.0 x 3.3 cm. No mass or microlithiasis visualized. Testicle is mildly heterogeneous. Right epididymis:  Normal in size and appearance. Left epididymis:  Normal in size and appearance. Hydrocele:  Small bilateral hydroceles. Varicocele:  Right varicocele. Pulsed Doppler interrogation of both testes demonstrates normal low resistance arterial and venous waveforms bilaterally. IMPRESSION: Negative for testicular torsion.  No evidence for a testicular mass. Right varicocele. Electronically Signed   By: Markus Daft M.D.   On: 12/21/2017 19:52    Cardiac Studies   2D echo 08/28/2017 Study Conclusions  - Left ventricle: The cavity size was normal. Wall thickness was   increased in a pattern of mild LVH. Systolic function was normal.   The estimated ejection fraction was in the range of 60% to 65%.   Wall motion was normal; there were no regional wall motion   abnormalities. Doppler parameters are  consistent with abnormal   left ventricular relaxation (grade 1 diastolic dysfunction).   Doppler parameters are consistent with indeterminate ventricular   filling pressure. - Aortic valve: Transvalvular velocity was within  the normal range.   There was no stenosis. There was no regurgitation. - Aorta: Ascending aortic diameter: 43 mm (S). - Ascending aorta: The ascending aorta was mildly dilated. - Mitral valve: Transvalvular velocity was within the normal range.   There was no evidence for stenosis. There was no regurgitation. - Left atrium: The atrium was moderately dilated. - Right ventricle: The cavity size was normal. Wall thickness was   normal. Systolic function was normal. - Atrial septum: No defect or patent foramen ovale was identified   by color flow Doppler. - Tricuspid valve: There was trivial regurgitation. - Pulmonary arteries: Systolic pressure was within the normal   range. PA peak pressure: 21 mm Hg (S).  Cardiac cath 08/27/2017 Conclusions: 1. Severe 3-vessel coronary artery disease, as detailed below.  Mid RCA is now occluded.  Otherwise, there has been no significant change since 03/2016. 2. Widely patent LIMA-LAD. 3. Patent SVG-rPDA. Patent SVG overlapping stents have minimal in-stent restenosis. 4. Inability to cross aortic valve from the left radial. 5. Systemic hypertension  Recommendations: 1. Escalate medical therapy; will increase isosorbide mononitrate and hydralazine. 2. Obtain echocardiogram to assess LVEF. 3. Admit for overnight extended recovery, as the patient does not have a caregiver to stay with him overnight.   Patient Profile     84 y.o. male with a history of hypertension, diabetes mellitus type 2, history of CVA with 90% left MCA stenosis followed by Dr. Kathee Delton and hyperlipidemia.  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 fib with no recurrence.    Admitted with urosepsis and acute pyelonephritis and had episode of chest pain during hospital stay  Assessment & Plan    1.  Chest pain -This was very short-lived and resolved with one sublingual nitroglycerin -he was off most of his antihypertensive medication and on the lower dose of indoor from what he takes at home during his hospitalization - he has not had any further episodes of chest pain after getting back on antianginal medications -EKG is nonischemic -Echo May 2019 showed normal LV function with EF 60 to 65% with grade 1 diastolic dysfunction. -Recent cath 04/2018 showed severe three-vessel coronary disease with an occluded mid RCA otherwise no change since cath in November 2017 with widely patent LIMA to the LAD.  He had a patent SVG to the RPDA with minimal in-stent restenosis.    Medical management was recommended and his antianginal therapy was tightened after that cath  -was seen by Dr. Ellyn Hack back on 8 3 and admitted to having an episode of chest pain back at the end of July when he was in the ER for a UTI but that was fleeting and resolved on its own. -Chest pain has been felt to be due to poorly controlled hypertension. -Troponin is minimally elevated with flat trend and likely due to demand ischemia in the setting of sepsis and pyelonephritis. -Given recent cath 3 months ago showing patent stent would not pursue any further ischemic work-up at this time. -Continue aspirin 81 mg daily, Plavix 75 mg daily -Continue home dose of Imdur to 60 mg daily   -Continue carvedilol 12.5 mg twice daily -cannot titrate further due to borderline bradycardia -Continue amlodipine 10 mg daily -Needs aggressive control of his hypertension  2.  Hypertension -BP improved but remains elevated this a.m.  -His losartan HCT has been on hold due  to acute on chronic kidney disease -Continue amlodipine 10 mg daily, carvedilol 12.5 mg twice daily and  hydralazine 100 mg 3 times daily -Restart losartan 100 mg daily without diuretic  3.  Hyperlipidemia -LDL goal less than 70 -He is statin intolerant  Think he is stable from a cardiac standpoint for discharge.  The episode of angina he had is likely related to poorly controlled blood pressure being off part of his antihypertensive medications as well is on the lower dose of Imdur during his hospital stay.  He has not had any further chest pain after increasing Imdur back to home dose of 60 mg daily.   [facility] will sign off.   Medication Recommendations: Carvedilol 12.5 mg twice daily, hydralazine 100 mg 3 times daily, amlodipine 10 mg daily, Imdur 60 mg daily.  Restart losartan at 100 mg daily without diuretic, aspirin 81 mg daily, Plavix 75 mg daily. Other recommendations (labs, testing, etc): Bmet in 1 week Follow up as an outpatient: We will set up follow-up with Dr. Ellyn Hack in the office in 1 to 2 weeks  For questions or updates, please contact [place] Please consult [email or web address] for contact info under Cardiology/STEMI.      Signed, Fransico Him, MD  12/22/2017, 10:37 AM

## 2017-12-22 NOTE — Discharge Instructions (Signed)
[name],   It has been a pleasure working with you and we are glad you're feeling better. You were hospitalized for pyelonephritis(kidney infection), and had some chest pain and urologic concerns.   For your pyelonephritis,  START taking ciprofloxacin twice a day for 5 days Follow up with your primary care provider next week on August 15th at 9:30.   For your chest pain,  START taking carvedilol 12.5 twice a day STOP taking labetalol  Make sure to be taking the 60mg  Imdur that was your original home dose.  Continue other home medications Follow up with Cardiology  On August 27th at 8:30.   For your urologic concerns,  Please follow up with your urologist to discuss those concerns.  Urology appointment on August 16th at 10:30   Follow up with your primary care provider in 1-2 weeks  If your symptoms worsen or you develop new symptoms, please seek medical help whether it is your primary care provider or emergency department.  If you have any questions about this hospitalization please call [phone].

## 2017-12-23 LAB — CULTURE, BLOOD (ROUTINE X 2)
Culture: NO GROWTH
Culture: NO GROWTH
SPECIAL REQUESTS: ADEQUATE

## 2017-12-30 DIAGNOSIS — J45909 Unspecified asthma, uncomplicated: Secondary | ICD-10-CM | POA: Diagnosis not present

## 2017-12-30 DIAGNOSIS — R5383 Other fatigue: Secondary | ICD-10-CM | POA: Diagnosis not present

## 2017-12-30 DIAGNOSIS — I10 Essential (primary) hypertension: Secondary | ICD-10-CM | POA: Diagnosis not present

## 2017-12-31 DIAGNOSIS — N3000 Acute cystitis without hematuria: Secondary | ICD-10-CM | POA: Diagnosis not present

## 2017-12-31 DIAGNOSIS — R3914 Feeling of incomplete bladder emptying: Secondary | ICD-10-CM | POA: Diagnosis not present

## 2018-01-03 ENCOUNTER — Ambulatory Visit: Payer: Medicare Other | Admitting: Neurology

## 2018-01-05 DIAGNOSIS — K2901 Acute gastritis with bleeding: Secondary | ICD-10-CM | POA: Diagnosis not present

## 2018-01-05 DIAGNOSIS — K2980 Duodenitis without bleeding: Secondary | ICD-10-CM | POA: Diagnosis not present

## 2018-01-05 DIAGNOSIS — K5730 Diverticulosis of large intestine without perforation or abscess without bleeding: Secondary | ICD-10-CM | POA: Diagnosis not present

## 2018-01-05 DIAGNOSIS — D649 Anemia, unspecified: Secondary | ICD-10-CM | POA: Diagnosis not present

## 2018-01-05 DIAGNOSIS — R195 Other fecal abnormalities: Secondary | ICD-10-CM | POA: Diagnosis not present

## 2018-01-05 DIAGNOSIS — Z791 Long term (current) use of non-steroidal anti-inflammatories (NSAID): Secondary | ICD-10-CM | POA: Diagnosis not present

## 2018-01-05 DIAGNOSIS — F649 Gender identity disorder, unspecified: Secondary | ICD-10-CM | POA: Diagnosis not present

## 2018-01-05 DIAGNOSIS — K3189 Other diseases of stomach and duodenum: Secondary | ICD-10-CM | POA: Diagnosis not present

## 2018-01-05 DIAGNOSIS — Z9119 Patient's noncompliance with other medical treatment and regimen: Secondary | ICD-10-CM | POA: Diagnosis not present

## 2018-01-05 DIAGNOSIS — Z5309 Procedure and treatment not carried out because of other contraindication: Secondary | ICD-10-CM | POA: Diagnosis not present

## 2018-01-10 NOTE — Progress Notes (Signed)
Cardiology Office Note:    Date:  01/11/2018   ID:  [name], DOB 12/14/1930, MRN [id number]  PCP:  Suzan Garibaldi, FNP  Cardiologist:  Glenetta Hew, MD   Referring MD: Suzan Garibaldi, FNP   Chief Complaint  Patient presents with  . Hospitalization Follow-up    chest pain    History of Present Illness:    [name] is a 84 y.o. male with a hx of CAD status post CABG, CVA with 90% left MCA stenosis, hypertension, diabetes mellitus, chronic diastolic heart failure with normal LVEF per echo on 03/2016, dilated aortic root, postoperative A. fib with no recurrence.  Is a complex cardiac history including remote and STEMI in 2012 with CABG (LIMA to LAD and SVG to PDA) and then an STEMI 2016 with DES to SVG to PDA, STEMI 03/2016 with cath showing normal left main, 70% ostial LAD, 50% mid left circumflex, 95% proximal RCA, 60% mid RCA, 70% distal RPDA with patent LIMA to LAD and occluded SVG to RPDA PDA.  DES was placed to the SVG to RPDA.  He had recurrent chest pain in April 2019 and underwent cardiac cath showing severe three-vessel coronary artery disease with newly occluded mid RCA, 70% proximal LAD, widely patent LIMA to the LAD and patent SVG to the RPDA with minimal in-stent restenosis in the SVG.  He had an 80% small OM 2, 60% mid left circumflex and 50% diagonal 1.  Medical therapy was increased.  He was seen in clinic by Dr. Ellyn Hack on 12/18/2017 and was doing well at that time.  He presented to the emergency room on 12/13/2017 with acute cystitis and found to have some mild chest pain thought to be due to uncontrolled blood pressure.  His hydralazine was increased to 100 mg 3 times daily.  He presented to [facility] on 12/18/2017 and was found to be septic.  On 12/20/2017 he developed retrosternal chest pain without radiation and cardiology was consulted.  Chest pain resolved with 1 sublingual nitro.  Decision was made to treat conservatively, further ischemic evaluation was not  pursued.  His Imdur was increased to 60 mg daily which was his home dose.  Medications were titrated for blood pressure.  He was discharged in good condition on 12/22/2017.  He returns today for clinic follow-up.  He states he is doing very well.  He denies chest pain, shortness of breath, orthopnea, lower extremity swelling.  His pressure today is slightly elevated at 162/62.  He states that his blood pressure generally runs a touch high.  He does not wish to make any medication changes today.  He is having no bleeding issues on aspirin and Plavix.  He is compliant on all medications.  He has recovered well since his hospitalization and continues to care for his wife and mother-in-law who is almost 28 years old.  Past Medical History:  Diagnosis Date  . Abdominal aortic aneurysm ([place])    a. Aortic duplex 06/2014: mild aneurysmal dilatation of proximal abdominal aorta measuring 3.4x3.4cm. No sig change from 2012. F/u due 06/2016;   . AKI (acute kidney injury) ([place]) 03/28/2016  . Arthritis    "right knee; never bothered me" (03/26/2016)  . 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 CAD in native artery    a. NSTEMI 11/2010 - CABG x2(LIMA to LAD, SVG to PDA). b. NEG Lexi MV 10/24/13, EF 53%, no perfusion abnormality, septal and apical HK noted. c.  NSTEMI 05/2014 - s/p DES to SVG-RPDA 06/18/14 (Xience Alpine DES 3.0 x 18 mm -3.35 mm), EF 60-65; d. 03/2016 STEMI/PCI: LM nl, ost LAD 70%, mLCx 50%, pRCA 95% - mRCA 60%, dRPDA 70%, LIMA->LAD ok, o-p SVG->RPDA 100% (3.0x16 Promus DES overlapping prior stent).  . Diastolic dysfunction    a. 03/2016 Echo: EF 60-65%, no rwma, Gr1 DD, triv AI, Ao root 48mm, Asc Ao 39mm, triv MR.  . Dizziness    a. Carotid duplex 03/2014: mild fibrous plaque, no significant stenosis.  . Foley catheter in place    "been wearing it for a couple months now" (03/26/2016)  . HTN (hypertension)   . Hyperlipidemia LDL goal <70   . Non-STEMI (non-ST elevated  myocardial infarction) ([place]) 11/2010   1. Ostial LAD 70% (to close to [facility] for PCI), subtotal occlusion of the RCA  . Postoperative atrial fibrillation ([place]) 11/2010   Post CABG, no sign recurrence  . Refusal of blood transfusions as patient is Jehov[employer]'s Witness   . 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 Servando Snare)  . Sleep apnea    Not on CPAP. (03/26/2016)  . ST elevation myocardial infarction (STEMI) of inferior wall ([place]) 04/05/2016   Occluded in-stent restenosis/thrombosis of SVG-RCA --> treated with overlapping Promus DES 3.0 mm 16 mm postdilated 3.0 mm).  . Stroke [facility]) 2014; 01/2015   a. 2014 with mild right hand weakness, nonhemorrhagic per pt.;; b. - PTA-Stent L ICA 95%   . Type II diabetes mellitus ([place])     Past Surgical History:  Procedure Laterality Date  . APPENDECTOMY    . CARDIAC CATHETERIZATION  12/11/2010   Dr. Chase Picket - subsequent cath - normal LV systolic function, no renal artery stenosis, severe 2-vessel disease with subtotaled RCA prox and distal 60% lesion and complex 70% area of narrowing of ostium of LAD  . CARDIAC CATHETERIZATION N/A 03/26/2016   Procedure: Left Heart Cath and Coronary Angiography;  Surgeon: Nelva Bush, MD;  Location: [place] CV LAB;  Service: Cardiovascular;  Laterality: N/A;  . CARDIAC CATHETERIZATION N/A 03/26/2016   Procedure: Coronary Stent Intervention;  Surgeon: Nelva Bush, MD;  Location: [place] CV [facility];  Service: Cardiovascular: 100% In-stent thrombosis of pros SVG-RCA (Xience DES) --> treated with PromusDES 3.0 x 18 (3.3 mm)  . CARDIAC CATHETERIZATION N/A 03/26/2016   Procedure: Bypass Graft Angiography;  Surgeon: Nelva Bush, MD;  Location: [place] CV [facility];  Service: Cardiovascular;  Laterality: N/A;  . CORONARY ANGIOPLASTY WITH STENT PLACEMENT  06/18/2014   PCI to SVG-RPDA 06/18/14 (Xience Alpine DES 3.0 x 18 mm -3.35 mm),   . CORONARY ARTERY BYPASS GRAFT  12/15/2010   X2, Dr Servando Snare; LIMA to LAD, SVG  to PDA;   . CORONARY/GRAFT ANGIOGRAPHY N/A 08/27/2017   Procedure: CORONARY/GRAFT ANGIOGRAPHY;  Surgeon: Nelva Bush, MD;  Location: [place] [facility];  Service: Cardiovascular;; pLAD 70%, ostD1 50%.  mCx 60%, OM2 80%. pRCA 95% & mRCA 100% - rPDA 70%. LIMA-mLAD patent. SVG-rPDA 10% ISR.   Marland Kitchen CYSTOSCOPY WITH URETHRAL DILATATION    . IR ANGIO INTRA EXTRACRAN SEL COM CAROTID INNOMINATE BILAT MOD SED  02/25/2017  . IR ANGIO INTRA EXTRACRAN SEL COM CAROTID INNOMINATE BILAT MOD SED  10/19/2017   Dr. Kathee Delton: L Common Carotid - ECA & major branches widely patent. ICA ~20% distal to bulb & 50% in supraclinoid segment. LMCA-distal 1/3 MI ~905 stenosis with post-stenotic dilation into inferior division. ~50% prox Basilar A stenosis @  anterior Inf Cerebellar A. 50% R ICA  . IR ANGIO VERTEBRAL SEL SUBCLAVIAN INNOMINATE UNI L MOD SED  02/25/2017  . IR ANGIO VERTEBRAL SEL SUBCLAVIAN INNOMINATE UNI R MOD SED  10/19/2017  . IR ANGIO VERTEBRAL SEL VERTEBRAL UNI L MOD SED  10/19/2017  . IR GENERIC HISTORICAL  01/21/2016   IR RADIOLOGIST EVAL & MGMT 01/21/2016 [facility]INTER[facility]  . IR GENERIC HISTORICAL  02/03/2016   IR CATHETER TUBE CHANGE 02/03/2016 Marybelle Killings, MD [facility]INTER[facility] RAD  . IR RADIOLOGIST EVAL & MGMT  11/09/2017  . LEFT HEART CATHETERIZATION WITH CORONARY ANGIOGRAM N/A 06/18/2014   Procedure: LEFT HEART CATHETERIZATION WITH CORONARY ANGIOGRAM;  Surgeon: Leonie Man, MD;  Location: [facility] CATH LAB;  Service: Cardiovascular;  -- severe disease of SVG-rPDA  . RADIOLOGY WITH ANESTHESIA N/A 01/24/2015   Procedure: STENT ASSISTED ANGIOPLASTY (RADIOLOGY WITH ANESTHESIA);  Surgeon: Luanne Bras, MD;  Location: [place];  Service: Radiology;  Laterality: N/A;  . TONSILLECTOMY    . TRANSTHORACIC ECHOCARDIOGRAM  3/'14; 11/'16:11/'17   a. EF 55-60%, severe Conc LVH, Nl Systolic fxn, G1 DD,  Ao Sclerosis w/o stenosis; CRO PFO;; b. Moderate concentric LVH. EF 60-65%. No RWMA, Gr 1 DD. Mild LA dilation;; c) HK of the distal  inferior wall. EF 60-65%.  GR 1 DD. Ascending aorta/root 45-46 mm.  . TRANSTHORACIC ECHOCARDIOGRAM  08/2017    EF 60-65%. Mild LVH. No RWMA. Gr 1 DD. Mod LA dilation.     Current Medications: Current Meds  Medication Sig  . amLODipine (NORVASC) 10 MG tablet Take 10 mg by mouth every evening.   Marland Kitchen aspirin 81 MG chewable tablet Chew 1 tablet (81 mg total) by mouth daily.  . carvedilol (COREG) 12.5 MG tablet Take 1 tablet (12.5 mg total) by mouth 2 (two) times daily with a meal.  . clopidogrel (PLAVIX) 75 MG tablet Take 1 tablet (75 mg total) by mouth daily.  Marland Kitchen glimepiride (AMARYL) 4 MG tablet Take 4 mg by mouth 2 (two) times daily with a meal.   . hydrALAZINE (APRESOLINE) 100 MG tablet Take 1 tablet (100 mg total) by mouth 3 (three) times daily.  . isosorbide mononitrate (IMDUR) 60 MG 24 hr tablet Take 1 tablet (60 mg total) by mouth daily.  . isosorbide mononitrate (IMDUR) 60 MG 24 hr tablet Take 1 tablet (60 mg total) by mouth every evening.  Marland Kitchen losartan-hydrochlorothiazide (HYZAAR) 100-25 MG tablet Take 1 tablet by mouth every evening. (Patient taking differently: Take 1 tablet by mouth every morning. )  . metFORMIN (GLUCOPHAGE) 1000 MG tablet Take 1 tablet (1,000 mg total) by mouth 2 (two) times daily.  . nitroGLYCERIN (NITROSTAT) 0.4 MG SL tablet Place 1 tablet (0.4 mg total) under the tongue every 5 (five) minutes as needed for chest pain (up to 3 doses).  Marland Kitchen oxybutynin (DITROPAN) 5 MG tablet Take 5 mg by mouth daily as needed for bladder spasms.      Allergies:   Other; Atorvastatin; Crestor [rosuvastatin]; Pravastatin; and Simvastatin   Social History   Socioeconomic History  . Marital status: Married    Spouse name: [name]  . Number of children: 1  . Years of education: Not on file  . Highest education level: Not on file  Occupational History    Employer: RETIRED  Social Needs  . Financial resource strain: Not on file  . Food insecurity:    Worry: Not on file    Inability: Not  on file  . Transportation needs:    Medical:  Not on file    Non-medical: Not on file  Tobacco Use  . Smoking status: Former Smoker    Packs/day: 0.33    Years: 10.00    Pack years: 3.30    Types: Cigarettes    Last attempt to quit: 1961    Years since quitting: 58.6  . Smokeless tobacco: Never Used  Substance and Sexual Activity  . Alcohol use: No  . Drug use: No  . Sexual activity: Never  Lifestyle  . Physical activity:    Days per week: Not on file    Minutes per session: Not on file  . Stress: Not on file  Relationships  . Social connections:    Talks on phone: Not on file    Gets together: Not on file    Attends religious service: Not on file    Active member of club or organization: Not on file    Attends meetings of clubs or organizations: Not on file    Relationship status: Not on file  Other Topics Concern  . Not on file  Social History Narrative   Married father of one.  Previously uses stationary bike routinely.  Now reduced due to other social stressors.   Quit smoking 50 years ago.  Does not drink alcohol     Family History: The patient's family history includes Heart Problems (age of onset: 70) in his father.  ROS:   Please see the history of present illness.     All other systems reviewed and are negative.  EKGs/Labs/Other Studies Reviewed:    The following studies were reviewed today:  2D echo 08/28/2017 Study Conclusions  - Left ventricle: The cavity size was normal. Wall thickness was increased in a pattern of mild LVH. Systolic function was normal. The estimated ejection fraction was in the range of 60% to 65%. Wall motion was normal; there were no regional wall motion abnormalities. Doppler parameters are consistent with abnormal left ventricular relaxation (grade 1 diastolic dysfunction). Doppler parameters are consistent with indeterminate ventricular filling pressure. - Aortic valve: Transvalvular velocity was within the  normal range. There was no stenosis. There was no regurgitation. - Aorta: Ascending aortic diameter: 43 mm (S). - Ascending aorta: The ascending aorta was mildly dilated. - Mitral valve: Transvalvular velocity was within the normal range. There was no evidence for stenosis. There was no regurgitation. - Left atrium: The atrium was moderately dilated. - Right ventricle: The cavity size was normal. Wall thickness was normal. Systolic function was normal. - Atrial septum: No defect or patent foramen ovale was identified by color flow Doppler. - Tricuspid valve: There was trivial regurgitation. - Pulmonary arteries: Systolic pressure was within the normal range. PA peak pressure: 21 mm Hg (S).  Cardiac cath 08/27/2017 Conclusions: 1. Severe 3-vessel coronary artery disease, as detailed below. Mid RCA is now occluded. Otherwise, there has been no significant change since 03/2016. 2. Widely patent LIMA-LAD. 3. Patent SVG-rPDA. Patent SVG overlapping stents have minimal in-stent restenosis. 4. Inability to cross aortic valve from the left radial. 5. Systemic hypertension  Recommendations: 1. Escalate medical therapy; will increase isosorbide mononitrate and hydralazine. 2. Obtain echocardiogram to assess LVEF. 3. Admit for overnight extended recovery, as the patient does not have a caregiver to stay with him overnight.   EKG:  EKG is not ordered today.   Recent Labs: 12/13/2017: B Natriuretic Peptide 190.3; TSH 0.108 12/18/2017: ALT 10 12/22/2017: BUN 41; Creatinine, Ser 1.51; Hemoglobin 8.4; Platelets 251; Potassium 3.7; Sodium 136  Recent  Lipid Panel    Component Value Date/Time   CHOL 134 01/11/2017 0958   TRIG 83 01/11/2017 0958   HDL 40 01/11/2017 0958   CHOLHDL 3.4 01/11/2017 0958   CHOLHDL 3.2 03/26/2016 0531   VLDL 16 03/26/2016 0531   LDLCALC 77 01/11/2017 0958    Physical Exam:    VS:  BP (!) 162/62   Pulse (!) 57   Ht 6\' 1"  (1.854 m)   Wt 217 lb 3.2 oz  (98.5 kg)   BMI 28.66 kg/m     Wt Readings from Last 3 Encounters:  01/11/18 217 lb 3.2 oz (98.5 kg)  12/22/17 220 lb 7.4 oz (100 kg)  12/16/17 217 lb 12.8 oz (98.8 kg)     GEN: Well nourished, well developed in no acute distress HEENT: Normal NECK: No JVD; No carotid bruits LYMPHATICS: No lymphadenopathy CARDIAC: RRR, no murmurs, rubs, gallops RESPIRATORY:  Clear to auscultation without rales, wheezing or rhonchi  ABDOMEN: Soft, non-tender, non-distended MUSCULOSKELETAL:  Trace  edema; No deformity  SKIN: Warm and dry NEUROLOGIC:  Alert and oriented x 3 PSYCHIATRIC:  Normal affect   ASSESSMENT:    1. Coronary artery disease involving native coronary artery of native heart with angina pectoris ([place])   2. History of: Non-STEMI (non-ST elevated myocardial infarction)   3. Essential hypertension    PLAN:    In order of problems listed above:  Coronary artery disease involving native coronary artery of native heart with angina pectoris ([place]) History of: Non-STEMI (non-ST elevated myocardial infarction), History of STEMI He has a complex cardiac history is doing well on his current medication regimen that includes aspirin, Plavix, Coreg, amlodipine, furosemide, hydralazine, Imdur, losartan, HCTZ.  He denies anginal symptoms.  Essential hypertension His pressure is mildly elevated today.  He is taking all of his cardiac medications today.  He does not wish to change any of his medication regimen.  He is on reasonable doses of all medications at this time with little room to titrate.  I will make no medication changes.  Follow-up with Dr. Ellyn Hack in December as planned.  Medication Adjustments/Labs and Tests Ordered: Current medicines are reviewed at length with the patient today.  Concerns regarding medicines are outlined above.  No orders of the defined types were placed in this encounter.  No orders of the defined types were placed in this encounter.   Signed, Ledora Bottcher, [place]  01/11/2018 8:42 AM    [facility]

## 2018-01-11 ENCOUNTER — Ambulatory Visit (INDEPENDENT_AMBULATORY_CARE_PROVIDER_SITE_OTHER): Payer: Medicare Other | Admitting: Physician Assistant

## 2018-01-11 ENCOUNTER — Encounter: Payer: Self-pay | Admitting: Physician Assistant

## 2018-01-11 VITALS — BP 162/62 | HR 57 | Ht 73.0 in | Wt 217.2 lb

## 2018-01-11 DIAGNOSIS — I25119 Atherosclerotic heart disease of native coronary artery with unspecified angina pectoris: Secondary | ICD-10-CM

## 2018-01-11 DIAGNOSIS — I214 Non-ST elevation (NSTEMI) myocardial infarction: Secondary | ICD-10-CM | POA: Diagnosis not present

## 2018-01-11 DIAGNOSIS — I10 Essential (primary) hypertension: Secondary | ICD-10-CM

## 2018-01-11 NOTE — Patient Instructions (Signed)
Medication Instructions:  NO CHANGES- Your physician recommends that you continue on your current medications as directed. Please refer to the Current Medication list given to you today.  If you need a refill on your cardiac medications before your next appointment, please call your pharmacy.  Follow-Up: Your physician wants you to follow-up in: [place].   Thank you for choosing [facility] at [facility]!!

## 2018-01-28 ENCOUNTER — Observation Stay (HOSPITAL_COMMUNITY)
Admission: EM | Admit: 2018-01-28 | Discharge: 2018-01-28 | Disposition: A | Discharge status: Home / Self Care | Payer: Medicare Other | Attending: Internal Medicine | Admitting: Internal Medicine

## 2018-01-28 ENCOUNTER — Emergency Department (HOSPITAL_COMMUNITY): Payer: Medicare Other

## 2018-01-28 ENCOUNTER — Other Ambulatory Visit: Payer: Self-pay

## 2018-01-28 ENCOUNTER — Encounter (HOSPITAL_COMMUNITY): Payer: Self-pay | Admitting: Emergency Medicine

## 2018-01-28 DIAGNOSIS — Z7982 Long term (current) use of aspirin: Secondary | ICD-10-CM | POA: Insufficient documentation

## 2018-01-28 DIAGNOSIS — I129 Hypertensive chronic kidney disease with stage 1 through stage 4 chronic kidney disease, or unspecified chronic kidney disease: Secondary | ICD-10-CM | POA: Diagnosis not present

## 2018-01-28 DIAGNOSIS — R0789 Other chest pain: Secondary | ICD-10-CM | POA: Diagnosis not present

## 2018-01-28 DIAGNOSIS — Z7984 Long term (current) use of oral hypoglycemic drugs: Secondary | ICD-10-CM | POA: Diagnosis not present

## 2018-01-28 DIAGNOSIS — E119 Type 2 diabetes mellitus without complications: Secondary | ICD-10-CM | POA: Diagnosis present

## 2018-01-28 DIAGNOSIS — E1122 Type 2 diabetes mellitus with diabetic chronic kidney disease: Secondary | ICD-10-CM | POA: Insufficient documentation

## 2018-01-28 DIAGNOSIS — I252 Old myocardial infarction: Secondary | ICD-10-CM | POA: Insufficient documentation

## 2018-01-28 DIAGNOSIS — Z951 Presence of aortocoronary bypass graft: Secondary | ICD-10-CM | POA: Diagnosis not present

## 2018-01-28 DIAGNOSIS — I714 Abdominal aortic aneurysm, without rupture: Secondary | ICD-10-CM | POA: Insufficient documentation

## 2018-01-28 DIAGNOSIS — R0902 Hypoxemia: Secondary | ICD-10-CM | POA: Diagnosis not present

## 2018-01-28 DIAGNOSIS — E785 Hyperlipidemia, unspecified: Secondary | ICD-10-CM | POA: Diagnosis not present

## 2018-01-28 DIAGNOSIS — I10 Essential (primary) hypertension: Secondary | ICD-10-CM

## 2018-01-28 DIAGNOSIS — D631 Anemia in chronic kidney disease: Secondary | ICD-10-CM | POA: Diagnosis not present

## 2018-01-28 DIAGNOSIS — E1169 Type 2 diabetes mellitus with other specified complication: Secondary | ICD-10-CM | POA: Diagnosis present

## 2018-01-28 DIAGNOSIS — E669 Obesity, unspecified: Secondary | ICD-10-CM | POA: Insufficient documentation

## 2018-01-28 DIAGNOSIS — I1A0 Resistant hypertension: Secondary | ICD-10-CM | POA: Diagnosis present

## 2018-01-28 DIAGNOSIS — N183 Chronic kidney disease, stage 3 unspecified: Secondary | ICD-10-CM | POA: Diagnosis present

## 2018-01-28 DIAGNOSIS — Z8673 Personal history of transient ischemic attack (TIA), and cerebral infarction without residual deficits: Secondary | ICD-10-CM | POA: Insufficient documentation

## 2018-01-28 DIAGNOSIS — Z7902 Long term (current) use of antithrombotics/antiplatelets: Secondary | ICD-10-CM | POA: Insufficient documentation

## 2018-01-28 DIAGNOSIS — Z79899 Other long term (current) drug therapy: Secondary | ICD-10-CM | POA: Diagnosis not present

## 2018-01-28 DIAGNOSIS — R079 Chest pain, unspecified: Principal | ICD-10-CM | POA: Diagnosis present

## 2018-01-28 DIAGNOSIS — G4730 Sleep apnea, unspecified: Secondary | ICD-10-CM | POA: Diagnosis present

## 2018-01-28 DIAGNOSIS — Z95820 Peripheral vascular angioplasty status with implants and grafts: Secondary | ICD-10-CM | POA: Diagnosis not present

## 2018-01-28 DIAGNOSIS — Z87891 Personal history of nicotine dependence: Secondary | ICD-10-CM | POA: Diagnosis not present

## 2018-01-28 LAB — GLUCOSE, CAPILLARY
GLUCOSE-CAPILLARY: 156 mg/dL — AB (ref 70–99)
GLUCOSE-CAPILLARY: 148 mg/dL — AB (ref 70–99)
Glucose-Capillary: 173 mg/dL — ABNORMAL HIGH (ref 70–99)

## 2018-01-28 LAB — CREATININE, SERUM
Creatinine, Ser: 1.37 mg/dL — ABNORMAL HIGH (ref 0.61–1.24)
GFR calc Af Amer: 53 mL/min — ABNORMAL LOW (ref >60)
GFR, EST NON AFRICAN AMERICAN: 46 mL/min — AB (ref >60)

## 2018-01-28 LAB — CBC
HCT: 31.2 % — ABNORMAL LOW (ref 39.0–52.0)
HEMATOCRIT: 27.7 % — AB (ref 39.0–52.0)
HEMOGLOBIN: 9.9 g/dL — AB (ref 13.0–17.0)
Hemoglobin: 8.8 g/dL — ABNORMAL LOW (ref 13.0–17.0)
MCH: 28.9 pg (ref 26.0–34.0)
MCH: 28.5 pg (ref 26.0–34.0)
MCHC: 31.8 g/dL (ref 30.0–36.0)
MCHC: 31.7 g/dL (ref 30.0–36.0)
MCV: 90.8 fL (ref 78.0–100.0)
MCV: 89.9 fL (ref 78.0–100.0)
Platelets: 280 10*3/uL (ref 150–400)
Platelets: 326 10*3/uL (ref 150–400)
RBC: 3.05 MIL/uL — AB (ref 4.22–5.81)
RBC: 3.47 MIL/uL — ABNORMAL LOW (ref 4.22–5.81)
RDW: 16.1 % — ABNORMAL HIGH (ref 11.5–15.5)
RDW: 16.1 % — ABNORMAL HIGH (ref 11.5–15.5)
WBC: 7.4 10*3/uL (ref 4.0–10.5)
WBC: 8.1 10*3/uL (ref 4.0–10.5)

## 2018-01-28 LAB — BASIC METABOLIC PANEL
Anion gap: 9 (ref 5–15)
BUN: 25 mg/dL — AB (ref 8–23)
CHLORIDE: 109 mmol/L (ref 98–111)
CO2: 21 mmol/L — AB (ref 22–32)
Calcium: 7.8 mg/dL — ABNORMAL LOW (ref 8.9–10.3)
Creatinine, Ser: 1.2 mg/dL (ref 0.61–1.24)
GFR calc non Af Amer: 54 mL/min — ABNORMAL LOW (ref >60)
Glucose, Bld: 221 mg/dL — ABNORMAL HIGH (ref 70–99)
POTASSIUM: 4.2 mmol/L (ref 3.5–5.1)
Sodium: 139 mmol/L (ref 135–145)

## 2018-01-28 LAB — TROPONIN I
Troponin I: <0.03 ng/mL (ref <0.03)
Troponin I: <0.03 ng/mL (ref <0.03)

## 2018-01-28 LAB — I-STAT TROPONIN, ED: Troponin i, poc: 0.03 ng/mL (ref 0.00–0.08)

## 2018-01-28 MED ORDER — INSULIN ASPART 100 UNIT/ML ~~LOC~~ SOLN
0.0000 [IU] | Freq: Three times a day (TID) | SUBCUTANEOUS | Status: DC
  Administered 2018-01-28: 2 [IU] via SUBCUTANEOUS
  Administered 2018-01-28: 1 [IU] via SUBCUTANEOUS
  Filled 2018-01-28 (×2): qty 1

## 2018-01-28 MED ORDER — OXYBUTYNIN CHLORIDE 5 MG PO TABS: 5.0000 mg | ORAL_TABLET | Freq: Every day | ORAL | Status: DC | PRN

## 2018-01-28 MED ORDER — ISOSORBIDE MONONITRATE ER 30 MG PO TB24: 60.0000 mg | ORAL_TABLET | Freq: Every evening | ORAL | Status: DC

## 2018-01-28 MED ORDER — LOSARTAN POTASSIUM 50 MG PO TABS
100.0000 mg | ORAL_TABLET | Freq: Every day | ORAL | Status: DC
  Administered 2018-01-28: 100 mg via ORAL
  Filled 2018-01-28: qty 2

## 2018-01-28 MED ORDER — LOSARTAN POTASSIUM-HCTZ 100-25 MG PO TABS: 1.0000 | ORAL_TABLET | Freq: Every evening | ORAL | Status: DC

## 2018-01-28 MED ORDER — NITROGLYCERIN 0.4 MG SL SUBL: 0.4000 mg | SUBLINGUAL_TABLET | SUBLINGUAL | Status: DC | PRN

## 2018-01-28 MED ORDER — HYDRALAZINE HCL 50 MG PO TABS
100.0000 mg | ORAL_TABLET | Freq: Three times a day (TID) | ORAL | Status: DC
  Administered 2018-01-28 (×2): 100 mg via ORAL
  Filled 2018-01-28 (×2): qty 2

## 2018-01-28 MED ORDER — ENOXAPARIN SODIUM 40 MG/0.4ML ~~LOC~~ SOLN
40.0000 mg | Freq: Every day | SUBCUTANEOUS | Status: DC
  Administered 2018-01-28: 40 mg via SUBCUTANEOUS
  Filled 2018-01-28: qty 0.4

## 2018-01-28 MED ORDER — ISOSORBIDE MONONITRATE ER 60 MG PO TB24: 60.0000 mg | ORAL_TABLET | Freq: Two times a day (BID) | ORAL | 0 refills | Status: DC

## 2018-01-28 MED ORDER — ACETAMINOPHEN 325 MG PO TABS: 650.0000 mg | ORAL_TABLET | ORAL | Status: DC | PRN

## 2018-01-28 MED ORDER — CLOPIDOGREL BISULFATE 75 MG PO TABS
75.0000 mg | ORAL_TABLET | Freq: Every morning | ORAL | Status: DC
  Administered 2018-01-28: 75 mg via ORAL
  Filled 2018-01-28: qty 1

## 2018-01-28 MED ORDER — ASPIRIN 81 MG PO CHEW
81.0000 mg | CHEWABLE_TABLET | Freq: Every morning | ORAL | Status: DC
  Administered 2018-01-28: 81 mg via ORAL
  Filled 2018-01-28: qty 1

## 2018-01-28 MED ORDER — ISOSORBIDE MONONITRATE ER 60 MG PO TB24
60.0000 mg | ORAL_TABLET | Freq: Two times a day (BID) | ORAL | Status: DC
  Administered 2018-01-28: 60 mg via ORAL
  Filled 2018-01-28: qty 1

## 2018-01-28 MED ORDER — ONDANSETRON HCL 4 MG/2ML IJ SOLN: 4.0000 mg | Freq: Four times a day (QID) | INTRAMUSCULAR | Status: DC | PRN

## 2018-01-28 MED ORDER — AMLODIPINE BESYLATE 5 MG PO TABS
10.0000 mg | ORAL_TABLET | Freq: Every evening | ORAL | Status: DC
  Administered 2018-01-28: 10 mg via ORAL
  Filled 2018-01-28: qty 2

## 2018-01-28 MED ORDER — HYDROCHLOROTHIAZIDE 25 MG PO TABS
25.0000 mg | ORAL_TABLET | Freq: Every day | ORAL | Status: DC
  Administered 2018-01-28: 25 mg via ORAL
  Filled 2018-01-28: qty 1

## 2018-01-28 MED ORDER — CARVEDILOL 12.5 MG PO TABS
12.5000 mg | ORAL_TABLET | Freq: Two times a day (BID) | ORAL | Status: DC
  Administered 2018-01-28: 12.5 mg via ORAL
  Filled 2018-01-28: qty 1

## 2018-01-28 NOTE — ED Notes (Signed)
ED Provider at bedside. 

## 2018-01-28 NOTE — Consult Note (Signed)
Cardiology Consultation:   Patient ID: [name] MRN: [id number]; DOB: Jul 07, 1930  Admit date: 01/28/2018 Date of Consult: 01/28/2018  Primary Care Provider: Suzan Garibaldi, [place] Primary Cardiologist: Glenetta Hew, MD   Patient Profile:   [name] is a 84 y.o. male with a hx of CAD S/P CABG 2012 and multiple stenting, last cath 09/04/2017, DM type II, CKD, anemia, hypertension, CVA with 90% left MCA stenosis followed by Dr. Kathee Delton, upper lipidemia who is being seen today for the evaluation of chest pain at the request of Dr. Eliseo Squires. He also has a history of diastolic dysfunction with normal LV function by echo 03/2016, dilated aortic root at 46 mm and 45 mm a sending aorta, postoperative A. fib with no recurrence.   History of Present Illness:   [name] recently admitted in early August for pyelonephritis.  He developed chest pain during that admission with a mildly elevated troponin of 0.05. This was felt to be related to poorly controlled blood pressure and no further cardiac work-up was done.  He had just been evaluated for chest pain in April of this year with a cardiac cath on 08/27/2017 showing widely patent LIMA to LAD, patent SVG to RPDA, patent SVG overlapping stents with minimal in-stent restenosis. (Stent to SVG to PDA placed in 03/2016). He was continued on medical management.  [name] in yesterday for complaints of chest pain.  He had mowed his yard yesterday afternoon that took him a little over an hour.  He came in and cooks supper.  After supper while sitting down watching the news he developed some mild dull chest pain, left chest-center.  He had no associated shortness of breath, nausea, diaphoresis, lightheadedness or syncope.  He considered that it may be indigestion and he tried drinking a lot of water.  He noted his blood pressure was up ~210/100.  His discomfort continued throughout the evening and at about midnight his wife decided to call 911.  He was  given aspirin in the ambulance and his pain resolved.  Is currently pain-free.  Past Medical History:  Diagnosis Date  . Abdominal aortic aneurysm ([place])    a. Aortic duplex 06/2014: mild aneurysmal dilatation of proximal abdominal aorta measuring 3.4x3.4cm. No sig change from 2012. F/u due 06/2016;   . AKI (acute kidney injury) ([place]) 03/28/2016  . Arthritis    "right knee; never bothered me" (03/26/2016)  . 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 CAD in native artery    a. NSTEMI 11/2010 - CABG x2(LIMA to LAD, SVG to PDA). b. NEG Lexi MV 10/24/13, EF 53%, no perfusion abnormality, septal and apical HK noted. c. NSTEMI 05/2014 - s/p DES to SVG-RPDA 06/18/14 (Xience Alpine DES 3.0 x 18 mm -3.35 mm), EF 60-65; d. 03/2016 STEMI/PCI: LM nl, ost LAD 70%, mLCx 50%, pRCA 95% - mRCA 60%, dRPDA 70%, LIMA->LAD ok, o-p SVG->RPDA 100% (3.0x16 Promus DES overlapping prior stent).  . Diastolic dysfunction    a. 03/2016 Echo: EF 60-65%, no rwma, Gr1 DD, triv AI, Ao root 58mm, Asc Ao 38mm, triv MR.  . Dizziness    a. Carotid duplex 03/2014: mild fibrous plaque, no significant stenosis.  . Foley catheter in place    "been wearing it for a couple months now" (03/26/2016)  . HTN (hypertension)   . Hyperlipidemia LDL goal <70   . Non-STEMI (non-ST elevated myocardial infarction) ([place]) 11/2010   1. Ostial LAD 70% (to close to  LM for PCI), subtotal occlusion of the RCA  . Postoperative atrial fibrillation ([place]) 11/2010   Post CABG, no sign recurrence  . Refusal of blood transfusions as patient is Jehovah's Witness   . 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 Servando Snare)  . Sleep apnea    Not on CPAP. (03/26/2016)  . ST elevation myocardial infarction (STEMI) of inferior wall ([place]) 04/05/2016   Occluded in-stent restenosis/thrombosis of SVG-RCA --> treated with overlapping Promus DES 3.0 mm 16 mm postdilated 3.0 mm).  . Stroke [facility]) 2014; 01/2015   a. 2014 with mild right  hand weakness, nonhemorrhagic per pt.;; b. - PTA-Stent L ICA 95%   . Type II diabetes mellitus ([place])     Past Surgical History:  Procedure Laterality Date  . APPENDECTOMY    . CARDIAC CATHETERIZATION  12/11/2010   Dr. Chase Picket - subsequent cath - normal LV systolic function, no renal artery stenosis, severe 2-vessel disease with subtotaled RCA prox and distal 60% lesion and complex 70% area of narrowing of ostium of LAD  . CARDIAC CATHETERIZATION N/A 03/26/2016   Procedure: Left Heart Cath and Coronary Angiography;  Surgeon: Nelva Bush, MD;  Location: [place] CV LAB;  Service: Cardiovascular;  Laterality: N/A;  . CARDIAC CATHETERIZATION N/A 03/26/2016   Procedure: Coronary Stent Intervention;  Surgeon: Nelva Bush, MD;  Location: [place] CV LAB;  Service: Cardiovascular: 100% In-stent thrombosis of pros SVG-RCA (Xience DES) --> treated with PromusDES 3.0 x 18 (3.3 mm)  . CARDIAC CATHETERIZATION N/A 03/26/2016   Procedure: Bypass Graft Angiography;  Surgeon: Nelva Bush, MD;  Location: [place] CV LAB;  Service: Cardiovascular;  Laterality: N/A;  . CORONARY ANGIOPLASTY WITH STENT PLACEMENT  06/18/2014   PCI to SVG-RPDA 06/18/14 (Xience Alpine DES 3.0 x 18 mm -3.35 mm),   . CORONARY ARTERY BYPASS GRAFT  12/15/2010   X2, Dr Servando Snare; LIMA to LAD, SVG to PDA;   . CORONARY/GRAFT ANGIOGRAPHY N/A 08/27/2017   Procedure: CORONARY/GRAFT ANGIOGRAPHY;  Surgeon: Nelva Bush, MD;  Location: [place] CV LAB;  Service: Cardiovascular;; pLAD 70%, ostD1 50%.  mCx 60%, OM2 80%. pRCA 95% & mRCA 100% - rPDA 70%. LIMA-mLAD patent. SVG-rPDA 10% ISR.   Marland Kitchen CYSTOSCOPY WITH URETHRAL DILATATION    . IR ANGIO INTRA EXTRACRAN SEL COM CAROTID INNOMINATE BILAT MOD SED  02/25/2017  . IR ANGIO INTRA EXTRACRAN SEL COM CAROTID INNOMINATE BILAT MOD SED  10/19/2017   Dr. Kathee Delton: L Common Carotid - ECA & major branches widely patent. ICA ~20% distal to bulb & 50% in supraclinoid segment. LMCA-distal  1/3 MI ~905 stenosis with post-stenotic dilation into inferior division. ~50% prox Basilar A stenosis @ anterior Inf Cerebellar A. 50% R ICA  . IR ANGIO VERTEBRAL SEL SUBCLAVIAN INNOMINATE UNI L MOD SED  02/25/2017  . IR ANGIO VERTEBRAL SEL SUBCLAVIAN INNOMINATE UNI R MOD SED  10/19/2017  . IR ANGIO VERTEBRAL SEL VERTEBRAL UNI L MOD SED  10/19/2017  . IR GENERIC HISTORICAL  01/21/2016   IR RADIOLOGIST EVAL & MGMT 01/21/2016 [facility]INTER[facility] RAD  . IR GENERIC HISTORICAL  02/03/2016   IR CATHETER TUBE CHANGE 02/03/2016 Marybelle Killings, MD [facility]-INTERV RAD  . IR RADIOLOGIST EVAL & MGMT  11/09/2017  . LEFT HEART CATHETERIZATION WITH CORONARY ANGIOGRAM N/A 06/18/2014   Procedure: LEFT HEART CATHETERIZATION WITH CORONARY ANGIOGRAM;  Surgeon: Leonie Man, MD;  Location[facility] C[facility] LAB;  Service: Cardiovascular;  -- severe disease of SVG-rPDA  . RADIOLOGY WITH ANESTHESIA  N/A 01/24/2015   Procedure: STENT ASSISTED ANGIOPLASTY (RADIOLOGY WITH ANESTHESIA);  Surgeon: Luanne Bras, MD;  Location: [place];  Service: Radiology;  Laterality: N/A;  . TONSILLECTOMY    . TRANSTHORACIC ECHOCARDIOGRAM  3/'14; 11/'16:11/'17   a. EF 55-60%, severe Conc LVH, Nl Systolic fxn, G1 DD,  Ao Sclerosis w/o stenosis; CRO PFO;; b. Moderate concentric LVH. EF 60-65%. No RWMA, Gr 1 DD. Mild LA dilation;; c) HK of the distal inferior wall. EF 60-65%.  GR 1 DD. Ascending aorta/root 45-46 mm.  . TRANSTHORACIC ECHOCARDIOGRAM  08/2017    EF 60-65%. Mild LVH. No RWMA. Gr 1 DD. Mod LA dilation.      Home Medications:  Prior to Admission medications   Medication Sig Start Date End Date Taking? Authorizing Provider  amLODipine (NORVASC) 10 MG tablet Take 10 mg by mouth every evening.    Yes [provider]  aspirin 81 MG chewable tablet Chew 1 tablet (81 mg total) by mouth daily. Patient taking differently: Chew 81 mg by mouth every morning.  03/29/16  Yes Isaiah Serge, NP  carvedilol (COREG) 12.5 MG tablet Take 1 tablet (12.5 mg total) by mouth  2 (two) times daily with a meal. 12/22/17  Yes Asencion Noble, MD  clopidogrel (PLAVIX) 75 MG tablet Take 1 tablet (75 mg total) by mouth daily. Patient taking differently: Take 75 mg by mouth every morning.  09/22/17  Yes Croitoru, Mihai, MD  furosemide (LASIX) 20 MG tablet Take 1 tablet (20 mg total) by mouth as needed. Patient taking differently: Take 20 mg by mouth daily as needed (for fluid retention or swelling).  09/23/17 01/28/18 Yes Almyra Deforest, PA  glimepiride (AMARYL) 4 MG tablet Take 4 mg by mouth 2 (two) times daily with a meal.    Yes [provider]  hydrALAZINE (APRESOLINE) 100 MG tablet Take 1 tablet (100 mg total) by mouth 3 (three) times daily. 12/16/17  Yes Leonie Man, MD  isosorbide mononitrate (IMDUR) 60 MG 24 hr tablet Take 1 tablet (60 mg total) by mouth every evening. 12/22/17  Yes Asencion Noble, MD  losartan-hydrochlorothiazide (HYZAAR) 100-25 MG tablet Take 1 tablet by mouth every evening. 12/16/17  Yes Leonie Man, MD  metFORMIN (GLUCOPHAGE) 1000 MG tablet Take 1 tablet (1,000 mg total) by mouth 2 (two) times daily. 03/29/16  Yes Isaiah Serge, NP  nitroGLYCERIN (NITROSTAT) 0.4 MG SL tablet Place 1 tablet (0.4 mg total) under the tongue every 5 (five) minutes as needed for chest pain (up to 3 doses). 08/25/17  Yes Almyra Deforest, PA  oxybutynin (DITROPAN) 5 MG tablet Take 5 mg by mouth daily as needed for bladder spasms.  09/07/17  Yes [provider]  isosorbide mononitrate (IMDUR) 60 MG 24 hr tablet Take 1 tablet (60 mg total) by mouth daily. Patient not taking: Reported on 01/28/2018 08/28/17   Erma Heritage, PA-C    Inpatient Medications: Scheduled Meds: . amLODipine  10 mg Oral QPM  . aspirin  81 mg Oral q morning - 10a  . carvedilol  12.5 mg Oral BID WC  . clopidogrel  75 mg Oral q morning - 10a  . enoxaparin (LOVENOX) injection  40 mg Subcutaneous Daily  . hydrALAZINE  100 mg Oral Q8H  . losartan  100 mg Oral Daily   And  .  hydrochlorothiazide  25 mg Oral Daily  . insulin aspart  0-9 Units Subcutaneous TID WC  . isosorbide mononitrate  60 mg Oral QPM  Continuous Infusions:  PRN Meds: acetaminophen, nitroGLYCERIN, ondansetron (ZOFRAN) IV, oxybutynin  Allergies:    Allergies  Allergen Reactions  . Other Other (See Comments)    No  BLOOD PRODUCTS - Pt is Jehovah's Witness  . Statins Other (See Comments)    Cause muscle aches  . Atorvastatin Other (See Comments)    Myalgias  . Crestor [Rosuvastatin] Other (See Comments)    Myalgias   . Pravastatin Other (See Comments)    Myalgias  . Simvastatin Other (See Comments)    Myalgias     Social History:   Social History   Socioeconomic History  . Marital status: Married    Spouse name: [name]  . Number of children: 1  . Years of education: Not on file  . Highest education level: Not on file  Occupational History    Employer: RETIRED  Social Needs  . Financial resource strain: Not very hard  . Food insecurity:    Worry: Never true    Inability: Never true  . Transportation needs:    Medical: No    Non-medical: No  Tobacco Use  . Smoking status: Former Smoker    Packs/day: 0.33    Years: 10.00    Pack years: 3.30    Types: Cigarettes    Last attempt to quit: 1961    Years since quitting: 58.7  . Smokeless tobacco: Never Used  Substance and Sexual Activity  . Alcohol use: No  . Drug use: No  . Sexual activity: Never  Lifestyle  . Physical activity:    Days per week: 6 days    Minutes per session: 30 min  . Stress: Not at all  Relationships  . Social connections:    Talks on phone: More than three times a week    Gets together: More than three times a week    Attends religious service: 1 to 4 times per year    Active member of club or organization: Yes    Attends meetings of clubs or organizations: 1 to 4 times per year    Relationship status: Married  . Intimate partner violence:    Fear of current or ex partner: No     Emotionally abused: No    Physically abused: No    Forced sexual activity: No  Other Topics Concern  . Not on file  Social History Narrative   Married father of one.  Previously uses stationary bike routinely.  Now reduced due to other social stressors.   Quit smoking 50 years ago.  Does not drink alcohol    Family History:    Family History  Problem Relation Age of Onset  . Heart Problems Father 37     ROS:  Please see the history of present illness.   All other ROS reviewed and negative.     Physical Exam/Data:   Vitals:   01/28/18 0300 01/28/18 0330 01/28/18 0421 01/28/18 0427  BP: (!) 154/66 (!) 177/87  (!) 191/88  Pulse: (!) 48 (!) 57  (!) 56  Resp: 15 (!) 22  18  Temp:    97.6 F (36.4 C)  TempSrc:    Oral  SpO2: 97% 99%  100%  Weight:   97.1 kg   Height:   6\' 1"  (1.854 m)     Intake/Output Summary (Last 24 hours) at 01/28/2018 0803 Last data filed at 01/28/2018 0433 Gross per 24 hour  Intake -  Output 400 ml  Net -400 ml   [employer]  01/28/18 0421  Weight: 97.1 kg   Body mass index is 28.23 kg/m.  General:  Well nourished, well developed, in no acute distress HEENT: normal Lymph: no adenopathy Neck: no JVD Endocrine:  No thryomegaly Vascular: No carotid bruits; FA pulses 2+ bilaterally without bruits  Cardiac:  normal S1, S2; RRR; no murmur  Lungs:  clear to auscultation bilaterally, no wheezing, rhonchi or rales  Abd: soft, nontender, no hepatomegaly  Ext: no edema Musculoskeletal:  No deformities, BUE and BLE strength normal and equal Skin: warm and dry  Neuro:  CNs 2-12 intact, no focal abnormalities noted Psych:  Normal affect   EKG:  The EKG was personally reviewed and demonstrates: Sinus rhythm at 61 bpm with no significant changes Telemetry:  Telemetry was personally reviewed and demonstrates: Sinus bradycardia in the 50s  Relevant CV Studies:  2D echo 08/28/2017 Study Conclusions  - Left ventricle: The cavity size was normal.  Wall thickness was increased in a pattern of mild LVH. Systolic function was normal. The estimated ejection fraction was in the range of 60% to 65%. Wall motion was normal; there were no regional wall motion abnormalities. Doppler parameters are consistent with abnormal left ventricular relaxation (grade 1 diastolic dysfunction). Doppler parameters are consistent with indeterminate ventricular filling pressure. - Aortic valve: Transvalvular velocity was within the normal range. There was no stenosis. There was no regurgitation. - Aorta: Ascending aortic diameter: 43 mm (S). - Ascending aorta: The ascending aorta was mildly dilated. - Mitral valve: Transvalvular velocity was within the normal range. There was no evidence for stenosis. There was no regurgitation. - Left atrium: The atrium was moderately dilated. - Right ventricle: The cavity size was normal. Wall thickness was normal. Systolic function was normal. - Atrial septum: No defect or patent foramen ovale was identified by color flow Doppler. - Tricuspid valve: There was trivial regurgitation. - Pulmonary arteries: Systolic pressure was within the normal range. PA peak pressure: 21 mm Hg (S).  Cardiac cath 08/27/2017 Conclusions: 1. Severe 3-vessel coronary artery disease, as detailed below. Mid RCA is now occluded. Otherwise, there has been no significant change since 03/2016. 2. Widely patent LIMA-LAD. 3. Patent SVG-rPDA. Patent SVG overlapping stents have minimal in-stent restenosis. 4. Inability to cross aortic valve from the left radial. 5. Systemic hypertension  Recommendations: 1. Escalate medical therapy; will increase isosorbide mononitrate and hydralazine. 2. Obtain echocardiogram to assess LVEF. 3. Admit for overnight extended recovery, as the patient does not have a caregiver to stay with him overnight.  LHC 03/26/2016 Conclusions: 1. Significant native coronary artery disease,  including 70% ostial LAD stenosis, 50% mid LCx lesion, and 99% proximal RCA disease with TIMI-1 flow (chronic per prior cath reports). 2. Patent LIMA to LAD. 3. Acutely occluded SVG to PDA within previously placed stent. 4. Successful PCI to proximal SVG to PDA in-stent restenosis/thrombosis with placement of a Promus Premier 3.0 x 16 mm drug eluting stent (dilated to 3.3 mm) with 0% residual stenosis and TIMI-3 flow. 5. Post-PCI, occlusion of the distal PDA was noted at the site of previous 70% stenosis.  IC verapamil was administered during the intervention to prevent no-reflow.  Tirofiban bolus was also administered.  Given that the patient was chest-pain free, further intervention was not performed.  Recommendations: 1. Transfer to cardiac ICU for post-STEMI care. 2. Transthoracic echocardiogram. 3. Dual antiplatelet therapy with ASA 81 mg daily and clopidogrel 75 mg daily for at least 12 months, ideally longer.   Laboratory Data:  Chemistry Recent Labs  Lab 01/28/18 0150 01/28/18 0523  NA 139  --   K 4.2  --   CL 109  --   CO2 21*  --   GLUCOSE 221*  --   BUN 25*  --   CREATININE 1.20 1.37*  CALCIUM 7.8*  --   GFRNONAA 54* 46*  GFRAA >60 53*  ANIONGAP 9  --     No results for input(s): PROT, ALBUMIN, AST, ALT, ALKPHOS, BILITOT in the last 168 hours. Hematology Recent Labs  Lab 01/28/18 0150 01/28/18 0523  WBC 7.4 8.1  RBC 3.05* 3.47*  HGB 8.8* 9.9*  HCT 27.7* 31.2*  MCV 90.8 89.9  MCH 28.9 28.5  MCHC 31.8 31.7  RDW 16.1* 16.1*  PLT 280 326   Cardiac Enzymes Recent Labs  Lab 01/28/18 0523  TROPONINI <0.03    Recent Labs  Lab 01/28/18 0200  TROPIPOC 0.03    BNPNo results for input(s): BNP, PROBNP in the last 168 hours.  DDimer No results for input(s): DDIMER in the last 168 hours.  Radiology/Studies:  Dg Chest 2 View  Result Date: 01/28/2018 CLINICAL DATA:  Chest pain possible mi EXAM: CHEST - 2 VIEW COMPARISON:  12/13/2017 FINDINGS: Borderline  enlargement of cardiac silhouette post CABG. Mediastinal contours and pulmonary vascularity normal. Bibasilar atelectasis. Lungs otherwise clear. No pleural effusion or pneumothorax. Osseous demineralization with LEFT glenohumeral degenerative changes. IMPRESSION: Minimal enlargement of cardiac silhouette post CABG with bibasilar atelectasis. Electronically Signed   By: Lavonia Dana M.D.   On: 01/28/2018 02:27    Assessment and Plan:   Chest pain -Patient with dull chest pain lasting for about 6 hours last night without any associated symptoms.  Blood pressure was greater than 200/100 at the time. -cardiac cath on 08/27/2017 showing widely patent LIMA to LAD, patent SVG to RPDA, patent SVG overlapping stents with minimal in-stent restenosis, unchanged from prior cath in 04/2017.  He was continued on medical management. -Echo May 2019 showed normal LV function with EF 60 to 65% with grade 1 diastolic dysfunction. -Troponins negative x2 so far -EKG without acute ischemic changes -His symptoms seem to be related to high blood pressure as he has had similar episodes in the past also related to high blood pressure.  We will try to work on blood pressure control. -No further cardiac evaluation at this time. -If his third troponin comes back negative he can be discharged home with follow-up in our office  CAD  -Hx CABG 2012, 08/27/2017 showing widely patent LIMA to LAD, patent SVG to RPDA, patent SVG overlapping stents with minimal in-stent restenosis. (Stent to SVG to PDA placed in 03/2016).  -continue on aspirin and Plavix, Imdur 60 mg, BB, statin intolerant  Hypertension -Losartan 100/HCTZ 25 mg mg daily, Amlodipine 10 mg daily Carvedilol 0.5 mg twice daily and hydralazine 100 mg 3 times daily -Blood pressure was quite elevated during his episode last night per his home reading.  Blood pressure continues to be mildly elevated here -Dr. Harrell Gave will look at his medications to make  adjustments. -The patient does have occasional dizziness at home mostly in the mornings, improves during the day.  Would not shoot for too low of a goal for blood pressure.  Hyperlipidemia -LDL goal <70 History of statin intolerance   For questions or updates, please contact [place] Please consult [email or web address] for contact info under     Signed, Daune Perch, NP  01/28/2018 8:03 AM

## 2018-01-28 NOTE — H&P (Signed)
History and Physical    [name]:[id number] DOB: Apr 07, 1931 DOA: 01/28/2018  PCP: Suzan Garibaldi, FNP  Patient coming from: Home.  Chief Complaint: Chest pain.  HPI: [name] is a 84 y.o. male with history of CAD status post CABG and stenting last cardiac cath in April 2019 was recently admitted for chest pain last month at that time troponin was mildly elevated cardiology manage conservatively and also has history of diabetes mellitus type 2 chronic kidney disease chronic anemia hypertension was brought to the ER the patient was complaining of chest pain.  Patient states last evening he mowed his lawn and had dinner around 6 PM following which she started developing some chest pain across the chest.  Started on aching nonradiating no associated shortness of breath productive cough fever no chills no nausea vomiting or abdominal pain.  Since pain persisted through the night EMS was called.  EMS gave patient 4 baby aspirin following which patient chest pain resolved.  ED Course: In the ER patient was chest pain-free cardiac markers negative EKG showed normal sinus rhythm.  Patient admitted for further observation.  Review of Systems: As per HPI, rest all negative.   Past Medical History:  Diagnosis Date  . Abdominal aortic aneurysm ([place])    a. Aortic duplex 06/2014: mild aneurysmal dilatation of proximal abdominal aorta measuring 3.4x3.4cm. No sig change from 2012. F/u due 06/2016;   . AKI (acute kidney injury) ([place]) 03/28/2016  . Arthritis    "right knee; never bothered me" (03/26/2016)  . 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 CAD in native artery    a. NSTEMI 11/2010 - CABG x2(LIMA to LAD, SVG to PDA). b. NEG Lexi MV 10/24/13, EF 53%, no perfusion abnormality, septal and apical HK noted. c. NSTEMI 05/2014 - s/p DES to SVG-RPDA 06/18/14 (Xience Alpine DES 3.0 x 18 mm -3.35 mm), EF 60-65; d. 03/2016 STEMI/PCI: LM nl, ost LAD 70%, mLCx 50%, pRCA  95% - mRCA 60%, dRPDA 70%, LIMA->LAD ok, o-p SVG->RPDA 100% (3.0x16 Promus DES overlapping prior stent).  . Diastolic dysfunction    a. 03/2016 Echo: EF 60-65%, no rwma, Gr1 DD, triv AI, Ao root 48mm, Asc Ao 65mm, triv MR.  . Dizziness    a. Carotid duplex 03/2014: mild fibrous plaque, no significant stenosis.  . Foley catheter in place    "been wearing it for a couple months now" (03/26/2016)  . HTN (hypertension)   . Hyperlipidemia LDL goal <70   . Non-STEMI (non-ST elevated myocardial infarction) ([place]) 11/2010   1. Ostial LAD 70% (to close to [facility] for PCI), subtotal occlusion of the RCA  . Postoperative atrial fibrillation ([place]) 11/2010   Post CABG, no sign recurrence  . Refusal of blood transfusions as patient is Jehovah's Witness   . 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 Servando Snare)  . Sleep apnea    Not on CPAP. (03/26/2016)  . ST elevation myocardial infarction (STEMI) of inferior wall ([place]) 04/05/2016   Occluded in-stent restenosis/thrombosis of SVG-RCA --> treated with overlapping Promus DES 3.0 mm 16 mm postdilated 3.0 mm).  . Stroke [facility]) 2014; 01/2015   a. 2014 with mild right hand weakness, nonhemorrhagic per pt.;; b. - PTA-Stent L ICA 95%   . Type II diabetes mellitus ([place])     Past Surgical History:  Procedure Laterality Date  . APPENDECTOMY    . CARDIAC CATHETERIZATION  12/11/2010  Dr. Chase Picket - subsequent cath - normal LV systolic function, no renal artery stenosis, severe 2-vessel disease with subtotaled RCA prox and distal 60% lesion and complex 70% area of narrowing of ostium of LAD  . CARDIAC CATHETERIZATION N/A 03/26/2016   Procedure: Left Heart Cath and Coronary Angiography;  Surgeon: Nelva Bush, MD;  Location: [place] CV LAB;  Service: Cardiovascular;  Laterality: N/A;  . CARDIAC CATHETERIZATION N/A 03/26/2016   Procedure: Coronary Stent Intervention;  Surgeon: Nelva Bush, MD;  Location: [place] CV LAB;  Service: Cardiovascular:  100% In-stent thrombosis of pros SVG-RCA (Xience DES) --> treated with PromusDES 3.0 x 18 (3.3 mm)  . CARDIAC CATHETERIZATION N/A 03/26/2016   Procedure: Bypass Graft Angiography;  Surgeon: Nelva Bush, MD;  Location: [place] CV LAB;  Service: Cardiovascular;  Laterality: N/A;  . CORONARY ANGIOPLASTY WITH STENT PLACEMENT  06/18/2014   PCI to SVG-RPDA 06/18/14 (Xience Alpine DES 3.0 x 18 mm -3.35 mm),   . CORONARY ARTERY BYPASS GRAFT  12/15/2010   X2, Dr Servando Snare; LIMA to LAD, SVG to PDA;   . CORONARY/GRAFT ANGIOGRAPHY N/A 08/27/2017   Procedure: CORONARY/GRAFT ANGIOGRAPHY;  Surgeon: Nelva Bush, MD;  Location: [place] CV LAB;  Service: Cardiovascular;; pLAD 70%, ostD1 50%.  mCx 60%, OM2 80%. pRCA 95% & mRCA 100% - rPDA 70%. LIMA-mLAD patent. SVG-rPDA 10% ISR.   Marland Kitchen CYSTOSCOPY WITH URETHRAL DILATATION    . IR ANGIO INTRA EXTRACRAN SEL COM CAROTID INNOMINATE BILAT MOD SED  02/25/2017  . IR ANGIO INTRA EXTRACRAN SEL COM CAROTID INNOMINATE BILAT MOD SED  10/19/2017   Dr. Kathee Delton: L Common Carotid - ECA & major branches widely patent. ICA ~20% distal to bulb & 50% in supraclinoid segment. LMCA-distal 1/3 MI ~905 stenosis with post-stenotic dilation into inferior division. ~50% prox Basilar A stenosis @ anterior Inf Cerebellar A. 50% R ICA  . IR ANGIO VERTEBRAL SEL SUBCLAVIAN INNOMINATE UNI L MOD SED  02/25/2017  . IR ANGIO VERTEBRAL SEL SUBCLAVIAN INNOMINATE UNI R MOD SED  10/19/2017  . IR ANGIO VERTEBRAL SEL VERTEBRAL UNI L MOD SED  10/19/2017  . IR GENERIC HISTORICAL  01/21/2016   IR RADIOLOGIST EVAL & MGMT 01/21/2016 [facility]INTER[facility] RAD  . IR GENERIC HISTORICAL  02/03/2016   IR CATHETER TUBE CHANGE 02/03/2016 Marybelle Killings, MD [facility]-INTERV RAD  . IR RADIOLOGIST EVAL & MGMT  11/09/2017  . LEFT HEART CATHETERIZATION WITH CORONARY ANGIOGRAM N/A 06/18/2014   Procedure: LEFT HEART CATHETERIZATION WITH CORONARY ANGIOGRAM;  Surgeon: Leonie Man, MD;  Location[facility] C[facility];  Service: Cardiovascular;  -- severe  disease of SVG-rPDA  . RADIOLOGY WITH ANESTHESIA N/A 01/24/2015   Procedure: STENT ASSISTED ANGIOPLASTY (RADIOLOGY WITH ANESTHESIA);  Surgeon: Luanne Bras, MD;  Location: [place];  Service: Radiology;  Laterality: N/A;  . TONSILLECTOMY    . TRANSTHORACIC ECHOCARDIOGRAM  3/'14; 11/'16:11/'17   a. EF 55-60%, severe Conc LVH, Nl Systolic fxn, G1 DD,  Ao Sclerosis w/o stenosis; CRO PFO;; b. Moderate concentric LVH. EF 60-65%. No RWMA, Gr 1 DD. Mild LA dilation;; c) HK of the distal inferior wall. EF 60-65%.  GR 1 DD. Ascending aorta/root 45-46 mm.  . TRANSTHORACIC ECHOCARDIOGRAM  08/2017    EF 60-65%. Mild LVH. No RWMA. Gr 1 DD. Mod LA dilation.      reports that he quit smoking about 58 years ago. His smoking use included cigarettes. He has a 3.30 pack-year smoking history. He has never used smokeless tobacco. He reports that he does not drink  alcohol or use drugs.  Allergies  Allergen Reactions  . Other Other (See Comments)    No  BLOOD PRODUCTS - Pt is Jehovah's Witness  . Statins Other (See Comments)    Cause muscle aches  . Atorvastatin Other (See Comments)    Myalgias  . Crestor [Rosuvastatin] Other (See Comments)    Myalgias   . Pravastatin Other (See Comments)    Myalgias  . Simvastatin Other (See Comments)    Myalgias     Family History  Problem Relation Age of Onset  . Heart Problems Father 60    Prior to Admission medications   Medication Sig Start Date End Date Taking? Authorizing Provider  amLODipine (NORVASC) 10 MG tablet Take 10 mg by mouth every evening.    Yes [provider]  aspirin 81 MG chewable tablet Chew 1 tablet (81 mg total) by mouth daily. Patient taking differently: Chew 81 mg by mouth every morning.  03/29/16  Yes Isaiah Serge, NP  carvedilol (COREG) 12.5 MG tablet Take 1 tablet (12.5 mg total) by mouth 2 (two) times daily with a meal. 12/22/17  Yes Asencion Noble, MD  clopidogrel (PLAVIX) 75 MG tablet Take 1 tablet (75 mg total) by  mouth daily. Patient taking differently: Take 75 mg by mouth every morning.  09/22/17  Yes Croitoru, Mihai, MD  furosemide (LASIX) 20 MG tablet Take 1 tablet (20 mg total) by mouth as needed. Patient taking differently: Take 20 mg by mouth daily as needed (for fluid retention or swelling).  09/23/17 01/28/18 Yes Almyra Deforest, PA  glimepiride (AMARYL) 4 MG tablet Take 4 mg by mouth 2 (two) times daily with a meal.    Yes [provider]  hydrALAZINE (APRESOLINE) 100 MG tablet Take 1 tablet (100 mg total) by mouth 3 (three) times daily. 12/16/17  Yes Leonie Man, MD  isosorbide mononitrate (IMDUR) 60 MG 24 hr tablet Take 1 tablet (60 mg total) by mouth every evening. 12/22/17  Yes Asencion Noble, MD  losartan-hydrochlorothiazide (HYZAAR) 100-25 MG tablet Take 1 tablet by mouth every evening. 12/16/17  Yes Leonie Man, MD  metFORMIN (GLUCOPHAGE) 1000 MG tablet Take 1 tablet (1,000 mg total) by mouth 2 (two) times daily. 03/29/16  Yes Isaiah Serge, NP  nitroGLYCERIN (NITROSTAT) 0.4 MG SL tablet Place 1 tablet (0.4 mg total) under the tongue every 5 (five) minutes as needed for chest pain (up to 3 doses). 08/25/17  Yes Almyra Deforest, PA  oxybutynin (DITROPAN) 5 MG tablet Take 5 mg by mouth daily as needed for bladder spasms.  09/07/17  Yes [provider]  isosorbide mononitrate (IMDUR) 60 MG 24 hr tablet Take 1 tablet (60 mg total) by mouth daily. Patient not taking: Reported on 01/28/2018 08/28/17   Erma Heritage, PA-C    Physical Exam: Vitals:   01/28/18 0230 01/28/18 0245 01/28/18 0300 01/28/18 0330  BP: (!) 171/70 (!) 165/66 (!) 154/66 (!) 177/87  Pulse: (!) 55 (!) 48 (!) 48 (!) 57  Resp: 18 (!) 21 15 (!) 22  Temp:      TempSrc:      SpO2: 95% 96% 97% 99%      Constitutional: Moderately built and nourished. Vitals:   01/28/18 0230 01/28/18 0245 01/28/18 0300 01/28/18 0330  BP: (!) 171/70 (!) 165/66 (!) 154/66 (!) 177/87  Pulse: (!) 55 (!) 48 (!) 48 (!) 57  Resp: 18  (!) 21 15 (!) 22  Temp:  TempSrc:      SpO2: 95% 96% 97% 99%   Eyes: Anicteric no pallor. ENMT: No discharge from the ears eyes nose or mouth. Neck: No mass felt.  No neck rigidity.  No JVD appreciated. Respiratory: No rhonchi or crepitations. Cardiovascular: S1-S2 heard no murmurs appreciated. Abdomen: Soft nontender bowel sounds present. Musculoskeletal: No edema.  No joint effusion. Skin: No rash.  Skin appears warm. Neurologic: Alert awake oriented to time place and person.  Moves all extremities 5 x 5. Psychiatric: Appears normal.  Normal affect.   Labs on Admission: I have personally reviewed following labs and imaging studies  CBC: Recent Labs  Lab 01/28/18 0150  WBC 7.4  HGB 8.8*  HCT 27.7*  MCV 90.8  PLT 284   Basic Metabolic Panel: Recent Labs  Lab 01/28/18 0150  NA 139  K 4.2  CL 109  CO2 21*  GLUCOSE 221*  BUN 25*  CREATININE 1.20  CALCIUM 7.8*   GFR: CrCl cannot be calculated (Unknown ideal weight.). Liver Function Tests: No results for input(s): AST, ALT, ALKPHOS, BILITOT, PROT, ALBUMIN in the last 168 hours. No results for input(s): LIPASE, AMYLASE in the last 168 hours. No results for input(s): AMMONIA in the last 168 hours. Coagulation Profile: No results for input(s): INR, PROTIME in the last 168 hours. Cardiac Enzymes: No results for input(s): CKTOTAL, CKMB, CKMBINDEX, TROPONINI in the last 168 hours. BNP (last 3 results) No results for input(s): PROBNP in the last 8760 hours. HbA1C: No results for input(s): HGBA1C in the last 72 hours. CBG: No results for input(s): GLUCAP in the last 168 hours. Lipid Profile: No results for input(s): CHOL, HDL, LDLCALC, TRIG, CHOLHDL, LDLDIRECT in the last 72 hours. Thyroid Function Tests: No results for input(s): TSH, T4TOTAL, FREET4, T3FREE, THYROIDAB in the last 72 hours. Anemia Panel: No results for input(s): VITAMINB12, FOLATE, FERRITIN, TIBC, IRON, RETICCTPCT in the last 72 hours. Urine  analysis:    Component Value Date/Time   COLORURINE YELLOW 12/18/2017 1356   APPEARANCEUR CLEAR 12/18/2017 1356   LABSPEC 1.014 12/18/2017 1356   PHURINE 5.0 12/18/2017 1356   GLUCOSEU 50 (A) 12/18/2017 1356   HGBUR SMALL (A) 12/18/2017 1356   BILIRUBINUR NEGATIVE 12/18/2017 1356   KETONESUR NEGATIVE 12/18/2017 1356   PROTEINUR >=300 (A) 12/18/2017 1356   UROBILINOGEN 0.2 01/22/2015 1031   NITRITE POSITIVE (A) 12/18/2017 1356   LEUKOCYTESUR SMALL (A) 12/18/2017 1356   Sepsis Labs: @LABRCNTIP (procalcitonin:4,lacticidven:4) )No results found for this or any previous visit (from the past 240 hour(s)).   Radiological Exams on Admission: Dg Chest 2 View  Result Date: 01/28/2018 CLINICAL DATA:  Chest pain possible mi EXAM: CHEST - 2 VIEW COMPARISON:  12/13/2017 FINDINGS: Borderline enlargement of cardiac silhouette post CABG. Mediastinal contours and pulmonary vascularity normal. Bibasilar atelectasis. Lungs otherwise clear. No pleural effusion or pneumothorax. Osseous demineralization with LEFT glenohumeral degenerative changes. IMPRESSION: Minimal enlargement of cardiac silhouette post CABG with bibasilar atelectasis. Electronically Signed   By: Lavonia Dana M.D.   On: 01/28/2018 02:27    EKG: Independently reviewed.  Normal sinus rhythm.  Assessment/Plan Principal Problem:   Chest pain Active Problems:   Diabetes mellitus type 2 in obese (HCC)   Apnea, sleep   S/P CABG x 2   Essential hypertension   S/P angioplasty with stent 03/26/16 to VG to PDA for in-stent restenosis with DES.   Chronic renal insufficiency, stage III (moderate) (HCC)    1. Chest pain -with history of CAD status post CABG and  stenting we will cycle cardiac markers of coronary disease.  Has had last cardiac catheter in April 2019 and also had a recent admission last month at that time cardiology was seen conservative management.  We will continue patient's Coreg Plavix Imdur and aspirin.  Cardiology  notified. 2. Hypertension uncontrolled probably contributing to some chest pain. -I have ordered some of patient's nighttime antihypertensives.  We will continue home medications including Norvasc Coreg Lasix hydralazine losartan and hydrochlorothiazide.  Keep patient on PRN IV hydralazine for systolic blood pressure more than 160. 3. Chronic anemia -hemoglobin appears to be at baseline. 4. Diabetes mellitus type 2 -while inpatient we will keep patient on sliding scale coverage. 5. Chronic kidney disease stage II -creatinine appears to be at baseline.   DVT prophylaxis: Lovenox. Code Status: Full code. Family Communication: Discussed with patient. Disposition Plan: Home. Consults called: Cardiology. Admission status: Observation.   Rise Patience MD [facility] Pager [phone].  If 7PM-7AM, please contact night-coverage [email or web address] Password [email or web address]  01/28/2018, 4:10 AM

## 2018-01-28 NOTE — Progress Notes (Signed)
Pt discharged home with friend. Pt verbalized understanding of discharge paperwork including medication changes and follow-up appt.

## 2018-01-28 NOTE — Discharge Summary (Signed)
Physician Discharge Summary  [name] DTO:[id number] DOB: 08/21/1930 DOA: 01/28/2018  PCP: Suzan Garibaldi, FNP  Admit date: 01/28/2018 Discharge date: 01/28/2018  Admitted From: home Discharge disposition: home   Recommendations for Outpatient Follow-Up:   1. Monitor BP and adjust meds as able 2. BMP 1 week   Discharge Diagnosis:   Principal Problem:   Chest pain Active Problems:   Diabetes mellitus type 2 in obese (HCC)   Apnea, sleep   S/P CABG x 2   Essential hypertension   S/P angioplasty with stent 03/26/16 to VG to PDA for in-stent restenosis with DES.   Chronic renal insufficiency, stage III (moderate) (HCC)    Discharge Condition: Improved.  Diet recommendation: Low sodium, heart healthy.  Carbohydrate-modified.  Regular.  Wound care: None.  Code status: Full.   History of Present Illness:   [name] is a 84 y.o. male with history of CAD status post CABG and stenting last cardiac cath in April 2019 was recently admitted for chest pain last month at that time troponin was mildly elevated cardiology manage conservatively and also has history of diabetes mellitus type 2 chronic kidney disease chronic anemia hypertension was brought to the ER the patient was complaining of chest pain.  Patient states last evening he mowed his lawn and had dinner around 6 PM following which she started developing some chest pain across the chest.  Started on aching nonradiating no associated shortness of breath productive cough fever no chills no nausea vomiting or abdominal pain.  Since pain persisted through the night EMS was called.  EMS gave patient 4 baby aspirin following which patient chest pain resolved.   Hospital Course by Problem:  Chest pain due to uncontrolled HTN Per cards: I suspect his chest pain is most likely related to his elevated blood pressure, which has occurred in the past. Thankfully, the troponins are negative this time, whereas in the past  they have been elevated. He feels well now. He has a significant CAD treatment history, with a cath 08/2017.   His hypertension is difficult to control. He is already on amlodipine 10 mg, hydralazine 100 mg TID, losartan 100 mg, HCTZ 25 mg, carvedilol 12.5 mg BID, imdur 60 mg  Renal function up slightly today from baseline, K is 4.2. He is on max doses of all of these except carvedilol and imdur, and his heart rate of 53 limits uptitration of coreg. He is now 136/71 on his meds. He would need an additional medication, but there are issues. Spironolactone would likely affect kidneys/potassium. I am concerned about clonidine in him. He is controlled now, but it seems to be worse in the morning here and in the late afternoon at home. I would have him take imdur 60 mg BID and check his blood pressures. If he feels lightheaded or his BP drops to less than 983 systolic, he should return to imdur 60 mg once daily.  Chronic anemia -hemoglobin appears to be at baseline.  Diabetes mellitus type 2 -resume home meds  Chronic kidney disease stage II  -monitor as an outpatient   Medical Consultants:   cards   Discharge Exam:   Vitals:   01/28/18 1137 01/28/18 1401  BP: 136/71 (!) 150/75  Pulse: (!) 53 (!) 56  Resp: 17 19  Temp: 97.6 F (36.4 C)   SpO2: 95% 100%   Vitals:   01/28/18 0812 01/28/18 1109 01/28/18 1137 01/28/18 1401  BP: 136/69 (!) 146/68 136/71 Marland Kitchen)  150/75  Pulse: (!) 55 (!) 55 (!) 53 (!) 56  Resp: 17 19 17 19   Temp: 97.8 F (36.6 C) 98.5 F (36.9 C) 97.6 F (36.4 C)   TempSrc: Oral Oral Oral   SpO2: 99% 98% 95% 100%  Weight:      Height:        General exam: Appears calm and comfortable. -- ready for discharge   The results of significant diagnostics from this hospitalization (including imaging, microbiology, ancillary and laboratory) are listed below for reference.     Procedures and Diagnostic Studies:   Dg Chest 2 View  Result Date: 01/28/2018 CLINICAL DATA:   Chest pain possible mi EXAM: CHEST - 2 VIEW COMPARISON:  12/13/2017 FINDINGS: Borderline enlargement of cardiac silhouette post CABG. Mediastinal contours and pulmonary vascularity normal. Bibasilar atelectasis. Lungs otherwise clear. No pleural effusion or pneumothorax. Osseous demineralization with LEFT glenohumeral degenerative changes. IMPRESSION: Minimal enlargement of cardiac silhouette post CABG with bibasilar atelectasis. Electronically Signed   By: Lavonia Dana M.D.   On: 01/28/2018 02:27     Labs:   Basic Metabolic Panel: Recent Labs  Lab 01/28/18 0150 01/28/18 0523  NA 139  --   K 4.2  --   CL 109  --   CO2 21*  --   GLUCOSE 221*  --   BUN 25*  --   CREATININE 1.20 1.37*  CALCIUM 7.8*  --    GFR Estimated Creatinine Clearance: 49.3 mL/min (A) (by C-G formula based on SCr of 1.37 mg/dL (H)). Liver Function Tests: No results for input(s): AST, ALT, ALKPHOS, BILITOT, PROT, ALBUMIN in the last 168 hours. No results for input(s): LIPASE, AMYLASE in the last 168 hours. No results for input(s): AMMONIA in the last 168 hours. Coagulation profile No results for input(s): INR, PROTIME in the last 168 hours.  CBC: Recent Labs  Lab 01/28/18 0150 01/28/18 0523  WBC 7.4 8.1  HGB 8.8* 9.9*  HCT 27.7* 31.2*  MCV 90.8 89.9  PLT 280 326   Cardiac Enzymes: Recent Labs  Lab 01/28/18 0523 01/28/18 0941  TROPONINI <0.03 <0.03   BNP: Invalid input(s): POCBNP CBG: Recent Labs  Lab 01/28/18 0552 01/28/18 0757 01/28/18 1121  GLUCAP 173* 156* 148*   D-Dimer No results for input(s): DDIMER in the last 72 hours. Hgb A1c No results for input(s): HGBA1C in the last 72 hours. Lipid Profile No results for input(s): CHOL, HDL, LDLCALC, TRIG, CHOLHDL, LDLDIRECT in the last 72 hours. Thyroid function studies No results for input(s): TSH, T4TOTAL, T3FREE, THYROIDAB in the last 72 hours.  Invalid input(s): FREET3 Anemia work up No results for input(s): VITAMINB12, FOLATE,  FERRITIN, TIBC, IRON, RETICCTPCT in the last 72 hours. Microbiology No results found for this or any previous visit (from the past 240 hour(s)).   Discharge Instructions:   Discharge Instructions    Diet - low sodium heart healthy   Complete by:  As directed    Diet Carb Modified   Complete by:  As directed    Discharge instructions   Complete by:  As directed    imdur 60 mg BID and check his blood pressures. If he feels lightheaded or his BP drops to less than 643 systolic, he should return to imdur 60 mg once daily   Increase activity slowly   Complete by:  As directed      Allergies as of 01/28/2018      Reactions   Other Other (See Comments)   No  BLOOD  PRODUCTS - Pt is Jehovah's Witness   Statins Other (See Comments)   Cause muscle aches   Atorvastatin Other (See Comments)   Myalgias   Crestor [rosuvastatin] Other (See Comments)   Myalgias    Pravastatin Other (See Comments)   Myalgias   Simvastatin Other (See Comments)   Myalgias      Medication List    TAKE these medications   amLODipine 10 MG tablet Commonly known as:  NORVASC Take 10 mg by mouth every evening.   aspirin 81 MG chewable tablet Chew 1 tablet (81 mg total) by mouth daily. What changed:  when to take this   carvedilol 12.5 MG tablet Commonly known as:  COREG Take 1 tablet (12.5 mg total) by mouth 2 (two) times daily with a meal.   clopidogrel 75 MG tablet Commonly known as:  PLAVIX Take 1 tablet (75 mg total) by mouth daily. What changed:  when to take this   furosemide 20 MG tablet Commonly known as:  LASIX Take 1 tablet (20 mg total) by mouth as needed. What changed:    when to take this  reasons to take this   glimepiride 4 MG tablet Commonly known as:  AMARYL Take 4 mg by mouth 2 (two) times daily with a meal.   hydrALAZINE 100 MG tablet Commonly known as:  APRESOLINE Take 1 tablet (100 mg total) by mouth 3 (three) times daily.   isosorbide mononitrate 60 MG 24 hr  tablet Commonly known as:  IMDUR Take 1 tablet (60 mg total) by mouth 2 (two) times daily. What changed:    when to take this  Another medication with the same name was removed. Continue taking this medication, and follow the directions you see here.   losartan-hydrochlorothiazide 100-25 MG tablet Commonly known as:  HYZAAR Take 1 tablet by mouth every evening.   metFORMIN 1000 MG tablet Commonly known as:  GLUCOPHAGE Take 1 tablet (1,000 mg total) by mouth 2 (two) times daily.   nitroGLYCERIN 0.4 MG SL tablet Commonly known as:  NITROSTAT Place 1 tablet (0.4 mg total) under the tongue every 5 (five) minutes as needed for chest pain (up to 3 doses).   oxybutynin 5 MG tablet Commonly known as:  DITROPAN Take 5 mg by mouth daily as needed for bladder spasms.      Follow-up Information    Barrett, Evelene Croon, PA-C Follow up.   Specialties:  Cardiology, Radiology Why:  Cardiology hospital follow-up on October 1 at 10:00.  Please arrive 15 minutes early for check-in. Contact information: [address] [place] [address] [phone]        Suzan Garibaldi, FNP Follow up in 1 week(s).   Specialty:  Nurse Practitioner Contact information: [place] [address] [phone]        Leonie Man, MD .   Specialty:  Cardiology Contact information: [address] [place] [address] [phone]            Time coordinating discharge: 25 min  Signed:  Geradine Girt  [facility] 01/28/2018, 3:10 PM

## 2018-01-28 NOTE — ED Provider Notes (Signed)
[facility] EMERGENCY DEPARTMENT Provider Note   CSN: [id number] Arrival date & time: 01/28/18  0117     History   Chief Complaint Chief Complaint  Patient presents with  . Chest Pain    HPI [name] is a 84 y.o. male.  Patient presents with central chest pain that onset around 6 PM while he was reading after dinner.  States he thought this pain may be due to indigestion after eating dinner but it was constant all evening till about midnight when he called EMS.  He is now chest pain-free after receiving aspirin and nitroglycerin from EMS.  States the pain is in the center of his chest.  Did not radiate to his arm, back or neck.  Was associated with some nausea but no vomiting.  No shortness of breath or diaphoresis.  Pain is similar to previous CAD type pain.  Patient with complex cardiac history with CABG and multiple stents.  States compliance with his medication.  Denies any leg pain or leg swelling.  No abdominal pain.  Does have indwelling suprapubic catheter.  The history is provided by the patient and the EMS personnel.  Chest Pain   Pertinent negatives include no abdominal pain, no dizziness, no fever, no headaches, no nausea, no shortness of breath, no vomiting and no weakness.    Past Medical History:  Diagnosis Date  . Abdominal aortic aneurysm ([place])    a. Aortic duplex 06/2014: mild aneurysmal dilatation of proximal abdominal aorta measuring 3.4x3.4cm. No sig change from 2012. F/u due 06/2016;   . AKI (acute kidney injury) ([place]) 03/28/2016  . Arthritis    "right knee; never bothered me" (03/26/2016)  . 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 CAD in native artery    a. NSTEMI 11/2010 - CABG x2(LIMA to LAD, SVG to PDA). b. NEG Lexi MV 10/24/13, EF 53%, no perfusion abnormality, septal and apical HK noted. c. NSTEMI 05/2014 - s/p DES to SVG-RPDA 06/18/14 (Xience Alpine DES 3.0 x 18 mm -3.35 mm), EF 60-65; d. 03/2016  STEMI/PCI: LM nl, ost LAD 70%, mLCx 50%, pRCA 95% - mRCA 60%, dRPDA 70%, LIMA->LAD ok, o-p SVG->RPDA 100% (3.0x16 Promus DES overlapping prior stent).  . Diastolic dysfunction    a. 03/2016 Echo: EF 60-65%, no rwma, Gr1 DD, triv AI, Ao root 29mm, Asc Ao 45mm, triv MR.  . Dizziness    a. Carotid duplex 03/2014: mild fibrous plaque, no significant stenosis.  . Foley catheter in place    "been wearing it for a couple months now" (03/26/2016)  . HTN (hypertension)   . Hyperlipidemia LDL goal <70   . Non-STEMI (non-ST elevated myocardial infarction) ([place]) 11/2010   1. Ostial LAD 70% (to close to [facility] for PCI), subtotal occlusion of the RCA  . Postoperative atrial fibrillation ([place]) 11/2010   Post CABG, no sign recurrence  . Refusal of blood transfusions as patient is Jehovah's Witness   . 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 Servando Snare)  . Sleep apnea    Not on CPAP. (03/26/2016)  . ST elevation myocardial infarction (STEMI) of inferior wall ([place]) 04/05/2016   Occluded in-stent restenosis/thrombosis of SVG-RCA --> treated with overlapping Promus DES 3.0 mm 16 mm postdilated 3.0 mm).  . Stroke [facility]) 2014; 01/2015   a. 2014 with mild right hand weakness, nonhemorrhagic per pt.;; b. - PTA-Stent L ICA 95%   . Type II diabetes  mellitus [facility])     Patient Active Problem List   Diagnosis Date Noted  . Pseudomonas urinary tract infection   . Pure hypercholesterolemia   . Elevated troponin   . Hydrocele, right   . Olecranon bursitis of left elbow   . Chronic renal insufficiency, stage III (moderate) (HCC)   . Coronary artery disease due to calcified coronary lesion   . Suprapubic catheter ([place])   . History of urethral stricture   . Sepsis due to Pseudomonas aeruginosa ([place])   . Acute pyelonephritis   . Sepsis ([place]) 12/18/2017  . Hemispheric carotid artery syndrome 12/22/2016  . AKI (acute kidney injury) ([place]) 03/28/2016  . S/P angioplasty with stent 03/26/16 to VG to PDA for in-stent  restenosis with DES. 03/28/2016  . History of ST elevation myocardial infarction (STEMI) 03/28/2016  . Carpal tunnel syndrome of right wrist 06/24/2015  . History of stroke 03/21/2015  . Diabetes mellitus treated with oral medication ([place]) 03/21/2015  . Stenosis of left carotid artery   . Essential hypertension   . Hyperlipidemia LDL goal <70   . Cerebral infarction due to stenosis of left middle cerebral artery ([place]) 01/22/2015  . CAD S/P percutaneous coronary angioplasty 09/19/2014  . Coronary artery disease involving coronary bypass graft of native heart with other forms of angina pectoris ([place]) 09/19/2014  . Shortness of breath 09/19/2014  . Polypharmacy 09/19/2014  . Abdominal aortic aneurysm ([place])   . Stroke ([place])   . Hyperlipidemia associated with type 2 diabetes mellitus ([place])   . Type II diabetes mellitus with complication ([place])   . Coronary artery disease involving native coronary artery of native heart with angina pectoris ([place])   . Abnormal TSH 06/19/2014  . Obesity 06/19/2014  . STEMI (ST elevation myocardial infarction) ([place]) 06/18/2014  . Bilateral lower extremity edema 03/30/2014  . Dizziness 02/21/2014  . Bradycardia 08/31/2013  . Postoperative atrial fibrillation (HCC)     Class: Temporary  . Diabetes mellitus type 2 in obese ([place])   . Moderate essential hypertension   . Apnea, sleep   . Balanitis xerotica obliterans   . History of: Non-STEMI (non-ST elevated myocardial infarction) 11/16/2010    Class: History of  . S/P CABG x 2 11/16/2010    Past Surgical History:  Procedure Laterality Date  . APPENDECTOMY    . CARDIAC CATHETERIZATION  12/11/2010   Dr. Chase Picket - subsequent cath - normal LV systolic function, no renal artery stenosis, severe 2-vessel disease with subtotaled RCA prox and distal 60% lesion and complex 70% area of narrowing of ostium of LAD  . CARDIAC CATHETERIZATION N/A 03/26/2016   Procedure: Left Heart Cath and Coronary Angiography;   Surgeon: Nelva Bush, MD;  Location: [facility] CV LAB;  Service: Cardiovascular;  Laterality: N/A;  . CARDIAC CATHETERIZATION N/A 03/26/2016   Procedure: Coronary Stent Intervention;  Surgeon: Nelva Bush, MD;  Location: [facility] CV LAB;  Service: Cardiovascular: 100% In-stent thrombosis of pros SVG-RCA (Xience DES) --> treated with PromusDES 3.0 x 18 (3.3 mm)  . CARDIAC CATHETERIZATION N/A 03/26/2016   Procedure: Bypass Graft Angiography;  Surgeon: Nelva Bush, MD;  Location: [facility] CV LAB;  Service: Cardiovascular;  Laterality: N/A;  . CORONARY ANGIOPLASTY WITH STENT PLACEMENT  06/18/2014   PCI to SVG-RPDA 06/18/14 (Xience Alpine DES 3.0 x 18 mm -3.35 mm),   . CORONARY ARTERY BYPASS GRAFT  12/15/2010   X2, Dr Servando Snare; LIMA to LAD, SVG to PDA;   . CORONARY/GRAFT ANGIOGRAPHY N/A 08/27/2017  Procedure: CORONARY/GRAFT ANGIOGRAPHY;  Surgeon: Nelva Bush, MD;  Location: [facility] CV LAB;  Service: Cardiovascular;; pLAD 70%, ostD1 50%.  mCx 60%, OM2 80%. pRCA 95% & mRCA 100% - rPDA 70%. LIMA-mLAD patent. SVG-rPDA 10% ISR.   Marland Kitchen CYSTOSCOPY WITH URETHRAL DILATATION    . IR ANGIO INTRA EXTRACRAN SEL COM CAROTID INNOMINATE BILAT MOD SED  02/25/2017  . IR ANGIO INTRA EXTRACRAN SEL COM CAROTID INNOMINATE BILAT MOD SED  10/19/2017   Dr. Kathee Delton: L Common Carotid - ECA & major branches widely patent. ICA ~20% distal to bulb & 50% in supraclinoid segment. LMCA-distal 1/3 MI ~905 stenosis with post-stenotic dilation into inferior division. ~50% prox Basilar A stenosis @ anterior Inf Cerebellar A. 50% R ICA  . IR ANGIO VERTEBRAL SEL SUBCLAVIAN INNOMINATE UNI L MOD SED  02/25/2017  . IR ANGIO VERTEBRAL SEL SUBCLAVIAN INNOMINATE UNI R MOD SED  10/19/2017  . IR ANGIO VERTEBRAL SEL VERTEBRAL UNI L MOD SED  10/19/2017  . IR GENERIC HISTORICAL  01/21/2016   IR RADIOLOGIST EVAL & MGMT 01/21/2016 [facility]-INTERV RAD  . IR GENERIC HISTORICAL  02/03/2016   IR CATHETER TUBE CHANGE 02/03/2016 Marybelle Killings, MD  [facility]-INTERV RAD  . IR RADIOLOGIST EVAL & MGMT  11/09/2017  . LEFT HEART CATHETERIZATION WITH CORONARY ANGIOGRAM N/A 06/18/2014   Procedure: LEFT HEART CATHETERIZATION WITH CORONARY ANGIOGRAM;  Surgeon: Leonie Man, MD;  Location[facility] CATH LAB;  Service: Cardiovascular;  -- severe disease of SVG-rPDA  . RADIOLOGY WITH ANESTHESIA N/A 01/24/2015   Procedure: STENT ASSISTED ANGIOPLASTY (RADIOLOGY WITH ANESTHESIA);  Surgeon: Luanne Bras, MD;  Location: [facility];  Service: Radiology;  Laterality: N/A;  . TONSILLECTOMY    . TRANSTHORACIC ECHOCARDIOGRAM  3/'14; 11/'16:11/'17   a. EF 55-60%, severe Conc LVH, Nl Systolic fxn, G1 DD,  Ao Sclerosis w/o stenosis; CRO PFO;; b. Moderate concentric LVH. EF 60-65%. No RWMA, Gr 1 DD. Mild LA dilation;; c) HK of the distal inferior wall. EF 60-65%.  GR 1 DD. Ascending aorta/root 45-46 mm.  . TRANSTHORACIC ECHOCARDIOGRAM  08/2017    EF 60-65%. Mild LVH. No RWMA. Gr 1 DD. Mod LA dilation.         Home Medications    Prior to Admission medications   Medication Sig Start Date End Date Taking? Authorizing Provider  amLODipine (NORVASC) 10 MG tablet Take 10 mg by mouth every evening.     [provider]  aspirin 81 MG chewable tablet Chew 1 tablet (81 mg total) by mouth daily. 03/29/16   Isaiah Serge, NP  carvedilol (COREG) 12.5 MG tablet Take 1 tablet (12.5 mg total) by mouth 2 (two) times daily with a meal. 12/22/17   Asencion Noble, MD  clopidogrel (PLAVIX) 75 MG tablet Take 1 tablet (75 mg total) by mouth daily. 09/22/17   Croitoru, Mihai, MD  furosemide (LASIX) 20 MG tablet Take 1 tablet (20 mg total) by mouth as needed. Patient taking differently: Take 20 mg by mouth daily as needed for fluid (swelling).  09/23/17 12/22/17  Almyra Deforest, PA  glimepiride (AMARYL) 4 MG tablet Take 4 mg by mouth 2 (two) times daily with a meal.     [provider]  hydrALAZINE (APRESOLINE) 100 MG tablet Take 1 tablet (100 mg total) by mouth 3 (three) times daily.  12/16/17   Leonie Man, MD  isosorbide mononitrate (IMDUR) 60 MG 24 hr tablet Take 1 tablet (60 mg total) by mouth daily. 08/28/17   Erma Heritage, PA-C  isosorbide mononitrate (IMDUR) 60 MG 24 hr tablet Take 1 tablet (60 mg total) by mouth every evening. 12/22/17   Asencion Noble, MD  losartan-hydrochlorothiazide (HYZAAR) 100-25 MG tablet Take 1 tablet by mouth every evening. Patient taking differently: Take 1 tablet by mouth every morning.  12/16/17   Leonie Man, MD  metFORMIN (GLUCOPHAGE) 1000 MG tablet Take 1 tablet (1,000 mg total) by mouth 2 (two) times daily. 03/29/16   Isaiah Serge, NP  nitroGLYCERIN (NITROSTAT) 0.4 MG SL tablet Place 1 tablet (0.4 mg total) under the tongue every 5 (five) minutes as needed for chest pain (up to 3 doses). 08/25/17   Almyra Deforest, PA  oxybutynin (DITROPAN) 5 MG tablet Take 5 mg by mouth daily as needed for bladder spasms.  09/07/17   [provider]    Family History Family History  Problem Relation Age of Onset  . Heart Problems Father 63    Social History Social History   Tobacco Use  . Smoking status: Former Smoker    Packs/day: 0.33    Years: 10.00    Pack years: 3.30    Types: Cigarettes    Last attempt to quit: 1961    Years since quitting: 58.7  . Smokeless tobacco: Never Used  Substance Use Topics  . Alcohol use: No  . Drug use: No     Allergies   Other; Atorvastatin; Crestor [rosuvastatin]; Pravastatin; and Simvastatin   Review of Systems Review of Systems  Constitutional: Negative for activity change, appetite change and fever.  HENT: Negative for congestion.   Eyes: Negative for visual disturbance.  Respiratory: Positive for chest tightness. Negative for shortness of breath.   Cardiovascular: Positive for chest pain.  Gastrointestinal: Negative for abdominal pain, nausea and vomiting.  Genitourinary: Negative for dysuria, hematuria and testicular pain.  Musculoskeletal: Negative for arthralgias and  myalgias.  Skin: Negative for rash.  Neurological: Negative for dizziness, weakness and headaches.   all other systems are negative except as noted in the HPI and PMH.     Physical Exam Updated Vital Signs Pulse (!) 58   Temp 97.9 F (36.6 C) (Oral)   Resp (!) 23   SpO2 99%   Physical Exam  Constitutional: He is oriented to person, place, and time. He appears well-developed and well-nourished. No distress.  HENT:  Head: Normocephalic and atraumatic.  Mouth/Throat: Oropharynx is clear and moist. No oropharyngeal exudate.  Eyes: Pupils are equal, round, and reactive to light. Conjunctivae and EOM are normal.  Neck: Normal range of motion. Neck supple.  No meningismus.  Cardiovascular: Normal rate, regular rhythm, normal heart sounds and intact distal pulses.  No murmur heard. Equal radial pulses and grip strengths  Pulmonary/Chest: Effort normal and breath sounds normal. No respiratory distress. He exhibits no tenderness.  Abdominal: Soft. There is no tenderness. There is no rebound and no guarding.  Musculoskeletal: Normal range of motion. He exhibits no edema or tenderness.  Neurological: He is alert and oriented to person, place, and time. No cranial nerve deficit. He exhibits normal muscle tone. Coordination normal.  No ataxia on finger to nose bilaterally. No pronator drift. 5/5 strength throughout. CN 2-12 intact.Equal grip strength. Sensation intact.   Skin: Skin is warm.  Psychiatric: He has a normal mood and affect. His behavior is normal.  Nursing note and vitals reviewed.    ED Treatments / Results  Labs (all labs ordered are listed, but only abnormal results are displayed) Labs Reviewed  BASIC METABOLIC PANEL -  Abnormal; Notable for the following components:      Result Value   CO2 21 (*)    Glucose, Bld 221 (*)    BUN 25 (*)    Calcium 7.8 (*)    GFR calc non Af Amer 54 (*)    All other components within normal limits  CBC - Abnormal; Notable for the  following components:   RBC 3.05 (*)    Hemoglobin 8.8 (*)    HCT 27.7 (*)    RDW 16.1 (*)    All other components within normal limits  I-STAT TROPONIN, ED    EKG EKG Interpretation  Date/Time:  Friday January 28 2018 01:20:24 EDT Ventricular Rate:  61 PR Interval:    QRS Duration: 88 QT Interval:  397 QTC Calculation: 400 R Axis:   -25 Text Interpretation:  Sinus rhythm Prolonged PR interval Borderline left axis deviation No significant change was found Confirmed by Ezequiel Essex [phone]) on 01/28/2018 7:11:38 AM   Radiology Dg Chest 2 View  Result Date: 01/28/2018 CLINICAL DATA:  Chest pain possible mi EXAM: CHEST - 2 VIEW COMPARISON:  12/13/2017 FINDINGS: Borderline enlargement of cardiac silhouette post CABG. Mediastinal contours and pulmonary vascularity normal. Bibasilar atelectasis. Lungs otherwise clear. No pleural effusion or pneumothorax. Osseous demineralization with LEFT glenohumeral degenerative changes. IMPRESSION: Minimal enlargement of cardiac silhouette post CABG with bibasilar atelectasis. Electronically Signed   By: Lavonia Dana M.D.   On: 01/28/2018 02:27    Procedures Procedures (including critical care time)  Medications Ordered in ED Medications - No data to display   Initial Impression / Assessment and Plan / ED Course  I have reviewed the triage vital signs and the nursing notes.  Pertinent labs & imaging results that were available during my care of the patient were reviewed by me and considered in my medical decision making (see chart for details).    6 hours of chest pain, now resolved.  EKG with inferior Q waves without acute ST changes. Appears similar to last month.  Troponin negative x1.  Labs show stable anemia and CKD.  Cardiology recommended medical management during admission last month for chest pain.   Cardiac cath 08/27/2017 Conclusions: 1. Severe 3-vessel coronary artery disease, as detailed below. Mid RCA is now  occluded. Otherwise, there has been no significant change since 03/2016. 2. Widely patent LIMA-LAD. 3. Patent SVG-rPDA. Patent SVG overlapping stents have minimal in-stent restenosis. 4. Inability to cross aortic valve from the left radial. 5. Systemic hypertension  Patient remains chest pain-free.  Low suspicion for aortic dissection or pulmonary embolism.  Given his high risk cardiac history we will plan admission for rule out.  Discussed with Dr. Hal Hope.  Final Clinical Impressions(s) / ED Diagnoses   Final diagnoses:  Nonspecific chest pain    ED Discharge Orders    None       Jennifier Smitherman, Annie Main, MD 01/28/18 [phone]

## 2018-01-28 NOTE — ED Notes (Signed)
Patient transported to X-ray 

## 2018-02-01 DIAGNOSIS — R3914 Feeling of incomplete bladder emptying: Secondary | ICD-10-CM | POA: Diagnosis not present

## 2018-02-15 ENCOUNTER — Ambulatory Visit (INDEPENDENT_AMBULATORY_CARE_PROVIDER_SITE_OTHER): Payer: Medicare Other | Admitting: Physician Assistant

## 2018-02-15 ENCOUNTER — Other Ambulatory Visit: Payer: Self-pay | Admitting: Physician Assistant

## 2018-02-15 ENCOUNTER — Encounter: Payer: Self-pay | Admitting: Physician Assistant

## 2018-02-15 VITALS — BP 156/64 | HR 53 | Ht 73.0 in | Wt 215.0 lb

## 2018-02-15 DIAGNOSIS — R0609 Other forms of dyspnea: Secondary | ICD-10-CM | POA: Diagnosis not present

## 2018-02-15 DIAGNOSIS — R001 Bradycardia, unspecified: Secondary | ICD-10-CM

## 2018-02-15 DIAGNOSIS — R7989 Other specified abnormal findings of blood chemistry: Secondary | ICD-10-CM

## 2018-02-15 DIAGNOSIS — I2510 Atherosclerotic heart disease of native coronary artery without angina pectoris: Secondary | ICD-10-CM | POA: Diagnosis not present

## 2018-02-15 DIAGNOSIS — I10 Essential (primary) hypertension: Secondary | ICD-10-CM

## 2018-02-15 DIAGNOSIS — I200 Unstable angina: Secondary | ICD-10-CM | POA: Diagnosis not present

## 2018-02-15 MED ORDER — CARVEDILOL 12.5 MG PO TABS: ORAL_TABLET | ORAL | 6 refills | Status: DC

## 2018-02-15 NOTE — Patient Instructions (Signed)
Decrease Carvedilol to 1/2 tablet ( 6.25 mg ) twice a day    Start taking Plavix and Aspirin at lunch    Take blood pressure daily at various times    Exercise continuous 10 min daily ( mowing,weed eating )    Schedule appointment with Pharmacy in 2 weeks for medication titration      Keep appointment with Dr.Harding  Tuesday 04/26/18 at 1:40 pm

## 2018-02-15 NOTE — Progress Notes (Signed)
Cardiology Office Note   Date:  02/15/2018   ID:  [name], DOB Mar 14, 1931, MRN [id number]  PCP:  Suzan Garibaldi, FNP  Cardiologist:  Glenetta Hew, MD  Rosaria Ferries, PA-C   No chief complaint on file.   History of Present Illness: [name] is a 84 y.o. male with a history of CABG w/ LIMA-LAD & SVG-PDA, CVA with 90% left MCA stenosis, hypertension, diabetes mellitus, chronic diastolic heart failure with normal LVEF per echo on 03/2016, dilated aortic root, postoperative A. fib with no recurrence, DES SVG-PDA 2016, DES SVG-PDA 2017  Cath April 2019 showed severe three-vessel coronary artery disease with newly occluded mid RCA, 70% proximal LAD,widely patent LIMA to the LAD and patent SVG to the RPDA with minimal in-stent restenosis in the SVG, 80% small OM 2, 60% mid left circumflex and 50% diagonal 1. Medical therapy  Admitted 8/3-12/22/2017 for sepsis, chest pain treated with increased Imdur  Office visit 8/27, patient doing well  Admitted 9/13-9/13/2019 for chest pain, seen by cardiology, chest pain associated with systolic blood pressure greater than 200, Imdur changed to 60 mg twice daily.  [name] presents for cardiology follow up.   He feels he has no stamina. He is active around the house and push-mows the yard, but has to stop frequently to rest.  He feels like he can barely do anything before he has to stop to rest.  He feels this has become worse in the last few weeks.  He feels worse after he takes his am medications.  He will get up, cook breakfast for his wife and mother-in-law, eat himself, and then take his medications.  He will feel well at that time, but about an hour later, he starts to feel terrible and this feeling lasts until lunch.  He starts feeling better after lunch.   He seldom checks his BP and HR.   He has not had chest pain.   Gets some daytime edema, rarely takes the prn Lasix. Probably averages 3-4 x month. No orthopnea or  PND.  No palpitations, no presyncope or syncope.   Cares for his wife and wife's mother (7 years old).    Past Medical History:  Diagnosis Date  . Abdominal aortic aneurysm ([place])    a. Aortic duplex 06/2014: mild aneurysmal dilatation of proximal abdominal aorta measuring 3.4x3.4cm. No sig change from 2012. F/u due 06/2016;   . AKI (acute kidney injury) ([place]) 03/28/2016  . Arthritis    "right knee; never bothered me" (03/26/2016)  . 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 CAD in native artery    a. NSTEMI 11/2010 - CABG x2(LIMA to LAD, SVG to PDA). b. NEG Lexi MV 10/24/13, EF 53%, no perfusion abnormality, septal and apical HK noted. c. NSTEMI 05/2014 - s/p DES to SVG-RPDA 06/18/14 (Xience Alpine DES 3.0 x 18 mm -3.35 mm), EF 60-65; d. 03/2016 STEMI/PCI: LM nl, ost LAD 70%, mLCx 50%, pRCA 95% - mRCA 60%, dRPDA 70%, LIMA->LAD ok, o-p SVG->RPDA 100% (3.0x16 Promus DES overlapping prior stent).  . Diastolic dysfunction    a. 03/2016 Echo: EF 60-65%, no rwma, Gr1 DD, triv AI, Ao root 104mm, Asc Ao 9mm, triv MR.  . Dizziness    a. Carotid duplex 03/2014: mild fibrous plaque, no significant stenosis.  . Foley catheter in place    "been wearing it for a couple months now" (03/26/2016)  . HTN (hypertension)   . Hyperlipidemia  LDL goal <70   . Non-STEMI (non-ST elevated myocardial infarction) ([place]) 11/2010   1. Ostial LAD 70% (to close to [facility] for PCI), subtotal occlusion of the RCA  . Postoperative atrial fibrillation ([place]) 11/2010   Post CABG, no sign recurrence  . Refusal of blood transfusions as patient is Jehov[employer]'s Witness   . 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 Servando Snare)  . Sleep apnea    Not on CPAP. (03/26/2016)  . ST elevation myocardial infarction (STEMI) of inferior wall ([place]) 04/05/2016   Occluded in-stent restenosis/thrombosis of SVG-RCA --> treated with overlapping Promus DES 3.0 mm 16 mm postdilated 3.0 mm).  . Stroke [facility]) 2014;  01/2015   a. 2014 with mild right hand weakness, nonhemorrhagic per pt.;; b. - PTA-Stent L ICA 95%   . Type II diabetes mellitus ([place])     Past Surgical History:  Procedure Laterality Date  . APPENDECTOMY    . CARDIAC CATHETERIZATION  12/11/2010   Dr. Chase Picket - subsequent cath - normal LV systolic function, no renal artery stenosis, severe 2-vessel disease with subtotaled RCA prox and distal 60% lesion and complex 70% area of narrowing of ostium of LAD  . CARDIAC CATHETERIZATION N/A 03/26/2016   Procedure: Left Heart Cath and Coronary Angiography;  Surgeon: Nelva Bush, MD;  Location: [place] CV [facility];  Service: Cardiovascular;  Laterality: N/A;  . CARDIAC CATHETERIZATION N/A 03/26/2016   Procedure: Coronary Stent Intervention;  Surgeon: Nelva Bush, MD;  Location: [place] CV [facility];  Service: Cardiovascular: 100% In-stent thrombosis of pros SVG-RCA (Xience DES) --> treated with PromusDES 3.0 x 18 (3.3 mm)  . CARDIAC CATHETERIZATION N/A 03/26/2016   Procedure: Bypass Graft Angiography;  Surgeon: Nelva Bush, MD;  Location: [place] [facility];  Service: Cardiovascular;  Laterality: N/A;  . CORONARY ANGIOPLASTY WITH STENT PLACEMENT  06/18/2014   PCI to SVG-RPDA 06/18/14 (Xience Alpine DES 3.0 x 18 mm -3.35 mm),   . CORONARY ARTERY BYPASS GRAFT  12/15/2010   X2, Dr Servando Snare; LIMA to LAD, SVG to PDA;   . CORONARY/GRAFT ANGIOGRAPHY N/A 08/27/2017   Procedure: CORONARY/GRAFT ANGIOGRAPHY;  Surgeon: Nelva Bush, MD;  Location: [place] [facility];  Service: Cardiovascular;; pLAD 70%, ostD1 50%.  mCx 60%, OM2 80%. pRCA 95% & mRCA 100% - rPDA 70%. LIMA-mLAD patent. SVG-rPDA 10% ISR.   Marland Kitchen CYSTOSCOPY WITH URETHRAL DILATATION    . IR ANGIO INTRA EXTRACRAN SEL COM CAROTID INNOMINATE BILAT MOD SED  02/25/2017  . IR ANGIO INTRA EXTRACRAN SEL COM CAROTID INNOMINATE BILAT MOD SED  10/19/2017   Dr. Kathee Delton: L Common Carotid - ECA & major branches widely patent. ICA ~20% distal to bulb & 50% in  supraclinoid segment. LMCA-distal 1/3 MI ~905 stenosis with post-stenotic dilation into inferior division. ~50% prox Basilar A stenosis @ anterior Inf Cerebellar A. 50% R ICA  . IR ANGIO VERTEBRAL SEL SUBCLAVIAN INNOMINATE UNI L MOD SED  02/25/2017  . IR ANGIO VERTEBRAL SEL SUBCLAVIAN INNOMINATE UNI R MOD SED  10/19/2017  . IR ANGIO VERTEBRAL SEL VERTEBRAL UNI L MOD SED  10/19/2017  . IR GENERIC HISTORICAL  01/21/2016   IR RADIOLOGIST EVAL & MGMT 01/21/2016 [facility]INTER[facility]  . IR GENERIC HISTORICAL  02/03/2016   IR CATHETER TUBE CHANGE 02/03/2016 Marybelle Killings, MD [facility]INTER[facility]  . IR RADIOLOGIST EVAL & MGMT  11/09/2017  . LEFT HEART CATHETERIZATION WITH CORONARY ANGIOGRAM N/A 06/18/2014   Procedure: LEFT HEART CATHETERIZATION WITH CORONARY ANGIOGRAM;  Surgeon: Shanon Brow  Loren Racer, MD;  Location: [facility] CATH LAB;  Service: Cardiovascular;  -- severe disease of SVG-rPDA  . RADIOLOGY WITH ANESTHESIA N/A 01/24/2015   Procedure: STENT ASSISTED ANGIOPLASTY (RADIOLOGY WITH ANESTHESIA);  Surgeon: Luanne Bras, MD;  Location: [place];  Service: Radiology;  Laterality: N/A;  . TONSILLECTOMY    . TRANSTHORACIC ECHOCARDIOGRAM  3/'14; 11/'16:11/'17   a. EF 55-60%, severe Conc LVH, Nl Systolic fxn, G1 DD,  Ao Sclerosis w/o stenosis; CRO PFO;; b. Moderate concentric LVH. EF 60-65%. No RWMA, Gr 1 DD. Mild LA dilation;; c) HK of the distal inferior wall. EF 60-65%.  GR 1 DD. Ascending aorta/root 45-46 mm.  . TRANSTHORACIC ECHOCARDIOGRAM  08/2017    EF 60-65%. Mild LVH. No RWMA. Gr 1 DD. Mod LA dilation.     Current Outpatient Medications  Medication Sig Dispense Refill  . amLODipine (NORVASC) 10 MG tablet Take 10 mg by mouth every evening.     Marland Kitchen aspirin 81 MG chewable tablet Chew 1 tablet (81 mg total) by mouth daily. (Patient taking differently: Chew 81 mg by mouth every morning. )    . carvedilol (COREG) 12.5 MG tablet Take 1 tablet (12.5 mg total) by mouth 2 (two) times daily with a meal. 30 tablet 3  . clopidogrel (PLAVIX) 75 MG  tablet Take 1 tablet (75 mg total) by mouth daily. (Patient taking differently: Take 75 mg by mouth every morning. ) 90 tablet 3  . furosemide (LASIX) 20 MG tablet Take 20 mg by mouth as directed.    Marland Kitchen glimepiride (AMARYL) 4 MG tablet Take 4 mg by mouth 2 (two) times daily with a meal.     . hydrALAZINE (APRESOLINE) 100 MG tablet Take 1 tablet (100 mg total) by mouth 3 (three) times daily. 270 tablet 3  . isosorbide mononitrate (IMDUR) 60 MG 24 hr tablet Take 1 tablet (60 mg total) by mouth 2 (two) times daily. 60 tablet 0  . losartan-hydrochlorothiazide (HYZAAR) 100-25 MG tablet Take 1 tablet by mouth every evening. 90 tablet 3  . metFORMIN (GLUCOPHAGE) 1000 MG tablet Take 1 tablet (1,000 mg total) by mouth 2 (two) times daily.    . nitroGLYCERIN (NITROSTAT) 0.4 MG SL tablet Place 1 tablet (0.4 mg total) under the tongue every 5 (five) minutes as needed for chest pain (up to 3 doses). 25 tablet 3  . oxybutynin (DITROPAN) 5 MG tablet Take 5 mg by mouth daily as needed for bladder spasms.      No current facility-administered medications for this visit.     Allergies:   Other; Statins; Atorvastatin; Crestor [rosuvastatin]; Pravastatin; and Simvastatin    Social History:  The patient  reports that he quit smoking about 58 years ago. His smoking use included cigarettes. He has a 3.30 pack-year smoking history. He has never used smokeless tobacco. He reports that he does not drink alcohol or use drugs.   Family History:  The patient's family history includes Heart Problems (age of onset: 74) in his father.    ROS:  Please see the history of present illness. All other systems are reviewed and negative.    PHYSICAL EXAM: VS:  BP (!) 156/64   Pulse (!) 53   Ht 6\' 1"  (1.854 m)   Wt 215 lb (97.5 kg)   SpO2 98%   BMI 28.37 kg/m  , BMI Body mass index is 28.37 kg/m. GEN: Well nourished, well developed, male in no acute distress  HEENT: normal for age  Neck: no JVD, no carotid  bruit, no  masses Cardiac: RRR; soft murmur, no rubs, or gallops Respiratory:  clear to auscultation bilaterally, normal work of breathing GI: soft, nontender, nondistended, + BS MS: no deformity or atrophy; trace pedal edema; distal pulses are 2+ in all 4 extremities   Skin: warm and dry, no rash Neuro:  Strength and sensation are intact Psych: euthymic mood, full affect   EKG:  EKG is not ordered today.   Recent Labs: 12/13/2017: B Natriuretic Peptide 190.3; TSH 0.108 12/18/2017: ALT 10 01/28/2018: BUN 25; Creatinine, Ser 1.37; Hemoglobin 9.9; Platelets 326; Potassium 4.2; Sodium 139   Lab Results  Component Value Date   TSH 0.108 (L) 12/13/2017    Lipid Panel    Component Value Date/Time   CHOL 134 01/11/2017 0958   TRIG 83 01/11/2017 0958   HDL 40 01/11/2017 0958   CHOLHDL 3.4 01/11/2017 0958   CHOLHDL 3.2 03/26/2016 0531   VLDL 16 03/26/2016 0531   LDLCALC 77 01/11/2017 0958     Wt Readings from Last 3 Encounters:  02/15/18 215 lb (97.5 kg)  01/28/18 214 lb (97.1 kg)  01/11/18 217 lb 3.2 oz (98.5 kg)     Other studies Reviewed: Additional studies/ records that were reviewed today include: Office notes, hospital records and testing.  ASSESSMENT AND PLAN:  1. DOE, possibly secondary to bradycardia: His heart rate has been in the 50s on multiple readings.  However, he describes decreased exercise tolerance. -I am concerned that he has chronotropic incompetence and his heart rate is not increasing as needed with exertion. - I will decrease the carvedilol and see if that improves his symptoms. -If decreasing the carvedilol does not improve his symptoms -If his symptoms do not improve, get him to wear a ZIO monitor  2.  Hypertension: He is on the maximum dose of amlodipine, losartan (with HCTZ), and on a high dose of hydralazine at 100 mg 3 times daily.  He also takes Imdur 60 mg twice daily. - According to the patient, his blood pressure at home is consistently elevated. - I  will decrease the carvedilol to see if that helps his other symptoms -He will follow-up with the Pharmacist for hypertension management.  3.  Abnormal TSH: His TSH was previously normal, but that was 2016. -His TSH was low when checked in July 2019. -When he comes in for the Pharmacist appointment, if he has not followed up with his PCP for this, he needs a TSH and free T4 drawn.  These are ordered. -May need referral to endocrinology or back to his PCP if the labs are still abnormal   Current medicines are reviewed at length with the patient today.  The patient does not have concerns regarding medicines.  The following changes have been made: Decrease carvedilol  Labs/ tests ordered today include:  No orders of the defined types were placed in this encounter.    Disposition:   FU with Glenetta Hew, MD   Signed, Rosaria Ferries, PA-C  02/15/2018 10:05 AM    [place] Phone: [phone]; Fax: [phone]  This note was written with the assistance of speech recognition software. Please excuse any transcriptional errors.

## 2018-02-17 ENCOUNTER — Telehealth (INDEPENDENT_AMBULATORY_CARE_PROVIDER_SITE_OTHER): Payer: Self-pay | Admitting: Radiology

## 2018-02-17 NOTE — Telephone Encounter (Signed)
Patient called to triage asking to speak with you.  I did not see a referral in his chart, but he says Dr Rock Nephew is referring him.  I tried to help but was not sure what was needed. Can you call him please? Thanks.

## 2018-02-21 ENCOUNTER — Ambulatory Visit (INDEPENDENT_AMBULATORY_CARE_PROVIDER_SITE_OTHER): Payer: Medicare Other | Admitting: Orthopedic Surgery

## 2018-02-21 ENCOUNTER — Encounter (INDEPENDENT_AMBULATORY_CARE_PROVIDER_SITE_OTHER): Payer: Self-pay | Admitting: Orthopedic Surgery

## 2018-02-21 ENCOUNTER — Ambulatory Visit (INDEPENDENT_AMBULATORY_CARE_PROVIDER_SITE_OTHER): Payer: Medicare Other

## 2018-02-21 DIAGNOSIS — I200 Unstable angina: Secondary | ICD-10-CM | POA: Diagnosis not present

## 2018-02-21 DIAGNOSIS — M1711 Unilateral primary osteoarthritis, right knee: Secondary | ICD-10-CM

## 2018-02-21 DIAGNOSIS — G8929 Other chronic pain: Secondary | ICD-10-CM

## 2018-02-21 DIAGNOSIS — M25561 Pain in right knee: Secondary | ICD-10-CM | POA: Diagnosis not present

## 2018-02-21 MED ORDER — LIDOCAINE HCL 1 % IJ SOLN
5.0000 mL | INTRAMUSCULAR | Status: AC | PRN
  Administered 2018-02-21: 5 mL

## 2018-02-21 MED ORDER — METHYLPREDNISOLONE ACETATE 40 MG/ML IJ SUSP
40.0000 mg | INTRAMUSCULAR | Status: AC | PRN
  Administered 2018-02-21: 40 mg via INTRA_ARTICULAR

## 2018-02-21 NOTE — Progress Notes (Signed)
Office Visit Note   Patient: [name]           Date of Birth: 06/20/1930           MRN: [id number] Visit Date: 02/21/2018              Requested by: Suzan Garibaldi, [address] [place], [place] [address] PCP: Suzan Garibaldi, FNP  Chief Complaint  Patient presents with  . Right Knee - Pain      HPI: Patient is a 84 year old gentleman who complains of osteoarthritis of his right knee.  Patient states that is been getting worse over the past 3 months pain globally around his knee patient states he has difficulty getting from a sitting to a standing position.  Past medical history is updated patient is on Plavix and aspirin as well as metformin.  Patient has a history of bladder problems he has a catheter bag diabetes hypertension and stroke.  Assessment & Plan: Visit Diagnoses:  1. Chronic pain of right knee   2. Unilateral primary osteoarthritis, right knee     Plan: Patient's knee was injected will follow-up in 4 weeks.  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  Doubt he would be able to proceed with a total knee arthroplasty.  Follow-Up Instructions: Return in about 4 weeks (around 03/21/2018).   Ortho Exam  Patient is alert, oriented, no adenopathy, well-dressed, normal affect, normal respiratory effort. Examination patient has difficulty getting from a sitting to a standing position.  He has no effusion no cellulitis of the right knee collaterals and cruciates are stable there is crepitation with range of motion.  Patient is tender to palpation over the patellofemoral joint as well as medial and lateral joint lines.  Imaging: Xr Knee 1-2 Views Right  Result Date: 02/21/2018 2 view radiographs of the right knee shows tricompartmental arthritic changes with periarticular bony spurs in all 3 compartments.  There is bone-on-bone contact medially joint space subluxation subcondylar sclerosis.  No images are attached to the encounter.  Labs: Lab  Results  Component Value Date   HGBA1C 7.1 (H) 03/26/2016   HGBA1C 7.2 (H) 03/26/2016   HGBA1C 7.8 (H) 01/22/2015   LABURIC 8.3 12/18/2017   REPTSTATUS 12/21/2017 FINAL 12/18/2017   GRAMSTAIN  12/18/2017    FEW WBC PRESENT, PREDOMINANTLY PMN NO ORGANISMS SEEN    CULT  12/18/2017    NO GROWTH 2 DAYS Performed at [facility], [place] [address] [place], [place] [address]    LABORGA PSEUDOMONAS AERUGINOSA (A) 12/18/2017     Lab Results  Component Value Date   ALBUMIN 3.6 12/18/2017   ALBUMIN 3.7 12/13/2017   ALBUMIN 4.0 01/11/2017   LABURIC 8.3 12/18/2017    There is no height or weight on file to calculate BMI.  Orders:  Orders Placed This Encounter  Procedures  . Large Joint Inj  . XR Knee 1-2 Views Right   No orders of the defined types were placed in this encounter.    Procedures: Large Joint Inj: R knee on 02/21/2018 12:56 PM Indications: pain and diagnostic evaluation Details: 22 G 1.5 in needle, anteromedial approach  Arthrogram: No  Medications: 5 mL lidocaine 1 %; 40 mg methylPREDNISolone acetate 40 MG/ML Outcome: tolerated well, no immediate complications Procedure, treatment alternatives, risks and benefits explained, specific risks discussed. Consent was given by the patient. Immediately prior to procedure a time out was called to verify the correct patient, procedure, equipment, support staff  and site/side marked as required. Patient was prepped and draped in the usual sterile fashion.      Clinical Data: No additional findings.  ROS:  All other systems negative, except as noted in the HPI. Review of Systems  Objective: Vital Signs: There were no vitals taken for this visit.  Specialty Comments:  No specialty comments available.  PMFS History: Patient Active Problem List   Diagnosis Date Noted  . Chest pain 01/28/2018  . Pseudomonas urinary tract infection   . Pure hypercholesterolemia   . Elevated troponin   . Hydrocele, right     . Olecranon bursitis of left elbow   . Chronic renal insufficiency, stage III (moderate) (HCC)   . Coronary artery disease due to calcified coronary lesion   . Suprapubic catheter ([place])   . History of urethral stricture   . Sepsis due to Pseudomonas aeruginosa ([place])   . Acute pyelonephritis   . Sepsis ([place]) 12/18/2017  . Hemispheric carotid artery syndrome 12/22/2016  . AKI (acute kidney injury) ([place]) 03/28/2016  . S/P angioplasty with stent 03/26/16 to VG to PDA for in-stent restenosis with DES. 03/28/2016  . History of ST elevation myocardial infarction (STEMI) 03/28/2016  . Carpal tunnel syndrome of right wrist 06/24/2015  . History of stroke 03/21/2015  . Diabetes mellitus treated with oral medication ([place]) 03/21/2015  . Stenosis of left carotid artery   . Essential hypertension   . Hyperlipidemia LDL goal <70   . Cerebral infarction due to stenosis of left middle cerebral artery ([place]) 01/22/2015  . CAD S/P percutaneous coronary angioplasty 09/19/2014  . Coronary artery disease involving coronary bypass graft of native heart with other forms of angina pectoris ([place]) 09/19/2014  . Shortness of breath 09/19/2014  . Polypharmacy 09/19/2014  . Abdominal aortic aneurysm ([place])   . Stroke ([place])   . Hyperlipidemia associated with type 2 diabetes mellitus ([place])   . Type II diabetes mellitus with complication ([place])   . Coronary artery disease involving native coronary artery of native heart with angina pectoris ([place])   . Abnormal TSH 06/19/2014  . Obesity 06/19/2014  . STEMI (ST elevation myocardial infarction) ([place]) 06/18/2014  . Bilateral lower extremity edema 03/30/2014  . Dizziness 02/21/2014  . Bradycardia 08/31/2013  . Postoperative atrial fibrillation (HCC)     Class: Temporary  . Diabetes mellitus type 2 in obese ([place])   . Moderate essential hypertension   . Apnea, sleep   . Balanitis xerotica obliterans   . History of: Non-STEMI (non-ST elevated myocardial infarction)  11/16/2010    Class: History of  . S/P CABG x 2 11/16/2010   Past Medical History:  Diagnosis Date  . Abdominal aortic aneurysm ([place])    a. Aortic duplex 06/2014: mild aneurysmal dilatation of proximal abdominal aorta measuring 3.4x3.4cm. No sig change from 2012. F/u due 06/2016;   . AKI (acute kidney injury) ([place]) 03/28/2016  . Arthritis    "right knee; never bothered me" (03/26/2016)  . 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 CAD in native artery    a. NSTEMI 11/2010 - CABG x2(LIMA to LAD, SVG to PDA). b. NEG Lexi MV 10/24/13, EF 53%, no perfusion abnormality, septal and apical HK noted. c. NSTEMI 05/2014 - s/p DES to SVG-RPDA 06/18/14 (Xience Alpine DES 3.0 x 18 mm -3.35 mm), EF 60-65; d. 03/2016 STEMI/PCI: LM nl, ost LAD 70%, mLCx 50%, pRCA 95% - mRCA 60%, dRPDA 70%, LIMA->LAD ok, o-p SVG->RPDA 100% (3.0x16 Promus  DES overlapping prior stent).  . Diastolic dysfunction    a. 03/2016 Echo: EF 60-65%, no rwma, Gr1 DD, triv AI, Ao root 25mm, Asc Ao 24mm, triv MR.  . Dizziness    a. Carotid duplex 03/2014: mild fibrous plaque, no significant stenosis.  . Foley catheter in place    "been wearing it for a couple months now" (03/26/2016)  . HTN (hypertension)   . Hyperlipidemia LDL goal <70   . Non-STEMI (non-ST elevated myocardial infarction) ([place]) 11/2010   1. Ostial LAD 70% (to close to [facility] for PCI), subtotal occlusion of the RCA  . Postoperative atrial fibrillation ([place]) 11/2010   Post CABG, no sign recurrence  . Refusal of blood transfusions as patient is Jehovah's Witness   . 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 Servando Snare)  . Sleep apnea    Not on CPAP. (03/26/2016)  . ST elevation myocardial infarction (STEMI) of inferior wall ([place]) 04/05/2016   Occluded in-stent restenosis/thrombosis of SVG-RCA --> treated with overlapping Promus DES 3.0 mm 16 mm postdilated 3.0 mm).  . Stroke [facility]) 2014; 01/2015   a. 2014 with mild right hand weakness,  nonhemorrhagic per pt.;; b. - PTA-Stent L ICA 95%   . Type II diabetes mellitus (HCC)     Family History  Problem Relation Age of Onset  . Heart Problems Father 37    Past Surgical History:  Procedure Laterality Date  . APPENDECTOMY    . CARDIAC CATHETERIZATION  12/11/2010   Dr. Chase Picket - subsequent cath - normal LV systolic function, no renal artery stenosis, severe 2-vessel disease with subtotaled RCA prox and distal 60% lesion and complex 70% area of narrowing of ostium of LAD  . CARDIAC CATHETERIZATION N/A 03/26/2016   Procedure: Left Heart Cath and Coronary Angiography;  Surgeon: Nelva Bush, MD;  Location: [facility];  Service: Cardiovascular;  Laterality: N/A;  . CARDIAC CATHETERIZATION N/A 03/26/2016   Procedure: Coronary Stent Intervention;  Surgeon: Nelva Bush, MD;  Location: [facility];  Service: Cardiovascular: 100% In-stent thrombosis of pros SVG-RCA (Xience DES) --> treated with PromusDES 3.0 x 18 (3.3 mm)  . CARDIAC CATHETERIZATION N/A 03/26/2016   Procedure: Bypass Graft Angiography;  Surgeon: Nelva Bush, MD;  Location: [facility];  Service: Cardiovascular;  Laterality: N/A;  . CORONARY ANGIOPLASTY WITH STENT PLACEMENT  06/18/2014   PCI to SVG-RPDA 06/18/14 (Xience Alpine DES 3.0 x 18 mm -3.35 mm),   . CORONARY ARTERY BYPASS GRAFT  12/15/2010   X2, Dr Servando Snare; LIMA to LAD, SVG to PDA;   . CORONARY/GRAFT ANGIOGRAPHY N/A 08/27/2017   Procedure: CORONARY/GRAFT ANGIOGRAPHY;  Surgeon: Nelva Bush, MD;  Location: [facility];  Service: Cardiovascular;; pLAD 70%, ostD1 50%.  mCx 60%, OM2 80%. pRCA 95% & mRCA 100% - rPDA 70%. LIMA-mLAD patent. SVG-rPDA 10% ISR.   Marland Kitchen CYSTOSCOPY WITH URETHRAL DILATATION    . IR ANGIO INTRA EXTRACRAN SEL COM CAROTID INNOMINATE BILAT MOD SED  02/25/2017  . IR ANGIO INTRA EXTRACRAN SEL COM CAROTID INNOMINATE BILAT MOD SED  10/19/2017   Dr. Kathee Delton: L Common Carotid - ECA & major branches widely patent. ICA  ~20% distal to bulb & 50% in supraclinoid segment. LMCA-distal 1/3 MI ~905 stenosis with post-stenotic dilation into inferior division. ~50% prox Basilar A stenosis @ anterior Inf Cerebellar A. 50% R ICA  . IR ANGIO VERTEBRAL SEL SUBCLAVIAN INNOMINATE UNI L MOD SED  02/25/2017  . IR ANGIO VERTEBRAL  SEL SUBCLAVIAN INNOMINATE UNI R MOD SED  10/19/2017  . IR ANGIO VERTEBRAL SEL VERTEBRAL UNI L MOD SED  10/19/2017  . IR GENERIC HISTORICAL  01/21/2016   IR RADIOLOGIST EVAL & MGMT 01/21/2016 [facility]  . IR GENERIC HISTORICAL  02/03/2016   IR CATHETER TUBE CHANGE 02/03/2016 Marybelle Killings, MD [facility]  . IR RADIOLOGIST EVAL & MGMT  11/09/2017  . LEFT HEART CATHETERIZATION WITH CORONARY ANGIOGRAM N/A 06/18/2014   Procedure: LEFT HEART CATHETERIZATION WITH CORONARY ANGIOGRAM;  Surgeon: Leonie Man, MD;  Location: [facility] CATH LAB;  Service: Cardiovascular;  -- severe disease of SVG-rPDA  . RADIOLOGY WITH ANESTHESIA N/A 01/24/2015   Procedure: STENT ASSISTED ANGIOPLASTY (RADIOLOGY WITH ANESTHESIA);  Surgeon: Luanne Bras, MD;  Location: [facility];  Service: Radiology;  Laterality: N/A;  . TONSILLECTOMY    . TRANSTHORACIC ECHOCARDIOGRAM  3/'14; 11/'16:11/'17   a. EF 55-60%, severe Conc LVH, Nl Systolic fxn, G1 DD,  Ao Sclerosis w/o stenosis; CRO PFO;; b. Moderate concentric LVH. EF 60-65%. No RWMA, Gr 1 DD. Mild LA dilation;; c) HK of the distal inferior wall. EF 60-65%.  GR 1 DD. Ascending aorta/root 45-46 mm.  . TRANSTHORACIC ECHOCARDIOGRAM  08/2017    EF 60-65%. Mild LVH. No RWMA. Gr 1 DD. Mod LA dilation.    Social History   Occupational History    Employer: RETIRED  Tobacco Use  . Smoking status: Former Smoker    Packs/day: 0.33    Years: 10.00    Pack years: 3.30    Types: Cigarettes    Last attempt to quit: 1961    Years since quitting: 58.8  . Smokeless tobacco: Never Used  Substance and Sexual Activity  . Alcohol use: No  . Drug use: No  . Sexual activity: Never

## 2018-02-23 DIAGNOSIS — R195 Other fecal abnormalities: Secondary | ICD-10-CM | POA: Diagnosis not present

## 2018-02-23 DIAGNOSIS — D500 Iron deficiency anemia secondary to blood loss (chronic): Secondary | ICD-10-CM | POA: Diagnosis not present

## 2018-02-23 DIAGNOSIS — K5730 Diverticulosis of large intestine without perforation or abscess without bleeding: Secondary | ICD-10-CM | POA: Diagnosis not present

## 2018-02-23 DIAGNOSIS — Z538 Procedure and treatment not carried out for other reasons: Secondary | ICD-10-CM | POA: Diagnosis not present

## 2018-02-23 DIAGNOSIS — Z9119 Patient's noncompliance with other medical treatment and regimen: Secondary | ICD-10-CM | POA: Diagnosis not present

## 2018-02-24 ENCOUNTER — Other Ambulatory Visit: Payer: Self-pay | Admitting: Unknown Physician Specialty

## 2018-02-24 DIAGNOSIS — D508 Other iron deficiency anemias: Secondary | ICD-10-CM

## 2018-03-01 ENCOUNTER — Ambulatory Visit: Payer: Medicare Other

## 2018-03-01 NOTE — Progress Notes (Deleted)
03/01/2018 SENAI KINGSLEY 04-Dec-1930 [id number]   HPI:  [name] is a 84 y.o. male patient of Dr Ellyn Hack, with a PMH below who presents today for hypertension clinic evaluation.  In addition to hypertension, his medical history is significant for CAD (CABG x 2, DES x 2 to SVG-PDA in 2016 and 2017), CVA (90% left MCA stenosis), DM2 (A1c 8.2), atrial fibrillation, hyperlipidemia, bradycardia, CKD stage 3  Blood Pressure Goal:  130/80  Current Medications:  Family Hx:  Social Hx:  Diet:  Exercise:  Home BP readings:  Intolerances:   Labs:  Wt Readings from Last 3 Encounters:  02/15/18 215 lb (97.5 kg)  01/28/18 214 lb (97.1 kg)  01/11/18 217 lb 3.2 oz (98.5 kg)   BP Readings from Last 3 Encounters:  02/15/18 (!) 156/64  01/28/18 (!) 150/75  01/11/18 (!) 162/62   Pulse Readings from Last 3 Encounters:  02/15/18 (!) 53  01/28/18 (!) 56  01/11/18 (!) 57    Current Outpatient Medications  Medication Sig Dispense Refill  . amLODipine (NORVASC) 10 MG tablet Take 10 mg by mouth every evening.     Marland Kitchen aspirin 81 MG chewable tablet Chew 1 tablet (81 mg total) by mouth daily. (Patient taking differently: Chew 81 mg by mouth every morning. )    . carvedilol (COREG) 12.5 MG tablet Take 1/2 tablet ( 6.25 mg ) twice a day 30 tablet 6  . clopidogrel (PLAVIX) 75 MG tablet Take 1 tablet (75 mg total) by mouth daily. (Patient taking differently: Take 75 mg by mouth every morning. ) 90 tablet 3  . furosemide (LASIX) 20 MG tablet Take 20 mg by mouth as directed.    Marland Kitchen glimepiride (AMARYL) 4 MG tablet Take 4 mg by mouth 2 (two) times daily with a meal.     . hydrALAZINE (APRESOLINE) 100 MG tablet Take 1 tablet (100 mg total) by mouth 3 (three) times daily. 270 tablet 3  . isosorbide mononitrate (IMDUR) 60 MG 24 hr tablet Take 1 tablet (60 mg total) by mouth 2 (two) times daily. 60 tablet 0  . losartan-hydrochlorothiazide (HYZAAR) 100-25 MG tablet Take 1 tablet by mouth every  evening. 90 tablet 3  . metFORMIN (GLUCOPHAGE) 1000 MG tablet Take 1 tablet (1,000 mg total) by mouth 2 (two) times daily.    . nitroGLYCERIN (NITROSTAT) 0.4 MG SL tablet Place 1 tablet (0.4 mg total) under the tongue every 5 (five) minutes as needed for chest pain (up to 3 doses). 25 tablet 3  . oxybutynin (DITROPAN) 5 MG tablet Take 5 mg by mouth daily as needed for bladder spasms.      No current facility-administered medications for this visit.     Allergies  Allergen Reactions  . Other Other (See Comments)    No  BLOOD PRODUCTS - Pt is Jehovah's Witness  . Statins Other (See Comments)    Cause muscle aches  . Atorvastatin Other (See Comments)    Myalgias  . Crestor [Rosuvastatin] Other (See Comments)    Myalgias   . Pravastatin Other (See Comments)    Myalgias  . Simvastatin Other (See Comments)    Myalgias     Past Medical History:  Diagnosis Date  . Abdominal aortic aneurysm ([place])    a. Aortic duplex 06/2014: mild aneurysmal dilatation of proximal abdominal aorta measuring 3.4x3.4cm. No sig change from 2012. F/u due 06/2016;   . AKI (acute kidney injury) ([place]) 03/28/2016  . Arthritis    "right  knee; never bothered me" (03/26/2016)  . 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 CAD in native artery    a. NSTEMI 11/2010 - CABG x2(LIMA to LAD, SVG to PDA). b. NEG Lexi MV 10/24/13, EF 53%, no perfusion abnormality, septal and apical HK noted. c. NSTEMI 05/2014 - s/p DES to SVG-RPDA 06/18/14 (Xience Alpine DES 3.0 x 18 mm -3.35 mm), EF 60-65; d. 03/2016 STEMI/PCI: LM nl, ost LAD 70%, mLCx 50%, pRCA 95% - mRCA 60%, dRPDA 70%, LIMA->LAD ok, o-p SVG->RPDA 100% (3.0x16 Promus DES overlapping prior stent).  . Diastolic dysfunction    a. 03/2016 Echo: EF 60-65%, no rwma, Gr1 DD, triv AI, Ao root 59mm, Asc Ao 22mm, triv MR.  . Dizziness    a. Carotid duplex 03/2014: mild fibrous plaque, no significant stenosis.  . Foley catheter in place    "been wearing  it for a couple months now" (03/26/2016)  . HTN (hypertension)   . Hyperlipidemia LDL goal <70   . Non-STEMI (non-ST elevated myocardial infarction) ([place]) 11/2010   1. Ostial LAD 70% (to close to [facility] for PCI), subtotal occlusion of the RCA  . Postoperative atrial fibrillation ([place]) 11/2010   Post CABG, no sign recurrence  . Refusal of blood transfusions as patient is Jehovah's Witness   . 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 Servando Snare)  . Sleep apnea    Not on CPAP. (03/26/2016)  . ST elevation myocardial infarction (STEMI) of inferior wall ([place]) 04/05/2016   Occluded in-stent restenosis/thrombosis of SVG-RCA --> treated with overlapping Promus DES 3.0 mm 16 mm postdilated 3.0 mm).  . Stroke [facility]) 2014; 01/2015   a. 2014 with mild right hand weakness, nonhemorrhagic per pt.;; b. - PTA-Stent L ICA 95%   . Type II diabetes mellitus ([place])     There were no vitals taken for this visit.  No problem-specific Assessment & Plan notes found for this encounter.   Tommy Medal PharmD CPP [facility] [address] [place], [place] [address] [phone]

## 2018-03-10 ENCOUNTER — Ambulatory Visit
Admission: RE | Admit: 2018-03-10 | Discharge: 2018-03-10 | Disposition: A | Discharge status: Home / Self Care | Payer: Medicare Other | Source: Ambulatory Visit | Attending: Unknown Physician Specialty | Admitting: Unknown Physician Specialty

## 2018-03-10 DIAGNOSIS — D508 Other iron deficiency anemias: Secondary | ICD-10-CM | POA: Diagnosis not present

## 2018-03-16 DIAGNOSIS — R3914 Feeling of incomplete bladder emptying: Secondary | ICD-10-CM | POA: Diagnosis not present

## 2018-03-18 DIAGNOSIS — D649 Anemia, unspecified: Secondary | ICD-10-CM | POA: Diagnosis not present

## 2018-03-24 DIAGNOSIS — D649 Anemia, unspecified: Secondary | ICD-10-CM | POA: Diagnosis not present

## 2018-03-24 DIAGNOSIS — R195 Other fecal abnormalities: Secondary | ICD-10-CM | POA: Diagnosis not present

## 2018-03-24 DIAGNOSIS — K2901 Acute gastritis with bleeding: Secondary | ICD-10-CM | POA: Diagnosis not present

## 2018-03-24 DIAGNOSIS — Z791 Long term (current) use of non-steroidal anti-inflammatories (NSAID): Secondary | ICD-10-CM | POA: Diagnosis not present

## 2018-03-24 DIAGNOSIS — Z1212 Encounter for screening for malignant neoplasm of rectum: Secondary | ICD-10-CM | POA: Diagnosis not present

## 2018-04-04 ENCOUNTER — Encounter (INDEPENDENT_AMBULATORY_CARE_PROVIDER_SITE_OTHER): Payer: Self-pay | Admitting: Orthopedic Surgery

## 2018-04-04 ENCOUNTER — Ambulatory Visit (INDEPENDENT_AMBULATORY_CARE_PROVIDER_SITE_OTHER): Payer: Medicare Other | Admitting: Orthopedic Surgery

## 2018-04-04 VITALS — Ht 73.0 in | Wt 215.0 lb

## 2018-04-04 DIAGNOSIS — M1711 Unilateral primary osteoarthritis, right knee: Secondary | ICD-10-CM

## 2018-04-04 DIAGNOSIS — I200 Unstable angina: Secondary | ICD-10-CM | POA: Diagnosis not present

## 2018-04-04 MED ORDER — LIDOCAINE HCL 1 % IJ SOLN
5.0000 mL | INTRAMUSCULAR | Status: AC | PRN
  Administered 2018-04-04: 5 mL

## 2018-04-04 MED ORDER — METHYLPREDNISOLONE ACETATE 40 MG/ML IJ SUSP
40.0000 mg | INTRAMUSCULAR | Status: AC | PRN
  Administered 2018-04-04: 40 mg via INTRA_ARTICULAR

## 2018-04-04 NOTE — Progress Notes (Signed)
Office Visit Note   Patient: [name]           Date of Birth: 1931-01-31           MRN: [id number] Visit Date: 04/04/2018              Requested by: Suzan Garibaldi, [address] [place], [place] [address] PCP: Suzan Garibaldi, FNP  Chief Complaint  Patient presents with  . Right Knee - Follow-up    S/p injection 4 weeks ago.       HPI: Patient is an 84 year old gentleman who presents in follow-up for osteoarthritis right knee.  Patient states he had good temporary relief with the steroid injection he was able to go up stairs without difficulty.  Patient states the shot only lasted about a week.  Patient states that he does have to take care of his wife and his mother-in-law at home.  Assessment & Plan: Visit Diagnoses:  1. Unilateral primary osteoarthritis, right knee     Plan: Repeat injection today follow-up in 4 weeks.  Discussed that we could proceed with 3 injections.  Discussed that arthroscopy probably would not provide him much relief and discussed that a total knee arthroplasty is an option but he would have to get somebody to care for he and his wife and mother-in-law at home postoperatively.  Follow-Up Instructions: Return in about 4 weeks (around 05/02/2018).   Ortho Exam  Patient is alert, oriented, no adenopathy, well-dressed, normal affect, normal respiratory effort. Examination patient has an antalgic gait he has no effusion he is tender to palpation of the medial and lateral joint lines as well as the patellofemoral joint.  Collaterals and cruciates are stable he has varus alignment.  Radiographs shows tricompartmental arthritic changes with bone-on-bone contact medially with varus alignment with joint space subluxation.  Imaging: No results found. No images are attached to the encounter.  Labs: Lab Results  Component Value Date   HGBA1C 7.1 (H) 03/26/2016   HGBA1C 7.2 (H) 03/26/2016   HGBA1C 7.8 (H) 01/22/2015   LABURIC 8.3 12/18/2017   REPTSTATUS 12/21/2017 FINAL 12/18/2017   GRAMSTAIN  12/18/2017    FEW WBC PRESENT, PREDOMINANTLY PMN NO ORGANISMS SEEN    CULT  12/18/2017    NO GROWTH 2 DAYS Performed at [facility], [place] [address], [place], [place] [address]    LABORGA PSEUDOMONAS AERUGINOSA (A) 12/18/2017     Lab Results  Component Value Date   ALBUMIN 3.6 12/18/2017   ALBUMIN 3.7 12/13/2017   ALBUMIN 4.0 01/11/2017   LABURIC 8.3 12/18/2017    Body mass index is 28.37 kg/m.  Orders:  Orders Placed This Encounter  Procedures  . Large Joint Inj   No orders of the defined types were placed in this encounter.    Procedures: Large Joint Inj: R knee on 04/04/2018 10:31 AM Indications: pain and diagnostic evaluation Details: 22 G 1.5 in needle, anteromedial approach  Arthrogram: No  Medications: 5 mL lidocaine 1 %; 40 mg methylPREDNISolone acetate 40 MG/ML Outcome: tolerated well, no immediate complications Procedure, treatment alternatives, risks and benefits explained, specific risks discussed. Consent was given by the patient. Immediately prior to procedure a time out was called to verify the correct patient, procedure, equipment, support staff and site/side marked as required. Patient was prepped and draped in the usual sterile fashion.      Clinical Data: No additional findings.  ROS:  All other systems negative, except as noted in the HPI. Review  of Systems  Objective: Vital Signs: Ht 6\' 1"  (1.854 m)   Wt 215 lb (97.5 kg)   BMI 28.37 kg/m   Specialty Comments:  No specialty comments available.  PMFS History: Patient Active Problem List   Diagnosis Date Noted  . Chest pain 01/28/2018  . Pseudomonas urinary tract infection   . Pure hypercholesterolemia   . Elevated troponin   . Hydrocele, right   . Olecranon bursitis of left elbow   . Chronic renal insufficiency, stage III (moderate) (HCC)   . Coronary artery disease due to calcified coronary lesion   . Suprapubic  catheter ([place])   . History of urethral stricture   . Sepsis due to Pseudomonas aeruginosa ([place])   . Acute pyelonephritis   . Sepsis ([place]) 12/18/2017  . Hemispheric carotid artery syndrome 12/22/2016  . AKI (acute kidney injury) ([place]) 03/28/2016  . S/P angioplasty with stent 03/26/16 to VG to PDA for in-stent restenosis with DES. 03/28/2016  . History of ST elevation myocardial infarction (STEMI) 03/28/2016  . Carpal tunnel syndrome of right wrist 06/24/2015  . History of stroke 03/21/2015  . Diabetes mellitus treated with oral medication ([place]) 03/21/2015  . Stenosis of left carotid artery   . Essential hypertension   . Hyperlipidemia LDL goal <70   . Cerebral infarction due to stenosis of left middle cerebral artery ([place]) 01/22/2015  . CAD S/P percutaneous coronary angioplasty 09/19/2014  . Coronary artery disease involving coronary bypass graft of native heart with other forms of angina pectoris ([place]) 09/19/2014  . Shortness of breath 09/19/2014  . Polypharmacy 09/19/2014  . Abdominal aortic aneurysm ([place])   . Stroke ([place])   . Hyperlipidemia associated with type 2 diabetes mellitus ([place])   . Type II diabetes mellitus with complication ([place])   . Coronary artery disease involving native coronary artery of native heart with angina pectoris ([place])   . Abnormal TSH 06/19/2014  . Obesity 06/19/2014  . STEMI (ST elevation myocardial infarction) ([place]) 06/18/2014  . Bilateral lower extremity edema 03/30/2014  . Dizziness 02/21/2014  . Bradycardia 08/31/2013  . Postoperative atrial fibrillation (HCC)     Class: Temporary  . Diabetes mellitus type 2 in obese ([place])   . Moderate essential hypertension   . Apnea, sleep   . Balanitis xerotica obliterans   . History of: Non-STEMI (non-ST elevated myocardial infarction) 11/16/2010    Class: History of  . S/P CABG x 2 11/16/2010   Past Medical History:  Diagnosis Date  . Abdominal aortic aneurysm ([place])    a. Aortic duplex 06/2014: mild  aneurysmal dilatation of proximal abdominal aorta measuring 3.4x3.4cm. No sig change from 2012. F/u due 06/2016;   . AKI (acute kidney injury) ([place]) 03/28/2016  . Arthritis    "right knee; never bothered me" (03/26/2016)  . 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 CAD in native artery    a. NSTEMI 11/2010 - CABG x2(LIMA to LAD, SVG to PDA). b. NEG Lexi MV 10/24/13, EF 53%, no perfusion abnormality, septal and apical HK noted. c. NSTEMI 05/2014 - s/p DES to SVG-RPDA 06/18/14 (Xience Alpine DES 3.0 x 18 mm -3.35 mm), EF 60-65; d. 03/2016 STEMI/PCI: LM nl, ost LAD 70%, mLCx 50%, pRCA 95% - mRCA 60%, dRPDA 70%, LIMA->LAD ok, o-p SVG->RPDA 100% (3.0x16 Promus DES overlapping prior stent).  . Diastolic dysfunction    a. 03/2016 Echo: EF 60-65%, no rwma, Gr1 DD, triv AI, Ao root 26mm, Asc Ao 57mm, triv MR.  Marland Kitchen  Dizziness    a. Carotid duplex 03/2014: mild fibrous plaque, no significant stenosis.  . Foley catheter in place    "been wearing it for a couple months now" (03/26/2016)  . HTN (hypertension)   . Hyperlipidemia LDL goal <70   . Non-STEMI (non-ST elevated myocardial infarction) ([place]) 11/2010   1. Ostial LAD 70% (to close to [employer] for PCI), subtotal occlusion of the RCA  . Postoperative atrial fibrillation ([place]) 11/2010   Post CABG, no sign recurrence  . Refusal of blood transfusions as patient is Jehovah's Witness   . 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 Servando Snare)  . Sleep apnea    Not on CPAP. (03/26/2016)  . ST elevation myocardial infarction (STEMI) of inferior wall ([place]) 04/05/2016   Occluded in-stent restenosis/thrombosis of SVG-RCA --> treated with overlapping Promus DES 3.0 mm 16 mm postdilated 3.0 mm).  . Stroke [facility]) 2014; 01/2015   a. 2014 with mild right hand weakness, nonhemorrhagic per pt.;; b. - PTA-Stent L ICA 95%   . Type II diabetes mellitus (HCC)     Family History  Problem Relation Age of Onset  . Heart Problems Father 34      Past Surgical History:  Procedure Laterality Date  . APPENDECTOMY    . CARDIAC CATHETERIZATION  12/11/2010   Dr. Chase Picket - subsequent cath - normal LV systolic function, no renal artery stenosis, severe 2-vessel disease with subtotaled RCA prox and distal 60% lesion and complex 70% area of narrowing of ostium of LAD  . CARDIAC CATHETERIZATION N/A 03/26/2016   Procedure: Left Heart Cath and Coronary Angiography;  Surgeon: Nelva Bush, MD;  Location: [place] [facility];  Service: Cardiovascular;  Laterality: N/A;  . CARDIAC CATHETERIZATION N/A 03/26/2016   Procedure: Coronary Stent Intervention;  Surgeon: Nelva Bush, MD;  Location: [place] [facility];  Service: Cardiovascular: 100% In-stent thrombosis of pros SVG-RCA (Xience DES) --> treated with PromusDES 3.0 x 18 (3.3 mm)  . CARDIAC CATHETERIZATION N/A 03/26/2016   Procedure: Bypass Graft Angiography;  Surgeon: Nelva Bush, MD;  Location: [place] [facility];  Service: Cardiovascular;  Laterality: N/A;  . CORONARY ANGIOPLASTY WITH STENT PLACEMENT  06/18/2014   PCI to SVG-RPDA 06/18/14 (Xience Alpine DES 3.0 x 18 mm -3.35 mm),   . CORONARY ARTERY BYPASS GRAFT  12/15/2010   X2, Dr Servando Snare; LIMA to LAD, SVG to PDA;   . CORONARY/GRAFT ANGIOGRAPHY N/A 08/27/2017   Procedure: CORONARY/GRAFT ANGIOGRAPHY;  Surgeon: Nelva Bush, MD;  Location: [facility];  Service: Cardiovascular;; pLAD 70%, ostD1 50%.  mCx 60%, OM2 80%. pRCA 95% & mRCA 100% - rPDA 70%. LIMA-mLAD patent. SVG-rPDA 10% ISR.   Marland Kitchen CYSTOSCOPY WITH URETHRAL DILATATION    . IR ANGIO INTRA EXTRACRAN SEL COM CAROTID INNOMINATE BILAT MOD SED  02/25/2017  . IR ANGIO INTRA EXTRACRAN SEL COM CAROTID INNOMINATE BILAT MOD SED  10/19/2017   Dr. Kathee Delton: L Common Carotid - ECA & major branches widely patent. ICA ~20% distal to bulb & 50% in supraclinoid segment. LMCA-distal 1/3 MI ~905 stenosis with post-stenotic dilation into inferior division. ~50% prox Basilar A stenosis @  anterior Inf Cerebellar A. 50% R ICA  . IR ANGIO VERTEBRAL SEL SUBCLAVIAN INNOMINATE UNI L MOD SED  02/25/2017  . IR ANGIO VERTEBRAL SEL SUBCLAVIAN INNOMINATE UNI R MOD SED  10/19/2017  . IR ANGIO VERTEBRAL SEL VERTEBRAL UNI L MOD SED  10/19/2017  . IR GENERIC HISTORICAL  01/21/2016  IR RADIOLOGIST EVAL & MGMT 01/21/2016 [facility]  . IR GENERIC HISTORICAL  02/03/2016   IR CATHETER TUBE CHANGE 02/03/2016 Marybelle Killings, MD [facility]  . IR RADIOLOGIST EVAL & MGMT  11/09/2017  . LEFT HEART CATHETERIZATION WITH CORONARY ANGIOGRAM N/A 06/18/2014   Procedure: LEFT HEART CATHETERIZATION WITH CORONARY ANGIOGRAM;  Surgeon: Leonie Man, MD;  Location[facility];  Service: Cardiovascular;  -- severe disease of SVG-rPDA  . RADIOLOGY WITH ANESTHESIA N/A 01/24/2015   Procedure: STENT ASSISTED ANGIOPLASTY (RADIOLOGY WITH ANESTHESIA);  Surgeon: Luanne Bras, MD;  Location: [facility];  Service: Radiology;  Laterality: N/A;  . TONSILLECTOMY    . TRANSTHORACIC ECHOCARDIOGRAM  3/'14; 11/'16:11/'17   a. EF 55-60%, severe Conc LVH, Nl Systolic fxn, G1 DD,  Ao Sclerosis w/o stenosis; CRO PFO;; b. Moderate concentric LVH. EF 60-65%. No RWMA, Gr 1 DD. Mild LA dilation;; c) HK of the distal inferior wall. EF 60-65%.  GR 1 DD. Ascending aorta/root 45-46 mm.  . TRANSTHORACIC ECHOCARDIOGRAM  08/2017    EF 60-65%. Mild LVH. No RWMA. Gr 1 DD. Mod LA dilation.    Social History   Occupational History    Employer: RETIRED  Tobacco Use  . Smoking status: Former Smoker    Packs/day: 0.33    Years: 10.00    Pack years: 3.30    Types: Cigarettes    Last attempt to quit: 1961    Years since quitting: 58.9  . Smokeless tobacco: Never Used  Substance and Sexual Activity  . Alcohol use: No  . Drug use: No  . Sexual activity: Never

## 2018-04-16 ENCOUNTER — Other Ambulatory Visit: Payer: Self-pay

## 2018-04-16 ENCOUNTER — Encounter (HOSPITAL_COMMUNITY): Payer: Self-pay | Admitting: Emergency Medicine

## 2018-04-16 ENCOUNTER — Emergency Department (HOSPITAL_COMMUNITY): Payer: Medicare Other

## 2018-04-16 ENCOUNTER — Inpatient Hospital Stay (HOSPITAL_COMMUNITY)
Admission: EM | Admit: 2018-04-16 | Discharge: 2018-04-21 | DRG: 250 | Disposition: A | Discharge status: Home / Self Care | Payer: Medicare Other | Attending: Internal Medicine | Admitting: Internal Medicine

## 2018-04-16 ENCOUNTER — Emergency Department (HOSPITAL_COMMUNITY)
Admission: EM | Admit: 2018-04-16 | Discharge: 2018-04-16 | Disposition: A | Discharge status: Home / Self Care | Payer: Medicare Other | Source: Home / Self Care | Attending: Emergency Medicine | Admitting: Emergency Medicine

## 2018-04-16 DIAGNOSIS — Z531 Procedure and treatment not carried out because of patient's decision for reasons of belief and group pressure: Secondary | ICD-10-CM | POA: Diagnosis present

## 2018-04-16 DIAGNOSIS — T83090A Other mechanical complication of cystostomy catheter, initial encounter: Secondary | ICD-10-CM

## 2018-04-16 DIAGNOSIS — T82855A Stenosis of coronary artery stent, initial encounter: Secondary | ICD-10-CM | POA: Diagnosis not present

## 2018-04-16 DIAGNOSIS — D72829 Elevated white blood cell count, unspecified: Secondary | ICD-10-CM

## 2018-04-16 DIAGNOSIS — I5043 Acute on chronic combined systolic (congestive) and diastolic (congestive) heart failure: Secondary | ICD-10-CM | POA: Diagnosis not present

## 2018-04-16 DIAGNOSIS — N183 Chronic kidney disease, stage 3 unspecified: Secondary | ICD-10-CM

## 2018-04-16 DIAGNOSIS — Z7984 Long term (current) use of oral hypoglycemic drugs: Secondary | ICD-10-CM

## 2018-04-16 DIAGNOSIS — I214 Non-ST elevation (NSTEMI) myocardial infarction: Principal | ICD-10-CM | POA: Diagnosis present

## 2018-04-16 DIAGNOSIS — I130 Hypertensive heart and chronic kidney disease with heart failure and stage 1 through stage 4 chronic kidney disease, or unspecified chronic kidney disease: Secondary | ICD-10-CM | POA: Diagnosis present

## 2018-04-16 DIAGNOSIS — Z87891 Personal history of nicotine dependence: Secondary | ICD-10-CM

## 2018-04-16 DIAGNOSIS — Z955 Presence of coronary angioplasty implant and graft: Secondary | ICD-10-CM

## 2018-04-16 DIAGNOSIS — Z87440 Personal history of urinary (tract) infections: Secondary | ICD-10-CM

## 2018-04-16 DIAGNOSIS — D649 Anemia, unspecified: Secondary | ICD-10-CM

## 2018-04-16 DIAGNOSIS — I714 Abdominal aortic aneurysm, without rupture: Secondary | ICD-10-CM | POA: Diagnosis present

## 2018-04-16 DIAGNOSIS — I440 Atrioventricular block, first degree: Secondary | ICD-10-CM | POA: Diagnosis present

## 2018-04-16 DIAGNOSIS — Z7982 Long term (current) use of aspirin: Secondary | ICD-10-CM

## 2018-04-16 DIAGNOSIS — Z9889 Other specified postprocedural states: Secondary | ICD-10-CM

## 2018-04-16 DIAGNOSIS — I5042 Chronic combined systolic (congestive) and diastolic (congestive) heart failure: Secondary | ICD-10-CM | POA: Diagnosis present

## 2018-04-16 DIAGNOSIS — Z79899 Other long term (current) drug therapy: Secondary | ICD-10-CM

## 2018-04-16 DIAGNOSIS — N179 Acute kidney failure, unspecified: Secondary | ICD-10-CM | POA: Diagnosis present

## 2018-04-16 DIAGNOSIS — Z9861 Coronary angioplasty status: Secondary | ICD-10-CM

## 2018-04-16 DIAGNOSIS — Z7902 Long term (current) use of antithrombotics/antiplatelets: Secondary | ICD-10-CM

## 2018-04-16 DIAGNOSIS — I160 Hypertensive urgency: Secondary | ICD-10-CM | POA: Diagnosis present

## 2018-04-16 DIAGNOSIS — I472 Ventricular tachycardia: Secondary | ICD-10-CM | POA: Diagnosis present

## 2018-04-16 DIAGNOSIS — T83091A Other mechanical complication of indwelling urethral catheter, initial encounter: Secondary | ICD-10-CM | POA: Diagnosis not present

## 2018-04-16 DIAGNOSIS — I499 Cardiac arrhythmia, unspecified: Secondary | ICD-10-CM | POA: Diagnosis not present

## 2018-04-16 DIAGNOSIS — R0602 Shortness of breath: Secondary | ICD-10-CM | POA: Diagnosis not present

## 2018-04-16 DIAGNOSIS — I5032 Chronic diastolic (congestive) heart failure: Secondary | ICD-10-CM

## 2018-04-16 DIAGNOSIS — R310 Gross hematuria: Secondary | ICD-10-CM | POA: Diagnosis present

## 2018-04-16 DIAGNOSIS — E669 Obesity, unspecified: Secondary | ICD-10-CM | POA: Diagnosis present

## 2018-04-16 DIAGNOSIS — E1169 Type 2 diabetes mellitus with other specified complication: Secondary | ICD-10-CM | POA: Diagnosis present

## 2018-04-16 DIAGNOSIS — E1122 Type 2 diabetes mellitus with diabetic chronic kidney disease: Secondary | ICD-10-CM | POA: Diagnosis present

## 2018-04-16 DIAGNOSIS — I25709 Atherosclerosis of coronary artery bypass graft(s), unspecified, with unspecified angina pectoris: Secondary | ICD-10-CM

## 2018-04-16 DIAGNOSIS — I10 Essential (primary) hypertension: Secondary | ICD-10-CM | POA: Diagnosis not present

## 2018-04-16 DIAGNOSIS — D631 Anemia in chronic kidney disease: Secondary | ICD-10-CM | POA: Diagnosis present

## 2018-04-16 DIAGNOSIS — R0789 Other chest pain: Secondary | ICD-10-CM | POA: Diagnosis not present

## 2018-04-16 DIAGNOSIS — Z6828 Body mass index (BMI) 28.0-28.9, adult: Secondary | ICD-10-CM

## 2018-04-16 DIAGNOSIS — I5030 Unspecified diastolic (congestive) heart failure: Secondary | ICD-10-CM

## 2018-04-16 DIAGNOSIS — R079 Chest pain, unspecified: Secondary | ICD-10-CM | POA: Diagnosis not present

## 2018-04-16 DIAGNOSIS — J9811 Atelectasis: Secondary | ICD-10-CM | POA: Diagnosis present

## 2018-04-16 DIAGNOSIS — R0902 Hypoxemia: Secondary | ICD-10-CM | POA: Diagnosis not present

## 2018-04-16 DIAGNOSIS — Z888 Allergy status to other drugs, medicaments and biological substances status: Secondary | ICD-10-CM

## 2018-04-16 DIAGNOSIS — G4733 Obstructive sleep apnea (adult) (pediatric): Secondary | ICD-10-CM | POA: Diagnosis present

## 2018-04-16 DIAGNOSIS — M1711 Unilateral primary osteoarthritis, right knee: Secondary | ICD-10-CM | POA: Diagnosis present

## 2018-04-16 DIAGNOSIS — E785 Hyperlipidemia, unspecified: Secondary | ICD-10-CM | POA: Diagnosis present

## 2018-04-16 DIAGNOSIS — Y738 Miscellaneous gastroenterology and urology devices associated with adverse incidents, not elsewhere classified: Secondary | ICD-10-CM | POA: Diagnosis present

## 2018-04-16 DIAGNOSIS — I252 Old myocardial infarction: Secondary | ICD-10-CM

## 2018-04-16 DIAGNOSIS — I25700 Atherosclerosis of coronary artery bypass graft(s), unspecified, with unstable angina pectoris: Secondary | ICD-10-CM | POA: Diagnosis present

## 2018-04-16 DIAGNOSIS — I110 Hypertensive heart disease with heart failure: Secondary | ICD-10-CM

## 2018-04-16 DIAGNOSIS — Y831 Surgical operation with implant of artificial internal device as the cause of abnormal reaction of the patient, or of later complication, without mention of misadventure at the time of the procedure: Secondary | ICD-10-CM | POA: Diagnosis present

## 2018-04-16 DIAGNOSIS — Z8673 Personal history of transient ischemic attack (TIA), and cerebral infarction without residual deficits: Secondary | ICD-10-CM

## 2018-04-16 LAB — BASIC METABOLIC PANEL
Anion gap: 10 (ref 5–15)
BUN: 30 mg/dL — ABNORMAL HIGH (ref 8–23)
CHLORIDE: 103 mmol/L (ref 98–111)
CO2: 25 mmol/L (ref 22–32)
Calcium: 9.2 mg/dL (ref 8.9–10.3)
Creatinine, Ser: 1.51 mg/dL — ABNORMAL HIGH (ref 0.61–1.24)
GFR calc Af Amer: 48 mL/min — ABNORMAL LOW (ref >60)
GFR calc non Af Amer: 42 mL/min — ABNORMAL LOW (ref >60)
Glucose, Bld: 176 mg/dL — ABNORMAL HIGH (ref 70–99)
Potassium: 4.2 mmol/L (ref 3.5–5.1)
Sodium: 138 mmol/L (ref 135–145)

## 2018-04-16 LAB — CBC
HEMATOCRIT: 31.5 % — AB (ref 39.0–52.0)
Hemoglobin: 9.4 g/dL — ABNORMAL LOW (ref 13.0–17.0)
MCH: 26.8 pg (ref 26.0–34.0)
MCHC: 29.8 g/dL — ABNORMAL LOW (ref 30.0–36.0)
MCV: 89.7 fL (ref 80.0–100.0)
Platelets: 329 10*3/uL (ref 150–400)
RBC: 3.51 MIL/uL — ABNORMAL LOW (ref 4.22–5.81)
RDW: 18.6 % — ABNORMAL HIGH (ref 11.5–15.5)
WBC: 12.2 10*3/uL — ABNORMAL HIGH (ref 4.0–10.5)
nRBC: 0 % (ref 0.0–0.2)

## 2018-04-16 LAB — I-STAT TROPONIN, ED: Troponin i, poc: 1.1 ng/mL (ref 0.00–0.08)

## 2018-04-16 LAB — TROPONIN I: Troponin I: 1.07 ng/mL (ref <0.03)

## 2018-04-16 MED ORDER — NITROGLYCERIN 0.4 MG SL SUBL
0.4000 mg | SUBLINGUAL_TABLET | SUBLINGUAL | Status: AC | PRN
  Administered 2018-04-17 (×3): 0.4 mg via SUBLINGUAL
  Filled 2018-04-16 (×4): qty 1

## 2018-04-16 MED ORDER — HEPARIN BOLUS VIA INFUSION
4000.0000 [IU] | Freq: Once | INTRAVENOUS | Status: AC
  Administered 2018-04-16: 4000 [IU] via INTRAVENOUS
  Filled 2018-04-16: qty 4000

## 2018-04-16 MED ORDER — HEPARIN (PORCINE) 25000 UT/250ML-% IV SOLN
1500.0000 [IU]/h | INTRAVENOUS | Status: DC
  Administered 2018-04-16: 1200 [IU]/h via INTRAVENOUS
  Administered 2018-04-17: 1300 [IU]/h via INTRAVENOUS
  Administered 2018-04-18: 1500 [IU]/h via INTRAVENOUS
  Filled 2018-04-16 (×3): qty 250

## 2018-04-16 NOTE — Progress Notes (Signed)
ANTICOAGULATION CONSULT NOTE - Initial Consult  Pharmacy Consult for heparin Indication: chest pain/ACS  Allergies  Allergen Reactions  . Other Other (See Comments)    No  BLOOD PRODUCTS - Pt is Jehovah's Witness  . Statins Other (See Comments)    Cause muscle aches  . Atorvastatin Other (See Comments)    Myalgias  . Crestor [Rosuvastatin] Other (See Comments)    Myalgias   . Pravastatin Other (See Comments)    Myalgias  . Simvastatin Other (See Comments)    Myalgias     Patient Measurements: Height: 6\' 1"  (185.4 cm) Weight: 217 lb (98.4 kg) IBW/kg (Calculated) : 79.9 Heparin Dosing Weight: 98.4 kg  Vital Signs: Temp: 97.9 F (36.6 C) (11/30 2130) Temp Source: Oral (11/30 2130) BP: 169/69 (11/30 2130) Pulse Rate: 61 (11/30 2130)  Labs: No results for input(s): HGB, HCT, PLT, APTT, LABPROT, INR, HEPARINUNFRC, HEPRLOWMOCWT, CREATININE, CKTOTAL, CKMB, TROPONINI in the last 72 hours.  CrCl cannot be calculated (Patient's most recent lab result is older than the maximum 21 days allowed.).   Medical History: Past Medical History:  Diagnosis Date  . Abdominal aortic aneurysm ([place])    a. Aortic duplex 06/2014: mild aneurysmal dilatation of proximal abdominal aorta measuring 3.4x3.4cm. No sig change from 2012. F/u due 06/2016;   . AKI (acute kidney injury) ([place]) 03/28/2016  . Arthritis    "right knee; never bothered me" (03/26/2016)  . 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 CAD in native artery    a. NSTEMI 11/2010 - CABG x2(LIMA to LAD, SVG to PDA). b. NEG Lexi MV 10/24/13, EF 53%, no perfusion abnormality, septal and apical HK noted. c. NSTEMI 05/2014 - s/p DES to SVG-RPDA 06/18/14 (Xience Alpine DES 3.0 x 18 mm -3.35 mm), EF 60-65; d. 03/2016 STEMI/PCI: LM nl, ost LAD 70%, mLCx 50%, pRCA 95% - mRCA 60%, dRPDA 70%, LIMA->LAD ok, o-p SVG->RPDA 100% (3.0x16 Promus DES overlapping prior stent).  . Diastolic dysfunction    a. 03/2016 Echo: EF  60-65%, no rwma, Gr1 DD, triv AI, Ao root 9mm, Asc Ao 69mm, triv MR.  . Dizziness    a. Carotid duplex 03/2014: mild fibrous plaque, no significant stenosis.  . Foley catheter in place    "been wearing it for a couple months now" (03/26/2016)  . HTN (hypertension)   . Hyperlipidemia LDL goal <70   . Non-STEMI (non-ST elevated myocardial infarction) ([place]) 11/2010   1. Ostial LAD 70% (to close to [facility] for PCI), subtotal occlusion of the RCA  . Postoperative atrial fibrillation ([place]) 11/2010   Post CABG, no sign recurrence  . Refusal of blood transfusions as patient is Jehovah's Witness   . 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 Servando Snare)  . Sleep apnea    Not on CPAP. (03/26/2016)  . ST elevation myocardial infarction (STEMI) of inferior wall ([place]) 04/05/2016   Occluded in-stent restenosis/thrombosis of SVG-RCA --> treated with overlapping Promus DES 3.0 mm 16 mm postdilated 3.0 mm).  . Stroke [facility]) 2014; 01/2015   a. 2014 with mild right hand weakness, nonhemorrhagic per pt.;; b. - PTA-Stent L ICA 95%   . Type II diabetes mellitus (HCC)     Medications:  See medication history  Assessment: 84 yo man to start heparin for CP. He was not on anticoagulation PTA.   Goal of Therapy:  Heparin level 0.3-0.7 units/ml Monitor platelets by anticoagulation protocol: Yes   Plan:  Heparin 4000 unit bolus and drip at 1200 units/hr Check heparin level 6-8 hours after start Daily HL and CBC while on heparin Monitor for bleeding complications Thanks for allowing pharmacy to be a part of this patient's care.  Excell Seltzer, PharmD Clinical Pharmacist  04/16/2018,10:35 PM

## 2018-04-16 NOTE — ED Provider Notes (Signed)
[facility] Provider Note   CSN: [id number] Arrival date & time: 04/16/18  1417     History   Chief Complaint Chief Complaint  Patient presents with  . Suprapubic catheter issues    HPI [name] is a 84 y.o. male presenting for evaluation of abdominal pain.  Patient states for the past 3 hours, there is been nothing draining from his suprapubic Foley catheter.  He reports gradually increasing pain.  He was having new issues with his Foley catheter yesterday.  He denies fevers, chills, nausea, vomiting, or abnormal appearance of the urine in his Foley bag.  He has had a catheter for the past year, his current catheter was last changed last month by Dr. Diona Fanti with urology. Pain currently is constant and severe. Located in his lower abd/bladder.  HPI  Past Medical History:  Diagnosis Date  . Abdominal aortic aneurysm ([place])    a. Aortic duplex 06/2014: mild aneurysmal dilatation of proximal abdominal aorta measuring 3.4x3.4cm. No sig change from 2012. F/u due 06/2016;   . AKI (acute kidney injury) ([place]) 03/28/2016  . Arthritis    "right knee; never bothered me" (03/26/2016)  . 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 CAD in native artery    a. NSTEMI 11/2010 - CABG x2(LIMA to LAD, SVG to PDA). b. NEG Lexi MV 10/24/13, EF 53%, no perfusion abnormality, septal and apical HK noted. c. NSTEMI 05/2014 - s/p DES to SVG-RPDA 06/18/14 (Xience Alpine DES 3.0 x 18 mm -3.35 mm), EF 60-65; d. 03/2016 STEMI/PCI: LM nl, ost LAD 70%, mLCx 50%, pRCA 95% - mRCA 60%, dRPDA 70%, LIMA->LAD ok, o-p SVG->RPDA 100% (3.0x16 Promus DES overlapping prior stent).  . Diastolic dysfunction    a. 03/2016 Echo: EF 60-65%, no rwma, Gr1 DD, triv AI, Ao root 57mm, Asc Ao 35mm, triv MR.  . Dizziness    a. Carotid duplex 03/2014: mild fibrous plaque, no significant stenosis.  . Foley catheter in place    "been wearing it for a couple months  now" (03/26/2016)  . HTN (hypertension)   . Hyperlipidemia LDL goal <70   . Non-STEMI (non-ST elevated myocardial infarction) ([place]) 11/2010   1. Ostial LAD 70% (to close to [facility] for PCI), subtotal occlusion of the RCA  . Postoperative atrial fibrillation ([place]) 11/2010   Post CABG, no sign recurrence  . Refusal of blood transfusions as patient is Jehov[employer]'s Witness   . 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 Servando Snare)  . Sleep apnea    Not on CPAP. (03/26/2016)  . ST elevation myocardial infarction (STEMI) of inferior wall ([place]) 04/05/2016   Occluded in-stent restenosis/thrombosis of SVG-RCA --> treated with overlapping Promus DES 3.0 mm 16 mm postdilated 3.0 mm).  . Stroke [facility]) 2014; 01/2015   a. 2014 with mild right hand weakness, nonhemorrhagic per pt.;; b. - PTA-Stent L ICA 95%   . Type II diabetes mellitus [facility])     Patient Active Problem List   Diagnosis Date Noted  . Chest pain 01/28/2018  . Pseudomonas urinary tract infection   . Pure hypercholesterolemia   . Elevated troponin   . Hydrocele, right   . Olecranon bursitis of left elbow   . Chronic renal insufficiency, stage III (moderate) (HCC)   . Coronary artery disease due to calcified coronary lesion   . Suprapubic catheter ([place])   . History of urethral stricture   .  Sepsis due to Pseudomonas aeruginosa ([place])   . Acute pyelonephritis   . Sepsis ([place]) 12/18/2017  . Hemispheric carotid artery syndrome 12/22/2016  . AKI (acute kidney injury) ([place]) 03/28/2016  . S/P angioplasty with stent 03/26/16 to VG to PDA for in-stent restenosis with DES. 03/28/2016  . History of ST elevation myocardial infarction (STEMI) 03/28/2016  . Carpal tunnel syndrome of right wrist 06/24/2015  . History of stroke 03/21/2015  . Diabetes mellitus treated with oral medication ([place]) 03/21/2015  . Stenosis of left carotid artery   . Essential hypertension   . Hyperlipidemia LDL goal <70   . Cerebral infarction due to stenosis of left  middle cerebral artery ([place]) 01/22/2015  . CAD S/P percutaneous coronary angioplasty 09/19/2014  . Coronary artery disease involving coronary bypass graft of native heart with other forms of angina pectoris ([place]) 09/19/2014  . Shortness of breath 09/19/2014  . Polypharmacy 09/19/2014  . Abdominal aortic aneurysm ([place])   . Stroke ([place])   . Hyperlipidemia associated with type 2 diabetes mellitus ([place])   . Type II diabetes mellitus with complication ([place])   . Coronary artery disease involving native coronary artery of native heart with angina pectoris ([place])   . Abnormal TSH 06/19/2014  . Obesity 06/19/2014  . STEMI (ST elevation myocardial infarction) ([place]) 06/18/2014  . Bilateral lower extremity edema 03/30/2014  . Dizziness 02/21/2014  . Bradycardia 08/31/2013  . Postoperative atrial fibrillation (HCC)     Class: Temporary  . Diabetes mellitus type 2 in obese ([place])   . Moderate essential hypertension   . Apnea, sleep   . Balanitis xerotica obliterans   . History of: Non-STEMI (non-ST elevated myocardial infarction) 11/16/2010    Class: History of  . S/P CABG x 2 11/16/2010    Past Surgical History:  Procedure Laterality Date  . APPENDECTOMY    . CARDIAC CATHETERIZATION  12/11/2010   Dr. Chase Picket - subsequent cath - normal LV systolic function, no renal artery stenosis, severe 2-vessel disease with subtotaled RCA prox and distal 60% lesion and complex 70% area of narrowing of ostium of LAD  . CARDIAC CATHETERIZATION N/A 03/26/2016   Procedure: Left Heart Cath and Coronary Angiography;  Surgeon: Nelva Bush, MD;  Location: [facility] CV LAB;  Service: Cardiovascular;  Laterality: N/A;  . CARDIAC CATHETERIZATION N/A 03/26/2016   Procedure: Coronary Stent Intervention;  Surgeon: Nelva Bush, MD;  Location: [facility] CV LAB;  Service: Cardiovascular: 100% In-stent thrombosis of pros SVG-RCA (Xience DES) --> treated with PromusDES 3.0 x 18 (3.3 mm)  . CARDIAC CATHETERIZATION  N/A 03/26/2016   Procedure: Bypass Graft Angiography;  Surgeon: Nelva Bush, MD;  Location: [facility];  Service: Cardiovascular;  Laterality: N/A;  . CORONARY ANGIOPLASTY WITH STENT PLACEMENT  06/18/2014   PCI to SVG-RPDA 06/18/14 (Xience Alpine DES 3.0 x 18 mm -3.35 mm),   . CORONARY ARTERY BYPASS GRAFT  12/15/2010   X2, Dr Servando Snare; LIMA to LAD, SVG to PDA;   . CORONARY/GRAFT ANGIOGRAPHY N/A 08/27/2017   Procedure: CORONARY/GRAFT ANGIOGRAPHY;  Surgeon: Nelva Bush, MD;  Location: [facility] CV LAB;  Service: Cardiovascular;; pLAD 70%, ostD1 50%.  mCx 60%, OM2 80%. pRCA 95% & mRCA 100% - rPDA 70%. LIMA-mLAD patent. SVG-rPDA 10% ISR.   Marland Kitchen CYSTOSCOPY WITH URETHRAL DILATATION    . IR ANGIO INTRA EXTRACRAN SEL COM CAROTID INNOMINATE BILAT MOD SED  02/25/2017  . IR ANGIO INTRA EXTRACRAN SEL COM CAROTID INNOMINATE BILAT MOD SED  10/19/2017   Dr.  Deveschwar: L Common Carotid - ECA & major branches widely patent. ICA ~20% distal to bulb & 50% in supraclinoid segment. LMCA-distal 1/3 MI ~905 stenosis with post-stenotic dilation into inferior division. ~50% prox Basilar A stenosis @ anterior Inf Cerebellar A. 50% R ICA  . IR ANGIO VERTEBRAL SEL SUBCLAVIAN INNOMINATE UNI L MOD SED  02/25/2017  . IR ANGIO VERTEBRAL SEL SUBCLAVIAN INNOMINATE UNI R MOD SED  10/19/2017  . IR ANGIO VERTEBRAL SEL VERTEBRAL UNI L MOD SED  10/19/2017  . IR GENERIC HISTORICAL  01/21/2016   IR RADIOLOGIST EVAL & MGMT 01/21/2016 [facility]-INTERV RAD  . IR GENERIC HISTORICAL  02/03/2016   IR CATHETER TUBE CHANGE 02/03/2016 Marybelle Killings, MD [facility]-INTERV RAD  . IR RADIOLOGIST EVAL & MGMT  11/09/2017  . LEFT HEART CATHETERIZATION WITH CORONARY ANGIOGRAM N/A 06/18/2014   Procedure: LEFT HEART CATHETERIZATION WITH CORONARY ANGIOGRAM;  Surgeon: Leonie Man, MD;  Location: [facility] CATH LAB;  Service: Cardiovascular;  -- severe disease of SVG-rPDA  . RADIOLOGY WITH ANESTHESIA N/A 01/24/2015   Procedure: STENT ASSISTED ANGIOPLASTY (RADIOLOGY WITH  ANESTHESIA);  Surgeon: Luanne Bras, MD;  Location: [facility];  Service: Radiology;  Laterality: N/A;  . TONSILLECTOMY    . TRANSTHORACIC ECHOCARDIOGRAM  3/'14; 11/'16:11/'17   a. EF 55-60%, severe Conc LVH, Nl Systolic fxn, G1 DD,  Ao Sclerosis w/o stenosis; CRO PFO;; b. Moderate concentric LVH. EF 60-65%. No RWMA, Gr 1 DD. Mild LA dilation;; c) HK of the distal inferior wall. EF 60-65%.  GR 1 DD. Ascending aorta/root 45-46 mm.  . TRANSTHORACIC ECHOCARDIOGRAM  08/2017    EF 60-65%. Mild LVH. No RWMA. Gr 1 DD. Mod LA dilation.         Home Medications    Prior to Admission medications   Medication Sig Start Date End Date Taking? Authorizing Provider  amLODipine (NORVASC) 10 MG tablet Take 10 mg by mouth every evening.     [provider]  aspirin 81 MG chewable tablet Chew 1 tablet (81 mg total) by mouth daily. Patient taking differently: Chew 81 mg by mouth every morning.  03/29/16   Isaiah Serge, NP  carvedilol (COREG) 12.5 MG tablet Take 1/2 tablet ( 6.25 mg ) twice a day 02/15/18   Barrett, Evelene Croon, PA-C  clopidogrel (PLAVIX) 75 MG tablet Take 1 tablet (75 mg total) by mouth daily. Patient taking differently: Take 75 mg by mouth every morning.  09/22/17   Croitoru, Mihai, MD  furosemide (LASIX) 20 MG tablet Take 20 mg by mouth as directed.    [provider]  glimepiride (AMARYL) 4 MG tablet Take 4 mg by mouth 2 (two) times daily with a meal.     [provider]  hydrALAZINE (APRESOLINE) 100 MG tablet Take 1 tablet (100 mg total) by mouth 3 (three) times daily. 12/16/17   Leonie Man, MD  isosorbide mononitrate (IMDUR) 60 MG 24 hr tablet Take 1 tablet (60 mg total) by mouth 2 (two) times daily. 01/28/18   Geradine Girt, DO  losartan-hydrochlorothiazide (HYZAAR) 100-25 MG tablet Take 1 tablet by mouth every evening. 12/16/17   Leonie Man, MD  metFORMIN (GLUCOPHAGE) 1000 MG tablet Take 1 tablet (1,000 mg total) by mouth 2 (two) times daily. 03/29/16    Isaiah Serge, NP  nitroGLYCERIN (NITROSTAT) 0.4 MG SL tablet Place 1 tablet (0.4 mg total) under the tongue every 5 (five) minutes as needed for chest pain (up to 3 doses). 08/25/17   Almyra Deforest, Edie  oxybutynin (DITROPAN) 5 MG tablet Take 5 mg by mouth daily as needed for bladder spasms.  09/07/17   [provider]    Family History Family History  Problem Relation Age of Onset  . Heart Problems Father 48    Social History Social History   Tobacco Use  . Smoking status: Former Smoker    Packs/day: 0.33    Years: 10.00    Pack years: 3.30    Types: Cigarettes    Last attempt to quit: 1961    Years since quitting: 58.9  . Smokeless tobacco: Never Used  Substance Use Topics  . Alcohol use: No  . Drug use: No     Allergies   Other; Statins; Atorvastatin; Crestor [rosuvastatin]; Pravastatin; and Simvastatin   Review of Systems Review of Systems  Gastrointestinal: Positive for abdominal pain.  All other systems reviewed and are negative.    Physical Exam Updated Vital Signs BP (!) 162/68   Pulse (!) 58   Temp 97.9 F (36.6 C) (Oral)   Resp (!) 22   Ht 6\' 1"  (1.854 m)   Wt 97.5 kg   SpO2 95%   BMI 28.36 kg/m   Physical Exam  Constitutional: He is oriented to person, place, and time. He appears well-developed and well-nourished.  Elderly male who appears very uncomfortable due to pain  HENT:  Head: Normocephalic and atraumatic.  Eyes: Pupils are equal, round, and reactive to light. Conjunctivae and EOM are normal.  Neck: Normal range of motion. Neck supple.  Cardiovascular: Normal rate, regular rhythm and intact distal pulses.  Pulmonary/Chest: Effort normal and breath sounds normal. No respiratory distress. He has no wheezes.  Abdominal: Soft. He exhibits no distension and no mass. There is tenderness. There is no rebound and no guarding.  Tenderness to palpation the abdomen, mostly suprapubic.  No rigidity or distention.  No obvious masses.  Supra  pubic catheter with urine leaking around the sides.  No surrounding signs of cellulitis.  Musculoskeletal: Normal range of motion.  Neurological: He is alert and oriented to person, place, and time.  Skin: Skin is warm and dry. Capillary refill takes less than 2 seconds.  Psychiatric: He has a normal mood and affect.  Nursing note and vitals reviewed.    ED Treatments / Results  Labs (all labs ordered are listed, but only abnormal results are displayed) Labs Reviewed - No data to display  EKG None  Radiology No results found.  Procedures INSERT SUPRAPUBIC CATHETER Date/Time: 04/16/2018 3:39 PM Performed by: Franchot Heidelberg, PA-C Authorized by: Franchot Heidelberg, PA-C  Consent: Verbal consent obtained. Risks and benefits: risks, benefits and alternatives were discussed Consent given by: patient Patient identity confirmed: verbally with patient and arm band Preparation: Patient was prepped and draped in the usual sterile fashion. Local anesthesia used: no  Anesthesia: Local anesthesia used: no  Sedation: Patient sedated: no  Patient tolerance: Patient tolerated the procedure well with no immediate complications Comments: 20 F suprapubic foley catheter placed without complication. Clear, lightly blood-tinged urine returned.     (including critical care time)   Medications Ordered in ED Medications - No data to display   Initial Impression / Assessment and Plan / ED Course  I have reviewed the triage vital signs and the nursing notes.  Pertinent labs & imaging results that were available during my care of the patient were reviewed by me and considered in my medical decision making (see chart for details).     Patient presenting for  evaluation of lower abdominal pain and problems with a suprapubic Foley catheter.  Physical exam shows patient who appears very uncomfortable, but otherwise nontoxic.  No recent signs of infection including fevers, chills, nausea,  vomiting, or change in appearance of the urine.  Foley catheter is likely blocked.  Discussed with attending, Dr. Eulis Foster evaluated the patient. Suprapubic catheter changed, 80 F was the only available catheter at that time.  Will order patient's home size, 20 F, from central supply.  Upon changing the catheter, immediate drainage of urine, and patient's pain improved immediately.  Blood pressure and patient's demeanor improved.  62 F catheter replaced with 32 F, as described above. Patient tolerated procedure well.  Minimal blood-tinged of the urine, likely due to repeat catheter changing. No gross hematuria.  No further complaints at this time.  Discussed patient to follow-up with urology as needed, or for monthly catheter exchange.  At this time, patient appears safe for discharge.  Return precautions given.  Patient states he understands and agrees to plan.   Final Clinical Impressions(s) / ED Diagnoses   Final diagnoses:  Obstruction of suprapubic catheter, initial encounter [facility])    ED Discharge Orders    None       Franchot Heidelberg, PA-C 04/16/18 2007    Daleen Bo, MD 04/17/18 1649

## 2018-04-16 NOTE — H&P (Signed)
History and Physical    [name] ZWC:[id number] DOB: [birth year] DOA: 04/16/2018  PCP: Suzan Garibaldi, FNP Patient coming from: Home  Chief Complaint: Chest pain  HPI: [name] is a 84 y.o. male with medical history significant of CAD status post CABG and PCI, chronic diastolic congestive heart failure, frequent UTIs, urethral stricture and subsequent suprapubic catheter placement, type 2 diabetes, hypertension, CVA, AAA, chronic lower extremity edema, OSA, hyperlipidemia, CKD 3 presenting to the hospital for evaluation of chest pain.  Patient reports having chest pain since 4 PM on 11/30.  The chest pain is left-sided, at rest, constant in nature, and 4-5 out of 10 in intensity.  It is not associated with nausea, shortness of breath, or diaphoresis.  States nitroglycerin he received in the ED alleviated the pain.  He is currently chest pain-free.  States his suprapubic catheter has been draining well since he left the ED yesterday..  Denies having any abdominal pain, fevers, or chills.  Patient was seen in the ED on 11/30 for evaluation of lower abdominal pain and problems with a suprapubic Foley catheter.  His suprapubic catheter was changed and there was drainage of urine.  Patient's pain had improved and he was discharged home.  ED Course: Afebrile, pulse in the 50s to 60s (on home beta blocker), tachypneic, blood pressure 169/69 on arrival, satting well on room air.  White count 12.2.  Creatinine 1.5, recent baseline 1.2-1.3.  I-STAT troponin I 0.10.  EKG without acute abnormality.  Chest x-ray showing stable cardiomegaly with mild interstitial congestion without frank pulmonary edema; superimposed mild scattered bibasilar atelectasis.  Review of Systems: As per HPI otherwise 10 point review of systems negative.  Past Medical History:  Diagnosis Date  . Abdominal aortic aneurysm ([place])    a. Aortic duplex 06/2014: mild aneurysmal dilatation of proximal abdominal aorta measuring  3.4x3.4cm. No sig change from 2012. F/u due 06/2016;   . AKI (acute kidney injury) ([place]) 03/28/2016  . Arthritis    "right knee; never bothered me" (03/26/2016)  . 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 CAD in native artery    a. NSTEMI 11/2010 - CABG x2(LIMA to LAD, SVG to PDA). b. NEG Lexi MV 10/24/13, EF 53%, no perfusion abnormality, septal and apical HK noted. c. NSTEMI 05/2014 - s/p DES to SVG-RPDA 06/18/14 (Xience Alpine DES 3.0 x 18 mm -3.35 mm), EF 60-65; d. 03/2016 STEMI/PCI: LM nl, ost LAD 70%, mLCx 50%, pRCA 95% - mRCA 60%, dRPDA 70%, LIMA->LAD ok, o-p SVG->RPDA 100% (3.0x16 Promus DES overlapping prior stent).  . Diastolic dysfunction    a. 03/2016 Echo: EF 60-65%, no rwma, Gr1 DD, triv AI, Ao root 55mm, Asc Ao 26mm, triv MR.  . Dizziness    a. Carotid duplex 03/2014: mild fibrous plaque, no significant stenosis.  . Foley catheter in place    "been wearing it for a couple months now" (03/26/2016)  . HTN (hypertension)   . Hyperlipidemia LDL goal <70   . Non-STEMI (non-ST elevated myocardial infarction) ([place]) 11/2010   1. Ostial LAD 70% (to close to [facility] for PCI), subtotal occlusion of the RCA  . Postoperative atrial fibrillation ([place]) 11/2010   Post CABG, no sign recurrence  . Refusal of blood transfusions as patient is Jehovah's Witness   . 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 Servando Snare)  . Sleep apnea    Not on CPAP. (03/26/2016)  . ST  elevation myocardial infarction (STEMI) of inferior wall ([facility]) 04/05/2016   Occluded in-stent restenosis/thrombosis of SVG-RCA --> treated with overlapping Promus DES 3.0 mm 16 mm postdilated 3.0 mm).  . Stroke [facility]) 2014; 01/2015   a. 2014 with mild right hand weakness, nonhemorrhagic per pt.;; b. - PTA-Stent L ICA 95%   . Type II diabetes mellitus ([place])     Past Surgical History:  Procedure Laterality Date  . APPENDECTOMY    . CARDIAC CATHETERIZATION  12/11/2010   Dr. Chase Picket - subsequent  cath - normal LV systolic function, no renal artery stenosis, severe 2-vessel disease with subtotaled RCA prox and distal 60% lesion and complex 70% area of narrowing of ostium of LAD  . CARDIAC CATHETERIZATION N/A 03/26/2016   Procedure: Left Heart Cath and Coronary Angiography;  Surgeon: Nelva Bush, MD;  Location: [place] [facility];  Service: Cardiovascular;  Laterality: N/A;  . CARDIAC CATHETERIZATION N/A 03/26/2016   Procedure: Coronary Stent Intervention;  Surgeon: Nelva Bush, MD;  Location: [place] CV LAB;  Service: Cardiovascular: 100% In-stent thrombosis of pros SVG-RCA (Xience DES) --> treated with PromusDES 3.0 x 18 (3.3 mm)  . CARDIAC CATHETERIZATION N/A 03/26/2016   Procedure: Bypass Graft Angiography;  Surgeon: Nelva Bush, MD;  Location: [place] [facility];  Service: Cardiovascular;  Laterality: N/A;  . CORONARY ANGIOPLASTY WITH STENT PLACEMENT  06/18/2014   PCI to SVG-RPDA 06/18/14 (Xience Alpine DES 3.0 x 18 mm -3.35 mm),   . CORONARY ARTERY BYPASS GRAFT  12/15/2010   X2, Dr Servando Snare; LIMA to LAD, SVG to PDA;   . CORONARY/GRAFT ANGIOGRAPHY N/A 08/27/2017   Procedure: CORONARY/GRAFT ANGIOGRAPHY;  Surgeon: Nelva Bush, MD;  Location: [place] CV LAB;  Service: Cardiovascular;; pLAD 70%, ostD1 50%.  mCx 60%, OM2 80%. pRCA 95% & mRCA 100% - rPDA 70%. LIMA-mLAD patent. SVG-rPDA 10% ISR.   Marland Kitchen CYSTOSCOPY WITH URETHRAL DILATATION    . IR ANGIO INTRA EXTRACRAN SEL COM CAROTID INNOMINATE BILAT MOD SED  02/25/2017  . IR ANGIO INTRA EXTRACRAN SEL COM CAROTID INNOMINATE BILAT MOD SED  10/19/2017   Dr. Kathee Delton: L Common Carotid - ECA & major branches widely patent. ICA ~20% distal to bulb & 50% in supraclinoid segment. LMCA-distal 1/3 MI ~905 stenosis with post-stenotic dilation into inferior division. ~50% prox Basilar A stenosis @ anterior Inf Cerebellar A. 50% R ICA  . IR ANGIO VERTEBRAL SEL SUBCLAVIAN INNOMINATE UNI L MOD SED  02/25/2017  . IR ANGIO VERTEBRAL SEL SUBCLAVIAN  INNOMINATE UNI R MOD SED  10/19/2017  . IR ANGIO VERTEBRAL SEL VERTEBRAL UNI L MOD SED  10/19/2017  . IR GENERIC HISTORICAL  01/21/2016   IR RADIOLOGIST EVAL & MGMT 01/21/2016 [facility]-INTERV RAD  . IR GENERIC HISTORICAL  02/03/2016   IR CATHETER TUBE CHANGE 02/03/2016 Marybelle Killings, MD [facility]-INTERV RAD  . IR RADIOLOGIST EVAL & MGMT  11/09/2017  . LEFT HEART CATHETERIZATION WITH CORONARY ANGIOGRAM N/A 06/18/2014   Procedure: LEFT HEART CATHETERIZATION WITH CORONARY ANGIOGRAM;  Surgeon: Leonie Man, MD;  Location: [facility] CATH LAB;  Service: Cardiovascular;  -- severe disease of SVG-rPDA  . RADIOLOGY WITH ANESTHESIA N/A 01/24/2015   Procedure: STENT ASSISTED ANGIOPLASTY (RADIOLOGY WITH ANESTHESIA);  Surgeon: Luanne Bras, MD;  Location: [place];  Service: Radiology;  Laterality: N/A;  . TONSILLECTOMY    . TRANSTHORACIC ECHOCARDIOGRAM  3/'14; 11/'16:11/'17   a. EF 55-60%, severe Conc LVH, Nl Systolic fxn, G1 DD,  Ao Sclerosis w/o stenosis; CRO PFO;; b. Moderate concentric LVH. EF 60-65%.  No RWMA, Gr 1 DD. Mild LA dilation;; c) HK of the distal inferior wall. EF 60-65%.  GR 1 DD. Ascending aorta/root 45-46 mm.  . TRANSTHORACIC ECHOCARDIOGRAM  08/2017    EF 60-65%. Mild LVH. No RWMA. Gr 1 DD. Mod LA dilation.      reports that he quit smoking about 58 years ago. His smoking use included cigarettes. He has a 3.30 pack-year smoking history. He has never used smokeless tobacco. He reports that he does not drink alcohol or use drugs.  Allergies  Allergen Reactions  . Other Other (See Comments)    No  BLOOD PRODUCTS - Pt is Jehovah's Witness  . Statins Other (See Comments)    Cause muscle aches  . Atorvastatin Other (See Comments)    Myalgias  . Crestor [Rosuvastatin] Other (See Comments)    Myalgias   . Pravastatin Other (See Comments)    Myalgias  . Simvastatin Other (See Comments)    Myalgias     Family History  Problem Relation Age of Onset  . Heart Problems Father 16    Prior to Admission medications     Medication Sig Start Date End Date Taking? Authorizing Provider  amLODipine (NORVASC) 10 MG tablet Take 10 mg by mouth every evening.    Yes [provider]  aspirin 81 MG chewable tablet Chew 1 tablet (81 mg total) by mouth daily. 03/29/16  Yes Isaiah Serge, NP  carvedilol (COREG) 12.5 MG tablet Take 1/2 tablet ( 6.25 mg ) twice a day Patient taking differently: Take 6.25 mg by mouth 2 (two) times daily with a meal. day 02/15/18  Yes Barrett, Evelene Croon, PA-C  clopidogrel (PLAVIX) 75 MG tablet Take 1 tablet (75 mg total) by mouth daily. 09/22/17  Yes Croitoru, Mihai, MD  furosemide (LASIX) 20 MG tablet Take 20 mg by mouth every evening.    Yes [provider]  glimepiride (AMARYL) 4 MG tablet Take 4 mg by mouth 2 (two) times daily with a meal.    Yes [provider]  hydrALAZINE (APRESOLINE) 100 MG tablet Take 1 tablet (100 mg total) by mouth 3 (three) times daily. 12/16/17  Yes Leonie Man, MD  isosorbide mononitrate (IMDUR) 60 MG 24 hr tablet Take 1 tablet (60 mg total) by mouth 2 (two) times daily. 01/28/18  Yes Eulogio Bear U, DO  metFORMIN (GLUCOPHAGE) 1000 MG tablet Take 1 tablet (1,000 mg total) by mouth 2 (two) times daily. 03/29/16  Yes Isaiah Serge, NP  nitroGLYCERIN (NITROSTAT) 0.4 MG SL tablet Place 1 tablet (0.4 mg total) under the tongue every 5 (five) minutes as needed for chest pain (up to 3 doses). 08/25/17  Yes Almyra Deforest, PA  oxybutynin (DITROPAN) 5 MG tablet Take 5 mg by mouth daily as needed for bladder spasms.  09/07/17  Yes [provider]  losartan-hydrochlorothiazide (HYZAAR) 100-25 MG tablet Take 1 tablet by mouth every evening. Patient not taking: Reported on 04/16/2018 12/16/17   Leonie Man, MD    Physical Exam: Vitals:   04/17/18 0000 04/17/18 0030 04/17/18 0102 04/17/18 0342  BP: (!) 153/69 (!) 146/84 (!) 164/79 (!) 129/52  Pulse: (!) 58 62 (!) 56   Resp: (!) 25 15    Temp:   98.5 F (36.9 C) 98.2 F (36.8 C)   TempSrc:   Axillary Oral  SpO2: 95% 97% 99%   Weight:   95.8 kg   Height:   6\' 1"  (1.854 m)     Physical  Exam  Constitutional: He is oriented to person, place, and time. He appears well-developed and well-nourished. No distress.  HENT:  Head: Normocephalic.  Mouth/Throat: Oropharynx is clear and moist.  Eyes: Right eye exhibits no discharge. Left eye exhibits no discharge.  Neck: Neck supple.  Cardiovascular: Normal rate, regular rhythm and intact distal pulses.  Pulmonary/Chest: Effort normal and breath sounds normal. No respiratory distress. He has no wheezes. He has no rales.  Abdominal: Soft. Bowel sounds are normal. He exhibits no distension. There is no tenderness.  Genitourinary:  Genitourinary Comments: Suprapubic catheter in place Chaperone at bedside  Musculoskeletal: He exhibits edema.  +1 to +2 pitting edema of bilateral lower extremities  Neurological: He is alert and oriented to person, place, and time.  Skin: Skin is warm and dry. He is not diaphoretic.  Psychiatric: He has a normal mood and affect. His behavior is normal.   Labs on Admission: I have personally reviewed following labs and imaging studies  CBC: Recent Labs  Lab 04/16/18 2137 04/17/18 0234  WBC 12.2* 10.8*  HGB 9.4* 9.1*  HCT 31.5* 28.3*  MCV 89.7 87.6  PLT 329 496   Basic Metabolic Panel: Recent Labs  Lab 04/16/18 2137 04/17/18 0234  NA 138 138  K 4.2 3.6  CL 103 104  CO2 25 23  GLUCOSE 176* 135*  BUN 30* 28*  CREATININE 1.51* 1.35*  CALCIUM 9.2 8.8*   GFR: Estimated Creatinine Clearance: 45.2 mL/min (A) (by C-G formula based on SCr of 1.35 mg/dL (H)). Liver Function Tests: No results for input(s): AST, ALT, ALKPHOS, BILITOT, PROT, ALBUMIN in the last 168 hours. No results for input(s): LIPASE, AMYLASE in the last 168 hours. No results for input(s): AMMONIA in the last 168 hours. Coagulation Profile: No results for input(s): INR, PROTIME in the last 168 hours. Cardiac  Enzymes: Recent Labs  Lab 04/16/18 2229 04/17/18 0234  TROPONINI 1.07* 1.65*   BNP (last 3 results) No results for input(s): PROBNP in the last 8760 hours. HbA1C: Recent Labs    04/17/18 0234  HGBA1C 8.2*   CBG: Recent Labs  Lab 04/17/18 0059  GLUCAP 135*   Lipid Profile: No results for input(s): CHOL, HDL, LDLCALC, TRIG, CHOLHDL, LDLDIRECT in the last 72 hours. Thyroid Function Tests: No results for input(s): TSH, T4TOTAL, FREET4, T3FREE, THYROIDAB in the last 72 hours. Anemia Panel: No results for input(s): VITAMINB12, FOLATE, FERRITIN, TIBC, IRON, RETICCTPCT in the last 72 hours. Urine analysis:    Component Value Date/Time   COLORURINE YELLOW 12/18/2017 1356   APPEARANCEUR CLEAR 12/18/2017 1356   LABSPEC 1.014 12/18/2017 1356   PHURINE 5.0 12/18/2017 1356   GLUCOSEU 50 (A) 12/18/2017 1356   HGBUR SMALL (A) 12/18/2017 1356   BILIRUBINUR NEGATIVE 12/18/2017 1356   KETONESUR NEGATIVE 12/18/2017 1356   PROTEINUR >=300 (A) 12/18/2017 1356   UROBILINOGEN 0.2 01/22/2015 1031   NITRITE POSITIVE (A) 12/18/2017 1356   LEUKOCYTESUR SMALL (A) 12/18/2017 1356    Radiological Exams on Admission: Dg Chest 2 View  Result Date: 04/16/2018 CLINICAL DATA:  Initial evaluation for acute left-sided chest pain. EXAM: CHEST - 2 VIEW COMPARISON:  Prior radiograph from 01/28/2018. FINDINGS: Median sternotomy wires with underlying CABG markers noted. Stable cardiomegaly. Mediastinal silhouette unchanged, and remains within normal limits. Aortic atherosclerosis. Lungs mildly hypoinflated. Mild scattered bibasilar linear atelectatic changes with mild interstitial congestion. No frank pulmonary edema. No pleural effusion. No consolidative airspace disease. No pneumothorax. No acute osseus abnormality. Prominent degenerative changes noted about the partially visualized  shoulders. IMPRESSION: 1. Stable cardiomegaly with mild interstitial congestion without frank pulmonary edema. 2. Superimposed  mild scattered bibasilar atelectasis. No other active cardiopulmonary disease. 3. Sequelae of prior CABG with aortic atherosclerosis. Electronically Signed   By: Jeannine Boga M.D.   On: 04/16/2018 22:18    EKG: Independently reviewed.  Sinus rhythm, PR prolongation, no acute ST segment/T wave changes.  Assessment/Plan Principal Problem:   NSTEMI (non-ST elevated myocardial infarction) ([place]) Active Problems:   Diabetes mellitus type 2 in obese ([place])   Stroke [facility])   Essential hypertension   CKD (chronic kidney disease) stage 3, GFR 30-59 ml/min ([facility])   Leukocytosis   Chronic diastolic CHF (congestive heart failure) ([facility])   History of suprapubic catheter   Chronic anemia   NSTEMI -History of CAD status post CABG and PCI -Patient chest pain-free after receiving sublingual nitroglycerin in the ED.  Troponin 1.1 > 1.0 > 1.6. EKG on admission without acute abnormality.   -Patient was seen by cardiology and his NSTEMI is thought to be likely due to demand ischemia in the setting of abdominal pain, urinary catheter obstruction, and hypertensive urgency earlier yesterday.   -IV heparin -Repeat echo -Aspirin 81 mg daily -Cycle troponin -Serial EKGs -Carvedilol 12.5 mg twice daily -Plavix 75 mg daily -Cardiology recommending avoiding statins as patient has an allergy to this drug class.    HTN Blood pressure elevated on admission, currently normotensive. -Continue hydralazine, isosorbide mononitrate, and amlodipine   -Hold home losartan-hydrochlorothiazide as creatinine slightly elevated on admission  Mild leukocytosis White count 12.2 on admission.  Patient is afebrile and nontoxic-appearing.  Chest x-ray not suggestive of pneumonia. -Check UA -Continue to monitor; repeat CBC in am  Chronic diastolic congestive heart failure Chest x-ray showing stable cardiomegaly with mild interstitial congestion without frank pulmonary edema.  He has bilateral lower extremity edema, however,  not hypoxic and breathing comfortably on room air. -Hold home Lasix, lisinopril in the setting of slightly elevated creatinine on admission -Continue home beta-blocker  Hx of urethral stricture s/p suprapubic catheter placement Patient was seen in the ED on 11/30 for evaluation of lower abdominal pain and problems with a suprapubic Foley catheter.  His suprapubic catheter was changed and there was drainage of urine.  Patient's pain had improved and he was discharged home.  He is not complaining of any pain at present and catheter is draining well.  Urine appears slightly blood-tinged but is not grossly bloody.  -Check UA -Continue heparin drip for NSTEMI -Continue home Ditropan  Type 2 diabetes A1c 8.2. -Sliding scale insulin sensitive -CBG checks  History of CVA  -Continue aspirin, Plavix  CKD 3 Creatinine 1.5 on admission, recent baseline 1.2-1.3.   -Monitor renal function -Avoid nephrotoxic agents/contrast  Chronic anemia -Stable. Hemoglobin 9.4 on admission, recent baseline 8.4-9.9.  DVT prophylaxis: Heparin Code Status: Patient wishes to be full code. Family Communication: No family available Disposition Plan: Anticipate discharge to home in 1 to 2 days. Consults called: Cardiology (Dr. Paticia Stack) Admission status: Observation   Shela Leff MD [facility] Pager [phone]  If 7PM-7AM, please contact night-coverage [email or web address] Password [email or web address]  04/17/2018, 7:07 AM

## 2018-04-16 NOTE — ED Triage Notes (Signed)
Patient was seen earlier today for a suprapubic catheter problem. States when he was here earlier, he began to have CP prior to d/c but only told his wife. Took 325 asa prior to arrival. Mild SOB.

## 2018-04-16 NOTE — ED Provider Notes (Signed)
[facility] EMERGENCY DEPARTMENT Provider Note   CSN: [id number] Arrival date & time: 04/16/18  2125     History   Chief Complaint Chief Complaint  Patient presents with  . Chest Pain    HPI [name] is a 84 y.o. male.  HPI  83 year old male presents with 5 out of 10 left-sided chest pain.  He states it feels uncomfortable and may be like a pressure but is hard to describe.  Not sure if this feels like prior heart attack.  There is some shortness of breath but no diaphoresis, radiation of the pain, back pain, nausea or vomiting.  Chronic lower extremity swelling that is unchanged.  He was seen here earlier in the day for an obstructed suprapubic Foley catheter.  On the tail end of that visit he did notice a little bit of discomfort in his chest but it was not bad enough to mention anything.  When he got worse at home he decided to come into the hospital.  He chewed to 300 mg aspirin prior to arrival.  Past Medical History:  Diagnosis Date  . Abdominal aortic aneurysm ([place])    a. Aortic duplex 06/2014: mild aneurysmal dilatation of proximal abdominal aorta measuring 3.4x3.4cm. No sig change from 2012. F/u due 06/2016;   . AKI (acute kidney injury) ([place]) 03/28/2016  . Arthritis    "right knee; never bothered me" (03/26/2016)  . 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 CAD in native artery    a. NSTEMI 11/2010 - CABG x2(LIMA to LAD, SVG to PDA). b. NEG Lexi MV 10/24/13, EF 53%, no perfusion abnormality, septal and apical HK noted. c. NSTEMI 05/2014 - s/p DES to SVG-RPDA 06/18/14 (Xience Alpine DES 3.0 x 18 mm -3.35 mm), EF 60-65; d. 03/2016 STEMI/PCI: LM nl, ost LAD 70%, mLCx 50%, pRCA 95% - mRCA 60%, dRPDA 70%, LIMA->LAD ok, o-p SVG->RPDA 100% (3.0x16 Promus DES overlapping prior stent).  . Diastolic dysfunction    a. 03/2016 Echo: EF 60-65%, no rwma, Gr1 DD, triv AI, Ao root 35mm, Asc Ao 64mm, triv MR.  . Dizziness    a. Carotid  duplex 03/2014: mild fibrous plaque, no significant stenosis.  . Foley catheter in place    "been wearing it for a couple months now" (03/26/2016)  . HTN (hypertension)   . Hyperlipidemia LDL goal <70   . Non-STEMI (non-ST elevated myocardial infarction) ([place]) 11/2010   1. Ostial LAD 70% (to close to [facility] for PCI), subtotal occlusion of the RCA  . Postoperative atrial fibrillation ([place]) 11/2010   Post CABG, no sign recurrence  . Refusal of blood transfusions as patient is Jehovah's Witness   . 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 Servando Snare)  . Sleep apnea    Not on CPAP. (03/26/2016)  . ST elevation myocardial infarction (STEMI) of inferior wall ([place]) 04/05/2016   Occluded in-stent restenosis/thrombosis of SVG-RCA --> treated with overlapping Promus DES 3.0 mm 16 mm postdilated 3.0 mm).  . Stroke [facility]) 2014; 01/2015   a. 2014 with mild right hand weakness, nonhemorrhagic per pt.;; b. - PTA-Stent L ICA 95%   . Type II diabetes mellitus [facility])     Patient Active Problem List   Diagnosis Date Noted  . Chest pain 01/28/2018  . Pseudomonas urinary tract infection   . Pure hypercholesterolemia   . Elevated troponin   . Hydrocele, right   . Olecranon bursitis  of left elbow   . Chronic renal insufficiency, stage III (moderate) (HCC)   . Coronary artery disease due to calcified coronary lesion   . Suprapubic catheter ([place])   . History of urethral stricture   . Sepsis due to Pseudomonas aeruginosa ([place])   . Acute pyelonephritis   . Sepsis ([place]) 12/18/2017  . Hemispheric carotid artery syndrome 12/22/2016  . AKI (acute kidney injury) ([place]) 03/28/2016  . S/P angioplasty with stent 03/26/16 to VG to PDA for in-stent restenosis with DES. 03/28/2016  . History of ST elevation myocardial infarction (STEMI) 03/28/2016  . Carpal tunnel syndrome of right wrist 06/24/2015  . History of stroke 03/21/2015  . Diabetes mellitus treated with oral medication ([place]) 03/21/2015  . Stenosis of  left carotid artery   . Essential hypertension   . Hyperlipidemia LDL goal <70   . Cerebral infarction due to stenosis of left middle cerebral artery ([place]) 01/22/2015  . CAD S/P percutaneous coronary angioplasty 09/19/2014  . Coronary artery disease involving coronary bypass graft of native heart with other forms of angina pectoris ([place]) 09/19/2014  . Shortness of breath 09/19/2014  . Polypharmacy 09/19/2014  . Abdominal aortic aneurysm ([place])   . Stroke ([employer])   . Hyperlipidemia associated with type 2 diabetes mellitus ([place])   . Type II diabetes mellitus with complication ([place])   . Coronary artery disease involving native coronary artery of native heart with angina pectoris ([place])   . Abnormal TSH 06/19/2014  . Obesity 06/19/2014  . STEMI (ST elevation myocardial infarction) ([place]) 06/18/2014  . Bilateral lower extremity edema 03/30/2014  . Dizziness 02/21/2014  . Bradycardia 08/31/2013  . Postoperative atrial fibrillation (HCC)     Class: Temporary  . Diabetes mellitus type 2 in obese ([place])   . Moderate essential hypertension   . Apnea, sleep   . Balanitis xerotica obliterans   . History of: Non-STEMI (non-ST elevated myocardial infarction) 11/16/2010    Class: History of  . S/P CABG x 2 11/16/2010    Past Surgical History:  Procedure Laterality Date  . APPENDECTOMY    . CARDIAC CATHETERIZATION  12/11/2010   Dr. Chase Picket - subsequent cath - normal LV systolic function, no renal artery stenosis, severe 2-vessel disease with subtotaled RCA prox and distal 60% lesion and complex 70% area of narrowing of ostium of LAD  . CARDIAC CATHETERIZATION N/A 03/26/2016   Procedure: Left Heart Cath and Coronary Angiography;  Surgeon: Nelva Bush, MD;  Location: [place] CV LAB;  Service: Cardiovascular;  Laterality: N/A;  . CARDIAC CATHETERIZATION N/A 03/26/2016   Procedure: Coronary Stent Intervention;  Surgeon: Nelva Bush, MD;  Location: [place] CV LAB;  Service:  Cardiovascular: 100% In-stent thrombosis of pros SVG-RCA (Xience DES) --> treated with PromusDES 3.0 x 18 (3.3 mm)  . CARDIAC CATHETERIZATION N/A 03/26/2016   Procedure: Bypass Graft Angiography;  Surgeon: Nelva Bush, MD;  Location: [place] CV LAB;  Service: Cardiovascular;  Laterality: N/A;  . CORONARY ANGIOPLASTY WITH STENT PLACEMENT  06/18/2014   PCI to SVG-RPDA 06/18/14 (Xience Alpine DES 3.0 x 18 mm -3.35 mm),   . CORONARY ARTERY BYPASS GRAFT  12/15/2010   X2, Dr Servando Snare; LIMA to LAD, SVG to PDA;   . CORONARY/GRAFT ANGIOGRAPHY N/A 08/27/2017   Procedure: CORONARY/GRAFT ANGIOGRAPHY;  Surgeon: Nelva Bush, MD;  Location: [place] CV LAB;  Service: Cardiovascular;; pLAD 70%, ostD1 50%.  mCx 60%, OM2 80%. pRCA 95% & mRCA 100% - rPDA 70%. LIMA-mLAD patent. SVG-rPDA 10% ISR.   Marland Kitchen  CYSTOSCOPY WITH URETHRAL DILATATION    . IR ANGIO INTRA EXTRACRAN SEL COM CAROTID INNOMINATE BILAT MOD SED  02/25/2017  . IR ANGIO INTRA EXTRACRAN SEL COM CAROTID INNOMINATE BILAT MOD SED  10/19/2017   Dr. Kathee Delton: L Common Carotid - ECA & major branches widely patent. ICA ~20% distal to bulb & 50% in supraclinoid segment. LMCA-distal 1/3 MI ~905 stenosis with post-stenotic dilation into inferior division. ~50% prox Basilar A stenosis @ anterior Inf Cerebellar A. 50% R ICA  . IR ANGIO VERTEBRAL SEL SUBCLAVIAN INNOMINATE UNI L MOD SED  02/25/2017  . IR ANGIO VERTEBRAL SEL SUBCLAVIAN INNOMINATE UNI R MOD SED  10/19/2017  . IR ANGIO VERTEBRAL SEL VERTEBRAL UNI L MOD SED  10/19/2017  . IR GENERIC HISTORICAL  01/21/2016   IR RADIOLOGIST EVAL & MGMT 01/21/2016 [facility]-INTERV RAD  . IR GENERIC HISTORICAL  02/03/2016   IR CATHETER TUBE CHANGE 02/03/2016 Marybelle Killings, MD [facility]-INTERV RAD  . IR RADIOLOGIST EVAL & MGMT  11/09/2017  . LEFT HEART CATHETERIZATION WITH CORONARY ANGIOGRAM N/A 06/18/2014   Procedure: LEFT HEART CATHETERIZATION WITH CORONARY ANGIOGRAM;  Surgeon: Leonie Man, MD;  Location: [facility] CATH LAB;  Service: Cardiovascular;   -- severe disease of SVG-rPDA  . RADIOLOGY WITH ANESTHESIA N/A 01/24/2015   Procedure: STENT ASSISTED ANGIOPLASTY (RADIOLOGY WITH ANESTHESIA);  Surgeon: Luanne Bras, MD;  Location: [place];  Service: Radiology;  Laterality: N/A;  . TONSILLECTOMY    . TRANSTHORACIC ECHOCARDIOGRAM  3/'14; 11/'16:11/'17   a. EF 55-60%, severe Conc LVH, Nl Systolic fxn, G1 DD,  Ao Sclerosis w/o stenosis; CRO PFO;; b. Moderate concentric LVH. EF 60-65%. No RWMA, Gr 1 DD. Mild LA dilation;; c) HK of the distal inferior wall. EF 60-65%.  GR 1 DD. Ascending aorta/root 45-46 mm.  . TRANSTHORACIC ECHOCARDIOGRAM  08/2017    EF 60-65%. Mild LVH. No RWMA. Gr 1 DD. Mod LA dilation.         Home Medications    Prior to Admission medications   Medication Sig Start Date End Date Taking? Authorizing Provider  amLODipine (NORVASC) 10 MG tablet Take 10 mg by mouth every evening.    Yes [provider]  aspirin 81 MG chewable tablet Chew 1 tablet (81 mg total) by mouth daily. 03/29/16  Yes Isaiah Serge, NP  carvedilol (COREG) 12.5 MG tablet Take 1/2 tablet ( 6.25 mg ) twice a day Patient taking differently: Take 6.25 mg by mouth 2 (two) times daily with a meal. day 02/15/18  Yes Barrett, Evelene Croon, PA-C  clopidogrel (PLAVIX) 75 MG tablet Take 1 tablet (75 mg total) by mouth daily. 09/22/17  Yes Croitoru, Mihai, MD  furosemide (LASIX) 20 MG tablet Take 20 mg by mouth every evening.    Yes [provider]  glimepiride (AMARYL) 4 MG tablet Take 4 mg by mouth 2 (two) times daily with a meal.    Yes [provider]  hydrALAZINE (APRESOLINE) 100 MG tablet Take 1 tablet (100 mg total) by mouth 3 (three) times daily. 12/16/17  Yes Leonie Man, MD  isosorbide mononitrate (IMDUR) 60 MG 24 hr tablet Take 1 tablet (60 mg total) by mouth 2 (two) times daily. 01/28/18  Yes Eulogio Bear U, DO  metFORMIN (GLUCOPHAGE) 1000 MG tablet Take 1 tablet (1,000 mg total) by mouth 2 (two) times daily. 03/29/16  Yes  Isaiah Serge, NP  nitroGLYCERIN (NITROSTAT) 0.4 MG SL tablet Place 1 tablet (0.4 mg total) under the tongue every 5 (five) minutes as  needed for chest pain (up to 3 doses). 08/25/17  Yes Almyra Deforest, PA  oxybutynin (DITROPAN) 5 MG tablet Take 5 mg by mouth daily as needed for bladder spasms.  09/07/17  Yes [provider]  losartan-hydrochlorothiazide (HYZAAR) 100-25 MG tablet Take 1 tablet by mouth every evening. Patient not taking: Reported on 04/16/2018 12/16/17   Leonie Man, MD    Family History Family History  Problem Relation Age of Onset  . Heart Problems Father 71    Social History Social History   Tobacco Use  . Smoking status: Former Smoker    Packs/day: 0.33    Years: 10.00    Pack years: 3.30    Types: Cigarettes    Last attempt to quit: 1961    Years since quitting: 58.9  . Smokeless tobacco: Never Used  Substance Use Topics  . Alcohol use: No  . Drug use: No     Allergies   Other; Statins; Atorvastatin; Crestor [rosuvastatin]; Pravastatin; and Simvastatin   Review of Systems Review of Systems  Constitutional: Negative for diaphoresis.  Respiratory: Positive for shortness of breath.   Cardiovascular: Positive for leg swelling (chronic, unchanged). Negative for chest pain.  Gastrointestinal: Negative for abdominal pain, nausea and vomiting.  Musculoskeletal: Negative for back pain.  All other systems reviewed and are negative.    Physical Exam Updated Vital Signs BP (!) 160/73   Pulse 62   Temp 97.9 F (36.6 C) (Oral)   Resp 17   Ht 6\' 1"  (1.854 m)   Wt 98.4 kg   SpO2 98%   BMI 28.63 kg/m   Physical Exam  Constitutional: He appears well-developed and well-nourished. No distress.  HENT:  Head: Normocephalic and atraumatic.  Right Ear: External ear normal.  Left Ear: External ear normal.  Nose: Nose normal.  Eyes: Right eye exhibits no discharge. Left eye exhibits no discharge.  Neck: Neck supple.  Cardiovascular: Normal rate,  regular rhythm and normal heart sounds.  Pulses:      Radial pulses are 2+ on the right side, and 2+ on the left side.  Pulmonary/Chest: Effort normal and breath sounds normal. He exhibits no tenderness.  Abdominal: Soft. There is no tenderness.  Musculoskeletal: He exhibits edema (nonpitting in lower extremities).  Neurological: He is alert.  Skin: Skin is warm and dry. He is not diaphoretic.  Psychiatric: His mood appears not anxious.  Nursing note and vitals reviewed.    ED Treatments / Results  Labs (all labs ordered are listed, but only abnormal results are displayed) Labs Reviewed  I-STAT TROPONIN, ED - Abnormal; Notable for the following components:      Result Value   Troponin i, poc 1.10 (*)    All other components within normal limits  BASIC METABOLIC PANEL  CBC  TROPONIN I  HEPARIN LEVEL (UNFRACTIONATED)    EKG EKG Interpretation  Date/Time:  Saturday April 16 2018 21:28:54 EST Ventricular Rate:  64 PR Interval:    QRS Duration: 98 QT Interval:  367 QTC Calculation: 379 R Axis:   -21 Text Interpretation:  Sinus rhythm Atrial premature complex Prolonged PR interval Borderline left axis deviation Borderline low voltage, extremity leads Nonspecific T abnormalities, lateral leads T wave abnormalities similar to Sept 2019 Confirmed by Sherwood Gambler [phone]) on 04/16/2018 9:31:28 PM   EKG Interpretation  Date/Time:  Saturday April 16 2018 22:20:45 EST Ventricular Rate:  60 PR Interval:    QRS Duration: 96 QT Interval:  399 QTC Calculation: 399 R Axis:   -  26 Text Interpretation:  Sinus rhythm Atrial premature complex Prolonged PR interval Inferior infarct, old Consider anterior infarct no significant change since earlier in the day Confirmed by Sherwood Gambler [phone]) on 04/16/2018 10:42:35 PM        Radiology Dg Chest 2 View  Result Date: 04/16/2018 CLINICAL DATA:  Initial evaluation for acute left-sided chest pain. EXAM: CHEST - 2 VIEW COMPARISON:   Prior radiograph from 01/28/2018. FINDINGS: Median sternotomy wires with underlying CABG markers noted. Stable cardiomegaly. Mediastinal silhouette unchanged, and remains within normal limits. Aortic atherosclerosis. Lungs mildly hypoinflated. Mild scattered bibasilar linear atelectatic changes with mild interstitial congestion. No frank pulmonary edema. No pleural effusion. No consolidative airspace disease. No pneumothorax. No acute osseus abnormality. Prominent degenerative changes noted about the partially visualized shoulders. IMPRESSION: 1. Stable cardiomegaly with mild interstitial congestion without frank pulmonary edema. 2. Superimposed mild scattered bibasilar atelectasis. No other active cardiopulmonary disease. 3. Sequelae of prior CABG with aortic atherosclerosis. Electronically Signed   By: Jeannine Boga M.D.   On: 04/16/2018 22:18    Procedures .Critical Care Performed by: Sherwood Gambler, MD Authorized by: Sherwood Gambler, MD   Critical care provider statement:    Critical care time (minutes):  35   Critical care time was exclusive of:  Separately billable procedures and treating other patients   Critical care was necessary to treat or prevent imminent or life-threatening deterioration of the following conditions:  Cardiac failure   Critical care was time spent personally by me on the following activities:  Development of treatment plan with patient or surrogate, discussions with consultants, evaluation of patient's response to treatment, examination of patient, obtaining history from patient or surrogate, ordering and performing treatments and interventions, ordering and review of laboratory studies, ordering and review of radiographic studies, re-evaluation of patient's condition, pulse oximetry and review of old charts   (including critical care time)  Medications Ordered in ED Medications  nitroGLYCERIN (NITROSTAT) SL tablet 0.4 mg (has no administration in time range)    heparin ADULT infusion 100 units/mL (25000 units/228mL sodium chloride 0.45%) (has no administration in time range)  heparin bolus via infusion 4,000 Units (has no administration in time range)     Initial Impression / Assessment and Plan / ED Course  I have reviewed the triage vital signs and the nursing notes.  Pertinent labs & imaging results that were available during my care of the patient were reviewed by me and considered in my medical decision making (see chart for details).     Patient will be given nitroglycerin given his active chest pain.  His troponin is 1.10.  He is hypertensive here but not nearly enough to suggest this is all hypertensive emergency.  He will be placed on IV heparin.  I discussed with cardiology who has seen the patient but given other medical complexities as well as a severe elevated blood pressure over 078 systolic earlier in the day, he recommends hospitalist admission for further medical control.  Final Clinical Impressions(s) / ED Diagnoses   Final diagnoses:  NSTEMI (non-ST elevated myocardial infarction) [facility])    ED Discharge Orders    None       Sherwood Gambler, MD 04/16/18 2339

## 2018-04-16 NOTE — Discharge Instructions (Signed)
Follow up with your urologist as needed for further issues with your catheter. Otherwise, follow-up at your regular 1 month appointment for Foley change. Return to the emergency room with any new, worsening, concerning symptoms.

## 2018-04-16 NOTE — ED Triage Notes (Signed)
Onset 3 hours ago developed lower middle abdominal pain around suprapubic catheter. States it was draining however today feels the need to urinate but can't.

## 2018-04-16 NOTE — ED Provider Notes (Signed)
  Face-to-face evaluation   History:   Physical exam:   SUPRAPUBIC ASPIRATION Date/Time: 04/16/2018 2:40 PM Performed by: Daleen Bo, MD Authorized by: Daleen Bo, MD   Consent:    Consent obtained:  Verbal   Consent given by:  Patient   Risks discussed:  Pain   Alternatives discussed:  No treatment Anesthesia (see MAR for exact dosages):    Anesthesia method:  None Procedure details:    Suprapubic aspiration by:  Catheter   Needle gauge:  16 G   Number of attempts:  1   Aspirate characteristics:  Blood-tinged Post-procedure details:    Patient tolerance of procedure:  Tolerated well, no immediate complications    Medical screening examination/treatment/procedure(s) were conducted as a shared visit with non-physician practitioner(s) and myself.  I personally evaluated the patient during the encounter    Daleen Bo, MD 04/17/18 [phone]

## 2018-04-16 NOTE — Consult Note (Signed)
[facility] Consult Note   Primary Physician:   Suzan Garibaldi, [place] Primary Cardiologist:    Glenetta Hew, MD  Reason for Consultation:  Chest pain  HPI:    [name] is an 84 year old gentleman with a past medical history significant for extensive coronary artery disease (two-vessel CABG in 2012 with LIMA to LAD and SVG to PDA), chronic diastolic heart failure, frequent urinary tract infections, hyperlipidemia, stage III chronic kidney disease, urethral stricture and subsequent suprapubic catheter placement, type 2 diabetes mellitus, essential hypertension, left MCA CVA, AAA, chronic lower extremity edema, postop atrial fibrillation (resolved), osteoarthritis and obstructive sleep apnea (not on CPAP) -who presents to the hospital with complaints of left-sided chest pain since the afternoon.  Earlier today he had presented with an obstruction in his urinary catheter.  This required removal of the old one and replacement with a new catheter.  Because of his abdominal pain his blood pressure was greater than 938 mmHg systolic.  After returning to his house he had worsening left-sided chest pain.  He denies any diaphoresis, nausea or vomiting.  As the pain continued to worsen he eventually decided to present to the hospital.  Back in September 2019 he also had experienced chest pain in the setting of his systolic blood pressure being greater than 200.  At that time his Imdur was increased to 60 mg twice daily.   Since his coronary artery bypass grafting he has had drug-eluting stents placed to the SVG to PDA (in 2016 and then in 2017).  His last cardiac catheterization in April 2019 revealed severe three-vessel CAD with newly occluded mid RCA, 70% proximal LAD, patent LIMA to LAD and patent SVG to RPDA.  Aggressive medical therapy was advised.    Recent cardiac testing Echocardiogram - 08/28/2017 Study Conclusions - Left ventricle: The cavity size was normal. Wall thickness was  increased in a pattern of mild LVH. Systolic function was normal.   The estimated ejection fraction was in the range of 60% to 65%.   Wall motion was normal; there were no regional wall motion   abnormalities. Doppler parameters are consistent with abnormal   left ventricular relaxation (grade 1 diastolic dysfunction).   Doppler parameters are consistent with indeterminate ventricular   filling pressure. - Aortic valve: Transvalvular velocity was within the normal range.   There was no stenosis. There was no regurgitation. - Aorta: Ascending aortic diameter: 43 mm (S). - Ascending aorta: The ascending aorta was mildly dilated. - Mitral valve: Transvalvular velocity was within the normal range.   There was no evidence for stenosis. There was no regurgitation. - Left atrium: The atrium was moderately dilated. - Right ventricle: The cavity size was normal. Wall thickness was   normal. Systolic function was normal. - Atrial septum: No defect or patent foramen ovale was identified   by color flow Doppler. - Tricuspid valve: There was trivial regurgitation. - Pulmonary arteries: Systolic pressure was within the normal   range. PA peak pressure: 21 mm Hg (S).     Home Medications Prior to Admission medications   Medication Sig Start Date End Date Taking? Authorizing Provider  amLODipine (NORVASC) 10 MG tablet Take 10 mg by mouth every evening.    Yes [provider]  aspirin 81 MG chewable tablet Chew 1 tablet (81 mg total) by mouth daily. 03/29/16  Yes Isaiah Serge, NP  carvedilol (COREG) 12.5 MG tablet Take 1/2 tablet ( 6.25 mg ) twice a day Patient taking differently:  Take 6.25 mg by mouth 2 (two) times daily with a meal. day 02/15/18  Yes Barrett, Evelene Croon, PA-C  clopidogrel (PLAVIX) 75 MG tablet Take 1 tablet (75 mg total) by mouth daily. 09/22/17  Yes Croitoru, Mihai, MD  furosemide (LASIX) 20 MG tablet Take 20 mg by mouth every evening.    Yes [provider]    glimepiride (AMARYL) 4 MG tablet Take 4 mg by mouth 2 (two) times daily with a meal.    Yes [provider]  hydrALAZINE (APRESOLINE) 100 MG tablet Take 1 tablet (100 mg total) by mouth 3 (three) times daily. 12/16/17  Yes Leonie Man, MD  isosorbide mononitrate (IMDUR) 60 MG 24 hr tablet Take 1 tablet (60 mg total) by mouth 2 (two) times daily. 01/28/18  Yes Eulogio Bear U, DO  metFORMIN (GLUCOPHAGE) 1000 MG tablet Take 1 tablet (1,000 mg total) by mouth 2 (two) times daily. 03/29/16  Yes Isaiah Serge, NP  nitroGLYCERIN (NITROSTAT) 0.4 MG SL tablet Place 1 tablet (0.4 mg total) under the tongue every 5 (five) minutes as needed for chest pain (up to 3 doses). 08/25/17  Yes Almyra Deforest, PA  oxybutynin (DITROPAN) 5 MG tablet Take 5 mg by mouth daily as needed for bladder spasms.  09/07/17  Yes [provider]  losartan-hydrochlorothiazide (HYZAAR) 100-25 MG tablet Take 1 tablet by mouth every evening. Patient not taking: Reported on 04/16/2018 12/16/17   Leonie Man, MD    Past Medical History: Past Medical History:  Diagnosis Date  . Abdominal aortic aneurysm ([place])    a. Aortic duplex 06/2014: mild aneurysmal dilatation of proximal abdominal aorta measuring 3.4x3.4cm. No sig change from 2012. F/u due 06/2016;   . AKI (acute kidney injury) ([place]) 03/28/2016  . Arthritis    "right knee; never bothered me" (03/26/2016)  . 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 CAD in native artery    a. NSTEMI 11/2010 - CABG x2(LIMA to LAD, SVG to PDA). b. NEG Lexi MV 10/24/13, EF 53%, no perfusion abnormality, septal and apical HK noted. c. NSTEMI 05/2014 - s/p DES to SVG-RPDA 06/18/14 (Xience Alpine DES 3.0 x 18 mm -3.35 mm), EF 60-65; d. 03/2016 STEMI/PCI: LM nl, ost LAD 70%, mLCx 50%, pRCA 95% - mRCA 60%, dRPDA 70%, LIMA->LAD ok, o-p SVG->RPDA 100% (3.0x16 Promus DES overlapping prior stent).  . Diastolic dysfunction    a. 03/2016 Echo: EF 60-65%, no rwma,  Gr1 DD, triv AI, Ao root 11mm, Asc Ao 19mm, triv MR.  . Dizziness    a. Carotid duplex 03/2014: mild fibrous plaque, no significant stenosis.  . Foley catheter in place    "been wearing it for a couple months now" (03/26/2016)  . HTN (hypertension)   . Hyperlipidemia LDL goal <70   . Non-STEMI (non-ST elevated myocardial infarction) ([place]) 11/2010   1. Ostial LAD 70% (to close to [facility] for PCI), subtotal occlusion of the RCA  . Postoperative atrial fibrillation ([place]) 11/2010   Post CABG, no sign recurrence  . Refusal of blood transfusions as patient is Jehovah's Witness   . 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 Servando Snare)  . Sleep apnea    Not on CPAP. (03/26/2016)  . ST elevation myocardial infarction (STEMI) of inferior wall ([place]) 04/05/2016   Occluded in-stent restenosis/thrombosis of SVG-RCA --> treated with overlapping Promus DES 3.0 mm 16 mm postdilated 3.0 mm).  . Stroke ([place]) 2014;  01/2015   a. 2014 with mild right hand weakness, nonhemorrhagic per pt.;; b. - PTA-Stent L ICA 95%   . Type II diabetes mellitus ([place])     Past Surgical History: Past Surgical History:  Procedure Laterality Date  . APPENDECTOMY    . CARDIAC CATHETERIZATION  12/11/2010   Dr. Chase Picket - subsequent cath - normal LV systolic function, no renal artery stenosis, severe 2-vessel disease with subtotaled RCA prox and distal 60% lesion and complex 70% area of narrowing of ostium of LAD  . CARDIAC CATHETERIZATION N/A 03/26/2016   Procedure: Left Heart Cath and Coronary Angiography;  Surgeon: Nelva Bush, MD;  Location: [place] [facility];  Service: Cardiovascular;  Laterality: N/A;  . CARDIAC CATHETERIZATION N/A 03/26/2016   Procedure: Coronary Stent Intervention;  Surgeon: Nelva Bush, MD;  Location: [place] [facility];  Service: Cardiovascular: 100% In-stent thrombosis of pros SVG-RCA (Xience DES) --> treated with PromusDES 3.0 x 18 (3.3 mm)  . CARDIAC CATHETERIZATION N/A 03/26/2016    Procedure: Bypass Graft Angiography;  Surgeon: Nelva Bush, MD;  Location: [place] [facility];  Service: Cardiovascular;  Laterality: N/A;  . CORONARY ANGIOPLASTY WITH STENT PLACEMENT  06/18/2014   PCI to SVG-RPDA 06/18/14 (Xience Alpine DES 3.0 x 18 mm -3.35 mm),   . CORONARY ARTERY BYPASS GRAFT  12/15/2010   X2, Dr Servando Snare; LIMA to LAD, SVG to PDA;   . CORONARY/GRAFT ANGIOGRAPHY N/A 08/27/2017   Procedure: CORONARY/GRAFT ANGIOGRAPHY;  Surgeon: Nelva Bush, MD;  Location: [place] [facility];  Service: Cardiovascular;; pLAD 70%, ostD1 50%.  mCx 60%, OM2 80%. pRCA 95% & mRCA 100% - rPDA 70%. LIMA-mLAD patent. SVG-rPDA 10% ISR.   Marland Kitchen CYSTOSCOPY WITH URETHRAL DILATATION    . IR ANGIO INTRA EXTRACRAN SEL COM CAROTID INNOMINATE BILAT MOD SED  02/25/2017  . IR ANGIO INTRA EXTRACRAN SEL COM CAROTID INNOMINATE BILAT MOD SED  10/19/2017   Dr. Kathee Delton: L Common Carotid - ECA & major branches widely patent. ICA ~20% distal to bulb & 50% in supraclinoid segment. LMCA-distal 1/3 MI ~905 stenosis with post-stenotic dilation into inferior division. ~50% prox Basilar A stenosis @ anterior Inf Cerebellar A. 50% R ICA  . IR ANGIO VERTEBRAL SEL SUBCLAVIAN INNOMINATE UNI L MOD SED  02/25/2017  . IR ANGIO VERTEBRAL SEL SUBCLAVIAN INNOMINATE UNI R MOD SED  10/19/2017  . IR ANGIO VERTEBRAL SEL VERTEBRAL UNI L MOD SED  10/19/2017  . IR GENERIC HISTORICAL  01/21/2016   IR RADIOLOGIST EVAL & MGMT 01/21/2016 [facility]  . IR GENERIC HISTORICAL  02/03/2016   IR CATHETER TUBE CHANGE 02/03/2016 Marybelle Killings, MD [facility]  . IR RADIOLOGIST EVAL & MGMT  11/09/2017  . LEFT HEART CATHETERIZATION WITH CORONARY ANGIOGRAM N/A 06/18/2014   Procedure: LEFT HEART CATHETERIZATION WITH CORONARY ANGIOGRAM;  Surgeon: Leonie Man, MD;  Location: [facility];  Service: Cardiovascular;  -- severe disease of SVG-rPDA  . RADIOLOGY WITH ANESTHESIA N/A 01/24/2015   Procedure: STENT ASSISTED ANGIOPLASTY (RADIOLOGY WITH ANESTHESIA);  Surgeon:  Luanne Bras, MD;  Location: [place];  Service: Radiology;  Laterality: N/A;  . TONSILLECTOMY    . TRANSTHORACIC ECHOCARDIOGRAM  3/'14; 11/'16:11/'17   a. EF 55-60%, severe Conc LVH, Nl Systolic fxn, G1 DD,  Ao Sclerosis w/o stenosis; CRO PFO;; b. Moderate concentric LVH. EF 60-65%. No RWMA, Gr 1 DD. Mild LA dilation;; c) HK of the distal inferior wall. EF 60-65%.  GR 1 DD. Ascending aorta/root 45-46 mm.  . TRANSTHORACIC ECHOCARDIOGRAM  08/2017  EF 60-65%. Mild LVH. No RWMA. Gr 1 DD. Mod LA dilation.     Family History: Family History  Problem Relation Age of Onset  . Heart Problems Father 49    Social History: Social History   Socioeconomic History  . Marital status: Married    Spouse name: [name]  . Number of children: 1  . Years of education: Not on file  . Highest education level: Not on file  Occupational History    Employer: RETIRED  Social Needs  . Financial resource strain: Not very hard  . Food insecurity:    Worry: Never true    Inability: Never true  . Transportation needs:    Medical: No    Non-medical: No  Tobacco Use  . Smoking status: Former Smoker    Packs/day: 0.33    Years: 10.00    Pack years: 3.30    Types: Cigarettes    Last attempt to quit: 1961    Years since quitting: 58.9  . Smokeless tobacco: Never Used  Substance and Sexual Activity  . Alcohol use: No  . Drug use: No  . Sexual activity: Never  Lifestyle  . Physical activity:    Days per week: 6 days    Minutes per session: 30 min  . Stress: Not at all  Relationships  . Social connections:    Talks on phone: More than three times a week    Gets together: More than three times a week    Attends religious service: 1 to 4 times per year    Active member of club or organization: Yes    Attends meetings of clubs or organizations: 1 to 4 times per year    Relationship status: Married  Other Topics Concern  . Not on file  Social History Narrative   Married father of one.  Previously  uses stationary bike routinely.  Now reduced due to other social stressors.   Quit smoking 50 years ago.  Does not drink alcohol    Allergies:  Allergies  Allergen Reactions  . Other Other (See Comments)    No  BLOOD PRODUCTS - Pt is Jehovah's Witness  . Statins Other (See Comments)    Cause muscle aches  . Atorvastatin Other (See Comments)    Myalgias  . Crestor [Rosuvastatin] Other (See Comments)    Myalgias   . Pravastatin Other (See Comments)    Myalgias  . Simvastatin Other (See Comments)    Myalgias      Review of Systems: [y] = yes, [ ]  = no   . General: Weight gain [ ] ; Weight loss [ ] ; Anorexia [ ] ; Fatigue [ ] ; Fever [ ] ; Chills [ ] ; Weakness [ ]   . Cardiac: Chest pain/pressure Blue.Reese ]; Resting SOB [ ] ; Exertional SOB [ ] ; Orthopnea [ ] ; Pedal Edema Blue.Reese ]; Palpitations [ ] ; Syncope [ ] ; Presyncope [ ] ; Paroxysmal nocturnal dyspnea[ ]   . Pulmonary: Cough [ ] ; Wheezing[ ] ; Hemoptysis[ ] ; Sputum [ ] ; Snoring [ ]   . GI: Vomiting[ ] ; Dysphagia[ ] ; Melena[ ] ; Hematochezia [ ] ; Heartburn[ ] ; Abdominal pain Blue.Reese ]; Constipation [ ] ; Diarrhea [ ] ; BRBPR [ ]   . GU: Hematuria[y ]; Dysuria [ ] ; Nocturia[ ]   . Vascular: Pain in legs with walking [ ] ; Pain in feet with lying flat [ ] ; Non-healing sores [ ] ; Stroke [ ] ; TIA [ ] ; Slurred speech [ ] ;  . Neuro: Headaches[ ] ; Vertigo[ ] ; Seizures[ ] ; Paresthesias[ ] ;Blurred vision [ ] ; Diplopia [ ] ;  Vision changes [ ]   . Ortho/Skin: Arthritis [ ] ; Joint pain [ ] ; Muscle pain [ ] ; Joint swelling [ ] ; Back Pain [ ] ; Rash [ ]   . Psych: Depression[ ] ; Anxiety[ ]   . Heme: Bleeding problems [ ] ; Clotting disorders [ ] ; Anemia [ ]   . Endocrine: Diabetes [ ] ; Thyroid dysfunction[ ]      Objective:    Vital Signs:   Temp:  [97.9 F (36.6 C)] 97.9 F (36.6 C) (11/30 2130) Pulse Rate:  [57-88] 62 (11/30 2245) Resp:  [17-29] 17 (11/30 2245) BP: (154-223)/(68-105) 160/73 (11/30 2245) SpO2:  [93 %-99 %] 98 % (11/30 2245) Weight:  [97.5 kg-98.4 kg]  98.4 kg (11/30 2131)    Weight change: Filed Weights   04/16/18 2131  Weight: 98.4 kg    Intake/Output:  No intake or output data in the 24 hours ending 04/16/18 2250    Physical Exam    General:  Well appearing. No resp difficulty HEENT: normal Neck: supple. JVP . Carotids 2+ bilat; no bruits. No lymphadenopathy or thyromegaly appreciated. Cor: PMI nondisplaced. Regular rate & rhythm. No rubs, gallops or murmurs. Lungs: clear to auscultation bilaterally Abdomen: soft, nontender, nondistended. No hepatosplenomegaly. No bruits or masses.  Extremities: no cyanosis, clubbing, rash, edema Neuro: alert & orientedx3, cranial nerves grossly intact.  Affect pleasant    EKG    Sinus rhythm, rate 60 bpm, old inferior infarct, poor R wave progression in the anterior leads   Labs   Basic Metabolic Panel: No results for input(s): NA, K, CL, CO2, GLUCOSE, BUN, CREATININE, CALCIUM, MG, PHOS in the last 168 hours.  Liver Function Tests: No results for input(s): AST, ALT, ALKPHOS, BILITOT, PROT, ALBUMIN in the last 168 hours. No results for input(s): LIPASE, AMYLASE in the last 168 hours. No results for input(s): AMMONIA in the last 168 hours.  CBC: No results for input(s): WBC, NEUTROABS, HGB, HCT, MCV, PLT in the last 168 hours.  Cardiac Enzymes: No results for input(s): CKTOTAL, CKMB, CKMBINDEX, TROPONINI in the last 168 hours.  BNP: BNP (last 3 results) Recent Labs    12/13/17 1420  BNP 190.3*    ProBNP (last 3 results) No results for input(s): PROBNP in the last 8760 hours.   CBG: No results for input(s): GLUCAP in the last 168 hours.  Coagulation Studies: No results for input(s): LABPROT, INR in the last 72 hours.   Imaging    Dg Chest 2 View - Result Date: 04/16/2018 FINDINGS: Median sternotomy wires with underlying CABG markers noted. Stable cardiomegaly. Mediastinal silhouette unchanged, and remains within normal limits. Aortic atherosclerosis. Lungs  mildly hypoinflated. Mild scattered bibasilar linear atelectatic changes with mild interstitial congestion. No frank pulmonary edema. No pleural effusion. No consolidative airspace disease. No pneumothorax. No acute osseus abnormality. Prominent degenerative changes noted about the partially visualized shoulders. IMPRESSION:  1. Stable cardiomegaly with mild interstitial congestion without frank pulmonary edema.  2. Superimposed mild scattered bibasilar atelectasis. No other active cardiopulmonary disease.  3. Sequelae of prior CABG with aortic atherosclerosis.      Medications:     Current Medications: . heparin  4,000 Units Intravenous Once     Infusions: . heparin         Assessment/Plan   1. Non-ST elevation myocardial infarction  Likely demand ischemia in the setting of abdominal pain, urinary catheter obstruction and hypertensive urgency earlier today. He reports having bleeding at the time of catheter replacement. Still has blood-tinged urine coming out but is overall clearer.  Chest pain is 4/10 and improving slightly.  -Continue aspirin at 81 mg daily -Cycle cardiac biomarkers -Obtain serial ECGs -Carvedilol 12.5 mg twice daily -Plavix 75 mg daily -Continue hydralazine, isosorbide mononitrate, losartan-hydrochlorothiazide and amlodipine -Would avoid statins since he has a stated allergy to that drug class -Can consider intravenous unfractionated heparin if his hematuria is resolving -Can repeat a transthoracic echo to reevaluate his LV systolic and diastolic function    Meade Maw, MD  04/16/2018, 10:50 PM  Cardiology Overnight Team Please contact [facility] for night-coverage after hours (4p -7a ) and weekends on [email or web address]

## 2018-04-17 ENCOUNTER — Encounter (HOSPITAL_COMMUNITY): Payer: Self-pay

## 2018-04-17 ENCOUNTER — Observation Stay (HOSPITAL_BASED_OUTPATIENT_CLINIC_OR_DEPARTMENT_OTHER): Payer: Medicare Other

## 2018-04-17 DIAGNOSIS — Z79899 Other long term (current) drug therapy: Secondary | ICD-10-CM | POA: Diagnosis not present

## 2018-04-17 DIAGNOSIS — D72829 Elevated white blood cell count, unspecified: Secondary | ICD-10-CM

## 2018-04-17 DIAGNOSIS — E669 Obesity, unspecified: Secondary | ICD-10-CM | POA: Diagnosis not present

## 2018-04-17 DIAGNOSIS — Z6828 Body mass index (BMI) 28.0-28.9, adult: Secondary | ICD-10-CM | POA: Diagnosis not present

## 2018-04-17 DIAGNOSIS — E1169 Type 2 diabetes mellitus with other specified complication: Secondary | ICD-10-CM | POA: Diagnosis not present

## 2018-04-17 DIAGNOSIS — M1711 Unilateral primary osteoarthritis, right knee: Secondary | ICD-10-CM | POA: Diagnosis present

## 2018-04-17 DIAGNOSIS — I10 Essential (primary) hypertension: Secondary | ICD-10-CM | POA: Diagnosis not present

## 2018-04-17 DIAGNOSIS — T82855A Stenosis of coronary artery stent, initial encounter: Secondary | ICD-10-CM | POA: Diagnosis present

## 2018-04-17 DIAGNOSIS — D649 Anemia, unspecified: Secondary | ICD-10-CM

## 2018-04-17 DIAGNOSIS — I5043 Acute on chronic combined systolic (congestive) and diastolic (congestive) heart failure: Secondary | ICD-10-CM | POA: Diagnosis not present

## 2018-04-17 DIAGNOSIS — E785 Hyperlipidemia, unspecified: Secondary | ICD-10-CM | POA: Diagnosis present

## 2018-04-17 DIAGNOSIS — Z7984 Long term (current) use of oral hypoglycemic drugs: Secondary | ICD-10-CM | POA: Diagnosis not present

## 2018-04-17 DIAGNOSIS — T83090A Other mechanical complication of cystostomy catheter, initial encounter: Secondary | ICD-10-CM | POA: Diagnosis not present

## 2018-04-17 DIAGNOSIS — Z87891 Personal history of nicotine dependence: Secondary | ICD-10-CM | POA: Diagnosis not present

## 2018-04-17 DIAGNOSIS — I110 Hypertensive heart disease with heart failure: Secondary | ICD-10-CM

## 2018-04-17 DIAGNOSIS — Z7982 Long term (current) use of aspirin: Secondary | ICD-10-CM | POA: Diagnosis not present

## 2018-04-17 DIAGNOSIS — Z9889 Other specified postprocedural states: Secondary | ICD-10-CM

## 2018-04-17 DIAGNOSIS — Z7902 Long term (current) use of antithrombotics/antiplatelets: Secondary | ICD-10-CM | POA: Diagnosis not present

## 2018-04-17 DIAGNOSIS — D631 Anemia in chronic kidney disease: Secondary | ICD-10-CM | POA: Diagnosis present

## 2018-04-17 DIAGNOSIS — Y738 Miscellaneous gastroenterology and urology devices associated with adverse incidents, not elsewhere classified: Secondary | ICD-10-CM | POA: Diagnosis present

## 2018-04-17 DIAGNOSIS — Z531 Procedure and treatment not carried out because of patient's decision for reasons of belief and group pressure: Secondary | ICD-10-CM | POA: Diagnosis present

## 2018-04-17 DIAGNOSIS — I25709 Atherosclerosis of coronary artery bypass graft(s), unspecified, with unspecified angina pectoris: Secondary | ICD-10-CM | POA: Diagnosis not present

## 2018-04-17 DIAGNOSIS — Z888 Allergy status to other drugs, medicaments and biological substances status: Secondary | ICD-10-CM | POA: Diagnosis not present

## 2018-04-17 DIAGNOSIS — I130 Hypertensive heart and chronic kidney disease with heart failure and stage 1 through stage 4 chronic kidney disease, or unspecified chronic kidney disease: Secondary | ICD-10-CM | POA: Diagnosis not present

## 2018-04-17 DIAGNOSIS — I361 Nonrheumatic tricuspid (valve) insufficiency: Secondary | ICD-10-CM

## 2018-04-17 DIAGNOSIS — Y831 Surgical operation with implant of artificial internal device as the cause of abnormal reaction of the patient, or of later complication, without mention of misadventure at the time of the procedure: Secondary | ICD-10-CM | POA: Diagnosis present

## 2018-04-17 DIAGNOSIS — I472 Ventricular tachycardia: Secondary | ICD-10-CM | POA: Diagnosis present

## 2018-04-17 DIAGNOSIS — E1122 Type 2 diabetes mellitus with diabetic chronic kidney disease: Secondary | ICD-10-CM | POA: Diagnosis present

## 2018-04-17 DIAGNOSIS — I5032 Chronic diastolic (congestive) heart failure: Secondary | ICD-10-CM

## 2018-04-17 DIAGNOSIS — Z8673 Personal history of transient ischemic attack (TIA), and cerebral infarction without residual deficits: Secondary | ICD-10-CM | POA: Diagnosis not present

## 2018-04-17 DIAGNOSIS — J9811 Atelectasis: Secondary | ICD-10-CM | POA: Diagnosis present

## 2018-04-17 DIAGNOSIS — N179 Acute kidney failure, unspecified: Secondary | ICD-10-CM | POA: Diagnosis present

## 2018-04-17 DIAGNOSIS — N183 Chronic kidney disease, stage 3 (moderate): Secondary | ICD-10-CM

## 2018-04-17 DIAGNOSIS — I714 Abdominal aortic aneurysm, without rupture: Secondary | ICD-10-CM | POA: Diagnosis present

## 2018-04-17 DIAGNOSIS — I214 Non-ST elevation (NSTEMI) myocardial infarction: Secondary | ICD-10-CM | POA: Diagnosis present

## 2018-04-17 HISTORY — DX: Elevated white blood cell count, unspecified: D72.829

## 2018-04-17 HISTORY — DX: Anemia, unspecified: D64.9

## 2018-04-17 LAB — GLUCOSE, CAPILLARY
GLUCOSE-CAPILLARY: 100 mg/dL — AB (ref 70–99)
Glucose-Capillary: 135 mg/dL — ABNORMAL HIGH (ref 70–99)
Glucose-Capillary: 97 mg/dL (ref 70–99)
Glucose-Capillary: 158 mg/dL — ABNORMAL HIGH (ref 70–99)
Glucose-Capillary: 238 mg/dL — ABNORMAL HIGH (ref 70–99)
Glucose-Capillary: 196 mg/dL — ABNORMAL HIGH (ref 70–99)

## 2018-04-17 LAB — HEMOGLOBIN A1C
HEMOGLOBIN A1C: 8.2 % — AB (ref 4.8–5.6)
Mean Plasma Glucose: 188.64 mg/dL

## 2018-04-17 LAB — URINALYSIS, ROUTINE W REFLEX MICROSCOPIC
Bilirubin Urine: NEGATIVE
Glucose, UA: NEGATIVE mg/dL
Ketones, ur: NEGATIVE mg/dL
Nitrite: NEGATIVE
Protein, ur: 100 mg/dL — AB
RBC / HPF: >50 RBC/hpf — ABNORMAL HIGH (ref 0–5)
Specific Gravity, Urine: 1.01 (ref 1.005–1.030)
pH: 7 (ref 5.0–8.0)

## 2018-04-17 LAB — BASIC METABOLIC PANEL
Anion gap: 11 (ref 5–15)
BUN: 28 mg/dL — ABNORMAL HIGH (ref 8–23)
CHLORIDE: 104 mmol/L (ref 98–111)
CO2: 23 mmol/L (ref 22–32)
Calcium: 8.8 mg/dL — ABNORMAL LOW (ref 8.9–10.3)
Creatinine, Ser: 1.35 mg/dL — ABNORMAL HIGH (ref 0.61–1.24)
GFR calc Af Amer: 55 mL/min — ABNORMAL LOW (ref >60)
GFR calc non Af Amer: 48 mL/min — ABNORMAL LOW (ref >60)
Glucose, Bld: 135 mg/dL — ABNORMAL HIGH (ref 70–99)
POTASSIUM: 3.6 mmol/L (ref 3.5–5.1)
Sodium: 138 mmol/L (ref 135–145)

## 2018-04-17 LAB — CBC
HCT: 28.3 % — ABNORMAL LOW (ref 39.0–52.0)
Hemoglobin: 9.1 g/dL — ABNORMAL LOW (ref 13.0–17.0)
MCH: 28.2 pg (ref 26.0–34.0)
MCHC: 32.2 g/dL (ref 30.0–36.0)
MCV: 87.6 fL (ref 80.0–100.0)
Platelets: 276 10*3/uL (ref 150–400)
RBC: 3.23 MIL/uL — ABNORMAL LOW (ref 4.22–5.81)
RDW: 18.2 % — ABNORMAL HIGH (ref 11.5–15.5)
WBC: 10.8 10*3/uL — ABNORMAL HIGH (ref 4.0–10.5)
nRBC: 0 % (ref 0.0–0.2)

## 2018-04-17 LAB — MRSA PCR SCREENING: MRSA by PCR: NEGATIVE

## 2018-04-17 LAB — TROPONIN I
Troponin I: 1.65 ng/mL (ref <0.03)
Troponin I: 1.95 ng/mL (ref <0.03)
Troponin I: 4.68 ng/mL (ref <0.03)
Troponin I: 7.39 ng/mL (ref <0.03)

## 2018-04-17 LAB — ECHOCARDIOGRAM COMPLETE
Height: 73 in
Weight: 3379.21 oz

## 2018-04-17 LAB — HEPARIN LEVEL (UNFRACTIONATED)
Heparin Unfractionated: 0.24 IU/mL — ABNORMAL LOW (ref 0.30–0.70)
Heparin Unfractionated: 0.28 IU/mL — ABNORMAL LOW (ref 0.30–0.70)

## 2018-04-17 MED ORDER — NITROGLYCERIN 0.4 MG SL SUBL
0.4000 mg | SUBLINGUAL_TABLET | SUBLINGUAL | Status: DC | PRN
  Administered 2018-04-17 (×3): 0.4 mg via SUBLINGUAL

## 2018-04-17 MED ORDER — ASPIRIN 81 MG PO CHEW
CHEWABLE_TABLET | ORAL | Status: AC
  Administered 2018-04-17: 81 mg via ORAL
  Filled 2018-04-17: qty 1

## 2018-04-17 MED ORDER — ISOSORBIDE MONONITRATE ER 60 MG PO TB24
60.0000 mg | ORAL_TABLET | Freq: Two times a day (BID) | ORAL | Status: DC
  Administered 2018-04-17 (×2): 60 mg via ORAL
  Filled 2018-04-17 (×2): qty 1

## 2018-04-17 MED ORDER — MORPHINE SULFATE (PF) 2 MG/ML IV SOLN
2.0000 mg | Freq: Once | INTRAVENOUS | Status: AC
  Administered 2018-04-17: 2 mg via INTRAVENOUS

## 2018-04-17 MED ORDER — INSULIN ASPART 100 UNIT/ML ~~LOC~~ SOLN
0.0000 [IU] | Freq: Three times a day (TID) | SUBCUTANEOUS | Status: DC
  Administered 2018-04-17: 2 [IU] via SUBCUTANEOUS
  Administered 2018-04-17 – 2018-04-18 (×2): 3 [IU] via SUBCUTANEOUS
  Administered 2018-04-19: 2 [IU] via SUBCUTANEOUS
  Administered 2018-04-19: 1 [IU] via SUBCUTANEOUS
  Administered 2018-04-19: 3 [IU] via SUBCUTANEOUS
  Administered 2018-04-20: 5 [IU] via SUBCUTANEOUS
  Administered 2018-04-21: 1 [IU] via SUBCUTANEOUS
  Administered 2018-04-21: 5 [IU] via SUBCUTANEOUS

## 2018-04-17 MED ORDER — NITROGLYCERIN 0.4 MG SL SUBL
SUBLINGUAL_TABLET | SUBLINGUAL | Status: AC
  Filled 2018-04-17: qty 1

## 2018-04-17 MED ORDER — NITROGLYCERIN IN D5W 200-5 MCG/ML-% IV SOLN
0.0000 ug/min | INTRAVENOUS | Status: DC
  Administered 2018-04-17: 5 ug/min via INTRAVENOUS

## 2018-04-17 MED ORDER — MORPHINE SULFATE (PF) 2 MG/ML IV SOLN
INTRAVENOUS | Status: AC
  Filled 2018-04-17: qty 1

## 2018-04-17 MED ORDER — MORPHINE SULFATE (PF) 2 MG/ML IV SOLN
2.0000 mg | INTRAVENOUS | Status: DC | PRN
  Administered 2018-04-17: 2 mg via INTRAVENOUS
  Filled 2018-04-17: qty 1

## 2018-04-17 MED ORDER — ACETAMINOPHEN 325 MG PO TABS
650.0000 mg | ORAL_TABLET | ORAL | Status: DC | PRN
  Administered 2018-04-17: 650 mg via ORAL
  Filled 2018-04-17: qty 2

## 2018-04-17 MED ORDER — DOXAZOSIN MESYLATE 1 MG PO TABS
1.0000 mg | ORAL_TABLET | Freq: Every day | ORAL | Status: DC
  Administered 2018-04-17 – 2018-04-21 (×5): 1 mg via ORAL
  Filled 2018-04-17 (×5): qty 1

## 2018-04-17 MED ORDER — NITROGLYCERIN 0.4 MG SL SUBL
SUBLINGUAL_TABLET | SUBLINGUAL | Status: AC
  Filled 2018-04-17: qty 3

## 2018-04-17 MED ORDER — HYDRALAZINE HCL 50 MG PO TABS
100.0000 mg | ORAL_TABLET | Freq: Three times a day (TID) | ORAL | Status: DC
  Administered 2018-04-17 – 2018-04-21 (×14): 100 mg via ORAL
  Filled 2018-04-17 (×14): qty 2

## 2018-04-17 MED ORDER — ASPIRIN 81 MG PO CHEW
81.0000 mg | CHEWABLE_TABLET | Freq: Every day | ORAL | Status: DC
  Administered 2018-04-17 – 2018-04-21 (×5): 81 mg via ORAL
  Filled 2018-04-17 (×4): qty 1

## 2018-04-17 MED ORDER — PERFLUTREN LIPID MICROSPHERE
1.0000 mL | INTRAVENOUS | Status: AC | PRN
  Administered 2018-04-17: 2 mL via INTRAVENOUS
  Filled 2018-04-17: qty 10

## 2018-04-17 MED ORDER — AMLODIPINE BESYLATE 10 MG PO TABS
10.0000 mg | ORAL_TABLET | Freq: Every evening | ORAL | Status: DC
  Administered 2018-04-17 – 2018-04-20 (×4): 10 mg via ORAL
  Filled 2018-04-17 (×4): qty 1

## 2018-04-17 MED ORDER — OXYBUTYNIN CHLORIDE 5 MG PO TABS
5.0000 mg | ORAL_TABLET | Freq: Every day | ORAL | Status: DC | PRN
  Filled 2018-04-17: qty 1

## 2018-04-17 MED ORDER — CLOPIDOGREL BISULFATE 75 MG PO TABS
75.0000 mg | ORAL_TABLET | Freq: Every day | ORAL | Status: DC
  Administered 2018-04-17 – 2018-04-21 (×5): 75 mg via ORAL
  Filled 2018-04-17 (×5): qty 1

## 2018-04-17 MED ORDER — CARVEDILOL 12.5 MG PO TABS
12.5000 mg | ORAL_TABLET | Freq: Two times a day (BID) | ORAL | Status: DC
  Administered 2018-04-17 – 2018-04-19 (×5): 12.5 mg via ORAL
  Filled 2018-04-17 (×6): qty 1

## 2018-04-17 NOTE — Progress Notes (Signed)
[place] for heparin Indication: chest pain/ACS  Allergies  Allergen Reactions  . Other Other (See Comments)    No  BLOOD PRODUCTS - Pt is Jehovah's Witness  . Statins Other (See Comments)    Cause muscle aches  . Atorvastatin Other (See Comments)    Myalgias  . Crestor [Rosuvastatin] Other (See Comments)    Myalgias   . Pravastatin Other (See Comments)    Myalgias  . Simvastatin Other (See Comments)    Myalgias     Patient Measurements: Height: 6\' 1"  (185.4 cm) Weight: 211 lb 3.2 oz (95.8 kg) IBW/kg (Calculated) : 79.9 Heparin Dosing Weight: 98.4 kg  Vital Signs: Temp: 98.1 F (36.7 C) (12/01 2000) Temp Source: Oral (12/01 2000) BP: 132/66 (12/01 2000) Pulse Rate: 56 (12/01 2000)  Labs: Recent Labs    04/16/18 2137  04/17/18 0234 04/17/18 0611 04/17/18 1312 04/17/18 1942  HGB 9.4*  --  9.1*  --   --   --   HCT 31.5*  --  28.3*  --   --   --   PLT 329  --  276  --   --   --   HEPARINUNFRC  --   --   --  0.24*  --  0.28*  CREATININE 1.51*  --  1.35*  --   --   --   TROPONINI  --    < > 1.65* 1.95* 4.68* 7.39*   < > = values in this interval not displayed.    Estimated Creatinine Clearance: 45.2 mL/min (A) (by C-G formula based on SCr of 1.35 mg/dL (H)).   Medical History: Past Medical History:  Diagnosis Date  . Abdominal aortic aneurysm ([place])    a. Aortic duplex 06/2014: mild aneurysmal dilatation of proximal abdominal aorta measuring 3.4x3.4cm. No sig change from 2012. F/u due 06/2016;   . AKI (acute kidney injury) ([place]) 03/28/2016  . Arthritis    "right knee; never bothered me" (03/26/2016)  . 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 CAD in native artery    a. NSTEMI 11/2010 - CABG x2(LIMA to LAD, SVG to PDA). b. NEG Lexi MV 10/24/13, EF 53%, no perfusion abnormality, septal and apical HK noted. c. NSTEMI 05/2014 - s/p DES to SVG-RPDA 06/18/14 (Xience Alpine DES 3.0 x 18 mm -3.35 mm),  EF 60-65; d. 03/2016 STEMI/PCI: LM nl, ost LAD 70%, mLCx 50%, pRCA 95% - mRCA 60%, dRPDA 70%, LIMA->LAD ok, o-p SVG->RPDA 100% (3.0x16 Promus DES overlapping prior stent).  . Diastolic dysfunction    a. 03/2016 Echo: EF 60-65%, no rwma, Gr1 DD, triv AI, Ao root 72mm, Asc Ao 56mm, triv MR.  . Dizziness    a. Carotid duplex 03/2014: mild fibrous plaque, no significant stenosis.  . Foley catheter in place    "been wearing it for a couple months now" (03/26/2016)  . HTN (hypertension)   . Hyperlipidemia LDL goal <70   . Non-STEMI (non-ST elevated myocardial infarction) ([place]) 11/2010   1. Ostial LAD 70% (to close to [facility] for PCI), subtotal occlusion of the RCA  . Postoperative atrial fibrillation ([place]) 11/2010   Post CABG, no sign recurrence  . Refusal of blood transfusions as patient is Jehovah's Witness   . 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 Servando Snare)  . Sleep apnea    Not on CPAP. (03/26/2016)  . ST elevation myocardial infarction (STEMI) of inferior wall (  [place]) 04/05/2016   Occluded in-stent restenosis/thrombosis of SVG-RCA --> treated with overlapping Promus DES 3.0 mm 16 mm postdilated 3.0 mm).  . Stroke [facility]) 2014; 01/2015   a. 2014 with mild right hand weakness, nonhemorrhagic per pt.;; b. - PTA-Stent L ICA 95%   . Type II diabetes mellitus ([facility])       Assessment: 84 yo man to start heparin for CP. He has a history of 3 vessel CAD, and s/p CABG 2012. He was not on anticoagulation PTA.   -heparin level= 0.28 after increase to 1300 units/hr    Goal of Therapy:  Heparin level 0.3-0.7 units/ml Monitor platelets by anticoagulation protocol: Yes   Plan:   -Increase heparin to 1400 units/hr -Heparin level and CBC in am  Hildred Laser, PharmD Clinical Pharmacist **Pharmacist phone directory can now be found on [email or web address] ([email or web address]).  Listed under [place].

## 2018-04-17 NOTE — Progress Notes (Signed)
ANTICOAGULATION CONSULT NOTE - Initial Consult  Pharmacy Consult for heparin Indication: chest pain/ACS  Allergies  Allergen Reactions  . Other Other (See Comments)    No  BLOOD PRODUCTS - Pt is Jehovah's Witness  . Statins Other (See Comments)    Cause muscle aches  . Atorvastatin Other (See Comments)    Myalgias  . Crestor [Rosuvastatin] Other (See Comments)    Myalgias   . Pravastatin Other (See Comments)    Myalgias  . Simvastatin Other (See Comments)    Myalgias     Patient Measurements: Height: 6\' 1"  (185.4 cm) Weight: 211 lb 3.2 oz (95.8 kg) IBW/kg (Calculated) : 79.9 Heparin Dosing Weight: 98.4 kg  Vital Signs: Temp: 98 F (36.7 C) (12/01 0738) Temp Source: Oral (12/01 0738) BP: 147/68 (12/01 0750) Pulse Rate: 57 (12/01 0750)  Labs: Recent Labs    04/16/18 2137 04/16/18 2229 04/17/18 0234 04/17/18 0611  HGB 9.4*  --  9.1*  --   HCT 31.5*  --  28.3*  --   PLT 329  --  276  --   HEPARINUNFRC  --   --   --  0.24*  CREATININE 1.51*  --  1.35*  --   TROPONINI  --  1.07* 1.65* 1.95*    Estimated Creatinine Clearance: 45.2 mL/min (A) (by C-G formula based on SCr of 1.35 mg/dL (H)).   Medical History: Past Medical History:  Diagnosis Date  . Abdominal aortic aneurysm ([place])    a. Aortic duplex 06/2014: mild aneurysmal dilatation of proximal abdominal aorta measuring 3.4x3.4cm. No sig change from 2012. F/u due 06/2016;   . AKI (acute kidney injury) ([place]) 03/28/2016  . Arthritis    "right knee; never bothered me" (03/26/2016)  . 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 CAD in native artery    a. NSTEMI 11/2010 - CABG x2(LIMA to LAD, SVG to PDA). b. NEG Lexi MV 10/24/13, EF 53%, no perfusion abnormality, septal and apical HK noted. c. NSTEMI 05/2014 - s/p DES to SVG-RPDA 06/18/14 (Xience Alpine DES 3.0 x 18 mm -3.35 mm), EF 60-65; d. 03/2016 STEMI/PCI: LM nl, ost LAD 70%, mLCx 50%, pRCA 95% - mRCA 60%, dRPDA 70%, LIMA->LAD ok,  o-p SVG->RPDA 100% (3.0x16 Promus DES overlapping prior stent).  . Diastolic dysfunction    a. 03/2016 Echo: EF 60-65%, no rwma, Gr1 DD, triv AI, Ao root 34mm, Asc Ao 61mm, triv MR.  . Dizziness    a. Carotid duplex 03/2014: mild fibrous plaque, no significant stenosis.  . Foley catheter in place    "been wearing it for a couple months now" (03/26/2016)  . HTN (hypertension)   . Hyperlipidemia LDL goal <70   . Non-STEMI (non-ST elevated myocardial infarction) ([place]) 11/2010   1. Ostial LAD 70% (to close to [facility] for PCI), subtotal occlusion of the RCA  . Postoperative atrial fibrillation ([place]) 11/2010   Post CABG, no sign recurrence  . Refusal of blood transfusions as patient is Jehovah's Witness   . 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 Servando Snare)  . Sleep apnea    Not on CPAP. (03/26/2016)  . ST elevation myocardial infarction (STEMI) of inferior wall ([place]) 04/05/2016   Occluded in-stent restenosis/thrombosis of SVG-RCA --> treated with overlapping Promus DES 3.0 mm 16 mm postdilated 3.0 mm).  . Stroke [facility]) 2014; 01/2015   a. 2014 with mild right hand weakness, nonhemorrhagic per pt.;; b. - PTA-Stent L  ICA 95%   . Type II diabetes mellitus ([facility])     Medications:  See medication history  Assessment: 84 yo man to start heparin for CP. He has a history of 3 vessel CAD, and s/p CABG 2012. He was not on anticoagulation PTA.    Initial heparin level near therapeutic at 0.24 on heparin 1200 units/hr s/p 4000 unit bolus. Hb low but relatively stable. Blood tinged urine still noted by nurse. No other signs/symptoms of bleeding or issues with infusion reported by nursing.   Goal of Therapy:  Heparin level 0.3-0.7 units/ml Monitor platelets by anticoagulation protocol: Yes   Plan:  Increase heparin slightly to 1300 units/hr Check anti-Xa level in 8 hours and daily while on heparin Continue to monitor H&H and platelets Continue to monitor urine and other signs/symptoms of  bleeding   Thanks for allowing pharmacy to be a part of this patient's care.  Claiborne Billings, PharmD PGY2 Cardiology Pharmacy Resident Phone [phone] Please check AMION for all Pharmacist numbers by unit 04/17/2018 8:34 AM

## 2018-04-17 NOTE — Progress Notes (Signed)
Progress Note  Patient Name: [name] Date of Encounter: 04/17/2018  Primary Cardiologist: Glenetta Hew, MD   Subjective   Feeling well currently.  He did have some chest pain overnight.  He was given nitroglycerin which improved his chest pain.  Inpatient Medications    Scheduled Meds: . amLODipine  10 mg Oral QPM  . aspirin  81 mg Oral Daily  . carvedilol  12.5 mg Oral BID WC  . clopidogrel  75 mg Oral Daily  . hydrALAZINE  100 mg Oral TID  . insulin aspart  0-9 Units Subcutaneous TID WC  . isosorbide mononitrate  60 mg Oral BID  . morphine       Continuous Infusions: . heparin 1,300 Units/hr (04/17/18 0846)   PRN Meds: acetaminophen, morphine injection, oxybutynin, perflutren lipid microspheres (DEFINITY) IV suspension   Vital Signs    Vitals:   04/17/18 0342 04/17/18 0738 04/17/18 0750 04/17/18 0800  BP: (!) 129/52 (!) 143/70 (!) 147/68 (!) 150/67  Pulse:  (!) 55 (!) 57 (!) 52  Resp:  20 19 12   Temp: 98.2 F (36.8 C) 98 F (36.7 C)    TempSrc: Oral Oral    SpO2:  95% 90% 97%  Weight:      Height:        Intake/Output Summary (Last 24 hours) at 04/17/2018 0920 Last data filed at 04/17/2018 0700 Gross per 24 hour  Intake 0 ml  Output 900 ml  Net -900 ml   Filed Weights   04/16/18 2131 04/17/18 0102  Weight: 98.4 kg 95.8 kg    Telemetry    Sinus rhythm, 3 beats nonsustained VT- Personally Reviewed  ECG    Sinus rhythm, first-degree AV block, lateral T wave inversions- Personally Reviewed  Physical Exam   GEN: No acute distress.   Neck: No JVD Cardiac: RRR, no murmurs, rubs, or gallops.  Respiratory: Clear to auscultation bilaterally. GI: Soft, nontender, non-distended  MS: No edema; No deformity. Neuro:  Nonfocal  Psych: Normal affect   Labs    Chemistry Recent Labs  Lab 04/16/18 2137 04/17/18 0234  NA 138 138  K 4.2 3.6  CL 103 104  CO2 25 23  GLUCOSE 176* 135*  BUN 30* 28*  CREATININE 1.51* 1.35*  CALCIUM 9.2 8.8*    GFRNONAA 42* 48*  GFRAA 48* 55*  ANIONGAP 10 11     Hematology Recent Labs  Lab 04/16/18 2137 04/17/18 0234  WBC 12.2* 10.8*  RBC 3.51* 3.23*  HGB 9.4* 9.1*  HCT 31.5* 28.3*  MCV 89.7 87.6  MCH 26.8 28.2  MCHC 29.8* 32.2  RDW 18.6* 18.2*  PLT 329 276    Cardiac Enzymes Recent Labs  Lab 04/16/18 2229 04/17/18 0234 04/17/18 0611  TROPONINI 1.07* 1.65* 1.95*    Recent Labs  Lab 04/16/18 2156  TROPIPOC 1.10*     BNPNo results for input(s): BNP, PROBNP in the last 168 hours.   DDimer No results for input(s): DDIMER in the last 168 hours.   Radiology    Dg Chest 2 View  Result Date: 04/16/2018 CLINICAL DATA:  Initial evaluation for acute left-sided chest pain. EXAM: CHEST - 2 VIEW COMPARISON:  Prior radiograph from 01/28/2018. FINDINGS: Median sternotomy wires with underlying CABG markers noted. Stable cardiomegaly. Mediastinal silhouette unchanged, and remains within normal limits. Aortic atherosclerosis. Lungs mildly hypoinflated. Mild scattered bibasilar linear atelectatic changes with mild interstitial congestion. No frank pulmonary edema. No pleural effusion. No consolidative airspace disease. No pneumothorax. No acute  osseus abnormality. Prominent degenerative changes noted about the partially visualized shoulders. IMPRESSION: 1. Stable cardiomegaly with mild interstitial congestion without frank pulmonary edema. 2. Superimposed mild scattered bibasilar atelectasis. No other active cardiopulmonary disease. 3. Sequelae of prior CABG with aortic atherosclerosis. Electronically Signed   By: Jeannine Boga M.D.   On: 04/16/2018 22:18    Cardiac Studies   LHC 08/2017 1. Severe 3-vessel coronary artery disease, as detailed below.  Mid RCA is now occluded.  Otherwise, there has been no significant change since 03/2016. 2. Widely patent LIMA-LAD. 3. Patent SVG-rPDA. Patent SVG overlapping stents have minimal in-stent restenosis. 4. Inability to cross aortic valve  from the left radial. 5. Systemic hypertension   Patient Profile     84 y.o. male with a history of coronary artery disease status post two-vessel CABG, diastolic heart failure, frequent UTIs, hyperlipidemia, stage III CKD, urethral stricture status post suprapubic catheter, type 2 diabetes, hypertension who presented to the hospital with chest pain.  Assessment & Plan    1.  Non-ST elevation MI: Potentially due to hypertensive response after having severe abdominal pain.  His troponins certainly have been elevated, up to 1.95 today.  Okay with heparin at this point.  Continue dual antiplatelet therapy with aspirin and Plavix.  He did have recurrent chest pain overnight.  Should he have more chest pain, he may benefit from left heart catheterization.    2.  Hypertension: Pressure is been high since being admitted to the hospital.  He has had significant kidney disease.  We Belkys Henault add Cardura today.      For questions or updates, please contact [facility] Please consult [email or web address] for contact info under        Signed, Barrie Sigmund Meredith Leeds, MD  04/17/2018, 9:20 AM

## 2018-04-17 NOTE — Progress Notes (Signed)
Pt c/o chest pain p/s 1-2 , states it feels like a constant hurt ,nitroglycerine sublingually administered x2.care continues.

## 2018-04-17 NOTE — Psychosocial Assessment (Addendum)
Pt c/p of chest pain 7/10, left chest pain. Diaphoretic. BP: 171/80. 2L [place], sats 96%. EKG done. Dr. Wynelle Cleveland at the bedside. Verbal order for NitroStat, SL given Stated, she will consult cardiology.     13:30 NitroStat SL given x3. Pt stated "pain is still there but ease some", 4/10 pain. BP: 158/77. Repositioned pt to the chair. Call bell within reach. Will continue to monitor pt.

## 2018-04-17 NOTE — Progress Notes (Signed)
  Echocardiogram 2D Echocardiogram has been performed.  Bobbye Charleston 04/17/2018, 9:31 AM

## 2018-04-17 NOTE — Progress Notes (Addendum)
PROGRESS NOTE    [name]   WCB:[id number]  DOB: 04-02-1931  DOA: 04/16/2018 PCP: Suzan Garibaldi, FNP   Brief Narrative:  [name] is a 84 y.o. male with medical history significant of CAD status post CABG and PCI, chronic diastolic congestive heart failure, frequent UTIs, urethral stricture and subsequent suprapubic catheter placement, type 2 diabetes, hypertension, CVA, AAA, chronic lower extremity edema, OSA, hyperlipidemia, CKD 3 presenting to the hospital for evaluation of chest pain.  Patient reports having chest pain since 4 PM on 11/30.  The chest pain is left-sided, at rest, constant in nature, and 4-5 out of 10 in intensity.  It is not associated with nausea, shortness of breath, or diaphoresis.  States nitroglycerin he received in the ED alleviated the pain.   Patient was seen in the ED on 11/30 for evaluation of lower abdominal pain and problems with a suprapubic Foley catheter.  His suprapubic catheter was changed and there was drainage of urine.  Patient's pain had improved and he was discharged home.  Subjective: Noted to be complaining of left sided chest pain when I entered the room. He was anxious, very uncomfortable, diaphoretic and complained of feeling sweaty. His pain was not reproducible with palpation. I ordered s/l Nitroglycerin which did help some. EKG done >  did not show STEMI.    Assessment & Plan:   Principal Problem:   NSTEMI (non-ST elevated myocardial infarction) with h/o CAD/ PCI, CABG x 2  LHC 08/2017 1. Severe 3-vessel coronary artery disease, as detailed below. Mid RCA is now occluded. Otherwise, there has been no significant change since 03/2016. 2. Widely patent LIMA-LAD. 3. Patent SVG-rPDA. Patent SVG overlapping stents have minimal in-stent restenosis. 4. Inability to cross aortic valve from the left radial. 5. Systemic hypertension  - per cardiology note, will need cath if he recurrent pain - I did update Dr Curt Bears after the above  episode- Troponin continue to rise- will order 3 more sets of troponin    1.07  1.65 1.95  4.68   - ECHO shows new drop in EF to 35-40%% and akinesis of the apical myocardium.  There is severe hypokinesis of the mid-apicalanteroseptal myocardium.  - Nitro infusion started after sublingual Nitro given- PRN Morphine also ordered already - cont Coreg, Aspirin, Plavix, IV Heparin - check fasting lipids tomorrow   Active Problems: Hypertensive Urgency - contributing to above - Nitro infusion will further control BP- Imdur on hold - cont Cardura, Coreg, Amlodipine, Hydralazine  Blocked suprapubic cath- h/o urethral stricture - changed yesterday- no signs or symptoms of UTI despite + UA- no need to treat - cont Ditropain    Diabetes mellitus type 2 in obese  - A1c 8.2 - Amaryl and Metformin on hold -  cont SSI for now- may need long term insulin     CKD (chronic kidney disease) stage 3, GFR 30-59 ml/min   - stable   DVT prophylaxis: Heparin infusion Code Status: Full code Family Communication:  Disposition Plan: transfer from tele to  SDU Consultants:   cardiology Procedures:   2 D ECHO Left ventricle:  The cavity size was normal. Wall thickness was increased in a pattern of severe LVH. Systolic function was moderately reduced. The estimated ejection fraction was in the range of 35% to 40%.  Regional wall motion abnormalities:   There is akinesis of the apical myocardium.  There is severe hypokinesis of the mid-apicalanteroseptal myocardium. Features are consistent with a pseudonormal left ventricular filling pattern,  with concomitant abnormal relaxation and increased filling pressure (grade 2 diastolic dysfunction). Antimicrobials:  Anti-infectives (From admission, onward)   None       Objective: Vitals:   04/17/18 1058 04/17/18 1208 04/17/18 1250 04/17/18 1349  BP: 137/69 (!) 171/80 (!) 152/65 (!) 158/77  Pulse:  (!) 53 (!) 57 (!) 56  Resp:  18 (!) 22 18  Temp:   97.7 F (36.5 C)    TempSrc:  Oral    SpO2:  95% 97% 98%  Weight:      Height:        Intake/Output Summary (Last 24 hours) at 04/17/2018 1524 Last data filed at 04/17/2018 1455 Gross per 24 hour  Intake 120 ml  Output 1750 ml  Net -1630 ml   Filed Weights   04/16/18 2131 04/17/18 0102  Weight: 98.4 kg 95.8 kg    Examination: General exam: Appears very uncomfortable, diaphoretic, anxious HEENT: PERRLA, oral mucosa moist, no sclera icterus or thrush Respiratory system: Clear to auscultation. Respiratory effort normal. Cardiovascular system: S1 & S2 heard, RRR.  tachycardic Gastrointestinal system: Abdomen soft, non-tender, nondistended. Normal bowel sounds. Central nervous system: Alert and oriented. No focal neurological deficits. Extremities: No cyanosis, clubbing or edema Skin: No rashes or ulcers    Data Reviewed: I have personally reviewed following labs and imaging studies  CBC: Recent Labs  Lab 04/16/18 2137 04/17/18 0234  WBC 12.2* 10.8*  HGB 9.4* 9.1*  HCT 31.5* 28.3*  MCV 89.7 87.6  PLT 329 322   Basic Metabolic Panel: Recent Labs  Lab 04/16/18 2137 04/17/18 0234  NA 138 138  K 4.2 3.6  CL 103 104  CO2 25 23  GLUCOSE 176* 135*  BUN 30* 28*  CREATININE 1.51* 1.35*  CALCIUM 9.2 8.8*   GFR: Estimated Creatinine Clearance: 45.2 mL/min (A) (by C-G formula based on SCr of 1.35 mg/dL (H)). Liver Function Tests: No results for input(s): AST, ALT, ALKPHOS, BILITOT, PROT, ALBUMIN in the last 168 hours. No results for input(s): LIPASE, AMYLASE in the last 168 hours. No results for input(s): AMMONIA in the last 168 hours. Coagulation Profile: No results for input(s): INR, PROTIME in the last 168 hours. Cardiac Enzymes: Recent Labs  Lab 04/16/18 2229 04/17/18 0234 04/17/18 0611 04/17/18 1312  TROPONINI 1.07* 1.65* 1.95* 4.68*   BNP (last 3 results) No results for input(s): PROBNP in the last 8760 hours. HbA1C: Recent Labs    04/17/18 0234    HGBA1C 8.2*   CBG: Recent Labs  Lab 04/17/18 0059 04/17/18 0734 04/17/18 0749 04/17/18 1216  GLUCAP 135* 97 100* 158*   Lipid Profile: No results for input(s): CHOL, HDL, LDLCALC, TRIG, CHOLHDL, LDLDIRECT in the last 72 hours. Thyroid Function Tests: No results for input(s): TSH, T4TOTAL, FREET4, T3FREE, THYROIDAB in the last 72 hours. Anemia Panel: No results for input(s): VITAMINB12, FOLATE, FERRITIN, TIBC, IRON, RETICCTPCT in the last 72 hours. Urine analysis:    Component Value Date/Time   COLORURINE AMBER (A) 04/17/2018 0715   APPEARANCEUR CLOUDY (A) 04/17/2018 0715   LABSPEC 1.010 04/17/2018 0715   PHURINE 7.0 04/17/2018 0715   GLUCOSEU NEGATIVE 04/17/2018 0715   HGBUR LARGE (A) 04/17/2018 0715   BILIRUBINUR NEGATIVE 04/17/2018 0715   KETONESUR NEGATIVE 04/17/2018 0715   PROTEINUR 100 (A) 04/17/2018 0715   UROBILINOGEN 0.2 01/22/2015 1031   NITRITE NEGATIVE 04/17/2018 0715   LEUKOCYTESUR LARGE (A) 04/17/2018 0715   Sepsis Labs: @LABRCNTIP (procalcitonin:4,lacticidven:4) ) Recent Results (from the past 240 hour(s))  MRSA PCR Screening  Status: None   Collection Time: 04/17/18  1:00 AM  Result Value Ref Range Status   MRSA by PCR NEGATIVE NEGATIVE Final    Comment:        The GeneXpert MRSA Assay (FDA approved for NASAL specimens only), is one component of a comprehensive MRSA colonization surveillance program. It is not intended to diagnose MRSA infection nor to guide or monitor treatment for MRSA infections. Performed at [facility], [place] [address] [place], [place] [address]          Radiology Studies: Dg Chest 2 View  Result Date: 04/16/2018 CLINICAL DATA:  Initial evaluation for acute left-sided chest pain. EXAM: CHEST - 2 VIEW COMPARISON:  Prior radiograph from 01/28/2018. FINDINGS: Median sternotomy wires with underlying CABG markers noted. Stable cardiomegaly. Mediastinal silhouette unchanged, and remains within normal limits.  Aortic atherosclerosis. Lungs mildly hypoinflated. Mild scattered bibasilar linear atelectatic changes with mild interstitial congestion. No frank pulmonary edema. No pleural effusion. No consolidative airspace disease. No pneumothorax. No acute osseus abnormality. Prominent degenerative changes noted about the partially visualized shoulders. IMPRESSION: 1. Stable cardiomegaly with mild interstitial congestion without frank pulmonary edema. 2. Superimposed mild scattered bibasilar atelectasis. No other active cardiopulmonary disease. 3. Sequelae of prior CABG with aortic atherosclerosis. Electronically Signed   By: Jeannine Boga M.D.   On: 04/16/2018 22:18      Scheduled Meds: . amLODipine  10 mg Oral QPM  . aspirin  81 mg Oral Daily  . carvedilol  12.5 mg Oral BID WC  . clopidogrel  75 mg Oral Daily  . doxazosin  1 mg Oral Daily  . hydrALAZINE  100 mg Oral TID  . insulin aspart  0-9 Units Subcutaneous TID WC  . morphine       Continuous Infusions: . heparin 1,300 Units/hr (04/17/18 0846)  . nitroGLYCERIN       LOS: 0 days    Time spent in minutes: [place], MD [facility] Pager: [email or web address] Password [email or web address] 04/17/2018, 3:24 PM

## 2018-04-17 NOTE — Progress Notes (Signed)
Dr Wynelle Cleveland informed via [place] text re  C/o chest pain,Telephone order given for  stat Morphine 2mg  iv for chest pain.

## 2018-04-18 ENCOUNTER — Inpatient Hospital Stay (HOSPITAL_COMMUNITY)
Admission: EM | Disposition: A | Discharge status: Home / Self Care | Payer: Self-pay | Source: Home / Self Care | Attending: Internal Medicine

## 2018-04-18 DIAGNOSIS — I5042 Chronic combined systolic (congestive) and diastolic (congestive) heart failure: Secondary | ICD-10-CM | POA: Diagnosis present

## 2018-04-18 DIAGNOSIS — I25709 Atherosclerosis of coronary artery bypass graft(s), unspecified, with unspecified angina pectoris: Secondary | ICD-10-CM

## 2018-04-18 HISTORY — PX: CORONARY BALLOON ANGIOPLASTY: CATH118233

## 2018-04-18 HISTORY — PX: LEFT HEART CATH AND CORONARY ANGIOGRAPHY: CATH118249

## 2018-04-18 LAB — CBC
HCT: 27.7 % — ABNORMAL LOW (ref 39.0–52.0)
Hemoglobin: 8.9 g/dL — ABNORMAL LOW (ref 13.0–17.0)
MCH: 27.9 pg (ref 26.0–34.0)
MCHC: 32.1 g/dL (ref 30.0–36.0)
MCV: 86.8 fL (ref 80.0–100.0)
Platelets: 267 10*3/uL (ref 150–400)
RBC: 3.19 MIL/uL — ABNORMAL LOW (ref 4.22–5.81)
RDW: 18.1 % — ABNORMAL HIGH (ref 11.5–15.5)
WBC: 9.1 10*3/uL (ref 4.0–10.5)
nRBC: 0 % (ref 0.0–0.2)

## 2018-04-18 LAB — BASIC METABOLIC PANEL WITH GFR
Anion gap: 8 (ref 5–15)
BUN: 27 mg/dL — ABNORMAL HIGH (ref 8–23)
CO2: 24 mmol/L (ref 22–32)
Calcium: 8.4 mg/dL — ABNORMAL LOW (ref 8.9–10.3)
Chloride: 101 mmol/L (ref 98–111)
Creatinine, Ser: 1.37 mg/dL — ABNORMAL HIGH (ref 0.61–1.24)
GFR calc Af Amer: 54 mL/min — ABNORMAL LOW
GFR calc non Af Amer: 47 mL/min — ABNORMAL LOW
Glucose, Bld: 141 mg/dL — ABNORMAL HIGH (ref 70–99)
Potassium: 3.9 mmol/L (ref 3.5–5.1)
Sodium: 133 mmol/L — ABNORMAL LOW (ref 135–145)

## 2018-04-18 LAB — TROPONIN I
Troponin I: 7.18 ng/mL
Troponin I: 6.34 ng/mL

## 2018-04-18 LAB — LIPID PANEL
Cholesterol: 141 mg/dL (ref 0–200)
HDL: 40 mg/dL — ABNORMAL LOW
LDL Cholesterol: 86 mg/dL (ref 0–99)
Total CHOL/HDL Ratio: 3.5 ratio
Triglycerides: 75 mg/dL
VLDL: 15 mg/dL (ref 0–40)

## 2018-04-18 LAB — POCT ACTIVATED CLOTTING TIME
Activated Clotting Time: 202 seconds
Activated Clotting Time: 224 seconds
Activated Clotting Time: 241 seconds

## 2018-04-18 LAB — GLUCOSE, CAPILLARY
Glucose-Capillary: 104 mg/dL — ABNORMAL HIGH (ref 70–99)
Glucose-Capillary: 219 mg/dL — ABNORMAL HIGH (ref 70–99)
Glucose-Capillary: 233 mg/dL — ABNORMAL HIGH (ref 70–99)

## 2018-04-18 LAB — HEPARIN LEVEL (UNFRACTIONATED)
Heparin Unfractionated: 0.27 [IU]/mL — ABNORMAL LOW (ref 0.30–0.70)
Heparin Unfractionated: 0.32 IU/mL (ref 0.30–0.70)

## 2018-04-18 SURGERY — LEFT HEART CATH AND CORONARY ANGIOGRAPHY
Anesthesia: LOCAL

## 2018-04-18 MED ORDER — SODIUM CHLORIDE 0.9% FLUSH: 3.0000 mL | INTRAVENOUS | Status: DC | PRN

## 2018-04-18 MED ORDER — FENTANYL CITRATE (PF) 100 MCG/2ML IJ SOLN
INTRAMUSCULAR | Status: DC | PRN
  Administered 2018-04-18: 25 ug via INTRAVENOUS

## 2018-04-18 MED ORDER — MIDAZOLAM HCL 2 MG/2ML IJ SOLN
INTRAMUSCULAR | Status: DC | PRN
  Administered 2018-04-18: 1 mg via INTRAVENOUS

## 2018-04-18 MED ORDER — FENTANYL CITRATE (PF) 100 MCG/2ML IJ SOLN
INTRAMUSCULAR | Status: AC
  Filled 2018-04-18: qty 2

## 2018-04-18 MED ORDER — HEPARIN SODIUM (PORCINE) 1000 UNIT/ML IJ SOLN
INTRAMUSCULAR | Status: DC | PRN
  Administered 2018-04-18: 4000 [IU] via INTRAVENOUS
  Administered 2018-04-18: 5000 [IU] via INTRAVENOUS
  Administered 2018-04-18: 3000 [IU] via INTRAVENOUS
  Administered 2018-04-18: 5000 [IU] via INTRAVENOUS
  Administered 2018-04-18: 4000 [IU] via INTRAVENOUS

## 2018-04-18 MED ORDER — VERAPAMIL HCL 2.5 MG/ML IV SOLN
INTRAVENOUS | Status: DC | PRN
  Administered 2018-04-18: 10 mL via INTRA_ARTERIAL

## 2018-04-18 MED ORDER — HEPARIN (PORCINE) IN NACL 1000-0.9 UT/500ML-% IV SOLN
INTRAVENOUS | Status: AC
  Filled 2018-04-18: qty 1000

## 2018-04-18 MED ORDER — MIDAZOLAM HCL 2 MG/2ML IJ SOLN
INTRAMUSCULAR | Status: AC
  Filled 2018-04-18: qty 2

## 2018-04-18 MED ORDER — SODIUM CHLORIDE 0.9 % WEIGHT BASED INFUSION
1.0000 mL/kg/h | INTRAVENOUS | Status: DC
  Administered 2018-04-18: 1 mL/kg/h via INTRAVENOUS

## 2018-04-18 MED ORDER — SODIUM CHLORIDE 0.9% FLUSH
3.0000 mL | INTRAVENOUS | Status: DC | PRN
  Administered 2018-04-18: 3 mL via INTRAVENOUS
  Filled 2018-04-18: qty 3

## 2018-04-18 MED ORDER — SODIUM CHLORIDE 0.9% FLUSH
3.0000 mL | Freq: Two times a day (BID) | INTRAVENOUS | Status: DC
  Administered 2018-04-18 – 2018-04-21 (×6): 3 mL via INTRAVENOUS

## 2018-04-18 MED ORDER — SODIUM CHLORIDE 0.9 % IV SOLN: 250.0000 mL | INTRAVENOUS | Status: DC | PRN

## 2018-04-18 MED ORDER — ASPIRIN 81 MG PO CHEW
81.0000 mg | CHEWABLE_TABLET | ORAL | Status: DC
  Filled 2018-04-18: qty 1

## 2018-04-18 MED ORDER — HEPARIN (PORCINE) IN NACL 1000-0.9 UT/500ML-% IV SOLN
INTRAVENOUS | Status: DC | PRN
  Administered 2018-04-18 (×2): 500 mL

## 2018-04-18 MED ORDER — HEPARIN SODIUM (PORCINE) 1000 UNIT/ML IJ SOLN
INTRAMUSCULAR | Status: AC
  Filled 2018-04-18: qty 1

## 2018-04-18 MED ORDER — FUROSEMIDE 10 MG/ML IJ SOLN
40.0000 mg | Freq: Two times a day (BID) | INTRAMUSCULAR | Status: AC
  Administered 2018-04-18 – 2018-04-19 (×2): 40 mg via INTRAVENOUS
  Filled 2018-04-18 (×2): qty 4

## 2018-04-18 MED ORDER — HYDRALAZINE HCL 20 MG/ML IJ SOLN: 5.0000 mg | INTRAMUSCULAR | Status: DC | PRN

## 2018-04-18 MED ORDER — NITROGLYCERIN 1 MG/10 ML FOR IR/CATH LAB
INTRA_ARTERIAL | Status: AC
  Filled 2018-04-18: qty 10

## 2018-04-18 MED ORDER — LABETALOL HCL 5 MG/ML IV SOLN: 10.0000 mg | INTRAVENOUS | Status: DC | PRN

## 2018-04-18 MED ORDER — SODIUM CHLORIDE 0.9 % IV SOLN
INTRAVENOUS | Status: DC
  Administered 2018-04-18: 1000 mL via INTRAVENOUS
  Administered 2018-04-18: 19:00:00 via INTRAVENOUS

## 2018-04-18 MED ORDER — SODIUM CHLORIDE 0.9% FLUSH
3.0000 mL | Freq: Two times a day (BID) | INTRAVENOUS | Status: DC
  Administered 2018-04-18: 3 mL via INTRAVENOUS

## 2018-04-18 MED ORDER — VERAPAMIL HCL 2.5 MG/ML IV SOLN
INTRAVENOUS | Status: AC
  Filled 2018-04-18: qty 2

## 2018-04-18 MED ORDER — LIDOCAINE HCL (PF) 1 % IJ SOLN
INTRAMUSCULAR | Status: AC
  Filled 2018-04-18: qty 30

## 2018-04-18 MED ORDER — ONDANSETRON HCL 4 MG/2ML IJ SOLN: 4.0000 mg | Freq: Four times a day (QID) | INTRAMUSCULAR | Status: DC | PRN

## 2018-04-18 MED ORDER — LIDOCAINE HCL (PF) 1 % IJ SOLN
INTRAMUSCULAR | Status: DC | PRN
  Administered 2018-04-18: 2 mL

## 2018-04-18 MED ORDER — IOHEXOL 350 MG/ML SOLN
INTRAVENOUS | Status: DC | PRN
  Administered 2018-04-18: 185 mL via INTRA_ARTERIAL

## 2018-04-18 MED ORDER — SODIUM CHLORIDE 0.9 % WEIGHT BASED INFUSION
3.0000 mL/kg/h | INTRAVENOUS | Status: DC
  Administered 2018-04-18: 3 mL/kg/h via INTRAVENOUS

## 2018-04-18 SURGICAL SUPPLY — 17 items
BALLN SAPPHIRE ~~LOC~~ 3.25X12 (BALLOONS) ×2 IMPLANT
CATH EXPO 5F MPA-1 (CATHETERS) ×2 IMPLANT
CATH INFINITI 5FR MULTPACK ANG (CATHETERS) ×2 IMPLANT
CATH LAUNCHER 6FR EBU 4 (CATHETERS) ×2 IMPLANT
CATH VISTA GUIDE 6FR AL1 (CATHETERS) ×2 IMPLANT
CATH VISTA GUIDE 6FR MPA1 (CATHETERS) ×2 IMPLANT
DEVICE RAD COMP TR BAND LRG (VASCULAR PRODUCTS) ×2 IMPLANT
GLIDESHEATH SLEND SS 6F .021 (SHEATH) ×2 IMPLANT
GUIDEWIRE INQWIRE 1.5J.035X260 (WIRE) ×1 IMPLANT
INQWIRE 1.5J .035X260CM (WIRE) ×2
KIT ENCORE 26 ADVANTAGE (KITS) ×2 IMPLANT
KIT HEART LEFT (KITS) ×2 IMPLANT
PACK CARDIAC CATHETERIZATION (CUSTOM PROCEDURE TRAY) ×2 IMPLANT
SHEATH PROBE COVER 6X72 (BAG) ×2 IMPLANT
TRANSDUCER W/STOPCOCK (MISCELLANEOUS) ×2 IMPLANT
TUBING CIL FLEX 10 FLL-RA (TUBING) ×2 IMPLANT
WIRE ASAHI PROWATER 180CM (WIRE) ×2 IMPLANT

## 2018-04-18 NOTE — Progress Notes (Signed)
Pt returned form cardiac cath , alert and oriented , in bed resting , encouraged to elevate left arm.

## 2018-04-18 NOTE — Progress Notes (Addendum)
Md notified for clarification for EKGs.  How often.  Current order is every 4 hours.  Pt chest pain free, on heparin gtt, no change on previous EKG.  Will continue to monitor [place] [place] T

## 2018-04-18 NOTE — Progress Notes (Signed)
Progress Note  Patient Name: [name] Date of Encounter: 04/18/2018  Primary Cardiologist: Glenetta Hew, MD   Subjective   No chest pain overnight. Continues on nitro drip.  Inpatient Medications    Scheduled Meds: . amLODipine  10 mg Oral QPM  . aspirin  81 mg Oral Daily  . carvedilol  12.5 mg Oral BID WC  . clopidogrel  75 mg Oral Daily  . doxazosin  1 mg Oral Daily  . hydrALAZINE  100 mg Oral TID  . insulin aspart  0-9 Units Subcutaneous TID WC   Continuous Infusions: . heparin 1,500 Units/hr (04/18/18 0357)  . nitroGLYCERIN 5 mcg/min (04/18/18 0357)   PRN Meds: acetaminophen, morphine injection, nitroGLYCERIN, oxybutynin   Vital Signs    Vitals:   04/18/18 0357 04/18/18 0400 04/18/18 0500 04/18/18 0600  BP:  129/65 128/61 (!) 120/54  Pulse: (!) 57 (!) 56 (!) 55 (!) 55  Resp: 18 20 15 18   Temp: 98.4 F (36.9 C)     TempSrc: Oral     SpO2: 96% 96% 97% 95%  Weight:      Height:        Intake/Output Summary (Last 24 hours) at 04/18/2018 0814 Last data filed at 04/18/2018 0600 Gross per 24 hour  Intake 766.59 ml  Output 1650 ml  Net -883.41 ml   Filed Weights   04/16/18 2131 04/17/18 0102  Weight: 98.4 kg 95.8 kg    Telemetry    Normal rhythm, one episode of 5 beats of NSVT - Personally Reviewed  ECG  04/18/18 Sinus bradycardia with 1st degree A-V block Low voltage QRS Inferior infarct , age undetermined T wave abnormality, consider anterolateral ischemia - Personally Reviewed  Physical Exam   GEN: No acute distress.   Neck: No JVD Cardiac: RRR, no murmurs, rubs, or gallops.  Respiratory: Mild bibasilar crackles. GI: Soft, nontender, non-distended  GU: suprapubic catheter in place, Foley bag contains red tinged fluid with small clot. MS: No edema; No deformity. Neuro:  Nonfocal  Psych: Normal affect   Labs    Chemistry Recent Labs  Lab 04/16/18 2137 04/17/18 0234 04/18/18 0136  NA 138 138 133*  K 4.2 3.6 3.9  CL 103 104  101  CO2 25 23 24   GLUCOSE 176* 135* 141*  BUN 30* 28* 27*  CREATININE 1.51* 1.35* 1.37*  CALCIUM 9.2 8.8* 8.4*  GFRNONAA 42* 48* 47*  GFRAA 48* 55* 54*  ANIONGAP 10 11 8      Hematology Recent Labs  Lab 04/16/18 2137 04/17/18 0234 04/18/18 0136  WBC 12.2* 10.8* 9.1  RBC 3.51* 3.23* 3.19*  HGB 9.4* 9.1* 8.9*  HCT 31.5* 28.3* 27.7*  MCV 89.7 87.6 86.8  MCH 26.8 28.2 27.9  MCHC 29.8* 32.2 32.1  RDW 18.6* 18.2* 18.1*  PLT 329 276 267    Cardiac Enzymes Recent Labs  Lab 04/17/18 1312 04/17/18 1942 04/18/18 0136 04/18/18 0645  TROPONINI 4.68* 7.39* 7.18* 6.34*    Recent Labs  Lab 04/16/18 2156  TROPIPOC 1.10*     BNPNo results for input(s): BNP, PROBNP in the last 168 hours.   DDimer No results for input(s): DDIMER in the last 168 hours.   Radiology    Dg Chest 2 View  Result Date: 04/16/2018 CLINICAL DATA:  Initial evaluation for acute left-sided chest pain. EXAM: CHEST - 2 VIEW COMPARISON:  Prior radiograph from 01/28/2018. FINDINGS: Median sternotomy wires with underlying CABG markers noted. Stable cardiomegaly. Mediastinal silhouette unchanged, and remains within normal  limits. Aortic atherosclerosis. Lungs mildly hypoinflated. Mild scattered bibasilar linear atelectatic changes with mild interstitial congestion. No frank pulmonary edema. No pleural effusion. No consolidative airspace disease. No pneumothorax. No acute osseus abnormality. Prominent degenerative changes noted about the partially visualized shoulders. IMPRESSION: 1. Stable cardiomegaly with mild interstitial congestion without frank pulmonary edema. 2. Superimposed mild scattered bibasilar atelectasis. No other active cardiopulmonary disease. 3. Sequelae of prior CABG with aortic atherosclerosis. Electronically Signed   By: Jeannine Boga M.D.   On: 04/16/2018 22:18    Cardiac Studies   Echo 04/17/18 Study Conclusions  - Left ventricle: The cavity size was normal. Wall thickness was    increased in a pattern of severe LVH. Systolic function was   moderately reduced. The estimated ejection fraction was in the   range of 35% to 40%. There is akinesis of the apical myocardium.   There is severe hypokinesis of the mid-apicalanteroseptal   myocardium. Features are consistent with a pseudonormal left   ventricular filling pattern, with concomitant abnormal relaxation   and increased filling pressure (grade 2 diastolic dysfunction).   Prominent muscle stranding at LV apex, but no thrombus based on   Definity contrast imaging. - Aortic valve: Mildly to moderately calcified annulus. Trileaflet;   moderately calcified leaflets. There was mild regurgitation. Mean   gradient (S): 7 mm Hg. Peak gradient (S): 24 mm Hg. VTI ratio of   LVOT to aortic valve: 0.55. Valve area (VTI): 2.49 cm^2. Valve   area (Vmax): 1.86 cm^2. Valve area (Vmean): 2.57 cm^2. - Aorta: Aortic root dimension: 43 mm (ED). - Aortic root: The aortic root was mildly dilated. - Mitral valve: Mildly calcified annulus. There was trivial   regurgitation. - Left atrium: The atrium was mildly dilated. - Right atrium: Central venous pressure (est): 8 mm Hg. - Atrial septum: No defect or patent foramen ovale was identified. - Tricuspid valve: There was mild regurgitation. - Pulmonary arteries: PA peak pressure: 33 mm Hg (S). - Pericardium, extracardiac: There was no pericardial effusion.   Patient Profile     84 y.o. male with a history of coronary artery disease status post two-vessel CABG, diastolic heart failure, frequent UTIs, hyperlipidemia, stage III CKD, urethral stricture status post suprapubic catheter, type 2 diabetes, hypertension who presented to the hospital with chest pain. Newly reduced ejection fraction on echo as compared to April 2019.   Assessment & Plan    Principal Problem:   NSTEMI (non-ST elevated myocardial infarction) ([place]) Active Problems:   Diabetes mellitus type 2 in obese ([place])    Stroke [facility])   Essential hypertension   CKD (chronic kidney disease) stage 3, GFR 30-59 ml/min (HCC)   Leukocytosis   Chronic diastolic CHF (congestive heart failure) (HCC)   History of suprapubic catheter   Chronic anemia  1. Nonstemi - his ejection fraction has dropped since April. He has had chest pain this admission. Although he had an angiogram in April, it would be best to ensure his troponin elevation and his drop in EF are not related to new flow limiting lesions.  He is anemic, there is red tinged fluid in his Foley bag but he has not had frank brb from the suprapubic catheter. No other significant bleeding endorsed.   Trop peaked at 7.39. ECG changes with t wave inversions concerning for ischemia.  2. HTN - better controlled today, will continue current regimen through coronary angiography and determine best homegoing regimen after ischemia eval.   Remainder per IM.   INFORMED CONSENT:  I have reviewed the risks, indications, and alternatives to cardiac catheterization, possible angioplasty, and stenting with the patient. Risks include but are not limited to bleeding, infection, vascular injury, stroke, myocardial infection, arrhythmia, kidney injury, radiation-related injury in the case of prolonged fluoroscopy use, emergency cardiac surgery, and death. The patient understands the risks of serious complication is 1-2 in 3474 with diagnostic cardiac cath and 1-2% or less with angioplasty/stenting.      For questions or updates, please contact [place] Please consult [email or web address] for contact info under        Signed, Elouise Munroe, MD  04/18/2018, 8:14 AM

## 2018-04-18 NOTE — Progress Notes (Signed)
Pt left unit on bed accompanied by staff to the cath lab.

## 2018-04-18 NOTE — Progress Notes (Signed)
TEAM 1 - Stepdown/ICU TEAM  [name]  WPY:[id number] DOB: [birth year] DOA: 04/16/2018 PCP: Suzan Garibaldi, FNP    Brief Narrative:  84 y.o. male with a hx of CAD status post CABG and PCI, chronic diastolic congestive heart failure, frequent UTIs, urethral stricture > subsequent suprapubic catheter placement, DM2, HTN, CVA, AAA, chronic lower extremity edema, OSA, hyperlipidemia, and CKD 3 who presented w/ one day of chest pain.    In the ED his first troponin was 0.10, and his EKG was without acute abnormality.   Significant Events: 11/30 admit  12/1 TTE  Subjective: The patient is sitting up on the side of his bed.  He reports that his chest pain has resolved.  He denies shortness of breath nausea vomiting or abdominal pain.  Overall he feels much better.  He confirms that his suprapubic catheter continues to drain without difficulty.  Assessment & Plan:  NSTEMI Troponin has climbed since admit - Cardiology following - anticipate cardiac cath   Recent Labs  Lab 04/17/18 0611 04/17/18 1312 04/17/18 1942 04/18/18 0136 04/18/18 0645  TROPONINI 1.95* 4.68* 7.39* 7.18* 6.34*    HTN w/ urgency BP variable but improved overall  Blocked suprapubic cath Changed during different visit to ED 11/30 - now draining w/o difficulty  DM2 CBG well controlled  CKD Stage 3 crt is stable   Recent Labs  Lab 04/16/18 2137 04/17/18 0234 04/18/18 0136  CREATININE 1.51* 1.35* 1.37*     DVT prophylaxis: IV heparin  Code Status: FULL CODE Family Communication: no family present at time of exam  Disposition Plan: SDU - awaiting cath   Consultants:  [name]   Antimicrobials:  none  Objective: Blood pressure (!) 120/54, pulse (!) 55, temperature 98.4 F (36.9 C), temperature source Oral, resp. rate 18, height 6\' 1"  (1.854 m), weight 95.8 kg, SpO2 95 %.  Intake/Output Summary (Last 24 hours) at 04/18/2018 0847 Last data filed at 04/18/2018 0600 Gross per  24 hour  Intake 766.59 ml  Output 1650 ml  Net -883.41 ml   Filed Weights   04/16/18 2131 04/17/18 0102  Weight: 98.4 kg 95.8 kg    Examination: General: No acute respiratory distress Lungs: Clear to auscultation bilaterally without wheezes or crackles Cardiovascular: Regular rate and rhythm without murmur gallop or rub normal S1 and S2 Abdomen: Nontender, nondistended, soft, bowel sounds positive, no rebound, no ascites, no appreciable mass Extremities: No significant cyanosis, clubbing, or edema bilateral lower extremities  CBC: Recent Labs  Lab 04/16/18 2137 04/17/18 0234 04/18/18 0136  WBC 12.2* 10.8* 9.1  HGB 9.4* 9.1* 8.9*  HCT 31.5* 28.3* 27.7*  MCV 89.7 87.6 86.8  PLT 329 276 053   Basic Metabolic Panel: Recent Labs  Lab 04/16/18 2137 04/17/18 0234 04/18/18 0136  NA 138 138 133*  K 4.2 3.6 3.9  CL 103 104 101  CO2 25 23 24   GLUCOSE 176* 135* 141*  BUN 30* 28* 27*  CREATININE 1.51* 1.35* 1.37*  CALCIUM 9.2 8.8* 8.4*   GFR: Estimated Creatinine Clearance: 44.6 mL/min (A) (by C-G formula based on SCr of 1.37 mg/dL (H)).  Liver Function Tests: No results for input(s): AST, ALT, ALKPHOS, BILITOT, PROT, ALBUMIN in the last 168 hours. No results for input(s): LIPASE, AMYLASE in the last 168 hours. No results for input(s): AMMONIA in the last 168 hours.   Cardiac Enzymes: Recent Labs  Lab 04/17/18 0611 04/17/18 1312 04/17/18 1942 04/18/18 0136 04/18/18 0645  TROPONINI 1.95* 4.68* 7.39* 7.18*  6.34*    HbA1C: Hgb A1c MFr Bld  Date/Time Value Ref Range Status  04/17/2018 02:34 AM 8.2 (H) 4.8 - 5.6 % Final    Comment:    (NOTE) Pre diabetes:          5.7%-6.4% Diabetes:              >6.4% Glycemic control for   <7.0% adults with diabetes   03/26/2016 11:17 AM 7.1 (H) 4.8 - 5.6 % Final    Comment:    (NOTE)         Pre-diabetes: 5.7 - 6.4         Diabetes: >6.4         Glycemic control for adults with diabetes: <7.0     CBG: Recent Labs    Lab 04/17/18 0749 04/17/18 1216 04/17/18 1725 04/17/18 2209 04/18/18 0756  GLUCAP 100* 158* 238* 196* 104*    Recent Results (from the past 240 hour(s))  MRSA PCR Screening     Status: None   Collection Time: 04/17/18  1:00 AM  Result Value Ref Range Status   MRSA by PCR NEGATIVE NEGATIVE Final    Comment:        The GeneXpert MRSA Assay (FDA approved for NASAL specimens only), is one component of a comprehensive MRSA colonization surveillance program. It is not intended to diagnose MRSA infection nor to guide or monitor treatment for MRSA infections. Performed at [facility], [place] [address], [place], [place] [address]      Scheduled Meds: . amLODipine  10 mg Oral QPM  . aspirin  81 mg Oral Daily  . carvedilol  12.5 mg Oral BID WC  . clopidogrel  75 mg Oral Daily  . doxazosin  1 mg Oral Daily  . hydrALAZINE  100 mg Oral TID  . insulin aspart  0-9 Units Subcutaneous TID WC   Continuous Infusions: . heparin 1,500 Units/hr (04/18/18 0357)  . nitroGLYCERIN 5 mcg/min (04/18/18 0357)     LOS: 1 day   Cherene Altes, MD [facility] Office  [phone] Pager - Text Page per Amion  If 7PM-7AM, please contact night-coverage per Amion 04/18/2018, 8:47 AM

## 2018-04-18 NOTE — Progress Notes (Signed)
ANTICOAGULATION CONSULT NOTE - Follow Up Consult  Pharmacy Consult for heparin Indication: NSTEMI  Labs: Recent Labs    04/16/18 2137  04/17/18 0234 04/17/18 0611 04/17/18 1312 04/17/18 1942 04/18/18 0136  HGB 9.4*  --  9.1*  --   --   --  8.9*  HCT 31.5*  --  28.3*  --   --   --  27.7*  PLT 329  --  276  --   --   --  267  HEPARINUNFRC  --   --   --  0.24*  --  0.28* 0.27*  CREATININE 1.51*  --  1.35*  --   --   --  1.37*  TROPONINI  --    < > 1.65* 1.95* 4.68* 7.39*  --    < > = values in this interval not displayed.    Assessment: 84yo male remains subtherapeutic on heparin with lower heparin level despite rate increase; RN reports no gtt issues though she does report red urine from catheter as opposed to more pink color earlier; Hgb slowly trending down.  Goal of Therapy:  Heparin level 0.3-0.7 units/ml   Plan:  Will cautiously increase heparin gtt by 1 unit/kg/hr to 1500 units/hr and check level in 8 hours.    Wynona Neat, PharmD, BCPS  04/18/2018,3:47 AM

## 2018-04-18 NOTE — Interval H&P Note (Signed)
History and Physical Interval Note:  04/18/2018 3:53 PM  [name]  has presented today for surgery, with the diagnosis of NSTEMI.  The various methods of treatment have been discussed with the patient and family. After consideration of risks, benefits and other options for treatment, the patient has consented to  Procedure(s): LEFT HEART CATH AND CORONARY ANGIOGRAPHY (N/A) with possible PERCUTANEOUS CORONARY INTERVENTION as a surgical intervention .  The patient's history has been reviewed, patient examined, no change in status, stable for surgery.  I have reviewed the patient's chart and labs.  Questions were answered to the patient's satisfaction.     Cath Lab Visit (complete for each Cath Lab visit)  Clinical Evaluation Leading to the Procedure:   ACS: Yes.    Non-ACS:    Anginal Classification: CCS IV  Anti-ischemic medical therapy: Maximal Therapy (2 or more classes of medications)  Non-Invasive Test Results: No non-invasive testing performed  Prior CABG: Previous CABG  Glenetta Hew

## 2018-04-18 NOTE — Progress Notes (Signed)
ANTICOAGULATION CONSULT NOTE - Follow Up Consult  Pharmacy Consult for heparin Indication: NSTEMI  Labs: Recent Labs    04/16/18 2137  04/17/18 0234  04/17/18 1942 04/18/18 0136 04/18/18 0645 04/18/18 1131  HGB 9.4*  --  9.1*  --   --  8.9*  --   --   HCT 31.5*  --  28.3*  --   --  27.7*  --   --   PLT 329  --  276  --   --  267  --   --   HEPARINUNFRC  --   --   --    < > 0.28* 0.27*  --  0.32  CREATININE 1.51*  --  1.35*  --   --  1.37*  --   --   TROPONINI  --    < > 1.65*   < > 7.39* 7.18* 6.34*  --    < > = values in this interval not displayed.    Assessment: 84yo male now at goal on heparin after rate increase; hgb low but stable at 8.9.   Will follow up plans for cath.   Goal of Therapy:  Heparin level 0.3-0.7 units/ml   Plan:  Continue heparin at 1500 units/hr Follow up cath plans  Erin Hearing PharmD., BCPS Clinical Pharmacist 04/18/2018 1:04 PM

## 2018-04-18 NOTE — H&P (View-Only) (Signed)
Progress Note  Patient Name: [name] Date of Encounter: 04/18/2018  Primary Cardiologist: Glenetta Hew, MD   Subjective   No chest pain overnight. Continues on nitro drip.  Inpatient Medications    Scheduled Meds: . amLODipine  10 mg Oral QPM  . aspirin  81 mg Oral Daily  . carvedilol  12.5 mg Oral BID WC  . clopidogrel  75 mg Oral Daily  . doxazosin  1 mg Oral Daily  . hydrALAZINE  100 mg Oral TID  . insulin aspart  0-9 Units Subcutaneous TID WC   Continuous Infusions: . heparin 1,500 Units/hr (04/18/18 0357)  . nitroGLYCERIN 5 mcg/min (04/18/18 0357)   PRN Meds: acetaminophen, morphine injection, nitroGLYCERIN, oxybutynin   Vital Signs    Vitals:   04/18/18 0357 04/18/18 0400 04/18/18 0500 04/18/18 0600  BP:  129/65 128/61 (!) 120/54  Pulse: (!) 57 (!) 56 (!) 55 (!) 55  Resp: 18 20 15 18   Temp: 98.4 F (36.9 C)     TempSrc: Oral     SpO2: 96% 96% 97% 95%  Weight:      Height:        Intake/Output Summary (Last 24 hours) at 04/18/2018 0814 Last data filed at 04/18/2018 0600 Gross per 24 hour  Intake 766.59 ml  Output 1650 ml  Net -883.41 ml   Filed Weights   04/16/18 2131 04/17/18 0102  Weight: 98.4 kg 95.8 kg    Telemetry    Normal rhythm, one episode of 5 beats of NSVT - Personally Reviewed  ECG  04/18/18 Sinus bradycardia with 1st degree A-V block Low voltage QRS Inferior infarct , age undetermined T wave abnormality, consider anterolateral ischemia - Personally Reviewed  Physical Exam   GEN: No acute distress.   Neck: No JVD Cardiac: RRR, no murmurs, rubs, or gallops.  Respiratory: Mild bibasilar crackles. GI: Soft, nontender, non-distended  GU: suprapubic catheter in place, Foley bag contains red tinged fluid with small clot. MS: No edema; No deformity. Neuro:  Nonfocal  Psych: Normal affect   Labs    Chemistry Recent Labs  Lab 04/16/18 2137 04/17/18 0234 04/18/18 0136  NA 138 138 133*  K 4.2 3.6 3.9  CL 103 104  101  CO2 25 23 24   GLUCOSE 176* 135* 141*  BUN 30* 28* 27*  CREATININE 1.51* 1.35* 1.37*  CALCIUM 9.2 8.8* 8.4*  GFRNONAA 42* 48* 47*  GFRAA 48* 55* 54*  ANIONGAP 10 11 8      Hematology Recent Labs  Lab 04/16/18 2137 04/17/18 0234 04/18/18 0136  WBC 12.2* 10.8* 9.1  RBC 3.51* 3.23* 3.19*  HGB 9.4* 9.1* 8.9*  HCT 31.5* 28.3* 27.7*  MCV 89.7 87.6 86.8  MCH 26.8 28.2 27.9  MCHC 29.8* 32.2 32.1  RDW 18.6* 18.2* 18.1*  PLT 329 276 267    Cardiac Enzymes Recent Labs  Lab 04/17/18 1312 04/17/18 1942 04/18/18 0136 04/18/18 0645  TROPONINI 4.68* 7.39* 7.18* 6.34*    Recent Labs  Lab 04/16/18 2156  TROPIPOC 1.10*     BNPNo results for input(s): BNP, PROBNP in the last 168 hours.   DDimer No results for input(s): DDIMER in the last 168 hours.   Radiology    Dg Chest 2 View  Result Date: 04/16/2018 CLINICAL DATA:  Initial evaluation for acute left-sided chest pain. EXAM: CHEST - 2 VIEW COMPARISON:  Prior radiograph from 01/28/2018. FINDINGS: Median sternotomy wires with underlying CABG markers noted. Stable cardiomegaly. Mediastinal silhouette unchanged, and remains within normal  limits. Aortic atherosclerosis. Lungs mildly hypoinflated. Mild scattered bibasilar linear atelectatic changes with mild interstitial congestion. No frank pulmonary edema. No pleural effusion. No consolidative airspace disease. No pneumothorax. No acute osseus abnormality. Prominent degenerative changes noted about the partially visualized shoulders. IMPRESSION: 1. Stable cardiomegaly with mild interstitial congestion without frank pulmonary edema. 2. Superimposed mild scattered bibasilar atelectasis. No other active cardiopulmonary disease. 3. Sequelae of prior CABG with aortic atherosclerosis. Electronically Signed   By: Jeannine Boga M.D.   On: 04/16/2018 22:18    Cardiac Studies   Echo 04/17/18 Study Conclusions  - Left ventricle: The cavity size was normal. Wall thickness was    increased in a pattern of severe LVH. Systolic function was   moderately reduced. The estimated ejection fraction was in the   range of 35% to 40%. There is akinesis of the apical myocardium.   There is severe hypokinesis of the mid-apicalanteroseptal   myocardium. Features are consistent with a pseudonormal left   ventricular filling pattern, with concomitant abnormal relaxation   and increased filling pressure (grade 2 diastolic dysfunction).   Prominent muscle stranding at LV apex, but no thrombus based on   Definity contrast imaging. - Aortic valve: Mildly to moderately calcified annulus. Trileaflet;   moderately calcified leaflets. There was mild regurgitation. Mean   gradient (S): 7 mm Hg. Peak gradient (S): 24 mm Hg. VTI ratio of   LVOT to aortic valve: 0.55. Valve area (VTI): 2.49 cm^2. Valve   area (Vmax): 1.86 cm^2. Valve area (Vmean): 2.57 cm^2. - Aorta: Aortic root dimension: 43 mm (ED). - Aortic root: The aortic root was mildly dilated. - Mitral valve: Mildly calcified annulus. There was trivial   regurgitation. - Left atrium: The atrium was mildly dilated. - Right atrium: Central venous pressure (est): 8 mm Hg. - Atrial septum: No defect or patent foramen ovale was identified. - Tricuspid valve: There was mild regurgitation. - Pulmonary arteries: PA peak pressure: 33 mm Hg (S). - Pericardium, extracardiac: There was no pericardial effusion.   Patient Profile     84 y.o. male with a history of coronary artery disease status post two-vessel CABG, diastolic heart failure, frequent UTIs, hyperlipidemia, stage III CKD, urethral stricture status post suprapubic catheter, type 2 diabetes, hypertension who presented to the hospital with chest pain. Newly reduced ejection fraction on echo as compared to April 2019.   Assessment & Plan    Principal Problem:   NSTEMI (non-ST elevated myocardial infarction) ([place]) Active Problems:   Diabetes mellitus type 2 in obese ([place])    Stroke [facility])   Essential hypertension   CKD (chronic kidney disease) stage 3, GFR 30-59 ml/min (HCC)   Leukocytosis   Chronic diastolic CHF (congestive heart failure) (H[facility])   History of suprapubic catheter   Chronic anemia  1. Nonstemi - his ejection fraction has dropped since April. He has had chest pain this admission. Although he had an angiogram in April, it would be best to ensure his troponin elevation and his drop in EF are not related to new flow limiting lesions.  He is anemic, there is red tinged fluid in his Foley bag but he has not had frank brb from the suprapubic catheter. No other significant bleeding endorsed.   Trop peaked at 7.39. ECG changes with t wave inversions concerning for ischemia.  2. HTN - better controlled today, will continue current regimen through coronary angiography and determine best homegoing regimen after ischemia eval.   Remainder per IM.   INFORMED CONSENT:  I have reviewed the risks, indications, and alternatives to cardiac catheterization, possible angioplasty, and stenting with the patient. Risks include but are not limited to bleeding, infection, vascular injury, stroke, myocardial infection, arrhythmia, kidney injury, radiation-related injury in the case of prolonged fluoroscopy use, emergency cardiac surgery, and death. The patient understands the risks of serious complication is 1-2 in 0034 with diagnostic cardiac cath and 1-2% or less with angioplasty/stenting.      For questions or updates, please contact [place] Please consult [email or web address] for contact info under        Signed, Elouise Munroe, MD  04/18/2018, 8:14 AM

## 2018-04-19 ENCOUNTER — Encounter (HOSPITAL_COMMUNITY): Payer: Self-pay | Admitting: Cardiology

## 2018-04-19 LAB — GLUCOSE, CAPILLARY
GLUCOSE-CAPILLARY: 137 mg/dL — AB (ref 70–99)
Glucose-Capillary: 244 mg/dL — ABNORMAL HIGH (ref 70–99)
Glucose-Capillary: 160 mg/dL — ABNORMAL HIGH (ref 70–99)
Glucose-Capillary: 219 mg/dL — ABNORMAL HIGH (ref 70–99)

## 2018-04-19 LAB — COMPREHENSIVE METABOLIC PANEL
ALT: 10 U/L (ref 0–44)
AST: 20 U/L (ref 15–41)
Albumin: 3 g/dL — ABNORMAL LOW (ref 3.5–5.0)
Alkaline Phosphatase: 56 U/L (ref 38–126)
Anion gap: 10 (ref 5–15)
BILIRUBIN TOTAL: 0.4 mg/dL (ref 0.3–1.2)
BUN: 26 mg/dL — ABNORMAL HIGH (ref 8–23)
CO2: 23 mmol/L (ref 22–32)
Calcium: 8.4 mg/dL — ABNORMAL LOW (ref 8.9–10.3)
Chloride: 105 mmol/L (ref 98–111)
Creatinine, Ser: 1.51 mg/dL — ABNORMAL HIGH (ref 0.61–1.24)
GFR calc Af Amer: 48 mL/min — ABNORMAL LOW (ref >60)
GFR calc non Af Amer: 42 mL/min — ABNORMAL LOW (ref >60)
Glucose, Bld: 125 mg/dL — ABNORMAL HIGH (ref 70–99)
POTASSIUM: 3.4 mmol/L — AB (ref 3.5–5.1)
Sodium: 138 mmol/L (ref 135–145)
TOTAL PROTEIN: 5.3 g/dL — AB (ref 6.5–8.1)

## 2018-04-19 LAB — CBC
HCT: 25.9 % — ABNORMAL LOW (ref 39.0–52.0)
Hemoglobin: 8.3 g/dL — ABNORMAL LOW (ref 13.0–17.0)
MCH: 27.8 pg (ref 26.0–34.0)
MCHC: 32 g/dL (ref 30.0–36.0)
MCV: 86.6 fL (ref 80.0–100.0)
Platelets: 229 10*3/uL (ref 150–400)
RBC: 2.99 MIL/uL — ABNORMAL LOW (ref 4.22–5.81)
RDW: 18.5 % — ABNORMAL HIGH (ref 11.5–15.5)
WBC: 6.9 10*3/uL (ref 4.0–10.5)
nRBC: 0 % (ref 0.0–0.2)

## 2018-04-19 MED ORDER — ISOSORBIDE MONONITRATE ER 60 MG PO TB24
60.0000 mg | ORAL_TABLET | Freq: Two times a day (BID) | ORAL | Status: DC
  Administered 2018-04-19 – 2018-04-21 (×4): 60 mg via ORAL
  Filled 2018-04-19 (×4): qty 1

## 2018-04-19 MED ORDER — FUROSEMIDE 20 MG PO TABS: 20.0000 mg | ORAL_TABLET | Freq: Two times a day (BID) | ORAL | Status: DC

## 2018-04-19 MED ORDER — FUROSEMIDE 40 MG PO TABS
40.0000 mg | ORAL_TABLET | Freq: Every day | ORAL | Status: DC
  Filled 2018-04-19: qty 1

## 2018-04-19 MED ORDER — CARVEDILOL 6.25 MG PO TABS
6.2500 mg | ORAL_TABLET | Freq: Two times a day (BID) | ORAL | Status: DC
  Administered 2018-04-19 – 2018-04-21 (×4): 6.25 mg via ORAL
  Filled 2018-04-19 (×3): qty 1

## 2018-04-19 MED ORDER — HEPARIN SODIUM (PORCINE) 5000 UNIT/ML IJ SOLN
5000.0000 [IU] | Freq: Three times a day (TID) | INTRAMUSCULAR | Status: DC
  Administered 2018-04-19 – 2018-04-21 (×6): 5000 [IU] via SUBCUTANEOUS
  Filled 2018-04-19 (×6): qty 1

## 2018-04-19 MED FILL — Nitroglycerin IV Soln 100 MCG/ML in D5W: INTRA_ARTERIAL | Qty: 10 | Status: AC

## 2018-04-19 NOTE — Progress Notes (Signed)
TR Band removed, pressure dressing applied R radial.  Site elevated and level 0.   Educated pt. Call bell in reach. Saunders Revel T

## 2018-04-19 NOTE — Progress Notes (Signed)
[place] TEAM 1 - Stepdown/ICU TEAM  KJ IMBERT  QIO:[id number] DOB: 12-24-1930 DOA: 04/16/2018 PCP: Suzan Garibaldi, FNP    Brief Narrative:  84 y.o. male with a hx of CAD status post CABG and PCI, chronic diastolic congestive heart failure, frequent UTIs, urethral stricture > subsequent suprapubic catheter placement, DM2, HTN, CVA, AAA, chronic lower extremity edema, OSA, hyperlipidemia, and CKD 3 who presented w/ one day of chest pain.    In the ED his first troponin was 0.10, and his EKG was without acute abnormality.   Significant Events: 11/30 admit  12/1 TTE 12/2 cardiac cath   Subjective: The patient is feeling better today.  He denies chest pain shortness of breath nausea or vomiting.  He is beginning to move about the room some but has not yet ambulated to a significant extent.  He has noted significant decrease in his lower extremity edema.  Assessment & Plan:  NSTEMI Care per Cards - now s/p cardiac cath w/ intervention: mostly stable angiography with 70% ostial stent in-stent restenosis of SVG-RPDA treated with balloon angioplasty, there is severe native vessel disease with occluded RCA and mid-distal LAD, patent LIMA-LAD and the remainder of the SVG-PDA - recs as follows: continue dual antiplatelet therapy -okay to stop aspirin after 6 months > continue Plavix long-term  ACUTE ON CHRONIC COMBINED SYSTOLIC AND DIASTOLIC HEART FAILURE TTE noted EF 35-40%, and grade 2 DD which are new findings since April 2019 - cont to diurese - follow Is/Os and daily weights - f/u renal fxn w/ adjustment in diuretic dose   Filed Weights   04/16/18 2131 04/17/18 0102  Weight: 98.4 kg 95.8 kg    HTN w/ urgency BP improved  Blocked suprapubic cath Changed during different visit to ED 11/30 - now draining w/o difficulty  DM2 CBG controlled  CKD Stage 3 crt is stable - follow w/ increase in diuretic dosing   Recent Labs  Lab 04/16/18 2137 04/17/18 0234 04/18/18 0136  04/19/18 0259  CREATININE 1.51* 1.35* 1.37* 1.51*     DVT prophylaxis: SQ heparin  Code Status: FULL CODE Family Communication: no family present at time of exam  Disposition Plan: Transfer to telemetry bed -diuretic dosing adjusted by cardiology -follow-up renal function in a.m. -potential discharge 12/4 if renal function stable and patient clinically stable  Consultants:  [facility]   Antimicrobials:  none  Objective: Blood pressure (!) 122/58, pulse (!) 51, temperature 98.3 F (36.8 C), temperature source Oral, resp. rate 18, height 6\' 1"  (1.854 m), weight 95.8 kg, SpO2 96 %.  Intake/Output Summary (Last 24 hours) at 04/19/2018 1157 Last data filed at 04/19/2018 0900 Gross per 24 hour  Intake 600.65 ml  Output 3125 ml  Net -2524.35 ml   Filed Weights   04/16/18 2131 04/17/18 0102  Weight: 98.4 kg 95.8 kg    Examination: General: No acute respiratory distress - alert and oriented  Lungs: Clear to auscultation B - no wheezing  Cardiovascular: RRR - no M or rub  Abdomen: Nontender, nondistended, soft, bowel sounds positive, no rebound Extremities: trace edema bilateral lower extremities  CBC: Recent Labs  Lab 04/17/18 0234 04/18/18 0136 04/19/18 0259  WBC 10.8* 9.1 6.9  HGB 9.1* 8.9* 8.3*  HCT 28.3* 27.7* 25.9*  MCV 87.6 86.8 86.6  PLT 276 267 324   Basic Metabolic Panel: Recent Labs  Lab 04/17/18 0234 04/18/18 0136 04/19/18 0259  NA 138 133* 138  K 3.6 3.9 3.4*  CL 104 101 105  CO2 23 24 23   GLUCOSE 135* 141* 125*  BUN 28* 27* 26*  CREATININE 1.35* 1.37* 1.51*  CALCIUM 8.8* 8.4* 8.4*   GFR: Estimated Creatinine Clearance: 40.4 mL/min (A) (by C-G formula based on SCr of 1.51 mg/dL (H)).  Liver Function Tests: Recent Labs  Lab 04/19/18 0259  AST 20  ALT 10  ALKPHOS 56  BILITOT 0.4  PROT 5.3*  ALBUMIN 3.0*    Cardiac Enzymes: Recent Labs  Lab 04/17/18 0611 04/17/18 1312 04/17/18 1942 04/18/18 0136 04/18/18 0645  TROPONINI  1.95* 4.68* 7.39* 7.18* 6.34*    HbA1C: Hgb A1c MFr Bld  Date/Time Value Ref Range Status  04/17/2018 02:34 AM 8.2 (H) 4.8 - 5.6 % Final    Comment:    (NOTE) Pre diabetes:          5.7%-6.4% Diabetes:              >6.4% Glycemic control for   <7.0% adults with diabetes   03/26/2016 11:17 AM 7.1 (H) 4.8 - 5.6 % Final    Comment:    (NOTE)         Pre-diabetes: 5.7 - 6.4         Diabetes: >6.4         Glycemic control for adults with diabetes: <7.0     CBG: Recent Labs  Lab 04/17/18 2209 04/18/18 0756 04/18/18 1209 04/18/18 2124 04/19/18 0803  GLUCAP 196* 104* 219* 233* 137*    Recent Results (from the past 240 hour(s))  MRSA PCR Screening     Status: None   Collection Time: 04/17/18  1:00 AM  Result Value Ref Range Status   MRSA by PCR NEGATIVE NEGATIVE Final    Comment:        The GeneXpert MRSA Assay (FDA approved for NASAL specimens only), is one component of a comprehensive MRSA colonization surveillance program. It is not intended to diagnose MRSA infection nor to guide or monitor treatment for MRSA infections. Performed at [facility], [place] [address], [place], [place] [address]      Scheduled Meds: . amLODipine  10 mg Oral QPM  . aspirin  81 mg Oral Daily  . carvedilol  12.5 mg Oral BID WC  . clopidogrel  75 mg Oral Daily  . doxazosin  1 mg Oral Daily  . hydrALAZINE  100 mg Oral TID  . insulin aspart  0-9 Units Subcutaneous TID WC  . isosorbide mononitrate  60 mg Oral BID  . sodium chloride flush  3 mL Intravenous Q12H     LOS: 2 days   Cherene Altes, MD [facility] Office  [phone] Pager - Text Page per Shea Evans  If 7PM-7AM, please contact night-coverage per Amion 04/19/2018, 11:57 AM

## 2018-04-19 NOTE — Progress Notes (Signed)
CARDIAC REHAB PHASE I   PRE:  Rate/Rhythm: 58 SB  BP:  Supine:   Sitting: 118/61  Standing:    SaO2: 95%RA  MODE:  Ambulation: 340 ft   POST:  Rate/Rhythm: 68 SR  BP:  Supine:   Sitting: 131/62  Standing:    SaO2: 99%RA 1035-1130 Pt walked 340 ft on RA with minimal asst with steady gait. No CP. Reviewed MI restrictions, NTG use, gave heart healthy and diabetic diets, encouraged daily weights and watching sodium, encouraged walking as tolerated and CRP 2. Will refer to National program. Pt unsure if he can attend since he takes care of wife and mother-in-law.     Graylon Good, RN BSN  04/19/2018 11:26 AM

## 2018-04-19 NOTE — Progress Notes (Signed)
Progress Note  Patient Name: [name] Date of Encounter: 04/19/2018  Primary Cardiologist: Glenetta Hew, MD   Subjective   Feeling "fine and dandy" today.   Inpatient Medications    Scheduled Meds: . amLODipine  10 mg Oral QPM  . aspirin  81 mg Oral Daily  . carvedilol  12.5 mg Oral BID WC  . clopidogrel  75 mg Oral Daily  . doxazosin  1 mg Oral Daily  . hydrALAZINE  100 mg Oral TID  . insulin aspart  0-9 Units Subcutaneous TID WC  . isosorbide mononitrate  60 mg Oral BID  . sodium chloride flush  3 mL Intravenous Q12H   Continuous Infusions: . sodium chloride     PRN Meds: sodium chloride, acetaminophen, morphine injection, nitroGLYCERIN, ondansetron (ZOFRAN) IV, oxybutynin, sodium chloride flush   Vital Signs    Vitals:   04/19/18 0030 04/19/18 0100 04/19/18 0300 04/19/18 0806  BP: 131/62 134/63 136/64 (!) 148/70  Pulse: (!) 58 (!) 59 (!) 59   Resp: (!) 24 14 17    Temp:   98.3 F (36.8 C) 98.3 F (36.8 C)  TempSrc:   Oral Oral  SpO2: 97% 96% 93%   Weight:      Height:        Intake/Output Summary (Last 24 hours) at 04/19/2018 1055 Last data filed at 04/19/2018 0900 Gross per 24 hour  Intake 600.65 ml  Output 3125 ml  Net -2524.35 ml   Filed Weights   04/16/18 2131 04/17/18 0102  Weight: 98.4 kg 95.8 kg    Telemetry    Sinus brady with occasional bundle branch block - Personally Reviewed  ECG     SR, t wave inversions - Personally Reviewed  Physical Exam   GEN: No acute distress.   Neck: No JVD Cardiac: bradycardic, no murmurs. Respiratory: Clear to auscultation bilaterally. GI: Soft, nontender, non-distended  GU: suprapubic catheter in place. MS: No edema; No deformity. Neuro:  Nonfocal  Psych: Normal affect   Labs    Chemistry Recent Labs  Lab 04/17/18 0234 04/18/18 0136 04/19/18 0259  NA 138 133* 138  K 3.6 3.9 3.4*  CL 104 101 105  CO2 23 24 23   GLUCOSE 135* 141* 125*  BUN 28* 27* 26*  CREATININE 1.35* 1.37*  1.51*  CALCIUM 8.8* 8.4* 8.4*  PROT  --   --  5.3*  ALBUMIN  --   --  3.0*  AST  --   --  20  ALT  --   --  10  ALKPHOS  --   --  56  BILITOT  --   --  0.4  GFRNONAA 48* 47* 42*  GFRAA 55* 54* 48*  ANIONGAP 11 8 10      Hematology Recent Labs  Lab 04/17/18 0234 04/18/18 0136 04/19/18 0259  WBC 10.8* 9.1 6.9  RBC 3.23* 3.19* 2.99*  HGB 9.1* 8.9* 8.3*  HCT 28.3* 27.7* 25.9*  MCV 87.6 86.8 86.6  MCH 28.2 27.9 27.8  MCHC 32.2 32.1 32.0  RDW 18.2* 18.1* 18.5*  PLT 276 267 229    Cardiac Enzymes Recent Labs  Lab 04/17/18 1312 04/17/18 1942 04/18/18 0136 04/18/18 0645  TROPONINI 4.68* 7.39* 7.18* 6.34*    Recent Labs  Lab 04/16/18 2156  TROPIPOC 1.10*     BNPNo results for input(s): BNP, PROBNP in the last 168 hours.   DDimer No results for input(s): DDIMER in the last 168 hours.   Radiology    No results  found.  Cardiac Studies  Cath 04/18/18  SVG-RCA graft was visualized by angiography and is large.  Origin to Prox Graft stent is mostly 10% stenosed. Origin lesion is 75% stenosed.  Balloon angioplasty was performed on Ostial SVG-RCA Stent -- 3.25 mm [place] balloon. Post intervention, there is a 0% residual stenosis.  --------------------------------------------------------  Prox RCA lesion is 95% stenosed. Mid RCA to Dist RCA lesion is 100% stenosed. RPDA lesion is 70% stenosed.  Prox LAD lesion is 55% stenosed. Ost 1st Diag lesion is 50% stenosed.  Mid LAD lesion is 70% stenosed just prior to LIMA insertion  LIMA-LAD graft was visualized by angiography and is normal in caliber. The graft exhibits no disease. -Moderate disease at the insertion site. There is competitive flow.  --------------------------------------------------------  Mid Cx lesion is 45% stenosed. 2nd Mrg lesion is 80% stenosed.  LV end diastolic pressure is severely elevated.  Origin lesion is 75% stenosed.  Post intervention, there is a 0% residual stenosis.  Balloon  angioplasty was performed.   SUMMARY:  Mostly stable angiography with 70% ostial stent in-stent restenosis of SVG-RPDA treated with balloon angioplasty.  There is severe native vessel disease with occluded RCA and mid-distal LAD.  Patent LIMA-LAD and the remainder of the SVG-PDA.  Severely elevated LVEDP with known reduced EF by echo -consistent with ACUTE ON CHRONIC COMBINED SYSTOLIC AND DIASTOLIC HEART FAILURE   RECOMMENDATIONS:  Return to [facility] for standard post PCI care.  TR band removal per protocol.  Continue dual antiplatelet therapy -okay to stop aspirin after 6 months.  Continue Plavix long-term.  Discontinue IV nitro  We will give 2 doses of IV Lasix tonight and tomorrow morning. --Need to reassess discharge dose of diuretic  Patient Profile     84 y.o.malewith a history of coronary artery disease status post two-vessel CABG, diastolic heart failure, frequent UTIs, hyperlipidemia, stage III CKD, urethral stricture status post suprapubic catheter, type 2 diabetes, hypertension who presented to the hospital with chest pain. Newly reduced ejection fraction on echo as compared to April 2019.   Assessment & Plan   Principal Problem:   NSTEMI (non-ST elevated myocardial infarction) ([place]) Active Problems:   Diabetes mellitus type 2 in obese [facility])   Stroke [facility])   Coronary artery disease involving coronary bypass graft of native heart with angina pectoris ([place])   Essential hypertension   CKD (chronic kidney disease) stage 3, GFR 30-59 ml/min (HCC)   Leukocytosis   Chronic diastolic CHF (congestive heart failure) (HCC)   History of suprapubic catheter   Chronic anemia   Acute on chronic combined systolic and diastolic CHF (congestive heart failure) (HCC)    Acute on chronic combined systolic and diastolic CHF (congestive heart failure)  - will send home on lasix 40 mg daily. Primary team to monitor renal fx overnight with diuresis change.   NSTEMI  (non-ST elevated myocardial infarction)  Balloon angio of SVG to RPDA.  - home on aspirin, plavix. Reports statin allergy.  HTN -  Amlodipine 10 mg daily carvediolol 6.25mg  BID - was receiving 12.5 mg BID in hospital, but was slightly more bradycardic with intermittent conduction delay, will return to home dose despite newly reduced ejection fraction.  Doxazosin 1 mg daily.  Furosemide 40 mg daily.  Hydralazine 100 mg TID Imdur 60 mg BID  *was on losartan HCTZ 100-25 mg at home but this was held in hospital for renal failure, consider reinitiating as outpatient at close follow up if needed.  [facility]  will sign off.   Medication Recommendations:  See above Follow up as an outpatient: 12/10 with Dr. Ellyn Hack     Signed, Elouise Munroe, MD  04/19/2018, 10:55 AM

## 2018-04-20 LAB — CBC
HCT: 25.1 % — ABNORMAL LOW (ref 39.0–52.0)
Hemoglobin: 7.8 g/dL — ABNORMAL LOW (ref 13.0–17.0)
MCH: 27.2 pg (ref 26.0–34.0)
MCHC: 31.1 g/dL (ref 30.0–36.0)
MCV: 87.5 fL (ref 80.0–100.0)
Platelets: 245 10*3/uL (ref 150–400)
RBC: 2.87 MIL/uL — ABNORMAL LOW (ref 4.22–5.81)
RDW: 18.4 % — ABNORMAL HIGH (ref 11.5–15.5)
WBC: 7.3 10*3/uL (ref 4.0–10.5)
nRBC: 0 % (ref 0.0–0.2)

## 2018-04-20 LAB — GLUCOSE, CAPILLARY
Glucose-Capillary: 114 mg/dL — ABNORMAL HIGH (ref 70–99)
Glucose-Capillary: 283 mg/dL — ABNORMAL HIGH (ref 70–99)
Glucose-Capillary: 117 mg/dL — ABNORMAL HIGH (ref 70–99)
Glucose-Capillary: 203 mg/dL — ABNORMAL HIGH (ref 70–99)

## 2018-04-20 LAB — BASIC METABOLIC PANEL
Anion gap: 13 (ref 5–15)
BUN: 35 mg/dL — ABNORMAL HIGH (ref 8–23)
CO2: 21 mmol/L — ABNORMAL LOW (ref 22–32)
Calcium: 8.4 mg/dL — ABNORMAL LOW (ref 8.9–10.3)
Chloride: 103 mmol/L (ref 98–111)
Creatinine, Ser: 1.93 mg/dL — ABNORMAL HIGH (ref 0.61–1.24)
GFR calc Af Amer: 36 mL/min — ABNORMAL LOW (ref >60)
GFR calc non Af Amer: 31 mL/min — ABNORMAL LOW (ref >60)
Glucose, Bld: 144 mg/dL — ABNORMAL HIGH (ref 70–99)
POTASSIUM: 3.6 mmol/L (ref 3.5–5.1)
Sodium: 137 mmol/L (ref 135–145)

## 2018-04-20 LAB — NO BLOOD PRODUCTS

## 2018-04-20 MED ORDER — FUROSEMIDE 20 MG PO TABS
20.0000 mg | ORAL_TABLET | Freq: Once | ORAL | Status: AC
  Administered 2018-04-20: 20 mg via ORAL
  Filled 2018-04-20: qty 1

## 2018-04-20 MED ORDER — SODIUM CHLORIDE 0.9% IV SOLUTION: Freq: Once | INTRAVENOUS | Status: DC

## 2018-04-20 NOTE — Care Management Important Message (Signed)
Important Message  Patient Details  Name: [name] MRN: [id number] Date of Birth: [birth year]   Medicare Important Message Given:  Yes    Barb Merino Kylar Speelman 04/20/2018, 2:49 PM

## 2018-04-20 NOTE — Progress Notes (Signed)
PROGRESS NOTE    [name]  VQQ:[id number] DOB: [birth year] DOA: 04/16/2018 PCP: Suzan Garibaldi, FNP     Brief Narrative:  [name] is a 84 year old male with history of CAD status post CABG and PCI, chronic diastolic congestive heart failure, frequent UTI, urethral stricture with subsequent suprapubic catheter placement, diabetes type 2, hypertension, CVA, AAA, chronic lower extremity edema, OSA, hyperlipidemia, stage III kidney disease who presented with 1 day of chest pain.  In the emergency department, his troponin was 0.10.  EKG without acute abnormality.  He underwent echocardiogram and subsequent heart cath.  New events last 24 hours / Subjective: No new complaints today.  Denies any chest pain or shortness of breath.  His hemoglobin dropped to 7.8.  We discussed blood transfusions to maintain hemoglobin greater than 8 and cardiac patient.  Patient is a Jehovah witness and has declined blood transfusions today.  He denies any nausea, vomiting or lower extremity edema.  Assessment & Plan:   Principal Problem:   NSTEMI (non-ST elevated myocardial infarction) ([place]) Active Problems:   Diabetes mellitus type 2 in obese [facility])   Stroke [facility])   Coronary artery disease involving coronary bypass graft of native heart with angina pectoris ([place])   Essential hypertension   CKD (chronic kidney disease) stage 3, GFR 30-59 ml/min (HCC)   Leukocytosis   Chronic diastolic CHF (congestive heart failure) (HCC)   History of suprapubic catheter   Chronic anemia   Acute on chronic combined systolic and diastolic CHF (congestive heart failure) ([place])   NSTEMI -Status post cardiac cath -Mostly stable angiography with 70% ostial stent in-stent restenosis of SVG-RPDA treated with balloon angioplasty, there is severe native vessel disease with occluded RCA and mid-distal LAD, patent LIMA-LAD and the remainder of the SVG-PDA - recs as follows: continue dual antiplatelet therapy. Okay to stop  aspirin after 6 months > continue Plavix long-term  Acute on chronic combined systolic and diastolic heart failure -Echocardiogram noted EF 35 to 43%, grade 2 diastolic dysfunction which are new since April 2019 -Decrease Lasix dose today due to increase in creatinine -Lower extremity edema has improved  Acute kidney injury on CKD stage III -Decrease Lasix dose today due to increasing creatinine 1.5 --> 1.9  -Monitor BMP  Essential hypertension -Amlodipine 10 mg daily -Carvediolol 6.25mg  BID - was receiving 12.5 mg BID in hospital, but was slightly more bradycardic with intermittent conduction delay, will return to home dose despite newly reduced ejection fraction.  -Doxazosin 1 mg daily.  -Furosemide 40 mg daily --> decreased to 20mg  today  -Hydralazine 100 mg TID -Imdur 60 mg BID -Was on losartan HCTZ 100-25 mg at home but this was held in hospital for renal failure, consider reinitiating as outpatient at close follow up if needed.  Blocked suprapubic catheter -Changed during ED visit 11/30  Diabetes mellitus type 2  -Blood sugar controlled  Acute on chronic anemia of chronic disease -Hgb 7.8 today, patient Jehovah's Witness and has refused blood transfusion    DVT prophylaxis: Subq hep Code Status: Full Family Communication: No family at bedside, patient declined update Disposition Plan: Remain inpatient hospitalization due to increase in Cr in setting of diuresis. Decrease diuretic dose today and monitor BMP tomorrow AM.    Consultants:   Cardiology    Antimicrobials:  Anti-infectives (From admission, onward)   None        Objective: Vitals:   04/20/18 0024 04/20/18 0416 04/20/18 0732 04/20/18 1100  BP: 131/64 127/83 125/68 140/67  Pulse: Marland Kitchen)  57     Resp: 19 15 13 16   Temp: 98.7 F (37.1 C) 98.2 F (36.8 C) 97.8 F (36.6 C)   TempSrc: Oral Oral Oral   SpO2: 96% 97%    Weight:      Height:        Intake/Output Summary (Last 24 hours) at 04/20/2018  1329 Last data filed at 04/20/2018 1000 Gross per 24 hour  Intake -  Output 350 ml  Net -350 ml   Filed Weights   04/16/18 2131 04/17/18 0102  Weight: 98.4 kg 95.8 kg    Examination:  General exam: Appears calm and comfortable  Respiratory system: Clear to auscultation. Respiratory effort normal. Cardiovascular system: S1 & S2 heard, bradycardic, regular rhythm. No JVD, murmurs, rubs, gallops or clicks. No pedal edema. Gastrointestinal system: Abdomen is nondistended, soft and nontender. No organomegaly or masses felt. Normal bowel sounds heard. Central nervous system: Alert and oriented. No focal neurological deficits. Extremities: Symmetric 5 x 5 power. Skin: No rashes, lesions or ulcers Psychiatry: Judgement and insight appear normal. Mood & affect appropriate.   Data Reviewed: I have personally reviewed following labs and imaging studies  CBC: Recent Labs  Lab 04/16/18 2137 04/17/18 0234 04/18/18 0136 04/19/18 0259 04/20/18 0232  WBC 12.2* 10.8* 9.1 6.9 7.3  HGB 9.4* 9.1* 8.9* 8.3* 7.8*  HCT 31.5* 28.3* 27.7* 25.9* 25.1*  MCV 89.7 87.6 86.8 86.6 87.5  PLT 329 276 267 229 161   Basic Metabolic Panel: Recent Labs  Lab 04/16/18 2137 04/17/18 0234 04/18/18 0136 04/19/18 0259 04/20/18 0232  NA 138 138 133* 138 137  K 4.2 3.6 3.9 3.4* 3.6  CL 103 104 101 105 103  CO2 25 23 24 23  21*  GLUCOSE 176* 135* 141* 125* 144*  BUN 30* 28* 27* 26* 35*  CREATININE 1.51* 1.35* 1.37* 1.51* 1.93*  CALCIUM 9.2 8.8* 8.4* 8.4* 8.4*   GFR: Estimated Creatinine Clearance: 31.6 mL/min (A) (by C-G formula based on SCr of 1.93 mg/dL (H)). Liver Function Tests: Recent Labs  Lab 04/19/18 0259  AST 20  ALT 10  ALKPHOS 56  BILITOT 0.4  PROT 5.3*  ALBUMIN 3.0*   No results for input(s): LIPASE, AMYLASE in the last 168 hours. No results for input(s): AMMONIA in the last 168 hours. Coagulation Profile: No results for input(s): INR, PROTIME in the last 168 hours. Cardiac  Enzymes: Recent Labs  Lab 04/17/18 0611 04/17/18 1312 04/17/18 1942 04/18/18 0136 04/18/18 0645  TROPONINI 1.95* 4.68* 7.39* 7.18* 6.34*   BNP (last 3 results) No results for input(s): PROBNP in the last 8760 hours. HbA1C: No results for input(s): HGBA1C in the last 72 hours. CBG: Recent Labs  Lab 04/19/18 1138 04/19/18 1634 04/19/18 2128 04/20/18 0734 04/20/18 1248  GLUCAP 244* 160* 219* 114* 283*   Lipid Profile: Recent Labs    04/18/18 0136  CHOL 141  HDL 40*  LDLCALC 86  TRIG 75  CHOLHDL 3.5   Thyroid Function Tests: No results for input(s): TSH, T4TOTAL, FREET4, T3FREE, THYROIDAB in the last 72 hours. Anemia Panel: No results for input(s): VITAMINB12, FOLATE, FERRITIN, TIBC, IRON, RETICCTPCT in the last 72 hours. Sepsis Labs: No results for input(s): PROCALCITON, LATICACIDVEN in the last 168 hours.  Recent Results (from the past 240 hour(s))  MRSA PCR Screening     Status: None   Collection Time: 04/17/18  1:00 AM  Result Value Ref Range Status   MRSA by PCR NEGATIVE NEGATIVE Final    Comment:  The GeneXpert MRSA Assay (FDA approved for NASAL specimens only), is one component of a comprehensive MRSA colonization surveillance program. It is not intended to diagnose MRSA infection nor to guide or monitor treatment for MRSA infections. Performed at [facility], [place] [address] [place], [place] [address]        Radiology Studies: No results found.    Scheduled Meds: . sodium chloride   Intravenous Once  . amLODipine  10 mg Oral QPM  . aspirin  81 mg Oral Daily  . carvedilol  6.25 mg Oral BID WC  . clopidogrel  75 mg Oral Daily  . doxazosin  1 mg Oral Daily  . heparin injection (subcutaneous)  5,000 Units Subcutaneous Q8H  . hydrALAZINE  100 mg Oral TID  . insulin aspart  0-9 Units Subcutaneous TID WC  . isosorbide mononitrate  60 mg Oral BID  . sodium chloride flush  3 mL Intravenous Q12H   Continuous Infusions: . sodium  chloride       LOS: 3 days    Time spent: 45 minutes   Dessa Phi, DO [facility] [email or web address] Password [email or web address] 04/20/2018, 1:29 PM

## 2018-04-20 NOTE — Plan of Care (Signed)
  Problem: Education: Goal: Knowledge of General Education information will improve Description Including pain rating scale, medication(s)/side effects and non-pharmacologic comfort measures Outcome: Progressing   Problem: Health Behavior/Discharge Planning: Goal: Ability to manage health-related needs will improve Outcome: Progressing   Problem: Clinical Measurements: Goal: Ability to maintain clinical measurements within normal limits will improve Outcome: Progressing Goal: Will remain free from infection Outcome: Progressing Goal: Diagnostic test results will improve Outcome: Progressing Goal: Respiratory complications will improve Outcome: Progressing Goal: Cardiovascular complication will be avoided Outcome: Progressing   Problem: Nutrition: Goal: Adequate nutrition will be maintained Outcome: Progressing   Problem: Elimination: Goal: Will not experience complications related to bowel motility Outcome: Progressing Goal: Will not experience complications related to urinary retention Outcome: Progressing   Problem: Pain Managment: Goal: General experience of comfort will improve Outcome: Progressing   Problem: Safety: Goal: Ability to remain free from injury will improve Outcome: Progressing   Problem: Skin Integrity: Goal: Risk for impaired skin integrity will decrease Outcome: Progressing   

## 2018-04-20 NOTE — Plan of Care (Signed)
  Problem: Education: Goal: Knowledge of General Education information will improve Description Including pain rating scale, medication(s)/side effects and non-pharmacologic comfort measures Outcome: Progressing Note:  POC reviewed with pt.   

## 2018-04-20 NOTE — Progress Notes (Signed)
CARDIAC REHAB PHASE I   PRE:  Rate/Rhythm: 84 SB  BP:  Sitting: 140/67      SaO2: 92 SR  MODE:  Ambulation: 340 ft   POST:  Rate/Rhythm: 74 SR with PVCs  BP:  Sitting: 142/69    SaO2: 97 RA   Pt ambulated 323ft in hallway, standby assist. Pt states he feels "great" walking. Denies CP or SOB. C/o some R knee pain. Pt returned to recliner, call bell, phone and side table within reach. Will continue to follow.  [id number] Rufina Falco, RN BSN 04/20/2018 11:53 AM

## 2018-04-21 LAB — BASIC METABOLIC PANEL
Anion gap: 12 (ref 5–15)
BUN: 36 mg/dL — ABNORMAL HIGH (ref 8–23)
CHLORIDE: 103 mmol/L (ref 98–111)
CO2: 22 mmol/L (ref 22–32)
Calcium: 8.3 mg/dL — ABNORMAL LOW (ref 8.9–10.3)
Creatinine, Ser: 1.83 mg/dL — ABNORMAL HIGH (ref 0.61–1.24)
GFR calc Af Amer: 38 mL/min — ABNORMAL LOW (ref >60)
GFR calc non Af Amer: 33 mL/min — ABNORMAL LOW (ref >60)
Glucose, Bld: 137 mg/dL — ABNORMAL HIGH (ref 70–99)
Potassium: 3.8 mmol/L (ref 3.5–5.1)
Sodium: 137 mmol/L (ref 135–145)

## 2018-04-21 LAB — CBC
HCT: 23.5 % — ABNORMAL LOW (ref 39.0–52.0)
HEMOGLOBIN: 7.3 g/dL — AB (ref 13.0–17.0)
MCH: 27.1 pg (ref 26.0–34.0)
MCHC: 31.1 g/dL (ref 30.0–36.0)
MCV: 87.4 fL (ref 80.0–100.0)
Platelets: 228 10*3/uL (ref 150–400)
RBC: 2.69 MIL/uL — ABNORMAL LOW (ref 4.22–5.81)
RDW: 18.6 % — ABNORMAL HIGH (ref 11.5–15.5)
WBC: 5.6 10*3/uL (ref 4.0–10.5)
nRBC: 0 % (ref 0.0–0.2)

## 2018-04-21 LAB — GLUCOSE, CAPILLARY
Glucose-Capillary: 133 mg/dL — ABNORMAL HIGH (ref 70–99)
Glucose-Capillary: 253 mg/dL — ABNORMAL HIGH (ref 70–99)

## 2018-04-21 MED ORDER — DOXAZOSIN MESYLATE 1 MG PO TABS: 1.0000 mg | ORAL_TABLET | Freq: Every day | ORAL | 0 refills | Status: DC

## 2018-04-21 MED ORDER — FUROSEMIDE 20 MG PO TABS: 20.0000 mg | ORAL_TABLET | Freq: Every day | ORAL | 0 refills | Status: DC

## 2018-04-21 MED ORDER — FUROSEMIDE 20 MG PO TABS
20.0000 mg | ORAL_TABLET | Freq: Every day | ORAL | Status: DC
  Administered 2018-04-21: 20 mg via ORAL
  Filled 2018-04-21: qty 1

## 2018-04-21 NOTE — Progress Notes (Signed)
CARDIAC REHAB PHASE I   PRE:  Rate/Rhythm: 53 SB  BP:  Supine: 129/63  Sitting:   Standing:    SaO2: 99%RA  MODE:  Ambulation: 340 ft   POST:  Rate/Rhythm: 76 SR  BP:  Supine:   Sitting: 151/64  Standing:    SaO2: 97%RA 0944-1000 Pt stated he has read MI booklet. He walked 340 ft on RA with hand held asst. Pt a little wobbly due to right knee. Encouraged to get up slowly and take his time. Pt stated he has been up in room walking to bathroom without difficulty.   Graylon Good, RN BSN  04/21/2018 9:53 AM

## 2018-04-21 NOTE — Discharge Summary (Signed)
Physician Discharge Summary  [name] ZOX:[id number] DOB: March 02, 1931 DOA: 04/16/2018  PCP: Suzan Garibaldi, FNP  Admit date: 04/16/2018 Discharge date: 04/21/2018  Admitted From: Home Disposition:  Home  Recommendations for Outpatient Follow-up:  1. Follow up with PCP in 1 week 2. Follow up with Cardiology 12/10 3. 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  In the emergency department, his troponin was 0.10.  EKG without acute abnormality.  He underwent echocardiogram and subsequent heart cath. Heart cath revealed mostly stable angiography with 70% ostial stent in-stent restenosis of SVG-RPDA treated with balloon angioplasty. He was continued to dual antiplatelet therapy and was also started on diuretic.   Discharge Diagnoses:  Principal Problem:   NSTEMI (non-ST elevated myocardial infarction) ([place]) Active Problems:   Diabetes mellitus type 2 in obese [facility])   Stroke [facility])   Coronary artery disease involving coronary bypass graft of native heart with angina pectoris ([place])   Essential hypertension   CKD (chronic kidney disease) stage 3, GFR 30-59 ml/min (HCC)   Leukocytosis   Chronic diastolic CHF (congestive heart failure) (H[facility])   History of suprapubic catheter   Chronic anemia   Acute on chronic combined systolic and diastolic CHF (congestive heart failure) ([place])   NSTEMI -Status post cardiac cath -Mostly  stable angiography with 70% ostial stent in-stent restenosis of SVG-RPDA treated with balloon angioplasty, there is severe native vessel disease with occluded RCA and mid-distal LAD, patent LIMA-LAD and the remainder of the SVG-PDA- recs as follows: continue dual antiplatelet therapy. Okay to stop aspirin after 6 months> continue Plavix long-term  Acute on chronic combined systolic and diastolic heart failure -Echocardiogram noted EF 35 to 81%, grade 2 diastolic dysfunction which are new since April 2019 -Started on lasix 40mg  QD by cardiology, decreased dose to 20mg  QD due to increase in Cr  -Lower extremity edema has improved, no further SOB today   Acute kidney injury on CKD stage III -Decrease Lasix dose 12/4 due to increasing creatinine 1.5 --> 1.9 --> 1.8  -Monitor BMP  Essential hypertension -Amlodipine 10 mg daily -Carvediolol 6.25mg  BID - was receiving 12.5 mg BID in hospital, but was slightly more bradycardic with intermittent conduction delay, will return to home dose despite newly reduced ejection fraction.  -Doxazosin 1 mg daily.  -Furosemide 40 mg daily --> decreased to 20mg  due to increase in Cr  -Hydralazine 100 mg TID -Imdur 60 mg BID -Was on losartan HCTZ 100-25 mg at home but this was held in hospital for renal failure, consider reinitiating as outpatient at close follow up if needed.  Blocked suprapubic catheter -Changed during ED visit 11/30  Diabetes mellitus type 2  -Blood sugar controlled  Acute on chronic anemia of chronic disease -Hgb 7.3 today, patient Jehovah's Witness and has refused blood transfusion. Discussed with patient symptoms of orthostatic hypotension and caution in change in position and ambulation    Discharge Instructions  Discharge Instructions    (HEART FAILURE PATIENTS) Call MD:  Anytime you  have any of the following symptoms: 1) 3 pound weight gain in 24 hours or 5 pounds in 1 week 2) shortness of breath, with or without a dry  hacking cough 3) swelling in the hands, feet or stomach 4) if you have to sleep on extra pillows at night in order to breathe.   Complete by:  As directed    Amb Referral to Cardiac Rehabilitation   Complete by:  As directed    Diagnosis:   NSTEMI PTCA     Call MD for:  difficulty breathing, headache or visual disturbances   Complete by:  As directed    Call MD for:  extreme fatigue   Complete by:  As directed    Call MD for:  persistant dizziness or light-headedness   Complete by:  As directed    Call MD for:  persistant nausea and vomiting   Complete by:  As directed    Call MD for:  severe uncontrolled pain   Complete by:  As directed    Call MD for:  temperature >100.4   Complete by:  As directed    Diet - low sodium heart healthy   Complete by:  As directed    Discharge instructions   Complete by:  As directed    You were cared for by a hospitalist during your hospital stay. If you have any questions about your discharge medications or the care you received while you were in the hospital after you are discharged, you can call the unit and ask to speak with the hospitalist on call if the hospitalist that took care of you is not available. Once you are discharged, your primary care physician will handle any further medical issues. Please note that NO REFILLS for any discharge medications will be authorized once you are discharged, as it is imperative that you return to your primary care physician (or establish a relationship with a primary care physician if you do not have one) for your aftercare needs so that they can reassess your need for medications and monitor your lab values.   Increase activity slowly   Complete by:  As directed      Allergies as of 04/21/2018      Reactions   Other Other (See Comments)   No  BLOOD PRODUCTS - Pt is Jehovah's Witness   Statins Other (See Comments)   Cause muscle aches   Atorvastatin Other (See Comments)   Myalgias   Crestor [rosuvastatin]  Other (See Comments)   Myalgias    Pravastatin Other (See Comments)   Myalgias   Simvastatin Other (See Comments)   Myalgias      Medication List    STOP taking these medications   losartan-hydrochlorothiazide 100-25 MG tablet Commonly known as:  HYZAAR     TAKE these medications   amLODipine 10 MG tablet Commonly known as:  NORVASC Take 10 mg by mouth every evening.   aspirin 81 MG chewable tablet Chew 1 tablet (81 mg total) by mouth daily.   carvedilol 12.5 MG tablet Commonly known as:  COREG Take 1/2 tablet ( 6.25 mg ) twice a day What changed:    how much to take  how to take this  when to take this  additional instructions   clopidogrel 75 MG tablet Commonly known as:  PLAVIX Take 1 tablet (75 mg total) by mouth daily.   doxazosin 1 MG tablet Commonly known as:  CARDURA Take 1 tablet (1 mg total) by mouth daily.  furosemide 20 MG tablet Commonly known as:  LASIX Take 1 tablet (20 mg total) by mouth daily. What changed:  when to take this   glimepiride 4 MG tablet Commonly known as:  AMARYL Take 4 mg by mouth 2 (two) times daily with a meal.   hydrALAZINE 100 MG tablet Commonly known as:  APRESOLINE Take 1 tablet (100 mg total) by mouth 3 (three) times daily.   isosorbide mononitrate 60 MG 24 hr tablet Commonly known as:  IMDUR Take 1 tablet (60 mg total) by mouth 2 (two) times daily.   metFORMIN 1000 MG tablet Commonly known as:  GLUCOPHAGE Take 1 tablet (1,000 mg total) by mouth 2 (two) times daily.   nitroGLYCERIN 0.4 MG SL tablet Commonly known as:  NITROSTAT Place 1 tablet (0.4 mg total) under the tongue every 5 (five) minutes as needed for chest pain (up to 3 doses).   oxybutynin 5 MG tablet Commonly known as:  DITROPAN Take 5 mg by mouth daily as needed for bladder spasms.      Follow-up Information    Suzan Garibaldi, [place]. Schedule an appointment as soon as possible for a visit in 1 week(s).   Specialty:  Nurse  Practitioner Contact information: [place] [address] [phone]        Leonie Man, MD. Go on 04/26/2018.   Specialty:  Cardiology Contact information: [address] [place] [address] [place] [address] [phone]          Allergies  Allergen Reactions  . Other Other (See Comments)    No  BLOOD PRODUCTS - Pt is Jehovah's Witness  . Statins Other (See Comments)    Cause muscle aches  . Atorvastatin Other (See Comments)    Myalgias  . Crestor [Rosuvastatin] Other (See Comments)    Myalgias   . Pravastatin Other (See Comments)    Myalgias  . Simvastatin Other (See Comments)    Myalgias     Consultations:  Cardiology    Procedures/Studies: Dg Chest 2 View  Result Date: 04/16/2018 CLINICAL DATA:  Initial evaluation for acute left-sided chest pain. EXAM: CHEST - 2 VIEW COMPARISON:  Prior radiograph from 01/28/2018. FINDINGS: Median sternotomy wires with underlying CABG markers noted. Stable cardiomegaly. Mediastinal silhouette unchanged, and remains within normal limits. Aortic atherosclerosis. Lungs mildly hypoinflated. Mild scattered bibasilar linear atelectatic changes with mild interstitial congestion. No frank pulmonary edema. No pleural effusion. No consolidative airspace disease. No pneumothorax. No acute osseus abnormality. Prominent degenerative changes noted about the partially visualized shoulders. IMPRESSION: 1. Stable cardiomegaly with mild interstitial congestion without frank pulmonary edema. 2. Superimposed mild scattered bibasilar atelectasis. No other active cardiopulmonary disease. 3. Sequelae of prior CABG with aortic atherosclerosis. Electronically Signed   By: Jeannine Boga M.D.   On: 04/16/2018 22:18    Echo 12/1 Study Conclusions  - Left ventricle: The cavity size was normal. Wall thickness was   increased in a pattern of severe LVH. Systolic function was   moderately reduced. The estimated ejection fraction  was in the   range of 35% to 40%. There is akinesis of the apical myocardium.   There is severe hypokinesis of the mid-apicalanteroseptal   myocardium. Features are consistent with a pseudonormal left   ventricular filling pattern, with concomitant abnormal relaxation   and increased filling pressure (grade 2 diastolic dysfunction).   Prominent muscle stranding at LV apex, but no thrombus based on   Definity contrast imaging. - Aortic valve: Mildly to moderately calcified annulus.  Trileaflet;   moderately calcified leaflets. There was mild regurgitation. Mean   gradient (S): 7 mm Hg. Peak gradient (S): 24 mm Hg. VTI ratio of   LVOT to aortic valve: 0.55. Valve area (VTI): 2.49 cm^2. Valve   area (Vmax): 1.86 cm^2. Valve area (Vmean): 2.57 cm^2. - Aorta: Aortic root dimension: 43 mm (ED). - Aortic root: The aortic root was mildly dilated. - Mitral valve: Mildly calcified annulus. There was trivial   regurgitation. - Left atrium: The atrium was mildly dilated. - Right atrium: Central venous pressure (est): 8 mm Hg. - Atrial septum: No defect or patent foramen ovale was identified. - Tricuspid valve: There was mild regurgitation. - Pulmonary arteries: PA peak pressure: 33 mm Hg (S). - Pericardium, extracardiac: There was no pericardial effusion.    Cardiac cath 12/2  SVG-RCA graft was visualized by angiography and is large.  Origin to Prox Graft stent is mostly 10% stenosed. Origin lesion is 75% stenosed.  Balloon angioplasty was performed on Ostial SVG-RCA Stent -- 3.25 mm [place] balloon. Post intervention, there is a 0% residual stenosis.  --------------------------------------------------------  Prox RCA lesion is 95% stenosed. Mid RCA to Dist RCA lesion is 100% stenosed. RPDA lesion is 70% stenosed.  Prox LAD lesion is 55% stenosed. Ost 1st Diag lesion is 50% stenosed.  Mid LAD lesion is 70% stenosed just prior to LIMA insertion  LIMA-LAD graft was visualized by angiography and  is normal in caliber. The graft exhibits no disease. -Moderate disease at the insertion site. There is competitive flow.  --------------------------------------------------------  Mid Cx lesion is 45% stenosed. 2nd Mrg lesion is 80% stenosed.  LV end diastolic pressure is severely elevated.  Origin lesion is 75% stenosed.  Post intervention, there is a 0% residual stenosis.  Balloon angioplasty was performed.   SUMMARY:  Mostly stable angiography with 70% ostial stent in-stent restenosis of SVG-RPDA treated with balloon angioplasty.  There is severe native vessel disease with occluded RCA and mid-distal LAD.  Patent LIMA-LAD and the remainder of the SVG-PDA.  Severely elevated LVEDP with known reduced EF by echo -consistent with ACUTE ON CHRONIC COMBINED SYSTOLIC AND DIASTOLIC HEART FAILURE   RECOMMENDATIONS:  Return to [facility] for standard post PCI care.  TR band removal per protocol.  Continue dual antiplatelet therapy -okay to stop aspirin after 6 months.  Continue Plavix long-term.  Discontinue IV nitro  We will give 2 doses of IV Lasix tonight and tomorrow morning. --Need to reassess discharge dose of diuretic    Discharge Exam: Vitals:   04/21/18 0230 04/21/18 0756  BP: 96/66 128/65  Pulse: (!) 55 (!) 53  Resp: 12 17  Temp: 97.9 F (36.6 C) 98.1 F (36.7 C)  SpO2: 96%     General: Pt is alert, awake, not in acute distress Cardiovascular: bradycardic, regular rhythm, S1/S2 +, no rubs, no gallops Respiratory: CTA bilaterally, no wheezing, no rhonchi, on room air  Abdominal: Soft, NT, ND, bowel sounds + Extremities: no edema, no cyanosis    The results of significant diagnostics from this hospitalization (including imaging, microbiology, ancillary and laboratory) are listed below for reference.     Microbiology: Recent Results (from the past 240 hour(s))  MRSA PCR Screening     Status: None   Collection Time: 04/17/18  1:00 AM  Result  Value Ref Range Status   MRSA by PCR NEGATIVE NEGATIVE Final    Comment:        The GeneXpert MRSA Assay (FDA approved for  NASAL specimens only), is one component of a comprehensive MRSA colonization surveillance program. It is not intended to diagnose MRSA infection nor to guide or monitor treatment for MRSA infections. Performed at [facility], [place] [address] [place], [place] [address]      Labs: BNP (last 3 results) Recent Labs    12/13/17 1420  BNP 440.3*   Basic Metabolic Panel: Recent Labs  Lab 04/17/18 0234 04/18/18 0136 04/19/18 0259 04/20/18 0232 04/21/18 0529  NA 138 133* 138 137 137  K 3.6 3.9 3.4* 3.6 3.8  CL 104 101 105 103 103  CO2 23 24 23  21* 22  GLUCOSE 135* 141* 125* 144* 137*  BUN 28* 27* 26* 35* 36*  CREATININE 1.35* 1.37* 1.51* 1.93* 1.83*  CALCIUM 8.8* 8.4* 8.4* 8.4* 8.3*   Liver Function Tests: Recent Labs  Lab 04/19/18 0259  AST 20  ALT 10  ALKPHOS 56  BILITOT 0.4  PROT 5.3*  ALBUMIN 3.0*   No results for input(s): LIPASE, AMYLASE in the last 168 hours. No results for input(s): AMMONIA in the last 168 hours. CBC: Recent Labs  Lab 04/17/18 0234 04/18/18 0136 04/19/18 0259 04/20/18 0232 04/21/18 0529  WBC 10.8* 9.1 6.9 7.3 5.6  HGB 9.1* 8.9* 8.3* 7.8* 7.3*  HCT 28.3* 27.7* 25.9* 25.1* 23.5*  MCV 87.6 86.8 86.6 87.5 87.4  PLT 276 267 229 245 228   Cardiac Enzymes: Recent Labs  Lab 04/17/18 0611 04/17/18 1312 04/17/18 1942 04/18/18 0136 04/18/18 0645  TROPONINI 1.95* 4.68* 7.39* 7.18* 6.34*   BNP: Invalid input(s): POCBNP CBG: Recent Labs  Lab 04/20/18 0734 04/20/18 1248 04/20/18 1720 04/20/18 2053 04/21/18 0809  GLUCAP 114* 283* 117* 203* 133*   D-Dimer No results for input(s): DDIMER in the last 72 hours. Hgb A1c No results for input(s): HGBA1C in the last 72 hours. Lipid Profile No results for input(s): CHOL, HDL, LDLCALC, TRIG, CHOLHDL, LDLDIRECT in the last 72 hours. Thyroid function  studies No results for input(s): TSH, T4TOTAL, T3FREE, THYROIDAB in the last 72 hours.  Invalid input(s): FREET3 Anemia work up No results for input(s): VITAMINB12, FOLATE, FERRITIN, TIBC, IRON, RETICCTPCT in the last 72 hours. Urinalysis    Component Value Date/Time   COLORURINE AMBER (A) 04/17/2018 0715   APPEARANCEUR CLOUDY (A) 04/17/2018 0715   LABSPEC 1.010 04/17/2018 0715   PHURINE 7.0 04/17/2018 0715   GLUCOSEU NEGATIVE 04/17/2018 0715   HGBUR LARGE (A) 04/17/2018 0715   BILIRUBINUR NEGATIVE 04/17/2018 0715   KETONESUR NEGATIVE 04/17/2018 0715   PROTEINUR 100 (A) 04/17/2018 0715   UROBILINOGEN 0.2 01/22/2015 1031   NITRITE NEGATIVE 04/17/2018 0715   LEUKOCYTESUR LARGE (A) 04/17/2018 0715   Sepsis Labs Invalid input(s): PROCALCITONIN,  WBC,  LACTICIDVEN Microbiology Recent Results (from the past 240 hour(s))  MRSA PCR Screening     Status: None   Collection Time: 04/17/18  1:00 AM  Result Value Ref Range Status   MRSA by PCR NEGATIVE NEGATIVE Final    Comment:        The GeneXpert MRSA Assay (FDA approved for NASAL specimens only), is one component of a comprehensive MRSA colonization surveillance program. It is not intended to diagnose MRSA infection nor to guide or monitor treatment for MRSA infections. Performed at [facility], [place] [address], [place], [place] [address]      Patient was seen and examined on the day of discharge and was found to be in stable condition. Time coordinating discharge: 35 minutes including assessment  and coordination of care, as well as examination of the patient.   SIGNED:  Dessa Phi, DO [facility] Pager [phone]  If 7PM-7AM, please contact night-coverage [email or web address] Password [facility] 04/21/2018, 9:23 AM

## 2018-04-21 NOTE — Care Management Note (Signed)
Case Management Note  Patient Details  Name: GUENTHER DUNSHEE MRN: [id number] Date of Birth: 1930-08-23  Subjective/Objective:       Pt admitted with CP             Action/Plan:  PTA pt was completely independent from home.  Pt informed CM that he has a walker and cane in the home - however he doesn't have to use them.  Pt will transport home via private vehicle  Expected Discharge Date:  04/21/18               Expected Discharge Plan:  Home/Self Care(Independent from home with wife, has PCP and denied barriers with paying for medications)  In-House Referral:     Discharge planning Services     Post Acute Care Choice:    Choice offered to:     DME Arranged:    DME Agency:     HH Arranged:    [place] Agency:     Status of Service:  Completed, signed off  If discussed at [employer] of Stay Meetings, dates discussed:    Additional Comments:  Maryclare Labrador, RN 04/21/2018, 9:43 AM

## 2018-04-21 NOTE — Progress Notes (Signed)
Discharged home by wheelchair, accompanied by nephew and friends. Discharge instructions given to pt. Belongings taken home.

## 2018-04-21 NOTE — Consult Note (Signed)
   [facility] CM Inpatient Consult   04/21/2018  [name] 05/04/1931 [id number]   Patient screened for multiple admissions with a high risk score [29%] for unplanned readmissions in [facility] and assessed for post hospital Care Management services needed with Medicare. Admitted with NSTEMI.    Met with the patient at the bedside.  Patient endorses Cheryl Wells, NP as his primary care provider in [place].  Patient denies any needs for transportation to MD appointments, no medications needs voiced, no concerns for resources at this time. Lives with wife. Provided the patient with a brochure, 24 hour nurse advise line, and contact information.  Patient verbalized understanding for contacts and follow up General EMMI calls.     For questions please contact:    , RN BSN CCM [facility] Liaison  [phone] business mobile phone Toll free office [phone]   

## 2018-04-26 ENCOUNTER — Encounter: Payer: Self-pay | Admitting: Cardiology

## 2018-04-26 ENCOUNTER — Ambulatory Visit (INDEPENDENT_AMBULATORY_CARE_PROVIDER_SITE_OTHER): Payer: Medicare Other | Admitting: Cardiology

## 2018-04-26 VITALS — BP 116/70 | HR 55 | Ht 73.0 in | Wt 213.0 lb

## 2018-04-26 DIAGNOSIS — N183 Chronic kidney disease, stage 3 unspecified: Secondary | ICD-10-CM

## 2018-04-26 DIAGNOSIS — Z95820 Peripheral vascular angioplasty status with implants and grafts: Secondary | ICD-10-CM | POA: Diagnosis not present

## 2018-04-26 DIAGNOSIS — R5383 Other fatigue: Secondary | ICD-10-CM

## 2018-04-26 DIAGNOSIS — E785 Hyperlipidemia, unspecified: Secondary | ICD-10-CM | POA: Diagnosis not present

## 2018-04-26 DIAGNOSIS — I25119 Atherosclerotic heart disease of native coronary artery with unspecified angina pectoris: Secondary | ICD-10-CM

## 2018-04-26 DIAGNOSIS — I5042 Chronic combined systolic (congestive) and diastolic (congestive) heart failure: Secondary | ICD-10-CM | POA: Diagnosis not present

## 2018-04-26 DIAGNOSIS — D649 Anemia, unspecified: Secondary | ICD-10-CM

## 2018-04-26 DIAGNOSIS — I420 Dilated cardiomyopathy: Secondary | ICD-10-CM

## 2018-04-26 DIAGNOSIS — I10 Essential (primary) hypertension: Secondary | ICD-10-CM | POA: Diagnosis not present

## 2018-04-26 DIAGNOSIS — I25709 Atherosclerosis of coronary artery bypass graft(s), unspecified, with unspecified angina pectoris: Secondary | ICD-10-CM

## 2018-04-26 DIAGNOSIS — E1169 Type 2 diabetes mellitus with other specified complication: Secondary | ICD-10-CM | POA: Diagnosis not present

## 2018-04-26 DIAGNOSIS — I2510 Atherosclerotic heart disease of native coronary artery without angina pectoris: Secondary | ICD-10-CM

## 2018-04-26 DIAGNOSIS — Z9861 Coronary angioplasty status: Secondary | ICD-10-CM

## 2018-04-26 DIAGNOSIS — I200 Unstable angina: Secondary | ICD-10-CM

## 2018-04-26 HISTORY — DX: Dilated cardiomyopathy: I42.0

## 2018-04-26 MED ORDER — HYDRALAZINE HCL 100 MG PO TABS: 100.0000 mg | ORAL_TABLET | Freq: Two times a day (BID) | ORAL | 3 refills | Status: DC

## 2018-04-26 MED ORDER — CARVEDILOL 6.25 MG PO TABS: ORAL_TABLET | ORAL | 3 refills | Status: DC

## 2018-04-26 NOTE — Patient Instructions (Signed)
Medication Instructions:  You can hold taking aspirin if you have a nose bleed for few days  May take  Extra dose of lasix if needed for  Shortness of breathe.   decrease dose carvedilol to 3.125 mg in the morning  ( 1/2 tablet of 6.25 mg)  And take 6.25 mg in the evening.  after 1 week of the reduce dose of carvedilol  Decrease Imdur to 1/2 tablet a day . Take for one month if no chest pain  You can stop medication , but if use NTG  continue with Imdur.  DECREASE HYDRALAZINE TO 100 MG TWICE A DAY.   If you need a refill on your cardiac medications before your next appointment, please call your pharmacy.   Lab work:   DO LABS WHEN YOU HAVE NOT HAD ANYTHING TO EAT OR DRINK THE MORNING OF LABS LIPID CMP CBC TSH   If you have labs (blood work) drawn today and your tests are completely normal, you will receive your results only by: Marland Kitchen MyChart Message (if you have MyChart) OR . A paper copy in the mail If you have any lab test that is abnormal or we need to change your treatment, we will call you to review the results.  Testing/Procedures: SCHEDULE AT [facility] MARCH 2020 Your physician has requested that you have an echocardiogram. Echocardiography is a painless test that uses sound waves to create images of your heart. It provides your doctor with information about the size and shape of your heart and how well your heart's chambers and valves are working. This procedure takes approximately one hour. There are no restrictions for this procedure.    Follow-Up: At [facility], you and your health needs are our priority.  As part of our continuing mission to provide you with exceptional heart care, we have created designated Provider Care Teams.  These Care Teams include your primary Cardiologist (physician) and Advanced Practice Providers (APPs -  Physician Assistants and Nurse Practitioners) who all work together to provide you with the care you need, when  you need it. You will need a follow up appointment in 3 months.  Please call our office 2 months in advance to schedule this appointment.  You may see Glenetta Hew, MD or one of the following Advanced Practice Providers on your designated Care Team:   Rosaria Ferries, PA-C . Jory Sims, DNP, ANP  Any Other Special Instructions Will Be Listed Below (If Applicable).

## 2018-04-26 NOTE — Progress Notes (Signed)
PCP: Suzan Garibaldi, FNP  Clinic Note: Chief Complaint  Patient presents with  . Hospitalization Follow-up    Had mild non-STEMI in the setting of urologic issues.  PTCA of ISR and SVG-RCA  . Coronary Artery Disease    No further angina after PTCA.  Headache on Imdur  . Fatigue    HPI: [name] is a 84 y.o. male with a PMH below who presents today for Hospital f/u- NSTEMI.  I last saw LACOREY [name] was last seen on 12/16/2017, but he has been seen at least twice in the interim prior to his most recent hospitalization.  August 27 saw [name], [place] (hospital follow-up).  Was doing well with the increased dose of Imdur.  Was noted to be somewhat hypertensive.  No bleeding issues with aspirin and Plavix.  Recovering well from his hospital stay.  No medication changes  February 15, 2018 (also for post hospital follow-up) -noted exertional dyspnea and fatigue.  Was concerned about bradycardia.  Did not order a monitor at that time.  Are concerned about chronotropic incompetence.  Carvedilol dose reduced to 6.25 mg twice daily.  Recent Hospitalizations:   Admitted December 18, 2017 -pyelonephritis; Imdur dose increased to 60 mg daily.  Finally converted from labetalol to carvedilol.  Admitted September 13: Nonspecific chest pain.  Attempted to convert to Imdur 60 mg twice daily.  Chest pain was thought to be related to hypertension.  (Initial blood pressure over 952 mmHg systolic.)  April 16, 8412: Suprapubic catheter obstruction. -->  Had chest pain and positive troponins.  Referred for cardiac cath.  Studies Personally Reviewed - (if available, images/films reviewed: From Epic Chart or Care Everywhere)  Cath-PCI 04/18/2018: SVG-RCA stent 79% (Curlew balloon post dilation up to 3.25 mm).   Prox RCA lesion is 95% stenosed. Mid RCA to Dist RCA lesion is 100% stenosed. RPDA lesion is 70% stenosed.  Prox LAD lesion is 55% stenosed. Ost 1st Diag lesion is 50% stenosed.  Mid LAD lesion is  70% stenosed just prior to LIMA insertion  LIMA-LAD graft was visualized by angiography and is normal in caliber. The graft exhibits no disease. -Moderate disease at the insertion site. There is competitive flow.  Mid Cx lesion is 45% stenosed. 2nd Mrg lesion is 80% stenosed.  Severely elevated LVEDP -consistent with ACUTE ON CHRONIC COMBINED SYSTOLIC AND DIASTOLIC HEART FAILURE  Newly noted regional wall motion normality echo does not correlate with coronary disease. Diagnostic       Intervention       2D Echo 04/17/2018: EF 35-40%.  Severe LVH.  GRII DD.  Apical anteroseptal hypokinesis.  Akinesis of the apex.   Interval History: [name] returns here today for his initial post hospital follow-up.  Really no further anginal symptoms since discharge, he just notes that he feels really fatigued.  No energy ever since his most recent hospitalization.  He is not noticing any real heart failure symptoms just one night where he felt some orthopnea.  Mild edema but no real standing orthopnea, PND.  No more chest pain and notably less short of breath.  He is no longer on losartan-HCTZ likely because of concern for renal function.  He is only taking his once daily furosemide and not having take any additional doses.  He does note that Imdur gives him a headache with the increased dose.  He is hoping that he may be a come off of that.  He is having some more than usual bruising with  aspirin Plavix, but has a pretty good scrape on his arm that bled a lot.  He slipped and fell and scraped his arm.  Did not have any syncope or near syncope.  He denies any rapid regular heartbeats palpitations.  No lightheadedness, dizziness, weakness or syncope/near syncope. No TIA/amaurosis fugax symptoms. No melena, hematochezia, hematuria, or epstaxis. No claudication.  ROS: A comprehensive was performed. Review of Systems  Constitutional: Positive for malaise/fatigue. Negative for chills and fever.  HENT: Negative  for congestion.   Respiratory: Negative for cough and shortness of breath.   Gastrointestinal: Negative for constipation and heartburn.  Genitourinary:       No longer having any urinary issues since his most recent procedure.  Musculoskeletal: Positive for falls (Slipped and fell). Negative for joint pain.  Neurological: Negative for dizziness and focal weakness (Just his known right hand paralysis).  Psychiatric/Behavioral: Negative for depression.  All other systems reviewed and are negative.  I have reviewed and (if needed) personally updated the patient's problem list, medications, allergies, past medical and surgical history, social and family history.   Past Medical History:  Diagnosis Date  . Abdominal aortic aneurysm ([place])    a. Aortic duplex 06/2014: mild aneurysmal dilatation of proximal abdominal aorta measuring 3.4x3.4cm. No sig change from 2012. F/u due 06/2016;   . AKI (acute kidney injury) ([place]) 03/28/2016  . Arthritis    "right knee; never bothered me" (03/26/2016)  . 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 CAD in native artery    a. NSTEMI 11/2010 - CABG x2(LIMA to LAD, SVG to PDA). b. NEG Lexi MV 10/24/13, EF 53%, no perfusion abnormality, septal and apical HK noted. c. NSTEMI 05/2014 - s/p DES to SVG-RPDA 06/18/14 (Xience Alpine DES 3.0 x 18 mm -3.35 mm), EF 60-65; d. 03/2016 STEMI/PCI: LM nl, ost LAD 70%, mLCx 50%, pRCA 95% - mRCA 60%, dRPDA 70%, LIMA->LAD ok, o-p SVG->RPDA 100% (3.0x16 Promus DES overlapping prior stent).  . Diastolic dysfunction    a. 03/2016 Echo: EF 60-65%, no rwma, Gr1 DD, triv AI, Ao root 76mm, Asc Ao 73mm, triv MR.  . Dizziness    a. Carotid duplex 03/2014: mild fibrous plaque, no significant stenosis.  . Foley catheter in place    "been wearing it for a couple months now" (03/26/2016)  . HTN (hypertension)   . Hyperlipidemia LDL goal <70   . Non-STEMI (non-ST elevated myocardial infarction) ([place]) 11/2010; 03/2018    1. Ostial LAD 70% (to close to [facility] for PCI), subtotal occlusion of the RCA; 2.  In-stent restenosis SVG-RCA -PTCA.  Marland Kitchen Postoperative atrial fibrillation ([place]) 11/2010   Post CABG, no sign recurrence  . Refusal of blood transfusions as patient is Jehovah's Witness   . 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 Servando Snare)  . Sleep apnea    Not on CPAP. (03/26/2016)  . ST elevation myocardial infarction (STEMI) of inferior wall ([place]) 04/05/2016   Occluded in-stent restenosis/thrombosis of SVG-RCA --> treated with overlapping Promus DES 3.0 mm 16 mm postdilated 3.0 mm).  . Stroke [facility]) 2014; 01/2015   a. 2014 with mild right hand weakness, nonhemorrhagic per pt.;; b. - PTA-Stent L ICA 95%   . Type II diabetes mellitus ([place])     Past Surgical History:  Procedure Laterality Date  . APPENDECTOMY    . CARDIAC CATHETERIZATION  12/11/2010   Dr. Chase Picket - subsequent cath - normal LV systolic function,  no renal artery stenosis, severe 2-vessel disease with subtotaled RCA prox and distal 60% lesion and complex 70% area of narrowing of ostium of LAD  . CARDIAC CATHETERIZATION N/A 03/26/2016   Procedure: Left Heart Cath and Coronary Angiography;  Surgeon: Nelva Bush, MD;  Location: [place] CV LAB;  Service: Cardiovascular;  Laterality: N/A;  . CARDIAC CATHETERIZATION N/A 03/26/2016   Procedure: Coronary Stent Intervention;  Surgeon: Nelva Bush, MD;  Location: [place] CV LAB;  Service: Cardiovascular: 100% In-stent thrombosis of pros SVG-RCA (Xience DES) --> treated with PromusDES 3.0 x 18 (3.3 mm)  . CARDIAC CATHETERIZATION N/A 03/26/2016   Procedure: Bypass Graft Angiography;  Surgeon: Nelva Bush, MD;  Location: [place] CV LAB;  Service: Cardiovascular;  Laterality: N/A;  . CORONARY ANGIOPLASTY WITH STENT PLACEMENT  06/18/2014   PCI to SVG-RPDA 06/18/14 (Xience Alpine DES 3.0 x 18 mm -3.35 mm),   . CORONARY ARTERY BYPASS GRAFT  12/15/2010   X2, Dr Servando Snare; LIMA to LAD,  SVG to PDA;   . CORONARY BALLOON ANGIOPLASTY N/A 04/18/2018   Procedure: CORONARY BALLOON ANGIOPLASTY;  Surgeon: Leonie Man, MD;  Location: [place] CV LAB;  Service: Cardiovascular;;; high pressure scoring and noncompliant balloon PTCA of SVG-RCA ISR ostial and proximal  . CORONARY/GRAFT ANGIOGRAPHY N/A 08/27/2017   Procedure: CORONARY/GRAFT ANGIOGRAPHY;  Surgeon: Nelva Bush, MD;  Location: [place] CV LAB;  Service: Cardiovascular;; pLAD 70%, ostD1 50%.  mCx 60%, OM2 80%. pRCA 95% & mRCA 100% - rPDA 70%. LIMA-mLAD patent. SVG-rPDA 10% ISR.   Marland Kitchen CYSTOSCOPY WITH URETHRAL DILATATION    . IR ANGIO INTRA EXTRACRAN SEL COM CAROTID INNOMINATE BILAT MOD SED  02/25/2017  . IR ANGIO INTRA EXTRACRAN SEL COM CAROTID INNOMINATE BILAT MOD SED  10/19/2017   Dr. Kathee Delton: L Common Carotid - ECA & major branches widely patent. ICA ~20% distal to bulb & 50% in supraclinoid segment. LMCA-distal 1/3 MI ~905 stenosis with post-stenotic dilation into inferior division. ~50% prox Basilar A stenosis @ anterior Inf Cerebellar A. 50% R ICA  . IR ANGIO VERTEBRAL SEL SUBCLAVIAN INNOMINATE UNI L MOD SED  02/25/2017  . IR ANGIO VERTEBRAL SEL SUBCLAVIAN INNOMINATE UNI R MOD SED  10/19/2017  . IR ANGIO VERTEBRAL SEL VERTEBRAL UNI L MOD SED  10/19/2017  . IR GENERIC HISTORICAL  01/21/2016   IR RADIOLOGIST EVAL & MGMT 01/21/2016 [facility]INTER[facility] RAD  . IR GENERIC HISTORICAL  02/03/2016   IR CATHETER TUBE CHANGE 02/03/2016 Marybelle Killings, MD [facility]INTER[facility] RAD  . IR RADIOLOGIST EVAL & MGMT  11/09/2017  . LEFT HEART CATH AND CORONARY ANGIOGRAPHY N/A 04/18/2018   Procedure: LEFT HEART CATH AND CORONARY ANGIOGRAPHY;  Surgeon: Leonie Man, MD;  Location: [place] [facility];  Service: Cardiovascular;  Laterality: N/A; stable findings on last cath with exception of 75% in-stent restenosis of SVG-RCA ostial stent treated with PTCA.    Marland Kitchen LEFT HEART CATHETERIZATION WITH CORONARY ANGIOGRAM N/A 06/18/2014   Procedure: LEFT HEART CATHETERIZATION  WITH CORONARY ANGIOGRAM;  Surgeon: Leonie Man, MD;  Location: [facility] CATH LAB;  Service: Cardiovascular;  -- severe disease of SVG-rPDA  . RADIOLOGY WITH ANESTHESIA N/A 01/24/2015   Procedure: STENT ASSISTED ANGIOPLASTY (RADIOLOGY WITH ANESTHESIA);  Surgeon: Luanne Bras, MD;  Location: [place];  Service: Radiology;  Laterality: N/A;  . TONSILLECTOMY    . TRANSTHORACIC ECHOCARDIOGRAM  08/2017; 04/2018:    A) EF 60-65%. Mild LVH. No RWMA. Gr 1 DD. Mod [name] dilation.;  B)  EF 35-40%.  Severe LVH.  GRII DD.  Apical anteroseptal hypokinesis.  Akinesis of the apex.    Current Meds  Medication Sig  . amLODipine (NORVASC) 10 MG tablet Take 10 mg by mouth every evening.   Marland Kitchen aspirin 81 MG chewable tablet Chew 1 tablet (81 mg total) by mouth daily.  . clopidogrel (PLAVIX) 75 MG tablet Take 1 tablet (75 mg total) by mouth daily.  Marland Kitchen doxazosin (CARDURA) 1 MG tablet Take 1 tablet (1 mg total) by mouth daily.  . furosemide (LASIX) 20 MG tablet Take 1 tablet (20 mg total) by mouth daily.  Marland Kitchen glimepiride (AMARYL) 4 MG tablet Take 4 mg by mouth 2 (two) times daily with a meal.   . isosorbide mononitrate (IMDUR) 60 MG 24 hr tablet Take 1 tablet (60 mg total) by mouth 2 (two) times daily.  . metFORMIN (GLUCOPHAGE) 1000 MG tablet Take 1 tablet (1,000 mg total) by mouth 2 (two) times daily.  . nitroGLYCERIN (NITROSTAT) 0.4 MG SL tablet Place 1 tablet (0.4 mg total) under the tongue every 5 (five) minutes as needed for chest pain (up to 3 doses).  Marland Kitchen oxybutynin (DITROPAN) 5 MG tablet Take 5 mg by mouth daily as needed for bladder spasms.   . [DISCONTINUED] carvedilol (COREG) 12.5 MG tablet Take 1/2 tablet ( 6.25 mg ) twice a day (Patient taking differently: Take 6.25 mg by mouth 2 (two) times daily with a meal. day)  . [DISCONTINUED] hydrALAZINE (APRESOLINE) 100 MG tablet Take 1 tablet (100 mg total) by mouth 3 (three) times daily.    Allergies  Allergen Reactions  . Other Other (See Comments)    No  BLOOD  PRODUCTS - Pt is Jehovah's Witness  . Statins Other (See Comments)    Cause muscle aches  . Atorvastatin Other (See Comments)    Myalgias  . Crestor [Rosuvastatin] Other (See Comments)    Myalgias   . Pravastatin Other (See Comments)    Myalgias  . Simvastatin Other (See Comments)    Myalgias     Social History   Tobacco Use  . Smoking status: Former Smoker    Packs/day: 0.33    Years: 10.00    Pack years: 3.30    Types: Cigarettes    Last attempt to quit: 1961    Years since quitting: 58.9  . Smokeless tobacco: Never Used  Substance Use Topics  . Alcohol use: No  . Drug use: No   Social History   Social History Narrative   Married father of one.  Previously uses stationary bike routinely.  Now reduced due to other social stressors.   Quit smoking 50 years ago.  Does not drink alcohol    family history includes Heart Problems (age of onset: 52) in his father.  Wt Readings from Last 3 Encounters:  04/26/18 213 lb (96.6 kg)  04/17/18 211 lb 3.2 oz (95.8 kg)  04/16/18 214 lb 15.2 oz (97.5 kg)    PHYSICAL EXAM BP 116/70   Pulse (!) 55   Ht 6\' 1"  (1.854 m)   Wt 213 lb (96.6 kg)   BMI 28.10 kg/m  Physical Exam  Constitutional: He is oriented to person, place, and time. He appears well-developed and well-nourished. No distress.  Relatively healthy-appearing elderly gentleman.  Well-groomed.  HENT:  Head: Normocephalic and atraumatic.  Neck: Normal range of motion. Neck supple. No hepatojugular reflux and no JVD present. Carotid bruit is not present.  Cardiovascular: Regular rhythm, S1 normal, S2 normal and intact distal pulses.  Occasional extrasystoles are  present. Bradycardia present. PMI is not displaced (Difficulty palpate). Exam reveals distant heart sounds. Exam reveals no gallop and no friction rub.  Murmur heard.  Harsh crescendo-decrescendo early systolic murmur is present with a grade of 1/6 at the upper right sternal border radiating to the  neck. Pulmonary/Chest: Effort normal and breath sounds normal. No respiratory distress. He has no wheezes. He has no rales.  Musculoskeletal: Normal range of motion.        General: Edema (Trivial bilateral lower extremity) present.  Neurological: He is alert and oriented to person, place, and time.  Still has the right hand paresthesia paralysis from his stroke as the only major residual effect.  Psychiatric: He has a normal mood and affect. His behavior is normal. Judgment and thought content normal.  Vitals reviewed.    Adult ECG Report  Rate: 55 ;  Rhythm: sinus bradycardia and Anterior MI, age undetermined.  Biphasic anterolateral ST and T wave changes with T wave inversions consistent with evolving anterior MI.  Notably improved from intra-hospitalization EKGs.;   Narrative Interpretation: Abnormal EKG.  Improved from hospital.   Other studies Reviewed: Additional studies/ records that were reviewed today include:  Recent Labs:   Lab Results  Component Value Date   TSH 1.220 04/27/2018    Lab Results  Component Value Date   CREATININE 1.60 (H) 04/27/2018   BUN 32 (H) 04/27/2018   NA 138 04/27/2018   K 5.8 ([place]) 04/27/2018   CL 101 04/27/2018   CO2 23 04/27/2018   Lab Results  Component Value Date   CHOL 140 04/27/2018   HDL 39 (L) 04/27/2018   [place] 81 04/27/2018   TRIG 101 04/27/2018   CHOLHDL 3.6 04/27/2018    ASSESSMENT / PLAN: Problem List Items Addressed This Visit    CAD S/P percutaneous coronary angioplasty (Chronic)    He had PTCA of in-stent restenosis of his SVG-RCA stents.  Mostly an ostial issue.  Remains on aspirin and Plavix.  After another month, we can probably stop aspirin if necessary for bleeding purposes. Unfortunately with recent PTCA we need to wait 3 months before would be okay to hold Plavix for procedures.  (Would be fine by March 2020).  Is on beta-blocker and calcium channel blocker with Imdur for antianginal effect.  Not on statin  currently because of intolerance.      Relevant Medications   carvedilol (COREG) 6.25 MG tablet   hydrALAZINE (APRESOLINE) 100 MG tablet   Chronic anemia (Chronic)    This could be relating somewhat to his fatigue, he did have a drop in hemoglobin to 7 after his last PCI.  We will check another CBC to ensure that is returning back to baseline.  Probably needs be on iron supplementation.      Relevant Orders   CBC (Completed)   TSH (Completed)   Chronic combined systolic and diastolic CHF (congestive heart failure) ([place]) (Chronic)    Could be contributing somewhat to his dyspnea.  He is no longer on his ARB but is on hydralazine and Imdur.  On steadily lower dose of carvedilol because of concerns of chronotropic incompetence and fatigue --carvedilol is relatively new for him and he was placed on 12.5 mg twice daily this summer converting from labetalol.  This was then reduced to 6.25 mg twice daily by Rosaria Ferries, PA.. We are using amlodipine for antianginal and blood pressure/afterload reduction. He is on standing dose of furosemide 20 mg daily and I discussed the importance of sliding  scale dosing for either increased weight of more than 3 pounds, more edema or more dyspnea with PND, orthopnea.  The new finding from his last hospitalization was that his EF is down.  Nothing really to explain this besides it may be it was a stress-induced cardiomyopathy.  I would like to recheck an echocardiogram in a few months to reassess, because of the reduction in EF was not consistent with the amount of change in his coronary arteries.      Relevant Medications   carvedilol (COREG) 6.25 MG tablet   hydrALAZINE (APRESOLINE) 100 MG tablet   CKD (chronic kidney disease) stage 3, GFR 30-59 ml/min (HCC) (Chronic)    Reevaluate renal function -check CMP today.      Coronary artery disease involving coronary bypass graft of native heart with angina pectoris (HCC) (Chronic)    Initial stent in the  SVG-RCA had in-stent restenosis treated with PTCA and another stent distal.  He now has had in-stent restenosis again ostially.  This was treated with PTCA. Currently on aspirin Plavix, beta-blocker and calcium channel blocker as well as Imdur and hydralazine for regular reduction. Thankfully no recurrent angina post PCI.  He does not like being on 60 mg twice daily Imdur.  I will have him reduce back to 30 mg dosing about a week after he reduces his carvedilol dose.  After about a month we will reassess his symptoms, if not having any angina I would say discontinue Imdur--restart if symptoms recur.      Relevant Medications   carvedilol (COREG) 6.25 MG tablet   hydrALAZINE (APRESOLINE) 100 MG tablet   Other Relevant Orders   EKG 12-Lead (Completed)   Lipid panel (Completed)   Comprehensive metabolic panel (Completed)   Coronary artery disease involving native coronary artery of native heart with angina pectoris (HCC) - Primary (Chronic)   Relevant Medications   carvedilol (COREG) 6.25 MG tablet   hydrALAZINE (APRESOLINE) 100 MG tablet   Other Relevant Orders   EKG 12-Lead (Completed)   ECHOCARDIOGRAM COMPLETE   Lipid panel (Completed)   Comprehensive metabolic panel (Completed)   Dilated cardiomyopathy ([place]) (Chronic)    He has reduced EF on his last echocardiogram that was done at the setting of his most recent "non-STEMI.  I suspect that this may very well have been stress related with his urologic issues.  The chest comfort began shortly after the onset of his bladder related abdominal pain.  The fact that he had anterior wall motion abnormality and anterior EKG changes but with no new findings and anterior ischemia on cath, I suspect this could have been a Takotsubo event.  Plan: Reassess echo in 3months. Continue on carvedilol as well as amlodipine.  Not on ARB because of labile renal function from his urologic issues.  He is on hydralazine/nitrate for afterload reduction.       Relevant Medications   carvedilol (COREG) 6.25 MG tablet   hydrALAZINE (APRESOLINE) 100 MG tablet   Other Relevant Orders   EKG 12-Lead (Completed)   ECHOCARDIOGRAM COMPLETE   Comprehensive metabolic panel (Completed)   Essential hypertension (Chronic)    Complicated labile pressures.  He has been up and down.  Now he is on hydralazine which I think we can easily make twice daily since he is only taking it twice daily.  He is taken 100 mg now twice daily.  Would like to see if we can wean off Imdur because of his headache issues. I am having to reduce his  carvedilol dose a little bit in the morning because of his fatigue issues hopefully this will help. Continue amlodipine at current dose.  I would allow for mild permissive hypertension but would not want his blood pressure to be over 160s for prolonged period time.  I want him to take an additional PRN hydralazine in the situation.      Relevant Medications   carvedilol (COREG) 6.25 MG tablet   hydrALAZINE (APRESOLINE) 100 MG tablet   Fatigue    Not sure what the etiology of his fatigue is.  Is thyroid levels being evaluated.  He does have anemia and so we may want to consider iron supplementation. We will recheck hemoglobin today.  Check TSH as well.  Since we have converted him from labetalol to carvedilol, this may have been part of the reason for her symptoms, we will convert morning dose of carvedilol to 3.125 mg and continue nighttime dose at 6.25 mg.      Relevant Orders   EKG 12-Lead (Completed)   ECHOCARDIOGRAM COMPLETE   CBC (Completed)   TSH (Completed)   Hyperlipidemia associated with type 2 diabetes mellitus ([place]) (Chronic)    Way overdue for lipid evaluation.  We will check today.  Had not been on a statin.  Pending results, may want to consider treating in follow-up visits.  If he is reluctant to use statins, would potentially consider Zetia or a more novel agent. Labs drawn today showed LDL of 81.  We can talk in  follow-up about treatment options.      Relevant Orders   Lipid panel (Completed)   S/P angioplasty with stent 03/26/16 to VG to PDA for in-stent restenosis with DES. (Chronic)    Back on aspirin and Plavix.  Can stop aspirin after January. By March 2020 would be okay to hold Plavix for procedures.         I spent a total of 45 minutes with the patient and chart review. >  50% of the time was spent in direct patient consultation.  Multiple medical problems, hospitalization with cath results to review as well as echocardiogram.  In addition to all this, he has his own complaints.  Current medicines are reviewed at length with the patient today.  (+/- concerns) fatigue, headache with Imdur The following changes have been made:  See below  Patient Instructions  Medication Instructions:  You can hold taking aspirin if you have a nose bleed for few days  May take  Extra dose of lasix if needed for  Shortness of breathe.   decrease dose carvedilol to 3.125 mg in the morning  ( 1/2 tablet of 6.25 mg)  And take 6.25 mg in the evening.  after 1 week of the reduce dose of carvedilol  Decrease Imdur to 1/2 tablet a day . Take for one month if no chest pain  You can stop medication , but if use NTG  continue with Imdur.  DECREASE HYDRALAZINE TO 100 MG TWICE A DAY.   If you need a refill on your cardiac medications before your next appointment, please call your pharmacy.   Lab work:   DO LABS WHEN YOU HAVE NOT HAD ANYTHING TO EAT OR DRINK THE MORNING OF LABS LIPID CMP CBC TSH   If you have labs (blood work) drawn today and your tests are completely normal, you will receive your results only by: Marland Kitchen MyChart Message (if you have MyChart) OR . A paper copy in the mail If you have any  lab test that is abnormal or we need to change your treatment, we will call you to review the results.  Testing/Procedures: SCHEDULE AT [place] MARCH 2020 Your physician has  requested that you have an echocardiogram. Echocardiography is a painless test that uses sound waves to create images of your heart. It provides your doctor with information about the size and shape of your heart and how well your heart's chambers and valves are working. This procedure takes approximately one hour. There are no restrictions for this procedure.    Follow-Up: At [facility], you and your health needs are our priority.  As part of our continuing mission to provide you with exceptional heart care, we have created designated Provider Care Teams.  These Care Teams include your primary Cardiologist (physician) and Advanced Practice Providers (APPs -  Physician Assistants and Nurse Practitioners) who all work together to provide you with the care you need, when you need it. You will need a follow up appointment in 3 months.  Please call our office 2 months in advance to schedule this appointment.  You may see Glenetta Hew, MD or one of the following Advanced Practice Providers on your designated Care Team:   Rosaria Ferries, PA-C . Jory Sims, DNP, ANP  Any Other Special Instructions Will Be Listed Below (If Applicable).   Studies Ordered:   Orders Placed This Encounter  Procedures  . CBC  . TSH  . Lipid panel  . Comprehensive metabolic panel  . EKG 12-Lead  . ECHOCARDIOGRAM COMPLETE      Glenetta Hew, M.D., M.S. Interventional Cardiologist   Pager # [phone] Phone # [phone] [address] [place], [place] [address]   Thank you for choosing Heart[facility] at [facility]!!

## 2018-04-27 ENCOUNTER — Telehealth: Payer: Self-pay

## 2018-04-27 ENCOUNTER — Other Ambulatory Visit (HOSPITAL_COMMUNITY): Payer: Self-pay | Admitting: Interventional Radiology

## 2018-04-27 ENCOUNTER — Other Ambulatory Visit: Payer: Self-pay

## 2018-04-27 DIAGNOSIS — E875 Hyperkalemia: Secondary | ICD-10-CM

## 2018-04-27 DIAGNOSIS — I25119 Atherosclerotic heart disease of native coronary artery with unspecified angina pectoris: Secondary | ICD-10-CM | POA: Diagnosis not present

## 2018-04-27 DIAGNOSIS — I420 Dilated cardiomyopathy: Secondary | ICD-10-CM | POA: Diagnosis not present

## 2018-04-27 DIAGNOSIS — I25709 Atherosclerosis of coronary artery bypass graft(s), unspecified, with unspecified angina pectoris: Secondary | ICD-10-CM | POA: Diagnosis not present

## 2018-04-27 DIAGNOSIS — I10 Essential (primary) hypertension: Secondary | ICD-10-CM

## 2018-04-27 DIAGNOSIS — D649 Anemia, unspecified: Secondary | ICD-10-CM | POA: Diagnosis not present

## 2018-04-27 DIAGNOSIS — I771 Stricture of artery: Secondary | ICD-10-CM

## 2018-04-27 DIAGNOSIS — R5383 Other fatigue: Secondary | ICD-10-CM | POA: Diagnosis not present

## 2018-04-27 DIAGNOSIS — E1169 Type 2 diabetes mellitus with other specified complication: Secondary | ICD-10-CM | POA: Diagnosis not present

## 2018-04-27 DIAGNOSIS — Z79899 Other long term (current) drug therapy: Secondary | ICD-10-CM

## 2018-04-27 DIAGNOSIS — R3914 Feeling of incomplete bladder emptying: Secondary | ICD-10-CM | POA: Diagnosis not present

## 2018-04-27 DIAGNOSIS — E785 Hyperlipidemia, unspecified: Secondary | ICD-10-CM | POA: Diagnosis not present

## 2018-04-27 LAB — COMPREHENSIVE METABOLIC PANEL
ALT: 8 IU/L (ref 0–44)
AST: 8 IU/L (ref 0–40)
Albumin/Globulin Ratio: 1.9 (ref 1.2–2.2)
Albumin: 4.1 g/dL (ref 3.5–4.7)
Alkaline Phosphatase: 76 IU/L (ref 39–117)
BUN/Creatinine Ratio: 20 (ref 10–24)
BUN: 32 mg/dL — ABNORMAL HIGH (ref 8–27)
Bilirubin Total: 0.2 mg/dL (ref 0.0–1.2)
CO2: 23 mmol/L (ref 20–29)
Calcium: 8.9 mg/dL (ref 8.6–10.2)
Chloride: 101 mmol/L (ref 96–106)
Creatinine, Ser: 1.6 mg/dL — ABNORMAL HIGH (ref 0.76–1.27)
GFR calc Af Amer: 45 mL/min/{1.73_m2} — ABNORMAL LOW (ref >59)
GFR calc non Af Amer: 39 mL/min/{1.73_m2} — ABNORMAL LOW (ref >59)
GLUCOSE: 114 mg/dL — AB (ref 65–99)
Globulin, Total: 2.2 g/dL (ref 1.5–4.5)
Potassium: 5.8 mmol/L (ref 3.5–5.2)
Sodium: 138 mmol/L (ref 134–144)
Total Protein: 6.3 g/dL (ref 6.0–8.5)

## 2018-04-27 LAB — CBC
HEMATOCRIT: 26.7 % — AB (ref 37.5–51.0)
HEMOGLOBIN: 8.8 g/dL — AB (ref 13.0–17.7)
MCH: 28.7 pg (ref 26.6–33.0)
MCHC: 33 g/dL (ref 31.5–35.7)
MCV: 87 fL (ref 79–97)
Platelets: 372 10*3/uL (ref 150–450)
RBC: 3.07 x10E6/uL — ABNORMAL LOW (ref 4.14–5.80)
RDW: 16.3 % — AB (ref 12.3–15.4)
WBC: 5.5 10*3/uL (ref 3.4–10.8)

## 2018-04-27 LAB — TSH: TSH: 1.22 u[IU]/mL (ref 0.450–4.500)

## 2018-04-27 LAB — LIPID PANEL
Chol/HDL Ratio: 3.6 ratio (ref 0.0–5.0)
Cholesterol, Total: 140 mg/dL (ref 100–199)
HDL: 39 mg/dL — ABNORMAL LOW (ref >39)
LDL CALC: 81 mg/dL (ref 0–99)
Triglycerides: 101 mg/dL (ref 0–149)
VLDL CHOLESTEROL CAL: 20 mg/dL (ref 5–40)

## 2018-04-27 NOTE — Telephone Encounter (Signed)
[facility] called K 5.8.Marland Kitchen Per Ginny Forth.[name] with the pt and he says that he feels well... No heart palpitations.. He denies having any GI problems since he was in the office yesterday.. Pt will return tomorrow to have it redrawn.. Will avoid K rich foods such as potatoes. Bananas until he hears back form [place] re: his next results.. Pt denies taking any OTC supplements.Marland Kitchen

## 2018-04-28 ENCOUNTER — Encounter: Payer: Self-pay | Admitting: Cardiology

## 2018-04-28 DIAGNOSIS — I10 Essential (primary) hypertension: Secondary | ICD-10-CM | POA: Diagnosis not present

## 2018-04-28 DIAGNOSIS — Z79899 Other long term (current) drug therapy: Secondary | ICD-10-CM | POA: Diagnosis not present

## 2018-04-28 NOTE — Assessment & Plan Note (Signed)
He had PTCA of in-stent restenosis of his SVG-RCA stents.  Mostly an ostial issue.  Remains on aspirin and Plavix.  After another month, we can probably stop aspirin if necessary for bleeding purposes. Unfortunately with recent PTCA we need to wait 3 months before would be okay to hold Plavix for procedures.  (Would be fine by March 2020).  Is on beta-blocker and calcium channel blocker with Imdur for antianginal effect.  Not on statin currently because of intolerance.

## 2018-04-28 NOTE — Assessment & Plan Note (Signed)
This could be relating somewhat to his fatigue, he did have a drop in hemoglobin to 7 after his last PCI.  We will check another CBC to ensure that is returning back to baseline.  Probably needs be on iron supplementation.

## 2018-04-28 NOTE — Assessment & Plan Note (Signed)
Back on aspirin and Plavix.  Can stop aspirin after January. By March 2020 would be okay to hold Plavix for procedures.

## 2018-04-28 NOTE — Assessment & Plan Note (Signed)
Complicated labile pressures.  He has been up and down.  Now he is on hydralazine which I think we can easily make twice daily since he is only taking it twice daily.  He is taken 100 mg now twice daily.  Would like to see if we can wean off Imdur because of his headache issues. I am having to reduce his carvedilol dose a little bit in the morning because of his fatigue issues hopefully this will help. Continue amlodipine at current dose.  I would allow for mild permissive hypertension but would not want his blood pressure to be over 160s for prolonged period time.  I want him to take an additional PRN hydralazine in the situation.

## 2018-04-28 NOTE — Assessment & Plan Note (Addendum)
Initial stent in the SVG-RCA had in-stent restenosis treated with PTCA and another stent distal.  He now has had in-stent restenosis again ostially.  This was treated with PTCA. Currently on aspirin Plavix, beta-blocker and calcium channel blocker as well as Imdur and hydralazine for regular reduction. Thankfully no recurrent angina post PCI.  He does not like being on 60 mg twice daily Imdur.  I will have him reduce back to 30 mg dosing about a week after he reduces his carvedilol dose.  After about a month we will reassess his symptoms, if not having any angina I would say discontinue Imdur--restart if symptoms recur.

## 2018-04-28 NOTE — Assessment & Plan Note (Signed)
Not sure what the etiology of his fatigue is.  Is thyroid levels being evaluated.  He does have anemia and so we may want to consider iron supplementation. We will recheck hemoglobin today.  Check TSH as well.  Since we have converted him from labetalol to carvedilol, this may have been part of the reason for her symptoms, we will convert morning dose of carvedilol to 3.125 mg and continue nighttime dose at 6.25 mg.

## 2018-04-28 NOTE — Assessment & Plan Note (Addendum)
He has reduced EF on his last echocardiogram that was done at the setting of his most recent "non-STEMI.  I suspect that this may very well have been stress related with his urologic issues.  The chest comfort began shortly after the onset of his bladder related abdominal pain.  The fact that he had anterior wall motion abnormality and anterior EKG changes but with no new findings and anterior ischemia on cath, I suspect this could have been a Takotsubo event.  Plan: Reassess echo in 59months. Continue on carvedilol as well as amlodipine.  Not on ARB because of labile renal function from his urologic issues.  He is on hydralazine/nitrate for afterload reduction.

## 2018-04-28 NOTE — Telephone Encounter (Signed)
SPOKE TO PATIENT , SEE TELEPHONE MESSAGE 04/27/18  PER PATIENT - HE CAME TO OFFICE TODAY TO RECHECK POTASSIUM LEVEL.  RESULT GIVEN   RN INFORMED PATIENT WILL CAL TOMORROW ONCE DR HARDING REVIEWS LABS TO SEE IF  EXTRA DOS OF LASIX IS NEEDED  PATIENT AWARE ALL OTHER LAB RESULTS

## 2018-04-28 NOTE — Assessment & Plan Note (Addendum)
Could be contributing somewhat to his dyspnea.  He is no longer on his ARB but is on hydralazine and Imdur.  On steadily lower dose of carvedilol because of concerns of chronotropic incompetence and fatigue --carvedilol is relatively new for him and he was placed on 12.5 mg twice daily this summer converting from labetalol.  This was then reduced to 6.25 mg twice daily by Rosaria Ferries, PA.. We are using amlodipine for antianginal and blood pressure/afterload reduction. He is on standing dose of furosemide 20 mg daily and I discussed the importance of sliding scale dosing for either increased weight of more than 3 pounds, more edema or more dyspnea with PND, orthopnea.  The new finding from his last hospitalization was that his EF is down.  Nothing really to explain this besides it may be it was a stress-induced cardiomyopathy.  I would like to recheck an echocardiogram in a few months to reassess, because of the reduction in EF was not consistent with the amount of change in his coronary arteries.

## 2018-04-28 NOTE — Telephone Encounter (Signed)
-----   Message from Leonie Man, MD sent at 04/28/2018  1:02 PM EST ----- Lab results: Potassium level seems a little bit high.  Can have this rechecked.  Would recommend taking additional dose of Lasix today and that will help. -Thankfully, kidney function looks better.  Also back to baseline  Hemoglobin level looks much better.  Back to baseline. Thyroid function looks better compared to 4 months ago.  Cholesterol levels look stable. -No change to plan.  Glenetta Hew, MD

## 2018-04-28 NOTE — Assessment & Plan Note (Signed)
Reevaluate renal function -check CMP today.

## 2018-04-28 NOTE — Assessment & Plan Note (Signed)
Way overdue for lipid evaluation.  We will check today.  Had not been on a statin.  Pending results, may want to consider treating in follow-up visits.  If he is reluctant to use statins, would potentially consider Zetia or a more novel agent. Labs drawn today showed LDL of 81.  We can talk in follow-up about treatment options.

## 2018-04-29 ENCOUNTER — Telehealth (HOSPITAL_COMMUNITY): Payer: Self-pay

## 2018-04-29 LAB — BASIC METABOLIC PANEL
BUN/Creatinine Ratio: 21 (ref 10–24)
BUN: 35 mg/dL — ABNORMAL HIGH (ref 8–27)
CO2: 23 mmol/L (ref 20–29)
Calcium: 9.3 mg/dL (ref 8.6–10.2)
Chloride: 102 mmol/L (ref 96–106)
Creatinine, Ser: 1.67 mg/dL — ABNORMAL HIGH (ref 0.76–1.27)
GFR calc Af Amer: 42 mL/min/{1.73_m2} — ABNORMAL LOW (ref >59)
GFR, EST NON AFRICAN AMERICAN: 37 mL/min/{1.73_m2} — AB (ref >59)
Glucose: 176 mg/dL — ABNORMAL HIGH (ref 65–99)
POTASSIUM: 5.6 mmol/L — AB (ref 3.5–5.2)
SODIUM: 140 mmol/L (ref 134–144)

## 2018-04-29 NOTE — Telephone Encounter (Signed)
-----   Message from Leonie Man, MD sent at 04/29/2018  8:03 AM EST ----- Unfortunately, the potassium is still up a little bit.  Better than the previous day.  Was still up. Recommend increasing fluid hydration by 2 to 3 cups of water and take an additional dose of Lasix for the next 2 days.  Recheck on Monday.  Glenetta Hew, MD

## 2018-04-29 NOTE — Telephone Encounter (Signed)
SPOKE TO PATIENT -- ( SEE LABS RESULTS 04/28/18) RESULT GIVEN  AND INSTRUCTIONS  LASIX  X 2 TODAY , INCREASE HYDRATION AND HAVE LABS RECHECKED ON Monday  ORDER PLACED,PATIENT VERBALIZED UNDERSTANDING

## 2018-04-29 NOTE — Telephone Encounter (Signed)
Pt insurance is active and benefits verified through Medicare A/B. Co-pay $0.00, DED $185.00/$185.00 met, out of pocket $0.00/$0.00 met, co-insurance 20%. No pre-authorization required. Passport, 04/29/18 @ 4:20PM, REF# [id number]  2ndary insurance is active and benefits verified through [facility]. Co-pay $0.00, DED $0.00/$0.00 met, out of pocket $0.00/$0.00 met, co-insurance 0%. No pre-authorization required. Passport, 04/29/18 @ 4:21PM, REF# [id number]  Will contact patient to see if he is interested in the Cardiac Rehab Program. If interested, patient will need to complete follow up appt. Once completed, patient will be contacted for scheduling upon review by the RN Navigator.

## 2018-05-02 ENCOUNTER — Ambulatory Visit (HOSPITAL_COMMUNITY): Admission: RE | Admit: 2018-05-02 | Payer: Medicare Other | Source: Ambulatory Visit

## 2018-05-02 ENCOUNTER — Ambulatory Visit (HOSPITAL_COMMUNITY): Payer: Medicare Other

## 2018-05-02 ENCOUNTER — Ambulatory Visit (INDEPENDENT_AMBULATORY_CARE_PROVIDER_SITE_OTHER): Payer: Medicare Other | Admitting: Orthopedic Surgery

## 2018-05-03 NOTE — Telephone Encounter (Signed)
Called pt in regards to CR, pt stated he is not able to start at this time, b/c he takes care of his wife and mother-in-law.   Closed referral

## 2018-05-16 DIAGNOSIS — I2510 Atherosclerotic heart disease of native coronary artery without angina pectoris: Secondary | ICD-10-CM | POA: Diagnosis not present

## 2018-05-16 DIAGNOSIS — E039 Hypothyroidism, unspecified: Secondary | ICD-10-CM | POA: Diagnosis not present

## 2018-05-16 DIAGNOSIS — I10 Essential (primary) hypertension: Secondary | ICD-10-CM | POA: Diagnosis not present

## 2018-05-16 DIAGNOSIS — E7800 Pure hypercholesterolemia, unspecified: Secondary | ICD-10-CM | POA: Diagnosis not present

## 2018-05-16 DIAGNOSIS — N419 Inflammatory disease of prostate, unspecified: Secondary | ICD-10-CM | POA: Diagnosis not present

## 2018-05-16 DIAGNOSIS — Z0000 Encounter for general adult medical examination without abnormal findings: Secondary | ICD-10-CM | POA: Diagnosis not present

## 2018-05-16 DIAGNOSIS — E119 Type 2 diabetes mellitus without complications: Secondary | ICD-10-CM | POA: Diagnosis not present

## 2018-05-21 ENCOUNTER — Encounter (HOSPITAL_COMMUNITY): Payer: Self-pay | Admitting: Emergency Medicine

## 2018-05-21 ENCOUNTER — Emergency Department (HOSPITAL_COMMUNITY)
Admission: EM | Admit: 2018-05-21 | Discharge: 2018-05-21 | Disposition: A | Discharge status: Home / Self Care | Payer: Medicare Other | Attending: Emergency Medicine | Admitting: Emergency Medicine

## 2018-05-21 DIAGNOSIS — Z87891 Personal history of nicotine dependence: Secondary | ICD-10-CM | POA: Insufficient documentation

## 2018-05-21 DIAGNOSIS — Z79899 Other long term (current) drug therapy: Secondary | ICD-10-CM | POA: Diagnosis not present

## 2018-05-21 DIAGNOSIS — N183 Chronic kidney disease, stage 3 (moderate): Secondary | ICD-10-CM | POA: Insufficient documentation

## 2018-05-21 DIAGNOSIS — Z7982 Long term (current) use of aspirin: Secondary | ICD-10-CM | POA: Diagnosis not present

## 2018-05-21 DIAGNOSIS — Z7984 Long term (current) use of oral hypoglycemic drugs: Secondary | ICD-10-CM | POA: Insufficient documentation

## 2018-05-21 DIAGNOSIS — T83090A Other mechanical complication of cystostomy catheter, initial encounter: Secondary | ICD-10-CM

## 2018-05-21 DIAGNOSIS — Z7902 Long term (current) use of antithrombotics/antiplatelets: Secondary | ICD-10-CM | POA: Insufficient documentation

## 2018-05-21 DIAGNOSIS — I5042 Chronic combined systolic (congestive) and diastolic (congestive) heart failure: Secondary | ICD-10-CM | POA: Diagnosis not present

## 2018-05-21 DIAGNOSIS — E1122 Type 2 diabetes mellitus with diabetic chronic kidney disease: Secondary | ICD-10-CM | POA: Insufficient documentation

## 2018-05-21 DIAGNOSIS — R1030 Lower abdominal pain, unspecified: Secondary | ICD-10-CM | POA: Insufficient documentation

## 2018-05-21 DIAGNOSIS — I130 Hypertensive heart and chronic kidney disease with heart failure and stage 1 through stage 4 chronic kidney disease, or unspecified chronic kidney disease: Secondary | ICD-10-CM | POA: Insufficient documentation

## 2018-05-21 DIAGNOSIS — R338 Other retention of urine: Secondary | ICD-10-CM | POA: Insufficient documentation

## 2018-05-21 DIAGNOSIS — Z466 Encounter for fitting and adjustment of urinary device: Secondary | ICD-10-CM | POA: Diagnosis present

## 2018-05-21 NOTE — ED Notes (Signed)
ED Provider at bedside. 

## 2018-05-21 NOTE — ED Provider Notes (Signed)
[facility] EMERGENCY DEPARTMENT Provider Note   CSN: [id number] Arrival date & time: 05/21/18  1045     History   Chief Complaint No chief complaint on file.   HPI [name] is a 85 y.o. male.  HPI Patient presents with leaking around his suprapubic catheter and lower abdominal pain.  Sees Dr. Diona Fanti.  States that last night his catheter is working fine but more pain today.  Has only been draining around with very little going into the bag.  No fevers. Past Medical History:  Diagnosis Date  . Abdominal aortic aneurysm ([place])    a. Aortic duplex 06/2014: mild aneurysmal dilatation of proximal abdominal aorta measuring 3.4x3.4cm. No sig change from 2012. F/u due 06/2016;   . AKI (acute kidney injury) ([place]) 03/28/2016  . Arthritis    "right knee; never bothered me" (03/26/2016)  . 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 CAD in native artery    a. NSTEMI 11/2010 - CABG x2(LIMA to LAD, SVG to PDA). b. NEG Lexi MV 10/24/13, EF 53%, no perfusion abnormality, septal and apical HK noted. c. NSTEMI 05/2014 - s/p DES to SVG-RPDA 06/18/14 (Xience Alpine DES 3.0 x 18 mm -3.35 mm), EF 60-65; d. 03/2016 STEMI/PCI: LM nl, ost LAD 70%, mLCx 50%, pRCA 95% - mRCA 60%, dRPDA 70%, LIMA->LAD ok, o-p SVG->RPDA 100% (3.0x16 Promus DES overlapping prior stent).  . Diastolic dysfunction    a. 03/2016 Echo: EF 60-65%, no rwma, Gr1 DD, triv AI, Ao root 70mm, Asc Ao 31mm, triv MR.  . Dizziness    a. Carotid duplex 03/2014: mild fibrous plaque, no significant stenosis.  . Foley catheter in place    "been wearing it for a couple months now" (03/26/2016)  . HTN (hypertension)   . Hyperlipidemia LDL goal <70   . Non-STEMI (non-ST elevated myocardial infarction) ([place]) 11/2010; 03/2018   1. Ostial LAD 70% (to close to [facility] for PCI), subtotal occlusion of the RCA; 2.  In-stent restenosis SVG-RCA -PTCA.  Marland Kitchen Postoperative atrial fibrillation ([place]) 11/2010   Post  CABG, no sign recurrence  . Refusal of blood transfusions as patient is Jehovah's Witness   . 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 Servando Snare)  . Sleep apnea    Not on CPAP. (03/26/2016)  . ST elevation myocardial infarction (STEMI) of inferior wall ([place]) 04/05/2016   Occluded in-stent restenosis/thrombosis of SVG-RCA --> treated with overlapping Promus DES 3.0 mm 16 mm postdilated 3.0 mm).  . Stroke [facility]) 2014; 01/2015   a. 2014 with mild right hand weakness, nonhemorrhagic per pt.;; b. - PTA-Stent L ICA 95%   . Type II diabetes mellitus [facility])     Patient Active Problem List   Diagnosis Date Noted  . Dilated cardiomyopathy ([place]) 04/26/2018  . Fatigue 04/26/2018  . Chronic combined systolic and diastolic CHF (congestive heart failure) ([place])   . Leukocytosis 04/17/2018  . Chronic diastolic CHF (congestive heart failure) ([place]) 04/17/2018  . History of suprapubic catheter 04/17/2018  . Chronic anemia 04/17/2018  . Chest pain 01/28/2018  . Pseudomonas urinary tract infection   . Hydrocele, right   . Olecranon bursitis of left elbow   . CKD (chronic kidney disease) stage 3, GFR 30-59 ml/min (HCC)   . Suprapubic catheter ([place])   . History of urethral stricture   . Sepsis due to Pseudomonas aeruginosa ([place])   . Acute pyelonephritis   . Sepsis ([place])  12/18/2017  . Hemispheric carotid artery syndrome 12/22/2016  . AKI (acute kidney injury) ([place]) 03/28/2016  . S/P angioplasty with stent 03/26/16 to VG to PDA for in-stent restenosis with DES. 03/28/2016  . History of ST elevation myocardial infarction (STEMI) 03/28/2016  . Carpal tunnel syndrome of right wrist 06/24/2015  . History of stroke 03/21/2015  . Diabetes mellitus treated with oral medication ([place]) 03/21/2015  . Stenosis of left carotid artery   . Essential hypertension   . Hyperlipidemia LDL goal <70   . Cerebral infarction due to stenosis of left middle cerebral artery ([place]) 01/22/2015  . CAD S/P percutaneous  coronary angioplasty 09/19/2014  . Coronary artery disease involving coronary bypass graft of native heart with angina pectoris ([place]) 09/19/2014  . Shortness of breath 09/19/2014  . Polypharmacy 09/19/2014  . Abdominal aortic aneurysm ([place])   . Stroke ([place])   . Hyperlipidemia associated with type 2 diabetes mellitus ([place])   . Type II diabetes mellitus with complication ([place])   . Coronary artery disease involving native coronary artery of native heart with angina pectoris ([place])   . Abnormal TSH 06/19/2014  . Obesity 06/19/2014  . STEMI (ST elevation myocardial infarction) ([place]) 06/18/2014  . NSTEMI (non-ST elevated myocardial infarction) ([place]) 06/17/2014  . Bilateral lower extremity edema 03/30/2014  . Dizziness 02/21/2014  . Bradycardia 08/31/2013  . Postoperative atrial fibrillation (HCC)     Class: Temporary  . Diabetes mellitus type 2 in obese ([place])   . Apnea, sleep   . Balanitis xerotica obliterans   . History of: Non-STEMI (non-ST elevated myocardial infarction) 11/16/2010    Class: History of  . S/P CABG x 2 11/16/2010    Past Surgical History:  Procedure Laterality Date  . APPENDECTOMY    . CARDIAC CATHETERIZATION  12/11/2010   Dr. Chase Picket - subsequent cath - normal LV systolic function, no renal artery stenosis, severe 2-vessel disease with subtotaled RCA prox and distal 60% lesion and complex 70% area of narrowing of ostium of LAD  . CARDIAC CATHETERIZATION N/A 03/26/2016   Procedure: Left Heart Cath and Coronary Angiography;  Surgeon: Nelva Bush, MD;  Location: [facility] CV LAB;  Service: Cardiovascular;  Laterality: N/A;  . CARDIAC CATHETERIZATION N/A 03/26/2016   Procedure: Coronary Stent Intervention;  Surgeon: Nelva Bush, MD;  Location: [facility] CV LAB;  Service: Cardiovascular: 100% In-stent thrombosis of pros SVG-RCA (Xience DES) --> treated with PromusDES 3.0 x 18 (3.3 mm)  . CARDIAC CATHETERIZATION N/A 03/26/2016   Procedure: Bypass Graft  Angiography;  Surgeon: Nelva Bush, MD;  Location: [facility] CV LAB;  Service: Cardiovascular;  Laterality: N/A;  . CORONARY ANGIOPLASTY WITH STENT PLACEMENT  06/18/2014   PCI to SVG-RPDA 06/18/14 (Xience Alpine DES 3.0 x 18 mm -3.35 mm),   . CORONARY ARTERY BYPASS GRAFT  12/15/2010   X2, Dr Servando Snare; LIMA to LAD, SVG to PDA;   . CORONARY BALLOON ANGIOPLASTY N/A 04/18/2018   Procedure: CORONARY BALLOON ANGIOPLASTY;  Surgeon: Leonie Man, MD;  Location: [facility] CV LAB;  Service: Cardiovascular;;; high pressure scoring and noncompliant balloon PTCA of SVG-RCA ISR ostial and proximal  . CORONARY/GRAFT ANGIOGRAPHY N/A 08/27/2017   Procedure: CORONARY/GRAFT ANGIOGRAPHY;  Surgeon: Nelva Bush, MD;  Location: [facility] CV LAB;  Service: Cardiovascular;; pLAD 70%, ostD1 50%.  mCx 60%, OM2 80%. pRCA 95% & mRCA 100% - rPDA 70%. LIMA-mLAD patent. SVG-rPDA 10% ISR.   Marland Kitchen CYSTOSCOPY WITH URETHRAL DILATATION    . IR ANGIO INTRA EXTRACRAN SEL COM  CAROTID INNOMINATE BILAT MOD SED  02/25/2017  . IR ANGIO INTRA EXTRACRAN SEL COM CAROTID INNOMINATE BILAT MOD SED  10/19/2017   Dr. Kathee Delton: L Common Carotid - ECA & major branches widely patent. ICA ~20% distal to bulb & 50% in supraclinoid segment. LMCA-distal 1/3 MI ~905 stenosis with post-stenotic dilation into inferior division. ~50% prox Basilar A stenosis @ anterior Inf Cerebellar A. 50% R ICA  . IR ANGIO VERTEBRAL SEL SUBCLAVIAN INNOMINATE UNI L MOD SED  02/25/2017  . IR ANGIO VERTEBRAL SEL SUBCLAVIAN INNOMINATE UNI R MOD SED  10/19/2017  . IR ANGIO VERTEBRAL SEL VERTEBRAL UNI L MOD SED  10/19/2017  . IR GENERIC HISTORICAL  01/21/2016   IR RADIOLOGIST EVAL & MGMT 01/21/2016 [facility]-INTERV RAD  . IR GENERIC HISTORICAL  02/03/2016   IR CATHETER TUBE CHANGE 02/03/2016 Marybelle Killings, MD [facility]-INTERV RAD  . IR RADIOLOGIST EVAL & MGMT  11/09/2017  . LEFT HEART CATH AND CORONARY ANGIOGRAPHY N/A 04/18/2018   Procedure: LEFT HEART CATH AND CORONARY ANGIOGRAPHY;  Surgeon:  Leonie Man, MD;  Location: [facility] CV LAB;  Service: Cardiovascular;  Laterality: N/A; stable findings on last cath with exception of 75% in-stent restenosis of SVG-RCA ostial stent treated with PTCA.    Marland Kitchen LEFT HEART CATHETERIZATION WITH CORONARY ANGIOGRAM N/A 06/18/2014   Procedure: LEFT HEART CATHETERIZATION WITH CORONARY ANGIOGRAM;  Surgeon: Leonie Man, MD;  Location[facility] CATH LAB;  Service: Cardiovascular;  -- severe disease of SVG-rPDA  . RADIOLOGY WITH ANESTHESIA N/A 01/24/2015   Procedure: STENT ASSISTED ANGIOPLASTY (RADIOLOGY WITH ANESTHESIA);  Surgeon: Luanne Bras, MD;  Location: [facility];  Service: Radiology;  Laterality: N/A;  . TONSILLECTOMY    . TRANSTHORACIC ECHOCARDIOGRAM  08/2017; 04/2018:    A) EF 60-65%. Mild LVH. No RWMA. Gr 1 DD. Mod LA dilation.;  B)  EF 35-40%.  Severe LVH.  GRII DD.  Apical anteroseptal hypokinesis.  Akinesis of the apex.        Home Medications    Prior to Admission medications   Medication Sig Start Date End Date Taking? Authorizing Provider  amLODipine (NORVASC) 10 MG tablet Take 10 mg by mouth every evening.     [provider]  aspirin 81 MG chewable tablet Chew 1 tablet (81 mg total) by mouth daily. 03/29/16   Isaiah Serge, NP  carvedilol (COREG) 6.25 MG tablet Take 0.5 tablets (3.125 mg total) by mouth every morning AND 1 tablet (6.25 mg total) every evening. 04/26/18   Leonie Man, MD  clopidogrel (PLAVIX) 75 MG tablet Take 1 tablet (75 mg total) by mouth daily. 09/22/17   Croitoru, Mihai, MD  doxazosin (CARDURA) 1 MG tablet Take 1 tablet (1 mg total) by mouth daily. 04/21/18   Dessa Phi, DO  furosemide (LASIX) 20 MG tablet Take 1 tablet (20 mg total) by mouth daily. 04/21/18   Dessa Phi, DO  glimepiride (AMARYL) 4 MG tablet Take 4 mg by mouth 2 (two) times daily with a meal.     [provider]  hydrALAZINE (APRESOLINE) 100 MG tablet Take 1 tablet (100 mg total) by mouth 2 (two) times daily.  DISCONTINUE PERVIOUS DOSE  PATIENT WILL CALL WHEN REFILL IS NEEDED 04/26/18   Leonie Man, MD  isosorbide mononitrate (IMDUR) 60 MG 24 hr tablet Take 1 tablet (60 mg total) by mouth 2 (two) times daily. 01/28/18   Geradine Girt, DO  metFORMIN (GLUCOPHAGE) 1000 MG tablet Take 1 tablet (1,000 mg total) by mouth 2 (  two) times daily. 03/29/16   Isaiah Serge, NP  nitroGLYCERIN (NITROSTAT) 0.4 MG SL tablet Place 1 tablet (0.4 mg total) under the tongue every 5 (five) minutes as needed for chest pain (up to 3 doses). 08/25/17   Almyra Deforest, PA  oxybutynin (DITROPAN) 5 MG tablet Take 5 mg by mouth daily as needed for bladder spasms.  09/07/17   [provider]    Family History Family History  Problem Relation Age of Onset  . Heart Problems Father 9    Social History Social History   Tobacco Use  . Smoking status: Former Smoker    Packs/day: 0.33    Years: 10.00    Pack years: 3.30    Types: Cigarettes    Last attempt to quit: 1961    Years since quitting: 59.0  . Smokeless tobacco: Never Used  Substance Use Topics  . Alcohol use: No  . Drug use: No     Allergies   Other; Statins; Atorvastatin; Crestor [rosuvastatin]; Pravastatin; and Simvastatin   Review of Systems Review of Systems  Constitutional: Negative for appetite change.  HENT: Negative for congestion.   Respiratory: Negative for shortness of breath.   Cardiovascular: Negative for chest pain.  Gastrointestinal: Positive for abdominal pain and nausea. Negative for vomiting.  Genitourinary: Positive for difficulty urinating. Negative for discharge and testicular pain.  Musculoskeletal: Negative for back pain.  Neurological: Negative for weakness.  Psychiatric/Behavioral: Negative for confusion.     Physical Exam Updated Vital Signs BP (!) 194/91 (BP Location: Right Arm)   Pulse 76   Temp 97.8 F (36.6 C) (Oral)   Resp 15   SpO2 97%   Physical Exam HENT:     Head: Normocephalic.  Neck:      Musculoskeletal: Neck supple.  Cardiovascular:     Rate and Rhythm: Normal rate.  Pulmonary:     Effort: Pulmonary effort is normal.  Abdominal:     Palpations: There is mass.     Tenderness: There is abdominal tenderness.  Genitourinary:    Comments: Suprapubic catheter in place.  Suprapubic mass/fullness.  No urethral meatus seen. Skin:    General: Skin is warm.     Capillary Refill: Capillary refill takes less than 2 seconds.  Neurological:     General: No focal deficit present.     Mental Status: He is alert.      ED Treatments / Results  Labs (all labs ordered are listed, but only abnormal results are displayed) Labs Reviewed - No data to display  EKG None  Radiology No results found.  Procedures BLADDER CATHETERIZATION Date/Time: 05/21/2018 12:55 PM Performed by: Davonna Belling, MD Authorized by: Davonna Belling, MD   Consent:    Consent obtained:  Verbal   Consent given by:  Patient   Risks discussed:  False passage, infection and incomplete procedure   Alternatives discussed:  No treatment Pre-procedure details:    Procedure purpose:  Therapeutic Anesthesia (see MAR for exact dosages):    Anesthesia method:  None Procedure details:    Procedure performed by provider due to: suprapubic.   Catheter insertion:  Indwelling   Catheter type:  Foley   Catheter size:  20 Fr   Bladder irrigation: no     Number of attempts:  1   Urine characteristics:  Clear Post-procedure details:    Patient tolerance of procedure:  Tolerated well, no immediate complications   (including critical care time)  Medications Ordered in ED Medications - No data to  display   Initial Impression / Assessment and Plan / ED Course  I have reviewed the triage vital signs and the nursing notes.  Pertinent labs & imaging results that were available during my care of the patient were reviewed by me and considered in my medical decision making (see chart for details).      Patient with suprapubic catheter.  Appears to be obstructed.  Replaced by me.  Patient feels much better.  Discharge home to follow-up with outpatient urology.  Final Clinical Impressions(s) / ED Diagnoses   Final diagnoses:  Blocked suprapubic catheter, initial encounter [facility])    ED Discharge Orders    None       Davonna Belling, MD 05/21/18 1256

## 2018-05-21 NOTE — ED Triage Notes (Signed)
Pt coming from home, complains of pain at his foley catheter site. En route, pt noticed his foley leaking. Pt denies bleeding. BP 172/102, CBG 174, HR 87, 98% on room air.

## 2018-05-21 NOTE — ED Notes (Signed)
Patient verbalizes understanding of discharge instructions. Opportunity for questioning and answers were provided. Armband removed by staff, pt discharged from ED. Family to take pot home. Leg bag switched out. Extra leg strap sent home with pt.

## 2018-05-21 NOTE — ED Notes (Signed)
Suprapubic foley changed out by Alvino Chapel, MD. New leg bag provided for home use. Pt foley draining appropriately at this time.

## 2018-05-21 NOTE — ED Notes (Signed)
20 french foley ordered from materials. EDP notified.

## 2018-05-23 ENCOUNTER — Ambulatory Visit (HOSPITAL_COMMUNITY): Admission: RE | Admit: 2018-05-23 | Payer: Medicare Other | Source: Ambulatory Visit

## 2018-05-23 ENCOUNTER — Ambulatory Visit (HOSPITAL_COMMUNITY): Payer: Medicare Other

## 2018-05-24 ENCOUNTER — Emergency Department (HOSPITAL_COMMUNITY): Payer: Medicare Other

## 2018-05-24 ENCOUNTER — Encounter (HOSPITAL_COMMUNITY): Payer: Self-pay | Admitting: Emergency Medicine

## 2018-05-24 ENCOUNTER — Inpatient Hospital Stay (HOSPITAL_COMMUNITY)
Admission: EM | Admit: 2018-05-24 | Discharge: 2018-05-27 | DRG: 065 | Disposition: A | Discharge status: Rehabilitation Facility | Payer: Medicare Other | Attending: Internal Medicine | Admitting: Internal Medicine

## 2018-05-24 ENCOUNTER — Observation Stay (HOSPITAL_COMMUNITY): Payer: Medicare Other

## 2018-05-24 DIAGNOSIS — Z9359 Other cystostomy status: Secondary | ICD-10-CM

## 2018-05-24 DIAGNOSIS — E1122 Type 2 diabetes mellitus with diabetic chronic kidney disease: Secondary | ICD-10-CM | POA: Diagnosis present

## 2018-05-24 DIAGNOSIS — I10 Essential (primary) hypertension: Secondary | ICD-10-CM | POA: Diagnosis present

## 2018-05-24 DIAGNOSIS — Z888 Allergy status to other drugs, medicaments and biological substances status: Secondary | ICD-10-CM

## 2018-05-24 DIAGNOSIS — R531 Weakness: Secondary | ICD-10-CM | POA: Diagnosis not present

## 2018-05-24 DIAGNOSIS — I639 Cerebral infarction, unspecified: Secondary | ICD-10-CM

## 2018-05-24 DIAGNOSIS — E1142 Type 2 diabetes mellitus with diabetic polyneuropathy: Secondary | ICD-10-CM | POA: Diagnosis present

## 2018-05-24 DIAGNOSIS — E559 Vitamin D deficiency, unspecified: Secondary | ICD-10-CM | POA: Diagnosis present

## 2018-05-24 DIAGNOSIS — I63512 Cerebral infarction due to unspecified occlusion or stenosis of left middle cerebral artery: Secondary | ICD-10-CM | POA: Diagnosis not present

## 2018-05-24 DIAGNOSIS — I69351 Hemiplegia and hemiparesis following cerebral infarction affecting right dominant side: Secondary | ICD-10-CM

## 2018-05-24 DIAGNOSIS — I5022 Chronic systolic (congestive) heart failure: Secondary | ICD-10-CM | POA: Diagnosis present

## 2018-05-24 DIAGNOSIS — I252 Old myocardial infarction: Secondary | ICD-10-CM

## 2018-05-24 DIAGNOSIS — I420 Dilated cardiomyopathy: Secondary | ICD-10-CM | POA: Diagnosis present

## 2018-05-24 DIAGNOSIS — I6522 Occlusion and stenosis of left carotid artery: Secondary | ICD-10-CM | POA: Diagnosis present

## 2018-05-24 DIAGNOSIS — E785 Hyperlipidemia, unspecified: Secondary | ICD-10-CM | POA: Diagnosis present

## 2018-05-24 DIAGNOSIS — I1A0 Resistant hypertension: Secondary | ICD-10-CM | POA: Diagnosis present

## 2018-05-24 DIAGNOSIS — E11649 Type 2 diabetes mellitus with hypoglycemia without coma: Secondary | ICD-10-CM | POA: Diagnosis not present

## 2018-05-24 DIAGNOSIS — R29810 Facial weakness: Secondary | ICD-10-CM | POA: Diagnosis present

## 2018-05-24 DIAGNOSIS — N183 Chronic kidney disease, stage 3 unspecified: Secondary | ICD-10-CM | POA: Diagnosis present

## 2018-05-24 DIAGNOSIS — G4733 Obstructive sleep apnea (adult) (pediatric): Secondary | ICD-10-CM | POA: Diagnosis present

## 2018-05-24 DIAGNOSIS — R29898 Other symptoms and signs involving the musculoskeletal system: Secondary | ICD-10-CM | POA: Diagnosis present

## 2018-05-24 DIAGNOSIS — Z87891 Personal history of nicotine dependence: Secondary | ICD-10-CM

## 2018-05-24 DIAGNOSIS — Z7982 Long term (current) use of aspirin: Secondary | ICD-10-CM

## 2018-05-24 DIAGNOSIS — Z7902 Long term (current) use of antithrombotics/antiplatelets: Secondary | ICD-10-CM

## 2018-05-24 DIAGNOSIS — N390 Urinary tract infection, site not specified: Secondary | ICD-10-CM | POA: Diagnosis present

## 2018-05-24 DIAGNOSIS — I2510 Atherosclerotic heart disease of native coronary artery without angina pectoris: Secondary | ICD-10-CM | POA: Diagnosis present

## 2018-05-24 DIAGNOSIS — R29705 NIHSS score 5: Secondary | ICD-10-CM | POA: Diagnosis present

## 2018-05-24 DIAGNOSIS — I130 Hypertensive heart and chronic kidney disease with heart failure and stage 1 through stage 4 chronic kidney disease, or unspecified chronic kidney disease: Secondary | ICD-10-CM | POA: Diagnosis present

## 2018-05-24 DIAGNOSIS — Z8673 Personal history of transient ischemic attack (TIA), and cerebral infarction without residual deficits: Secondary | ICD-10-CM

## 2018-05-24 DIAGNOSIS — R4781 Slurred speech: Secondary | ICD-10-CM | POA: Diagnosis present

## 2018-05-24 DIAGNOSIS — I6930 Unspecified sequelae of cerebral infarction: Secondary | ICD-10-CM

## 2018-05-24 DIAGNOSIS — I714 Abdominal aortic aneurysm, without rupture: Secondary | ICD-10-CM | POA: Diagnosis present

## 2018-05-24 DIAGNOSIS — Z79899 Other long term (current) drug therapy: Secondary | ICD-10-CM

## 2018-05-24 DIAGNOSIS — Z95820 Peripheral vascular angioplasty status with implants and grafts: Secondary | ICD-10-CM

## 2018-05-24 DIAGNOSIS — Z7984 Long term (current) use of oral hypoglycemic drugs: Secondary | ICD-10-CM

## 2018-05-24 DIAGNOSIS — Z955 Presence of coronary angioplasty implant and graft: Secondary | ICD-10-CM

## 2018-05-24 DIAGNOSIS — Z951 Presence of aortocoronary bypass graft: Secondary | ICD-10-CM

## 2018-05-24 DIAGNOSIS — D62 Acute posthemorrhagic anemia: Secondary | ICD-10-CM

## 2018-05-24 DIAGNOSIS — I5042 Chronic combined systolic (congestive) and diastolic (congestive) heart failure: Secondary | ICD-10-CM | POA: Diagnosis present

## 2018-05-24 HISTORY — DX: Other specified health status: Z78.9

## 2018-05-24 LAB — DIFFERENTIAL
ABS IMMATURE GRANULOCYTES: 0.17 10*3/uL — AB (ref 0.00–0.07)
Basophils Absolute: 0 10*3/uL (ref 0.0–0.1)
Basophils Relative: 1 %
Eosinophils Absolute: 0.1 10*3/uL (ref 0.0–0.5)
Eosinophils Relative: 2 %
Immature Granulocytes: 3 %
LYMPHS PCT: 28 %
Lymphs Abs: 1.5 10*3/uL (ref 0.7–4.0)
Monocytes Absolute: 0.9 10*3/uL (ref 0.1–1.0)
Monocytes Relative: 18 %
NEUTROS ABS: 2.6 10*3/uL (ref 1.7–7.7)
Neutrophils Relative %: 48 %

## 2018-05-24 LAB — COMPREHENSIVE METABOLIC PANEL
ALK PHOS: 55 U/L (ref 38–126)
ALT: 9 U/L (ref 0–44)
AST: 15 U/L (ref 15–41)
Albumin: 3.4 g/dL — ABNORMAL LOW (ref 3.5–5.0)
Anion gap: 8 (ref 5–15)
BUN: 17 mg/dL (ref 8–23)
CO2: 23 mmol/L (ref 22–32)
Calcium: 8.9 mg/dL (ref 8.9–10.3)
Chloride: 109 mmol/L (ref 98–111)
Creatinine, Ser: 1.41 mg/dL — ABNORMAL HIGH (ref 0.61–1.24)
GFR calc Af Amer: 52 mL/min — ABNORMAL LOW (ref >60)
GFR calc non Af Amer: 45 mL/min — ABNORMAL LOW (ref >60)
Glucose, Bld: 67 mg/dL — ABNORMAL LOW (ref 70–99)
Potassium: 4 mmol/L (ref 3.5–5.1)
Sodium: 140 mmol/L (ref 135–145)
Total Bilirubin: 0.5 mg/dL (ref 0.3–1.2)
Total Protein: 6.6 g/dL (ref 6.5–8.1)

## 2018-05-24 LAB — URINALYSIS, ROUTINE W REFLEX MICROSCOPIC
Bilirubin Urine: NEGATIVE
GLUCOSE, UA: NEGATIVE mg/dL
Hgb urine dipstick: NEGATIVE
Ketones, ur: NEGATIVE mg/dL
Nitrite: NEGATIVE
Protein, ur: 100 mg/dL — AB
Specific Gravity, Urine: 1.012 (ref 1.005–1.030)
pH: 7 (ref 5.0–8.0)

## 2018-05-24 LAB — I-STAT CHEM 8, ED
BUN: 19 mg/dL (ref 8–23)
CREATININE: 1.4 mg/dL — AB (ref 0.61–1.24)
Calcium, Ion: 1.19 mmol/L (ref 1.15–1.40)
Chloride: 107 mmol/L (ref 98–111)
Glucose, Bld: 64 mg/dL — ABNORMAL LOW (ref 70–99)
HCT: 27 % — ABNORMAL LOW (ref 39.0–52.0)
HEMOGLOBIN: 9.2 g/dL — AB (ref 13.0–17.0)
Potassium: 4 mmol/L (ref 3.5–5.1)
Sodium: 141 mmol/L (ref 135–145)
TCO2: 24 mmol/L (ref 22–32)

## 2018-05-24 LAB — CBG MONITORING, ED
Glucose-Capillary: 59 mg/dL — ABNORMAL LOW (ref 70–99)
Glucose-Capillary: 97 mg/dL (ref 70–99)
Glucose-Capillary: 52 mg/dL — ABNORMAL LOW (ref 70–99)

## 2018-05-24 LAB — GLUCOSE, CAPILLARY
Glucose-Capillary: 96 mg/dL (ref 70–99)
Glucose-Capillary: 88 mg/dL (ref 70–99)

## 2018-05-24 LAB — CBC
HCT: 28.1 % — ABNORMAL LOW (ref 39.0–52.0)
Hemoglobin: 8.4 g/dL — ABNORMAL LOW (ref 13.0–17.0)
MCH: 26.7 pg (ref 26.0–34.0)
MCHC: 29.9 g/dL — ABNORMAL LOW (ref 30.0–36.0)
MCV: 89.2 fL (ref 80.0–100.0)
Platelets: 229 10*3/uL (ref 150–400)
RBC: 3.15 MIL/uL — AB (ref 4.22–5.81)
RDW: 18.5 % — ABNORMAL HIGH (ref 11.5–15.5)
WBC: 5.3 10*3/uL (ref 4.0–10.5)
nRBC: 0 % (ref 0.0–0.2)

## 2018-05-24 LAB — RAPID URINE DRUG SCREEN, HOSP PERFORMED
Amphetamines: NOT DETECTED
Barbiturates: NOT DETECTED
Benzodiazepines: NOT DETECTED
Cocaine: NOT DETECTED
Opiates: NOT DETECTED
Tetrahydrocannabinol: NOT DETECTED

## 2018-05-24 LAB — PROTIME-INR
INR: 1.11
Prothrombin Time: 14.3 seconds (ref 11.4–15.2)

## 2018-05-24 LAB — ETHANOL: Alcohol, Ethyl (B): <10 mg/dL (ref <10)

## 2018-05-24 LAB — APTT: aPTT: 32 seconds (ref 24–36)

## 2018-05-24 MED ORDER — CLOPIDOGREL BISULFATE 75 MG PO TABS
75.0000 mg | ORAL_TABLET | Freq: Every day | ORAL | Status: DC
  Administered 2018-05-25 – 2018-05-27 (×3): 75 mg via ORAL
  Filled 2018-05-24 (×3): qty 1

## 2018-05-24 MED ORDER — DEXTROSE 50 % IV SOLN
INTRAVENOUS | Status: AC
  Administered 2018-05-24: 17:00:00
  Filled 2018-05-24: qty 50

## 2018-05-24 MED ORDER — DEXTROSE 50 % IV SOLN
50.0000 mL | Freq: Once | INTRAVENOUS | Status: AC
  Administered 2018-05-24: 14:00:00 via INTRAVENOUS

## 2018-05-24 MED ORDER — ACETAMINOPHEN 650 MG RE SUPP: 650.0000 mg | Freq: Four times a day (QID) | RECTAL | Status: DC | PRN

## 2018-05-24 MED ORDER — ENOXAPARIN SODIUM 40 MG/0.4ML ~~LOC~~ SOLN
40.0000 mg | SUBCUTANEOUS | Status: DC
  Administered 2018-05-24 – 2018-05-26 (×3): 40 mg via SUBCUTANEOUS
  Filled 2018-05-24 (×4): qty 0.4

## 2018-05-24 MED ORDER — DEXTROSE 50 % IV SOLN
INTRAVENOUS | Status: AC
  Filled 2018-05-24: qty 50

## 2018-05-24 MED ORDER — INSULIN ASPART 100 UNIT/ML ~~LOC~~ SOLN
0.0000 [IU] | Freq: Every day | SUBCUTANEOUS | Status: DC
  Administered 2018-05-25: 3 [IU] via SUBCUTANEOUS
  Administered 2018-05-26: 2 [IU] via SUBCUTANEOUS

## 2018-05-24 MED ORDER — CIPROFLOXACIN HCL 500 MG PO TABS
500.0000 mg | ORAL_TABLET | Freq: Two times a day (BID) | ORAL | Status: DC
  Administered 2018-05-24 – 2018-05-27 (×6): 500 mg via ORAL
  Filled 2018-05-24 (×6): qty 1

## 2018-05-24 MED ORDER — ACETAMINOPHEN 325 MG PO TABS
650.0000 mg | ORAL_TABLET | Freq: Four times a day (QID) | ORAL | Status: DC | PRN
  Administered 2018-05-26: 650 mg via ORAL
  Filled 2018-05-24: qty 2

## 2018-05-24 MED ORDER — FERROUS SULFATE DRIED 200 (65 FE) MG PO TABS: 1.0000 | ORAL_TABLET | Freq: Two times a day (BID) | ORAL | Status: DC

## 2018-05-24 MED ORDER — SODIUM CHLORIDE 0.9 % IV BOLUS
500.0000 mL | Freq: Once | INTRAVENOUS | Status: AC
  Administered 2018-05-24: 500 mL via INTRAVENOUS

## 2018-05-24 MED ORDER — FERROUS SULFATE 325 (65 FE) MG PO TABS
325.0000 mg | ORAL_TABLET | Freq: Two times a day (BID) | ORAL | Status: DC
  Administered 2018-05-24 – 2018-05-27 (×5): 325 mg via ORAL
  Filled 2018-05-24 (×6): qty 1

## 2018-05-24 MED ORDER — IOPAMIDOL (ISOVUE-370) INJECTION 76%
100.0000 mL | Freq: Once | INTRAVENOUS | Status: AC | PRN
  Administered 2018-05-24: 100 mL via INTRAVENOUS

## 2018-05-24 MED ORDER — ASPIRIN 81 MG PO CHEW
81.0000 mg | CHEWABLE_TABLET | Freq: Every day | ORAL | Status: DC
  Administered 2018-05-24 – 2018-05-27 (×4): 81 mg via ORAL
  Filled 2018-05-24 (×4): qty 1

## 2018-05-24 MED ORDER — CLOPIDOGREL BISULFATE 75 MG PO TABS: 75.0000 mg | ORAL_TABLET | Freq: Every day | ORAL | Status: DC

## 2018-05-24 MED ORDER — INSULIN ASPART 100 UNIT/ML ~~LOC~~ SOLN
0.0000 [IU] | Freq: Three times a day (TID) | SUBCUTANEOUS | Status: DC
  Administered 2018-05-25: 3 [IU] via SUBCUTANEOUS
  Administered 2018-05-25: 2 [IU] via SUBCUTANEOUS

## 2018-05-24 NOTE — Progress Notes (Signed)
Catherter care provided upon admission to unit

## 2018-05-24 NOTE — ED Provider Notes (Signed)
[facility] EMERGENCY DEPARTMENT Provider Note   CSN: [id number] Arrival date & time: 05/24/18  1357     History   Chief Complaint Chief Complaint  Patient presents with  . Code Stroke    HPI [name] is a 85 y.o. male.  HPI  85 year old male presents as an acute code stroke.  The patient was noted by family to have a right facial droop around 1230 or 1 PM.  Previously was supposedly normal though after the patient has presented more history is taken from patient and family and he is not been feeling well for a couple days.  Went to the hospital a few days ago and had his Foley catheter changed.  He is been having some subjective fever and chills since and went to his PCP yesterday and was diagnosed with UTI and put on Cipro.  Early this morning around 3 AM he tells me that he had difficulty using his right hand to twist off the cap to his catheter bag.  He has chronic weakness from a prior stroke in this hand but it was much more significant than normal.  Past Medical History:  Diagnosis Date  . Abdominal aortic aneurysm ([place])    a. Aortic duplex 06/2014: mild aneurysmal dilatation of proximal abdominal aorta measuring 3.4x3.4cm. No sig change from 2012. F/u due 06/2016;   . AKI (acute kidney injury) ([place]) 03/28/2016  . Arthritis    "right knee; never bothered me" (03/26/2016)  . 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 CAD in native artery    a. NSTEMI 11/2010 - CABG x2(LIMA to LAD, SVG to PDA). b. NEG Lexi MV 10/24/13, EF 53%, no perfusion abnormality, septal and apical HK noted. c. NSTEMI 05/2014 - s/p DES to SVG-RPDA 06/18/14 (Xience Alpine DES 3.0 x 18 mm -3.35 mm), EF 60-65; d. 03/2016 STEMI/PCI: LM nl, ost LAD 70%, mLCx 50%, pRCA 95% - mRCA 60%, dRPDA 70%, LIMA->LAD ok, o-p SVG->RPDA 100% (3.0x16 Promus DES overlapping prior stent).  . Diastolic dysfunction    a. 03/2016 Echo: EF 60-65%, no rwma, Gr1 DD, triv AI, Ao root  79mm, Asc Ao 14mm, triv MR.  . Dizziness    a. Carotid duplex 03/2014: mild fibrous plaque, no significant stenosis.  . Foley catheter in place    "been wearing it for a couple months now" (03/26/2016)  . HTN (hypertension)   . Hyperlipidemia LDL goal <70   . Non-STEMI (non-ST elevated myocardial infarction) ([place]) 11/2010; 03/2018   1. Ostial LAD 70% (to close to [facility] for PCI), subtotal occlusion of the RCA; 2.  In-stent restenosis SVG-RCA -PTCA.  Marland Kitchen Postoperative atrial fibrillation ([place]) 11/2010   Post CABG, no sign recurrence  . Refusal of blood transfusions as patient is Jehovah's Witness   . 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 Servando Snare)  . Sleep apnea    Not on CPAP. (03/26/2016)  . ST elevation myocardial infarction (STEMI) of inferior wall ([place]) 04/05/2016   Occluded in-stent restenosis/thrombosis of SVG-RCA --> treated with overlapping Promus DES 3.0 mm 16 mm postdilated 3.0 mm).  . Stroke [facility]) 2014; 01/2015   a. 2014 with mild right hand weakness, nonhemorrhagic per pt.;; b. - PTA-Stent L ICA 95%   . Type II diabetes mellitus [facility])     Patient Active Problem List   Diagnosis Date Noted  . Dilated cardiomyopathy ([place]) 04/26/2018  . Fatigue 04/26/2018  .  Chronic combined systolic and diastolic CHF (congestive heart failure) ([place])   . Leukocytosis 04/17/2018  . Chronic diastolic CHF (congestive heart failure) ([place]) 04/17/2018  . History of suprapubic catheter 04/17/2018  . Chronic anemia 04/17/2018  . Chest pain 01/28/2018  . Pseudomonas urinary tract infection   . Hydrocele, right   . Olecranon bursitis of left elbow   . CKD (chronic kidney disease) stage 3, GFR 30-59 ml/min (HCC)   . Suprapubic catheter ([place])   . History of urethral stricture   . Sepsis due to Pseudomonas aeruginosa ([place])   . Acute pyelonephritis   . Sepsis ([place]) 12/18/2017  . Hemispheric carotid artery syndrome 12/22/2016  . AKI (acute kidney injury) ([place]) 03/28/2016  . S/P angioplasty  with stent 03/26/16 to VG to PDA for in-stent restenosis with DES. 03/28/2016  . History of ST elevation myocardial infarction (STEMI) 03/28/2016  . Carpal tunnel syndrome of right wrist 06/24/2015  . History of stroke 03/21/2015  . Diabetes mellitus treated with oral medication ([place]) 03/21/2015  . Stenosis of left carotid artery   . Essential hypertension   . Hyperlipidemia LDL goal <70   . Cerebral infarction due to stenosis of left middle cerebral artery ([place]) 01/22/2015  . CAD S/P percutaneous coronary angioplasty 09/19/2014  . Coronary artery disease involving coronary bypass graft of native heart with angina pectoris ([place]) 09/19/2014  . Shortness of breath 09/19/2014  . Polypharmacy 09/19/2014  . Abdominal aortic aneurysm ([place])   . Stroke ([place])   . Hyperlipidemia associated with type 2 diabetes mellitus ([place])   . Type II diabetes mellitus with complication ([place])   . Coronary artery disease involving native coronary artery of native heart with angina pectoris ([place])   . Abnormal TSH 06/19/2014  . Obesity 06/19/2014  . STEMI (ST elevation myocardial infarction) ([place]) 06/18/2014  . NSTEMI (non-ST elevated myocardial infarction) ([place]) 06/17/2014  . Bilateral lower extremity edema 03/30/2014  . Dizziness 02/21/2014  . Bradycardia 08/31/2013  . Postoperative atrial fibrillation (HCC)     Class: Temporary  . Diabetes mellitus type 2 in obese ([place])   . Apnea, sleep   . Balanitis xerotica obliterans   . History of: Non-STEMI (non-ST elevated myocardial infarction) 11/16/2010    Class: History of  . S/P CABG x 2 11/16/2010    Past Surgical History:  Procedure Laterality Date  . APPENDECTOMY    . CARDIAC CATHETERIZATION  12/11/2010   Dr. Chase Picket - subsequent cath - normal LV systolic function, no renal artery stenosis, severe 2-vessel disease with subtotaled RCA prox and distal 60% lesion and complex 70% area of narrowing of ostium of LAD  . CARDIAC CATHETERIZATION N/A 03/26/2016    Procedure: Left Heart Cath and Coronary Angiography;  Surgeon: Nelva Bush, MD;  Location: [facility] CV LAB;  Service: Cardiovascular;  Laterality: N/A;  . CARDIAC CATHETERIZATION N/A 03/26/2016   Procedure: Coronary Stent Intervention;  Surgeon: Nelva Bush, MD;  Location: [facility] CV LAB;  Service: Cardiovascular: 100% In-stent thrombosis of pros SVG-RCA (Xience DES) --> treated with PromusDES 3.0 x 18 (3.3 mm)  . CARDIAC CATHETERIZATION N/A 03/26/2016   Procedure: Bypass Graft Angiography;  Surgeon: Nelva Bush, MD;  Location: [facility] CV LAB;  Service: Cardiovascular;  Laterality: N/A;  . CORONARY ANGIOPLASTY WITH STENT PLACEMENT  06/18/2014   PCI to SVG-RPDA 06/18/14 (Xience Alpine DES 3.0 x 18 mm -3.35 mm),   . CORONARY ARTERY BYPASS GRAFT  12/15/2010   X2, Dr Servando Snare; LIMA to LAD, SVG to PDA;   .  CORONARY BALLOON ANGIOPLASTY N/A 04/18/2018   Procedure: CORONARY BALLOON ANGIOPLASTY;  Surgeon: Leonie Man, MD;  Location: [facility] CV LAB;  Service: Cardiovascular;;; high pressure scoring and noncompliant balloon PTCA of SVG-RCA ISR ostial and proximal  . CORONARY/GRAFT ANGIOGRAPHY N/A 08/27/2017   Procedure: CORONARY/GRAFT ANGIOGRAPHY;  Surgeon: Nelva Bush, MD;  Location: [facility];  Service: Cardiovascular;; pLAD 70%, ostD1 50%.  mCx 60%, OM2 80%. pRCA 95% & mRCA 100% - rPDA 70%. LIMA-mLAD patent. SVG-rPDA 10% ISR.   Marland Kitchen CYSTOSCOPY WITH URETHRAL DILATATION    . IR ANGIO INTRA EXTRACRAN SEL COM CAROTID INNOMINATE BILAT MOD SED  02/25/2017  . IR ANGIO INTRA EXTRACRAN SEL COM CAROTID INNOMINATE BILAT MOD SED  10/19/2017   Dr. Kathee Delton: L Common Carotid - ECA & major branches widely patent. ICA ~20% distal to bulb & 50% in supraclinoid segment. LMCA-distal 1/3 MI ~905 stenosis with post-stenotic dilation into inferior division. ~50% prox Basilar A stenosis @ anterior Inf Cerebellar A. 50% R ICA  . IR ANGIO VERTEBRAL SEL SUBCLAVIAN INNOMINATE UNI L MOD SED   02/25/2017  . IR ANGIO VERTEBRAL SEL SUBCLAVIAN INNOMINATE UNI R MOD SED  10/19/2017  . IR ANGIO VERTEBRAL SEL VERTEBRAL UNI L MOD SED  10/19/2017  . IR GENERIC HISTORICAL  01/21/2016   IR RADIOLOGIST EVAL & MGMT 01/21/2016 [facility]-INTERV RAD  . IR GENERIC HISTORICAL  02/03/2016   IR CATHETER TUBE CHANGE 02/03/2016 Marybelle Killings, MD [facility]-INTERV RAD  . IR RADIOLOGIST EVAL & MGMT  11/09/2017  . LEFT HEART CATH AND CORONARY ANGIOGRAPHY N/A 04/18/2018   Procedure: LEFT HEART CATH AND CORONARY ANGIOGRAPHY;  Surgeon: Leonie Man, MD;  Location: [facility] LAB;  Service: Cardiovascular;  Laterality: N/A; stable findings on last cath with exception of 75% in-stent restenosis of SVG-RCA ostial stent treated with PTCA.    Marland Kitchen LEFT HEART CATHETERIZATION WITH CORONARY ANGIOGRAM N/A 06/18/2014   Procedure: LEFT HEART CATHETERIZATION WITH CORONARY ANGIOGRAM;  Surgeon: Leonie Man, MD;  Location: [facility] CATH LAB;  Service: Cardiovascular;  -- severe disease of SVG-rPDA  . RADIOLOGY WITH ANESTHESIA N/A 01/24/2015   Procedure: STENT ASSISTED ANGIOPLASTY (RADIOLOGY WITH ANESTHESIA);  Surgeon: Luanne Bras, MD;  Location: [facility];  Service: Radiology;  Laterality: N/A;  . TONSILLECTOMY    . TRANSTHORACIC ECHOCARDIOGRAM  08/2017; 04/2018:    A) EF 60-65%. Mild LVH. No RWMA. Gr 1 DD. Mod LA dilation.;  B)  EF 35-40%.  Severe LVH.  GRII DD.  Apical anteroseptal hypokinesis.  Akinesis of the apex.        Home Medications    Prior to Admission medications   Medication Sig Start Date End Date Taking? Authorizing Provider  amLODipine (NORVASC) 10 MG tablet Take 10 mg by mouth every evening.    Yes [provider]  aspirin 81 MG chewable tablet Chew 1 tablet (81 mg total) by mouth daily. 03/29/16  Yes Isaiah Serge, NP  carvedilol (COREG) 6.25 MG tablet Take 0.5 tablets (3.125 mg total) by mouth every morning AND 1 tablet (6.25 mg total) every evening. Patient taking differently: Take 3.125 mg by mouth in the morning  and 6.25 mg in the evening 04/26/18  Yes Leonie Man, MD  ciprofloxacin (CIPRO) 500 MG tablet Take 500 mg by mouth 2 (two) times daily. FOR 10 DAYS 05/23/18 06/01/18 Yes [provider]  clopidogrel (PLAVIX) 75 MG tablet Take 1 tablet (75 mg total) by mouth daily. 09/22/17  Yes Croitoru, Dani Gobble, MD  doxazosin (  CARDURA) 1 MG tablet Take 1 tablet (1 mg total) by mouth daily. 04/21/18  Yes Dessa Phi, DO  Ferrous Sulfate Dried (FERROUS SULFATE IRON) 200 (65 Fe) MG TABS Take 1 tablet by mouth 2 (two) times daily after a meal.   Yes [provider]  furosemide (LASIX) 20 MG tablet Take 1 tablet (20 mg total) by mouth daily. 04/21/18  Yes Dessa Phi, DO  glimepiride (AMARYL) 4 MG tablet Take 4 mg by mouth 2 (two) times daily with a meal.    Yes [provider]  isosorbide mononitrate (IMDUR) 60 MG 24 hr tablet Take 1 tablet (60 mg total) by mouth 2 (two) times daily. 01/28/18  Yes Eulogio Bear U, DO  metFORMIN (GLUCOPHAGE) 1000 MG tablet Take 1 tablet (1,000 mg total) by mouth 2 (two) times daily. 03/29/16  Yes Isaiah Serge, NP  nitroGLYCERIN (NITROSTAT) 0.4 MG SL tablet Place 1 tablet (0.4 mg total) under the tongue every 5 (five) minutes as needed for chest pain (up to 3 doses). Patient taking differently: Place 0.4 mg under the tongue every 5 (five) minutes x 3 doses as needed for chest pain.  08/25/17  Yes Almyra Deforest, PA  oxybutynin (DITROPAN) 5 MG tablet Take 5 mg by mouth daily as needed for bladder spasms.  09/07/17  Yes [provider]  carvedilol (COREG) 12.5 MG tablet Take 6.25 mg by mouth 2 (two) times daily with a meal.    [provider]  hydrALAZINE (APRESOLINE) 100 MG tablet Take 1 tablet (100 mg total) by mouth 2 (two) times daily. DISCONTINUE PERVIOUS DOSE  PATIENT WILL CALL WHEN REFILL IS NEEDED Patient not taking: Reported on 05/24/2018 04/26/18   Leonie Man, MD    Family History Family History  Problem Relation Age of Onset  .  Heart Problems Father 64    Social History Social History   Tobacco Use  . Smoking status: Former Smoker    Packs/day: 0.33    Years: 10.00    Pack years: 3.30    Types: Cigarettes    Last attempt to quit: 1961    Years since quitting: 59.0  . Smokeless tobacco: Never Used  Substance Use Topics  . Alcohol use: No  . Drug use: No     Allergies   Other; Statins; Atorvastatin; Crestor [rosuvastatin]; Pravastatin; and Simvastatin   Review of Systems Review of Systems  Constitutional: Positive for chills.  Respiratory: Negative for shortness of breath.   Cardiovascular: Negative for chest pain.  Gastrointestinal: Negative for abdominal pain and vomiting.  Neurological: Positive for speech difficulty and weakness. Negative for headaches.  All other systems reviewed and are negative.    Physical Exam Updated Vital Signs BP (!) 169/71   Pulse (!) 54   Temp 97.7 F (36.5 C) (Rectal)   Resp (!) 21   Ht 6\' 1"  (1.854 m)   Wt 98.1 kg   SpO2 96%   BMI 28.53 kg/m   Physical Exam Vitals signs and nursing note reviewed.  Constitutional:      Appearance: He is well-developed.  HENT:     Head: Normocephalic and atraumatic.     Right Ear: External ear normal.     Left Ear: External ear normal.     Nose: Nose normal.  Eyes:     General:        Right eye: No discharge.        Left eye: No discharge.  Neck:     Musculoskeletal: Neck  supple.  Cardiovascular:     Rate and Rhythm: Normal rate and regular rhythm.     Heart sounds: Normal heart sounds.  Pulmonary:     Effort: Pulmonary effort is normal.     Breath sounds: Normal breath sounds.  Abdominal:     Palpations: Abdomen is soft.     Tenderness: There is no abdominal tenderness.  Skin:    General: Skin is warm and dry.  Neurological:     Mental Status: He is alert and oriented to person, place, and time.     Comments: CN 3-12 grossly intact. Very slightly facial droop on right. 5/5 strength in all 4  extremities save for weakness in R hand. Grossly normal sensation. Normal finger to nose on the left, difficult on right but due to hand weakness.  Psychiatric:        Mood and Affect: Mood is not anxious.      ED Treatments / Results  Labs (all labs ordered are listed, but only abnormal results are displayed) Labs Reviewed  CBC - Abnormal; Notable for the following components:      Result Value   RBC 3.15 (*)    Hemoglobin 8.4 (*)    HCT 28.1 (*)    MCHC 29.9 (*)    RDW 18.5 (*)    All other components within normal limits  DIFFERENTIAL - Abnormal; Notable for the following components:   Abs Immature Granulocytes 0.17 (*)    All other components within normal limits  COMPREHENSIVE METABOLIC PANEL - Abnormal; Notable for the following components:   Glucose, Bld 67 (*)    Creatinine, Ser 1.41 (*)    Albumin 3.4 (*)    GFR calc non Af Amer 45 (*)    GFR calc Af Amer 52 (*)    All other components within normal limits  URINALYSIS, ROUTINE W REFLEX MICROSCOPIC - Abnormal; Notable for the following components:   Color, Urine STRAW (*)    Protein, ur 100 (*)    Leukocytes, UA TRACE (*)    Bacteria, UA RARE (*)    All other components within normal limits  I-STAT CHEM 8, ED - Abnormal; Notable for the following components:   Creatinine, Ser 1.40 (*)    Glucose, Bld 64 (*)    Hemoglobin 9.2 (*)    HCT 27.0 (*)    All other components within normal limits  CBG MONITORING, ED - Abnormal; Notable for the following components:   Glucose-Capillary 59 (*)    All other components within normal limits  URINE CULTURE  ETHANOL  PROTIME-INR  APTT  RAPID URINE DRUG SCREEN, HOSP PERFORMED  I-STAT TROPONIN, ED  CBG MONITORING, ED    EKG EKG Interpretation  Date/Time:  Tuesday May 24 2018 14:32:35 EST Ventricular Rate:  55 PR Interval:    QRS Duration: 103 QT Interval:  459 QTC Calculation: 439 R Axis:   46 Text Interpretation:  Junctional rhythm Low voltage, extremity  leads Abnormal T, consider ischemia, diffuse leads similar to Apr 19 2018 Confirmed by Sherwood Gambler [phone]) on 05/24/2018 2:38:36 PM   Radiology Ct Angio Head W Or Wo Contrast  Result Date: 05/24/2018 CLINICAL DATA:  Slurred speech and right facial droop EXAM: CT ANGIOGRAPHY HEAD AND NECK TECHNIQUE: Multidetector CT imaging of the head and neck was performed using the standard protocol during bolus administration of intravenous contrast. Multiplanar CT image reconstructions and MIPs were obtained to evaluate the vascular anatomy. Carotid stenosis measurements (when applicable) are obtained utilizing  NASCET criteria, using the distal internal carotid diameter as the denominator. CONTRAST:  178mL ISOVUE-370 IOPAMIDOL (ISOVUE-370) INJECTION 76% COMPARISON:  Head CT 05/24/2018 FINDINGS: CTA NECK FINDINGS SKELETON: Lower cervical degenerative disc disease. No bony spinal canal stenosis. OTHER NECK: Normal pharynx, larynx and major salivary glands. No cervical lymphadenopathy. Unremarkable thyroid gland. UPPER CHEST: No pneumothorax or pleural effusion. No nodules or masses. AORTIC ARCH: There is mild calcific atherosclerosis of the aortic arch. There is no aneurysm, dissection or hemodynamically significant stenosis of the visualized ascending aorta and aortic arch. Conventional 3 vessel aortic branching pattern. The visualized proximal subclavian arteries are widely patent. RIGHT CAROTID SYSTEM: --Common carotid artery: Widely patent origin without common carotid artery dissection or aneurysm. --Internal carotid artery: No dissection, occlusion or aneurysm. Mild atherosclerotic calcification at the carotid bifurcation without hemodynamically significant stenosis. --External carotid artery: No acute abnormality. LEFT CAROTID SYSTEM: --Common carotid artery: Widely patent origin without common carotid artery dissection or aneurysm. --Internal carotid artery: No dissection, occlusion or aneurysm. There is mixed  density atherosclerosis extending into the proximal ICA, resulting in 60% stenosis. --External carotid artery: No acute abnormality. VERTEBRAL ARTERIES: Left dominant configuration. Both origins are normal. The right vertebral artery is diminutive along its entire course with attenuated contrast-enhancement. There is complete loss of opacification beyond the proximal V3 segment. The dominant left vertebral artery is widely patent along its entire course. There is atherosclerotic calcification of the V4 segment without hemodynamically significant stenosis. CTA HEAD FINDINGS POSTERIOR CIRCULATION: --Basilar artery: Normal. --Posterior cerebral arteries: Normal. The right PCA is predominantly supplied by the posterior communicating artery. --Superior cerebellar arteries: Normal. --Inferior cerebellar arteries: Normal AICA bilaterally. The right PICA is supplied from the left PICA. ANTERIOR CIRCULATION: --Intracranial internal carotid arteries: Moderate atherosclerotic calcification. --Anterior cerebral arteries: Normal. Both A1 segments are present. Patent anterior communicating artery. --Middle cerebral arteries: There is severe stenosis of the distal left M1 segment, which is otherwise normal caliber distally. The right middle cerebral artery is normal. --Posterior communicating arteries: Right p-comm gives rise to the right PCA. No left p-comm is visualized. VENOUS SINUSES: As permitted by contrast timing, patent. ANATOMIC VARIANTS: Fetal origin of the right posterior cerebral artery. DELAYED PHASE: Not performed Review of the MIP images confirms the above findings. IMPRESSION: 1. No emergent large vessel occlusion. 2. Severe stenosis of the distal left M1 segment, with preserved distal patency. 3. Attenuated appearance of the right vertebral artery with complete loss of patency at the proximal V3 segment. Right PICA supplied via collaterals from left PICA. 4. 60% stenosis of the proximal left internal carotid  artery. These results were communicated to Dr. Karena Addison Aroor at 2:52 pm on 05/24/2018 by text page via the [facility] messaging system. Electronically Signed   By: Ulyses Jarred M.D.   On: 05/24/2018 14:51   Ct Angio Neck W Or Wo Contrast  Result Date: 05/24/2018 CLINICAL DATA:  Slurred speech and right facial droop EXAM: CT ANGIOGRAPHY HEAD AND NECK TECHNIQUE: Multidetector CT imaging of the head and neck was performed using the standard protocol during bolus administration of intravenous contrast. Multiplanar CT image reconstructions and MIPs were obtained to evaluate the vascular anatomy. Carotid stenosis measurements (when applicable) are obtained utilizing NASCET criteria, using the distal internal carotid diameter as the denominator. CONTRAST:  130mL ISOVUE-370 IOPAMIDOL (ISOVUE-370) INJECTION 76% COMPARISON:  Head CT 05/24/2018 FINDINGS: CTA NECK FINDINGS SKELETON: Lower cervical degenerative disc disease. No bony spinal canal stenosis. OTHER NECK: Normal pharynx, larynx and major salivary glands. No cervical lymphadenopathy.  Unremarkable thyroid gland. UPPER CHEST: No pneumothorax or pleural effusion. No nodules or masses. AORTIC ARCH: There is mild calcific atherosclerosis of the aortic arch. There is no aneurysm, dissection or hemodynamically significant stenosis of the visualized ascending aorta and aortic arch. Conventional 3 vessel aortic branching pattern. The visualized proximal subclavian arteries are widely patent. RIGHT CAROTID SYSTEM: --Common carotid artery: Widely patent origin without common carotid artery dissection or aneurysm. --Internal carotid artery: No dissection, occlusion or aneurysm. Mild atherosclerotic calcification at the carotid bifurcation without hemodynamically significant stenosis. --External carotid artery: No acute abnormality. LEFT CAROTID SYSTEM: --Common carotid artery: Widely patent origin without common carotid artery dissection or aneurysm. --Internal carotid artery: No  dissection, occlusion or aneurysm. There is mixed density atherosclerosis extending into the proximal ICA, resulting in 60% stenosis. --External carotid artery: No acute abnormality. VERTEBRAL ARTERIES: Left dominant configuration. Both origins are normal. The right vertebral artery is diminutive along its entire course with attenuated contrast-enhancement. There is complete loss of opacification beyond the proximal V3 segment. The dominant left vertebral artery is widely patent along its entire course. There is atherosclerotic calcification of the V4 segment without hemodynamically significant stenosis. CTA HEAD FINDINGS POSTERIOR CIRCULATION: --Basilar artery: Normal. --Posterior cerebral arteries: Normal. The right PCA is predominantly supplied by the posterior communicating artery. --Superior cerebellar arteries: Normal. --Inferior cerebellar arteries: Normal AICA bilaterally. The right PICA is supplied from the left PICA. ANTERIOR CIRCULATION: --Intracranial internal carotid arteries: Moderate atherosclerotic calcification. --Anterior cerebral arteries: Normal. Both A1 segments are present. Patent anterior communicating artery. --Middle cerebral arteries: There is severe stenosis of the distal left M1 segment, which is otherwise normal caliber distally. The right middle cerebral artery is normal. --Posterior communicating arteries: Right p-comm gives rise to the right PCA. No left p-comm is visualized. VENOUS SINUSES: As permitted by contrast timing, patent. ANATOMIC VARIANTS: Fetal origin of the right posterior cerebral artery. DELAYED PHASE: Not performed Review of the MIP images confirms the above findings. IMPRESSION: 1. No emergent large vessel occlusion. 2. Severe stenosis of the distal left M1 segment, with preserved distal patency. 3. Attenuated appearance of the right vertebral artery with complete loss of patency at the proximal V3 segment. Right PICA supplied via collaterals from left PICA. 4. 60%  stenosis of the proximal left internal carotid artery. These results were communicated to Dr. Karena Addison Aroor at 2:52 pm on 05/24/2018 by text page via the [facility] messaging system. Electronically Signed   By: Ulyses Jarred M.D.   On: 05/24/2018 14:51   Ct Head Code Stroke Wo Contrast  Result Date: 05/24/2018 CLINICAL DATA:  Code stroke. 85 y/o M; slurred speech and right facial droop. EXAM: CT HEAD WITHOUT CONTRAST TECHNIQUE: Contiguous axial images were obtained from the base of the skull through the vertex without intravenous contrast. COMPARISON:  12/13/2017 CT head. FINDINGS: Brain: No evidence of acute infarction, hemorrhage, hydrocephalus, extra-axial collection or mass lesion/mass effect. Stable small chronic left frontoparietal infarct and left lentiform nucleus lacunar infarct. Stable nonspecific white matter hypodensities compatible with chronic microvascular ischemic changes. Stable volume loss of the brain. Vascular: Calcific atherosclerosis of carotid siphons. No hyperdense vessel identified. Skull: Normal. Negative for fracture or focal lesion. Sinuses/Orbits: Mild maxillary and ethmoid sinus mucosal thickening. Normal aeration of the mastoid air cells. Other: None. ASPECTS [facility] Stroke Program Early CT Score) - Ganglionic level infarction (caudate, lentiform nuclei, internal capsule, insula, M1-M3 cortex): 7 - Supraganglionic infarction (M4-M6 cortex): 3 Total score (0-10 with 10 being normal): 10 IMPRESSION: 1. No acute intracranial  abnormality identified. 2. Stable small chronic left frontoparietal infarct. Stable chronic microvascular ischemic changes and volume loss of the brain. 3. ASPECTS is 10 These results were communicated to Dr. Lorraine Lax at 2:22 pmon 1/7/2020by text page via the [facility] messaging system. Electronically Signed   By: Kristine Garbe M.D.   On: 05/24/2018 14:25    Procedures Procedures (including critical care time)  Medications Ordered in ED Medications  sodium  chloride 0.9 % bolus 500 mL (500 mLs Intravenous New Bag/Given 05/24/18 1516)  iopamidol (ISOVUE-370) 76 % injection 100 mL (100 mLs Intravenous Contrast Given 05/24/18 1434)  dextrose 50 % solution 50 mL ( Intravenous Given 05/24/18 1400)     Initial Impression / Assessment and Plan / ED Course  I have reviewed the triage vital signs and the nursing notes.  Pertinent labs & imaging results that were available during my care of the patient were reviewed by me and considered in my medical decision making (see chart for details).     Patient is not a TPA candidate and it sounds like his symptoms started before 1230.  Could be related to the M1 stenosis and he is having a watershed stroke.  He will be given IV fluids.  At this point, neurology recommends MRI and admission for stroke treatment and work-up.  Permissive hypertension at this point.  Internal medicine teaching service to admit.  Final Clinical Impressions(s) / ED Diagnoses   Final diagnoses:  Right sided weakness    ED Discharge Orders    None       Sherwood Gambler, MD 05/24/18 [phone]

## 2018-05-24 NOTE — Consult Note (Signed)
Requesting Physician: Dr. Sherwood Gambler  Chief Complaint: Slurred speech, right-sided weakness  History obtained from: Patient and Chart     HPI:                                                                                                                                       [name] is an 85 y.o. male left handed with past medical history of hypertension, hyperlipidemia, coronary artery disease prior stroke with left ICA stenosis and severe left M1 stenosis and prior CVA with residual mild right hemiparesis presents to the emergency department with slurred speech and right sided weakness.  Last clear known normal is unclear, patient states that he noted his right side was weaker than normal again at 3 AM today, however per EMS was around 12:30 PM.  Patient noted to have slurred speech mild facial droop and right-sided weakness and was brought in as a code stroke.  On assessment patient had a NIH stroke scale of 5.  Weakness was most notably in the right hand.  tPA was not given due to unclear last known normal.  CT head demonstrated chronic infarcts, but no acute findings.  CT angiogram of the head and neck was performed which showed no large vessel occlusion.   Date last known well: 1.7.20 Time last known well: 3AM vs 12 PM tPA Given:  NIHSS:  Baseline MRS    Past Medical History:  Diagnosis Date  . Abdominal aortic aneurysm ([place])    a. Aortic duplex 06/2014: mild aneurysmal dilatation of proximal abdominal aorta measuring 3.4x3.4cm. No sig change from 2012. F/u due 06/2016;   . AKI (acute kidney injury) ([place]) 03/28/2016  . Arthritis    "right knee; never bothered me" (03/26/2016)  . 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 CAD in native artery    a. NSTEMI 11/2010 - CABG x2(LIMA to LAD, SVG to PDA). b. NEG Lexi MV 10/24/13, EF 53%, no perfusion abnormality, septal and apical HK noted. c. NSTEMI 05/2014 - s/p DES to SVG-RPDA 06/18/14 (Xience  Alpine DES 3.0 x 18 mm -3.35 mm), EF 60-65; d. 03/2016 STEMI/PCI: LM nl, ost LAD 70%, mLCx 50%, pRCA 95% - mRCA 60%, dRPDA 70%, LIMA->LAD ok, o-p SVG->RPDA 100% (3.0x16 Promus DES overlapping prior stent).  . Diastolic dysfunction    a. 03/2016 Echo: EF 60-65%, no rwma, Gr1 DD, triv AI, Ao root 43mm, Asc Ao 14mm, triv MR.  . Dizziness    a. Carotid duplex 03/2014: mild fibrous plaque, no significant stenosis.  . Foley catheter in place    "been wearing it for a couple months now" (03/26/2016)  . HTN (hypertension)   . Hyperlipidemia LDL goal <70   . Non-STEMI (non-ST elevated myocardial infarction) ([place]) 11/2010; 03/2018   1. Ostial LAD 70% (to close to [facility] for PCI), subtotal occlusion of the RCA; 2.  In-stent  restenosis SVG-RCA -PTCA.  Marland Kitchen Postoperative atrial fibrillation ([place]) 11/2010   Post CABG, no sign recurrence  . Refusal of blood transfusions as patient is Jehovah's Witness   . 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 Servando Snare)  . Sleep apnea    Not on CPAP. (03/26/2016)  . ST elevation myocardial infarction (STEMI) of inferior wall ([place]) 04/05/2016   Occluded in-stent restenosis/thrombosis of SVG-RCA --> treated with overlapping Promus DES 3.0 mm 16 mm postdilated 3.0 mm).  . Stroke [facility]) 2014; 01/2015   a. 2014 with mild right hand weakness, nonhemorrhagic per pt.;; b. - PTA-Stent L ICA 95%   . Type II diabetes mellitus ([place])     Past Surgical History:  Procedure Laterality Date  . APPENDECTOMY    . CARDIAC CATHETERIZATION  12/11/2010   Dr. Chase Picket - subsequent cath - normal LV systolic function, no renal artery stenosis, severe 2-vessel disease with subtotaled RCA prox and distal 60% lesion and complex 70% area of narrowing of ostium of LAD  . CARDIAC CATHETERIZATION N/A 03/26/2016   Procedure: Left Heart Cath and Coronary Angiography;  Surgeon: Nelva Bush, MD;  Location: [place] [facility];  Service: Cardiovascular;  Laterality: N/A;  . CARDIAC  CATHETERIZATION N/A 03/26/2016   Procedure: Coronary Stent Intervention;  Surgeon: Nelva Bush, MD;  Location: [place] [facility];  Service: Cardiovascular: 100% In-stent thrombosis of pros SVG-RCA (Xience DES) --> treated with PromusDES 3.0 x 18 (3.3 mm)  . CARDIAC CATHETERIZATION N/A 03/26/2016   Procedure: Bypass Graft Angiography;  Surgeon: Nelva Bush, MD;  Location: [place] [facility];  Service: Cardiovascular;  Laterality: N/A;  . CORONARY ANGIOPLASTY WITH STENT PLACEMENT  06/18/2014   PCI to SVG-RPDA 06/18/14 (Xience Alpine DES 3.0 x 18 mm -3.35 mm),   . CORONARY ARTERY BYPASS GRAFT  12/15/2010   X2, Dr Servando Snare; LIMA to LAD, SVG to PDA;   . CORONARY BALLOON ANGIOPLASTY N/A 04/18/2018   Procedure: CORONARY BALLOON ANGIOPLASTY;  Surgeon: Leonie Man, MD;  Location: [place] [facility];  Service: Cardiovascular;;; high pressure scoring and noncompliant balloon PTCA of SVG-RCA ISR ostial and proximal  . CORONARY/GRAFT ANGIOGRAPHY N/A 08/27/2017   Procedure: CORONARY/GRAFT ANGIOGRAPHY;  Surgeon: Nelva Bush, MD;  Location: [place] [facility];  Service: Cardiovascular;; pLAD 70%, ostD1 50%.  mCx 60%, OM2 80%. pRCA 95% & mRCA 100% - rPDA 70%. LIMA-mLAD patent. SVG-rPDA 10% ISR.   Marland Kitchen CYSTOSCOPY WITH URETHRAL DILATATION    . IR ANGIO INTRA EXTRACRAN SEL COM CAROTID INNOMINATE BILAT MOD SED  02/25/2017  . IR ANGIO INTRA EXTRACRAN SEL COM CAROTID INNOMINATE BILAT MOD SED  10/19/2017   Dr. Kathee Delton: L Common Carotid - ECA & major branches widely patent. ICA ~20% distal to bulb & 50% in supraclinoid segment. LMCA-distal 1/3 MI ~905 stenosis with post-stenotic dilation into inferior division. ~50% prox Basilar A stenosis @ anterior Inf Cerebellar A. 50% R ICA  . IR ANGIO VERTEBRAL SEL SUBCLAVIAN INNOMINATE UNI L MOD SED  02/25/2017  . IR ANGIO VERTEBRAL SEL SUBCLAVIAN INNOMINATE UNI R MOD SED  10/19/2017  . IR ANGIO VERTEBRAL SEL VERTEBRAL UNI L MOD SED  10/19/2017  . IR GENERIC HISTORICAL  01/21/2016    IR RADIOLOGIST EVAL & MGMT 01/21/2016 [facility]-INTERV RAD  . IR GENERIC HISTORICAL  02/03/2016   IR CATHETER TUBE CHANGE 02/03/2016 Marybelle Killings, MD [facility]-INTERV RAD  . IR RADIOLOGIST EVAL & MGMT  11/09/2017  . LEFT HEART CATH AND CORONARY ANGIOGRAPHY  N/A 04/18/2018   Procedure: LEFT HEART CATH AND CORONARY ANGIOGRAPHY;  Surgeon: Leonie Man, MD;  Location: [place] [facility];  Service: Cardiovascular;  Laterality: N/A; stable findings on last cath with exception of 75% in-stent restenosis of SVG-RCA ostial stent treated with PTCA.    Marland Kitchen LEFT HEART CATHETERIZATION WITH CORONARY ANGIOGRAM N/A 06/18/2014   Procedure: LEFT HEART CATHETERIZATION WITH CORONARY ANGIOGRAM;  Surgeon: Leonie Man, MD;  Location: [facility] CATH LAB;  Service: Cardiovascular;  -- severe disease of SVG-rPDA  . RADIOLOGY WITH ANESTHESIA N/A 01/24/2015   Procedure: STENT ASSISTED ANGIOPLASTY (RADIOLOGY WITH ANESTHESIA);  Surgeon: Luanne Bras, MD;  Location: [place];  Service: Radiology;  Laterality: N/A;  . TONSILLECTOMY    . TRANSTHORACIC ECHOCARDIOGRAM  08/2017; 04/2018:    A) EF 60-65%. Mild LVH. No RWMA. Gr 1 DD. Mod LA dilation.;  B)  EF 35-40%.  Severe LVH.  GRII DD.  Apical anteroseptal hypokinesis.  Akinesis of the apex.    Family History  Problem Relation Age of Onset  . Heart Problems Father 66   Social History:  reports that he quit smoking about 59 years ago. His smoking use included cigarettes. He has a 3.30 pack-year smoking history. He has never used smokeless tobacco. He reports that he does not drink alcohol or use drugs.  Allergies:  Allergies  Allergen Reactions  . Other Other (See Comments)    No  BLOOD PRODUCTS - Pt is Jehovah's Witness  . Statins Other (See Comments)    Cause muscle aches  . Atorvastatin Other (See Comments)    Myalgias  . Crestor [Rosuvastatin] Other (See Comments)    Myalgias   . Pravastatin Other (See Comments)    Myalgias  . Simvastatin Other (See Comments)    Myalgias      Medications:                                                                                                                        I reviewed home medications   ROS:                                                                                                                                     14 systems reviewed and negative except above    Examination:  General: Appears well-developed  Psych: Affect appropriate to situation Eyes: No scleral injection HENT: No OP obstrucion Head: Normocephalic.  Cardiovascular: Normal rate and regular rhythm.  Respiratory: Effort normal and breath sounds normal to anterior ascultation GI: Soft.  No distension. There is no tenderness.  Skin: WDI    Neurological Examination Mental Status: Alert, oriented, thought content appropriate.  Speech fluent without evidence of aphasia. Able to follow 3 step commands without difficulty. Cranial Nerves: II: Visual fields grossly normal,  III,IV, VI: ptosis not present, extra-ocular motions intact bilaterally, pupils equal, round, reactive to light and accommodation V,VII: smile symmetric, facial light touch sensation normal bilaterally VIII: hearing normal bilaterally IX,X: uvula rises symmetrically XI: bilateral shoulder shrug XII: midline tongue extension Motor: Right : Upper extremity   5/5    Left:     Upper extremity   5/5  Lower extremity   5/5     Lower extremity   5/5 Tone and bulk:normal tone throughout; no atrophy noted Sensory: Pinprick and light touch intact throughout, bilaterally Deep Tendon Reflexes: 2+ and symmetric throughout Plantars: Right: downgoing   Left: downgoing Cerebellar: normal finger-to-nose, normal rapid alternating movements and normal heel-to-shin test Gait: normal gait and station     Lab Results: Basic Metabolic Panel: Recent Labs  Lab 05/24/18 1412  NA  141  K 4.0  CL 107  GLUCOSE 64*  BUN 19  CREATININE 1.40*    CBC: Recent Labs  Lab 05/24/18 1412  HGB 9.2*  HCT 27.0*    Coagulation Studies: No results for input(s): LABPROT, INR in the last 72 hours.  Imaging: No results found.   ASSESSMENT AND PLAN  85 y.o. male left handed with past medical history of hypertension, hyperlipidemia, coronary artery disease prior stroke with left ICA stenosis and severe left M1 stenosis and prior CVA with residual mild right hemiparesis presents to the emergency department with slurred speech and right sided weakness.  Did not receive TPA due to outside window from last known normal of 3 AM.  Patient will be admitted for further stroke evaluation.    Acute Ischemic Stroke   Etiology: suspect intracranial atherosclerotic disease with severe Left MCA stenosis   Recommend # MRI of the brain without contrast #CTA head and neck shows severe left M1 stenosis  #Transthoracic Echo  # Continue ASA 81mg  and Plavix 75mg  daily for intracranial atherosclerotic disease  #Start or continue Atorvastatin 40 mg/other high intensity statin # BP goal: permissive HTN upto 220/120 mmHg  # Telemetry monitoring # Frequent neuro checks # NPO until passes stroke swallow screen  Please page stroke NP  Or  PA  Or MD from 8am -4 pm  as this patient from this time will be  followed by the stroke.   You can look them up on [email or web address]  Password [facility]      [facility] Pager Number [phone]

## 2018-05-24 NOTE — Code Documentation (Signed)
85 yo male coming from home with complaints of right sided weakness and slurred speech that was noted by family. EMS called and activated a Code Stroke. Stroke Team met patient upon arrival. Initial NIHSS 5 due to facial palsy, limb ataxia, sensory decreased on the right, and mild dysarthria. CT Head completed and no hemorrhage noted. When you spoke with the patient, pt noted weakness at 0300 this morning when he got out of bed. Last Known Well is last night when he went to bed. CTA completed after Creatinine was approved. CTA showed no LVO. Patient outside the window for tPA. Handoff given to Burman Nieves, Therapist, sports.

## 2018-05-24 NOTE — H&P (Signed)
Date: 05/24/2018               Patient Name:  [name] MRN: [id number]  DOB: 04/23/1931 Age / Sex: 85 y.o., male   PCP: Suzan Garibaldi, FNP              Medical Service: Internal Medicine Teaching Service              Attending Physician: Dr. Lucious Groves, DO    First Contact: Beau Fanny, [place] [address] Pager: [phone]  Second Contact: Dr. Alfonse Spruce Pager: [phone]  Third Contact Dr. Frederico Hamman Pager: [phone]       After Hours (After 5p/  First Contact Pager: [phone]  weekends / holidays): Second Contact Pager: [phone]   Chief Complaint: Right sided weakness  History of Present Illness: [name] is a 85 yo M with significant vasculopathic hx (including AAA, hx of CABG, MI, CHF, and carotid stenosis), Type 2 Diabetes, hx of stroke, HTN, HLD, CKD, and chronic anemia. He states that he has been feeling "puny" for the past few days. His daughter states that he was taken to the ED this past saturday for abdominal pain and fullness, and decreased urine flow into his catheter bag. He was subsequently diagnosed with a UTI and started on abx. In the following days, he never returned back to normal. His daughter also states that he has been having hx of low blood sugars for the past few days, but that this was not a problem he has had in the past. She also states that he has not been eating well lately so could be due to poor oral intake.  Last night he started feeling confused and his wife checked his blood sugars and they were low so she gave him some orange juice, he did not return to normal after that but felt better. Then this morning around 10 he started to notice that his right hand was "dead" and he could not move it like normal. His nephew was visiting around 11:30am and he noted trouble speaking, could not put sentences together and his speech was difficult to understand and he was not focusing well. He also noted that his right hand was not moving at all.   He has baseline has weakness in  right arm from a prior stroke but it was getting a lot better with PT and his current level of weakness is much worse than normal. His nephew does states that he seems to be improving over past few hours though. He denies chest pain, recent illness, nasal, congestion, HA, or dysphagia. His mental status does seem to be mostly resolved now as well according to his family. He notes possible bouts of sweating but denies noticing any fevers.   Meds: No current facility-administered medications for this encounter.    Current Outpatient Medications  Medication Sig Dispense Refill  . amLODipine (NORVASC) 10 MG tablet Take 10 mg by mouth every evening.     Marland Kitchen aspirin 81 MG chewable tablet Chew 1 tablet (81 mg total) by mouth daily.    . carvedilol (COREG) 6.25 MG tablet Take 0.5 tablets (3.125 mg total) by mouth every morning AND 1 tablet (6.25 mg total) every evening. (Patient taking differently: Take 3.125 mg by mouth in the morning and 6.25 mg in the evening) 135 tablet 3  . ciprofloxacin (CIPRO) 500 MG tablet Take 500 mg by mouth 2 (two) times daily. FOR 10 DAYS    . clopidogrel (PLAVIX) 75 MG tablet Take  1 tablet (75 mg total) by mouth daily. 90 tablet 3  . doxazosin (CARDURA) 1 MG tablet Take 1 tablet (1 mg total) by mouth daily. 30 tablet 0  . Ferrous Sulfate Dried (FERROUS SULFATE IRON) 200 (65 Fe) MG TABS Take 1 tablet by mouth 2 (two) times daily after a meal.    . furosemide (LASIX) 20 MG tablet Take 1 tablet (20 mg total) by mouth daily. 30 tablet 0  . glimepiride (AMARYL) 4 MG tablet Take 4 mg by mouth 2 (two) times daily with a meal.     . isosorbide mononitrate (IMDUR) 60 MG 24 hr tablet Take 1 tablet (60 mg total) by mouth 2 (two) times daily. 60 tablet 0  . metFORMIN (GLUCOPHAGE) 1000 MG tablet Take 1 tablet (1,000 mg total) by mouth 2 (two) times daily.    . nitroGLYCERIN (NITROSTAT) 0.4 MG SL tablet Place 1 tablet (0.4 mg total) under the tongue every 5 (five) minutes as needed for chest  pain (up to 3 doses). (Patient taking differently: Place 0.4 mg under the tongue every 5 (five) minutes x 3 doses as needed for chest pain. ) 25 tablet 3  . oxybutynin (DITROPAN) 5 MG tablet Take 5 mg by mouth daily as needed for bladder spasms.     . carvedilol (COREG) 12.5 MG tablet Take 6.25 mg by mouth 2 (two) times daily with a meal.    . hydrALAZINE (APRESOLINE) 100 MG tablet Take 1 tablet (100 mg total) by mouth 2 (two) times daily. DISCONTINUE PERVIOUS DOSE  PATIENT WILL CALL WHEN REFILL IS NEEDED (Patient not taking: Reported on 05/24/2018) 180 tablet 3    Allergies: Allergies as of 05/24/2018 - Review Complete 05/24/2018  Allergen Reaction Noted  . Other Other (See Comments) 02/15/2013  . Statins Other (See Comments) 01/28/2018  . Atorvastatin Other (See Comments) 06/19/2014  . Crestor [rosuvastatin] Other (See Comments) 06/27/2014  . Pravastatin Other (See Comments) 02/15/2013  . Simvastatin Other (See Comments) 06/19/2014   Past Surgical Hx: CABGx2, Stents in Brain  Family History  Problem Relation Age of Onset  . Heart Problems Father 21   Social Hx: Married to wife of 8 years, father of 1 daughter. Quit smoking 50 years ago, Does not drink alcohol.   Review of Systems: Pertinent items noted in HPI and remainder of comprehensive ROS otherwise negative.  Physical Exam: Blood pressure (!) 177/69, pulse (!) 55, temperature 98.4 F (36.9 C), temperature source Oral, resp. rate 13, height 6\' 1"  (1.854 m), weight 98.1 kg, SpO2 94 %. General appearance: alert, cooperative and no distress Head: Normocephalic, without obvious abnormality, atraumatic Eyes: conjunctivae/corneas clear. PERRL, EOM's intact. Fundi benign. Throat: normal findings: palate normal, tongue midline and normal and soft palate normal Lungs: clear to auscultation bilaterally and no wheezes, crackles, or rhonchi Heart: regular rate and rhythm, S1, S2 normal, no murmur, click, rub or gallop Abdomen: soft,  nontender, normal bowel sounds, non-disteneded Extremities: no edema, redness or tenderness in the calves or thighs Neurologic: Mental status: Alert, oriented, thought content appropriate Cranial nerves: II: pupils equal, round, reactive to light and accommodation, III,VII: ptosis not noted, III,IV,VI: extraocular muscles extra-ocular motions intact, V: mastication normal, V: facial light touch sensation normal bilaterally, VII: upper facial muscle function normal bilaterally, VII: lower facial muscle function normal bilaterally, IX: soft palate elevation normal bilaterally Sensory: normal, sensation intact bilaterally on face and lower limbs but decreased sensation to light touch on right arm vs left.  Motor: Strength decreased on right  side of body universally but most notable on right hand and arm. He is able to move all limbs against gravity. grip strength, wrist strength, and shoulder strength diminished on right side compared to left.  Reflexes: +2 patellar reflexes, unable to get other reflexes Cerebellar: notable dysmetria on right hand finger to nose test. Left side intact.  Lab results: Blood glucose-52 UA looks improved from previous urine a month ago Stable anemia with hgb at 8.4 Cr improved from 1.67 to 1.41  Imaging results:  CT Angio of head and neck showed severe stenosis of M1 segment with preserved distal patency, 60% stenosis of proximal left internal carotid artery. No acute signs of vessel occlusion or bleed.    Assessment & Plan by Problem: Active Problems:   Right arm weakness Hypertension  Right arm weakness: With his significant vasculopathic hx and previous hx of stroke, he is at an increased risk of subsequent stroke. CTA head and neck shows severe left M1 stenosis. Head CT is unremarkable. MRI is however warranted to rule out an acute CVA. It is possible that this is a TIA given that his weakness, slurred speech, facial droop and confusion are resolving within the  course of the day. Neurology was consulted and we will appreciate their recommendations for further stroke workup. His symptoms may also be due to hypoglycemia or urinary tract infection (please see below). - TTE - Brain MRI - Continue ASA 81 mg and Plavix 75 mg QD - Start Atorvastatin 40 mg QD - HgBA1C, Lipid panel - Passed bedside swallow screen--Ordered diet - Continuous cardiac monitoring - Allow for permissive HTN up to 220/120 mmHg - PT/OT eval  - F/u morning labs - Frequent neuro checks  HTN: Hx of HTN on several home meds (amlodipine 10 mg, carvedilol 6.25 mg, lasix 20 mg, Imdur 60 mg). Holding HTN med for permissive htn at this time and will consider starting back BP meds in 48 hours.  - Continue to monitor BP  CAD s/p CABG: Will cont aspirin and plavix. No signs of chest pain or ischemic symptoms. BP meds stopped in meantime for permissive HTN.  Type 2 Diabetes: Hypoglycemic on admission. This could be due to recent poor oral intake in setting of ongoing illness. Will get a BMP if he has low blood sugar <70 by CBG. Will consider further work-up of hypoglycemia if BMP glucose still low. Will hold home meds (metformin and glimiperide) in the meantime until hypoglycemia resolves. - Sliding scale insulin  Chronic Anemia: Anemia stable from most recent admission.  - Continue to monitor  CHF: Hx of CHF with EF of  35-40%. Will hold his lasix and home coreg for permissive HTN. He did received fluids in the ED so we will cont to monitor his fluid status.  CKD: Cr improved from 3 wks ago. Will cont to monitor bmps daily.   UTI: Previously diagnosed by outpatient provider with a UTI and started cipro. This may be the cause of his AMS but this does seem unlikely given other more likely diagnoses. -Continue ciprofloxacin 500 mg BID  This is a Careers information officer Note.  The care of the patient was discussed with Dr. Alfonse Spruce and the assessment and plan was formulated with their assistance.   Please see their note for official documentation of the patient encounter.   Signed: Synetta Shadow, Medical Student 05/24/2018, 4:42 PM   Attestation for Student Documentation:  I personally was present and performed or re-performed the history, physical exam and medical  decision-making activities of this service and have verified that the service and findings are accurately documented in the student's note.  Carroll Sage, MD 05/24/2018, 7:14 PM

## 2018-05-24 NOTE — Progress Notes (Signed)
Pt off floor at [facility] to MRI

## 2018-05-24 NOTE — ED Triage Notes (Signed)
Pt arrives via [facility] with reports of right facial droop, decreased sensation and weakness. Pt reports he woke up at 3 and was not feeling normal. Pt has hx of CVA with some right sided deficits.

## 2018-05-24 NOTE — Progress Notes (Signed)
Patient came in to hospital with [place] cath due to chronic urinary obstruction

## 2018-05-25 ENCOUNTER — Observation Stay (HOSPITAL_COMMUNITY): Payer: Medicare Other

## 2018-05-25 DIAGNOSIS — E559 Vitamin D deficiency, unspecified: Secondary | ICD-10-CM | POA: Diagnosis present

## 2018-05-25 DIAGNOSIS — I25810 Atherosclerosis of coronary artery bypass graft(s) without angina pectoris: Secondary | ICD-10-CM

## 2018-05-25 DIAGNOSIS — I69354 Hemiplegia and hemiparesis following cerebral infarction affecting left non-dominant side: Secondary | ICD-10-CM | POA: Diagnosis not present

## 2018-05-25 DIAGNOSIS — I5032 Chronic diastolic (congestive) heart failure: Secondary | ICD-10-CM | POA: Diagnosis present

## 2018-05-25 DIAGNOSIS — E1122 Type 2 diabetes mellitus with diabetic chronic kidney disease: Secondary | ICD-10-CM

## 2018-05-25 DIAGNOSIS — I130 Hypertensive heart and chronic kidney disease with heart failure and stage 1 through stage 4 chronic kidney disease, or unspecified chronic kidney disease: Secondary | ICD-10-CM | POA: Diagnosis present

## 2018-05-25 DIAGNOSIS — D649 Anemia, unspecified: Secondary | ICD-10-CM

## 2018-05-25 DIAGNOSIS — I129 Hypertensive chronic kidney disease with stage 1 through stage 4 chronic kidney disease, or unspecified chronic kidney disease: Secondary | ICD-10-CM | POA: Diagnosis present

## 2018-05-25 DIAGNOSIS — G4733 Obstructive sleep apnea (adult) (pediatric): Secondary | ICD-10-CM | POA: Diagnosis present

## 2018-05-25 DIAGNOSIS — G8321 Monoplegia of upper limb affecting right dominant side: Secondary | ICD-10-CM | POA: Diagnosis not present

## 2018-05-25 DIAGNOSIS — Z87891 Personal history of nicotine dependence: Secondary | ICD-10-CM | POA: Diagnosis not present

## 2018-05-25 DIAGNOSIS — R531 Weakness: Secondary | ICD-10-CM

## 2018-05-25 DIAGNOSIS — Z951 Presence of aortocoronary bypass graft: Secondary | ICD-10-CM

## 2018-05-25 DIAGNOSIS — N390 Urinary tract infection, site not specified: Secondary | ICD-10-CM | POA: Diagnosis present

## 2018-05-25 DIAGNOSIS — I6930 Unspecified sequelae of cerebral infarction: Secondary | ICD-10-CM | POA: Diagnosis not present

## 2018-05-25 DIAGNOSIS — R29810 Facial weakness: Secondary | ICD-10-CM | POA: Diagnosis present

## 2018-05-25 DIAGNOSIS — I5042 Chronic combined systolic (congestive) and diastolic (congestive) heart failure: Secondary | ICD-10-CM

## 2018-05-25 DIAGNOSIS — I2510 Atherosclerotic heart disease of native coronary artery without angina pectoris: Secondary | ICD-10-CM | POA: Diagnosis present

## 2018-05-25 DIAGNOSIS — N189 Chronic kidney disease, unspecified: Secondary | ICD-10-CM | POA: Diagnosis present

## 2018-05-25 DIAGNOSIS — R0609 Other forms of dyspnea: Secondary | ICD-10-CM | POA: Diagnosis not present

## 2018-05-25 DIAGNOSIS — D62 Acute posthemorrhagic anemia: Secondary | ICD-10-CM | POA: Diagnosis not present

## 2018-05-25 DIAGNOSIS — Z7982 Long term (current) use of aspirin: Secondary | ICD-10-CM | POA: Diagnosis not present

## 2018-05-25 DIAGNOSIS — I252 Old myocardial infarction: Secondary | ICD-10-CM | POA: Diagnosis not present

## 2018-05-25 DIAGNOSIS — N183 Chronic kidney disease, stage 3 (moderate): Secondary | ICD-10-CM | POA: Diagnosis present

## 2018-05-25 DIAGNOSIS — I639 Cerebral infarction, unspecified: Secondary | ICD-10-CM

## 2018-05-25 DIAGNOSIS — I5022 Chronic systolic (congestive) heart failure: Secondary | ICD-10-CM

## 2018-05-25 DIAGNOSIS — D638 Anemia in other chronic diseases classified elsewhere: Secondary | ICD-10-CM | POA: Diagnosis present

## 2018-05-25 DIAGNOSIS — I10 Essential (primary) hypertension: Secondary | ICD-10-CM

## 2018-05-25 DIAGNOSIS — Z7984 Long term (current) use of oral hypoglycemic drugs: Secondary | ICD-10-CM | POA: Diagnosis not present

## 2018-05-25 DIAGNOSIS — I63512 Cerebral infarction due to unspecified occlusion or stenosis of left middle cerebral artery: Secondary | ICD-10-CM | POA: Diagnosis present

## 2018-05-25 DIAGNOSIS — R29705 NIHSS score 5: Secondary | ICD-10-CM | POA: Diagnosis present

## 2018-05-25 DIAGNOSIS — I169 Hypertensive crisis, unspecified: Secondary | ICD-10-CM | POA: Diagnosis not present

## 2018-05-25 DIAGNOSIS — I6522 Occlusion and stenosis of left carotid artery: Secondary | ICD-10-CM

## 2018-05-25 DIAGNOSIS — E1142 Type 2 diabetes mellitus with diabetic polyneuropathy: Secondary | ICD-10-CM | POA: Diagnosis present

## 2018-05-25 DIAGNOSIS — R4781 Slurred speech: Secondary | ICD-10-CM | POA: Diagnosis present

## 2018-05-25 DIAGNOSIS — Z79899 Other long term (current) drug therapy: Secondary | ICD-10-CM | POA: Diagnosis not present

## 2018-05-25 DIAGNOSIS — Z7902 Long term (current) use of antithrombotics/antiplatelets: Secondary | ICD-10-CM | POA: Diagnosis not present

## 2018-05-25 DIAGNOSIS — R29898 Other symptoms and signs involving the musculoskeletal system: Secondary | ICD-10-CM | POA: Diagnosis present

## 2018-05-25 DIAGNOSIS — E1165 Type 2 diabetes mellitus with hyperglycemia: Secondary | ICD-10-CM | POA: Diagnosis not present

## 2018-05-25 DIAGNOSIS — E785 Hyperlipidemia, unspecified: Secondary | ICD-10-CM | POA: Diagnosis present

## 2018-05-25 DIAGNOSIS — I420 Dilated cardiomyopathy: Secondary | ICD-10-CM | POA: Diagnosis present

## 2018-05-25 DIAGNOSIS — I714 Abdominal aortic aneurysm, without rupture: Secondary | ICD-10-CM | POA: Diagnosis present

## 2018-05-25 DIAGNOSIS — I69351 Hemiplegia and hemiparesis following cerebral infarction affecting right dominant side: Secondary | ICD-10-CM | POA: Diagnosis not present

## 2018-05-25 DIAGNOSIS — E11649 Type 2 diabetes mellitus with hypoglycemia without coma: Secondary | ICD-10-CM | POA: Diagnosis not present

## 2018-05-25 LAB — BASIC METABOLIC PANEL
Anion gap: 8 (ref 5–15)
BUN: 15 mg/dL (ref 8–23)
CO2: 24 mmol/L (ref 22–32)
CREATININE: 1.33 mg/dL — AB (ref 0.61–1.24)
Calcium: 8.6 mg/dL — ABNORMAL LOW (ref 8.9–10.3)
Chloride: 108 mmol/L (ref 98–111)
GFR calc Af Amer: 56 mL/min — ABNORMAL LOW (ref >60)
GFR calc non Af Amer: 48 mL/min — ABNORMAL LOW (ref >60)
Glucose, Bld: 68 mg/dL — ABNORMAL LOW (ref 70–99)
Potassium: 3.3 mmol/L — ABNORMAL LOW (ref 3.5–5.1)
SODIUM: 140 mmol/L (ref 135–145)

## 2018-05-25 LAB — GLUCOSE, CAPILLARY
GLUCOSE-CAPILLARY: 204 mg/dL — AB (ref 70–99)
GLUCOSE-CAPILLARY: 166 mg/dL — AB (ref 70–99)
Glucose-Capillary: 77 mg/dL (ref 70–99)
Glucose-Capillary: 258 mg/dL — ABNORMAL HIGH (ref 70–99)

## 2018-05-25 LAB — CBC
HCT: 24.9 % — ABNORMAL LOW (ref 39.0–52.0)
Hemoglobin: 7.9 g/dL — ABNORMAL LOW (ref 13.0–17.0)
MCH: 28 pg (ref 26.0–34.0)
MCHC: 31.7 g/dL (ref 30.0–36.0)
MCV: 88.3 fL (ref 80.0–100.0)
Platelets: 221 10*3/uL (ref 150–400)
RBC: 2.82 MIL/uL — ABNORMAL LOW (ref 4.22–5.81)
RDW: 18.4 % — ABNORMAL HIGH (ref 11.5–15.5)
WBC: 4.8 10*3/uL (ref 4.0–10.5)
nRBC: 0 % (ref 0.0–0.2)

## 2018-05-25 LAB — URINE CULTURE: Culture: NO GROWTH

## 2018-05-25 LAB — ECHOCARDIOGRAM LIMITED
Height: 73 in
Weight: 3460.34 oz

## 2018-05-25 MED ORDER — ROSUVASTATIN CALCIUM 20 MG PO TABS
20.0000 mg | ORAL_TABLET | Freq: Every day | ORAL | Status: DC
  Administered 2018-05-25 – 2018-05-27 (×3): 20 mg via ORAL
  Filled 2018-05-25 (×3): qty 1

## 2018-05-25 NOTE — Evaluation (Signed)
Physical Therapy Evaluation Patient Details Name: [name] MRN: [id number] DOB: 1931-04-20 Today's Date: 05/25/2018   History of Present Illness  Pt is an 85 y/o male admitted secondary to acute onset R sided weakness. MRI revealed multifocal acute ischemia with L MCA territory. PMH including but not limited to vasculopathic hx (including AAA, hx of CABG, MI, CHF, and carotid stenosis), Type 2 Diabetes, hx of stroke, HTN, HLD, CKD, and chronic anemia.    Clinical Impression  Pt presented supine in bed with HOB elevated, awake and willing to participate in therapy session. Prior to admission, pt reported that he was independent with all functional mobility and ADLs. Pt stated that he lives with his wife and mother-in-law who he is the primary caregiver for both. Pt has a daughter that can assist as needed as well. Pt currently min A overall for transfers and ambulation secondary to balance and safety deficits. Pt would continue to benefit from skilled physical therapy services at this time while admitted and after d/c to address the below listed limitations in order to improve overall safety and independence with functional mobility.     Follow Up Recommendations CIR    Equipment Recommendations  None recommended by PT    Recommendations for Other Services Rehab consult     Precautions / Restrictions Precautions Precautions: Fall Restrictions Weight Bearing Restrictions: No      Mobility  Bed Mobility Overal bed mobility: Modified Independent                Transfers Overall transfer level: Needs assistance Equipment used: None Transfers: Sit to/from Stand Sit to Stand: Min assist         General transfer comment: min A for stability with transition as pt slightly unsteady initially  Ambulation/Gait Ambulation/Gait assistance: Min assist Gait Distance (Feet): 100 Feet Assistive device: 1 person hand held assist Gait Pattern/deviations: Step-through  pattern;Decreased stride length;Drifts right/left Gait velocity: decreased   General Gait Details: pt with consistent R sided drift with no awareness, bumping into objects on his R side throughout. Overall min A for stability and safety throughout  Stairs            Wheelchair Mobility    Modified Rankin (Stroke Patients Only) Modified Rankin (Stroke Patients Only) Pre-Morbid Rankin Score: No symptoms Modified Rankin: Moderate disability     Balance Overall balance assessment: Needs assistance Sitting-balance support: Feet supported Sitting balance-Leahy Scale: Good Sitting balance - Comments: pt able to don socks seated EOB with supervision   Standing balance support: No upper extremity supported;Single extremity supported Standing balance-Leahy Scale: Poor                               Pertinent Vitals/Pain Pain Assessment: No/denies pain    Home Living Family/patient expects to be discharged to:: Private residence Living Arrangements: Spouse/significant other;Parent Available Help at Discharge: Family Type of Home: House Home Access: Ramped entrance     Home Layout: Two level Home Equipment: Shower seat;Cane - single point;Crutches;Walker - 2 wheels;Wheelchair - manual      Prior Function Level of Independence: Independent               Hand Dominance   Dominant Hand: Left    Extremity/Trunk Assessment   Upper Extremity Assessment Upper Extremity Assessment: Defer to OT evaluation;RUE deficits/detail RUE Deficits / Details: MMT revealed 2/5 for shoulder flexion, 3/5 for elbow flexion/extension, and greatly decreased grip strength  as compared to L. Pt also with decreased sensation to light touch throughout. RUE Coordination: decreased gross motor;decreased fine motor    Lower Extremity Assessment Lower Extremity Assessment: Overall WFL for tasks assessed    Cervical / Trunk Assessment Cervical / Trunk Assessment: Normal   Communication   Communication: No difficulties  Cognition Arousal/Alertness: Awake/alert Behavior During Therapy: WFL for tasks assessed/performed Overall Cognitive Status: Impaired/Different from baseline Area of Impairment: Memory;Safety/judgement;Problem solving                     Memory: Decreased short-term memory   Safety/Judgement: Decreased awareness of deficits   Problem Solving: Difficulty sequencing;Requires verbal cues        General Comments      Exercises     Assessment/Plan    PT Assessment Patient needs continued PT services  PT Problem List Decreased strength;Decreased activity tolerance;Decreased balance;Decreased coordination;Decreased mobility;Decreased cognition;Decreased knowledge of use of DME;Decreased safety awareness;Decreased knowledge of precautions       PT Treatment Interventions DME instruction;Gait training;Functional mobility training;Therapeutic activities;Stair training;Therapeutic exercise;Balance training;Neuromuscular re-education;Patient/family education    PT Goals (Current goals can be found in the Care Plan section)  Acute Rehab PT Goals Patient Stated Goal: return to independence PT Goal Formulation: With patient Time For Goal Achievement: 06/08/18 Potential to Achieve Goals: Good    Frequency Min 4X/week   Barriers to discharge        Co-evaluation               AM-PAC PT "6 Clicks" Mobility  Outcome Measure Help needed turning from your back to your side while in a flat bed without using bedrails?: None Help needed moving from lying on your back to sitting on the side of a flat bed without using bedrails?: None Help needed moving to and from a bed to a chair (including a wheelchair)?: A Little Help needed standing up from a chair using your arms (e.g., wheelchair or bedside chair)?: A Little Help needed to walk in hospital room?: A Little Help needed climbing 3-5 steps with a railing? : A Lot 6 Click  Score: 19    End of Session Equipment Utilized During Treatment: Gait belt Activity Tolerance: Patient tolerated treatment well Patient left: in chair;with call bell/phone within reach;with chair alarm set Nurse Communication: Mobility status PT Visit Diagnosis: Other abnormalities of gait and mobility (R26.89);Other symptoms and signs involving the nervous system (R29.898)    Time: 3546-5681 PT Time Calculation (min) (ACUTE ONLY): 18 min   Charges:   PT Evaluation $PT Eval Moderate Complexity: 1 Mod          Sherie Don, PT, DPT  Acute Rehabilitation Services Pager [phone] Office [place] 05/25/2018, 10:16 AM

## 2018-05-25 NOTE — Consult Note (Signed)
Physical Medicine and Rehabilitation Consult Reason for Consult: Right side weakness Referring Physician: [facility]   HPI: [name] is a 85 y.o. right-handed male with history of AAA, CAD with CABG, diastolic congestive heart failure, suprapubic catheter 1 year, diabetes mellitus, CVA 2014 with mild right side weakness maintained on aspirin, chronic renal insufficiency. Per chart review and patient, patient lives with spouse. 2 level home with ramped entrance. 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 He has a daughter approximately 40 miles away and works every other day. Presented on 12/05/2018 with right side weakness. Cranial CT scan reviewed, unremarkable for acute intracranial process. Per report, stable small chronic left frontoparietal infarction. CT angiogram of head and neck with no emergent large vessel occlusion. 60% stenosis of proximal left ICA. MRI of the brain multifocal acute ischemia within the left MCA territory as well as additional small focus of ischemia in the right frontal white matter. Carotid Dopplers with no ICA stenosis. Echocardiogram pending. Therapy evaluation completed with recommendations of physical medicine rehabilitation consult.  Review of Systems  Constitutional: Negative for fever.  HENT: Negative for hearing loss.   Eyes: Negative for blurred vision and double vision.  Cardiovascular: Negative for chest pain and palpitations.  Gastrointestinal: Positive for constipation. Negative for nausea and vomiting.  Genitourinary: Negative for dysuria and hematuria.  Musculoskeletal: Positive for joint pain and myalgias.  Skin: Negative for rash.  Neurological: Positive for dizziness, sensory change and focal weakness.  All other systems reviewed and are negative.  Past Medical History:  Diagnosis Date  . Abdominal aortic aneurysm ([place])    a. Aortic duplex 06/2014: mild aneurysmal dilatation of  proximal abdominal aorta measuring 3.4x3.4cm. No sig change from 2012. F/u due 06/2016;   . AKI (acute kidney injury) ([place]) 03/28/2016  . Arthritis    "right knee; never bothered me" (03/26/2016)  . 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 CAD in native artery    a. NSTEMI 11/2010 - CABG x2(LIMA to LAD, SVG to PDA). b. NEG Lexi MV 10/24/13, EF 53%, no perfusion abnormality, septal and apical HK noted. c. NSTEMI 05/2014 - s/p DES to SVG-RPDA 06/18/14 (Xience Alpine DES 3.0 x 18 mm -3.35 mm), EF 60-65; d. 03/2016 STEMI/PCI: LM nl, ost LAD 70%, mLCx 50%, pRCA 95% - mRCA 60%, dRPDA 70%, LIMA->LAD ok, o-p SVG->RPDA 100% (3.0x16 Promus DES overlapping prior stent).  . Diastolic dysfunction    a. 03/2016 Echo: EF 60-65%, no rwma, Gr1 DD, triv AI, Ao root 108mm, Asc Ao 19mm, triv MR.  . Dizziness    a. Carotid duplex 03/2014: mild fibrous plaque, no significant stenosis.  . Foley catheter in place    "been wearing it for a couple months now" (03/26/2016)  . HTN (hypertension)   . Hyperlipidemia LDL goal <70   . Non-STEMI (non-ST elevated myocardial infarction) ([place]) 11/2010; 03/2018   1. Ostial LAD 70% (to close to [facility] for PCI), subtotal occlusion of the RCA; 2.  In-stent restenosis SVG-RCA -PTCA.  Marland Kitchen Postoperative atrial fibrillation ([place]) 11/2010   Post CABG, no sign recurrence  . Refusal of blood transfusions as patient is Jehovah's Witness   . 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 Servando Snare)  . Sleep apnea    Not on CPAP. (03/26/2016)  . ST elevation myocardial infarction (STEMI) of inferior wall ([place]) 04/05/2016   Occluded in-stent restenosis/thrombosis of  SVG-RCA --> treated with overlapping Promus DES 3.0 mm 16 mm postdilated 3.0 mm).  . Stroke [facility]) 2014; 01/2015   a. 2014 with mild right hand weakness, nonhemorrhagic per pt.;; b. - PTA-Stent L ICA 95%   . Type II diabetes mellitus ([place])    Past Surgical History:  Procedure Laterality Date  .  APPENDECTOMY    . CARDIAC CATHETERIZATION  12/11/2010   Dr. Chase Picket - subsequent cath - normal LV systolic function, no renal artery stenosis, severe 2-vessel disease with subtotaled RCA prox and distal 60% lesion and complex 70% area of narrowing of ostium of LAD  . CARDIAC CATHETERIZATION N/A 03/26/2016   Procedure: Left Heart Cath and Coronary Angiography;  Surgeon: Nelva Bush, MD;  Location: [place] [facility];  Service: Cardiovascular;  Laterality: N/A;  . CARDIAC CATHETERIZATION N/A 03/26/2016   Procedure: Coronary Stent Intervention;  Surgeon: Nelva Bush, MD;  Location: [place] [facility];  Service: Cardiovascular: 100% In-stent thrombosis of pros SVG-RCA (Xience DES) --> treated with PromusDES 3.0 x 18 (3.3 mm)  . CARDIAC CATHETERIZATION N/A 03/26/2016   Procedure: Bypass Graft Angiography;  Surgeon: Nelva Bush, MD;  Location: [place] [facility];  Service: Cardiovascular;  Laterality: N/A;  . CORONARY ANGIOPLASTY WITH STENT PLACEMENT  06/18/2014   PCI to SVG-RPDA 06/18/14 (Xience Alpine DES 3.0 x 18 mm -3.35 mm),   . CORONARY ARTERY BYPASS GRAFT  12/15/2010   X2, Dr Servando Snare; LIMA to LAD, SVG to PDA;   . CORONARY BALLOON ANGIOPLASTY N/A 04/18/2018   Procedure: CORONARY BALLOON ANGIOPLASTY;  Surgeon: Leonie Man, MD;  Location: [place] [facility];  Service: Cardiovascular;;; high pressure scoring and noncompliant balloon PTCA of SVG-RCA ISR ostial and proximal  . CORONARY/GRAFT ANGIOGRAPHY N/A 08/27/2017   Procedure: CORONARY/GRAFT ANGIOGRAPHY;  Surgeon: Nelva Bush, MD;  Location: [place] [facility];  Service: Cardiovascular;; pLAD 70%, ostD1 50%.  mCx 60%, OM2 80%. pRCA 95% & mRCA 100% - rPDA 70%. LIMA-mLAD patent. SVG-rPDA 10% ISR.   Marland Kitchen CYSTOSCOPY WITH URETHRAL DILATATION    . IR ANGIO INTRA EXTRACRAN SEL COM CAROTID INNOMINATE BILAT MOD SED  02/25/2017  . IR ANGIO INTRA EXTRACRAN SEL COM CAROTID INNOMINATE BILAT MOD SED  10/19/2017   Dr. Kathee Delton: L Common Carotid -  ECA & major branches widely patent. ICA ~20% distal to bulb & 50% in supraclinoid segment. LMCA-distal 1/3 MI ~905 stenosis with post-stenotic dilation into inferior division. ~50% prox Basilar A stenosis @ anterior Inf Cerebellar A. 50% R ICA  . IR ANGIO VERTEBRAL SEL SUBCLAVIAN INNOMINATE UNI L MOD SED  02/25/2017  . IR ANGIO VERTEBRAL SEL SUBCLAVIAN INNOMINATE UNI R MOD SED  10/19/2017  . IR ANGIO VERTEBRAL SEL VERTEBRAL UNI L MOD SED  10/19/2017  . IR GENERIC HISTORICAL  01/21/2016   IR RADIOLOGIST EVAL & MGMT 01/21/2016 [facility]  . IR GENERIC HISTORICAL  02/03/2016   IR CATHETER TUBE CHANGE 02/03/2016 Marybelle Killings, MD [facility]  . IR RADIOLOGIST EVAL & MGMT  11/09/2017  . LEFT HEART CATH AND CORONARY ANGIOGRAPHY N/A 04/18/2018   Procedure: LEFT HEART CATH AND CORONARY ANGIOGRAPHY;  Surgeon: Leonie Man, MD;  Location: [place] [facility];  Service: Cardiovascular;  Laterality: N/A; stable findings on last cath with exception of 75% in-stent restenosis of SVG-RCA ostial stent treated with PTCA.    Marland Kitchen LEFT HEART CATHETERIZATION WITH CORONARY ANGIOGRAM N/A 06/18/2014   Procedure: LEFT HEART CATHETERIZATION WITH CORONARY ANGIOGRAM;  Surgeon: Leonie Man, MD;  Location: [place] [facility];  Service: Cardiovascular;  -- severe disease of SVG-rPDA  . RADIOLOGY WITH ANESTHESIA N/A 01/24/2015   Procedure: STENT ASSISTED ANGIOPLASTY (RADIOLOGY WITH ANESTHESIA);  Surgeon: Luanne Bras, MD;  Location: [place];  Service: Radiology;  Laterality: N/A;  . TONSILLECTOMY    . TRANSTHORACIC ECHOCARDIOGRAM  08/2017; 04/2018:    A) EF 60-65%. Mild LVH. No RWMA. Gr 1 DD. Mod LA dilation.;  B)  EF 35-40%.  Severe LVH.  GRII DD.  Apical anteroseptal hypokinesis.  Akinesis of the apex.   Family History  Problem Relation Age of Onset  . Heart Problems Father 8   Social History:  reports that he quit smoking about 59 years ago. His smoking use included cigarettes. He has a 3.30 pack-year smoking history. He has never  used smokeless tobacco. He reports that he does not drink alcohol or use drugs. Allergies:  Allergies  Allergen Reactions  . Other Other (See Comments)    No  BLOOD PRODUCTS - Pt is Jehovah's Witness  . Statins Other (See Comments)    Cause muscle aches  . Atorvastatin Other (See Comments)    Myalgias  . Crestor [Rosuvastatin] Other (See Comments)    Myalgias   . Pravastatin Other (See Comments)    Myalgias  . Simvastatin Other (See Comments)    Myalgias    Medications Prior to Admission  Medication Sig Dispense Refill  . amLODipine (NORVASC) 10 MG tablet Take 10 mg by mouth every evening.     Marland Kitchen aspirin 81 MG chewable tablet Chew 1 tablet (81 mg total) by mouth daily.    . carvedilol (COREG) 6.25 MG tablet Take 0.5 tablets (3.125 mg total) by mouth every morning AND 1 tablet (6.25 mg total) every evening. (Patient taking differently: Take 3.125 mg by mouth in the morning and 6.25 mg in the evening) 135 tablet 3  . ciprofloxacin (CIPRO) 500 MG tablet Take 500 mg by mouth 2 (two) times daily. FOR 10 DAYS    . clopidogrel (PLAVIX) 75 MG tablet Take 1 tablet (75 mg total) by mouth daily. 90 tablet 3  . doxazosin (CARDURA) 1 MG tablet Take 1 tablet (1 mg total) by mouth daily. 30 tablet 0  . Ferrous Sulfate Dried (FERROUS SULFATE IRON) 200 (65 Fe) MG TABS Take 1 tablet by mouth 2 (two) times daily after a meal.    . furosemide (LASIX) 20 MG tablet Take 1 tablet (20 mg total) by mouth daily. 30 tablet 0  . glimepiride (AMARYL) 4 MG tablet Take 4 mg by mouth 2 (two) times daily with a meal.     . isosorbide mononitrate (IMDUR) 60 MG 24 hr tablet Take 1 tablet (60 mg total) by mouth 2 (two) times daily. 60 tablet 0  . metFORMIN (GLUCOPHAGE) 1000 MG tablet Take 1 tablet (1,000 mg total) by mouth 2 (two) times daily.    . nitroGLYCERIN (NITROSTAT) 0.4 MG SL tablet Place 1 tablet (0.4 mg total) under the tongue every 5 (five) minutes as needed for chest pain (up to 3 doses). (Patient taking  differently: Place 0.4 mg under the tongue every 5 (five) minutes x 3 doses as needed for chest pain. ) 25 tablet 3  . oxybutynin (DITROPAN) 5 MG tablet Take 5 mg by mouth daily as needed for bladder spasms.     . carvedilol (COREG) 12.5 MG tablet Take 6.25 mg by mouth 2 (two) times daily with a meal.    . hydrALAZINE (APRESOLINE) 100 MG tablet  Take 1 tablet (100 mg total) by mouth 2 (two) times daily. DISCONTINUE PERVIOUS DOSE  PATIENT WILL CALL WHEN REFILL IS NEEDED (Patient not taking: Reported on 05/24/2018) 180 tablet 3    Home: Home Living Family/patient expects to be discharged to:: Private residence Living Arrangements: Spouse/significant other, Parent Available Help at Discharge: Family Type of Home: House Home Access: Ramped entrance Home Layout: Two level Alternate Level Stairs-Number of Steps: flight Alternate Level Stairs-Rails: Right Bathroom Shower/Tub: Tub/shower unit, Multimedia programmer: Handicapped height Home Equipment: Civil engineer, contracting, Radio producer - single point, Crutches, Environmental consultant - 2 wheels, Wheelchair - manual  Functional History: Prior Function Level of Independence: Independent Comments: independent, driving, and managing medication; 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  Functional Status:  Mobility: Bed Mobility Overal bed mobility: Modified Independent General bed mobility comments: seated OOB in recliner upon entry Transfers Overall transfer level: Needs assistance Equipment used: None Transfers: Sit to/from Stand Sit to Stand: Min assist General transfer comment: min A for stability with transition as pt slightly unsteady initially Ambulation/Gait Ambulation/Gait assistance: Min assist Gait Distance (Feet): 100 Feet Assistive device: 1 person hand held assist Gait Pattern/deviations: Step-through pattern, Decreased stride length, Drifts right/left General Gait Details: pt with consistent R sided drift with no awareness, bumping into objects on his R  side throughout. Overall min A for stability and safety throughout Gait velocity: decreased    ADL: ADL Overall ADL's : Needs assistance/impaired Grooming: Minimal assistance, Standing, Wash/dry hands Upper Body Bathing: Minimal assistance, Sitting Lower Body Bathing: Minimal assistance, Sit to/from stand Upper Body Dressing : Minimal assistance, Sitting Lower Body Dressing: Sit to/from stand, Minimal assistance Toilet Transfer: Minimal assistance, Ambulation Toilet Transfer Details (indicate cue type and reason): simulated in room Toileting- Clothing Manipulation and Hygiene: Moderate assistance, Sit to/from stand Functional mobility during ADLs: Minimal assistance General ADL Comments: patient demonstrates decreased functional use of R UE, affecting independence with daily activities   Cognition: Cognition Overall Cognitive Status: Impaired/Different from baseline Orientation Level: Oriented X4 Cognition Arousal/Alertness: Awake/alert Behavior During Therapy: WFL for tasks assessed/performed Overall Cognitive Status: Impaired/Different from baseline Area of Impairment: Memory, Safety/judgement, Problem solving Memory: Decreased short-term memory Safety/Judgement: Decreased awareness of deficits Problem Solving: Difficulty sequencing, Requires verbal cues  Blood pressure (!) 176/78, pulse 65, temperature 98.6 F (37 C), resp. rate 18, height 6\' 1"  (1.854 m), weight 98.1 kg, SpO2 98 %. Physical Exam  Vitals reviewed. Constitutional: He is oriented to person, place, and time. He appears well-developed and well-nourished.  HENT:  Head: Normocephalic and atraumatic.  Eyes: EOM are normal. Right eye exhibits no discharge. Left eye exhibits no discharge.  Neck: Normal range of motion. Neck supple. No thyromegaly present.  Cardiovascular: Normal rate and regular rhythm.  Respiratory: Effort normal and breath sounds normal. No respiratory distress.  GI: Soft. Bowel sounds are  normal. He exhibits no distension.  Genitourinary:    Genitourinary Comments: Suprapubic tube in place   Musculoskeletal:     Comments: No edema or tenderness in extremities  Neurological: He is alert and oriented to person, place, and time.  Motor: RUE: 4+/5 proximal to distal LUE: 4-/5 proximal to distal with apraxia RLE: HF 4-/5, KE 4/5, ADF 5/5 LLE: HF 3/5, KE 3/5, ADF 4+/5 Sensation diminished to light touch RUE  Skin: Skin is warm and dry.  Psychiatric: He has a normal mood and affect. His behavior is normal.   Results for orders placed or performed during the hospital encounter of 05/24/18 (from the past  24 hour(s))  CBG monitoring, ED     Status: Abnormal   Collection Time: 05/24/18  1:58 PM  Result Value Ref Range   Glucose-Capillary 59 (L) 70 - 99 mg/dL  Ethanol     Status: None   Collection Time: 05/24/18  2:06 PM  Result Value Ref Range   Alcohol, Ethyl (B) <10 <10 mg/dL  Protime-INR     Status: None   Collection Time: 05/24/18  2:06 PM  Result Value Ref Range   Prothrombin Time 14.3 11.4 - 15.2 seconds   INR 1.11   APTT     Status: None   Collection Time: 05/24/18  2:06 PM  Result Value Ref Range   aPTT 32 24 - 36 seconds  CBC     Status: Abnormal   Collection Time: 05/24/18  2:06 PM  Result Value Ref Range   WBC 5.3 4.0 - 10.5 K/uL   RBC 3.15 (L) 4.22 - 5.81 MIL/uL   Hemoglobin 8.4 (L) 13.0 - 17.0 g/dL   HCT 28.1 (L) 39.0 - 52.0 %   MCV 89.2 80.0 - 100.0 fL   MCH 26.7 26.0 - 34.0 pg   MCHC 29.9 (L) 30.0 - 36.0 g/dL   RDW 18.5 (H) 11.5 - 15.5 %   Platelets 229 150 - 400 K/uL   nRBC 0.0 0.0 - 0.2 %  Differential     Status: Abnormal   Collection Time: 05/24/18  2:06 PM  Result Value Ref Range   Neutrophils Relative % 48 %   Neutro Abs 2.6 1.7 - 7.7 K/uL   Lymphocytes Relative 28 %   Lymphs Abs 1.5 0.7 - 4.0 K/uL   Monocytes Relative 18 %   Monocytes Absolute 0.9 0.1 - 1.0 K/uL   Eosinophils Relative 2 %   Eosinophils Absolute 0.1 0.0 - 0.5 K/uL    Basophils Relative 1 %   Basophils Absolute 0.0 0.0 - 0.1 K/uL   Immature Granulocytes 3 %   Abs Immature Granulocytes 0.17 (H) 0.00 - 0.07 K/uL  Comprehensive metabolic panel     Status: Abnormal   Collection Time: 05/24/18  2:06 PM  Result Value Ref Range   Sodium 140 135 - 145 mmol/L   Potassium 4.0 3.5 - 5.1 mmol/L   Chloride 109 98 - 111 mmol/L   CO2 23 22 - 32 mmol/L   Glucose, Bld 67 (L) 70 - 99 mg/dL   BUN 17 8 - 23 mg/dL   Creatinine, Ser 1.41 (H) 0.61 - 1.24 mg/dL   Calcium 8.9 8.9 - 10.3 mg/dL   Total Protein 6.6 6.5 - 8.1 g/dL   Albumin 3.4 (L) 3.5 - 5.0 g/dL   AST 15 15 - 41 U/L   ALT 9 0 - 44 U/L   Alkaline Phosphatase 55 38 - 126 U/L   Total Bilirubin 0.5 0.3 - 1.2 mg/dL   GFR calc non Af Amer 45 (L) >60 mL/min   GFR calc Af Amer 52 (L) >60 mL/min   Anion gap 8 5 - 15  I-Stat Chem 8, ED     Status: Abnormal   Collection Time: 05/24/18  2:12 PM  Result Value Ref Range   Sodium 141 135 - 145 mmol/L   Potassium 4.0 3.5 - 5.1 mmol/L   Chloride 107 98 - 111 mmol/L   BUN 19 8 - 23 mg/dL   Creatinine, Ser 1.40 (H) 0.61 - 1.24 mg/dL   Glucose, Bld 64 (L) 70 - 99 mg/dL   Calcium, Ion  1.19 1.15 - 1.40 mmol/L   TCO2 24 22 - 32 mmol/L   Hemoglobin 9.2 (L) 13.0 - 17.0 g/dL   HCT 27.0 (L) 39.0 - 52.0 %  Urine rapid drug screen (hosp performed)     Status: None   Collection Time: 05/24/18  2:47 PM  Result Value Ref Range   Opiates NONE DETECTED NONE DETECTED   Cocaine NONE DETECTED NONE DETECTED   Benzodiazepines NONE DETECTED NONE DETECTED   Amphetamines NONE DETECTED NONE DETECTED   Tetrahydrocannabinol NONE DETECTED NONE DETECTED   Barbiturates NONE DETECTED NONE DETECTED  Urinalysis, Routine w reflex microscopic     Status: Abnormal   Collection Time: 05/24/18  2:47 PM  Result Value Ref Range   Color, Urine STRAW (A) YELLOW   APPearance CLEAR CLEAR   Specific Gravity, Urine 1.012 1.005 - 1.030   pH 7.0 5.0 - 8.0   Glucose, UA NEGATIVE NEGATIVE mg/dL   Hgb  urine dipstick NEGATIVE NEGATIVE   Bilirubin Urine NEGATIVE NEGATIVE   Ketones, ur NEGATIVE NEGATIVE mg/dL   Protein, ur 100 (A) NEGATIVE mg/dL   Nitrite NEGATIVE NEGATIVE   Leukocytes, UA TRACE (A) NEGATIVE   RBC / HPF 0-5 0 - 5 RBC/hpf   WBC, UA 6-10 0 - 5 WBC/hpf   Bacteria, UA RARE (A) NONE SEEN   Mucus PRESENT    Hyaline Casts, UA PRESENT   Urine culture     Status: None   Collection Time: 05/24/18  2:47 PM  Result Value Ref Range   Specimen Description URINE, RANDOM    Special Requests NONE    Culture      NO GROWTH Performed at [facility], [address], [place], [place] [address]    Report Status 05/25/2018 FINAL   CBG monitoring, ED     Status: None   Collection Time: 05/24/18  2:49 PM  Result Value Ref Range   Glucose-Capillary 97 70 - 99 mg/dL  CBG monitoring, ED     Status: Abnormal   Collection Time: 05/24/18  5:08 PM  Result Value Ref Range   Glucose-Capillary 52 (L) 70 - 99 mg/dL  Glucose, capillary     Status: None   Collection Time: 05/24/18  6:28 PM  Result Value Ref Range   Glucose-Capillary 96 70 - 99 mg/dL   Comment 1 Notify RN    Comment 2 Document in Chart   Glucose, capillary     Status: None   Collection Time: 05/24/18  9:28 PM  Result Value Ref Range   Glucose-Capillary 88 70 - 99 mg/dL  Basic metabolic panel     Status: Abnormal   Collection Time: 05/25/18  4:36 AM  Result Value Ref Range   Sodium 140 135 - 145 mmol/L   Potassium 3.3 (L) 3.5 - 5.1 mmol/L   Chloride 108 98 - 111 mmol/L   CO2 24 22 - 32 mmol/L   Glucose, Bld 68 (L) 70 - 99 mg/dL   BUN 15 8 - 23 mg/dL   Creatinine, Ser 1.33 (H) 0.61 - 1.24 mg/dL   Calcium 8.6 (L) 8.9 - 10.3 mg/dL   GFR calc non Af Amer 48 (L) >60 mL/min   GFR calc Af Amer 56 (L) >60 mL/min   Anion gap 8 5 - 15  CBC     Status: Abnormal   Collection Time: 05/25/18  4:36 AM  Result Value Ref Range   WBC 4.8 4.0 - 10.5 K/uL   RBC 2.82 (L) 4.22 -  5.81 MIL/uL   Hemoglobin 7.9 (L) 13.0 - 17.0 g/dL     HCT 24.9 (L) 39.0 - 52.0 %   MCV 88.3 80.0 - 100.0 fL   MCH 28.0 26.0 - 34.0 pg   MCHC 31.7 30.0 - 36.0 g/dL   RDW 18.4 (H) 11.5 - 15.5 %   Platelets 221 150 - 400 K/uL   nRBC 0.0 0.0 - 0.2 %  Glucose, capillary     Status: None   Collection Time: 05/25/18  7:50 AM  Result Value Ref Range   Glucose-Capillary 77 70 - 99 mg/dL   Comment 1 Procedure Error   Glucose, capillary     Status: Abnormal   Collection Time: 05/25/18 11:48 AM  Result Value Ref Range   Glucose-Capillary 204 (H) 70 - 99 mg/dL   Comment 1 Notify RN    Comment 2 Document in Chart    Ct Angio Head W Or Wo Contrast  Result Date: 05/24/2018 CLINICAL DATA:  Slurred speech and right facial droop EXAM: CT ANGIOGRAPHY HEAD AND NECK TECHNIQUE: Multidetector CT imaging of the head and neck was performed using the standard protocol during bolus administration of intravenous contrast. Multiplanar CT image reconstructions and MIPs were obtained to evaluate the vascular anatomy. Carotid stenosis measurements (when applicable) are obtained utilizing NASCET criteria, using the distal internal carotid diameter as the denominator. CONTRAST:  124mL ISOVUE-370 IOPAMIDOL (ISOVUE-370) INJECTION 76% COMPARISON:  Head CT 05/24/2018 FINDINGS: CTA NECK FINDINGS SKELETON: Lower cervical degenerative disc disease. No bony spinal canal stenosis. OTHER NECK: Normal pharynx, larynx and major salivary glands. No cervical lymphadenopathy. Unremarkable thyroid gland. UPPER CHEST: No pneumothorax or pleural effusion. No nodules or masses. AORTIC ARCH: There is mild calcific atherosclerosis of the aortic arch. There is no aneurysm, dissection or hemodynamically significant stenosis of the visualized ascending aorta and aortic arch. Conventional 3 vessel aortic branching pattern. The visualized proximal subclavian arteries are widely patent. RIGHT CAROTID SYSTEM: --Common carotid artery: Widely patent origin without common carotid artery dissection or aneurysm.  --Internal carotid artery: No dissection, occlusion or aneurysm. Mild atherosclerotic calcification at the carotid bifurcation without hemodynamically significant stenosis. --External carotid artery: No acute abnormality. LEFT CAROTID SYSTEM: --Common carotid artery: Widely patent origin without common carotid artery dissection or aneurysm. --Internal carotid artery: No dissection, occlusion or aneurysm. There is mixed density atherosclerosis extending into the proximal ICA, resulting in 60% stenosis. --External carotid artery: No acute abnormality. VERTEBRAL ARTERIES: Left dominant configuration. Both origins are normal. The right vertebral artery is diminutive along its entire course with attenuated contrast-enhancement. There is complete loss of opacification beyond the proximal V3 segment. The dominant left vertebral artery is widely patent along its entire course. There is atherosclerotic calcification of the V4 segment without hemodynamically significant stenosis. CTA HEAD FINDINGS POSTERIOR CIRCULATION: --Basilar artery: Normal. --Posterior cerebral arteries: Normal. The right PCA is predominantly supplied by the posterior communicating artery. --Superior cerebellar arteries: Normal. --Inferior cerebellar arteries: Normal AICA bilaterally. The right PICA is supplied from the left PICA. ANTERIOR CIRCULATION: --Intracranial internal carotid arteries: Moderate atherosclerotic calcification. --Anterior cerebral arteries: Normal. Both A1 segments are present. Patent anterior communicating artery. --Middle cerebral arteries: There is severe stenosis of the distal left M1 segment, which is otherwise normal caliber distally. The right middle cerebral artery is normal. --Posterior communicating arteries: Right p-comm gives rise to the right PCA. No left p-comm is visualized. VENOUS SINUSES: As permitted by contrast timing, patent. ANATOMIC VARIANTS: Fetal origin of the right posterior cerebral artery. DELAYED PHASE:  Not performed Review of the MIP images confirms the above findings. IMPRESSION: 1. No emergent large vessel occlusion. 2. Severe stenosis of the distal left M1 segment, with preserved distal patency. 3. Attenuated appearance of the right vertebral artery with complete loss of patency at the proximal V3 segment. Right PICA supplied via collaterals from left PICA. 4. 60% stenosis of the proximal left internal carotid artery. These results were communicated to Dr. Karena Addison Aroor at 2:52 pm on 05/24/2018 by text page via the [facility] messaging system. Electronically Signed   By: Ulyses Jarred M.D.   On: 05/24/2018 14:51   Ct Angio Neck W Or Wo Contrast  Result Date: 05/24/2018 CLINICAL DATA:  Slurred speech and right facial droop EXAM: CT ANGIOGRAPHY HEAD AND NECK TECHNIQUE: Multidetector CT imaging of the head and neck was performed using the standard protocol during bolus administration of intravenous contrast. Multiplanar CT image reconstructions and MIPs were obtained to evaluate the vascular anatomy. Carotid stenosis measurements (when applicable) are obtained utilizing NASCET criteria, using the distal internal carotid diameter as the denominator. CONTRAST:  142mL ISOVUE-370 IOPAMIDOL (ISOVUE-370) INJECTION 76% COMPARISON:  Head CT 05/24/2018 FINDINGS: CTA NECK FINDINGS SKELETON: Lower cervical degenerative disc disease. No bony spinal canal stenosis. OTHER NECK: Normal pharynx, larynx and major salivary glands. No cervical lymphadenopathy. Unremarkable thyroid gland. UPPER CHEST: No pneumothorax or pleural effusion. No nodules or masses. AORTIC ARCH: There is mild calcific atherosclerosis of the aortic arch. There is no aneurysm, dissection or hemodynamically significant stenosis of the visualized ascending aorta and aortic arch. Conventional 3 vessel aortic branching pattern. The visualized proximal subclavian arteries are widely patent. RIGHT CAROTID SYSTEM: --Common carotid artery: Widely patent origin without  common carotid artery dissection or aneurysm. --Internal carotid artery: No dissection, occlusion or aneurysm. Mild atherosclerotic calcification at the carotid bifurcation without hemodynamically significant stenosis. --External carotid artery: No acute abnormality. LEFT CAROTID SYSTEM: --Common carotid artery: Widely patent origin without common carotid artery dissection or aneurysm. --Internal carotid artery: No dissection, occlusion or aneurysm. There is mixed density atherosclerosis extending into the proximal ICA, resulting in 60% stenosis. --External carotid artery: No acute abnormality. VERTEBRAL ARTERIES: Left dominant configuration. Both origins are normal. The right vertebral artery is diminutive along its entire course with attenuated contrast-enhancement. There is complete loss of opacification beyond the proximal V3 segment. The dominant left vertebral artery is widely patent along its entire course. There is atherosclerotic calcification of the V4 segment without hemodynamically significant stenosis. CTA HEAD FINDINGS POSTERIOR CIRCULATION: --Basilar artery: Normal. --Posterior cerebral arteries: Normal. The right PCA is predominantly supplied by the posterior communicating artery. --Superior cerebellar arteries: Normal. --Inferior cerebellar arteries: Normal AICA bilaterally. The right PICA is supplied from the left PICA. ANTERIOR CIRCULATION: --Intracranial internal carotid arteries: Moderate atherosclerotic calcification. --Anterior cerebral arteries: Normal. Both A1 segments are present. Patent anterior communicating artery. --Middle cerebral arteries: There is severe stenosis of the distal left M1 segment, which is otherwise normal caliber distally. The right middle cerebral artery is normal. --Posterior communicating arteries: Right p-comm gives rise to the right PCA. No left p-comm is visualized. VENOUS SINUSES: As permitted by contrast timing, patent. ANATOMIC VARIANTS: Fetal origin of the  right posterior cerebral artery. DELAYED PHASE: Not performed Review of the MIP images confirms the above findings. IMPRESSION: 1. No emergent large vessel occlusion. 2. Severe stenosis of the distal left M1 segment, with preserved distal patency. 3. Attenuated appearance of the right vertebral artery with complete loss of patency at the proximal V3 segment. Right PICA  supplied via collaterals from left PICA. 4. 60% stenosis of the proximal left internal carotid artery. These results were communicated to Dr. Karena Addison Aroor at 2:52 pm on 05/24/2018 by text page via the [facility] messaging system. Electronically Signed   By: Ulyses Jarred M.D.   On: 05/24/2018 14:51   Mr Brain Wo Contrast  Result Date: 05/24/2018 CLINICAL DATA:  Right facial droop EXAM: MRI HEAD WITHOUT CONTRAST TECHNIQUE: Multiplanar, multiecho pulse sequences of the brain and surrounding structures were obtained without intravenous contrast. COMPARISON:  CTA head neck 05/24/2018 FINDINGS: BRAIN: There is multifocal cortical and subcortical diffusion restriction within the left MCA territory. Additionally, there is a focus of abnormal diffusion restriction in the right frontal white matter. The midline structures are normal. No midline shift or other mass effect. There are old bilateral lacunar infarcts. There is an old left frontoparietal infarct. Multifocal periventricular white matter hyperintensity, most often a result of chronic microvascular ischemia. Generalized atrophy without lobar predilection. Susceptibility-sensitive sequences show no chronic microhemorrhage or superficial siderosis. VASCULAR: Major intracranial arterial and venous sinus flow voids are normal. SKULL AND UPPER CERVICAL SPINE: Calvarial bone marrow signal is normal. There is no skull base mass. Visualized upper cervical spine and soft tissues are normal. SINUSES/ORBITS: No fluid levels or advanced mucosal thickening. No mastoid or middle ear effusion. The orbits are normal.  IMPRESSION: 1. Multifocal acute ischemia within the left MCA territory, predominantly cortical and subcortical and near the watershed zones at the ACA/MCA and MCA/PCA junctions. These could be watershed or embolic infarcts. 2. Additional small focus of ischemia in the right frontal white matter, more suggestive of embolic etiology. 3. Chronic ischemic microangiopathy. 4. No hemorrhage or mass effect. Electronically Signed   By: Ulyses Jarred M.D.   On: 05/24/2018 21:05   Ct Head Code Stroke Wo Contrast  Result Date: 05/24/2018 CLINICAL DATA:  Code stroke. 85 y/o M; slurred speech and right facial droop. EXAM: CT HEAD WITHOUT CONTRAST TECHNIQUE: Contiguous axial images were obtained from the base of the skull through the vertex without intravenous contrast. COMPARISON:  12/13/2017 CT head. FINDINGS: Brain: No evidence of acute infarction, hemorrhage, hydrocephalus, extra-axial collection or mass lesion/mass effect. Stable small chronic left frontoparietal infarct and left lentiform nucleus lacunar infarct. Stable nonspecific white matter hypodensities compatible with chronic microvascular ischemic changes. Stable volume loss of the brain. Vascular: Calcific atherosclerosis of carotid siphons. No hyperdense vessel identified. Skull: Normal. Negative for fracture or focal lesion. Sinuses/Orbits: Mild maxillary and ethmoid sinus mucosal thickening. Normal aeration of the mastoid air cells. Other: None. ASPECTS [facility] Stroke Program Early CT Score) - Ganglionic level infarction (caudate, lentiform nuclei, internal capsule, insula, M1-M3 cortex): 7 - Supraganglionic infarction (M4-M6 cortex): 3 Total score (0-10 with 10 being normal): 10 IMPRESSION: 1. No acute intracranial abnormality identified. 2. Stable small chronic left frontoparietal infarct. Stable chronic microvascular ischemic changes and volume loss of the brain. 3. ASPECTS is 10 These results were communicated to Dr. Lorraine Lax at 2:22 pmon 1/7/2020by text  page via the [facility] messaging system. Electronically Signed   By: Kristine Garbe M.D.   On: 05/24/2018 14:25   Vas US Carotid  Result Date: 05/25/2018 Carotid Arterial Duplex Study Indications:       Stroke, Carotid stenosis on left. Other Factors:     Coronary Balloon Angioplasty on 04/18/2018. Limitations:       Vessels tortuosity Comparison Study:  Carotid duplex exam on 03/29/2014 Performing Technologist: Rudell Cobb  Examination Guidelines: A complete evaluation includes B-mode imaging,  spectral Doppler, color Doppler, and power Doppler as needed of all accessible portions of each vessel. Bilateral testing is considered an integral part of a complete examination. Limited examinations for reoccurring indications may be performed as noted.  Right Carotid Findings: +----------+--------+--------+--------+------------------------+--------+           PSV cm/sEDV cm/sStenosisDescribe                Comments +----------+--------+--------+--------+------------------------+--------+ CCA Prox  119     22              focal and calcific      tortuous +----------+--------+--------+--------+------------------------+--------+ CCA Distal69      22                                               +----------+--------+--------+--------+------------------------+--------+ ICA Prox  77      32      1-39%   diffuse and heterogenous         +----------+--------+--------+--------+------------------------+--------+ ICA Mid   58      21                                               +----------+--------+--------+--------+------------------------+--------+ ICA Distal78      30                                               +----------+--------+--------+--------+------------------------+--------+ ECA       96      12                                               +----------+--------+--------+--------+------------------------+--------+  +----------+--------+-------+--------+-------------------+           PSV cm/sEDV cmsDescribeArm Pressure (mmHG) +----------+--------+-------+--------+-------------------+ Subclavian119                                        +----------+--------+-------+--------+-------------------+ +---------+--------+--+--------+-+---------+ VertebralPSV cm/s26EDV cm/s5Antegrade +---------+--------+--+--------+-+---------+  Left Carotid Findings: +----------+--------+--------+--------+------------------------+--------+           PSV cm/sEDV cm/sStenosisDescribe                Comments +----------+--------+--------+--------+------------------------+--------+ CCA Prox  55      15              diffuse and heterogenoustortuous +----------+--------+--------+--------+------------------------+--------+ CCA Distal52      10                                               +----------+--------+--------+--------+------------------------+--------+ ICA Prox  78      26      1-39%   diffuse and calcific             +----------+--------+--------+--------+------------------------+--------+ ICA Mid   89      39                                               +----------+--------+--------+--------+------------------------+--------+  ICA Distal80      31                                               +----------+--------+--------+--------+------------------------+--------+ ECA       79      14                                               +----------+--------+--------+--------+------------------------+--------+ +----------+--------+--------+--------+-------------------+ SubclavianPSV cm/sEDV cm/sDescribeArm Pressure (mmHG) +----------+--------+--------+--------+-------------------+           109                                         +----------+--------+--------+--------+-------------------+ +---------+--------+--+--------+--+---------+ VertebralPSV cm/s65EDV  cm/s22Antegrade +---------+--------+--+--------+--+---------+  Summary: Right Carotid: Velocities in the right ICA are consistent with a 1-39% stenosis. Left Carotid: Velocities in the left ICA are consistent with a 1-39% stenosis. Vertebrals: Bilateral vertebral arteries demonstrate antegrade flow. *See table(s) above for measurements and observations.     Preliminary     Assessment/Plan: Diagnosis: Multifocal acute ischemia within the left MCA territory  Labs and images (see above) independently reviewed.  Records reviewed and summated above. Stroke: Continue secondary stroke prophylaxis and Risk Factor Modification listed below:   Antiplatelet therapy:   Blood Pressure Management:  Continue current medication with prn's with permisive HTN per primary team Statin Agent:   Diabetes management:   Right sided hemiparesis Motor recovery: Fluoxetine  1. Does the need for close, 24 hr/day medical supervision in concert with the patient's rehab needs make it unreasonable for this patient to be served in a less intensive setting? Potentially  2. Co-Morbidities requiring supervision/potential complications: AAA, CAD with CABG (cont meds), diastolic congestive heart failure (monitor for signs/symptoms of fluid overload), suprapubic catheter 1 year, DM (Monitor in accordance with exercise and adjust meds as necessary), CVA 2014 with mild right side weakness maintained, hypokalemia (continue to monitor and replete as necessary), ABLA (repeat labs, transfuse to ensure appropriate perfusion for increased activity tolerance) 3. Due to safety, disease management and patient education, does the patient require 24 hr/day rehab nursing? Potentially 4. Does the patient require coordinated care of a physician, rehab nurse, PT (1-2 hrs/day, 5 days/week) and OT (1-2 hrs/day, 5 days/week) to address physical and functional deficits in the context of the above medical diagnosis(es)? Potentially Addressing deficits  in the following areas: balance, endurance, locomotion, strength, transferring, bathing, dressing, toileting and psychosocial support 5. Can the patient actively participate in an intensive therapy program of at least 3 hrs of therapy per day at least 5 days per week? Yes 6. The potential for patient to make measurable gains while on inpatient rehab is good 7. Anticipated functional outcomes upon discharge from inpatient rehab are supervision  with PT, supervision with OT, n/a with SLP. 8. Estimated rehab length of stay to reach the above functional goals is: 4-7 days. 9. Anticipated D/C setting: Home 10. Anticipated post D/C treatments: HH therapy and Home excercise program 11. Overall Rehab/Functional Prognosis: good  RECOMMENDATIONS: This patient's condition is appropriate for continued rehabilitative care in the following setting: Patient would like to go home ASAP.  Given function of evaluation, believe  this is reasonable with HH.  Recommend PM&R outpatient follow up. Patient has agreed to participate in recommended program. Yes Note that insurance prior authorization may be required for reimbursement for recommended care.  Comment: Rehab Admissions Coordinator to follow up.   I have personally performed a face to face diagnostic evaluation, including, but not limited to relevant history and physical exam findings, of this patient and developed relevant assessment and plan.  Additionally, I have reviewed and concur with the physician assistant's documentation above.   Delice Lesch, MD, ABPMR Lavon Paganini Angiulli, PA-C 05/25/2018

## 2018-05-25 NOTE — Progress Notes (Signed)
Rehab Admissions Coordinator Note:  Patient was screened by Cleatrice Burke for appropriateness for an Inpatient Acute Rehab Consult per PT recs.   At this time, we are recommending Inpatient Rehab consult.  Cleatrice Burke RN MSN 05/25/2018, 10:29 AM  I can be reached at [phone].

## 2018-05-25 NOTE — Progress Notes (Signed)
Carotid duplex exam completed. Please see preliminary notes on CV PROC under chart review. [name] H Emmerich Cryer(RDMS RVT) 05/25/18 8:28 AM

## 2018-05-25 NOTE — Progress Notes (Signed)
Inpatient Diabetes Program Recommendations  AACE/ADA: New Consensus Statement on Inpatient Glycemic Control (2015)  Target Ranges:  Prepandial:   less than 140 mg/dL      Peak postprandial:   less than 180 mg/dL (1-2 hours)      Critically ill patients:  140 - 180 mg/dL   Lab Results  Component Value Date   GLUCAP 77 05/25/2018   HGBA1C 8.2 (H) 04/17/2018    Review of Glycemic Control Results for [name], [name] (MRN [id number]) as of 05/25/2018 09:22  Ref. Range 05/24/2018 14:49 05/24/2018 17:08 05/24/2018 18:28 05/24/2018 21:28 05/25/2018 07:50  Glucose-Capillary Latest Ref Range: 70 - 99 mg/dL 97 52 (L) 96 88 77   Diabetes history: DM2 Outpatient Diabetes medications: Amaryl 4 mg bid + Metformin 500 mg bid Current orders for Inpatient glycemic control: Novolog sensitive tid + hs 0-5 units  Inpatient Diabetes Program Recommendations:   Noted hypoglycemia on admission and and last pm without insulin given. On discharge, please consider D/C of Amaryl with PCP followup and review if Metformin (GFR 45 on admission and now 48) to continue.   Thank you, Nani Gasser. Bula Cavalieri, RN, MSN, CDE  Diabetes Coordinator Inpatient Glycemic Control Team Team Pager [phone] (8am-5pm) 05/25/2018 9:27 AM

## 2018-05-25 NOTE — Progress Notes (Signed)
Subjective: [name] continues to have right upper extremity weakness and decreased sensation unchanged from yesterday. He has not had any new episodes of diaphoresis and denies dizziness and SOB. He has been eating well. States that he has had a previous reaction to multiple statins but is willing to try another one to help decrease his risk of another stroke.  Objective:  Vital signs in last 24 hours: Vitals:   05/25/18 0000 05/25/18 0200 05/25/18 0341 05/25/18 0732  BP: (!) 160/68 (!) 142/60 (!) 158/80 (!) 182/70  Pulse: (!) 55 (!) 58 (!) 57 (!) 56  Resp: 16 16 16 17   Temp: 98.8 F (37.1 C) 98.6 F (37 C) (!) 97.3 F (36.3 C) 97.7 F (36.5 C)  TempSrc: Oral Oral Oral Oral  SpO2: 97% 98% 96% 98%  Weight:      Height:       Physical Exam Vitals signs and nursing note reviewed.  Constitutional:      Comments: Elderly appearing male lying in bed in no acute distress.   Cardiovascular:     Rate and Rhythm: Regular rhythm. Bradycardia present.  Pulmonary:     Comments: Auscultated anteriorly, normal heart sounds, nor respiratory distress. Musculoskeletal:     Comments: 3/5 strength in RUE, decreased sensation in RUE. Normal sensation in all other extremities and face.  5/5 strength in LUE and LE's bilaterally. Dysmetria of RUE (finger-to-nose). Symmetric face without facial droop. Normal strength of facial muscles.     Assessment/Plan:  Principal Problem:   Acute CVA (cerebrovascular accident) ([place]) Active Problems:   Stenosis of left carotid artery   Essential hypertension   CKD (chronic kidney disease) stage 3, GFR 30-59 ml/min (HCC)   Suprapubic catheter (HCC)   Chronic combined systolic and diastolic CHF (congestive heart failure) [facility])  [name] presented with right upper extremity weakness/decreased sensation and altered mental status and found to have a left MCA territory stroke.  He is back to his mental baseline but does continue to have weakness. We will  continue to work up secondary causes of his stroke although it is likely to be due to atherosclerotic disease and known M1 stenosis.  CVA:  - Follow-up Neuro recs. - F/U TTE - Continue ASA 81 mg and Plavix 75 mg QD - PT recommends Inpt rehab consult, will follow up OT rec. Inpt rehab consult placed. - Allow for permissive hypertension up to 220/120 m mercury - No signs of new onset afib or other arhythmia on cardiac monitoring. Will continue to monitor. - He is intolerant to statins.  We will add Crestor 20 mg QD in hopes of decreased reaction. Will monitor for symptoms. -- Ordered TSH and Vit D Levels to see if this could be the cause of statin intolerance.  HTN: Hx of HTN on several home meds (amlodipine 10 mg, carvedilol 6.25 mg, lasix 20 mg, Imdur 60 mg).  Home medications being held to allow for permissive hypertension.  Blood pressures remain within acceptable limits. -Continue to monitor  CAD s/p CABG: Stable - Continue aspirin and plavix per above.  Type 2 Diabetes: Continues to be hypoglycemic most likely d.t poor oral intake. Holding home meds for now. Will cont to monitor.  - Sliding scale insulin  Chronic Anemia: Anemia stable from most recent admission.  - Continue to monitor and continue home ferrous sulfate  CHF: Hx of CHF with EF of  35-40%. Will hold his lasix and home coreg for permissive HTN. Does not appear fluid overloaded  today but will cont to monitor.  CKD: Cr improved from 3 wks ago. Will cont to monitor bmps daily.   UTI: Not likely the cause of his AMS, will cont abx per below and f/u urine culture. -Continue ciprofloxacin 500 mg BID  Dispo: Anticipate discharge in 1-2 days. Will likely go to inpatient rehab upon discharge.  Synetta Shadow, Medical Student 05/25/2018, 11:35 AM   Attestation for Student Documentation:  I personally was present and performed or re-performed the history, physical exam and medical decision-making activities of this  service and have verified that the service and findings are accurately documented in the student's note.  Carroll Sage, MD 05/25/2018, 12:10 PM

## 2018-05-25 NOTE — Progress Notes (Signed)
  Echocardiogram 2D Echocardiogram has been performed.  [name] 05/25/2018, 8:57 AM

## 2018-05-25 NOTE — Progress Notes (Signed)
Patient off unit for procedure.

## 2018-05-25 NOTE — Progress Notes (Addendum)
STROKE TEAM PROGRESS NOTE   INTERVAL HISTORY No family is at the bedside.  He lives w/ a 83 yo male and his 42yo invalid wife. He cares for both of them.   Vitals:   05/25/18 0000 05/25/18 0200 05/25/18 0341 05/25/18 0732  BP: (!) 160/68 (!) 142/60 (!) 158/80 (!) 182/70  Pulse: (!) 55 (!) 58 (!) 57 (!) 56  Resp: 16 16 16 17   Temp: 98.8 F (37.1 C) 98.6 F (37 C) (!) 97.3 F (36.3 C) 97.7 F (36.5 C)  TempSrc: Oral Oral Oral Oral  SpO2: 97% 98% 96% 98%  Weight:      Height:        CBC:  Recent Labs  Lab 05/24/18 1406 05/24/18 1412 05/25/18 0436  WBC 5.3  --  4.8  NEUTROABS 2.6  --   --   HGB 8.4* 9.2* 7.9*  HCT 28.1* 27.0* 24.9*  MCV 89.2  --  88.3  PLT 229  --  563    Basic Metabolic Panel:  Recent Labs  Lab 05/24/18 1406 05/24/18 1412 05/25/18 0436  NA 140 141 140  K 4.0 4.0 3.3*  CL 109 107 108  CO2 23  --  24  GLUCOSE 67* 64* 68*  BUN 17 19 15   CREATININE 1.41* 1.40* 1.33*  CALCIUM 8.9  --  8.6*   Lipid Panel:     Component Value Date/Time   CHOL 140 04/27/2018 0932   TRIG 101 04/27/2018 0932   HDL 39 (L) 04/27/2018 0932   CHOLHDL 3.6 04/27/2018 0932   CHOLHDL 3.5 04/18/2018 0136   VLDL 15 04/18/2018 0136   LDLCALC 81 04/27/2018 0932   HgbA1c:  Lab Results  Component Value Date   HGBA1C 8.2 (H) 04/17/2018   Urine Drug Screen:     Component Value Date/Time   LABOPIA NONE DETECTED 05/24/2018 1447   COCAINSCRNUR NONE DETECTED 05/24/2018 1447   LABBENZ NONE DETECTED 05/24/2018 1447   AMPHETMU NONE DETECTED 05/24/2018 1447   THCU NONE DETECTED 05/24/2018 1447   LABBARB NONE DETECTED 05/24/2018 1447    Alcohol Level     Component Value Date/Time   ETH <10 05/24/2018 1406    IMAGING Ct Angio Head W Or Wo Contrast  Result Date: 05/24/2018 CLINICAL DATA:  Slurred speech and right facial droop EXAM: CT ANGIOGRAPHY HEAD AND NECK TECHNIQUE: Multidetector CT imaging of the head and neck was performed using the standard protocol during bolus  administration of intravenous contrast. Multiplanar CT image reconstructions and MIPs were obtained to evaluate the vascular anatomy. Carotid stenosis measurements (when applicable) are obtained utilizing NASCET criteria, using the distal internal carotid diameter as the denominator. CONTRAST:  113mL ISOVUE-370 IOPAMIDOL (ISOVUE-370) INJECTION 76% COMPARISON:  Head CT 05/24/2018 FINDINGS: CTA NECK FINDINGS SKELETON: Lower cervical degenerative disc disease. No bony spinal canal stenosis. OTHER NECK: Normal pharynx, larynx and major salivary glands. No cervical lymphadenopathy. Unremarkable thyroid gland. UPPER CHEST: No pneumothorax or pleural effusion. No nodules or masses. AORTIC ARCH: There is mild calcific atherosclerosis of the aortic arch. There is no aneurysm, dissection or hemodynamically significant stenosis of the visualized ascending aorta and aortic arch. Conventional 3 vessel aortic branching pattern. The visualized proximal subclavian arteries are widely patent. RIGHT CAROTID SYSTEM: --Common carotid artery: Widely patent origin without common carotid artery dissection or aneurysm. --Internal carotid artery: No dissection, occlusion or aneurysm. Mild atherosclerotic calcification at the carotid bifurcation without hemodynamically significant stenosis. --External carotid artery: No acute abnormality. LEFT CAROTID SYSTEM: --Common carotid artery:  Widely patent origin without common carotid artery dissection or aneurysm. --Internal carotid artery: No dissection, occlusion or aneurysm. There is mixed density atherosclerosis extending into the proximal ICA, resulting in 60% stenosis. --External carotid artery: No acute abnormality. VERTEBRAL ARTERIES: Left dominant configuration. Both origins are normal. The right vertebral artery is diminutive along its entire course with attenuated contrast-enhancement. There is complete loss of opacification beyond the proximal V3 segment. The dominant left vertebral  artery is widely patent along its entire course. There is atherosclerotic calcification of the V4 segment without hemodynamically significant stenosis. CTA HEAD FINDINGS POSTERIOR CIRCULATION: --Basilar artery: Normal. --Posterior cerebral arteries: Normal. The right PCA is predominantly supplied by the posterior communicating artery. --Superior cerebellar arteries: Normal. --Inferior cerebellar arteries: Normal AICA bilaterally. The right PICA is supplied from the left PICA. ANTERIOR CIRCULATION: --Intracranial internal carotid arteries: Moderate atherosclerotic calcification. --Anterior cerebral arteries: Normal. Both A1 segments are present. Patent anterior communicating artery. --Middle cerebral arteries: There is severe stenosis of the distal left M1 segment, which is otherwise normal caliber distally. The right middle cerebral artery is normal. --Posterior communicating arteries: Right p-comm gives rise to the right PCA. No left p-comm is visualized. VENOUS SINUSES: As permitted by contrast timing, patent. ANATOMIC VARIANTS: Fetal origin of the right posterior cerebral artery. DELAYED PHASE: Not performed Review of the MIP images confirms the above findings. IMPRESSION: 1. No emergent large vessel occlusion. 2. Severe stenosis of the distal left M1 segment, with preserved distal patency. 3. Attenuated appearance of the right vertebral artery with complete loss of patency at the proximal V3 segment. Right PICA supplied via collaterals from left PICA. 4. 60% stenosis of the proximal left internal carotid artery. These results were communicated to Dr. Karena Addison Aroor at 2:52 pm on 05/24/2018 by text page via the [facility] messaging system. Electronically Signed   By: Ulyses Jarred M.D.   On: 05/24/2018 14:51   Ct Angio Neck W Or Wo Contrast  Result Date: 05/24/2018 CLINICAL DATA:  Slurred speech and right facial droop EXAM: CT ANGIOGRAPHY HEAD AND NECK TECHNIQUE: Multidetector CT imaging of the head and neck was  performed using the standard protocol during bolus administration of intravenous contrast. Multiplanar CT image reconstructions and MIPs were obtained to evaluate the vascular anatomy. Carotid stenosis measurements (when applicable) are obtained utilizing NASCET criteria, using the distal internal carotid diameter as the denominator. CONTRAST:  196mL ISOVUE-370 IOPAMIDOL (ISOVUE-370) INJECTION 76% COMPARISON:  Head CT 05/24/2018 FINDINGS: CTA NECK FINDINGS SKELETON: Lower cervical degenerative disc disease. No bony spinal canal stenosis. OTHER NECK: Normal pharynx, larynx and major salivary glands. No cervical lymphadenopathy. Unremarkable thyroid gland. UPPER CHEST: No pneumothorax or pleural effusion. No nodules or masses. AORTIC ARCH: There is mild calcific atherosclerosis of the aortic arch. There is no aneurysm, dissection or hemodynamically significant stenosis of the visualized ascending aorta and aortic arch. Conventional 3 vessel aortic branching pattern. The visualized proximal subclavian arteries are widely patent. RIGHT CAROTID SYSTEM: --Common carotid artery: Widely patent origin without common carotid artery dissection or aneurysm. --Internal carotid artery: No dissection, occlusion or aneurysm. Mild atherosclerotic calcification at the carotid bifurcation without hemodynamically significant stenosis. --External carotid artery: No acute abnormality. LEFT CAROTID SYSTEM: --Common carotid artery: Widely patent origin without common carotid artery dissection or aneurysm. --Internal carotid artery: No dissection, occlusion or aneurysm. There is mixed density atherosclerosis extending into the proximal ICA, resulting in 60% stenosis. --External carotid artery: No acute abnormality. VERTEBRAL ARTERIES: Left dominant configuration. Both origins are normal. The right vertebral artery  is diminutive along its entire course with attenuated contrast-enhancement. There is complete loss of opacification beyond the  proximal V3 segment. The dominant left vertebral artery is widely patent along its entire course. There is atherosclerotic calcification of the V4 segment without hemodynamically significant stenosis. CTA HEAD FINDINGS POSTERIOR CIRCULATION: --Basilar artery: Normal. --Posterior cerebral arteries: Normal. The right PCA is predominantly supplied by the posterior communicating artery. --Superior cerebellar arteries: Normal. --Inferior cerebellar arteries: Normal AICA bilaterally. The right PICA is supplied from the left PICA. ANTERIOR CIRCULATION: --Intracranial internal carotid arteries: Moderate atherosclerotic calcification. --Anterior cerebral arteries: Normal. Both A1 segments are present. Patent anterior communicating artery. --Middle cerebral arteries: There is severe stenosis of the distal left M1 segment, which is otherwise normal caliber distally. The right middle cerebral artery is normal. --Posterior communicating arteries: Right p-comm gives rise to the right PCA. No left p-comm is visualized. VENOUS SINUSES: As permitted by contrast timing, patent. ANATOMIC VARIANTS: Fetal origin of the right posterior cerebral artery. DELAYED PHASE: Not performed Review of the MIP images confirms the above findings. IMPRESSION: 1. No emergent large vessel occlusion. 2. Severe stenosis of the distal left M1 segment, with preserved distal patency. 3. Attenuated appearance of the right vertebral artery with complete loss of patency at the proximal V3 segment. Right PICA supplied via collaterals from left PICA. 4. 60% stenosis of the proximal left internal carotid artery. These results were communicated to Dr. Karena Addison Aroor at 2:52 pm on 05/24/2018 by text page via the [facility] messaging system. Electronically Signed   By: Ulyses Jarred M.D.   On: 05/24/2018 14:51   Mr Brain Wo Contrast  Result Date: 05/24/2018 CLINICAL DATA:  Right facial droop EXAM: MRI HEAD WITHOUT CONTRAST TECHNIQUE: Multiplanar, multiecho pulse  sequences of the brain and surrounding structures were obtained without intravenous contrast. COMPARISON:  CTA head neck 05/24/2018 FINDINGS: BRAIN: There is multifocal cortical and subcortical diffusion restriction within the left MCA territory. Additionally, there is a focus of abnormal diffusion restriction in the right frontal white matter. The midline structures are normal. No midline shift or other mass effect. There are old bilateral lacunar infarcts. There is an old left frontoparietal infarct. Multifocal periventricular white matter hyperintensity, most often a result of chronic microvascular ischemia. Generalized atrophy without lobar predilection. Susceptibility-sensitive sequences show no chronic microhemorrhage or superficial siderosis. VASCULAR: Major intracranial arterial and venous sinus flow voids are normal. SKULL AND UPPER CERVICAL SPINE: Calvarial bone marrow signal is normal. There is no skull base mass. Visualized upper cervical spine and soft tissues are normal. SINUSES/ORBITS: No fluid levels or advanced mucosal thickening. No mastoid or middle ear effusion. The orbits are normal. IMPRESSION: 1. Multifocal acute ischemia within the left MCA territory, predominantly cortical and subcortical and near the watershed zones at the ACA/MCA and MCA/PCA junctions. These could be watershed or embolic infarcts. 2. Additional small focus of ischemia in the right frontal white matter, more suggestive of embolic etiology. 3. Chronic ischemic microangiopathy. 4. No hemorrhage or mass effect. Electronically Signed   By: Ulyses Jarred M.D.   On: 05/24/2018 21:05   Ct Head Code Stroke Wo Contrast  Result Date: 05/24/2018 CLINICAL DATA:  Code stroke. 85 y/o M; slurred speech and right facial droop. EXAM: CT HEAD WITHOUT CONTRAST TECHNIQUE: Contiguous axial images were obtained from the base of the skull through the vertex without intravenous contrast. COMPARISON:  12/13/2017 CT head. FINDINGS: Brain: No  evidence of acute infarction, hemorrhage, hydrocephalus, extra-axial collection or mass lesion/mass effect. Stable small chronic  left frontoparietal infarct and left lentiform nucleus lacunar infarct. Stable nonspecific white matter hypodensities compatible with chronic microvascular ischemic changes. Stable volume loss of the brain. Vascular: Calcific atherosclerosis of carotid siphons. No hyperdense vessel identified. Skull: Normal. Negative for fracture or focal lesion. Sinuses/Orbits: Mild maxillary and ethmoid sinus mucosal thickening. Normal aeration of the mastoid air cells. Other: None. ASPECTS [facility] Stroke Program Early CT Score) - Ganglionic level infarction (caudate, lentiform nuclei, internal capsule, insula, M1-M3 cortex): 7 - Supraganglionic infarction (M4-M6 cortex): 3 Total score (0-10 with 10 being normal): 10 IMPRESSION: 1. No acute intracranial abnormality identified. 2. Stable small chronic left frontoparietal infarct. Stable chronic microvascular ischemic changes and volume loss of the brain. 3. ASPECTS is 10 These results were communicated to Dr. Lorraine Lax at 2:22 pmon 1/7/2020by text page via the [facility] messaging system. Electronically Signed   By: Kristine Garbe M.D.   On: 05/24/2018 14:25   Vas US Carotid  Result Date: 05/25/2018 Carotid Arterial Duplex Study Indications:       Stroke, Carotid stenosis on left. Other Factors:     Coronary Balloon Angioplasty on 04/18/2018. Limitations:       Vessels tortuosity Comparison Study:  Carotid duplex exam on 03/29/2014 Performing Technologist: Rudell Cobb  Examination Guidelines: A complete evaluation includes B-mode imaging, spectral Doppler, color Doppler, and power Doppler as needed of all accessible portions of each vessel. Bilateral testing is considered an integral part of a complete examination. Limited examinations for reoccurring indications may be performed as noted.  Right Carotid Findings:  +----------+--------+--------+--------+------------------------+--------+           PSV cm/sEDV cm/sStenosisDescribe                Comments +----------+--------+--------+--------+------------------------+--------+ CCA Prox  119     22              focal and calcific      tortuous +----------+--------+--------+--------+------------------------+--------+ CCA Distal69      22                                               +----------+--------+--------+--------+------------------------+--------+ ICA Prox  77      32      1-39%   diffuse and heterogenous         +----------+--------+--------+--------+------------------------+--------+ ICA Mid   58      21                                               +----------+--------+--------+--------+------------------------+--------+ ICA Distal78      30                                               +----------+--------+--------+--------+------------------------+--------+ ECA       96      12                                               +----------+--------+--------+--------+------------------------+--------+ +----------+--------+-------+--------+-------------------+           PSV cm/sEDV cmsDescribeArm  Pressure (mmHG) +----------+--------+-------+--------+-------------------+ Subclavian119                                        +----------+--------+-------+--------+-------------------+ +---------+--------+--+--------+-+---------+ VertebralPSV cm/s26EDV cm/s5Antegrade +---------+--------+--+--------+-+---------+  Left Carotid Findings: +----------+--------+--------+--------+------------------------+--------+           PSV cm/sEDV cm/sStenosisDescribe                Comments +----------+--------+--------+--------+------------------------+--------+ CCA Prox  55      15              diffuse and heterogenoustortuous +----------+--------+--------+--------+------------------------+--------+ CCA Distal52       10                                               +----------+--------+--------+--------+------------------------+--------+ ICA Prox  78      26      1-39%   diffuse and calcific             +----------+--------+--------+--------+------------------------+--------+ ICA Mid   89      39                                               +----------+--------+--------+--------+------------------------+--------+ ICA Distal80      31                                               +----------+--------+--------+--------+------------------------+--------+ ECA       79      14                                               +----------+--------+--------+--------+------------------------+--------+ +----------+--------+--------+--------+-------------------+ SubclavianPSV cm/sEDV cm/sDescribeArm Pressure (mmHG) +----------+--------+--------+--------+-------------------+           109                                         +----------+--------+--------+--------+-------------------+ +---------+--------+--+--------+--+---------+ VertebralPSV cm/s65EDV cm/s22Antegrade +---------+--------+--+--------+--+---------+  Summary: Right Carotid: Velocities in the right ICA are consistent with a 1-39% stenosis. Left Carotid: Velocities in the left ICA are consistent with a 1-39% stenosis. Vertebrals: Bilateral vertebral arteries demonstrate antegrade flow. *See table(s) above for measurements and observations.     Preliminary     PHYSICAL EXAM  pleasant elderly Caucasian male currently not in distress. He is hard of hearing. . Afebrile. Head is nontraumatic. Neck is supple with soft bilateralt bruits.    Cardiac exam after injection murmur. No  gallop. Lungs are clear to auscultation. Distal pulses are well felt. Neurological Exam :  Coag alert oriented 3 with normal speech and language function. Mild diminished hearing bilaterally. Pupils irregular equal reactive. Fundi not visualized.  Extraocular moments are full range without nystagmus. Blinks to threat bilaterally. Mild right lower facial weakness. Tongue midline. Motor system exam reveals no upper or lower extremity drift but mild weakness of right grip and  intrinsic hand muscles and right wrist extensors and flexors. Mild weakness of elbow flexors and extensors. Symmetric lower extremity stent without weakness. Deep tendon reflexes are symmetric. Plantars are downgoing. Gait not tested. ASSESSMENT/PLAN Mr. REVANTH NEIDIG is a 85 y.o. male with history of HTN, HLD, CAD and prior stroke w/ L ICA and L M1 severe stenosis w/ residual R HP presenting with slurred speech and R sided weakness.    Stroke:  left MCA  infarct secondary to symptomatic   high grade L MCA M1 stenosis,    Code Stroke CT head No acute stroke. Old L frontoparietal infarct. Small vessel disease. Atrophy. ASPECTS 10.     CTA head & neck no ELVO. Severe distal LM1 stenosis. R VA not seen. R PICA supplied from L PICA collaterals. L ICA 60% proximal stenosis  MRI  L MCA multifocal cortical and subcortical ischemia, possible watershed territory. Small R frontal white matter infarct. Small vessel disease.   Carotid Doppler  B ICA 1-39% stenosis, VAs antegrade   2D Echo  pending   NO indication for carotid surgery at this time  LDL 81  HgbA1c 8.2  Lovenox 40 mg sq daily for VTE prophylaxis  aspirin 81 mg daily and clopidogrel 75 mg daily prior to admission, now on aspirin 81 mg daily and clopidogrel 75 mg daily. Continue DAPT at d/c  Therapy recommendations:  CIR  Disposition:  pending   Discussed with Dr. Leonie Man who will call Dr. Bronson Curb for consult (see previous stroke info below)  Hx stroke/TIA  08/2016 episode of transient dysarthria and right facial droop while off plavix for cervical injection, MRI and CUS negative. Likely related to left MCA high grade stenosis. Continued on DAPT. Recent A1C 7.0. BP stable  05/2015, 12/2015 and 07/2016 -  followed up with Dr. Estanislado Pandy. repeat MRA showed stable left ICA petrous 50% and supraclinoid 50% stenosis as well as left M1/M2 high grade stenosis.    01/22/15 - L MCA/PCA and MCA/ACA watershed infarcts, and MRA showed left petrous portion ICA high grade stenosis 90%. CTA head and neck showed no thrombosis distal to left petrous ICA stenosis. underwent cerebral angio with angioplasty, no stent required. Repeat MRA post procedure showed stenosis improved to 50%. A1C 7.8 and LDL 61. He was discharged with dual antiplatelet and crestor.   2015- dizziness episode in 2015.   2013 - left MCA stroke   Hypertension  Stable 160-180s . Permissive hypertension (OK if < 220/120) but gradually normalize in 5-7 days . Long-term BP goal normotensive  Hyperlipidemia  Home meds:  No statin  LDL 81, goal < 70  Intolerant to statins  Given relatively normal LDL, will not consider PSCK9 at this time  Diabetes type II  HgbA1c 8.2, goal < 7.0  Uncontrolled  Hypoglycemia in hosptial  Home meds:  amaryl and metformin  DB RN following - recommends d/c amaryl and consider if metformin should be continued  Other Stroke Risk Factors  Advanced age  Former Cigarette smoker, quit 22 yrs ago  Coronary artery disease s/p CABG, stents - STEMI in 03/2016 s/p PCI.   Obstructive sleep apnea, not on CPAP at home  Hx Congestive heart failure  AAA  Other Active Problems  chronic anemia  CKD  UTI  Hospital day # 0  Burnetta Sabin, MSN, APRN, ANVP-BC, AGPCNP-BC Advanced Practice Stroke Nurse [place] for Schedule & Pager information 05/25/2018 10:52 AM  I have personally obtained history,examined this patient, reviewed notes,  independently viewed imaging studies, participated in medical decision making and plan of care.ROS completed by me personally and pertinent positives fully documented  I have made any additions or clarifications directly to the above note. Agree  with note above.  He presented with slurred speech and right hand weakness secondary to symptomatic severe left MCA stenosis. CT angio neck suggest proximal left carotid stenosis by carotid ultrasound does not confirm this. Recommend continued on dual antiplatelet therapy and aggressive risk factor modification. Consult Dr. Estanislado Pandy for elective left MCA angioplasty/stenting if patient is willing.discussed with Dr. Estanislado Pandy. Greater than 50% time during 35 minute visit was spent on counseling and coordination of care about his symptomatic MCA stenosis and discussion about treatment options and answered questions. Antony Contras, MD Medical Director [facility] Stroke Center Pager: [phone] 05/25/2018 4:32 PM  To contact Stroke Continuity provider, please refer to [email or web address] After hours, contact General Neurology

## 2018-05-25 NOTE — Evaluation (Signed)
Occupational Therapy Evaluation Patient Details Name: [name] MRN: [id number] DOB: [birth year] Today's Date: 05/25/2018    History of Present Illness Pt is an 85 y/o male admitted secondary to acute onset R sided weakness. MRI revealed multifocal acute ischemia with L MCA territory. PMH including but not limited to vasculopathic hx (including AAA, hx of CABG, MI, CHF, and carotid stenosis), Type 2 Diabetes, hx of stroke, HTN, HLD, CKD, and chronic anemia.   Clinical Impression   PTA patient independent and driving, 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.  Patient admitted for above and limited by decreased functional use of R UE, impaired coordination, impaired memory and safety, and impaired balance.  Patient currently requires min assist for UB ADLs, min assist for LB ADLs, min assist for transfers, and mod assist for toielting.  Patient will benefit from continued OT services while admitted and will best benefit from intenseive CIR level rehab after dc in order to optimize independence and safety with ADLs/mobility in order to return to PLOF (as patient was primary caregiver in his home).      Follow Up Recommendations  CIR;Supervision/Assistance - 24 hour    Equipment Recommendations  Other (comment)(TBD at next venue of care)    Recommendations for Other Services Rehab consult     Precautions / Restrictions Precautions Precautions: Fall Restrictions Weight Bearing Restrictions: No      Mobility Bed Mobility Overal bed mobility: Modified Independent             General bed mobility comments: seated OOB in recliner upon entry  Transfers Overall transfer level: Needs assistance Equipment used: None Transfers: Sit to/from Stand Sit to Stand: Min assist         General transfer comment: min A for stability with transition as pt slightly unsteady initially    Balance Overall balance assessment: Needs assistance Sitting-balance support: Feet supported Sitting  balance-Leahy Scale: Good Sitting balance - Comments: pt able to don socks seated EOB with supervision   Standing balance support: No upper extremity supported;During functional activity Standing balance-Leahy Scale: Poor Standing balance comment: preference to UE support                           ADL either performed or assessed with clinical judgement   ADL Overall ADL's : Needs assistance/impaired     Grooming: Minimal assistance;Standing;Wash/dry hands   Upper Body Bathing: Minimal assistance;Sitting   Lower Body Bathing: Minimal assistance;Sit to/from stand   Upper Body Dressing : Minimal assistance;Sitting   Lower Body Dressing: Sit to/from stand;Minimal assistance   Toilet Transfer: Minimal assistance;Ambulation Toilet Transfer Details (indicate cue type and reason): simulated in room Toileting- Clothing Manipulation and Hygiene: Moderate assistance;Sit to/from stand       Functional mobility during ADLs: Minimal assistance General ADL Comments: patient demonstrates decreased functional use of R UE, affecting independence with daily activities      Vision Baseline Vision/History: Wears glasses Wears Glasses: At all times Patient Visual Report: Blurring of vision Vision Assessment?: Yes Eye Alignment: Within Functional Limits Ocular Range of Motion: Within Functional Limits Alignment/Gaze Preference: Within Defined Limits Tracking/Visual Pursuits: Able to track stimulus in all quads without difficulty Convergence: Within functional limits     Perception     Praxis      Pertinent Vitals/Pain Pain Assessment: No/denies pain     Hand Dominance Left   Extremity/Trunk Assessment Upper Extremity Assessment Upper Extremity Assessment: RUE deficits/detail RUE Deficits / Details:  grossly 3-/5 shoudler flexion, 3/5 eblow flex/extension, 3-/5 pronation, 3/5 supination, and 3-/5 wrist/hand gross flex/extension. impaired sensation and proprioception  RUE  Sensation: decreased light touch;decreased proprioception RUE Coordination: decreased fine motor;decreased gross motor   Lower Extremity Assessment Lower Extremity Assessment: Defer to PT evaluation   Cervical / Trunk Assessment Cervical / Trunk Assessment: Normal   Communication Communication Communication: No difficulties   Cognition Arousal/Alertness: Awake/alert Behavior During Therapy: WFL for tasks assessed/performed Overall Cognitive Status: Impaired/Different from baseline Area of Impairment: Memory;Safety/judgement;Problem solving                     Memory: Decreased short-term memory   Safety/Judgement: Decreased awareness of deficits   Problem Solving: Difficulty sequencing;Requires verbal cues     General Comments  reviewed protection strateiges for R UE, placing on pillow before exiting to promote improve positoining     Exercises     Shoulder Instructions      Home Living Family/patient expects to be discharged to:: Private residence Living Arrangements: Spouse/significant other;Parent Available Help at Discharge: Family Type of Home: House Home Access: Ramped entrance     Home Layout: Two level Alternate Level Stairs-Number of Steps: flight Alternate Level Stairs-Rails: Right Bathroom Shower/Tub: Tub/shower unit;Walk-in shower   Bathroom Toilet: Handicapped height     Home Equipment: Shower seat;Cane - single point;Crutches;Walker - 2 wheels;Wheelchair - manual          Prior Functioning/Environment Level of Independence: Independent        Comments: independent, driving, and managing medication; 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         OT Problem List: Decreased strength;Decreased range of motion;Decreased activity tolerance;Impaired balance (sitting and/or standing);Impaired vision/perception;Decreased coordination;Decreased cognition;Decreased safety awareness;Decreased knowledge of use of DME or AE;Decreased knowledge of  precautions;Impaired sensation;Impaired UE functional use      OT Treatment/Interventions: Self-care/ADL training;Neuromuscular education;DME and/or AE instruction;Therapeutic activities;Visual/perceptual remediation/compensation;Patient/family education;Balance training;Cognitive remediation/compensation    OT Goals(Current goals can be found in the care plan section) Acute Rehab OT Goals Patient Stated Goal: return to independence OT Goal Formulation: With patient Time For Goal Achievement: 06/08/18 Potential to Achieve Goals: Good  OT Frequency: Min 3X/week   Barriers to D/C:            Co-evaluation              AM-PAC OT "6 Clicks" Daily Activity     Outcome Measure Help from another person eating meals?: A Little Help from another person taking care of personal grooming?: A Little Help from another person toileting, which includes using toliet, bedpan, or urinal?: A Lot Help from another person bathing (including washing, rinsing, drying)?: A Little Help from another person to put on and taking off regular upper body clothing?: A Little Help from another person to put on and taking off regular lower body clothing?: A Little 6 Click Score: 17   End of Session Equipment Utilized During Treatment: Gait belt Nurse Communication: Mobility status  Activity Tolerance: Patient tolerated treatment well Patient left: in chair;with call bell/phone within reach;with chair alarm set  OT Visit Diagnosis: Other abnormalities of gait and mobility (R26.89);Muscle weakness (generalized) (M62.81);Hemiplegia and hemiparesis Hemiplegia - Right/Left: Right Hemiplegia - dominant/non-dominant: Non-Dominant Hemiplegia - caused by: Cerebral infarction                Time: 9211-9417 OT Time Calculation (min): 17 min Charges:  OT General Charges $OT Visit: 1 Visit OT Evaluation $OT Eval Moderate Complexity: 1  Mod  Delight Stare, OT Acute Rehabilitation Services Pager  [phone] Office [phone]   Delight Stare 05/25/2018, 10:49 AM

## 2018-05-26 ENCOUNTER — Encounter (HOSPITAL_COMMUNITY): Payer: Self-pay | Admitting: Emergency Medicine

## 2018-05-26 ENCOUNTER — Other Ambulatory Visit: Payer: Self-pay

## 2018-05-26 DIAGNOSIS — E1169 Type 2 diabetes mellitus with other specified complication: Secondary | ICD-10-CM

## 2018-05-26 LAB — GLUCOSE, CAPILLARY
Glucose-Capillary: 113 mg/dL — ABNORMAL HIGH (ref 70–99)
Glucose-Capillary: 266 mg/dL — ABNORMAL HIGH (ref 70–99)
Glucose-Capillary: 216 mg/dL — ABNORMAL HIGH (ref 70–99)
Glucose-Capillary: 226 mg/dL — ABNORMAL HIGH (ref 70–99)

## 2018-05-26 LAB — BASIC METABOLIC PANEL
Anion gap: 8 (ref 5–15)
BUN: 19 mg/dL (ref 8–23)
CO2: 23 mmol/L (ref 22–32)
CREATININE: 1.47 mg/dL — AB (ref 0.61–1.24)
Calcium: 8.4 mg/dL — ABNORMAL LOW (ref 8.9–10.3)
Chloride: 107 mmol/L (ref 98–111)
GFR calc non Af Amer: 43 mL/min — ABNORMAL LOW (ref >60)
GFR, EST AFRICAN AMERICAN: 50 mL/min — AB (ref >60)
Glucose, Bld: 109 mg/dL — ABNORMAL HIGH (ref 70–99)
Potassium: 3.6 mmol/L (ref 3.5–5.1)
Sodium: 138 mmol/L (ref 135–145)

## 2018-05-26 LAB — CBC
HCT: 24.7 % — ABNORMAL LOW (ref 39.0–52.0)
Hemoglobin: 7.9 g/dL — ABNORMAL LOW (ref 13.0–17.0)
MCH: 28 pg (ref 26.0–34.0)
MCHC: 32 g/dL (ref 30.0–36.0)
MCV: 87.6 fL (ref 80.0–100.0)
Platelets: 227 10*3/uL (ref 150–400)
RBC: 2.82 MIL/uL — ABNORMAL LOW (ref 4.22–5.81)
RDW: 18.1 % — ABNORMAL HIGH (ref 11.5–15.5)
WBC: 5.6 10*3/uL (ref 4.0–10.5)
nRBC: 0 % (ref 0.0–0.2)

## 2018-05-26 LAB — TSH: TSH: 0.609 u[IU]/mL (ref 0.350–4.500)

## 2018-05-26 NOTE — H&P (Signed)
Physical Medicine and Rehabilitation Admission H&P    Chief Complaint  Patient presents with  . Code Stroke  : HPI: [name] is an 85 year old right-handed male with history of AAA, CAD with CABG, diastolic congestive heart failure, suprapubic catheter 1 year, diabetes mellitus, CVA 2016 with mild right side weakness maintained on aspirin and Plavix,previous balloon angioplasty of left ICA petrous segment 01/25/2015 per interventional radiology, chronic renal insufficiency. Per chart review and patient, patient lives with spouse. 2 level home with ramped entrance. Independent driving managing medications. 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 He has a daughter approximate 40 miles away that works. Presented  05/24/2018 with right-sided weakness. Cranial CT scan reviewed, unremarkable for acute intracranial process. Per report, stable small chronic left frontoparietal infarction. CT angiogram of head and neck with no emergent large vessel occlusion. 60% stenosis of proximal left ICA and severe stenosis of the distal left M1 segment with preserved distal patency. MRI the brain multifocal acute ischemia within the left MCA territory as well as additional small focus of ischemia in the right frontal white matter. Carotid Dopplers with no ICA stenosis. Echocardiogram with ejection fraction of 55% no wall motion abnormalities grade 1 diastolic dysfunction. Currently maintained on aspirin and Plavix for CVA prophylaxis. Subcutaneous Lovenox for DVT prophylaxis. Tolerating a regular diet. Interventional radiology consult to 05/26/2018 after CT angiogram head and neck reviewed current plans for outpatient cerebral angiogram question need for angioplasty/stenting 06/06/2018 7:30 AM. Therapy evaluations completed with recommendations of physical medicine rehabilitation consult. Patient was admitted for a comprehensive rehabilitation program.  Review of Systems    Constitutional: Negative for chills and fever.  HENT: Negative for hearing loss.   Eyes: Negative for blurred vision and double vision.  Respiratory: Negative for cough and shortness of breath.   Cardiovascular: Negative for chest pain, palpitations and leg swelling.  Gastrointestinal: Positive for constipation. Negative for nausea and vomiting.  Genitourinary: Positive for urgency. Negative for dysuria and flank pain.  Musculoskeletal: Positive for joint pain and myalgias.  Skin: Negative for rash.  Neurological: Positive for dizziness, sensory change and focal weakness.  All other systems reviewed and are negative.  Past Medical History:  Diagnosis Date  . Abdominal aortic aneurysm ([place])    a. Aortic duplex 06/2014: mild aneurysmal dilatation of proximal abdominal aorta measuring 3.4x3.4cm. No sig change from 2012. F/u due 06/2016;   . AKI (acute kidney injury) ([place]) 03/28/2016  . Arthritis    "right knee; never bothered me" (03/26/2016)  . 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 CAD in native artery    a. NSTEMI 11/2010 - CABG x2(LIMA to LAD, SVG to PDA). b. NEG Lexi MV 10/24/13, EF 53%, no perfusion abnormality, septal and apical HK noted. c. NSTEMI 05/2014 - s/p DES to SVG-RPDA 06/18/14 (Xience Alpine DES 3.0 x 18 mm -3.35 mm), EF 60-65; d. 03/2016 STEMI/PCI: LM nl, ost LAD 70%, mLCx 50%, pRCA 95% - mRCA 60%, dRPDA 70%, LIMA->LAD ok, o-p SVG->RPDA 100% (3.0x16 Promus DES overlapping prior stent).  . Diastolic dysfunction    a. 03/2016 Echo: EF 60-65%, no rwma, Gr1 DD, triv AI, Ao root 71mm, Asc Ao 99mm, triv MR.  . Dizziness    a. Carotid duplex 03/2014: mild fibrous plaque, no significant stenosis.  . Foley catheter in place    "been wearing it for a couple months now" (03/26/2016)  . HTN (hypertension)   . Hyperlipidemia LDL  goal <70   . Medical history non-contributory   . Non-STEMI (non-ST elevated myocardial infarction) ([place]) 11/2010; 03/2018    1. Ostial LAD 70% (to close to [facility] for PCI), subtotal occlusion of the RCA; 2.  In-stent restenosis SVG-RCA -PTCA.  Marland Kitchen Postoperative atrial fibrillation ([place]) 11/2010   Post CABG, no sign recurrence  . Refusal of blood transfusions as patient is Jehovah's Witness   . 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 Servando Snare)  . Sleep apnea    Not on CPAP. (03/26/2016)  . ST elevation myocardial infarction (STEMI) of inferior wall ([place]) 04/05/2016   Occluded in-stent restenosis/thrombosis of SVG-RCA --> treated with overlapping Promus DES 3.0 mm 16 mm postdilated 3.0 mm).  . Stroke [facility]) 2014; 01/2015   a. 2014 with mild right hand weakness, nonhemorrhagic per pt.;; b. - PTA-Stent L ICA 95%   . Type II diabetes mellitus ([place])    Past Surgical History:  Procedure Laterality Date  . APPENDECTOMY    . CARDIAC CATHETERIZATION  12/11/2010   Dr. Chase Picket - subsequent cath - normal LV systolic function, no renal artery stenosis, severe 2-vessel disease with subtotaled RCA prox and distal 60% lesion and complex 70% area of narrowing of ostium of LAD  . CARDIAC CATHETERIZATION N/A 03/26/2016   Procedure: Left Heart Cath and Coronary Angiography;  Surgeon: Nelva Bush, MD;  Location: [place] [facility];  Service: Cardiovascular;  Laterality: N/A;  . CARDIAC CATHETERIZATION N/A 03/26/2016   Procedure: Coronary Stent Intervention;  Surgeon: Nelva Bush, MD;  Location: [place] [facility];  Service: Cardiovascular: 100% In-stent thrombosis of pros SVG-RCA (Xience DES) --> treated with PromusDES 3.0 x 18 (3.3 mm)  . CARDIAC CATHETERIZATION N/A 03/26/2016   Procedure: Bypass Graft Angiography;  Surgeon: Nelva Bush, MD;  Location: [place] [facility];  Service: Cardiovascular;  Laterality: N/A;  . CORONARY ANGIOPLASTY WITH STENT PLACEMENT  06/18/2014   PCI to SVG-RPDA 06/18/14 (Xience Alpine DES 3.0 x 18 mm -3.35 mm),   . CORONARY ARTERY BYPASS GRAFT  12/15/2010   X2, Dr Servando Snare; LIMA to LAD, SVG to  PDA;   . CORONARY BALLOON ANGIOPLASTY N/A 04/18/2018   Procedure: CORONARY BALLOON ANGIOPLASTY;  Surgeon: Leonie Man, MD;  Location: [place] [facility];  Service: Cardiovascular;;; high pressure scoring and noncompliant balloon PTCA of SVG-RCA ISR ostial and proximal  . CORONARY/GRAFT ANGIOGRAPHY N/A 08/27/2017   Procedure: CORONARY/GRAFT ANGIOGRAPHY;  Surgeon: Nelva Bush, MD;  Location: [place] [facility];  Service: Cardiovascular;; pLAD 70%, ostD1 50%.  mCx 60%, OM2 80%. pRCA 95% & mRCA 100% - rPDA 70%. LIMA-mLAD patent. SVG-rPDA 10% ISR.   Marland Kitchen CYSTOSCOPY WITH URETHRAL DILATATION    . IR ANGIO INTRA EXTRACRAN SEL COM CAROTID INNOMINATE BILAT MOD SED  02/25/2017  . IR ANGIO INTRA EXTRACRAN SEL COM CAROTID INNOMINATE BILAT MOD SED  10/19/2017   Dr. Kathee Delton: L Common Carotid - ECA & major branches widely patent. ICA ~20% distal to bulb & 50% in supraclinoid segment. LMCA-distal 1/3 MI ~905 stenosis with post-stenotic dilation into inferior division. ~50% prox Basilar A stenosis @ anterior Inf Cerebellar A. 50% R ICA  . IR ANGIO VERTEBRAL SEL SUBCLAVIAN INNOMINATE UNI L MOD SED  02/25/2017  . IR ANGIO VERTEBRAL SEL SUBCLAVIAN INNOMINATE UNI R MOD SED  10/19/2017  . IR ANGIO VERTEBRAL SEL VERTEBRAL UNI L MOD SED  10/19/2017  . IR GENERIC HISTORICAL  01/21/2016   IR RADIOLOGIST EVAL & MGMT 01/21/2016  [facility]INTER[facility] RAD  . IR GENERIC HISTORICAL  02/03/2016   IR CATHETER TUBE CHANGE 02/03/2016 Marybelle Killings, MD [facility]-INTERV RAD  . IR RADIOLOGIST EVAL & MGMT  11/09/2017  . LEFT HEART CATH AND CORONARY ANGIOGRAPHY N/A 04/18/2018   Procedure: LEFT HEART CATH AND CORONARY ANGIOGRAPHY;  Surgeon: Leonie Man, MD;  Location: [place] [facility];  Service: Cardiovascular;  Laterality: N/A; stable findings on last cath with exception of 75% in-stent restenosis of SVG-RCA ostial stent treated with PTCA.    Marland Kitchen LEFT HEART CATHETERIZATION WITH CORONARY ANGIOGRAM N/A 06/18/2014   Procedure: LEFT HEART CATHETERIZATION WITH  CORONARY ANGIOGRAM;  Surgeon: Leonie Man, MD;  Location: [facility] CATH LAB;  Service: Cardiovascular;  -- severe disease of SVG-rPDA  . NO PAST SURGERIES    . RADIOLOGY WITH ANESTHESIA N/A 01/24/2015   Procedure: STENT ASSISTED ANGIOPLASTY (RADIOLOGY WITH ANESTHESIA);  Surgeon: Luanne Bras, MD;  Location: [place];  Service: Radiology;  Laterality: N/A;  . TONSILLECTOMY    . TRANSTHORACIC ECHOCARDIOGRAM  08/2017; 04/2018:    A) EF 60-65%. Mild LVH. No RWMA. Gr 1 DD. Mod LA dilation.;  B)  EF 35-40%.  Severe LVH.  GRII DD.  Apical anteroseptal hypokinesis.  Akinesis of the apex.   Family History  Problem Relation Age of Onset  . Heart Problems Father 40   Social History:  reports that he quit smoking about 59 years ago. His smoking use included cigarettes. He has a 3.30 pack-year smoking history. He has never used smokeless tobacco. He reports that he does not drink alcohol or use drugs. Allergies:  Allergies  Allergen Reactions  . Other Other (See Comments)    No  BLOOD PRODUCTS - Pt is Jehovah's Witness  . Statins Other (See Comments)    Cause muscle aches  . Atorvastatin Other (See Comments)    Myalgias  . Crestor [Rosuvastatin] Other (See Comments)    Myalgias   . Pravastatin Other (See Comments)    Myalgias  . Simvastatin Other (See Comments)    Myalgias    Medications Prior to Admission  Medication Sig Dispense Refill  . amLODipine (NORVASC) 10 MG tablet Take 10 mg by mouth every evening.     Marland Kitchen aspirin 81 MG chewable tablet Chew 1 tablet (81 mg total) by mouth daily.    . carvedilol (COREG) 6.25 MG tablet Take 0.5 tablets (3.125 mg total) by mouth every morning AND 1 tablet (6.25 mg total) every evening. (Patient taking differently: Take 3.125 mg by mouth in the morning and 6.25 mg in the evening) 135 tablet 3  . ciprofloxacin (CIPRO) 500 MG tablet Take 500 mg by mouth 2 (two) times daily. FOR 10 DAYS    . clopidogrel (PLAVIX) 75 MG tablet Take 1 tablet (75 mg total) by mouth  daily. 90 tablet 3  . doxazosin (CARDURA) 1 MG tablet Take 1 tablet (1 mg total) by mouth daily. 30 tablet 0  . Ferrous Sulfate Dried (FERROUS SULFATE IRON) 200 (65 Fe) MG TABS Take 1 tablet by mouth 2 (two) times daily after a meal.    . furosemide (LASIX) 20 MG tablet Take 1 tablet (20 mg total) by mouth daily. 30 tablet 0  . glimepiride (AMARYL) 4 MG tablet Take 4 mg by mouth 2 (two) times daily with a meal.     . isosorbide mononitrate (IMDUR) 60 MG 24 hr tablet Take 1 tablet (60 mg total) by mouth 2 (two) times daily. 60 tablet 0  . metFORMIN (GLUCOPHAGE) 1000  MG tablet Take 1 tablet (1,000 mg total) by mouth 2 (two) times daily.    . nitroGLYCERIN (NITROSTAT) 0.4 MG SL tablet Place 1 tablet (0.4 mg total) under the tongue every 5 (five) minutes as needed for chest pain (up to 3 doses). (Patient taking differently: Place 0.4 mg under the tongue every 5 (five) minutes x 3 doses as needed for chest pain. ) 25 tablet 3  . oxybutynin (DITROPAN) 5 MG tablet Take 5 mg by mouth daily as needed for bladder spasms.     . carvedilol (COREG) 12.5 MG tablet Take 6.25 mg by mouth 2 (two) times daily with a meal.    . hydrALAZINE (APRESOLINE) 100 MG tablet Take 1 tablet (100 mg total) by mouth 2 (two) times daily. DISCONTINUE PERVIOUS DOSE  PATIENT WILL CALL WHEN REFILL IS NEEDED (Patient not taking: Reported on 05/24/2018) 180 tablet 3    Drug Regimen Review Drug regimen was reviewed and remains appropriate with no significant issues identified  Home: Home Living Family/patient expects to be discharged to:: Private residence Living Arrangements: Spouse/significant other(mother in law lives with him and his wife) Available Help at Discharge: Family, Available 24 hours/day(daughter and friends are supervising wife and mother in Sports coach ) Type of Home: House Home Access: Ramped entrance Home Layout: Two level Alternate Level Stairs-Number of Steps: flight Alternate Level Stairs-Rails: Right Bathroom  Shower/Tub: Tub/shower unit, Multimedia programmer: Handicapped height Bathroom Accessibility: Yes Home Equipment: Civil engineer, contracting, Radio producer - single point, Crutches, Environmental consultant - 2 wheels, Wheelchair - manual  Lives With: Spouse(mother in Sports coach)   Functional History: Prior Function Level of Independence: Independent Comments: independent, driving, and managing medication; 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   Functional Status:  Mobility: Bed Mobility Overal bed mobility: Modified Independent General bed mobility comments: seated OOB in recliner upon entry Transfers Overall transfer level: Needs assistance Equipment used: None Transfers: Sit to/from Stand Sit to Stand: Min guard General transfer comment: min guard for safety; pt with instability initially upon standing but did not require any physical assistance Ambulation/Gait Ambulation/Gait assistance: Min assist, Min guard Gait Distance (Feet): 200 Feet(200' x2) Assistive device: None Gait Pattern/deviations: Step-through pattern, Decreased stride length, Drifts right/left General Gait Details: pt with consistent R sided drift with no awareness, bumping into objects on his R side throughout. Overall min A for stability and safety throughout with occasional brief periods of min guard for safety Gait velocity: decreased    ADL: ADL Overall ADL's : Needs assistance/impaired Eating/Feeding: Modified independent Grooming: Wash/dry hands, Wash/dry face, Oral care, Min guard, Standing Upper Body Bathing: Minimal assistance, Sitting Lower Body Bathing: Minimal assistance, Sit to/from stand Upper Body Dressing : Minimal assistance, Sitting Lower Body Dressing: Sit to/from stand, Minimal assistance Toilet Transfer: Minimal assistance, Ambulation Toilet Transfer Details (indicate cue type and reason): simulated in room Toileting- Clothing Manipulation and Hygiene: Moderate assistance, Sit to/from stand Functional mobility during  ADLs: Minimal assistance General ADL Comments: patient demonstrates decreased functional use of R UE, affecting independence with daily activities   Cognition: Cognition Overall Cognitive Status: Impaired/Different from baseline Orientation Level: Oriented X4 Cognition Arousal/Alertness: Awake/alert Behavior During Therapy: WFL for tasks assessed/performed Overall Cognitive Status: Impaired/Different from baseline Area of Impairment: Safety/judgement, Awareness Memory: Decreased short-term memory Safety/Judgement: Decreased awareness of deficits, Decreased awareness of safety Awareness: Intellectual Problem Solving: Requires verbal cues General Comments: Unsure if pt truly with deficit or if he is minimizing deficits due to caregiving responsibilities and need to return home.  When  challenged he will smuggly smile and say "yeah, I know"   Physical Exam: Blood pressure (!) 150/62, pulse (!) 54, temperature 98 F (36.7 C), temperature source Oral, resp. rate 18, height 6\' 1"  (1.854 m), weight 98.1 kg, SpO2 98 %. Physical Exam  Constitutional: He appears well-developed.  HENT:  Head: Normocephalic and atraumatic.  Eyes: Pupils are equal, round, and reactive to light. Conjunctivae are normal.  Neck: Normal range of motion.  Cardiovascular: Normal rate and regular rhythm. Exam reveals no friction rub.  No murmur heard. Respiratory: Effort normal. No respiratory distress.  GI: Soft. He exhibits no distension. There is no abdominal tenderness.  Genitourinary:    Genitourinary Comments: Suprapubic tube in place   Neurological:  Patient is alert in no acute distress. Follows full commands. Fair awareness of deficits. LUE and LLE 4 to 4+/5. RUE and RLE:3+ to 4-/5. Senses pain in all 4's   Skin: Skin is warm.    Results for orders placed or performed during the hospital encounter of 05/24/18 (from the past 48 hour(s))  Glucose, capillary     Status: None   Collection Time: 05/25/18  7:50  AM  Result Value Ref Range   Glucose-Capillary 77 70 - 99 mg/dL   Comment 1 Procedure Error   Glucose, capillary     Status: Abnormal   Collection Time: 05/25/18 11:48 AM  Result Value Ref Range   Glucose-Capillary 204 (H) 70 - 99 mg/dL   Comment 1 Notify RN    Comment 2 Document in Chart   Glucose, capillary     Status: Abnormal   Collection Time: 05/25/18  4:57 PM  Result Value Ref Range   Glucose-Capillary 166 (H) 70 - 99 mg/dL   Comment 1 Notify RN    Comment 2 Document in Chart   Glucose, capillary     Status: Abnormal   Collection Time: 05/25/18  9:17 PM  Result Value Ref Range   Glucose-Capillary 258 (H) 70 - 99 mg/dL  CBC     Status: Abnormal   Collection Time: 05/26/18  5:38 AM  Result Value Ref Range   WBC 5.6 4.0 - 10.5 K/uL   RBC 2.82 (L) 4.22 - 5.81 MIL/uL   Hemoglobin 7.9 (L) 13.0 - 17.0 g/dL   HCT 24.7 (L) 39.0 - 52.0 %   MCV 87.6 80.0 - 100.0 fL   MCH 28.0 26.0 - 34.0 pg   MCHC 32.0 30.0 - 36.0 g/dL   RDW 18.1 (H) 11.5 - 15.5 %   Platelets 227 150 - 400 K/uL   nRBC 0.0 0.0 - 0.2 %    Comment: Performed at [facility], [address], [place], [place] [address]  Basic metabolic panel     Status: Abnormal   Collection Time: 05/26/18  5:38 AM  Result Value Ref Range   Sodium 138 135 - 145 mmol/L   Potassium 3.6 3.5 - 5.1 mmol/L   Chloride 107 98 - 111 mmol/L   CO2 23 22 - 32 mmol/L   Glucose, Bld 109 (H) 70 - 99 mg/dL   BUN 19 8 - 23 mg/dL   Creatinine, Ser 1.47 (H) 0.61 - 1.24 mg/dL   Calcium 8.4 (L) 8.9 - 10.3 mg/dL   GFR calc non Af Amer 43 (L) >60 mL/min   GFR calc Af Amer 50 (L) >60 mL/min   Anion gap 8 5 - 15    Comment: Performed at [facility] [address], [place], [place]  [address]  VITAMIN D 25 Hydroxy (Vit-D Deficiency, Fractures)     Status: Abnormal   Collection Time: 05/26/18  5:38 AM  Result Value Ref Range   Vit D, 25-Hydroxy 8.8 (L) 30.0 - 100.0 ng/mL    Comment: (NOTE) Vitamin D deficiency has been defined by the  [place] practice guideline as a level of serum 25-OH vitamin D less than 20 ng/mL (1,2). The Endocrine Society went on to further define vitamin D insufficiency as a level between 21 and 29 ng/mL (2). 1. IOM (Institute of Medicine). 2010. Dietary reference   intakes for calcium and D. [place]: The   [employer]. 2. Holick MF, Binkley Baker, Bischoff-Ferrari HA, et al.   Evaluation, treatment, and prevention of vitamin D   deficiency: an Endocrine Society clinical practice   guideline. JCEM. 2011 Jul; 96(7):1911-30. Performed At: [facility] [place], [place] [id number] Rush Farmer MD [id number]   TSH     Status: None   Collection Time: 05/26/18  5:38 AM  Result Value Ref Range   TSH 0.609 0.350 - 4.500 uIU/mL    Comment: Performed by a 3rd Generation assay with a functional sensitivity of <=0.01 uIU/mL. Performed at [facility], [place] [address] [place], [place] [address]   Glucose, capillary     Status: Abnormal   Collection Time: 05/26/18  6:02 AM  Result Value Ref Range   Glucose-Capillary 113 (H) 70 - 99 mg/dL  Glucose, capillary     Status: Abnormal   Collection Time: 05/26/18 12:42 PM  Result Value Ref Range   Glucose-Capillary 266 (H) 70 - 99 mg/dL  Glucose, capillary     Status: Abnormal   Collection Time: 05/26/18  5:16 PM  Result Value Ref Range   Glucose-Capillary 216 (H) 70 - 99 mg/dL  Glucose, capillary     Status: Abnormal   Collection Time: 05/26/18  9:54 PM  Result Value Ref Range   Glucose-Capillary 226 (H) 70 - 99 mg/dL  Glucose, capillary     Status: Abnormal   Collection Time: 05/27/18  6:21 AM  Result Value Ref Range   Glucose-Capillary 112 (H) 70 - 99 mg/dL   Comment 1 Notify RN    Comment 2 Document in Chart    Vas US Carotid  Result Date: 05/25/2018 Carotid Arterial Duplex Study Indications:       Stroke, Carotid stenosis on left. Other Factors:     Coronary  Balloon Angioplasty on 04/18/2018. Limitations:       Vessels tortuosity Comparison Study:  Carotid duplex exam on 03/29/2014 Performing Technologist: Rudell Cobb  Examination Guidelines: A complete evaluation includes B-mode imaging, spectral Doppler, color Doppler, and power Doppler as needed of all accessible portions of each vessel. Bilateral testing is considered an integral part of a complete examination. Limited examinations for reoccurring indications may be performed as noted.  Right Carotid Findings: +----------+--------+--------+--------+------------------------+--------+           PSV cm/sEDV cm/sStenosisDescribe                Comments +----------+--------+--------+--------+------------------------+--------+ CCA Prox  119     22              focal and calcific      tortuous +----------+--------+--------+--------+------------------------+--------+ CCA Distal69      22                                               +----------+--------+--------+--------+------------------------+--------+  ICA Prox  77      32      1-39%   diffuse and heterogenous         +----------+--------+--------+--------+------------------------+--------+ ICA Mid   58      21                                               +----------+--------+--------+--------+------------------------+--------+ ICA Distal78      30                                               +----------+--------+--------+--------+------------------------+--------+ ECA       96      12                                               +----------+--------+--------+--------+------------------------+--------+ +----------+--------+-------+--------+-------------------+           PSV cm/sEDV cmsDescribeArm Pressure (mmHG) +----------+--------+-------+--------+-------------------+ Subclavian119                                        +----------+--------+-------+--------+-------------------+  +---------+--------+--+--------+-+---------+ VertebralPSV cm/s26EDV cm/s5Antegrade +---------+--------+--+--------+-+---------+  Left Carotid Findings: +----------+--------+--------+--------+------------------------+--------+           PSV cm/sEDV cm/sStenosisDescribe                Comments +----------+--------+--------+--------+------------------------+--------+ CCA Prox  55      15              diffuse and heterogenoustortuous +----------+--------+--------+--------+------------------------+--------+ CCA Distal52      10                                               +----------+--------+--------+--------+------------------------+--------+ ICA Prox  78      26      1-39%   diffuse and calcific             +----------+--------+--------+--------+------------------------+--------+ ICA Mid   89      39                                               +----------+--------+--------+--------+------------------------+--------+ ICA Distal80      31                                               +----------+--------+--------+--------+------------------------+--------+ ECA       79      14                                               +----------+--------+--------+--------+------------------------+--------+ +----------+--------+--------+--------+-------------------+ SubclavianPSV cm/sEDV cm/sDescribeArm Pressure (mmHG) +----------+--------+--------+--------+-------------------+  109                                         +----------+--------+--------+--------+-------------------+ +---------+--------+--+--------+--+---------+ VertebralPSV cm/s65EDV cm/s22Antegrade +---------+--------+--+--------+--+---------+  Summary: Right Carotid: Velocities in the right ICA are consistent with a 1-39% stenosis. Left Carotid: Velocities in the left ICA are consistent with a 1-39% stenosis. Vertebrals: Bilateral vertebral arteries demonstrate antegrade flow.  *See table(s) above for measurements and observations.  Electronically signed by Antony Contras MD on 05/25/2018 at 12:47:48 PM.    Final        Medical Problem List and Plan: 1.  Left side weakness secondary to left MCA infarction, high-grade left MCA M1 stenosis.Plan   outpatient cerebral angiogram question angioplasty stenting 06/06/2018 at 7:30 AM per interventional radiology  -admit to inpatient rehab 2.  DVT Prophylaxis/Anticoagulation: Subcutaneous Lovenox. Monitor for any bleeding episodes 3. Pain Management:  Tylenol as needed 4. Mood:  Provide emotional support 5. Neuropsych: This patient is capable of making decisions on his own behalf. 6. Skin/Wound Care:   Routine skin checks 7. Fluids/Electrolytes/Nutrition:  Routine ins and outs with follow-up chemistries 8.  History of CAD with CABG. No chest pain or shortness of breath 9. Diastolic congestive heart failure. Monitor for signs of fluid overload. Patient on Lasix 20 mg daily prior to admission. Resume as needed  -check daily weights 10. Diabetes mellitus peripheral neuropathy. Hemoglobin A1c 8.2. SSI. Patient Amaryl 4 mg twice a day, Glucophage 1000 mg twice a day prior to admission. Resume as needed 11. Hypertension. Permissive hypertension. Patient on Norvasc 10 mg daily, Coreg 3.125 mg every a.m. and 6.25 mg every p.m.,Imdur 60 mg twice a day, hydralazine 100 mg twice a day prior to admission and resume as needed 12. Chronic renal insufficiency. Baseline creatinine 1.41. 13. Hyperlipidemia. Crestor 14. Chronic anemia. Continue iron supplement   Post Admission Physician Evaluation: 1. Functional deficits secondary  to left MCA infarct. 2. Patient is admitted to receive collaborative, interdisciplinary care between the physiatrist, rehab nursing staff, and therapy team. 3. Patient's level of medical complexity and substantial therapy needs in context of that medical necessity cannot be provided at a lesser intensity of care  such as a SNF. 4. Patient has experienced substantial functional loss from his/her baseline which was documented above under the "Functional History" and "Functional Status" headings.  Judging by the patient's diagnosis, physical exam, and functional history, the patient has potential for functional progress which will result in measurable gains while on inpatient rehab.  These gains will be of substantial and practical use upon discharge  in facilitating mobility and self-care at the household level. 5. Physiatrist will provide 24 hour management of medical needs as well as oversight of the therapy plan/treatment and provide guidance as appropriate regarding the interaction of the two. 6. The Preadmission Screening has been reviewed and patient status is unchanged unless otherwise stated above. 7. 24 hour rehab nursing will assist with bladder management, bowel management, safety, skin/wound care, disease management, medication administration, pain management and patient education  and help integrate therapy concepts, techniques,education, etc. 8. PT will assess and treat for/with: Lower extremity strength, range of motion, stamina, balance, functional mobility, safety, adaptive techniques and equipment, NMR, family education.   Goals are: mod I. 9. OT will assess and treat for/with: ADL's, functional mobility, safety, upper extremity strength, adaptive techniques and equipment, NMR, family education, community reentry.   Goals  are: mod I. Therapy may proceed with showering this patient. 10. SLP will assess and treat for/with: n/a.  Goals are: n/a. 11. Case Management and Social Worker will assess and treat for psychological issues and discharge planning. 12. Team conference will be held weekly to assess progress toward goals and to determine barriers to discharge. 13. Patient will receive at least 3 hours of therapy per day at least 5 days per week. 14. ELOS: 5-7 days       15. Prognosis:   excellent   I have personally performed a face to face diagnostic evaluation of this patient and formulated the key components of the plan.  Additionally, I have personally reviewed laboratory data, imaging studies, as well as relevant notes and concur with the physician assistant's documentation above.  Meredith Staggers, MD, FAAPMR    Lavon Paganini [place], PA-C 05/27/2018

## 2018-05-26 NOTE — Consult Note (Signed)
Chief Complaint: Patient was seen in consultation today for left MCA stenosis.  Referring Physician(s): Dr. Leonie Man  Supervising Physician: Luanne Bras  Patient Status: [facility] - In-pt  History of Present Illness: [name] is a 85 y.o. male with a past medical history significant for AAA. CAD s/p CABG, CHF, NSTEMI (2012, 2019), STEMI (2017), HTN, HLD,   presence of suprapubic catheter, DM and CVA (2014, 2016) who presented to [facility] ED via EMS on 05/24/18 as a CODE STROKE due to slurred speech and right facial droop. Patient is well known to [facility] service - he was initially seen for diagnostic cerebral angiogram 01/23/2015 with Dr. Estanislado Pandy which showed 95% stenosis of the petrous portion of the left ICA as well as 50% stenosis of the left vertebral artery at C1-2, 50% narrowing of the mid basilar artery and non-visualization of the right vertebral artery consistent with occlusion. He underwent balloon angioplasty of the left ICA petrous segment on 01/25/2015 and was seen in follow up with Dr. Estanislado Pandy on 02/12/15, 01/21/16, 02/25/17 (diagnostic angiogram), 10/19/17 (diagnostic angiogram) and 11/09/17. He was scheduled for routine MRI/MRA brain on 05/27/18 however this was performed on 05/24/18 due to [place] admission. MRI/MRA brain showed multifocal acute ischemia within the left MCA territory, predominantly cortical and subcortical and near the watershed zones at he ACA/MCA and MCA/PCA junctions; small focus of ischemia in the right frontal white matter. CTA head/neck this admission showed severe stenosis of the distal left M1 segment with preserved distal patency; attenuated appearance of the right vertebral artery with complete loss of patency at the proximal V3 segment; right PICA supplied via collaterals from left PICA; 60% stenosis of the proximal left ICA. Request has been made to [facility] to coordinate possible outpatient intervention.   Patient reports he feels pretty good, "much better than when I  got here!" He states his right arm "more dead than before" but he is otherwise at baseline. He is looking forward to going home soon. He has been walking in the hallways easily with 1 person assist and around his room without assistance at times. He denies any complaints.   Past Medical History:  Diagnosis Date  . Abdominal aortic aneurysm ([place])    a. Aortic duplex 06/2014: mild aneurysmal dilatation of proximal abdominal aorta measuring 3.4x3.4cm. No sig change from 2012. F/u due 06/2016;   . AKI (acute kidney injury) ([place]) 03/28/2016  . Arthritis    "right knee; never bothered me" (03/26/2016)  . 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 CAD in native artery    a. NSTEMI 11/2010 - CABG x2(LIMA to LAD, SVG to PDA). b. NEG Lexi MV 10/24/13, EF 53%, no perfusion abnormality, septal and apical HK noted. c. NSTEMI 05/2014 - s/p DES to SVG-RPDA 06/18/14 (Xience Alpine DES 3.0 x 18 mm -3.35 mm), EF 60-65; d. 03/2016 STEMI/PCI: LM nl, ost LAD 70%, mLCx 50%, pRCA 95% - mRCA 60%, dRPDA 70%, LIMA->LAD ok, o-p SVG->RPDA 100% (3.0x16 Promus DES overlapping prior stent).  . Diastolic dysfunction    a. 03/2016 Echo: EF 60-65%, no rwma, Gr1 DD, triv AI, Ao root 43mm, Asc Ao 73mm, triv MR.  . Dizziness    a. Carotid duplex 03/2014: mild fibrous plaque, no significant stenosis.  . Foley catheter in place    "been wearing it for a couple months now" (03/26/2016)  . HTN (hypertension)   . Hyperlipidemia LDL goal <70   . Non-STEMI (non-ST elevated myocardial infarction) (  [place]) 11/2010; 03/2018   1. Ostial LAD 70% (to close to [facility] for PCI), subtotal occlusion of the RCA; 2.  In-stent restenosis SVG-RCA -PTCA.  Marland Kitchen Postoperative atrial fibrillation ([place]) 11/2010   Post CABG, no sign recurrence  . Refusal of blood transfusions as patient is Jehovah's Witness   . 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 Servando Snare)  . Sleep apnea    Not on CPAP. (03/26/2016)  . ST elevation  myocardial infarction (STEMI) of inferior wall ([place]) 04/05/2016   Occluded in-stent restenosis/thrombosis of SVG-RCA --> treated with overlapping Promus DES 3.0 mm 16 mm postdilated 3.0 mm).  . Stroke [facility]) 2014; 01/2015   a. 2014 with mild right hand weakness, nonhemorrhagic per pt.;; b. - PTA-Stent L ICA 95%   . Type II diabetes mellitus ([place])     Past Surgical History:  Procedure Laterality Date  . APPENDECTOMY    . CARDIAC CATHETERIZATION  12/11/2010   Dr. Chase Picket - subsequent cath - normal LV systolic function, no renal artery stenosis, severe 2-vessel disease with subtotaled RCA prox and distal 60% lesion and complex 70% area of narrowing of ostium of LAD  . CARDIAC CATHETERIZATION N/A 03/26/2016   Procedure: Left Heart Cath and Coronary Angiography;  Surgeon: Nelva [place], MD;  Location: [place] CV LAB;  Service: Cardiovascular;  Laterality: N/A;  . CARDIAC CATHETERIZATION N/A 03/26/2016   Procedure: Coronary Stent Intervention;  Surgeon: Nelva [place], MD;  Location: [place] CV LAB;  Service: Cardiovascular: 100% In-stent thrombosis of pros SVG-RCA (Xience DES) --> treated with PromusDES 3.0 x 18 (3.3 mm)  . CARDIAC CATHETERIZATION N/A 03/26/2016   Procedure: Bypass Graft Angiography;  Surgeon: Nelva [place], MD;  Location: [place] CV LAB;  Service: Cardiovascular;  Laterality: N/A;  . CORONARY ANGIOPLASTY WITH STENT PLACEMENT  06/18/2014   PCI to SVG-RPDA 06/18/14 (Xience Alpine DES 3.0 x 18 mm -3.35 mm),   . CORONARY ARTERY BYPASS GRAFT  12/15/2010   X2, Dr Servando Snare; LIMA to LAD, SVG to PDA;   . CORONARY BALLOON ANGIOPLASTY N/A 04/18/2018   Procedure: CORONARY BALLOON ANGIOPLASTY;  Surgeon: Leonie Man, MD;  Location: [place] CV LAB;  Service: Cardiovascular;;; high pressure scoring and noncompliant balloon PTCA of SVG-RCA ISR ostial and proximal  . CORONARY/GRAFT ANGIOGRAPHY N/A 08/27/2017   Procedure: CORONARY/GRAFT ANGIOGRAPHY;  Surgeon: Nelva [place],  MD;  Location: [place] CV LAB;  Service: Cardiovascular;; pLAD 70%, ostD1 50%.  mCx 60%, OM2 80%. pRCA 95% & mRCA 100% - rPDA 70%. LIMA-mLAD patent. SVG-rPDA 10% ISR.   Marland Kitchen CYSTOSCOPY WITH URETHRAL DILATATION    . IR ANGIO INTRA EXTRACRAN SEL COM CAROTID INNOMINATE BILAT MOD SED  02/25/2017  . IR ANGIO INTRA EXTRACRAN SEL COM CAROTID INNOMINATE BILAT MOD SED  10/19/2017   Dr. Kathee Delton: L Common Carotid - ECA & major branches widely patent. ICA ~20% distal to bulb & 50% in supraclinoid segment. LMCA-distal 1/3 MI ~905 stenosis with post-stenotic dilation into inferior division. ~50% prox Basilar A stenosis @ anterior Inf Cerebellar A. 50% R ICA  . IR ANGIO VERTEBRAL SEL SUBCLAVIAN INNOMINATE UNI L MOD SED  02/25/2017  . IR ANGIO VERTEBRAL SEL SUBCLAVIAN INNOMINATE UNI R MOD SED  10/19/2017  . IR ANGIO VERTEBRAL SEL VERTEBRAL UNI L MOD SED  10/19/2017  . IR GENERIC HISTORICAL  01/21/2016   IR RADIOLOGIST EVAL & MGMT 01/21/2016 [facility]-INTERV RAD  . IR GENERIC HISTORICAL  02/03/2016   IR CATHETER TUBE CHANGE  02/03/2016 Marybelle Killings, MD [facility]-INTERV RAD  . IR RADIOLOGIST EVAL & MGMT  11/09/2017  . LEFT HEART CATH AND CORONARY ANGIOGRAPHY N/A 04/18/2018   Procedure: LEFT HEART CATH AND CORONARY ANGIOGRAPHY;  Surgeon: Leonie Man, MD;  Location: [place] CV LAB;  Service: Cardiovascular;  Laterality: N/A; stable findings on last cath with exception of 75% in-stent restenosis of SVG-RCA ostial stent treated with PTCA.    Marland Kitchen LEFT HEART CATHETERIZATION WITH CORONARY ANGIOGRAM N/A 06/18/2014   Procedure: LEFT HEART CATHETERIZATION WITH CORONARY ANGIOGRAM;  Surgeon: Leonie Man, MD;  Location: [facility] CATH LAB;  Service: Cardiovascular;  -- severe disease of SVG-rPDA  . RADIOLOGY WITH ANESTHESIA N/A 01/24/2015   Procedure: STENT ASSISTED ANGIOPLASTY (RADIOLOGY WITH ANESTHESIA);  Surgeon: Luanne Bras, MD;  Location: [place];  Service: Radiology;  Laterality: N/A;  . TONSILLECTOMY    . TRANSTHORACIC ECHOCARDIOGRAM   08/2017; 04/2018:    A) EF 60-65%. Mild LVH. No RWMA. Gr 1 DD. Mod LA dilation.;  B)  EF 35-40%.  Severe LVH.  GRII DD.  Apical anteroseptal hypokinesis.  Akinesis of the apex.    Allergies: Other; Statins; Atorvastatin; Crestor [rosuvastatin]; Pravastatin; and Simvastatin  Medications: Prior to Admission medications   Medication Sig Start Date End Date Taking? Authorizing Provider  amLODipine (NORVASC) 10 MG tablet Take 10 mg by mouth every evening.    Yes [provider]  aspirin 81 MG chewable tablet Chew 1 tablet (81 mg total) by mouth daily. 03/29/16  Yes Isaiah Serge, NP  carvedilol (COREG) 6.25 MG tablet Take 0.5 tablets (3.125 mg total) by mouth every morning AND 1 tablet (6.25 mg total) every evening. Patient taking differently: Take 3.125 mg by mouth in the morning and 6.25 mg in the evening 04/26/18  Yes Leonie Man, MD  ciprofloxacin (CIPRO) 500 MG tablet Take 500 mg by mouth 2 (two) times daily. FOR 10 DAYS 05/23/18 06/01/18 Yes [provider]  clopidogrel (PLAVIX) 75 MG tablet Take 1 tablet (75 mg total) by mouth daily. 09/22/17  Yes Croitoru, Mihai, MD  doxazosin (CARDURA) 1 MG tablet Take 1 tablet (1 mg total) by mouth daily. 04/21/18  Yes Dessa Phi, DO  Ferrous Sulfate Dried (FERROUS SULFATE IRON) 200 (65 Fe) MG TABS Take 1 tablet by mouth 2 (two) times daily after a meal.   Yes [provider]  furosemide (LASIX) 20 MG tablet Take 1 tablet (20 mg total) by mouth daily. 04/21/18  Yes Dessa Phi, DO  glimepiride (AMARYL) 4 MG tablet Take 4 mg by mouth 2 (two) times daily with a meal.    Yes [provider]  isosorbide mononitrate (IMDUR) 60 MG 24 hr tablet Take 1 tablet (60 mg total) by mouth 2 (two) times daily. 01/28/18  Yes Eulogio Bear U, DO  metFORMIN (GLUCOPHAGE) 1000 MG tablet Take 1 tablet (1,000 mg total) by mouth 2 (two) times daily. 03/29/16  Yes Isaiah Serge, NP  nitroGLYCERIN (NITROSTAT) 0.4 MG SL tablet Place 1  tablet (0.4 mg total) under the tongue every 5 (five) minutes as needed for chest pain (up to 3 doses). Patient taking differently: Place 0.4 mg under the tongue every 5 (five) minutes x 3 doses as needed for chest pain.  08/25/17  Yes Almyra Deforest, PA  oxybutynin (DITROPAN) 5 MG tablet Take 5 mg by mouth daily as needed for bladder spasms.  09/07/17  Yes [provider]  carvedilol (COREG) 12.5 MG tablet Take 6.25 mg by mouth 2 (two)  times daily with a meal.    [provider]  hydrALAZINE (APRESOLINE) 100 MG tablet Take 1 tablet (100 mg total) by mouth 2 (two) times daily. DISCONTINUE PERVIOUS DOSE  PATIENT WILL CALL WHEN REFILL IS NEEDED Patient not taking: Reported on 05/24/2018 04/26/18   Leonie Man, MD     Family History  Problem Relation Age of Onset  . Heart Problems Father 91    Social History   Socioeconomic History  . Marital status: Married    Spouse name: [name]  . Number of children: 1  . Years of education: Not on file  . Highest education level: Not on file  Occupational History    Employer: RETIRED  Social Needs  . Financial resource strain: Not very hard  . Food insecurity:    Worry: Never true    Inability: Never true  . Transportation needs:    Medical: No    Non-medical: No  Tobacco Use  . Smoking status: Former Smoker    Packs/day: 0.33    Years: 10.00    Pack years: 3.30    Types: Cigarettes    Last attempt to quit: 1961    Years since quitting: 59.0  . Smokeless tobacco: Never Used  Substance and Sexual Activity  . Alcohol use: No  . Drug use: No  . Sexual activity: Never  Lifestyle  . Physical activity:    Days per week: 6 days    Minutes per session: 30 min  . Stress: Not at all  Relationships  . Social connections:    Talks on phone: More than three times a week    Gets together: More than three times a week    Attends religious service: 1 to 4 times per year    Active member of club or organization: Yes    Attends  meetings of clubs or organizations: 1 to 4 times per year    Relationship status: Married  Other Topics Concern  . Not on file  Social History Narrative   Married father of one.  Previously uses stationary bike routinely.  Now reduced due to other social stressors.   Quit smoking 50 years ago.  Does not drink alcohol     Review of Systems: A 12 point ROS discussed and pertinent positives are indicated in the HPI above.  All other systems are negative.  Review of Systems  Constitutional: Negative for chills and fever.  Respiratory: Negative for cough and shortness of breath.   Cardiovascular: Negative for chest pain.  Gastrointestinal: Negative for abdominal pain, diarrhea and vomiting.  Musculoskeletal: Negative for back pain.  Neurological: Positive for weakness (RUE). Negative for dizziness, syncope, light-headedness, numbness and headaches.    Vital Signs: BP 130/72 (BP Location: Right Arm)   Pulse (!) 54   Temp 97.9 F (36.6 C) (Oral)   Resp 18   Ht 6\' 1"  (1.854 m)   Wt 216 lb 4.3 oz (98.1 kg)   SpO2 99%   BMI 28.53 kg/m   Physical Exam Vitals signs and nursing note reviewed.  Constitutional:      General: He is not in acute distress.    Appearance: Normal appearance. He is not ill-appearing.     Comments: Sitting in recliner eating lunch. Pleasant, talkative.  HENT:     Head: Normocephalic and atraumatic.  Cardiovascular:     Rate and Rhythm: Normal rate and regular rhythm.  Pulmonary:     Effort: Pulmonary effort is normal.  Breath sounds: Normal breath sounds.  Abdominal:     General: There is no distension.     Palpations: Abdomen is soft.     Tenderness: There is no abdominal tenderness.  Skin:    General: Skin is warm and dry.  Neurological:     Mental Status: He is alert and oriented to person, place, and time.     Comments: Able to raise RUE at shoulder/bend at elbow - unable to move right fingers/hand.   Psychiatric:        Mood and Affect: Mood  normal.        Behavior: Behavior normal.        Thought Content: Thought content normal.        Judgment: Judgment normal.      Imaging: Ct Angio Head W Or Wo Contrast  Result Date: 05/24/2018 CLINICAL DATA:  Slurred speech and right facial droop EXAM: CT ANGIOGRAPHY HEAD AND NECK TECHNIQUE: Multidetector CT imaging of the head and neck was performed using the standard protocol during bolus administration of intravenous contrast. Multiplanar CT image reconstructions and MIPs were obtained to evaluate the vascular anatomy. Carotid stenosis measurements (when applicable) are obtained utilizing NASCET criteria, using the distal internal carotid diameter as the denominator. CONTRAST:  156mL ISOVUE-370 IOPAMIDOL (ISOVUE-370) INJECTION 76% COMPARISON:  Head CT 05/24/2018 FINDINGS: CTA NECK FINDINGS SKELETON: Lower cervical degenerative disc disease. No bony spinal canal stenosis. OTHER NECK: Normal pharynx, larynx and major salivary glands. No cervical lymphadenopathy. Unremarkable thyroid gland. UPPER CHEST: No pneumothorax or pleural effusion. No nodules or masses. AORTIC ARCH: There is mild calcific atherosclerosis of the aortic arch. There is no aneurysm, dissection or hemodynamically significant stenosis of the visualized ascending aorta and aortic arch. Conventional 3 vessel aortic branching pattern. The visualized proximal subclavian arteries are widely patent. RIGHT CAROTID SYSTEM: --Common carotid artery: Widely patent origin without common carotid artery dissection or aneurysm. --Internal carotid artery: No dissection, occlusion or aneurysm. Mild atherosclerotic calcification at the carotid bifurcation without hemodynamically significant stenosis. --External carotid artery: No acute abnormality. LEFT CAROTID SYSTEM: --Common carotid artery: Widely patent origin without common carotid artery dissection or aneurysm. --Internal carotid artery: No dissection, occlusion or aneurysm. There is mixed density  atherosclerosis extending into the proximal ICA, resulting in 60% stenosis. --External carotid artery: No acute abnormality. VERTEBRAL ARTERIES: Left dominant configuration. Both origins are normal. The right vertebral artery is diminutive along its entire course with attenuated contrast-enhancement. There is complete loss of opacification beyond the proximal V3 segment. The dominant left vertebral artery is widely patent along its entire course. There is atherosclerotic calcification of the V4 segment without hemodynamically significant stenosis. CTA HEAD FINDINGS POSTERIOR CIRCULATION: --Basilar artery: Normal. --Posterior cerebral arteries: Normal. The right PCA is predominantly supplied by the posterior communicating artery. --Superior cerebellar arteries: Normal. --Inferior cerebellar arteries: Normal AICA bilaterally. The right PICA is supplied from the left PICA. ANTERIOR CIRCULATION: --Intracranial internal carotid arteries: Moderate atherosclerotic calcification. --Anterior cerebral arteries: Normal. Both A1 segments are present. Patent anterior communicating artery. --Middle cerebral arteries: There is severe stenosis of the distal left M1 segment, which is otherwise normal caliber distally. The right middle cerebral artery is normal. --Posterior communicating arteries: Right p-comm gives rise to the right PCA. No left p-comm is visualized. VENOUS SINUSES: As permitted by contrast timing, patent. ANATOMIC VARIANTS: Fetal origin of the right posterior cerebral artery. DELAYED PHASE: Not performed Review of the MIP images confirms the above findings. IMPRESSION: 1. No emergent large vessel occlusion. 2. Severe  stenosis of the distal left M1 segment, with preserved distal patency. 3. Attenuated appearance of the right vertebral artery with complete loss of patency at the proximal V3 segment. Right PICA supplied via collaterals from left PICA. 4. 60% stenosis of the proximal left internal carotid artery. These  results were communicated to Dr. Karena Addison Aroor at 2:52 pm on 05/24/2018 by text page via the [facility] messaging system. Electronically Signed   By: Ulyses Jarred M.D.   On: 05/24/2018 14:51   Ct Angio Neck W Or Wo Contrast  Result Date: 05/24/2018 CLINICAL DATA:  Slurred speech and right facial droop EXAM: CT ANGIOGRAPHY HEAD AND NECK TECHNIQUE: Multidetector CT imaging of the head and neck was performed using the standard protocol during bolus administration of intravenous contrast. Multiplanar CT image reconstructions and MIPs were obtained to evaluate the vascular anatomy. Carotid stenosis measurements (when applicable) are obtained utilizing NASCET criteria, using the distal internal carotid diameter as the denominator. CONTRAST:  12mL ISOVUE-370 IOPAMIDOL (ISOVUE-370) INJECTION 76% COMPARISON:  Head CT 05/24/2018 FINDINGS: CTA NECK FINDINGS SKELETON: Lower cervical degenerative disc disease. No bony spinal canal stenosis. OTHER NECK: Normal pharynx, larynx and major salivary glands. No cervical lymphadenopathy. Unremarkable thyroid gland. UPPER CHEST: No pneumothorax or pleural effusion. No nodules or masses. AORTIC ARCH: There is mild calcific atherosclerosis of the aortic arch. There is no aneurysm, dissection or hemodynamically significant stenosis of the visualized ascending aorta and aortic arch. Conventional 3 vessel aortic branching pattern. The visualized proximal subclavian arteries are widely patent. RIGHT CAROTID SYSTEM: --Common carotid artery: Widely patent origin without common carotid artery dissection or aneurysm. --Internal carotid artery: No dissection, occlusion or aneurysm. Mild atherosclerotic calcification at the carotid bifurcation without hemodynamically significant stenosis. --External carotid artery: No acute abnormality. LEFT CAROTID SYSTEM: --Common carotid artery: Widely patent origin without common carotid artery dissection or aneurysm. --Internal carotid artery: No dissection,  occlusion or aneurysm. There is mixed density atherosclerosis extending into the proximal ICA, resulting in 60% stenosis. --External carotid artery: No acute abnormality. VERTEBRAL ARTERIES: Left dominant configuration. Both origins are normal. The right vertebral artery is diminutive along its entire course with attenuated contrast-enhancement. There is complete loss of opacification beyond the proximal V3 segment. The dominant left vertebral artery is widely patent along its entire course. There is atherosclerotic calcification of the V4 segment without hemodynamically significant stenosis. CTA HEAD FINDINGS POSTERIOR CIRCULATION: --Basilar artery: Normal. --Posterior cerebral arteries: Normal. The right PCA is predominantly supplied by the posterior communicating artery. --Superior cerebellar arteries: Normal. --Inferior cerebellar arteries: Normal AICA bilaterally. The right PICA is supplied from the left PICA. ANTERIOR CIRCULATION: --Intracranial internal carotid arteries: Moderate atherosclerotic calcification. --Anterior cerebral arteries: Normal. Both A1 segments are present. Patent anterior communicating artery. --Middle cerebral arteries: There is severe stenosis of the distal left M1 segment, which is otherwise normal caliber distally. The right middle cerebral artery is normal. --Posterior communicating arteries: Right p-comm gives rise to the right PCA. No left p-comm is visualized. VENOUS SINUSES: As permitted by contrast timing, patent. ANATOMIC VARIANTS: Fetal origin of the right posterior cerebral artery. DELAYED PHASE: Not performed Review of the MIP images confirms the above findings. IMPRESSION: 1. No emergent large vessel occlusion. 2. Severe stenosis of the distal left M1 segment, with preserved distal patency. 3. Attenuated appearance of the right vertebral artery with complete loss of patency at the proximal V3 segment. Right PICA supplied via collaterals from left PICA. 4. 60% stenosis of  the proximal left internal carotid artery. These results were  communicated to Dr. Karena Addison Aroor at 2:52 pm on 05/24/2018 by text page via the [facility] messaging system. Electronically Signed   By: Ulyses Jarred M.D.   On: 05/24/2018 14:51   Mr Brain Wo Contrast  Result Date: 05/24/2018 CLINICAL DATA:  Right facial droop EXAM: MRI HEAD WITHOUT CONTRAST TECHNIQUE: Multiplanar, multiecho pulse sequences of the brain and surrounding structures were obtained without intravenous contrast. COMPARISON:  CTA head neck 05/24/2018 FINDINGS: BRAIN: There is multifocal cortical and subcortical diffusion restriction within the left MCA territory. Additionally, there is a focus of abnormal diffusion restriction in the right frontal white matter. The midline structures are normal. No midline shift or other mass effect. There are old bilateral lacunar infarcts. There is an old left frontoparietal infarct. Multifocal periventricular white matter hyperintensity, most often a result of chronic microvascular ischemia. Generalized atrophy without lobar predilection. Susceptibility-sensitive sequences show no chronic microhemorrhage or superficial siderosis. VASCULAR: Major intracranial arterial and venous sinus flow voids are normal. SKULL AND UPPER CERVICAL SPINE: Calvarial bone marrow signal is normal. There is no skull base mass. Visualized upper cervical spine and soft tissues are normal. SINUSES/ORBITS: No fluid levels or advanced mucosal thickening. No mastoid or middle ear effusion. The orbits are normal. IMPRESSION: 1. Multifocal acute ischemia within the left MCA territory, predominantly cortical and subcortical and near the watershed zones at the ACA/MCA and MCA/PCA junctions. These could be watershed or embolic infarcts. 2. Additional small focus of ischemia in the right frontal white matter, more suggestive of embolic etiology. 3. Chronic ischemic microangiopathy. 4. No hemorrhage or mass effect. Electronically Signed   By:  Ulyses Jarred M.D.   On: 05/24/2018 21:05   Ct Head Code Stroke Wo Contrast  Result Date: 05/24/2018 CLINICAL DATA:  Code stroke. 85 y/o M; slurred speech and right facial droop. EXAM: CT HEAD WITHOUT CONTRAST TECHNIQUE: Contiguous axial images were obtained from the base of the skull through the vertex without intravenous contrast. COMPARISON:  12/13/2017 CT head. FINDINGS: Brain: No evidence of acute infarction, hemorrhage, hydrocephalus, extra-axial collection or mass lesion/mass effect. Stable small chronic left frontoparietal infarct and left lentiform nucleus lacunar infarct. Stable nonspecific white matter hypodensities compatible with chronic microvascular ischemic changes. Stable volume loss of the brain. Vascular: Calcific atherosclerosis of carotid siphons. No hyperdense vessel identified. Skull: Normal. Negative for fracture or focal lesion. Sinuses/Orbits: Mild maxillary and ethmoid sinus mucosal thickening. Normal aeration of the mastoid air cells. Other: None. ASPECTS [facility] Stroke Program Early CT Score) - Ganglionic level infarction (caudate, lentiform nuclei, internal capsule, insula, M1-M3 cortex): 7 - Supraganglionic infarction (M4-M6 cortex): 3 Total score (0-10 with 10 being normal): 10 IMPRESSION: 1. No acute intracranial abnormality identified. 2. Stable small chronic left frontoparietal infarct. Stable chronic microvascular ischemic changes and volume loss of the brain. 3. ASPECTS is 10 These results were communicated to Dr. Lorraine Lax at 2:22 pmon 1/7/2020by text page via the [facility] messaging system. Electronically Signed   By: Kristine Garbe M.D.   On: 05/24/2018 14:25   Vas US Carotid  Result Date: 05/25/2018 Carotid Arterial Duplex Study Indications:       Stroke, Carotid stenosis on left. Other Factors:     Coronary Balloon Angioplasty on 04/18/2018. Limitations:       Vessels tortuosity Comparison Study:  Carotid duplex exam on 03/29/2014 Performing Technologist: Rudell Cobb  Examination Guidelines: A complete evaluation includes B-mode imaging, spectral Doppler, color Doppler, and power Doppler as needed of all accessible portions of each vessel. Bilateral testing is  considered an integral part of a complete examination. Limited examinations for reoccurring indications may be performed as noted.  Right Carotid Findings: +----------+--------+--------+--------+------------------------+--------+           PSV cm/sEDV cm/sStenosisDescribe                Comments +----------+--------+--------+--------+------------------------+--------+ CCA Prox  119     22              focal and calcific      tortuous +----------+--------+--------+--------+------------------------+--------+ CCA Distal69      22                                               +----------+--------+--------+--------+------------------------+--------+ ICA Prox  77      32      1-39%   diffuse and heterogenous         +----------+--------+--------+--------+------------------------+--------+ ICA Mid   58      21                                               +----------+--------+--------+--------+------------------------+--------+ ICA Distal78      30                                               +----------+--------+--------+--------+------------------------+--------+ ECA       96      12                                               +----------+--------+--------+--------+------------------------+--------+ +----------+--------+-------+--------+-------------------+           PSV cm/sEDV cmsDescribeArm Pressure (mmHG) +----------+--------+-------+--------+-------------------+ Subclavian119                                        +----------+--------+-------+--------+-------------------+ +---------+--------+--+--------+-+---------+ VertebralPSV cm/s26EDV cm/s5Antegrade +---------+--------+--+--------+-+---------+  Left Carotid Findings:  +----------+--------+--------+--------+------------------------+--------+           PSV cm/sEDV cm/sStenosisDescribe                Comments +----------+--------+--------+--------+------------------------+--------+ CCA Prox  55      15              diffuse and heterogenoustortuous +----------+--------+--------+--------+------------------------+--------+ CCA Distal52      10                                               +----------+--------+--------+--------+------------------------+--------+ ICA Prox  78      26      1-39%   diffuse and calcific             +----------+--------+--------+--------+------------------------+--------+ ICA Mid   89      39                                               +----------+--------+--------+--------+------------------------+--------+  ICA Distal80      31                                               +----------+--------+--------+--------+------------------------+--------+ ECA       79      14                                               +----------+--------+--------+--------+------------------------+--------+ +----------+--------+--------+--------+-------------------+ SubclavianPSV cm/sEDV cm/sDescribeArm Pressure (mmHG) +----------+--------+--------+--------+-------------------+           109                                         +----------+--------+--------+--------+-------------------+ +---------+--------+--+--------+--+---------+ VertebralPSV cm/s65EDV cm/s22Antegrade +---------+--------+--+--------+--+---------+  Summary: Right Carotid: Velocities in the right ICA are consistent with a 1-39% stenosis. Left Carotid: Velocities in the left ICA are consistent with a 1-39% stenosis. Vertebrals: Bilateral vertebral arteries demonstrate antegrade flow. *See table(s) above for measurements and observations.  Electronically signed by Antony Contras MD on 05/25/2018 at 12:47:48 PM.    Final     Labs:  CBC: Recent  Labs    04/27/18 0932 05/24/18 1406 05/24/18 1412 05/25/18 0436 05/26/18 0538  WBC 5.5 5.3  --  4.8 5.6  HGB 8.8* 8.4* 9.2* 7.9* 7.9*  HCT 26.7* 28.1* 27.0* 24.9* 24.7*  PLT 372 229  --  221 227    COAGS: Recent Labs    08/25/17 1009 10/19/17 0818 05/24/18 1406  INR 1.0 1.02 1.11  APTT 29  --  32    BMP: Recent Labs    04/28/18 1231 05/24/18 1406 05/24/18 1412 05/25/18 0436 05/26/18 0538  NA 140 140 141 140 138  K 5.6* 4.0 4.0 3.3* 3.6  CL 102 109 107 108 107  CO2 23 23  --  24 23  GLUCOSE 176* 67* 64* 68* 109*  BUN 35* 17 19 15 19   CALCIUM 9.3 8.9  --  8.6* 8.4*  CREATININE 1.67* 1.41* 1.40* 1.33* 1.47*  GFRNONAA 37* 45*  --  48* 43*  GFRAA 42* 52*  --  56* 50*    LIVER FUNCTION TESTS: Recent Labs    12/18/17 1351 04/19/18 0259 04/27/18 0932 05/24/18 1406  BILITOT 1.0 0.4 0.2 0.5  AST 16 20 8 15   ALT 10 10 8 9   ALKPHOS 67 56 76 55  PROT 6.3* 5.3* 6.3 6.6  ALBUMIN 3.6 3.0* 4.1 3.4*    TUMOR MARKERS: No results for input(s): AFPTM, CEA, CA199, CHROMGRNA in the last 8760 hours.  Assessment and Plan:  Patient well known to [facility] service - previous balloon angioplasty of left ICA petrous segment on 01/25/15 with Dr. Estanislado Pandy as well as regular follow up appointments listed above. Briefly, he presented [facility] ED on 05/24/18 due to slurred speech and right sided facial drooping - MRI/MRA brain showed multifocal acute ischemia within the left MCA territory, predominantly cortical and subcortical and near the watershed zones at he ACA/MCA and MCA/PCA junctions; small focus of ischemia in the right frontal white matter. CTA head/neck this admission showed severe stenosis of the distal left M1 segment with preserved distal patency; attenuated appearance of the right  vertebral artery with complete loss of patency at the proximal V3 segment; right PICA supplied via collaterals from left PICA; 60% stenosis of the proximal left ICA.   Request made to [facility] for possible  outpatient intervention -- case discussed with Dr. Estanislado Pandy today who agrees to intervention.   Patient scheduled for outpatient cerebral angiogram with likely angioplasty/stenting on 06/06/18 at 7:30 am - this was discussed with patient today at bedside, information sheet with pre-procedure instructions given and information has also been included under follow up provider section of d/c summary. Patient states understanding and agrees to procedure.   Continue current medications. Appreciate and agree with neurology management.  Please call [facility] with any questions or concerns.   Thank you for this interesting consult.  I greatly enjoyed meeting HEINZ ECKERT and look forward to participating in their care.  A copy of this report was sent to the requesting provider on this date.  Electronically Signed: Joaquim Nam, PA-C 05/26/2018, 11:10 AM   I spent a total of 40 Minutes in face to face in clinical consultation, greater than 50% of which was counseling/coordinating care for cerebral angiogram with possible angioplasty/stenting.

## 2018-05-26 NOTE — Progress Notes (Signed)
Inpatient Rehabilitation Admissions Coordinator  I met with patient after he worked with therapy to emphasize the need for him to increase his independence prior to d/c home. 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 I spoke with his daughter by phone to discuss our concerns that if he went directly home, he, his wife and mother in law all need 24/7 supervision initially . I encouraged daughter to speak with her Dad and then I will follow up with him to discuss CIR vs SNF rehab before d/c home.   Danne Baxter, RN, MSN Rehab Admissions Coordinator [phone] 05/26/2018 12:30 PM

## 2018-05-26 NOTE — Progress Notes (Signed)
Inpatient Rehabilitation Admissions Coordinator  I met with patient and his daughter at bedside and patient is now in agreement to and inpt rehab admit . I explained that I will verify with our Rehab MD in the morning if inpt rehab admit can be approved and follow up with him in the morning.  Danne Baxter, RN, MSN Rehab Admissions Coordinator [phone] 05/26/2018 3:21 PM

## 2018-05-26 NOTE — Progress Notes (Signed)
STROKE TEAM PROGRESS NOTE   INTERVAL HISTORY No family is at the bedside.  He  Has met Dr Arlean Hopping PA and scheduled for Lt MCA stent on Jan 20 as outpatient  Vitals:   05/25/18 1945 05/26/18 0353 05/26/18 0758 05/26/18 1245  BP: (!) 156/69 (!) 169/72 130/72 (!) 150/68  Pulse: (!) 59 82 (!) 54 (!) 55  Resp: '18 18 18 18  '$ Temp: 98.2 F (36.8 C) (!) 97.5 F (36.4 C) 97.9 F (36.6 C) 98.2 F (36.8 C)  TempSrc: Oral Oral Oral Oral  SpO2: 94% 98% 99% 99%  Weight:      Height:        CBC:  Recent Labs  Lab 05/24/18 1406  05/25/18 0436 05/26/18 0538  WBC 5.3  --  4.8 5.6  NEUTROABS 2.6  --   --   --   HGB 8.4*   < > 7.9* 7.9*  HCT 28.1*   < > 24.9* 24.7*  MCV 89.2  --  88.3 87.6  PLT 229  --  221 227   < > = values in this interval not displayed.    Basic Metabolic Panel:  Recent Labs  Lab 05/25/18 0436 05/26/18 0538  NA 140 138  K 3.3* 3.6  CL 108 107  CO2 24 23  GLUCOSE 68* 109*  BUN 15 19  CREATININE 1.33* 1.47*  CALCIUM 8.6* 8.4*   Lipid Panel:     Component Value Date/Time   CHOL 140 04/27/2018 0932   TRIG 101 04/27/2018 0932   HDL 39 (L) 04/27/2018 0932   CHOLHDL 3.6 04/27/2018 0932   CHOLHDL 3.5 04/18/2018 0136   VLDL 15 04/18/2018 0136   LDLCALC 81 04/27/2018 0932   HgbA1c:  Lab Results  Component Value Date   HGBA1C 8.2 (H) 04/17/2018   Urine Drug Screen:     Component Value Date/Time   LABOPIA NONE DETECTED 05/24/2018 1447   COCAINSCRNUR NONE DETECTED 05/24/2018 1447   LABBENZ NONE DETECTED 05/24/2018 1447   AMPHETMU NONE DETECTED 05/24/2018 1447   THCU NONE DETECTED 05/24/2018 1447   LABBARB NONE DETECTED 05/24/2018 1447    Alcohol Level     Component Value Date/Time   ETH <10 05/24/2018 1406    IMAGING Ct Angio Head W Or Wo Contrast  Result Date: 05/24/2018 CLINICAL DATA:  Slurred speech and right facial droop EXAM: CT ANGIOGRAPHY HEAD AND NECK TECHNIQUE: Multidetector CT imaging of the head and neck was performed using the  standard protocol during bolus administration of intravenous contrast. Multiplanar CT image reconstructions and MIPs were obtained to evaluate the vascular anatomy. Carotid stenosis measurements (when applicable) are obtained utilizing NASCET criteria, using the distal internal carotid diameter as the denominator. CONTRAST:  120m ISOVUE-370 IOPAMIDOL (ISOVUE-370) INJECTION 76% COMPARISON:  Head CT 05/24/2018 FINDINGS: CTA NECK FINDINGS SKELETON: Lower cervical degenerative disc disease. No bony spinal canal stenosis. OTHER NECK: Normal pharynx, larynx and major salivary glands. No cervical lymphadenopathy. Unremarkable thyroid gland. UPPER CHEST: No pneumothorax or pleural effusion. No nodules or masses. AORTIC ARCH: There is mild calcific atherosclerosis of the aortic arch. There is no aneurysm, dissection or hemodynamically significant stenosis of the visualized ascending aorta and aortic arch. Conventional 3 vessel aortic branching pattern. The visualized proximal subclavian arteries are widely patent. RIGHT CAROTID SYSTEM: --Common carotid artery: Widely patent origin without common carotid artery dissection or aneurysm. --Internal carotid artery: No dissection, occlusion or aneurysm. Mild atherosclerotic calcification at the carotid bifurcation without hemodynamically significant stenosis. --External carotid artery:  No acute abnormality. LEFT CAROTID SYSTEM: --Common carotid artery: Widely patent origin without common carotid artery dissection or aneurysm. --Internal carotid artery: No dissection, occlusion or aneurysm. There is mixed density atherosclerosis extending into the proximal ICA, resulting in 60% stenosis. --External carotid artery: No acute abnormality. VERTEBRAL ARTERIES: Left dominant configuration. Both origins are normal. The right vertebral artery is diminutive along its entire course with attenuated contrast-enhancement. There is complete loss of opacification beyond the proximal V3 segment.  The dominant left vertebral artery is widely patent along its entire course. There is atherosclerotic calcification of the V4 segment without hemodynamically significant stenosis. CTA HEAD FINDINGS POSTERIOR CIRCULATION: --Basilar artery: Normal. --Posterior cerebral arteries: Normal. The right PCA is predominantly supplied by the posterior communicating artery. --Superior cerebellar arteries: Normal. --Inferior cerebellar arteries: Normal AICA bilaterally. The right PICA is supplied from the left PICA. ANTERIOR CIRCULATION: --Intracranial internal carotid arteries: Moderate atherosclerotic calcification. --Anterior cerebral arteries: Normal. Both A1 segments are present. Patent anterior communicating artery. --Middle cerebral arteries: There is severe stenosis of the distal left M1 segment, which is otherwise normal caliber distally. The right middle cerebral artery is normal. --Posterior communicating arteries: Right p-comm gives rise to the right PCA. No left p-comm is visualized. VENOUS SINUSES: As permitted by contrast timing, patent. ANATOMIC VARIANTS: Fetal origin of the right posterior cerebral artery. DELAYED PHASE: Not performed Review of the MIP images confirms the above findings. IMPRESSION: 1. No emergent large vessel occlusion. 2. Severe stenosis of the distal left M1 segment, with preserved distal patency. 3. Attenuated appearance of the right vertebral artery with complete loss of patency at the proximal V3 segment. Right PICA supplied via collaterals from left PICA. 4. 60% stenosis of the proximal left internal carotid artery. These results were communicated to Dr. Karena Addison Aroor at 2:52 pm on 05/24/2018 by text page via the [facility] messaging system. Electronically Signed   By: Ulyses Jarred M.D.   On: 05/24/2018 14:51   Ct Angio Neck W Or Wo Contrast  Result Date: 05/24/2018 CLINICAL DATA:  Slurred speech and right facial droop EXAM: CT ANGIOGRAPHY HEAD AND NECK TECHNIQUE: Multidetector CT imaging  of the head and neck was performed using the standard protocol during bolus administration of intravenous contrast. Multiplanar CT image reconstructions and MIPs were obtained to evaluate the vascular anatomy. Carotid stenosis measurements (when applicable) are obtained utilizing NASCET criteria, using the distal internal carotid diameter as the denominator. CONTRAST:  164m ISOVUE-370 IOPAMIDOL (ISOVUE-370) INJECTION 76% COMPARISON:  Head CT 05/24/2018 FINDINGS: CTA NECK FINDINGS SKELETON: Lower cervical degenerative disc disease. No bony spinal canal stenosis. OTHER NECK: Normal pharynx, larynx and major salivary glands. No cervical lymphadenopathy. Unremarkable thyroid gland. UPPER CHEST: No pneumothorax or pleural effusion. No nodules or masses. AORTIC ARCH: There is mild calcific atherosclerosis of the aortic arch. There is no aneurysm, dissection or hemodynamically significant stenosis of the visualized ascending aorta and aortic arch. Conventional 3 vessel aortic branching pattern. The visualized proximal subclavian arteries are widely patent. RIGHT CAROTID SYSTEM: --Common carotid artery: Widely patent origin without common carotid artery dissection or aneurysm. --Internal carotid artery: No dissection, occlusion or aneurysm. Mild atherosclerotic calcification at the carotid bifurcation without hemodynamically significant stenosis. --External carotid artery: No acute abnormality. LEFT CAROTID SYSTEM: --Common carotid artery: Widely patent origin without common carotid artery dissection or aneurysm. --Internal carotid artery: No dissection, occlusion or aneurysm. There is mixed density atherosclerosis extending into the proximal ICA, resulting in 60% stenosis. --External carotid artery: No acute abnormality. VERTEBRAL ARTERIES: Left dominant  configuration. Both origins are normal. The right vertebral artery is diminutive along its entire course with attenuated contrast-enhancement. There is complete loss of  opacification beyond the proximal V3 segment. The dominant left vertebral artery is widely patent along its entire course. There is atherosclerotic calcification of the V4 segment without hemodynamically significant stenosis. CTA HEAD FINDINGS POSTERIOR CIRCULATION: --Basilar artery: Normal. --Posterior cerebral arteries: Normal. The right PCA is predominantly supplied by the posterior communicating artery. --Superior cerebellar arteries: Normal. --Inferior cerebellar arteries: Normal AICA bilaterally. The right PICA is supplied from the left PICA. ANTERIOR CIRCULATION: --Intracranial internal carotid arteries: Moderate atherosclerotic calcification. --Anterior cerebral arteries: Normal. Both A1 segments are present. Patent anterior communicating artery. --Middle cerebral arteries: There is severe stenosis of the distal left M1 segment, which is otherwise normal caliber distally. The right middle cerebral artery is normal. --Posterior communicating arteries: Right p-comm gives rise to the right PCA. No left p-comm is visualized. VENOUS SINUSES: As permitted by contrast timing, patent. ANATOMIC VARIANTS: Fetal origin of the right posterior cerebral artery. DELAYED PHASE: Not performed Review of the MIP images confirms the above findings. IMPRESSION: 1. No emergent large vessel occlusion. 2. Severe stenosis of the distal left M1 segment, with preserved distal patency. 3. Attenuated appearance of the right vertebral artery with complete loss of patency at the proximal V3 segment. Right PICA supplied via collaterals from left PICA. 4. 60% stenosis of the proximal left internal carotid artery. These results were communicated to Dr. Karena Addison Aroor at 2:52 pm on 05/24/2018 by text page via the [facility] messaging system. Electronically Signed   By: Ulyses Jarred M.D.   On: 05/24/2018 14:51   Mr Brain Wo Contrast  Result Date: 05/24/2018 CLINICAL DATA:  Right facial droop EXAM: MRI HEAD WITHOUT CONTRAST TECHNIQUE: Multiplanar,  multiecho pulse sequences of the brain and surrounding structures were obtained without intravenous contrast. COMPARISON:  CTA head neck 05/24/2018 FINDINGS: BRAIN: There is multifocal cortical and subcortical diffusion restriction within the left MCA territory. Additionally, there is a focus of abnormal diffusion restriction in the right frontal white matter. The midline structures are normal. No midline shift or other mass effect. There are old bilateral lacunar infarcts. There is an old left frontoparietal infarct. Multifocal periventricular white matter hyperintensity, most often a result of chronic microvascular ischemia. Generalized atrophy without lobar predilection. Susceptibility-sensitive sequences show no chronic microhemorrhage or superficial siderosis. VASCULAR: Major intracranial arterial and venous sinus flow voids are normal. SKULL AND UPPER CERVICAL SPINE: Calvarial bone marrow signal is normal. There is no skull base mass. Visualized upper cervical spine and soft tissues are normal. SINUSES/ORBITS: No fluid levels or advanced mucosal thickening. No mastoid or middle ear effusion. The orbits are normal. IMPRESSION: 1. Multifocal acute ischemia within the left MCA territory, predominantly cortical and subcortical and near the watershed zones at the ACA/MCA and MCA/PCA junctions. These could be watershed or embolic infarcts. 2. Additional small focus of ischemia in the right frontal white matter, more suggestive of embolic etiology. 3. Chronic ischemic microangiopathy. 4. No hemorrhage or mass effect. Electronically Signed   By: Ulyses Jarred M.D.   On: 05/24/2018 21:05   Ct Head Code Stroke Wo Contrast  Result Date: 05/24/2018 CLINICAL DATA:  Code stroke. 85 y/o M; slurred speech and right facial droop. EXAM: CT HEAD WITHOUT CONTRAST TECHNIQUE: Contiguous axial images were obtained from the base of the skull through the vertex without intravenous contrast. COMPARISON:  12/13/2017 CT head.  FINDINGS: Brain: No evidence of acute infarction, hemorrhage, hydrocephalus,  extra-axial collection or mass lesion/mass effect. Stable small chronic left frontoparietal infarct and left lentiform nucleus lacunar infarct. Stable nonspecific white matter hypodensities compatible with chronic microvascular ischemic changes. Stable volume loss of the brain. Vascular: Calcific atherosclerosis of carotid siphons. No hyperdense vessel identified. Skull: Normal. Negative for fracture or focal lesion. Sinuses/Orbits: Mild maxillary and ethmoid sinus mucosal thickening. Normal aeration of the mastoid air cells. Other: None. ASPECTS [facility] Stroke Program Early CT Score) - Ganglionic level infarction (caudate, lentiform nuclei, internal capsule, insula, M1-M3 cortex): 7 - Supraganglionic infarction (M4-M6 cortex): 3 Total score (0-10 with 10 being normal): 10 IMPRESSION: 1. No acute intracranial abnormality identified. 2. Stable small chronic left frontoparietal infarct. Stable chronic microvascular ischemic changes and volume loss of the brain. 3. ASPECTS is 10 These results were communicated to Dr. Lorraine Lax at 2:22 pmon 1/7/2020by text page via the [facility] messaging system. Electronically Signed   By: Kristine Garbe M.D.   On: 05/24/2018 14:25   Vas US Carotid  Result Date: 05/25/2018 Carotid Arterial Duplex Study Indications:       Stroke, Carotid stenosis on left. Other Factors:     Coronary Balloon Angioplasty on 04/18/2018. Limitations:       Vessels tortuosity Comparison Study:  Carotid duplex exam on 03/29/2014 Performing Technologist: Rudell Cobb  Examination Guidelines: A complete evaluation includes B-mode imaging, spectral Doppler, color Doppler, and power Doppler as needed of all accessible portions of each vessel. Bilateral testing is considered an integral part of a complete examination. Limited examinations for reoccurring indications may be performed as noted.  Right Carotid Findings:  +----------+--------+--------+--------+------------------------+--------+           PSV cm/sEDV cm/sStenosisDescribe                Comments +----------+--------+--------+--------+------------------------+--------+ CCA Prox  119     22              focal and calcific      tortuous +----------+--------+--------+--------+------------------------+--------+ CCA Distal69      22                                               +----------+--------+--------+--------+------------------------+--------+ ICA Prox  77      32      1-39%   diffuse and heterogenous         +----------+--------+--------+--------+------------------------+--------+ ICA Mid   58      21                                               +----------+--------+--------+--------+------------------------+--------+ ICA Distal78      30                                               +----------+--------+--------+--------+------------------------+--------+ ECA       96      12                                               +----------+--------+--------+--------+------------------------+--------+ +----------+--------+-------+--------+-------------------+  PSV cm/sEDV cmsDescribeArm Pressure (mmHG) +----------+--------+-------+--------+-------------------+ Subclavian119                                        +----------+--------+-------+--------+-------------------+ +---------+--------+--+--------+-+---------+ VertebralPSV cm/s26EDV cm/s5Antegrade +---------+--------+--+--------+-+---------+  Left Carotid Findings: +----------+--------+--------+--------+------------------------+--------+           PSV cm/sEDV cm/sStenosisDescribe                Comments +----------+--------+--------+--------+------------------------+--------+ CCA Prox  55      15              diffuse and heterogenoustortuous +----------+--------+--------+--------+------------------------+--------+ CCA Distal52       10                                               +----------+--------+--------+--------+------------------------+--------+ ICA Prox  78      26      1-39%   diffuse and calcific             +----------+--------+--------+--------+------------------------+--------+ ICA Mid   89      39                                               +----------+--------+--------+--------+------------------------+--------+ ICA Distal80      31                                               +----------+--------+--------+--------+------------------------+--------+ ECA       79      14                                               +----------+--------+--------+--------+------------------------+--------+ +----------+--------+--------+--------+-------------------+ SubclavianPSV cm/sEDV cm/sDescribeArm Pressure (mmHG) +----------+--------+--------+--------+-------------------+           109                                         +----------+--------+--------+--------+-------------------+ +---------+--------+--+--------+--+---------+ VertebralPSV cm/s65EDV cm/s22Antegrade +---------+--------+--+--------+--+---------+  Summary: Right Carotid: Velocities in the right ICA are consistent with a 1-39% stenosis. Left Carotid: Velocities in the left ICA are consistent with a 1-39% stenosis. Vertebrals: Bilateral vertebral arteries demonstrate antegrade flow. *See table(s) above for measurements and observations.  Electronically signed by Antony Contras MD on 05/25/2018 at 12:47:48 PM.    Final     PHYSICAL EXAM  pleasant elderly Caucasian male currently not in distress. He is hard of hearing. . Afebrile. Head is nontraumatic. Neck is supple with soft bilateralt bruits.    Cardiac exam after injection murmur. No  gallop. Lungs are clear to auscultation. Distal pulses are well felt. Neurological Exam :  Coag alert oriented 3 with normal speech and language function. Mild diminished hearing  bilaterally. Pupils irregular equal reactive. Fundi not visualized. Extraocular moments are full range without nystagmus. Blinks to threat bilaterally. Mild right lower facial weakness. Tongue midline. Motor system exam  reveals no upper or lower extremity drift but mild weakness of right grip and intrinsic hand muscles and right wrist extensors and flexors. Mild weakness of elbow flexors and extensors. Symmetric lower extremity stent without weakness. Deep tendon reflexes are symmetric. Plantars are downgoing. Gait not tested. ASSESSMENT/PLAN Mr. [name] is a 85 y.o. male with history of HTN, HLD, CAD and prior stroke w/ L ICA and L M1 severe stenosis w/ residual R HP presenting with slurred speech and R sided weakness.    Stroke:  left MCA  infarct secondary to symptomatic   high grade L MCA M1 stenosis,    Code Stroke CT head No acute stroke. Old L frontoparietal infarct. Small vessel disease. Atrophy. ASPECTS 10.     CTA head & neck no ELVO. Severe distal LM1 stenosis. R VA not seen. R PICA supplied from L PICA collaterals. L ICA 60% proximal stenosis  MRI  L MCA multifocal cortical and subcortical ischemia, possible watershed territory. Small R frontal white matter infarct. Small vessel disease.   Carotid Doppler  B ICA 1-39% stenosis, VAs antegrade   2D Echo  Normal ejection fraction of 50-55% with no wall motion abnormalities.  NO indication for carotid surgery at this time  LDL 81  HgbA1c 8.2  Lovenox 40 mg sq daily for VTE prophylaxis  aspirin 81 mg daily and clopidogrel 75 mg daily prior to admission, now on aspirin 81 mg daily and clopidogrel 75 mg daily. Continue DAPT at d/c  Therapy recommendations:  CIR  Disposition:  pending   Discussed with Dr. Leonie Man who will call Dr. Bronson Curb for consult (see previous stroke info below)  Hx stroke/TIA  08/2016 episode of transient dysarthria and right facial droop while off plavix for cervical injection, MRI and CUS negative.  Likely related to left MCA high grade stenosis. Continued on DAPT. Recent A1C 7.0. BP stable  05/2015, 12/2015 and 07/2016 - followed up with Dr. Estanislado Pandy. repeat MRA showed stable left ICA petrous 50% and supraclinoid 50% stenosis as well as left M1/M2 high grade stenosis.    01/22/15 - L MCA/PCA and MCA/ACA watershed infarcts, and MRA showed left petrous portion ICA high grade stenosis 90%. CTA head and neck showed no thrombosis distal to left petrous ICA stenosis. underwent cerebral angio with angioplasty, no stent required. Repeat MRA post procedure showed stenosis improved to 50%. A1C 7.8 and LDL 61. He was discharged with dual antiplatelet and crestor.   2015- dizziness episode in 2015.   2013 - left MCA stroke   Hypertension  Stable 160-180s . Permissive hypertension (OK if < 220/120) but gradually normalize in 5-7 days . Long-term BP goal normotensive  Hyperlipidemia  Home meds:  No statin  LDL 81, goal < 70  Intolerant to statins  Given relatively normal LDL, will not consider PSCK9 at this time  Diabetes type II  HgbA1c 8.2, goal < 7.0  Uncontrolled  Hypoglycemia in hosptial  Home meds:  amaryl and metformin  DB RN following - recommends d/c amaryl and consider if metformin should be continued  Other Stroke Risk Factors  Advanced age  Former Cigarette smoker, quit 94 yrs ago  Coronary artery disease s/p CABG, stents - STEMI in 03/2016 s/p PCI.   Obstructive sleep apnea, not on CPAP at home  Hx Congestive heart failure  AAA  Other Active Problems  chronic anemia  CKD  UTI  Hospital day # 1     He presented with slurred speech and right hand weakness  secondary to symptomatic severe left MCA stenosis. CT angio neck suggest proximal left carotid stenosis by carotid ultrasound does not confirm this. Recommend continued on dual antiplatelet therapy and aggressive risk factor modification. t Dr. Estanislado Pandy has scheduled him forfor elective left MCA  angioplasty/stenting on 06/06/18.discussed with Dr. Estanislado Pandy. Greater than 50% time during 25 minute visit was spent on counseling and coordination of care about his symptomatic MCA stenosis and discussion about treatment options and answered questions.follow-up as an outpatient in stroke clinic in 6 weeks. Stroke team will sign off. Kindly call for questions if any. Antony Contras, MD Medical Director [facility] St[facility] Pager: [phone] 05/26/2018 1:56 PM  To contact Stroke Continuity provider, please refer to [email or web address] After hours, contact General Neurology

## 2018-05-26 NOTE — PMR Pre-admission (Signed)
PMR Admission Coordinator Pre-Admission Assessment  Patient: [name] is an 85 y.o., male MRN: [id number] DOB: [birth year] Height: 6\' 1"  (185.4 cm) Weight: 98.1 kg              Insurance Information HMO:     PPO:      PCP:      IPA:      80/20:      OTHER: no HMO PRIMARY: Medicare a and b      Policy#: [id number]      Subscriber: pt Benefits:  Phone #: passport one online     Name: 05/26/2018 Eff. Date: 01/16/1998     Deduct: $ 1408      Out of Pocket Max: none      Life Max: none CIR: 100%      SNF: 20 full days Outpatient: 80%     Co-Pay: 20% Home Health: 100%      Co-Pay: none DME: 80%     Co-Pay: 20 % Providers: pt choice  SECONDARY: A[employer] supplement      Policy#: [id number]      Subscriber: pt  Medicaid Application Date:       Case Manager:  Disability Application Date:       Case Worker:   Emergency Contact Information Contact Information    Name Relation Home Work [name] Spouse [phone]     [name],[name] Daughter [phone]  [place] [phone]       Current Medical History  Patient Admitting Diagnosis: Left MCA infarct  History of Present Illness:[name] is an 85 year old right-handed male with history of AAA, CAD with CABG, diastolic congestive heart failure, suprapubic catheter 1 year, diabetes mellitus, CVA 2016 with mild right side weakness maintained on aspirin and Plavix,previous balloon angioplasty of left ICA petrous segment 01/25/2015 per interventional radiology, chronic renal insufficiency.  Presented  05/24/2018 with right-sided weakness. Cranial CT scan reviewed, unremarkable for acute intracranial process. Per report, stable small chronic left frontoparietal infarction. CT angiogram of head and neck with no emergent large vessel occlusion. 60% stenosis of proximal left ICA and severe stenosis of the distal left M1 segment with preserved distal patency. MRI the brain multifocal acute ischemia within the  left MCA territory as well as additional small focus of ischemia in the right frontal white matter. Carotid Dopplers with no ICA stenosis. Echocardiogram with ejection fraction of 55% no wall motion abnormalities grade 1 diastolic dysfunction. Currently maintained on aspirin and Plavix for CVA prophylaxis. Subcutaneous Lovenox for DVT prophylaxis. Tolerating a regular diet. Interventional radiology consult to 05/26/2018 after CT angiogram head and neck reviewed current plans for outpatient cerebral angiogram question need for angioplasty/stenting 06/06/2018 7:30 AM.   Complete NIHSS TOTAL: 2    Past Medical History  Past Medical History:  Diagnosis Date  . Abdominal aortic aneurysm ([place])    a. Aortic duplex 06/2014: mild aneurysmal dilatation of proximal abdominal aorta measuring 3.4x3.4cm. No sig change from 2012. F/u due 06/2016;   . AKI (acute kidney injury) ([place]) 03/28/2016  . Arthritis    "right knee; never bothered me" (03/26/2016)  . 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 CAD in native artery    a. NSTEMI 11/2010 - CABG x2(LIMA to LAD, SVG to PDA). b. NEG Lexi MV 10/24/13, EF 53%, no perfusion abnormality, septal and apical HK noted. c. NSTEMI 05/2014 - s/p DES to SVG-RPDA 06/18/14 (Xience Alpine DES 3.0 x 18  mm -3.35 mm), EF 60-65; d. 03/2016 STEMI/PCI: LM nl, ost LAD 70%, mLCx 50%, pRCA 95% - mRCA 60%, dRPDA 70%, LIMA->LAD ok, o-p SVG->RPDA 100% (3.0x16 Promus DES overlapping prior stent).  . Diastolic dysfunction    a. 03/2016 Echo: EF 60-65%, no rwma, Gr1 DD, triv AI, Ao root 36mm, Asc Ao 78mm, triv MR.  . Dizziness    a. Carotid duplex 03/2014: mild fibrous plaque, no significant stenosis.  . Foley catheter in place    "been wearing it for a couple months now" (03/26/2016)  . HTN (hypertension)   . Hyperlipidemia LDL goal <70   . Medical history non-contributory   . Non-STEMI (non-ST elevated myocardial infarction) ([place]) 11/2010; 03/2018   1. Ostial  LAD 70% (to close to [facility] for PCI), subtotal occlusion of the RCA; 2.  In-stent restenosis SVG-RCA -PTCA.  Marland Kitchen Postoperative atrial fibrillation ([place]) 11/2010   Post CABG, no sign recurrence  . Refusal of blood transfusions as patient is Jehovah's Witness   . 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 Servando Snare)  . Sleep apnea    Not on CPAP. (03/26/2016)  . ST elevation myocardial infarction (STEMI) of inferior wall ([place]) 04/05/2016   Occluded in-stent restenosis/thrombosis of SVG-RCA --> treated with overlapping Promus DES 3.0 mm 16 mm postdilated 3.0 mm).  . Stroke [facility]) 2014; 01/2015   a. 2014 with mild right hand weakness, nonhemorrhagic per pt.;; b. - PTA-Stent L ICA 95%   . Type II diabetes mellitus ([facility])     Family History  family history includes Heart Problems (age of onset: 89) in his father.  Prior Rehab/Hospitalizations:  Has the patient had major surgery during 100 days prior to admission? No  Current Medications   Current Facility-Administered Medications:  .  acetaminophen (TYLENOL) tablet 650 mg, 650 mg, Oral, Q6H PRN, 650 mg at 05/26/18 2153 **OR** acetaminophen (TYLENOL) suppository 650 mg, 650 mg, Rectal, Q6H PRN, Santos-Sanchez, Idalys, MD .  aspirin chewable tablet 81 mg, 81 mg, Oral, Daily, Santos-Sanchez, Idalys, MD, 81 mg at 05/27/18 0829 .  ciprofloxacin (CIPRO) tablet 500 mg, 500 mg, Oral, BID, Santos-Sanchez, Idalys, MD, 500 mg at 05/27/18 0829 .  clopidogrel (PLAVIX) tablet 75 mg, 75 mg, Oral, Daily, Santos-Sanchez, Idalys, MD, 75 mg at 05/27/18 0828 .  enoxaparin (LOVENOX) injection 40 mg, 40 mg, Subcutaneous, Q24H, Santos-Sanchez, Idalys, MD, 40 mg at 05/26/18 2153 .  ferrous sulfate tablet 325 mg, 325 mg, Oral, BID WC, [name] Groves, DO, 325 mg at 05/27/18 4259 .  insulin aspart (novoLOG) injection 0-5 Units, 0-5 Units, Subcutaneous, QHS, Santos-Sanchez, Idalys, MD, 2 Units at 05/26/18 2241 .  insulin aspart (novoLOG) injection 0-9 Units, 0-9 Units,  Subcutaneous, TID WC, Santos-Sanchez, Idalys, MD, 2 Units at 05/25/18 1819 .  rosuvastatin (CRESTOR) tablet 20 mg, 20 mg, Oral, Daily, [name] Sage, MD, 20 mg at 05/27/18 5638  Patients Current Diet:  Diet Order            Diet heart healthy/carb modified Room service appropriate? 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 None of the above  Prior Functional Level Prior Function Level of Independence: Independent Comments: independent, driving, and managing medication; 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   Self Care: Did the patient need help bathing, dressing, using the toilet or eating?  Independent  Indoor Mobility: Did the patient need assistance with walking from room to room (with or without device)? Independent  Stairs: Did the patient need assistance with internal or external stairs (with or without device)? Independent  Functional Cognition: Did the patient need help planning regular tasks such as shopping or remembering to take medications? Independent  Current Functional Level Cognition  Overall Cognitive Status: Impaired/Different from baseline Orientation Level: Oriented X4 Safety/Judgement: Decreased awareness of deficits, Decreased awareness of safety General Comments: Unsure if pt truly with deficit or if he is minimizing deficits due to caregiving responsibilities and  need to return home.  When challenged he will smuggly smile and say "yeah, I know"     Extremity Assessment (includes Sensation/Coordination)  Upper Extremity Assessment: RUE deficits/detail RUE Deficits / Details: Pt demonstrates movement in [place] stage V RUE Sensation: decreased light touch, decreased proprioception RUE Coordination: decreased fine motor, decreased gross motor  Lower Extremity Assessment: Overall WFL for tasks assessed    ADLs  Overall ADL's : Needs assistance/impaired Eating/Feeding: Modified independent Grooming: Wash/dry hands, Wash/dry face, Oral care, Min guard, Standing Upper Body Bathing: Minimal assistance, Sitting Lower Body Bathing: Minimal assistance, Sit to/from stand Upper Body Dressing : Minimal assistance, Sitting Lower Body Dressing: Sit to/from stand, Minimal assistance Toilet Transfer: Minimal assistance, Ambulation Toilet Transfer Details (indicate cue type and reason): simulated in room Toileting- Clothing Manipulation and Hygiene: Moderate assistance, Sit to/from stand Functional mobility during ADLs: Minimal assistance General ADL Comments: patient demonstrates decreased functional use of R UE, affecting independence with daily activities     Mobility  Overal bed mobility: Modified Independent General bed mobility comments: seated OOB in recliner upon entry    Transfers  Overall transfer level: Needs assistance Equipment used: None Transfers: Sit to/from Stand Sit to Stand: Min guard General transfer comment: min guard for safety; pt with instability initially upon standing but did not require any physical assistance    Ambulation / Gait / Stairs / Wheelchair Mobility  Ambulation/Gait Ambulation/Gait assistance: Min assist, Min guard Gait Distance (Feet): 200 Feet(200' x2) Assistive device: None Gait Pattern/deviations: Step-through pattern, Decreased stride length, Drifts right/left General Gait Details: pt with consistent R  sided drift with no awareness, bumping into objects on his R side throughout. Overall min A for stability and safety throughout with occasional brief periods of min guard for safety Gait velocity: decreased    Posture / Balance Dynamic Sitting Balance Sitting balance - Comments: pt able to don socks seated EOB with supervision Balance Overall balance assessment: Needs assistance Sitting-balance support: Feet supported Sitting balance-Leahy Scale: Good Sitting balance - Comments: pt able to don socks seated EOB with supervision Standing balance support: No upper extremity supported, During functional activity Standing balance-Leahy Scale: Poor Standing balance comment: preference to UE support    Special needs/care consideration BiPAP/CPAP n/a CPM n/a Continuous Drip IV n/a Dialysis n/a Life Vest n/a Oxygen n/a Special Bed n/a Trach Size n/a Wound Vac n/a Skin S-P catheter for 1 year Bowel mgmt: continent LBM 1/7 Bladder mgmt: S- P catheter; uses  leg bag Diabetic mgmt Hgb A1c 8.2   Previous Home Environment Living Arrangements: Spouse/significant other(mother in law lives with him and his wife)  Lives With: Spouse(mother in Sports coach) Available Help at Discharge: Family, Available 24 hours/day(daughter and friends are supervising wife and mother in law ) Type of Home: House Home Layout: Two level, Able to live on main level with bedroom/bathroom Alternate Level Stairs-Rails: Right Alternate Level Stairs-Number of Steps: flight Home Access: Ramped entrance Bathroom Shower/Tub: Tub/shower unit, Multimedia programmer: Handicapped height Bathroom Accessibility: Yes How Accessible: Accessible via walker [place]: No  Discharge Living Setting Plans for Discharge Living Setting: Patient's home, Lives with (comment)(wife and mother in law) Type of Home at Discharge: House Discharge Home Layout: Two level, Able to live on main level with bedroom/bathroom Alternate  Level Stairs-Rails: Right Alternate Level Stairs-Number of Steps: flight Discharge Home Access: Ramped entrance Discharge Bathroom Shower/Tub: Tub/shower unit Discharge Bathroom Toilet: Handicapped height Discharge Bathroom Accessibility: Yes How Accessible: Accessible via walker Does the patient have any problems obtaining your medications?: No  Social/Family/Support Systems Patient Roles: Parent, 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; cooks, cleans, e) Sport and exercise psychologist Information: wife [name] and daughter, Jeani Hawking Anticipated Caregiver: Jeani Hawking and very close friends Anticipated Caregiver's Contact Information: see above Ability/Limitations of Caregiver: Jeani Hawking works Monday Wed and Friday Caregiver Availability: Intermittent Discharge Plan Discussed with Primary Caregiver: Yes Is Caregiver In Agreement with Plan?: Yes Does Caregiver/Family have Issues with Lodging/Transportation while Pt is in Rehab?: No  Goals/Additional Needs Patient/Family Goal for Rehab: Mod I with PT and OT Expected length of stay: ELOs 4 to 7 days Special Service Needs: decreased safety awareness; minimizes deficits jokingly Additional Information: Wants to d/c home asap for he is a caregiver Pt/Family Agrees to Admission and willing to participate: Yes Program Orientation Provided & Reviewed with Pt/Caregiver Including Roles  & Responsibilities: Yes  Decrease burden of Care through IP rehab admission: n/a  Possible need for SNF placement upon discharge: not anticipated  Patient Condition: This patient's medical and functional status has changed since the consult dated 05/25/2018 in which the Rehabilitation Physician determined and documented that the patient was potentially appropriate for intensive rehabilitative care in an inpatient rehabilitation facility. Patient is now in agreement to admit for caregiver support has been arranged for pt's wife and mother in law while he is admitted.  Issues have been  addressed and update has been discussed with Dr. Naaman Plummer and patient now appropriate for inpatient rehabilitation. Will admit to inpatient rehab today.   Preadmission Screen Completed By:  Cleatrice Burke, 05/27/2018 8:53 AM ______________________________________________________________________   Discussed status with Dr. Naaman Plummer on 05/27/2018 at [phone] and received telephone approval for admission today.  Admission Coordinator:  Cleatrice Burke, time 7096 Date 05/27/2018

## 2018-05-26 NOTE — Progress Notes (Signed)
Physical Therapy Treatment Patient Details Name: [name] MRN: [id number] DOB: 05/10/1931 Today's Date: 05/26/2018    History of Present Illness Pt is an 85 y/o male admitted secondary to acute onset R sided weakness. MRI revealed multifocal acute ischemia with L MCA territory. PMH including but not limited to vasculopathic hx (including AAA, hx of CABG, MI, CHF, and carotid stenosis), Type 2 Diabetes, hx of stroke, HTN, HLD, CKD, and chronic anemia.    PT Comments    Pt making some progress towards achieving his current functional mobility goals. He continues to demonstrate instability with standing and during ambulation with consistent R lateral drift and frequently bumping into objects, seemingly unaware unless specifically told. Continue to feel pt would greatly benefit from further intensive therapy services at CIR prior to returning home with family support.  Pt would continue to benefit from skilled physical therapy services at this time while admitted and after d/c to address the below listed limitations in order to improve overall safety and independence with functional mobility.    Follow Up Recommendations  CIR     Equipment Recommendations  None recommended by PT    Recommendations for Other Services       Precautions / Restrictions Precautions Precautions: Fall Restrictions Weight Bearing Restrictions: No    Mobility  Bed Mobility               General bed mobility comments: seated OOB in recliner upon entry  Transfers Overall transfer level: Needs assistance Equipment used: None Transfers: Sit to/from Stand Sit to Stand: Min guard         General transfer comment: min guard for safety; pt with instability initially upon standing but did not require any physical assistance  Ambulation/Gait Ambulation/Gait assistance: Min assist;Min guard Gait Distance (Feet): 200 Feet(200' x2) Assistive device: None Gait Pattern/deviations: Step-through  pattern;Decreased stride length;Drifts right/left Gait velocity: decreased   General Gait Details: pt with consistent R sided drift with no awareness, bumping into objects on his R side throughout. Overall min A for stability and safety throughout with occasional brief periods of min guard for safety   Stairs             Wheelchair Mobility    Modified Rankin (Stroke Patients Only) Modified Rankin (Stroke Patients Only) Pre-Morbid Rankin Score: No symptoms Modified Rankin: Moderate disability     Balance Overall balance assessment: Needs assistance Sitting-balance support: Feet supported Sitting balance-Leahy Scale: Good     Standing balance support: No upper extremity supported;During functional activity Standing balance-Leahy Scale: Poor                              Cognition Arousal/Alertness: Awake/alert Behavior During Therapy: WFL for tasks assessed/performed Overall Cognitive Status: Impaired/Different from baseline Area of Impairment: Safety/judgement;Awareness                         Safety/Judgement: Decreased awareness of deficits;Decreased awareness of safety Awareness: Intellectual Problem Solving: Requires verbal cues General Comments: Unsure if pt truly with deficit or if he is minimizing deficits due to caregiving responsibilities and need to return home.  When challenged he will smuggly smile and say "yeah, I know"       Exercises Other Exercises Other Exercises: worked on functional reach with Rt UE with focus on facilitation of neuromuscular re-education.  He was able to reach to ~150* in all planes with min facilitation.  Instructed him in HEP for facilitation of normal movement.      General Comments General comments (skin integrity, edema, etc.): Long discussion with pt re: his current status and care needs and need for post acute rehab. He reluctantly agrees that he is unable to care for wife and mother in law and does  need rehab and assist at discharge.  He reports he has several close family friends who can move in and assist them if it is needed       Pertinent Vitals/Pain Pain Assessment: No/denies pain    Home Living                      Prior Function            PT Goals (current goals can now be found in the care plan section) Acute Rehab PT Goals PT Goal Formulation: With patient Time For Goal Achievement: 06/08/18 Potential to Achieve Goals: Good Progress towards PT goals: Progressing toward goals    Frequency    Min 4X/week      PT Plan Current plan remains appropriate    Co-evaluation              AM-PAC PT "6 Clicks" Mobility   Outcome Measure  Help needed turning from your back to your side while in a flat bed without using bedrails?: None Help needed moving from lying on your back to sitting on the side of a flat bed without using bedrails?: None Help needed moving to and from a bed to a chair (including a wheelchair)?: A Little Help needed standing up from a chair using your arms (e.g., wheelchair or bedside chair)?: A Little Help needed to walk in hospital room?: A Little Help needed climbing 3-5 steps with a railing? : A Lot 6 Click Score: 19    End of Session Equipment Utilized During Treatment: Gait belt Activity Tolerance: Patient tolerated treatment well Patient left: in chair;with call bell/phone within reach;with chair alarm set Nurse Communication: Mobility status PT Visit Diagnosis: Other abnormalities of gait and mobility (R26.89);Other symptoms and signs involving the nervous system (R29.898)     Time: 8250-0370 PT Time Calculation (min) (ACUTE ONLY): 18 min  Charges:  $Gait Training: 8-22 mins                     Sherie Don, [place], DPT  Acute Rehabilitation Services Pager [phone] Office [place] 05/26/2018, 1:16 PM

## 2018-05-26 NOTE — Discharge Summary (Addendum)
Name: [name] MRN: [id number] DOB: [birth year] 85 y.o. PCP: Suzan Garibaldi, FNP  Date of Admission: 05/24/2018  1:57 PM Date of Discharge: 05/27/2018 Attending Physician: Lucious Groves, DO  Discharge Diagnosis: 1. Acute CVA 2. Hypertension 3. CAD status post CABG 4. Type 2 diabetes 5.  Chronic normocytic anemia 6.  Chronic systolic CHF 7.  Chronic kidney disease stage III 8.  Urinary tract infection 9. Vitamin D Deficiency   Discharge Medications: Allergies as of 05/27/2018      Reactions   Other Other (See Comments)   No  BLOOD PRODUCTS - Pt is Jehovah's Witness   Statins Other (See Comments)   Cause muscle aches   Atorvastatin Other (See Comments)   Myalgias   Crestor [rosuvastatin] Other (See Comments)   Myalgias    Pravastatin Other (See Comments)   Myalgias   Simvastatin Other (See Comments)   Myalgias      Medication List    STOP taking these medications   glimepiride 4 MG tablet Commonly known as:  AMARYL   hydrALAZINE 100 MG tablet Commonly known as:  APRESOLINE     TAKE these medications   amLODipine 10 MG tablet Commonly known as:  NORVASC Take 10 mg by mouth every evening.   aspirin 81 MG chewable tablet Chew 1 tablet (81 mg total) by mouth daily.   carvedilol 6.25 MG tablet Commonly known as:  COREG Take 0.5 tablets (3.125 mg total) by mouth every morning AND 1 tablet (6.25 mg total) every evening. What changed:    See the new instructions.  Another medication with the same name was removed. Continue taking this medication, and follow the directions you see here.   ciprofloxacin 500 MG tablet Commonly known as:  CIPRO Take 500 mg by mouth 2 (two) times daily. FOR 10 DAYS   clopidogrel 75 MG tablet Commonly known as:  PLAVIX Take 1 tablet (75 mg total) by mouth daily.   doxazosin 1 MG tablet Commonly known as:  CARDURA Take 1 tablet (1 mg total) by mouth daily.   FERROUS SULFATE IRON 200 (65 Fe) MG Tabs Generic drug:   Ferrous Sulfate Dried Take 1 tablet by mouth 2 (two) times daily after a meal.   furosemide 20 MG tablet Commonly known as:  LASIX Take 1 tablet (20 mg total) by mouth daily.   isosorbide mononitrate 60 MG 24 hr tablet Commonly known as:  IMDUR Take 1 tablet (60 mg total) by mouth 2 (two) times daily.   metFORMIN 1000 MG tablet Commonly known as:  GLUCOPHAGE Take 1 tablet (1,000 mg total) by mouth 2 (two) times daily.   nitroGLYCERIN 0.4 MG SL tablet Commonly known as:  NITROSTAT Place 1 tablet (0.4 mg total) under the tongue every 5 (five) minutes as needed for chest pain (up to 3 doses). What changed:    when to take this  reasons to take this   oxybutynin 5 MG tablet Commonly known as:  DITROPAN Take 5 mg by mouth daily as needed for bladder spasms.   Vitamin D (Ergocalciferol) 1.25 MG (50000 UT) Caps capsule Commonly known as:  DRISDOL Take 1 capsule (50,000 Units total) by mouth every 7 (seven) days for 14 days.       Disposition and follow-up:   [name] was discharged from [facility] in Stable condition.  At the hospital follow up visit please address:  1.  He is scheduled for outpatient cerebral angiogram with likely angioplasty/stenting on  06/06/18 at 7:30 am.  Please ensure that he makes his appointment.  Additionally Mr. [name] will need to continue physical therapy to regain his strength.  Please reassess his neurologic status at hospital follow-up.  2.  Labs / imaging needed at time of follow-up: None  3.  Pending labs/ test needing follow-up: None  Follow-up Appointments: Follow-up Information    Luanne Bras, MD Follow up.   Specialties:  Interventional Radiology, Radiology Why:  You are scheduled for an intervention with Dr. Estanislado Pandy on 06/06/18 at 7:30 am -- please refer to appointment reminder/instruction sheet for pre-procedure details. Please call Anderson Malta or Caryl Pina at [phone] or [phone] with any  questions. Contact information: [place] [id number] [place] Hospital Course by problem list: 1. Acute CVA: [name] presented with acute worsening of right sided weakness, confusion, and was found to have multifocal ischemia in left MCA territory and right frontal area suggestive of both ischemic and embolic etiology.  He was admitted for further stroke work-up.  At the time of admission his residual neurologic deficits included right upper extremity weakness.  CTA head/neck showed severe stenosis of left M1 segment. Additionally he was found to have 60% stenosis of the proximal left internal carotid artery but carotid ultrasound did not confirm this.  TTE showed left ventricular hypertrophy with EF of 55% and grade 1 diastolic dysfunction with no valvular abnormalities.  During his admission he was continued on his home dual antiplatelet agents of aspirin and Plavix.  He will need to continue these medications at discharge.  Additionally he was started on Crestor 20 mg for secondary prevention (did not tolerate atorvastatin in the past).  Vitamin D levels and TSH were tested to determine if these abnormalities could be contributing to his intolerability of statins.  TSH normal.  Vitamin D low (see problem below). OT/PT recommended inpatient rehab and was discharged to CIR to continue intensive PT/OT. He is scheduled for outpatient cerebral angiogram with likely angioplasty/stenting on 06/06/18 at 7:30 am   2. Hypertension: His home blood pressure medications were held to allow for permissive hypertension.  Please add back his home medications to gradually normalize in 5 to 7 days.  3. CAD status post CABG: Remained stable throughout admission.  He was continued on aspirin and Plavix per above.  4. Type 2 diabetes: He presented with an episode of hypoglycemia.  His blood pressure medicines metformin and glimepiride were held during admission.  His blood sugars  improved and started to rise prior to being transferred to CIR.  His metformin was restarted at a lower dose of 500 mg twice daily.  Please continue this medication and titrate as needed.  5.  Chronic normocytic anemia: His home ferrous sulfate was continued at discharge.  His hemoglobin remained stable between 7.9-9.2.  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  Please continue to monitor his anemia.  6.  Chronic systolic CHF: Hx of CHF with EF of 35-40%. Lasix and home coreg for permissive HTN. Does not appear fluid overloaded today but will cont to monitor.  7.  Chronic kidney disease stage III: Cr remained stable throughout admission. Discharge creatinine 1.45.  8.  Urinary tract infection: Started Cipro on 05/23/2017 as an outpatient and completed a 5 day total course while inpatient.  9. Vitamin D Deficiency: Vitamin D low @ 8.8. He was started on 50,000 units of vitamin D once weekly for  a total of 2-week course.  He will then need to be transitioned to 2000 units of vitamin D daily.    Discharge Vitals:   BP 130/72 (BP Location: Right Arm)   Pulse (!) 54   Temp 97.9 F (36.6 C) (Oral)   Resp 18   Ht 6\' 1"  (1.854 m)   Wt 98.1 kg   SpO2 99%   BMI 28.53 kg/m   Pertinent Labs, Studies, and Procedures:  CBC Latest Ref Rng & Units 05/27/2018 05/26/2018 05/25/2018  WBC 4.0 - 10.5 K/uL 5.7 5.6 4.8  Hemoglobin 13.0 - 17.0 g/dL 7.9(L) 7.9(L) 7.9(L)  Hematocrit 39.0 - 52.0 % 24.9(L) 24.7(L) 24.9(L)  Platelets 150 - 400 K/uL 268 227 221   BMP Latest Ref Rng & Units 05/27/2018 05/26/2018 05/25/2018  Glucose 70 - 99 mg/dL 117(H) 109(H) 68(L)  BUN 8 - 23 mg/dL 19 19 15   Creatinine 0.61 - 1.24 mg/dL 1.45(H) 1.47(H) 1.33(H)  BUN/Creat Ratio 10 - 24 - - -  Sodium 135 - 145 mmol/L 138 138 140  Potassium 3.5 - 5.1 mmol/L 3.8 3.6 3.3(L)  Chloride 98 - 111 mmol/L 107 107 108  CO2 22 - 32 mmol/L 25 23 24   Calcium 8.9 - 10.3 mg/dL 8.5(L) 8.4(L) 8.6(L)   1/7 CT Head: 1. No acute  intracranial abnormality identified. 2. Stable small chronic left frontoparietal infarct. Stable chronic microvascular ischemic changes and volume loss of the brain.  1/7 CTA Neck/head IMPRESSION: 1. No emergent large vessel occlusion. 2. Severe stenosis of the distal left M1 segment, with preserved distal patency. 3. Attenuated appearance of the right vertebral artery with complete loss of patency at the proximal V3 segment. Right PICA supplied via collaterals from left PICA. 4. 60% stenosis of the proximal left internal carotid artery.  1/7 MR Brain: IMPRESSION: 1. Multifocal acute ischemia within the left MCA territory, predominantly cortical and subcortical and near the watershed zones at the ACA/MCA and MCA/PCA junctions. These could be watershed or embolic infarcts. 2. Additional small focus of ischemia in the right frontal white matter, more suggestive of embolic etiology. 3. Chronic ischemic microangiopathy. 4. No hemorrhage or mass effect.  Signed: Carroll Sage, MD 05/26/2018, 12:30 PM   Pager: [phone]

## 2018-05-26 NOTE — Progress Notes (Signed)
Subjective: [name] is doing well this morning. He worked with physical therapy and believes that his strength has improved.  He is aware that OT/PT recommended inpatient rehab and he is amenable to this.  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  He does understand how important is to continue rehab to return back to his baseline. Objective:  Vital signs in last 24 hours: Vitals:   05/25/18 1532 05/25/18 1945 05/26/18 0353 05/26/18 0758  BP: 139/76 (!) 156/69 (!) 169/72 130/72  Pulse: (!) 56 (!) 59 82 (!) 54  Resp: 18 18 18 18   Temp: 98 F (36.7 C) 98.2 F (36.8 C) (!) 97.5 F (36.4 C) 97.9 F (36.6 C)  TempSrc: Oral Oral Oral Oral  SpO2: 97% 94% 98% 99%  Weight:      Height:       Physical Exam Vitals signs and nursing note reviewed.  Constitutional:      Comments: Elderly appearing male lying in bed in no acute distress   Cardiovascular:     Rate and Rhythm: Regular rhythm. Bradycardia present.  Pulmonary:     Comments: Auscultated anteriorly, normal heart sounds, nor respiratory distress. Musculoskeletal:     Comments: 4/5 strength in RUE (improved from yesterday), decreased sensation in RUE. Normal sensation in all other extremities and face.  5/5 strength in LUE and LE's bilaterally. Dysmetria of RUE (finger-to-nose). Symmetric face without facial droop. Normal strength of facial muscles.   Psychiatric:        Mood and Affect: Mood normal.        Behavior: Behavior normal.    Assessment/Plan:  Principal Problem:   Acute CVA (cerebrovascular accident) ([place]) Active Problems:   Stenosis of left carotid artery   Essential hypertension   CKD (chronic kidney disease) stage 3, GFR 30-59 ml/min (HCC)   Suprapubic catheter (HCC)   Chronic combined systolic and diastolic CHF (congestive heart failure) (HCC)   Right sided weakness   Right arm weakness   Coronary artery disease involving coronary bypass graft of native heart  without angina pectoris   History of CVA with residual deficit   Acute blood loss anemia  [name] presented with right upper extremity weakness/decreased sensation and altered mental status and found to have a left MCA territory stroke.  He is back to his mental baseline but does continue to have weakness. We will continue to work up secondary causes of his stroke although it is likely to be due to atherosclerotic disease and known M1 stenosis.  CVA:  - Neuro recommends elective stenting and procedure has been scheduled - TTE showed LVH with EF 55% and grade 1 diastolic dysfunction with no valvular abnormalities - Continue ASA 81 mg and Plavix 75 mg QD - Rehab consult recommended dispo home with [facility] - Allow for permissive hypertension up to 220/120 m mercury, normalize slowly in 5-7 days per neuro recs - No signs of new onset afib or other arhythmia on cardiac monitoring. Will continue to monitor. - He is intolerant to statins. Cont Crestor. Monitor for myaglias or other side effects of statins -- Vit D level pending and TSH level normal at 0.609, Thyroid dysregulation not likely cause of statin side effects   HTN: Hx of HTN on several home meds (amlodipine 10 mg, carvedilol 6.25 mg, lasix 20 mg, Imdur 60 mg).  Home medications being held to allow for permissive hypertension.  Blood pressures remain within acceptable limits. Will continue  to hold BP meds and recommend restarting BP meds on 03/29/18. -Continue to monitor  CAD s/p CABG: Stable - Continue aspirin and plavix per above.  Type 2 Diabetes with hypoglycemia: Hypoglycemia resolved and now hyperglycemic at times. Holding home meds for now. We will d/c glimiperide upon discharge given increased risk of hypoglycemia. Recommend starting SGLT2 inhibitor or GLP 1 agonist for added ASCVD risk reduction. Will continue to monitor.  - Sliding scale insulin - Discontinue glimeperide indefinitely - Will restart metformin on  discharge  Chronic Anemia: Anemia stable from most recent admission.  - Continue to monitor and continue home ferrous sulfate  Chronic  Systolic CHF: Hx of CHF with EF of  35-40%. Does not appear fluid overloaded today but will cont to monitor. Will hold his lasix and home coreg for permissive.  CKD: Cr improved from 3 wks ago. Will continue to monitor BMPs daily.   UTI: Not likely the cause of his AMS, will cont abx per below and f/u urine culture. - Continue ciprofloxacin 500 mg BID  Dispo: Will be discharged to CIR in 1-2 days.  Synetta Shadow, Medical Student 05/26/2018, 6:47 AM   Attestation for Student Documentation:  I personally was present and performed or re-performed the history, physical exam and medical decision-making activities of this service and have verified that the service and findings are accurately documented in the student's note.  Carroll Sage, MD 05/26/2018, 3:45 PM

## 2018-05-27 ENCOUNTER — Other Ambulatory Visit: Payer: Self-pay

## 2018-05-27 ENCOUNTER — Encounter (HOSPITAL_COMMUNITY): Payer: Self-pay

## 2018-05-27 ENCOUNTER — Ambulatory Visit (HOSPITAL_COMMUNITY): Admission: RE | Admit: 2018-05-27 | Payer: Medicare Other | Source: Ambulatory Visit

## 2018-05-27 ENCOUNTER — Ambulatory Visit (HOSPITAL_COMMUNITY): Payer: Medicare Other

## 2018-05-27 ENCOUNTER — Inpatient Hospital Stay (HOSPITAL_COMMUNITY)
Admission: RE | Admit: 2018-05-27 | Discharge: 2018-06-01 | DRG: 057 | Disposition: A | Discharge status: Home Health | Payer: Medicare Other | Source: Intra-hospital | Attending: Physical Medicine & Rehabilitation | Admitting: Physical Medicine & Rehabilitation

## 2018-05-27 DIAGNOSIS — I169 Hypertensive crisis, unspecified: Secondary | ICD-10-CM

## 2018-05-27 DIAGNOSIS — I69354 Hemiplegia and hemiparesis following cerebral infarction affecting left non-dominant side: Secondary | ICD-10-CM | POA: Diagnosis not present

## 2018-05-27 DIAGNOSIS — I5032 Chronic diastolic (congestive) heart failure: Secondary | ICD-10-CM | POA: Diagnosis present

## 2018-05-27 DIAGNOSIS — I130 Hypertensive heart and chronic kidney disease with heart failure and stage 1 through stage 4 chronic kidney disease, or unspecified chronic kidney disease: Secondary | ICD-10-CM | POA: Diagnosis present

## 2018-05-27 DIAGNOSIS — I69351 Hemiplegia and hemiparesis following cerebral infarction affecting right dominant side: Secondary | ICD-10-CM

## 2018-05-27 DIAGNOSIS — N183 Chronic kidney disease, stage 3 unspecified: Secondary | ICD-10-CM

## 2018-05-27 DIAGNOSIS — E1165 Type 2 diabetes mellitus with hyperglycemia: Secondary | ICD-10-CM

## 2018-05-27 DIAGNOSIS — E1122 Type 2 diabetes mellitus with diabetic chronic kidney disease: Secondary | ICD-10-CM | POA: Diagnosis present

## 2018-05-27 DIAGNOSIS — Z79899 Other long term (current) drug therapy: Secondary | ICD-10-CM

## 2018-05-27 DIAGNOSIS — Z888 Allergy status to other drugs, medicaments and biological substances status: Secondary | ICD-10-CM

## 2018-05-27 DIAGNOSIS — Z7984 Long term (current) use of oral hypoglycemic drugs: Secondary | ICD-10-CM

## 2018-05-27 DIAGNOSIS — Z951 Presence of aortocoronary bypass graft: Secondary | ICD-10-CM | POA: Diagnosis not present

## 2018-05-27 DIAGNOSIS — E559 Vitamin D deficiency, unspecified: Secondary | ICD-10-CM

## 2018-05-27 DIAGNOSIS — D638 Anemia in other chronic diseases classified elsewhere: Secondary | ICD-10-CM

## 2018-05-27 DIAGNOSIS — Z87891 Personal history of nicotine dependence: Secondary | ICD-10-CM | POA: Diagnosis not present

## 2018-05-27 DIAGNOSIS — I771 Stricture of artery: Secondary | ICD-10-CM

## 2018-05-27 DIAGNOSIS — R531 Weakness: Secondary | ICD-10-CM | POA: Diagnosis present

## 2018-05-27 DIAGNOSIS — I10 Essential (primary) hypertension: Secondary | ICD-10-CM | POA: Diagnosis not present

## 2018-05-27 DIAGNOSIS — E785 Hyperlipidemia, unspecified: Secondary | ICD-10-CM | POA: Diagnosis present

## 2018-05-27 DIAGNOSIS — I129 Hypertensive chronic kidney disease with stage 1 through stage 4 chronic kidney disease, or unspecified chronic kidney disease: Secondary | ICD-10-CM | POA: Diagnosis present

## 2018-05-27 DIAGNOSIS — E1142 Type 2 diabetes mellitus with diabetic polyneuropathy: Secondary | ICD-10-CM | POA: Diagnosis present

## 2018-05-27 DIAGNOSIS — I252 Old myocardial infarction: Secondary | ICD-10-CM

## 2018-05-27 DIAGNOSIS — N189 Chronic kidney disease, unspecified: Secondary | ICD-10-CM | POA: Diagnosis present

## 2018-05-27 DIAGNOSIS — I63512 Cerebral infarction due to unspecified occlusion or stenosis of left middle cerebral artery: Secondary | ICD-10-CM

## 2018-05-27 DIAGNOSIS — Z91048 Other nonmedicinal substance allergy status: Secondary | ICD-10-CM

## 2018-05-27 DIAGNOSIS — D631 Anemia in chronic kidney disease: Secondary | ICD-10-CM

## 2018-05-27 DIAGNOSIS — I2510 Atherosclerotic heart disease of native coronary artery without angina pectoris: Secondary | ICD-10-CM | POA: Diagnosis present

## 2018-05-27 HISTORY — DX: Cerebral infarction due to unspecified occlusion or stenosis of left middle cerebral artery: I63.512

## 2018-05-27 HISTORY — DX: Vitamin D deficiency, unspecified: E55.9

## 2018-05-27 LAB — BASIC METABOLIC PANEL
Anion gap: 6 (ref 5–15)
BUN: 19 mg/dL (ref 8–23)
CALCIUM: 8.5 mg/dL — AB (ref 8.9–10.3)
CO2: 25 mmol/L (ref 22–32)
CREATININE: 1.45 mg/dL — AB (ref 0.61–1.24)
Chloride: 107 mmol/L (ref 98–111)
GFR calc Af Amer: 51 mL/min — ABNORMAL LOW (ref >60)
GFR calc non Af Amer: 44 mL/min — ABNORMAL LOW (ref >60)
Glucose, Bld: 117 mg/dL — ABNORMAL HIGH (ref 70–99)
Potassium: 3.8 mmol/L (ref 3.5–5.1)
SODIUM: 138 mmol/L (ref 135–145)

## 2018-05-27 LAB — CBC WITH DIFFERENTIAL/PLATELET
Abs Immature Granulocytes: 0.09 10*3/uL — ABNORMAL HIGH (ref 0.00–0.07)
Basophils Absolute: 0 10*3/uL (ref 0.0–0.1)
Basophils Relative: 0 %
EOS ABS: 0.1 10*3/uL (ref 0.0–0.5)
Eosinophils Relative: 2 %
HCT: 24.9 % — ABNORMAL LOW (ref 39.0–52.0)
Hemoglobin: 7.9 g/dL — ABNORMAL LOW (ref 13.0–17.0)
Immature Granulocytes: 2 %
Lymphocytes Relative: 27 %
Lymphs Abs: 1.5 10*3/uL (ref 0.7–4.0)
MCH: 28.2 pg (ref 26.0–34.0)
MCHC: 31.7 g/dL (ref 30.0–36.0)
MCV: 88.9 fL (ref 80.0–100.0)
Monocytes Absolute: 1.7 10*3/uL — ABNORMAL HIGH (ref 0.1–1.0)
Monocytes Relative: 30 %
Neutro Abs: 2.2 10*3/uL (ref 1.7–7.7)
Neutrophils Relative %: 39 %
Platelets: 268 10*3/uL (ref 150–400)
RBC: 2.8 MIL/uL — ABNORMAL LOW (ref 4.22–5.81)
RDW: 18.3 % — ABNORMAL HIGH (ref 11.5–15.5)
WBC: 5.7 10*3/uL (ref 4.0–10.5)
nRBC: 0 % (ref 0.0–0.2)

## 2018-05-27 LAB — GLUCOSE, CAPILLARY
Glucose-Capillary: 112 mg/dL — ABNORMAL HIGH (ref 70–99)
Glucose-Capillary: 189 mg/dL — ABNORMAL HIGH (ref 70–99)
Glucose-Capillary: 205 mg/dL — ABNORMAL HIGH (ref 70–99)
Glucose-Capillary: 117 mg/dL — ABNORMAL HIGH (ref 70–99)

## 2018-05-27 LAB — PLATELET INHIBITION P2Y12: PLATELET FUNCTION P2Y12: 194 [PRU] (ref 194–418)

## 2018-05-27 LAB — VITAMIN D 25 HYDROXY (VIT D DEFICIENCY, FRACTURES): Vit D, 25-Hydroxy: 8.8 ng/mL — ABNORMAL LOW (ref 30.0–100.0)

## 2018-05-27 MED ORDER — ENOXAPARIN SODIUM 40 MG/0.4ML ~~LOC~~ SOLN: 40.0000 mg | SUBCUTANEOUS | Status: DC

## 2018-05-27 MED ORDER — ACETAMINOPHEN 325 MG PO TABS: 650.0000 mg | ORAL_TABLET | Freq: Four times a day (QID) | ORAL | Status: DC | PRN

## 2018-05-27 MED ORDER — ROSUVASTATIN CALCIUM 20 MG PO TABS
20.0000 mg | ORAL_TABLET | Freq: Every day | ORAL | Status: DC
  Administered 2018-05-28 – 2018-06-01 (×5): 20 mg via ORAL
  Filled 2018-05-27 (×5): qty 1

## 2018-05-27 MED ORDER — ASPIRIN 81 MG PO CHEW
81.0000 mg | CHEWABLE_TABLET | Freq: Every day | ORAL | Status: DC
  Administered 2018-05-28 – 2018-06-01 (×5): 81 mg via ORAL
  Filled 2018-05-27 (×5): qty 1

## 2018-05-27 MED ORDER — INSULIN ASPART 100 UNIT/ML ~~LOC~~ SOLN
0.0000 [IU] | Freq: Three times a day (TID) | SUBCUTANEOUS | Status: DC
  Administered 2018-05-27: 3 [IU] via SUBCUTANEOUS
  Administered 2018-05-27: 2 [IU] via SUBCUTANEOUS
  Administered 2018-05-28: 1 [IU] via SUBCUTANEOUS
  Administered 2018-05-29: 2 [IU] via SUBCUTANEOUS
  Administered 2018-05-30: 1 [IU] via SUBCUTANEOUS
  Administered 2018-05-30 – 2018-05-31 (×2): 2 [IU] via SUBCUTANEOUS
  Administered 2018-05-31: 1 [IU] via SUBCUTANEOUS
  Administered 2018-06-01: 2 [IU] via SUBCUTANEOUS

## 2018-05-27 MED ORDER — VITAMIN D (ERGOCALCIFEROL) 1.25 MG (50000 UNIT) PO CAPS
50000.0000 [IU] | ORAL_CAPSULE | ORAL | Status: DC
  Administered 2018-05-27: 50000 [IU] via ORAL
  Filled 2018-05-27: qty 1

## 2018-05-27 MED ORDER — ACETAMINOPHEN 650 MG RE SUPP: 650.0000 mg | Freq: Four times a day (QID) | RECTAL | Status: DC | PRN

## 2018-05-27 MED ORDER — FERROUS SULFATE 325 (65 FE) MG PO TABS
325.0000 mg | ORAL_TABLET | Freq: Two times a day (BID) | ORAL | Status: DC
  Administered 2018-05-27 – 2018-06-01 (×10): 325 mg via ORAL
  Filled 2018-05-27 (×10): qty 1

## 2018-05-27 MED ORDER — CLOPIDOGREL BISULFATE 75 MG PO TABS
75.0000 mg | ORAL_TABLET | Freq: Every day | ORAL | Status: DC
  Administered 2018-05-28 – 2018-06-01 (×5): 75 mg via ORAL
  Filled 2018-05-27 (×5): qty 1

## 2018-05-27 MED ORDER — CIPROFLOXACIN HCL 500 MG PO TABS
500.0000 mg | ORAL_TABLET | Freq: Two times a day (BID) | ORAL | Status: AC
  Administered 2018-05-27: 500 mg via ORAL
  Filled 2018-05-27: qty 1

## 2018-05-27 MED ORDER — METFORMIN HCL 500 MG PO TABS
500.0000 mg | ORAL_TABLET | Freq: Two times a day (BID) | ORAL | Status: DC
  Administered 2018-05-27 – 2018-06-01 (×10): 500 mg via ORAL
  Filled 2018-05-27 (×10): qty 1

## 2018-05-27 MED ORDER — SORBITOL 70 % SOLN
30.0000 mL | Freq: Every day | Status: DC | PRN
  Administered 2018-05-28: 30 mL via ORAL
  Filled 2018-05-27: qty 30

## 2018-05-27 MED ORDER — ENOXAPARIN SODIUM 40 MG/0.4ML ~~LOC~~ SOLN
40.0000 mg | SUBCUTANEOUS | Status: DC
  Administered 2018-05-27 – 2018-05-30 (×4): 40 mg via SUBCUTANEOUS
  Filled 2018-05-27 (×4): qty 0.4

## 2018-05-27 MED ORDER — VITAMIN D (ERGOCALCIFEROL) 1.25 MG (50000 UNIT) PO CAPS: 50000.0000 [IU] | ORAL_CAPSULE | ORAL | 0 refills | Status: AC

## 2018-05-27 NOTE — Progress Notes (Signed)
Physical Medicine and Rehabilitation Consult Reason for Consult: Right side weakness Referring Physician: [facility]   HPI: [name] is a 85 y.o. right-handed male with history of AAA, CAD with CABG, diastolic congestive heart failure, suprapubic catheter 1 year, diabetes mellitus, CVA 2014 with mild right side weakness maintained on aspirin, chronic renal insufficiency. Per chart review and patient, patient lives with spouse. 2 level home with ramped entrance. 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 He has a daughter approximately 40 miles away and works every other day. Presented on 12/05/2018 with right side weakness. Cranial CT scan reviewed, unremarkable for acute intracranial process. Per report, stable small chronic left frontoparietal infarction. CT angiogram of head and neck with no emergent large vessel occlusion. 60% stenosis of proximal left ICA. MRI of the brain multifocal acute ischemia within the left MCA territory as well as additional small focus of ischemia in the right frontal white matter. Carotid Dopplers with no ICA stenosis. Echocardiogram pending. Therapy evaluation completed with recommendations of physical medicine rehabilitation consult.  Review of Systems  Constitutional: Negative for fever.  HENT: Negative for hearing loss.   Eyes: Negative for blurred vision and double vision.  Cardiovascular: Negative for chest pain and palpitations.  Gastrointestinal: Positive for constipation. Negative for nausea and vomiting.  Genitourinary: Negative for dysuria and hematuria.  Musculoskeletal: Positive for joint pain and myalgias.  Skin: Negative for rash.  Neurological: Positive for dizziness, sensory change and focal weakness.  All other systems reviewed and are negative.      Past Medical History:  Diagnosis Date  . Abdominal aortic aneurysm ([place])    a. Aortic duplex 06/2014: mild aneurysmal dilatation of  proximal abdominal aorta measuring 3.4x3.4cm. No sig change from 2012. F/u due 06/2016;   . AKI (acute kidney injury) ([place]) 03/28/2016  . Arthritis    "right knee; never bothered me" (03/26/2016)  . 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 CAD in native artery    a. NSTEMI 11/2010 - CABG x2(LIMA to LAD, SVG to PDA). b. NEG Lexi MV 10/24/13, EF 53%, no perfusion abnormality, septal and apical HK noted. c. NSTEMI 05/2014 - s/p DES to SVG-RPDA 06/18/14 (Xience Alpine DES 3.0 x 18 mm -3.35 mm), EF 60-65; d. 03/2016 STEMI/PCI: LM nl, ost LAD 70%, mLCx 50%, pRCA 95% - mRCA 60%, dRPDA 70%, LIMA->LAD ok, o-p SVG->RPDA 100% (3.0x16 Promus DES overlapping prior stent).  . Diastolic dysfunction    a. 03/2016 Echo: EF 60-65%, no rwma, Gr1 DD, triv AI, Ao root 72mm, Asc Ao 62mm, triv MR.  . Dizziness    a. Carotid duplex 03/2014: mild fibrous plaque, no significant stenosis.  . Foley catheter in place    "been wearing it for a couple months now" (03/26/2016)  . HTN (hypertension)   . Hyperlipidemia LDL goal <70   . Non-STEMI (non-ST elevated myocardial infarction) ([place]) 11/2010; 03/2018   1. Ostial LAD 70% (to close to [facility] for PCI), subtotal occlusion of the RCA; 2.  In-stent restenosis SVG-RCA -PTCA.  Marland Kitchen Postoperative atrial fibrillation ([place]) 11/2010   Post CABG, no sign recurrence  . Refusal of blood transfusions as patient is Jehovah's Witness   . 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 Servando Snare)  . Sleep apnea    Not on CPAP. (03/26/2016)  . ST elevation myocardial infarction (STEMI) of inferior wall ([place]) 04/05/2016   Occluded in-stent restenosis/thrombosis of  SVG-RCA --> treated with overlapping Promus DES 3.0 mm 16 mm postdilated 3.0 mm).  . Stroke [facility]) 2014; 01/2015   a. 2014 with mild right hand weakness, nonhemorrhagic per pt.;; b. - PTA-Stent L ICA 95%   . Type II diabetes mellitus ([place])         Past Surgical History:    Procedure Laterality Date  . APPENDECTOMY    . CARDIAC CATHETERIZATION  12/11/2010   Dr. Chase Picket - subsequent cath - normal LV systolic function, no renal artery stenosis, severe 2-vessel disease with subtotaled RCA prox and distal 60% lesion and complex 70% area of narrowing of ostium of LAD  . CARDIAC CATHETERIZATION N/A 03/26/2016   Procedure: Left Heart Cath and Coronary Angiography;  Surgeon: Nelva Bush, MD;  Location: [place] [facility];  Service: Cardiovascular;  Laterality: N/A;  . CARDIAC CATHETERIZATION N/A 03/26/2016   Procedure: Coronary Stent Intervention;  Surgeon: Nelva Bush, MD;  Location: [place] [facility];  Service: Cardiovascular: 100% In-stent thrombosis of pros SVG-RCA (Xience DES) --> treated with PromusDES 3.0 x 18 (3.3 mm)  . CARDIAC CATHETERIZATION N/A 03/26/2016   Procedure: Bypass Graft Angiography;  Surgeon: Nelva Bush, MD;  Location: [place] [facility];  Service: Cardiovascular;  Laterality: N/A;  . CORONARY ANGIOPLASTY WITH STENT PLACEMENT  06/18/2014   PCI to SVG-RPDA 06/18/14 (Xience Alpine DES 3.0 x 18 mm -3.35 mm),   . CORONARY ARTERY BYPASS GRAFT  12/15/2010   X2, Dr Servando Snare; LIMA to LAD, SVG to PDA;   . CORONARY BALLOON ANGIOPLASTY N/A 04/18/2018   Procedure: CORONARY BALLOON ANGIOPLASTY;  Surgeon: Leonie Man, MD;  Location: [facility];  Service: Cardiovascular;;; high pressure scoring and noncompliant balloon PTCA of SVG-RCA ISR ostial and proximal  . CORONARY/GRAFT ANGIOGRAPHY N/A 08/27/2017   Procedure: CORONARY/GRAFT ANGIOGRAPHY;  Surgeon: Nelva Bush, MD;  Location: [place] [facility];  Service: Cardiovascular;; pLAD 70%, ostD1 50%.  mCx 60%, OM2 80%. pRCA 95% & mRCA 100% - rPDA 70%. LIMA-mLAD patent. SVG-rPDA 10% ISR.   Marland Kitchen CYSTOSCOPY WITH URETHRAL DILATATION    . IR ANGIO INTRA EXTRACRAN SEL COM CAROTID INNOMINATE BILAT MOD SED  02/25/2017  . IR ANGIO INTRA EXTRACRAN SEL COM CAROTID INNOMINATE BILAT MOD SED   10/19/2017   Dr. Kathee Delton: L Common Carotid - ECA & major branches widely patent. ICA ~20% distal to bulb & 50% in supraclinoid segment. LMCA-distal 1/3 MI ~905 stenosis with post-stenotic dilation into inferior division. ~50% prox Basilar A stenosis @ anterior Inf Cerebellar A. 50% R ICA  . IR ANGIO VERTEBRAL SEL SUBCLAVIAN INNOMINATE UNI L MOD SED  02/25/2017  . IR ANGIO VERTEBRAL SEL SUBCLAVIAN INNOMINATE UNI R MOD SED  10/19/2017  . IR ANGIO VERTEBRAL SEL VERTEBRAL UNI L MOD SED  10/19/2017  . IR GENERIC HISTORICAL  01/21/2016   IR RADIOLOGIST EVAL & MGMT 01/21/2016 [facility]INTER[facility]  . IR GENERIC HISTORICAL  02/03/2016   IR CATHETER TUBE CHANGE 02/03/2016 Marybelle Killings, MD [facility]INTER[facility]  . IR RADIOLOGIST EVAL & MGMT  11/09/2017  . LEFT HEART CATH AND CORONARY ANGIOGRAPHY N/A 04/18/2018   Procedure: LEFT HEART CATH AND CORONARY ANGIOGRAPHY;  Surgeon: Leonie Man, MD;  Location: [place] [facility];  Service: Cardiovascular;  Laterality: N/A; stable findings on last cath with exception of 75% in-stent restenosis of SVG-RCA ostial stent treated with PTCA.    Marland Kitchen LEFT HEART CATHETERIZATION WITH CORONARY ANGIOGRAM N/A 06/18/2014   Procedure: LEFT HEART CATHETERIZATION WITH CORONARY ANGIOGRAM;  Surgeon: Leonie Man, MD;  Location: [facility] CATH LAB;  Service: Cardiovascular;  -- severe disease of SVG-rPDA  . RADIOLOGY WITH ANESTHESIA N/A 01/24/2015   Procedure: STENT ASSISTED ANGIOPLASTY (RADIOLOGY WITH ANESTHESIA);  Surgeon: Luanne Bras, MD;  Location: [facility];  Service: Radiology;  Laterality: N/A;  . TONSILLECTOMY    . TRANSTHORACIC ECHOCARDIOGRAM  08/2017; 04/2018:    A) EF 60-65%. Mild LVH. No RWMA. Gr 1 DD. Mod LA dilation.;  B)  EF 35-40%.  Severe LVH.  GRII DD.  Apical anteroseptal hypokinesis.  Akinesis of the apex.        Family History  Problem Relation Age of Onset  . Heart Problems Father 66   Social History:  reports that he quit smoking about 59 years ago. His smoking use  included cigarettes. He has a 3.30 pack-year smoking history. He has never used smokeless tobacco. He reports that he does not drink alcohol or use drugs. Allergies:       Allergies  Allergen Reactions  . Other Other (See Comments)    No  BLOOD PRODUCTS - Pt is Jehovah's Witness  . Statins Other (See Comments)    Cause muscle aches  . Atorvastatin Other (See Comments)    Myalgias  . Crestor [Rosuvastatin] Other (See Comments)    Myalgias   . Pravastatin Other (See Comments)    Myalgias  . Simvastatin Other (See Comments)    Myalgias          Medications Prior to Admission  Medication Sig Dispense Refill  . amLODipine (NORVASC) 10 MG tablet Take 10 mg by mouth every evening.     Marland Kitchen aspirin 81 MG chewable tablet Chew 1 tablet (81 mg total) by mouth daily.    . carvedilol (COREG) 6.25 MG tablet Take 0.5 tablets (3.125 mg total) by mouth every morning AND 1 tablet (6.25 mg total) every evening. (Patient taking differently: Take 3.125 mg by mouth in the morning and 6.25 mg in the evening) 135 tablet 3  . ciprofloxacin (CIPRO) 500 MG tablet Take 500 mg by mouth 2 (two) times daily. FOR 10 DAYS    . clopidogrel (PLAVIX) 75 MG tablet Take 1 tablet (75 mg total) by mouth daily. 90 tablet 3  . doxazosin (CARDURA) 1 MG tablet Take 1 tablet (1 mg total) by mouth daily. 30 tablet 0  . Ferrous Sulfate Dried (FERROUS SULFATE IRON) 200 (65 Fe) MG TABS Take 1 tablet by mouth 2 (two) times daily after a meal.    . furosemide (LASIX) 20 MG tablet Take 1 tablet (20 mg total) by mouth daily. 30 tablet 0  . glimepiride (AMARYL) 4 MG tablet Take 4 mg by mouth 2 (two) times daily with a meal.     . isosorbide mononitrate (IMDUR) 60 MG 24 hr tablet Take 1 tablet (60 mg total) by mouth 2 (two) times daily. 60 tablet 0  . metFORMIN (GLUCOPHAGE) 1000 MG tablet Take 1 tablet (1,000 mg total) by mouth 2 (two) times daily.    . nitroGLYCERIN (NITROSTAT) 0.4 MG SL tablet Place 1 tablet (0.4  mg total) under the tongue every 5 (five) minutes as needed for chest pain (up to 3 doses). (Patient taking differently: Place 0.4 mg under the tongue every 5 (five) minutes x 3 doses as needed for chest pain. ) 25 tablet 3  . oxybutynin (DITROPAN) 5 MG tablet Take 5 mg by mouth daily as needed for bladder spasms.     . carvedilol (COREG) 12.5 MG  tablet Take 6.25 mg by mouth 2 (two) times daily with a meal.    . hydrALAZINE (APRESOLINE) 100 MG tablet Take 1 tablet (100 mg total) by mouth 2 (two) times daily. DISCONTINUE PERVIOUS DOSE  PATIENT WILL CALL WHEN REFILL IS NEEDED (Patient not taking: Reported on 05/24/2018) 180 tablet 3    Home: Home Living Family/patient expects to be discharged to:: Private residence Living Arrangements: Spouse/significant other, Parent Available Help at Discharge: Family Type of Home: House Home Access: Ramped entrance Home Layout: Two level Alternate Level Stairs-Number of Steps: flight Alternate Level Stairs-Rails: Right Bathroom Shower/Tub: Tub/shower unit, Multimedia programmer: Handicapped height Home Equipment: Civil engineer, contracting, Radio producer - single point, Crutches, Environmental consultant - 2 wheels, Wheelchair - manual  Functional History: Prior Function Level of Independence: Independent Comments: independent, driving, and managing medication; 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  Functional Status:  Mobility: Bed Mobility Overal bed mobility: Modified Independent General bed mobility comments: seated OOB in recliner upon entry Transfers Overall transfer level: Needs assistance Equipment used: None Transfers: Sit to/from Stand Sit to Stand: Min assist General transfer comment: min A for stability with transition as pt slightly unsteady initially Ambulation/Gait Ambulation/Gait assistance: Min assist Gait Distance (Feet): 100 Feet Assistive device: 1 person hand held assist Gait Pattern/deviations: Step-through pattern, Decreased stride length, Drifts  right/left General Gait Details: pt with consistent R sided drift with no awareness, bumping into objects on his R side throughout. Overall min A for stability and safety throughout Gait velocity: decreased  ADL: ADL Overall ADL's : Needs assistance/impaired Grooming: Minimal assistance, Standing, Wash/dry hands Upper Body Bathing: Minimal assistance, Sitting Lower Body Bathing: Minimal assistance, Sit to/from stand Upper Body Dressing : Minimal assistance, Sitting Lower Body Dressing: Sit to/from stand, Minimal assistance Toilet Transfer: Minimal assistance, Ambulation Toilet Transfer Details (indicate cue type and reason): simulated in room Toileting- Clothing Manipulation and Hygiene: Moderate assistance, Sit to/from stand Functional mobility during ADLs: Minimal assistance General ADL Comments: patient demonstrates decreased functional use of R UE, affecting independence with daily activities   Cognition: Cognition Overall Cognitive Status: Impaired/Different from baseline Orientation Level: Oriented X4 Cognition Arousal/Alertness: Awake/alert Behavior During Therapy: WFL for tasks assessed/performed Overall Cognitive Status: Impaired/Different from baseline Area of Impairment: Memory, Safety/judgement, Problem solving Memory: Decreased short-term memory Safety/Judgement: Decreased awareness of deficits Problem Solving: Difficulty sequencing, Requires verbal cues  Blood pressure (!) 176/78, pulse 65, temperature 98.6 F (37 C), resp. rate 18, height 6\' 1"  (1.854 m), weight 98.1 kg, SpO2 98 %. Physical Exam  Vitals reviewed. Constitutional: He is oriented to person, place, and time. He appears well-developed and well-nourished.  HENT:  Head: Normocephalic and atraumatic.  Eyes: EOM are normal. Right eye exhibits no discharge. Left eye exhibits no discharge.  Neck: Normal range of motion. Neck supple. No thyromegaly present.  Cardiovascular: Normal rate and regular  rhythm.  Respiratory: Effort normal and breath sounds normal. No respiratory distress.  GI: Soft. Bowel sounds are normal. He exhibits no distension.  Genitourinary:    Genitourinary Comments: Suprapubic tube in place   Musculoskeletal:     Comments: No edema or tenderness in extremities  Neurological: He is alert and oriented to person, place, and time.  Motor: RUE: 4+/5 proximal to distal LUE: 4-/5 proximal to distal with apraxia RLE: HF 4-/5, KE 4/5, ADF 5/5 LLE: HF 3/5, KE 3/5, ADF 4+/5 Sensation diminished to light touch RUE  Skin: Skin is warm and dry.  Psychiatric: He has a normal mood and affect. His  behavior is normal.   LabResultsLast24Hours        Results for orders placed or performed during the hospital encounter of 05/24/18 (from the past 24 hour(s))  CBG monitoring, ED     Status: Abnormal   Collection Time: 05/24/18  1:58 PM  Result Value Ref Range   Glucose-Capillary 59 (L) 70 - 99 mg/dL  Ethanol     Status: None   Collection Time: 05/24/18  2:06 PM  Result Value Ref Range   Alcohol, Ethyl (B) <10 <10 mg/dL  Protime-INR     Status: None   Collection Time: 05/24/18  2:06 PM  Result Value Ref Range   Prothrombin Time 14.3 11.4 - 15.2 seconds   INR 1.11   APTT     Status: None   Collection Time: 05/24/18  2:06 PM  Result Value Ref Range   aPTT 32 24 - 36 seconds  CBC     Status: Abnormal   Collection Time: 05/24/18  2:06 PM  Result Value Ref Range   WBC 5.3 4.0 - 10.5 K/uL   RBC 3.15 (L) 4.22 - 5.81 MIL/uL   Hemoglobin 8.4 (L) 13.0 - 17.0 g/dL   HCT 28.1 (L) 39.0 - 52.0 %   MCV 89.2 80.0 - 100.0 fL   MCH 26.7 26.0 - 34.0 pg   MCHC 29.9 (L) 30.0 - 36.0 g/dL   RDW 18.5 (H) 11.5 - 15.5 %   Platelets 229 150 - 400 K/uL   nRBC 0.0 0.0 - 0.2 %  Differential     Status: Abnormal   Collection Time: 05/24/18  2:06 PM  Result Value Ref Range   Neutrophils Relative % 48 %   Neutro Abs 2.6 1.7 - 7.7 K/uL   Lymphocytes Relative 28 %     Lymphs Abs 1.5 0.7 - 4.0 K/uL   Monocytes Relative 18 %   Monocytes Absolute 0.9 0.1 - 1.0 K/uL   Eosinophils Relative 2 %   Eosinophils Absolute 0.1 0.0 - 0.5 K/uL   Basophils Relative 1 %   Basophils Absolute 0.0 0.0 - 0.1 K/uL   Immature Granulocytes 3 %   Abs Immature Granulocytes 0.17 (H) 0.00 - 0.07 K/uL  Comprehensive metabolic panel     Status: Abnormal   Collection Time: 05/24/18  2:06 PM  Result Value Ref Range   Sodium 140 135 - 145 mmol/L   Potassium 4.0 3.5 - 5.1 mmol/L   Chloride 109 98 - 111 mmol/L   CO2 23 22 - 32 mmol/L   Glucose, Bld 67 (L) 70 - 99 mg/dL   BUN 17 8 - 23 mg/dL   Creatinine, Ser 1.41 (H) 0.61 - 1.24 mg/dL   Calcium 8.9 8.9 - 10.3 mg/dL   Total Protein 6.6 6.5 - 8.1 g/dL   Albumin 3.4 (L) 3.5 - 5.0 g/dL   AST 15 15 - 41 U/L   ALT 9 0 - 44 U/L   Alkaline Phosphatase 55 38 - 126 U/L   Total Bilirubin 0.5 0.3 - 1.2 mg/dL   GFR calc non Af Amer 45 (L) >60 mL/min   GFR calc Af Amer 52 (L) >60 mL/min   Anion gap 8 5 - 15  I-Stat Chem 8, ED     Status: Abnormal   Collection Time: 05/24/18  2:12 PM  Result Value Ref Range   Sodium 141 135 - 145 mmol/L   Potassium 4.0 3.5 - 5.1 mmol/L   Chloride 107 98 - 111 mmol/L   BUN  19 8 - 23 mg/dL   Creatinine, Ser 1.40 (H) 0.61 - 1.24 mg/dL   Glucose, Bld 64 (L) 70 - 99 mg/dL   Calcium, Ion 1.19 1.15 - 1.40 mmol/L   TCO2 24 22 - 32 mmol/L   Hemoglobin 9.2 (L) 13.0 - 17.0 g/dL   HCT 27.0 (L) 39.0 - 52.0 %  Urine rapid drug screen (hosp performed)     Status: None   Collection Time: 05/24/18  2:47 PM  Result Value Ref Range   Opiates NONE DETECTED NONE DETECTED   Cocaine NONE DETECTED NONE DETECTED   Benzodiazepines NONE DETECTED NONE DETECTED   Amphetamines NONE DETECTED NONE DETECTED   Tetrahydrocannabinol NONE DETECTED NONE DETECTED   Barbiturates NONE DETECTED NONE DETECTED  Urinalysis, Routine w reflex microscopic     Status: Abnormal   Collection  Time: 05/24/18  2:47 PM  Result Value Ref Range   Color, Urine STRAW (A) YELLOW   APPearance CLEAR CLEAR   Specific Gravity, Urine 1.012 1.005 - 1.030   pH 7.0 5.0 - 8.0   Glucose, UA NEGATIVE NEGATIVE mg/dL   Hgb urine dipstick NEGATIVE NEGATIVE   Bilirubin Urine NEGATIVE NEGATIVE   Ketones, ur NEGATIVE NEGATIVE mg/dL   Protein, ur 100 (A) NEGATIVE mg/dL   Nitrite NEGATIVE NEGATIVE   Leukocytes, UA TRACE (A) NEGATIVE   RBC / HPF 0-5 0 - 5 RBC/hpf   WBC, UA 6-10 0 - 5 WBC/hpf   Bacteria, UA RARE (A) NONE SEEN   Mucus PRESENT    Hyaline Casts, UA PRESENT   Urine culture     Status: None   Collection Time: 05/24/18  2:47 PM  Result Value Ref Range   Specimen Description URINE, RANDOM    Special Requests NONE    Culture      NO GROWTH Performed at [facility], [address] [place], [place] [address]    Report Status 05/25/2018 FINAL   CBG monitoring, ED     Status: None   Collection Time: 05/24/18  2:49 PM  Result Value Ref Range   Glucose-Capillary 97 70 - 99 mg/dL  CBG monitoring, ED     Status: Abnormal   Collection Time: 05/24/18  5:08 PM  Result Value Ref Range   Glucose-Capillary 52 (L) 70 - 99 mg/dL  Glucose, capillary     Status: None   Collection Time: 05/24/18  6:28 PM  Result Value Ref Range   Glucose-Capillary 96 70 - 99 mg/dL   Comment 1 Notify RN    Comment 2 Document in Chart   Glucose, capillary     Status: None   Collection Time: 05/24/18  9:28 PM  Result Value Ref Range   Glucose-Capillary 88 70 - 99 mg/dL  Basic metabolic panel     Status: Abnormal   Collection Time: 05/25/18  4:36 AM  Result Value Ref Range   Sodium 140 135 - 145 mmol/L   Potassium 3.3 (L) 3.5 - 5.1 mmol/L   Chloride 108 98 - 111 mmol/L   CO2 24 22 - 32 mmol/L   Glucose, Bld 68 (L) 70 - 99 mg/dL   BUN 15 8 - 23 mg/dL   Creatinine, Ser 1.33 (H) 0.61 - 1.24 mg/dL   Calcium 8.6 (L) 8.9 - 10.3 mg/dL   GFR calc non Af  Amer 48 (L) >60 mL/min   GFR calc Af Amer 56 (L) >60 mL/min   Anion gap 8 5 - 15  CBC  Status: Abnormal   Collection Time: 05/25/18  4:36 AM  Result Value Ref Range   WBC 4.8 4.0 - 10.5 K/uL   RBC 2.82 (L) 4.22 - 5.81 MIL/uL   Hemoglobin 7.9 (L) 13.0 - 17.0 g/dL   HCT 24.9 (L) 39.0 - 52.0 %   MCV 88.3 80.0 - 100.0 fL   MCH 28.0 26.0 - 34.0 pg   MCHC 31.7 30.0 - 36.0 g/dL   RDW 18.4 (H) 11.5 - 15.5 %   Platelets 221 150 - 400 K/uL   nRBC 0.0 0.0 - 0.2 %  Glucose, capillary     Status: None   Collection Time: 05/25/18  7:50 AM  Result Value Ref Range   Glucose-Capillary 77 70 - 99 mg/dL   Comment 1 Procedure Error   Glucose, capillary     Status: Abnormal   Collection Time: 05/25/18 11:48 AM  Result Value Ref Range   Glucose-Capillary 204 (H) 70 - 99 mg/dL   Comment 1 Notify RN    Comment 2 Document in Chart       ImagingResults(Last48hours)  Ct Angio Head W Or Wo Contrast  Result Date: 05/24/2018 CLINICAL DATA:  Slurred speech and right facial droop EXAM: CT ANGIOGRAPHY HEAD AND NECK TECHNIQUE: Multidetector CT imaging of the head and neck was performed using the standard protocol during bolus administration of intravenous contrast. Multiplanar CT image reconstructions and MIPs were obtained to evaluate the vascular anatomy. Carotid stenosis measurements (when applicable) are obtained utilizing NASCET criteria, using the distal internal carotid diameter as the denominator. CONTRAST:  118mL ISOVUE-370 IOPAMIDOL (ISOVUE-370) INJECTION 76% COMPARISON:  Head CT 05/24/2018 FINDINGS: CTA NECK FINDINGS SKELETON: Lower cervical degenerative disc disease. No bony spinal canal stenosis. OTHER NECK: Normal pharynx, larynx and major salivary glands. No cervical lymphadenopathy. Unremarkable thyroid gland. UPPER CHEST: No pneumothorax or pleural effusion. No nodules or masses. AORTIC ARCH: There is mild calcific atherosclerosis of the aortic arch. There is no  aneurysm, dissection or hemodynamically significant stenosis of the visualized ascending aorta and aortic arch. Conventional 3 vessel aortic branching pattern. The visualized proximal subclavian arteries are widely patent. RIGHT CAROTID SYSTEM: --Common carotid artery: Widely patent origin without common carotid artery dissection or aneurysm. --Internal carotid artery: No dissection, occlusion or aneurysm. Mild atherosclerotic calcification at the carotid bifurcation without hemodynamically significant stenosis. --External carotid artery: No acute abnormality. LEFT CAROTID SYSTEM: --Common carotid artery: Widely patent origin without common carotid artery dissection or aneurysm. --Internal carotid artery: No dissection, occlusion or aneurysm. There is mixed density atherosclerosis extending into the proximal ICA, resulting in 60% stenosis. --External carotid artery: No acute abnormality. VERTEBRAL ARTERIES: Left dominant configuration. Both origins are normal. The right vertebral artery is diminutive along its entire course with attenuated contrast-enhancement. There is complete loss of opacification beyond the proximal V3 segment. The dominant left vertebral artery is widely patent along its entire course. There is atherosclerotic calcification of the V4 segment without hemodynamically significant stenosis. CTA HEAD FINDINGS POSTERIOR CIRCULATION: --Basilar artery: Normal. --Posterior cerebral arteries: Normal. The right PCA is predominantly supplied by the posterior communicating artery. --Superior cerebellar arteries: Normal. --Inferior cerebellar arteries: Normal AICA bilaterally. The right PICA is supplied from the left PICA. ANTERIOR CIRCULATION: --Intracranial internal carotid arteries: Moderate atherosclerotic calcification. --Anterior cerebral arteries: Normal. Both A1 segments are present. Patent anterior communicating artery. --Middle cerebral arteries: There is severe stenosis of the distal left M1  segment, which is otherwise normal caliber distally. The right middle cerebral artery is normal. --Posterior communicating arteries:  Right p-comm gives rise to the right PCA. No left p-comm is visualized. VENOUS SINUSES: As permitted by contrast timing, patent. ANATOMIC VARIANTS: Fetal origin of the right posterior cerebral artery. DELAYED PHASE: Not performed Review of the MIP images confirms the above findings. IMPRESSION: 1. No emergent large vessel occlusion. 2. Severe stenosis of the distal left M1 segment, with preserved distal patency. 3. Attenuated appearance of the right vertebral artery with complete loss of patency at the proximal V3 segment. Right PICA supplied via collaterals from left PICA. 4. 60% stenosis of the proximal left internal carotid artery. These results were communicated to Dr. Karena Addison Aroor at 2:52 pm on 05/24/2018 by text page via the [facility] messaging system. Electronically Signed   By: Ulyses Jarred M.D.   On: 05/24/2018 14:51   Ct Angio Neck W Or Wo Contrast  Result Date: 05/24/2018 CLINICAL DATA:  Slurred speech and right facial droop EXAM: CT ANGIOGRAPHY HEAD AND NECK TECHNIQUE: Multidetector CT imaging of the head and neck was performed using the standard protocol during bolus administration of intravenous contrast. Multiplanar CT image reconstructions and MIPs were obtained to evaluate the vascular anatomy. Carotid stenosis measurements (when applicable) are obtained utilizing NASCET criteria, using the distal internal carotid diameter as the denominator. CONTRAST:  165mL ISOVUE-370 IOPAMIDOL (ISOVUE-370) INJECTION 76% COMPARISON:  Head CT 05/24/2018 FINDINGS: CTA NECK FINDINGS SKELETON: Lower cervical degenerative disc disease. No bony spinal canal stenosis. OTHER NECK: Normal pharynx, larynx and major salivary glands. No cervical lymphadenopathy. Unremarkable thyroid gland. UPPER CHEST: No pneumothorax or pleural effusion. No nodules or masses. AORTIC ARCH: There is mild  calcific atherosclerosis of the aortic arch. There is no aneurysm, dissection or hemodynamically significant stenosis of the visualized ascending aorta and aortic arch. Conventional 3 vessel aortic branching pattern. The visualized proximal subclavian arteries are widely patent. RIGHT CAROTID SYSTEM: --Common carotid artery: Widely patent origin without common carotid artery dissection or aneurysm. --Internal carotid artery: No dissection, occlusion or aneurysm. Mild atherosclerotic calcification at the carotid bifurcation without hemodynamically significant stenosis. --External carotid artery: No acute abnormality. LEFT CAROTID SYSTEM: --Common carotid artery: Widely patent origin without common carotid artery dissection or aneurysm. --Internal carotid artery: No dissection, occlusion or aneurysm. There is mixed density atherosclerosis extending into the proximal ICA, resulting in 60% stenosis. --External carotid artery: No acute abnormality. VERTEBRAL ARTERIES: Left dominant configuration. Both origins are normal. The right vertebral artery is diminutive along its entire course with attenuated contrast-enhancement. There is complete loss of opacification beyond the proximal V3 segment. The dominant left vertebral artery is widely patent along its entire course. There is atherosclerotic calcification of the V4 segment without hemodynamically significant stenosis. CTA HEAD FINDINGS POSTERIOR CIRCULATION: --Basilar artery: Normal. --Posterior cerebral arteries: Normal. The right PCA is predominantly supplied by the posterior communicating artery. --Superior cerebellar arteries: Normal. --Inferior cerebellar arteries: Normal AICA bilaterally. The right PICA is supplied from the left PICA. ANTERIOR CIRCULATION: --Intracranial internal carotid arteries: Moderate atherosclerotic calcification. --Anterior cerebral arteries: Normal. Both A1 segments are present. Patent anterior communicating artery. --Middle cerebral  arteries: There is severe stenosis of the distal left M1 segment, which is otherwise normal caliber distally. The right middle cerebral artery is normal. --Posterior communicating arteries: Right p-comm gives rise to the right PCA. No left p-comm is visualized. VENOUS SINUSES: As permitted by contrast timing, patent. ANATOMIC VARIANTS: Fetal origin of the right posterior cerebral artery. DELAYED PHASE: Not performed Review of the MIP images confirms the above findings. IMPRESSION: 1. No emergent large vessel occlusion.  2. Severe stenosis of the distal left M1 segment, with preserved distal patency. 3. Attenuated appearance of the right vertebral artery with complete loss of patency at the proximal V3 segment. Right PICA supplied via collaterals from left PICA. 4. 60% stenosis of the proximal left internal carotid artery. These results were communicated to Dr. Karena Addison Aroor at 2:52 pm on 05/24/2018 by text page via the [facility] messaging system. Electronically Signed   By: Ulyses Jarred M.D.   On: 05/24/2018 14:51   Mr Brain Wo Contrast  Result Date: 05/24/2018 CLINICAL DATA:  Right facial droop EXAM: MRI HEAD WITHOUT CONTRAST TECHNIQUE: Multiplanar, multiecho pulse sequences of the brain and surrounding structures were obtained without intravenous contrast. COMPARISON:  CTA head neck 05/24/2018 FINDINGS: BRAIN: There is multifocal cortical and subcortical diffusion restriction within the left MCA territory. Additionally, there is a focus of abnormal diffusion restriction in the right frontal white matter. The midline structures are normal. No midline shift or other mass effect. There are old bilateral lacunar infarcts. There is an old left frontoparietal infarct. Multifocal periventricular white matter hyperintensity, most often a result of chronic microvascular ischemia. Generalized atrophy without lobar predilection. Susceptibility-sensitive sequences show no chronic microhemorrhage or superficial siderosis.  VASCULAR: Major intracranial arterial and venous sinus flow voids are normal. SKULL AND UPPER CERVICAL SPINE: Calvarial bone marrow signal is normal. There is no skull base mass. Visualized upper cervical spine and soft tissues are normal. SINUSES/ORBITS: No fluid levels or advanced mucosal thickening. No mastoid or middle ear effusion. The orbits are normal. IMPRESSION: 1. Multifocal acute ischemia within the left MCA territory, predominantly cortical and subcortical and near the watershed zones at the ACA/MCA and MCA/PCA junctions. These could be watershed or embolic infarcts. 2. Additional small focus of ischemia in the right frontal white matter, more suggestive of embolic etiology. 3. Chronic ischemic microangiopathy. 4. No hemorrhage or mass effect. Electronically Signed   By: Ulyses Jarred M.D.   On: 05/24/2018 21:05   Ct Head Code Stroke Wo Contrast  Result Date: 05/24/2018 CLINICAL DATA:  Code stroke. 85 y/o M; slurred speech and right facial droop. EXAM: CT HEAD WITHOUT CONTRAST TECHNIQUE: Contiguous axial images were obtained from the base of the skull through the vertex without intravenous contrast. COMPARISON:  12/13/2017 CT head. FINDINGS: Brain: No evidence of acute infarction, hemorrhage, hydrocephalus, extra-axial collection or mass lesion/mass effect. Stable small chronic left frontoparietal infarct and left lentiform nucleus lacunar infarct. Stable nonspecific white matter hypodensities compatible with chronic microvascular ischemic changes. Stable volume loss of the brain. Vascular: Calcific atherosclerosis of carotid siphons. No hyperdense vessel identified. Skull: Normal. Negative for fracture or focal lesion. Sinuses/Orbits: Mild maxillary and ethmoid sinus mucosal thickening. Normal aeration of the mastoid air cells. Other: None. ASPECTS [facility] Stroke Program Early CT Score) - Ganglionic level infarction (caudate, lentiform nuclei, internal capsule, insula, M1-M3 cortex): 7 -  Supraganglionic infarction (M4-M6 cortex): 3 Total score (0-10 with 10 being normal): 10 IMPRESSION: 1. No acute intracranial abnormality identified. 2. Stable small chronic left frontoparietal infarct. Stable chronic microvascular ischemic changes and volume loss of the brain. 3. ASPECTS is 10 These results were communicated to Dr. Lorraine Lax at 2:22 pmon 1/7/2020by text page via the [facility] messaging system. Electronically Signed   By: Kristine Garbe M.D.   On: 05/24/2018 14:25   Vas US Carotid  Result Date: 05/25/2018 Carotid Arterial Duplex Study Indications:       Stroke, Carotid stenosis on left. Other Factors:     Coronary Balloon  Angioplasty on 04/18/2018. Limitations:       Vessels tortuosity Comparison Study:  Carotid duplex exam on 03/29/2014 Performing Technologist: Rudell Cobb  Examination Guidelines: A complete evaluation includes B-mode imaging, spectral Doppler, color Doppler, and power Doppler as needed of all accessible portions of each vessel. Bilateral testing is considered an integral part of a complete examination. Limited examinations for reoccurring indications may be performed as noted.  Right Carotid Findings: +----------+--------+--------+--------+------------------------+--------+           PSV cm/sEDV cm/sStenosisDescribe                Comments +----------+--------+--------+--------+------------------------+--------+ CCA Prox  119     22              focal and calcific      tortuous +----------+--------+--------+--------+------------------------+--------+ CCA Distal69      22                                               +----------+--------+--------+--------+------------------------+--------+ ICA Prox  77      32      1-39%   diffuse and heterogenous         +----------+--------+--------+--------+------------------------+--------+ ICA Mid   58      21                                                +----------+--------+--------+--------+------------------------+--------+ ICA Distal78      30                                               +----------+--------+--------+--------+------------------------+--------+ ECA       96      12                                               +----------+--------+--------+--------+------------------------+--------+ +----------+--------+-------+--------+-------------------+           PSV cm/sEDV cmsDescribeArm Pressure (mmHG) +----------+--------+-------+--------+-------------------+ Subclavian119                                        +----------+--------+-------+--------+-------------------+ +---------+--------+--+--------+-+---------+ VertebralPSV cm/s26EDV cm/s5Antegrade +---------+--------+--+--------+-+---------+  Left Carotid Findings: +----------+--------+--------+--------+------------------------+--------+           PSV cm/sEDV cm/sStenosisDescribe                Comments +----------+--------+--------+--------+------------------------+--------+ CCA Prox  55      15              diffuse and heterogenoustortuous +----------+--------+--------+--------+------------------------+--------+ CCA Distal52      10                                               +----------+--------+--------+--------+------------------------+--------+ ICA Prox  78      26      1-39%   diffuse and calcific             +----------+--------+--------+--------+------------------------+--------+  ICA Mid   89      39                                               +----------+--------+--------+--------+------------------------+--------+ ICA Distal80      31                                               +----------+--------+--------+--------+------------------------+--------+ ECA       79      14                                               +----------+--------+--------+--------+------------------------+--------+  +----------+--------+--------+--------+-------------------+ SubclavianPSV cm/sEDV cm/sDescribeArm Pressure (mmHG) +----------+--------+--------+--------+-------------------+           109                                         +----------+--------+--------+--------+-------------------+ +---------+--------+--+--------+--+---------+ VertebralPSV cm/s65EDV cm/s22Antegrade +---------+--------+--+--------+--+---------+  Summary: Right Carotid: Velocities in the right ICA are consistent with a 1-39% stenosis. Left Carotid: Velocities in the left ICA are consistent with a 1-39% stenosis. Vertebrals: Bilateral vertebral arteries demonstrate antegrade flow. *See table(s) above for measurements and observations.     Preliminary      Assessment/Plan: Diagnosis: Multifocal acute ischemia within the left MCA territory  Labs and images (see above) independently reviewed.  Records reviewed and summated above. Stroke: Continue secondary stroke prophylaxis and Risk Factor Modification listed below:   Antiplatelet therapy:   Blood Pressure Management:  Continue current medication with prn's with permisive HTN per primary team Statin Agent:   Diabetes management:   Right sided hemiparesis Motor recovery: Fluoxetine  1. Does the need for close, 24 hr/day medical supervision in concert with the patient's rehab needs make it unreasonable for this patient to be served in a less intensive setting? Potentially  2. Co-Morbidities requiring supervision/potential complications: AAA, CAD with CABG (cont meds), diastolic congestive heart failure (monitor for signs/symptoms of fluid overload), suprapubic catheter 1 year, DM (Monitor in accordance with exercise and adjust meds as necessary), CVA 2014 with mild right side weakness maintained, hypokalemia (continue to monitor and replete as necessary), ABLA (repeat labs, transfuse to ensure appropriate perfusion for increased activity tolerance) 3. Due to  safety, disease management and patient education, does the patient require 24 hr/day rehab nursing? Potentially 4. Does the patient require coordinated care of a physician, rehab nurse, PT (1-2 hrs/day, 5 days/week) and OT (1-2 hrs/day, 5 days/week) to address physical and functional deficits in the context of the above medical diagnosis(es)? Potentially Addressing deficits in the following areas: balance, endurance, locomotion, strength, transferring, bathing, dressing, toileting and psychosocial support 5. Can the patient actively participate in an intensive therapy program of at least 3 hrs of therapy per day at least 5 days per week? Yes 6. The potential for patient to make measurable gains while on inpatient rehab is good 7. Anticipated functional outcomes upon discharge from inpatient rehab are supervision  with PT, supervision with OT, n/a with SLP. 8. Estimated rehab length of  stay to reach the above functional goals is: 4-7 days. 9. Anticipated D/C setting: Home 10. Anticipated post D/C treatments: HH therapy and Home excercise program 11. Overall Rehab/Functional Prognosis: good  RECOMMENDATIONS: This patient's condition is appropriate for continued rehabilitative care in the following setting: Patient would like to go home ASAP.  Given function of evaluation, believe this is reasonable with HH.  Recommend PM&R outpatient follow up. Patient has agreed to participate in recommended program. Yes Note that insurance prior authorization may be required for reimbursement for recommended care.  Comment: Rehab Admissions Coordinator to follow up.   I have personally performed a face to face diagnostic evaluation, including, but not limited to relevant history and physical exam findings, of this patient and developed relevant assessment and plan.  Additionally, I have reviewed and concur with the physician assistant's documentation above.   Delice Lesch, MD, ABPMR Lavon Paganini Angiulli,  PA-C 05/25/2018        Revision History                        Routing History

## 2018-05-27 NOTE — Progress Notes (Signed)
Physical Therapy Treatment Patient Details Name: [name] MRN: [id number] DOB: 1931-01-05 Today's Date: 05/27/2018    History of Present Illness Pt is an 85 y/o male admitted secondary to acute onset R sided weakness. MRI revealed multifocal acute ischemia with L MCA territory. PMH including but not limited to vasculopathic hx (including AAA, hx of CABG, MI, CHF, and carotid stenosis), Type 2 Diabetes, hx of stroke, HTN, HLD, CKD, and chronic anemia.    PT Comments    Pt continuing to make steady progress towards achieving his current functional mobility goals. Plan is for pt to d/c to CIR today.   Follow Up Recommendations  CIR     Equipment Recommendations  None recommended by PT    Recommendations for Other Services       Precautions / Restrictions Precautions Precautions: Fall Restrictions Weight Bearing Restrictions: No    Mobility  Bed Mobility Overal bed mobility: Modified Independent                Transfers Overall transfer level: Needs assistance Equipment used: None Transfers: Sit to/from Stand Sit to Stand: Min guard         General transfer comment: min guard for safety; pt with instability initially upon standing but did not require any physical assistance  Ambulation/Gait Ambulation/Gait assistance: Min guard;Min assist Gait Distance (Feet): 250 Feet Assistive device: 1 person hand held assist Gait Pattern/deviations: Step-through pattern;Decreased stride length;Drifts right/left Gait velocity: decreased   General Gait Details: pt continues to demonstrate fairly consistent R sided drift but not bumping into objects as much as previous session; min guard for safety with 1HHA and occasional need for min A for balance   Stairs Stairs: Yes Stairs assistance: Min guard Stair Management: Two rails;Step to pattern;Forwards Number of Stairs: 5(2 standard size steps and 3 half steps) General stair comments: min guard for  safety   Wheelchair Mobility    Modified Rankin (Stroke Patients Only) Modified Rankin (Stroke Patients Only) Pre-Morbid Rankin Score: No symptoms Modified Rankin: Moderately severe disability     Balance Overall balance assessment: Needs assistance Sitting-balance support: Feet supported Sitting balance-Leahy Scale: Good     Standing balance support: No upper extremity supported;During functional activity Standing balance-Leahy Scale: Poor                              Cognition Arousal/Alertness: Awake/alert Behavior During Therapy: WFL for tasks assessed/performed Overall Cognitive Status: Impaired/Different from baseline Area of Impairment: Safety/judgement;Awareness                         Safety/Judgement: Decreased awareness of deficits;Decreased awareness of safety Awareness: Intellectual          Exercises      General Comments        Pertinent Vitals/Pain Pain Assessment: No/denies pain    Home Living             Home Layout: Two level;Able to live on main level with bedroom/bathroom        Prior Function            PT Goals (current goals can now be found in the care plan section) Acute Rehab PT Goals PT Goal Formulation: With patient Time For Goal Achievement: 06/08/18 Potential to Achieve Goals: Good Progress towards PT goals: Progressing toward goals    Frequency    Min 4X/week      PT Plan  Current plan remains appropriate    Co-evaluation              AM-PAC PT "6 Clicks" Mobility   Outcome Measure  Help needed turning from your back to your side while in a flat bed without using bedrails?: None Help needed moving from lying on your back to sitting on the side of a flat bed without using bedrails?: None Help needed moving to and from a bed to a chair (including a wheelchair)?: A Little Help needed standing up from a chair using your arms (e.g., wheelchair or bedside chair)?: A Little Help  needed to walk in hospital room?: A Little Help needed climbing 3-5 steps with a railing? : A Little 6 Click Score: 20    End of Session Equipment Utilized During Treatment: Gait belt Activity Tolerance: Patient tolerated treatment well Patient left: in chair;with call bell/phone within reach;with chair alarm set;with family/visitor present Nurse Communication: Mobility status PT Visit Diagnosis: Other abnormalities of gait and mobility (R26.89);Other symptoms and signs involving the nervous system (R29.898)     Time: 3888-7579 PT Time Calculation (min) (ACUTE ONLY): 12 min  Charges:  $Gait Training: 8-22 mins                     Sherie Don, [place], DPT  Acute Rehabilitation Services Pager [phone] Office [place] 05/27/2018, 11:14 AM

## 2018-05-27 NOTE — H&P (Signed)
Physical Medicine and Rehabilitation Admission H&P        Chief Complaint  Patient presents with  . Code Stroke  : HPI: [name] is an 85 year old right-handed male with history of AAA, CAD with CABG, diastolic congestive heart failure, suprapubic catheter 1 year, diabetes mellitus, CVA 2016 with mild right side weakness maintained on aspirin and Plavix,previous balloon angioplasty of left ICA petrous segment 01/25/2015 per interventional radiology, chronic renal insufficiency. Per chart review and patient, patient lives with spouse. 2 level home with ramped entrance. Independent driving managing medications. 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 He has a daughter approximate 40 miles away that works. Presented  05/24/2018 with right-sided weakness. Cranial CT scan reviewed, unremarkable for acute intracranial process. Per report, stable small chronic left frontoparietal infarction. CT angiogram of head and neck with no emergent large vessel occlusion. 60% stenosis of proximal left ICA and severe stenosis of the distal left M1 segment with preserved distal patency. MRI the brain multifocal acute ischemia within the left MCA territory as well as additional small focus of ischemia in the right frontal white matter. Carotid Dopplers with no ICA stenosis. Echocardiogram with ejection fraction of 55% no wall motion abnormalities grade 1 diastolic dysfunction. Currently maintained on aspirin and Plavix for CVA prophylaxis. Subcutaneous Lovenox for DVT prophylaxis. Tolerating a regular diet. Interventional radiology consult to 05/26/2018 after CT angiogram head and neck reviewed current plans for outpatient cerebral angiogram question need for angioplasty/stenting 06/06/2018 7:30 AM. Therapy evaluations completed with recommendations of physical medicine rehabilitation consult. Patient was admitted for a comprehensive rehabilitation program.   Review of Systems    Constitutional: Negative for chills and fever.  HENT: Negative for hearing loss.   Eyes: Negative for blurred vision and double vision.  Respiratory: Negative for cough and shortness of breath.   Cardiovascular: Negative for chest pain, palpitations and leg swelling.  Gastrointestinal: Positive for constipation. Negative for nausea and vomiting.  Genitourinary: Positive for urgency. Negative for dysuria and flank pain.  Musculoskeletal: Positive for joint pain and myalgias.  Skin: Negative for rash.  Neurological: Positive for dizziness, sensory change and focal weakness.  All other systems reviewed and are negative.       Past Medical History:  Diagnosis Date  . Abdominal aortic aneurysm ([place])      a. Aortic duplex 06/2014: mild aneurysmal dilatation of proximal abdominal aorta measuring 3.4x3.4cm. No sig change from 2012. F/u due 06/2016;   . AKI (acute kidney injury) ([place]) 03/28/2016  . Arthritis      "right knee; never bothered me" (03/26/2016)  . 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 CAD in native artery      a. NSTEMI 11/2010 - CABG x2(LIMA to LAD, SVG to PDA). b. NEG Lexi MV 10/24/13, EF 53%, no perfusion abnormality, septal and apical HK noted. c. NSTEMI 05/2014 - s/p DES to SVG-RPDA 06/18/14 (Xience Alpine DES 3.0 x 18 mm -3.35 mm), EF 60-65; d. 03/2016 STEMI/PCI: LM nl, ost LAD 70%, mLCx 50%, pRCA 95% - mRCA 60%, dRPDA 70%, LIMA->LAD ok, o-p SVG->RPDA 100% (3.0x16 Promus DES overlapping prior stent).  . Diastolic dysfunction      a. 03/2016 Echo: EF 60-65%, no rwma, Gr1 DD, triv AI, Ao root 59mm, Asc Ao 58mm, triv MR.  . Dizziness      a. Carotid duplex 03/2014: mild fibrous plaque, no significant stenosis.  Raelyn Number  catheter in place      "been wearing it for a couple months now" (03/26/2016)  . HTN (hypertension)    . Hyperlipidemia LDL goal <70    . Medical history non-contributory    . Non-STEMI (non-ST elevated myocardial infarction)  ([place]) 11/2010; 03/2018    1. Ostial LAD 70% (to close to [facility] for PCI), subtotal occlusion of the RCA; 2.  In-stent restenosis SVG-RCA -PTCA.  Marland Kitchen Postoperative atrial fibrillation ([place]) 11/2010    Post CABG, no sign recurrence  . Refusal of blood transfusions as patient is Jehovah's Witness    . 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 Servando Snare)  . Sleep apnea      Not on CPAP. (03/26/2016)  . ST elevation myocardial infarction (STEMI) of inferior wall ([place]) 04/05/2016    Occluded in-stent restenosis/thrombosis of SVG-RCA --> treated with overlapping Promus DES 3.0 mm 16 mm postdilated 3.0 mm).  . Stroke [facility]) 2014; 01/2015    a. 2014 with mild right hand weakness, nonhemorrhagic per pt.;; b. - PTA-Stent L ICA 95%   . Type II diabetes mellitus ([place])           Past Surgical History:  Procedure Laterality Date  . APPENDECTOMY      . CARDIAC CATHETERIZATION   12/11/2010    Dr. Chase Picket - subsequent cath - normal LV systolic function, no renal artery stenosis, severe 2-vessel disease with subtotaled RCA prox and distal 60% lesion and complex 70% area of narrowing of ostium of LAD  . CARDIAC CATHETERIZATION N/A 03/26/2016    Procedure: Left Heart Cath and Coronary Angiography;  Surgeon: Nelva Bush, MD;  Location: [place] [facility];  Service: Cardiovascular;  Laterality: N/A;  . CARDIAC CATHETERIZATION N/A 03/26/2016    Procedure: Coronary Stent Intervention;  Surgeon: Nelva Bush, MD;  Location: [place] [facility];  Service: Cardiovascular: 100% In-stent thrombosis of pros SVG-RCA (Xience DES) --> treated with PromusDES 3.0 x 18 (3.3 mm)  . CARDIAC CATHETERIZATION N/A 03/26/2016    Procedure: Bypass Graft Angiography;  Surgeon: Nelva Bush, MD;  Location: [place] [facility];  Service: Cardiovascular;  Laterality: N/A;  . CORONARY ANGIOPLASTY WITH STENT PLACEMENT   06/18/2014    PCI to SVG-RPDA 06/18/14 (Xience Alpine DES 3.0 x 18 mm -3.35 mm),   . CORONARY ARTERY BYPASS GRAFT    12/15/2010    X2, Dr Servando Snare; LIMA to LAD, SVG to PDA;   . CORONARY BALLOON ANGIOPLASTY N/A 04/18/2018    Procedure: CORONARY BALLOON ANGIOPLASTY;  Surgeon: Leonie Man, MD;  Location: [place] [facility];  Service: Cardiovascular;;; high pressure scoring and noncompliant balloon PTCA of SVG-RCA ISR ostial and proximal  . CORONARY/GRAFT ANGIOGRAPHY N/A 08/27/2017    Procedure: CORONARY/GRAFT ANGIOGRAPHY;  Surgeon: Nelva Bush, MD;  Location: [place] [facility];  Service: Cardiovascular;; pLAD 70%, ostD1 50%.  mCx 60%, OM2 80%. pRCA 95% & mRCA 100% - rPDA 70%. LIMA-mLAD patent. SVG-rPDA 10% ISR.   Marland Kitchen CYSTOSCOPY WITH URETHRAL DILATATION      . IR ANGIO INTRA EXTRACRAN SEL COM CAROTID INNOMINATE BILAT MOD SED   02/25/2017  . IR ANGIO INTRA EXTRACRAN SEL COM CAROTID INNOMINATE BILAT MOD SED   10/19/2017    Dr. Kathee Delton: L Common Carotid - ECA & major branches widely patent. ICA ~20% distal to bulb & 50% in supraclinoid segment. LMCA-distal 1/3 MI ~905 stenosis with post-stenotic dilation into inferior division. ~50% prox Basilar A stenosis @ anterior  Inf Cerebellar A. 50% R ICA  . IR ANGIO VERTEBRAL SEL SUBCLAVIAN INNOMINATE UNI L MOD SED   02/25/2017  . IR ANGIO VERTEBRAL SEL SUBCLAVIAN INNOMINATE UNI R MOD SED   10/19/2017  . IR ANGIO VERTEBRAL SEL VERTEBRAL UNI L MOD SED   10/19/2017  . IR GENERIC HISTORICAL   01/21/2016    IR RADIOLOGIST EVAL & MGMT 01/21/2016 [facility]INTER[facility]  . IR GENERIC HISTORICAL   02/03/2016    IR CATHETER TUBE CHANGE 02/03/2016 Marybelle Killings, MD [facility]INTER[facility]  . IR RADIOLOGIST EVAL & MGMT   11/09/2017  . LEFT HEART CATH AND CORONARY ANGIOGRAPHY N/A 04/18/2018    Procedure: LEFT HEART CATH AND CORONARY ANGIOGRAPHY;  Surgeon: Leonie Man, MD;  Location: [place] [facility];  Service: Cardiovascular;  Laterality: N/A; stable findings on last cath with exception of 75% in-stent restenosis of SVG-RCA ostial stent treated with PTCA.    Marland Kitchen LEFT HEART CATHETERIZATION WITH CORONARY  ANGIOGRAM N/A 06/18/2014    Procedure: LEFT HEART CATHETERIZATION WITH CORONARY ANGIOGRAM;  Surgeon: Leonie Man, MD;  Location: [facility] C[facility] LAB;  Service: Cardiovascular;  -- severe disease of SVG-rPDA  . NO PAST SURGERIES      . RADIOLOGY WITH ANESTHESIA N/A 01/24/2015    Procedure: STENT ASSISTED ANGIOPLASTY (RADIOLOGY WITH ANESTHESIA);  Surgeon: Luanne Bras, MD;  Location: [place];  Service: Radiology;  Laterality: N/A;  . TONSILLECTOMY      . TRANSTHORACIC ECHOCARDIOGRAM   08/2017; 04/2018:     A) EF 60-65%. Mild LVH. No RWMA. Gr 1 DD. Mod LA dilation.;  B)  EF 35-40%.  Severe LVH.  GRII DD.  Apical anteroseptal hypokinesis.  Akinesis of the apex.         Family History  Problem Relation Age of Onset  . Heart Problems Father 53    Social History:  reports that he quit smoking about 59 years ago. His smoking use included cigarettes. He has a 3.30 pack-year smoking history. He has never used smokeless tobacco. He reports that he does not drink alcohol or use drugs. Allergies:       Allergies  Allergen Reactions  . Other Other (See Comments)      No  BLOOD PRODUCTS - Pt is Jehovah's Witness  . Statins Other (See Comments)      Cause muscle aches  . Atorvastatin Other (See Comments)      Myalgias  . Crestor [Rosuvastatin] Other (See Comments)      Myalgias   . Pravastatin Other (See Comments)      Myalgias  . Simvastatin Other (See Comments)      Myalgias            Medications Prior to Admission  Medication Sig Dispense Refill  . amLODipine (NORVASC) 10 MG tablet Take 10 mg by mouth every evening.       Marland Kitchen aspirin 81 MG chewable tablet Chew 1 tablet (81 mg total) by mouth daily.      . carvedilol (COREG) 6.25 MG tablet Take 0.5 tablets (3.125 mg total) by mouth every morning AND 1 tablet (6.25 mg total) every evening. (Patient taking differently: Take 3.125 mg by mouth in the morning and 6.25 mg in the evening) 135 tablet 3  . ciprofloxacin (CIPRO) 500 MG tablet Take 500 mg by  mouth 2 (two) times daily. FOR 10 DAYS      . clopidogrel (PLAVIX) 75 MG tablet Take 1 tablet (75 mg total) by mouth daily. 90 tablet 3  .  doxazosin (CARDURA) 1 MG tablet Take 1 tablet (1 mg total) by mouth daily. 30 tablet 0  . Ferrous Sulfate Dried (FERROUS SULFATE IRON) 200 (65 Fe) MG TABS Take 1 tablet by mouth 2 (two) times daily after a meal.      . furosemide (LASIX) 20 MG tablet Take 1 tablet (20 mg total) by mouth daily. 30 tablet 0  . glimepiride (AMARYL) 4 MG tablet Take 4 mg by mouth 2 (two) times daily with a meal.       . isosorbide mononitrate (IMDUR) 60 MG 24 hr tablet Take 1 tablet (60 mg total) by mouth 2 (two) times daily. 60 tablet 0  . metFORMIN (GLUCOPHAGE) 1000 MG tablet Take 1 tablet (1,000 mg total) by mouth 2 (two) times daily.      . nitroGLYCERIN (NITROSTAT) 0.4 MG SL tablet Place 1 tablet (0.4 mg total) under the tongue every 5 (five) minutes as needed for chest pain (up to 3 doses). (Patient taking differently: Place 0.4 mg under the tongue every 5 (five) minutes x 3 doses as needed for chest pain. ) 25 tablet 3  . oxybutynin (DITROPAN) 5 MG tablet Take 5 mg by mouth daily as needed for bladder spasms.       . carvedilol (COREG) 12.5 MG tablet Take 6.25 mg by mouth 2 (two) times daily with a meal.      . hydrALAZINE (APRESOLINE) 100 MG tablet Take 1 tablet (100 mg total) by mouth 2 (two) times daily. DISCONTINUE PERVIOUS DOSE  PATIENT WILL CALL WHEN REFILL IS NEEDED (Patient not taking: Reported on 05/24/2018) 180 tablet 3      Drug Regimen Review Drug regimen was reviewed and remains appropriate with no significant issues identified   Home: Home Living Family/patient expects to be discharged to:: Private residence Living Arrangements: Spouse/significant other(mother in law lives with him and his wife) Available Help at Discharge: Family, Available 24 hours/day(daughter and friends are supervising wife and mother in Sports coach ) Type of Home: House Home Access: Ramped  entrance Home Layout: Two level Alternate Level Stairs-Number of Steps: flight Alternate Level Stairs-Rails: Right Bathroom Shower/Tub: Tub/shower unit, Multimedia programmer: Handicapped height Bathroom Accessibility: Yes Home Equipment: Civil engineer, contracting, Radio producer - single point, Crutches, Environmental consultant - 2 wheels, Wheelchair - manual  Lives With: Spouse(mother in [employer] coach)   Functional History: Prior Function Level of Independence: Independent Comments: independent, driving, and managing medication; 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    Functional Status:  Mobility: Bed Mobility Overal bed mobility: Modified Independent General bed mobility comments: seated OOB in recliner upon entry Transfers Overall transfer level: Needs assistance Equipment used: None Transfers: Sit to/from Stand Sit to Stand: Min guard General transfer comment: min guard for safety; pt with instability initially upon standing but did not require any physical assistance Ambulation/Gait Ambulation/Gait assistance: Min assist, Min guard Gait Distance (Feet): 200 Feet(200' x2) Assistive device: None Gait Pattern/deviations: Step-through pattern, Decreased stride length, Drifts right/left General Gait Details: pt with consistent R sided drift with no awareness, bumping into objects on his R side throughout. Overall min A for stability and safety throughout with occasional brief periods of min guard for safety Gait velocity: decreased   ADL: ADL Overall ADL's : Needs assistance/impaired Eating/Feeding: Modified independent Grooming: Wash/dry hands, Wash/dry face, Oral care, Min guard, Standing Upper Body Bathing: Minimal assistance, Sitting Lower Body Bathing: Minimal assistance, Sit to/from stand Upper Body Dressing : Minimal assistance, Sitting Lower Body Dressing: Sit to/from stand, Minimal assistance Toilet  Transfer: Minimal assistance, Ambulation Toilet Transfer Details (indicate cue type and reason):  simulated in room Toileting- Clothing Manipulation and Hygiene: Moderate assistance, Sit to/from stand Functional mobility during ADLs: Minimal assistance General ADL Comments: patient demonstrates decreased functional use of R UE, affecting independence with daily activities    Cognition: Cognition Overall Cognitive Status: Impaired/Different from baseline Orientation Level: Oriented X4 Cognition Arousal/Alertness: Awake/alert Behavior During Therapy: WFL for tasks assessed/performed Overall Cognitive Status: Impaired/Different from baseline Area of Impairment: Safety/judgement, Awareness Memory: Decreased short-term memory Safety/Judgement: Decreased awareness of deficits, Decreased awareness of safety Awareness: Intellectual Problem Solving: Requires verbal cues General Comments: Unsure if pt truly with deficit or if he is minimizing deficits due to caregiving responsibilities and need to return home.  When challenged he will smuggly smile and say "yeah, I know"    Physical Exam: Blood pressure (!) 150/62, pulse (!) 54, temperature 98 F (36.7 C), temperature source Oral, resp. rate 18, height 6\' 1"  (1.854 m), weight 98.1 kg, SpO2 98 %. Physical Exam  Constitutional: He appears well-developed.  HENT:  Head: Normocephalic and atraumatic.  Eyes: Pupils are equal, round, and reactive to light. Conjunctivae are normal.  Neck: Normal range of motion.  Cardiovascular: Normal rate and regular rhythm. Exam reveals no friction rub.  No murmur heard. Respiratory: Effort normal. No respiratory distress.  GI: Soft. He exhibits no distension. There is no abdominal tenderness.  Genitourinary:    Genitourinary Comments: Suprapubic tube in place   Neurological:  Patient is alert in no acute distress. Follows full commands. Fair awareness of deficits. LUE and LLE 4 to 4+/5. RUE and RLE:3+ to 4-/5. Senses pain in all 4's   Skin: Skin is warm.      Lab Results Last 48 Hours        Results  for orders placed or performed during the hospital encounter of 05/24/18 (from the past 48 hour(s))  Glucose, capillary     Status: None    Collection Time: 05/25/18  7:50 AM  Result Value Ref Range    Glucose-Capillary 77 70 - 99 mg/dL    Comment 1 Procedure Error    Glucose, capillary     Status: Abnormal    Collection Time: 05/25/18 11:48 AM  Result Value Ref Range    Glucose-Capillary 204 (H) 70 - 99 mg/dL    Comment 1 Notify RN      Comment 2 Document in Chart    Glucose, capillary     Status: Abnormal    Collection Time: 05/25/18  4:57 PM  Result Value Ref Range    Glucose-Capillary 166 (H) 70 - 99 mg/dL    Comment 1 Notify RN      Comment 2 Document in Chart    Glucose, capillary     Status: Abnormal    Collection Time: 05/25/18  9:17 PM  Result Value Ref Range    Glucose-Capillary 258 (H) 70 - 99 mg/dL  CBC     Status: Abnormal    Collection Time: 05/26/18  5:38 AM  Result Value Ref Range    WBC 5.6 4.0 - 10.5 K/uL    RBC 2.82 (L) 4.22 - 5.81 MIL/uL    Hemoglobin 7.9 (L) 13.0 - 17.0 g/dL    HCT 24.7 (L) 39.0 - 52.0 %    MCV 87.6 80.0 - 100.0 fL    MCH 28.0 26.0 - 34.0 pg    MCHC 32.0 30.0 - 36.0 g/dL    RDW 18.1 (H) 11.5 - 15.5 %  Platelets 227 150 - 400 K/uL    nRBC 0.0 0.0 - 0.2 %      Comment: Performed at [facility], [place] [address], [place], [place] [address]  Basic metabolic panel     Status: Abnormal    Collection Time: 05/26/18  5:38 AM  Result Value Ref Range    Sodium 138 135 - 145 mmol/L    Potassium 3.6 3.5 - 5.1 mmol/L    Chloride 107 98 - 111 mmol/L    CO2 23 22 - 32 mmol/L    Glucose, Bld 109 (H) 70 - 99 mg/dL    BUN 19 8 - 23 mg/dL    Creatinine, Ser 1.47 (H) 0.61 - 1.24 mg/dL    Calcium 8.4 (L) 8.9 - 10.3 mg/dL    GFR calc non Af Amer 43 (L) >60 mL/min    GFR calc Af Amer 50 (L) >60 mL/min    Anion gap 8 5 - 15      Comment: Performed at [place] [address], [place], [place] [address]  VITAMIN D 25 Hydroxy (Vit-D  Deficiency, Fractures)     Status: Abnormal    Collection Time: 05/26/18  5:38 AM  Result Value Ref Range    Vit D, 25-Hydroxy 8.8 (L) 30.0 - 100.0 ng/mL      Comment: (NOTE) Vitamin D deficiency has been defined by the Institute of Medicine and an Endocrine Society practice guideline as a level of serum 25-OH vitamin D less than 20 ng/mL (1,2). The Endocrine Society went on to further define vitamin D insufficiency as a level between 21 and 29 ng/mL (2). 1. IOM (Institute of Medicine). 2010. Dietary reference   intakes for calcium and D. [place]: The   [employer]. 2. Holick MF, Binkley [place], Bischoff-Ferrari HA, et al.   Evaluation, treatment, and prevention of vitamin D   deficiency: an Endocrine Society clinical practice   guideline. JCEM. 2011 Jul; 96(7):1911-30. Performed At: [facility] [place], [place] [id number] Rush Farmer MD IR:[id number]    TSH     Status: None    Collection Time: 05/26/18  5:38 AM  Result Value Ref Range    TSH 0.609 0.350 - 4.500 uIU/mL      Comment: Performed by a 3rd Generation assay with a functional sensitivity of <=0.01 uIU/mL. Performed at [facility], [place] [address], [place], [place] [address]    Glucose, capillary     Status: Abnormal    Collection Time: 05/26/18  6:02 AM  Result Value Ref Range    Glucose-Capillary 113 (H) 70 - 99 mg/dL  Glucose, capillary     Status: Abnormal    Collection Time: 05/26/18 12:42 PM  Result Value Ref Range    Glucose-Capillary 266 (H) 70 - 99 mg/dL  Glucose, capillary     Status: Abnormal    Collection Time: 05/26/18  5:16 PM  Result Value Ref Range    Glucose-Capillary 216 (H) 70 - 99 mg/dL  Glucose, capillary     Status: Abnormal    Collection Time: 05/26/18  9:54 PM  Result Value Ref Range    Glucose-Capillary 226 (H) 70 - 99 mg/dL  Glucose, capillary     Status: Abnormal    Collection Time: 05/27/18  6:21 AM  Result Value Ref Range     Glucose-Capillary 112 (H) 70 - 99 mg/dL    Comment 1 Notify RN      Comment  2 Document in Chart         Imaging Results (Last 48 hours)  Vas US Carotid   Result Date: 05/25/2018 Carotid Arterial Duplex Study Indications:       Stroke, Carotid stenosis on left. Other Factors:     Coronary Balloon Angioplasty on 04/18/2018. Limitations:       Vessels tortuosity Comparison Study:  Carotid duplex exam on 03/29/2014 Performing Technologist: Rudell Cobb  Examination Guidelines: A complete evaluation includes B-mode imaging, spectral Doppler, color Doppler, and power Doppler as needed of all accessible portions of each vessel. Bilateral testing is considered an integral part of a complete examination. Limited examinations for reoccurring indications may be performed as noted.  Right Carotid Findings: +----------+--------+--------+--------+------------------------+--------+           PSV cm/sEDV cm/sStenosisDescribe                Comments +----------+--------+--------+--------+------------------------+--------+ CCA Prox  119     22              focal and calcific      tortuous +----------+--------+--------+--------+------------------------+--------+ CCA Distal69      22                                               +----------+--------+--------+--------+------------------------+--------+ ICA Prox  77      32      1-39%   diffuse and heterogenous         +----------+--------+--------+--------+------------------------+--------+ ICA Mid   58      21                                               +----------+--------+--------+--------+------------------------+--------+ ICA Distal78      30                                               +----------+--------+--------+--------+------------------------+--------+ ECA       96      12                                               +----------+--------+--------+--------+------------------------+--------+  +----------+--------+-------+--------+-------------------+           PSV cm/sEDV cmsDescribeArm Pressure (mmHG) +----------+--------+-------+--------+-------------------+ Subclavian119                                        +----------+--------+-------+--------+-------------------+ +---------+--------+--+--------+-+---------+ VertebralPSV cm/s26EDV cm/s5Antegrade +---------+--------+--+--------+-+---------+  Left Carotid Findings: +----------+--------+--------+--------+------------------------+--------+           PSV cm/sEDV cm/sStenosisDescribe                Comments +----------+--------+--------+--------+------------------------+--------+ CCA Prox  55      15              diffuse and heterogenoustortuous +----------+--------+--------+--------+------------------------+--------+ CCA Distal52      10                                               +----------+--------+--------+--------+------------------------+--------+  ICA Prox  78      26      1-39%   diffuse and calcific             +----------+--------+--------+--------+------------------------+--------+ ICA Mid   89      39                                               +----------+--------+--------+--------+------------------------+--------+ ICA Distal80      31                                               +----------+--------+--------+--------+------------------------+--------+ ECA       79      14                                               +----------+--------+--------+--------+------------------------+--------+ +----------+--------+--------+--------+-------------------+ SubclavianPSV cm/sEDV cm/sDescribeArm Pressure (mmHG) +----------+--------+--------+--------+-------------------+           109                                         +----------+--------+--------+--------+-------------------+ +---------+--------+--+--------+--+---------+ VertebralPSV cm/s65EDV  cm/s22Antegrade +---------+--------+--+--------+--+---------+  Summary: Right Carotid: Velocities in the right ICA are consistent with a 1-39% stenosis. Left Carotid: Velocities in the left ICA are consistent with a 1-39% stenosis. Vertebrals: Bilateral vertebral arteries demonstrate antegrade flow. *See table(s) above for measurements and observations.  Electronically signed by Antony Contras MD on 05/25/2018 at 12:47:48 PM.    Final              Medical Problem List and Plan: 1.  Left side weakness secondary to left MCA infarction, high-grade left MCA M1 stenosis.Plan   outpatient cerebral angiogram question angioplasty stenting 06/06/2018 at 7:30 AM per interventional radiology             -admit to inpatient rehab 2.  DVT Prophylaxis/Anticoagulation: Subcutaneous Lovenox. Monitor for any bleeding episodes 3. Pain Management:  Tylenol as needed 4. Mood:  Provide emotional support 5. Neuropsych: This patient is capable of making decisions on his own behalf. 6. Skin/Wound Care:   Routine skin checks 7. Fluids/Electrolytes/Nutrition:  Routine ins and outs with follow-up chemistries 8.  History of CAD with CABG. No chest pain or shortness of breath 9. Diastolic congestive heart failure. Monitor for signs of fluid overload. Patient on Lasix 20 mg daily prior to admission. Resume as needed             -check daily weights 10. Diabetes mellitus peripheral neuropathy. Hemoglobin A1c 8.2. SSI. Patient Amaryl 4 mg twice a day, Glucophage 1000 mg twice a day prior to admission. Resume as needed 11. Hypertension. Permissive hypertension. Patient on Norvasc 10 mg daily, Coreg 3.125 mg every a.m. and 6.25 mg every p.m.,Imdur 60 mg twice a day, hydralazine 100 mg twice a day prior to admission and resume as needed 12. Chronic renal insufficiency. Baseline creatinine 1.41. 13. Hyperlipidemia. Crestor 14. Chronic anemia. Continue iron supplement     Post Admission Physician Evaluation: 1. Functional  deficits secondary  to left  MCA infarct. 2. Patient is admitted to receive collaborative, interdisciplinary care between the physiatrist, rehab nursing staff, and therapy team. 3. Patient's level of medical complexity and substantial therapy needs in context of that medical necessity cannot be provided at a lesser intensity of care such as a SNF. 4. Patient has experienced substantial functional loss from his/her baseline which was documented above under the "Functional History" and "Functional Status" headings.  Judging by the patient's diagnosis, physical exam, and functional history, the patient has potential for functional progress which will result in measurable gains while on inpatient rehab.  These gains will be of substantial and practical use upon discharge  in facilitating mobility and self-care at the household level. 5. Physiatrist will provide 24 hour management of medical needs as well as oversight of the therapy plan/treatment and provide guidance as appropriate regarding the interaction of the two. 6. The Preadmission Screening has been reviewed and patient status is unchanged unless otherwise stated above. 7. 24 hour rehab nursing will assist with bladder management, bowel management, safety, skin/wound care, disease management, medication administration, pain management and patient education  and help integrate therapy concepts, techniques,education, etc. 8. PT will assess and treat for/with: Lower extremity strength, range of motion, stamina, balance, functional mobility, safety, adaptive techniques and equipment, NMR, family education.   Goals are: mod I. 9. OT will assess and treat for/with: ADL's, functional mobility, safety, upper extremity strength, adaptive techniques and equipment, NMR, family education, community reentry.   Goals are: mod I. Therapy may proceed with showering this patient. 10. SLP will assess and treat for/with: n/a.  Goals are: n/a. 11. Case Management and  Social Worker will assess and treat for psychological issues and discharge planning. 12. Team conference will be held weekly to assess progress toward goals and to determine barriers to discharge. 13. Patient will receive at least 3 hours of therapy per day at least 5 days per week. 14. ELOS: 5-7 days       15. Prognosis:  excellent     I have personally performed a face to face diagnostic evaluation of this patient and formulated the key components of the plan.  Additionally, I have personally reviewed laboratory data, imaging studies, as well as relevant notes and concur with the physician assistant's documentation above.   Meredith Staggers, MD, Mellody Drown     Lavon Paganini [place], PA-C 05/27/2018   The patient's status has not changed. The original post admission physician evaluation remains appropriate, and any changes from the pre-admission screening or documentation from the acute chart are noted above.  Meredith Staggers, MD 05/27/2018

## 2018-05-27 NOTE — Progress Notes (Signed)
[name] Gong, RN  Rehab Admission Coordinator  Physical Medicine and Rehabilitation  PMR Pre-admission  Signed  Date of Service:  05/26/2018 4:16 PM       Related encounter: ED to Hosp-Admission (Current) from 05/24/2018 in [facility]      Signed         Show:Clear all [x] Manual[x] Template[x] Copied  Added by: [x] [name] Gong, RN  [] Hover for details PMR Admission Coordinator Pre-Admission Assessment  Patient: [name] is an 85 y.o., male MRN: [id number] DOB: 01-20-1931 Height: 6\' 1"  (185.4 cm) Weight: 98.1 kg                                                                                                                                                  Insurance Information HMO:     PPO:      PCP:      IPA:      80/20:      OTHER: no HMO PRIMARY: Medicare a and b      Policy#: [id number]      Subscriber: pt Benefits:  Phone #: passport one online     Name: 05/26/2018 Eff. Date: 01/16/1998     Deduct: $ 1408      Out of Pocket Max: none      Life Max: none CIR: 100%      SNF: 20 full days Outpatient: 80%     Co-Pay: 20% Home Health: 100%      Co-Pay: none DME: 80%     Co-Pay: 20 % Providers: pt choice  SECONDARY: A[employer] supplement      Policy#: [id number]      Subscriber: pt  Medicaid Application Date:       Case Manager:  Disability Application Date:       Case Worker:   Emergency Contact Information         Contact Information    Name Relation Home Work [place] Spouse [phone]     [name],[name] Daughter [phone]  [place] [phone]       Current Medical History  Patient Admitting Diagnosis: Left MCA infarct  History of Present Illness:[name] is an 85 year old right-handed male with history of AAA, CAD with CABG, diastolic congestive heart failure, suprapubic catheter 1 year, diabetes mellitus, CVA 2016 with mild right side weakness maintained on aspirin  and Plavix,previous balloon angioplasty of left ICA petrous segment 01/25/2015 per interventional radiology, chronic renal insufficiency.  Presented 05/24/2018 with right-sided weakness. Cranial CT scan reviewed, unremarkable for acute intracranial process. Per report, stable small chronic left frontoparietal infarction. CT angiogram of head and neck with no emergent large vessel occlusion. 60% stenosis of proximal left ICA and severe stenosis of the distal left M1 segment with preserved distal patency. MRI the brain multifocal acute ischemia within the left  MCA territory as well as additional small focus of ischemia in the right frontal white matter. Carotid Dopplers with no ICA stenosis. Echocardiogram with ejection fraction of 55% no wall motion abnormalities grade 1 diastolic dysfunction. Currently maintained on aspirin and Plavix for CVA prophylaxis. Subcutaneous Lovenox for DVT prophylaxis. Tolerating a regular diet. Interventional radiology consult to 05/26/2018 after CT angiogram head and neck reviewed current plans for outpatient cerebral angiogram question need for angioplasty/stenting 06/06/2018 7:30 AM.   Complete NIHSS TOTAL: 2  Past Medical History      Past Medical History:  Diagnosis Date  . Abdominal aortic aneurysm ([place])    a. Aortic duplex 06/2014: mild aneurysmal dilatation of proximal abdominal aorta measuring 3.4x3.4cm. No sig change from 2012. F/u due 06/2016;   . AKI (acute kidney injury) ([place]) 03/28/2016  . Arthritis    "right knee; never bothered me" (03/26/2016)  . 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 CAD in native artery    a. NSTEMI 11/2010 - CABG x2(LIMA to LAD, SVG to PDA). b. NEG Lexi MV 10/24/13, EF 53%, no perfusion abnormality, septal and apical HK noted. c. NSTEMI 05/2014 - s/p DES to SVG-RPDA 06/18/14 (Xience Alpine DES 3.0 x 18 mm -3.35 mm), EF 60-65; d. 03/2016 STEMI/PCI: LM nl, ost LAD 70%, mLCx 50%, pRCA 95% - mRCA  60%, dRPDA 70%, LIMA->LAD ok, o-p SVG->RPDA 100% (3.0x16 Promus DES overlapping prior stent).  . Diastolic dysfunction    a. 03/2016 Echo: EF 60-65%, no rwma, Gr1 DD, triv AI, Ao root 85mm, Asc Ao 63mm, triv MR.  . Dizziness    a. Carotid duplex 03/2014: mild fibrous plaque, no significant stenosis.  . Foley catheter in place    "been wearing it for a couple months now" (03/26/2016)  . HTN (hypertension)   . Hyperlipidemia LDL goal <70   . Medical history non-contributory   . Non-STEMI (non-ST elevated myocardial infarction) ([place]) 11/2010; 03/2018   1. Ostial LAD 70% (to close to [facility] for PCI), subtotal occlusion of the RCA; 2.  In-stent restenosis SVG-RCA -PTCA.  Marland Kitchen Postoperative atrial fibrillation ([place]) 11/2010   Post CABG, no sign recurrence  . Refusal of blood transfusions as patient is Jehovah's Witness   . 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 Servando Snare)  . Sleep apnea    Not on CPAP. (03/26/2016)  . ST elevation myocardial infarction (STEMI) of inferior wall ([place]) 04/05/2016   Occluded in-stent restenosis/thrombosis of SVG-RCA --> treated with overlapping Promus DES 3.0 mm 16 mm postdilated 3.0 mm).  . Stroke [facility]) 2014; 01/2015   a. 2014 with mild right hand weakness, nonhemorrhagic per pt.;; b. - PTA-Stent L ICA 95%   . Type II diabetes mellitus ([facility])     Family History  family history includes Heart Problems (age of onset: 24) in his father.  Prior Rehab/Hospitalizations:  Has the patient had major surgery during 100 days prior to admission? No  Current Medications   Current Facility-Administered Medications:  .  acetaminophen (TYLENOL) tablet 650 mg, 650 mg, Oral, Q6H PRN, 650 mg at 05/26/18 2153 **OR** acetaminophen (TYLENOL) suppository 650 mg, 650 mg, Rectal, Q6H PRN, Santos-Sanchez, Idalys, MD .  aspirin chewable tablet 81 mg, 81 mg, Oral, Daily, Santos-Sanchez, Idalys, MD, 81 mg at 05/27/18 0829 .  ciprofloxacin (CIPRO) tablet 500 mg,  500 mg, Oral, BID, Santos-Sanchez, Idalys, MD, 500 mg at 05/27/18 0829 .  clopidogrel (PLAVIX) tablet 75 mg, 75  mg, Oral, Daily, Santos-Sanchez, Idalys, MD, 75 mg at 05/27/18 0828 .  enoxaparin (LOVENOX) injection 40 mg, 40 mg, Subcutaneous, Q24H, Santos-Sanchez, Idalys, MD, 40 mg at 05/26/18 2153 .  ferrous sulfate tablet 325 mg, 325 mg, Oral, BID WC, Lucious Groves, DO, 325 mg at 05/27/18 9381 .  insulin aspart (novoLOG) injection 0-5 Units, 0-5 Units, Subcutaneous, QHS, Santos-Sanchez, Idalys, MD, 2 Units at 05/26/18 2241 .  insulin aspart (novoLOG) injection 0-9 Units, 0-9 Units, Subcutaneous, TID WC, Santos-Sanchez, Idalys, MD, 2 Units at 05/25/18 1819 .  rosuvastatin (CRESTOR) tablet 20 mg, 20 mg, Oral, Daily, Carroll Sage, MD, 20 mg at 05/27/18 8299  Patients Current Diet:     Diet Order                  Diet heart healthy/carb modified Room service appropriate? 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 None of the above  Prior Functional Level Prior Function Level of Independence: Independent Comments: independent, driving, and managing medication; 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   Self Care: Did the patient need help bathing, dressing, using the toilet or eating?  Independent  Indoor Mobility: Did the patient need assistance with walking from room to room  (with or without device)? Independent  Stairs: Did the patient need assistance with internal or external stairs (with or without device)? Independent  Functional Cognition: Did the patient need help planning regular tasks such as shopping or remembering to take medications? Independent  Current Functional Level Cognition  Overall Cognitive Status: Impaired/Different from baseline Orientation Level: Oriented X4 Safety/Judgement: Decreased awareness of deficits, Decreased awareness of safety General Comments: Unsure if pt truly with deficit or if he is minimizing deficits due to caregiving responsibilities and need to return home.  When challenged he will smuggly smile and say "yeah, I know"     Extremity Assessment (includes Sensation/Coordination)  Upper Extremity Assessment: RUE deficits/detail RUE Deficits / Details: Pt demonstrates movement in [place] stage V RUE Sensation: decreased light touch, decreased proprioception RUE Coordination: decreased fine motor, decreased gross motor  Lower Extremity Assessment: Overall WFL for tasks assessed    ADLs  Overall ADL's : Needs assistance/impaired Eating/Feeding: Modified independent Grooming: Wash/dry hands, Wash/dry face, Oral care, Min guard, Standing Upper Body Bathing: Minimal assistance, Sitting Lower Body Bathing: Minimal assistance, Sit to/from stand Upper Body Dressing : Minimal assistance, Sitting Lower Body Dressing: Sit to/from stand, Minimal assistance Toilet Transfer: Minimal assistance, Ambulation Toilet Transfer Details (indicate cue type and reason): simulated in room Toileting- Clothing Manipulation and Hygiene: Moderate assistance, Sit to/from stand Functional mobility during ADLs: Minimal assistance General ADL Comments: patient demonstrates decreased functional use of R UE, affecting independence with daily activities     Mobility  Overal bed mobility: Modified Independent General bed mobility  comments: seated OOB in recliner upon entry    Transfers  Overall transfer level: Needs assistance Equipment  used: None Transfers: Sit to/from Stand Sit to Stand: [employer] transfer comment: min guard for safety; pt with instability initially upon standing but did not require any physical assistance    Ambulation / Gait / Stairs / Wheelchair Mobility  Ambulation/Gait Ambulation/Gait assistance: Min assist, Counsellor (Feet): 200 Feet(200' x2) Assistive device: None Gait Pattern/deviations: Step-through pattern, Decreased stride length, Drifts right/left General Gait Details: pt with consistent R sided drift with no awareness, bumping into objects on his R side throughout. Overall min A for stability and safety throughout with occasional brief periods of min guard for safety Gait velocity: decreased    Posture / Balance Dynamic Sitting Balance Sitting balance - Comments: pt able to don socks seated EOB with supervision Balance Overall balance assessment: Needs assistance Sitting-balance support: Feet supported Sitting balance-Leahy Scale: Good Sitting balance - Comments: pt able to don socks seated EOB with supervision Standing balance support: No upper extremity supported, During functional activity Standing balance-Leahy Scale: Poor Standing balance comment: preference to UE support    Special needs/care consideration BiPAP/CPAP n/a CPM n/a Continuous Drip IV n/a Dialysis n/a Life Vest n/a Oxygen n/a Special Bed n/a Trach Size n/a Wound Vac n/a Skin S-P catheter for 1 year Bowel mgmt: continent LBM 1/7 Bladder mgmt: S- P catheter; uses leg bag Diabetic mgmt Hgb A1c 8.2   Previous Home Environment Living Arrangements: Spouse/significant other(mother in law lives with him and his wife)  Lives With: Spouse(mother in Sports coach) Available Help at Discharge: Family, Available 24 hours/day(daughter and friends are supervising wife and mother in Sports coach  ) Type of Home: House Home Layout: Two level, Able to live on main level with bedroom/bathroom Alternate Level Stairs-Rails: Right Alternate Level Stairs-Number of Steps: flight Home Access: Ramped entrance Bathroom Shower/Tub: Tub/shower unit, Multimedia programmer: Handicapped height Bathroom Accessibility: Yes How Accessible: Accessible via walker Home Care Services: No  Discharge Living Setting Plans for Discharge Living Setting: Patient's home, Lives with (comment)(wife and mother in law) Type of Home at Discharge: House Discharge Home Layout: Two level, Able to live on main level with bedroom/bathroom Alternate Level Stairs-Rails: Right Alternate Level Stairs-Number of Steps: flight Discharge Home Access: Ramped entrance Discharge Bathroom Shower/Tub: Tub/shower unit Discharge Bathroom Toilet: Handicapped height Discharge Bathroom Accessibility: Yes How Accessible: Accessible via walker Does the patient have any problems obtaining your medications?: No  Social/Family/Support Systems Patient Roles: Parent, 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; cooks, cleans, e) Sport and exercise psychologist Information: wife [name] and daughter, Jeani Hawking Anticipated Caregiver: Jeani Hawking and very close friends Anticipated Caregiver's Contact Information: see above Ability/Limitations of Caregiver: Jeani Hawking works Monday Wed and Friday Caregiver Availability: Intermittent Discharge Plan Discussed with Primary Caregiver: Yes Is Caregiver In Agreement with Plan?: Yes Does Caregiver/Family have Issues with Lodging/Transportation while Pt is in Rehab?: No  Goals/Additional Needs Patient/Family Goal for Rehab: Mod I with PT and OT Expected length of stay: ELOs 4 to 7 days Special Service Needs: decreased safety awareness; minimizes deficits jokingly Additional Information: Wants to d/c home asap for he is a caregiver Pt/Family Agrees to Admission and willing to participate: Yes Program Orientation  Provided & Reviewed with Pt/Caregiver Including Roles  & Responsibilities: Yes  Decrease burden of Care through IP rehab admission: n/a  Possible need for SNF placement upon discharge: not anticipated  Patient Condition: This patient's medical and functional status has changed since the consult dated 05/25/2018 in which the Rehabilitation Physician determined and documented that the patient was potentially appropriate for  intensive rehabilitative care in an inpatient rehabilitation facility. Patient is now in agreement to admit for caregiver support has been arranged for pt's wife and mother in law while he is admitted.  Issues have been addressed and update has been discussed with Dr. Naaman Plummer and patient now appropriate for inpatient rehabilitation. Will admit to inpatient rehab today.   Preadmission Screen Completed By:  Cleatrice Burke, 05/27/2018 8:53 AM ______________________________________________________________________   Discussed status with Dr. Naaman Plummer on 05/27/2018 at [phone] and received telephone approval for admission today.  Admission Coordinator:  Cleatrice Burke, time 8466 Date 05/27/2018           Cosigned by: Meredith Staggers, MD at 05/27/2018 9:40 AM  Revision History

## 2018-05-27 NOTE — Progress Notes (Deleted)
[name] Arn, MD    [name] Arn, MD  Physician  Physical Medicine and Rehabilitation      Consult Note  Signed     Date of Service:  05/25/2018 12:21 PM         Related encounter: ED to Hosp-Admission (Current) from 05/24/2018 in [facility]             Signed          Expand All Collapse All            Expand widget buttonCollapse widget button    Show:Clear all   ManualTemplateCopied  Added by:     Angiulli, Lavon Paganini, PA-C  [name] Arn, MD   Hover for detailscustomization button                                                                                                                                                           untitled image              Physical Medicine and Rehabilitation Consult  Reason for Consult: Right side weakness  Referring Physician: [facility]        HPI: [name] is a 85 y.o. right-handed male with history of AAA, CAD with CABG, diastolic congestive heart failure, suprapubic catheter 1 year, diabetes mellitus, CVA 2014 with mild right side weakness maintained on aspirin, chronic renal insufficiency. Per chart review and patient, patient lives with spouse. 2 level home with ramped entrance. 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 He has a daughter approximately 40 miles away and works every other day. Presented on 12/05/2018 with right side weakness. Cranial CT scan reviewed, unremarkable for acute intracranial process. Per report, stable small chronic left frontoparietal infarction. CT angiogram of head and neck with no emergent large vessel occlusion. 60% stenosis of proximal left ICA. MRI of the brain multifocal acute  ischemia within the left MCA territory as well as additional small focus of ischemia in the right frontal white matter. Carotid Dopplers with no ICA stenosis. Echocardiogram pending. Therapy evaluation completed with recommendations of physical medicine rehabilitation consult.     Review of Systems   Constitutional: Negative for fever.   HENT: Negative for hearing loss.    Eyes: Negative for blurred vision and double vision.   Cardiovascular: Negative for chest pain and palpitations.   Gastrointestinal: Positive for constipation. Negative for nausea and vomiting.   Genitourinary: Negative for dysuria and hematuria.   Musculoskeletal: Positive for joint pain and myalgias.   Skin: Negative for rash.   Neurological: Positive for dizziness, sensory change and focal weakness.   All other systems  reviewed and are negative.          Past Medical History:    Diagnosis   Date    .   Abdominal aortic aneurysm ([place])            a. Aortic duplex 06/2014: mild aneurysmal dilatation of proximal abdominal aorta measuring 3.4x3.4cm. No sig change from 2012. F/u due 06/2016;     .   AKI (acute kidney injury) ([place])   03/28/2016    .   Arthritis            "right knee; never bothered me" (03/26/2016)    .   Balanitis xerotica obliterans            with meatal stenosis and distal stricture    .   CAD in native artery            a. NSTEMI 11/2010 - CABG x2(LIMA to LAD, SVG to PDA). b. NEG Lexi MV 10/24/13, EF 53%, no perfusion abnormality, septal and apical HK noted. c. NSTEMI 05/2014 - s/p DES to SVG-RPDA 06/18/14 (Xience Alpine DES 3.0 x 18 mm -3.35 mm), EF 60-65; d. 03/2016 STEMI/PCI: LM nl, ost LAD 70%, mLCx 50%, pRCA 95% - mRCA 60%, dRPDA 70%, LIMA->LAD ok, o-p SVG->RPDA 100% (3.0x16 Promus DES overlapping prior stent).    .   Diastolic dysfunction            a. 03/2016 Echo: EF 60-65%, no rwma, Gr1 DD, triv AI, Ao root 30mm, Asc Ao 66mm,  triv MR.    .   Dizziness            a. Carotid duplex 03/2014: mild fibrous plaque, no significant stenosis.    .   Foley catheter in place            "been wearing it for a couple months now" (03/26/2016)    .   HTN (hypertension)        .   Hyperlipidemia LDL goal <70        .   Non-STEMI (non-ST elevated myocardial infarction) ([place])   11/2010; 03/2018        1. Ostial LAD 70% (to close to [facility] for PCI), subtotal occlusion of the RCA; 2.  In-stent restenosis SVG-RCA -PTCA.    Marland Kitchen   Postoperative atrial fibrillation ([place])   11/2010        Post CABG, no sign recurrence    .   Refusal of blood transfusions as patient is Jehovah's Witness        .   S/P CABG x 17 November 2010        LIMA-LAD, SVG to PDA (Dr. Servando Snare)    .   Sleep apnea            Not on CPAP. (03/26/2016)    .   ST elevation myocardial infarction (STEMI) of inferior wall ([place])   04/05/2016        Occluded in-stent restenosis/thrombosis of SVG-RCA --> treated with overlapping Promus DES 3.0 mm 16 mm postdilated 3.0 mm).    .   Stroke [facility])   2014; 01/2015        a. 2014 with mild right hand weakness, nonhemorrhagic per pt.;; b. - PTA-Stent L ICA 95%     .   Type II diabetes mellitus ([place])                 Past Surgical History:    Procedure  Laterality   Date    .   APPENDECTOMY            .   CARDIAC CATHETERIZATION       12/11/2010        Dr. Chase Picket - subsequent cath - normal LV systolic function, no renal artery stenosis, severe 2-vessel disease with subtotaled RCA prox and distal 60% lesion and complex 70% area of narrowing of ostium of LAD    .   CARDIAC CATHETERIZATION   N/A   03/26/2016        Procedure: Left Heart Cath and Coronary Angiography;  Surgeon: Nelva Bush, MD;  Location: [place] CV LAB;  Service: Cardiovascular;  Laterality: N/A;    .   CARDIAC  CATHETERIZATION   N/A   03/26/2016        Procedure: Coronary Stent Intervention;  Surgeon: Nelva Bush, MD;  Location: [place] CV [facility];  Service: Cardiovascular: 100% In-stent thrombosis of pros SVG-RCA (Xience DES) --> treated with PromusDES 3.0 x 18 (3.3 mm)    .   CARDIAC CATHETERIZATION   N/A   03/26/2016        Procedure: Bypass Graft Angiography;  Surgeon: Nelva Bush, MD;  Location: [place] [facility];  Service: Cardiovascular;  Laterality: N/A;    .   CORONARY ANGIOPLASTY WITH STENT PLACEMENT       06/18/2014        PCI to SVG-RPDA 06/18/14 (Xience Alpine DES 3.0 x 18 mm -3.35 mm),     .   CORONARY ARTERY BYPASS GRAFT       12/15/2010        X2, Dr Servando Snare; LIMA to LAD, SVG to PDA;     .   CORONARY BALLOON ANGIOPLASTY   N/A   04/18/2018        Procedure: CORONARY BALLOON ANGIOPLASTY;  Surgeon: Leonie Man, MD;  Location: [place] [facility];  Service: Cardiovascular;;; high pressure scoring and noncompliant balloon PTCA of SVG-RCA ISR ostial and proximal    .   CORONARY/GRAFT ANGIOGRAPHY   N/A   08/27/2017        Procedure: CORONARY/GRAFT ANGIOGRAPHY;  Surgeon: Nelva Bush, MD;  Location: [place] [facility];  Service: Cardiovascular;; pLAD 70%, ostD1 50%.  mCx 60%, OM2 80%. pRCA 95% & mRCA 100% - rPDA 70%. LIMA-mLAD patent. SVG-rPDA 10% ISR.     Marland Kitchen   CYSTOSCOPY WITH URETHRAL DILATATION            .   IR ANGIO INTRA EXTRACRAN SEL COM CAROTID INNOMINATE BILAT MOD SED       02/25/2017    .   IR ANGIO INTRA EXTRACRAN SEL COM CAROTID INNOMINATE BILAT MOD SED       10/19/2017        Dr. Kathee Delton: L Common Carotid - ECA & major branches widely patent. ICA ~20% distal to bulb & 50% in supraclinoid segment. LMCA-distal 1/3 MI ~905 stenosis with post-stenotic dilation into inferior division. ~50% prox Basilar A stenosis @ anterior Inf Cerebellar A. 50% R ICA    .   IR ANGIO VERTEBRAL SEL  SUBCLAVIAN INNOMINATE UNI L MOD SED       02/25/2017    .   IR ANGIO VERTEBRAL SEL SUBCLAVIAN INNOMINATE UNI R MOD SED       10/19/2017    .   IR ANGIO VERTEBRAL SEL VERTEBRAL UNI L MOD SED       10/19/2017    .  IR GENERIC HISTORICAL       01/21/2016        IR RADIOLOGIST EVAL & MGMT 01/21/2016 [facility]INTER[facility] RAD    .   IR GENERIC HISTORICAL       02/03/2016        IR CATHETER TUBE CHANGE 02/03/2016 Marybelle Killings, MD [facility]INTER[facility] RAD    .   IR RADIOLOGIST EVAL & MGMT       11/09/2017    .   LEFT HEART CATH AND CORONARY ANGIOGRAPHY   N/A   04/18/2018        Procedure: LEFT HEART CATH AND CORONARY ANGIOGRAPHY;  Surgeon: Leonie Man, MD;  Location: [place] [facility];  Service: Cardiovascular;  Laterality: N/A; stable findings on last cath with exception of 75% in-stent restenosis of SVG-RCA ostial stent treated with PTCA.      Marland Kitchen   LEFT HEART CATHETERIZATION WITH CORONARY ANGIOGRAM   N/A   06/18/2014        Procedure: LEFT HEART CATHETERIZATION WITH CORONARY ANGIOGRAM;  Surgeon: Leonie Man, MD;  Location[facility] C[facility];  Service: Cardiovascular;  -- severe disease of SVG-rPDA    .   RADIOLOGY WITH ANESTHESIA   N/A   01/24/2015        Procedure: STENT ASSISTED ANGIOPLASTY (RADIOLOGY WITH ANESTHESIA);  Surgeon: Luanne Bras, MD;  Location: [place];  Service: Radiology;  Laterality: N/A;    .   TONSILLECTOMY            .   TRANSTHORACIC ECHOCARDIOGRAM       08/2017; 04/2018:         A) EF 60-65%. Mild LVH. No RWMA. Gr 1 DD. Mod LA dilation.;  B)  EF 35-40%.  Severe LVH.  GRII DD.  Apical anteroseptal hypokinesis.  Akinesis of the apex.             Family History    Problem   Relation   Age of Onset    .   Heart Problems   Father   33       Social History:  reports that he quit smoking about 59 years ago. His smoking use included cigarettes. He has a 3.30 pack-year smoking history. He  has never used smokeless tobacco. He reports that he does not drink alcohol or use drugs.  Allergies:         Allergies    Allergen   Reactions    .   Other   Other (See Comments)            No  BLOOD PRODUCTS - Pt is Jehovah's Witness    .   Statins   Other (See Comments)            Cause muscle aches    .   Atorvastatin   Other (See Comments)            Myalgias    .   Crestor [Rosuvastatin]   Other (See Comments)            Myalgias     .   Pravastatin   Other (See Comments)            Myalgias    .   Simvastatin   Other (See Comments)            Myalgias                 Medications Prior to Admission    Medication  Sig   Dispense   Refill    .   amLODipine (NORVASC) 10 MG tablet   Take 10 mg by mouth every evening.             Marland Kitchen   aspirin 81 MG chewable tablet   Chew 1 tablet (81 mg total) by mouth daily.            .   carvedilol (COREG) 6.25 MG tablet   Take 0.5 tablets (3.125 mg total) by mouth every morning AND 1 tablet (6.25 mg total) every evening. (Patient taking differently: Take 3.125 mg by mouth in the morning and 6.25 mg in the evening)   135 tablet   3    .   ciprofloxacin (CIPRO) 500 MG tablet   Take 500 mg by mouth 2 (two) times daily. FOR 10 DAYS            .   clopidogrel (PLAVIX) 75 MG tablet   Take 1 tablet (75 mg total) by mouth daily.   90 tablet   3    .   doxazosin (CARDURA) 1 MG tablet   Take 1 tablet (1 mg total) by mouth daily.   30 tablet   0    .   Ferrous Sulfate Dried (FERROUS SULFATE IRON) 200 (65 Fe) MG TABS   Take 1 tablet by mouth 2 (two) times daily after a meal.            .   furosemide (LASIX) 20 MG tablet   Take 1 tablet (20 mg total) by mouth daily.   30 tablet   0    .   glimepiride (AMARYL) 4 MG tablet   Take 4 mg by mouth 2 (two) times daily with a meal.              .   isosorbide mononitrate (IMDUR) 60 MG 24 hr tablet   Take 1 tablet (60 mg total) by mouth 2 (two) times daily.   60 tablet   0    .   metFORMIN (GLUCOPHAGE) 1000 MG tablet   Take 1 tablet (1,000 mg total) by mouth 2 (two) times daily.            .   nitroGLYCERIN (NITROSTAT) 0.4 MG SL tablet   Place 1 tablet (0.4 mg total) under the tongue every 5 (five) minutes as needed for chest pain (up to 3 doses). (Patient taking differently: Place 0.4 mg under the tongue every 5 (five) minutes x 3 doses as needed for chest pain. )   25 tablet   3    .   oxybutynin (DITROPAN) 5 MG tablet   Take 5 mg by mouth daily as needed for bladder spasms.             .   carvedilol (COREG) 12.5 MG tablet   Take 6.25 mg by mouth 2 (two) times daily with a meal.            .   hydrALAZINE (APRESOLINE) 100 MG tablet   Take 1 tablet (100 mg total) by mouth 2 (two) times daily. DISCONTINUE PERVIOUS DOSE   PATIENT WILL CALL WHEN REFILL IS NEEDED (Patient not taking: Reported on 05/24/2018)   180 tablet   3          Home:  Home Living  Family/patient expects to be discharged to:: Private residence  Living Arrangements: Spouse/significant other, Parent  Available Help at Discharge: Family  Type  of Home: House  Home Access: Ramped entrance  Home Layout: Two level  Alternate Level Stairs-Number of Steps: flight  Alternate Level Stairs-Rails: Right  Bathroom Shower/Tub: Tub/shower unit, Tourist information centre manager: Handicapped height  Home Equipment: Shower seat, Radio producer - single point, Crutches, Environmental consultant - 2 wheels, Wheelchair - manual   Functional History:  Prior Function  Level of Independence: Independent  Comments: independent, driving, and managing medication; 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   Functional Status:   Mobility:  Bed Mobility  Overal bed mobility: Modified Independent  General bed mobility comments: seated  OOB in recliner upon entry  Transfers  Overall transfer level: Needs assistance  Equipment used: None  Transfers: Sit to/from Stand  Sit to Stand: Min assist  General transfer comment: min A for stability with transition as pt slightly unsteady initially  Ambulation/Gait  Ambulation/Gait assistance: Min assist  Gait Distance (Feet): 100 Feet  Assistive device: 1 person hand held assist  Gait Pattern/deviations: Step-through pattern, Decreased stride length, Drifts right/left  General Gait Details: pt with consistent R sided drift with no awareness, bumping into objects on his R side throughout. Overall min A for stability and safety throughout  Gait velocity: decreased       ADL:  ADL  Overall ADL's : Needs assistance/impaired  Grooming: Minimal assistance, Standing, Wash/dry hands  Upper Body Bathing: Minimal assistance, Sitting  Lower Body Bathing: Minimal assistance, Sit to/from stand  Upper Body Dressing : Minimal assistance, Sitting  Lower Body Dressing: Sit to/from stand, Minimal assistance  Toilet Transfer: Minimal assistance, Ambulation  Toilet Transfer Details (indicate cue type and reason): simulated in room  Toileting- Clothing Manipulation and Hygiene: Moderate assistance, Sit to/from stand  Functional mobility during ADLs: Minimal assistance  General ADL Comments: patient demonstrates decreased functional use of R UE, affecting independence with daily activities      Cognition:  Cognition  Overall Cognitive Status: Impaired/Different from baseline  Orientation Level: Oriented X4  Cognition  Arousal/Alertness: Awake/alert  Behavior During Therapy: WFL for tasks assessed/performed  Overall Cognitive Status: Impaired/Different from baseline  Area of Impairment: Memory, Safety/judgement, Problem solving  Memory: Decreased short-term memory  Safety/Judgement: Decreased awareness of deficits  Problem Solving: Difficulty  sequencing, Requires verbal cues     Blood pressure (!) 176/78, pulse 65, temperature 98.6 F (37 C), resp. rate 18, height 6\' 1"  (1.854 m), weight 98.1 kg, SpO2 98 %.  Physical Exam   Vitals reviewed.  Constitutional: He is oriented to person, place, and time. He appears well-developed and well-nourished.   HENT:   Head: Normocephalic and atraumatic.   Eyes: EOM are normal. Right eye exhibits no discharge. Left eye exhibits no discharge.   Neck: Normal range of motion. Neck supple. No thyromegaly present.   Cardiovascular: Normal rate and regular rhythm.   Respiratory: Effort normal and breath sounds normal. No respiratory distress.   GI: Soft. Bowel sounds are normal. He exhibits no distension.  Genitourinary:    Genitourinary Comments: Suprapubic tube in place    Musculoskeletal:     Comments: No edema or tenderness in extremities  Neurological: He is alert and oriented to person, place, and time.  Motor: RUE: 4+/5 proximal to distal LUE: 4-/5 proximal to distal with apraxia RLE: HF 4-/5, KE 4/5, ADF 5/5 LLE: HF 3/5, KE 3/5, ADF 4+/5 Sensation diminished to light touch RUE   Skin: Skin is warm and dry.  Psychiatric: He has a normal mood and affect. His behavior is normal.  Lab Results Last 24 Hours  Imaging Results (Last 48 hours)                                                            Assessment/Plan:  Diagnosis: Multifocal acute ischemia within the left MCA territory   Labs and images (see above) independently reviewed.  Records reviewed and summated above.  Stroke:  Continue secondary stroke prophylaxis and Risk Factor Modification listed below:    Antiplatelet therapy:    Blood Pressure Management:  Continue current medication with prn's with permisive HTN per primary team  Statin Agent:    Diabetes management:    Right sided hemiparesis  Motor recovery: Fluoxetine     1.Does the need for close, 24 hr/day medical supervision in concert with the patient's rehab needs make it unreasonable for this patient to be served in a less intensive setting? Potentially    2.Co-Morbidities requiring supervision/potential complications: AAA, CAD with CABG (cont meds), diastolic congestive heart failure (monitor for signs/symptoms of fluid overload), suprapubic catheter 1 year, DM (Monitor in accordance with exercise and adjust meds as necessary), CVA 2014 with mild right side weakness maintained, hypokalemia (continue to monitor and replete as necessary), ABLA (repeat labs, transfuse to ensure appropriate perfusion for increased activity tolerance)   3.Due to  safety, disease management and patient education, does the patient require 24 hr/day rehab nursing? Potentially   4.Does the patient require coordinated care of a physician, rehab nurse, PT (1-2 hrs/day, 5 days/week) and OT (1-2 hrs/day, 5 days/week) to address physical and functional deficits in the context of the above medical diagnosis(es)? Potentially Addressing deficits in the following areas: balance, endurance, locomotion, strength, transferring, bathing, dressing, toileting and psychosocial support   5.Can the patient actively participate in an intensive therapy program of at least 3 hrs of therapy per day at least 5 days per week? Yes   6.The potential for patient to make measurable gains while on inpatient rehab is good   7.Anticipated functional outcomes upon discharge from inpatient rehab are supervision  with PT, supervision with OT, n/a with SLP.   8.Estimated rehab length of stay to reach the above functional goals is: 4-7 days.   9.Anticipated D/C setting: Home   10.Anticipated post D/C treatments: HH therapy and Home excercise program   11.Overall Rehab/Functional Prognosis: good      RECOMMENDATIONS:  This patient's condition is appropriate for continued rehabilitative care in the following setting: Patient would like to go home ASAP.  Given function of evaluation, believe this is reasonable with HH.  Recommend PM&R outpatient follow up.  Patient has agreed to participate in recommended program. Yes  Note that insurance prior authorization may be required for reimbursement for recommended care.     Comment: Rehab Admissions Coordinator to follow up.        I have personally performed a face to face diagnostic evaluation, including, but not limited to relevant history and physical exam findings, of this patient and developed relevant assessment and plan.  Additionally, I have reviewed and concur with the physician assistant's documentation above.       Delice Lesch, MD, ABPMR  Lavon Paganini Angiulli, PA-C  05/25/2018                Revision History  Routing History

## 2018-05-27 NOTE — Progress Notes (Signed)
Patient arrived to [facility] via wheelchair. Patient denies pain at time. oriented to unit and room. No questions from patient at time.

## 2018-05-27 NOTE — Progress Notes (Signed)
Went to see/evaluate patient in [facility] with Dr. Estanislado Pandy.  Patient awake and alert sitting in bed with no complaints at this time.  On physical exam patient demonstrates weakness of right hand grip (4/5 of right hand grip versus 5/5 of left hand grip).   Discussed patient's condition of left MCA stenosis and how this is contributing to his stroke like symptoms. Explained that the best course of management for patient at this time is with a procedure called an image-guided cerebral arteriogram with possible angioplasty/stent placement of his left MCA stenosis. Explained procedure, including risks (specifically risk of stroke being 3-5%) and benefits. Patient is scheduled for procedure 06/06/2018 in IR with Dr. Estanislado Pandy. Instructed patient to call us with questions/concerns.  Patient is currently taking Plavix 75 mg once daily and Aspirin 81 mg once daily. Will obtain P2Y12 today to assess effectiveness of Plavix.  All questions answered and concerns addressed. Patient conveys understanding and agrees with plan.  Please call IR with questions/concerns.  Bea Graff Louk, PA-C 05/27/2018, 9:32 AM

## 2018-05-27 NOTE — Care Management Note (Signed)
Case Management Note  Patient Details  Name: [name] MRN: [id number] Date of Birth: 1930/08/24  Subjective/Objective:                    Action/Plan: Pt discharging to CIR today. CM signing off.  Expected Discharge Date:                  Expected Discharge Plan:  [place]  In-House Referral:     Discharge planning Services  CM Consult  Post Acute Care Choice:    Choice offered to:     DME Arranged:    DME Agency:     HH Arranged:    [place] Agency:     Status of Service:  Completed, signed off  If discussed at [employer] of Stay Meetings, dates discussed:    Additional Comments:  Pollie Friar, RN 05/27/2018, 10:38 AM

## 2018-05-27 NOTE — Progress Notes (Signed)
Inpatient Rehabilitation Admissions Coordinator  Discussed with Dr. Naaman Plummer. We can admit pt to inpt rehab today. I met with patient at bedside and he is in agreement. I will notify RN, RN CM, SW an Attending service. I will make the arrangements to admit today.  Danne Baxter, RN, MSN Rehab Admissions Coordinator [phone] 05/27/2018 8:50 AM

## 2018-05-27 NOTE — Progress Notes (Signed)
Patient transferred to room [id number] via wheelchair. Family aware.

## 2018-05-27 NOTE — Care Management Important Message (Signed)
Important Message  Patient Details  Name: [name] MRN: [id number] Date of Birth: 1931-01-17   Medicare Important Message Given:  Yes    Orbie Pyo 05/27/2018, 3:33 PM

## 2018-05-27 NOTE — Progress Notes (Signed)
Report called to [place], Therapist, sports on [facility].

## 2018-05-28 ENCOUNTER — Inpatient Hospital Stay (HOSPITAL_COMMUNITY): Payer: Medicare Other | Admitting: Occupational Therapy

## 2018-05-28 ENCOUNTER — Inpatient Hospital Stay (HOSPITAL_COMMUNITY): Payer: Medicare Other | Admitting: Physical Therapy

## 2018-05-28 DIAGNOSIS — I10 Essential (primary) hypertension: Secondary | ICD-10-CM

## 2018-05-28 DIAGNOSIS — N183 Chronic kidney disease, stage 3 unspecified: Secondary | ICD-10-CM

## 2018-05-28 DIAGNOSIS — E1142 Type 2 diabetes mellitus with diabetic polyneuropathy: Secondary | ICD-10-CM

## 2018-05-28 DIAGNOSIS — D631 Anemia in chronic kidney disease: Secondary | ICD-10-CM

## 2018-05-28 DIAGNOSIS — E1165 Type 2 diabetes mellitus with hyperglycemia: Secondary | ICD-10-CM

## 2018-05-28 DIAGNOSIS — D638 Anemia in other chronic diseases classified elsewhere: Secondary | ICD-10-CM

## 2018-05-28 DIAGNOSIS — N189 Chronic kidney disease, unspecified: Secondary | ICD-10-CM

## 2018-05-28 LAB — GLUCOSE, CAPILLARY
GLUCOSE-CAPILLARY: 119 mg/dL — AB (ref 70–99)
Glucose-Capillary: 112 mg/dL — ABNORMAL HIGH (ref 70–99)
Glucose-Capillary: 136 mg/dL — ABNORMAL HIGH (ref 70–99)
Glucose-Capillary: 137 mg/dL — ABNORMAL HIGH (ref 70–99)

## 2018-05-28 NOTE — Evaluation (Signed)
Occupational Therapy Assessment and Plan  Patient Details  Name: [name] MRN: [id number] Date of Birth: 12-08-1930  OT Diagnosis: abnormal posture, hemiplegia affecting non-dominant side and muscle weakness (generalized) Rehab Potential: Rehab Potential (ACUTE ONLY): Excellent ELOS: 5-7 days   Today's Date: 05/28/2018 OT Individual Time: 0175-1025 OT Individual Time Calculation (min): 75 min     Problem List:  Patient Active Problem List   Diagnosis Date Noted  . Anemia of chronic disease   . CKD (chronic kidney disease), stage III ([place])   . Poorly controlled type 2 diabetes mellitus with peripheral neuropathy ([place])   . Left middle cerebral artery stroke ([place]) 05/27/2018  . Vitamin D deficiency 05/27/2018  . Acute CVA (cerebrovascular accident) ([place]) 05/25/2018  . Right arm weakness 05/25/2018  . Right sided weakness   . Coronary artery disease involving coronary bypass graft of native heart without angina pectoris   . History of CVA with residual deficit   . Acute blood loss anemia   . Dilated cardiomyopathy ([place]) 04/26/2018  . Fatigue 04/26/2018  . Chronic combined systolic and diastolic CHF (congestive heart failure) ([place])   . Leukocytosis 04/17/2018  . Chronic diastolic CHF (congestive heart failure) ([place]) 04/17/2018  . History of suprapubic catheter 04/17/2018  . Chronic anemia 04/17/2018  . Chest pain 01/28/2018  . Pseudomonas urinary tract infection   . Hydrocele, right   . Olecranon bursitis of left elbow   . CKD (chronic kidney disease) stage 3, GFR 30-59 ml/min (HCC)   . Suprapubic catheter ([place])   . History of urethral stricture   . Hemispheric carotid artery syndrome 12/22/2016  . AKI (acute kidney injury) ([place]) 03/28/2016  . S/P angioplasty with stent 03/26/16 to VG to PDA for in-stent restenosis with DES. 03/28/2016  . History of ST elevation myocardial infarction (STEMI) 03/28/2016  . Carpal tunnel syndrome of right wrist 06/24/2015  . History of  stroke 03/21/2015  . Diabetes mellitus treated with oral medication ([place]) 03/21/2015  . Stenosis of left carotid artery   . Essential hypertension   . Hyperlipidemia LDL goal <70   . Cerebral infarction due to stenosis of left middle cerebral artery ([place]) 01/22/2015  . CAD S/P percutaneous coronary angioplasty 09/19/2014  . Coronary artery disease involving coronary bypass graft of native heart with angina pectoris ([place]) 09/19/2014  . Shortness of breath 09/19/2014  . Polypharmacy 09/19/2014  . Abdominal aortic aneurysm ([place])   . Stroke ([place])   . Hyperlipidemia associated with type 2 diabetes mellitus ([place])   . Type II diabetes mellitus with complication ([place])   . Coronary artery disease involving native coronary artery of native heart with angina pectoris ([place])   . Abnormal TSH 06/19/2014  . Obesity 06/19/2014  . STEMI (ST elevation myocardial infarction) ([place]) 06/18/2014  . NSTEMI (non-ST elevated myocardial infarction) ([place]) 06/17/2014  . Bilateral lower extremity edema 03/30/2014  . Dizziness 02/21/2014  . Bradycardia 08/31/2013  . Postoperative atrial fibrillation (HCC)     Class: Temporary  . Diabetes mellitus type 2 in obese ([place])   . Apnea, sleep   . Balanitis xerotica obliterans   . History of: Non-STEMI (non-ST elevated myocardial infarction) 11/16/2010    Class: History of  . S/P CABG x 2 11/16/2010    Past Medical History:  Past Medical History:  Diagnosis Date  . Abdominal aortic aneurysm ([place])    a. Aortic duplex 06/2014: mild aneurysmal dilatation of proximal abdominal aorta measuring 3.4x3.4cm. No sig change from 2012. F/u due 06/2016;   Marland Kitchen  AKI (acute kidney injury) ([place]) 03/28/2016  . Arthritis    "right knee; never bothered me" (03/26/2016)  . 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 CAD in native artery    a. NSTEMI 11/2010 - CABG x2(LIMA to LAD, SVG to PDA). b. NEG Lexi MV 10/24/13, EF 53%, no perfusion abnormality, septal  and apical HK noted. c. NSTEMI 05/2014 - s/p DES to SVG-RPDA 06/18/14 (Xience Alpine DES 3.0 x 18 mm -3.35 mm), EF 60-65; d. 03/2016 STEMI/PCI: LM nl, ost LAD 70%, mLCx 50%, pRCA 95% - mRCA 60%, dRPDA 70%, LIMA->LAD ok, o-p SVG->RPDA 100% (3.0x16 Promus DES overlapping prior stent).  . Diastolic dysfunction    a. 03/2016 Echo: EF 60-65%, no rwma, Gr1 DD, triv AI, Ao root 4m, Asc Ao 458m triv MR.  . Dizziness    a. Carotid duplex 03/2014: mild fibrous plaque, no significant stenosis.  . Foley catheter in place    "been wearing it for a couple months now" (03/26/2016)  . HTN (hypertension)   . Hyperlipidemia LDL goal <70   . Medical history non-contributory   . Non-STEMI (non-ST elevated myocardial infarction) ([facility]/2012; 03/2018   1. Ostial LAD 70% [facility] subtotal occlusion of the RCA; 2.  In-stent restenosis SVG-RCA -PTCA.  . Marland Kitchenostoperative atrial fibrillation ([facility]CPi[facility]07/2012   Post CABG, no sign recurrence  . Refusal of blood transfusions as patient is Jehovah's Witness   . 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 GeServando Snare . Sleep apnea    Not on CPAP. (03/26/2016)  . ST elevation myocardial infarction (STEMI) of inferior wall (HCSayre11/19/2017   Occluded in-stent restenosis/thrombosis of SVG-RCA --> treated with overlapping Promus DES 3.0 mm 16 mm postdilated 3.0 mm).  . [facility]; 01/2015   a. 2014 with mild right hand weakness, nonhemorrhagic per pt.;; b. - PTA-Stent L ICA 95%   . Type II diabetes mellitus (HCR[place]   Past Surgical History:  Past Surgical History:  Procedure Laterality Date  . APPENDECTOMY    . CARDIAC CATHETERIZATION  12/11/2010   Dr. AlChase Picket subsequent cath - normal LV systolic function, no renal artery stenosis, severe 2-vessel disease with subtotaled RCA prox and distal 60% lesion and complex 70% area of narrowing of ostium of LAD  . CARDIAC CATHETERIZATION N/A 03/26/2016   Procedure: Left Heart Cath and Coronary  Angiography;  Surgeon: ChNelva BushMD;  [facility];  Service: Cardiovascular;  Laterality: N/A;  . CARDIAC CATHETERIZATION N/A 03/26/2016   Procedure: Coronary Stent Intervention;  Surgeon: ChNelva BushMD;  [facility];  Service: Cardiovascular: 100% In-stent thrombosis of pros SVG-RCA (Xience DES) --> treated with PromusDES 3.0 x 18 (3.3 mm)  . CARDIAC CATHETERIZATION N/A 03/26/2016   Procedure: Bypass Graft Angiography;  Surgeon: ChNelva BushMD;  [facility];  Service: Cardiovascular;  Laterality: N/A;  . CORONARY ANGIOPLASTY WITH STENT PLACEMENT  06/18/2014   PCI to SVG-RPDA 06/18/14 (Xience Alpine DES 3.0 x 18 mm -3.35 mm),   . CORONARY ARTERY BYPASS GRAFT  12/15/2010   X2, Dr GeServando SnareLIMA to LAD, SVG to PDA;   . CORONARY BALLOON ANGIOPLASTY N/A 04/18/2018   Procedure: CORONARY BALLOON ANGIOPLASTY;  Surgeon: HaLeonie ManMD;  [facility];  Service: Cardiovascular;;; high pressure scoring and noncompliant balloon PTCA of SVG-RCA ISR ostial and proximal  .  CORONARY/GRAFT ANGIOGRAPHY N/A 08/27/2017   Procedure: CORONARY/GRAFT ANGIOGRAPHY;  Surgeon: Nelva Bush, MD;  Location: [place] [facility] LAB;  Service: Cardiovascular;; pLAD 70%, ostD1 50%.  mCx 60%, OM2 80%. pRCA 95% & mRCA 100% - rPDA 70%. LIMA-mLAD patent. SVG-rPDA 10% ISR.   Marland Kitchen CYSTOSCOPY WITH URETHRAL DILATATION    . IR ANGIO INTRA EXTRACRAN SEL COM CAROTID INNOMINATE BILAT MOD SED  02/25/2017  . IR ANGIO INTRA EXTRACRAN SEL COM CAROTID INNOMINATE BILAT MOD SED  10/19/2017   Dr. Kathee Delton: L Common Carotid - ECA & major branches widely patent. ICA ~20% distal to bulb & 50% in supraclinoid segment. LMCA-distal 1/3 MI ~905 stenosis with post-stenotic dilation into inferior division. ~50% prox Basilar A stenosis @ anterior Inf Cerebellar A. 50% R ICA  . IR ANGIO VERTEBRAL SEL SUBCLAVIAN INNOMINATE UNI L MOD SED  02/25/2017  . IR ANGIO VERTEBRAL SEL SUBCLAVIAN  INNOMINATE UNI R MOD SED  10/19/2017  . IR ANGIO VERTEBRAL SEL VERTEBRAL UNI L MOD SED  10/19/2017  . IR GENERIC HISTORICAL  01/21/2016   IR RADIOLOGIST EVAL & MGMT 01/21/2016 [facility]  . IR GENERIC HISTORICAL  02/03/2016   IR CATHETER TUBE CHANGE 02/03/2016 Marybelle Killings, MD [facility]-INTER[facility] RAD  . IR RADIOLOGIST EVAL & MGMT  11/09/2017  . LEFT HEART CATH AND CORONARY ANGIOGRAPHY N/A 04/18/2018   Procedure: LEFT HEART CATH AND CORONARY ANGIOGRAPHY;  Surgeon: Leonie Man, MD;  Location: [place] [facility] LAB;  Service: Cardiovascular;  Laterality: N/A; stable findings on last cath with exception of 75% in-stent restenosis of SVG-RCA ostial stent treated with PTCA.    Marland Kitchen LEFT HEART CATHETERIZATION WITH CORONARY ANGIOGRAM N/A 06/18/2014   Procedure: LEFT HEART CATHETERIZATION WITH CORONARY ANGIOGRAM;  Surgeon: Leonie Man, MD;  Location: [facility] CATH LAB;  Service: Cardiovascular;  -- severe disease of SVG-rPDA  . NO PAST SURGERIES    . RADIOLOGY WITH ANESTHESIA N/A 01/24/2015   Procedure: STENT ASSISTED ANGIOPLASTY (RADIOLOGY WITH ANESTHESIA);  Surgeon: Luanne Bras, MD;  Location: [place];  Service: Radiology;  Laterality: N/A;  . TONSILLECTOMY    . TRANSTHORACIC ECHOCARDIOGRAM  08/2017; 04/2018:    A) EF 60-65%. Mild LVH. No RWMA. Gr 1 DD. Mod LA dilation.;  B)  EF 35-40%.  Severe LVH.  GRII DD.  Apical anteroseptal hypokinesis.  Akinesis of the apex.    Assessment & Plan Clinical Impression:  KANO HECKMANN is an 85 year old right-handed male with history of AAA, CAD with CABG, diastolic congestive heart failure, suprapubic catheter 1 year, diabetes mellitus, CVA 2016 with mild right side weakness maintained on aspirin and Plavix,previous balloon angioplasty of left ICA petrous segment 01/25/2015 per interventional radiology, chronic renal insufficiency. Per chart review and patient, patient lives with spouse. 2 level home with ramped entrance. Independent driving managing medications. 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 He has a daughter approximate 40 miles away that works. Presented 05/24/2018 with right-sided weakness. Cranial CT scan reviewed, unremarkable for acute intracranial process. Per report, stable small chronic left frontoparietal infarction. CT angiogram of head and neck with no emergent large vessel occlusion. 60% stenosis of proximal left ICA and severe stenosis of the distal left M1 segment with preserved distal patency. MRI the brain multifocal acute ischemia within the left MCA territory as well as additional small focus of ischemia in the right frontal white matter. Carotid Dopplers with no ICA stenosis. Echocardiogram with ejection fraction of 55% no wall motion abnormalities grade 1 diastolic dysfunction.  Currently maintained on aspirin and Plavix for CVA prophylaxis. Subcutaneous Lovenox for DVT prophylaxis. Tolerating a regular diet. Interventional radiology consult to 05/26/2018 after CT angiogram head and neck reviewed current plans for outpatient cerebral angiogram question need for angioplasty/stenting 06/06/2018 7:30 AM. Therapy evaluations completed with recommendations of physical medicine rehabilitation consult. Patient was admitted for a comprehensive rehabilitation program.   Patient currently requires min with basic self-care skills secondary to muscle weakness, decreased cardiorespiratoy endurance, unbalanced muscle activation and decreased coordination, decreased attention to right and decreased standing balance.  Prior to hospitalization, patient could complete BADLs with independent .  Patient will benefit from skilled intervention to increase independence with basic self-care skills prior to discharge home. Anticipate patient will require intermittent supervision from dtr and family and follow up home health.  OT - End of Session Endurance Deficit: No OT Assessment Rehab Potential (ACUTE ONLY): Excellent OT Barriers to Discharge:  Decreased caregiver support;Medical stability OT Patient demonstrates impairments in the following area(s): Balance;Safety;Sensory;Motor OT Basic ADL's Functional Problem(s): Grooming;Bathing;Dressing;Toileting OT Advanced ADL's Functional Problem(s): Simple Meal Preparation;Light Housekeeping OT Transfers Functional Problem(s): Toilet;Tub/Shower OT Additional Impairment(s): Fuctional Use of Upper Extremity OT Plan OT Intensity: Minimum of 1-2 x/day, 45 to 90 minutes OT Frequency: 5 out of 7 days OT Duration/Estimated Length of Stay: 5-7 days OT Treatment/Interventions: Balance/vestibular training;Community reintegration;Disease mangement/prevention;Neuromuscular re-education;Patient/family education;Self Care/advanced ADL retraining;Therapeutic Exercise;UE/LE Coordination activities;Wheelchair propulsion/positioning;Discharge planning;DME/adaptive equipment instruction;Functional mobility training;Pain management;Psychosocial support;Therapeutic Activities;UE/LE Strength taining/ROM OT Self Feeding Anticipated Outcome(s): No goal OT Basic Self-Care Anticipated Outcome(s): Mod I  OT Toileting Anticipated Outcome(s): Mod I  OT Bathroom Transfers Anticipated Outcome(s): Mod I  OT Recommendation Recommendations for Other Services: Therapeutic Recreation consult Therapeutic Recreation Interventions: Pet therapy Patient destination: Home Follow Up Recommendations: Home health OT;Outpatient OT Equipment Recommended: To be determined   Skilled Therapeutic Intervention Skilled OT session completed with focus on initial evaluation, education on OT role/POC, and establishment of patient-centered goals. Pt completed bathing (at shower level), dressing (sit<stand from toilet), toileting (using standard toilet, B+B void), and grooming/shaving tasks (standing at sink) during session. All functional transfers completed at ambulatory level without device and Min A. He required mod vcs throughout ADL to  increase functional use of Rt (pt is Lt handed at baseline). L UE with decreased smoothness/accuracy of movement along with gross motor and fine motor control deficits. Also noted diminished sensation in Rt hand/digits. Steady assist-Min A overall (had pt manage his catheter also). At end of session pt returned to recliner and was left with LEs elevated, all needs within reach, and safety belt fastened.    OT Evaluation Precautions/Restrictions  Precautions Precautions: Fall Precaution Comments: Rt hemi Restrictions Weight Bearing Restrictions: No General Chart Reviewed: Yes Family/Caregiver Present: No Pain Pain Assessment Pain Scale: 0-10 Pain Score: 0-No pain Home Living/Prior Functioning Home Living Family/patient expects to be discharged to:: Private residence Living Arrangements: Spouse/significant other Available Help at Discharge: Family Type of Home: House Home Access: Ramped entrance Home Layout: Two level, Full bath on main level, Able to live on main level with bedroom/bathroom Alternate Level Stairs-Number of Steps: flight Alternate Level Stairs-Rails: Right Bathroom Shower/Tub: Tub/shower unit, Multimedia programmer: Handicapped height Bathroom Accessibility: Yes Additional Comments: Two walker accessible bathrooms on main level that have high toilets, and either tub shower or walk in shower  Lives With: Spouse, Other (Comment)(mother in Sports coach) IADL History Homemaking Responsibilities: Yes Meal Prep Responsibility: Primary Laundry Responsibility: Primary Cleaning Responsibility: Primary Bill Paying/Finance Responsibility: Primary Shopping Responsibility: Primary Child Care  Responsibility: No Homemaking Comments: 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 (he makes their meals and does their laundry) 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 Leisure and Hobbies: Nurse, mental health  Prior Function Level of Independence:  Independent with basic ADLs, Independent with gait, Independent with homemaking with ambulation  Able to [place]?: Yes Driving: Yes ADL ADL Grooming: Supervision/safety Where Assessed-Grooming: Standing at sink Upper Body Bathing: Supervision/safety Where Assessed-Upper Body Bathing: Shower Lower Body Bathing: Contact guard Where Assessed-Lower Body Bathing: Shower Upper Body Dressing: Contact guard(standing) Where Assessed-Upper Body Dressing: Other (Comment)(standing beside toilet) Lower Body Dressing: Contact guard Where Assessed-Lower Body Dressing: Other (Comment)(sit<stand from toilet) Toileting: Contact guard Where Assessed-Toileting: Glass blower/designer: Psychiatric nurse Method: Counselling psychologist: Emergency planning/management officer Transfer: Environmental education officer Method: Heritage manager: Civil engineer, contracting with back Vision Baseline Vision/History: Wears glasses Wears Glasses: At all times Patient Visual Report: No change from baseline Vision Assessment?: No apparent visual deficits;Yes(Able to read pertinent patient information on wall board and wall clock while sitting in recliner near bed) Perception  Perception: Within Functional Limits Praxis Praxis: Intact Cognition Overall Cognitive Status: No family/caregiver present to determine baseline cognitive functioning Arousal/Alertness: Awake/alert Orientation Level: Person;Place;Situation Person: Oriented Place: Oriented Situation: Oriented Year: 2020 Month: January Day of Week: Correct Memory: Impaired Memory Impairment: Decreased recall of new information Immediate Memory Recall: Sock;Blue;Bed Memory Recall: (0/3 with cuing) Awareness: Appears intact Problem Solving: Appears intact Safety/Judgment: Appears intact Comments: mild R sided inattiention Sensation Sensation Light Touch: Impaired Detail Light Touch Impaired Details: Impaired  RUE(diminished sensation hand/fingertips) Hot/Cold: Appears Intact Proprioception: Impaired by gross assessment;Impaired Detail Proprioception Impaired Details: Impaired RUE Coordination Gross Motor Movements are Fluid and Coordinated: No Fine Motor Movements are Fluid and Coordinated: No Coordination and Movement Description: Decreased R UE gross/fine motor control and accuracy/smoothness of movement Finger Nose Finger Test: Dysmetria Rt, WNL Lt Motor  Motor Motor: Hemiplegia Motor - Skilled Clinical Observations: mild strenght deficits on the R UE>LE. UE>LE ataxia  Mobility  Minimal Assistance ambulatory bathroom transfers without device Trunk/Postural Assessment  Cervical Assessment Cervical Assessment: Exceptions to WFL(forward head) Thoracic Assessment Thoracic Assessment: Exceptions to WFL(rounded shoulders. Mild scapular winging Rt>Lt) Lumbar Assessment Lumbar Assessment: Exceptions to WFL(posterior pelvic tilt) Postural Control Postural Control: Within Functional Limits  Balance Balance Balance Assessed: Yes Dynamic Sitting Balance Dynamic Sitting - Level of Assistance: 5: Stand by assistance Sitting balance - Comments: donning LB garments when sitting on toilet Static Standing Balance Static Standing - Level of Assistance: 5: Stand by assistance Dynamic Standing Balance Dynamic Standing - Level of Assistance: 4: Min assist(Perihygiene completion in shower) Extremity/Trunk Assessment RUE Assessment RUE Assessment: Exceptions to [facility] General Strength Comments: Deficits with gross and fine motor control. Decreased accuracy and smoothness of movement. Brunnstrom Stage 4 LUE Assessment LUE Assessment: Within Functional Limits     Refer to Care Plan for Long Term Goals  Recommendations for other services: Therapeutic Recreation  Pet therapy   Discharge Criteria: Patient will be discharged from OT if patient refuses treatment 3 consecutive times without medical  reason, if treatment goals not met, if there is a change in medical status, if patient makes no progress towards goals or if patient is discharged from hospital.  The above assessment, treatment plan, treatment alternatives and goals were discussed and mutually agreed upon: by patient  Skeet Simmer 05/28/2018, 12:38 PM

## 2018-05-28 NOTE — Evaluation (Signed)
Physical Therapy Assessment and Plan  Patient Details  Name: [name] MRN: [id number] Date of Birth: May 30, 1930  PT Diagnosis: Abnormal posture, Abnormality of gait and Muscle weakness Rehab Potential: Good ELOS: 4-6 days    Today's Date: 05/28/2018 PT Individual Time: 1000-1100 AND 1605-1705 PT Individual Time Calculation (min): 60 min  AND 60 min   Problem List:  Patient Active Problem List   Diagnosis Date Noted  . Left middle cerebral artery stroke ([place]) 05/27/2018  . Vitamin D deficiency 05/27/2018  . Acute CVA (cerebrovascular accident) ([place]) 05/25/2018  . Right arm weakness 05/25/2018  . Right sided weakness   . Coronary artery disease involving coronary bypass graft of native heart without angina pectoris   . History of CVA with residual deficit   . Acute blood loss anemia   . Dilated cardiomyopathy ([place]) 04/26/2018  . Fatigue 04/26/2018  . Chronic combined systolic and diastolic CHF (congestive heart failure) ([place])   . Leukocytosis 04/17/2018  . Chronic diastolic CHF (congestive heart failure) ([place]) 04/17/2018  . History of suprapubic catheter 04/17/2018  . Chronic anemia 04/17/2018  . Chest pain 01/28/2018  . Pseudomonas urinary tract infection   . Hydrocele, right   . Olecranon bursitis of left elbow   . CKD (chronic kidney disease) stage 3, GFR 30-59 ml/min (HCC)   . Suprapubic catheter ([place])   . History of urethral stricture   . Hemispheric carotid artery syndrome 12/22/2016  . AKI (acute kidney injury) ([name]) 03/28/2016  . S/P angioplasty with stent 03/26/16 to VG to PDA for in-stent restenosis with DES. 03/28/2016  . History of ST elevation myocardial infarction (STEMI) 03/28/2016  . Carpal tunnel syndrome of right wrist 06/24/2015  . History of stroke 03/21/2015  . Diabetes mellitus treated with oral medication ([place]) 03/21/2015  . Stenosis of left carotid artery   . Essential hypertension   . Hyperlipidemia LDL goal <70   . Cerebral infarction due to  stenosis of left middle cerebral artery ([place]) 01/22/2015  . CAD S/P percutaneous coronary angioplasty 09/19/2014  . Coronary artery disease involving coronary bypass graft of native heart with angina pectoris ([place]) 09/19/2014  . Shortness of breath 09/19/2014  . Polypharmacy 09/19/2014  . Abdominal aortic aneurysm ([place])   . Stroke ([place])   . Hyperlipidemia associated with type 2 diabetes mellitus ([place])   . Type II diabetes mellitus with complication ([place])   . Coronary artery disease involving native coronary artery of native heart with angina pectoris ([place])   . Abnormal TSH 06/19/2014  . Obesity 06/19/2014  . STEMI (ST elevation myocardial infarction) ([place]) 06/18/2014  . NSTEMI (non-ST elevated myocardial infarction) ([place]) 06/17/2014  . Bilateral lower extremity edema 03/30/2014  . Dizziness 02/21/2014  . Bradycardia 08/31/2013  . Postoperative atrial fibrillation (HCC)     Class: Temporary  . Diabetes mellitus type 2 in obese ([place])   . Apnea, sleep   . Balanitis xerotica obliterans   . History of: Non-STEMI (non-ST elevated myocardial infarction) 11/16/2010    Class: History of  . S/P CABG x 2 11/16/2010    Past Medical History:  Past Medical History:  Diagnosis Date  . Abdominal aortic aneurysm ([place])    a. Aortic duplex 06/2014: mild aneurysmal dilatation of proximal abdominal aorta measuring 3.4x3.4cm. No sig change from 2012. F/u due 06/2016;   . AKI (acute kidney injury) ([place]) 03/28/2016  . Arthritis    "right knee; never bothered me" (03/26/2016)  . 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 CAD in native artery    a. NSTEMI 11/2010 - CABG x2(LIMA to LAD, SVG to PDA). b. NEG Lexi MV 10/24/13, EF 53%, no perfusion abnormality, septal and apical HK noted. c. NSTEMI 05/2014 - s/p DES to SVG-RPDA 06/18/14 (Xience Alpine DES 3.0 x 18 mm -3.35 mm), EF 60-65; d. 03/2016 STEMI/PCI: LM nl, ost LAD 70%, mLCx 50%, pRCA 95% - mRCA 60%, dRPDA 70%, LIMA->LAD ok,  o-p SVG->RPDA 100% (3.0x16 Promus DES overlapping prior stent).  . Diastolic dysfunction    a. 03/2016 Echo: EF 60-65%, no rwma, Gr1 DD, triv AI, Ao root 22m, Asc Ao 422m triv MR.  . Dizziness    a. Carotid duplex 03/2014: mild fibrous plaque, no significant stenosis.  . Foley catheter in place    "been wearing it for a couple months now" (03/26/2016)  . HTN (hypertension)   . Hyperlipidemia LDL goal <70   . Medical history non-contributory   . Non-STEMI (non-ST elevated myocardial infarction) ([place]/2012; 03/2018   1. Ostial LAD 70% (to close to [facility] PCI), subtotal occlusion of the RCA; 2.  In-stent restenosis SVG-RCA -PTCA.  . Marland Kitchenostoperative atrial fibrillation (HCH[facility]07/2012   Post CABG, no sign recurrence  . Refusal of blood transfusions as patient is Jehovah's Witness   . 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 GeServando Snare . Sleep apnea    Not on CPAP. (03/26/2016)  . ST elevation myocardial infarction (STEMI) of inferior wall (HCKiana11/19/2017   Occluded in-stent restenosis/thrombosis of SVG-RCA --> treated with overlapping Promus DES 3.0 mm 16 mm postdilated 3.0 mm).  . [facility]; 01/2015   a. 2014 with mild right hand weakness, nonhemorrhagic per pt.;; b. - PTA-Stent L ICA 95%   . Type II diabetes mellitus (HCS[place]   Past Surgical History:  Past Surgical History:  Procedure Laterality Date  . APPENDECTOMY    . CARDIAC CATHETERIZATION  12/11/2010   Dr. AlChase Picket subsequent cath - normal LV systolic function, no renal artery stenosis, severe 2-vessel disease with subtotaled RCA prox and distal 60% lesion and complex 70% area of narrowing of ostium of LAD  . CARDIAC CATHETERIZATION N/A 03/26/2016   Procedure: Left Heart Cath and Coronary Angiography;  Surgeon: ChNelva BushMD;  [facility];  Service: Cardiovascular;  Laterality: N/A;  . CARDIAC CATHETERIZATION N/A 03/26/2016   Procedure: Coronary Stent Intervention;  Surgeon: ChNelva BushMD;  [place];  Service: Cardiovascular: 100% In-stent thrombosis of pros SVG-RCA (Xience DES) --> treated with PromusDES 3.0 x 18 (3.3 mm)  . CARDIAC CATHETERIZATION N/A 03/26/2016   Procedure: Bypass Graft Angiography;  Surgeon: ChNelva BushMD;  [place];  Service: Cardiovascular;  Laterality: N/A;  . CORONARY ANGIOPLASTY WITH STENT PLACEMENT  06/18/2014   PCI to SVG-RPDA 06/18/14 (Xience Alpine DES 3.0 x 18 mm -3.35 mm),   . CORONARY ARTERY BYPASS GRAFT  12/15/2010   X2, Dr GeServando SnareLIMA to LAD, SVG to PDA;   . CORONARY BALLOON ANGIOPLASTY N/A 04/18/2018   Procedure: CORONARY BALLOON ANGIOPLASTY;  Surgeon: HaLeonie ManMD;  [facility]: [facility];  Service: Cardiovascular;;; high pressure scoring and noncompliant balloon PTCA of SVG-RCA ISR ostial and proximal  . CORONARY/GRAFT ANGIOGRAPHY N/A 08/27/2017   Procedure: CORONARY/GRAFT ANGIOGRAPHY;  Surgeon: EnNelva BushMD;  [facility];  Service: Cardiovascular;; pLAD 70%, ostD1 50%.  mCx  60%, OM2 80%. pRCA 95% & mRCA 100% - rPDA 70%. LIMA-mLAD patent. SVG-rPDA 10% ISR.   Marland Kitchen CYSTOSCOPY WITH URETHRAL DILATATION    . IR ANGIO INTRA EXTRACRAN SEL COM CAROTID INNOMINATE BILAT MOD SED  02/25/2017  . IR ANGIO INTRA EXTRACRAN SEL COM CAROTID INNOMINATE BILAT MOD SED  10/19/2017   Dr. Kathee Delton: L Common Carotid - ECA & major branches widely patent. ICA ~20% distal to bulb & 50% in supraclinoid segment. LMCA-distal 1/3 MI ~905 stenosis with post-stenotic dilation into inferior division. ~50% prox Basilar A stenosis @ anterior Inf Cerebellar A. 50% R ICA  . IR ANGIO VERTEBRAL SEL SUBCLAVIAN INNOMINATE UNI L MOD SED  02/25/2017  . IR ANGIO VERTEBRAL SEL SUBCLAVIAN INNOMINATE UNI R MOD SED  10/19/2017  . IR ANGIO VERTEBRAL SEL VERTEBRAL UNI L MOD SED  10/19/2017  . IR GENERIC HISTORICAL  01/21/2016   IR RADIOLOGIST EVAL & MGMT 01/21/2016 [facility]  . IR GENERIC HISTORICAL  02/03/2016    IR CATHETER TUBE CHANGE 02/03/2016 Marybelle Killings, MD [facility]INTER[facility] RAD  . IR RADIOLOGIST EVAL & MGMT  11/09/2017  . LEFT HEART CATH AND CORONARY ANGIOGRAPHY N/A 04/18/2018   Procedure: LEFT HEART CATH AND CORONARY ANGIOGRAPHY;  Surgeon: Leonie Man, MD;  Location: [place] [facility] LAB;  Service: Cardiovascular;  Laterality: N/A; stable findings on last cath with exception of 75% in-stent restenosis of SVG-RCA ostial stent treated with PTCA.    Marland Kitchen LEFT HEART CATHETERIZATION WITH CORONARY ANGIOGRAM N/A 06/18/2014   Procedure: LEFT HEART CATHETERIZATION WITH CORONARY ANGIOGRAM;  Surgeon: Leonie Man, MD;  Location: [facility] CATH LAB;  Service: Cardiovascular;  -- severe disease of SVG-rPDA  . NO PAST SURGERIES    . RADIOLOGY WITH ANESTHESIA N/A 01/24/2015   Procedure: STENT ASSISTED ANGIOPLASTY (RADIOLOGY WITH ANESTHESIA);  Surgeon: Luanne Bras, MD;  Location: [facility];  Service: Radiology;  Laterality: N/A;  . TONSILLECTOMY    . TRANSTHORACIC ECHOCARDIOGRAM  08/2017; 04/2018:    A) EF 60-65%. Mild LVH. No RWMA. Gr 1 DD. Mod LA dilation.;  B)  EF 35-40%.  Severe LVH.  GRII DD.  Apical anteroseptal hypokinesis.  Akinesis of the apex.    Assessment & Plan Clinical Impression: Patient is a 85 year old right-handed male with history of AAA, CAD with CABG, diastolic congestive heart failure, suprapubic catheter 1 year, diabetes mellitus, CVA 2016 with mild right side weakness maintained on aspirin and Plavix,previous balloon angioplasty of left ICA petrous segment 01/25/2015 per interventional radiology, chronic renal insufficiency. Per chart review and patient, patient lives with spouse. 2 level home with ramped entrance. Independent driving managing medications. 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 He has a daughter approximate 40 miles away that works. Presented 05/24/2018 with right-sided weakness. Cranial CT scan reviewed, unremarkable for acute intracranial process. Per  report, stable small chronic left frontoparietal infarction. CT angiogram of head and neck with no emergent large vessel occlusion. 60% stenosis of proximal left ICA and severe stenosis of the distal left M1 segment with preserved distal patency. MRI the brain multifocal acute ischemia within the left MCA territory as well as additional small focus of ischemia in the right frontal white matter. Carotid Dopplers with no ICA stenosis. Echocardiogram with ejection fraction of 55% no wall motion abnormalities grade 1 diastolic dysfunction. Currently maintained on aspirin and Plavix for CVA prophylaxis. Subcutaneous Lovenox for DVT prophylaxis. Tolerating a regular diet. Interventional radiology consult to 05/26/2018 after CT angiogram head and neck reviewed current plans  for outpatient cerebral angiogram question need for angioplasty/stenting 06/06/2018 7:30 AM. Therapy evaluations completed with recommendations of physical medicine rehabilitation consult.   Patient transferred to CIR on 05/27/2018 .   Patient currently requires min with mobility secondary to muscle weakness, decreased cardiorespiratoy endurance, unbalanced muscle activation and decreased coordination and decreased standing balance, decreased postural control, hemiplegia and decreased balance strategies.  Prior to hospitalization, patient was independent  with mobility and lived with Spouse, Other (Comment)(mother-in-law) in a House home.  Home access is  Ramped entrance.  Patient will benefit from skilled PT intervention to maximize safe functional mobility, minimize fall risk and decrease caregiver burden for planned discharge home with intermittent assist.  Anticipate patient will benefit from follow up OP at discharge.  PT - End of Session Activity Tolerance: Tolerates 10 - 20 min activity with multiple rests Endurance Deficit: Yes PT Assessment Rehab Potential (ACUTE/IP ONLY): Good PT Barriers to Discharge: Decreased caregiver  support;Inaccessible home environment;Home environment access/layout;Wound Care;Incontinence PT Patient demonstrates impairments in the following area(s): Balance;Endurance;Motor;Perception;Safety;Sensory;Skin Integrity PT Transfers Functional Problem(s): Bed Mobility;Bed to Chair;Car;Furniture;Floor PT Locomotion Functional Problem(s): Ambulation;Wheelchair Mobility PT Plan PT Intensity: Minimum of 1-2 x/day ,45 to 90 minutes PT Frequency: 5 out of 7 days PT Duration Estimated Length of Stay: 4-6 days  PT Treatment/Interventions: Ambulation/gait training;Balance/vestibular training;Community reintegration;Discharge planning;Disease management/prevention;DME/adaptive equipment instruction;Functional electrical stimulation;Functional mobility training;Neuromuscular re-education;Pain management;Patient/family education;Psychosocial support;Skin care/wound management;Splinting/orthotics;Stair training;Therapeutic Activities;Therapeutic Exercise;UE/LE Strength taining/ROM;UE/LE Coordination activities;Visual/perceptual remediation/compensation;Wheelchair propulsion/positioning PT Transfers Anticipated Outcome(s): Mod I with LRAD  PT Locomotion Anticipated Outcome(s): Mod I ambulatory with LRAD  PT Recommendation Recommendations for Other Services: Therapeutic Recreation consult Therapeutic Recreation Interventions: Outing/community reintergration Follow Up Recommendations: Outpatient PT;Home health PT Patient destination: Home Equipment Recommended: To be determined  Skilled Therapeutic Intervention Session 1 Pt received sitting in WC and agreeable to PT. PT instructed patient in PT Evaluation and initiated treatment intervention; see below for results. PT educated patient in [place], rehab potential, rehab goals, and discharge recommendations. PT instructed pt in DGI, 17/24, ( <19 indicates increased fall risk;see below). Patient returned to room and left sitting in [facility] with call bell in reach and all  needs met. .   Session 2.  Pt received sitting in WC and agreeable to PT. PT instructed pt in gait training through hall of hospital 2x 166f with distant supervision assist; mild RLE and trunk ataxia noted Nustep reciprocal movement and endurance training 3 min x 2 level 6>7 with min cues for symmetrical movement and proper speed to prevent fatigue. Dynamic balance training with visual feed back from Wii fit, penguin slide x 2 and tilt table x 2; Supervision assist overall with min cues for improved use of ankle strategy to preform L weight shifting. Patient demonstrates increased fall risk as noted by score of   42/56 on Berg Balance Scale.  (<36= high risk for falls, close to 100%; 37-45 significant >80%; 46-51 moderate >50%; 52-55 lower >25%)  Patient returned to room and [facility] and all needs met.        PT Evaluation Precautions/Restrictions Precautions Precautions: Fall Restrictions Weight Bearing Restrictions: No Pain Session 1 Pain Assessment Pain Scale: 0-10 Pain Score: 0-No pain  Session 2.  Pain Scale: 0-10 Pain Score: 0-No pain  Home Living/Prior Functioning Home Living Available Help at Discharge: Family Type of Home: House Home Access: Ramped entrance Home Layout: Two level Alternate Level Stairs-Number of Steps: flight Alternate Level Stairs-Rails: Right Bathroom Shower/Tub: Tub/shower unit;Walk-in shower  Bathroom Toilet: Handicapped height Bathroom Accessibility: Yes  Lives With: Spouse;Other (Comment)(mother-in-law) Prior Function Level of Independence: Independent with basic ADLs;Independent with gait  Able to Take Stairs?: Yes Driving: Yes Comments: independent, driving, and managing medication; 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  Vision/Perception  Perception Perception: Within Functional Limits Praxis Praxis: Intact  Cognition Overall Cognitive Status: Impaired/Different from baseline Orientation Level:  Oriented X4 Memory: Appears intact Awareness: Impaired Problem Solving: Appears intact Safety/Judgment: Impaired Comments: mild R sided inattiention Sensation Sensation Light Touch: Appears Intact Proprioception: Impaired by gross assessment(mild R sided proprioceptive deficits UE >LE ) Coordination Gross Motor Movements are Fluid and Coordinated: No Fine Motor Movements are Fluid and Coordinated: No Coordination and Movement Description: ataxic RUE and RLE Finger Nose Finger Test: ataxic on the R  Heel Shin Test: Ataxic on the R  Motor  Motor Motor: Hemiplegia;Ataxia Motor - Skilled Clinical Observations: mild strenght deficits on the R UE>LE. UE>LE ataxia   Mobility Bed Mobility Bed Mobility: Rolling Right;Rolling Left;Supine to Sit;Sit to Supine Rolling Right: Supervision/verbal cueing Rolling Left: Supervision/Verbal cueing Supine to Sit: Supervision/Verbal cueing Sit to Supine: Supervision/Verbal cueing Transfers Transfers: Sit to Stand;Stand to Sit;Stand Pivot Transfers Sit to Stand: Supervision/Verbal cueing Stand to Sit: Supervision/Verbal cueing Stand Pivot Transfers: Contact Guard/Touching assist  Car transfer: Contact guard assist, min cues for safety.  Locomotion  Gait Ambulation: Yes Gait Assistance: Contact Guard/Touching assist Gait Distance (Feet): 150 Feet Assistive device: None Gait Gait: Yes Gait Pattern: Impaired Gait Pattern: Ataxic;Antalgic Gait velocity: mild R side ataxia and reports pain in the R knee.  Stairs / Additional Locomotion Stairs: Yes Stairs Assistance: Contact Guard/Touching assist Stair Management Technique: One rail Right Number of Stairs: 12 Height of Stairs: 6 Wheelchair Mobility Wheelchair Mobility: Yes Wheelchair Assistance: Minimal assistance - Patient >75% Wheelchair Propulsion: Both upper extremities Wheelchair Parts Management: Needs assistance Distance: 140f   Trunk/Postural Assessment  Cervical  Assessment Cervical Assessment: Exceptions to [facility] HospitalThoracic Assessment Thoracic Assessment: Exceptions to WPhys[facility] Surgical Center LLCLumbar Assessment Lumbar Assessment: Exceptions to WLeg[facility] Medical CenterPostural Control Postural Control: Deficits on evaluation(mild trunkal ataxia)  Balance Standardized Balance Assessment Standardized Balance Assessment: Berg Balance Test;Dynamic Gait Index Dynamic Gait Index Level Surface: Mild Impairment Change in Gait Speed: Mild Impairment Gait with Horizontal Head Turns: Normal Gait with Vertical Head Turns: Mild Impairment Gait and Pivot Turn: Mild Impairment Step Over Obstacle: Moderate Impairment Step Around Obstacles: Mild Impairment Steps: Moderate Impairment Total Score: 15 Dynamic Sitting Balance Dynamic Sitting - Level of Assistance: 6: Modified independent (Device/Increase time) Static Standing Balance Static Standing - Level of Assistance: 5: Stand by assistance Dynamic Standing Balance Dynamic Standing - Level of Assistance: 4: Min assist Extremity Assessment      RLE Assessment RLE Assessment: Within Functional Limits LLE Assessment LLE Assessment: Within Functional Limits    Refer to Care Plan for Long Term Goals  Recommendations for other services: Therapeutic Recreation  Outing/community reintegration  Discharge Criteria: Patient will be discharged from PT if patient refuses treatment 3 consecutive times without medical reason, if treatment goals not met, if there is a change in medical status, if patient makes no progress towards goals or if patient is discharged from hospital.  The above assessment, treatment plan, treatment alternatives and goals were discussed and mutually agreed upon: by patient  ALorie Phenix1/03/2019, 11:02 AM

## 2018-05-28 NOTE — Progress Notes (Signed)
[place] PHYSICAL MEDICINE & REHABILITATION PROGRESS NOTE  Subjective/Complaints: Patient seen laying in bed this morning.  He states he slept well overnight.  He states he is ready to begin therapies.  ROS: Denies CP, shortness of breath, nausea, vomiting, diarrhea.  Objective: Vital Signs: Blood pressure (!) 161/76, pulse (!) 51, temperature 98.1 F (36.7 C), temperature source Oral, resp. rate 18, height 6\' 1"  (1.854 m), weight 95.4 kg, SpO2 100 %. No results found. Recent Labs    05/26/18 0538 05/27/18 0647  WBC 5.6 5.7  HGB 7.9* 7.9*  HCT 24.7* 24.9*  PLT 227 268   Recent Labs    05/26/18 0538 05/27/18 0647  NA 138 138  K 3.6 3.8  CL 107 107  CO2 23 25  GLUCOSE 109* 117*  BUN 19 19  CREATININE 1.47* 1.45*  CALCIUM 8.4* 8.5*    Physical Exam: BP (!) 161/76 (BP Location: Left Arm)   Pulse (!) 51   Temp 98.1 F (36.7 C) (Oral)   Resp 18   Ht 6\' 1"  (1.854 m)   Wt 95.4 kg   SpO2 100%   BMI 27.75 kg/m  Constitutional: No distress . Vital signs reviewed. HENT: Normocephalic.  Atraumatic. Eyes: EOMI. No discharge. Cardiovascular: + Bradycardia.  Regular rhythm. No JVD. Respiratory: CTA Bilaterally. Normal effort. GI: BS +. Non-distended. Musc: No edema or tenderness in extremities. Genitourinary: Suprapubic cath in place Neurological: Alert Follows full commands.  Fair awareness of deficits.  Motor: LUE/LLE 4+/5.  RUE/RLE: 4/5 proximal to distal Skin: Warm and dry.  Intact.  Assessment/Plan: 1. Functional deficits secondary to left MCA infarct which require 3+ hours per day of interdisciplinary therapy in a comprehensive inpatient rehab setting.  Physiatrist is providing close team supervision and 24 hour management of active medical problems listed below.  Physiatrist and rehab team continue to assess barriers to discharge/monitor patient progress toward functional and medical goals  Care Tool:  Bathing              Bathing assist        Upper Body Dressing/Undressing Upper body dressing        Upper body assist      Lower Body Dressing/Undressing Lower body dressing            Lower body assist       Toileting Toileting    Toileting assist Assist for toileting: Minimal Assistance - Patient > 75%     Transfers Chair/bed transfer  Transfers assist     Chair/bed transfer assist level: Contact Guard/Touching assist     Locomotion Ambulation   Ambulation assist              Walk 10 feet activity   Assist           Walk 50 feet activity   Assist           Walk 150 feet activity   Assist           Walk 10 feet on uneven surface  activity   Assist           Wheelchair     Assist               Wheelchair 50 feet with 2 turns activity    Assist            Wheelchair 150 feet activity     Assist            Medical Problem List and  Plan: 1.Left side weaknesssecondary to left MCA infarction, high-grade left MCA M1 stenosis.Plan outpatient cerebral angiogram question angioplasty stenting 06/06/2018 at 7:30 AM per interventional radiology  Begin CIR  Notes reviewed-stroke, images reviewed-right MCA infarct, labs reviewed 2. DVT Prophylaxis/Anticoagulation: Subcutaneous Lovenox. Monitor for any bleeding episodes 3. Pain Management:Tylenol as needed 4. Mood:Provide emotional support 5. Neuropsych: This patientiscapable of making decisions on hisown behalf. 6. Skin/Wound Care:Routine skin checks 7. Fluids/Electrolytes/Nutrition:Routine ins and outs 8. History of CAD with CABG. No chest pain or shortness of breath 9. Diastolic congestive heart failure. Monitor for signs of fluid overload. Patient on Lasix 20 mg daily prior to admission. Resume as needed Filed Weights   05/27/18 1139  Weight: 95.4 kg  10. Diabetes mellitus peripheral neuropathy. Hemoglobin A1c 8.2. SSI. Patient Amaryl 4 mg twice a day, Glucophage  1000 mg twice a day prior to admission. Resume as needed  Monitor with increased mobility 11. Hypertension. Permissive hypertension. Patient on Norvasc 10 mg daily, Coreg 3.125 mg every a.m. and 6.25 mg every p.m.,Imdur 60 mg twice a day, hydralazine 100 mg twice a dayprior to admission and resume as needed  Monitor with increased mobility 12. Chronic renal insufficiency. Baseline creatinine 1.41.  Creatinine 1.45 on 1/10  Labs ordered for Monday  Continue to monitor 13. Hyperlipidemia. Crestor 14. Chronic anemia. Continue iron supplement  Hemoglobin 7.9 on 1/10  Labs ordered for Monday  Continue to monitor  LOS: 1 days A FACE TO FACE EVALUATION WAS PERFORMED  Ankit Lorie Phenix 05/28/2018, 11:23 AM

## 2018-05-29 ENCOUNTER — Inpatient Hospital Stay (HOSPITAL_COMMUNITY): Payer: Medicare Other | Admitting: Occupational Therapy

## 2018-05-29 DIAGNOSIS — I169 Hypertensive crisis, unspecified: Secondary | ICD-10-CM

## 2018-05-29 LAB — GLUCOSE, CAPILLARY
Glucose-Capillary: 89 mg/dL (ref 70–99)
Glucose-Capillary: 185 mg/dL — ABNORMAL HIGH (ref 70–99)
Glucose-Capillary: 117 mg/dL — ABNORMAL HIGH (ref 70–99)
Glucose-Capillary: 158 mg/dL — ABNORMAL HIGH (ref 70–99)

## 2018-05-29 MED ORDER — AMLODIPINE BESYLATE 5 MG PO TABS
5.0000 mg | ORAL_TABLET | Freq: Every day | ORAL | Status: DC
  Administered 2018-05-29 – 2018-05-31 (×3): 5 mg via ORAL
  Filled 2018-05-29 (×3): qty 1

## 2018-05-29 NOTE — Progress Notes (Signed)
[facility] PHYSICAL MEDICINE & REHABILITATION PROGRESS NOTE  Subjective/Complaints: Patient seen laying in bed this morning.  He states he slept well overnight.  He states that a good first day of therapies yesterday.  ROS: Denies CP, shortness of breath, nausea, vomiting, diarrhea.  Objective: Vital Signs: Blood pressure (!) 149/67, pulse (!) 51, temperature 97.7 F (36.5 C), temperature source Oral, resp. rate 19, height 6\' 1"  (1.854 m), weight 95.4 kg, SpO2 95 %. No results found. Recent Labs    05/27/18 0647  WBC 5.7  HGB 7.9*  HCT 24.9*  PLT 268   Recent Labs    05/27/18 0647  NA 138  K 3.8  CL 107  CO2 25  GLUCOSE 117*  BUN 19  CREATININE 1.45*  CALCIUM 8.5*    Physical Exam: BP (!) 149/67 (BP Location: Left Arm)   Pulse (!) 51   Temp 97.7 F (36.5 C) (Oral)   Resp 19   Ht 6\' 1"  (1.854 m)   Wt 95.4 kg   SpO2 95%   BMI 27.75 kg/m  Constitutional: No distress . Vital signs reviewed. HENT: Normocephalic.  Atraumatic. Eyes: EOMI. No discharge. Cardiovascular: + Bradycardia.  Regular rhythm. No JVD. Respiratory: CTA bilaterally.  Normal effort. GI: BS +. Non-distended. Musc: No edema or tenderness in extremities. Genitourinary: Suprapubic cath in place Neurological: Alert Follows full commands.  Fair awareness of deficits.  Motor: LUE/LLE 4+/5.  RUE/RLE: 4/5 proximal to distal, with decreased [place] Skin: Warm and dry.  Intact.  Assessment/Plan: 1. Functional deficits secondary to left MCA infarct which require 3+ hours per day of interdisciplinary therapy in a comprehensive inpatient rehab setting.  Physiatrist is providing close team supervision and 24 hour management of active medical problems listed below.  Physiatrist and rehab team continue to assess barriers to discharge/monitor patient progress toward functional and medical goals  Care Tool:  Bathing    Body parts bathed by patient: Right arm, Left arm, Chest, Abdomen, Front perineal area,  Buttocks, Right upper leg, Left upper leg, Right lower leg, Left lower leg, Face         Bathing assist Assist Level: Contact Guard/Touching assist     Upper Body Dressing/Undressing Upper body dressing   What is the patient wearing?: Pull over shirt(standing)    Upper body assist Assist Level: Contact Guard/Touching assist    Lower Body Dressing/Undressing Lower body dressing      What is the patient wearing?: Underwear/pull up, Pants     Lower body assist Assist for lower body dressing: Contact Guard/Touching assist     Toileting Toileting    Toileting assist Assist for toileting: Contact Guard/Touching assist     Transfers Chair/bed transfer  Transfers assist     Chair/bed transfer assist level: Contact Guard/Touching assist     Locomotion Ambulation   Ambulation assist      Assist level: Contact Guard/Touching assist   Max distance: 230ft    Walk 10 feet activity   Assist     Assist level: Contact Guard/Touching assist     Walk 50 feet activity   Assist    Assist level: Contact Guard/Touching assist      Walk 150 feet activity   Assist    Assist level: Contact Guard/Touching assist      Walk 10 feet on uneven surface  activity   Assist     Assist level: Contact Guard/Touching assist     Wheelchair     Assist Will patient use wheelchair at discharge?:  No   Wheelchair activity did not occur: N/A         Wheelchair 50 feet with 2 turns activity    Assist    Wheelchair 50 feet with 2 turns activity did not occur: N/A       Wheelchair 150 feet activity     Assist Wheelchair 150 feet activity did not occur: N/A          Medical Problem List and Plan: 1.Left side weaknesssecondary to left MCA infarction, high-grade left MCA M1 stenosis.Plan outpatient cerebral angiogram question angioplasty stenting 06/06/2018 at 7:30 AM per interventional radiology  Continue CIR 2. DVT  Prophylaxis/Anticoagulation: Subcutaneous Lovenox. Monitor for any bleeding episodes 3. Pain Management:Tylenol as needed 4. Mood:Provide emotional support 5. Neuropsych: This patientiscapable of making decisions on hisown behalf. 6. Skin/Wound Care:Routine skin checks 7. Fluids/Electrolytes/Nutrition:Routine ins and outs 8. History of CAD with CABG. No chest pain or shortness of breath 9. Diastolic congestive heart failure. Monitor for signs of fluid overload. Patient on Lasix 20 mg daily prior to admission. Resume as needed Filed Weights   05/27/18 1139  Weight: 95.4 kg  10. Diabetes mellitus peripheral neuropathy. Hemoglobin A1c 8.2. SSI. Patient Amaryl 4 mg twice a day, Glucophage 1000 mg twice a day prior to admission. Resume as needed  Monitor with increased mobility 11. Hypertension. Permissive hypertension. Patient on Norvasc 10 mg daily, Coreg 3.125 mg every a.m. and 6.25 mg every p.m.,Imdur 60 mg twice a day, hydralazine 100 mg twice a dayprior to admission and resume as needed  Norvasc 5 started on 1/12  Elevated with hypertensive crisis overnight, will begin to resume medications  Monitor with increased mobility 12. Chronic renal insufficiency. Baseline creatinine 1.41.  Creatinine 1.45 on 1/10  Labs ordered for tomorrow  Continue to monitor 13. Hyperlipidemia. Crestor 14. Chronic anemia. Continue iron supplement  Hemoglobin 7.9 on 1/10  Labs ordered for tomorrow  Continue to monitor  LOS: 2 days A FACE TO FACE EVALUATION WAS PERFORMED  Tatiana Courter Lorie Phenix 05/29/2018, 8:14 AM

## 2018-05-29 NOTE — Progress Notes (Signed)
Inpatient Rehabilitation  Patient information reviewed and entered into eRehab system by Lounell Schumacher M. Lucky Alverson, M.A., CCC/SLP, PPS Coordinator.  Information including medical coding, functional ability and quality indicators will be reviewed and updated through discharge.    Per nursing patient was given "Data Collection Information Summary" for Patients in Inpatient Rehabilitation Facilities with attached "Privacy Act Statement-Health Care Records" upon admission.   

## 2018-05-29 NOTE — Progress Notes (Signed)
Occupational Therapy Session Note  Patient Details  Name: [name] MRN: [id number] Date of Birth: 1930-11-13  Today's Date: 05/29/2018 OT Individual Time: 1419-1535 OT Individual Time Calculation (min): 76 min   Short Term Goals: Week 1:  OT Short Term Goal 1 (Week 1): STGs=LTGs due to ELOS  Skilled Therapeutic Interventions/Progress Updates:    Pt greeted in recliner with dtr Jeani Hawking present. ADL needs met and amenable to tx. Tx focus placed on IADL retraining, balance, Rt NMR, and functional ambulation without device. Pt ambulated to therapy apartment with close supervision assist. Pt participated in bedmaking, vacuuming, dishwashing, countertop washing, simulated mopping, and simple drink prep (for dtr). Throughout tx, pt required min vcs for safety, however per dtr, pts functional abilities seem at baseline (including R UE due to CVA hx). Pt able to stoop to floor to retrieve trash (using Rt hand). Also able to lower bucket filled halfway with water to floor without LOB, and then lift bucket to sink again (to simulate mop cleanup at home). Per dtr and pt, he always wears sneakers in home to decrease fall risk during functional activity. They also have minimal clutter due to pts neatness. At end of session pt able to pathfind his way back to room without cues and supervision. Pt left with dtr and all needs within reach.       Therapy Documentation Precautions:  Precautions Precautions: Fall Precaution Comments: Rt hemi Restrictions Weight Bearing Restrictions: No Vital Signs: Therapy Vitals Temp: 97.7 F (36.5 C) Pulse Rate: (!) 52 Resp: 16 BP: (!) 166/74 Patient Position (if appropriate): Sitting Oxygen Therapy SpO2: 98 % O2 Device: Room Air O2 Flow Rate (L/min): 97.5 L/min Pain: No c/o pain during tx    ADL: ADL Grooming: Supervision/safety Where Assessed-Grooming: Standing at sink Upper Body Bathing: Supervision/safety Where Assessed-Upper Body Bathing: Shower Lower  Body Bathing: Contact guard Where Assessed-Lower Body Bathing: Shower Upper Body Dressing: Contact guard(standing) Where Assessed-Upper Body Dressing: Other (Comment)(standing beside toilet) Lower Body Dressing: Contact guard Where Assessed-Lower Body Dressing: Other (Comment)(sit<stand from toilet) Toileting: Contact guard Where Assessed-Toileting: Glass blower/designer: Psychiatric nurse Method: Counselling psychologist: Energy manager: Environmental education officer Method: Heritage manager: Shower seat with back      Therapy/Group: Individual Therapy  Mailee Klaas A Jamareon Shimel 05/29/2018, 4:04 PM

## 2018-05-30 ENCOUNTER — Inpatient Hospital Stay (HOSPITAL_COMMUNITY): Payer: Medicare Other | Admitting: Occupational Therapy

## 2018-05-30 ENCOUNTER — Inpatient Hospital Stay (HOSPITAL_COMMUNITY): Payer: Medicare Other | Admitting: Physical Therapy

## 2018-05-30 LAB — GLUCOSE, CAPILLARY
Glucose-Capillary: 101 mg/dL — ABNORMAL HIGH (ref 70–99)
Glucose-Capillary: 163 mg/dL — ABNORMAL HIGH (ref 70–99)
Glucose-Capillary: 132 mg/dL — ABNORMAL HIGH (ref 70–99)
Glucose-Capillary: 160 mg/dL — ABNORMAL HIGH (ref 70–99)

## 2018-05-30 LAB — COMPREHENSIVE METABOLIC PANEL
ALT: 9 U/L (ref 0–44)
AST: 12 U/L — ABNORMAL LOW (ref 15–41)
Albumin: 3.1 g/dL — ABNORMAL LOW (ref 3.5–5.0)
Alkaline Phosphatase: 51 U/L (ref 38–126)
Anion gap: 7 (ref 5–15)
BUN: 24 mg/dL — ABNORMAL HIGH (ref 8–23)
CO2: 24 mmol/L (ref 22–32)
Calcium: 8.3 mg/dL — ABNORMAL LOW (ref 8.9–10.3)
Chloride: 107 mmol/L (ref 98–111)
Creatinine, Ser: 1.43 mg/dL — ABNORMAL HIGH (ref 0.61–1.24)
GFR calc Af Amer: 51 mL/min — ABNORMAL LOW (ref >60)
GFR calc non Af Amer: 44 mL/min — ABNORMAL LOW (ref >60)
Glucose, Bld: 111 mg/dL — ABNORMAL HIGH (ref 70–99)
POTASSIUM: 3.8 mmol/L (ref 3.5–5.1)
Sodium: 138 mmol/L (ref 135–145)
Total Bilirubin: 0.6 mg/dL (ref 0.3–1.2)
Total Protein: 5.7 g/dL — ABNORMAL LOW (ref 6.5–8.1)

## 2018-05-30 LAB — CBC WITH DIFFERENTIAL/PLATELET
Abs Immature Granulocytes: 0.1 10*3/uL — ABNORMAL HIGH (ref 0.00–0.07)
Basophils Absolute: 0 10*3/uL (ref 0.0–0.1)
Basophils Relative: 1 %
Eosinophils Absolute: 0.1 10*3/uL (ref 0.0–0.5)
Eosinophils Relative: 3 %
HCT: 23.7 % — ABNORMAL LOW (ref 39.0–52.0)
Hemoglobin: 7.4 g/dL — ABNORMAL LOW (ref 13.0–17.0)
Immature Granulocytes: 2 %
Lymphocytes Relative: 38 %
Lymphs Abs: 1.7 10*3/uL (ref 0.7–4.0)
MCH: 28 pg (ref 26.0–34.0)
MCHC: 31.2 g/dL (ref 30.0–36.0)
MCV: 89.8 fL (ref 80.0–100.0)
Monocytes Absolute: 0.8 10*3/uL (ref 0.1–1.0)
Monocytes Relative: 18 %
Neutro Abs: 1.7 10*3/uL (ref 1.7–7.7)
Neutrophils Relative %: 38 %
Platelets: 295 10*3/uL (ref 150–400)
RBC: 2.64 MIL/uL — AB (ref 4.22–5.81)
RDW: 18.7 % — AB (ref 11.5–15.5)
WBC: 4.4 10*3/uL (ref 4.0–10.5)
nRBC: 0 % (ref 0.0–0.2)

## 2018-05-30 NOTE — Progress Notes (Signed)
Occupational Therapy Session Note  Patient Details  Name: [name] MRN: [id number] Date of Birth: 14-Jun-1930  Today's Date: 05/30/2018 OT Individual Time: 1100-1145 OT Individual Time Calculation (min): 45 min    Short Term Goals: Week 1:  OT Short Term Goal 1 (Week 1): STGs=LTGs due to ELOS  Skilled Therapeutic Interventions/Progress Updates:    Upon entering the room, pt seated in recliner chair with no c/o pain this session. Pt agreeable to OT intervention. OT reviewing OT purpose, POC, and goals with pt verbalizing understanding and agreement this session. Pt requesting to shower and ambulating without use of AD to obtain all needed items with supervision overall. Pt carried items into bathroom and placed on grab bar before entering the shower. Pt verbalized set up at home as being walk in shower with shower chair similar to hospital set up. Pt remained on shower seat and standing to wash buttocks and peri area with supervision as well. Pt needing min cuing to sit to finishing drying off with towel for safety. He donned clothing items with sit <>stand in bathroom with supervision as well. Pt standing at sink for grooming tasks with distant supervision. Pt taking seated rest break and OT reviewed pt's progress towards discharge goals within this session. Pt very motivated to return home. Chair alarm belt donned for safety and call bell within reach upon exiting the room.   Therapy Documentation Precautions:  Precautions Precautions: Fall Precaution Comments: Rt hemi Restrictions Weight Bearing Restrictions: No   Pain: Pain Assessment Pain Scale: 0-10 Pain Score: 0-No pain   Therapy/Group: Individual Therapy  Gypsy Decant 05/30/2018, 12:48 PM

## 2018-05-30 NOTE — Progress Notes (Signed)
Social Work Patient ID: [name], male   DOB: [birth year], 85 y.o.   MRN: [id number] met with pt to inform team conference goals mod/i level and target discharge 1/15. Pt is very pleased with this and how well he has done here. He reports his wife is missing him and he her. He will let his daughter know, this worker offered to call daughter but pt declined and said he would talk with her tonight.

## 2018-05-30 NOTE — Progress Notes (Addendum)
Physical Therapy Discharge Summary  Patient Details  Name: [name] MRN: [id number] Date of Birth: Aug 14, 1930  Today's Date: 05/31/2018   Patient has met 8 of 8 long term goals due to improved activity tolerance, improved balance, improved postural control, increased strength, ability to compensate for deficits, functional use of  right upper extremity and right lower extremity, improved attention, improved awareness and improved coordination.  Patient to discharge at an ambulatory level mod I without an AD.     Reasons goals not met: n/a  Recommendation:  Patient will benefit from ongoing skilled PT services in home health setting to continue to advance safe functional mobility, address ongoing impairments in high level balance, and minimize fall risk.  Equipment: No equipment provided  Reasons for discharge: treatment goals met and discharge from hospital  Patient/family agrees with progress made and goals achieved: Yes  PT Discharge Precautions/Restrictions Precautions Precautions: Fall Restrictions Weight Bearing Restrictions: No  Vision/Perception  Pt wears glasses at all times at baseline. Pt denies changes in baseline vision following medical event but reports it is time to update his lens prescription.  Cognition Overall Cognitive Status: No family/caregiver present to determine baseline cognitive functioning Arousal/Alertness: Awake/alert Orientation Level: Oriented X4 Memory: Appears intact Awareness: Appears intact Problem Solving: Appears intact Safety/Judgment: Appears intact  Sensation Coordination Gross Motor Movements are Fluid and Coordinated: (impaired RUE) Fine Motor Movements are Fluid and Coordinated: (impaired RUE)   Motor  Motor Motor - Skilled Clinical Observations: hemiparesis RUE>RLE   Mobility Bed Mobility Bed Mobility: Rolling Right;Rolling Left;Supine to Sit;Sit to Supine Rolling Right: Independent Rolling Left:  Independent Supine to Sit: Independent Sit to Supine: Independent Transfers Transfers: Sit to Stand;Stand to Sit Sit to Stand: Independent Stand to Sit: Independent   Locomotion  Gait Ambulation: Yes Gait Assistance: Independent Gait Distance (Feet): 150 Feet Assistive device: None Gait Gait: Yes Gait Pattern: Impaired Gait Pattern: Antalgic Stairs / Additional Locomotion Stairs: Yes Stairs Assistance: Independent with assistive device Stair Management Technique: One rail Left Number of Stairs: 12 Height of Stairs: 6(inches) Ramp: Independent(ambulatory without AD) Curb: Supervision/Verbal cueing(ambulatory without AD/UE support) Product manager Mobility: No     Balance Balance Balance Assessed: Yes Standardized Balance Assessment Standardized Balance Assessment: Furniture conservator/restorer Berg Balance Test Sit to Stand: Able to stand without using hands and stabilize independently Standing Unsupported: Able to stand safely 2 minutes Sitting with Back Unsupported but Feet Supported on Floor or Stool: Able to sit safely and securely 2 minutes Stand to Sit: Sits safely with minimal use of hands Transfers: Able to transfer safely, definite need of hands Standing Unsupported with Eyes Closed: Able to stand 10 seconds safely Standing Ubsupported with Feet Together: Able to place feet together independently and stand 1 minute safely From Standing, Reach Forward with Outstretched Arm: Can reach forward >12 cm safely (5") From Standing Position, Pick up Object from Floor: Able to pick up shoe safely and easily From Standing Position, Turn to Look Behind Over each Shoulder: Looks behind one side only/other side shows less weight shift Turn 360 Degrees: Able to turn 360 degrees safely but slowly Standing Unsupported, Alternately Place Feet on Step/Stool: Able to complete 4 steps without aid or supervision Standing Unsupported, One Foot in Front: Able to plae foot ahead of  the other independently and hold 30 seconds Standing on One Leg: Able to lift leg independently and hold equal to or more than 3 seconds Total Score: 46   05/28/2018 DGI: 17/24 Berg Balance Test:  42/56  Extremity Assessment: RUE impaired shoulder elevation & abduction, impaired strength in grasp. LUE Assessment LUE Assessment: Within Functional Limits  RLE Assessment RLE Assessment: Within Functional Limits LLE Assessment LLE Assessment: Within Functional Limits    [name] 05/31/2018, 4:18 PM

## 2018-05-30 NOTE — Progress Notes (Signed)
[facility] PHYSICAL MEDICINE & REHABILITATION PROGRESS NOTE  Subjective/Complaints:  Denies dizziness, discussed RUE fxn, before recent stroke able to use RUE , button buttons, has mild residual weakness form 2016 CVA  ROS: Denies CP, shortness of breath, nausea, vomiting, diarrhea.  Objective: Vital Signs: Blood pressure (!) 179/68, pulse (!) 45, temperature (!) 97.5 F (36.4 C), temperature source Oral, resp. rate 17, height 6\' 1"  (1.854 m), weight 95.4 kg, SpO2 98 %. No results found. Recent Labs    05/30/18 0703  WBC 4.4  HGB 7.4*  HCT 23.7*  PLT 295   Recent Labs    05/30/18 0703  NA 138  K 3.8  CL 107  CO2 24  GLUCOSE 111*  BUN 24*  CREATININE 1.43*  CALCIUM 8.3*    Physical Exam: BP (!) 179/68 (BP Location: Left Arm)   Pulse (!) 45   Temp (!) 97.5 F (36.4 C) (Oral)   Resp 17   Ht 6\' 1"  (1.854 m)   Wt 95.4 kg   SpO2 98%   BMI 27.75 kg/m  Constitutional: No distress . Vital signs reviewed. HENT: Normocephalic.  Atraumatic. Eyes: EOMI. No discharge. Cardiovascular: + Bradycardia.  Regular rhythm. No JVD. Respiratory: CTA bilaterally.  Normal effort. GI: BS +. Non-distended. Musc: No edema or tenderness in extremities. Genitourinary: Suprapubic cath in place Neurological: Alert Follows full commands.  Fair awareness of deficits.  Motor: LUE/LLE 4+/5.  RUE/RLE: 4/5 proximal to distal, with decreased [place] Skin: Warm and dry.  Intact.  Assessment/Plan: 1. Functional deficits secondary to left MCA infarct which require 3+ hours per day of interdisciplinary therapy in a comprehensive inpatient rehab setting.  Physiatrist is providing close team supervision and 24 hour management of active medical problems listed below.  Physiatrist and rehab team continue to assess barriers to discharge/monitor patient progress toward functional and medical goals  Care Tool:  Bathing    Body parts bathed by patient: Right arm, Left arm, Chest, Face          Bathing assist Assist Level: Contact Guard/Touching assist     Upper Body Dressing/Undressing Upper body dressing   What is the patient wearing?: Pull over shirt    Upper body assist Assist Level: Independent    Lower Body Dressing/Undressing Lower body dressing      What is the patient wearing?: Underwear/pull up, Pants     Lower body assist Assist for lower body dressing: Contact Guard/Touching assist     Toileting Toileting    Toileting assist Assist for toileting: Minimal Assistance - Patient > 75%     Transfers Chair/bed transfer  Transfers assist     Chair/bed transfer assist level: Supervision/Verbal cueing     Locomotion Ambulation   Ambulation assist      Assist level: Contact Guard/Touching assist   Max distance: 236ft    Walk 10 feet activity   Assist     Assist level: Contact Guard/Touching assist     Walk 50 feet activity   Assist    Assist level: Contact Guard/Touching assist      Walk 150 feet activity   Assist    Assist level: Contact Guard/Touching assist      Walk 10 feet on uneven surface  activity   Assist     Assist level: Contact Guard/Touching assist     Wheelchair     Assist Will patient use wheelchair at discharge?: No   Wheelchair activity did not occur: N/A  Wheelchair 50 feet with 2 turns activity    Assist    Wheelchair 50 feet with 2 turns activity did not occur: N/A       Wheelchair 150 feet activity     Assist Wheelchair 150 feet activity did not occur: N/A          Medical Problem List and Plan: 1.Left side weaknesssecondary to left MCA infarction, high-grade left MCA M1 stenosis.Plan outpatient cerebral angiogram question angioplasty stenting 06/06/2018 at 7:30 AM per interventional radiology  Continue CIR PT, OT 2. DVT Prophylaxis/Anticoagulation: Subcutaneous Lovenox. Monitor for any bleeding episodes 3. Pain Management:Tylenol as  needed 4. Mood:Provide emotional support 5. Neuropsych: This patientiscapable of making decisions on hisown behalf. 6. Skin/Wound Care:Routine skin checks 7. Fluids/Electrolytes/Nutrition:Routine ins and outs 8. History of CAD with CABG. No chest pain or shortness of breath 9. Diastolic congestive heart failure. Monitor for signs of fluid overload. Patient on Lasix 20 mg daily prior to admission. Resume as needed Filed Weights   05/27/18 1139  Weight: 95.4 kg  10. Diabetes mellitus peripheral neuropathy. Hemoglobin A1c 8.2. SSI. Patient Amaryl 4 mg twice a day, Glucophage 1000 mg twice a day prior to admission. Resume as needed  Monitor with increased mobility 11. Hypertension. Permissive hypertension. Patient on Norvasc 10 mg daily, Coreg 3.125 mg every a.m. and 6.25 mg every p.m.,Imdur 60 mg twice a day, hydralazine 100 mg twice a dayprior to admission and resume as needed  Norvasc 5 started on 1/12   Vitals:   05/29/18 1940 05/30/18 0404  BP: (!) 180/77 (!) 179/68  Pulse: (!) 51 (!) 45  Resp: 17 17  Temp: (!) 97.5 F (36.4 C) (!) 97.5 F (36.4 C)  SpO2: 99% 98%  elevated , will monitor with med changes  Monitor with increased mobility 12. Chronic renal insufficiency. Baseline creatinine 1.41.  Creatinine 1.45 on 1/10, stable at 1.43 1/13  Labs ordered for tomorrow  Continue to monitor 13. Hyperlipidemia. Crestor 14. Chronic anemia. Continue iron supplement  Hemoglobin 7.9 on 1/10  Stable at 7.4 , will order stool guaic, pt has had work up for this "Drs haven't found anything"  Continue to monitor  LOS: 3 days A FACE TO Gracey E Deigo Alonso 05/30/2018, 8:57 AM

## 2018-05-30 NOTE — IPOC Note (Addendum)
Overall Plan of Care [facility]) Patient Details Name: [name] MRN: [id number] DOB: Aug 03, 1930  Admitting Diagnosis: <principal problem not specified>  Hospital Problems: Active Problems:   Left middle cerebral artery stroke (HCC)   Anemia of chronic disease   CKD (chronic kidney disease), stage III (HCC)   Poorly controlled type 2 diabetes mellitus with peripheral neuropathy ([place])   Hypertensive crisis     Functional Problem List: Nursing Medication Management, Motor, Endurance  PT Balance, Endurance, Motor, Perception, Safety, Sensory, Skin Integrity  OT Balance, Safety, Sensory, Motor  SLP    TR         Basic ADL's: OT Grooming, Bathing, Dressing, Toileting     Advanced  ADL's: OT Simple Meal Preparation, Light Housekeeping     Transfers: PT Bed Mobility, Bed to Chair, Car, [employer], Futures trader, Metallurgist: PT Ambulation, Emergency planning/management officer     Additional Impairments: OT Fuctional Use of Upper Extremity  SLP        TR      Anticipated Outcomes Item Anticipated Outcome  Self Feeding No goal  Swallowing      Basic self-care  Mod I   Toileting  Mod I    Bathroom Transfers Mod I   Bowel/Bladder  n/a  Transfers  Mod I with LRAD   Locomotion  Mod I ambulatory with LRAD   Communication     Cognition     Pain  n/a  Safety/Judgment  n/a   Therapy Plan: PT Intensity: Minimum of 1-2 x/day ,45 to 90 minutes PT Frequency: 5 out of 7 days PT Duration Estimated Length of Stay: 4-6 days  OT Intensity: Minimum of 1-2 x/day, 45 to 90 minutes OT Frequency: 5 out of 7 days OT Duration/Estimated Length of Stay: 5-7 days      Team Interventions: Nursing Interventions Medication Management, Discharge Planning, Patient/Family Education, Disease Management/Prevention  PT interventions Ambulation/gait training, Training and development officer, Community reintegration, Discharge planning, Disease management/prevention, DME/adaptive  equipment instruction, Functional electrical stimulation, Functional mobility training, Neuromuscular re-education, Pain management, Patient/family education, Psychosocial support, Skin care/wound management, Splinting/orthotics, Stair training, Therapeutic Activities, Therapeutic Exercise, UE/LE Strength taining/ROM, UE/LE Coordination activities, Visual/perceptual remediation/compensation, Wheelchair propulsion/positioning  OT Interventions Training and development officer, Community reintegration, Disease mangement/prevention, Neuromuscular re-education, Barrister's clerk education, Self Care/advanced ADL retraining, Therapeutic Exercise, UE/LE Coordination activities, Wheelchair propulsion/positioning, Discharge planning, DME/adaptive equipment instruction, Functional mobility training, Pain management, Psychosocial support, Therapeutic Activities, UE/LE Strength taining/ROM  SLP Interventions    TR Interventions    SW/CM Interventions  Psychosocial Assessment, Discharge Planning & Pt & Family Education   Barriers to Discharge MD  Medical stability  Nursing      PT Decreased caregiver support, Inaccessible home environment, Home environment access/layout, Wound Care, Incontinence    OT Decreased caregiver support, Medical stability    SLP      SW       Team Discharge Planning: Destination: PT-Home ,OT- Home , SLP-  Projected Follow-up: PT-Outpatient PT, Home health PT, OT-  Home health OT, Outpatient OT, SLP-  Projected Equipment Needs: PT-To be determined, OT- To be determined, SLP-  Equipment Details: PT- , OT-  Patient/family involved in discharge planning: PT- Patient, Family member/caregiver,  OT-Patient, SLP-   MD ELOS: 5-7d Medical Rehab Prognosis:  Good Assessment:  85 year old right-handed male with history of AAA, CAD with CABG, diastolic congestive heart failure, suprapubic catheter 1 year, diabetes mellitus, CVA 2016 with mild right side weakness maintained on aspirin and  Plavix,previous balloon angioplasty  of left ICA petrous segment 01/25/2015 per interventional radiology, chronic renal insufficiency. Per chart review and patient, patient lives with spouse. 2 level home with ramped entrance. Independent driving managing medications. 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 He has a daughter approximate 40 miles away that works. Presented 05/24/2018 with right-sided weakness. Cranial CT scan reviewed, unremarkable for acute intracranial process. Per report, stable small chronic left frontoparietal infarction. CT angiogram of head and neck with no emergent large vessel occlusion. 60% stenosis of proximal left ICA and severe stenosis of the distal left M1 segment with preserved distal patency. MRI the brain multifocal acute ischemia within the left MCA territory as well as additional small focus of ischemia in the right frontal white matter. Carotid Dopplers with no ICA stenosis. Echocardiogram with ejection fraction of 55% no wall motion abnormalities grade 1 diastolic dysfunction. Currently maintained on aspirin and Plavix for CVA prophylaxis. Subcutaneous Lovenox for DVT prophylaxis. Tolerating a regular diet. Interventional radiology consult to 05/26/2018 after CT angiogram head and neck reviewed current plans for outpatient cerebral angiogram question need for angioplasty/stenting 06/06/2018 7:30 AM   Now requiring 24/7 Rehab RN,MD, as well as CIR level PT, OT .  Treatment team will focus on ADLs and mobility with goals set at Mod I See Team Conference Notes for weekly updates to the plan of care

## 2018-05-30 NOTE — Progress Notes (Signed)
Physical Therapy Session Note  Patient Details  Name: [name] MRN: [id number] Date of Birth: 1931/03/31  Today's Date: 05/30/2018 PT Individual Time: 9794-8016  PT Individual Time Calculation (min): 54 min   Short Term Goals: Week 1:  PT Short Term Goal 1 (Week 1): STG=LTG due to ELOS   Skilled Therapeutic Interventions/Progress Updates:  Pt received in bathroom, finished with toileting tasks. Pt ambulates out of bathroom & in room without AD & distant supervision. Pt completes hand hygiene at sink without assistance and switches to leg catheter bag with min assist but able to provide instruction on how to help him manage elastic leg loop. 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 Discussed home set up (ramped entry, steps to access his bedroom on 2nd floor with L ascending rail and landing in middle of step); he also reports he was driving prior to admission and therapist educated him on need for MD to clear him to drive again. Pt reports he has family/friends that can drive him to appointments and his daughter works every other day and can intermittently assist him and his family members. Pt ambulates around unit without AD & distant supervision, negotiating 12 steps with L ascending rail and supervision, completing floor transfer with mod I with therapist educating him on instances when he should call EMS after a fall, and completing car transfer at sedan simulated height with distant supervision. Pt reports he has a hospital bed on the first floor that he plans to use upon d/c & will not have to access 2nd floor. Educated him on HHPT f/u & pt agreeable. At end of session pt left sitting in recliner with chair  alarm donned & all needs in reach. Pt voices no concerns regarding potential d/c home this week.   Therapy Documentation Precautions:  Precautions Precautions: Fall Precaution Comments: Rt hemi Restrictions Weight Bearing Restrictions: No    Therapy/Group: Individual Therapy  [name] 05/30/2018, 2:31 PM

## 2018-05-30 NOTE — Progress Notes (Signed)
Physical Therapy Session Note  Patient Details  Name: [name] MRN: [id number] Date of Birth: 01-28-1931  Today's Date: 05/30/2018 PT Individual Time: 4166-0630 PT Individual Time Calculation (min): 87 min   Short Term Goals: Week 1:  PT Short Term Goal 1 (Week 1): STG=LTG due to ELOS   Skilled Therapeutic Interventions/Progress Updates:   Pt received in recliner & agreeable to tx. Pt ambulates from hospital room to gift shop and back with distant supervision and verbal directional cuing for endurance and to simulate community mobility. Pt performs simulated laundry tasks in gym by hanging and taking down towels and pillow cases onto a clothesline with clothespins as well as folding laundry independently for standing endurance and RUE use. Pt ambulates from gym to day room with distant supervision. Pt uses Biodex Limits of Stability setting with BUE>1UE>no UE support with close supervision progressing to unstable moving base with min assist for balance. Pt uses Nustep for 10 minutes at level 5 progressing to level 6 at 5 minutes for endurance and UE/LE coordination. Pt performs ladder drills forward, side-stepping, and in/out of squares with direction changes to challenge dynamic balance. Pt also performs crossover steps with handrail in front and CGA and backwards walking with 1 handrail and CGA 60 ft. Pt ambulates back to room with distant supervision and left in recliner with chair alarm donned and all needs in reach.   Pt denies pain during tx session.  Therapy Documentation Precautions:  Precautions Precautions: Fall Precaution Comments: Rt hemi Restrictions Weight Bearing Restrictions: No    Mobility: Bed Mobility Bed Mobility: Rolling Right;Rolling Left;Supine to Sit;Sit to Supine Rolling Right: Independent Rolling Left: Independent Supine to Sit: Independent Sit to Supine: Independent Transfers Transfers: Sit to Stand;Stand to Sit Sit to Stand: Independent Stand to  Sit: Independent Locomotion : Gait Ambulation: Yes Gait Assistance: Independent Gait Distance (Feet): 150 Feet Assistive device: None Gait Gait: Yes Gait Pattern: Impaired Gait Pattern: Antalgic Stairs / Additional Locomotion Stairs: Yes Stairs Assistance: Independent with assistive device Stair Management Technique: One rail Left Number of Stairs: 12 Height of Stairs: 6(inches) Wheelchair Mobility Wheelchair Mobility: No     Therapy/Group: Individual Therapy  Kimani Bedoya 05/30/2018, 3:47 PM

## 2018-05-30 NOTE — Patient Care Conference (Signed)
Inpatient RehabilitationTeam Conference and Plan of Care Update Date: 05/30/2018   Time: 1:30 PM    Patient Name: [name]      Medical Record Number: [id number]  Date of Birth: Jan 08, 1931 Sex: Male         Room/Bed: 4W22C/4W22C-01 Payor Info: Payor: MEDICARE / Plan: MEDICARE PART A AND B / Product Type: *No Product type* /    Admitting Diagnosis: CVA  Admit Date/Time:  05/27/2018 11:15 AM Admission Comments: No comment available   Primary Diagnosis:  <principal problem not specified> Principal Problem: <principal problem not specified>  Patient Active Problem List   Diagnosis Date Noted  . Hypertensive crisis   . Anemia of chronic disease   . CKD (chronic kidney disease), stage III ([place])   . Poorly controlled type 2 diabetes mellitus with peripheral neuropathy ([place])   . Left middle cerebral artery stroke ([place]) 05/27/2018  . Vitamin D deficiency 05/27/2018  . Acute CVA (cerebrovascular accident) ([place]) 05/25/2018  . Right arm weakness 05/25/2018  . Right sided weakness   . Coronary artery disease involving coronary bypass graft of native heart without angina pectoris   . History of CVA with residual deficit   . Acute blood loss anemia   . Dilated cardiomyopathy ([place]) 04/26/2018  . Fatigue 04/26/2018  . Chronic combined systolic and diastolic CHF (congestive heart failure) ([place])   . Leukocytosis 04/17/2018  . Chronic diastolic CHF (congestive heart failure) ([place]) 04/17/2018  . History of suprapubic catheter 04/17/2018  . Chronic anemia 04/17/2018  . Chest pain 01/28/2018  . Pseudomonas urinary tract infection   . Hydrocele, right   . Olecranon bursitis of left elbow   . CKD (chronic kidney disease) stage 3, GFR 30-59 ml/min (HCC)   . Suprapubic catheter ([place])   . History of urethral stricture   . Hemispheric carotid artery syndrome 12/22/2016  . AKI (acute kidney injury) ([place]) 03/28/2016  . S/P angioplasty with stent 03/26/16 to VG to PDA for in-stent restenosis with  DES. 03/28/2016  . History of ST elevation myocardial infarction (STEMI) 03/28/2016  . Carpal tunnel syndrome of right wrist 06/24/2015  . History of stroke 03/21/2015  . Diabetes mellitus treated with oral medication ([place]) 03/21/2015  . Stenosis of left carotid artery   . Essential hypertension   . Hyperlipidemia LDL goal <70   . Cerebral infarction due to stenosis of left middle cerebral artery ([place]) 01/22/2015  . CAD S/P percutaneous coronary angioplasty 09/19/2014  . Coronary artery disease involving coronary bypass graft of native heart with angina pectoris ([place]) 09/19/2014  . Shortness of breath 09/19/2014  . Polypharmacy 09/19/2014  . Abdominal aortic aneurysm ([place])   . Stroke ([place])   . Hyperlipidemia associated with type 2 diabetes mellitus ([place])   . Type II diabetes mellitus with complication ([place])   . Coronary artery disease involving native coronary artery of native heart with angina pectoris ([place])   . Abnormal TSH 06/19/2014  . Obesity 06/19/2014  . STEMI (ST elevation myocardial infarction) ([place]) 06/18/2014  . NSTEMI (non-ST elevated myocardial infarction) ([place]) 06/17/2014  . Bilateral lower extremity edema 03/30/2014  . Dizziness 02/21/2014  . Bradycardia 08/31/2013  . Postoperative atrial fibrillation (HCC)     Class: Temporary  . Diabetes mellitus type 2 in obese ([place])   . Apnea, sleep   . Balanitis xerotica obliterans   . History of: Non-STEMI (non-ST elevated myocardial infarction) 11/16/2010    Class: History of  . S/P CABG x 2 11/16/2010  Expected Discharge Date: Expected Discharge Date: 06/01/18  Team Members Present: Physician leading conference: Dr. Alysia Penna Social Worker Present: Ovidio Kin, LCSW Nurse Present: Rayetta Pigg, RN;Chelsey Amalia Hailey, RN PT Present: Lavone Nian, PT OT Present: Simonne Come, OT SLP Present: Stormy Fabian, SLP     Current Status/Progress Goal Weekly Team Focus  Medical   Pt admitted for increased Right  sided weakness, prior CVA in 2016, Left M1 stenosis scheduled for stent  Reduce fall risk, reduce stroke recurrence risk  Initiate rehab program as well as d/c planning, follow  up and monitor anemia   Bowel/Bladder   continent of bowel; suprapubic cath   Maintain regular bowel movement pattern   assist with tolieting prn    Swallow/Nutrition/ Hydration             ADL's   contact guard bathing and dressing except Min assist for donning socks/shoes, CGA for ambulation and IADLs without AD  Mod I goals  ADL retraining, dynamic standing balance during ADLs and IADLs, pt/family education, d/c planning   Mobility   distant supervision overall without AD  mod I  high level balance, d/c planning, gait, stair negotiation, pt education, floor transfer   Communication             Safety/Cognition/ Behavioral Observations            Pain   no c/o of pain  maintain pain free  assess for pain q shift and prn    Skin   suprapubic cath mid, lower abdomen; no skin infection/breakdown  Maintain skin intergrity   assess skin q shift and prn       *See Care Plan and progress notes for long and short-term goals.     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 She will be transporting to appointments and some home management      Team Discussion:  Goals mod/i level doing well in his therapies and making good progress almost back to his baseline of functioning. Manages suprapubic cath and has appointment for it to be changed in the urologist office. Berg 42/56, aware of his risk to fall at home. Stent surgery scheduled for 1/20.   Revisions to Treatment Plan:  DC 1/15    Continued Need for Acute Rehabilitation Level of Care: The patient requires daily medical management by a physician with specialized training in physical medicine and rehabilitation for the following conditions: Daily direction of a  multidisciplinary physical rehabilitation program to ensure safe treatment while eliciting the highest outcome that is of practical value to the patient.: Yes Daily medical management of patient stability for increased activity during participation in an intensive rehabilitation regime.: Yes Daily analysis of laboratory values and/or radiology reports with any subsequent need for medication adjustment of medical intervention for : Neurological problems;Other;Urological problems   I attest that I was present, lead the team conference, and concur with the assessment and plan of the team.   Elease Hashimoto 05/31/2018, 9:09 AM

## 2018-05-30 NOTE — Progress Notes (Signed)
Social Work  Social Work Assessment and Plan  Patient Details  Name: [name] MRN: [id number] Date of Birth: 01-12-1931  Today's Date: 05/30/2018  Problem List:  Patient Active Problem List   Diagnosis Date Noted  . Hypertensive crisis   . Anemia of chronic disease   . CKD (chronic kidney disease), stage III ([place])   . Poorly controlled type 2 diabetes mellitus with peripheral neuropathy ([place])   . Left middle cerebral artery stroke ([place]) 05/27/2018  . Vitamin D deficiency 05/27/2018  . Acute CVA (cerebrovascular accident) ([place]) 05/25/2018  . Right arm weakness 05/25/2018  . Right sided weakness   . Coronary artery disease involving coronary bypass graft of native heart without angina pectoris   . History of CVA with residual deficit   . Acute blood loss anemia   . Dilated cardiomyopathy ([place]) 04/26/2018  . Fatigue 04/26/2018  . Chronic combined systolic and diastolic CHF (congestive heart failure) ([place])   . Leukocytosis 04/17/2018  . Chronic diastolic CHF (congestive heart failure) ([place]) 04/17/2018  . History of suprapubic catheter 04/17/2018  . Chronic anemia 04/17/2018  . Chest pain 01/28/2018  . Pseudomonas urinary tract infection   . Hydrocele, right   . Olecranon bursitis of left elbow   . CKD (chronic kidney disease) stage 3, GFR 30-59 ml/min (HCC)   . Suprapubic catheter ([place])   . History of urethral stricture   . Hemispheric carotid artery syndrome 12/22/2016  . AKI (acute kidney injury) ([place]) 03/28/2016  . S/P angioplasty with stent 03/26/16 to VG to PDA for in-stent restenosis with DES. 03/28/2016  . History of ST elevation myocardial infarction (STEMI) 03/28/2016  . Carpal tunnel syndrome of right wrist 06/24/2015  . History of stroke 03/21/2015  . Diabetes mellitus treated with oral medication ([place]) 03/21/2015  . Stenosis of left carotid artery   . Essential hypertension   . Hyperlipidemia LDL goal <70   . Cerebral infarction due to stenosis of left  middle cerebral artery ([place]) 01/22/2015  . CAD S/P percutaneous coronary angioplasty 09/19/2014  . Coronary artery disease involving coronary bypass graft of native heart with angina pectoris ([place]) 09/19/2014  . Shortness of breath 09/19/2014  . Polypharmacy 09/19/2014  . Abdominal aortic aneurysm ([place])   . Stroke ([place])   . Hyperlipidemia associated with type 2 diabetes mellitus ([place])   . Type II diabetes mellitus with complication ([place])   . Coronary artery disease involving native coronary artery of native heart with angina pectoris ([place])   . Abnormal TSH 06/19/2014  . Obesity 06/19/2014  . STEMI (ST elevation myocardial infarction) ([place]) 06/18/2014  . NSTEMI (non-ST elevated myocardial infarction) ([place]) 06/17/2014  . Bilateral lower extremity edema 03/30/2014  . Dizziness 02/21/2014  . Bradycardia 08/31/2013  . Postoperative atrial fibrillation (HCC)     Class: Temporary  . Diabetes mellitus type 2 in obese ([place])   . Apnea, sleep   . Balanitis xerotica obliterans   . History of: Non-STEMI (non-ST elevated myocardial infarction) 11/16/2010    Class: History of  . S/P CABG x 2 11/16/2010   Past Medical History:  Past Medical History:  Diagnosis Date  . Abdominal aortic aneurysm ([place])    a. Aortic duplex 06/2014: mild aneurysmal dilatation of proximal abdominal aorta measuring 3.4x3.4cm. No sig change from 2012. F/u due 06/2016;   . AKI (acute kidney injury) ([place]) 03/28/2016  . Arthritis    "right knee; never bothered me" (03/26/2016)  . 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 CAD in native artery    a. NSTEMI 11/2010 - CABG x2(LIMA to LAD, SVG to PDA). b. NEG Lexi MV 10/24/13, EF 53%, no perfusion abnormality, septal and apical HK noted. c. NSTEMI 05/2014 - s/p DES to SVG-RPDA 06/18/14 (Xience Alpine DES 3.0 x 18 mm -3.35 mm), EF 60-65; d. 03/2016 STEMI/PCI: LM nl, ost LAD 70%, mLCx 50%, pRCA 95% - mRCA 60%, dRPDA 70%, LIMA->LAD ok, o-p SVG->RPDA 100%  (3.0x16 Promus DES overlapping prior stent).  . Diastolic dysfunction    a. 03/2016 Echo: EF 60-65%, no rwma, Gr1 DD, triv AI, Ao root 74mm, Asc Ao 62mm, triv MR.  . Dizziness    a. Carotid duplex 03/2014: mild fibrous plaque, no significant stenosis.  . Foley catheter in place    "been wearing it for a couple months now" (03/26/2016)  . HTN (hypertension)   . Hyperlipidemia LDL goal <70   . Medical history non-contributory   . Non-STEMI (non-ST elevated myocardial infarction) ([place]) 11/2010; 03/2018   1. Ostial LAD 70% (to close to [facility] for PCI), subtotal occlusion of the RCA; 2.  In-stent restenosis SVG-RCA -PTCA.  Marland Kitchen Postoperative atrial fibrillation ([place]) 11/2010   Post CABG, no sign recurrence  . Refusal of blood transfusions as patient is Jehovah's Witness   . 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 Servando Snare)  . Sleep apnea    Not on CPAP. (03/26/2016)  . ST elevation myocardial infarction (STEMI) of inferior wall ([place]) 04/05/2016   Occluded in-stent restenosis/thrombosis of SVG-RCA --> treated with overlapping Promus DES 3.0 mm 16 mm postdilated 3.0 mm).  . Stroke [facility]) 2014; 01/2015   a. 2014 with mild right hand weakness, nonhemorrhagic per pt.;; b. - PTA-Stent L ICA 95%   . Type II diabetes mellitus ([place])    Past Surgical History:  Past Surgical History:  Procedure Laterality Date  . APPENDECTOMY    . CARDIAC CATHETERIZATION  12/11/2010   Dr. Chase Picket - subsequent cath - normal LV systolic function, no renal artery stenosis, severe 2-vessel disease with subtotaled RCA prox and distal 60% lesion and complex 70% area of narrowing of ostium of LAD  . CARDIAC CATHETERIZATION N/A 03/26/2016   Procedure: Left Heart Cath and Coronary Angiography;  Surgeon: Nelva Bush, MD;  Location: [facility];  Service: Cardiovascular;  Laterality: N/A;  . CARDIAC CATHETERIZATION N/A 03/26/2016   Procedure: Coronary Stent Intervention;  Surgeon: Nelva Bush, MD;  Location:  [facility];  Service: Cardiovascular: 100% In-stent thrombosis of pros SVG-RCA (Xience DES) --> treated with PromusDES 3.0 x 18 (3.3 mm)  . CARDIAC CATHETERIZATION N/A 03/26/2016   Procedure: Bypass Graft Angiography;  Surgeon: Nelva Bush, MD;  Location: [facility];  Service: Cardiovascular;  Laterality: N/A;  . CORONARY ANGIOPLASTY WITH STENT PLACEMENT  06/18/2014   PCI to SVG-RPDA 06/18/14 (Xience Alpine DES 3.0 x 18 mm -3.35 mm),   . CORONARY ARTERY BYPASS GRAFT  12/15/2010   X2, Dr Servando Snare; LIMA to LAD, SVG to PDA;   . CORONARY BALLOON ANGIOPLASTY N/A 04/18/2018   Procedure: CORONARY BALLOON ANGIOPLASTY;  Surgeon: Leonie Man, MD;  Location: [facility];  Service: Cardiovascular;;; high pressure scoring and noncompliant balloon PTCA of SVG-RCA ISR ostial and proximal  . CORONARY/GRAFT ANGIOGRAPHY N/A 08/27/2017   Procedure: CORONARY/GRAFT ANGIOGRAPHY;  Surgeon: Nelva Bush, MD;  Location: [facility];  Service: Cardiovascular;; pLAD 70%, ostD1 50%.  mCx  60%, OM2 80%. pRCA 95% & mRCA 100% - rPDA 70%. LIMA-mLAD patent. SVG-rPDA 10% ISR.   Marland Kitchen CYSTOSCOPY WITH URETHRAL DILATATION    . IR ANGIO INTRA EXTRACRAN SEL COM CAROTID INNOMINATE BILAT MOD SED  02/25/2017  . IR ANGIO INTRA EXTRACRAN SEL COM CAROTID INNOMINATE BILAT MOD SED  10/19/2017   Dr. Kathee Delton: L Common Carotid - ECA & major branches widely patent. ICA ~20% distal to bulb & 50% in supraclinoid segment. LMCA-distal 1/3 MI ~905 stenosis with post-stenotic dilation into inferior division. ~50% prox Basilar A stenosis @ anterior Inf Cerebellar A. 50% R ICA  . IR ANGIO VERTEBRAL SEL SUBCLAVIAN INNOMINATE UNI L MOD SED  02/25/2017  . IR ANGIO VERTEBRAL SEL SUBCLAVIAN INNOMINATE UNI R MOD SED  10/19/2017  . IR ANGIO VERTEBRAL SEL VERTEBRAL UNI L MOD SED  10/19/2017  . IR GENERIC HISTORICAL  01/21/2016   IR RADIOLOGIST EVAL & MGMT 01/21/2016 [facility]-INTERV RAD  . IR GENERIC HISTORICAL  02/03/2016   IR CATHETER TUBE  CHANGE 02/03/2016 Marybelle Killings, MD [facility]-INTERV RAD  . IR RADIOLOGIST EVAL & MGMT  11/09/2017  . LEFT HEART CATH AND CORONARY ANGIOGRAPHY N/A 04/18/2018   Procedure: LEFT HEART CATH AND CORONARY ANGIOGRAPHY;  Surgeon: Leonie Man, MD;  Location: [facility];  Service: Cardiovascular;  Laterality: N/A; stable findings on last cath with exception of 75% in-stent restenosis of SVG-RCA ostial stent treated with PTCA.    Marland Kitchen LEFT HEART CATHETERIZATION WITH CORONARY ANGIOGRAM N/A 06/18/2014   Procedure: LEFT HEART CATHETERIZATION WITH CORONARY ANGIOGRAM;  Surgeon: Leonie Man, MD;  Location: [facility] CATH LAB;  Service: Cardiovascular;  -- severe disease of SVG-rPDA  . NO PAST SURGERIES    . RADIOLOGY WITH ANESTHESIA N/A 01/24/2015   Procedure: STENT ASSISTED ANGIOPLASTY (RADIOLOGY WITH ANESTHESIA);  Surgeon: Luanne Bras, MD;  Location: [facility];  Service: Radiology;  Laterality: N/A;  . TONSILLECTOMY    . TRANSTHORACIC ECHOCARDIOGRAM  08/2017; 04/2018:    A) EF 60-65%. Mild LVH. No RWMA. Gr 1 DD. Mod LA dilation.;  B)  EF 35-40%.  Severe LVH.  GRII DD.  Apical anteroseptal hypokinesis.  Akinesis of the apex.   Social History:  reports that he quit smoking about 59 years ago. His smoking use included cigarettes. He has a 3.30 pack-year smoking history. He has never used smokeless tobacco. He reports that he does not drink alcohol or use drugs.  Family / Support Systems Marital Status: Married Patient Roles: Spouse, Caregiver, Parent Spouse/Significant Other: Faye-daughter [phone] Children: Jamey Ripa [name]-daughter [phone] Other Supports: Friends and church members Anticipated Caregiver: Jeani Hawking and close friends Ability/Limitations of Caregiver: Jeani Hawking works M, W, F driving [employer] Le[employer] 12 hr shifts, can check on them Caregiver Availability: Intermittent Family Dynamics: Close knit small family who support one another. Pt currently is the caregiver for his wife and his mother in-law both are home  bound. They have friends and church members who are supportive and will chekc on them.  Social History Preferred language: English Religion: Jehovah's Witness Cultural Background: No issues Education: High School Read: Yes Write: Yes Employment Status: Retired Public relations account executive Issues: No issues Guardian/Conservator: None-according to MD pt is capable of making his own decisions while here. He will include his daughter per his request.   Abuse/Neglect Abuse/Neglect Assessment Can Be Completed: Yes Physical Abuse: Denies Verbal Abuse: Denies Sexual Abuse: Denies Exploitation of patient/patient's resources: Denies Self-Neglect: Denies  Emotional Status Pt's affect, behavior and adjustment status: Pt is motivated and ready  to work he is pleased with his progress thus far but wants to make more progress. 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 His wife and mother in-law get around with rolling walkers and are virtually home bound according to him.  Recent Psychosocial Issues: he has multiple health issues but are managed by his many MD's. He was independent prior to admission and driving Psychiatric History: No history deferred depression due to coping appropriately and able to verbalize his concerns and goals. Will monitor and see if would benefit from seeing neuro-psych then make referral. Substance Abuse History: No issues  Patient / Family Perceptions, Expectations & Goals Pt/Family understanding of illness & functional limitations: Pt can explain his stroke and deficits he has he is recovering which is encouraging to him and he feels he can reach his mod/i levle goals. He talks with the MD and feels his questions are being addressed. He is aware [facility] is about one week here which he is agreeable to. Premorbid pt/family roles/activities: Husband, son in-law, father, retiree, caregiver, church member, etc Anticipated changes in  roles/activities/participation: resume Pt/family expectations/goals: Pt states: " I am fit as a fiddle and ready to get this going, to get home."  Daughter states: " He will do whatever you ask him to do to get better he is an amazing man."  Recruitment consultant: None Premorbid Home Care/DME Agencies: Other (Comment)(Between all of them they have many pieces of equipment) Transportation available at discharge: Patient and daughter Resource referrals recommended: Support group (specify)  Discharge Planning Living Arrangements: Spouse/significant other, Other relatives Support Systems: Spouse/significant other, Children, Other relatives, Friends/neighbors, Church/faith community Type of Residence: Private residence Insurance Resources: [employer], Multimedia programmer (specify)(AARP) Museum/gallery curator Resources: Fish farm manager, Family Support Financial Screen Referred: No Living Expenses: Lives with family Money Management: Patient Does the patient have any problems obtaining your medications?: No Home Management: pt and daughter helps wiht the cleaning, pt does the cooking Patient/Family Preliminary Plans: Return home with wife and mother in-law who he supervises. His goals are independent with a device so he should be able to resume his caregiving duties. He will not be able to drive and daughter plans to do this for them until pt can resume. Will work on a safe discharge plan. Sw Barriers to Discharge: Decreased caregiver support Sw Barriers to Discharge Comments: Pt is the caregiver for wife and mother in-law who are home bound Social Work Anticipated Follow Up Needs: HH/OP, Support Group  Clinical Impression Pleasant gentleman who seems to take everything in stride, he is glad to be doing so well and recovering from his stroke. He has a supportive daughter and church members who will assist intermittently if needed. He is the caregiver from his wife and mother in-law and reports  they all manage together. Aware of his short length of stay 7 days and he is agreeable to this plan. Work on discharge needs.  Elease Hashimoto 05/30/2018, 9:23 AM

## 2018-05-30 NOTE — Care Management Note (Signed)
[facility] Individual Statement of Services  Patient Name:  [name]  Date:  05/30/2018  Welcome to the [facility].  Our goal is to provide you with an individualized program based on your diagnosis and situation, designed to meet your specific needs.  With this comprehensive rehabilitation program, you will be expected to participate in at least 3 hours of rehabilitation therapies Monday-Friday, with modified therapy programming on the weekends.  Your rehabilitation program will include the following services:  Physical Therapy (PT), Occupational Therapy (OT), 24 hour per day rehabilitation nursing, Case Management (Social Worker), Rehabilitation Medicine, Nutrition Services and Pharmacy Services  Weekly team conferences will be held on Wednesday to discuss your progress.  Your Social Worker will talk with you frequently to get your input and to update you on team discussions.  Team conferences with you and your family in attendance may also be held.  Expected length of stay: 5-7 days  Overall anticipated outcome: Independent with device  Depending on your progress and recovery, your program may change. Your Social Worker will coordinate services and will keep you informed of any changes. Your Social Worker's name and contact numbers are listed  below.  The following services may also be recommended but are not provided by the [facility] will be made to provide these services after discharge if needed.  Arrangements include referral to agencies that provide these services.  Your insurance has been verified to be:  Medicare & [place] Your primary doctor is:  Suzan Garibaldi  Pertinent information will be shared with your doctor and your insurance company.  Social Worker:  Ovidio Kin, [place] or ([phone]  Information discussed with and copy given to patient by: Elease Hashimoto, 05/30/2018, 8:46 AM

## 2018-05-31 ENCOUNTER — Inpatient Hospital Stay (HOSPITAL_COMMUNITY): Payer: Medicare Other | Admitting: Physical Therapy

## 2018-05-31 ENCOUNTER — Inpatient Hospital Stay (HOSPITAL_COMMUNITY): Payer: Medicare Other | Admitting: Occupational Therapy

## 2018-05-31 LAB — CBC
HCT: 24.9 % — ABNORMAL LOW (ref 39.0–52.0)
Hemoglobin: 7.8 g/dL — ABNORMAL LOW (ref 13.0–17.0)
MCH: 28.1 pg (ref 26.0–34.0)
MCHC: 31.3 g/dL (ref 30.0–36.0)
MCV: 89.6 fL (ref 80.0–100.0)
Platelets: 318 10*3/uL (ref 150–400)
RBC: 2.78 MIL/uL — ABNORMAL LOW (ref 4.22–5.81)
RDW: 18.6 % — ABNORMAL HIGH (ref 11.5–15.5)
WBC: 4.4 10*3/uL (ref 4.0–10.5)
nRBC: 0 % (ref 0.0–0.2)

## 2018-05-31 LAB — GLUCOSE, CAPILLARY
Glucose-Capillary: 109 mg/dL — ABNORMAL HIGH (ref 70–99)
Glucose-Capillary: 186 mg/dL — ABNORMAL HIGH (ref 70–99)
Glucose-Capillary: 141 mg/dL — ABNORMAL HIGH (ref 70–99)
Glucose-Capillary: 137 mg/dL — ABNORMAL HIGH (ref 70–99)

## 2018-05-31 MED ORDER — METFORMIN HCL 500 MG PO TABS: 500.0000 mg | ORAL_TABLET | Freq: Two times a day (BID) | ORAL | 0 refills | Status: DC

## 2018-05-31 MED ORDER — CLOPIDOGREL BISULFATE 75 MG PO TABS: 75.0000 mg | ORAL_TABLET | Freq: Every day | ORAL | 3 refills | Status: DC

## 2018-05-31 MED ORDER — AMLODIPINE BESYLATE 10 MG PO TABS: 10.0000 mg | ORAL_TABLET | Freq: Every evening | ORAL | 0 refills | Status: DC

## 2018-05-31 MED ORDER — ACETAMINOPHEN 325 MG PO TABS: 650.0000 mg | ORAL_TABLET | Freq: Four times a day (QID) | ORAL | Status: AC | PRN

## 2018-05-31 MED ORDER — FUROSEMIDE 20 MG PO TABS: 20.0000 mg | ORAL_TABLET | Freq: Every day | ORAL | 0 refills | Status: DC

## 2018-05-31 MED ORDER — AMLODIPINE BESYLATE 10 MG PO TABS
10.0000 mg | ORAL_TABLET | Freq: Every day | ORAL | Status: DC
  Administered 2018-06-01: 10 mg via ORAL
  Filled 2018-05-31: qty 1

## 2018-05-31 MED ORDER — ISOSORBIDE MONONITRATE ER 60 MG PO TB24: 60.0000 mg | ORAL_TABLET | Freq: Two times a day (BID) | ORAL | 0 refills | Status: DC

## 2018-05-31 MED ORDER — ROSUVASTATIN CALCIUM 20 MG PO TABS: 20.0000 mg | ORAL_TABLET | Freq: Every day | ORAL | 1 refills | Status: DC

## 2018-05-31 MED ORDER — DOXAZOSIN MESYLATE 1 MG PO TABS: 1.0000 mg | ORAL_TABLET | Freq: Every day | ORAL | 0 refills | Status: DC

## 2018-05-31 NOTE — Discharge Instructions (Signed)
Inpatient Rehab Discharge Instructions  VERDIS KOVAL Discharge date and time: No discharge date for patient encounter.   Activities/Precautions/ Functional Status: Activity: activity as tolerated Diet: diabetic diet Wound Care: none needed Functional status:  ___ No restrictions     ___ Walk up steps independently ___ 24/7 supervision/assistance   ___ Walk up steps with assistance ___ Intermittent supervision/assistance  ___ Bathe/dress independently ___ Walk with walker     _x__ Bathe/dress with assistance ___ Walk Independently    ___ Shower independently ___ Walk with assistance    ___ Shower with assistance ___ No alcohol     ___ Return to work/school ________  Special Instructions: No driving  Follow-up Dr. Estanislado Pandy interventional radiology 06/06/2018 for outpatient angiogram possible need for angioplasty   COMMUNITY REFERRALS UPON DISCHARGE:    Home Health:   [facility]   Date of last service:06/01/2018  Medical Equipment/Items Ordered:HAS ALL NEEDED EQUIPMENT    GENERAL COMMUNITY RESOURCES FOR PATIENT/FAMILY: Support Groups:[facility] SUPPORT GROUP THE SECOND Thursday @ 6:00-7:00 PM ON THE REHAB UNIT QUESTIONS CONTACT AMY [phone]  STROKE/TIA DISCHARGE INSTRUCTIONS SMOKING Cigarette smoking nearly doubles your risk of having a stroke & is the single most alterable risk factor  If you smoke or have smoked in the last 12 months, you are advised to quit smoking for your health.  Most of the excess cardiovascular risk related to smoking disappears within a year of stopping.  Ask you doctor about anti-smoking medications  [facility] Quit Line: 1-800-QUIT NOW  Free Smoking Cessation Classes [phone]  CHOLESTEROL Know your levels; limit fat & cholesterol in your diet  Lipid Panel     Component Value Date/Time   CHOL 140 04/27/2018 0932   TRIG 101 04/27/2018 0932   HDL 39 (L) 04/27/2018 0932   CHOLHDL 3.6 04/27/2018 0932   CHOLHDL 3.5 04/18/2018 0136   VLDL 15 04/18/2018 0136   LDLCALC 81 04/27/2018 0932      Many patients benefit from treatment even if their cholesterol is at goal.  Goal: Total Cholesterol (CHOL) less than 160  Goal:  Triglycerides (TRIG) less than 150  Goal:  HDL greater than 40  Goal:  LDL (LDLCALC) less than 100   BLOOD PRESSURE American Stroke Association blood pressure target is less that 120/80 mm/Hg  Your discharge blood pressure is:  BP: (!) 179/68  Monitor your blood pressure  Limit your salt and alcohol intake  Many individuals will require more than one medication for high blood pressure  DIABETES (A1c is a blood sugar average for last 3 months) Goal HGBA1c is under 7% (HBGA1c is blood sugar average for last 3 months)  Diabetes:     Lab Results  Component Value Date   HGBA1C 8.2 (H) 04/17/2018     Your HGBA1c can be lowered with medications, healthy diet, and exercise.  Check your blood sugar as directed by your physician  Call your physician if you experience unexplained or low blood sugars.  PHYSICAL ACTIVITY/REHABILITATION Goal is 30 minutes at least 4 days per week  Activity: Increase activity slowly, Therapies: Physical Therapy: Home Health Return to work:   Activity decreases your risk of heart attack and stroke and makes your heart stronger.  It helps control your weight and blood pressure; helps you relax and can improve your mood.  Participate in a regular exercise program.  Talk with your doctor about the best form of exercise for you (dancing, walking, swimming, cycling).  DIET/WEIGHT  Goal is to maintain a healthy weight  Your discharge diet is:  Diet Order            Diet heart healthy/carb modified Room service appropriate? Yes; Fluid consistency: Thin  Diet effective now              liquids Your height is:  Height: 6\' 1"  (185.4 cm) Your current weight is: Weight: 95.4 kg Your Body Mass Index (BMI) is:  BMI (Calculated): 27.75   Following the type of diet specifically designed for you will help prevent another stroke.  Your goal weight range is:    Your goal Body Mass Index (BMI) is 19-24.  Healthy food habits can help reduce 3 risk factors for stroke:  High cholesterol, hypertension, and excess weight.  RESOURCES Stroke/Support Group:  Call [phone]   STROKE EDUCATION PROVIDED/REVIEWED AND GIVEN TO PATIENT Stroke warning signs and symptoms How to activate emergency medical system (call 911). Medications prescribed at discharge. Need for follow-up after discharge. Personal risk factors for stroke. Pneumonia vaccine given:  Flu vaccine given:  My questions have been answered, the writing is legible, and I understand these instructions.  I will adhere to these goals & educational materials that have been provided to me after my discharge from the hospital.      My questions have been answered and I understand these instructions. I will adhere to these goals and the provided educational materials after my discharge from the hospital.  Patient/Caregiver Signature _______________________________ Date __________  Clinician Signature _______________________________________ Date __________  Please bring this form and your medication list with you to all your follow-up doctor's appointments.

## 2018-05-31 NOTE — Progress Notes (Signed)
Occupational Therapy Discharge Summary  Patient Details  Name: [name] MRN: [id number] Date of Birth: 06/04/1930   Patient has met 10 of 10 long term goals due to improved activity tolerance, improved balance, ability to compensate for deficits, functional use of  RIGHT upper extremity and improved awareness.  Patient to discharge at overall Modified Independent level.  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  Pt has demonstrated and verbalized safety with IADLs and reports plan to call for assist if either of his family members requires physical assistance that he cannot provide.  Reasons goals not met: N/A  Recommendation:  Patient will not require any follow up OT at this time.  Equipment: No equipment provided  Reasons for discharge: treatment goals met and discharge from hospital  Patient/family agrees with progress made and goals achieved: Yes  OT Discharge Precautions/Restrictions   Fall Vital Signs Therapy Vitals Temp: (!) 97.5 F (36.4 C) Temp Source: Oral Pulse Rate: (!) 53 Resp: 16 BP: (!) 156/67 Patient Position (if appropriate): Lying Oxygen Therapy SpO2: 98 % O2 Device: Room Air Pain Pain Assessment Pain Score: 0-No pain ADL ADL Eating: Independent Grooming: Independent Where Assessed-Grooming: Standing at sink Upper Body Bathing: Modified independent Where Assessed-Upper Body Bathing: Shower Lower Body Bathing: Modified independent Where Assessed-Lower Body Bathing: Shower Upper Body Dressing: Independent Where Assessed-Upper Body Dressing: Other (Comment)(standing beside toilet) Lower Body Dressing: Modified independent Where Assessed-Lower Body Dressing: Sitting at sink, Standing at sink Toileting: Modified independent Where Assessed-Toileting: Glass blower/designer: Diplomatic Services operational officer Method: Counselling psychologist: Sales promotion account executive: Modified independent Social research officer, government Method: Heritage manager: Civil engineer, contracting with back Vision Baseline Vision/History: Wears glasses Wears Glasses: At all times Patient Visual Report: No change from baseline Vision Assessment?: No apparent visual deficits Perception  Perception: Within Functional Limits Praxis Praxis: Intact Cognition Overall Cognitive Status: No family/caregiver present to determine baseline cognitive functioning Arousal/Alertness: Awake/alert Orientation Level: Oriented X4 Memory: Appears intact Awareness: Appears intact Problem Solving: Appears intact Safety/Judgment: Appears intact Sensation Sensation Light Touch: Impaired Detail Light Touch Impaired Details: Impaired RUE(diminished sensation in hands/fingers) Hot/Cold: Appears Intact Proprioception: Impaired by gross assessment;Impaired Detail Proprioception Impaired Details: Impaired RUE Coordination Gross Motor Movements are Fluid and Coordinated: No(impaired RUE) Fine Motor Movements are Fluid and Coordinated: No(impaired RUE) Coordination and Movement Description: Decreased R UE gross/fine motor control and accuracy/smoothness of movement Finger Nose Finger Test: Dysmetria Rt, WNL Lt 9 Hole Peg Test: Lt: 29 seconds, Rt: Unable to pick up pegs from flat therefore modified task to therapist holding peg pt able to complete in 2:08 Extremity/Trunk Assessment RUE Assessment RUE Assessment: Exceptions to [facility] General Strength Comments: Deficits with gross and fine motor control. Decreased accuracy and smoothness of movement. Brunnstrom Stage 4 LUE Assessment LUE Assessment: Within Functional Limits   Latasha Buczkowski, [facility] 05/31/2018, 5:07 PM

## 2018-05-31 NOTE — Progress Notes (Signed)
Occupational Therapy Session Note  Patient Details  Name: [name] MRN: [id number] Date of Birth: September 12, 1930  Today's Date: 05/31/2018 OT Individual Time: 1500-1600 OT Individual Time Calculation (min): 60 min    Short Term Goals: Week 1:  OT Short Term Goal 1 (Week 1): STGs=LTGs due to ELOS  Skilled Therapeutic Interventions/Progress Updates:    Completed ADL retraining at overall Mod I level. Pt ambulated around room and gathered supplies prior to bathing in room shower.  Pt completed bathing and dressing at sit > stand level at Mod I and completed all grooming tasks in standing.  Ambulated to therapy gym without AD at Mod I level.  Engaged in 9 hole peg test and opening pill bottles.  Pt required increased time and therapist to stabilize pegs while pt picked them up with RUE due to decreased sensation.  Discussed IADLs with pt able to report previous techniques and no concerns about returning to prior tasks.  Pt returned to room and made Independent in room - RN aware.  Therapy Documentation Precautions:  Precautions Precautions: Fall Precaution Comments: Rt hemi Restrictions Weight Bearing Restrictions: No General:   Vital Signs: Therapy Vitals Temp: (!) 97.5 F (36.4 C) Temp Source: Oral Pulse Rate: (!) 53 Resp: 16 BP: (!) 156/67 Patient Position (if appropriate): Lying Oxygen Therapy SpO2: 98 % O2 Device: Room Air Pain: Pain Assessment Pain Score: 0-No pain ADL: ADL Eating: Independent Grooming: Independent Where Assessed-Grooming: Standing at sink Upper Body Bathing: Modified independent Where Assessed-Upper Body Bathing: Shower Lower Body Bathing: Modified independent Where Assessed-Lower Body Bathing: Shower Upper Body Dressing: Independent Where Assessed-Upper Body Dressing: Other (Comment)(standing beside toilet) Lower Body Dressing: Modified independent Where Assessed-Lower Body Dressing: Sitting at sink, Standing at sink Toileting: Modified  independent Where Assessed-Toileting: Glass blower/designer: Diplomatic Services operational officer Method: Counselling psychologist: Energy manager: Chief Financial Officer Method: Heritage manager: Civil engineer, contracting with back   Therapy/Group: Individual Therapy  Simonne Come 05/31/2018, 4:57 PM

## 2018-05-31 NOTE — Plan of Care (Signed)
  Problem: RH KNOWLEDGE DEFICIT Goal: RH STG INCREASE KNOWLEDGE OF HYPERTENSION Description Patient will demonstrate use of hypertensive medication and low salt-low fat diet; acknowledge signs and symptoms of elevated blood pressure; demonstrates use of equipment to self monitor blood pressure; demonstrates understanding of handouts related to hypertension independently;   Outcome: Progressing Goal: RH STG INCREASE KNOWLEGDE OF HYPERLIPIDEMIA Description Patient demonstrates knowledge of diet and medication related to management of low fat and low cholesterol diet independently   Outcome: Progressing   Problem: RH KNOWLEDGE DEFICIT Goal: RH STG INCREASE KNOWLEDGE OF DIABETES Description Patient will verbalize management of diabetes; demonstrates use of diabetes equipment; demonstrates knowledge of medication; demonstrates knowledge of diet restrictions and carb limitations; using handouts and using education independently  Outcome: Completed/Met

## 2018-05-31 NOTE — Progress Notes (Addendum)
Physical Therapy Session Note  Patient Details  Name: [name] MRN: [id number] Date of Birth: 11/07/1930  Today's Date: 05/31/2018 PT Individual Time: 8299-3716 and 9678-9381 PT Individual Time Calculation (min): 86 min and 61 min  Short Term Goals: Week 1:  PT Short Term Goal 1 (Week 1): STG=LTG due to ELOS   Skilled Therapeutic Interventions/Progress Updates:   Treatment 1:  Pt received in bed & agreeable to tx. Pt dons shoes independently and changes to leg catheter with min assist to manage hospital catheter bag while putting on leg bag. Pt ambulates from room to gym independently and performs car transfer in sedan-sized simulated vehicle independently. Pt walks on uneven surface for 10 ft independently and steps off of curb without use of handrails with close supervision. Pt ambulates to apartment and performs bed mobility and couch>stand transfer independently. Pt also practices removing pillow cases on bed and putting on new ones to simulate daily home tasks. Pt ambulates to gym stairs and takes 12 stairs with 1 handrail on left side independently. Berg Balance Test is assessed with score of 46/56 indicating a moderate fall risk. Pt performs PNF patterns with BUE and a 4 kg weighted ball with close supervision 2 x 10 times each direction. Pt performs obstacle course with step ups, uneven surface, and step-over cones with CGA 2 times; and pt helps pick up objects used for obstacle course to put back on shelves to simulate daily home tasks. Pt ambulates from gym to room independently and left in recliner with chair alarm donned and all needs in reach.   No complaints of pain during session.  Treatment 2: Pt received in recliner & agreeable to tx. Pt performs toileting tasks of emptying catheter bag independently. Pt ambulates around the unit independently. Pt uses zoomball for RUE/LUE coordination and RUE strengthening and NMR. Pt balances with feet apart on rocker board in sagittal plane  and frontal plane with eyes open and closed and with perturbations with minimal use of UE on parallel bars and CGA. Pt utilizes kinetron with BUE>1UE 2 x 10 to challenge balance and LE strengthening; pt with c/o pain in knee during task even with reduction in resistance so transitioned to different activity for pain management. Dynavision utilized in standing on foam for extra balance challenge with focus on RUE strengthening and NMR for fine motor control of pressing buttons with index finger. Pt practices opening and closing 12 pill bottles for fine motor control with RUE. Pt utilizes Nustep for 6 minutes on level 5 for UE/LE coordination and trunk rotation. Pt left lying down in bed with bed alarm on and all needs in reach.   Pt complains of achy "grinding" knee pain following kinetron and rest break provided but declines medication.   Therapy Documentation Precautions:  Precautions Precautions: Fall Precaution Comments: Rt hemi Restrictions Weight Bearing Restrictions: No  Balance: Balance Balance Assessed: Yes Standardized Balance Assessment Standardized Balance Assessment: Berg Balance Test Berg Balance Test Sit to Stand: Able to stand without using hands and stabilize independently Standing Unsupported: Able to stand safely 2 minutes Sitting with Back Unsupported but Feet Supported on Floor or Stool: Able to sit safely and securely 2 minutes Stand to Sit: Sits safely with minimal use of hands Transfers: Able to transfer safely, definite need of hands Standing Unsupported with Eyes Closed: Able to stand 10 seconds safely Standing Ubsupported with Feet Together: Able to place feet together independently and stand 1 minute safely From Standing, Reach Forward with Outstretched  Arm: Can reach forward >12 cm safely (5") From Standing Position, Pick up Object from Floor: Able to pick up shoe safely and easily From Standing Position, Turn to Look Behind Over each Shoulder: Looks behind one  side only/other side shows less weight shift Turn 360 Degrees: Able to turn 360 degrees safely but slowly Standing Unsupported, Alternately Place Feet on Step/Stool: Able to complete 4 steps without aid or supervision Standing Unsupported, One Foot in Front: Able to plae foot ahead of the other independently and hold 30 seconds Standing on One Leg: Able to lift leg independently and hold equal to or more than 3 seconds Total Score: 46   Therapy/Group: Individual Therapy  Coral Spikes 05/31/2018, 12:42 PM      I have read and reviewed the following documentation and am in agreement with the information.  This licensed clinician was present and actively directing the care throughout the session at all times.  Lavone Nian, PT, DPT

## 2018-05-31 NOTE — Progress Notes (Signed)
Social Work  Discharge Note  The overall goal for the admission was met for:   Discharge location: Yes-HOME WITH WIFE AND MOTHER IN-[place] ALONG WITH DAUGHTER CHECKING IN ON THEM  Length of Stay: Yes-5 DAYS  Discharge activity level: Yes-MOD/I LEVEL  Home/community participation: Yes  Services provided included: MD, RD, PT, OT, RN, CM, Pharmacy and SW  Financial Services: Medicare and Private Insurance: [place]  Follow-up services arranged: Home Health: [facility]-PT & OT and Patient/Family has no preference for HH/DME agencies  Comments (or additional information):PT DID WELL AND REACHED MOD/I LEVEL GOALS. HE IS THE CAREGIVER FOR HIS WIFE AND MOTHER IN-LA[place]-SUPERVISES THEM. DAUGHTER AND FRIENDS TO CHECK DAILY AND ASSIST ALL OF THEM.  Patient/Family verbalized understanding of follow-up arrangements: Yes  Individual responsible for coordination of the follow-up plan: SELF & [name]-DAUGHTER  Confirmed correct DME delivered: Elease Hashimoto 05/31/2018    Elease Hashimoto

## 2018-05-31 NOTE — Progress Notes (Signed)
[place] PHYSICAL MEDICINE & REHABILITATION PROGRESS NOTE  Subjective/Complaints:    ROS: Denies CP, shortness of breath, nausea, vomiting, diarrhea.  Objective: Vital Signs: Blood pressure (!) 176/71, pulse (!) 45, temperature 97.6 F (36.4 C), temperature source Oral, resp. rate 18, height 6\' 1"  (1.854 m), weight 95.4 kg, SpO2 97 %. No results found. Recent Labs    05/30/18 0703  WBC 4.4  HGB 7.4*  HCT 23.7*  PLT 295   Recent Labs    05/30/18 0703  NA 138  K 3.8  CL 107  CO2 24  GLUCOSE 111*  BUN 24*  CREATININE 1.43*  CALCIUM 8.3*    Physical Exam: BP (!) 176/71 (BP Location: Left Arm) Comment: rn notified   Pulse (!) 45 Comment: rn notified  Temp 97.6 F (36.4 C) (Oral)   Resp 18   Ht 6\' 1"  (1.854 m)   Wt 95.4 kg   SpO2 97%   BMI 27.75 kg/m  Constitutional: No distress . Vital signs reviewed. HENT: Normocephalic.  Atraumatic. Eyes: EOMI. No discharge. Cardiovascular: + Bradycardia.  Regular rhythm. No JVD. Respiratory: CTA bilaterally.  Normal effort. GI: BS +. Non-distended. Musc: No edema or tenderness in extremities. Genitourinary: Suprapubic cath in place Neurological: Alert Follows full commands.  Fair awareness of deficits.  Motor: LUE/LLE 4+/5.  RUE/RLE: 4/5 proximal to distal, with decreased [place] Skin: Warm and dry.  Intact.  Assessment/Plan: 1. Functional deficits secondary to left MCA infarct which require 3+ hours per day of interdisciplinary therapy in a comprehensive inpatient rehab setting.  Physiatrist is providing close team supervision and 24 hour management of active medical problems listed below.  Physiatrist and rehab team continue to assess barriers to discharge/monitor patient progress toward functional and medical goals  Care Tool:  Bathing    Body parts bathed by patient: Right arm, Left arm, Chest, Face, Abdomen, Front perineal area, Buttocks, Right upper leg, Left upper leg, Right lower leg, Left lower leg          Bathing assist Assist Level: Supervision/Verbal cueing     Upper Body Dressing/Undressing Upper body dressing   What is the patient wearing?: Pull over shirt    Upper body assist Assist Level: Supervision/Verbal cueing    Lower Body Dressing/Undressing Lower body dressing      What is the patient wearing?: Underwear/pull up, Pants     Lower body assist Assist for lower body dressing: Supervision/Verbal cueing     Toileting Toileting    Toileting assist Assist for toileting: Supervision/Verbal cueing     Transfers Chair/bed transfer  Transfers assist     Chair/bed transfer assist level: Independent     Locomotion Ambulation   Ambulation assist      Assist level: Supervision/Verbal cueing   Max distance: 1000 ft   Walk 10 feet activity   Assist     Assist level: Supervision/Verbal cueing     Walk 50 feet activity   Assist    Assist level: Supervision/Verbal cueing      Walk 150 feet activity   Assist    Assist level: Supervision/Verbal cueing      Walk 10 feet on uneven surface  activity   Assist     Assist level: Contact Guard/Touching assist     Wheelchair     Assist Will patient use wheelchair at discharge?: No   Wheelchair activity did not occur: N/A         Wheelchair 50 feet with 2 turns activity    Assist  Wheelchair 50 feet with 2 turns activity did not occur: N/A       Wheelchair 150 feet activity     Assist Wheelchair 150 feet activity did not occur: N/A          Medical Problem List and Plan: 1.Left side weaknesssecondary to left MCA infarction, high-grade left MCA M1 stenosis.Plan outpatient cerebral angiogram question angioplasty stenting 06/06/2018 at 7:30 AM per interventional radiology  Continue CIR PT, OT- plan d/c in am 2. DVT Prophylaxis/Anticoagulation: Subcutaneous Lovenox. Monitor for any bleeding episodes 3. Pain Management:Tylenol as needed 4.  Mood:Provide emotional support 5. Neuropsych: This patientiscapable of making decisions on hisown behalf. 6. Skin/Wound Care:Routine skin checks 7. Fluids/Electrolytes/Nutrition:Routine ins and outs 8. History of CAD with CABG. No chest pain or shortness of breath 9. Diastolic congestive heart failure. Monitor for signs of fluid overload. Patient on Lasix 20 mg daily prior to admission. Resume as needed Filed Weights   05/27/18 1139  Weight: 95.4 kg  10. Diabetes mellitus peripheral neuropathy. Hemoglobin A1c 8.2. SSI. Patient Amaryl 4 mg twice a day, Glucophage 1000 mg twice a day prior to admission. Resume as needed  Monitor with increased mobility 11. Hypertension. Permissive hypertension. Patient on Norvasc 10 mg daily, Coreg 3.125 mg every a.m. and 6.25 mg every p.m.,Imdur 60 mg twice a day, hydralazine 100 mg twice a dayprior to admission and resume as needed  Norvasc 5 started on 1/12- resume home dose to 10mg    Vitals:   05/30/18 2010 05/31/18 0527  BP: (!) 182/70 (!) 176/71  Pulse: (!) 52 (!) 45  Resp: (!) 21 18  Temp:  97.6 F (36.4 C)  SpO2: 99% 97%  elevated , will monitor with med changes  Monitor with increased mobility 12. Chronic renal insufficiency. Baseline creatinine 1.41.  Creatinine 1.45 on 1/10, stable at 1.43 1/13   Continue to monitor 13. Hyperlipidemia. Crestor 14. Chronic anemia. Continue iron supplement  Hemoglobin 7.9 on 1/10  Stable at 7.4 , will order stool guaic, pt has had work up for this "Drs haven't found anything" Recheck today, pt is Jehovah's Witness no blood products, may need EPO pre op if further drop <7.0  Continue to monitor  LOS: 4 days A FACE TO FACE EVALUATION WAS PERFORMED  Charlett Blake 05/31/2018, 8:19 AM

## 2018-05-31 NOTE — Discharge Summary (Signed)
NAME: [name], ODRISCOLL MEDICAL RECORD KN:[id number] ACCOUNT [id number] DATE OF BIRTH:Feb 01, 1931 FACILITY: [facility] LOCATION: [facility] PHYSICIAN:ANDREW Letta Pate, MD  DISCHARGE SUMMARY  DATE OF DISCHARGE:  06/01/2018  DISCHARGE DIAGNOSES: 1.  Left middle cerebral artery infarction with high grade left middle cerebral artery M1 stenosis. 2.  Subcutaneous Lovenox for deep venous thrombosis prophylaxis.   3.  History of coronary artery disease with coronary artery bypass graft. 4.  Diastolic congestive heart failure. 5.  Diabetes mellitus. 6.  Peripheral neuropathy. 7.  Hypertension. 8.  Chronic renal insufficiency. 9.  Hyperlipidemia. 10.  Chronic anemia.  HISTORY OF PRESENT ILLNESS:  This is an 85 year old right-handed male with history of CAD with CABG, diastolic congestive heart failure.  Suprapubic catheter x1 year.  Diabetes mellitus, CVA 2016 with right-sided weakness maintained on aspirin and  Plavix, previous balloon angioplasty of left ICA, petrous segment 01/25/2015,  and also history of chronic renal insufficiency.  He lives with spouse, independent and driving.  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  He has a daughter in the  area.  Presented 05/24/2018 with right-sided weakness.  Cranial CT scan unremarkable for acute intracranial process.  Per report stable small chronic left frontoparietal infarction.  CT angiogram of head and neck with no emergent large vessel occlusion,  60% stenosis of proximal left ICA and severe stenosis of distal left M1 segment with preserved distal patency.  MRI of the brain, multifocal acute ischemia within the left MCA territory as well as additional small focus of ischemia in the right frontal  white matter.  Carotid Dopplers with no ICA stenosis.  Echocardiogram with ejection fraction of 55%, no wall motion abnormality, grade I diastolic dysfunction.  Maintained on aspirin and Plavix for CVA prophylaxis.  Subcutaneous Lovenox for DVT   prophylaxis.  Interventional radiology consulted 05/26/2018 after CT angiogram of head and neck reviewed.  Current plan for outpatient cerebral angiogram.  Question need for angioplasty stenting 06/06/2018, arrival time 7:30 a.m.  The patient was  admitted for comprehensive rehabilitation program.  PAST MEDICAL HISTORY:  See discharge diagnoses.  SOCIAL HISTORY:  Lives with spouse, independent prior to admission.  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  FUNCTIONAL STATUS:  Upon admission to rehab services was minimal guard 200 feet without assistive device, minimal guard sit to stand, min mod assist ADLs.  PHYSICAL EXAMINATION: VITAL SIGNS:  Blood pressure 150/62, pulse 54, temperature 98, respirations 18. GENERAL:  Alert male in no acute distress. HEENT:  EOMs intact. NECK:  Supple, nontender, no JVD. CARDIOVASCULAR:  Rate controlled. ABDOMEN:  Soft, nontender, good bowel sounds. LUNGS:  Clear to auscultation without wheeze.  Suprapubic tube in place.  REHABILITATION HOSPITAL COURSE:  The patient was admitted to inpatient rehabilitation services.  Therapies initiated on a 3-hour daily basis, consisting of physical therapy, occupational therapy and rehabilitation nursing.  The following issues were  addressed:    Pertaining to the patient's left MCA infarction, he would remain on aspirin and Plavix therapy.  Follow up neurology services.  High grade left MCA M1 stenosis.  Plan for outpatient cerebral angiogram.  for stenting 06/06/2018 at 7:30 a.m. with  interventional radiology.    Subcutaneous Lovenox for DVT prophylaxis.  No bleeding episodes.  No chest pain or shortness of breath.  He exhibited no other signs of fluid overload.    Blood sugars well controlled.  Hemoglobin A1c 8.2.  Remained on Glucophage as advised.  His Amaryl could be resumed as needed.  Blood pressure is overall controlled on Norvasc and monitored closely with permissive hypertension with  antihypertensive medications slowly resumed.  Chronic renal insufficiency.  Creatinine 1.45.    The patient received weekly collaborative interdisciplinary team conferences to discuss estimated length of stay, family teaching, any barriers to discharge.  Ambulates to the rehab gym distant supervision.  Performs ladder drills, forward side stepping  in and out of squares supervision, contact guard overall for stairs.  Gathers belongings for activities of daily living and homemaking. Plan discharge to home.  DISCHARGE MEDICATIONS:  Included Norvasc 10 mg daily, aspirin 81 mg p.o. daily, Plavix 75 mg p.o. daily, ferrous sulfate 325 mg p.o. b.i.d., Glucophage 500 mg p.o. b.i.d., Crestor 20 mg p.o. daily, vitamin D every 7 days.  His Coreg could be resumed, 6.25  mg one half tablet every morning and 1 tablet every evening, Cardura 1 mg daily, Lasix 20 mg p.o. daily, Imdur 60 mg p.o. b.i.d., nitroglycerin as needed.  DIET:  Diabetic diet.  FOLLOWUP:  The patient would follow up with Dr. Alysia Penna at the outpatient rehab center as advised; Dr. Antony Contras, call for appointment; Dr. Estanislado Pandy, interventional radiology for a planned outpatient angiogram, possible angioplasty  06/06/2018.  Dr. Glenetta Hew, cardiology services;  Suzan Garibaldi, [place], [facility] medical management.  AN/NUANCE D:05/31/2018 T:05/31/2018 JOB:[id number]

## 2018-06-01 LAB — GLUCOSE, CAPILLARY
Glucose-Capillary: 112 mg/dL — ABNORMAL HIGH (ref 70–99)
Glucose-Capillary: 151 mg/dL — ABNORMAL HIGH (ref 70–99)

## 2018-06-01 NOTE — Progress Notes (Signed)
Pt. Got d/c papers and instructions,pt. Is ready to go home with family.

## 2018-06-01 NOTE — Progress Notes (Signed)
[place] PHYSICAL MEDICINE & REHABILITATION PROGRESS NOTE  Subjective/Complaints:  Mod I in room without difficulty  ROS: Denies CP, shortness of breath, nausea, vomiting, diarrhea.  Objective: Vital Signs: Blood pressure (!) 156/67, pulse (!) 53, temperature (!) 97.5 F (36.4 C), temperature source Oral, resp. rate 16, height 6\' 1"  (1.854 m), weight 95.4 kg, SpO2 98 %. No results found. Recent Labs    05/30/18 0703 05/31/18 0900  WBC 4.4 4.4  HGB 7.4* 7.8*  HCT 23.7* 24.9*  PLT 295 318   Recent Labs    05/30/18 0703  NA 138  K 3.8  CL 107  CO2 24  GLUCOSE 111*  BUN 24*  CREATININE 1.43*  CALCIUM 8.3*    Physical Exam: BP (!) 156/67 (BP Location: Left Arm)   Pulse (!) 53   Temp (!) 97.5 F (36.4 C) (Oral)   Resp 16   Ht 6\' 1"  (1.854 m)   Wt 95.4 kg   SpO2 98%   BMI 27.75 kg/m  Constitutional: No distress . Vital signs reviewed. HENT: Normocephalic.  Atraumatic. Eyes: EOMI. No discharge. Cardiovascular: + Bradycardia.  Regular rhythm. No JVD. Respiratory: CTA bilaterally.  Normal effort. GI: BS +. Non-distended. Musc: No edema or tenderness in extremities. Genitourinary: Suprapubic cath in place Neurological: Alert Follows full commands.  Fair awareness of deficits.  Motor: LUE/LLE 4+/5.  RUE/RLE: 4/5 proximal to distal, with decreased [place] Skin: Warm and dry.  Intact.  Assessment/Plan: 1. Functional deficits secondary to left MCA infarct Stable for D/C today F/u PCP in 3-4 weeks F/u PM&R 2 weeks See D/C summary See D/C instructions Care Tool:  Bathing    Body parts bathed by patient: Right arm, Left arm, Chest, Face, Abdomen, Front perineal area, Buttocks, Right upper leg, Left upper leg, Right lower leg, Left lower leg         Bathing assist Assist Level: Independent with assistive device     Upper Body Dressing/Undressing Upper body dressing   What is the patient wearing?: Pull over shirt    Upper body assist Assist Level:  Independent    Lower Body Dressing/Undressing Lower body dressing      What is the patient wearing?: Underwear/pull up, Pants     Lower body assist Assist for lower body dressing: Independent with assitive device     Toileting Toileting    Toileting assist Assist for toileting: Independent with assistive device     Transfers Chair/bed transfer  Transfers assist     Chair/bed transfer assist level: Independent     Locomotion Ambulation   Ambulation assist      Assist level: Independent   Max distance: 150 ft   Walk 10 feet activity   Assist     Assist level: Independent     Walk 50 feet activity   Assist    Assist level: Independent      Walk 150 feet activity   Assist    Assist level: Independent      Walk 10 feet on uneven surface  activity   Assist     Assist level: Independent     Wheelchair     Assist Will patient use wheelchair at discharge?: No   Wheelchair activity did not occur: N/A         Wheelchair 50 feet with 2 turns activity    Assist    Wheelchair 50 feet with 2 turns activity did not occur: N/A       Wheelchair 150 feet activity  Assist Wheelchair 150 feet activity did not occur: N/A          Medical Problem List and Plan: 1.Left side weaknesssecondary to left MCA infarction, high-grade left MCA M1 stenosis.Plan outpatient cerebral angiogram question angioplasty stenting 06/06/2018 at 7:30 AM per interventional radiology  Continue CIR PT, OT- plan d/c today 2. DVT Prophylaxis/Anticoagulation: Subcutaneous Lovenox. Monitor for any bleeding episodes 3. Pain Management:Tylenol as needed 4. Mood:Provide emotional support 5. Neuropsych: This patientiscapable of making decisions on hisown behalf. 6. Skin/Wound Care:Routine skin checks 7. Fluids/Electrolytes/Nutrition:Routine ins and outs 8. History of CAD with CABG. No chest pain or shortness of breath 9.  Diastolic congestive heart failure. Monitor for signs of fluid overload. Patient on Lasix 20 mg daily prior to admission. Resume as needed Filed Weights   05/27/18 1139  Weight: 95.4 kg  10. Diabetes mellitus peripheral neuropathy. Hemoglobin A1c 8.2. SSI. Patient Amaryl 4 mg twice a day, Glucophage 1000 mg twice a day prior to admission. Resume as needed CBG (last 3)  Recent Labs    05/31/18 1713 05/31/18 2145 06/01/18 0621  GLUCAP 141* 137* 112*  controlled on current regimen 11. Hypertension. Permissive hypertension. Patient on Norvasc 10 mg daily, Coreg 3.125 mg every a.m. and 6.25 mg every p.m.,Imdur 60 mg twice a day, hydralazine 100 mg twice a dayprior to admission and resume as needed  Norvasc 5 started on 1/12- resume home dose to 10mg    Vitals:   05/31/18 0527 05/31/18 1421  BP: (!) 176/71 (!) 156/67  Pulse: (!) 45 (!) 53  Resp: 18 16  Temp: 97.6 F (36.4 C) (!) 97.5 F (36.4 C)  SpO2: 21% 11%  systolic still elevated but trending down, some lability persists- f/u PCP  Monitor with increased mobility 12. Chronic renal insufficiency. Baseline creatinine 1.41.  Creatinine 1.45 on 1/10, stable at 1.43 1/13   Continue to monitor 13. Hyperlipidemia. Crestor 14. Chronic anemia. Continue iron supplement  Hemoglobin 7.9 on 1/10  Stable at 7.4 , will order stool guaic, pt has had work up for this "Drs haven't found anything" Recheck hgb stable 7.8  Continue to monitor  LOS: 5 days A FACE TO [place] E  06/01/2018, 9:11 AM

## 2018-06-03 ENCOUNTER — Telehealth: Payer: Self-pay | Admitting: Registered Nurse

## 2018-06-03 ENCOUNTER — Other Ambulatory Visit: Payer: Self-pay | Admitting: Radiology

## 2018-06-03 ENCOUNTER — Other Ambulatory Visit: Payer: Self-pay

## 2018-06-03 ENCOUNTER — Encounter (HOSPITAL_COMMUNITY): Payer: Self-pay | Admitting: *Deleted

## 2018-06-03 NOTE — Anesthesia Preprocedure Evaluation (Addendum)
Anesthesia Evaluation  Patient identified by MRN, date of birth, ID band Patient awake    Reviewed: Allergy & Precautions, NPO status , Patient's Chart, lab work & pertinent test results  Airway Mallampati: II  TM Distance: >3 FB Neck ROM: Full    Dental no notable dental hx.    Pulmonary former smoker,    Pulmonary exam normal breath sounds clear to auscultation       Cardiovascular hypertension, Pt. on medications + CAD, + Past MI, + Cardiac Stents and + CABG  Normal cardiovascular exam Rhythm:Regular Rate:Normal     Neuro/Psych CVA, Residual Symptoms negative psych ROS   GI/Hepatic negative GI ROS, Neg liver ROS,   Endo/Other  diabetes  Renal/GU Renal InsufficiencyRenal disease  negative genitourinary   Musculoskeletal negative musculoskeletal ROS (+)   Abdominal   Peds negative pediatric ROS (+)  Hematology  (+) anemia , REFUSES BLOOD PRODUCTS, JEHOVAH'S WITNESS  Anesthesia Other Findings   Reproductive/Obstetrics negative OB ROS                            Anesthesia Physical Anesthesia Plan  ASA: III  Anesthesia Plan: General   Post-op Pain Management:    Induction: Intravenous  PONV Risk Score and Plan: 2 and Ondansetron and Treatment may vary due to age or medical condition  Airway Management Planned: Oral ETT  Additional Equipment: Arterial line  Intra-op Plan:   Post-operative Plan: Extubation in OR  Informed Consent: I have reviewed the patients History and Physical, chart, labs and discussed the procedure including the risks, benefits and alternatives for the proposed anesthesia with the patient or authorized representative who has indicated his/her understanding and acceptance.     Dental advisory given  Plan Discussed with: CRNA  Anesthesia Plan Comments: (Same day work-up. See PAT note written 06/03/2018 by Myra Gianotti, PA-C. )      Anesthesia  Quick Evaluation

## 2018-06-03 NOTE — Telephone Encounter (Signed)
Transitional Care call Transitional Care Call Completed  Patient name: [name] DOB: [birth year] 1. Are you/is patient experiencing any problems since coming home? No a. Are there any questions regarding any aspect of care? No 2. Are there any questions regarding medications administration/dosing? No a. Are meds being taken as prescribed? Yes b. "Patient should review meds with caller to confirm" Medication List Reviewed 3. Have there been any falls? No 4. Has Home Health been to the house and/or have they contacted you? Yes, [facility], scheduled to come out today, he reports. a. If not, have you tried to contact them? NA b. Can we help you contact them? NA 5. Are bowels and bladder emptying properly?Yes a. Are there any unexpected incontinence issues? No b. If applicable, is patient following bowel/bladder programs? NA 6. Any fevers, problems with breathing, unexpected pain? No 7. Are there any skin problems or new areas of breakdown? No 8. Has the patient/family member arranged specialty MD follow up (ie cardiology/neurology/renal/surgical/etc.)?  [name] reports he's in the process of arranging outpatient F/U appointments.  a. Can we help arrange? NA 9. Does the patient need any other services or support that we can help arrange? NA 10. Are caregivers following through as expected in assisting the patient? Yes 11. Has the patient quit smoking, drinking alcohol, or using drugs as recommended? [name] states he doesn't smoke, drink alcohol or use illicit drugs.  Appointment date/time 06/14/2018  arrival time 12:40 for 1:00 appointment with [name] ANP-C[facility] [place]

## 2018-06-03 NOTE — Progress Notes (Signed)
Spoke with pt for pre-op call. Pt has CAD hx with stents. Dr. Ellyn Hack is his cardiologist. Denies any recent chest pain or sob. Pt is a type 2 diabetic. Last A1C was 8.2 on 04/17/18. Pt states his fasting blood sugar is usually between 110-120. Instructed pt to check his blood sugar when he gets up Monday AM. Pt instructed not to take Metformin morning of surgery. If blood sugar is 70 or below, treat with 1/2 cup of clear juice (apple or cranberry) and recheck blood sugar 15 minutes after drinking juice. Pt voiced understanding.

## 2018-06-03 NOTE — Progress Notes (Signed)
Anesthesia Chart Review: [name]   Case:  [id number] Date/Time:  06/06/18 0715   Procedure:  STENTING (N/A )   Anesthesia type:  General   Pre-op diagnosis:  STENOSIS   Location:  [facility] OR RADIOLOGY ROOM / [facility]   Surgeon:  Luanne Bras, MD      DISCUSSION: Patient is an 85 year old male scheduled for cerebral angiogram with possible angioplasty/stenting left MCA 06/06/18. Admitted for acute CVA 05/24/18. Last coronary intervention 04/18/18. Had likely stress-inducted cardiomyopathy 04/17/19, but EF recovered by 05/25/18 echo. He remains on ASA and Plavix. He is a Sales promotion account executive Witness with last HGB 05/31/18 7.8 (range of 7.3-9.2 since 04/17/2018). More details outlined below and reviewed with anesthesiologist Suella Broad, MD. Anesthesiologist to evaluate on the day of procedure.  History includes former smoker (quit '61), CAD (CABG: LIMA-LAD, SVG-PDA 12/15/10; NSTEMI s/p DES SVG-PDA 06/17/14, 03/26/16 for in-stent restenosis; patent LIMA-LAD and SVG-PRDA with newly occluded mid RCA 08/27/17, medical therapy; NSTEMI s/p balloon angioplasty SVG-RPDA 04/18/18), chronic combined systolic and diastolic CHF, post-op afib 11/2010, HTN, HLD, AAA (4.7 cm 12/18/17), DM2, CKD stage III, suprapubic catheter, OSA (no CPAP), anemia (no blood products, Jehovah's Witness), CVA (2014, 2016, 05/24/18).  - Hospitalized 05/24/18-05/27/18 for acute left MCA infarct with high grade left MCA M1 stenosis. Neurology and IR consulted. Echo showed recovery of his EF. Plan for out-patient cerebral angiogram with likely angioplasty/stenting 06/06/18. Admitted to [name] 05/27/18-06/01/18 for ongoing PT/OT. He remains on ASA and Plavix. Anemia treated with ferrous sulfate as he does not accept blood products.  - Hospitalized 04/16/18-04/21/18 for NSTEMI. LHC revealed mostly stable angiography with 70% ostial in-stent restenosis of the SVG-RPDA treated with balloon angioplasty. EF was newly depressed, but with recent urologic issues (pain  around suprapubic catheter and required exchange of suprapubic catheter in ED) and with no finding of anterior ischemia on cath and not enough change in his CAD to warrant drop in EF hat Dr. Ellyn Hack suspected patient had a Takotsubo/stress-induced cardiomyopathy event. Medications adjusted and repeat echo planned in 3 months.    PROVIDERS: Suzan Garibaldi, FNP is listed as PCP - Glenetta Hew, MD is cardiologist. Last visit 04/28/18. No recurrent angina post 03/2016 PCI. He was starting a wean trial from Imdur given fatigue and headaches, but if symptoms recurred he would resume. Franchot Gallo, MD is urologist   LABS: Labs from 05/24/18-05/30/18 reviewed. Results include: Lab Results  Component Value Date   WBC 4.4 05/31/2018   HGB 7.8 (L) 05/31/2018   HCT 24.9 (L) 05/31/2018   PLT 318 05/31/2018   GLUCOSE 111 (H) 05/30/2018   ALT 9 05/30/2018   AST 12 (L) 05/30/2018   NA 138 05/30/2018   K 3.8 05/30/2018   CL 107 05/30/2018   CREATININE 1.43 (H) 05/30/2018   BUN 24 (H) 05/30/2018   CO2 24 05/30/2018   TSH 0.609 05/26/2018   INR 1.11 05/24/2018   HGBA1C 8.2 (H) 04/17/2018  Urine culture 05/24/18 showed no growth.  Lab Results  Component Value Date   CREATININE 1.43 (H) 05/30/2018   CREATININE 1.45 (H) 05/27/2018   CREATININE 1.47 (H) 05/26/2018   CBC Latest Ref Rng & Units 05/31/2018 05/30/2018 05/27/2018  WBC 4.0 - 10.5 K/uL 4.4 4.4 5.7  Hemoglobin 13.0 - 17.0 g/dL 7.8(L) 7.4(L) 7.9(L)  Hematocrit 39.0 - 52.0 % 24.9(L) 23.7(L) 24.9(L)  Platelets 150 - 400 K/uL 318 295 268    IMAGES: MRI brain 05/24/18: IMPRESSION: 1. Multifocal acute ischemia within the  left MCA territory, predominantly cortical and subcortical and near the watershed zones at the ACA/MCA and MCA/PCA junctions. These could be watershed or embolic infarcts. 2. Additional small focus of ischemia in the right frontal white matter, more suggestive of embolic etiology. 3. Chronic ischemic  microangiopathy. 4. No hemorrhage or mass effect.  CTA head/neck 05/24/18: IMPRESSION: 1. No emergent large vessel occlusion. 2. Severe stenosis of the distal left M1 segment, with preserved distal patency. 3. Attenuated appearance of the right vertebral artery with complete loss of patency at the proximal V3 segment. Right PICA supplied via collaterals from left PICA. 4. 60% stenosis of the proximal left internal carotid artery.  CXR 04/16/18: IMPRESSION: 1. Stable cardiomegaly with mild interstitial congestion without frank pulmonary edema. 2. Superimposed mild scattered bibasilar atelectasis. No other active cardiopulmonary disease. 3. Sequelae of prior CABG with aortic atherosclerosis.  CT abd/pelvis 12/18/17: IMPRESSION: 1. Aortic aneurysm NOS (ICD10-I71.9). Ascending aortic aneurysm, 4.7 centimeters. Recommend semi-annual imaging followup by CTA or MRA and referral to cardiothoracic surgery if not already obtained. This recommendation follows 2010 ACCF/AHA/AATS/ACR/ASA/SCA/SCAI/SIR/STS/SVM Guidelines for the Diagnosis and Management of Patients With Thoracic Aortic Disease. Circulation. 2010; 121: B939-Q300 2.  Aortic Atherosclerosis (ICD10-I70.0). 3. Small hiatal hernia. 4. Hepatic steatosis. 5. Distension of the gallbladder. Although no radiopaque calculi are identified, consider further evaluation with ultrasound if there is also clinical concern for gallbladder disease. 6. Nonspecific and stable bilateral perinephric stranding. No evidence for urinary tract obstruction. Nephrolithiasis versus renal artery calcifications. 7. Significant colonic diverticulosis. 8. Enlarged prostate. 9. Suprapubic catheter.   EKG: 05/25/18: Sinus bradycardia at 55 bpm.  First-degree AV block.  PACs.  Inferior infarct, age undetermined.  ST and T wave abnormality, consider anterolateral ischemia.  No significant change since last tracing.   CV: Carotid US 05/25/18: Summary: Right  Carotid: Velocities in the right ICA are consistent with a 1-39% stenosis. Left Carotid: Velocities in the left ICA are consistent with a 1-39% stenosis. Vertebrals: Bilateral vertebral arteries demonstrate antegrade flow.   Echo 05/25/18: Study Conclusions - Left ventricle: The cavity size was normal. Wall thickness was   increased in a pattern of moderate LVH. The estimated ejection   fraction was 55%. Wall motion was normal; there were no regional   wall motion abnormalities. Doppler parameters are consistent with   abnormal left ventricular relaxation (grade 1 diastolic   dysfunction). - Aortic valve: There was no stenosis. - Aorta: Mildly dilated aortic root. Aortic root dimension: 42 mm   (ED). - Mitral valve: There was trivial regurgitation. - Left atrium: The atrium was mildly dilated. - Right ventricle: The cavity size was normal. Systolic function   was mildly reduced. - Tricuspid valve: Peak RV-RA gradient (S): 26 mm Hg. - Pulmonary arteries: PA peak pressure: 29 mm Hg (S). - Inferior vena cava: The vessel was normal in size. The   respirophasic diameter changes were in the normal range (= 50%),   consistent with normal central venous pressure. Impressions: - Normal LV size with moderate LV hypertrophy. EF 55%. Normal RV   size with mildly decreased systolic function. No significant   valvular abnormalities. Mildly dilated aortic root. (Comparison EF 35-40% 04/17/18, 60-65% 08/28/17)   Cardiac cath 04/18/18:  SVG-RCA graft was visualized by angiography and is large.  Origin to Prox Graft stent is mostly 10% stenosed. Origin lesion is 75% stenosed.  Balloon angioplasty was performed on Ostial SVG-RCA Stent -- 3.25 mm Herminie balloon. Post intervention, there is a 0% residual stenosis.  --------------------------------------------------------  Prox RCA lesion is 95% stenosed. Mid RCA to Dist RCA lesion is 100% stenosed. RPDA lesion is 70% stenosed.  Prox LAD lesion is 55%  stenosed. Ost 1st Diag lesion is 50% stenosed.  Mid LAD lesion is 70% stenosed just prior to LIMA insertion  LIMA-LAD graft was visualized by angiography and is normal in caliber. The graft exhibits no disease. -Moderate disease at the insertion site. There is competitive flow.  --------------------------------------------------------  Mid Cx lesion is 45% stenosed. 2nd Mrg lesion is 80% stenosed.  LV end diastolic pressure is severely elevated.  Origin lesion is 75% stenosed.  Post intervention, there is a 0% residual stenosis.  Balloon angioplasty was performed. SUMMARY:  Mostly stable angiography with 70% ostial stent in-stent restenosis of SVG-RPDA treated with balloon angioplasty.  There is severe native vessel disease with occluded RCA and mid-distal LAD.  Patent LIMA-LAD and the remainder of the SVG-PDA.  Severely elevated LVEDP with known reduced EF by echo -consistent with ACUTE ON CHRONIC COMBINED SYSTOLIC AND DIASTOLIC HEART FAILURE RECOMMENDATIONS:  Return to [facility] for standard post PCI care.  TR band removal per protocol.  Continue dual antiplatelet therapy -okay to stop aspirin after 6 months.  Continue Plavix long-term.  Discontinue IV nitro  We will give 2 doses of IV Lasix tonight and tomorrow morning. --Need to reassess discharge dose of diuretic   Past Medical History:  Diagnosis Date  . Abdominal aortic aneurysm ([place])    a. Aortic duplex 06/2014: mild aneurysmal dilatation of proximal abdominal aorta measuring 3.4x3.4cm. No sig change from 2012. F/u due 06/2016;   . AKI (acute kidney injury) ([place]) 03/28/2016  . Arthritis    "right knee; never bothered me" (03/26/2016)  . 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 CAD in native artery    a. NSTEMI 11/2010 - CABG x2(LIMA to LAD, SVG to PDA). b. NEG Lexi MV 10/24/13, EF 53%, no perfusion abnormality, septal and apical HK noted. c. NSTEMI 05/2014 - s/p DES to  SVG-RPDA 06/18/14 (Xience Alpine DES 3.0 x 18 mm -3.35 mm), EF 60-65; d. 03/2016 STEMI/PCI: LM nl, ost LAD 70%, mLCx 50%, pRCA 95% - mRCA 60%, dRPDA 70%, LIMA->LAD ok, o-p SVG->RPDA 100% (3.0x16 Promus DES overlapping prior stent).  . Diastolic dysfunction    a. 03/2016 Echo: EF 60-65%, no rwma, Gr1 DD, triv AI, Ao root 35mm, Asc Ao 57mm, triv MR.  . Dizziness    a. Carotid duplex 03/2014: mild fibrous plaque, no significant stenosis.  . Foley catheter in place    "been wearing it for a couple months now" (03/26/2016)  . HTN (hypertension)   . Hyperlipidemia LDL goal <70   . Medical history non-contributory   . Non-STEMI (non-ST elevated myocardial infarction) ([place]) 11/2010; 03/2018   1. Ostial LAD 70% (to close to [facility] for PCI), subtotal occlusion of the RCA; 2.  In-stent restenosis SVG-RCA -PTCA.  Marland Kitchen Postoperative atrial fibrillation ([place]) 11/2010   Post CABG, no sign recurrence  . Refusal of blood transfusions as patient is Jehovah's Witness   . 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 Servando Snare)  . Sleep apnea    Not on CPAP. (03/26/2016)  . ST elevation myocardial infarction (STEMI) of inferior wall ([place]) 04/05/2016   Occluded in-stent restenosis/thrombosis of SVG-RCA --> treated with overlapping Promus DES 3.0 mm 16 mm postdilated 3.0 mm).  . Stroke [facility]) 2014; 01/2015   a. 2014 with  mild right hand weakness, nonhemorrhagic per pt.;; b. - PTA-Stent L ICA 95%   . Type II diabetes mellitus ([place])     Past Surgical History:  Procedure Laterality Date  . APPENDECTOMY    . CARDIAC CATHETERIZATION  12/11/2010   Dr. Chase Picket - subsequent cath - normal LV systolic function, no renal artery stenosis, severe 2-vessel disease with subtotaled RCA prox and distal 60% lesion and complex 70% area of narrowing of ostium of LAD  . CARDIAC CATHETERIZATION N/A 03/26/2016   Procedure: Left Heart Cath and Coronary Angiography;  Surgeon: Nelva Bush, MD;  Location: [place] CV LAB;   Service: Cardiovascular;  Laterality: N/A;  . CARDIAC CATHETERIZATION N/A 03/26/2016   Procedure: Coronary Stent Intervention;  Surgeon: Nelva Bush, MD;  Location: [place] CV LAB;  Service: Cardiovascular: 100% In-stent thrombosis of pros SVG-RCA (Xience DES) --> treated with PromusDES 3.0 x 18 (3.3 mm)  . CARDIAC CATHETERIZATION N/A 03/26/2016   Procedure: Bypass Graft Angiography;  Surgeon: Nelva Bush, MD;  Location: [place] CV LAB;  Service: Cardiovascular;  Laterality: N/A;  . CORONARY ANGIOPLASTY WITH STENT PLACEMENT  06/18/2014   PCI to SVG-RPDA 06/18/14 (Xience Alpine DES 3.0 x 18 mm -3.35 mm),   . CORONARY ARTERY BYPASS GRAFT  12/15/2010   X2, Dr Servando Snare; LIMA to LAD, SVG to PDA;   . CORONARY BALLOON ANGIOPLASTY N/A 04/18/2018   Procedure: CORONARY BALLOON ANGIOPLASTY;  Surgeon: Leonie Man, MD;  Location: [place] CV LAB;  Service: Cardiovascular;;; high pressure scoring and noncompliant balloon PTCA of SVG-RCA ISR ostial and proximal  . CORONARY/GRAFT ANGIOGRAPHY N/A 08/27/2017   Procedure: CORONARY/GRAFT ANGIOGRAPHY;  Surgeon: Nelva Bush, MD;  Location: [place] [facility];  Service: Cardiovascular;; pLAD 70%, ostD1 50%.  mCx 60%, OM2 80%. pRCA 95% & mRCA 100% - rPDA 70%. LIMA-mLAD patent. SVG-rPDA 10% ISR.   Marland Kitchen CYSTOSCOPY WITH URETHRAL DILATATION    . IR ANGIO INTRA EXTRACRAN SEL COM CAROTID INNOMINATE BILAT MOD SED  02/25/2017  . IR ANGIO INTRA EXTRACRAN SEL COM CAROTID INNOMINATE BILAT MOD SED  10/19/2017   Dr. Kathee Delton: L Common Carotid - ECA & major branches widely patent. ICA ~20% distal to bulb & 50% in supraclinoid segment. LMCA-distal 1/3 MI ~905 stenosis with post-stenotic dilation into inferior division. ~50% prox Basilar A stenosis @ anterior Inf Cerebellar A. 50% R ICA  . IR ANGIO VERTEBRAL SEL SUBCLAVIAN INNOMINATE UNI L MOD SED  02/25/2017  . IR ANGIO VERTEBRAL SEL SUBCLAVIAN INNOMINATE UNI R MOD SED  10/19/2017  . IR ANGIO VERTEBRAL SEL VERTEBRAL UNI  L MOD SED  10/19/2017  . IR GENERIC HISTORICAL  01/21/2016   IR RADIOLOGIST EVAL & MGMT 01/21/2016 [facility]-INTERV RAD  . IR GENERIC HISTORICAL  02/03/2016   IR CATHETER TUBE CHANGE 02/03/2016 Marybelle Killings, MD [facility]-INTERV RAD  . IR RADIOLOGIST EVAL & MGMT  11/09/2017  . LEFT HEART CATH AND CORONARY ANGIOGRAPHY N/A 04/18/2018   Procedure: LEFT HEART CATH AND CORONARY ANGIOGRAPHY;  Surgeon: Leonie Man, MD;  Location: [place] [facility];  Service: Cardiovascular;  Laterality: N/A; stable findings on last cath with exception of 75% in-stent restenosis of SVG-RCA ostial stent treated with PTCA.    Marland Kitchen LEFT HEART CATHETERIZATION WITH CORONARY ANGIOGRAM N/A 06/18/2014   Procedure: LEFT HEART CATHETERIZATION WITH CORONARY ANGIOGRAM;  Surgeon: Leonie Man, MD;  Location: [facility] CATH LAB;  Service: Cardiovascular;  -- severe disease of SVG-rPDA  . NO PAST SURGERIES    . RADIOLOGY WITH  ANESTHESIA N/A 01/24/2015   Procedure: STENT ASSISTED ANGIOPLASTY (RADIOLOGY WITH ANESTHESIA);  Surgeon: Luanne Bras, MD;  Location: [place];  Service: Radiology;  Laterality: N/A;  . TONSILLECTOMY    . TRANSTHORACIC ECHOCARDIOGRAM  08/2017; 04/2018:    A) EF 60-65%. Mild LVH. No RWMA. Gr 1 DD. Mod LA dilation.;  B)  EF 35-40%.  Severe LVH.  GRII DD.  Apical anteroseptal hypokinesis.  Akinesis of the apex.    MEDICATIONS: No current facility-administered medications for this encounter.    Marland Kitchen acetaminophen (TYLENOL) 325 MG tablet  . amLODipine (NORVASC) 10 MG tablet  . aspirin 81 MG chewable tablet  . carvedilol (COREG) 6.25 MG tablet  . clopidogrel (PLAVIX) 75 MG tablet  . doxazosin (CARDURA) 1 MG tablet  . Ferrous Sulfate Dried (FERROUS SULFATE IRON) 200 (65 Fe) MG TABS  . furosemide (LASIX) 20 MG tablet  . isosorbide mononitrate (IMDUR) 60 MG 24 hr tablet  . metFORMIN (GLUCOPHAGE) 500 MG tablet  . nitroGLYCERIN (NITROSTAT) 0.4 MG SL tablet  . oxybutynin (DITROPAN) 5 MG tablet  . rosuvastatin (CRESTOR) 20 MG tablet  . Vitamin  D, Ergocalciferol, (DRISDOL) 1.25 MG (50000 UT) CAPS capsule    Myra Gianotti, PA-C Surgical Short Stay/Anesthesiology [facility] Phone [phone] [facility] Phone [phone] 06/03/2018 4:46 PM

## 2018-06-04 ENCOUNTER — Other Ambulatory Visit: Payer: Self-pay | Admitting: Physician Assistant

## 2018-06-06 ENCOUNTER — Ambulatory Visit (HOSPITAL_COMMUNITY)
Admission: RE | Admit: 2018-06-06 | Discharge: 2018-06-06 | Disposition: A | Discharge status: Home / Self Care | Payer: Medicare Other | Source: Ambulatory Visit | Attending: Interventional Radiology | Admitting: Interventional Radiology

## 2018-06-06 ENCOUNTER — Ambulatory Visit (HOSPITAL_COMMUNITY)
Admission: RE | Admit: 2018-06-06 | Discharge: 2018-06-06 | Disposition: A | Discharge status: Home / Self Care | Payer: Medicare Other | Attending: Interventional Radiology | Admitting: Interventional Radiology

## 2018-06-06 ENCOUNTER — Other Ambulatory Visit: Payer: Self-pay

## 2018-06-06 ENCOUNTER — Encounter (HOSPITAL_COMMUNITY)
Admission: RE | Disposition: A | Discharge status: Home / Self Care | Payer: Self-pay | Source: Home / Self Care | Attending: Interventional Radiology

## 2018-06-06 ENCOUNTER — Encounter (HOSPITAL_COMMUNITY): Payer: Self-pay

## 2018-06-06 ENCOUNTER — Telehealth: Payer: Self-pay

## 2018-06-06 ENCOUNTER — Ambulatory Visit (HOSPITAL_COMMUNITY): Payer: Medicare Other | Admitting: Vascular Surgery

## 2018-06-06 ENCOUNTER — Ambulatory Visit (HOSPITAL_COMMUNITY)
Admit: 2018-06-06 | Discharge: 2018-06-06 | Disposition: A | Discharge status: Home / Self Care | Payer: Medicare Other | Attending: Interventional Radiology | Admitting: Interventional Radiology

## 2018-06-06 DIAGNOSIS — Z955 Presence of coronary angioplasty implant and graft: Secondary | ICD-10-CM | POA: Insufficient documentation

## 2018-06-06 DIAGNOSIS — I771 Stricture of artery: Secondary | ICD-10-CM

## 2018-06-06 DIAGNOSIS — Z888 Allergy status to other drugs, medicaments and biological substances status: Secondary | ICD-10-CM

## 2018-06-06 DIAGNOSIS — Z87891 Personal history of nicotine dependence: Secondary | ICD-10-CM

## 2018-06-06 DIAGNOSIS — E785 Hyperlipidemia, unspecified: Secondary | ICD-10-CM | POA: Insufficient documentation

## 2018-06-06 DIAGNOSIS — Z7902 Long term (current) use of antithrombotics/antiplatelets: Secondary | ICD-10-CM | POA: Insufficient documentation

## 2018-06-06 DIAGNOSIS — I6522 Occlusion and stenosis of left carotid artery: Secondary | ICD-10-CM | POA: Insufficient documentation

## 2018-06-06 DIAGNOSIS — Z951 Presence of aortocoronary bypass graft: Secondary | ICD-10-CM | POA: Insufficient documentation

## 2018-06-06 DIAGNOSIS — I509 Heart failure, unspecified: Secondary | ICD-10-CM | POA: Insufficient documentation

## 2018-06-06 DIAGNOSIS — Z79899 Other long term (current) drug therapy: Secondary | ICD-10-CM | POA: Insufficient documentation

## 2018-06-06 DIAGNOSIS — G4730 Sleep apnea, unspecified: Secondary | ICD-10-CM

## 2018-06-06 DIAGNOSIS — Z8249 Family history of ischemic heart disease and other diseases of the circulatory system: Secondary | ICD-10-CM | POA: Insufficient documentation

## 2018-06-06 DIAGNOSIS — Z7984 Long term (current) use of oral hypoglycemic drugs: Secondary | ICD-10-CM | POA: Insufficient documentation

## 2018-06-06 DIAGNOSIS — I714 Abdominal aortic aneurysm, without rupture: Secondary | ICD-10-CM | POA: Diagnosis not present

## 2018-06-06 DIAGNOSIS — K760 Fatty (change of) liver, not elsewhere classified: Secondary | ICD-10-CM | POA: Insufficient documentation

## 2018-06-06 DIAGNOSIS — Z8673 Personal history of transient ischemic attack (TIA), and cerebral infarction without residual deficits: Secondary | ICD-10-CM | POA: Diagnosis not present

## 2018-06-06 DIAGNOSIS — I6602 Occlusion and stenosis of left middle cerebral artery: Secondary | ICD-10-CM | POA: Insufficient documentation

## 2018-06-06 DIAGNOSIS — M1990 Unspecified osteoarthritis, unspecified site: Secondary | ICD-10-CM | POA: Diagnosis not present

## 2018-06-06 DIAGNOSIS — I5030 Unspecified diastolic (congestive) heart failure: Secondary | ICD-10-CM | POA: Insufficient documentation

## 2018-06-06 DIAGNOSIS — E119 Type 2 diabetes mellitus without complications: Secondary | ICD-10-CM

## 2018-06-06 DIAGNOSIS — I252 Old myocardial infarction: Secondary | ICD-10-CM | POA: Insufficient documentation

## 2018-06-06 DIAGNOSIS — I4891 Unspecified atrial fibrillation: Secondary | ICD-10-CM | POA: Diagnosis not present

## 2018-06-06 DIAGNOSIS — D649 Anemia, unspecified: Secondary | ICD-10-CM

## 2018-06-06 DIAGNOSIS — Z7982 Long term (current) use of aspirin: Secondary | ICD-10-CM | POA: Insufficient documentation

## 2018-06-06 DIAGNOSIS — I2510 Atherosclerotic heart disease of native coronary artery without angina pectoris: Secondary | ICD-10-CM | POA: Diagnosis not present

## 2018-06-06 DIAGNOSIS — I110 Hypertensive heart disease with heart failure: Secondary | ICD-10-CM

## 2018-06-06 DIAGNOSIS — I712 Thoracic aortic aneurysm, without rupture: Secondary | ICD-10-CM | POA: Insufficient documentation

## 2018-06-06 HISTORY — DX: Anemia, unspecified: D64.9

## 2018-06-06 HISTORY — PX: IR ANGIO INTRA EXTRACRAN SEL COM CAROTID INNOMINATE UNI L MOD SED: IMG5358

## 2018-06-06 HISTORY — PX: RADIOLOGY WITH ANESTHESIA: SHX6223

## 2018-06-06 HISTORY — DX: Pneumonia, unspecified organism: J18.9

## 2018-06-06 LAB — GLUCOSE, CAPILLARY
Glucose-Capillary: 135 mg/dL — ABNORMAL HIGH (ref 70–99)
Glucose-Capillary: 161 mg/dL — ABNORMAL HIGH (ref 70–99)

## 2018-06-06 LAB — CBC WITH DIFFERENTIAL/PLATELET
Abs Immature Granulocytes: 0.12 10*3/uL — ABNORMAL HIGH (ref 0.00–0.07)
BASOS PCT: 1 %
Basophils Absolute: 0 10*3/uL (ref 0.0–0.1)
Eosinophils Absolute: 0.2 10*3/uL (ref 0.0–0.5)
Eosinophils Relative: 3 %
HCT: 26.9 % — ABNORMAL LOW (ref 39.0–52.0)
Hemoglobin: 8.1 g/dL — ABNORMAL LOW (ref 13.0–17.0)
Immature Granulocytes: 2 %
Lymphocytes Relative: 35 %
Lymphs Abs: 1.7 10*3/uL (ref 0.7–4.0)
MCH: 27.6 pg (ref 26.0–34.0)
MCHC: 30.1 g/dL (ref 30.0–36.0)
MCV: 91.8 fL (ref 80.0–100.0)
Monocytes Absolute: 0.7 10*3/uL (ref 0.1–1.0)
Monocytes Relative: 13 %
Neutro Abs: 2.2 10*3/uL (ref 1.7–7.7)
Neutrophils Relative %: 46 %
PLATELETS: 341 10*3/uL (ref 150–400)
RBC: 2.93 MIL/uL — ABNORMAL LOW (ref 4.22–5.81)
RDW: 18.4 % — AB (ref 11.5–15.5)
WBC: 5 10*3/uL (ref 4.0–10.5)
nRBC: 0 % (ref 0.0–0.2)

## 2018-06-06 LAB — BASIC METABOLIC PANEL
Anion gap: 11 (ref 5–15)
BUN: 26 mg/dL — ABNORMAL HIGH (ref 8–23)
CO2: 23 mmol/L (ref 22–32)
Calcium: 8.8 mg/dL — ABNORMAL LOW (ref 8.9–10.3)
Chloride: 106 mmol/L (ref 98–111)
Creatinine, Ser: 1.59 mg/dL — ABNORMAL HIGH (ref 0.61–1.24)
GFR calc Af Amer: 45 mL/min — ABNORMAL LOW (ref >60)
GFR, EST NON AFRICAN AMERICAN: 39 mL/min — AB (ref >60)
Glucose, Bld: 141 mg/dL — ABNORMAL HIGH (ref 70–99)
Potassium: 4.1 mmol/L (ref 3.5–5.1)
Sodium: 140 mmol/L (ref 135–145)

## 2018-06-06 LAB — PLATELET INHIBITION P2Y12: Platelet Function  P2Y12: 160 [PRU] — ABNORMAL LOW (ref 194–418)

## 2018-06-06 LAB — PROTIME-INR
INR: 1.1
Prothrombin Time: 14.1 seconds (ref 11.4–15.2)

## 2018-06-06 SURGERY — IR WITH ANESTHESIA
Anesthesia: General

## 2018-06-06 MED ORDER — MIDAZOLAM HCL 2 MG/2ML IJ SOLN
INTRAMUSCULAR | Status: AC
  Filled 2018-06-06: qty 2

## 2018-06-06 MED ORDER — HEPARIN SODIUM (PORCINE) 1000 UNIT/ML IJ SOLN
INTRAMUSCULAR | Status: DC | PRN
  Administered 2018-06-06: 1000 [IU] via INTRAVENOUS

## 2018-06-06 MED ORDER — NIMODIPINE 30 MG PO CAPS: 0.0000 mg | ORAL_CAPSULE | ORAL | Status: DC

## 2018-06-06 MED ORDER — MIDAZOLAM HCL 2 MG/2ML IJ SOLN
INTRAMUSCULAR | Status: DC | PRN
  Administered 2018-06-06: .5 mg via INTRAVENOUS

## 2018-06-06 MED ORDER — LIDOCAINE HCL 1 % IJ SOLN
INTRAMUSCULAR | Status: AC
  Filled 2018-06-06: qty 20

## 2018-06-06 MED ORDER — FENTANYL CITRATE (PF) 100 MCG/2ML IJ SOLN
INTRAMUSCULAR | Status: AC
  Filled 2018-06-06: qty 2

## 2018-06-06 MED ORDER — ASPIRIN EC 325 MG PO TBEC: 325.0000 mg | DELAYED_RELEASE_TABLET | ORAL | Status: DC

## 2018-06-06 MED ORDER — METOCLOPRAMIDE HCL 5 MG/ML IJ SOLN: 10.0000 mg | Freq: Once | INTRAMUSCULAR | Status: DC | PRN

## 2018-06-06 MED ORDER — IOHEXOL 300 MG/ML  SOLN
150.0000 mL | Freq: Once | INTRAMUSCULAR | Status: AC | PRN
  Administered 2018-06-06: 30 mL via INTRA_ARTERIAL

## 2018-06-06 MED ORDER — CLOPIDOGREL BISULFATE 75 MG PO TABS
ORAL_TABLET | ORAL | Status: AC
  Filled 2018-06-06: qty 1

## 2018-06-06 MED ORDER — CEFAZOLIN SODIUM-DEXTROSE 2-4 GM/100ML-% IV SOLN
2.0000 g | INTRAVENOUS | Status: DC
  Filled 2018-06-06: qty 100

## 2018-06-06 MED ORDER — SODIUM CHLORIDE 0.9 % IV SOLN: INTRAVENOUS | Status: DC

## 2018-06-06 MED ORDER — SODIUM CHLORIDE 0.9 % IV SOLN: INTRAVENOUS | Status: AC

## 2018-06-06 MED ORDER — CLOPIDOGREL BISULFATE 75 MG PO TABS: 75.0000 mg | ORAL_TABLET | ORAL | Status: DC

## 2018-06-06 MED ORDER — NIMODIPINE 30 MG PO CAPS
0.0000 mg | ORAL_CAPSULE | ORAL | Status: DC
  Filled 2018-06-06: qty 2

## 2018-06-06 MED ORDER — ASPIRIN EC 325 MG PO TBEC
325.0000 mg | DELAYED_RELEASE_TABLET | ORAL | Status: AC
  Administered 2018-06-06: 325 mg via ORAL
  Filled 2018-06-06: qty 1

## 2018-06-06 MED ORDER — LACTATED RINGERS IV SOLN
INTRAVENOUS | Status: DC | PRN
  Administered 2018-06-06: 07:00:00 via INTRAVENOUS

## 2018-06-06 MED ORDER — CLOPIDOGREL BISULFATE 75 MG PO TABS
75.0000 mg | ORAL_TABLET | Freq: Once | ORAL | Status: AC
  Administered 2018-06-06: 75 mg via ORAL
  Filled 2018-06-06: qty 1

## 2018-06-06 MED ORDER — FENTANYL CITRATE (PF) 100 MCG/2ML IJ SOLN: 25.0000 ug | INTRAMUSCULAR | Status: DC | PRN

## 2018-06-06 MED ORDER — FENTANYL CITRATE (PF) 100 MCG/2ML IJ SOLN
INTRAMUSCULAR | Status: DC | PRN
  Administered 2018-06-06: 25 ug via INTRAVENOUS

## 2018-06-06 MED ORDER — LIDOCAINE HCL (PF) 1 % IJ SOLN
INTRAMUSCULAR | Status: DC | PRN
  Administered 2018-06-06: 20 mL

## 2018-06-06 MED ORDER — CEFAZOLIN SODIUM-DEXTROSE 2-4 GM/100ML-% IV SOLN: 2.0000 g | INTRAVENOUS | Status: DC

## 2018-06-06 MED ORDER — IOPAMIDOL (ISOVUE-300) INJECTION 61%
INTRAVENOUS | Status: AC
  Filled 2018-06-06: qty 50

## 2018-06-06 MED ORDER — MEPERIDINE HCL 50 MG/ML IJ SOLN: 6.2500 mg | INTRAMUSCULAR | Status: DC | PRN

## 2018-06-06 NOTE — Discharge Instructions (Signed)

## 2018-06-06 NOTE — H&P (Signed)
Chief Complaint: Patient was seen in consultation today for cerebral arteriogram with left middle cerebral artery angioplasty/stent placement at the request of Dr Royal Hawthorn  Supervising Physician: Luanne Bras  Patient Status: [facility] - Out-pt  History of Present Illness: [name] is a 85 y.o. male   Hx CAD/CANG CHF; DM; HTN; HLD; CRI Former smoker Left [place] cerebral artery stenosis Admitted with right sided weakness; CVA 05/24/18- 06/01/18  MRA 1/7: IMPRESSION: 1. Multifocal acute ischemia within the left MCA territory, predominantly cortical and subcortical and near the watershed zones at the ACA/MCA and MCA/PCA junctions. These could be watershed or embolic infarcts. 2. Additional small focus of ischemia in the right frontal white matter, more suggestive of embolic etiology. 3. Chronic ischemic microangiopathy. 4. No hemorrhage or mass effect.  CTA 1/7:  IMPRESSION: 1. No emergent large vessel occlusion. 2. Severe stenosis of the distal left M1 segment, with preserved distal patency. 3. Attenuated appearance of the right vertebral artery with complete loss of patency at the proximal V3 segment. Right PICA supplied via collaterals from left PICA. 4. 60% stenosis of the proximal left internal carotid artery.  CIR and discharged 1/15 to return for planned cerebral arteriogram with possible L MCA angioplasty/stent placement  Denies headache; denies N/V Denies dizziness; tinnitus Denies numbness tingling Does fell some light headedness if stands too quickly  Scheduled now for cerebral arteriogram with L MCA angioplasty/stent  Past Medical History:  Diagnosis Date  . Abdominal aortic aneurysm ([place])    a. Aortic duplex 06/2014: mild aneurysmal dilatation of proximal abdominal aorta measuring 3.4x3.4cm. No sig change from 2012. F/u due 06/2016;   . AKI (acute kidney injury) ([place]) 03/28/2016  . Anemia    low iron  . Arthritis    "right knee; never bothered me"  (03/26/2016)  . 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 CAD in native artery    a. NSTEMI 11/2010 - CABG x2(LIMA to LAD, SVG to PDA). b. NEG Lexi MV 10/24/13, EF 53%, no perfusion abnormality, septal and apical HK noted. c. NSTEMI 05/2014 - s/p DES to SVG-RPDA 06/18/14 (Xience Alpine DES 3.0 x 18 mm -3.35 mm), EF 60-65; d. 03/2016 STEMI/PCI: LM nl, ost LAD 70%, mLCx 50%, pRCA 95% - mRCA 60%, dRPDA 70%, LIMA->LAD ok, o-p SVG->RPDA 100% (3.0x16 Promus DES overlapping prior stent).  . Diastolic dysfunction    a. 03/2016 Echo: EF 60-65%, no rwma, Gr1 DD, triv AI, Ao root 77mm, Asc Ao 56mm, triv MR.  . Dizziness    a. Carotid duplex 03/2014: mild fibrous plaque, no significant stenosis.  . Foley catheter in place    "been wearing it for a couple months now" (03/26/2016)  . HTN (hypertension)   . Hyperlipidemia LDL goal <70   . Medical history non-contributory   . Non-STEMI (non-ST elevated myocardial infarction) ([place]) 11/2010; 03/2018   1. Ostial LAD 70% (to close to [facility] for PCI), subtotal occlusion of the RCA; 2.  In-stent restenosis SVG-RCA -PTCA.  Marland Kitchen Pneumonia    as a baby  . Postoperative atrial fibrillation ([place]) 11/2010   Post CABG, no sign recurrence  . Refusal of blood transfusions as patient is Jehovah's Witness   . 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 Servando Snare)  . Sleep apnea    Not on CPAP. (03/26/2016)  . ST elevation myocardial infarction (STEMI) of inferior wall ([place]) 04/05/2016   Occluded in-stent restenosis/thrombosis of SVG-RCA -->  treated with overlapping Promus DES 3.0 mm 16 mm postdilated 3.0 mm).  . Stroke [facility]) 2014; 01/2015   a. 2014 with mild right hand weakness, nonhemorrhagic per pt.;; b. - PTA-Stent L ICA 95%   . Type II diabetes mellitus ([place])     Past Surgical History:  Procedure Laterality Date  . APPENDECTOMY    . CARDIAC CATHETERIZATION  12/11/2010   Dr. Chase Picket - subsequent cath - normal LV systolic function,  no renal artery stenosis, severe 2-vessel disease with subtotaled RCA prox and distal 60% lesion and complex 70% area of narrowing of ostium of LAD  . CARDIAC CATHETERIZATION N/A 03/26/2016   Procedure: Left Heart Cath and Coronary Angiography;  Surgeon: Nelva Bush, MD;  Location: [place] CV LAB;  Service: Cardiovascular;  Laterality: N/A;  . CARDIAC CATHETERIZATION N/A 03/26/2016   Procedure: Coronary Stent Intervention;  Surgeon: Nelva Bush, MD;  Location: [place] CV LAB;  Service: Cardiovascular: 100% In-stent thrombosis of pros SVG-RCA (Xience DES) --> treated with PromusDES 3.0 x 18 (3.3 mm)  . CARDIAC CATHETERIZATION N/A 03/26/2016   Procedure: Bypass Graft Angiography;  Surgeon: Nelva Bush, MD;  Location: [place] CV LAB;  Service: Cardiovascular;  Laterality: N/A;  . CORONARY ANGIOPLASTY WITH STENT PLACEMENT  06/18/2014   PCI to SVG-RPDA 06/18/14 (Xience Alpine DES 3.0 x 18 mm -3.35 mm),   . CORONARY ARTERY BYPASS GRAFT  12/15/2010   X2, Dr Servando Snare; LIMA to LAD, SVG to PDA;   . CORONARY BALLOON ANGIOPLASTY N/A 04/18/2018   Procedure: CORONARY BALLOON ANGIOPLASTY;  Surgeon: Leonie Man, MD;  Location: [place] CV LAB;  Service: Cardiovascular;;; high pressure scoring and noncompliant balloon PTCA of SVG-RCA ISR ostial and proximal  . CORONARY/GRAFT ANGIOGRAPHY N/A 08/27/2017   Procedure: CORONARY/GRAFT ANGIOGRAPHY;  Surgeon: Nelva Bush, MD;  Location: [place] CV LAB;  Service: Cardiovascular;; pLAD 70%, ostD1 50%.  mCx 60%, OM2 80%. pRCA 95% & mRCA 100% - rPDA 70%. LIMA-mLAD patent. SVG-rPDA 10% ISR.   Marland Kitchen CYSTOSCOPY WITH URETHRAL DILATATION    . IR ANGIO INTRA EXTRACRAN SEL COM CAROTID INNOMINATE BILAT MOD SED  02/25/2017  . IR ANGIO INTRA EXTRACRAN SEL COM CAROTID INNOMINATE BILAT MOD SED  10/19/2017   Dr. Kathee Delton: L Common Carotid - ECA & major branches widely patent. ICA ~20% distal to bulb & 50% in supraclinoid segment. LMCA-distal 1/3 MI ~905 stenosis  with post-stenotic dilation into inferior division. ~50% prox Basilar A stenosis @ anterior Inf Cerebellar A. 50% R ICA  . IR ANGIO VERTEBRAL SEL SUBCLAVIAN INNOMINATE UNI L MOD SED  02/25/2017  . IR ANGIO VERTEBRAL SEL SUBCLAVIAN INNOMINATE UNI R MOD SED  10/19/2017  . IR ANGIO VERTEBRAL SEL VERTEBRAL UNI L MOD SED  10/19/2017  . IR GENERIC HISTORICAL  01/21/2016   IR RADIOLOGIST EVAL & MGMT 01/21/2016 [facility]-INTERV RAD  . IR GENERIC HISTORICAL  02/03/2016   IR CATHETER TUBE CHANGE 02/03/2016 Marybelle Killings, MD [facility]-INTERV RAD  . IR RADIOLOGIST EVAL & MGMT  11/09/2017  . LEFT HEART CATH AND CORONARY ANGIOGRAPHY N/A 04/18/2018   Procedure: LEFT HEART CATH AND CORONARY ANGIOGRAPHY;  Surgeon: Leonie Man, MD;  Location: [place] [facility];  Service: Cardiovascular;  Laterality: N/A; stable findings on last cath with exception of 75% in-stent restenosis of SVG-RCA ostial stent treated with PTCA.    Marland Kitchen LEFT HEART CATHETERIZATION WITH CORONARY ANGIOGRAM N/A 06/18/2014   Procedure: LEFT HEART CATHETERIZATION WITH CORONARY ANGIOGRAM;  Surgeon: Leonie Man, MD;  Location:  [place] CATH LAB;  Service: Cardiovascular;  -- severe disease of SVG-rPDA  . NO PAST SURGERIES    . RADIOLOGY WITH ANESTHESIA N/A 01/24/2015   Procedure: STENT ASSISTED ANGIOPLASTY (RADIOLOGY WITH ANESTHESIA);  Surgeon: Luanne Bras, MD;  Location: [place];  Service: Radiology;  Laterality: N/A;  . TONSILLECTOMY    . TRANSTHORACIC ECHOCARDIOGRAM  08/2017; 04/2018:    A) EF 60-65%. Mild LVH. No RWMA. Gr 1 DD. Mod LA dilation.;  B)  EF 35-40%.  Severe LVH.  GRII DD.  Apical anteroseptal hypokinesis.  Akinesis of the apex.    Allergies: Other; Statins; Atorvastatin; Crestor [rosuvastatin]; Pravastatin; and Simvastatin  Medications: Prior to Admission medications   Medication Sig Start Date End Date Taking? Authorizing Provider  acetaminophen (TYLENOL) 325 MG tablet Take 2 tablets (650 mg total) by mouth every 6 (six) hours as needed for mild pain (or  Fever >/= 101). 05/31/18  Yes Angiulli, Lavon Paganini, PA-C  amLODipine (NORVASC) 10 MG tablet Take 1 tablet (10 mg total) by mouth every evening. 05/31/18  Yes Angiulli, Lavon Paganini, PA-C  aspirin 81 MG chewable tablet Chew 1 tablet (81 mg total) by mouth daily. 03/29/16  Yes Isaiah Serge, NP  carvedilol (COREG) 6.25 MG tablet Take 0.5 tablets (3.125 mg total) by mouth every morning AND 1 tablet (6.25 mg total) every evening. Patient taking differently: Take 3.125 mg by mouth in the morning and 6.25 mg in the evening 04/26/18  Yes Leonie Man, MD  clopidogrel (PLAVIX) 75 MG tablet Take 1 tablet (75 mg total) by mouth daily. 05/31/18  Yes Angiulli, Lavon Paganini, PA-C  doxazosin (CARDURA) 1 MG tablet Take 1 tablet (1 mg total) by mouth daily. 05/31/18  Yes Angiulli, Lavon Paganini, PA-C  Ferrous Sulfate Dried (FERROUS SULFATE IRON) 200 (65 Fe) MG TABS Take 1 tablet by mouth 2 (two) times daily after a meal.   Yes [provider]  furosemide (LASIX) 20 MG tablet Take 1 tablet (20 mg total) by mouth daily. 05/31/18  Yes Angiulli, Lavon Paganini, PA-C  isosorbide mononitrate (IMDUR) 60 MG 24 hr tablet Take 1 tablet (60 mg total) by mouth 2 (two) times daily. 05/31/18  Yes Angiulli, Lavon Paganini, PA-C  metFORMIN (GLUCOPHAGE) 500 MG tablet Take 1 tablet (500 mg total) by mouth 2 (two) times daily with a meal. 05/31/18  Yes Angiulli, Lavon Paganini, PA-C  oxybutynin (DITROPAN) 5 MG tablet Take 5 mg by mouth daily as needed for bladder spasms.  09/07/17  Yes [provider]  rosuvastatin (CRESTOR) 20 MG tablet Take 1 tablet (20 mg total) by mouth daily. 05/31/18  Yes Angiulli, Lavon Paganini, PA-C  Vitamin D, Ergocalciferol, (DRISDOL) 1.25 MG (50000 UT) CAPS capsule Take 1 capsule (50,000 Units total) by mouth every 7 (seven) days for 14 days. 05/27/18 06/10/18 Yes Santos-Sanchez, Merlene Morse, MD  nitroGLYCERIN (NITROSTAT) 0.4 MG SL tablet Place 1 tablet (0.4 mg total) under the tongue every 5 (five) minutes as needed for chest pain (up to  3 doses). Patient taking differently: Place 0.4 mg under the tongue every 5 (five) minutes x 3 doses as needed for chest pain.  08/25/17   Almyra Deforest, PA     Family History  Problem Relation Age of Onset  . Heart Problems Father 80    Social History   Socioeconomic History  . Marital status: Married    Spouse name: [name]  . Number of children: 1  . Years of education: Not on file  . Highest education level:  Not on file  Occupational History    Employer: RETIRED  Social Needs  . Financial resource strain: Not very hard  . Food insecurity:    Worry: Never true    Inability: Never true  . Transportation needs:    Medical: No    Non-medical: No  Tobacco Use  . Smoking status: Former Smoker    Packs/day: 0.33    Years: 10.00    Pack years: 3.30    Types: Cigarettes    Last attempt to quit: 1961    Years since quitting: 59.0  . Smokeless tobacco: Never Used  Substance and Sexual Activity  . Alcohol use: No  . Drug use: No  . Sexual activity: Never  Lifestyle  . Physical activity:    Days per week: 6 days    Minutes per session: 30 min  . Stress: Not at all  Relationships  . Social connections:    Talks on phone: More than three times a week    Gets together: More than three times a week    Attends religious service: 1 to 4 times per year    Active member of club or organization: Yes    Attends meetings of clubs or organizations: 1 to 4 times per year    Relationship status: Married  Other Topics Concern  . Not on file  Social History Narrative   Married father of one.  Previously uses stationary bike routinely.  Now reduced due to other social stressors.   Quit smoking 50 years ago.  Does not drink alcohol    Review of Systems: A 12 point ROS discussed and pertinent positives are indicated in the HPI above.  All other systems are negative.  Review of Systems  Constitutional: Negative for activity change, fatigue and fever.  HENT: Negative for tinnitus and  trouble swallowing.   Eyes: Negative for visual disturbance.  Respiratory: Negative for cough and shortness of breath.   Cardiovascular: Negative for chest pain.  Gastrointestinal: Negative for abdominal pain, nausea and vomiting.  Musculoskeletal: Negative for back pain, gait problem and joint swelling.  Neurological: Negative for dizziness, tremors, seizures, syncope, facial asymmetry, speech difficulty, weakness, light-headedness, numbness and headaches.  Psychiatric/Behavioral: Negative for agitation, behavioral problems, confusion and decreased concentration.    Vital Signs: Ht 6\' 1"  (1.854 m)   Wt 210 lb 5.1 oz (95.4 kg)   BMI 27.75 kg/m   Physical Exam Vitals signs reviewed.  HENT:     Head: Atraumatic.  Eyes:     Extraocular Movements: Extraocular movements intact.  Neck:     Musculoskeletal: Normal range of motion.  Cardiovascular:     Rate and Rhythm: Normal rate and regular rhythm.     Heart sounds: Normal heart sounds.  Pulmonary:     Effort: Pulmonary effort is normal.     Breath sounds: Normal breath sounds.  Abdominal:     General: Bowel sounds are normal.     Palpations: Abdomen is soft.     Tenderness: There is no abdominal tenderness.  Musculoskeletal: Normal range of motion.        General: No tenderness.  Skin:    General: Skin is warm and dry.  Neurological:     General: No focal deficit present.     Mental Status: He is alert and oriented to person, place, and time.  Psychiatric:        Mood and Affect: Mood normal.        Behavior: Behavior normal.  Thought Content: Thought content normal.        Judgment: Judgment normal.     Imaging: Ct Angio Head W Or Wo Contrast  Result Date: 05/24/2018 CLINICAL DATA:  Slurred speech and right facial droop EXAM: CT ANGIOGRAPHY HEAD AND NECK TECHNIQUE: Multidetector CT imaging of the head and neck was performed using the standard protocol during bolus administration of intravenous contrast.  Multiplanar CT image reconstructions and MIPs were obtained to evaluate the vascular anatomy. Carotid stenosis measurements (when applicable) are obtained utilizing NASCET criteria, using the distal internal carotid diameter as the denominator. CONTRAST:  12mL ISOVUE-370 IOPAMIDOL (ISOVUE-370) INJECTION 76% COMPARISON:  Head CT 05/24/2018 FINDINGS: CTA NECK FINDINGS SKELETON: Lower cervical degenerative disc disease. No bony spinal canal stenosis. OTHER NECK: Normal pharynx, larynx and major salivary glands. No cervical lymphadenopathy. Unremarkable thyroid gland. UPPER CHEST: No pneumothorax or pleural effusion. No nodules or masses. AORTIC ARCH: There is mild calcific atherosclerosis of the aortic arch. There is no aneurysm, dissection or hemodynamically significant stenosis of the visualized ascending aorta and aortic arch. Conventional 3 vessel aortic branching pattern. The visualized proximal subclavian arteries are widely patent. RIGHT CAROTID SYSTEM: --Common carotid artery: Widely patent origin without common carotid artery dissection or aneurysm. --Internal carotid artery: No dissection, occlusion or aneurysm. Mild atherosclerotic calcification at the carotid bifurcation without hemodynamically significant stenosis. --External carotid artery: No acute abnormality. LEFT CAROTID SYSTEM: --Common carotid artery: Widely patent origin without common carotid artery dissection or aneurysm. --Internal carotid artery: No dissection, occlusion or aneurysm. There is mixed density atherosclerosis extending into the proximal ICA, resulting in 60% stenosis. --External carotid artery: No acute abnormality. VERTEBRAL ARTERIES: Left dominant configuration. Both origins are normal. The right vertebral artery is diminutive along its entire course with attenuated contrast-enhancement. There is complete loss of opacification beyond the proximal V3 segment. The dominant left vertebral artery is widely patent along its entire  course. There is atherosclerotic calcification of the V4 segment without hemodynamically significant stenosis. CTA HEAD FINDINGS POSTERIOR CIRCULATION: --Basilar artery: Normal. --Posterior cerebral arteries: Normal. The right PCA is predominantly supplied by the posterior communicating artery. --Superior cerebellar arteries: Normal. --Inferior cerebellar arteries: Normal AICA bilaterally. The right PICA is supplied from the left PICA. ANTERIOR CIRCULATION: --Intracranial internal carotid arteries: Moderate atherosclerotic calcification. --Anterior cerebral arteries: Normal. Both A1 segments are present. Patent anterior communicating artery. --Middle cerebral arteries: There is severe stenosis of the distal left M1 segment, which is otherwise normal caliber distally. The right middle cerebral artery is normal. --Posterior communicating arteries: Right p-comm gives rise to the right PCA. No left p-comm is visualized. VENOUS SINUSES: As permitted by contrast timing, patent. ANATOMIC VARIANTS: Fetal origin of the right posterior cerebral artery. DELAYED PHASE: Not performed Review of the MIP images confirms the above findings. IMPRESSION: 1. No emergent large vessel occlusion. 2. Severe stenosis of the distal left M1 segment, with preserved distal patency. 3. Attenuated appearance of the right vertebral artery with complete loss of patency at the proximal V3 segment. Right PICA supplied via collaterals from left PICA. 4. 60% stenosis of the proximal left internal carotid artery. These results were communicated to Dr. Karena Addison Aroor at 2:52 pm on 05/24/2018 by text page via the [facility] messaging system. Electronically Signed   By: Ulyses Jarred M.D.   On: 05/24/2018 14:51   Ct Angio Neck W Or Wo Contrast  Result Date: 05/24/2018 CLINICAL DATA:  Slurred speech and right facial droop EXAM: CT ANGIOGRAPHY HEAD AND NECK TECHNIQUE: Multidetector CT imaging of  the head and neck was performed using the standard protocol during  bolus administration of intravenous contrast. Multiplanar CT image reconstructions and MIPs were obtained to evaluate the vascular anatomy. Carotid stenosis measurements (when applicable) are obtained utilizing NASCET criteria, using the distal internal carotid diameter as the denominator. CONTRAST:  148mL ISOVUE-370 IOPAMIDOL (ISOVUE-370) INJECTION 76% COMPARISON:  Head CT 05/24/2018 FINDINGS: CTA NECK FINDINGS SKELETON: Lower cervical degenerative disc disease. No bony spinal canal stenosis. OTHER NECK: Normal pharynx, larynx and major salivary glands. No cervical lymphadenopathy. Unremarkable thyroid gland. UPPER CHEST: No pneumothorax or pleural effusion. No nodules or masses. AORTIC ARCH: There is mild calcific atherosclerosis of the aortic arch. There is no aneurysm, dissection or hemodynamically significant stenosis of the visualized ascending aorta and aortic arch. Conventional 3 vessel aortic branching pattern. The visualized proximal subclavian arteries are widely patent. RIGHT CAROTID SYSTEM: --Common carotid artery: Widely patent origin without common carotid artery dissection or aneurysm. --Internal carotid artery: No dissection, occlusion or aneurysm. Mild atherosclerotic calcification at the carotid bifurcation without hemodynamically significant stenosis. --External carotid artery: No acute abnormality. LEFT CAROTID SYSTEM: --Common carotid artery: Widely patent origin without common carotid artery dissection or aneurysm. --Internal carotid artery: No dissection, occlusion or aneurysm. There is mixed density atherosclerosis extending into the proximal ICA, resulting in 60% stenosis. --External carotid artery: No acute abnormality. VERTEBRAL ARTERIES: Left dominant configuration. Both origins are normal. The right vertebral artery is diminutive along its entire course with attenuated contrast-enhancement. There is complete loss of opacification beyond the proximal V3 segment. The dominant left  vertebral artery is widely patent along its entire course. There is atherosclerotic calcification of the V4 segment without hemodynamically significant stenosis. CTA HEAD FINDINGS POSTERIOR CIRCULATION: --Basilar artery: Normal. --Posterior cerebral arteries: Normal. The right PCA is predominantly supplied by the posterior communicating artery. --Superior cerebellar arteries: Normal. --Inferior cerebellar arteries: Normal AICA bilaterally. The right PICA is supplied from the left PICA. ANTERIOR CIRCULATION: --Intracranial internal carotid arteries: Moderate atherosclerotic calcification. --Anterior cerebral arteries: Normal. Both A1 segments are present. Patent anterior communicating artery. --Middle cerebral arteries: There is severe stenosis of the distal left M1 segment, which is otherwise normal caliber distally. The right middle cerebral artery is normal. --Posterior communicating arteries: Right p-comm gives rise to the right PCA. No left p-comm is visualized. VENOUS SINUSES: As permitted by contrast timing, patent. ANATOMIC VARIANTS: Fetal origin of the right posterior cerebral artery. DELAYED PHASE: Not performed Review of the MIP images confirms the above findings. IMPRESSION: 1. No emergent large vessel occlusion. 2. Severe stenosis of the distal left M1 segment, with preserved distal patency. 3. Attenuated appearance of the right vertebral artery with complete loss of patency at the proximal V3 segment. Right PICA supplied via collaterals from left PICA. 4. 60% stenosis of the proximal left internal carotid artery. These results were communicated to Dr. Karena Addison Aroor at 2:52 pm on 05/24/2018 by text page via the [facility] messaging system. Electronically Signed   By: Ulyses Jarred M.D.   On: 05/24/2018 14:51   Mr Brain Wo Contrast  Result Date: 05/24/2018 CLINICAL DATA:  Right facial droop EXAM: MRI HEAD WITHOUT CONTRAST TECHNIQUE: Multiplanar, multiecho pulse sequences of the brain and surrounding  structures were obtained without intravenous contrast. COMPARISON:  CTA head neck 05/24/2018 FINDINGS: BRAIN: There is multifocal cortical and subcortical diffusion restriction within the left MCA territory. Additionally, there is a focus of abnormal diffusion restriction in the right frontal white matter. The midline structures are normal. No midline shift or other  mass effect. There are old bilateral lacunar infarcts. There is an old left frontoparietal infarct. Multifocal periventricular white matter hyperintensity, most often a result of chronic microvascular ischemia. Generalized atrophy without lobar predilection. Susceptibility-sensitive sequences show no chronic microhemorrhage or superficial siderosis. VASCULAR: Major intracranial arterial and venous sinus flow voids are normal. SKULL AND UPPER CERVICAL SPINE: Calvarial bone marrow signal is normal. There is no skull base mass. Visualized upper cervical spine and soft tissues are normal. SINUSES/ORBITS: No fluid levels or advanced mucosal thickening. No mastoid or middle ear effusion. The orbits are normal. IMPRESSION: 1. Multifocal acute ischemia within the left MCA territory, predominantly cortical and subcortical and near the watershed zones at the ACA/MCA and MCA/PCA junctions. These could be watershed or embolic infarcts. 2. Additional small focus of ischemia in the right frontal white matter, more suggestive of embolic etiology. 3. Chronic ischemic microangiopathy. 4. No hemorrhage or mass effect. Electronically Signed   By: Ulyses Jarred M.D.   On: 05/24/2018 21:05   Ct Head Code Stroke Wo Contrast  Result Date: 05/24/2018 CLINICAL DATA:  Code stroke. 85 y/o M; slurred speech and right facial droop. EXAM: CT HEAD WITHOUT CONTRAST TECHNIQUE: Contiguous axial images were obtained from the base of the skull through the vertex without intravenous contrast. COMPARISON:  12/13/2017 CT head. FINDINGS: Brain: No evidence of acute infarction, hemorrhage,  hydrocephalus, extra-axial collection or mass lesion/mass effect. Stable small chronic left frontoparietal infarct and left lentiform nucleus lacunar infarct. Stable nonspecific white matter hypodensities compatible with chronic microvascular ischemic changes. Stable volume loss of the brain. Vascular: Calcific atherosclerosis of carotid siphons. No hyperdense vessel identified. Skull: Normal. Negative for fracture or focal lesion. Sinuses/Orbits: Mild maxillary and ethmoid sinus mucosal thickening. Normal aeration of the mastoid air cells. Other: None. ASPECTS [place] [facility] Stroke Program Early CT Score) - Ganglionic level infarction (caudate, lentiform nuclei, internal capsule, insula, M1-M3 cortex): 7 - Supraganglionic infarction (M4-M6 cortex): 3 Total score (0-10 with 10 being normal): 10 IMPRESSION: 1. No acute intracranial abnormality identified. 2. Stable small chronic left frontoparietal infarct. Stable chronic microvascular ischemic changes and volume loss of the brain. 3. ASPECTS is 10 These results were communicated to Dr. Lorraine Lax at 2:22 pmon 1/7/2020by text page via the [facility] messaging system. Electronically Signed   By: Kristine Garbe M.D.   On: 05/24/2018 14:25   Vas US Carotid  Result Date: 05/25/2018 Carotid Arterial Duplex Study Indications:       Stroke, Carotid stenosis on left. Other Factors:     Coronary Balloon Angioplasty on 04/18/2018. Limitations:       Vessels tortuosity Comparison Study:  Carotid duplex exam on 03/29/2014 Performing Technologist: Rudell Cobb  Examination Guidelines: A complete evaluation includes B-mode imaging, spectral Doppler, color Doppler, and power Doppler as needed of all accessible portions of each vessel. Bilateral testing is considered an integral part of a complete examination. Limited examinations for reoccurring indications may be performed as noted.  Right Carotid Findings: +----------+--------+--------+--------+------------------------+--------+            PSV cm/sEDV cm/sStenosisDescribe                Comments +----------+--------+--------+--------+------------------------+--------+ CCA Prox  119     22              focal and calcific      tortuous +----------+--------+--------+--------+------------------------+--------+ CCA Distal69      22                                               +----------+--------+--------+--------+------------------------+--------+  ICA Prox  77      32      1-39%   diffuse and heterogenous         +----------+--------+--------+--------+------------------------+--------+ ICA Mid   58      21                                               +----------+--------+--------+--------+------------------------+--------+ ICA Distal78      30                                               +----------+--------+--------+--------+------------------------+--------+ ECA       96      12                                               +----------+--------+--------+--------+------------------------+--------+ +----------+--------+-------+--------+-------------------+           PSV cm/sEDV cmsDescribeArm Pressure (mmHG) +----------+--------+-------+--------+-------------------+ Subclavian119                                        +----------+--------+-------+--------+-------------------+ +---------+--------+--+--------+-+---------+ VertebralPSV cm/s26EDV cm/s5Antegrade +---------+--------+--+--------+-+---------+  Left Carotid Findings: +----------+--------+--------+--------+------------------------+--------+           PSV cm/sEDV cm/sStenosisDescribe                Comments +----------+--------+--------+--------+------------------------+--------+ CCA Prox  55      15              diffuse and heterogenoustortuous +----------+--------+--------+--------+------------------------+--------+ CCA Distal52      10                                                +----------+--------+--------+--------+------------------------+--------+ ICA Prox  78      26      1-39%   diffuse and calcific             +----------+--------+--------+--------+------------------------+--------+ ICA Mid   89      39                                               +----------+--------+--------+--------+------------------------+--------+ ICA Distal80      31                                               +----------+--------+--------+--------+------------------------+--------+ ECA       79      14                                               +----------+--------+--------+--------+------------------------+--------+ +----------+--------+--------+--------+-------------------+ SubclavianPSV cm/sEDV cm/sDescribeArm Pressure (mmHG) +----------+--------+--------+--------+-------------------+  109                                         +----------+--------+--------+--------+-------------------+ +---------+--------+--+--------+--+---------+ VertebralPSV cm/s65EDV cm/s22Antegrade +---------+--------+--+--------+--+---------+  Summary: Right Carotid: Velocities in the right ICA are consistent with a 1-39% stenosis. Left Carotid: Velocities in the left ICA are consistent with a 1-39% stenosis. Vertebrals: Bilateral vertebral arteries demonstrate antegrade flow. *See table(s) above for measurements and observations.  Electronically signed by Antony Contras MD on 05/25/2018 at 12:47:48 PM.    Final     Labs:  CBC: Recent Labs    05/27/18 0647 05/30/18 0703 05/31/18 0900 06/06/18 0627  WBC 5.7 4.4 4.4 5.0  HGB 7.9* 7.4* 7.8* 8.1*  HCT 24.9* 23.7* 24.9* 26.9*  PLT 268 295 318 341    COAGS: Recent Labs    08/25/17 1009 10/19/17 0818 05/24/18 1406 06/06/18 0627  INR 1.0 1.02 1.11 1.10  APTT 29  --  32  --     BMP: Recent Labs    05/26/18 0538 05/27/18 0647 05/30/18 0703 06/06/18 0627  NA 138 138 138 140  K 3.6 3.8 3.8 4.1  CL  107 107 107 106  CO2 23 25 24 23   GLUCOSE 109* 117* 111* 141*  BUN 19 19 24* 26*  CALCIUM 8.4* 8.5* 8.3* 8.8*  CREATININE 1.47* 1.45* 1.43* 1.59*  GFRNONAA 43* 44* 44* 39*  GFRAA 50* 51* 51* 45*    LIVER FUNCTION TESTS: Recent Labs    04/19/18 0259 04/27/18 0932 05/24/18 1406 05/30/18 0703  BILITOT 0.4 0.2 0.5 0.6  AST 20 8 15  12*  ALT 10 8 9 9   ALKPHOS 56 76 55 51  PROT 5.3* 6.3 6.6 5.7*  ALBUMIN 3.0* 4.1 3.4* 3.1*    TUMOR MARKERS: No results for input(s): AFPTM, CEA, CA199, CHROMGRNA in the last 8760 hours.  Assessment and Plan:  CVA 05/24/18 CIR discharged 06/01/18 Imaging revealing L MCA stenosis  Scheduled for cerebral arteriogram with possible angioplasty/stent placement Risks and benefits of cerebral angiogram with intervention were discussed with the patient including, but not limited to bleeding, infection, vascular injury, contrast induced renal failure, stroke or even death.  This interventional procedure involves the use of X-rays and because of the nature of the planned procedure, it is possible that we will have prolonged use of X-ray fluoroscopy.  Potential radiation risks to you include (but are not limited to) the following: - A slightly elevated risk for cancer  several years later in life. This risk is typically less than 0.5% percent. This risk is low in comparison to the normal incidence of human cancer, which is 33% for women and 50% for men according to the [place]. - Radiation induced injury can include skin redness, resembling a rash, tissue breakdown / ulcers and hair loss (which can be temporary or permanent).   The likelihood of either of these occurring depends on the difficulty of the procedure and whether you are sensitive to radiation due to previous procedures, disease, or genetic conditions.   IF your procedure requires a prolonged use of radiation, you will be notified and given written instructions for further action.   It is your responsibility to monitor the irradiated area for the 2 weeks following the procedure and to notify your physician if you are concerned that you have suffered a radiation induced injury.    All of the patient's questions  were answered, patient is agreeable to proceed.  Consent signed and in chart. Pt is aware if intervention is performed - he will be admitted overnight into Neuro ICU overnight He is agreeable to proceed  Thank you for this interesting consult.  I greatly enjoyed meeting [name] and look forward to participating in their care.  A copy of this report was sent to the requesting provider on this date.  Electronically Signed: Lavonia Drafts, PA-C 06/06/2018, 7:09 AM   I spent a total of    25 Minutes in face to face in clinical consultation, greater than 50% of which was counseling/coordinating care for cerebral arteriogram and L MCA angioplasty/stent

## 2018-06-06 NOTE — Sedation Documentation (Signed)
Right groin sheath removed. 5Fr exoseal closure device used. Manual pressure being held at groin site.

## 2018-06-06 NOTE — Anesthesia Postprocedure Evaluation (Signed)
Anesthesia Post Note  Patient: [name]  Procedure(s) Performed: STENTING (N/A )     Patient location during evaluation: PACU Anesthesia Type: MAC Level of consciousness: awake and alert Pain management: pain level controlled Vital Signs Assessment: post-procedure vital signs reviewed and stable Respiratory status: spontaneous breathing, nonlabored ventilation, respiratory function stable and patient connected to nasal cannula oxygen Cardiovascular status: stable and blood pressure returned to baseline Postop Assessment: no apparent nausea or vomiting Anesthetic complications: no    Last Vitals:  Vitals:   06/06/18 0951 06/06/18 1006  BP: (!) 163/71 (!) 170/74  Pulse: (!) 50 (!) 52  Resp: 18 18  Temp: 36.4 C   SpO2: 100% 97%    Last Pain:  Vitals:   06/06/18 0951  PainSc: 0-No pain                 Montez Hageman

## 2018-06-06 NOTE — Transfer of Care (Signed)
Immediate Anesthesia Transfer of Care Note  Patient: [name]  Procedure(s) Performed: STENTING (N/A )  Patient Location: PACU  Anesthesia Type:General  Level of Consciousness: awake, alert , oriented and patient cooperative  Airway & Oxygen Therapy: Patient Spontanous Breathing  Post-op Assessment: Report given to RN and Post -op Vital signs reviewed and stable  Post vital signs: Reviewed and stable  Last Vitals:  Vitals Value Taken Time  BP 177/78 06/06/2018  9:21 AM  Temp    Pulse 55 06/06/2018  9:22 AM  Resp 12 06/06/2018  9:22 AM  SpO2 99 % 06/06/2018  9:22 AM  Vitals shown include unvalidated device data.  Last Pain:  Vitals:   06/06/18 0559  PainSc: 0-No pain         Complications: No apparent anesthesia complications

## 2018-06-06 NOTE — Telephone Encounter (Signed)
Verdis Frederickson, PT from [facility] called requesting verbal orders for HHPT 1wk1, 2wk4. Orders approved and given per discharge summary.

## 2018-06-06 NOTE — Anesthesia Procedure Notes (Signed)
Procedure Name: MAC Date/Time: 06/06/2018 8:10 AM Performed by: Elayne Snare, CRNA Pre-anesthesia Checklist: Patient identified, Emergency Drugs available, Suction available and Patient being monitored Patient Re-evaluated:Patient Re-evaluated prior to induction Oxygen Delivery Method: Nasal cannula

## 2018-06-06 NOTE — Progress Notes (Signed)
Verbal orders at bedside from Dr. Estanislado Pandy not to give patient Nimotop or second dose of Plavix 75mg .

## 2018-06-06 NOTE — Procedures (Signed)
S/P Lt common carotid arteriogtram. RT CFA approach. Findings . 1.High grade LT LMCA distal M1 stenosis.

## 2018-06-07 ENCOUNTER — Encounter (HOSPITAL_COMMUNITY): Payer: Self-pay | Admitting: Interventional Radiology

## 2018-06-10 ENCOUNTER — Encounter (HOSPITAL_COMMUNITY): Payer: Self-pay | Admitting: Interventional Radiology

## 2018-06-14 ENCOUNTER — Encounter: Payer: Self-pay | Admitting: Registered Nurse

## 2018-06-14 ENCOUNTER — Telehealth: Payer: Self-pay | Admitting: *Deleted

## 2018-06-14 ENCOUNTER — Encounter: Payer: Medicare Other | Attending: Registered Nurse | Admitting: Registered Nurse

## 2018-06-14 VITALS — BP 163/86 | HR 57 | Resp 14 | Ht 73.0 in | Wt 216.0 lb

## 2018-06-14 DIAGNOSIS — I63512 Cerebral infarction due to unspecified occlusion or stenosis of left middle cerebral artery: Secondary | ICD-10-CM | POA: Insufficient documentation

## 2018-06-14 DIAGNOSIS — I5032 Chronic diastolic (congestive) heart failure: Secondary | ICD-10-CM | POA: Insufficient documentation

## 2018-06-14 DIAGNOSIS — N183 Chronic kidney disease, stage 3 unspecified: Secondary | ICD-10-CM

## 2018-06-14 DIAGNOSIS — D638 Anemia in other chronic diseases classified elsewhere: Secondary | ICD-10-CM | POA: Insufficient documentation

## 2018-06-14 DIAGNOSIS — I10 Essential (primary) hypertension: Secondary | ICD-10-CM | POA: Diagnosis present

## 2018-06-14 DIAGNOSIS — I25810 Atherosclerosis of coronary artery bypass graft(s) without angina pectoris: Secondary | ICD-10-CM | POA: Insufficient documentation

## 2018-06-14 DIAGNOSIS — E785 Hyperlipidemia, unspecified: Secondary | ICD-10-CM | POA: Insufficient documentation

## 2018-06-14 DIAGNOSIS — Z8673 Personal history of transient ischemic attack (TIA), and cerebral infarction without residual deficits: Secondary | ICD-10-CM | POA: Diagnosis present

## 2018-06-14 NOTE — Progress Notes (Signed)
Subjective:    Patient ID: [name], male    DOB: [birth year], 85 y.o.   MRN: [id number]  HPI: [name] is a 85 y.o. male who is here for transitional care visit. He presented to [facility] ED for right arm weakness and difficulty speaking. CT Head WO Contrast was ordered.  CT WO Contrast:  IMPRESSION: 1. No acute intracranial abnormality identified. 2. Stable small chronic left frontoparietal infarct. Stable chronic microvascular ischemic changes and volume loss of the brain. 3. ASPECTS is 10  CT Angio Neck W or WO Contrast:  IMPRESSION: 1. No emergent large vessel occlusion. 2. Severe stenosis of the distal left M1 segment, with preserved distal patency. 3. Attenuated appearance of the right vertebral artery with complete loss of patency at the proximal V3 segment. Right PICA supplied via collaterals from left PICA. 4. 60% stenosis of the proximal left internal carotid artery.  MRI Brain WO Contrast IMPRESSION: 1. Multifocal acute ischemia within the left MCA territory, predominantly cortical and subcortical and near the watershed zones at the ACA/MCA and MCA/PCA junctions. These could be watershed or embolic infarcts. 2. Additional small focus of ischemia in the right frontal white matter, more suggestive of embolic etiology. 3. Chronic ischemic microangiopathy. 4. No hemorrhage or mass effect.  Intervention Radiology was consulted and outpatient  cerebral angiogram was scheduled for 06/06/2018. [name] reports they   He was admitted to [facility] on 05/27/2018 and Discharged 06/01/2018. He was discharged home and receiving out patient therapy with [facility]. Also reports good appetite and denies pain. He rated his pain 0.   Pain Inventory Average Pain 0 Pain Right Now 0 My pain is no pain  In the last 24 hours, has pain interfered with the following? General activity 0 Relation with others 0 Enjoyment of life 0 What TIME of day  is your pain at its worst? no pain Sleep (in general) Good  Pain is worse with: no pain Pain improves with: no pain Relief from Meds: no pain  Mobility Do you have any goals in this area?  no  Function Do you have any goals in this area?  no  Neuro/Psych No problems in this area  Prior Studies transitional care  Physicians involved in your care transitional care   Family History  Problem Relation Age of Onset  . Heart Problems Father 56   Social History   Socioeconomic History  . Marital status: Married    Spouse name: [name]  . Number of children: 1  . Years of education: Not on file  . Highest education level: Not on file  Occupational History    Employer: RETIRED  Social Needs  . Financial resource strain: Not very hard  . Food insecurity:    Worry: Never true    Inability: Never true  . Transportation needs:    Medical: No    Non-medical: No  Tobacco Use  . Smoking status: Former Smoker    Packs/day: 0.33    Years: 10.00    Pack years: 3.30    Types: Cigarettes    Last attempt to quit: 1961    Years since quitting: 59.1  . Smokeless tobacco: Never Used  Substance and Sexual Activity  . Alcohol use: No  . Drug use: No  . Sexual activity: Never  Lifestyle  . Physical activity:    Days per week: 6 days    Minutes per session: 30 min  . Stress: Not at all  Relationships  . Social connections:    Talks on phone: More than three times a week    Gets together: More than three times a week    Attends religious service: 1 to 4 times per year    Active member of club or organization: Yes    Attends meetings of clubs or organizations: 1 to 4 times per year    Relationship status: Married  Other Topics Concern  . Not on file  Social History Narrative   Married father of one.  Previously uses stationary bike routinely.  Now reduced due to other social stressors.   Quit smoking 50 years ago.  Does not drink alcohol   Past Surgical History:  Procedure  Laterality Date  . APPENDECTOMY    . CARDIAC CATHETERIZATION  12/11/2010   Dr. Chase Picket - subsequent cath - normal LV systolic function, no renal artery stenosis, severe 2-vessel disease with subtotaled RCA prox and distal 60% lesion and complex 70% area of narrowing of ostium of LAD  . CARDIAC CATHETERIZATION N/A 03/26/2016   Procedure: Left Heart Cath and Coronary Angiography;  Surgeon: Nelva Bush, MD;  Location: [place] CV [facility];  Service: Cardiovascular;  Laterality: N/A;  . CARDIAC CATHETERIZATION N/A 03/26/2016   Procedure: Coronary Stent Intervention;  Surgeon: Nelva Bush, MD;  Location: [place] [facility];  Service: Cardiovascular: 100% In-stent thrombosis of pros SVG-RCA (Xience DES) --> treated with PromusDES 3.0 x 18 (3.3 mm)  . CARDIAC CATHETERIZATION N/A 03/26/2016   Procedure: Bypass Graft Angiography;  Surgeon: Nelva Bush, MD;  Location: [place] [facility];  Service: Cardiovascular;  Laterality: N/A;  . CORONARY ANGIOPLASTY WITH STENT PLACEMENT  06/18/2014   PCI to SVG-RPDA 06/18/14 (Xience Alpine DES 3.0 x 18 mm -3.35 mm),   . CORONARY ARTERY BYPASS GRAFT  12/15/2010   X2, Dr Servando Snare; LIMA to LAD, SVG to PDA;   . CORONARY BALLOON ANGIOPLASTY N/A 04/18/2018   Procedure: CORONARY BALLOON ANGIOPLASTY;  Surgeon: Leonie Man, MD;  Location: [place] [facility];  Service: Cardiovascular;;; high pressure scoring and noncompliant balloon PTCA of SVG-RCA ISR ostial and proximal  . CORONARY/GRAFT ANGIOGRAPHY N/A 08/27/2017   Procedure: CORONARY/GRAFT ANGIOGRAPHY;  Surgeon: Nelva Bush, MD;  Location: [place] [facility];  Service: Cardiovascular;; pLAD 70%, ostD1 50%.  mCx 60%, OM2 80%. pRCA 95% & mRCA 100% - rPDA 70%. LIMA-mLAD patent. SVG-rPDA 10% ISR.   Marland Kitchen CYSTOSCOPY WITH URETHRAL DILATATION    . IR ANGIO INTRA EXTRACRAN SEL COM CAROTID INNOMINATE BILAT MOD SED  02/25/2017  . IR ANGIO INTRA EXTRACRAN SEL COM CAROTID INNOMINATE BILAT MOD SED  10/19/2017   Dr. Kathee Delton:  L Common Carotid - ECA & major branches widely patent. ICA ~20% distal to bulb & 50% in supraclinoid segment. LMCA-distal 1/3 MI ~905 stenosis with post-stenotic dilation into inferior division. ~50% prox Basilar A stenosis @ anterior Inf Cerebellar A. 50% R ICA  . IR ANGIO INTRA EXTRACRAN SEL COM CAROTID INNOMINATE UNI L MOD SED  06/06/2018  . IR ANGIO VERTEBRAL SEL SUBCLAVIAN INNOMINATE UNI L MOD SED  02/25/2017  . IR ANGIO VERTEBRAL SEL SUBCLAVIAN INNOMINATE UNI R MOD SED  10/19/2017  . IR ANGIO VERTEBRAL SEL VERTEBRAL UNI L MOD SED  10/19/2017  . IR GENERIC HISTORICAL  01/21/2016   IR RADIOLOGIST EVAL & MGMT 01/21/2016 [facility]INTER[facility]  . IR GENERIC HISTORICAL  02/03/2016   IR CATHETER TUBE CHANGE 02/03/2016 Marybelle Killings, MD [facility]INTERV RAD  . IR RADIOLOGIST EVAL & MGMT  11/09/2017  . LEFT HEART CATH AND CORONARY ANGIOGRAPHY N/A 04/18/2018   Procedure: LEFT HEART CATH AND CORONARY ANGIOGRAPHY;  Surgeon: Leonie Man, MD;  Location: [place] [facility];  Service: Cardiovascular;  Laterality: N/A; stable findings on last cath with exception of 75% in-stent restenosis of SVG-RCA ostial stent treated with PTCA.    Marland Kitchen LEFT HEART CATHETERIZATION WITH CORONARY ANGIOGRAM N/A 06/18/2014   Procedure: LEFT HEART CATHETERIZATION WITH CORONARY ANGIOGRAM;  Surgeon: Leonie Man, MD;  Location: [facility] C[facility] LAB;  Service: Cardiovascular;  -- severe disease of SVG-rPDA  . NO PAST SURGERIES    . RADIOLOGY WITH ANESTHESIA N/A 01/24/2015   Procedure: STENT ASSISTED ANGIOPLASTY (RADIOLOGY WITH ANESTHESIA);  Surgeon: Luanne Bras, MD;  Location: [place];  Service: Radiology;  Laterality: N/A;  . RADIOLOGY WITH ANESTHESIA N/A 06/06/2018   Procedure: STENTING;  Surgeon: Luanne Bras, MD;  Location: [place];  Service: Radiology;  Laterality: N/A;  . TONSILLECTOMY    . TRANSTHORACIC ECHOCARDIOGRAM  08/2017; 04/2018:    A) EF 60-65%. Mild LVH. No RWMA. Gr 1 DD. Mod LA dilation.;  B)  EF 35-40%.  Severe LVH.  GRII DD.  Apical  anteroseptal hypokinesis.  Akinesis of the apex.   Past Medical History:  Diagnosis Date  . Abdominal aortic aneurysm ([place])    a. Aortic duplex 06/2014: mild aneurysmal dilatation of proximal abdominal aorta measuring 3.4x3.4cm. No sig change from 2012. F/u due 06/2016;   . AKI (acute kidney injury) ([place]) 03/28/2016  . Anemia    low iron  . Arthritis    "right knee; never bothered me" (03/26/2016)  . 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 CAD in native artery    a. NSTEMI 11/2010 - CABG x2(LIMA to LAD, SVG to PDA). b. NEG Lexi MV 10/24/13, EF 53%, no perfusion abnormality, septal and apical HK noted. c. NSTEMI 05/2014 - s/p DES to SVG-RPDA 06/18/14 (Xience Alpine DES 3.0 x 18 mm -3.35 mm), EF 60-65; d. 03/2016 STEMI/PCI: LM nl, ost LAD 70%, mLCx 50%, pRCA 95% - mRCA 60%, dRPDA 70%, LIMA->LAD ok, o-p SVG->RPDA 100% (3.0x16 Promus DES overlapping prior stent).  . Diastolic dysfunction    a. 03/2016 Echo: EF 60-65%, no rwma, Gr1 DD, triv AI, Ao root 32mm, Asc Ao 51mm, triv MR.  . Dizziness    a. Carotid duplex 03/2014: mild fibrous plaque, no significant stenosis.  . Foley catheter in place    "been wearing it for a couple months now" (03/26/2016)  . HTN (hypertension)   . Hyperlipidemia LDL goal <70   . Medical history non-contributory   . Non-STEMI (non-ST elevated myocardial infarction) ([place]) 11/2010; 03/2018   1. Ostial LAD 70% (to close to [facility] for PCI), subtotal occlusion of the RCA; 2.  In-stent restenosis SVG-RCA -PTCA.  Marland Kitchen Pneumonia    as a baby  . Postoperative atrial fibrillation ([place]) 11/2010   Post CABG, no sign recurrence  . Refusal of blood transfusions as patient is Jehovah's Witness   . 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 Servando Snare)  . Sleep apnea    Not on CPAP. (03/26/2016)  . ST elevation myocardial infarction (STEMI) of inferior wall ([place]) 04/05/2016   Occluded in-stent restenosis/thrombosis of SVG-RCA --> treated with  overlapping Promus DES 3.0 mm 16 mm postdilated 3.0 mm).  . Stroke [facility]) 2014; 01/2015   a. 2014 with mild right hand weakness, nonhemorrhagic per pt.;; b. -  PTA-Stent L ICA 95%   . Type II diabetes mellitus (HCC)    BP (!) 163/86   Pulse (!) 57   Resp 14   Ht 6\' 1"  (1.854 m)   Wt 216 lb (98 kg)   SpO2 97%   BMI 28.50 kg/m   Opioid Risk Score:   Fall Risk Score:  `1  Depression screen PHQ 2/9  Depression screen PHQ 2/9 03/21/2015  Decreased Interest 0  Down, Depressed, Hopeless 0  PHQ - 2 Score 0    Review of Systems  Constitutional: Negative.   HENT: Negative.   Eyes: Negative.   Respiratory: Negative.   Cardiovascular: Negative.   Gastrointestinal: Negative.   Endocrine: Negative.   Genitourinary: Negative.   Musculoskeletal: Negative.   Skin: Negative.   Allergic/Immunologic: Negative.   Neurological: Negative.   Hematological: Negative.   Psychiatric/Behavioral: Negative.   All other systems reviewed and are negative.      Objective:   Physical Exam Vitals signs and nursing note reviewed.  Constitutional:      Appearance: Normal appearance.  Neck:     Musculoskeletal: Normal range of motion and neck supple.  Cardiovascular:     Rate and Rhythm: Normal rate and regular rhythm.     Pulses: Normal pulses.     Heart sounds: Normal heart sounds.  Pulmonary:     Effort: Pulmonary effort is normal.     Breath sounds: Normal breath sounds.  Musculoskeletal:     Comments: Normal Muscle Bulk and Muscle Testing Reveals:  Upper Extremities: Full ROM and Muscle Strength on the Right 4/5 and Left 5/5 Lower Extremities: Right: Decreased ROM and Muscle Strength 4/5 Left Full ROM and Muscle Strength 5/5 Arises from Table with Ease Narrow Based Gait   Skin:    General: Skin is warm and dry.  Neurological:     Mental Status: He is alert and oriented to person, place, and time.  Psychiatric:        Mood and Affect: Mood normal.        Behavior: Behavior normal.            Assessment & Plan:  1. Left Middle Cerebral Artery Stroke/ H/O Stroke: Home Health Therapies with [facility]. Has a F/U appointment with Neurology.  2. CAD/ Coronary Bypass Graft: Cardiology Following. Continue current medication regime.  3. CHF: Cardiology Following. Continue current medication regimen. 3. Hypertension: Continue current medication regime. PCP Following.   4. Dyslipidemia: Continue current medication regimen. PCP Following.  5. Anemia of CKD: Continue current medication regimen. PCP Following.  6. CKD Stage 3: PCP Following.   30 minutes of face to face patient care time was spent during this visit. All questions were encouraged and answered.  F/U in 4- 6 weeks with Dr. Letta Pate

## 2018-06-14 NOTE — Telephone Encounter (Signed)
Izora Gala, OT, [facility] left a message asking for verbal orders for [facility] 1week1 followed by 2week4. Medical record reviewed. Social work note reviewed.  Verbal orders given per office protocol.

## 2018-06-23 ENCOUNTER — Telehealth: Payer: Self-pay | Admitting: *Deleted

## 2018-06-23 NOTE — Telephone Encounter (Signed)
Mark PT  [facility] called for 1wk1 visit for tomorrow 06/24/18 to discharge from [place].  Approval given.

## 2018-06-30 ENCOUNTER — Encounter (INDEPENDENT_AMBULATORY_CARE_PROVIDER_SITE_OTHER): Payer: Self-pay | Admitting: Orthopedic Surgery

## 2018-06-30 ENCOUNTER — Ambulatory Visit (INDEPENDENT_AMBULATORY_CARE_PROVIDER_SITE_OTHER): Payer: Medicare Other | Admitting: Orthopedic Surgery

## 2018-06-30 VITALS — Ht 73.0 in | Wt 216.0 lb

## 2018-06-30 DIAGNOSIS — M1711 Unilateral primary osteoarthritis, right knee: Secondary | ICD-10-CM | POA: Diagnosis not present

## 2018-06-30 DIAGNOSIS — I63512 Cerebral infarction due to unspecified occlusion or stenosis of left middle cerebral artery: Secondary | ICD-10-CM | POA: Diagnosis not present

## 2018-06-30 NOTE — Progress Notes (Signed)
Office Visit Note   Patient: [name]           Date of Birth: [birth year]           MRN: [id number] Visit Date: 06/30/2018              Requested by: Suzan Garibaldi, [address] [place], [place] [address] PCP: Suzan Garibaldi, FNP  Chief Complaint  Patient presents with  . Right Knee - Pain    S/p injection 03/2018      HPI: Patient is a 85 year old gentleman who presents follow-up for osteoarthritis right knee.  He states the last injection did not provide much relief he states the first injection provided a lot of relief.  Patient states he is status post a stroke involving the right upper extremity states his been hospitalized for about 10 days 3 weeks ago.  He is currently on Plavix.  Patient is scheduled for stent placement with Dr. Garen Grams in about a month.  Assessment & Plan: Visit Diagnoses:  1. Unilateral primary osteoarthritis, right knee     Plan: Right knee was injected follow-up as needed.  Discussed with patient's history of several strokes he has an increased risk for any type of surgical intervention for the right knee.  Follow-Up Instructions: Return if symptoms worsen or fail to improve.   Ortho Exam  Patient is alert, oriented, no adenopathy, well-dressed, normal affect, normal respiratory effort. He has examination patient has an antalgic gait.  He has tenderness to palpation around the right knee collaterals and cruciates are stable there is no redness no cellulitis minimal effusion.   Imaging: No results found. No images are attached to the encounter.  Labs: Lab Results  Component Value Date   HGBA1C 8.2 (H) 04/17/2018   HGBA1C 7.1 (H) 03/26/2016   HGBA1C 7.2 (H) 03/26/2016   LABURIC 8.3 12/18/2017   REPTSTATUS 05/25/2018 FINAL 05/24/2018   GRAMSTAIN  12/18/2017    FEW WBC PRESENT, PREDOMINANTLY PMN NO ORGANISMS SEEN    CULT  05/24/2018    NO GROWTH Performed at [facility], [place] [address] [place], [place] [address]    LABORGA PSEUDOMONAS AERUGINOSA (A) 12/18/2017     Lab Results  Component Value Date   ALBUMIN 3.1 (L) 05/30/2018   ALBUMIN 3.4 (L) 05/24/2018   ALBUMIN 4.1 04/27/2018   LABURIC 8.3 12/18/2017    Body mass index is 28.5 kg/m.  Orders:  No orders of the defined types were placed in this encounter.  No orders of the defined types were placed in this encounter.    Procedures: Large Joint Inj: R knee on 06/30/2018 2:46 PM Indications: pain and diagnostic evaluation Details: 22 G 1.5 in needle, anteromedial approach  Arthrogram: No  Medications: 5 mL lidocaine 1 %; 40 mg methylPREDNISolone acetate 40 MG/ML Outcome: tolerated well, no immediate complications Procedure, treatment alternatives, risks and benefits explained, specific risks discussed. Consent was given by the patient. Immediately prior to procedure a time out was called to verify the correct patient, procedure, equipment, support staff and site/side marked as required. Patient was prepped and draped in the usual sterile fashion.      Clinical Data: No additional findings.  ROS:  All other systems negative, except as noted in the HPI. Review of Systems  Objective: Vital Signs: Ht 6\' 1"  (1.854 m)   Wt 216 lb (98 kg)   BMI 28.50 kg/m   Specialty Comments:  No specialty comments available.  PMFS History: Patient  Active Problem List   Diagnosis Date Noted  . Hypertensive crisis   . Anemia of chronic disease   . CKD (chronic kidney disease), stage III ([place])   . Poorly controlled type 2 diabetes mellitus with peripheral neuropathy ([place])   . Left middle cerebral artery stroke ([place]) 05/27/2018  . Vitamin D deficiency 05/27/2018  . Acute CVA (cerebrovascular accident) ([place]) 05/25/2018  . Right arm weakness 05/25/2018  . Right sided weakness   . Coronary artery disease involving coronary bypass graft of native heart without angina pectoris   . History of CVA with residual deficit   . Acute blood loss  anemia   . Dilated cardiomyopathy ([place]) 04/26/2018  . Fatigue 04/26/2018  . Chronic combined systolic and diastolic CHF (congestive heart failure) ([place])   . Leukocytosis 04/17/2018  . Chronic diastolic CHF (congestive heart failure) ([place]) 04/17/2018  . History of suprapubic catheter 04/17/2018  . Chronic anemia 04/17/2018  . Chest pain 01/28/2018  . Pseudomonas urinary tract infection   . Hydrocele, right   . Olecranon bursitis of left elbow   . CKD (chronic kidney disease) stage 3, GFR 30-59 ml/min (HCC)   . Suprapubic catheter ([place])   . History of urethral stricture   . Hemispheric carotid artery syndrome 12/22/2016  . AKI (acute kidney injury) ([place]) 03/28/2016  . S/P angioplasty with stent 03/26/16 to VG to PDA for in-stent restenosis with DES. 03/28/2016  . History of ST elevation myocardial infarction (STEMI) 03/28/2016  . Carpal tunnel syndrome of right wrist 06/24/2015  . History of stroke 03/21/2015  . Diabetes mellitus treated with oral medication ([place]) 03/21/2015  . Stenosis of left carotid artery   . Essential hypertension   . Hyperlipidemia LDL goal <70   . Cerebral infarction due to stenosis of left middle cerebral artery ([place]) 01/22/2015  . CAD S/P percutaneous coronary angioplasty 09/19/2014  . Coronary artery disease involving coronary bypass graft of native heart with angina pectoris ([place]) 09/19/2014  . Shortness of breath 09/19/2014  . Polypharmacy 09/19/2014  . Abdominal aortic aneurysm ([place])   . Stroke ([place])   . Hyperlipidemia associated with type 2 diabetes mellitus ([place])   . Type II diabetes mellitus with complication ([place])   . Coronary artery disease involving native coronary artery of native heart with angina pectoris ([place])   . Abnormal TSH 06/19/2014  . Obesity 06/19/2014  . STEMI (ST elevation myocardial infarction) ([place]) 06/18/2014  . NSTEMI (non-ST elevated myocardial infarction) ([place]) 06/17/2014  . Bilateral lower extremity edema 03/30/2014  .  Dizziness 02/21/2014  . Bradycardia 08/31/2013  . Postoperative atrial fibrillation (HCC)     Class: Temporary  . Diabetes mellitus type 2 in obese ([place])   . Apnea, sleep   . Balanitis xerotica obliterans   . History of: Non-STEMI (non-ST elevated myocardial infarction) 11/16/2010    Class: History of  . S/P CABG x 2 11/16/2010   Past Medical History:  Diagnosis Date  . Abdominal aortic aneurysm ([place])    a. Aortic duplex 06/2014: mild aneurysmal dilatation of proximal abdominal aorta measuring 3.4x3.4cm. No sig change from 2012. F/u due 06/2016;   . AKI (acute kidney injury) ([place]) 03/28/2016  . Anemia    low iron  . Arthritis    "right knee; never bothered me" (03/26/2016)  . 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 CAD in native artery    a. NSTEMI 11/2010 - CABG x2(LIMA to LAD, SVG to PDA). b. NEG Lexi  MV 10/24/13, EF 53%, no perfusion abnormality, septal and apical HK noted. c. NSTEMI 05/2014 - s/p DES to SVG-RPDA 06/18/14 (Xience Alpine DES 3.0 x 18 mm -3.35 mm), EF 60-65; d. 03/2016 STEMI/PCI: LM nl, ost LAD 70%, mLCx 50%, pRCA 95% - mRCA 60%, dRPDA 70%, LIMA->LAD ok, o-p SVG->RPDA 100% (3.0x16 Promus DES overlapping prior stent).  . Diastolic dysfunction    a. 03/2016 Echo: EF 60-65%, no rwma, Gr1 DD, triv AI, Ao root 15mm, Asc Ao 45mm, triv MR.  . Dizziness    a. Carotid duplex 03/2014: mild fibrous plaque, no significant stenosis.  . Foley catheter in place    "been wearing it for a couple months now" (03/26/2016)  . HTN (hypertension)   . Hyperlipidemia LDL goal <70   . Medical history non-contributory   . Non-STEMI (non-ST elevated myocardial infarction) ([place]) 11/2010; 03/2018   1. Ostial LAD 70% (to close to [facility] for PCI), subtotal occlusion of the RCA; 2.  In-stent restenosis SVG-RCA -PTCA.  Marland Kitchen Pneumonia    as a baby  . Postoperative atrial fibrillation ([place]) 11/2010   Post CABG, no sign recurrence  . Refusal of blood transfusions as patient  is Jehov[employer]'s Witness   . 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 Servando Snare)  . Sleep apnea    Not on CPAP. (03/26/2016)  . ST elevation myocardial infarction (STEMI) of inferior wall ([place]) 04/05/2016   Occluded in-stent restenosis/thrombosis of SVG-RCA --> treated with overlapping Promus DES 3.0 mm 16 mm postdilated 3.0 mm).  . Stroke [facility]) 2014; 01/2015   a. 2014 with mild right hand weakness, nonhemorrhagic per pt.;; b. - PTA-Stent L ICA 95%   . Type II diabetes mellitus (HCC)     Family History  Problem Relation Age of Onset  . Heart Problems Father 42    Past Surgical History:  Procedure Laterality Date  . APPENDECTOMY    . CARDIAC CATHETERIZATION  12/11/2010   Dr. Chase Picket - subsequent cath - normal LV systolic function, no renal artery stenosis, severe 2-vessel disease with subtotaled RCA prox and distal 60% lesion and complex 70% area of narrowing of ostium of LAD  . CARDIAC CATHETERIZATION N/A 03/26/2016   Procedure: Left Heart Cath and Coronary Angiography;  Surgeon: Nelva Bush, MD;  Location: [place] [facility];  Service: Cardiovascular;  Laterality: N/A;  . CARDIAC CATHETERIZATION N/A 03/26/2016   Procedure: Coronary Stent Intervention;  Surgeon: Nelva Bush, MD;  Location: [place] [facility];  Service: Cardiovascular: 100% In-stent thrombosis of pros SVG-RCA (Xience DES) --> treated with PromusDES 3.0 x 18 (3.3 mm)  . CARDIAC CATHETERIZATION N/A 03/26/2016   Procedure: Bypass Graft Angiography;  Surgeon: Nelva Bush, MD;  Location: [place] [facility];  Service: Cardiovascular;  Laterality: N/A;  . CORONARY ANGIOPLASTY WITH STENT PLACEMENT  06/18/2014   PCI to SVG-RPDA 06/18/14 (Xience Alpine DES 3.0 x 18 mm -3.35 mm),   . CORONARY ARTERY BYPASS GRAFT  12/15/2010   X2, Dr Servando Snare; LIMA to LAD, SVG to PDA;   . CORONARY BALLOON ANGIOPLASTY N/A 04/18/2018   Procedure: CORONARY BALLOON ANGIOPLASTY;  Surgeon: Leonie Man, MD;  Location: [place] [facility];   Service: Cardiovascular;;; high pressure scoring and noncompliant balloon PTCA of SVG-RCA ISR ostial and proximal  . CORONARY/GRAFT ANGIOGRAPHY N/A 08/27/2017   Procedure: CORONARY/GRAFT ANGIOGRAPHY;  Surgeon: Nelva Bush, MD;  Location: [place] [facility];  Service: Cardiovascular;; pLAD 70%, ostD1 50%.  mCx 60%, OM2 80%.  pRCA 95% & mRCA 100% - rPDA 70%. LIMA-mLAD patent. SVG-rPDA 10% ISR.   Marland Kitchen CYSTOSCOPY WITH URETHRAL DILATATION    . IR ANGIO INTRA EXTRACRAN SEL COM CAROTID INNOMINATE BILAT MOD SED  02/25/2017  . IR ANGIO INTRA EXTRACRAN SEL COM CAROTID INNOMINATE BILAT MOD SED  10/19/2017   Dr. Kathee Delton: L Common Carotid - ECA & major branches widely patent. ICA ~20% distal to bulb & 50% in supraclinoid segment. LMCA-distal 1/3 MI ~905 stenosis with post-stenotic dilation into inferior division. ~50% prox Basilar A stenosis @ anterior Inf Cerebellar A. 50% R ICA  . IR ANGIO INTRA EXTRACRAN SEL COM CAROTID INNOMINATE UNI L MOD SED  06/06/2018  . IR ANGIO VERTEBRAL SEL SUBCLAVIAN INNOMINATE UNI L MOD SED  02/25/2017  . IR ANGIO VERTEBRAL SEL SUBCLAVIAN INNOMINATE UNI R MOD SED  10/19/2017  . IR ANGIO VERTEBRAL SEL VERTEBRAL UNI L MOD SED  10/19/2017  . IR GENERIC HISTORICAL  01/21/2016   IR RADIOLOGIST EVAL & MGMT 01/21/2016 [facility]  . IR GENERIC HISTORICAL  02/03/2016   IR CATHETER TUBE CHANGE 02/03/2016 Marybelle Killings, MD [facility]  . IR RADIOLOGIST EVAL & MGMT  11/09/2017  . LEFT HEART CATH AND CORONARY ANGIOGRAPHY N/A 04/18/2018   Procedure: LEFT HEART CATH AND CORONARY ANGIOGRAPHY;  Surgeon: Leonie Man, MD;  Location: [place] [facility];  Service: Cardiovascular;  Laterality: N/A; stable findings on last cath with exception of 75% in-stent restenosis of SVG-RCA ostial stent treated with PTCA.    Marland Kitchen LEFT HEART CATHETERIZATION WITH CORONARY ANGIOGRAM N/A 06/18/2014   Procedure: LEFT HEART CATHETERIZATION WITH CORONARY ANGIOGRAM;  Surgeon: Leonie Man, MD;  Location[facility] LAB;  Service:  Cardiovascular;  -- severe disease of SVG-rPDA  . NO PAST SURGERIES    . RADIOLOGY WITH ANESTHESIA N/A 01/24/2015   Procedure: STENT ASSISTED ANGIOPLASTY (RADIOLOGY WITH ANESTHESIA);  Surgeon: Luanne Bras, MD;  Location: [facility];  Service: Radiology;  Laterality: N/A;  . RADIOLOGY WITH ANESTHESIA N/A 06/06/2018   Procedure: STENTING;  Surgeon: Luanne Bras, MD;  Location: [facility];  Service: Radiology;  Laterality: N/A;  . TONSILLECTOMY    . TRANSTHORACIC ECHOCARDIOGRAM  08/2017; 04/2018:    A) EF 60-65%. Mild LVH. No RWMA. Gr 1 DD. Mod LA dilation.;  B)  EF 35-40%.  Severe LVH.  GRII DD.  Apical anteroseptal hypokinesis.  Akinesis of the apex.   Social History   Occupational History    Employer: RETIRED  Tobacco Use  . Smoking status: Former Smoker    Packs/day: 0.33    Years: 10.00    Pack years: 3.30    Types: Cigarettes    Last attempt to quit: 1961    Years since quitting: 59.1  . Smokeless tobacco: Never Used  Substance and Sexual Activity  . Alcohol use: No  . Drug use: No  . Sexual activity: Never

## 2018-07-01 ENCOUNTER — Telehealth: Payer: Self-pay | Admitting: *Deleted

## 2018-07-01 MED ORDER — METHYLPREDNISOLONE ACETATE 40 MG/ML IJ SUSP
40.0000 mg | INTRAMUSCULAR | Status: AC | PRN
  Administered 2018-06-30: 40 mg via INTRA_ARTICULAR

## 2018-07-01 MED ORDER — LIDOCAINE HCL 1 % IJ SOLN
5.0000 mL | INTRAMUSCULAR | Status: AC | PRN
  Administered 2018-06-30: 5 mL

## 2018-07-01 NOTE — Telephone Encounter (Signed)
Izora Gala PT [facility] called to request one additional visit 1wk1 to do agency discharge. Approval given.

## 2018-07-11 DIAGNOSIS — I69354 Hemiplegia and hemiparesis following cerebral infarction affecting left non-dominant side: Secondary | ICD-10-CM

## 2018-07-11 DIAGNOSIS — Z7982 Long term (current) use of aspirin: Secondary | ICD-10-CM

## 2018-07-11 DIAGNOSIS — I130 Hypertensive heart and chronic kidney disease with heart failure and stage 1 through stage 4 chronic kidney disease, or unspecified chronic kidney disease: Secondary | ICD-10-CM

## 2018-07-11 DIAGNOSIS — I69351 Hemiplegia and hemiparesis following cerebral infarction affecting right dominant side: Secondary | ICD-10-CM

## 2018-07-11 DIAGNOSIS — I2510 Atherosclerotic heart disease of native coronary artery without angina pectoris: Secondary | ICD-10-CM

## 2018-07-11 DIAGNOSIS — E1122 Type 2 diabetes mellitus with diabetic chronic kidney disease: Secondary | ICD-10-CM

## 2018-07-11 DIAGNOSIS — I69353 Hemiplegia and hemiparesis following cerebral infarction affecting right non-dominant side: Secondary | ICD-10-CM

## 2018-07-11 DIAGNOSIS — G4730 Sleep apnea, unspecified: Secondary | ICD-10-CM

## 2018-07-11 DIAGNOSIS — Z951 Presence of aortocoronary bypass graft: Secondary | ICD-10-CM

## 2018-07-11 DIAGNOSIS — N189 Chronic kidney disease, unspecified: Secondary | ICD-10-CM

## 2018-07-11 DIAGNOSIS — E785 Hyperlipidemia, unspecified: Secondary | ICD-10-CM

## 2018-07-11 DIAGNOSIS — I252 Old myocardial infarction: Secondary | ICD-10-CM

## 2018-07-11 DIAGNOSIS — M1711 Unilateral primary osteoarthritis, right knee: Secondary | ICD-10-CM | POA: Diagnosis not present

## 2018-07-11 DIAGNOSIS — E1142 Type 2 diabetes mellitus with diabetic polyneuropathy: Secondary | ICD-10-CM

## 2018-07-11 DIAGNOSIS — Z87891 Personal history of nicotine dependence: Secondary | ICD-10-CM

## 2018-07-11 DIAGNOSIS — Z435 Encounter for attention to cystostomy: Secondary | ICD-10-CM

## 2018-07-11 DIAGNOSIS — I714 Abdominal aortic aneurysm, without rupture: Secondary | ICD-10-CM

## 2018-07-11 DIAGNOSIS — Z7902 Long term (current) use of antithrombotics/antiplatelets: Secondary | ICD-10-CM

## 2018-07-11 DIAGNOSIS — D631 Anemia in chronic kidney disease: Secondary | ICD-10-CM

## 2018-07-11 DIAGNOSIS — I5030 Unspecified diastolic (congestive) heart failure: Secondary | ICD-10-CM

## 2018-07-11 DIAGNOSIS — Z9181 History of falling: Secondary | ICD-10-CM

## 2018-07-11 DIAGNOSIS — Z7984 Long term (current) use of oral hypoglycemic drugs: Secondary | ICD-10-CM

## 2018-07-12 ENCOUNTER — Encounter: Payer: Self-pay | Admitting: Cardiology

## 2018-07-12 ENCOUNTER — Telehealth: Payer: Self-pay | Admitting: Cardiology

## 2018-07-12 NOTE — Telephone Encounter (Signed)
New message ° ° ° ° °

## 2018-07-12 NOTE — Telephone Encounter (Signed)
error 

## 2018-07-19 ENCOUNTER — Ambulatory Visit (HOSPITAL_BASED_OUTPATIENT_CLINIC_OR_DEPARTMENT_OTHER): Payer: Medicare Other | Admitting: Physical Medicine & Rehabilitation

## 2018-07-19 ENCOUNTER — Other Ambulatory Visit: Payer: Self-pay

## 2018-07-19 ENCOUNTER — Encounter: Payer: Self-pay | Admitting: Physical Medicine & Rehabilitation

## 2018-07-19 ENCOUNTER — Encounter: Payer: Medicare Other | Attending: Physical Medicine & Rehabilitation

## 2018-07-19 VITALS — BP 183/86 | HR 49 | Ht 73.0 in | Wt 210.0 lb

## 2018-07-19 DIAGNOSIS — I63512 Cerebral infarction due to unspecified occlusion or stenosis of left middle cerebral artery: Secondary | ICD-10-CM

## 2018-07-19 DIAGNOSIS — N183 Chronic kidney disease, stage 3 (moderate): Secondary | ICD-10-CM | POA: Diagnosis present

## 2018-07-19 DIAGNOSIS — I25810 Atherosclerosis of coronary artery bypass graft(s) without angina pectoris: Secondary | ICD-10-CM | POA: Insufficient documentation

## 2018-07-19 DIAGNOSIS — E785 Hyperlipidemia, unspecified: Secondary | ICD-10-CM | POA: Insufficient documentation

## 2018-07-19 DIAGNOSIS — D638 Anemia in other chronic diseases classified elsewhere: Secondary | ICD-10-CM | POA: Diagnosis present

## 2018-07-19 DIAGNOSIS — I5032 Chronic diastolic (congestive) heart failure: Secondary | ICD-10-CM | POA: Diagnosis present

## 2018-07-19 DIAGNOSIS — Z8673 Personal history of transient ischemic attack (TIA), and cerebral infarction without residual deficits: Secondary | ICD-10-CM | POA: Insufficient documentation

## 2018-07-19 DIAGNOSIS — I10 Essential (primary) hypertension: Secondary | ICD-10-CM | POA: Insufficient documentation

## 2018-07-19 NOTE — Progress Notes (Signed)
Subjective:    Patient ID: [name], male    DOB: 04-24-1931, 85 y.o.   MRN: [id number] 85 year old right-handed male with history of CAD with CABG, diastolic congestive heart failure.  Suprapubic catheter x1 year.  Diabetes mellitus, CVA 2016 with right-sided weakness maintained on aspirin and  Plavix, previous balloon angioplasty of left ICA, petrous segment 01/25/2015,  and also history of chronic renal insufficiency.  He lives with spouse, independent and driving.  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  He has a daughter in the  area.  Presented 05/24/2018 with right-sided weakness.  Cranial CT scan unremarkable for acute intracranial process.  Per report stable small chronic left frontoparietal infarction.  CT angiogram of head and neck with no emergent large vessel occlusion,  60% stenosis of proximal left ICA and severe stenosis of distal left M1 segment with preserved distal patency.  MRI of the brain, multifocal acute ischemia within the left MCA territory as well as additional small focus of ischemia in the right frontal  white matter.  Carotid Dopplers with no ICA stenosis.  Echocardiogram with ejection fraction of 55%, no wall motion abnormality, grade I diastolic dysfunction.  Maintained on aspirin and Plavix for CVA prophylaxis.  Subcutaneous Lovenox for DVT  prophylaxis.  Interventional radiology consulted 05/26/2018 after CT angiogram of head and neck reviewed.  Current plan for outpatient cerebral angiogram.  Question need for angioplasty stenting 06/06/2018,  HPI   DATE OF DISCHARGE:  06/01/2018  No intracranial stent placed due to anemia.  Seen by PCP in [place] and has been on Fe tablets.  Some constipation wit Fe pills. Walking with cane on occasion.  No falls at home.  Dressing and bathing Mod I.  Needs help yardwork but can cook and clean  RIght knee pain chronic, sees orthopedics, Dr. Sharol Given for this.  Has undergone Visco supplementation without much  improvement.  Patient has a dry mouth we did reviewed his medications and Ditropan likely contributing factor.  This has been prescribed by urology.  He is status post suprapubic catheter placement and the patient is wondering whether suprapubic catheter can come out. Pain Inventory Average Pain 0 Pain Right Now 0 My pain is na  In the last 24 hours, has pain interfered with the following? General activity 0 Relation with others 0 Enjoyment of life 0 What TIME of day is your pain at its worst? na Sleep (in general) Good  Pain is worse with: na Pain improves with: na Relief from Meds: na  Mobility walk with assistance use a cane how many minutes can you walk? 60 ability to climb steps?  yes do you drive?  yes  Function retired  Neuro/Psych weakness numbness trouble walking dizziness  Prior Studies Any changes since last visit?  no  Physicians involved in your care Any changes since last visit?  no   Family History  Problem Relation Age of Onset  . Heart Problems Father 54   Social History   Socioeconomic History  . Marital status: Married    Spouse name: [name]  . Number of children: 1  . Years of education: Not on file  . Highest education level: Not on file  Occupational History    Employer: RETIRED  Social Needs  . Financial resource strain: Not very hard  . Food insecurity:    Worry: Never true    Inability: Never true  . Transportation needs:    Medical: No    Non-medical: No  Tobacco Use  . Smoking status: Former Smoker    Packs/day: 0.33    Years: 10.00    Pack years: 3.30    Types: Cigarettes    Last attempt to quit: 1961    Years since quitting: 59.2  . Smokeless tobacco: Never Used  Substance and Sexual Activity  . Alcohol use: No  . Drug use: No  . Sexual activity: Never  Lifestyle  . Physical activity:    Days per week: 6 days    Minutes per session: 30 min  . Stress: Not at all  Relationships  . Social connections:    Talks  on phone: More than three times a week    Gets together: More than three times a week    Attends religious service: 1 to 4 times per year    Active member of club or organization: Yes    Attends meetings of clubs or organizations: 1 to 4 times per year    Relationship status: Married  Other Topics Concern  . Not on file  Social History Narrative   Married father of one.  Previously uses stationary bike routinely.  Now reduced due to other social stressors.   Quit smoking 50 years ago.  Does not drink alcohol   Past Surgical History:  Procedure Laterality Date  . APPENDECTOMY    . CARDIAC CATHETERIZATION  12/11/2010   Dr. Chase Picket - subsequent cath - normal LV systolic function, no renal artery stenosis, severe 2-vessel disease with subtotaled RCA prox and distal 60% lesion and complex 70% area of narrowing of ostium of LAD  . CARDIAC CATHETERIZATION N/A 03/26/2016   Procedure: Left Heart Cath and Coronary Angiography;  Surgeon: Nelva Bush, MD;  Location: [place] [facility];  Service: Cardiovascular;  Laterality: N/A;  . CARDIAC CATHETERIZATION N/A 03/26/2016   Procedure: Coronary Stent Intervention;  Surgeon: Nelva Bush, MD;  Location: [place] [facility];  Service: Cardiovascular: 100% In-stent thrombosis of pros SVG-RCA (Xience DES) --> treated with PromusDES 3.0 x 18 (3.3 mm)  . CARDIAC CATHETERIZATION N/A 03/26/2016   Procedure: Bypass Graft Angiography;  Surgeon: Nelva Bush, MD;  Location: [place] [facility];  Service: Cardiovascular;  Laterality: N/A;  . CORONARY ANGIOPLASTY WITH STENT PLACEMENT  06/18/2014   PCI to SVG-RPDA 06/18/14 (Xience Alpine DES 3.0 x 18 mm -3.35 mm),   . CORONARY ARTERY BYPASS GRAFT  12/15/2010   X2, Dr Servando Snare; LIMA to LAD, SVG to PDA;   . CORONARY BALLOON ANGIOPLASTY N/A 04/18/2018   Procedure: CORONARY BALLOON ANGIOPLASTY;  Surgeon: Leonie Man, MD;  Location: [place];  Service: Cardiovascular;;; high pressure scoring and  noncompliant balloon PTCA of SVG-RCA ISR ostial and proximal  . CORONARY/GRAFT ANGIOGRAPHY N/A 08/27/2017   Procedure: CORONARY/GRAFT ANGIOGRAPHY;  Surgeon: Nelva Bush, MD;  Location: [place] [facility];  Service: Cardiovascular;; pLAD 70%, ostD1 50%.  mCx 60%, OM2 80%. pRCA 95% & mRCA 100% - rPDA 70%. LIMA-mLAD patent. SVG-rPDA 10% ISR.   Marland Kitchen CYSTOSCOPY WITH URETHRAL DILATATION    . IR ANGIO INTRA EXTRACRAN SEL COM CAROTID INNOMINATE BILAT MOD SED  02/25/2017  . IR ANGIO INTRA EXTRACRAN SEL COM CAROTID INNOMINATE BILAT MOD SED  10/19/2017   Dr. Kathee Delton: L Common Carotid - ECA & major branches widely patent. ICA ~20% distal to bulb & 50% in supraclinoid segment. LMCA-distal 1/3 MI ~905 stenosis with post-stenotic dilation into inferior division. ~50% prox Basilar A stenosis @ anterior Inf Cerebellar A. 50% R ICA  .  IR ANGIO INTRA EXTRACRAN SEL COM CAROTID INNOMINATE UNI L MOD SED  06/06/2018  . IR ANGIO VERTEBRAL SEL SUBCLAVIAN INNOMINATE UNI L MOD SED  02/25/2017  . IR ANGIO VERTEBRAL SEL SUBCLAVIAN INNOMINATE UNI R MOD SED  10/19/2017  . IR ANGIO VERTEBRAL SEL VERTEBRAL UNI L MOD SED  10/19/2017  . IR GENERIC HISTORICAL  01/21/2016   IR RADIOLOGIST EVAL & MGMT 01/21/2016 [facility]  . IR GENERIC HISTORICAL  02/03/2016   IR CATHETER TUBE CHANGE 02/03/2016 Marybelle Killings, MD [facility]  . IR RADIOLOGIST EVAL & MGMT  11/09/2017  . LEFT HEART CATH AND CORONARY ANGIOGRAPHY N/A 04/18/2018   Procedure: LEFT HEART CATH AND CORONARY ANGIOGRAPHY;  Surgeon: Leonie Man, MD;  Location: [place] [facility];  Service: Cardiovascular;  Laterality: N/A; stable findings on last cath with exception of 75% in-stent restenosis of SVG-RCA ostial stent treated with PTCA.    Marland Kitchen LEFT HEART CATHETERIZATION WITH CORONARY ANGIOGRAM N/A 06/18/2014   Procedure: LEFT HEART CATHETERIZATION WITH CORONARY ANGIOGRAM;  Surgeon: Leonie Man, MD;  Location: [facility];  Service: Cardiovascular;  -- severe disease of SVG-rPDA  . NO  PAST SURGERIES    . RADIOLOGY WITH ANESTHESIA N/A 01/24/2015   Procedure: STENT ASSISTED ANGIOPLASTY (RADIOLOGY WITH ANESTHESIA);  Surgeon: Luanne Bras, MD;  Location: [place];  Service: Radiology;  Laterality: N/A;  . RADIOLOGY WITH ANESTHESIA N/A 06/06/2018   Procedure: STENTING;  Surgeon: Luanne Bras, MD;  Location: [place];  Service: Radiology;  Laterality: N/A;  . TONSILLECTOMY    . TRANSTHORACIC ECHOCARDIOGRAM  08/2017; 04/2018:    A) EF 60-65%. Mild LVH. No RWMA. Gr 1 DD. Mod LA dilation.;  B)  EF 35-40%.  Severe LVH.  GRII DD.  Apical anteroseptal hypokinesis.  Akinesis of the apex.   Past Medical History:  Diagnosis Date  . Abdominal aortic aneurysm ([place])    a. Aortic duplex 06/2014: mild aneurysmal dilatation of proximal abdominal aorta measuring 3.4x3.4cm. No sig change from 2012. F/u due 06/2016;   . AKI (acute kidney injury) ([place]) 03/28/2016  . Anemia    low iron  . Arthritis    "right knee; never bothered me" (03/26/2016)  . 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 CAD in native artery    a. NSTEMI 11/2010 - CABG x2(LIMA to LAD, SVG to PDA). b. NEG Lexi MV 10/24/13, EF 53%, no perfusion abnormality, septal and apical HK noted. c. NSTEMI 05/2014 - s/p DES to SVG-RPDA 06/18/14 (Xience Alpine DES 3.0 x 18 mm -3.35 mm), EF 60-65; d. 03/2016 STEMI/PCI: LM nl, ost LAD 70%, mLCx 50%, pRCA 95% - mRCA 60%, dRPDA 70%, LIMA->LAD ok, o-p SVG->RPDA 100% (3.0x16 Promus DES overlapping prior stent).  . Diastolic dysfunction    a. 03/2016 Echo: EF 60-65%, no rwma, Gr1 DD, triv AI, Ao root 4mm, Asc Ao 12mm, triv MR.  . Dizziness    a. Carotid duplex 03/2014: mild fibrous plaque, no significant stenosis.  . Foley catheter in place    "been wearing it for a couple months now" (03/26/2016)  . HTN (hypertension)   . Hyperlipidemia LDL goal <70   . Medical history non-contributory   . Non-STEMI (non-ST elevated myocardial infarction) ([place]) 11/2010; 03/2018    1. Ostial LAD 70% (to close to [facility] for PCI), subtotal occlusion of the RCA; 2.  In-stent restenosis SVG-RCA -PTCA.  Marland Kitchen Pneumonia    as a baby  . Postoperative atrial fibrillation ([facility])  11/2010   Post CABG, no sign recurrence  . Refusal of blood transfusions as patient is Jehovah's Witness   . 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 Servando Snare)  . Sleep apnea    Not on CPAP. (03/26/2016)  . ST elevation myocardial infarction (STEMI) of inferior wall ([place]) 04/05/2016   Occluded in-stent restenosis/thrombosis of SVG-RCA --> treated with overlapping Promus DES 3.0 mm 16 mm postdilated 3.0 mm).  . Stroke [facility] 2014; 01/2015   a. 2014 with mild right hand weakness, nonhemorrhagic per pt.;; b. - PTA-Stent L ICA 95%   . Type II diabetes mellitus (HCC)    BP (!) 183/86   Pulse (!) 49   Ht 6\' 1"  (1.854 m)   Wt 210 lb (95.3 kg)   SpO2 97%   BMI 27.71 kg/m   Opioid Risk Score:   Fall Risk Score:  `1  Depression screen PHQ 2/9  Depression screen [facility] 2/9 07/19/2018 03/21/2015  Decreased Interest 0 0  Down, Depressed, Hopeless 0 0  PHQ - 2 Score 0 0     Review of Systems  Constitutional: Negative.   HENT: Negative.   Eyes: Negative.   Respiratory: Negative.   Cardiovascular: Negative.   Gastrointestinal: Negative.   Endocrine: Negative.   Genitourinary: Negative.   Musculoskeletal: Negative.   Skin: Negative.   Allergic/Immunologic: Negative.   Neurological: Positive for dizziness, weakness and numbness.  Hematological: Negative.   Psychiatric/Behavioral: Negative.   All other systems reviewed and are negative.      Objective:   Physical Exam Vitals signs and nursing note reviewed.  Constitutional:      Appearance: Normal appearance. He is normal weight.  HENT:     Head: Normocephalic and atraumatic.     Mouth/Throat:     Mouth: Mucous membranes are moist.     Pharynx: Oropharynx is clear.  Eyes:     Extraocular Movements: Extraocular movements intact.      Pupils: Pupils are equal, round, and reactive to light.  Skin:    General: Skin is dry.  Neurological:     General: No focal deficit present.     Mental Status: He is alert and oriented to person, place, and time.  Psychiatric:        Mood and Affect: Mood normal.        Behavior: Behavior normal.   Right knee without evidence of effusion.  There is pain with end range extension.  He has good knee flexion without pain.  Ambulates without assistive device no evidence of toe drag Motor strength is 4/5 in the right deltoid bicep tricep grip 5/5 in the left deltoid bicep tricep grip 5/5 in bilateral hip flexors knee extensors ankle dorsiflexors Patient has decreased finger to thumb opposition in the right hand.  He has positive dysdiadochokinesis. No evidence of dysmetria finger-nose-finger testing bilaterally.        Assessment & Plan:  1.  Left MCA infarct with right hemiparesis, functionally has improved to modified independent level only needs help with yard work, does not drive. He is also functionally impeded by his right knee osteoarthritis and follows up with orthopedics for this. If his right knee pain cannot be controlled with injections or other conservative care he may be a candidate for genicular nerve block under fluoroscopic guidance.  Given his recent stroke elective surgery should be put off until 6 months post stroke. Patient also may need to undergo stenting of left M1 per Dr.  Deveshwar  We discussed outpatient therapy.  He may potentially benefit from occupational therapy to improve right upper extremity fine motor skills however he does not wish to pursue this.  Home health therapy has been discontinued given his lack of homebound status. Physical medicine rehab follow-up on as-needed basis Follow-up with PCP Follow-up with interventional radiology Follow-up with neurology Follow-up with cardiology Follow-up with orthopedics Follow-up with urology

## 2018-07-19 NOTE — Patient Instructions (Signed)
oxybutnin (Ditropan) may cause dryness in the mouth

## 2018-07-21 ENCOUNTER — Ambulatory Visit (INDEPENDENT_AMBULATORY_CARE_PROVIDER_SITE_OTHER): Payer: Medicare Other | Admitting: Cardiology

## 2018-07-21 ENCOUNTER — Telehealth (HOSPITAL_COMMUNITY): Payer: Self-pay | Admitting: Radiology

## 2018-07-21 ENCOUNTER — Telehealth: Payer: Self-pay | Admitting: Cardiology

## 2018-07-21 ENCOUNTER — Encounter: Payer: Self-pay | Admitting: Cardiology

## 2018-07-21 VITALS — BP 152/82 | HR 55 | Ht 73.0 in | Wt 214.4 lb

## 2018-07-21 DIAGNOSIS — I739 Peripheral vascular disease, unspecified: Secondary | ICD-10-CM

## 2018-07-21 DIAGNOSIS — I2510 Atherosclerotic heart disease of native coronary artery without angina pectoris: Secondary | ICD-10-CM

## 2018-07-21 DIAGNOSIS — N183 Chronic kidney disease, stage 3 unspecified: Secondary | ICD-10-CM

## 2018-07-21 DIAGNOSIS — Z8673 Personal history of transient ischemic attack (TIA), and cerebral infarction without residual deficits: Secondary | ICD-10-CM

## 2018-07-21 DIAGNOSIS — Z9861 Coronary angioplasty status: Secondary | ICD-10-CM

## 2018-07-21 DIAGNOSIS — I10 Essential (primary) hypertension: Secondary | ICD-10-CM | POA: Diagnosis not present

## 2018-07-21 DIAGNOSIS — Z951 Presence of aortocoronary bypass graft: Secondary | ICD-10-CM

## 2018-07-21 MED ORDER — CARVEDILOL 6.25 MG PO TABS: 6.2500 mg | ORAL_TABLET | Freq: Two times a day (BID) | ORAL | 3 refills | Status: DC

## 2018-07-21 MED ORDER — DOXAZOSIN MESYLATE 2 MG PO TABS: 2.0000 mg | ORAL_TABLET | Freq: Every day | ORAL | 3 refills | Status: DC

## 2018-07-21 NOTE — Assessment & Plan Note (Signed)
NSTEMI 05/2014 - s/p DES to SVG-RPDA 06/18/14, EF 60-65%; -> Last Cath 08/2017 - patent stents in SVG-rPDA.

## 2018-07-21 NOTE — Assessment & Plan Note (Signed)
Sub optimal control-increase medications as noted

## 2018-07-21 NOTE — Telephone Encounter (Signed)
New Message  [name] is calling from Dr Olivia Canter office and was wondering if the PT could have  CBC drawn today at his appt that is scheduled with Lurena Joiner today   CBC is in notes

## 2018-07-21 NOTE — Telephone Encounter (Signed)
[name], after speaking with Darden Dates who stated they could order and complete it here while patient was here.  Will route to T[place] to have note. Thanks!

## 2018-07-21 NOTE — Patient Instructions (Addendum)
Medication Instructions:  INCREASE Coreg to 6.25mg  take 1 tablet twice a day  INCREASE Cardura to 2mg  Take 1 tablet daily at bedtime If you need a refill on your cardiac medications before your next appointment, please call your pharmacy.   Lab work: Your physician recommends that you return for lab work in: [facility] If you have labs (blood work) drawn today and your tests are completely normal, you will receive your results only by: Marland Kitchen MyChart Message (if you have MyChart) OR . A paper copy in the mail If you have any lab test that is abnormal or we need to change your treatment, we will call you to review the results.  Testing/Procedures: Your physician has requested that you have a lower extremity arterial exercise duplex. During this test, exercise and ultrasound are used to evaluate arterial blood flow in the legs. Allow one hour for this exam. There are no restrictions or special instructions.  Your physician has requested that you have an ankle brachial index (ABI). During this test an ultrasound and blood pressure cuff are used to evaluate the arteries that supply the arms and legs with blood. Allow thirty minutes for this exam. There are no restrictions or special instructions.  Follow-Up: At [facility], you and your health needs are our priority.  As part of our continuing mission to provide you with exceptional heart care, we have created designated Provider Care Teams.  These Care Teams include your primary Cardiologist (physician) and Advanced Practice Providers (APPs -  Physician Assistants and Nurse Practitioners) who all work together to provide you with the care you need, when you need it. FOLLOW UP WITH DR HARDING AS SCHEDULED.  Any Other Special Instructions Will Be Listed Below (If Applicable).

## 2018-07-21 NOTE — Assessment & Plan Note (Signed)
a. 2014 with mild right hand weakness, nonhemorrhagic per pt. B. repeat stroke in September 2016 - again right hand weakness, left ICA PTA/stent C. Lt brain CVA 05/24/2018

## 2018-07-21 NOTE — Assessment & Plan Note (Signed)
LIMA-LAD, SVG to PDA (Dr. Servando Snare)

## 2018-07-21 NOTE — Assessment & Plan Note (Signed)
GFR 39

## 2018-07-21 NOTE — Progress Notes (Signed)
07/21/2018 [name]   Feb 09, 1931  [id number]  Primary Physician Suzan Garibaldi, [place] Primary Cardiologist: Dr Ellyn Hack  HPI: [name] is seen in the office today for routine follow-up.  He is a pleasant 85 year old male followed by Dr. Ellyn Hack.  The patient has a history of coronary disease, he had bypass grafting x2 in 2012 with an LIMA to LAD and SVG to PDA.  He had multiple interventions to the SVG to PDA, the last one was a POBA April 18, 2018.  The patient denies any anginal symptoms.  In January 2020 he presented with a new left brain stroke.  He is being followed by Dr. Patrecia Pour.  The plan is for left MCA PCI.  The patient's blood pressure is poorly controlled and he is anemic, etiology is not clear.  His last hemoglobin was 8.1.  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   Current Outpatient Medications  Medication Sig Dispense Refill  . acetaminophen (TYLENOL) 325 MG tablet Take 2 tablets (650 mg total) by mouth every 6 (six) hours as needed for mild pain (or Fever >/= 101).    Marland Kitchen amLODipine (NORVASC) 10 MG tablet Take 1 tablet (10 mg total) by mouth every evening. 30 tablet 0  . aspirin 81 MG chewable tablet Chew 1 tablet (81 mg total) by mouth daily.    . carvedilol (COREG) 6.25 MG tablet Take 1 tablet (6.25 mg total) by mouth 2 (two) times daily with a meal. 60 tablet 3  . clopidogrel (PLAVIX) 75 MG tablet Take 1 tablet (75 mg total) by mouth daily. 90 tablet 3  . doxazosin (CARDURA) 2 MG tablet Take 1 tablet (2 mg total) by mouth daily. 30 tablet 3  . Ferrous Sulfate Dried (FERROUS SULFATE IRON) 200 (65 Fe) MG TABS Take 1 tablet by mouth 2 (two) times daily after a meal.    . furosemide (LASIX) 20 MG tablet Take 1 tablet (20 mg total) by mouth daily. 30 tablet 0  . isosorbide  mononitrate (IMDUR) 60 MG 24 hr tablet Take 1 tablet (60 mg total) by mouth 2 (two) times daily. 60 tablet 0  . metFORMIN (GLUCOPHAGE) 1000 MG tablet Take 1,000 mg by mouth 2 (two) times daily.    . nitroGLYCERIN (NITROSTAT) 0.4 MG SL tablet Place 1 tablet (0.4 mg total) under the tongue every 5 (five) minutes as needed for chest pain (up to 3 doses). (Patient taking differently: Place 0.4 mg under the tongue every 5 (five) minutes x 3 doses as needed for chest pain. ) 25 tablet 3  . oxybutynin (DITROPAN) 5 MG tablet Take 5 mg by mouth daily as needed for bladder spasms.     . rosuvastatin (CRESTOR) 20 MG tablet Take 1 tablet (20 mg total) by mouth daily. 30 tablet 1  . pantoprazole (PROTONIX) 40 MG tablet Take 40 mg by mouth daily.     No current facility-administered medications for this visit.     Allergies  Allergen Reactions  . Other Other (See Comments)    No  BLOOD PRODUCTS - Pt is Jehovah's Witness  . Statins Other (See Comments)    Cause muscle aches  . Atorvastatin Other (See Comments)    Myalgias  . Crestor [Rosuvastatin] Other (See Comments)    Myalgias   . Pravastatin Other (See Comments)  Myalgias  . Simvastatin Other (See Comments)    Myalgias     Past Medical History:  Diagnosis Date  . Abdominal aortic aneurysm ([place])    a. Aortic duplex 06/2014: mild aneurysmal dilatation of proximal abdominal aorta measuring 3.4x3.4cm. No sig change from 2012. F/u due 06/2016;   . AKI (acute kidney injury) ([place]) 03/28/2016  . Anemia    low iron  . Arthritis    "right knee; never bothered me" (03/26/2016)  . 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 CAD in native artery    a. NSTEMI 11/2010 - CABG x2(LIMA to LAD, SVG to PDA). b. NEG Lexi MV 10/24/13, EF 53%, no perfusion abnormality, septal and apical HK noted. c. NSTEMI 05/2014 - s/p DES to SVG-RPDA 06/18/14 (Xience Alpine DES 3.0 x 18 mm -3.35 mm), EF 60-65; d. 03/2016 STEMI/PCI: LM nl, ost LAD  70%, mLCx 50%, pRCA 95% - mRCA 60%, dRPDA 70%, LIMA->LAD ok, o-p SVG->RPDA 100% (3.0x16 Promus DES overlapping prior stent).  . Diastolic dysfunction    a. 03/2016 Echo: EF 60-65%, no rwma, Gr1 DD, triv AI, Ao root 26mm, Asc Ao 35mm, triv MR.  . Dizziness    a. Carotid duplex 03/2014: mild fibrous plaque, no significant stenosis.  . Foley catheter in place    "been wearing it for a couple months now" (03/26/2016)  . HTN (hypertension)   . Hyperlipidemia LDL goal <70   . Medical history non-contributory   . Non-STEMI (non-ST elevated myocardial infarction) ([place]) 11/2010; 03/2018   1. Ostial LAD 70% (to close to [facility] for PCI), subtotal occlusion of the RCA; 2.  In-stent restenosis SVG-RCA -PTCA.  Marland Kitchen Pneumonia    as a baby  . Postoperative atrial fibrillation ([place]) 11/2010   Post CABG, no sign recurrence  . Refusal of blood transfusions as patient is Jehovah's Witness   . 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 Servando Snare)  . Sleep apnea    Not on CPAP. (03/26/2016)  . ST elevation myocardial infarction (STEMI) of inferior wall ([place]) 04/05/2016   Occluded in-stent restenosis/thrombosis of SVG-RCA --> treated with overlapping Promus DES 3.0 mm 16 mm postdilated 3.0 mm).  . Stroke [facility]) 2014; 01/2015   a. 2014 with mild right hand weakness, nonhemorrhagic per pt.;; b. - PTA-Stent L ICA 95%   . Type II diabetes mellitus ([place])     Social History   Socioeconomic History  . Marital status: Married    Spouse name: [name]  . Number of children: 1  . Years of education: Not on file  . Highest education level: Not on file  Occupational History    Employer: RETIRED  Social Needs  . Financial resource strain: Not very hard  . Food insecurity:    Worry: Never true    Inability: Never true  . Transportation needs:    Medical: No    Non-medical: No  Tobacco Use  . Smoking status: Former Smoker    Packs/day: 0.33    Years: 10.00    Pack years: 3.30    Types: Cigarettes    Last  attempt to quit: 1961    Years since quitting: 59.2  . Smokeless tobacco: Never Used  Substance and Sexual Activity  . Alcohol use: No  . Drug use: No  . Sexual activity: Never  Lifestyle  . Physical activity:    Days per week: 6 days    Minutes per session: 30  min  . Stress: Not at all  Relationships  . Social connections:    Talks on phone: More than three times a week    Gets together: More than three times a week    Attends religious service: 1 to 4 times per year    Active member of club or organization: Yes    Attends meetings of clubs or organizations: 1 to 4 times per year    Relationship status: Married  . Intimate partner violence:    Fear of current or ex partner: No    Emotionally abused: No    Physically abused: No    Forced sexual activity: No  Other Topics Concern  . Not on file  Social History Narrative   Married father of one.  Previously uses stationary bike routinely.  Now reduced due to other social stressors.   Quit smoking 50 years ago.  Does not drink alcohol     Family History  Problem Relation Age of Onset  . Heart Problems Father 22     Review of Systems: General: negative for chills, fever, night sweats or weight changes.  Cardiovascular: negative for chest pain, dyspnea on exertion, edema, orthopnea, palpitations, paroxysmal nocturnal dyspnea or shortness of breath Dermatological: negative for rash Respiratory: negative for cough or wheezing Urologic: negative for hematuria Abdominal: negative for nausea, vomiting, diarrhea, bright red blood per rectum, melena, or hematemesis Neurologic: negative for visual changes, syncope, or dizziness The patient now complains of right calf pain with exertion.  Initially thought this was from his knee but now he thinks it is from circulation after looking at our PV circulation signs and symptoms chart. All other systems reviewed and are otherwise negative except as noted above.    Blood pressure (!)  152/82, pulse (!) 55, height 6\' 1"  (1.854 m), weight 214 lb 6.4 oz (97.3 kg).  General appearance: alert, cooperative, no distress and mildly obese Neck: no carotid bruit, no JVD and thyroid not enlarged, symmetric, no tenderness/mass/nodules Lungs: clear to auscultation bilaterally Heart: regular rate and rhythm and 2/6 systolic AOV Extremities: no hair growth on toes, trace edema Pulses: diminnished distal pulses Skin: pale, warm, dry Neurologic: Grossly normal   ASSESSMENT AND PLAN:   Essential hypertension Sub optimal control-increase medications as noted  CKD (chronic kidney disease) stage 3, GFR 30-59 ml/min (HCC) GFR 39  S/P CABG x 06-2010 LIMA-LAD, SVG to PDA (Dr. Servando Snare)  History of stroke a. 2014 with mild right hand weakness, nonhemorrhagic per pt. B. repeat stroke in September 2016 - again right hand weakness, left ICA PTA/stent C. Lt brain CVA 05/24/2018  CAD S/P percutaneous coronary angioplasty NSTEMI 05/2014 - s/p DES to SVG-RPDA 06/18/14, EF 60-65%; -> Last Cath 08/2017 - patent stents in SVG-rPDA.   PLAN  Increase Coreg to 6.25 mg BID, increase Cardura to 2 mg Q HS. Check CBC.  Check LEA dopplers  Kerin Ransom PA-C 07/21/2018 5:18 PM

## 2018-07-21 NOTE — Telephone Encounter (Signed)
Called patient to discuss why he did not follow through on having a CBC drawn 2 weeks following his cerebral angiogram with Deveshwar on 06/06/18. He states he just "forgot to do it." I asked if he was still interested in having his stenosis treated with Deveshwar and he states that he is. The patient states that he is going to his cardiologist this afternoon for an appointment. I told him I would call Dr. Ellyn Hack and see if they would draw a CBC while the patient was there today for an office visit. I did call and ask the cardiology office to do this blood draw and they are going to call me back. Once he has his CBC checked and Deveshwar reviews it we will rescheduled his stenosis treatment.  Patient agrees to this plan of care. JM

## 2018-07-22 LAB — CBC
Hematocrit: 31.5 % — ABNORMAL LOW (ref 37.5–51.0)
Hemoglobin: 10.5 g/dL — ABNORMAL LOW (ref 13.0–17.7)
MCH: 29.2 pg (ref 26.6–33.0)
MCHC: 33.3 g/dL (ref 31.5–35.7)
MCV: 88 fL (ref 79–97)
Platelets: 313 10*3/uL (ref 150–450)
RBC: 3.6 x10E6/uL — ABNORMAL LOW (ref 4.14–5.80)
RDW: 16.4 % — ABNORMAL HIGH (ref 11.6–15.4)
WBC: 5.7 10*3/uL (ref 3.4–10.8)

## 2018-07-26 ENCOUNTER — Other Ambulatory Visit (HOSPITAL_COMMUNITY): Payer: Medicare Other

## 2018-07-27 ENCOUNTER — Encounter: Payer: Self-pay | Admitting: Neurology

## 2018-07-27 ENCOUNTER — Ambulatory Visit (INDEPENDENT_AMBULATORY_CARE_PROVIDER_SITE_OTHER): Payer: Medicare Other | Admitting: Neurology

## 2018-07-27 ENCOUNTER — Other Ambulatory Visit: Payer: Self-pay

## 2018-07-27 VITALS — BP 166/77 | HR 50 | Ht 73.0 in | Wt 217.2 lb

## 2018-07-27 DIAGNOSIS — Z9861 Coronary angioplasty status: Secondary | ICD-10-CM | POA: Diagnosis not present

## 2018-07-27 DIAGNOSIS — R29898 Other symptoms and signs involving the musculoskeletal system: Secondary | ICD-10-CM | POA: Diagnosis not present

## 2018-07-27 DIAGNOSIS — I6602 Occlusion and stenosis of left middle cerebral artery: Secondary | ICD-10-CM

## 2018-07-27 DIAGNOSIS — I2510 Atherosclerotic heart disease of native coronary artery without angina pectoris: Secondary | ICD-10-CM

## 2018-07-27 NOTE — Patient Instructions (Signed)
I had a long d/w patient about his recent stroke,left middle cerebral artery stenosis, need for angioplasty/stenting. risk for recurrent stroke/TIAs, personally independently reviewed imaging studies and stroke evaluation results and answered questions.Continue aspirin and Plavix for secondary stroke prevention and maintain strict control of hypertension with blood pressure goal below 130/90, diabetes with hemoglobin A1c goal below 6.5% and lipids with LDL cholesterol goal below 70 mg/dL. I also advised the patient to eat a healthy diet with plenty of whole grains, cereals, fruits and vegetables, exercise regularly and maintain ideal body weight .he was advised to follow-up with Dr. Estanislado Pandy soon for his elective left middle cerebral artery stenting.  I encouraged him to continue to do right hand muscle activity and exercises.  Followup in the future with my nurse practitioner Janett Billow in 6 months or call earlier if necessary.  Stroke Prevention Some medical conditions and behaviors are associated with a higher chance of having a stroke. You can help prevent a stroke by making nutrition, lifestyle, and other changes, including managing any medical conditions you may have. What nutrition changes can be made?   Eat healthy foods. You can do this by: ? Choosing foods high in fiber, such as fresh fruits and vegetables and whole grains. ? Eating at least 5 or more servings of fruits and vegetables a day. Try to fill half of your plate at each meal with fruits and vegetables. ? Choosing lean protein foods, such as lean cuts of meat, poultry without skin, fish, tofu, beans, and nuts. ? Eating low-fat dairy products. ? Avoiding foods that are high in salt (sodium). This can help lower blood pressure. ? Avoiding foods that have saturated fat, trans fat, and cholesterol. This can help prevent high cholesterol. ? Avoiding processed and premade foods.  Follow your health care provider's specific guidelines for  losing weight, controlling high blood pressure (hypertension), lowering high cholesterol, and managing diabetes. These may include: ? Reducing your daily calorie intake. ? Limiting your daily sodium intake to 1,500 milligrams (mg). ? Using only healthy fats for cooking, such as olive oil, canola oil, or sunflower oil. ? Counting your daily carbohydrate intake. What lifestyle changes can be made?  Maintain a healthy weight. Talk to your health care provider about your ideal weight.  Get at least 30 minutes of moderate physical activity at least 5 days a week. Moderate activity includes brisk walking, biking, and swimming.  Do not use any products that contain nicotine or tobacco, such as cigarettes and e-cigarettes. If you need help quitting, ask your health care provider. It may also be helpful to avoid exposure to secondhand smoke.  Limit alcohol intake to no more than 1 drink a day for nonpregnant women and 2 drinks a day for men. One drink equals 12 oz of beer, 5 oz of wine, or 1 oz of hard liquor.  Stop any illegal drug use.  Avoid taking birth control pills. Talk to your health care provider about the risks of taking birth control pills if: ? You are over 2 years old. ? You smoke. ? You get migraines. ? You have ever had a blood clot. What other changes can be made?  Manage your cholesterol levels. ? Eating a healthy diet is important for preventing high cholesterol. If cholesterol cannot be managed through diet alone, you may also need to take medicines. ? Take any prescribed medicines to control your cholesterol as told by your health care provider.  Manage your diabetes. ? Eating a healthy diet and  exercising regularly are important parts of managing your blood sugar. If your blood sugar cannot be managed through diet and exercise, you may need to take medicines. ? Take any prescribed medicines to control your diabetes as told by your health care provider.  Control your  hypertension. ? To reduce your risk of stroke, try to keep your blood pressure below 130/80. ? Eating a healthy diet and exercising regularly are an important part of controlling your blood pressure. If your blood pressure cannot be managed through diet and exercise, you may need to take medicines. ? Take any prescribed medicines to control hypertension as told by your health care provider. ? Ask your health care provider if you should monitor your blood pressure at home. ? Have your blood pressure checked every year, even if your blood pressure is normal. Blood pressure increases with age and some medical conditions.  Get evaluated for sleep disorders (sleep apnea). Talk to your health care provider about getting a sleep evaluation if you snore a lot or have excessive sleepiness.  Take over-the-counter and prescription medicines only as told by your health care provider. Aspirin or blood thinners (antiplatelets or anticoagulants) may be recommended to reduce your risk of forming blood clots that can lead to stroke.  Make sure that any other medical conditions you have, such as atrial fibrillation or atherosclerosis, are managed. What are the warning signs of a stroke? The warning signs of a stroke can be easily remembered as BEFAST.  B is for balance. Signs include: ? Dizziness. ? Loss of balance or coordination. ? Sudden trouble walking.  E is for eyes. Signs include: ? A sudden change in vision. ? Trouble seeing.  F is for face. Signs include: ? Sudden weakness or numbness of the face. ? The face or eyelid drooping to one side.  A is for arms. Signs include: ? Sudden weakness or numbness of the arm, usually on one side of the body.  S is for speech. Signs include: ? Trouble speaking (aphasia). ? Trouble understanding.  T is for time. ? These symptoms may represent a serious problem that is an emergency. Do not wait to see if the symptoms will go away. Get medical help right  away. Call your local emergency services (911 in the [place] Do not drive yourself to the hospital.  Other signs of stroke may include: ? A sudden, severe headache with no known cause. ? Nausea or vomiting. ? Seizure. Where to find more information For more information, visit:  American Stroke Association: www.strokeassociation.org  National Stroke Association: www.stroke.org Summary  You can prevent a stroke by eating healthy, exercising, not smoking, limiting alcohol intake, and managing any medical conditions you may have.  Do not use any products that contain nicotine or tobacco, such as cigarettes and e-cigarettes. If you need help quitting, ask your health care provider. It may also be helpful to avoid exposure to secondhand smoke.  Remember BEFAST for warning signs of stroke. Get help right away if you or a loved one has any of these signs. This information is not intended to replace advice given to you by your health care provider. Make sure you discuss any questions you have with your health care provider. Document Released: 06/11/2004 Document Revised: 06/09/2016 Document Reviewed: 06/09/2016 Elsevier Interactive Patient Education  2019 [employer].

## 2018-07-27 NOTE — Progress Notes (Signed)
[facility] [address] [place]. [place] [address] [phone]       OFFICE FOLLOW-UP NOTE  Mr. [name] Date of Birth:  Oct 20, 1930 Medical Record Number:  [id number]   HPI: [name] is a 85 year old Caucasian male seen today for initial office follow-up visit following hospital admission for stroke in January 2020.  History is obtained from the patient and review of electronic medical records.  I personally reviewed imaging films in PACS.  He is a 85 year old male with past medical history of hypertension, hyperlipidemia, diabetes, coronary artery disease and known left M1 stenosis and prior left hemispheric infarct with mild right-sided hemiparesis who presented to [facility] emergency room on 05/24/2018 with slurred speech and increased right-sided weakness.  His NIH stroke scale was 5 on admission with right facial droop slurred speech and right hand weakness.  He was not given TPA due to unclear last known well.  MRI showed multifocal cortical and subcortical possibly watershed left hemispheric infarcts.  There was a tiny additional right frontal white matter infarct as well.  Carotid ultrasound showed bilateral 1-39% stenosis.  2D echo showed normal ejection fraction.  LDL cholesterol was 81 mg percent and hemoglobin A1c 8.2.  CT angiogram showed severe distal left M1 stenosis.  Patient was seen by Dr. Estanislado Pandy and initially elective left MCA angioplasty and stenting with plan for January 28 however the patient was found to have significant anemia and he was a Sales promotion account executive Witness and refused blood transfusion hence the procedure was postponed.  Patient was meant to follow-up with Dr. Estanislado Pandy but this has not yet happened.  He states he still has weakness in his right hand though his slurred speech and facial droop appear to have improved.  He has finished home physical and occupational therapy and has been doing home exercises in his right hand regularly.  He is  tolerating aspirin and Plavix without bleeding or bruising.  He states his blood pressure fluctuates a lot and usually is on the higher side.  Today it is 166/78.  He remains on Crestor which is tolerating well without any side effects.  His past neurological history significant for TIA in April 2018 with transient dysarthria and right facial droop.  In September 2016 he had left MCA and PCA as well as left ACA/MCA watershed infarcts with high-grade petrous ICA stenosis of 90%.  He underwent angioplasty without stent by Dr. Estanislado Pandy.  He also had prior history of left MCA branch infarct in 2013.  ROS:   14 system review of systems is positive for leg swelling, hearing loss, cough, blood in stool, constipation, anemia, easy bruising, increased thirst, joint pain, numbness and all other systems negative PMH:  Past Medical History:  Diagnosis Date   Abdominal aortic aneurysm ([place])    a. Aortic duplex 06/2014: mild aneurysmal dilatation of proximal abdominal aorta measuring 3.4x3.4cm. No sig change from 2012. 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 NSTEMI 11/2010 - CABG x2(LIMA to LAD, SVG to PDA). b. NEG Lexi MV 10/24/13, EF 53%, no perfusion abnormality, septal and apical HK noted. c. NSTEMI 05/2014 - s/p DES to SVG-RPDA 06/18/14 (Xience Alpine DES 3.0 x 18 mm -3.35 mm), EF 60-65; d.  03/2016 STEMI/PCI: LM nl, ost LAD 70%, mLCx 50%, pRCA 95% - mRCA 60%, dRPDA 70%, LIMA->LAD ok, o-p SVG->RPDA 100% (3.0x16 Promus DES overlapping prior stent).   Diastolic dysfunction    a. 03/2016 Echo: EF 60-65%, no rwma, Gr1 DD, triv AI, Ao root 60mm, Asc Ao 93mm, triv MR.   Dizziness    a. Carotid duplex 03/2014: mild fibrous plaque, no significant stenosis.   Foley catheter in place    "been wearing it  for a couple months now" (03/26/2016)   HTN (hypertension)    Hyperlipidemia LDL goal <70    Medical history non-contributory    Non-STEMI (non-ST elevated myocardial infarction) ([place]) 11/2010; 03/2018   1. Ostial LAD 70% (to close to [facility] for PCI), subtotal occlusion of the RCA; 2.  In-stent restenosis SVG-RCA -PTCA.   Pneumonia    as a baby   Postoperative atrial fibrillation ([place]) 11/2010   Post CABG, no sign recurrence   Refusal of blood transfusions as patient is Jehovah's Witness    S/P CABG x 17 November 2010   LIMA-LAD, SVG to PDA (Dr. Servando Snare)   Sleep apnea    Not on CPAP. (03/26/2016)   ST elevation myocardial infarction (STEMI) of inferior wall ([place]) 04/05/2016   Occluded in-stent restenosis/thrombosis of SVG-RCA --> treated with overlapping Promus DES 3.0 mm 16 mm postdilated 3.0 mm).   Stroke [facility]) 2014; 01/2015   a. 2014 with mild right hand weakness, nonhemorrhagic per pt.;; b. 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   Food insecurity:    Worry: Never true    Inability: Never true   Transportation needs:    Medical: No    Non-medical: No  Tobacco Use   Smoking status: Former Smoker    Packs/day: 0.33    Years: 10.00    Pack years: 3.30    Types: Cigarettes    Last attempt to quit: 1961    Years since quitting: 59.2   Smokeless tobacco: Never Used  Substance and Sexual Activity   Alcohol use: No   Drug use: No   Sexual activity: Never  Lifestyle   Physical activity:    Days per week: 6 days    Minutes per session: 30 min   Stress: Not at all  Relationships   Social connections:    Talks on phone: More than  three times a week    Gets together: More than three times a week    Attends religious service: 1 to 4 times per year    Active member of club or organization: Yes    Attends meetings of clubs or organizations: 1 to 4 times per year    Relationship status: Married   Intimate partner violence:    Fear of current or ex partner: No    Emotionally abused: No    Physically abused: No    Forced sexual activity: No  Other Topics Concern   Not on file  Social History Narrative   Married father of one.  Previously uses stationary bike routinely.  Now reduced due to other social stressors.  Quit smoking 50 years ago.  Does not drink alcohol    Medications:   Current Outpatient Medications on File Prior to Visit  Medication Sig Dispense Refill   acetaminophen (TYLENOL) 325 MG tablet Take 2 tablets (650 mg total) by mouth every 6 (six) hours as needed for mild pain (or Fever >/= 101).     amLODipine (NORVASC) 10 MG tablet Take 1 tablet (10 mg total) by mouth every evening. 30 tablet 0   aspirin 81 MG chewable tablet Chew 1 tablet (81 mg total) by mouth daily.     carvedilol (COREG) 6.25 MG tablet Take 1 tablet (6.25 mg total) by mouth 2 (two) times daily with a meal. 60 tablet 3   clopidogrel (PLAVIX) 75 MG tablet Take 1 tablet (75 mg total) by mouth daily. 90 tablet 3   doxazosin (CARDURA) 2 MG tablet Take 1 tablet (2 mg total) by mouth daily. 30 tablet 3   Ferrous Sulfate Dried (FERROUS SULFATE IRON) 200 (65 Fe) MG TABS Take 1 tablet by mouth 2 (two) times daily after a meal.     furosemide (LASIX) 20 MG tablet Take 1 tablet (20 mg total) by mouth daily. 30 tablet 0   isosorbide mononitrate (IMDUR) 60 MG 24 hr tablet Take 1 tablet (60 mg total) by mouth 2 (two) times daily. 60 tablet 0   metFORMIN (GLUCOPHAGE) 1000 MG tablet Take 1,000 mg by mouth 2 (two) times daily.     nitroGLYCERIN (NITROSTAT) 0.4 MG SL tablet Place 1 tablet (0.4 mg total) under the tongue every 5 (five)  minutes as needed for chest pain (up to 3 doses). (Patient taking differently: Place 0.4 mg under the tongue every 5 (five) minutes x 3 doses as needed for chest pain. ) 25 tablet 3   oxybutynin (DITROPAN) 5 MG tablet Take 5 mg by mouth daily as needed for bladder spasms.      pantoprazole (PROTONIX) 40 MG tablet Take 40 mg by mouth daily.     rosuvastatin (CRESTOR) 20 MG tablet Take 1 tablet (20 mg total) by mouth daily. 30 tablet 1   No current facility-administered medications on file prior to visit.     Allergies:   Allergies  Allergen Reactions   Other Other (See Comments)    No  BLOOD PRODUCTS - Pt is Jehovah's Witness   Statins Other (See Comments)    Cause muscle aches   Atorvastatin Other (See Comments)    Myalgias   Crestor [Rosuvastatin] Other (See Comments)    Myalgias    Pravastatin Other (See Comments)    Myalgias   Simvastatin Other (See Comments)    Myalgias     Physical Exam General: well developed, well nourished elderly Caucasian male, seated, in no evident distress Head: head normocephalic and atraumatic.  Neck: supple with no carotid or supraclavicular bruits Cardiovascular: regular rate and rhythm, no murmurs Musculoskeletal: no deformity Skin:  no rash/petichiae.  Nevus on the inner aspect of right earlobe Vascular:  Normal pulses all extremities Vitals:   07/27/18 1256  BP: (!) 166/77  Pulse: (!) 50   Neurologic Exam Mental Status: Awake and fully alert. Oriented to place and time. Recent and remote memory intact. Attention span, concentration and fund of knowledge appropriate. Mood and affect appropriate.  Cranial Nerves: Fundoscopic exam reveals sharp disc margins. Pupils equal, briskly reactive to light. Extraocular movements full without nystagmus. Visual fields full to confrontation. Hearing only diminished bilaterally. Facial sensation intact. Face, tongue, palate moves normally and symmetrically.  Motor: Normal bulk and tone. Normal  strength in all tested extremity muscles except moderate weakness of right grip and intrinsic hand muscles and wrist.  Diminished fine finger movements on the right.  Orbits left over right upper extremity.. Sensory.: intact to touch ,pinprick .position and vibratory sensation.  Coordination: Rapid alternating movements normal in all extremities. Finger-to-nose and heel-to-shin performed accurately bilaterally. Gait and Station: Arises from chair without difficulty. Stance is normal. Gait demonstrates normal stride length and balance . Able to heel, toe and tandem walk without difficulty.  Reflexes: 1+ and symmetric. Toes downgoing.   NIHSS  0 Modified Rankin  2   ASSESSMENT: 85 year old Caucasian male with left middle cerebral artery infarct secondary to symptomatic high-grade left middle cerebral artery stenosis in January 2020.  Vascular risk factors of hypertension, diabetes, hyperlipidemia and intracranial stenosis.     PLAN: I had a long d/w patient about his recent stroke,left middle cerebral artery stenosis, need for angioplasty/stenting. risk for recurrent stroke/TIAs, personally independently reviewed imaging studies and stroke evaluation results and answered questions.Continue aspirin 81 mg daily and clopidogrel 75 mg daily  for secondary stroke prevention and maintain strict control of hypertension with blood pressure goal below 130/90, diabetes with hemoglobin A1c goal below 6.5% and lipids with LDL cholesterol goal below 70 mg/dL. I also advised the patient to eat a healthy diet with plenty of whole grains, cereals, fruits and vegetables, exercise regularly and maintain ideal body weight .he was advised to follow-up with Dr. Estanislado Pandy soon for his elective left middle cerebral artery stenting.  I encouraged him to continue to do right hand muscle activity and exercises.  Followup in the future with my nurse practitioner [name] in 6 months or call earlier if necessary. Greater than 50%  of time during this 35 minute visit was spent on counseling,explanation of diagnosis of stroke and middle cerebral artery stenosis, planning of further management, discussion with patient and family and coordination of care Antony Contras, MD  [facility] [address] [place],  [address]  Phone [phone] Fax [phone] Note: This document was prepared with digital dictation and possible smart phrase technology. Any transcriptional errors that result from this process are unintentional

## 2018-08-02 ENCOUNTER — Encounter: Payer: Self-pay | Admitting: Gastroenterology

## 2018-08-04 ENCOUNTER — Telehealth: Payer: Self-pay | Admitting: *Deleted

## 2018-08-04 NOTE — Telephone Encounter (Signed)
   Primary Cardiologist:  Glenetta Hew, MD   Patient contacted.  History reviewed.  No symptoms to suggest any unstable cardiac conditions.  Based on discussion, with current pandemic situation, we will be postponing this appointment for [name].  If symptoms change, he has been instructed to contact our office.   Routing to C19 CANCEL pool for tracking (P CV DIV CV19 CANCEL) and assigning priority (1 = 4-6 wks, 2 = 6-12 wks, 3 = >12 wks).  [name] Simmonds, RN  08/04/2018 4:33 PM         .

## 2018-08-08 ENCOUNTER — Other Ambulatory Visit: Payer: Self-pay | Admitting: Physician Assistant

## 2018-08-10 ENCOUNTER — Ambulatory Visit: Payer: Medicare Other | Admitting: Cardiology

## 2018-08-19 ENCOUNTER — Inpatient Hospital Stay (HOSPITAL_COMMUNITY): Admission: RE | Admit: 2018-08-19 | Payer: Medicare Other | Source: Ambulatory Visit

## 2018-08-19 ENCOUNTER — Encounter (HOSPITAL_COMMUNITY): Payer: Medicare Other

## 2018-08-29 ENCOUNTER — Ambulatory Visit: Payer: Medicare Other | Admitting: Cardiology

## 2018-09-10 ENCOUNTER — Other Ambulatory Visit: Payer: Self-pay | Admitting: Cardiology

## 2018-09-12 NOTE — Telephone Encounter (Signed)
Doxazosin 2 mg refilled.

## 2018-09-16 ENCOUNTER — Encounter (HOSPITAL_COMMUNITY): Payer: Medicare Other

## 2018-09-19 ENCOUNTER — Other Ambulatory Visit (HOSPITAL_COMMUNITY): Payer: Self-pay | Admitting: Physician Assistant

## 2018-09-19 DIAGNOSIS — I714 Abdominal aortic aneurysm, without rupture, unspecified: Secondary | ICD-10-CM

## 2018-10-12 ENCOUNTER — Other Ambulatory Visit (HOSPITAL_COMMUNITY): Payer: Self-pay | Admitting: Interventional Radiology

## 2018-10-12 DIAGNOSIS — I771 Stricture of artery: Secondary | ICD-10-CM

## 2018-10-17 ENCOUNTER — Other Ambulatory Visit: Payer: Self-pay

## 2018-10-17 ENCOUNTER — Ambulatory Visit (HOSPITAL_COMMUNITY)
Admission: RE | Admit: 2018-10-17 | Discharge: 2018-10-17 | Disposition: A | Discharge status: Home / Self Care | Payer: Medicare Other | Source: Ambulatory Visit | Attending: Cardiovascular Disease | Admitting: Cardiovascular Disease

## 2018-10-17 DIAGNOSIS — I739 Peripheral vascular disease, unspecified: Secondary | ICD-10-CM | POA: Insufficient documentation

## 2018-10-17 DIAGNOSIS — I10 Essential (primary) hypertension: Secondary | ICD-10-CM | POA: Diagnosis not present

## 2018-10-19 ENCOUNTER — Telehealth: Payer: Self-pay | Admitting: Cardiology

## 2018-10-19 NOTE — Telephone Encounter (Signed)
I called Mr Hirano about his LEA study.  I will ask Dr Gwenlyn Found to see him in the office for further evaluation.  He was having rt leg symptoms c/w claudication according to my last office note.   Kerin Ransom PA-C 10/19/2018 9:22 AM

## 2018-10-21 ENCOUNTER — Other Ambulatory Visit (HOSPITAL_COMMUNITY)
Admission: RE | Admit: 2018-10-21 | Discharge: 2018-10-21 | Disposition: A | Discharge status: Home / Self Care | Payer: Medicare Other | Source: Ambulatory Visit | Attending: Interventional Radiology | Admitting: Interventional Radiology

## 2018-10-21 ENCOUNTER — Other Ambulatory Visit: Payer: Self-pay

## 2018-10-21 DIAGNOSIS — Z01812 Encounter for preprocedural laboratory examination: Secondary | ICD-10-CM | POA: Insufficient documentation

## 2018-10-21 DIAGNOSIS — Z1159 Encounter for screening for other viral diseases: Secondary | ICD-10-CM | POA: Diagnosis not present

## 2018-10-22 LAB — NOVEL CORONAVIRUS, NAA (HOSP ORDER, SEND-OUT TO REF LAB; TAT 18-24 HRS): SARS-CoV-2, NAA: NOT DETECTED

## 2018-10-25 ENCOUNTER — Other Ambulatory Visit: Payer: Self-pay

## 2018-10-25 ENCOUNTER — Encounter (HOSPITAL_COMMUNITY): Payer: Self-pay | Admitting: *Deleted

## 2018-10-25 ENCOUNTER — Other Ambulatory Visit: Payer: Self-pay | Admitting: Student

## 2018-10-25 NOTE — Progress Notes (Signed)
Spoke with pt for pre-op call. Pt has extensive cardiac history with MI, stents and CABG. Dr. Ellyn Hack is his cardiologist, last visit was 07/21/18. Pt is a diabetic. Last A1C was 8.2 on 04/17/18. Pt states his fasting blood sugar is usually around 130. Instructed pt not to take his oral diabetic medications in the AM. Instructed him to check his blood sugar when he gets up in the AM.  If blood sugar is 70 or below, treat with 1/2 cup of clear juice (apple or cranberry) and recheck blood sugar 15 minutes after drinking juice. Pt voiced understanding.  Pt had Covid 19 test done on 10/21/18. It was negative. He states he has self quarantined, went out to pharmacy one time and wore a mask.   Coronavirus Screening  Have you experienced the following symptoms:  Cough NO Fever (>100.16F) NO Runny nose NO Sore throat NO Difficulty breathing/shortness of breath  NO  Have you or a family member traveled in the last 14 days and where?    If the patient indicates "YES" to the above questions, their PAT will be rescheduled to limit the exposure to others and, the surgeon will be notified. THE PATIENT WILL NEED TO BE ASYMPTOMATIC FOR 14 DAYS.   If the patient is not experiencing any of these symptoms, the PAT nurse will instruct them to NOT bring anyone with them to their appointment since they may have these symptoms or traveled as well.   Patient reminded that hospital visitation restrictions are in effect and the importance of the restrictions.

## 2018-10-25 NOTE — Anesthesia Preprocedure Evaluation (Addendum)
Anesthesia Evaluation  Patient identified by MRN, date of birth, ID band Patient awake    Reviewed: Allergy & Precautions, H&P , NPO status , Patient's Chart, lab work & pertinent test results, reviewed documented beta blocker date and time   Airway Mallampati: III  TM Distance: >3 FB Neck ROM: Full    Dental no notable dental hx. (+) Teeth Intact, Dental Advisory Given   Pulmonary sleep apnea , former smoker,    Pulmonary exam normal breath sounds clear to auscultation       Cardiovascular Exercise Tolerance: Good hypertension, Pt. on medications and Pt. on home beta blockers + CAD, + Past MI, + Cardiac Stents, + CABG, + Peripheral Vascular Disease and +CHF   Rhythm:Regular Rate:Normal     Neuro/Psych CVA, Residual Symptoms negative psych ROS   GI/Hepatic negative GI ROS, Neg liver ROS,   Endo/Other  diabetes, Type 2, Oral Hypoglycemic Agents  Renal/GU Renal InsufficiencyRenal disease  negative genitourinary   Musculoskeletal  (+) Arthritis , Osteoarthritis,    Abdominal   Peds  Hematology  (+) Blood dyscrasia, anemia ,   Anesthesia Other Findings   Reproductive/Obstetrics negative OB ROS                            Anesthesia Physical Anesthesia Plan  ASA: III  Anesthesia Plan: General   Post-op Pain Management:    Induction: Intravenous  PONV Risk Score and Plan: 3 and Ondansetron, Treatment may vary due to age or medical condition and Dexamethasone  Airway Management Planned: Oral ETT  Additional Equipment: Arterial line  Intra-op Plan:   Post-operative Plan: Extubation in OR  Informed Consent: I have reviewed the patients History and Physical, chart, labs and discussed the procedure including the risks, benefits and alternatives for the proposed anesthesia with the patient or authorized representative who has indicated his/her understanding and acceptance.     Dental  advisory given  Plan Discussed with: CRNA  Anesthesia Plan Comments:         Anesthesia Quick Evaluation

## 2018-10-26 ENCOUNTER — Encounter (HOSPITAL_COMMUNITY): Payer: Self-pay

## 2018-10-26 ENCOUNTER — Other Ambulatory Visit: Payer: Self-pay

## 2018-10-26 ENCOUNTER — Ambulatory Visit (HOSPITAL_COMMUNITY)
Admission: RE | Admit: 2018-10-26 | Discharge: 2018-10-26 | Disposition: A | Discharge status: Home / Self Care | Payer: Medicare Other | Source: Ambulatory Visit | Attending: Interventional Radiology | Admitting: Interventional Radiology

## 2018-10-26 ENCOUNTER — Ambulatory Visit (HOSPITAL_COMMUNITY)
Admission: RE | Admit: 2018-10-26 | Discharge: 2018-10-26 | Disposition: A | Discharge status: Home / Self Care | Payer: Medicare Other | Source: Home / Self Care | Attending: Interventional Radiology | Admitting: Interventional Radiology

## 2018-10-26 ENCOUNTER — Encounter (HOSPITAL_COMMUNITY)
Admission: RE | Disposition: A | Discharge status: Home / Self Care | Payer: Self-pay | Source: Home / Self Care | Attending: Interventional Radiology

## 2018-10-26 ENCOUNTER — Ambulatory Visit (HOSPITAL_COMMUNITY): Payer: Medicare Other | Admitting: Anesthesiology

## 2018-10-26 DIAGNOSIS — I252 Old myocardial infarction: Secondary | ICD-10-CM | POA: Insufficient documentation

## 2018-10-26 DIAGNOSIS — D649 Anemia, unspecified: Secondary | ICD-10-CM | POA: Diagnosis not present

## 2018-10-26 DIAGNOSIS — I771 Stricture of artery: Secondary | ICD-10-CM

## 2018-10-26 DIAGNOSIS — Z7982 Long term (current) use of aspirin: Secondary | ICD-10-CM | POA: Diagnosis not present

## 2018-10-26 DIAGNOSIS — E785 Hyperlipidemia, unspecified: Secondary | ICD-10-CM | POA: Insufficient documentation

## 2018-10-26 DIAGNOSIS — I6602 Occlusion and stenosis of left middle cerebral artery: Secondary | ICD-10-CM | POA: Diagnosis present

## 2018-10-26 DIAGNOSIS — E119 Type 2 diabetes mellitus without complications: Secondary | ICD-10-CM | POA: Diagnosis not present

## 2018-10-26 DIAGNOSIS — Z955 Presence of coronary angioplasty implant and graft: Secondary | ICD-10-CM | POA: Insufficient documentation

## 2018-10-26 DIAGNOSIS — I714 Abdominal aortic aneurysm, without rupture: Secondary | ICD-10-CM | POA: Insufficient documentation

## 2018-10-26 DIAGNOSIS — Z87891 Personal history of nicotine dependence: Secondary | ICD-10-CM | POA: Insufficient documentation

## 2018-10-26 DIAGNOSIS — Z7984 Long term (current) use of oral hypoglycemic drugs: Secondary | ICD-10-CM | POA: Insufficient documentation

## 2018-10-26 DIAGNOSIS — G4730 Sleep apnea, unspecified: Secondary | ICD-10-CM | POA: Insufficient documentation

## 2018-10-26 DIAGNOSIS — Z8673 Personal history of transient ischemic attack (TIA), and cerebral infarction without residual deficits: Secondary | ICD-10-CM | POA: Insufficient documentation

## 2018-10-26 DIAGNOSIS — Z79899 Other long term (current) drug therapy: Secondary | ICD-10-CM | POA: Diagnosis not present

## 2018-10-26 DIAGNOSIS — I10 Essential (primary) hypertension: Secondary | ICD-10-CM | POA: Insufficient documentation

## 2018-10-26 DIAGNOSIS — I2510 Atherosclerotic heart disease of native coronary artery without angina pectoris: Secondary | ICD-10-CM | POA: Diagnosis not present

## 2018-10-26 HISTORY — PX: IR ANGIO INTRA EXTRACRAN SEL COM CAROTID INNOMINATE UNI L MOD SED: IMG5358

## 2018-10-26 HISTORY — PX: IR 3D INDEPENDENT WKST: IMG2385

## 2018-10-26 HISTORY — PX: RADIOLOGY WITH ANESTHESIA: SHX6223

## 2018-10-26 LAB — BASIC METABOLIC PANEL
Anion gap: 11 (ref 5–15)
BUN: 25 mg/dL — ABNORMAL HIGH (ref 8–23)
CO2: 25 mmol/L (ref 22–32)
Calcium: 9 mg/dL (ref 8.9–10.3)
Chloride: 104 mmol/L (ref 98–111)
Creatinine, Ser: 1.62 mg/dL — ABNORMAL HIGH (ref 0.61–1.24)
GFR calc Af Amer: 44 mL/min — ABNORMAL LOW (ref >60)
GFR calc non Af Amer: 38 mL/min — ABNORMAL LOW (ref >60)
Glucose, Bld: 130 mg/dL — ABNORMAL HIGH (ref 70–99)
Potassium: 4.2 mmol/L (ref 3.5–5.1)
Sodium: 140 mmol/L (ref 135–145)

## 2018-10-26 LAB — CBC WITH DIFFERENTIAL/PLATELET
Abs Immature Granulocytes: 0.24 10*3/uL — ABNORMAL HIGH (ref 0.00–0.07)
Basophils Absolute: 0 10*3/uL (ref 0.0–0.1)
Basophils Relative: 1 %
Eosinophils Absolute: 0.1 10*3/uL (ref 0.0–0.5)
Eosinophils Relative: 3 %
HCT: 32.9 % — ABNORMAL LOW (ref 39.0–52.0)
Hemoglobin: 10.5 g/dL — ABNORMAL LOW (ref 13.0–17.0)
Immature Granulocytes: 4 %
Lymphocytes Relative: 29 %
Lymphs Abs: 1.6 10*3/uL (ref 0.7–4.0)
MCH: 29.8 pg (ref 26.0–34.0)
MCHC: 31.9 g/dL (ref 30.0–36.0)
MCV: 93.5 fL (ref 80.0–100.0)
Monocytes Absolute: 1 10*3/uL (ref 0.1–1.0)
Monocytes Relative: 18 %
Neutro Abs: 2.6 10*3/uL (ref 1.7–7.7)
Neutrophils Relative %: 45 %
Platelets: 285 10*3/uL (ref 150–400)
RBC: 3.52 MIL/uL — ABNORMAL LOW (ref 4.22–5.81)
RDW: 15.9 % — ABNORMAL HIGH (ref 11.5–15.5)
WBC: 5.7 10*3/uL (ref 4.0–10.5)
nRBC: 0 % (ref 0.0–0.2)

## 2018-10-26 LAB — GLUCOSE, CAPILLARY
Glucose-Capillary: 125 mg/dL — ABNORMAL HIGH (ref 70–99)
Glucose-Capillary: 116 mg/dL — ABNORMAL HIGH (ref 70–99)

## 2018-10-26 LAB — PROTIME-INR
INR: 1.1 (ref 0.8–1.2)
Prothrombin Time: 14.1 seconds (ref 11.4–15.2)

## 2018-10-26 LAB — APTT: aPTT: 31 seconds (ref 24–36)

## 2018-10-26 LAB — PLATELET INHIBITION P2Y12: Platelet Function  P2Y12: 139 [PRU] — ABNORMAL LOW (ref 182–335)

## 2018-10-26 SURGERY — RADIOLOGY WITH ANESTHESIA
Anesthesia: Monitor Anesthesia Care

## 2018-10-26 MED ORDER — CLOPIDOGREL BISULFATE 75 MG PO TABS
ORAL_TABLET | ORAL | Status: AC
  Filled 2018-10-26: qty 1

## 2018-10-26 MED ORDER — HEPARIN SODIUM (PORCINE) 1000 UNIT/ML IJ SOLN
INTRAMUSCULAR | Status: DC | PRN
  Administered 2018-10-26 (×2): 1000 [IU] via INTRAVENOUS

## 2018-10-26 MED ORDER — IOHEXOL 300 MG/ML  SOLN: 78.0000 mL | Freq: Once | INTRAMUSCULAR | Status: DC | PRN

## 2018-10-26 MED ORDER — ACETAMINOPHEN 500 MG PO TABS: 1000.0000 mg | ORAL_TABLET | Freq: Once | ORAL | Status: DC

## 2018-10-26 MED ORDER — ASPIRIN EC 325 MG PO TBEC: 325.0000 mg | DELAYED_RELEASE_TABLET | ORAL | Status: DC

## 2018-10-26 MED ORDER — NITROGLYCERIN 1 MG/10 ML FOR IR/CATH LAB
INTRA_ARTERIAL | Status: AC
  Filled 2018-10-26: qty 10

## 2018-10-26 MED ORDER — HYDRALAZINE HCL 20 MG/ML IJ SOLN
INTRAMUSCULAR | Status: DC | PRN
  Administered 2018-10-26: 5 mg via INTRAVENOUS

## 2018-10-26 MED ORDER — NIMODIPINE 30 MG PO CAPS: 0.0000 mg | ORAL_CAPSULE | ORAL | Status: DC

## 2018-10-26 MED ORDER — SODIUM CHLORIDE 0.9 % IV SOLN
INTRAVENOUS | Status: AC
  Administered 2018-10-26: 11:00:00 via INTRAVENOUS

## 2018-10-26 MED ORDER — LIDOCAINE HCL 1 % IJ SOLN
INTRAMUSCULAR | Status: AC
  Filled 2018-10-26: qty 20

## 2018-10-26 MED ORDER — LIDOCAINE HCL (PF) 1 % IJ SOLN
INTRAMUSCULAR | Status: AC | PRN
  Administered 2018-10-26: 10 mL

## 2018-10-26 MED ORDER — ASPIRIN EC 325 MG PO TBEC
DELAYED_RELEASE_TABLET | ORAL | Status: AC
  Filled 2018-10-26: qty 1

## 2018-10-26 MED ORDER — LACTATED RINGERS IV SOLN
INTRAVENOUS | Status: DC | PRN
  Administered 2018-10-26: 09:00:00 via INTRAVENOUS

## 2018-10-26 MED ORDER — CLOPIDOGREL BISULFATE 75 MG PO TABS: 75.0000 mg | ORAL_TABLET | ORAL | Status: DC

## 2018-10-26 MED ORDER — SODIUM CHLORIDE 0.9 % IV SOLN: INTRAVENOUS | Status: DC

## 2018-10-26 MED ORDER — FENTANYL CITRATE (PF) 100 MCG/2ML IJ SOLN
INTRAMUSCULAR | Status: AC
  Filled 2018-10-26: qty 2

## 2018-10-26 MED ORDER — CEFAZOLIN SODIUM-DEXTROSE 2-4 GM/100ML-% IV SOLN
INTRAVENOUS | Status: AC
  Filled 2018-10-26: qty 100

## 2018-10-26 NOTE — Discharge Instructions (Signed)
Femoral Site Care °This sheet gives you information about how to care for yourself after your procedure. Your health care provider may also give you more specific instructions. If you have problems or questions, contact your health care provider. °What can I expect after the procedure? °After the procedure, it is common to have: °· Bruising that usually fades within 1-2 weeks. °· Tenderness at the site. °Follow these instructions at home: °Wound care °· Follow instructions from your health care provider about how to take care of your insertion site. Make sure you: °? Wash your hands with soap and water before you change your bandage (dressing). If soap and water are not available, use hand sanitizer. °? Change your dressing as told by your health care provider. °? Leave stitches (sutures), skin glue, or adhesive strips in place. These skin closures may need to stay in place for 2 weeks or longer. If adhesive strip edges start to loosen and curl up, you may trim the loose edges. Do not remove adhesive strips completely unless your health care provider tells you to do that. °· Do not take baths, swim, or use a hot tub until your health care provider approves. °· You may shower 24-48 hours after the procedure or as told by your health care provider. °? Gently wash the site with plain soap and water. °? Pat the area dry with a clean towel. °? Do not rub the site. This may cause bleeding. °· Do not apply powder or lotion to the site. Keep the site clean and dry. °· Check your femoral site every day for signs of infection. Check for: °? Redness, swelling, or pain. °? Fluid or blood. °? Warmth. °? Pus or a bad smell. °Activity °· For the first 2-3 days after your procedure, or as long as directed: °? Avoid climbing stairs as much as possible. °? Do not squat. °· Do not lift anything that is heavier than 10 lb (4.5 kg), or the limit that you are told, until your health care provider says that it is safe. °· Rest as  directed. °? Avoid sitting for a long time without moving. Get up to take short walks every 1-2 hours. °· Do not drive for 24 hours if you were given a medicine to help you relax (sedative). °General instructions °· Take over-the-counter and prescription medicines only as told by your health care provider. °· Keep all follow-up visits as told by your health care provider. This is important. °Contact a health care provider if you have: °· A fever or chills. °· You have redness, swelling, or pain around your insertion site. °Get help right away if: °· The catheter insertion area swells very fast. °· You pass out. °· You suddenly start to sweat or your skin gets clammy. °· The catheter insertion area is bleeding, and the bleeding does not stop when you hold steady pressure on the area. °· The area near or just beyond the catheter insertion site becomes pale, cool, tingly, or numb. °These symptoms may represent a serious problem that is an emergency. Do not wait to see if the symptoms will go away. Get medical help right away. Call your local emergency services (911 in the [place] Do not drive yourself to the hospital. °Summary °· After the procedure, it is common to have bruising that usually fades within 1-2 weeks. °· Check your femoral site every day for signs of infection. °· Do not lift anything that is heavier than 10 lb (4.5 kg), or the   limit that you are told, until your health care provider says that it is safe. °This information is not intended to replace advice given to you by your health care provider. Make sure you discuss any questions you have with your health care provider. °Document Released: 01/05/2014 Document Revised: 05/17/2017 Document Reviewed: 05/17/2017 °Elsevier Interactive Patient Education © 2019 [employer] ° °

## 2018-10-26 NOTE — Procedures (Signed)
S/P Lt common carotisd arteriogram RT CFA approach. Findings. 1.Severe Lt MCA M1 stenosis 90 % stable

## 2018-10-26 NOTE — Anesthesia Procedure Notes (Addendum)
Arterial Line Insertion Start/End6/02/2019 8:11 AM, 10/26/2018 8:24 AM Performed by: Eligha Bridegroom, CRNA  Lidocaine 1% used for infiltration Left, radial was placed Catheter size: 22 G Hand hygiene performed  and maximum sterile barriers used   Attempts: 1 Procedure performed without using ultrasound guided technique. Following insertion, dressing applied and Biopatch. Post procedure assessment: normal  Patient tolerated the procedure well with no immediate complications.

## 2018-10-26 NOTE — H&P (Deleted)
  The note originally documented on this encounter has been moved the the encounter in which it belongs.  

## 2018-10-26 NOTE — H&P (Signed)
Chief Complaint: Patient was seen in consultation today for cerebral arteriogram with possible cerebral artery stenosis angioplasty/stent placement   Referring Physician(s): Dr Chauncey Cruel Aroor  Supervising Physician: Luanne Bras  Patient Status: [facility] - Out-pt  History of Present Illness: [name] is a 85 y.o. male   Known to NIR L ICA angioplasty/stent 2016 Previous CVA Follows with Dr Estanislado Pandy  January 2020 Cerebral arteriogram:    Recent onset of right upper extremity weakness and extension of a left parietal cerebral hemispheric infarct. Presence of a high-grade stenosis of the left middle cerebral artery. IMPRESSION: Angiographically severe high-grade stenosis of the left anterior cerebral artery A1 segment in the mid M1 region. Approximately 50% stenosis of the left anterior cerebral artery proximally. Occluded left anterior cerebral artery at the A1 A2 junction with significant collateralization from frontoparietal branch of the middle cerebral artery. PLAN: In view of the patient's low hemoglobin it was elected to hold off on the revascularization of the left middle cerebral artery with angioplasty and/or stenting. The patient is a Restaurant manager, fast food. He has been asked to continue taking his medications and eat proper meals in order to continue to improve his hemoglobin. Hemoglobin will be drawn in about 2 weeks. Probable planning for revascularization once the patient's hemoglobin is in the region of approximately 9-10.  Pt denies symptoms of dizziness; vision or speech change Denies N/V; denies numbness or tingling Gait is steady Taking ASA/Plavix daily  Scheduled today for cerebral arteriogram with possible Left cerebral artery angioplasty/stent placement  Past Medical History:  Diagnosis Date   Abdominal aortic aneurysm ([place])    a. Aortic duplex 06/2014: mild aneurysmal dilatation of proximal abdominal aorta measuring 3.4x3.4cm. No sig change  from 2012. 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 NSTEMI 11/2010 - CABG x2(LIMA to LAD, SVG to PDA). b. NEG Lexi MV 10/24/13, EF 53%, no perfusion abnormality, septal and apical HK noted. c. NSTEMI 05/2014 - s/p DES to SVG-RPDA 06/18/14 (Xience Alpine DES 3.0 x 18 mm -3.35 mm), EF 60-65; d. 03/2016 STEMI/PCI: LM nl, ost LAD 70%, mLCx 50%, pRCA 95% - mRCA 60%, dRPDA 70%, LIMA->LAD ok, o-p SVG->RPDA 100% (3.0x16 Promus DES overlapping prior stent).   Diastolic dysfunction    a. 03/2016 Echo: EF 60-65%, no rwma, Gr1 DD, triv AI, Ao root 78mm, Asc Ao 63mm, triv MR.   Dizziness    a. Carotid duplex 03/2014: mild fibrous plaque, no significant stenosis.   Foley catheter in place    "been wearing it for a couple months now" (03/26/2016)   HTN (hypertension)    Hyperlipidemia LDL goal <70    Medical history non-contributory    Non-STEMI (non-ST elevated myocardial infarction) ([place]) 11/2010; 03/2018   1. Ostial LAD 70% (to close to [facility] for PCI), subtotal occlusion of the RCA; 2.  In-stent restenosis SVG-RCA -PTCA.   Pneumonia    as a baby   Postoperative atrial fibrillation ([place]) 11/2010   Post CABG, no sign recurrence   Refusal of blood transfusions as patient is Jehovah's Witness    S/P CABG x 17 November 2010   LIMA-LAD, SVG to PDA (Dr. Servando Snare)   Sleep apnea    Not on CPAP. (03/26/2016)  ST elevation myocardial infarction (STEMI) of inferior wall ([place]) 04/05/2016   Occluded in-stent restenosis/thrombosis of SVG-RCA --> treated with overlapping Promus DES 3.0 mm 16 mm postdilated 3.0 mm).   Stroke [facility]) 2014; 01/2015   a. 2014 with mild right hand weakness, nonhemorrhagic per pt.;; b. - PTA-Stent L ICA 95%    Type II diabetes mellitus [facility])     Past Surgical History:    Procedure Laterality Date   APPENDECTOMY     CARDIAC CATHETERIZATION  12/11/2010   Dr. Chase Picket - subsequent cath - normal LV systolic function, no renal artery stenosis, severe 2-vessel disease with subtotaled RCA prox and distal 60% lesion and complex 70% area of narrowing of ostium of LAD   CARDIAC CATHETERIZATION N/A 03/26/2016   Procedure: Left Heart Cath and Coronary Angiography;  Surgeon: Nelva Bush, MD;  Location: [place] CV LAB;  Service: Cardiovascular;  Laterality: N/A;   CARDIAC CATHETERIZATION N/A 03/26/2016   Procedure: Coronary Stent Intervention;  Surgeon: Nelva Bush, MD;  Location: [place] CV [facility];  Service: Cardiovascular: 100% In-stent thrombosis of pros SVG-RCA (Xience DES) --> treated with PromusDES 3.0 x 18 (3.3 mm)   CARDIAC CATHETERIZATION N/A 03/26/2016   Procedure: Bypass Graft Angiography;  Surgeon: Nelva Bush, MD;  Location: [place] [facility];  Service: Cardiovascular;  Laterality: N/A;   CORONARY ANGIOPLASTY WITH STENT PLACEMENT  06/18/2014   PCI to SVG-RPDA 06/18/14 (Xience Alpine DES 3.0 x 18 mm -3.35 mm),    CORONARY ARTERY BYPASS GRAFT  12/15/2010   X2, Dr Servando Snare; LIMA to LAD, SVG to PDA;    CORONARY BALLOON ANGIOPLASTY N/A 04/18/2018   Procedure: CORONARY BALLOON ANGIOPLASTY;  Surgeon: Leonie Man, MD;  Location: [place] [facility];  Service: Cardiovascular;;; high pressure scoring and noncompliant balloon PTCA of SVG-RCA ISR ostial and proximal   CORONARY/GRAFT ANGIOGRAPHY N/A 08/27/2017   Procedure: CORONARY/GRAFT ANGIOGRAPHY;  Surgeon: Nelva Bush, MD;  Location: [place] [facility];  Service: Cardiovascular;; pLAD 70%, ostD1 50%.  mCx 60%, OM2 80%. pRCA 95% & mRCA 100% - rPDA 70%. LIMA-mLAD patent. SVG-rPDA 10% ISR.    CYSTOSCOPY WITH URETHRAL DILATATION     IR ANGIO INTRA EXTRACRAN SEL COM CAROTID INNOMINATE BILAT MOD SED  02/25/2017   IR ANGIO INTRA EXTRACRAN SEL COM CAROTID INNOMINATE BILAT MOD SED  10/19/2017   Dr.  Kathee Delton: L Common Carotid - ECA & major branches widely patent. ICA ~20% distal to bulb & 50% in supraclinoid segment. LMCA-distal 1/3 MI ~905 stenosis with post-stenotic dilation into inferior division. ~50% prox Basilar A stenosis @ anterior Inf Cerebellar A. 50% R ICA   IR ANGIO INTRA EXTRACRAN SEL COM CAROTID INNOMINATE UNI L MOD SED  06/06/2018   IR ANGIO VERTEBRAL SEL SUBCLAVIAN INNOMINATE UNI L MOD SED  02/25/2017   IR ANGIO VERTEBRAL SEL SUBCLAVIAN INNOMINATE UNI R MOD SED  10/19/2017   IR ANGIO VERTEBRAL SEL VERTEBRAL UNI L MOD SED  10/19/2017   IR GENERIC HISTORICAL  01/21/2016   IR RADIOLOGIST EVAL & MGMT 01/21/2016 [facility]-INTERV RAD   IR GENERIC HISTORICAL  02/03/2016   IR CATHETER TUBE CHANGE 02/03/2016 Marybelle Killings, MD [facility]-INTERV RAD   IR RADIOLOGIST EVAL & MGMT  11/09/2017   LEFT HEART CATH AND CORONARY ANGIOGRAPHY N/A 04/18/2018   Procedure: LEFT HEART CATH AND CORONARY ANGIOGRAPHY;  Surgeon: Leonie Man, MD;  Location: [place] [facility];  Service: Cardiovascular;  Laterality: N/A; stable findings on last cath with exception of 75% in-stent restenosis of  SVG-RCA ostial stent treated with PTCA.     LEFT HEART CATHETERIZATION WITH CORONARY ANGIOGRAM N/A 06/18/2014   Procedure: LEFT HEART CATHETERIZATION WITH CORONARY ANGIOGRAM;  Surgeon: Leonie Man, MD;  Location: [facility] CATH LAB;  Service: Cardiovascular;  -- severe disease of SVG-rPDA   NO PAST SURGERIES     RADIOLOGY WITH ANESTHESIA N/A 01/24/2015   Procedure: STENT ASSISTED ANGIOPLASTY (RADIOLOGY WITH ANESTHESIA);  Surgeon: Luanne Bras, MD;  Location: [place];  Service: Radiology;  Laterality: N/A;   RADIOLOGY WITH ANESTHESIA N/A 06/06/2018   Procedure: STENTING;  Surgeon: Luanne Bras, MD;  Location: [place];  Service: Radiology;  Laterality: N/A;   TONSILLECTOMY     TRANSTHORACIC ECHOCARDIOGRAM  08/2017; 04/2018:    A) EF 60-65%. Mild LVH. No RWMA. Gr 1 DD. Mod LA dilation.;  B)  EF 35-40%.  Severe LVH.  GRII DD.   Apical anteroseptal hypokinesis.  Akinesis of the apex.    Allergies: Atorvastatin; Crestor [rosuvastatin]; Other; Pravastatin; Simvastatin; and Statins  Medications: Prior to Admission medications   Medication Sig Start Date End Date Taking? Authorizing Provider  acetaminophen (TYLENOL) 325 MG tablet Take 2 tablets (650 mg total) by mouth every 6 (six) hours as needed for mild pain (or Fever >/= 101). 05/31/18   Angiulli, Lavon Paganini, PA-C  amLODipine (NORVASC) 10 MG tablet Take 1 tablet (10 mg total) by mouth every evening. 05/31/18   Angiulli, Lavon Paganini, PA-C  aspirin 81 MG chewable tablet Chew 1 tablet (81 mg total) by mouth daily. 03/29/16   Isaiah Serge, NP  carvedilol (COREG) 6.25 MG tablet Take 1 tablet (6.25 mg total) by mouth 2 (two) times daily with a meal. 07/21/18   Kilroy, Doreene Burke, PA-C  clopidogrel (PLAVIX) 75 MG tablet Take 1 tablet (75 mg total) by mouth daily. 05/31/18   Angiulli, Lavon Paganini, PA-C  doxazosin (CARDURA) 2 MG tablet TAKE 1 TABLET BY MOUTH EVERY DAY 09/12/18   Erlene Quan, PA-C  Ferrous Sulfate Dried (FERROUS SULFATE IRON) 200 (65 Fe) MG TABS Take 1 tablet by mouth every morning.     [provider]  furosemide (LASIX) 20 MG tablet Take 1 tablet (20 mg total) by mouth daily. 05/31/18   Angiulli, Lavon Paganini, PA-C  glimepiride (AMARYL) 4 MG tablet Take 4 mg by mouth daily with breakfast.    [provider]  isosorbide mononitrate (IMDUR) 60 MG 24 hr tablet Take 1 tablet (60 mg total) by mouth 2 (two) times daily. 05/31/18   Angiulli, Lavon Paganini, PA-C  metFORMIN (GLUCOPHAGE) 1000 MG tablet Take 500 mg by mouth 2 (two) times daily.  07/12/18   [provider]  nitroGLYCERIN (NITROSTAT) 0.4 MG SL tablet Place 1 tablet (0.4 mg total) under the tongue every 5 (five) minutes as needed for chest pain (up to 3 doses). Patient taking differently: Place 0.4 mg under the tongue every 5 (five) minutes x 3 doses as needed for chest pain.  08/25/17   Almyra Deforest, PA    oxybutynin (DITROPAN) 5 MG tablet Take 5 mg by mouth daily as needed for bladder spasms.  09/07/17   [provider]  pantoprazole (PROTONIX) 40 MG tablet Take 40 mg by mouth daily. 06/30/18   [provider]  Vitamin D, Ergocalciferol, (DRISDOL) 1.25 MG (50000 UT) CAPS capsule Take 50,000 Units by mouth every 7 (seven) days. 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   Food insecurity:    Worry: Never true    Inability: Never true   Transportation needs:    Medical: No    Non-medical: No  Tobacco Use   Smoking status: Former Smoker    Packs/day: 0.33    Years: 10.00    Pack years: 3.30    Types: Cigarettes    Last attempt to quit: 1961    Years since quitting: 59.4   Smokeless tobacco: Never Used  Substance and Sexual Activity   Alcohol use: No   Drug use: No   Sexual activity: Never  Lifestyle   Physical activity:    Days per week: 6 days    Minutes per session: 30 min   Stress: Not at all  Relationships   Social connections:    Talks on phone: More than three times a week    Gets together: More than three times a week    Attends religious service: 1 to 4 times per year    Active member of club or organization: Yes    Attends meetings of clubs or organizations: 1 to 4 times per year    Relationship status: Married  Other Topics Concern   Not on file  Social History Narrative   Married father of one.  Previously uses stationary bike routinely.  Now reduced due to other social stressors.   Quit smoking 50 years ago.  Does not drink alcohol    Review of Systems: A  12 point ROS discussed and pertinent positives are indicated in the HPI above.  All other systems are negative.  Review of Systems  Constitutional: Negative for activity change, appetite change, fatigue and fever.  HENT: Negative for tinnitus and trouble swallowing.   Eyes: Negative for visual disturbance.  Respiratory: Negative for cough and shortness of breath.   Cardiovascular: Negative for chest pain.  Gastrointestinal: Negative for abdominal pain, nausea and vomiting.  Musculoskeletal: Negative for back pain and gait problem.  Neurological: Negative for dizziness, tremors, seizures, syncope, facial asymmetry, speech difficulty, weakness, light-headedness, numbness and headaches.  Psychiatric/Behavioral: Negative for behavioral problems and confusion.    Vital Signs: There were no vitals taken for this visit.  Physical Exam Vitals signs reviewed.  Constitutional:      Appearance: Normal appearance.  HENT:     Mouth/Throat:     Mouth: Mucous membranes are moist.  Eyes:     Extraocular Movements: Extraocular movements intact.  Cardiovascular:     Rate and Rhythm: Normal rate and regular rhythm.     Heart sounds: Normal heart sounds.  Pulmonary:     Effort: Pulmonary effort is normal.     Breath sounds: Normal breath sounds.  Abdominal:     General: Bowel sounds are normal.     Palpations: Abdomen is soft.     Tenderness: There is no abdominal tenderness.  Musculoskeletal: Normal range of motion.     Right lower leg: No edema.     Left lower leg: No edema.  Skin:    General: Skin is warm and dry.  Neurological:     Mental Status: He is alert  and oriented to person, place, and time.  Psychiatric:        Mood and Affect: Mood normal.        Behavior: Behavior normal.        Thought Content: Thought content normal.        Judgment: Judgment normal.     Imaging: Vas [place] Le Art Seg Multi (segm&le Reynauds)  Result Date: 10/17/2018 LOWER EXTREMITY DOPPLER STUDY  Indications: Decreased pulses noted on exam. Patient denies any limiting              claudication symptoms. He says his legs and feet had been swelling              in past weeks but this is since improving. High Risk Factors: Hypertension, hyperlipidemia, Diabetes, past history of                    smoking, prior MI, coronary artery disease, prior CVA.  Performing Technologist: Mariane Masters RVT  Examination Guidelines: A complete evaluation includes at minimum, Doppler waveform signals and systolic blood pressure reading at the level of bilateral brachial, anterior tibial, and posterior tibial arteries, when vessel segments are accessible. Bilateral testing is considered an integral part of a complete examination. Photoelectric Plethysmograph (PPG) waveforms and toe systolic pressure readings are included as required and additional duplex testing as needed. Limited examinations for reoccurring indications may be performed as noted.  ABI Findings: +---------+------------------+-----+----------+--------+  Right     Rt Pressure (mmHg) Index Waveform   Comment   +---------+------------------+-----+----------+--------+  Brachial  193                                           +---------+------------------+-----+----------+--------+  CFA                                triphasic            +---------+------------------+-----+----------+--------+  Popliteal                          biphasic             +---------+------------------+-----+----------+--------+  ATA       193                1.00  monophasic           +---------+------------------+-----+----------+--------+  PTA       176                0.91  monophasic           +---------+------------------+-----+----------+--------+  PERO      188                0.97  monophasic           +---------+------------------+-----+----------+--------+  Great Toe 103                0.53                       +---------+------------------+-----+----------+--------+  +---------+------------------+-----+-----------+-------+  Left      Lt Pressure (mmHg) Index Waveform    Comment  +---------+------------------+-----+-----------+-------+  Brachial  183                                           +---------+------------------+-----+-----------+-------+  CFA                                biphasic             +---------+------------------+-----+-----------+-------+  Popliteal                          biphasic             +---------+------------------+-----+-----------+-------+  ATA       199                1.03  multiphasic          +---------+------------------+-----+-----------+-------+  PTA       198                1.03  monophasic           +---------+------------------+-----+-----------+-------+  PERO      203                1.05  multiphasic          +---------+------------------+-----+-----------+-------+  Great Toe 146                0.76                       +---------+------------------+-----+-----------+-------+ +-------+-----------+-----------+------------+------------+  ABI/TBI Today's ABI Today's TBI Previous ABI Previous TBI  +-------+-----------+-----------+------------+------------+  Right   1.0         0.53                                   +-------+-----------+-----------+------------+------------+  Left    1.05        0.76                                   +-------+-----------+-----------+------------+------------+ Arterial wall calcification precludes accurate ankle pressures and ABIs.  Summary: Right: The right toe-brachial index is abnormal. Although ankle brachial indices are within normal limits (0.95-1.29), arterial Doppler waveforms at the ankle suggest some component of arterial occlusive disease. Left: The left toe-brachial index is normal. Although ankle brachial indices are within normal limits (0.95-1.29), arterial Doppler waveforms at the ankle suggest some component of arterial occlusive disease.  *See table(s) above for measurements and observations.  See arterial duplex report.  Vascular consult recommended if clinically indicated. Electronically signed by Quay Burow MD on 10/17/2018 at 4:36:24 PM.    Final    Vas [place] Lower Extremity Arterial Duplex  Result Date: 10/17/2018 LOWER EXTREMITY ARTERIAL DUPLEX STUDY Indications: Decreased pulses noted on exam. Patient denies any limiting              claudication symptoms. He says his legs and feet had been swelling              in past weeks but this is since improving. High Risk Factors: Hypertension, hyperlipidemia, Diabetes, past history of                    smoking, prior MI, coronary artery disease, prior CVA.  Current ABI: Today's ABI's were 1.0 on the right and 1.05 on the left with              overestimation due to medial calcinosis bilaterally.  Performing Technologist: Mariane Masters RVT  Examination Guidelines: A complete evaluation includes B-mode imaging, spectral Doppler, color Doppler, and power Doppler as needed of all accessible portions of each vessel. Bilateral testing is considered an integral part of a complete examination. Limited examinations for reoccurring indications may be performed as noted.  +-----------+--------+-----+--------+----------+-------------+  RIGHT       PSV cm/s Ratio Stenosis Waveform   Comments       +-----------+--------+-----+--------+----------+-------------+  CFA Prox    107                     triphasic                 +-----------+--------+-----+--------+----------+-------------+  DFA         91                      triphasic                 +-----------+--------+-----+--------+----------+-------------+  SFA Prox    84                      triphasic                 +-----------+--------+-----+--------+----------+-------------+  SFA Mid     111                     triphasic  plaque         +-----------+--------+-----+--------+----------+-------------+  SFA Distal  145                     triphasic  plaque          +-----------+--------+-----+--------+----------+-------------+  POP Prox    59                      triphasic                 +-----------+--------+-----+--------+----------+-------------+  POP Distal  60                      triphasic  plaque         +-----------+--------+-----+--------+----------+-------------+  TP Trunk    74                      triphasic                 +-----------+--------+-----+--------+----------+-------------+  ATA Prox    79                      biphasic                  +-----------+--------+-----+--------+----------+-------------+  ATA Mid     0              occluded                           +-----------+--------+-----+--------+----------+-------------+  ATA Distal  44                      monophasic reconstituted  +-----------+--------+-----+--------+----------+-------------+  PTA Prox    0              occluded                           +-----------+--------+-----+--------+----------+-------------+  PTA Mid     0              occluded                           +-----------+--------+-----+--------+----------+-------------+  PTA Distal  30                      monophasic reconstituted  +-----------+--------+-----+--------+----------+-------------+  PERO Prox   128                     triphasic                 +-----------+--------+-----+--------+----------+-------------+  PERO Mid    79                      triphasic                 +-----------+--------+-----+--------+----------+-------------+  PERO Distal 64                      biphasic                  +-----------+--------+-----+--------+----------+-------------+  +-----------+--------+-----+---------------+-----------+--------+  LEFT        PSV cm/s Ratio Stenosis        Waveform    Comments  +-----------+--------+-----+---------------+-----------+--------+  CFA Prox    85                             biphasic              +-----------+--------+-----+---------------+-----------+--------+  DFA         61                              biphasic              +-----------+--------+-----+---------------+-----------+--------+  SFA Prox    76                             biphasic              +-----------+--------+-----+---------------+-----------+--------+  SFA Mid     225            30-49% stenosis biphasic    plaque    +-----------+--------+-----+---------------+-----------+--------+  SFA Distal  132                            biphasic    plaque    +-----------+--------+-----+---------------+-----------+--------+  POP Prox    65                             biphasic              +-----------+--------+-----+---------------+-----------+--------+  POP Distal  63                             biphasic              +-----------+--------+-----+---------------+-----------+--------+  TP Trunk    66  triphasic             +-----------+--------+-----+---------------+-----------+--------+  ATA Prox    36                             biphasic              +-----------+--------+-----+---------------+-----------+--------+  ATA Mid     283            50-74% stenosis biphasic              +-----------+--------+-----+---------------+-----------+--------+  ATA Distal  131                            multiphasic           +-----------+--------+-----+---------------+-----------+--------+  PTA Prox    55                             biphasic              +-----------+--------+-----+---------------+-----------+--------+  PTA Mid     330            50-74% stenosis biphasic              +-----------+--------+-----+---------------+-----------+--------+  PTA Distal  67                             multiphasic           +-----------+--------+-----+---------------+-----------+--------+  PERO Prox   75                             triphasic             +-----------+--------+-----+---------------+-----------+--------+  PERO Mid    105                            triphasic             +-----------+--------+-----+---------------+-----------+--------+  PERO  Distal 74                             biphasic              +-----------+--------+-----+---------------+-----------+--------+ A focal velocity elevation of 225 cm/s was obtained at mid SFA with a VR of 1.1. Findings are characteristic of a high-end 30-49% stenosis based on VR. A 2nd focal velocity elevation was visualized, measuring 330 cm/s at the mid posterior tibial artery with a VR of 6.60. Findings are characteristic of high-end 50-74% stenosis. A 3rd focal velocity elevation was visualized, measuring 283 cm/s at mid anterior tibial artery with post stenotic turbulence with a VR of 10.48. Findings are characteristic of high end 50-74% stenosis.  Summary: Right: Total occlusion noted in the anterior tibial artery with distal reconstitution. Total occlusion noted in the posterior tibial artery with distal reconstitution. Heterogeneous plaque without focal stenosis of the common femoral, superficial femoral  and popliteal arteries. Left: 30-49% stenosis noted in the superficial femoral artery. High-end 50-74% stenosis noted in the anterior tibial artery. High-end 50-74% stenosis noted in the posterior tibial artery.  See table(s) above for measurements and observations. See ABI report. Vascular consult recommended if clinically indicated. Electronically signed by Quay Burow MD on 10/17/2018 at  4:35:48 PM.    Final     Labs:  CBC: Recent Labs    05/30/18 0703 05/31/18 0900 06/06/18 0627 07/21/18 1456  WBC 4.4 4.4 5.0 5.7  HGB 7.4* 7.8* 8.1* 10.5*  HCT 23.7* 24.9* 26.9* 31.5*  PLT 295 318 341 313    COAGS: Recent Labs    05/24/18 1406 06/06/18 0627  INR 1.11 1.10  APTT 32  --     BMP: Recent Labs    05/26/18 0538 05/27/18 0647 05/30/18 0703 06/06/18 0627  NA 138 138 138 140  K 3.6 3.8 3.8 4.1  CL 107 107 107 106  CO2 23 25 24 23   GLUCOSE 109* 117* 111* 141*  BUN 19 19 24* 26*  CALCIUM 8.4* 8.5* 8.3* 8.8*  CREATININE 1.47* 1.45* 1.43* 1.59*  GFRNONAA 43* 44* 44* 39*  GFRAA  50* 51* 51* 45*    LIVER FUNCTION TESTS: Recent Labs    04/19/18 0259 04/27/18 0932 05/24/18 1406 05/30/18 0703  BILITOT 0.4 0.2 0.5 0.6  AST 20 8 15  12*  ALT 10 8 9 9   ALKPHOS 56 76 55 51  PROT 5.3* 6.3 6.6 5.7*  ALBUMIN 3.0* 4.1 3.4* 3.1*    TUMOR MARKERS: No results for input(s): AFPTM, CEA, CA199, CHROMGRNA in the last 8760 hours.  Assessment and Plan:  Previous LlCA stenosis angioplasty/stent 2016 Doing well Follow up in Jan 2020 shows L MCA stenosis Now scheduled for arteriogram with possible angioplasty/stent placement Risks and benefits of cerebral angiogram with intervention were discussed with the patient including, but not limited to bleeding, infection, vascular injury, contrast induced renal failure, stroke or even death.  This interventional procedure involves the use of X-rays and because of the nature of the planned procedure, it is possible that we will have prolonged use of X-ray fluoroscopy.  Potential radiation risks to you include (but are not limited to) the following: - A slightly elevated risk for cancer  several years later in life. This risk is typically less than 0.5% percent. This risk is low in comparison to the normal incidence of human cancer, which is 33% for women and 50% for men according to the [place]. - Radiation induced injury can include skin redness, resembling a rash, tissue breakdown / ulcers and hair loss (which can be temporary or permanent).   The likelihood of either of these occurring depends on the difficulty of the procedure and whether you are sensitive to radiation due to previous procedures, disease, or genetic conditions.   IF your procedure requires a prolonged use of radiation, you will be notified and given written instructions for further action.  It is your responsibility to monitor the irradiated area for the 2 weeks following the procedure and to notify your physician if you are concerned that you have  suffered a radiation induced injury.    All of the patient's questions were answered, patient is agreeable to proceed. Consent signed and in chart. Pt is aware if intervention is performed; he will be admitted overnight into Neuro ICU. Plan for DC in am if stable Agreeable to plan   Thank you for this interesting consult.  I greatly enjoyed meeting [name] and look forward to participating in their care.  A copy of this report was sent to the requesting provider on this date.  Electronically Signed: Lavonia Drafts, PA-C 10/26/2018, 6:58 AM   I spent a total of  30 Minutes   in face to face in clinical  consultation, greater than 50% of which was counseling/coordinating care for cerebral arteriogram with possible L MCA revascularization

## 2018-10-26 NOTE — Transfer of Care (Signed)
Immediate Anesthesia Transfer of Care Note  Patient: [name]  Procedure(s) Performed: RADIOLOGY WITH ANESTHESIA (N/A )  Patient Location: PACU  Anesthesia Type:MAC  Level of Consciousness: awake, alert  and oriented  Airway & Oxygen Therapy: Patient Spontanous Breathing  Post-op Assessment: Report given to RN and Post -op Vital signs reviewed and stable  Post vital signs: Reviewed and stable  Last Vitals:  Vitals Value Taken Time  BP 149/75 10/26/2018 10:55 AM  Temp    Pulse 51 10/26/2018 11:00 AM  Resp 7 10/26/2018 11:00 AM  SpO2 98 % 10/26/2018 11:00 AM  Vitals shown include unvalidated device data.  Last Pain:  Vitals:   10/26/18 0731  TempSrc:   PainSc: 0-No pain      Patients Stated Pain Goal: 2 (53/01/04 0459)  Complications: No apparent anesthesia complications

## 2018-10-26 NOTE — Anesthesia Postprocedure Evaluation (Signed)
Anesthesia Post Note  Patient: [name]  Procedure(s) Performed: RADIOLOGY WITH ANESTHESIA (N/A )     Patient location during evaluation: PACU Anesthesia Type: General Level of consciousness: awake and alert Pain management: pain level controlled Vital Signs Assessment: post-procedure vital signs reviewed and stable Respiratory status: spontaneous breathing, nonlabored ventilation and respiratory function stable Cardiovascular status: stable and blood pressure returned to baseline Postop Assessment: no apparent nausea or vomiting Anesthetic complications: no    Last Vitals:  Vitals:   10/26/18 0733 10/26/18 1056  BP: (!) 168/68   Pulse: (!) 50   Temp:  (!) (P) 36.2 C  SpO2:      Last Pain:  Vitals:   10/26/18 1056  TempSrc:   PainSc: (P) 0-No pain                 Joseph Johns,W. EDMOND

## 2018-10-26 NOTE — Progress Notes (Signed)
RN noticed yellow drainage from suprapubic catheter site.  Pam, RN attempting to call Dr. Vernie Shanks office to inform them.

## 2018-10-26 NOTE — Progress Notes (Signed)
No bleeding or hematoma noted after ambulation 

## 2018-10-26 NOTE — Sedation Documentation (Signed)
Pressure released from right groin site.  Dressing applied.

## 2018-10-26 NOTE — Sedation Documentation (Signed)
Right groin sheath removed by IR Tech Cory, pressure being held.

## 2018-10-26 NOTE — Anesthesia Procedure Notes (Signed)
Procedure Name: MAC Date/Time: 10/26/2018 9:05 AM Performed by: Eligha Bridegroom, CRNA Pre-anesthesia Checklist: Patient identified, Emergency Drugs available, Suction available, Timeout performed and Patient being monitored Patient Re-evaluated:Patient Re-evaluated prior to induction

## 2018-10-26 NOTE — Progress Notes (Signed)
D/c instructions given to wife [name] via telephone (vistior restrictions)  with patient present in patient room.  All questions answered and [name] verbalized understanding.

## 2018-10-26 NOTE — Progress Notes (Addendum)
RN spoke with Dr. Vernie Shanks and he said no need to follow up unless something changes (fever, pain, more bleeding than normal).   Patient instructed to continue to monitor site and make Dr. Vernie Shanks aware if there are any changes such as increase is discharge, fever, swelling, pain, orr bleeding.

## 2018-10-27 ENCOUNTER — Encounter (HOSPITAL_COMMUNITY): Payer: Self-pay | Admitting: Interventional Radiology

## 2018-11-03 DIAGNOSIS — I6602 Occlusion and stenosis of left middle cerebral artery: Secondary | ICD-10-CM | POA: Diagnosis not present

## 2018-11-03 MED ORDER — IOHEXOL 300 MG/ML  SOLN
150.0000 mL | Freq: Once | INTRAMUSCULAR | Status: AC | PRN
  Administered 2018-11-03: 60 mL via INTRA_ARTERIAL

## 2018-11-07 ENCOUNTER — Encounter (HOSPITAL_COMMUNITY): Payer: Self-pay | Admitting: Interventional Radiology

## 2018-11-28 ENCOUNTER — Telehealth: Payer: Self-pay | Admitting: Cardiovascular Disease

## 2018-11-28 NOTE — Telephone Encounter (Signed)
I called to confirm appt for 11-29-18 with Dr Gwenlyn Found,     COVID-19 Pre-Screening Questions:   In the past 7 to 10 days have you had a cough,  shortness of breath, headache, congestion, fever (100 or greater) body aches, chills, sore throat, or sudden loss of taste or sense of smell? no  Have you been around anyone with known Covid 19.  Have you been around anyone who is awaiting Covid 19 test results in the past 7 to 10 days?  no  Have you been around anyone who has been exposed to Covid 19, or has mentioned symptoms of Covid 19 within the past 7 to 10 days?  no  If you have any concerns/questions about symptoms patients report during screening (either on the phone or at threshold). Contact the provider seeing the patient or DOD for further guidance.  If neither are available contact a member of the leadership team.

## 2018-11-29 ENCOUNTER — Encounter: Payer: Self-pay | Admitting: Cardiovascular Disease

## 2018-11-29 ENCOUNTER — Ambulatory Visit (INDEPENDENT_AMBULATORY_CARE_PROVIDER_SITE_OTHER): Payer: Medicare Other | Admitting: Cardiovascular Disease

## 2018-11-29 ENCOUNTER — Other Ambulatory Visit: Payer: Self-pay

## 2018-11-29 VITALS — BP 142/74 | HR 57 | Temp 97.3°F | Ht 74.0 in | Wt 224.0 lb

## 2018-11-29 DIAGNOSIS — I739 Peripheral vascular disease, unspecified: Secondary | ICD-10-CM

## 2018-11-29 DIAGNOSIS — I10 Essential (primary) hypertension: Secondary | ICD-10-CM

## 2018-11-29 HISTORY — DX: Peripheral vascular disease, unspecified: I73.9

## 2018-11-29 NOTE — Patient Instructions (Signed)
Medication Instructions:  Your physician recommends that you continue on your current medications as directed. Please refer to the Current Medication list given to you today.  If you need a refill on your cardiac medications before your next appointment, please call your pharmacy.   Lab work: none If you have labs (blood work) drawn today and your tests are completely normal, you will receive your results only by: Marland Kitchen MyChart Message (if you have MyChart) OR . A paper copy in the mail If you have any lab test that is abnormal or we need to change your treatment, we will call you to review the results.  Testing/Procedures: none  Follow-Up: At [facility], you and your health needs are our priority.  As part of our continuing mission to provide you with exceptional heart care, we have created designated Provider Care Teams.  These Care Teams include your primary Cardiologist (physician) and Advanced Practice Providers (APPs -  Physician Assistants and Nurse Practitioners) who all work together to provide you with the care you need, when you need it. Dennis Bast may schedule a follow up appointment as needed You may see Dr. Gwenlyn Found or one of the following Advanced Practice Providers on your designated Care Team:   . Kerin Ransom, [place] . Almyra Deforest, PA-C . Fabian Sharp, PA-C . Jory Sims, DNP . Rosaria Ferries, PA-C . Roby Lofts, PA-C

## 2018-11-29 NOTE — Progress Notes (Signed)
11/29/2018 HEBERT DOOLING   [birth year]  [id number]  Primary Physician Suzan Garibaldi, [place] Primary Cardiologist: Lorretta Harp MD Lupe Carney, [place]  HPI:  [name] is a 85 y.o. mildly overweight married Caucasian male father of 1 child who is a retired Games developer and referred by Dr. Ellyn Hack for PAD.  He was last seen in the office by Kerin Ransom, PA-C 07/21/2018.  He has a history of CAD status post CABG x2 in 2012 the LIMA to the LAD and a vein to the PDA with multiple interventions to the PDA vein graft as recently as 04/18/2018.  He smoked remotely in his 31s.  He does have treated hypertension but denies diabetes or hyperlipidemia.  He did have a previous left brain stroke treated with carotid PTA.  He apparently was on rosuvastatin in the past but does not appear to be on that currently.  He denies claudication.  Does have some dependent edema on low-dose diuretic.  Recent Dopplers performed 07/21/2018 revealed normal ABIs with triphasic waveforms and some tibial vessel disease.   Current Meds  Medication Sig  . acetaminophen (TYLENOL) 325 MG tablet Take 2 tablets (650 mg total) by mouth every 6 (six) hours as needed for mild pain (or Fever >/= 101).  Marland Kitchen amLODipine (NORVASC) 10 MG tablet Take 1 tablet (10 mg total) by mouth every evening.  Marland Kitchen aspirin 81 MG chewable tablet Chew 1 tablet (81 mg total) by mouth daily.  . carvedilol (COREG) 6.25 MG tablet Take 1 tablet (6.25 mg total) by mouth 2 (two) times daily with a meal.  . clopidogrel (PLAVIX) 75 MG tablet Take 1 tablet (75 mg total) by mouth daily.  Marland Kitchen doxazosin (CARDURA) 2 MG tablet TAKE 1 TABLET BY MOUTH EVERY DAY  . Ferrous Sulfate Dried (FERROUS SULFATE IRON) 200 (65 Fe) MG TABS Take 1 tablet by mouth every morning.   . furosemide (LASIX) 20 MG tablet Take 1 tablet (20 mg total) by mouth daily.  Marland Kitchen glimepiride (AMARYL) 4 MG tablet Take 4 mg by mouth daily with breakfast.  . isosorbide mononitrate (IMDUR) 60 MG 24 hr tablet  Take 1 tablet (60 mg total) by mouth 2 (two) times daily.  . metFORMIN (GLUCOPHAGE) 1000 MG tablet Take 500 mg by mouth 2 (two) times daily.   . nitroGLYCERIN (NITROSTAT) 0.4 MG SL tablet Place 1 tablet (0.4 mg total) under the tongue every 5 (five) minutes as needed for chest pain (up to 3 doses). (Patient taking differently: Place 0.4 mg under the tongue every 5 (five) minutes x 3 doses as needed for chest pain. )  . oxybutynin (DITROPAN) 5 MG tablet Take 5 mg by mouth daily as needed for bladder spasms.   . pantoprazole (PROTONIX) 40 MG tablet Take 40 mg by mouth daily.  . Vitamin D, Ergocalciferol, (DRISDOL) 1.25 MG (50000 UT) CAPS capsule Take 50,000 Units by mouth every 7 (seven) days. Wednesday evening     Allergies  Allergen Reactions  . Atorvastatin Other (See Comments)    Myalgias  . Crestor [Rosuvastatin] Other (See Comments)    Myalgias   . Other Other (See Comments)    No  BLOOD PRODUCTS - Pt is Jehovah's Witness  . Pravastatin Other (See Comments)    Myalgias  . Simvastatin Other (See Comments)    Myalgias   . Statins Other (See Comments)    Cause muscle aches    Social History   Socioeconomic History  . Marital status: Married  Spouse name: [name]  . Number of children: 1  . Years of education: Not on file  . Highest education level: Not on file  Occupational History    Employer: RETIRED  Social Needs  . Financial resource strain: Not very hard  . Food insecurity    Worry: Never true    Inability: Never true  . Transportation needs    Medical: No    Non-medical: No  Tobacco Use  . Smoking status: Former Smoker    Packs/day: 0.33    Years: 10.00    Pack years: 3.30    Types: Cigarettes    Quit date: 1961    Years since quitting: 59.5  . Smokeless tobacco: Never Used  Substance and Sexual Activity  . Alcohol use: No  . Drug use: No  . Sexual activity: Never  Lifestyle  . Physical activity    Days per week: 6 days    Minutes per session: 30 min   . Stress: Not at all  Relationships  . Social connections    Talks on phone: More than three times a week    Gets together: More than three times a week    Attends religious service: 1 to 4 times per year    Active member of club or organization: Yes    Attends meetings of clubs or organizations: 1 to 4 times per year    Relationship status: Married  . Intimate partner violence    Fear of current or ex partner: No    Emotionally abused: No    Physically abused: No    Forced sexual activity: No  Other Topics Concern  . Not on file  Social History Narrative   Married father of one.  Previously uses stationary bike routinely.  Now reduced due to other social stressors.   Quit smoking 50 years ago.  Does not drink alcohol     Review of Systems: General: negative for chills, fever, night sweats or weight changes.  Cardiovascular: negative for chest pain, dyspnea on exertion, edema, orthopnea, palpitations, paroxysmal nocturnal dyspnea or shortness of breath Dermatological: negative for rash Respiratory: negative for cough or wheezing Urologic: negative for hematuria Abdominal: negative for nausea, vomiting, diarrhea, bright red blood per rectum, melena, or hematemesis Neurologic: negative for visual changes, syncope, or dizziness All other systems reviewed and are otherwise negative except as noted above.    Blood pressure (!) 142/74, pulse (!) 57, temperature (!) 97.3 F (36.3 C), height 6\' 2"  (1.88 m), weight 224 lb (101.6 kg).  General appearance: alert and no distress Neck: no adenopathy, no carotid bruit, no JVD, supple, symmetrical, trachea midline and thyroid not enlarged, symmetric, no tenderness/mass/nodules Lungs: clear to auscultation bilaterally Heart: regular rate and rhythm, S1, S2 normal, no murmur, click, rub or gallop Extremities: 2+ pitting edema bilaterally Pulses: 2+ and symmetric Skin: Skin color, texture, turgor normal. No rashes or lesions Neurologic:  Alert and oriented X 3, normal strength and tone. Normal symmetric reflexes. Normal coordination and gait  EKG sinus bradycardia 57 with inferior Q waves and poor R wave progression.  I personally reviewed this EKG.  ASSESSMENT AND PLAN:   Peripheral arterial disease [facility]) Mr. Dantonio was referred to me for PAD.  He really denies claudication he does have some lower extremity dependent edema on low-dose diuretics.  He had lower extremity arterial Doppler studies performed 07/21/2018 revealing normal ABIs bilaterally with some tibial vessel disease but otherwise normal blood flow.      Pearletha Forge.  Gwenlyn Found MD [facility], [facility] 11/29/2018 2:04 PM

## 2018-11-29 NOTE — Assessment & Plan Note (Signed)
[name] was referred to me for PAD.  He really denies claudication he does have some lower extremity dependent edema on low-dose diuretics.  He had lower extremity arterial Doppler studies performed 07/21/2018 revealing normal ABIs bilaterally with some tibial vessel disease but otherwise normal blood flow.

## 2018-12-20 ENCOUNTER — Telehealth: Payer: Self-pay | Admitting: Cardiology

## 2018-12-20 MED ORDER — CLOPIDOGREL BISULFATE 75 MG PO TABS: 75.0000 mg | ORAL_TABLET | Freq: Every day | ORAL | 3 refills | Status: DC

## 2018-12-20 NOTE — Telephone Encounter (Signed)
Called and spoke to patient. Made him aware his refill request has been sent in. Patient verbalized understanding and thanked me for the call.

## 2018-12-20 NOTE — Telephone Encounter (Signed)
New Message     *STAT* If patient is at the pharmacy, call can be transferred to refill team.   1. Which medications need to be refilled? (please list name of each medication and dose if known) Plavix   2. Which pharmacy/location (including street and city if local pharmacy) is medication to be sent to? [facility] [place]   3. Do they need a 30 day or 90 day supply? 90 day

## 2019-01-16 ENCOUNTER — Other Ambulatory Visit: Payer: Self-pay | Admitting: Internal Medicine

## 2019-01-26 ENCOUNTER — Other Ambulatory Visit: Payer: Self-pay | Admitting: Cardiology

## 2019-01-27 ENCOUNTER — Telehealth: Payer: Self-pay | Admitting: Student

## 2019-01-27 NOTE — Telephone Encounter (Signed)
NIR.  Received message from patient complaining of headaches.  Called patient at 2. Patient states that he has been having intermittent headaches for the past 2-3 days. States that he thought it was "just a crick in my neck", but thinks it might be "coming from my head". States pain is located behind bilateral ears. Rates headache as 1-2/10. States that he does not take anything for headaches. States that he has associated dizziness when turning head too quickly. Denies vision changes, syncope, or N/V.  Discussed case with Dr. Estanislado Pandy.  Called patient back and informed him that I spoke with Dr. Estanislado Pandy regarding symptoms. Per Dr. Estanislado Pandy, symptoms unlikely to be related to left MCA M1 or left ICA stenosis. Recommend that patient follow-up with PCP regarding symptoms. All questions answered and concerns addressed. Patient conveys understanding and agrees with plan.   Bea Graff Keiana Tavella, PA-C 01/27/2019, 3:52 PM

## 2019-02-02 ENCOUNTER — Ambulatory Visit (INDEPENDENT_AMBULATORY_CARE_PROVIDER_SITE_OTHER): Payer: Medicare Other | Admitting: Adult Health

## 2019-02-02 ENCOUNTER — Encounter: Payer: Self-pay | Admitting: Adult Health

## 2019-02-02 ENCOUNTER — Telehealth: Payer: Self-pay | Admitting: Cardiology

## 2019-02-02 ENCOUNTER — Other Ambulatory Visit: Payer: Self-pay

## 2019-02-02 ENCOUNTER — Ambulatory Visit: Payer: Medicare Other | Admitting: Adult Health

## 2019-02-02 VITALS — BP 170/80 | HR 55 | Temp 98.4°F | Ht 73.0 in | Wt 224.6 lb

## 2019-02-02 DIAGNOSIS — I63512 Cerebral infarction due to unspecified occlusion or stenosis of left middle cerebral artery: Secondary | ICD-10-CM

## 2019-02-02 DIAGNOSIS — R29898 Other symptoms and signs involving the musculoskeletal system: Secondary | ICD-10-CM | POA: Diagnosis not present

## 2019-02-02 DIAGNOSIS — E1142 Type 2 diabetes mellitus with diabetic polyneuropathy: Secondary | ICD-10-CM | POA: Diagnosis not present

## 2019-02-02 DIAGNOSIS — I6602 Occlusion and stenosis of left middle cerebral artery: Secondary | ICD-10-CM

## 2019-02-02 DIAGNOSIS — E1165 Type 2 diabetes mellitus with hyperglycemia: Secondary | ICD-10-CM

## 2019-02-02 DIAGNOSIS — E785 Hyperlipidemia, unspecified: Secondary | ICD-10-CM

## 2019-02-02 DIAGNOSIS — I10 Essential (primary) hypertension: Secondary | ICD-10-CM

## 2019-02-02 NOTE — Progress Notes (Signed)
I agree with the above plan 

## 2019-02-02 NOTE — Patient Instructions (Addendum)
Continue aspirin 81 mg daily and clopidogrel 75 mg daily  for secondary stroke prevention  Recommended cholesterol management therapy for secondary stroke prevention -follow-up with PCP with repeat lab work and if bad cholesterol or LDL greater than 70, would recommend initiating management  Continue to follow up with PCP regarding cholesterol, blood pressure and diabetes management   Follow-up with vascular surgery for repeat imaging as recommended  Continue to monitor blood pressure at home  Maintain strict control of hypertension with blood pressure goal below 130/90, diabetes with hemoglobin A1c goal below 6.5% and cholesterol with LDL cholesterol (bad cholesterol) goal below 70 mg/dL. I also advised the patient to eat a healthy diet with plenty of whole grains, cereals, fruits and vegetables, exercise regularly and maintain ideal body weight.  Followup in the future with me in 6 months or call earlier if needed       Thank you for coming to see [place] at [facility]. I hope we have been able to provide you high quality care today.  You may receive a patient satisfaction survey over the next few weeks. We would appreciate your feedback and comments so that we may continue to improve ourselves and the health of our patients.

## 2019-02-02 NOTE — Progress Notes (Signed)
[facility] [address] [place]. [place] [address] [phone]       OFFICE FOLLOW-UP NOTE  [name] Date of Birth:  Dec 05, 1930 Medical Record Number:  [id number]   Chief Complaint  Patient presents with   Follow-up    6 mon f/u. Alone. Rm 9. Patient mentioned that he was having pain in neck on both sides.      HPI:   Initial visit 07/27/2018 PS: [name] is a 85 year old Caucasian male seen today for initial office follow-up visit following hospital admission for stroke in January 2020.  History is obtained from the patient and review of electronic medical records.  I personally reviewed imaging films in PACS.  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  His NIH stroke scale was 5 on admission with right facial droop slurred speech and right hand weakness.  He was not given TPA due to unclear last known well.  MRI showed multifocal cortical and subcortical possibly watershed left hemispheric infarcts.  There was a tiny additional right frontal white matter infarct as well.  Carotid ultrasound showed bilateral 1-39% stenosis.  2D echo showed normal ejection fraction.  LDL cholesterol was 81 mg percent and hemoglobin A1c 8.2.  CT angiogram showed severe distal left M1 stenosis.  Patient was seen by Dr. Estanislado Pandy and initially elective left MCA angioplasty and stenting with plan for January 28 however the patient was found to have significant anemia and he was a Sales promotion account executive Witness and refused blood transfusion hence the procedure was postponed.  Patient was meant to follow-up with Dr. Estanislado Pandy but this has not yet happened.  He states he still has weakness in his right hand though his  slurred speech and facial droop appear to have improved.  He has finished home physical and occupational therapy and has been doing home exercises in his right hand regularly.  He is tolerating aspirin and Plavix without bleeding or bruising.  He states his blood pressure fluctuates a lot and usually is on the higher side.  Today it is 166/78.  He remains on Crestor which is tolerating well without any side effects.  His past neurological history significant for TIA in April 2018 with transient dysarthria and right facial droop.  In September 2016 he had left MCA and PCA as well as left ACA/MCA watershed infarcts with high-grade petrous ICA stenosis of 90%.  He underwent angioplasty without stent by Dr. Estanislado Pandy.  He also had prior history of left MCA branch infarct in 2013.  Update 02/02/2019: [name] is being seen today for 71-month stroke follow-up.  He has been stable from a neurological standpoint.  He did recently have mild worsening of right hand weakness and decreased dexterity but he feels as though this was likely due to recent fracture of right hand approximately 5 weeks ago where he was placed in a brace for a couple weeks and unable to perform his daily exercises and range of motion.  He does endorse improvement.  He also was having symptoms of bilateral neck pain that lasted approximately 2 to 3 weeks but has now improved.  He does have occasional dizziness but this is not limiting his daily functioning and improves quickly.  He continues to stay active and is the main  caregiver for his wife.  He continues on aspirin and Plavix without bleeding or bruising.  He was previously on Crestor but apparently was discontinued due to myalgias.  Unable to determine if recent lab work obtained.  Currently not on medication for HDL management.  Glucose levels stable.  Blood pressure today mildly elevated at 170/80.  Continues to follow with PCP for HTN, HLD and DM management.  He did undergo repeat diagnostic  cerebral angiogram with Dr. Estanislado Pandy on 10/26/2018 which continue to show severe high-grade stenosis of left MCA M1 segment at approximately 90% but as this has been stable and patient asymptomatic, recommended continuation of DAPT and ongoing treatment with conservative management.  He will follow-up with repeat CTA in 6 months post procedure.  No further concerns at this time.    ROS:   14 system review of systems is positive for joint pain, dizziness and all other systems negative     PMH:  Past Medical History:  Diagnosis Date   Abdominal aortic aneurysm ([place])    a. Aortic duplex 06/2014: mild aneurysmal dilatation of proximal abdominal aorta measuring 3.4x3.4cm. No sig change from 2012. 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 NSTEMI 11/2010 - CABG x2(LIMA to LAD, SVG to PDA). b. NEG Lexi MV 10/24/13, EF 53%, no perfusion abnormality, septal and apical HK noted. c. NSTEMI 05/2014 - s/p DES to SVG-RPDA 06/18/14 (Xience Alpine DES 3.0 x 18 mm -3.35 mm), EF 60-65; d. 03/2016 STEMI/PCI: LM nl, ost LAD 70%, mLCx 50%, pRCA 95% - mRCA 60%, dRPDA 70%, LIMA->LAD ok, o-p SVG->RPDA 100% (3.0x16 Promus DES overlapping prior stent).   Diastolic dysfunction    a. 03/2016 Echo: EF 60-65%, no rwma, Gr1 DD, triv AI, Ao root 90mm, Asc Ao 62mm, triv MR.   Dizziness    a. Carotid duplex 03/2014: mild fibrous plaque, no significant stenosis.   Foley catheter in place    "been wearing it for a couple months now" (03/26/2016)   HTN (hypertension)    Hyperlipidemia LDL goal <70    Medical history non-contributory    Non-STEMI (non-ST elevated myocardial infarction) ([place]) 11/2010; 03/2018   1. Ostial LAD 70% (to close to [facility] for PCI), subtotal occlusion of the RCA; 2.  In-stent restenosis  SVG-RCA -PTCA.   Pneumonia    as a baby   Postoperative atrial fibrillation ([place]) 11/2010   Post CABG, no sign recurrence   Refusal of blood transfusions as patient is Jehovah's Witness    S/P CABG x 17 November 2010   LIMA-LAD, SVG to PDA (Dr. Servando Snare)   Sleep apnea    Not on CPAP. (03/26/2016)   ST elevation myocardial infarction (STEMI) of inferior wall ([place]) 04/05/2016   Occluded in-stent restenosis/thrombosis of SVG-RCA --> treated with overlapping Promus DES 3.0 mm 16 mm postdilated 3.0 mm).   Stroke [facility]) 2014; 01/2015   a. 2014 with mild right hand weakness, nonhemorrhagic per pt.;; b. - PTA-Stent L ICA 95%    Type II diabetes mellitus ([place])     Social History:  Social History   Socioeconomic History   Marital status: Married    Spouse name: [name]   Number of children: 1   Years of education: Not  on file   Highest education level: Not on file  Occupational History    Employer: RETIRED  Social Needs   Financial resource strain: Not very hard   Food insecurity    Worry: Never true    Inability: Never true   Transportation needs    Medical: No    Non-medical: No  Tobacco Use   Smoking status: Former Smoker    Packs/day: 0.33    Years: 10.00    Pack years: 3.30    Types: Cigarettes    Quit date: 1961    Years since quitting: 59.7   Smokeless tobacco: Never Used  Substance and Sexual Activity   Alcohol use: No   Drug use: No   Sexual activity: Never  Lifestyle   Physical activity    Days per week: 6 days    Minutes per session: 30 min   Stress: Not at all  Relationships   Social connections    Talks on phone: More than three times a week    Gets together: More than three times a week    Attends religious service: 1 to 4 times per year    Active member of club or organization: Yes    Attends meetings of clubs or organizations: 1 to 4 times per year    Relationship status: Married   Intimate partner violence    Fear of current or ex  partner: No    Emotionally abused: No    Physically abused: No    Forced sexual activity: No  Other Topics Concern   Not on file  Social History Narrative   Married father of one.  Previously uses stationary bike routinely.  Now reduced due to other social stressors.   Quit smoking 50 years ago.  Does not drink alcohol    Medications:   Current Outpatient Medications on File Prior to Visit  Medication Sig Dispense Refill   acetaminophen (TYLENOL) 325 MG tablet Take 2 tablets (650 mg total) by mouth every 6 (six) hours as needed for mild pain (or Fever >/= 101).     amLODipine (NORVASC) 10 MG tablet Take 1 tablet (10 mg total) by mouth every evening. 30 tablet 0   aspirin 81 MG chewable tablet Chew 1 tablet (81 mg total) by mouth daily.     carvedilol (COREG) 6.25 MG tablet Take 1 tablet (6.25 mg total) by mouth 2 (two) times daily with a meal. 60 tablet 3   clopidogrel (PLAVIX) 75 MG tablet Take 1 tablet (75 mg total) by mouth daily. 90 tablet 3   doxazosin (CARDURA) 2 MG tablet TAKE 1 TABLET BY MOUTH EVERY DAY 90 tablet 0   Ferrous Sulfate Dried (FERROUS SULFATE IRON) 200 (65 Fe) MG TABS Take 1 tablet by mouth every morning.      furosemide (LASIX) 20 MG tablet Take 1 tablet (20 mg total) by mouth daily. 30 tablet 0   glimepiride (AMARYL) 4 MG tablet Take 4 mg by mouth daily with breakfast.     isosorbide mononitrate (IMDUR) 60 MG 24 hr tablet Take 1 tablet (60 mg total) by mouth 2 (two) times daily. 60 tablet 0   metFORMIN (GLUCOPHAGE) 1000 MG tablet Take 500 mg by mouth 2 (two) times daily.      nitroGLYCERIN (NITROSTAT) 0.4 MG SL tablet Place 1 tablet (0.4 mg total) under the tongue every 5 (five) minutes as needed for chest pain (up to 3 doses). (Patient taking differently: Place 0.4 mg under the tongue every 5 (  five) minutes x 3 doses as needed for chest pain. ) 25 tablet 3   oxybutynin (DITROPAN) 5 MG tablet Take 5 mg by mouth daily as needed for bladder spasms.       pantoprazole (PROTONIX) 40 MG tablet Take 40 mg by mouth daily.     Vitamin D, Ergocalciferol, (DRISDOL) 1.25 MG (50000 UT) CAPS capsule Take 50,000 Units by mouth every 7 (seven) days. Wednesday evening     No current facility-administered medications on file prior to visit.     Allergies:   Allergies  Allergen Reactions   Atorvastatin Other (See Comments)    Myalgias   Crestor [Rosuvastatin] Other (See Comments)    Myalgias    Other Other (See Comments)    No  BLOOD PRODUCTS - Pt is Jehovah's Witness   Pravastatin Other (See Comments)    Myalgias   Simvastatin Other (See Comments)    Myalgias    Statins Other (See Comments)    Cause muscle aches    Physical Exam  Today's Vitals   02/02/19 0934  BP: (!) 170/80  Pulse: (!) 55  Temp: 98.4 F (36.9 C)  TempSrc: Oral  Weight: 224 lb 9.6 oz (101.9 kg)  Height: 6\' 1"  (1.854 m)   Body mass index is 29.63 kg/m.    General: well developed, well nourished pleasant elderly Caucasian male, seated, in no evident distress Head: head normocephalic and atraumatic.  Neck: supple with no carotid or supraclavicular bruits Cardiovascular: regular rate and rhythm, no murmurs Musculoskeletal: no deformity Skin:  no rash/petichiae.  Nevus on the inner aspect of right earlobe Vascular:  Normal pulses all extremities  Neurologic Exam Mental Status: Awake and fully alert. Oriented to place and time. Recent and remote memory intact. Attention span, concentration and fund of knowledge appropriate. Mood and affect appropriate.  Cranial Nerves: Pupils equal, briskly reactive to light. Extraocular movements full without nystagmus. Visual fields full to confrontation. Hearing only diminished bilaterally. Facial sensation intact. Face, tongue, palate moves normally and symmetrically.  Motor: Normal bulk and tone. Normal strength in all tested extremity muscles except moderate weakness of right grip and intrinsic hand muscles and wrist.   Diminished fine finger movements on the right.  Sensory.: intact to touch ,pinprick .position and vibratory sensation.  Coordination: Rapid alternating movements normal in all extremities except mildly diminished right hand. Finger-to-nose showed mild ataxia RUE and heel-to-shin performed accurately bilaterally. Gait and Station: Arises from chair without difficulty. Stance is normal. Gait demonstrates normal stride length and balance Reflexes: 1+ and symmetric. Toes downgoing.      ASSESSMENT: 85 year old Caucasian male with left middle cerebral artery infarct secondary to symptomatic high-grade left middle cerebral artery stenosis in January 2020.  Vascular risk factors of hypertension, diabetes, hyperlipidemia and intracranial stenosis.  Residual deficits of mild RUE weakness and decreased dexterity.  Repeat cerebral angiogram 10/2018 stable and recommended ongoing conservative management.     PLAN: -Continue aspirin 81 mg and Plavix for secondary stroke prevention -Recommend follow-up with PCP for repeat lipid panel and if LDL greater than 70, recommend initiating PSC K9 due to history of statin intolerance -Continue to follow with Dr. Estanislado Pandy with repeat imaging recommended around 04/2019 -Continue to follow with PCP for HTN, HLD and DM management - maintain strict control of hypertension with blood pressure goal below 130/90, diabetes with hemoglobin A1c goal below 6.5% and lipids with LDL cholesterol goal below 70 mg/dL.  I also advised the patient to eat a healthy diet with plenty  of whole grains, cereals, fruits and vegetables, exercise regularly and maintain ideal body weight .  Follow-up in 6 months or call earlier if needed  Greater than 50% of time during this 25 minute visit was spent on counseling,explanation of diagnosis of stroke and middle cerebral artery stenosis, planning of further management, discussion with patient and answering all questions to patient  satisfaction  Frann Rider, [facility]  [facility] [address] [place], [place] [address]  Phone [phone] Fax [phone] Note: This document was prepared with digital dictation and possible smart phrase technology. Any transcriptional errors that result from this process are unintentional.

## 2019-02-02 NOTE — Telephone Encounter (Signed)
LM with spouse to call Dr. Ellyn Hack to schedule followup with Dr. Ellyn Hack.

## 2019-02-18 NOTE — Progress Notes (Signed)
Virtual Visit via Telephone Note   This visit type was conducted due to national recommendations for restrictions regarding the COVID-19 Pandemic (e.g. social distancing) in an effort to limit this patient's exposure and mitigate transmission in our community.  Due to his co-morbid illnesses, this patient is at least at moderate risk for complications without adequate follow up.  This format is felt to be most appropriate for this patient at this time.  The patient did not have access to video technology/had technical difficulties with video requiring transitioning to audio format only (telephone).  All issues noted in this document were discussed and addressed.  No physical exam could be performed with this format.  Please refer to the patient's chart for his  consent to telehealth for [facility]   Date:  02/20/2019   ID:  CLEBURNE SAVINI, DOB [birth year], MRN [id number]  Patient Location: Home Provider Location: Home  PCP:  Suzan Garibaldi, FNP  Cardiologist: Dr. Ellyn Hack Electrophysiologist:  None   Evaluation Performed:  Follow-Up Visit  Chief Complaint:  Follow Up  History of Present Illness:    DARYLL SPISAK is a 85 y.o. male we are following for ongoing assessment and management of coronary artery disease status post CABG x2 in 2012 with LIMA to LAD, and vein graft to PDA with multiple interventions to the PDA graft most recently on 04/18/2018.  Most recent cardiac catheterization on 04/18/2018 by Dr. Ellyn Hack, revealed mostly stable angiography with 70% ostial stent in-stent restenosis of the SVG to RPDA treated with balloon angioplasty.    Other history includes hypertension, AAA, CVA of the left brain treated with carotid PTA, and hyperlipidemia..Additionally Mr. Luft has Type II diabetes, and anemia.   He was also recently seen by Dr. Gwenlyn Found on referral due to abnormal ABIs.  The patient denied claudication but does have some lower extremity dependent edema on low-dose diuretics.   ABIs bilaterally found some tibial vessel disease but otherwise normal flow and therefore no need for further intervention.  Mr. Whalin comes today without any complaints with exception of some mild chest pressure.  The patient has run out of isosorbide, and amlodipine for the last few weeks.  He is trying to find a new PCP in [place] to follow his diabetes and has run out of glimepiride and also vitamin D.  He has not had labs for greater than 6 months.  He states that his blood pressures been running in the 170s over 80s at home.  He is currently on carvedilol but is only taking 3.125 mg twice daily at the request of Dr. Ellyn Hack.   The patient he does not have symptoms concerning for COVID-19 infection (fever, chills, cough, or new shortness of breath).    Past Medical History:  Diagnosis Date  . Abdominal aortic aneurysm ([place])    a. Aortic duplex 06/2014: mild aneurysmal dilatation of proximal abdominal aorta measuring 3.4x3.4cm. No sig change from 2012. F/u due 06/2016;   . AKI (acute kidney injury) ([place]) 03/28/2016  . Anemia    low iron  . Arthritis    "right knee; never bothered me" (03/26/2016)  . 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 CAD in native artery    a. NSTEMI 11/2010 - CABG x2(LIMA to LAD, SVG to PDA). b. NEG Lexi MV 10/24/13, EF 53%, no perfusion abnormality, septal and apical HK noted. c. NSTEMI 05/2014 - s/p DES to SVG-RPDA 06/18/14 (Xience Alpine DES 3.0 x 18 mm -  3.35 mm), EF 60-65; d. 03/2016 STEMI/PCI: LM nl, ost LAD 70%, mLCx 50%, pRCA 95% - mRCA 60%, dRPDA 70%, LIMA->LAD ok, o-p SVG->RPDA 100% (3.0x16 Promus DES overlapping prior stent).  . Diastolic dysfunction    a. 03/2016 Echo: EF 60-65%, no rwma, Gr1 DD, triv AI, Ao root 99mm, Asc Ao 45mm, triv MR.  . Dizziness    a. Carotid duplex 03/2014: mild fibrous plaque, no significant stenosis.  . Foley catheter in place    "been wearing it for a couple months now" (03/26/2016)  . HTN  (hypertension)   . Hyperlipidemia LDL goal <70   . Medical history non-contributory   . Non-STEMI (non-ST elevated myocardial infarction) ([place]) 11/2010; 03/2018   1. Ostial LAD 70% (to close to [facility] for PCI), subtotal occlusion of the RCA; 2.  In-stent restenosis SVG-RCA -PTCA.  Marland Kitchen Pneumonia    as a baby  . Postoperative atrial fibrillation ([place]) 11/2010   Post CABG, no sign recurrence  . Refusal of blood transfusions as patient is Jehov[employer]'s Witness   . 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 Servando Snare)  . Sleep apnea    Not on CPAP. (03/26/2016)  . ST elevation myocardial infarction (STEMI) of inferior wall ([place]) 04/05/2016   Occluded in-stent restenosis/thrombosis of SVG-RCA --> treated with overlapping Promus DES 3.0 mm 16 mm postdilated 3.0 mm).  . Stroke [facility]) 2014; 01/2015   a. 2014 with mild right hand weakness, nonhemorrhagic per pt.;; b. - PTA-Stent L ICA 95%   . Type II diabetes mellitus ([place])    Past Surgical History:  Procedure Laterality Date  . APPENDECTOMY    . CARDIAC CATHETERIZATION  12/11/2010   Dr. Chase Picket - subsequent cath - normal LV systolic function, no renal artery stenosis, severe 2-vessel disease with subtotaled RCA prox and distal 60% lesion and complex 70% area of narrowing of ostium of LAD  . CARDIAC CATHETERIZATION N/A 03/26/2016   Procedure: Left Heart Cath and Coronary Angiography;  Surgeon: Nelva Bush, MD;  Location: [place] CV LAB;  Service: Cardiovascular;  Laterality: N/A;  . CARDIAC CATHETERIZATION N/A 03/26/2016   Procedure: Coronary Stent Intervention;  Surgeon: Nelva Bush, MD;  Location: [place] CV LAB;  Service: Cardiovascular: 100% In-stent thrombosis of pros SVG-RCA (Xience DES) --> treated with PromusDES 3.0 x 18 (3.3 mm)  . CARDIAC CATHETERIZATION N/A 03/26/2016   Procedure: Bypass Graft Angiography;  Surgeon: Nelva Bush, MD;  Location: [place] CV LAB;  Service: Cardiovascular;  Laterality: N/A;  . CORONARY  ANGIOPLASTY WITH STENT PLACEMENT  06/18/2014   PCI to SVG-RPDA 06/18/14 (Xience Alpine DES 3.0 x 18 mm -3.35 mm),   . CORONARY ARTERY BYPASS GRAFT  12/15/2010   X2, Dr Servando Snare; LIMA to LAD, SVG to PDA;   . CORONARY BALLOON ANGIOPLASTY N/A 04/18/2018   Procedure: CORONARY BALLOON ANGIOPLASTY;  Surgeon: Leonie Man, MD;  Location: [place] CV LAB;  Service: Cardiovascular;;; high pressure scoring and noncompliant balloon PTCA of SVG-RCA ISR ostial and proximal  . CORONARY/GRAFT ANGIOGRAPHY N/A 08/27/2017   Procedure: CORONARY/GRAFT ANGIOGRAPHY;  Surgeon: Nelva Bush, MD;  Location: [place] CV LAB;  Service: Cardiovascular;; pLAD 70%, ostD1 50%.  mCx 60%, OM2 80%. pRCA 95% & mRCA 100% - rPDA 70%. LIMA-mLAD patent. SVG-rPDA 10% ISR.   Marland Kitchen CYSTOSCOPY WITH URETHRAL DILATATION    . IR 3D INDEPENDENT WKST  10/26/2018  . IR ANGIO INTRA EXTRACRAN SEL COM CAROTID INNOMINATE BILAT MOD SED  02/25/2017  .  IR ANGIO INTRA EXTRACRAN SEL COM CAROTID INNOMINATE BILAT MOD SED  10/19/2017   Dr. Kathee Delton: L Common Carotid - ECA & major branches widely patent. ICA ~20% distal to bulb & 50% in supraclinoid segment. LMCA-distal 1/3 MI ~905 stenosis with post-stenotic dilation into inferior division. ~50% prox Basilar A stenosis @ anterior Inf Cerebellar A. 50% R ICA  . IR ANGIO INTRA EXTRACRAN SEL COM CAROTID INNOMINATE UNI L MOD SED  06/06/2018  . IR ANGIO INTRA EXTRACRAN SEL COM CAROTID INNOMINATE UNI L MOD SED  10/26/2018  . IR ANGIO VERTEBRAL SEL SUBCLAVIAN INNOMINATE UNI L MOD SED  02/25/2017  . IR ANGIO VERTEBRAL SEL SUBCLAVIAN INNOMINATE UNI R MOD SED  10/19/2017  . IR ANGIO VERTEBRAL SEL VERTEBRAL UNI L MOD SED  10/19/2017  . IR GENERIC HISTORICAL  01/21/2016   IR RADIOLOGIST EVAL & MGMT 01/21/2016 [facility]  . IR GENERIC HISTORICAL  02/03/2016   IR CATHETER TUBE CHANGE 02/03/2016 Marybelle Killings, MD [facility]INTER[facility]  . IR RADIOLOGIST EVAL & MGMT  11/09/2017  . LEFT HEART CATH AND CORONARY ANGIOGRAPHY N/A 04/18/2018    Procedure: LEFT HEART CATH AND CORONARY ANGIOGRAPHY;  Surgeon: Leonie Man, MD;  Location: [place] CV LAB;  Service: Cardiovascular;  Laterality: N/A; stable findings on last cath with exception of 75% in-stent restenosis of SVG-RCA ostial stent treated with PTCA.    Marland Kitchen LEFT HEART CATHETERIZATION WITH CORONARY ANGIOGRAM N/A 06/18/2014   Procedure: LEFT HEART CATHETERIZATION WITH CORONARY ANGIOGRAM;  Surgeon: Leonie Man, MD;  Location: [facility];  Service: Cardiovascular;  -- severe disease of SVG-rPDA  . NO PAST SURGERIES    . RADIOLOGY WITH ANESTHESIA N/A 01/24/2015   Procedure: STENT ASSISTED ANGIOPLASTY (RADIOLOGY WITH ANESTHESIA);  Surgeon: Luanne Bras, MD;  Location: [place];  Service: Radiology;  Laterality: N/A;  . RADIOLOGY WITH ANESTHESIA N/A 06/06/2018   Procedure: STENTING;  Surgeon: Luanne Bras, MD;  Location: [place];  Service: Radiology;  Laterality: N/A;  . RADIOLOGY WITH ANESTHESIA N/A 10/26/2018   Procedure: RADIOLOGY WITH ANESTHESIA;  Surgeon: Luanne Bras, MD;  Location: [place];  Service: Radiology;  Laterality: N/A;  . TONSILLECTOMY    . TRANSTHORACIC ECHOCARDIOGRAM  08/2017; 04/2018:    A) EF 60-65%. Mild LVH. No RWMA. Gr 1 DD. Mod LA dilation.;  B)  EF 35-40%.  Severe LVH.  GRII DD.  Apical anteroseptal hypokinesis.  Akinesis of the apex.     Current Meds  Medication Sig  . acetaminophen (TYLENOL) 325 MG tablet Take 2 tablets (650 mg total) by mouth every 6 (six) hours as needed for mild pain (or Fever >/= 101).  Marland Kitchen amLODipine (NORVASC) 10 MG tablet Take 1 tablet (10 mg total) by mouth every evening.  Marland Kitchen aspirin 81 MG chewable tablet Chew 1 tablet (81 mg total) by mouth daily.  . carvedilol (COREG) 6.25 MG tablet Take 1 tablet (6.25 mg total) by mouth 2 (two) times daily with a meal.  . clopidogrel (PLAVIX) 75 MG tablet Take 1 tablet (75 mg total) by mouth daily.  Marland Kitchen doxazosin (CARDURA) 2 MG tablet TAKE 1 TABLET BY MOUTH EVERY DAY  . Ferrous Sulfate  Dried (FERROUS SULFATE IRON) 200 (65 Fe) MG TABS Take 1 tablet by mouth every morning.   . furosemide (LASIX) 20 MG tablet Take 1 tablet (20 mg total) by mouth daily.  Marland Kitchen glimepiride (AMARYL) 4 MG tablet Take 4 mg by mouth daily with breakfast.  . isosorbide mononitrate (IMDUR) 60 MG 24 hr  tablet Take 1 tablet (60 mg total) by mouth 2 (two) times daily.  . metFORMIN (GLUCOPHAGE) 1000 MG tablet Take 500 mg by mouth 2 (two) times daily.   . nitroGLYCERIN (NITROSTAT) 0.4 MG SL tablet Place 1 tablet (0.4 mg total) under the tongue every 5 (five) minutes as needed for chest pain (up to 3 doses). (Patient taking differently: Place 0.4 mg under the tongue every 5 (five) minutes x 3 doses as needed for chest pain. )  . oxybutynin (DITROPAN) 5 MG tablet Take 5 mg by mouth daily as needed for bladder spasms.   . pantoprazole (PROTONIX) 40 MG tablet Take 40 mg by mouth daily.  . Vitamin D, Ergocalciferol, (DRISDOL) 1.25 MG (50000 UT) CAPS capsule Take 50,000 Units by mouth every 7 (seven) days. Wednesday evening     Allergies:   Atorvastatin, Crestor [rosuvastatin], Other, Pravastatin, Simvastatin, and Statins   Social History   Tobacco Use  . Smoking status: Former Smoker    Packs/day: 0.33    Years: 10.00    Pack years: 3.30    Types: Cigarettes    Quit date: 1961    Years since quitting: 59.8  . Smokeless tobacco: Never Used  Substance Use Topics  . Alcohol use: No  . Drug use: No     Family Hx: The patient's family history includes Heart Problems (age of onset: 38) in his father.  ROS:   Please see the history of present illness.    All other systems reviewed and are negative.   Prior CV studies:   The following studies were reviewed today: Left Heart Cath 04/18/2018 SUMMARY:  Mostly stable angiography with 70% ostial stent in-stent restenosis of SVG-RPDA treated with balloon angioplasty.  There is severe native vessel disease with occluded RCA and mid-distal LAD.  Patent LIMA-LAD  and the remainder of the SVG-PDA.  Severely elevated LVEDP with known reduced EF by echo -consistent with ACUTE ON CHRONIC COMBINED SYSTOLIC AND DIASTOLIC HEART FAILURE  Echocardiogram 05/25/2018 Left ventricle: The cavity size was normal. Wall thickness was   increased in a pattern of moderate LVH. The estimated ejection   fraction was 55%. Wall motion was normal; there were no regional   wall motion abnormalities. Doppler parameters are consistent with   abnormal left ventricular relaxation (grade 1 diastolic   dysfunction). - Aortic valve: There was no stenosis. - Aorta: Mildly dilated aortic root. Aortic root dimension: 42 mm   (ED). - Mitral valve: There was trivial regurgitation. - Left atrium: The atrium was mildly dilated. - Right ventricle: The cavity size was normal. Systolic function   was mildly reduced. - Tricuspid valve: Peak RV-RA gradient (S): 26 mm Hg. - Pulmonary arteries: PA peak pressure: 29 mm Hg (S). - Inferior vena cava: The vessel was normal in size. The   respirophasic diameter changes were in the normal range (= 50%),   consistent with normal central venous pressure.  Labs/Other Tests and Data Reviewed:    EKG:  No ECG reviewed.  Recent Labs: 05/26/2018: TSH 0.609 05/30/2018: ALT 9 10/26/2018: BUN 25; Creatinine, Ser 1.62; Hemoglobin 10.5; Platelets 285; Potassium 4.2; Sodium 140   Recent Lipid Panel Lab Results  Component Value Date/Time   CHOL 140 04/27/2018 09:32 AM   TRIG 101 04/27/2018 09:32 AM   HDL 39 (L) 04/27/2018 09:32 AM   CHOLHDL 3.6 04/27/2018 09:32 AM   CHOLHDL 3.5 04/18/2018 01:36 AM   LDLCALC 81 04/27/2018 09:32 AM    Wt Readings from Last 3  Encounters:  02/20/19 221 lb (100.2 kg)  02/02/19 224 lb 9.6 oz (101.9 kg)  11/29/18 224 lb (101.6 kg)     Objective:    Vital Signs:  BP (!) 174/89   Pulse (!) 47   Ht 6\' 1"  (1.854 m)   Wt 221 lb (100.2 kg)   BMI 29.16 kg/m    VITAL SIGNS:  reviewed GEN:  no acute distress  RESPIRATORY:  normal respiratory effort, symmetric expansion NEURO:  alert and oriented x 3, no obvious focal deficit PSYCH:  normal affect  ASSESSMENT & PLAN:    1.  CAD: He is having some mild chest pressure with exertion without associated dyspnea or diaphoresis.  I believe this is related to uncontrolled hypertension.  He has run out of his medications for the last couple of weeks.  I will restart his isosorbide dinitrate 60 mg twice daily.  He has not had to take any nitroglycerin sublingual.  If symptoms persist with improvement in blood pressure may consider further evaluation with ischemic testing.  2.  Hypertension: He has been out of amlodipine, isosorbide, and is noted blood pressures in the 497W systolic at home.  I have refilled his amlodipine and isosorbide.  I would like him to come back to the office in 1 month for an in person evaluation with blood pressure check.  I will check a BMET to evaluate kidney function.  Also fasting lipids and LFTs for evaluation of lipid status he is intolerant of statins and may benefit from [place].  We will consider this once labs are evaluated.  3.  Diabetes: He is not followed by a PCP regularly.  He is looking for a new person to manage his diabetes.  He has been out of glimepiride.  I will refill this and check a hemoglobin A1c  4.  History of CVA: Continues on aspirin and Plavix.  Checking a CBC with history of anemia.  5.  Low vitamin D: He has been on vitamin D replacement.  Will check vitamin D level and refill his vitamin D supplements.  He will need to follow-up with a PCP for ongoing management.  6.  History of AAA: He will need to have a follow-up ultrasound ordered on next office visit as 1 has not been completed since 2018.  This will be done for ongoing surveillance.  He is currently asymptomatic.  COVID-19 Education: The signs and symptoms of COVID-19 were discussed with the patient and how to seek care for testing (follow up with  PCP or arrange E-visit).  The importance of social distancing was discussed today.  Time:   Today, I have spent 20 minutes with the patient with telehealth technology discussing the above problems.     Medication Adjustments/Labs and Tests Ordered: Current medicines are reviewed at length with the patient today.  Concerns regarding medicines are outlined above.   Tests Ordered: CBC, lipids and LFTs, hemoglobin A1c, BMET, vitamin D level  Medication Changes: No orders of the defined types were placed in this encounter.   Disposition:  Follow up 1 month in person for evaluation and BP check.  Signed, Phill Myron. West Pugh, ANP, AACC  02/20/2019 9:35 AM    [facility]

## 2019-02-20 ENCOUNTER — Telehealth (INDEPENDENT_AMBULATORY_CARE_PROVIDER_SITE_OTHER): Payer: Medicare Other | Admitting: Adult Health

## 2019-02-20 ENCOUNTER — Encounter: Payer: Self-pay | Admitting: Adult Health

## 2019-02-20 VITALS — BP 174/89 | HR 47 | Ht 73.0 in | Wt 221.0 lb

## 2019-02-20 DIAGNOSIS — Z79899 Other long term (current) drug therapy: Secondary | ICD-10-CM

## 2019-02-20 DIAGNOSIS — I714 Abdominal aortic aneurysm, without rupture, unspecified: Secondary | ICD-10-CM

## 2019-02-20 DIAGNOSIS — I2510 Atherosclerotic heart disease of native coronary artery without angina pectoris: Secondary | ICD-10-CM

## 2019-02-20 DIAGNOSIS — E785 Hyperlipidemia, unspecified: Secondary | ICD-10-CM

## 2019-02-20 DIAGNOSIS — E669 Obesity, unspecified: Secondary | ICD-10-CM | POA: Diagnosis not present

## 2019-02-20 DIAGNOSIS — I63239 Cerebral infarction due to unspecified occlusion or stenosis of unspecified carotid arteries: Secondary | ICD-10-CM

## 2019-02-20 DIAGNOSIS — E559 Vitamin D deficiency, unspecified: Secondary | ICD-10-CM

## 2019-02-20 DIAGNOSIS — I10 Essential (primary) hypertension: Secondary | ICD-10-CM

## 2019-02-20 DIAGNOSIS — D508 Other iron deficiency anemias: Secondary | ICD-10-CM

## 2019-02-20 DIAGNOSIS — E1169 Type 2 diabetes mellitus with other specified complication: Secondary | ICD-10-CM

## 2019-02-20 MED ORDER — CARVEDILOL 6.25 MG PO TABS: 6.2500 mg | ORAL_TABLET | Freq: Two times a day (BID) | ORAL | 3 refills | Status: DC

## 2019-02-20 MED ORDER — AMLODIPINE BESYLATE 10 MG PO TABS: 10.0000 mg | ORAL_TABLET | Freq: Every evening | ORAL | 3 refills | Status: DC

## 2019-02-20 MED ORDER — GLIMEPIRIDE 4 MG PO TABS: 4.0000 mg | ORAL_TABLET | Freq: Every day | ORAL | 3 refills | Status: DC

## 2019-02-20 MED ORDER — FUROSEMIDE 20 MG PO TABS: 20.0000 mg | ORAL_TABLET | Freq: Every day | ORAL | 3 refills | Status: DC

## 2019-02-20 MED ORDER — VITAMIN D (ERGOCALCIFEROL) 1.25 MG (50000 UNIT) PO CAPS: 50000.0000 [IU] | ORAL_CAPSULE | ORAL | 0 refills | Status: DC

## 2019-02-20 NOTE — Patient Instructions (Addendum)
Medication Instructions:  Continue current medications  If you need a refill on your cardiac medications before your next appointment, please call your pharmacy.  Labwork: CBC, BMP, A1C, Fasting Lipids and Liver and Vit D HERE IN OUR OFFICE AT [facility]   You will need to fast. DO NOT EAT OR DRINK PAST MIDNIGHT.      Take the provided lab slips with you to the lab for your blood draw.   When you have your labs (blood work) drawn today and your tests are completely normal, you will receive your results only by MyChart Message (if you have MyChart) -OR-  A paper copy in the mail.  If you have any lab test that is abnormal or we need to change your treatment, we will call you to review these results.  Testing/Procedures: None Ordered   Follow-Up: . Your physician recommends that you schedule a follow-up appointment in: 1 Month November 9th @ 1:45 pm   At [facility], you and your health needs are our priority.  As part of our continuing mission to provide you with exceptional heart care, we have created designated Provider Care Teams.  These Care Teams include your primary Cardiologist (physician) and Advanced Practice Providers (APPs -  Physician Assistants and Nurse Practitioners) who all work together to provide you with the care you need, when you need it.  Thank you for choosing [facility] at [facility]!!

## 2019-02-25 LAB — HEPATIC FUNCTION PANEL
ALT: 7 IU/L (ref 0–44)
AST: 10 IU/L (ref 0–40)
Albumin: 4.1 g/dL (ref 3.6–4.6)
Alkaline Phosphatase: 77 IU/L (ref 39–117)
Bilirubin Total: 0.5 mg/dL (ref 0.0–1.2)
Bilirubin, Direct: 0.15 mg/dL (ref 0.00–0.40)
Total Protein: 6.4 g/dL (ref 6.0–8.5)

## 2019-02-25 LAB — BASIC METABOLIC PANEL
BUN/Creatinine Ratio: 17 (ref 10–24)
BUN: 29 mg/dL — ABNORMAL HIGH (ref 8–27)
CO2: 24 mmol/L (ref 20–29)
Calcium: 9.3 mg/dL (ref 8.6–10.2)
Chloride: 101 mmol/L (ref 96–106)
Creatinine, Ser: 1.68 mg/dL — ABNORMAL HIGH (ref 0.76–1.27)
GFR calc Af Amer: 42 mL/min/{1.73_m2} — ABNORMAL LOW (ref >59)
GFR calc non Af Amer: 36 mL/min/{1.73_m2} — ABNORMAL LOW (ref >59)
Glucose: 149 mg/dL — ABNORMAL HIGH (ref 65–99)
Potassium: 5 mmol/L (ref 3.5–5.2)
Sodium: 139 mmol/L (ref 134–144)

## 2019-02-25 LAB — CBC
Hematocrit: 36.6 % — ABNORMAL LOW (ref 37.5–51.0)
Hemoglobin: 12.2 g/dL — ABNORMAL LOW (ref 13.0–17.7)
MCH: 29.8 pg (ref 26.6–33.0)
MCHC: 33.3 g/dL (ref 31.5–35.7)
MCV: 90 fL (ref 79–97)
Platelets: 286 10*3/uL (ref 150–450)
RBC: 4.09 x10E6/uL — ABNORMAL LOW (ref 4.14–5.80)
RDW: 14.1 % (ref 11.6–15.4)
WBC: 8.3 10*3/uL (ref 3.4–10.8)

## 2019-02-25 LAB — VITAMIN D 25 HYDROXY (VIT D DEFICIENCY, FRACTURES): Vit D, 25-Hydroxy: 28.2 ng/mL — ABNORMAL LOW (ref 30.0–100.0)

## 2019-02-25 LAB — LIPID PANEL
Chol/HDL Ratio: 4 ratio (ref 0.0–5.0)
Cholesterol, Total: 163 mg/dL (ref 100–199)
HDL: 41 mg/dL (ref >39)
LDL Chol Calc (NIH): 103 mg/dL — ABNORMAL HIGH (ref 0–99)
Triglycerides: 104 mg/dL (ref 0–149)
VLDL Cholesterol Cal: 19 mg/dL (ref 5–40)

## 2019-02-25 LAB — HEMOGLOBIN A1C
Est. average glucose Bld gHb Est-mCnc: 214 mg/dL
Hgb A1c MFr Bld: 9.1 % — ABNORMAL HIGH (ref 4.8–5.6)

## 2019-02-27 ENCOUNTER — Telehealth: Payer: Self-pay | Admitting: *Deleted

## 2019-02-27 DIAGNOSIS — Z79899 Other long term (current) drug therapy: Secondary | ICD-10-CM

## 2019-02-27 MED ORDER — EZETIMIBE 10 MG PO TABS: 10.0000 mg | ORAL_TABLET | Freq: Every day | ORAL | 6 refills | Status: DC

## 2019-02-27 NOTE — Telephone Encounter (Signed)
Pt aware of his blood work, Zetia 10 mg [facility], BMP ordered and mail to pt.

## 2019-02-27 NOTE — Telephone Encounter (Signed)
-----   Message from Lendon Colonel, NP sent at 02/26/2019 10:57 AM EDT ----- I have reviewed his labs.  LDL is not at goal and he is allergic to statins.  Please begin Zetia 10 mg p.o. daily.  Hemoglobin A1c is elevated and he should follow-up with his PCP for better management.  Vitamin D has improved.  He is just restarted on antihypertensive medications and therefore will need to repeat BMET in 1 month as his kidney function continued to appear strained.  Please make sure his primary care provider has a copy of all of these labs for their review.

## 2019-03-20 ENCOUNTER — Other Ambulatory Visit: Payer: Self-pay | Admitting: Cardiology

## 2019-03-20 NOTE — Telephone Encounter (Signed)
° ° ° °*  STAT* If patient is at the pharmacy, call can be transferred to refill team.   1. Which medications need to be refilled? (please list name of each medication and dose if known) PLAVIX  2. Which pharmacy/location (including street and city if local pharmacy) is [facility] [place]  3. Do they need a 30 day or 90 day supply? [place]

## 2019-03-23 NOTE — Progress Notes (Signed)
Cardiology Office Note   Date:  03/27/2019   ID:  [name], DOB 1931/03/01, MRN [id number]  PCP:  Suzan Garibaldi, FNP  Cardiologist:  Dr. Ellyn Hack  CC:    History of Present Illness: [name] is a 85 y.o. male we are following for ongoing assessment and management of CAD, history of CABG x2 in 2012 (LIMA to LAD, SVG to PDA) with multiple interventions to the PDA graft most recently on 04/18/2018.  Cardiac catheterization on 04/18/2018 by Dr. Ellyn Hack revealed mostly stable angiography with a 70% ostial stent in-stent restenosis of the SVG to RPDA which was treated with balloon angioplasty.  [name] has other history to include hypertension, AAA, CVA of the left brain treated with carotid PTA, and hyperlipidemia, additionally [name] has type 2 diabetes and anemia.  He is also seen by Dr. Gwenlyn Found did abnormal ABIs however the patient denied claudication but did have some lower extremity dependent edema on low-dose diuretics.  ABIs bilaterally did not reveal significant CAD but did show some tibial vessel disease otherwise normal flow and therefore no need for further intervention was planned.  He was last seen via virtual visit on 02/20/2019 and was without complaints.  He had run out of his medications, required refills, and was looking for a new PCP in [facility] to follow his diabetes.  I ordered labs on last office visit to include hemoglobin A1c, BMET, and lipids and LFTs.  Labs revealed a creatinine of 1.68 (he had not restarted any of his medications at the time of the lab work); LDL 103, vitamin D was mildly low, he was not found to be anemic.  He was started on Zetia 10 mg daily as he is allergic to statin therapy.  He comes today without any significant complaint.  He did state that he has some brief "feelings in my heart" but they are vague and he is unable to qualify them by description.  He denies any associated shortness of breath dizziness fatigue or near syncope  when these occur.  He has been medically compliant and is still trying to find a new PCP.  Past Medical History:  Diagnosis Date   Abdominal aortic aneurysm ([place])    a. Aortic duplex 06/2014: mild aneurysmal dilatation of proximal abdominal aorta measuring 3.4x3.4cm. No sig change from 2012. 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 NSTEMI 11/2010 - CABG x2(LIMA to LAD, SVG to PDA). b. NEG Lexi MV 10/24/13, EF 53%, no perfusion abnormality, septal and apical HK noted. c. NSTEMI 05/2014 - s/p DES to SVG-RPDA 06/18/14 (Xience Alpine DES 3.0 x 18 mm -3.35 mm), EF 60-65; d. 03/2016 STEMI/PCI: LM nl, ost LAD 70%, mLCx 50%, pRCA 95% - mRCA 60%, dRPDA 70%, LIMA->LAD ok, o-p SVG->RPDA 100% (3.0x16 Promus DES overlapping prior stent).   Diastolic dysfunction    a. 03/2016 Echo: EF 60-65%, no rwma, Gr1 DD, triv AI, Ao root 77mm, Asc Ao 53mm, triv MR.   Dizziness    a. Carotid duplex 03/2014: mild fibrous plaque, no significant stenosis.   Foley catheter in place    "been wearing it for a couple months now" (03/26/2016)   HTN (hypertension)    Hyperlipidemia LDL  goal <70    Medical history non-contributory    Non-STEMI (non-ST elevated myocardial infarction) ([place]) 11/2010; 03/2018   1. Ostial LAD 70% (to close to [facility] for PCI), subtotal occlusion of the RCA; 2.  In-stent restenosis SVG-RCA -PTCA.   Pneumonia    as a baby   Postoperative atrial fibrillation ([place]) 11/2010   Post CABG, no sign recurrence   Refusal of blood transfusions as patient is Jehov[employer]'s [employer]    S/P CABG x 17 November 2010   LIMA-LAD, SVG to PDA (Dr. Servando Snare)   Sleep apnea    Not on CPAP. (03/26/2016)   ST elevation myocardial infarction (STEMI) of inferior wall ([place]) 04/05/2016   Occluded in-stent  restenosis/thrombosis of SVG-RCA --> treated with overlapping Promus DES 3.0 mm 16 mm postdilated 3.0 mm).   Stroke [facility]) 2014; 01/2015   a. 2014 with mild right hand weakness, nonhemorrhagic per pt.;; b. - PTA-Stent L ICA 95%    Type II diabetes mellitus [facility])     Past Surgical History:  Procedure Laterality Date   APPENDECTOMY     CARDIAC CATHETERIZATION  12/11/2010   Dr. Chase Picket - subsequent cath - normal LV systolic function, no renal artery stenosis, severe 2-vessel disease with subtotaled RCA prox and distal 60% lesion and complex 70% area of narrowing of ostium of LAD   CARDIAC CATHETERIZATION N/A 03/26/2016   Procedure: Left Heart Cath and Coronary Angiography;  Surgeon: Nelva Bush, MD;  Location: [place] CV [facility];  Service: Cardiovascular;  Laterality: N/A;   CARDIAC CATHETERIZATION N/A 03/26/2016   Procedure: Coronary Stent Intervention;  Surgeon: Nelva Bush, MD;  Location: [place] [facility];  Service: Cardiovascular: 100% In-stent thrombosis of pros SVG-RCA (Xience DES) --> treated with PromusDES 3.0 x 18 (3.3 mm)   CARDIAC CATHETERIZATION N/A 03/26/2016   Procedure: Bypass Graft Angiography;  Surgeon: Nelva Bush, MD;  Location: [place] [facility];  Service: Cardiovascular;  Laterality: N/A;   CORONARY ANGIOPLASTY WITH STENT PLACEMENT  06/18/2014   PCI to SVG-RPDA 06/18/14 (Xience Alpine DES 3.0 x 18 mm -3.35 mm),    CORONARY ARTERY BYPASS GRAFT  12/15/2010   X2, Dr Servando Snare; LIMA to LAD, SVG to PDA;    CORONARY BALLOON ANGIOPLASTY N/A 04/18/2018   Procedure: CORONARY BALLOON ANGIOPLASTY;  Surgeon: Leonie Man, MD;  Location: [place] [facility];  Service: Cardiovascular;;; high pressure scoring and noncompliant balloon PTCA of SVG-RCA ISR ostial and proximal   CORONARY/GRAFT ANGIOGRAPHY N/A 08/27/2017   Procedure: CORONARY/GRAFT ANGIOGRAPHY;  Surgeon: Nelva Bush, MD;  Location: [place] [facility];  Service: Cardiovascular;; pLAD 70%, ostD1 50%.   mCx 60%, OM2 80%. pRCA 95% & mRCA 100% - rPDA 70%. LIMA-mLAD patent. SVG-rPDA 10% ISR.    CYSTOSCOPY WITH URETHRAL DILATATION     IR 3D INDEPENDENT WKST  10/26/2018   IR ANGIO INTRA EXTRACRAN SEL COM CAROTID INNOMINATE BILAT MOD SED  02/25/2017   IR ANGIO INTRA EXTRACRAN SEL COM CAROTID INNOMINATE BILAT MOD SED  10/19/2017   Dr. Kathee Delton: L Common Carotid - ECA & major branches widely patent. ICA ~20% distal to bulb & 50% in supraclinoid segment. LMCA-distal 1/3 MI ~905 stenosis with post-stenotic dilation into inferior division. ~50% prox Basilar A stenosis @ anterior Inf Cerebellar A. 50% R ICA   IR ANGIO INTRA EXTRACRAN SEL COM CAROTID INNOMINATE UNI L MOD SED  06/06/2018   IR ANGIO INTRA EXTRACRAN SEL COM CAROTID INNOMINATE UNI L MOD SED  10/26/2018   IR ANGIO VERTEBRAL SEL  SUBCLAVIAN INNOMINATE UNI L MOD SED  02/25/2017   IR ANGIO VERTEBRAL SEL SUBCLAVIAN INNOMINATE UNI R MOD SED  10/19/2017   IR ANGIO VERTEBRAL SEL VERTEBRAL UNI L MOD SED  10/19/2017   IR GENERIC HISTORICAL  01/21/2016   IR RADIOLOGIST EVAL & MGMT 01/21/2016 [facility]   IR GENERIC HISTORICAL  02/03/2016   IR CATHETER TUBE CHANGE 02/03/2016 Marybelle Killings, MD [facility]INTER[facility] RAD   IR RADIOLOGIST EVAL & MGMT  11/09/2017   LEFT HEART CATH AND CORONARY ANGIOGRAPHY N/A 04/18/2018   Procedure: LEFT HEART CATH AND CORONARY ANGIOGRAPHY;  Surgeon: Leonie Man, MD;  Location: [place] [facility];  Service: Cardiovascular;  Laterality: N/A; stable findings on last cath with exception of 75% in-stent restenosis of SVG-RCA ostial stent treated with PTCA.     LEFT HEART CATHETERIZATION WITH CORONARY ANGIOGRAM N/A 06/18/2014   Procedure: LEFT HEART CATHETERIZATION WITH CORONARY ANGIOGRAM;  Surgeon: Leonie Man, MD;  Location: [facility] CATH LAB;  Service: Cardiovascular;  -- severe disease of SVG-rPDA   NO PAST SURGERIES     RADIOLOGY WITH ANESTHESIA N/A 01/24/2015   Procedure: STENT ASSISTED ANGIOPLASTY (RADIOLOGY WITH ANESTHESIA);  Surgeon:  Luanne Bras, MD;  Location: [place];  Service: Radiology;  Laterality: N/A;   RADIOLOGY WITH ANESTHESIA N/A 06/06/2018   Procedure: STENTING;  Surgeon: Luanne Bras, MD;  Location: [place];  Service: Radiology;  Laterality: N/A;   RADIOLOGY WITH ANESTHESIA N/A 10/26/2018   Procedure: RADIOLOGY WITH ANESTHESIA;  Surgeon: Luanne Bras, MD;  Location: [place];  Service: Radiology;  Laterality: N/A;   TONSILLECTOMY     TRANSTHORACIC ECHOCARDIOGRAM  08/2017; 04/2018:    A) EF 60-65%. Mild LVH. No RWMA. Gr 1 DD. Mod LA dilation.;  B)  EF 35-40%.  Severe LVH.  GRII DD.  Apical anteroseptal hypokinesis.  Akinesis of the apex.     Current Outpatient Medications  Medication Sig Dispense Refill   acetaminophen (TYLENOL) 325 MG tablet Take 2 tablets (650 mg total) by mouth every 6 (six) hours as needed for mild pain (or Fever >/= 101).     amLODipine (NORVASC) 10 MG tablet Take 1 tablet (10 mg total) by mouth every evening. 90 tablet 3   aspirin 81 MG chewable tablet Chew 1 tablet (81 mg total) by mouth daily.     carvedilol (COREG) 6.25 MG tablet Take 1 tablet (6.25 mg total) by mouth 2 (two) times daily with a meal. 180 tablet 3   clopidogrel (PLAVIX) 75 MG tablet Take 1 tablet (75 mg total) by mouth daily. 90 tablet 0   doxazosin (CARDURA) 4 MG tablet Take 1 tablet (4 mg total) by mouth daily. 90 tablet 3   ezetimibe (ZETIA) 10 MG tablet Take 1 tablet (10 mg total) by mouth daily. 90 tablet 3   Ferrous Sulfate Dried (FERROUS SULFATE IRON) 200 (65 Fe) MG TABS Take 1 tablet by mouth every morning.      furosemide (LASIX) 20 MG tablet Take 1 tablet (20 mg total) by mouth daily. 90 tablet 3   glimepiride (AMARYL) 4 MG tablet Take 1 tablet (4 mg total) by mouth daily with breakfast. 90 tablet 3   isosorbide mononitrate (IMDUR) 60 MG 24 hr tablet Take 1 tablet (60 mg total) by mouth 2 (two) times daily. 60 tablet 0   metFORMIN (GLUCOPHAGE) 1000 MG tablet Take 0.5 tablets (500 mg  total) by mouth 2 (two) times daily. 180 tablet 3   nitroGLYCERIN (NITROSTAT) 0.4 MG SL tablet Place 1  tablet (0.4 mg total) under the tongue every 5 (five) minutes as needed for chest pain (up to 3 doses). 25 tablet 3   oxybutynin (DITROPAN) 5 MG tablet Take 5 mg by mouth daily as needed for bladder spasms.      pantoprazole (PROTONIX) 40 MG tablet Take 40 mg by mouth daily.     Vitamin D, Ergocalciferol, (DRISDOL) 1.25 MG (50000 UT) CAPS capsule Take 1 capsule (50,000 Units total) by mouth every 7 (seven) days. Wednesday evening 12 capsule 0   No current facility-administered medications for this visit.     Allergies:   Atorvastatin, Crestor [rosuvastatin], Other, Pravastatin, Simvastatin, and Statins    Social History:  The patient  reports that he quit smoking about 59 years ago. His smoking use included cigarettes. He has a 3.30 pack-year smoking history. He has never used smokeless tobacco. He reports that he does not drink alcohol or use drugs.   Family History:  The patient's family history includes Heart Problems (age of onset: 58) in his father.    ROS: All other systems are reviewed and negative. Unless otherwise mentioned in H&P    PHYSICAL EXAM: VS:  BP (!) 163/79    Pulse (!) 56    Temp (!) 96.8 F (36 C)    Ht 6\' 1"  (1.854 m)    Wt 215 lb 6.4 oz (97.7 kg)    SpO2 95%    BMI 28.42 kg/m  , BMI Body mass index is 28.42 kg/m. GEN: Well nourished, well developed, in no acute distress HEENT: normal Neck: no JVD, carotid bruits, or masses Cardiac: RRR; no murmurs, rubs, or gallops,no edema  Respiratory:  Clear to auscultation bilaterally, normal work of breathing GI: soft, nontender, nondistended, + BS MS: no deformity or atrophy Skin: warm and dry, no rash Neuro:  Strength and sensation are intact Psych: euthymic mood, full affect   EKG: Not completed this office visit  Recent Labs: 05/26/2018: TSH 0.609 02/24/2019: ALT 7; BUN 29; Creatinine, Ser 1.68; Hemoglobin  12.2; Platelets 286; Potassium 5.0; Sodium 139    Lipid Panel    Component Value Date/Time   CHOL 163 02/24/2019 0907   TRIG 104 02/24/2019 0907   HDL 41 02/24/2019 0907   CHOLHDL 4.0 02/24/2019 0907   CHOLHDL 3.5 04/18/2018 0136   VLDL 15 04/18/2018 0136   LDLCALC 103 (H) 02/24/2019 0907      Wt Readings from Last 3 Encounters:  03/27/19 215 lb 6.4 oz (97.7 kg)  02/20/19 221 lb (100.2 kg)  02/02/19 224 lb 9.6 oz (101.9 kg)      Other studies Reviewed: Left Heart Cath 04/18/2018 SUMMARY:  Mostly stable angiography with 70% ostial stent in-stent restenosis of SVG-RPDA treated with balloon angioplasty.  There is severe native vessel disease with occluded RCA and mid-distal LAD. Patent LIMA-LAD and the remainder of the SVG-PDA.  Severely elevated LVEDP with known reduced EF by echo -consistent with ACUTE ON CHRONIC COMBINED SYSTOLIC AND DIASTOLIC HEART FAILURE  Echocardiogram 05/25/2018 Left ventricle: The cavity size was normal. Wall thickness was increased in a pattern of moderate LVH. The estimated ejection fraction was 55%. Wall motion was normal; there were no regional wall motion abnormalities. Doppler parameters are consistent with abnormal left ventricular relaxation (grade 1 diastolic dysfunction). - Aortic valve: There was no stenosis. - Aorta: Mildly dilated aortic root. Aortic root dimension: 42 mm (ED). - Mitral valve: There was trivial regurgitation. - Left atrium: The atrium was mildly dilated. - Right ventricle: The  cavity size was normal. Systolic function was mildly reduced. - Tricuspid valve: Peak RV-RA gradient (S): 26 mm Hg. - Pulmonary arteries: PA peak pressure: 29 mm Hg (S). - Inferior vena cava: The vessel was normal in size. The respirophasic diameter changes were in the normal range (= 50%), consistent with normal central venous pressure.  ASSESSMENT AND PLAN:  1.  Coronary artery disease: History of CABG, and balloon  angioplasty of the SVG to RPDA.  The patient is doing well and denies any complaints of bleeding, chest pain, or dizziness.  No changes on his medication regimen at this time.  He is given refills  2.  Hypertension: Not optimal for someone with CAD and diabetes.  He has been compliant with amlodipine carvedilol and Lasix.  Along with isosorbide mononitrate.  I will increase his doxazosin to 4 mg daily.  If he does not experience any dizziness or lightheadedness this may be helpful for blood pressure control.  Due to bradycardia, I do not want to go up on the carvedilol at this time.  He may do better with permissive hypertension although we would like to get him as close to optimal as possible.  3.  Non-insulin-dependent diabetes: Hemoglobin A1c was 9.  I have provided him refills on his Metformin and glimepiride.  He is supposed to have an appointment with his PCP on November 16.  They will need to follow this until he finds a new provider.   Current medicines are reviewed at length with the patient today.    Labs/ tests ordered today include: None  Phill Myron. West Pugh, ANP, AACC   03/27/2019 2:12 PM    [facility] [place] Office [phone] Fax [phone]  Notice: This dictation was prepared with Dragon dictation along with smaller phrase technology. Any transcriptional errors that result from this process are unintentional and may not be corrected upon review.

## 2019-03-24 ENCOUNTER — Telehealth: Payer: Self-pay

## 2019-03-24 MED ORDER — CLOPIDOGREL BISULFATE 75 MG PO TABS: 75.0000 mg | ORAL_TABLET | Freq: Every day | ORAL | 0 refills | Status: DC

## 2019-03-24 NOTE — Telephone Encounter (Signed)
RX sent

## 2019-03-27 ENCOUNTER — Ambulatory Visit (INDEPENDENT_AMBULATORY_CARE_PROVIDER_SITE_OTHER): Payer: Medicare Other | Admitting: Adult Health

## 2019-03-27 ENCOUNTER — Other Ambulatory Visit: Payer: Self-pay

## 2019-03-27 ENCOUNTER — Encounter: Payer: Self-pay | Admitting: Adult Health

## 2019-03-27 VITALS — BP 163/79 | HR 56 | Temp 96.8°F | Ht 73.0 in | Wt 215.4 lb

## 2019-03-27 DIAGNOSIS — I10 Essential (primary) hypertension: Secondary | ICD-10-CM

## 2019-03-27 DIAGNOSIS — E669 Obesity, unspecified: Secondary | ICD-10-CM | POA: Diagnosis not present

## 2019-03-27 DIAGNOSIS — E1169 Type 2 diabetes mellitus with other specified complication: Secondary | ICD-10-CM

## 2019-03-27 DIAGNOSIS — I2510 Atherosclerotic heart disease of native coronary artery without angina pectoris: Secondary | ICD-10-CM

## 2019-03-27 DIAGNOSIS — Z9861 Coronary angioplasty status: Secondary | ICD-10-CM | POA: Diagnosis not present

## 2019-03-27 MED ORDER — EZETIMIBE 10 MG PO TABS: 10.0000 mg | ORAL_TABLET | Freq: Every day | ORAL | 3 refills | Status: DC

## 2019-03-27 MED ORDER — NITROGLYCERIN 0.4 MG SL SUBL: 0.4000 mg | SUBLINGUAL_TABLET | SUBLINGUAL | 3 refills | Status: DC | PRN

## 2019-03-27 MED ORDER — METFORMIN HCL 1000 MG PO TABS: 500.0000 mg | ORAL_TABLET | Freq: Two times a day (BID) | ORAL | 3 refills | Status: DC

## 2019-03-27 MED ORDER — DOXAZOSIN MESYLATE 4 MG PO TABS: 4.0000 mg | ORAL_TABLET | Freq: Every day | ORAL | 3 refills | Status: DC

## 2019-03-27 NOTE — Patient Instructions (Signed)
Medication Instructions:  INCREASE- Doxazosin 4 mg by mouth daily  *If you need a refill on your cardiac medications before your next appointment, please call your pharmacy*  Lab Work: None Ordered  Testing/Procedures: None Ordered  Follow-Up: At [facility], you and your health needs are our priority.  As part of our continuing mission to provide you with exceptional heart care, we have created designated Provider Care Teams.  These Care Teams include your primary Cardiologist (physician) and Advanced Practice Providers (APPs -  Physician Assistants and Nurse Practitioners) who all work together to provide you with the care you need, when you need it.  Your next appointment:   3 months  The format for your next appointment:   In Person  Provider:   Glenetta Hew, MD

## 2019-04-03 ENCOUNTER — Encounter: Payer: Self-pay | Admitting: Adult Health

## 2019-04-03 ENCOUNTER — Ambulatory Visit (INDEPENDENT_AMBULATORY_CARE_PROVIDER_SITE_OTHER): Payer: Medicare Other | Admitting: Adult Health

## 2019-04-03 ENCOUNTER — Ambulatory Visit: Payer: Medicare Other | Admitting: Adult Health

## 2019-04-03 ENCOUNTER — Other Ambulatory Visit: Payer: Self-pay

## 2019-04-03 VITALS — BP 178/92 | HR 50 | Temp 97.4°F | Ht 73.0 in | Wt 216.2 lb

## 2019-04-03 DIAGNOSIS — I10 Essential (primary) hypertension: Secondary | ICD-10-CM

## 2019-04-03 DIAGNOSIS — R29898 Other symptoms and signs involving the musculoskeletal system: Secondary | ICD-10-CM

## 2019-04-03 DIAGNOSIS — E785 Hyperlipidemia, unspecified: Secondary | ICD-10-CM

## 2019-04-03 DIAGNOSIS — I6602 Occlusion and stenosis of left middle cerebral artery: Secondary | ICD-10-CM | POA: Diagnosis not present

## 2019-04-03 DIAGNOSIS — I63512 Cerebral infarction due to unspecified occlusion or stenosis of left middle cerebral artery: Secondary | ICD-10-CM | POA: Diagnosis not present

## 2019-04-03 DIAGNOSIS — E1142 Type 2 diabetes mellitus with diabetic polyneuropathy: Secondary | ICD-10-CM

## 2019-04-03 NOTE — Progress Notes (Signed)
I agree with the above plan 

## 2019-04-03 NOTE — Patient Instructions (Signed)
Continue aspirin 81 mg daily and clopidogrel 75 mg daily  and Zetia for secondary stroke prevention  Follow-up with Dr. Estanislado Pandy next month for BP imaging and further recommendation regarding duration of aspirin and Plavix  Continue to follow up with PCP regarding cholesterol, blood pressure and diabetes management   Continue to do exercises for your right hand regarding ongoing weakness  Continue to monitor blood pressure at home  Maintain strict control of hypertension with blood pressure goal below 130/90, diabetes with hemoglobin A1c goal below 6.5% and cholesterol with LDL cholesterol (bad cholesterol) goal below 70 mg/dL. I also advised the patient to eat a healthy diet with plenty of whole grains, cereals, fruits and vegetables, exercise regularly and maintain ideal body weight.  Followup in the future with me in 6 months or call earlier if needed       Thank you for coming to see [place] at [facility]. I hope we have been able to provide you high quality care today.  You may receive a patient satisfaction survey over the next few weeks. We would appreciate your feedback and comments so that we may continue to improve ourselves and the health of our patients.

## 2019-04-03 NOTE — Progress Notes (Signed)
[facility] [address] [place] [place] [address] [phone]       OFFICE FOLLOW-UP NOTE  Mr. [name] Date of Birth:  1930/08/05 Medical Record Number:  [id number]   Chief Complaint  Patient presents with   Follow-up    2 mon f/u. Alone. Rm 9. Patient mentioned that he has some numbness to his right hand.      HPI:   Initial visit 07/27/2018 PS: [name] is a 85 year old Caucasian male seen today for initial office follow-up visit following hospital admission for stroke in January 2020.  History is obtained from the patient and review of electronic medical records.  I personally reviewed imaging films in PACS.  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  His NIH stroke scale was 5 on admission with right facial droop slurred speech and right hand weakness.  He was not given TPA due to unclear last known well.  MRI showed multifocal cortical and subcortical possibly watershed left hemispheric infarcts.  There was a tiny additional right frontal white matter infarct as well.  Carotid ultrasound showed bilateral 1-39% stenosis.  2D echo showed normal ejection fraction.  LDL cholesterol was 81 mg percent and hemoglobin A1c 8.2.  CT angiogram showed severe distal left M1 stenosis.  Patient was seen by Dr. Estanislado Pandy and initially elective left MCA angioplasty and stenting with plan for January 28 however the patient was found to have significant anemia and he was a Sales promotion account executive Witness and refused blood transfusion hence the procedure was postponed.  Patient was meant to follow-up with Dr. Estanislado Pandy but this has not yet happened.  He states he still has weakness in his right hand though his  slurred speech and facial droop appear to have improved.  He has finished home physical and occupational therapy and has been doing home exercises in his right hand regularly.  He is tolerating aspirin and Plavix without bleeding or bruising.  He states his blood pressure fluctuates a lot and usually is on the higher side.  Today it is 166/78.  He remains on Crestor which is tolerating well without any side effects.  His past neurological history significant for TIA in April 2018 with transient dysarthria and right facial droop.  In September 2016 he had left MCA and PCA as well as left ACA/MCA watershed infarcts with high-grade petrous ICA stenosis of 90%.  He underwent angioplasty without stent by Dr. Estanislado Pandy.  He also had prior history of left MCA branch infarct in 2013.  Update 02/02/2019: [name] is being seen today for 46-month stroke follow-up.  He has been stable from a neurological standpoint.  He did recently have mild worsening of right hand weakness and decreased dexterity but he feels as though this was likely due to recent fracture of right hand approximately 5 weeks ago where he was placed in a brace for a couple weeks and unable to perform his daily exercises and range of motion.  He does endorse improvement.  He also was having symptoms of bilateral neck pain that lasted approximately 2 to 3 weeks but has now improved.  He does have occasional dizziness but this is not limiting his daily functioning and improves quickly.  He continues to stay active and is the main caregiver  for his wife.  He continues on aspirin and Plavix without bleeding or bruising.  He was previously on Crestor but apparently was discontinued due to myalgias.  Unable to determine if recent lab work obtained.  Currently not on medication for HDL management.  Glucose levels stable.  Blood pressure today mildly elevated at 170/80.  Continues to follow with PCP for HTN, HLD and DM management.  He did undergo repeat diagnostic  cerebral angiogram with Dr. Estanislado Pandy on 10/26/2018 which continue to show severe high-grade stenosis of left MCA M1 segment at approximately 90% but as this has been stable and patient asymptomatic, recommended continuation of DAPT and ongoing treatment with conservative management.  He will follow-up with repeat CTA in 6 months post procedure.  No further concerns at this time.  Update 04/03/2019: [name] is a 85 year old male who is being seen today for stroke follow-up with residual right hand weakness/lack of coordination which has been stable.  He continues to do fine motor skill exercises along with range of motion exercises.  Continues on aspirin and Plavix without bleeding or bruising.  Plan on undergoing CTA next month with Dr. Estanislado Pandy due to prior finding of high-grade left MCA M1 segment stenosis.  Repeat lipid panel by cardiology on 02/24/2019 showed LDL 103.  Discussion regarding potential initiation of PCSK9 inhibitors due to statin intolerance but was initiated on Zetia 10 mg daily by cardiology.  Blood pressure today elevated at 178/92.  He does state blood pressure typically elevated during appointments but continues to follow with cardiology with recent changes in antihypertensives.  Currently receiving injections for right-sided knee pain with use of cane for ambulation but denies any recent falls.  Denies new or worsening stroke/TIA symptoms.     ROS:   14 system review of systems is positive for joint pain, weakness and all other systems negative     PMH:  Past Medical History:  Diagnosis Date   Abdominal aortic aneurysm ([place])    a. Aortic duplex 06/2014: mild aneurysmal dilatation of proximal abdominal aorta measuring 3.4x3.4cm. No sig change from 2012. 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 NSTEMI 11/2010 - CABG x2(LIMA to LAD, SVG to PDA). b. NEG Lexi MV 10/24/13, EF 53%, no perfusion abnormality, septal and apical HK noted. c. NSTEMI 05/2014 - s/p DES to SVG-RPDA 06/18/14 (Xience Alpine DES 3.0 x 18 mm -3.35 mm), EF 60-65; d. 03/2016 STEMI/PCI: LM nl, ost LAD 70%, mLCx 50%, pRCA 95% - mRCA 60%, dRPDA 70%, LIMA->LAD ok, o-p SVG->RPDA 100% (3.0x16 Promus DES overlapping prior stent).   Diastolic dysfunction    a. 03/2016 Echo: EF 60-65%, no rwma, Gr1 DD, triv AI, Ao root 27mm, Asc Ao 21mm, triv MR.   Dizziness    a. Carotid duplex 03/2014: mild fibrous plaque, no significant stenosis.   Foley catheter in place    "been wearing it for a couple months now" (03/26/2016)   HTN (hypertension)    Hyperlipidemia LDL goal <70    Medical history non-contributory    Non-STEMI (non-ST elevated myocardial infarction) ([place]) 11/2010; 03/2018   1. Ostial LAD 70% (to close to [facility] for PCI), subtotal occlusion of the RCA; 2.  In-stent restenosis SVG-RCA -PTCA.  Pneumonia    as a baby   Postoperative atrial fibrillation ([place]) 11/2010   Post CABG, no sign recurrence   Refusal of blood transfusions as patient is Jehovah's Witness    S/P CABG x 17 November 2010   LIMA-LAD, SVG to PDA (Dr. Servando Snare)   Sleep apnea    Not on CPAP. (03/26/2016)   ST elevation myocardial infarction (STEMI) of inferior wall ([place]) 04/05/2016   Occluded in-stent restenosis/thrombosis of SVG-RCA --> treated with overlapping Promus DES 3.0 mm 16 mm postdilated 3.0 mm).   Stroke [facility]) 2014; 01/2015   a. 2014 with mild right hand weakness, nonhemorrhagic per pt.;; b. - PTA-Stent L ICA 95%    Type II diabetes mellitus ([place])     Social History:  Social History   Socioeconomic History   Marital status: Married    Spouse name: [name]   Number of children: 1   Years of education: Not on file   Highest education level: Not on file  Occupational History    Employer: RETIRED    Social Designer, fashion/clothing strain: Not very hard   Food insecurity    Worry: Never true    Inability: Never true   Transportation needs    Medical: No    Non-medical: No  Tobacco Use   Smoking status: Former Smoker    Packs/day: 0.33    Years: 10.00    Pack years: 3.30    Types: Cigarettes    Quit date: 1961    Years since quitting: 59.9   Smokeless tobacco: Never Used  Substance and Sexual Activity   Alcohol use: No   Drug use: No   Sexual activity: Never  Lifestyle   Physical activity    Days per week: 6 days    Minutes per session: 30 min   Stress: Not at all  Relationships   Social connections    Talks on phone: More than three times a week    Gets together: More than three times a week    Attends religious service: 1 to 4 times per year    Active member of club or organization: Yes    Attends meetings of clubs or organizations: 1 to 4 times per year    Relationship status: Married   Intimate partner violence    Fear of current or ex partner: No    Emotionally abused: No    Physically abused: No    Forced sexual activity: No  Other Topics Concern   Not on file  Social History Narrative   Married father of one.  Previously uses stationary bike routinely.  Now reduced due to other social stressors.   Quit smoking 50 years ago.  Does not drink alcohol    Medications:   Current Outpatient Medications on File Prior to Visit  Medication Sig Dispense Refill   acetaminophen (TYLENOL) 325 MG tablet Take 2 tablets (650 mg total) by mouth every 6 (six) hours as needed for mild pain (or Fever >/= 101).     amLODipine (NORVASC) 10 MG tablet Take 1 tablet (10 mg total) by mouth every evening. 90 tablet 3   aspirin 81 MG chewable tablet Chew 1 tablet (81 mg total) by mouth daily.     carvedilol (COREG) 6.25 MG tablet Take 1 tablet (6.25 mg total) by mouth 2 (two) times daily with a meal. 180 tablet 3   clopidogrel (PLAVIX) 75 MG tablet Take 1  tablet (75 mg total) by mouth daily. 90 tablet  0   doxazosin (CARDURA) 4 MG tablet Take 1 tablet (4 mg total) by mouth daily. 90 tablet 3   ezetimibe (ZETIA) 10 MG tablet Take 1 tablet (10 mg total) by mouth daily. 90 tablet 3   Ferrous Sulfate Dried (FERROUS SULFATE IRON) 200 (65 Fe) MG TABS Take 1 tablet by mouth every morning.      furosemide (LASIX) 20 MG tablet Take 1 tablet (20 mg total) by mouth daily. 90 tablet 3   glimepiride (AMARYL) 4 MG tablet Take 1 tablet (4 mg total) by mouth daily with breakfast. 90 tablet 3   isosorbide mononitrate (IMDUR) 60 MG 24 hr tablet Take 1 tablet (60 mg total) by mouth 2 (two) times daily. 60 tablet 0   metFORMIN (GLUCOPHAGE) 1000 MG tablet Take 0.5 tablets (500 mg total) by mouth 2 (two) times daily. 180 tablet 3   nitroGLYCERIN (NITROSTAT) 0.4 MG SL tablet Place 1 tablet (0.4 mg total) under the tongue every 5 (five) minutes as needed for chest pain (up to 3 doses). 25 tablet 3   oxybutynin (DITROPAN) 5 MG tablet Take 5 mg by mouth daily as needed for bladder spasms.      pantoprazole (PROTONIX) 40 MG tablet Take 40 mg by mouth daily.     Vitamin D, Ergocalciferol, (DRISDOL) 1.25 MG (50000 UT) CAPS capsule Take 1 capsule (50,000 Units total) by mouth every 7 (seven) days. Wednesday evening 12 capsule 0   No current facility-administered medications on file prior to visit.     Allergies:   Allergies  Allergen Reactions   Atorvastatin Other (See Comments)    Myalgias   Crestor [Rosuvastatin] Other (See Comments)    Myalgias    Other Other (See Comments)    No  BLOOD PRODUCTS - Pt is Jehovah's Witness   Pravastatin Other (See Comments)    Myalgias   Simvastatin Other (See Comments)    Myalgias    Statins Other (See Comments)    Cause muscle aches    Physical Exam  Today's Vitals   04/03/19 1306  BP: (!) 178/92  Pulse: (!) 50  Temp: (!) 97.4 F (36.3 C)  TempSrc: Oral  Weight: 216 lb 3.2 oz (98.1 kg)  Height: 6\' 1"   (1.854 m)   Body mass index is 28.52 kg/m.   General: well developed, well nourished pleasant elderly Caucasian male, seated, in no evident distress Head: head normocephalic and atraumatic.  Neck: supple with no carotid or supraclavicular bruits Cardiovascular: regular rate and rhythm, no murmurs Musculoskeletal: no deformity Skin:  no rash/petichiae.  Vascular:  Normal pulses all extremities  Neurologic Exam Mental Status: Awake and fully alert. Oriented to place and time. Recent and remote memory intact. Attention span, concentration and fund of knowledge appropriate. Mood and affect appropriate.  Cranial Nerves: Pupils equal, briskly reactive to light. Extraocular movements full without nystagmus. Visual fields full to confrontation. Hearing only diminished bilaterally. Facial sensation intact. Face, tongue, palate moves normally and symmetrically.  Motor: Normal bulk and tone. Normal strength in all tested extremity muscles except mild right hand grip weakness and decreased ROM of fingers. Sensory.: intact to touch ,pinprick .position and vibratory sensation.  Coordination: Rapid alternating movements normal in all extremities except difficulty performing on right hand. Finger-to-nose showed mild ataxia RUE and heel-to-shin performed accurately bilaterally. Gait and Station: Arises from chair without difficulty. Stance is normal. Gait demonstrates favoring of right leg to the knee pain and use of cane Reflexes: 1+ and symmetric. Toes downgoing.  ASSESSMENT: 85 year old Caucasian male with left middle cerebral artery infarct secondary to symptomatic high-grade left middle cerebral artery stenosis in January 2020.  Vascular risk factors of hypertension, diabetes, hyperlipidemia and intracranial stenosis. Repeat cerebral angiogram 10/2018 stable and recommended ongoing conservative management.  Residual deficits of mild right hand weakness with decreased fine motor control but overall  stable     PLAN: -Continue aspirin 81 mg and Plavix for secondary stroke prevention -Continue to follow with Dr. Estanislado Pandy with repeat imaging recommended around 04/2019 along with further recommendation for TPA duration -Continuation of Zetia for HLD management and secondary stroke prevention monitored by cardiology -Continue to follow with PCP/cardiology for HTN, HLD and DM management -Advised to routinely monitor blood pressure at home and follow-up with cardiology who has recently made changes to antihypertensive regimen - maintain strict control of hypertension with blood pressure goal below 130/90, diabetes with hemoglobin A1c goal below 6.5% and lipids with LDL cholesterol goal below 70 mg/dL.  I also advised the patient to eat a healthy diet with plenty of whole grains, cereals, fruits and vegetables, exercise regularly and maintain ideal body weight .  Follow-up in 6 months or call earlier if needed  Greater than 50% of time during this 25 minute visit was spent on counseling,explanation of diagnosis of stroke and middle cerebral artery stenosis, planning of further management, discussion with patient and answering all questions to patient satisfaction  Frann Rider, [facility]  [facility] [address] [place], [place] [address]  Phone [phone] Fax [phone] Note: This document was prepared with digital dictation and possible smart phrase technology. Any transcriptional errors that result from this process are unintentional.

## 2019-05-02 ENCOUNTER — Other Ambulatory Visit: Payer: Self-pay | Admitting: Physician Assistant

## 2019-05-02 ENCOUNTER — Other Ambulatory Visit (HOSPITAL_COMMUNITY): Payer: Self-pay | Admitting: Interventional Radiology

## 2019-05-02 DIAGNOSIS — I771 Stricture of artery: Secondary | ICD-10-CM

## 2019-05-06 ENCOUNTER — Other Ambulatory Visit: Payer: Self-pay | Admitting: Cardiology

## 2019-05-10 ENCOUNTER — Other Ambulatory Visit: Payer: Self-pay | Admitting: Adult Health

## 2019-05-15 ENCOUNTER — Encounter (HOSPITAL_COMMUNITY): Payer: Self-pay

## 2019-05-15 ENCOUNTER — Ambulatory Visit (HOSPITAL_COMMUNITY): Admission: RE | Admit: 2019-05-15 | Payer: Medicare Other | Source: Ambulatory Visit

## 2019-06-15 ENCOUNTER — Other Ambulatory Visit: Payer: Self-pay | Admitting: Cardiology

## 2019-06-22 ENCOUNTER — Telehealth (HOSPITAL_COMMUNITY): Payer: Self-pay

## 2019-06-22 NOTE — Telephone Encounter (Signed)
Called to reschedule cta. Pt does not want to schedule at this time. He will reschedule in a couple months. AW

## 2019-06-27 ENCOUNTER — Ambulatory Visit: Payer: Medicare Other | Admitting: Cardiology

## 2019-08-03 ENCOUNTER — Ambulatory Visit: Payer: Medicare Other | Admitting: Adult Health

## 2019-08-07 ENCOUNTER — Ambulatory Visit: Payer: Medicare Other | Admitting: Cardiology

## 2019-08-24 IMAGING — US US ART/VEN ABD/PELV/SCROTUM DOPPLER LTD
1 series · 14 of 25 positions shown · non-contrast
Comparison: CT of the pelvis 12/18/2017

CLINICAL DATA: 86-year-old with bilateral testicular hardness and
tenderness.

EXAM:
SCROTAL ULTRASOUND
DOPPLER ULTRASOUND OF THE TESTICLES
TECHNIQUE: Complete ultrasound examination of the testicles, epididymis, and
other scrotal structures was performed. Color and spectral Doppler
ultrasound were also utilized to evaluate blood flow to the
testicles.

[Series 1: us art/ven abd/pelv/scrotum doppler ltd · 0.10mm/px · 14 of 45 slices shown]
[im 1/45]
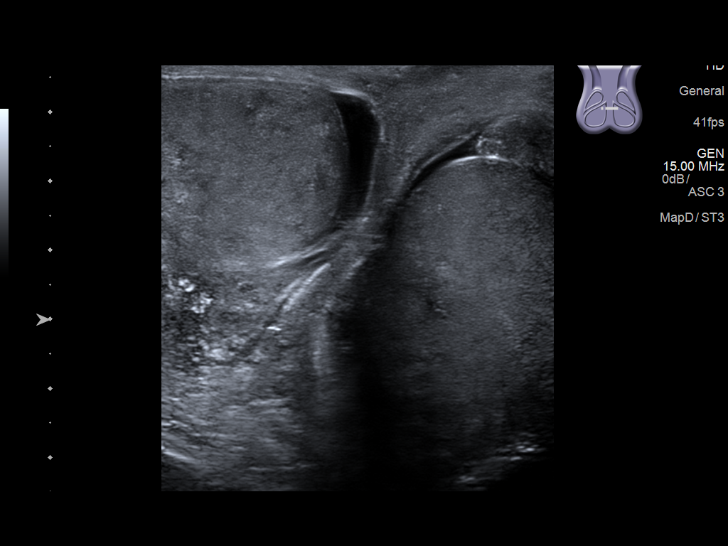
[im 4/45]
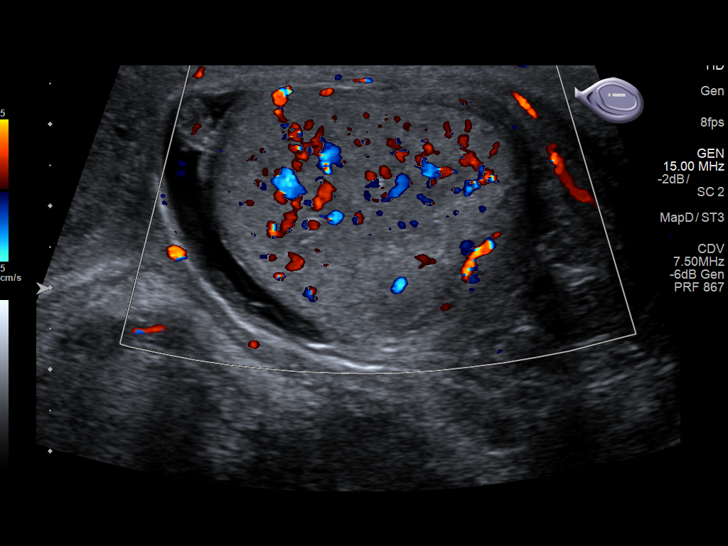
[im 8/45]
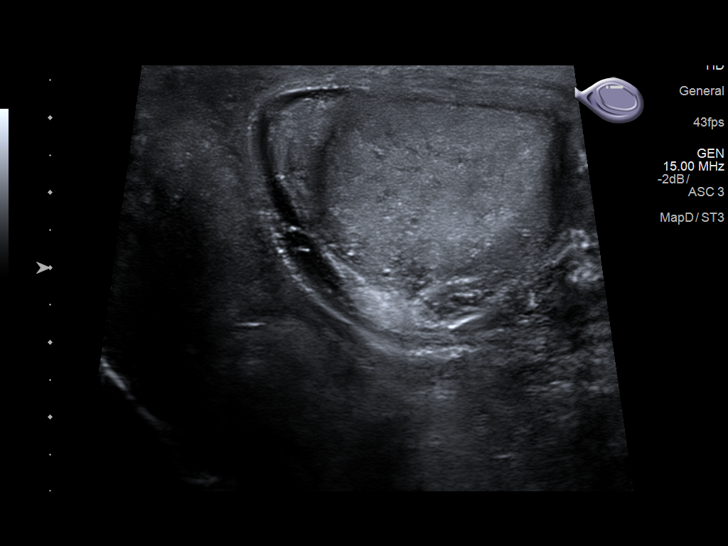
[im 12/45]
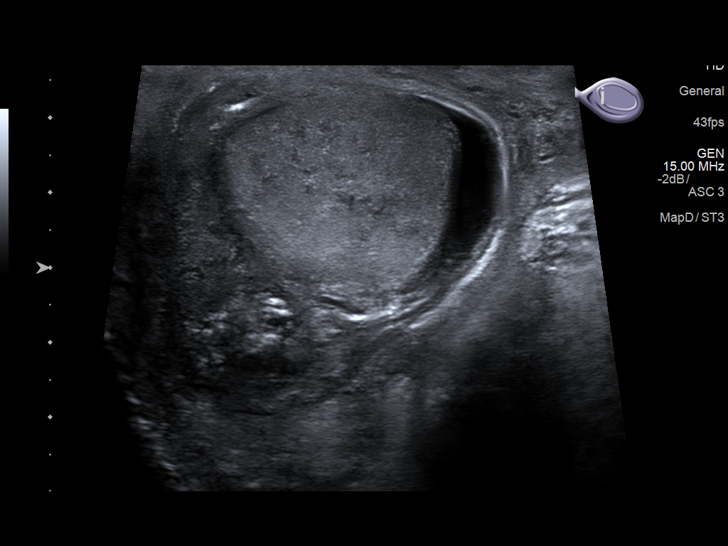
[im 15/45]
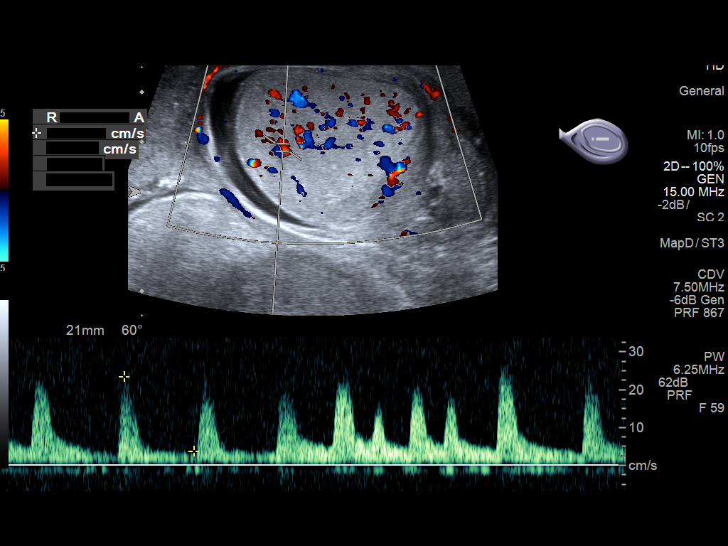
[im 17/45]
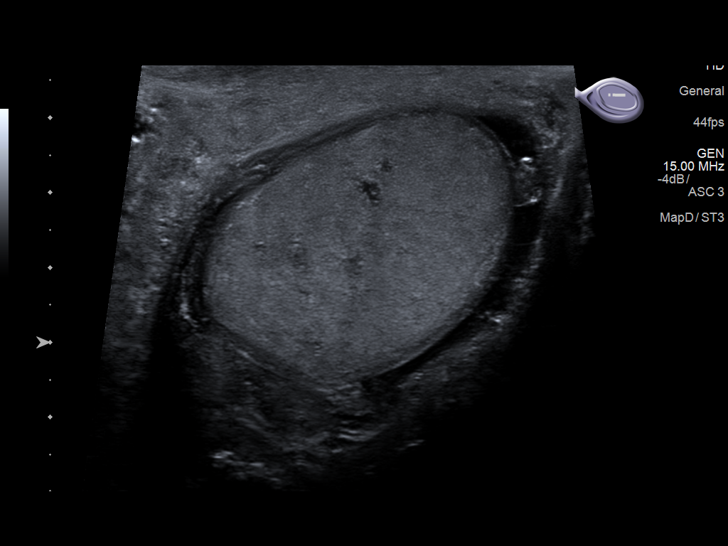
[im 21/45]
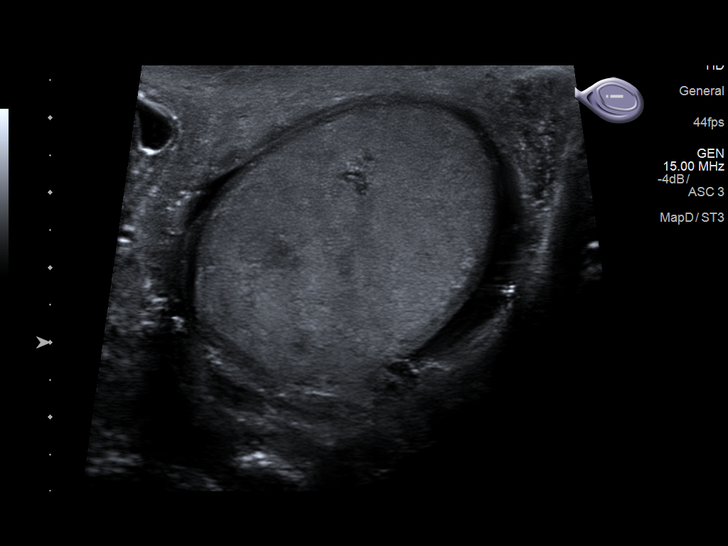
[im 24/45]
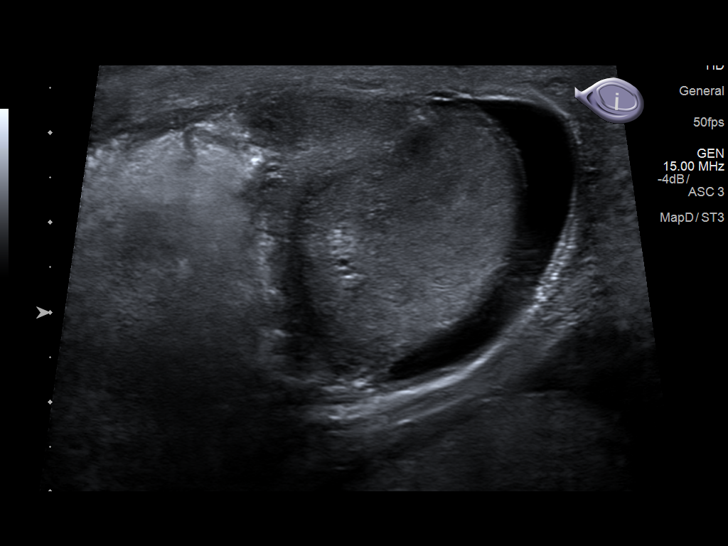
[im 28/45]
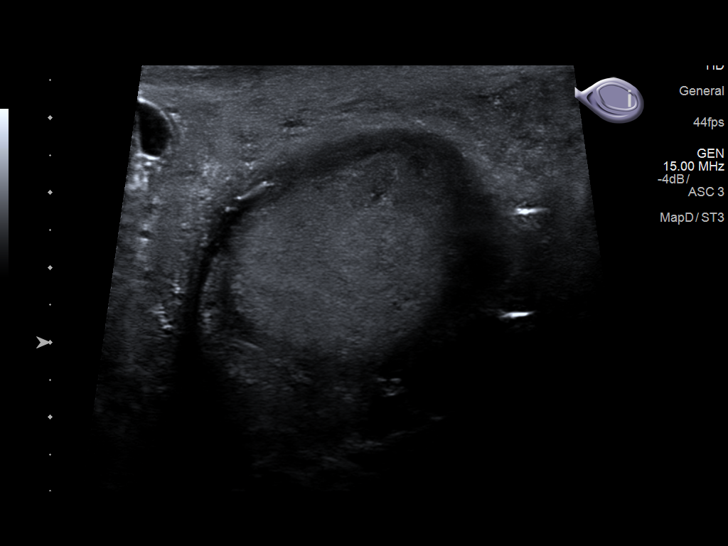
[im 30/45]
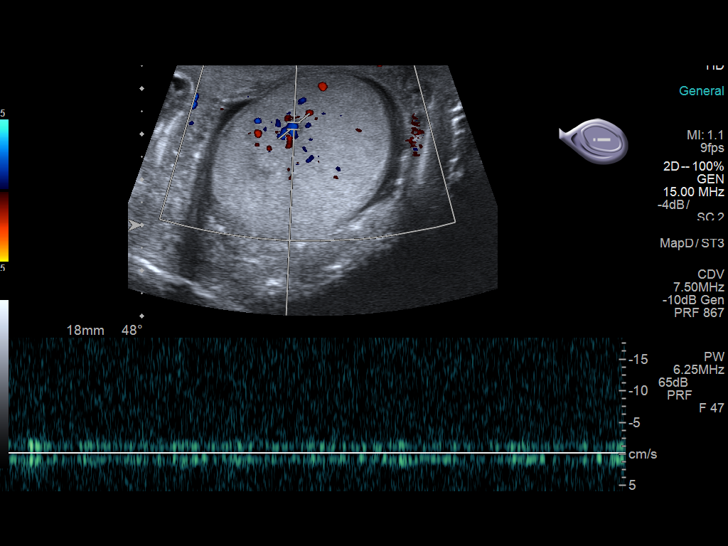
[im 34/45]
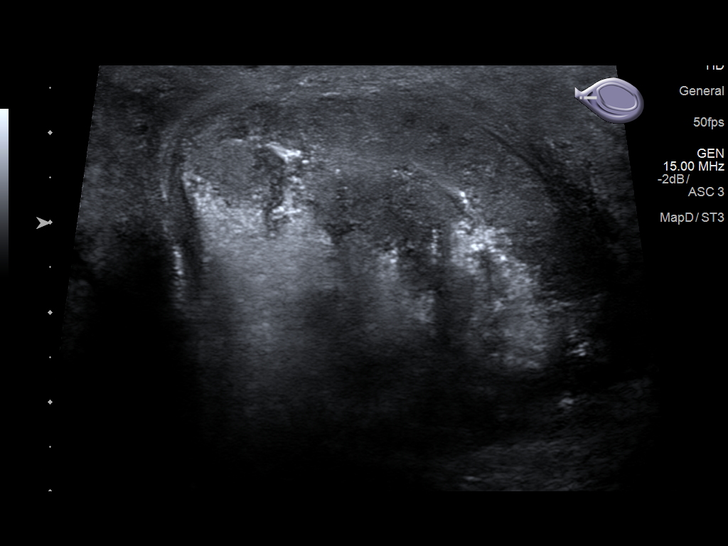
[im 37/45]
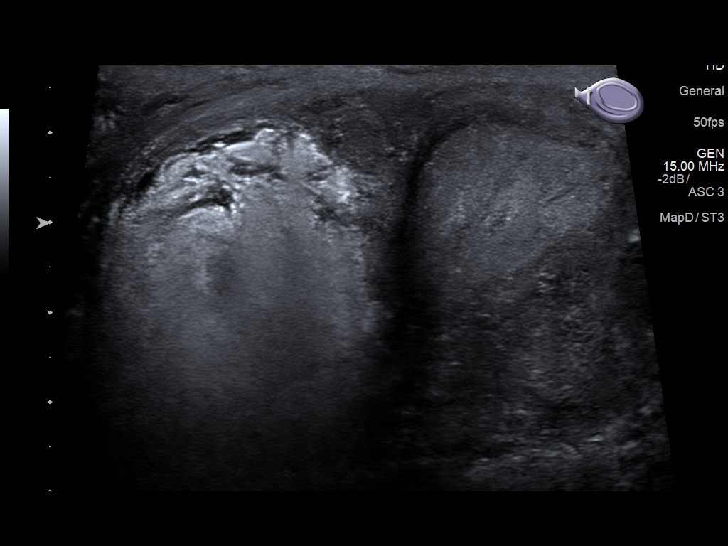
[im 41/45]
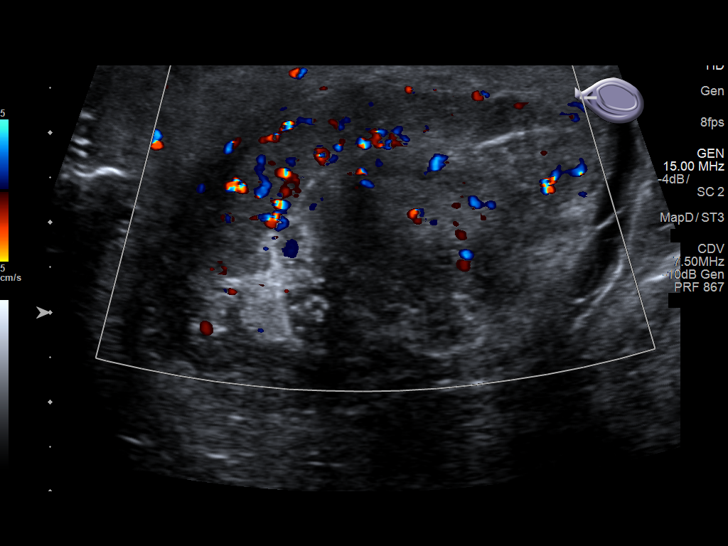
[im 45/45]
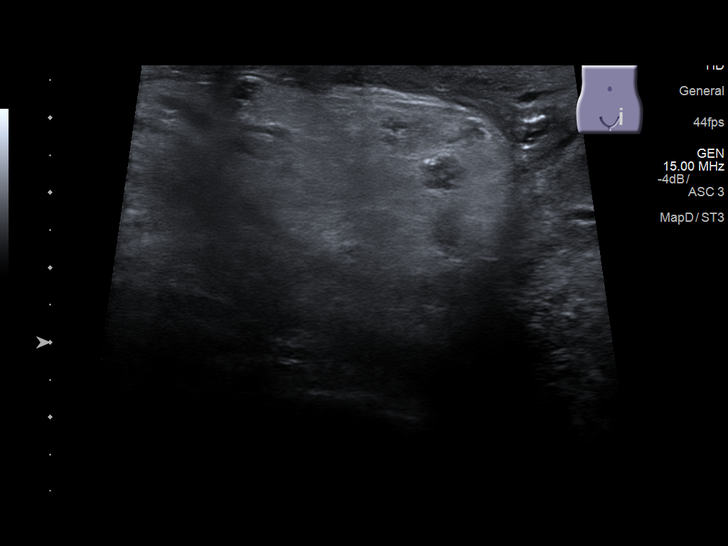

[14 of 25 positions shown; findings below may reference images not displayed]

FINDINGS: Right testicle

Measurements: 4.0 x 2.9 x 3.5 cm. No mass or microlithiasis
visualized. Testicle is mildly heterogeneous.

Left testicle

Measurements: 4.5 x 3.0 x 3.3 cm. No mass or microlithiasis
visualized. Testicle is mildly heterogeneous.

Right epididymis:  Normal in size and appearance.

Left epididymis:  Normal in size and appearance.

Hydrocele:  Small bilateral hydroceles.

Varicocele:  Right varicocele.

Pulsed Doppler interrogation of both testes demonstrates normal low
resistance arterial and venous waveforms bilaterally.
IMPRESSION: Negative for testicular torsion.  No evidence for a testicular mass.

Right varicocele.

## 2019-09-04 ENCOUNTER — Other Ambulatory Visit: Payer: Self-pay

## 2019-09-04 ENCOUNTER — Ambulatory Visit (HOSPITAL_COMMUNITY)
Admission: RE | Admit: 2019-09-04 | Discharge: 2019-09-04 | Disposition: A | Discharge status: Home / Self Care | Payer: Medicare Other | Source: Ambulatory Visit | Attending: Cardiovascular Disease | Admitting: Cardiovascular Disease

## 2019-09-04 DIAGNOSIS — I714 Abdominal aortic aneurysm, without rupture, unspecified: Secondary | ICD-10-CM

## 2019-09-12 NOTE — Progress Notes (Signed)
No significant AAA noted. Both iliac arteries in the legs shows mild dilatation which is stable.

## 2019-09-13 ENCOUNTER — Telehealth: Payer: Self-pay

## 2019-09-13 NOTE — Telephone Encounter (Addendum)
Left a voice message for the patient to give our office a call back for his recent results from his ultrasound.  ----- Message from Almyra Deforest, [place] sent at 09/12/2019  4:40 PM EDT ----- No significant AAA noted. Both iliac arteries in the legs shows mild dilatation which is stable.

## 2019-09-21 ENCOUNTER — Other Ambulatory Visit: Payer: Self-pay | Admitting: Cardiology

## 2019-10-02 ENCOUNTER — Ambulatory Visit: Payer: Medicare Other | Admitting: Adult Health

## 2019-10-04 NOTE — Progress Notes (Signed)
Tired to reach patient multiple times to give results of ultrasound. Mailed out letter with comments.

## 2019-10-26 ENCOUNTER — Telehealth: Payer: Self-pay | Admitting: Cardiology

## 2019-10-26 NOTE — Telephone Encounter (Signed)
Spoke with patient's wife per DPR. Patient is not at home, he went to the wellness clinic for evaluation.

## 2019-10-26 NOTE — Telephone Encounter (Signed)
New Message  Pt c/o of Chest Pain: STAT if CP now or developed within 24 hours  1. Are you having CP right now? No, went away after a few minutes  2. Are you experiencing any other symptoms (ex. SOB, nausea, vomiting, sweating)? Increase BP 195/100,  3. How long have you been experiencing CP? This morning  4. Is your CP continuous or coming and going? Coming and going  5. Have you taken Nitroglycerin? No ?

## 2019-11-13 ENCOUNTER — Encounter: Payer: Self-pay | Admitting: Cardiology

## 2019-11-13 ENCOUNTER — Other Ambulatory Visit: Payer: Self-pay

## 2019-11-13 ENCOUNTER — Ambulatory Visit (INDEPENDENT_AMBULATORY_CARE_PROVIDER_SITE_OTHER): Payer: Medicare Other | Admitting: Cardiology

## 2019-11-13 VITALS — BP 205/99 | HR 63 | Temp 97.2°F | Ht 73.0 in | Wt 215.0 lb

## 2019-11-13 DIAGNOSIS — I25709 Atherosclerosis of coronary artery bypass graft(s), unspecified, with unspecified angina pectoris: Secondary | ICD-10-CM | POA: Diagnosis not present

## 2019-11-13 DIAGNOSIS — Z95820 Peripheral vascular angioplasty status with implants and grafts: Secondary | ICD-10-CM

## 2019-11-13 DIAGNOSIS — E1169 Type 2 diabetes mellitus with other specified complication: Secondary | ICD-10-CM

## 2019-11-13 DIAGNOSIS — I10 Essential (primary) hypertension: Secondary | ICD-10-CM

## 2019-11-13 DIAGNOSIS — I214 Non-ST elevation (NSTEMI) myocardial infarction: Secondary | ICD-10-CM | POA: Diagnosis not present

## 2019-11-13 DIAGNOSIS — E785 Hyperlipidemia, unspecified: Secondary | ICD-10-CM

## 2019-11-13 DIAGNOSIS — I2510 Atherosclerotic heart disease of native coronary artery without angina pectoris: Secondary | ICD-10-CM | POA: Diagnosis not present

## 2019-11-13 DIAGNOSIS — Z9861 Coronary angioplasty status: Secondary | ICD-10-CM | POA: Diagnosis not present

## 2019-11-13 DIAGNOSIS — I5032 Chronic diastolic (congestive) heart failure: Secondary | ICD-10-CM

## 2019-11-13 MED ORDER — CARVEDILOL 6.25 MG PO TABS: 9.3750 mg | ORAL_TABLET | Freq: Two times a day (BID) | ORAL | 3 refills | Status: DC

## 2019-11-13 MED ORDER — FUROSEMIDE 20 MG PO TABS: 20.0000 mg | ORAL_TABLET | Freq: Every day | ORAL | 3 refills | Status: DC

## 2019-11-13 MED ORDER — HYDRALAZINE HCL 25 MG PO TABS: ORAL_TABLET | ORAL | 3 refills | Status: DC

## 2019-11-13 MED ORDER — ISOSORBIDE MONONITRATE ER 60 MG PO TB24: 60.0000 mg | ORAL_TABLET | Freq: Two times a day (BID) | ORAL | 3 refills | Status: DC

## 2019-11-13 NOTE — Patient Instructions (Signed)
Medication Instructions:    Tonight   - take  2 tablets of Carvedilol(   Total 12.5 mg) And then starting tomorrow  Increase to 1 and 1/2 tablets ( Total 9.375 mg ) twice day   Start  Taking Hydralazine 25 mg twice a day-- take  Extra  Tablet  As needed Systolic  Greater than 099   *If you need a refill on your cardiac medications before your next appointment, please call your pharmacy*   Lab Work:  cmp Lipid Hgb A1c If you have labs (blood work) drawn today and your tests are completely normal, you will receive your results only by: Marland Kitchen MyChart Message (if you have MyChart) OR . A paper copy in the mail If you have any lab test that is abnormal or we need to change your treatment, we will call you to review the results.   Testing/Procedures: Not needed   Follow-Up: At [facility], you and your health needs are our priority.  As part of our continuing mission to provide you with exceptional heart care, we have created designated Provider Care Teams.  These Care Teams include your primary Cardiologist (physician) and Advanced Practice Providers (APPs -  Physician Assistants and Nurse Practitioners) who all work together to provide you with the care you need, when you need it.  We recommend signing up for the patient portal called "MyChart".  Sign up information is provided on this After Visit Summary.  MyChart is used to connect with patients for Virtual Visits (Telemedicine).  Patients are able to view lab/test results, encounter notes, upcoming appointments, etc.  Non-urgent messages can be sent to your provider as well.   To learn more about what you can do with MyChart, go to [email or web address].    Your next appointment:   4 month(s)  The format for your next appointment:   Either In Person or Virtual  Provider:   Glenetta Hew, MD   Other Instructions Your physician recommends that you schedule a follow-up appointment in 2 months with [place]-- blood pressure

## 2019-11-13 NOTE — Progress Notes (Signed)
Primary Care Provider: Suzan Garibaldi, FNP Cardiologist: Glenetta Hew, MD Electrophysiologist: None  Clinic Note: Chief Complaint  Patient presents with  . Follow-up    Pretty stable from cardiac standpoint  . Coronary Artery Disease    No true angina.  Chest pain is relieved with Tylenol   HPI:    ROBEY MASSMANN is a 86 y.o. male with a PMH notable for CAD-CABG with PCI as well as AAA and CVA who presents today for delayed 75-month follow-up   CAD: Non-STEMI July 2012-CABG x2 (LIMA-LAD, SVG-PDA)  2016 PCI to SVG-PDA  STEMI November 2017 -> occluded SVG-RCA stent-overlapping Promus DES  Multiple PCI's SVG-PDA -> most recently in December 20 19-70% ISR of the ostial stent treated with PTA.  AAA followed by vascular surgery  June 2019 stroke: Status post carotid PTA   LDA Dopplers show relatively normal ABIs with tibial disease.  I last saw Samin in December 2019.  ELIZANDRO LAURA was last seen on March 27, 2019 by Jory Sims, NP --> doxazosin dose increased to 4 mg daily for additional hypertension control.  Beta-blocker not increased due to bradycardia.  Recent Hospitalizations:   June 13, 2019-ER for gross hematuria/urinary retention  August 04, 2019: Acute cholecystitis - ERCP  Reviewed  CV studies:    The following studies were reviewed today: (if available, images/films reviewed: From Epic Chart or Care Everywhere)  . 09/04/2019 abdominal Doppler:: Largest aortic diameter was 2.9 cm.  There is ectasia of both common and external iliacs.   Interval History:   RAHIL PASSEY returns today overall doing fairly well.  He says he has off-and-on episodes of chest discomfort which are usually relieved with Tylenol.  He has not had taken nitroglycerin.  He feels occasional skipped beats here and there but nothing prolonged.  He does not do a lot of activity because he is limited by knee pain.  He actually has to use a walker sometimes. Despite having high  blood pressure he denies any significant headaches or blurred vision.  He may just not a little more fatigued than usual.  No recurrent stroke symptoms.  About 6 weeks ago he had some pretty significant swelling but it started on down now.  CV Review of Symptoms (Summary) Cardiovascular ROS: positive for - dyspnea on exertion, edema, irregular heartbeat and Off and on episodes of discomfort in the upper chest.  Usually treats this with Tylenol.  6 weeks ago he had pretty significant swelling in his legs but is now back to normal. negative for - loss of consciousness, orthopnea, paroxysmal nocturnal dyspnea, rapid heart rate, shortness of breath or Syncope/near syncope, TIA/amaurosis fugax,  claudication.  The patient does not have symptoms concerning for COVID-19 infection (fever, chills, cough, or new shortness of breath).  The patient is practicing social distancing & Masking.   ++ COVID VACCINE COMPLETE  REVIEWED OF SYSTEMS   Review of Systems  Constitutional: Negative for malaise/fatigue and weight loss.  HENT: Negative for congestion and nosebleeds.   Respiratory: Negative for shortness of breath.   Gastrointestinal: Negative for blood in stool and melena.  Genitourinary: Negative for hematuria.  Musculoskeletal: Positive for joint pain (Significant right knee pain limits walking.).  Neurological: Positive for focal weakness (Stable weakness of the right hand.  Has less finger dexterity.). Negative for dizziness (Unless he stated that quickly) and headaches.  Psychiatric/Behavioral: Negative for depression. The patient is not nervous/anxious.    I have reviewed and (if needed) personally updated the  patient's problem list, medications, allergies, past medical and surgical history, social and family history.   PAST MEDICAL HISTORY   Past Medical History:  Diagnosis Date  . Abdominal aortic aneurysm ([place])    a. Aortic duplex 06/2014: mild aneurysmal dilatation of proximal abdominal  aorta measuring 3.4x3.4cm. No sig change from 2012. F/u due 06/2016;   . Acute blood loss anemia   . AKI (acute kidney injury) ([place]) 03/28/2016  . Anemia of chronic disease   . Apnea, sleep   . Arthritis    "right knee; never bothered me" (03/26/2016)  . 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 Bilateral lower extremity edema 03/30/2014  . Bradycardia 08/31/2013  . CAD in native artery    a. NSTEMI 11/2010 - CABG x2(LIMA to LAD, SVG to PDA). b. NEG Lexi MV 10/24/13, EF 53%, no perfusion abnormality, septal and apical HK noted. c. NSTEMI 05/2014 - s/p DES to SVG-RPDA 06/18/14 (Xience Alpine DES 3.0 x 18 mm -3.35 mm), EF 60-65; d. 03/2016 STEMI/PCI: LM nl, ost LAD 70%, mLCx 50%, pRCA 95% - mRCA 60%, dRPDA 70%, LIMA->LAD ok, o-p SVG->RPDA 100% (3.0x16 Promus DES overlapping prior stent).  . Carpal tunnel syndrome of right wrist 06/24/2015  . Cerebral infarction due to stenosis of left middle cerebral artery ([place]) 01/22/2015  . Chronic anemia 04/17/2018  . Chronic combined systolic and diastolic CHF (congestive heart failure) ([place])   . Coronary artery disease involving coronary bypass graft of native heart with angina pectoris ([place]) 09/19/2014   Cath for Inf STEMI 03/27/16  Conclusions: Significant native coronary artery disease, including 70% ostial LAD stenosis, 50% mid LCx lesion, and 99% proximal RCA disease with TIMI-1 flow (chronic per prior cath reports). Patent LIMA to LAD. Acutely occluded SVG to PDA within previously placed stent. Successful PCI to proximal SVG to PDA in-stent restenosis/thrombosis with placement of a Promus Pre  . Coronary artery disease involving native coronary artery of native heart with angina pectoris ([place])    a. NSTEMI 11/2010 - CABG x2(LIMA to LAD, SVG to PDA-11/2010). b. NEGATIVE Lexiscan Myoview 6/9/'15, EF 53%, no perfusion abnormality, septal and apical HK noted. C. NSTEMI 05/2014 - s/p DES to SVG-RPDA 06/18/14, EF 60-65%; -> Last Cath 08/2017 -  patent stents in SVG-rPDA.  . Diabetes mellitus treated with oral medication ([place]) 03/21/2015  . Diastolic dysfunction    a. 03/2016 Echo: EF 60-65%, no rwma, Gr1 DD, triv AI, Ao root 29mm, Asc Ao 66mm, triv MR.  . Dilated cardiomyopathy ([place]) 04/26/2018  . Dizziness    a. Carotid duplex 03/2014: mild fibrous plaque, no significant stenosis.  . Essential hypertension   . Foley catheter in place    "been wearing it for a couple months now" (03/26/2016)  . Hemispheric carotid artery syndrome 12/22/2016  . HTN (hypertension)   . Hyperlipidemia associated with type 2 diabetes mellitus ([place])   . Hypertensive crisis   . Left middle cerebral artery stroke ([place]) 05/27/2018  . Leukocytosis 04/17/2018  . Non-STEMI (non-ST elevated myocardial infarction) ([place]) 11/2010; 03/2018   1. Ostial LAD 70% (to close to [facility] for PCI), subtotal occlusion of the RCA; 2.  In-stent restenosis SVG-RCA -PTCA.  Marland Kitchen Peripheral arterial disease ([place]) 11/29/2018   Peripheral arterial disease  . Pneumonia    as a baby  . Polypharmacy 09/19/2014  . Poorly controlled type 2 diabetes mellitus with peripheral neuropathy ([place])   . Postoperative atrial fibrillation ([place]) 11/2010   Post CABG, no sign  recurrence  . Refusal of blood transfusions as patient is Jehovah's Witness   . 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 Servando Snare)  . Sleep apnea    Not on CPAP. (03/26/2016)  . ST elevation myocardial infarction (STEMI) of inferior wall ([place]) 04/05/2016   Occluded in-stent restenosis/thrombosis of SVG-RCA --> treated with overlapping Promus DES 3.0 mm 16 mm postdilated 3.0 mm).  . STEMI (ST elevation myocardial infarction) ([place]) 06/18/2014  . Stenosis of left carotid artery   . Stroke [facility]) 2014; 01/2015   a. 2014 with mild right hand weakness, nonhemorrhagic per pt.;; b. - PTA-Stent L ICA 95%   . Suprapubic catheter ([place])   . Vitamin D deficiency 05/27/2018    PAST SURGICAL HISTORY   Past Surgical History:  Procedure  Laterality Date  . APPENDECTOMY    . CARDIAC CATHETERIZATION  12/11/2010   Dr. Chase Picket - subsequent cath - normal LV systolic function, no renal artery stenosis, severe 2-vessel disease with subtotaled RCA prox and distal 60% lesion and complex 70% area of narrowing of ostium of LAD  . CARDIAC CATHETERIZATION N/A 03/26/2016   Procedure: Left Heart Cath and Coronary Angiography;  Surgeon: Nelva Bush, MD;  Location: [place] CV LAB;  Service: Cardiovascular;  Laterality: N/A;  . CARDIAC CATHETERIZATION N/A 03/26/2016   Procedure: Coronary Stent Intervention;  Surgeon: Nelva Bush, MD;  Location: [place] CV LAB;  Service: Cardiovascular: 100% In-stent thrombosis of pros SVG-RCA (Xience DES) --> treated with PromusDES 3.0 x 18 (3.3 mm)  . CARDIAC CATHETERIZATION N/A 03/26/2016   Procedure: Bypass Graft Angiography;  Surgeon: Nelva Bush, MD;  Location: [place] CV LAB;  Service: Cardiovascular;  Laterality: N/A;  . CORONARY ANGIOPLASTY WITH STENT PLACEMENT  06/18/2014   PCI to SVG-RPDA 06/18/14 (Xience Alpine DES 3.0 x 18 mm -3.35 mm),   . CORONARY ARTERY BYPASS GRAFT  12/15/2010   X2, Dr Servando Snare; LIMA to LAD, SVG to PDA;   . CORONARY BALLOON ANGIOPLASTY N/A 04/18/2018   Procedure: CORONARY BALLOON ANGIOPLASTY;  Surgeon: Leonie Man, MD;  Location: [place] CV LAB;  Service: Cardiovascular;;; high pressure scoring and noncompliant balloon PTCA of SVG-RCA ISR ostial and proximal  . CORONARY/GRAFT ANGIOGRAPHY N/A 08/27/2017   Procedure: CORONARY/GRAFT ANGIOGRAPHY;  Surgeon: Nelva Bush, MD;  Location: [place] CV LAB;  Service: Cardiovascular;; pLAD 70%, ostD1 50%.  mCx 60%, OM2 80%. pRCA 95% & mRCA 100% - rPDA 70%. LIMA-mLAD patent. SVG-rPDA 10% ISR.   Marland Kitchen CYSTOSCOPY WITH URETHRAL DILATATION    . IR 3D INDEPENDENT WKST  10/26/2018  . IR ANGIO INTRA EXTRACRAN SEL COM CAROTID INNOMINATE BILAT MOD SED  02/25/2017  . IR ANGIO INTRA EXTRACRAN SEL COM CAROTID INNOMINATE  BILAT MOD SED  10/19/2017   Dr. Kathee Delton: L Common Carotid - ECA & major branches widely patent. ICA ~20% distal to bulb & 50% in supraclinoid segment. LMCA-distal 1/3 MI ~905 stenosis with post-stenotic dilation into inferior division. ~50% prox Basilar A stenosis @ anterior Inf Cerebellar A. 50% R ICA  . IR ANGIO INTRA EXTRACRAN SEL COM CAROTID INNOMINATE UNI L MOD SED  06/06/2018  . IR ANGIO INTRA EXTRACRAN SEL COM CAROTID INNOMINATE UNI L MOD SED  10/26/2018  . IR ANGIO VERTEBRAL SEL SUBCLAVIAN INNOMINATE UNI L MOD SED  02/25/2017  . IR ANGIO VERTEBRAL SEL SUBCLAVIAN INNOMINATE UNI R MOD SED  10/19/2017  . IR ANGIO VERTEBRAL SEL VERTEBRAL UNI L MOD SED  10/19/2017  . IR  GENERIC HISTORICAL  01/21/2016   IR RADIOLOGIST EVAL & MGMT 01/21/2016 [facility]  . IR GENERIC HISTORICAL  02/03/2016   IR CATHETER TUBE CHANGE 02/03/2016 Marybelle Killings, MD [facility]INTER[facility] RAD  . IR RADIOLOGIST EVAL & MGMT  11/09/2017  . LEFT HEART CATH AND CORONARY ANGIOGRAPHY N/A 04/18/2018   Procedure: LEFT HEART CATH AND CORONARY ANGIOGRAPHY;  Surgeon: Leonie Man, MD;  Location: [place] CV LAB;  Service: Cardiovascular;  Laterality: N/A; stable findings on last cath with exception of 75% in-stent restenosis of SVG-RCA ostial stent treated with PTCA.    Marland Kitchen LEFT HEART CATHETERIZATION WITH CORONARY ANGIOGRAM N/A 06/18/2014   Procedure: LEFT HEART CATHETERIZATION WITH CORONARY ANGIOGRAM;  Surgeon: Leonie Man, MD;  Location: [facility] CATH LAB;  Service: Cardiovascular;  -- severe disease of SVG-rPDA  . NO PAST SURGERIES    . RADIOLOGY WITH ANESTHESIA N/A 01/24/2015   Procedure: STENT ASSISTED ANGIOPLASTY (RADIOLOGY WITH ANESTHESIA);  Surgeon: Luanne Bras, MD;  Location: [place];  Service: Radiology;  Laterality: N/A;  . RADIOLOGY WITH ANESTHESIA N/A 06/06/2018   Procedure: STENTING;  Surgeon: Luanne Bras, MD;  Location: [place];  Service: Radiology;  Laterality: N/A;  . RADIOLOGY WITH ANESTHESIA N/A 10/26/2018   Procedure: RADIOLOGY  WITH ANESTHESIA;  Surgeon: Luanne Bras, MD;  Location: [place];  Service: Radiology;  Laterality: N/A;  . TONSILLECTOMY    . TRANSTHORACIC ECHOCARDIOGRAM  08/2017; 04/2018:    A) EF 60-65%. Mild LVH. No RWMA. Gr 1 DD. Mod LA dilation.;  B)  EF 35-40%.  Severe LVH.  GRII DD.  Apical anteroseptal hypokinesis.  Akinesis of the apex.   04/2018:  -> PTA of ISR                                                                          Intervention                    MEDICATIONS/ALLERGIES   Current Meds  Medication Sig  . acetaminophen (TYLENOL) 325 MG tablet Take 2 tablets (650 mg total) by mouth every 6 (six) hours as needed for mild pain (or Fever >/= 101).  Marland Kitchen amLODipine (NORVASC) 10 MG tablet Take 1 tablet (10 mg total) by mouth every evening.  Marland Kitchen aspirin 81 MG chewable tablet Chew 1 tablet (81 mg total) by mouth daily.  . clopidogrel (PLAVIX) 75 MG tablet TAKE 1 TABLET BY MOUTH EVERY DAY  . doxazosin (CARDURA) 4 MG tablet Take 1 tablet (4 mg total) by mouth daily.  Marland Kitchen doxazosin (CARDURA) 4 MG tablet TAKE 1 TABLET BY MOUTH EVERY DAY  . Ferrous Sulfate 134 MG TABS Take by mouth.  . Ferrous Sulfate Dried (FERROUS SULFATE IRON) 200 (65 Fe) MG TABS Take 1 tablet by mouth every morning.   . furosemide (LASIX) 20 MG tablet Take 1 tablet (20 mg total) by mouth daily.  Marland Kitchen glimepiride (AMARYL) 4 MG tablet Take 1 tablet (4 mg total) by mouth daily with breakfast.  . isosorbide mononitrate (IMDUR) 60 MG 24 hr tablet Take 1 tablet (60 mg total) by mouth 2 (two) times daily.  . metFORMIN (GLUCOPHAGE) 1000 MG tablet Take 0.5 tablets (500 mg total) by mouth 2 (two) times daily.  Marland Kitchen  nitroGLYCERIN (NITROSTAT) 0.4 MG SL tablet Place under the tongue.  Marland Kitchen oxybutynin (DITROPAN) 5 MG tablet Take 5 mg by mouth daily as needed for bladder spasms.   . pantoprazole (PROTONIX) 40 MG tablet Take 40 mg by mouth daily.  . Vitamin D, Ergocalciferol, (DRISDOL) 1.25 MG (50000 UT) CAPS capsule TAKE 1 CAPSULE BY MOUTH EVERY  7 DAYS ON WEDNESDAY EVENING  . [DISCONTINUED] carvedilol (COREG) 6.25 MG tablet Take 1 tablet (6.25 mg total) by mouth 2 (two) times daily with a meal.  . [DISCONTINUED] furosemide (LASIX) 20 MG tablet Take 1 tablet (20 mg total) by mouth daily.  . [DISCONTINUED] isosorbide mononitrate (IMDUR) 60 MG 24 hr tablet Take 1 tablet (60 mg total) by mouth 2 (two) times daily.    Allergies  Allergen Reactions  . Atorvastatin Other (See Comments)    Myalgias  . Crestor [Rosuvastatin] Other (See Comments)    Myalgias   . Other Other (See Comments)    No  BLOOD PRODUCTS - Pt is Jehovah's Witness  . Pravastatin Other (See Comments)    Myalgias  . Simvastatin Other (See Comments)    Myalgias   . Statins Other (See Comments)    Cause muscle aches    SOCIAL HISTORY/FAMILY HISTORY   Reviewed in Epic:  Pertinent findings: no new changes.  Lives in [place], [place].  Working on getting new PCP.  OBJCTIVE -PE, EKG, labs   Wt Readings from Last 3 Encounters:  11/13/19 215 lb (97.5 kg)  04/03/19 216 lb 3.2 oz (98.1 kg)  03/27/19 215 lb 6.4 oz (97.7 kg)    Physical Exam: BP (!) 205/99   Pulse 63   Temp (!) 97.2 F (36.2 C)   Ht 6\' 1"  (1.854 m)   Wt 215 lb (97.5 kg)   SpO2 97%   BMI 28.37 kg/m  Physical Exam Constitutional:      General: He is not in acute distress.    Appearance: Normal appearance. He is normal weight.  HENT:     Head: Normocephalic and atraumatic.  Neck:     Vascular: No carotid bruit or JVD.  Cardiovascular:     Rate and Rhythm: Normal rate and regular rhythm. Occasional extrasystoles are present.    Chest Wall: PMI is not displaced.     Pulses: Decreased pulses.     Heart sounds: Murmur heard.  Harsh crescendo-decrescendo midsystolic murmur is present with a grade of 2/6 at the upper right sternal border radiating to the neck.  No friction rub. No gallop.   Pulmonary:     Effort: Pulmonary effort is normal. No respiratory distress.     Breath sounds: Normal  breath sounds.     Comments: No W/R/R Abdominal:     General: Abdomen is flat. There is no distension.     Palpations: Abdomen is soft.     Tenderness: There is no abdominal tenderness.     Comments: Mild truncal obesity  Musculoskeletal:        General: Deformity (Right hand has mild contractures secondary to paresthesia/paralysis) present.     Cervical back: Normal range of motion.  Neurological:     Mental Status: He is alert and oriented to person, place, and time.     Cranial Nerves: No cranial nerve deficit.     Comments: Only real residual from stroke is right lower arm and hand weakness/paresthesia     Adult ECG Report  Rate: 63 ;  Rhythm: normal sinus rhythm, premature ventricular contractions (PVC) and  Lateral MI, age-indeterminate.  Otherwise normal axis, intervals and durations;   Narrative Interpretation: Stable EKG  Recent Labs:   Should be due to have labs checked by PCP soon Lab Results  Component Value Date   CHOL 163 02/24/2019   HDL 41 02/24/2019   LDLCALC 103 (H) 02/24/2019   TRIG 104 02/24/2019   CHOLHDL 4.0 02/24/2019   Lab Results  Component Value Date   CREATININE 1.68 (H) 02/24/2019   BUN 29 (H) 02/24/2019   NA 139 02/24/2019   K 5.0 02/24/2019   CL 101 02/24/2019   CO2 24 02/24/2019   Lab Results  Component Value Date   TSH 0.609 05/26/2018    ASSESSMENT/PLAN    Problem List Items Addressed This Visit    History of: Non-STEMI (non-ST elevated myocardial infarction) (Chronic)   Relevant Medications   nitroGLYCERIN (NITROSTAT) 0.4 MG SL tablet   carvedilol (COREG) 6.25 MG tablet   hydrALAZINE (APRESOLINE) 25 MG tablet   furosemide (LASIX) 20 MG tablet   isosorbide mononitrate (IMDUR) 60 MG 24 hr tablet   Hyperlipidemia associated with type 2 diabetes mellitus (HCC) (Chronic)   Relevant Orders   Lipid panel   Hemoglobin A1c   CAD S/P percutaneous coronary angioplasty (Chronic)    History of multiple STEMI is a non-STEMI is with  extensive stent work in the SVG-PDA.  Remains on aspirin and Plavix with no bleeding issues.  Is on beta-blocker and Imdur. Not currently on statin.  Will need to reevaluate lipids.      Relevant Medications   nitroGLYCERIN (NITROSTAT) 0.4 MG SL tablet   carvedilol (COREG) 6.25 MG tablet   hydrALAZINE (APRESOLINE) 25 MG tablet   furosemide (LASIX) 20 MG tablet   isosorbide mononitrate (IMDUR) 60 MG 24 hr tablet   Other Relevant Orders   Comprehensive metabolic panel   Hemoglobin A1c   Coronary artery disease involving coronary bypass graft of native heart with angina pectoris (HCC) (Chronic)   Relevant Medications   nitroGLYCERIN (NITROSTAT) 0.4 MG SL tablet   carvedilol (COREG) 6.25 MG tablet   hydrALAZINE (APRESOLINE) 25 MG tablet   furosemide (LASIX) 20 MG tablet   isosorbide mononitrate (IMDUR) 60 MG 24 hr tablet   Other Relevant Orders   Comprehensive metabolic panel   Hemoglobin A1c   Essential hypertension (Chronic)   Relevant Medications   nitroGLYCERIN (NITROSTAT) 0.4 MG SL tablet   carvedilol (COREG) 6.25 MG tablet   hydrALAZINE (APRESOLINE) 25 MG tablet   furosemide (LASIX) 20 MG tablet   isosorbide mononitrate (IMDUR) 60 MG 24 hr tablet   Hyperlipidemia LDL goal <70 (Chronic)    Most recent lipids showed an LDL of 103.  Had been started on Zetia last visit.  Should be due for having labs checked soon.  We will check lipid panel and chemistry panel as well as A1c (he has not yet fully established with a primary care physician)      Relevant Medications   nitroGLYCERIN (NITROSTAT) 0.4 MG SL tablet   carvedilol (COREG) 6.25 MG tablet   hydrALAZINE (APRESOLINE) 25 MG tablet   furosemide (LASIX) 20 MG tablet   isosorbide mononitrate (IMDUR) 60 MG 24 hr tablet   S/P angioplasty with stent 03/26/16 to VG to PDA for in-stent restenosis with DES. (Chronic)   Relevant Orders   EKG 12-Lead (Completed)   Chronic diastolic CHF (congestive heart failure) (HCC) (Chronic)     Seems euvolemic on exam.  He does have intermittent edema, but well controlled.  He is on carvedilol as well as Imdur.  Blood pressure is quite high today and needs further titration of medicines.  Plan: Titrate carvedilol to 1/2 tablet twice daily and add hydralazine 25 mg twice daily.  (Intention would be hydralazine plus nitrate combination for afterload reduction in the absence of ARB/ACE inhibitor because of renal insufficiency) Continue furosemide, recommend that he does not take it as early in the morning.  (But not late in p.m.)      Relevant Medications   nitroGLYCERIN (NITROSTAT) 0.4 MG SL tablet   carvedilol (COREG) 6.25 MG tablet   hydrALAZINE (APRESOLINE) 25 MG tablet   furosemide (LASIX) 20 MG tablet   isosorbide mononitrate (IMDUR) 60 MG 24 hr tablet   Accelerated hypertension - Primary    Quite high blood pressure today which is unusual for him.  Plan: Take full additional dose of carvedilol tonight and then begin 1.5 tab twice daily starting tomorrow.  Also add hydralazine 25 mg twice daily. Continue for now furosemide and Imdur.   He is already on amlodipine 10 mg, and with renal insufficiency we are avoiding ARB/ACE inhibitor.      Relevant Medications   nitroGLYCERIN (NITROSTAT) 0.4 MG SL tablet   carvedilol (COREG) 6.25 MG tablet   hydrALAZINE (APRESOLINE) 25 MG tablet   furosemide (LASIX) 20 MG tablet   isosorbide mononitrate (IMDUR) 60 MG 24 hr tablet       COVID-19 Education: The signs and symptoms of COVID-19 were discussed with the patient and how to seek care for testing (follow up with PCP or arrange E-visit).   The importance of social distancing and COVID-19 vaccination was discussed today.  I spent a total of 22minutes with the patient. >  50% of the time was spent in direct patient consultation.  Additional time spent with chart review  / charting (studies, outside notes, etc): 8 Total Time: 29 min   Current medicines are reviewed at length with  the patient today.  (+/- concerns) none  Notice: This dictation was prepared with Dragon dictation along with smaller phrase technology. Any transcriptional errors that result from this process are unintentional and may not be corrected upon review.  Patient Instructions / Medication Changes & Studies & Tests Ordered   Patient Instructions  Medication Instructions:    Tonight   - take  2 tablets of Carvedilol(   Total 12.5 mg) And then starting tomorrow  Increase to 1 and 1/2 tablets ( Total 9.375 mg ) twice day   Start  Taking Hydralazine 25 mg twice a day-- take  Extra  Tablet  As needed Systolic  Greater than 696   *If you need a refill on your cardiac medications before your next appointment, please call your pharmacy*   Lab Work:  cmp Lipid Hgb A1c If you have labs (blood work) drawn today and your tests are completely normal, you will receive your results only by: Marland Kitchen MyChart Message (if you have MyChart) OR . A paper copy in the mail If you have any lab test that is abnormal or we need to change your treatment, we will call you to review the results.   Testing/Procedures: Not needed   Follow-Up: At [facility], you and your health needs are our priority.  As part of our continuing mission to provide you with exceptional heart care, we have created designated Provider Care Teams.  These Care Teams include your primary Cardiologist (physician) and Advanced Practice Providers (APPs -  Physician Assistants and Nurse  Practitioners) who all work together to provide you with the care you need, when you need it.  We recommend signing up for the patient portal called "MyChart".  Sign up information is provided on this After Visit Summary.  MyChart is used to connect with patients for Virtual Visits (Telemedicine).  Patients are able to view lab/test results, encounter notes, upcoming appointments, etc.  Non-urgent messages can be sent to your provider as well.   To learn more about  what you can do with MyChart, go to [email or web address].    Your next appointment:   4 month(s)  The format for your next appointment:   Either In Person or Virtual  Provider:   Glenetta Hew, MD   Other Instructions Your physician recommends that you schedule a follow-up appointment in 2 months with [facility]-- blood pressure      Studies Ordered:   Orders Placed This Encounter  Procedures  . Lipid panel  . Comprehensive metabolic panel  . Hemoglobin A1c  . EKG 12-Lead     Glenetta Hew, M.D., M.S. Interventional Cardiologist   Pager # [phone] Phone # [phone] [address] [place], [place] [address]   Thank you for choosing [facility] at [facility]!!

## 2019-11-20 ENCOUNTER — Encounter: Payer: Self-pay | Admitting: Cardiology

## 2019-11-20 DIAGNOSIS — I10 Essential (primary) hypertension: Secondary | ICD-10-CM | POA: Insufficient documentation

## 2019-11-20 NOTE — Assessment & Plan Note (Signed)
Most recent lipids showed an LDL of 103.  Had been started on Zetia last visit.  Should be due for having labs checked soon.  We will check lipid panel and chemistry panel as well as A1c (he has not yet fully established with a primary care physician)

## 2019-11-20 NOTE — Assessment & Plan Note (Addendum)
Quite high blood pressure today which is unusual for him.  Plan: Take full additional dose of carvedilol tonight and then begin 1.5 tab twice daily starting tomorrow.  Also add hydralazine 25 mg twice daily. Continue for now furosemide and Imdur.   He is already on amlodipine 10 mg, and with renal insufficiency we are avoiding ARB/ACE inhibitor.  Due for lab check. Follow-up blood pressure visit with CVRR in 2 months and then with me in 4 months.

## 2019-11-20 NOTE — Assessment & Plan Note (Addendum)
Seems euvolemic on exam.  He does have intermittent edema, but well controlled.  He is on carvedilol as well as Imdur.  Blood pressure is quite high today and needs further titration of medicines.  Plan: Titrate carvedilol to 1/2 tablet twice daily and add hydralazine 25 mg twice daily.  (Intention would be hydralazine plus nitrate combination for afterload reduction in the absence of ARB/ACE inhibitor because of renal insufficiency) Continue furosemide, recommend that he does not take it as early in the morning.  (But not late in p.m.)

## 2019-11-20 NOTE — Assessment & Plan Note (Signed)
History of multiple STEMI is a non-STEMI is with extensive stent work in the SVG-PDA.  Remains on aspirin and Plavix with no bleeding issues.  Is on beta-blocker and Imdur. Not currently on statin.  Will need to reevaluate lipids.

## 2019-12-18 ENCOUNTER — Other Ambulatory Visit: Payer: Self-pay | Admitting: Cardiology

## 2019-12-27 LAB — COMPREHENSIVE METABOLIC PANEL WITH GFR
ALT: 8 [IU]/L (ref 0–44)
AST: 14 [IU]/L (ref 0–40)
Albumin/Globulin Ratio: 2.1 (ref 1.2–2.2)
Albumin: 4.1 g/dL (ref 3.6–4.6)
Alkaline Phosphatase: 61 [IU]/L (ref 48–121)
BUN/Creatinine Ratio: 14 (ref 10–24)
BUN: 26 mg/dL (ref 8–27)
Bilirubin Total: 0.4 mg/dL (ref 0.0–1.2)
CO2: 23 mmol/L (ref 20–29)
Calcium: 9.1 mg/dL (ref 8.6–10.2)
Chloride: 105 mmol/L (ref 96–106)
Creatinine, Ser: 1.86 mg/dL — ABNORMAL HIGH (ref 0.76–1.27)
GFR calc Af Amer: 37 mL/min/{1.73_m2} — ABNORMAL LOW
GFR calc non Af Amer: 32 mL/min/{1.73_m2} — ABNORMAL LOW
Globulin, Total: 2 g/dL (ref 1.5–4.5)
Glucose: 173 mg/dL — ABNORMAL HIGH (ref 65–99)
Potassium: 5.3 mmol/L — ABNORMAL HIGH (ref 3.5–5.2)
Sodium: 142 mmol/L (ref 134–144)
Total Protein: 6.1 g/dL (ref 6.0–8.5)

## 2019-12-27 LAB — LIPID PANEL
Chol/HDL Ratio: 3.5 ratio (ref 0.0–5.0)
Cholesterol, Total: 138 mg/dL (ref 100–199)
HDL: 40 mg/dL
LDL Chol Calc (NIH): 76 mg/dL (ref 0–99)
Triglycerides: 122 mg/dL (ref 0–149)
VLDL Cholesterol Cal: 22 mg/dL (ref 5–40)

## 2019-12-27 LAB — HEMOGLOBIN A1C
Est. average glucose Bld gHb Est-mCnc: 174 mg/dL
Hgb A1c MFr Bld: 7.7 % — ABNORMAL HIGH (ref 4.8–5.6)

## 2020-01-02 ENCOUNTER — Ambulatory Visit (INDEPENDENT_AMBULATORY_CARE_PROVIDER_SITE_OTHER): Payer: Medicare Other | Admitting: Pharmacist Clinician (PhC)/ Clinical Pharmacy Specialist

## 2020-01-02 ENCOUNTER — Other Ambulatory Visit: Payer: Self-pay

## 2020-01-02 ENCOUNTER — Other Ambulatory Visit: Payer: Self-pay | Admitting: Pharmacist Clinician (PhC)/ Clinical Pharmacy Specialist

## 2020-01-02 DIAGNOSIS — I2510 Atherosclerotic heart disease of native coronary artery without angina pectoris: Secondary | ICD-10-CM

## 2020-01-02 DIAGNOSIS — I10 Essential (primary) hypertension: Secondary | ICD-10-CM | POA: Diagnosis not present

## 2020-01-02 DIAGNOSIS — Z9861 Coronary angioplasty status: Secondary | ICD-10-CM | POA: Diagnosis not present

## 2020-01-02 MED ORDER — EZETIMIBE 10 MG PO TABS: 10.0000 mg | ORAL_TABLET | Freq: Every day | ORAL | 3 refills | Status: DC

## 2020-01-02 NOTE — Patient Instructions (Signed)
Return for a a follow up appointment September 21 at 2 pm for you and 2:30 for [name]  Please have [name] take 81 mg of aspirin daily until that appointment.   Check your blood pressure at home once or twice daily and keep record of the readings.  Take your BP meds as follows:  Increase hydralazine to 2 tablets (50 mg) twice daily.     If you want to stop the pantoprazole, decrease to taking 1 tablet every other day for 2 weeks, then 1 tablet twice weekly (Monday and Friday) for another 2 weeks.  If at that time you don't feel any different (stomach pain, reflux, heartburn, etc...) then you can just stop using it altogether.    Bring all of your meds, your BP cuff and your record of home blood pressures to your next appointment.  Exercise as you're able, try to walk approximately 30 minutes per day.  Keep salt intake to a minimum, especially watch canned and prepared boxed foods.  Eat more fresh fruits and vegetables and fewer canned items.  Avoid eating in fast food restaurants.    HOW TO TAKE YOUR BLOOD PRESSURE: . Rest 5 minutes before taking your blood pressure. .  Don't smoke or drink caffeinated beverages for at least 30 minutes before. . Take your blood pressure before (not after) you eat. . Sit comfortably with your back supported and both feet on the floor (don't cross your legs). . Elevate your arm to heart level on a table or a desk. . Use the proper sized cuff. It should fit smoothly and snugly around your bare upper arm. There should be enough room to slip a fingertip under the cuff. The bottom edge of the cuff should be 1 inch above the crease of the elbow. . Ideally, take 3 measurements at one sitting and record the average.

## 2020-01-02 NOTE — Progress Notes (Signed)
01/03/2020 [name] 1930/12/17 [id number]   HPI:  [name] is a 86 y.o. male patient of [name], with a PMH below who presents today for hypertension clinic evaluation.  When he was seen by [name]. Ellyn [name] his BP was 205/99.  Carvedilol was increased and hydralazine was added.  Patient reports no problems with medication changes made at that time.  He does have a home BP cuff, and has checked it off and on since seeing [name]. Ellyn [name], but did not bring any of that information with him.   He points out that he has dealt with hypertension much of his life.  When he joined the [employer] as a young man he states he was told his pressure was high and almost was not able to enlist.    Today patient reports feeling well overall, but frustrated with the number of medications he takes on a daily basis.  Reviewed all medications with him, and will give instructions on tapering off pantoprazole, as he states no GI issues.  He reports that some BP readings are still > 170, although he has not taken any 3rd doses of hydralazine as was recommended by [name]. Ellyn [name].    Past Medical History: ASCVD NSTEMI 11/2010 with CABG x 2 (LIMA-> LAD, SVG -> PDA; NSTEMI 03/2016 occluded SVA, RCA stent;  acute CVA 10/2017;  PAD - relatively normal ABI's with tibial disease  hyperlipidemia 8/21:  TC 138, TG 122, HDL 40, LDL 76 - on ezetimibe 10 mg daily  DM2 8/21 A1c 7.7 - on glimepiride and metformin  CHF  Combined chronic systolic and diastolic - EF in 01/3789 24%  CKD Stage 3, GFR 32     Blood Pressure Goal:  150/80 - would prefer to slow taper him, patient not willing to treat aggressively  Current Medications: amlodipine 10 mg qd, doxazosin 4 mg qd, carvedilol 9.375 mg bid, hydralazine 25 mg bid (plus 3rd dose for systolic > 097)  Family Hx:  Father died at 34 from MI (first at 36); monther witout heart disease, died at 88; 5 brothers ; 1 daughter no heart issues/htn  Social Hx: no tobacco or alcohol  Diet: all  home cooked meals, nothing fried  Exercise: no regular exericse  Home BP readings: no readings with him today, recollects that systolic are running 353-299'M mostly  Intolerances: statin drugs caused myalgias.   Labs: 8/21:  Na 142, K 5.3, Glu 173, BUN 26, SCr 1.86, GFR 32  TC 138, TG 122, HDL 40, LDL 76  Wt Readings from Last 3 Encounters:  01/02/20 213 lb 9.6 oz (96.9 kg)  11/13/19 215 lb (97.5 kg)  04/03/19 216 lb 3.2 oz (98.1 kg)   BP Readings from Last 3 Encounters:  01/02/20 (!) 170/82  11/13/19 (!) 205/99  04/03/19 (!) 178/92   Pulse Readings from Last 3 Encounters:  01/02/20 71  11/13/19 63  04/03/19 (!) 50    Current Outpatient Medications  Medication Sig Dispense Refill  . acetaminophen (TYLENOL) 325 MG tablet Take 2 tablets (650 mg total) by mouth every 6 (six) hours as needed for mild pain (or Fever >/= 101).    Marland Kitchen amLODipine (NORVASC) 10 MG tablet Take 1 tablet (10 mg total) by mouth every evening. 90 tablet 3  . aspirin 81 MG chewable tablet Chew 1 tablet (81 mg total) by mouth daily.    . carvedilol (COREG) 6.25 MG tablet Take 1.5 tablets (9.375 mg total) by mouth 2 (two) times  daily. 270 tablet 3  . clopidogrel (PLAVIX) 75 MG tablet TAKE 1 TABLET BY MOUTH EVERY DAY 90 tablet 2  . doxazosin (CARDURA) 4 MG tablet Take 1 tablet (4 mg total) by mouth daily. 90 tablet 3  . Ferrous Sulfate 134 MG TABS Take by mouth.    . furosemide (LASIX) 20 MG tablet Take 1 tablet (20 mg total) by mouth daily. 90 tablet 3  . glimepiride (AMARYL) 4 MG tablet Take 1 tablet (4 mg total) by mouth daily with breakfast. 90 tablet 3  . isosorbide mononitrate (IMDUR) 60 MG 24 hr tablet Take 1 tablet (60 mg total) by mouth 2 (two) times daily. 180 tablet 3  . metFORMIN (GLUCOPHAGE) 1000 MG tablet Take 0.5 tablets (500 mg total) by mouth 2 (two) times daily. 180 tablet 3  . nitroGLYCERIN (NITROSTAT) 0.4 MG SL tablet Place under the tongue.    Marland Kitchen oxybutynin (DITROPAN) 5 MG tablet Take 5 mg by  mouth daily as needed for bladder spasms.     . pantoprazole (PROTONIX) 40 MG tablet Take 40 mg by mouth daily.    . Vitamin D, Ergocalciferol, (DRISDOL) 1.25 MG (50000 UT) CAPS capsule TAKE 1 CAPSULE BY MOUTH EVERY 7 DAYS ON WEDNESDAY EVENING 12 capsule 3  . ezetimibe (ZETIA) 10 MG tablet Take 1 tablet (10 mg total) by mouth daily. 90 tablet 3  . hydrALAZINE (APRESOLINE) 50 MG tablet Take 1 tablet (50 mg total) by mouth in the morning and at bedtime. 180 tablet 1   No current facility-administered medications for this visit.    Allergies  Allergen Reactions  . Atorvastatin Other (See Comments)    Myalgias  . Crestor [Rosuvastatin] Other (See Comments)    Myalgias   . Other Other (See Comments)    No  BLOOD PRODUCTS - Pt is Jehovah's Witness  . Pravastatin Other (See Comments)    Myalgias  . Simvastatin Other (See Comments)    Myalgias   . Statins Other (See Comments)    Cause muscle aches    Past Medical History:  Diagnosis Date  . Abdominal aortic aneurysm ([place])    a. Aortic duplex 06/2014: mild aneurysmal dilatation of proximal abdominal aorta measuring 3.4x3.4cm. No sig change from 2012. F/u due 06/2016;   . Acute blood loss anemia   . AKI (acute kidney injury) ([place]) 03/28/2016  . Anemia of chronic disease   . Apnea, sleep   . Arthritis    "right knee; never bothered me" (03/26/2016)  . 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 Bilateral lower extremity edema 03/30/2014  . Bradycardia 08/31/2013  . CAD in native artery    a. NSTEMI 11/2010 - CABG x2(LIMA to LAD, SVG to PDA). b. NEG Lexi MV 10/24/13, EF 53%, no perfusion abnormality, septal and apical HK noted. c. NSTEMI 05/2014 - s/p DES to SVG-RPDA 06/18/14 (Xience Alpine DES 3.0 x 18 mm -3.35 mm), EF 60-65; d. 03/2016 STEMI/PCI: LM nl, ost LAD 70%, mLCx 50%, pRCA 95% - mRCA 60%, dRPDA 70%, LIMA->LAD ok, o-p SVG->RPDA 100% (3.0x16 Promus DES overlapping prior stent).  . Carpal tunnel syndrome  of right wrist 06/24/2015  . Cerebral infarction due to stenosis of left middle cerebral artery ([place]) 01/22/2015  . Chronic anemia 04/17/2018  . Chronic combined systolic and diastolic CHF (congestive heart failure) ([place])   . Coronary artery disease involving coronary bypass graft of native heart with angina pectoris ([place]) 09/19/2014   Cath for Inf STEMI  03/27/16  Conclusions: Significant native coronary artery disease, including 70% ostial LAD stenosis, 50% mid LCx lesion, and 99% proximal RCA disease with TIMI-1 flow (chronic per prior cath reports). Patent LIMA to LAD. Acutely occluded SVG to PDA within previously placed stent. Successful PCI to proximal SVG to PDA in-stent restenosis/thrombosis with placement of a Promus Pre  . Coronary artery disease involving native coronary artery of native heart with angina pectoris ([place])    a. NSTEMI 11/2010 - CABG x2(LIMA to LAD, SVG to PDA-11/2010). b. NEGATIVE Lexiscan Myoview 6/9/'15, EF 53%, no perfusion abnormality, septal and apical HK noted. C. NSTEMI 05/2014 - s/p DES to SVG-RPDA 06/18/14, EF 60-65%; -> Last Cath 08/2017 - patent stents in SVG-rPDA.  . Diabetes mellitus treated with oral medication ([place]) 03/21/2015  . Diastolic dysfunction    a. 03/2016 Echo: EF 60-65%, no rwma, Gr1 DD, triv AI, Ao root 76mm, Asc Ao 70mm, triv MR.  . Dilated cardiomyopathy ([place]) 04/26/2018  . Dizziness    a. Carotid duplex 03/2014: mild fibrous plaque, no significant stenosis.  . Essential hypertension   . Foley catheter in place    "been wearing it for a couple months now" (03/26/2016)  . Hemispheric carotid artery syndrome 12/22/2016  . HTN (hypertension)   . Hyperlipidemia associated with type 2 diabetes mellitus ([place])   . Hypertensive crisis   . Left middle cerebral artery stroke ([place]) 05/27/2018  . Leukocytosis 04/17/2018  . Non-STEMI (non-ST elevated myocardial infarction) ([place]) 11/2010; 03/2018   1. Ostial LAD 70% (to close to [facility] for PCI), subtotal occlusion of the RCA;  2.  In-stent restenosis SVG-RCA -PTCA.  Marland Kitchen Peripheral arterial disease ([place]) 11/29/2018   Peripheral arterial disease  . Pneumonia    as a baby  . Polypharmacy 09/19/2014  . Poorly controlled type 2 diabetes mellitus with peripheral neuropathy ([place])   . Postoperative atrial fibrillation ([place]) 11/2010   Post CABG, no sign recurrence  . Refusal of blood transfusions as patient is Jehovah's Witness   . S/P CABG x 17 November 2010   LIMA-LAD, SVG to PDA ([name]. Servando Snare)  . Sleep apnea    Not on CPAP. (03/26/2016)  . ST elevation myocardial infarction (STEMI) of inferior wall ([place]) 04/05/2016   Occluded in-stent restenosis/thrombosis of SVG-RCA --> treated with overlapping Promus DES 3.0 mm 16 mm postdilated 3.0 mm).  . STEMI (ST elevation myocardial infarction) ([place]) 06/18/2014  . Stenosis of left carotid artery   . Stroke [facility]) 2014; 01/2015   a. 2014 with mild right hand weakness, nonhemorrhagic per pt.;; b. - PTA-Stent L ICA 95%   . Suprapubic catheter ([place])   . Vitamin D deficiency 05/27/2018    Blood pressure (!) 170/82, pulse 71, resp. rate 17, height 6\' 1"  (1.854 m), weight 213 lb 9.6 oz (96.9 kg), SpO2 98 %.  Accelerated hypertension Patient with longstanding hypertension, not currently well controlled.  He is hesitant to take any more medications, stating it's been this way his entire life.  I stressed that our goal is not to get < 997 systolic, but rather to get to about 150.   He seems much more comfortable with this idea and is therefore willing to increase hydralazine to 50 mg twice daily.  He would probably benefit from an ACEI/ARB, however with his GFR at 32, will not introduce these medications.  He will record home BP readings and return in a month for follow up.     Tommy Medal PharmD CPP [facility]  [address] [place], [place] [address] [phone]

## 2020-01-03 ENCOUNTER — Encounter: Payer: Self-pay | Admitting: Pharmacist Clinician (PhC)/ Clinical Pharmacy Specialist

## 2020-01-03 MED ORDER — HYDRALAZINE HCL 50 MG PO TABS: 50.0000 mg | ORAL_TABLET | Freq: Two times a day (BID) | ORAL | 1 refills | Status: DC

## 2020-01-03 NOTE — Assessment & Plan Note (Signed)
Patient with longstanding hypertension, not currently well controlled.  He is hesitant to take any more medications, stating it's been this way his entire life.  I stressed that our goal is not to get < 885 systolic, but rather to get to about 150.   He seems much more comfortable with this idea and is therefore willing to increase hydralazine to 50 mg twice daily.  He would probably benefit from an ACEI/ARB, however with his GFR at 32, will not introduce these medications.  He will record home BP readings and return in a month for follow up.

## 2020-01-15 ENCOUNTER — Ambulatory Visit: Payer: Medicare Other

## 2020-02-06 ENCOUNTER — Ambulatory Visit (INDEPENDENT_AMBULATORY_CARE_PROVIDER_SITE_OTHER): Payer: Medicare Other | Admitting: Pharmacist Clinician (PhC)/ Clinical Pharmacy Specialist

## 2020-02-06 ENCOUNTER — Other Ambulatory Visit: Payer: Self-pay

## 2020-02-06 DIAGNOSIS — I10 Essential (primary) hypertension: Secondary | ICD-10-CM | POA: Diagnosis not present

## 2020-02-06 DIAGNOSIS — I2510 Atherosclerotic heart disease of native coronary artery without angina pectoris: Secondary | ICD-10-CM | POA: Diagnosis not present

## 2020-02-06 DIAGNOSIS — Z9861 Coronary angioplasty status: Secondary | ICD-10-CM

## 2020-02-06 NOTE — Progress Notes (Signed)
02/06/2020 [name] [birth year] [id number]   HPI:  [name] is a 86 y.o. male patient of Dr Ellyn Hack, with a PMH below who presents today for hypertension clinic evaluation.  When he was seen by Dr. Ellyn Hack his BP was 205/99.  Carvedilol was increased and hydralazine was added.  Patient reports no problems with medication changes made at that time.  He does have a home BP cuff, and has checked it off and on since seeing Dr. Ellyn Hack, but did not bring any of that information with him.   He points out that he has dealt with hypertension much of his life.  When he joined the [employer] as a young man he states he was told his pressure was high and almost was not able to enlist.   Since his last visit he has been feeling fine, although home BP readings still mostly 140-160 range.  He does not like the idea of taking more medications and is even hesitant to take the extra 50 mg for systolic readings > 762.  This happened on 3 occasions since his last visit, however he never took the extra dose.     Past Medical History: ASCVD NSTEMI 11/2010 with CABG x 2 (LIMA-> LAD, SVG -> PDA; NSTEMI 03/2016 occluded SVA, RCA stent;  acute CVA 10/2017;  PAD - relatively normal ABI's with tibial disease  hyperlipidemia 8/21:  TC 138, TG 122, HDL 40, LDL 76 - on ezetimibe 10 mg daily  DM2 8/21 A1c 7.7 - on glimepiride and metformin  CHF  Combined chronic systolic and diastolic - EF in 12/3149 76%  CKD Stage 3, GFR 32     Blood Pressure Goal:  150/80 - would prefer to slow taper him, patient not willing to treat aggressively  Current Medications: amlodipine 10 mg qd, doxazosin 4 mg qd, carvedilol 9.375 mg bid, hydralazine 25 mg bid (plus 3rd dose for systolic > 160)  Family Hx:  Father died at 4 from MI (first at 66); monther witout heart disease, died at 100; 5 brothers ; 1 daughter no heart issues/htn  Social Hx: no tobacco or alcohol  Diet: all home cooked meals, nothing fried  Exercise: no regular  exercise, just activities of daily living (wife in wheelchair, pt does most of the housework)  Home BP readings: no readings with him today, recollects that systolic are running 737-106'Y mostly  Intolerances: statin drugs caused myalgias.   Labs: 8/21:  Na 142, K 5.3, Glu 173, BUN 26, SCr 1.86, GFR 32  TC 138, TG 122, HDL 40, LDL 76  Wt Readings from Last 3 Encounters:  02/06/20 209 lb 9.6 oz (95.1 kg)  01/02/20 213 lb 9.6 oz (96.9 kg)  11/13/19 215 lb (97.5 kg)   BP Readings from Last 3 Encounters:  02/06/20 (!) 152/80  01/02/20 (!) 170/82  11/13/19 (!) 205/99   Pulse Readings from Last 3 Encounters:  02/06/20 68  01/02/20 71  11/13/19 63    Current Outpatient Medications  Medication Sig Dispense Refill  . acetaminophen (TYLENOL) 325 MG tablet Take 2 tablets (650 mg total) by mouth every 6 (six) hours as needed for mild pain (or Fever >/= 101).    Marland Kitchen amLODipine (NORVASC) 10 MG tablet Take 1 tablet (10 mg total) by mouth every evening. 90 tablet 3  . aspirin 81 MG chewable tablet Chew 1 tablet (81 mg total) by mouth daily.    . carvedilol (COREG) 6.25 MG tablet Take 1.5 tablets (9.375  mg total) by mouth 2 (two) times daily. 270 tablet 3  . clopidogrel (PLAVIX) 75 MG tablet TAKE 1 TABLET BY MOUTH EVERY DAY 90 tablet 2  . doxazosin (CARDURA) 4 MG tablet Take 1 tablet (4 mg total) by mouth daily. 90 tablet 3  . ezetimibe (ZETIA) 10 MG tablet Take 1 tablet (10 mg total) by mouth daily. 90 tablet 3  . Ferrous Sulfate 134 MG TABS Take by mouth.    . furosemide (LASIX) 20 MG tablet Take 1 tablet (20 mg total) by mouth daily. 90 tablet 3  . glimepiride (AMARYL) 4 MG tablet Take 1 tablet (4 mg total) by mouth daily with breakfast. 90 tablet 3  . hydrALAZINE (APRESOLINE) 50 MG tablet Take 1 tablet (50 mg total) by mouth in the morning and at bedtime. 180 tablet 1  . isosorbide mononitrate (IMDUR) 60 MG 24 hr tablet Take 1 tablet (60 mg total) by mouth 2 (two) times daily. 180 tablet 3  .  metFORMIN (GLUCOPHAGE) 1000 MG tablet Take 0.5 tablets (500 mg total) by mouth 2 (two) times daily. 180 tablet 3  . nitroGLYCERIN (NITROSTAT) 0.4 MG SL tablet Place under the tongue.    Marland Kitchen oxybutynin (DITROPAN) 5 MG tablet Take 5 mg by mouth daily as needed for bladder spasms.     . pantoprazole (PROTONIX) 40 MG tablet Take 40 mg by mouth daily.    . Vitamin D, Ergocalciferol, (DRISDOL) 1.25 MG (50000 UT) CAPS capsule TAKE 1 CAPSULE BY MOUTH EVERY 7 DAYS ON WEDNESDAY EVENING 12 capsule 3   No current facility-administered medications for this visit.    Allergies  Allergen Reactions  . Atorvastatin Other (See Comments)    Myalgias  . Crestor [Rosuvastatin] Other (See Comments)    Myalgias   . Other Other (See Comments)    No  BLOOD PRODUCTS - Pt is Jehovah's Witness  . Pravastatin Other (See Comments)    Myalgias  . Simvastatin Other (See Comments)    Myalgias   . Statins Other (See Comments)    Cause muscle aches    Past Medical History:  Diagnosis Date  . Abdominal aortic aneurysm ([place])    a. Aortic duplex 06/2014: mild aneurysmal dilatation of proximal abdominal aorta measuring 3.4x3.4cm. No sig change from 2012. F/u due 06/2016;   . Acute blood loss anemia   . AKI (acute kidney injury) ([place]) 03/28/2016  . Anemia of chronic disease   . Apnea, sleep   . Arthritis    "right knee; never bothered me" (03/26/2016)  . 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 Bilateral lower extremity edema 03/30/2014  . Bradycardia 08/31/2013  . CAD in native artery    a. NSTEMI 11/2010 - CABG x2(LIMA to LAD, SVG to PDA). b. NEG Lexi MV 10/24/13, EF 53%, no perfusion abnormality, septal and apical HK noted. c. NSTEMI 05/2014 - s/p DES to SVG-RPDA 06/18/14 (Xience Alpine DES 3.0 x 18 mm -3.35 mm), EF 60-65; d. 03/2016 STEMI/PCI: LM nl, ost LAD 70%, mLCx 50%, pRCA 95% - mRCA 60%, dRPDA 70%, LIMA->LAD ok, o-p SVG->RPDA 100% (3.0x16 Promus DES overlapping prior stent).  .  Carpal tunnel syndrome of right wrist 06/24/2015  . Cerebral infarction due to stenosis of left middle cerebral artery ([place]) 01/22/2015  . Chronic anemia 04/17/2018  . Chronic combined systolic and diastolic CHF (congestive heart failure) ([place])   . Coronary artery disease involving coronary bypass graft of native heart with angina pectoris ([place])  09/19/2014   Cath for Inf STEMI 03/27/16  Conclusions: Significant native coronary artery disease, including 70% ostial LAD stenosis, 50% mid LCx lesion, and 99% proximal RCA disease with TIMI-1 flow (chronic per prior cath reports). Patent LIMA to LAD. Acutely occluded SVG to PDA within previously placed stent. Successful PCI to proximal SVG to PDA in-stent restenosis/thrombosis with placement of a Promus Pre  . Coronary artery disease involving native coronary artery of native heart with angina pectoris ([place])    a. NSTEMI 11/2010 - CABG x2(LIMA to LAD, SVG to PDA-11/2010). b. NEGATIVE Lexiscan Myoview 6/9/'15, EF 53%, no perfusion abnormality, septal and apical HK noted. C. NSTEMI 05/2014 - s/p DES to SVG-RPDA 06/18/14, EF 60-65%; -> Last Cath 08/2017 - patent stents in SVG-rPDA.  . Diabetes mellitus treated with oral medication ([place]) 03/21/2015  . Diastolic dysfunction    a. 03/2016 Echo: EF 60-65%, no rwma, Gr1 DD, triv AI, Ao root 67mm, Asc Ao 34mm, triv MR.  . Dilated cardiomyopathy ([place]) 04/26/2018  . Dizziness    a. Carotid duplex 03/2014: mild fibrous plaque, no significant stenosis.  . Essential hypertension   . Foley catheter in place    "been wearing it for a couple months now" (03/26/2016)  . Hemispheric carotid artery syndrome 12/22/2016  . HTN (hypertension)   . Hyperlipidemia associated with type 2 diabetes mellitus ([place])   . Hypertensive crisis   . Left middle cerebral artery stroke ([place]) 05/27/2018  . Leukocytosis 04/17/2018  . Non-STEMI (non-ST elevated myocardial infarction) ([place]) 11/2010; 03/2018   1. Ostial LAD 70% (to close to [facility] for PCI),  subtotal occlusion of the RCA; 2.  In-stent restenosis SVG-RCA -PTCA.  Marland Kitchen Peripheral arterial disease ([place]) 11/29/2018   Peripheral arterial disease  . Pneumonia    as a baby  . Polypharmacy 09/19/2014  . Poorly controlled type 2 diabetes mellitus with peripheral neuropathy ([place])   . Postoperative atrial fibrillation ([place]) 11/2010   Post CABG, no sign recurrence  . Refusal of blood transfusions as patient is Jehovah's Witness   . 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 Servando Snare)  . Sleep apnea    Not on CPAP. (03/26/2016)  . ST elevation myocardial infarction (STEMI) of inferior wall ([place]) 04/05/2016   Occluded in-stent restenosis/thrombosis of SVG-RCA --> treated with overlapping Promus DES 3.0 mm 16 mm postdilated 3.0 mm).  . STEMI (ST elevation myocardial infarction) ([place]) 06/18/2014  . Stenosis of left carotid artery   . Stroke [facility]) 2014; 01/2015   a. 2014 with mild right hand weakness, nonhemorrhagic per pt.;; b. - PTA-Stent L ICA 95%   . Suprapubic catheter ([place])   . Vitamin D deficiency 05/27/2018    Blood pressure (!) 152/80, pulse 68, temperature 98 F (36.7 C), temperature source Tympanic, resp. rate 15, height 6\' 1"  (1.854 m), weight 209 lb 9.6 oz (95.1 kg), SpO2 97 %.  Essential hypertension Patient with essential hypertension, blood pressure in the office today continues to be elevated, at 152/80.  Home readings range 063-016 systolic with 3 readings in the past month > 160.  He is hesitant about adding medications in, feels that he takes enough pills as it is.  Advised that we will continue with his current regimen, but would appreciate if he would take the extra hydralazine 50 mg tablet when systolic > 010 to help avoid future high readings.   Patient agrees to be more aware of elevated readings and take extra dose when needed.  Tommy Medal PharmD CPP [facility] [address] [place], [place] [address] [phone]

## 2020-02-06 NOTE — Assessment & Plan Note (Signed)
Patient with essential hypertension, blood pressure in the office today continues to be elevated, at 152/80.  Home readings range 798-921 systolic with 3 readings in the past month > 160.  He is hesitant about adding medications in, feels that he takes enough pills as it is.  Advised that we will continue with his current regimen, but would appreciate if he would take the extra hydralazine 50 mg tablet when systolic > 194 to help avoid future high readings.   Patient agrees to be more aware of elevated readings and take extra dose when needed.

## 2020-02-06 NOTE — Patient Instructions (Signed)
  Check your blood pressure at home 2-3 times each week and keep record of the readings.  Take your BP meds as follows:  Continue with current medications.  Please use the extra hydralazine dose if your systolic pressure (top number) is > 160.    Call if you have any concerns or questions.  Nikolaj Geraghty/Raqeul at [phone]  Bring all of your meds, your BP cuff and your record of home blood pressures to your next appointment.  Exercise as you're able, try to walk approximately 30 minutes per day.  Keep salt intake to a minimum, especially watch canned and prepared boxed foods.  Eat more fresh fruits and vegetables and fewer canned items.  Avoid eating in fast food restaurants.    HOW TO TAKE YOUR BLOOD PRESSURE: . Rest 5 minutes before taking your blood pressure. .  Don't smoke or drink caffeinated beverages for at least 30 minutes before. . Take your blood pressure before (not after) you eat. . Sit comfortably with your back supported and both feet on the floor (don't cross your legs). . Elevate your arm to heart level on a table or a desk. . Use the proper sized cuff. It should fit smoothly and snugly around your bare upper arm. There should be enough room to slip a fingertip under the cuff. The bottom edge of the cuff should be 1 inch above the crease of the elbow. . Ideally, take 3 measurements at one sitting and record the average.

## 2020-02-13 ENCOUNTER — Other Ambulatory Visit: Payer: Self-pay | Admitting: Adult Health

## 2020-03-15 ENCOUNTER — Encounter: Payer: Self-pay | Admitting: Cardiology

## 2020-03-15 ENCOUNTER — Telehealth (INDEPENDENT_AMBULATORY_CARE_PROVIDER_SITE_OTHER): Payer: Medicare Other | Admitting: Cardiology

## 2020-03-15 VITALS — BP 160/82 | Ht 73.0 in | Wt 200.0 lb

## 2020-03-15 DIAGNOSIS — R600 Localized edema: Secondary | ICD-10-CM

## 2020-03-15 DIAGNOSIS — E1169 Type 2 diabetes mellitus with other specified complication: Secondary | ICD-10-CM

## 2020-03-15 DIAGNOSIS — Z9861 Coronary angioplasty status: Secondary | ICD-10-CM | POA: Diagnosis not present

## 2020-03-15 DIAGNOSIS — I1A0 Resistant hypertension: Secondary | ICD-10-CM

## 2020-03-15 DIAGNOSIS — I110 Hypertensive heart disease with heart failure: Secondary | ICD-10-CM

## 2020-03-15 DIAGNOSIS — Z951 Presence of aortocoronary bypass graft: Secondary | ICD-10-CM

## 2020-03-15 DIAGNOSIS — I5030 Unspecified diastolic (congestive) heart failure: Secondary | ICD-10-CM

## 2020-03-15 DIAGNOSIS — I63512 Cerebral infarction due to unspecified occlusion or stenosis of left middle cerebral artery: Secondary | ICD-10-CM

## 2020-03-15 DIAGNOSIS — E118 Type 2 diabetes mellitus with unspecified complications: Secondary | ICD-10-CM

## 2020-03-15 DIAGNOSIS — I2510 Atherosclerotic heart disease of native coronary artery without angina pectoris: Secondary | ICD-10-CM | POA: Diagnosis not present

## 2020-03-15 DIAGNOSIS — N1832 Chronic kidney disease, stage 3b: Secondary | ICD-10-CM

## 2020-03-15 DIAGNOSIS — I25709 Atherosclerosis of coronary artery bypass graft(s), unspecified, with unspecified angina pectoris: Secondary | ICD-10-CM | POA: Diagnosis not present

## 2020-03-15 DIAGNOSIS — I10 Essential (primary) hypertension: Secondary | ICD-10-CM | POA: Diagnosis not present

## 2020-03-15 DIAGNOSIS — E785 Hyperlipidemia, unspecified: Secondary | ICD-10-CM

## 2020-03-15 MED ORDER — CARVEDILOL 6.25 MG PO TABS: ORAL_TABLET | ORAL | 3 refills | Status: DC

## 2020-03-15 NOTE — Progress Notes (Signed)
Virtual Visit via Telephone Note   This visit type was conducted due to national recommendations for restrictions regarding the COVID-19 Pandemic (e.g. social distancing) in an effort to limit this patient's exposure and mitigate transmission in our community.  Due to his co-morbid illnesses, this patient is at least at moderate risk for complications without adequate follow up.  This format is felt to be most appropriate for this patient at this time.  The patient did not have access to video technology/had technical difficulties with video requiring transitioning to audio format only (telephone).  All issues noted in this document were discussed and addressed.  No physical exam could be performed with this format.  Please refer to the patient's chart for his  consent to telehealth for [facility].   Patient has given verbal permission to conduct this visit via virtual appointment and to bill insurance 03/15/2020 5:30 PM     Evaluation Performed:  Follow-up visit  Date:  03/15/2020   ID:  [name], DOB August 12, 1930, MRN [id number]  Patient Location: Home Provider Location: Office/Clinic  PCP:  Suzan Garibaldi, FNP  Cardiologist:  Glenetta Hew, MD  Electrophysiologist:  None   Chief Complaint:   Chief Complaint  Patient presents with  . Follow-up    Doing well  . Hypertension    BP has been pretty stable - unusually high this PM  . Coronary Artery Disease    no CP    History of Present Illness:    [name] is a 86 y.o. male with PMH notable for CAD-CABG& PCI, AAA, CVA & ACCELERATED HTN (Resistant HTN) who presents via audio/video conferencing for a telehealth visit today.   CAD: Non-STEMI July 2012-CABG x2 (LIMA-LAD, SVG-PDA) ? 2016 PCI to SVG-PDA ? STEMI November 2017 -> occluded SVG-RCA stent-overlapping Promus DES ? Multiple PCI's SVG-PDA -> most recently in December 20 19-70% ISR of the ostial stent treated with PTA.  AAA followed by vascular surgery  June  2019 stroke: Status post carotid PTA   LDA Dopplers show relatively normal ABIs with tibial disease.   [name] was last seen by me on November 13, 2019-his blood pressure was 205/99.  Noted having on and off days with occasional chest comfort spells relieved with Tylenol as opposed to nitroglycerin.  Occasional skipped beats but nothing prolonged.  Activity limited by knee pain.  Also having issues with his urinary catheter.  More fatigued than usual but no recurrent stroke symptoms.  Exertional dyspnea noted along with stable edema..  Significant right knee pain and consistent right arm/hand paresthesia/paralysis -> I had him take his Coreg 9.375 mg twice daily and start hydralazine 25 mg twice daily.  He was referred to [place] where he was seen first on August 17.  Hydralazine was increased to 50 mg twice daily.  They agreed on target range to be in the 176H systolic.  ARB/ACE inhibitor probably not a great idea with GFR of 32.  Hospitalizations:  . 2 visits in the ER for issues with his urinary catheter and suprapubic catheter obstruction in August.  He was actually last seen by Tommy Medal, RPH- CCP with our [facility].  Interestingly, his blood pressure at that time was 152/80 mmHg with only 3 readings in the last month of greater than 160.  The plan was to continue current regimen and use as needed hydralazine 50 mg for pressures greater than 160.   Recent - Interim CV studies:  The following studies were reviewed today: . None  Inerval History   [name] is being seen today via telemedicine for hypertension follow-up. - Recheck BP this AM much better & in his normal range. (~160/80 mmHg).  R hand still loss of dexterity - more "dead" despite continued exercise & use.  Hand less functional.  Otherwise, he is doing quite well from a cardiac standpoint. No major issues.  Cardiovascular ROS: no chest  pain or dyspnea on exertion positive for - He had swelling going on up till about 2 months ago when he changes his diet.  This got much better. negative for - irregular heartbeat, orthopnea, palpitations, paroxysmal nocturnal dyspnea, rapid heart rate, shortness of breath or Lightheadedness or dizziness, syncope/near syncope or TIA/amaurosis fugax, claudication.   ROS:  Please see the history of present illness.    The patient does not have symptoms concerning for COVID-19 infection (fever, chills, cough, or new shortness of breath).  Review of Systems  Constitutional: Negative for malaise/fatigue and weight loss.  HENT: Negative for congestion and nosebleeds.   Eyes: Negative for double vision.  Gastrointestinal: Negative for blood in stool and melena.  Genitourinary: Negative for hematuria.       He has switched back from a urologist in [place] to Dr. Diona Fanti in [place] after having 2 suprapubic catheters get obstructed-having apparently the size of the catheter that that urologist useded was smaller than the one Dr. Diona Fanti uses. No problems since switching back.  Musculoskeletal: Positive for joint pain (Knees). Negative for falls.  Neurological: Positive for focal weakness (Right hand dexterity is getting worse.). Negative for dizziness, seizures and headaches.  Psychiatric/Behavioral: Negative for memory loss. The patient does not have insomnia.     The patient is practicing social distancing.  Past Medical History:  Diagnosis Date  . Abdominal aortic aneurysm ([place])    a. Aortic duplex 06/2014: mild aneurysmal dilatation of proximal abdominal aorta measuring 3.4x3.4cm. No sig change from 2012. F/u due 06/2016;   . Acute blood loss anemia   . AKI (acute kidney injury) ([place]) 03/28/2016  . Anemia of chronic disease   . Apnea, sleep   . Arthritis    "right knee; never bothered me" (03/26/2016)  . 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  Bilateral lower extremity edema 03/30/2014  . Bradycardia 08/31/2013  . CAD in native artery    a. NSTEMI 11/2010 - CABG x2(LIMA to LAD, SVG to PDA). b. NEG Lexi MV 10/24/13, EF 53%, no perfusion abnormality, septal and apical HK noted. c. NSTEMI 05/2014 - s/p DES to SVG-RPDA 06/18/14 (Xience Alpine DES 3.0 x 18 mm -3.35 mm), EF 60-65; d. 03/2016 STEMI/PCI: LM nl, ost LAD 70%, mLCx 50%, pRCA 95% - mRCA 60%, dRPDA 70%, LIMA->LAD ok, o-p SVG->RPDA 100% (3.0x16 Promus DES overlapping prior stent).  . Carpal tunnel syndrome of right wrist 06/24/2015  . Cerebral infarction due to stenosis of left middle cerebral artery ([place]) 01/22/2015  . Chronic anemia 04/17/2018  . Chronic combined systolic and diastolic CHF (congestive heart failure) ([place])   . Coronary artery disease involving coronary bypass graft of native heart with angina pectoris ([place]) 09/19/2014   Cath for Inf STEMI 03/27/16  Conclusions: Significant native coronary artery disease, including 70% ostial LAD stenosis, 50% mid LCx lesion, and 99% proximal RCA disease with TIMI-1 flow (chronic per prior cath reports). Patent LIMA to LAD. Acutely occluded SVG to PDA within previously placed stent. Successful PCI  to proximal SVG to PDA in-stent restenosis/thrombosis with placement of a Promus Pre  . Coronary artery disease involving native coronary artery of native heart with angina pectoris ([place])    a. NSTEMI 11/2010 - CABG x2(LIMA to LAD, SVG to PDA-11/2010). b. NEGATIVE Lexiscan Myoview 6/9/'15, EF 53%, no perfusion abnormality, septal and apical HK noted. C. NSTEMI 05/2014 - s/p DES to SVG-RPDA 06/18/14, EF 60-65%; -> Last Cath 08/2017 - patent stents in SVG-rPDA.  . Diabetes mellitus treated with oral medication ([place]) 03/21/2015  . Diastolic dysfunction    a. 03/2016 Echo: EF 60-65%, no rwma, Gr1 DD, triv AI, Ao root 74mm, Asc Ao 90mm, triv MR.  . Dilated cardiomyopathy ([place]) 04/26/2018  . Dizziness    a. Carotid duplex 03/2014: mild fibrous plaque, no significant  stenosis.  . Essential hypertension   . Foley catheter in place    "been wearing it for a couple months now" (03/26/2016)  . Hemispheric carotid artery syndrome 12/22/2016  . HTN (hypertension)   . Hyperlipidemia associated with type 2 diabetes mellitus ([place])   . Hypertensive crisis   . Left middle cerebral artery stroke ([place]) 05/27/2018  . Leukocytosis 04/17/2018  . Non-STEMI (non-ST elevated myocardial infarction) ([place]) 11/2010; 03/2018   1. Ostial LAD 70% (to close to [facility] for PCI), subtotal occlusion of the RCA; 2.  In-stent restenosis SVG-RCA -PTCA.  Marland Kitchen Peripheral arterial disease ([place]) 11/29/2018   Peripheral arterial disease  . Pneumonia    as a baby  . Polypharmacy 09/19/2014  . Poorly controlled type 2 diabetes mellitus with peripheral neuropathy ([place])   . Postoperative atrial fibrillation ([place]) 11/2010   Post CABG, no sign recurrence  . Refusal of blood transfusions as patient is Jehovah's Witness   . 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 Servando Snare)  . Sleep apnea    Not on CPAP. (03/26/2016)  . ST elevation myocardial infarction (STEMI) of inferior wall ([place]) 04/05/2016   Occluded in-stent restenosis/thrombosis of SVG-RCA --> treated with overlapping Promus DES 3.0 mm 16 mm postdilated 3.0 mm).  . STEMI (ST elevation myocardial infarction) ([place]) 06/18/2014  . Stenosis of left carotid artery   . Stroke [facility]) 2014; 01/2015   a. 2014 with mild right hand weakness, nonhemorrhagic per pt.;; b. - PTA-Stent L ICA 95%   . Suprapubic catheter ([place])   . Vitamin D deficiency 05/27/2018    There is no immunization history on file for this patient.  Vaccines done in July - planning to get booster (done @ [facility])  Past Surgical History:  Procedure Laterality Date  . APPENDECTOMY    . CARDIAC CATHETERIZATION  12/11/2010   Dr. Chase Picket - subsequent cath - normal LV systolic function, no renal artery stenosis, severe 2-vessel disease with subtotaled RCA prox and distal 60%  lesion and complex 70% area of narrowing of ostium of LAD  . CARDIAC CATHETERIZATION N/A 03/26/2016   Procedure: Left Heart Cath and Coronary Angiography;  Surgeon: Nelva Bush, MD;  Location: [place] CV [facility];  Service: Cardiovascular;  Laterality: N/A;  . CARDIAC CATHETERIZATION N/A 03/26/2016   Procedure: Coronary Stent Intervention;  Surgeon: Nelva Bush, MD;  Location: [place] [facility];  Service: Cardiovascular: 100% In-stent thrombosis of pros SVG-RCA (Xience DES) --> treated with PromusDES 3.0 x 18 (3.3 mm)  . CARDIAC CATHETERIZATION N/A 03/26/2016   Procedure: Bypass Graft Angiography;  Surgeon: Nelva Bush, MD;  Location: [place] CV [facility];  Service: Cardiovascular;  Laterality: N/A;  .  CORONARY ANGIOPLASTY WITH STENT PLACEMENT  06/18/2014   PCI to SVG-RPDA 06/18/14 (Xience Alpine DES 3.0 x 18 mm -3.35 mm),   . CORONARY ARTERY BYPASS GRAFT  12/15/2010   X2, Dr Servando Snare; LIMA to LAD, SVG to PDA;   . CORONARY BALLOON ANGIOPLASTY N/A 04/18/2018   Procedure: CORONARY BALLOON ANGIOPLASTY;  Surgeon: Leonie Man, MD;  Location: [place] [facility];  Service: Cardiovascular;;; high pressure scoring and noncompliant balloon PTCA of SVG-RCA ISR ostial and proximal  . CORONARY/GRAFT ANGIOGRAPHY N/A 08/27/2017   Procedure: CORONARY/GRAFT ANGIOGRAPHY;  Surgeon: Nelva Bush, MD;  Location: [place] [facility];  Service: Cardiovascular;; pLAD 70%, ostD1 50%.  mCx 60%, OM2 80%. pRCA 95% & mRCA 100% - rPDA 70%. LIMA-mLAD patent. SVG-rPDA 10% ISR.   Marland Kitchen CYSTOSCOPY WITH URETHRAL DILATATION    . IR 3D INDEPENDENT WKST  10/26/2018  . IR ANGIO INTRA EXTRACRAN SEL COM CAROTID INNOMINATE BILAT MOD SED  02/25/2017  . IR ANGIO INTRA EXTRACRAN SEL COM CAROTID INNOMINATE BILAT MOD SED  10/19/2017   Dr. Kathee Delton: L Common Carotid - ECA & major branches widely patent. ICA ~20% distal to bulb & 50% in supraclinoid segment. LMCA-distal 1/3 MI ~905 stenosis with post-stenotic dilation into inferior division.  ~50% prox Basilar A stenosis @ anterior Inf Cerebellar A. 50% R ICA  . IR ANGIO INTRA EXTRACRAN SEL COM CAROTID INNOMINATE UNI L MOD SED  06/06/2018  . IR ANGIO INTRA EXTRACRAN SEL COM CAROTID INNOMINATE UNI L MOD SED  10/26/2018  . IR ANGIO VERTEBRAL SEL SUBCLAVIAN INNOMINATE UNI L MOD SED  02/25/2017  . IR ANGIO VERTEBRAL SEL SUBCLAVIAN INNOMINATE UNI R MOD SED  10/19/2017  . IR ANGIO VERTEBRAL SEL VERTEBRAL UNI L MOD SED  10/19/2017  . IR GENERIC HISTORICAL  01/21/2016   IR RADIOLOGIST EVAL & MGMT 01/21/2016 [facility]  . IR GENERIC HISTORICAL  02/03/2016   IR CATHETER TUBE CHANGE 02/03/2016 Marybelle Killings, MD [facility]-INTERV RAD  . IR RADIOLOGIST EVAL & MGMT  11/09/2017  . LEFT HEART CATH AND CORONARY ANGIOGRAPHY N/A 04/18/2018   Procedure: LEFT HEART CATH AND CORONARY ANGIOGRAPHY;  Surgeon: Leonie Man, MD;  Location: [place] [facility];  Service: Cardiovascular;  Laterality: N/A; stable findings on last cath with exception of 75% in-stent restenosis of SVG-RCA ostial stent treated with PTCA.    Marland Kitchen LEFT HEART CATHETERIZATION WITH CORONARY ANGIOGRAM N/A 06/18/2014   Procedure: LEFT HEART CATHETERIZATION WITH CORONARY ANGIOGRAM;  Surgeon: Leonie Man, MD;  Location: [facility] CATH LAB;  Service: Cardiovascular;  -- severe disease of SVG-rPDA  . NO PAST SURGERIES    . RADIOLOGY WITH ANESTHESIA N/A 01/24/2015   Procedure: STENT ASSISTED ANGIOPLASTY (RADIOLOGY WITH ANESTHESIA);  Surgeon: Luanne Bras, MD;  Location: [place];  Service: Radiology;  Laterality: N/A;  . RADIOLOGY WITH ANESTHESIA N/A 06/06/2018   Procedure: STENTING;  Surgeon: Luanne Bras, MD;  Location: [place];  Service: Radiology;  Laterality: N/A;  . RADIOLOGY WITH ANESTHESIA N/A 10/26/2018   Procedure: RADIOLOGY WITH ANESTHESIA;  Surgeon: Luanne Bras, MD;  Location: [place];  Service: Radiology;  Laterality: N/A;  . TONSILLECTOMY    . TRANSTHORACIC ECHOCARDIOGRAM  08/2017; 04/2018:    A) EF 60-65%. Mild LVH. No RWMA. Gr 1 DD. Mod LA  dilation.;  B)  EF 35-40%.  Severe LVH.  GRII DD.  Apical anteroseptal hypokinesis.  Akinesis of the apex.    04/2018:  -> PTA of ISRIntervention     Current Meds  Medication Sig  .  acetaminophen (TYLENOL) 325 MG tablet Take 2 tablets (650 mg total) by mouth every 6 (six) hours as needed for mild pain (or Fever >/= 101).  Marland Kitchen amLODipine (NORVASC) 10 MG tablet TAKE 1 TABLET BY MOUTH EVERY DAY IN THE EVENING  . aspirin 81 MG chewable tablet Chew 1 tablet (81 mg total) by mouth daily.  . carvedilol (COREG) 6.25 MG tablet Take 1.5 tablets (9.375 mg total) by mouth every morning AND 2 tablets (12.5 mg total) every evening.  . clopidogrel (PLAVIX) 75 MG tablet TAKE 1 TABLET BY MOUTH EVERY DAY  . doxazosin (CARDURA) 4 MG tablet Take 1 tablet (4 mg total) by mouth daily.  Marland Kitchen ezetimibe (ZETIA) 10 MG tablet Take 1 tablet (10 mg total) by mouth daily.  . Ferrous Sulfate 134 MG TABS Take by mouth.  . furosemide (LASIX) 20 MG tablet Take 1 tablet (20 mg total) by mouth daily.  Marland Kitchen glimepiride (AMARYL) 4 MG tablet TAKE 1 TABLET BY MOUTH DAILY WITH BREAKFAST  . hydrALAZINE (APRESOLINE) 50 MG tablet Take 1 tablet (50 mg total) by mouth in the morning and at bedtime.  . isosorbide mononitrate (IMDUR) 60 MG 24 hr tablet Take 1 tablet (60 mg total) by mouth 2 (two) times daily.  . metFORMIN (GLUCOPHAGE) 1000 MG tablet Take 0.5 tablets (500 mg total) by mouth 2 (two) times daily.  . nitroGLYCERIN (NITROSTAT) 0.4 MG SL tablet Place under the tongue.  Marland Kitchen oxybutynin (DITROPAN) 5 MG tablet Take 5 mg by mouth daily as needed for bladder spasms.   . pantoprazole (PROTONIX) 40 MG tablet Take 40 mg by mouth daily.  . Vitamin D, Ergocalciferol, (DRISDOL) 1.25 MG (50000 UT) CAPS capsule TAKE 1 CAPSULE BY MOUTH EVERY 7 DAYS ON WEDNESDAY EVENING  . [DISCONTINUED] carvedilol (COREG) 6.25 MG tablet Take 1.5 tablets (9.375 mg total) by mouth 2  (two) times daily.     Allergies:   Atorvastatin, Crestor [rosuvastatin], Other, Pravastatin, Simvastatin, and Statins   Social History   Tobacco Use  . Smoking status: Former Smoker    Packs/day: 0.33    Years: 10.00    Pack years: 3.30    Types: Cigarettes    Quit date: 1961    Years since quitting: 60.8  . Smokeless tobacco: Never Used  Vaping Use  . Vaping Use: Never used  Substance Use Topics  . Alcohol use: No  . Drug use: No     Family Hx: The patient's family history includes Heart Problems (age of onset: 96) in his father.   Labs/Other Tests and Data Reviewed:    EKG:  No ECG reviewed.  Recent Labs: 12/26/2019: ALT 8; BUN 26; Creatinine, Ser 1.86; Potassium 5.3; Sodium 142  Lab Results  Component Value Date   HGBA1C 7.7 (H) 12/26/2019    Recent Lipid Panel Lab Results  Component Value Date/Time   CHOL 138 12/26/2019 02:07 PM   TRIG 122 12/26/2019 02:07 PM   HDL 40 12/26/2019 02:07 PM   CHOLHDL 3.5 12/26/2019 02:07 PM   CHOLHDL 3.5 04/18/2018 01:36 AM   LDLCALC 76 12/26/2019 02:07 PM    Wt Readings from Last 3 Encounters:  03/15/20 200 lb (90.7 kg)  02/06/20 209 lb 9.6 oz (95.1 kg)  01/02/20 213 lb 9.6 oz (96.9 kg)     Objective:    Vital Signs:  BP (!) 160/82   Ht 6\' 1"  (1.854 m)   Wt 200 lb (90.7 kg)   BMI 26.39 kg/m   - recheck 160/80 mmHg  VITAL SIGNS:  reviewed Pleasant elderly gentleman in no acute distress. A&O x 3.  Normal Mood & Affect Non-labored respirations  ASSESSMENT & PLAN:    Problem List Items Addressed This Visit    Resistant hypertension (Chronic)    Essentially (accelerated) resistant hypertension on multiple medications including standing dose of diuretic (Lasix) hydralazine plus Imdur, amlodipine and carvedilol.  With borderline GFR, not avoiding ACE inhibitor/ARB. Also given his age and tendency for static hypotension we are wanting aggressive BP control. Target blood pressure range for him is an average SBP of 150  mmHg.  His blood pressure this morning was quite high for him. He says usually it is more like a second reading is 160/80. Because the elevated pressures in the mornings, will increase nighttime dose of carvedilol to 12.5 mg. Continue with 9.375 mg in the a.m.  Otherwise as needed hydralazine 50 mg for sustained blood pressure > 160 mmHg      Relevant Medications   carvedilol (COREG) 6.25 MG tablet   Benign hypertensive heart disease with diastolic CHF, NYHA class 2 (HCC) (Chronic)    No active CHF symptoms. He is on significant blood pressure medications, but has no PND, orthopnea and the edema is well controlled on current dose of furosemide.  He understands that he is okay to take additional dose of furosemide for worsening edema, orthopnea or DOE.      Relevant Medications   carvedilol (COREG) 6.25 MG tablet   Left middle cerebral artery stroke (HCC) (Chronic)    He still has residual essentially paralysis of the right hand. He is able to use the hand some, but has significantly decreased function.  Continue blood pressure control along lipid and glycemic control. Continue DAPT      Relevant Medications   carvedilol (COREG) 6.25 MG tablet   Hyperlipidemia associated with type 2 diabetes mellitus (HCC) (Chronic)    Most recent lipids show LDL is 76 which is not back to 70s on Zetia with statin intolerance.  He is reluctant to take any additional medications. Would therefore hold off on adding Nexletol.      CAD S/P percutaneous coronary angioplasty - Primary (Chronic)    Last PCI was in January 2016 with a DES to the SVG-PDA. Last cath April 2019 with patent stents.   He has on DAPT with aspirin and Plavix (for combination of CAD, PAD and carotid disease)  Okay to hold Plavix/clopidogrel 5 days preop for surgeries or procedures. Would prefer to continue aspirin, but also okay to hold if necessary.      Relevant Medications   carvedilol (COREG) 6.25 MG tablet   Bilateral  lower extremity edema (Chronic)    Well-controlled with current dose of diuretic.      Coronary artery disease involving coronary bypass graft of native heart with angina pectoris (HCC) (Chronic)    Doing well. No further anginal symptoms. Continue current medications.  Plan:  Continue carvedilol,(but increase nighttime dose to 12.5 mg)  Continue combination of amlodipine plus Imdur for blood pressure and antianginal benefit.  Statin intolerant, is on Zetia  Continue aspirin plus Plavix for now given CAD, PAD, carotid disease and stroke.       Relevant Medications   carvedilol (COREG) 6.25 MG tablet   CKD (chronic kidney disease) stage 3, GFR 30-59 ml/min (HCC) (Chronic)   S/P CABG x 06-2010   Type II diabetes mellitus with complication ([place])    Be managed by PCP. A1c is 7.7 on Metformin and Amaryl.  With CKD 3B and hypertensive heart disease, low threshold to consider SGLT2 inhibitor or GLP-1 agonist         COVID-19 Education: The signs and symptoms of COVID-19 were discussed with the patient and how to seek care for testing (follow up with PCP or arrange E-visit).   The importance of social distancing was discussed today.  Time:   Today, I have spent 16 minutes with the patient with telehealth technology discussing the above problems.  8 minutes and charting-24 minutes total   Medication Adjustments/Labs and Tests Ordered: Current medicines are reviewed at length with the patient today.  Concerns regarding medicines are outlined above.   Patient Instructions  Medication Instructions:  INCREASE carvedilol (Coreg) PM dose to 2 tablet (12.5 mg) Continue taking 1.5 tablets (9.375 mg) in the AM  *If you need a refill on your cardiac medications before your next appointment, please call your pharmacy*   Follow-Up: At [facility], you and your health needs are our priority.  As part of our continuing mission to provide you with exceptional heart care, we have created  designated Provider Care Teams.  These Care Teams include your primary Cardiologist (physician) and Advanced Practice Providers (APPs -  Physician Assistants and Nurse Practitioners) who all work together to provide you with the care you need, when you need it.  We recommend signing up for the patient portal called "MyChart".  Sign up information is provided on this After Visit Summary.  MyChart is used to connect with patients for Virtual Visits (Telemedicine).  Patients are able to view lab/test results, encounter notes, upcoming appointments, etc.  Non-urgent messages can be sent to your provider as well.   To learn more about what you can do with MyChart, go to [email or web address].    Your next appointment:   6 month(s)  The format for your next appointment:   Can be virtual or in person  Provider:   Glenetta Hew, MD   Other Instructions Keep staying active.  Keep monitoring your blood pressure with the goal treatment options that you discussed with Tommy Medal, RPH- CCP  -> use additional hydralazine 50 mg her blood pressures sustained greater than 160 mmHg.     Signed, Glenetta Hew, MD  03/15/2020 5:30 PM    [place]

## 2020-03-15 NOTE — Assessment & Plan Note (Signed)
He still has residual essentially paralysis of the right hand. He is able to use the hand some, but has significantly decreased function.  Continue blood pressure control along lipid and glycemic control. Continue DAPT

## 2020-03-15 NOTE — Assessment & Plan Note (Signed)
No active CHF symptoms. He is on significant blood pressure medications, but has no PND, orthopnea and the edema is well controlled on current dose of furosemide.  He understands that he is okay to take additional dose of furosemide for worsening edema, orthopnea or DOE.

## 2020-03-15 NOTE — Assessment & Plan Note (Signed)
Be managed by PCP. A1c is 7.7 on Metformin and Amaryl.  With CKD 3B and hypertensive heart disease, low threshold to consider SGLT2 inhibitor or GLP-1 agonist

## 2020-03-15 NOTE — Assessment & Plan Note (Signed)
Doing well. No further anginal symptoms. Continue current medications.  Plan:  Continue carvedilol,(but increase nighttime dose to 12.5 mg)  Continue combination of amlodipine plus Imdur for blood pressure and antianginal benefit.  Statin intolerant, is on Zetia  Continue aspirin plus Plavix for now given CAD, PAD, carotid disease and stroke.

## 2020-03-15 NOTE — Patient Instructions (Addendum)
Medication Instructions:  INCREASE carvedilol (Coreg) PM dose to 2 tablet (12.5 mg) Continue taking 1.5 tablets (9.375 mg) in the AM  *If you need a refill on your cardiac medications before your next appointment, please call your pharmacy*   Follow-Up: At [facility], you and your health needs are our priority.  As part of our continuing mission to provide you with exceptional heart care, we have created designated Provider Care Teams.  These Care Teams include your primary Cardiologist (physician) and Advanced Practice Providers (APPs -  Physician Assistants and Nurse Practitioners) who all work together to provide you with the care you need, when you need it.  We recommend signing up for the patient portal called "MyChart".  Sign up information is provided on this After Visit Summary.  MyChart is used to connect with patients for Virtual Visits (Telemedicine).  Patients are able to view lab/test results, encounter notes, upcoming appointments, etc.  Non-urgent messages can be sent to your provider as well.   To learn more about what you can do with MyChart, go to [email or web address].    Your next appointment:   6 month(s)  The format for your next appointment:   Can be virtual or in person  Provider:   Glenetta Hew, MD   Other Instructions Keep staying active.  Keep monitoring your blood pressure with the goal treatment options that you discussed with Tommy Medal, RPH- CCP  -> use additional hydralazine 50 mg her blood pressures sustained greater than 160 mmHg.

## 2020-03-15 NOTE — Assessment & Plan Note (Addendum)
Last PCI was in January 2016 with a DES to the SVG-PDA. Last cath April 2019 with patent stents.   He has on DAPT with aspirin and Plavix (for combination of CAD, PAD and carotid disease)  Okay to hold Plavix/clopidogrel 5 days preop for surgeries or procedures. Would prefer to continue aspirin, but also okay to hold if necessary.

## 2020-03-15 NOTE — Assessment & Plan Note (Signed)
Well-controlled with current dose of diuretic.

## 2020-03-15 NOTE — Assessment & Plan Note (Signed)
Most recent lipids show LDL is 76 which is not back to 70s on Zetia with statin intolerance.  He is reluctant to take any additional medications. Would therefore hold off on adding Nexletol.

## 2020-03-15 NOTE — Assessment & Plan Note (Deleted)
Cellulitis/resistant hypertension on multiple medications including standing dose of diuretic (Lasix) hydralazine plus Imdur, amlodipine and carvedilol.  With borderline GFR, not avoiding ACE inhibitor/ARB. Also given his age and tendency for static hypotension we are wanting aggressive BP control. Target blood pressure range for him is an average SBP of 150 mmHg.  His blood pressure this morning was quite high for him. He says usually it is more like a second reading is 160/80. Because the elevated pressures in the mornings, will increase nighttime dose of carvedilol to 12.5 mg. Continue with 9.375 mg in the a.m.  Otherwise as needed hydralazine 50 mg for sustained blood pressure > 160 mmHg

## 2020-03-15 NOTE — Assessment & Plan Note (Signed)
Essentially (accelerated) resistant hypertension on multiple medications including standing dose of diuretic (Lasix) hydralazine plus Imdur, amlodipine and carvedilol.  With borderline GFR, not avoiding ACE inhibitor/ARB. Also given his age and tendency for static hypotension we are wanting aggressive BP control. Target blood pressure range for him is an average SBP of 150 mmHg.  His blood pressure this morning was quite high for him. He says usually it is more like a second reading is 160/80. Because the elevated pressures in the mornings, will increase nighttime dose of carvedilol to 12.5 mg. Continue with 9.375 mg in the a.m.  Otherwise as needed hydralazine 50 mg for sustained blood pressure > 160 mmHg

## 2020-04-03 ENCOUNTER — Telehealth: Payer: Self-pay | Admitting: Cardiology

## 2020-04-03 NOTE — Telephone Encounter (Signed)
Patient states for the past 3 weeks he has had a feeling of "pins and needles" in his left shoulder. He states when he bows his head or lifts his left arm, the pain subsides. However, over the past 4-5 days the pain has become worse. Patient denies chest pain and additional symptoms. Please return call to further discuss.

## 2020-04-03 NOTE — Telephone Encounter (Signed)
Spoke with pt and has noted this sensation (neck and shoulder pain) but can lay on left side and no issues but if sits up pain returns Also pt has been taking Tylenol with some relief Encouraged to follow up with PCP as this does not sound cardiac related Will forward to Dr Ellyn Hack for recommendations as well .Adonis Housekeeper

## 2020-04-05 ENCOUNTER — Other Ambulatory Visit: Payer: Self-pay | Admitting: Cardiology

## 2020-04-05 MED ORDER — CLOPIDOGREL BISULFATE 75 MG PO TABS: 75.0000 mg | ORAL_TABLET | Freq: Every day | ORAL | 2 refills | Status: DC

## 2020-04-05 NOTE — Telephone Encounter (Signed)
*  STAT* If patient is at the pharmacy, call can be transferred to refill team.   1. Which medications need to be refilled? (please list name of each medication and dose if known)  clopidogrel (PLAVIX) 75 MG tablet  2. Which pharmacy/location (including street and city if local pharmacy) is medication to be sent to? [id number] - [place], [place]  3. Do they need a 30 day or 90 day supply? 90 day supply  Pt completely out of medication.

## 2020-04-05 NOTE — Addendum Note (Signed)
Addended by: Hinton Dyer on: 04/05/2020 02:41 PM   Modules accepted: Orders

## 2020-04-06 NOTE — Telephone Encounter (Signed)
Agree - does not sound cardiac, Rec - PCP call.  [name] c

## 2020-04-30 ENCOUNTER — Other Ambulatory Visit: Payer: Self-pay | Admitting: Cardiology

## 2020-06-14 ENCOUNTER — Other Ambulatory Visit: Payer: Self-pay | Admitting: Adult Health

## 2020-12-01 ENCOUNTER — Other Ambulatory Visit: Payer: Self-pay | Admitting: Cardiology

## 2020-12-16 ENCOUNTER — Other Ambulatory Visit: Payer: Self-pay | Admitting: Thoracic Surgery (Cardiothoracic Vascular Surgery)

## 2021-01-01 ENCOUNTER — Telehealth: Payer: Self-pay | Admitting: Cardiology

## 2021-01-01 MED ORDER — EZETIMIBE 10 MG PO TABS: 10.0000 mg | ORAL_TABLET | Freq: Every day | ORAL | 0 refills | Status: DC

## 2021-01-01 MED ORDER — CLOPIDOGREL BISULFATE 75 MG PO TABS: 75.0000 mg | ORAL_TABLET | Freq: Every day | ORAL | 0 refills | Status: DC

## 2021-01-01 NOTE — Telephone Encounter (Signed)
Rx(s) sent to pharmacy electronically.  

## 2021-01-01 NOTE — Telephone Encounter (Signed)
*  STAT* If patient is at the pharmacy, call can be transferred to refill team.   1. Which medications need to be refilled? (please list name of each medication and dose if known)  clopidogrel (PLAVIX) 75 MG tablet ezetimibe (ZETIA) 10 MG tablet   2. Which pharmacy/location (including street and city if local pharmacy) is medication to be sent to? [id number] - [place], [place]  3. Do they need a 30 day or 90 day supply?   90 day supply  Patient states he has 4 Clopidogrel tablets and 7 Ezetimibe tablets remaining.

## 2021-02-05 ENCOUNTER — Telehealth: Payer: Self-pay | Admitting: Cardiology

## 2021-02-05 NOTE — Telephone Encounter (Signed)
   Patient Name: [name]  DOB: 1930-06-08 MRN: [id number]  Primary Cardiologist: Glenetta Hew, MD  Chart reviewed as part of pre-operative protocol coverage.   Simple dental extractions with local anesthesia are considered low risk procedures per guidelines and generally do not require any specific cardiac clearance.  I did speak with the patient who affirms he has not had any new concerning cardiac symptoms. Regarding blood thinner therapy, we generally do not recommend holding ASA or Plavix for 1-2 teeth being extracted at a time. However, if the dentist anticipates bleeding will be an issue with 3 teeth being extracted, Dr. Ellyn Hack has previously stated "Okay to hold Plavix/clopidogrel 5 days preop for surgeries or procedures. Would prefer to continue aspirin, but also okay to hold if necessary."  SBE prophylaxis is not required for the patient from a cardiac standpoint.  I will route this recommendation to the requesting party via Epic fax function and remove from pre-op pool. Patient was instructed to call if he developed any new cardiac symptoms warranting evaluation.  Please call with questions.  Charlie Pitter, PA-C 02/05/2021, 12:10 PM

## 2021-02-05 NOTE — Telephone Encounter (Signed)
     [facility] Pre-operative Risk Assessment    Patient Name: [name]  DOB: 15-Oct-1930 MRN: [id number]  HEARTCARE STAFF:  - IMPORTANT!!!!!! Under Visit Info/Reason for Call, type in Other and utilize the format Clearance MM/DD/YY or Clearance TBD. Do not use dashes or single digits. - Please review there is not already an duplicate clearance open for this procedure. - If request is for dental extraction, please clarify the # of teeth to be extracted. - If the patient is currently at the dentist's office, call Pre-Op Callback Staff (MA/nurse) to input urgent request.  - If the patient is not currently in the dentist office, please route to the Pre-Op pool.  Request for surgical clearance:  What type of surgery is being performed? 3 teeth extractions  When is this surgery scheduled? TBD  What type of clearance is required (medical clearance vs. Pharmacy clearance to hold med vs. Both)? BOTH  Are there any medications that need to be held prior to surgery and how long? Plavix and aspirin   Practice name and name of physician performing surgery? Dr. [facility]   What is the office phone number? [phone]   7.   What is the office fax number? [phone]  8.   Anesthesia type (None, local, MAC, general) ? Articaine   Angeline S Hammer 02/05/2021, 10:57 AM  _________________________________________________________________   (provider comments below)

## 2021-02-07 ENCOUNTER — Other Ambulatory Visit: Payer: Self-pay | Admitting: Cardiology

## 2021-02-25 NOTE — Progress Notes (Signed)
Cardiology Office Note:    Date:  02/26/2021   ID:  [name], DOB March 17, 1931, MRN [id number]  PCP:  Suzan Garibaldi, [place] Cardiologist: Glenetta Hew, MD   Reason for visit: 91-month follow-up  History of Present Illness:    [name] is a 87 y.o. male with a hx of CAD with NSTEMI in 11/2010 & CABGx2 (LIMA-LAD, SVG-PDA), PCI to SVG-PDA in 2016, STEMI November 2017 -> occluded SVG-RCA stent-overlapping Promus DES, Multiple PCI's SVG-PDA -> most recently in December 2019-70% ISR of the ostial stent treated with PTA.   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  He last saw Dr. Ellyn Hack in October 2021.  Pt noted exertional dyspnea, stable edema, significant right knee pain and consistent right arm/hand paresthesia/paralysis.  Dr. Ellyn Hack did increase nighttime dose of carvedilol to 12.5 mg.  Today patient comes in with his daughter.  He does mention some lightheadedness in the morning that improves after he takes his medications.  He does not feel like the room is spinning.  He denies falls.  He ambulates with a cane.  He lives alone after his wife died 4 months ago.  His daughter does visit 3 times a week (lives in [place]).  He wonders if he is overmedicated.  He mentions that vision changes where he has more trouble reading and does not feel comfortable to drive.  He saw his optometrist 4 weeks ago who stated no change.  They are looking for ophthalmology referral.  Patient mentions some shortness of breath and mild chest ache after exertion like vacuuming.  He states this is stable.  His activity is limited by significant right knee pain.  He is able to walk to the mailbox, vacuum and do his dishes.  He does cook for himself.  He has not been monitoring his blood pressure and glucose as well after his wife's passing.  Denies PND and  orthopnea.  He has lost about 5 pounds in the last 4 months.    Past Medical History:  Diagnosis Date   Abdominal aortic aneurysm    a. Aortic duplex 06/2014: mild aneurysmal dilatation of proximal abdominal aorta measuring 3.4x3.4cm. No sig change from 2012. 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 NSTEMI 11/2010 - CABG x2(LIMA to LAD, SVG to PDA). b. NEG Lexi MV 10/24/13, EF 53%, no perfusion abnormality, septal and apical HK noted. c. NSTEMI 05/2014 - s/p DES to SVG-RPDA 06/18/14 (Xience Alpine DES 3.0 x 18 mm -3.35 mm), EF 60-65; d. 03/2016 STEMI/PCI: LM nl, ost LAD 70%, mLCx 50%, pRCA 95% - mRCA 60%, dRPDA 70%, LIMA->LAD ok, o-p SVG->RPDA 100% (3.0x16 Promus DES overlapping prior stent).   Carpal tunnel syndrome of right wrist 06/24/2015   Cerebral infarction due to stenosis of left middle cerebral artery (HCC) 01/22/2015   Chronic anemia 04/17/2018   Chronic combined systolic and diastolic CHF (congestive heart failure) ([place])    Coronary artery disease involving coronary bypass graft of  native heart with angina pectoris ([place]) 09/19/2014   Cath for Inf STEMI 03/27/16  Conclusions: Significant native coronary artery disease, including 70% ostial LAD stenosis, 50% mid LCx lesion, and 99% proximal RCA disease with TIMI-1 flow (chronic per prior cath reports). Patent LIMA to LAD. Acutely occluded SVG to PDA within previously placed stent. Successful PCI to proximal SVG to PDA in-stent restenosis/thrombosis with placement of a Promus Pre   Coronary artery disease involving native coronary artery of native heart with angina pectoris ([place])    a. NSTEMI 11/2010 - CABG x2(LIMA to LAD, SVG  to PDA-11/2010). b. NEGATIVE Lexiscan Myoview 6/9/'15, EF 53%, no perfusion abnormality, septal and apical HK noted. C. NSTEMI 05/2014 - s/p DES to SVG-RPDA 06/18/14, EF 60-65%; -> Last Cath 08/2017 - patent stents in SVG-rPDA.   Diabetes mellitus treated with oral medication ([place]) 16/05/958   Diastolic dysfunction    a. 03/2016 Echo: EF 60-65%, no rwma, Gr1 DD, triv AI, Ao root 50mm, Asc Ao 33mm, triv MR.   Dilated cardiomyopathy ([place]) 04/26/2018   Dizziness    a. Carotid duplex 03/2014: mild fibrous plaque, no significant stenosis.   Essential hypertension    Foley catheter in place    "been wearing it for a couple months now" (03/26/2016)   Hemispheric carotid artery syndrome 12/22/2016   HTN (hypertension)    Hyperlipidemia associated with type 2 diabetes mellitus ([place])    Hypertensive crisis    Left middle cerebral artery stroke ([place]) 05/27/2018   Leukocytosis 04/17/2018   Non-STEMI (non-ST elevated myocardial infarction) ([place]) 11/2010; 03/2018   1. Ostial LAD 70% (to close to [facility] for PCI), subtotal occlusion of the RCA; 2.  In-stent restenosis SVG-RCA -PTCA.   Peripheral arterial disease ([place]) 11/29/2018   Peripheral arterial disease   Pneumonia    as a baby   Polypharmacy 09/19/2014   Poorly controlled type 2 diabetes mellitus with peripheral neuropathy (HCC)    Postoperative atrial fibrillation ([place]) 11/2010   Post CABG, no sign recurrence   Refusal of blood transfusions as patient is Jehovah's Witness    S/P CABG x 17 November 2010   LIMA-LAD, SVG to PDA (Dr. Servando Snare)   Sleep apnea    Not on CPAP. (03/26/2016)   ST elevation myocardial infarction (STEMI) of inferior wall ([place]) 04/05/2016   Occluded in-stent restenosis/thrombosis of SVG-RCA --> treated with overlapping Promus DES 3.0 mm 16 mm postdilated 3.0 mm).   STEMI (ST elevation myocardial infarction) ([place]) 06/18/2014   Stenosis of left carotid artery    Stroke [facility]) 2014; 01/2015   a. 2014 with mild right hand weakness, nonhemorrhagic  per pt.;; b. - PTA-Stent L ICA 95%    Suprapubic catheter ([place])    Vitamin D deficiency 05/27/2018    Past Surgical History:  Procedure Laterality Date   APPENDECTOMY     CARDIAC CATHETERIZATION  12/11/2010   Dr. Chase Picket - subsequent cath - normal LV systolic function, no renal artery stenosis, severe 2-vessel disease with subtotaled RCA prox and distal 60% lesion and complex 70% area of narrowing of ostium of LAD   CARDIAC CATHETERIZATION N/A 03/26/2016   Procedure: Left Heart Cath and Coronary Angiography;  Surgeon: Nelva Bush, MD;  Location: [place] CV LAB;  Service: Cardiovascular;  Laterality: N/A;   CARDIAC CATHETERIZATION N/A 03/26/2016   Procedure: Coronary Stent Intervention;  Surgeon: Nelva Bush, MD;  Location: [place] CV LAB;  Service: Cardiovascular: 100% In-stent thrombosis of pros SVG-RCA (Xience DES) --> treated with PromusDES 3.0  x 18 (3.3 mm)   CARDIAC CATHETERIZATION N/A 03/26/2016   Procedure: Bypass Graft Angiography;  Surgeon: Nelva Bush, MD;  Location: [place] CV LAB;  Service: Cardiovascular;  Laterality: N/A;   CORONARY ANGIOPLASTY WITH STENT PLACEMENT  06/18/2014   PCI to SVG-RPDA 06/18/14 (Xience Alpine DES 3.0 x 18 mm -3.35 mm),    CORONARY ARTERY BYPASS GRAFT  12/15/2010   X2, Dr Servando Snare; LIMA to LAD, SVG to PDA;    CORONARY BALLOON ANGIOPLASTY N/A 04/18/2018   Procedure: CORONARY BALLOON ANGIOPLASTY;  Surgeon: Leonie Man, MD;  Location: [place] CV LAB;  Service: Cardiovascular;;; high pressure scoring and noncompliant balloon PTCA of SVG-RCA ISR ostial and proximal   CORONARY/GRAFT ANGIOGRAPHY N/A 08/27/2017   Procedure: CORONARY/GRAFT ANGIOGRAPHY;  Surgeon: Nelva Bush, MD;  Location: [place] CV LAB;  Service: Cardiovascular;; pLAD 70%, ostD1 50%.  mCx 60%, OM2 80%. pRCA 95% & mRCA 100% - rPDA 70%. LIMA-mLAD patent. SVG-rPDA 10% ISR.    CYSTOSCOPY WITH URETHRAL DILATATION     IR 3D INDEPENDENT WKST  10/26/2018   IR ANGIO  INTRA EXTRACRAN SEL COM CAROTID INNOMINATE BILAT MOD SED  02/25/2017   IR ANGIO INTRA EXTRACRAN SEL COM CAROTID INNOMINATE BILAT MOD SED  10/19/2017   Dr. Kathee Delton: L Common Carotid - ECA & major branches widely patent. ICA ~20% distal to bulb & 50% in supraclinoid segment. LMCA-distal 1/3 MI ~905 stenosis with post-stenotic dilation into inferior division. ~50% prox Basilar A stenosis @ anterior Inf Cerebellar A. 50% R ICA   IR ANGIO INTRA EXTRACRAN SEL COM CAROTID INNOMINATE UNI L MOD SED  06/06/2018   IR ANGIO INTRA EXTRACRAN SEL COM CAROTID INNOMINATE UNI L MOD SED  10/26/2018   IR ANGIO VERTEBRAL SEL SUBCLAVIAN INNOMINATE UNI L MOD SED  02/25/2017   IR ANGIO VERTEBRAL SEL SUBCLAVIAN INNOMINATE UNI R MOD SED  10/19/2017   IR ANGIO VERTEBRAL SEL VERTEBRAL UNI L MOD SED  10/19/2017   IR GENERIC HISTORICAL  01/21/2016   IR RADIOLOGIST EVAL & MGMT 01/21/2016 [facility]   IR GENERIC HISTORICAL  02/03/2016   IR CATHETER TUBE CHANGE 02/03/2016 Marybelle Killings, MD [facility]INTER[facility]   IR RADIOLOGIST EVAL & MGMT  11/09/2017   LEFT HEART CATH AND CORONARY ANGIOGRAPHY N/A 04/18/2018   Procedure: LEFT HEART CATH AND CORONARY ANGIOGRAPHY;  Surgeon: Leonie Man, MD;  Location: [place] CV LAB;  Service: Cardiovascular;  Laterality: N/A; stable findings on last cath with exception of 75% in-stent restenosis of SVG-RCA ostial stent treated with PTCA.     LEFT HEART CATHETERIZATION WITH CORONARY ANGIOGRAM N/A 06/18/2014   Procedure: LEFT HEART CATHETERIZATION WITH CORONARY ANGIOGRAM;  Surgeon: Leonie Man, MD;  Location: [facility] CATH LAB;  Service: Cardiovascular;  -- severe disease of SVG-rPDA   NO PAST SURGERIES     RADIOLOGY WITH ANESTHESIA N/A 01/24/2015   Procedure: STENT ASSISTED ANGIOPLASTY (RADIOLOGY WITH ANESTHESIA);  Surgeon: Luanne Bras, MD;  Location: [place];  Service: Radiology;  Laterality: N/A;   RADIOLOGY WITH ANESTHESIA N/A 06/06/2018   Procedure: STENTING;  Surgeon: Luanne Bras, MD;  Location: [place];  Service: Radiology;  Laterality: N/A;   RADIOLOGY WITH ANESTHESIA N/A 10/26/2018   Procedure: RADIOLOGY WITH ANESTHESIA;  Surgeon: Luanne Bras, MD;  Location: [place];  Service: Radiology;  Laterality: N/A;   TONSILLECTOMY     TRANSTHORACIC ECHOCARDIOGRAM  08/2017; 04/2018:    A) EF 60-65%. Mild LVH. No RWMA. Gr 1 DD. Mod LA dilation.;  B)  EF 35-40%.  Severe LVH.  GRII DD.  Apical anteroseptal hypokinesis.  Akinesis of the apex.    Current Medications: Current Meds  Medication Sig   furosemide (LASIX) 20 MG tablet Take 20 mg by mouth as needed. As Needed for Swelling and Edema     Allergies:   Atorvastatin, Crestor [rosuvastatin], Other, Pravastatin, Simvastatin, and Statins   Social History   Socioeconomic History   Marital status: Married    Spouse name: [name]   Number of children: 1   Years of education: Not on file   Highest education level: Not on file  Occupational History    Employer: RETIRED  Tobacco Use   Smoking status: Former    Packs/day: 0.33    Years: 10.00    Pack years: 3.30    Types: Cigarettes    Quit date: 1961    Years since quitting: 61.8   Smokeless tobacco: Never  Vaping Use   Vaping Use: Never used  Substance and Sexual Activity   Alcohol use: No   Drug use: No   Sexual activity: Never  Other Topics Concern   Not on file  Social History Narrative   Married father of one.  Previously uses stationary bike routinely.  Now reduced due to other social stressors.   Quit smoking 50 years ago.  Does not drink alcohol   Social Determinants of Health   Financial Resource Strain: Not on file  Food Insecurity: Not on file  Transportation Needs: Not on file  Physical Activity: Not on file  Stress: Not on file  Social Connections: Not on file     Family History: The patient's family history includes Heart Problems (age of onset: 79) in his father.  ROS:   Please see the history of present illness.     EKGs/Labs/Other Studies Reviewed:     EKG:  The ekg ordered today demonstrates sinus bradycardia, heart rate 53, QRS duration 100 ms.  Recent Labs: No results found for requested labs within last 8760 hours.   Recent Lipid Panel Lab Results  Component Value Date/Time   CHOL 138 12/26/2019 02:07 PM   TRIG 122 12/26/2019 02:07 PM   HDL 40 12/26/2019 02:07 PM   LDLCALC 76 12/26/2019 02:07 PM    Physical Exam:    VS:  BP (!) 144/72   Pulse (!) 53   Ht 6' (1.829 m)   Wt 203 lb (92.1 kg)   SpO2 98%   BMI 27.53 kg/m    No data found.  Wt Readings from Last 3 Encounters:  02/26/21 203 lb (92.1 kg)  03/15/20 200 lb (90.7 kg)  02/06/20 209 lb 9.6 oz (95.1 kg)     GEN:  no acute distress, elderly HEENT: Normal NECK: No JVD; No carotid bruits CARDIAC: RRR, no murmurs, rubs, gallops RESPIRATORY:  Clear to auscultation without rales, wheezing or rhonchi  ABDOMEN: Soft, non-tender, non-distended MUSCULOSKELETAL: No edema; No deformity  SKIN: Warm and dry NEUROLOGIC:  Alert and oriented PSYCHIATRIC:  Normal affect    ASSESSMENT AND PLAN   CAD with stable angina -s/p NSTEMI in 11/2010 & CABGx2 (LIMA-LAD, SVG-PDA), hx of multiple PCIs to SVG-PDA  -Continue anti-anginal therapy with amlodipine, Coreg and Imdur.   -With stable angina, do not feel inclined to do ischemic evaluation. -Continue aspirin and Plavix since he has had no bleeding issues.  Resistant hypertension -With dizziness and no edema, recommend changing Lasix to as needed. -Patient did not increase Coreg to 12.5 mg following his last  visit --patient will make that change now. -Recommend keeping a blood pressure log and following up in 2 months.  Goal SBP ~140-150s given age.  Chronic diastolic heart failure, euvolemic -2D echo 2020 with moderate LVH, EF 55% mildly dilated aortic root 42 mm -Change Lasix to as needed for leg swelling.  Hyperlipidemia -Statin intolerant, continue Zetia  Disposition - Follow-up in 2 months to review blood pressure  log and and evaluate for heart failure symptoms after changing Lasix to as needed.   Medication Adjustments/Labs and Tests Ordered: Current medicines are reviewed at length with the patient today.  Concerns regarding medicines are outlined above.  Orders Placed This Encounter  Procedures   Ambulatory referral to Ophthalmology    No orders of the defined types were placed in this encounter.   Patient Instructions  Medication Instructions:  No Changes *If you need a refill on your cardiac medications before your next appointment, please call your pharmacy*   Lab Work: No Labs If you have labs (blood work) drawn today and your tests are completely normal, you will receive your results only by: [place] (if you have MyChart) OR A paper copy in the mail If you have any lab test that is abnormal or we need to change your treatment, we will call you to review the results.   Testing/Procedures: No Testing   Follow-Up: At [facility], you and your health needs are our priority.  As part of our continuing mission to provide you with exceptional heart care, we have created designated Provider Care Teams.  These Care Teams include your primary Cardiologist (physician) and Advanced Practice Providers (APPs -  Physician Assistants and Nurse Practitioners) who all work together to provide you with the care you need, when you need it.  We recommend signing up for the patient portal called "MyChart".  Sign up information is provided on this After Visit Summary.  MyChart is used to connect with patients for Virtual Visits (Telemedicine).  Patients are able to view lab/test results, encounter notes, upcoming appointments, etc.  Non-urgent messages can be sent to your provider as well.   To learn more about what you can do with MyChart, go to [email or web address].    Your next appointment:   2 month(s)  The format for your next appointment:   In Person  Provider:   Caron Presume, PA-C   Other Instructions Blood Pressure Log Daily. Please bring to Next Appointment .   Signed, Warren Lacy, PA-C  02/26/2021 12:38 PM    [facility]

## 2021-02-26 ENCOUNTER — Ambulatory Visit (INDEPENDENT_AMBULATORY_CARE_PROVIDER_SITE_OTHER): Payer: Medicare Other | Admitting: Physician Assistant

## 2021-02-26 ENCOUNTER — Other Ambulatory Visit: Payer: Self-pay

## 2021-02-26 ENCOUNTER — Encounter: Payer: Self-pay | Admitting: Physician Assistant

## 2021-02-26 VITALS — BP 144/72 | HR 53 | Ht 72.0 in | Wt 203.0 lb

## 2021-02-26 DIAGNOSIS — I10 Essential (primary) hypertension: Secondary | ICD-10-CM | POA: Diagnosis not present

## 2021-02-26 DIAGNOSIS — I25709 Atherosclerosis of coronary artery bypass graft(s), unspecified, with unspecified angina pectoris: Secondary | ICD-10-CM

## 2021-02-26 DIAGNOSIS — I5032 Chronic diastolic (congestive) heart failure: Secondary | ICD-10-CM | POA: Diagnosis not present

## 2021-02-26 DIAGNOSIS — E785 Hyperlipidemia, unspecified: Secondary | ICD-10-CM

## 2021-02-26 NOTE — Patient Instructions (Signed)
Medication Instructions:  No Changes *If you need a refill on your cardiac medications before your next appointment, please call your pharmacy*   Lab Work: No Labs If you have labs (blood work) drawn today and your tests are completely normal, you will receive your results only by: [place] (if you have MyChart) OR A paper copy in the mail If you have any lab test that is abnormal or we need to change your treatment, we will call you to review the results.   Testing/Procedures: No Testing   Follow-Up: At [facility], you and your health needs are our priority.  As part of our continuing mission to provide you with exceptional heart care, we have created designated Provider Care Teams.  These Care Teams include your primary Cardiologist (physician) and Advanced Practice Providers (APPs -  Physician Assistants and Nurse Practitioners) who all work together to provide you with the care you need, when you need it.  We recommend signing up for the patient portal called "MyChart".  Sign up information is provided on this After Visit Summary.  MyChart is used to connect with patients for Virtual Visits (Telemedicine).  Patients are able to view lab/test results, encounter notes, upcoming appointments, etc.  Non-urgent messages can be sent to your provider as well.   To learn more about what you can do with MyChart, go to [email or web address].    Your next appointment:   2 month(s)  The format for your next appointment:   In Person  Provider:   Caron Presume, PA-C   Other Instructions Blood Pressure Log Daily. Please bring to Next Appointment .

## 2021-03-03 NOTE — Addendum Note (Signed)
Addended by: Merri Ray A on: 03/03/2021 09:02 AM   Modules accepted: Orders

## 2021-04-25 ENCOUNTER — Telehealth: Payer: Self-pay | Admitting: Cardiology

## 2021-04-25 NOTE — Telephone Encounter (Signed)
Pt is requesting for appt to be virtual... please advise

## 2021-04-25 NOTE — Telephone Encounter (Signed)
Returned call to pt he states that he is bed bound and is not able to come in. Ok to switch to virtual appt?

## 2021-04-26 NOTE — Telephone Encounter (Signed)
Is due to see Clotilde Dieter on the 14th.  Not sure if they can accommodate virtual or not.  My schedule for Friday is pretty busy, but if that is the only option for virtual.  We can try a second to that as my last visit this Friday on the 16th.  Glenetta Hew, MD .

## 2021-04-29 ENCOUNTER — Inpatient Hospital Stay (HOSPITAL_COMMUNITY)
Admission: EM | Admit: 2021-04-29 | Discharge: 2021-05-13 | DRG: 177 | Disposition: A | Discharge status: Rehabilitation Facility | Payer: Medicare Other | Attending: Osteopathic Medicine | Admitting: Osteopathic Medicine

## 2021-04-29 ENCOUNTER — Encounter (HOSPITAL_COMMUNITY): Payer: Self-pay | Admitting: Emergency Medicine

## 2021-04-29 ENCOUNTER — Emergency Department (HOSPITAL_COMMUNITY): Payer: Medicare Other

## 2021-04-29 ENCOUNTER — Other Ambulatory Visit: Payer: Self-pay

## 2021-04-29 DIAGNOSIS — Z66 Do not resuscitate: Secondary | ICD-10-CM | POA: Diagnosis present

## 2021-04-29 DIAGNOSIS — E1165 Type 2 diabetes mellitus with hyperglycemia: Secondary | ICD-10-CM | POA: Diagnosis not present

## 2021-04-29 DIAGNOSIS — J1282 Pneumonia due to coronavirus disease 2019: Secondary | ICD-10-CM | POA: Diagnosis present

## 2021-04-29 DIAGNOSIS — Z87891 Personal history of nicotine dependence: Secondary | ICD-10-CM

## 2021-04-29 DIAGNOSIS — K5900 Constipation, unspecified: Secondary | ICD-10-CM | POA: Diagnosis present

## 2021-04-29 DIAGNOSIS — L89311 Pressure ulcer of right buttock, stage 1: Secondary | ICD-10-CM | POA: Diagnosis present

## 2021-04-29 DIAGNOSIS — I6381 Other cerebral infarction due to occlusion or stenosis of small artery: Secondary | ICD-10-CM | POA: Diagnosis not present

## 2021-04-29 DIAGNOSIS — D631 Anemia in chronic kidney disease: Secondary | ICD-10-CM | POA: Diagnosis present

## 2021-04-29 DIAGNOSIS — E119 Type 2 diabetes mellitus without complications: Secondary | ICD-10-CM

## 2021-04-29 DIAGNOSIS — Z7189 Other specified counseling: Secondary | ICD-10-CM | POA: Diagnosis not present

## 2021-04-29 DIAGNOSIS — G459 Transient cerebral ischemic attack, unspecified: Secondary | ICD-10-CM | POA: Diagnosis not present

## 2021-04-29 DIAGNOSIS — G8191 Hemiplegia, unspecified affecting right dominant side: Secondary | ICD-10-CM | POA: Diagnosis present

## 2021-04-29 DIAGNOSIS — Z8679 Personal history of other diseases of the circulatory system: Secondary | ICD-10-CM

## 2021-04-29 DIAGNOSIS — D649 Anemia, unspecified: Secondary | ICD-10-CM

## 2021-04-29 DIAGNOSIS — R001 Bradycardia, unspecified: Secondary | ICD-10-CM | POA: Diagnosis present

## 2021-04-29 DIAGNOSIS — N183 Chronic kidney disease, stage 3 unspecified: Secondary | ICD-10-CM | POA: Diagnosis present

## 2021-04-29 DIAGNOSIS — I5042 Chronic combined systolic (congestive) and diastolic (congestive) heart failure: Secondary | ICD-10-CM | POA: Diagnosis present

## 2021-04-29 DIAGNOSIS — D638 Anemia in other chronic diseases classified elsewhere: Secondary | ICD-10-CM | POA: Diagnosis present

## 2021-04-29 DIAGNOSIS — Z7984 Long term (current) use of oral hypoglycemic drugs: Secondary | ICD-10-CM

## 2021-04-29 DIAGNOSIS — I25810 Atherosclerosis of coronary artery bypass graft(s) without angina pectoris: Secondary | ICD-10-CM | POA: Diagnosis present

## 2021-04-29 DIAGNOSIS — G9341 Metabolic encephalopathy: Secondary | ICD-10-CM | POA: Diagnosis not present

## 2021-04-29 DIAGNOSIS — E1142 Type 2 diabetes mellitus with diabetic polyneuropathy: Secondary | ICD-10-CM | POA: Diagnosis present

## 2021-04-29 DIAGNOSIS — M1711 Unilateral primary osteoarthritis, right knee: Secondary | ICD-10-CM | POA: Diagnosis present

## 2021-04-29 DIAGNOSIS — G9349 Other encephalopathy: Secondary | ICD-10-CM | POA: Diagnosis not present

## 2021-04-29 DIAGNOSIS — Z531 Procedure and treatment not carried out because of patient's decision for reasons of belief and group pressure: Secondary | ICD-10-CM | POA: Diagnosis present

## 2021-04-29 DIAGNOSIS — Z9861 Coronary angioplasty status: Secondary | ICD-10-CM

## 2021-04-29 DIAGNOSIS — I252 Old myocardial infarction: Secondary | ICD-10-CM

## 2021-04-29 DIAGNOSIS — N1832 Chronic kidney disease, stage 3b: Secondary | ICD-10-CM | POA: Diagnosis present

## 2021-04-29 DIAGNOSIS — I639 Cerebral infarction, unspecified: Secondary | ICD-10-CM | POA: Diagnosis not present

## 2021-04-29 DIAGNOSIS — E1122 Type 2 diabetes mellitus with diabetic chronic kidney disease: Secondary | ICD-10-CM | POA: Diagnosis present

## 2021-04-29 DIAGNOSIS — E1169 Type 2 diabetes mellitus with other specified complication: Secondary | ICD-10-CM | POA: Diagnosis present

## 2021-04-29 DIAGNOSIS — I69319 Unspecified symptoms and signs involving cognitive functions following cerebral infarction: Secondary | ICD-10-CM | POA: Diagnosis not present

## 2021-04-29 DIAGNOSIS — I130 Hypertensive heart and chronic kidney disease with heart failure and stage 1 through stage 4 chronic kidney disease, or unspecified chronic kidney disease: Secondary | ICD-10-CM | POA: Diagnosis present

## 2021-04-29 DIAGNOSIS — E871 Hypo-osmolality and hyponatremia: Secondary | ICD-10-CM | POA: Diagnosis present

## 2021-04-29 DIAGNOSIS — Z888 Allergy status to other drugs, medicaments and biological substances status: Secondary | ICD-10-CM

## 2021-04-29 DIAGNOSIS — N179 Acute kidney failure, unspecified: Secondary | ICD-10-CM | POA: Diagnosis present

## 2021-04-29 DIAGNOSIS — R531 Weakness: Secondary | ICD-10-CM | POA: Diagnosis present

## 2021-04-29 DIAGNOSIS — G4733 Obstructive sleep apnea (adult) (pediatric): Secondary | ICD-10-CM | POA: Diagnosis present

## 2021-04-29 DIAGNOSIS — B965 Pseudomonas (aeruginosa) (mallei) (pseudomallei) as the cause of diseases classified elsewhere: Secondary | ICD-10-CM | POA: Diagnosis present

## 2021-04-29 DIAGNOSIS — Z8616 Personal history of COVID-19: Secondary | ICD-10-CM | POA: Diagnosis not present

## 2021-04-29 DIAGNOSIS — N189 Chronic kidney disease, unspecified: Secondary | ICD-10-CM

## 2021-04-29 DIAGNOSIS — Z9359 Other cystostomy status: Secondary | ICD-10-CM

## 2021-04-29 DIAGNOSIS — N390 Urinary tract infection, site not specified: Secondary | ICD-10-CM | POA: Diagnosis present

## 2021-04-29 DIAGNOSIS — Z79899 Other long term (current) drug therapy: Secondary | ICD-10-CM | POA: Diagnosis not present

## 2021-04-29 DIAGNOSIS — I714 Abdominal aortic aneurysm, without rupture, unspecified: Secondary | ICD-10-CM | POA: Diagnosis present

## 2021-04-29 DIAGNOSIS — T85590A Other mechanical complication of bile duct prosthesis, initial encounter: Secondary | ICD-10-CM

## 2021-04-29 DIAGNOSIS — I2510 Atherosclerotic heart disease of native coronary artery without angina pectoris: Secondary | ICD-10-CM | POA: Diagnosis present

## 2021-04-29 DIAGNOSIS — R339 Retention of urine, unspecified: Secondary | ICD-10-CM | POA: Diagnosis not present

## 2021-04-29 DIAGNOSIS — Z23 Encounter for immunization: Secondary | ICD-10-CM | POA: Diagnosis present

## 2021-04-29 DIAGNOSIS — E559 Vitamin D deficiency, unspecified: Secondary | ICD-10-CM | POA: Diagnosis present

## 2021-04-29 DIAGNOSIS — I69351 Hemiplegia and hemiparesis following cerebral infarction affecting right dominant side: Secondary | ICD-10-CM | POA: Diagnosis present

## 2021-04-29 DIAGNOSIS — Z7902 Long term (current) use of antithrombotics/antiplatelets: Secondary | ICD-10-CM

## 2021-04-29 DIAGNOSIS — Z7982 Long term (current) use of aspirin: Secondary | ICD-10-CM

## 2021-04-29 DIAGNOSIS — T380X5A Adverse effect of glucocorticoids and synthetic analogues, initial encounter: Secondary | ICD-10-CM | POA: Diagnosis not present

## 2021-04-29 DIAGNOSIS — K811 Chronic cholecystitis: Secondary | ICD-10-CM | POA: Diagnosis present

## 2021-04-29 DIAGNOSIS — L8990 Pressure ulcer of unspecified site, unspecified stage: Secondary | ICD-10-CM | POA: Insufficient documentation

## 2021-04-29 DIAGNOSIS — I4891 Unspecified atrial fibrillation: Secondary | ICD-10-CM | POA: Diagnosis present

## 2021-04-29 DIAGNOSIS — R29703 NIHSS score 3: Secondary | ICD-10-CM | POA: Diagnosis present

## 2021-04-29 DIAGNOSIS — E785 Hyperlipidemia, unspecified: Secondary | ICD-10-CM | POA: Diagnosis present

## 2021-04-29 DIAGNOSIS — Z951 Presence of aortocoronary bypass graft: Secondary | ICD-10-CM | POA: Diagnosis not present

## 2021-04-29 DIAGNOSIS — U071 COVID-19: Secondary | ICD-10-CM | POA: Diagnosis present

## 2021-04-29 DIAGNOSIS — I480 Paroxysmal atrial fibrillation: Secondary | ICD-10-CM | POA: Diagnosis present

## 2021-04-29 DIAGNOSIS — R7989 Other specified abnormal findings of blood chemistry: Secondary | ICD-10-CM

## 2021-04-29 DIAGNOSIS — Z515 Encounter for palliative care: Secondary | ICD-10-CM | POA: Diagnosis not present

## 2021-04-29 DIAGNOSIS — Z28311 Partially vaccinated for covid-19: Secondary | ICD-10-CM

## 2021-04-29 DIAGNOSIS — I6330 Cerebral infarction due to thrombosis of unspecified cerebral artery: Secondary | ICD-10-CM | POA: Insufficient documentation

## 2021-04-29 LAB — RESP PANEL BY RT-PCR (FLU A&B, COVID) ARPGX2
Influenza A by PCR: NEGATIVE
Influenza B by PCR: NEGATIVE
SARS Coronavirus 2 by RT PCR: POSITIVE — AB

## 2021-04-29 LAB — URINALYSIS, ROUTINE W REFLEX MICROSCOPIC
Bilirubin Urine: NEGATIVE
Glucose, UA: NEGATIVE mg/dL
Ketones, ur: NEGATIVE mg/dL
Nitrite: POSITIVE — AB
Protein, ur: 100 mg/dL — AB
Specific Gravity, Urine: 1.02 (ref 1.005–1.030)
pH: 6 (ref 5.0–8.0)

## 2021-04-29 LAB — CBC WITH DIFFERENTIAL/PLATELET
Abs Immature Granulocytes: 0 10*3/uL (ref 0.00–0.07)
Basophils Absolute: 0 10*3/uL (ref 0.0–0.1)
Basophils Relative: 0 %
Eosinophils Absolute: 0.1 10*3/uL (ref 0.0–0.5)
Eosinophils Relative: 2 %
HCT: 17.3 % — ABNORMAL LOW (ref 39.0–52.0)
Hemoglobin: 5.8 g/dL — CL (ref 13.0–17.0)
Lymphocytes Relative: 14 %
Lymphs Abs: 0.7 10*3/uL (ref 0.7–4.0)
MCH: 31.5 pg (ref 26.0–34.0)
MCHC: 33.5 g/dL (ref 30.0–36.0)
MCV: 94 fL (ref 80.0–100.0)
Monocytes Absolute: 1 10*3/uL (ref 0.1–1.0)
Monocytes Relative: 19 %
Neutro Abs: 3.4 10*3/uL (ref 1.7–7.7)
Neutrophils Relative %: 65 %
Platelets: 223 10*3/uL (ref 150–400)
RBC: 1.84 MIL/uL — ABNORMAL LOW (ref 4.22–5.81)
RDW: 20.1 % — ABNORMAL HIGH (ref 11.5–15.5)
WBC: 5.3 10*3/uL (ref 4.0–10.5)
nRBC: 0 /100 WBC
nRBC: 0.6 % — ABNORMAL HIGH (ref 0.0–0.2)

## 2021-04-29 LAB — COMPREHENSIVE METABOLIC PANEL
ALT: 14 U/L (ref 0–44)
AST: 23 U/L (ref 15–41)
Albumin: 3 g/dL — ABNORMAL LOW (ref 3.5–5.0)
Alkaline Phosphatase: 88 U/L (ref 38–126)
Anion gap: 9 (ref 5–15)
BUN: 44 mg/dL — ABNORMAL HIGH (ref 8–23)
CO2: 20 mmol/L — ABNORMAL LOW (ref 22–32)
Calcium: 8 mg/dL — ABNORMAL LOW (ref 8.9–10.3)
Chloride: 100 mmol/L (ref 98–111)
Creatinine, Ser: 2.88 mg/dL — ABNORMAL HIGH (ref 0.61–1.24)
GFR, Estimated: 20 mL/min — ABNORMAL LOW (ref >60)
Glucose, Bld: 229 mg/dL — ABNORMAL HIGH (ref 70–99)
Potassium: 4.1 mmol/L (ref 3.5–5.1)
Sodium: 129 mmol/L — ABNORMAL LOW (ref 135–145)
Total Bilirubin: 0.6 mg/dL (ref 0.3–1.2)
Total Protein: 6.3 g/dL — ABNORMAL LOW (ref 6.5–8.1)

## 2021-04-29 LAB — URINALYSIS, MICROSCOPIC (REFLEX)

## 2021-04-29 LAB — LIPASE, BLOOD: Lipase: 40 U/L (ref 11–51)

## 2021-04-29 MED ORDER — FLEET ENEMA 7-19 GM/118ML RE ENEM: 1.0000 | ENEMA | Freq: Once | RECTAL | Status: DC | PRN

## 2021-04-29 MED ORDER — ACETAMINOPHEN 325 MG PO TABS
650.0000 mg | ORAL_TABLET | Freq: Four times a day (QID) | ORAL | Status: DC | PRN
  Administered 2021-04-30: 11:00:00 650 mg via ORAL
  Filled 2021-04-29: qty 2

## 2021-04-29 MED ORDER — SODIUM CHLORIDE 0.9 % IV SOLN
250.0000 mg | Freq: Once | INTRAVENOUS | Status: AC
  Administered 2021-04-29: 250 mg via INTRAVENOUS
  Filled 2021-04-29: qty 20

## 2021-04-29 MED ORDER — ISOSORBIDE MONONITRATE ER 60 MG PO TB24
60.0000 mg | ORAL_TABLET | Freq: Two times a day (BID) | ORAL | Status: DC
  Administered 2021-04-29 – 2021-05-13 (×27): 60 mg via ORAL
  Filled 2021-04-29 (×7): qty 1
  Filled 2021-04-29: qty 2
  Filled 2021-04-29 (×5): qty 1
  Filled 2021-04-29: qty 2
  Filled 2021-04-29 (×13): qty 1

## 2021-04-29 MED ORDER — HYDROCOD POLST-CPM POLST ER 10-8 MG/5ML PO SUER
5.0000 mL | Freq: Two times a day (BID) | ORAL | Status: DC | PRN
  Administered 2021-05-11: 5 mL via ORAL
  Filled 2021-04-29 (×2): qty 5

## 2021-04-29 MED ORDER — SODIUM CHLORIDE 0.9 % IV SOLN: 250.0000 mL | INTRAVENOUS | Status: DC | PRN

## 2021-04-29 MED ORDER — EZETIMIBE 10 MG PO TABS
10.0000 mg | ORAL_TABLET | Freq: Every day | ORAL | Status: DC
  Administered 2021-04-30 – 2021-05-13 (×14): 10 mg via ORAL
  Filled 2021-04-29 (×14): qty 1

## 2021-04-29 MED ORDER — SODIUM CHLORIDE 0.9 % IV SOLN
200.0000 mg | Freq: Once | INTRAVENOUS | Status: AC
  Administered 2021-04-29: 200 mg via INTRAVENOUS
  Filled 2021-04-29: qty 40

## 2021-04-29 MED ORDER — ASPIRIN 81 MG PO CHEW
81.0000 mg | CHEWABLE_TABLET | Freq: Every day | ORAL | Status: DC
  Administered 2021-04-30 – 2021-05-13 (×14): 81 mg via ORAL
  Filled 2021-04-29 (×14): qty 1

## 2021-04-29 MED ORDER — CARVEDILOL 3.125 MG PO TABS: 9.3750 mg | ORAL_TABLET | Freq: Two times a day (BID) | ORAL | Status: DC

## 2021-04-29 MED ORDER — ONDANSETRON HCL 4 MG PO TABS: 4.0000 mg | ORAL_TABLET | Freq: Four times a day (QID) | ORAL | Status: DC | PRN

## 2021-04-29 MED ORDER — LACTATED RINGERS IV BOLUS
1000.0000 mL | Freq: Once | INTRAVENOUS | Status: AC
  Administered 2021-04-29: 1000 mL via INTRAVENOUS

## 2021-04-29 MED ORDER — SODIUM CHLORIDE 0.9% FLUSH
3.0000 mL | Freq: Two times a day (BID) | INTRAVENOUS | Status: DC
  Administered 2021-04-30 – 2021-05-11 (×13): 3 mL via INTRAVENOUS

## 2021-04-29 MED ORDER — POLYETHYLENE GLYCOL 3350 17 G PO PACK: 17.0000 g | PACK | Freq: Every day | ORAL | Status: DC | PRN

## 2021-04-29 MED ORDER — AMLODIPINE BESYLATE 10 MG PO TABS
10.0000 mg | ORAL_TABLET | Freq: Every day | ORAL | Status: DC
  Administered 2021-04-30 – 2021-05-13 (×14): 10 mg via ORAL
  Filled 2021-04-29 (×12): qty 1
  Filled 2021-04-29: qty 2
  Filled 2021-04-29: qty 1

## 2021-04-29 MED ORDER — ALBUTEROL SULFATE HFA 108 (90 BASE) MCG/ACT IN AERS
2.0000 | INHALATION_SPRAY | RESPIRATORY_TRACT | Status: DC | PRN
  Filled 2021-04-29: qty 6.7

## 2021-04-29 MED ORDER — CARVEDILOL 12.5 MG PO TABS
12.5000 mg | ORAL_TABLET | Freq: Every day | ORAL | Status: DC
  Administered 2021-04-29 – 2021-05-11 (×13): 12.5 mg via ORAL
  Filled 2021-04-29 (×14): qty 1

## 2021-04-29 MED ORDER — OXYCODONE HCL 5 MG PO TABS
5.0000 mg | ORAL_TABLET | ORAL | Status: DC | PRN
  Administered 2021-05-04: 5 mg via ORAL
  Filled 2021-04-29: qty 1

## 2021-04-29 MED ORDER — BISACODYL 5 MG PO TBEC: 5.0000 mg | DELAYED_RELEASE_TABLET | Freq: Every day | ORAL | Status: DC | PRN

## 2021-04-29 MED ORDER — HYDRALAZINE HCL 50 MG PO TABS
50.0000 mg | ORAL_TABLET | Freq: Two times a day (BID) | ORAL | Status: DC
  Administered 2021-04-29 – 2021-05-13 (×28): 50 mg via ORAL
  Filled 2021-04-29 (×10): qty 1
  Filled 2021-04-29: qty 2
  Filled 2021-04-29 (×15): qty 1
  Filled 2021-04-29: qty 2
  Filled 2021-04-29: qty 1

## 2021-04-29 MED ORDER — METHYLPREDNISOLONE SODIUM SUCC 125 MG IJ SOLR
0.5000 mg/kg | Freq: Two times a day (BID) | INTRAMUSCULAR | Status: DC
  Administered 2021-04-29 – 2021-05-01 (×4): 46.25 mg via INTRAVENOUS
  Filled 2021-04-29 (×4): qty 2

## 2021-04-29 MED ORDER — GUAIFENESIN-DM 100-10 MG/5ML PO SYRP
10.0000 mL | ORAL_SOLUTION | ORAL | Status: DC | PRN
  Administered 2021-05-05 – 2021-05-08 (×2): 10 mL via ORAL
  Filled 2021-04-29 (×2): qty 10

## 2021-04-29 MED ORDER — SODIUM CHLORIDE 0.9 % IV SOLN
100.0000 mg | Freq: Every day | INTRAVENOUS | Status: AC
  Administered 2021-04-30 – 2021-05-03 (×4): 100 mg via INTRAVENOUS
  Filled 2021-04-29: qty 100
  Filled 2021-04-29: qty 20
  Filled 2021-04-29 (×2): qty 100
  Filled 2021-04-29: qty 20

## 2021-04-29 MED ORDER — SODIUM CHLORIDE 0.9% FLUSH
3.0000 mL | INTRAVENOUS | Status: DC | PRN
  Administered 2021-05-01: 3 mL via INTRAVENOUS

## 2021-04-29 MED ORDER — ZINC SULFATE 220 (50 ZN) MG PO CAPS
220.0000 mg | ORAL_CAPSULE | Freq: Every day | ORAL | Status: DC
  Administered 2021-04-29 – 2021-05-13 (×15): 220 mg via ORAL
  Filled 2021-04-29 (×15): qty 1

## 2021-04-29 MED ORDER — DOXAZOSIN MESYLATE 2 MG PO TABS
4.0000 mg | ORAL_TABLET | Freq: Every day | ORAL | Status: DC
  Administered 2021-04-30 – 2021-05-13 (×14): 4 mg via ORAL
  Filled 2021-04-29: qty 1
  Filled 2021-04-29 (×13): qty 2

## 2021-04-29 MED ORDER — ONDANSETRON HCL 4 MG/2ML IJ SOLN: 4.0000 mg | Freq: Four times a day (QID) | INTRAMUSCULAR | Status: DC | PRN

## 2021-04-29 MED ORDER — DOCUSATE SODIUM 100 MG PO CAPS
100.0000 mg | ORAL_CAPSULE | Freq: Two times a day (BID) | ORAL | Status: DC
  Administered 2021-04-29 – 2021-05-11 (×23): 100 mg via ORAL
  Filled 2021-04-29 (×24): qty 1

## 2021-04-29 MED ORDER — PREDNISONE 50 MG PO TABS: 50.0000 mg | ORAL_TABLET | Freq: Every day | ORAL | Status: DC

## 2021-04-29 MED ORDER — CLOPIDOGREL BISULFATE 75 MG PO TABS
75.0000 mg | ORAL_TABLET | Freq: Every day | ORAL | Status: DC
  Administered 2021-04-30: 11:00:00 75 mg via ORAL
  Filled 2021-04-29: qty 1

## 2021-04-29 MED ORDER — CARVEDILOL 6.25 MG PO TABS
9.3750 mg | ORAL_TABLET | Freq: Every day | ORAL | Status: DC
  Administered 2021-04-30 – 2021-05-13 (×14): 9.375 mg via ORAL
  Filled 2021-04-29 (×7): qty 1
  Filled 2021-04-29: qty 3
  Filled 2021-04-29 (×6): qty 1

## 2021-04-29 MED ORDER — ASCORBIC ACID 500 MG PO TABS
500.0000 mg | ORAL_TABLET | Freq: Every day | ORAL | Status: DC
  Administered 2021-04-29 – 2021-05-13 (×15): 500 mg via ORAL
  Filled 2021-04-29 (×15): qty 1

## 2021-04-29 MED ORDER — INSULIN ASPART 100 UNIT/ML IJ SOLN
0.0000 [IU] | Freq: Three times a day (TID) | INTRAMUSCULAR | Status: DC
Start: 2021-04-30 — End: 2021-05-01
  Administered 2021-04-30: 09:00:00 5 [IU] via SUBCUTANEOUS
  Administered 2021-04-30 – 2021-05-01 (×4): 9 [IU] via SUBCUTANEOUS

## 2021-04-29 MED ORDER — HEPARIN SODIUM (PORCINE) 5000 UNIT/ML IJ SOLN
5000.0000 [IU] | Freq: Three times a day (TID) | INTRAMUSCULAR | Status: DC
  Administered 2021-04-29 – 2021-05-05 (×19): 5000 [IU] via SUBCUTANEOUS
  Filled 2021-04-29 (×22): qty 1

## 2021-04-29 MED ORDER — SODIUM CHLORIDE 0.9% FLUSH
3.0000 mL | Freq: Two times a day (BID) | INTRAVENOUS | Status: DC
  Administered 2021-04-30 – 2021-05-13 (×17): 3 mL via INTRAVENOUS

## 2021-04-29 NOTE — ED Provider Notes (Signed)
[facility] EMERGENCY DEPARTMENT Provider Note  CSN: [id number] Arrival date & time: 04/29/21 1220    History Chief Complaint  Patient presents with   Weakness    [name] is a 87 y.o. male with multiple medical problems still lives at home, has home health and PT and family support. He was recently admitted to [facility] in Oct/Nov for cholecystitis, had a biliary drain placed. Also has chronic suprapubic cath. He has been increasingly weak with low garde fever and dry cough at home the last few days. Now unable to get out bed without assistance. Unable to do ADLs. He has known CKD and anemia, has refused blood products in the past for religious reasons.    Past Medical History:  Diagnosis Date   Abdominal aortic aneurysm    a. Aortic duplex 06/2014: mild aneurysmal dilatation of proximal abdominal aorta measuring 3.4x3.4cm. No sig change from 2012. F/u due 06/2016;    Arthritis    "right knee; never bothered me" (03/26/2016)   Balanitis xerotica obliterans    with meatal stenosis and distal stricture   Carpal tunnel syndrome of right wrist 06/24/2015   Chronic anemia 04/17/2018   Chronic combined systolic and diastolic CHF (congestive heart failure) ([place])    Coronary artery disease involving coronary bypass graft of native heart with angina pectoris ([place]) 09/19/2014   Cath for Inf STEMI 03/27/16  Conclusions: Significant native coronary artery disease, including 70% ostial LAD stenosis, 50% mid LCx lesion, and 99% proximal RCA disease with TIMI-1 flow (chronic per prior cath reports). Patent LIMA to LAD. Acutely occluded SVG to PDA within previously placed stent. 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 (03/26/2016)   Stenosis of left carotid artery    Suprapubic catheter ([place])    Vitamin D deficiency 05/27/2018    Past Surgical History:  Procedure Laterality Date   APPENDECTOMY     CARDIAC CATHETERIZATION  12/11/2010   Dr. Chase Picket - subsequent cath - normal LV systolic function, no renal artery stenosis, severe 2-vessel disease with subtotaled RCA prox and distal 60% lesion and complex 70% area of narrowing of ostium of LAD   CARDIAC CATHETERIZATION N/A 03/26/2016   Procedure: Left Heart Cath and Coronary Angiography;  Surgeon: Nelva Bush, MD;  Location: [facility];  Service: Cardiovascular;  Laterality: N/A;   CARDIAC CATHETERIZATION N/A 03/26/2016   Procedure: Coronary Stent Intervention;  Surgeon: Nelva Bush, MD;  Location: [facility] [facility];  Service: Cardiovascular: 100% In-stent thrombosis of pros SVG-RCA (Xience DES) --> treated with PromusDES 3.0 x 18 (3.3 mm)   CARDIAC CATHETERIZATION N/A 03/26/2016   Procedure: Bypass Graft Angiography;  Surgeon: Nelva Bush, MD;  Location: [place] [facility];  Service: Cardiovascular;  Laterality: N/A;   CORONARY ANGIOPLASTY WITH STENT PLACEMENT  06/18/2014   PCI to SVG-RPDA 06/18/14 (Xience [place] DES 3.0 x 18  mm -3.35 mm),    CORONARY ARTERY BYPASS GRAFT  12/15/2010   X2, Dr Servando Snare; LIMA to LAD, SVG to PDA;    CORONARY BALLOON ANGIOPLASTY N/A 04/18/2018   Procedure: CORONARY BALLOON ANGIOPLASTY;  Surgeon: Leonie Man, MD;  Location: [facility];  Service: Cardiovascular;;; high pressure scoring and noncompliant balloon PTCA of SVG-RCA ISR ostial and proximal   CORONARY/GRAFT ANGIOGRAPHY N/A 08/27/2017    Procedure: CORONARY/GRAFT ANGIOGRAPHY;  Surgeon: Nelva Bush, MD;  Location: [facility];  Service: Cardiovascular;; pLAD 70%, ostD1 50%.  mCx 60%, OM2 80%. pRCA 95% & mRCA 100% - rPDA 70%. LIMA-mLAD patent. SVG-rPDA 10% ISR.    CYSTOSCOPY WITH URETHRAL DILATATION     IR 3D INDEPENDENT WKST  10/26/2018   IR ANGIO INTRA EXTRACRAN SEL COM CAROTID INNOMINATE BILAT MOD SED  02/25/2017   IR ANGIO INTRA EXTRACRAN SEL COM CAROTID INNOMINATE BILAT MOD SED  10/19/2017   Dr. Kathee Delton: L Common Carotid - ECA & major branches widely patent. ICA ~20% distal to bulb & 50% in supraclinoid segment. LMCA-distal 1/3 MI ~905 stenosis with post-stenotic dilation into inferior division. ~50% prox Basilar A stenosis @ anterior Inf Cerebellar A. 50% R ICA   IR ANGIO INTRA EXTRACRAN SEL COM CAROTID INNOMINATE UNI L MOD SED  06/06/2018   IR ANGIO INTRA EXTRACRAN SEL COM CAROTID INNOMINATE UNI L MOD SED  10/26/2018   IR ANGIO VERTEBRAL SEL SUBCLAVIAN INNOMINATE UNI L MOD SED  02/25/2017   IR ANGIO VERTEBRAL SEL SUBCLAVIAN INNOMINATE UNI R MOD SED  10/19/2017   IR ANGIO VERTEBRAL SEL VERTEBRAL UNI L MOD SED  10/19/2017   IR GENERIC HISTORICAL  01/21/2016   IR RADIOLOGIST EVAL & MGMT 01/21/2016 [facility]   IR GENERIC HISTORICAL  02/03/2016   IR CATHETER TUBE CHANGE 02/03/2016 Marybelle Killings, MD [facility]INTER[facility] RAD   IR RADIOLOGIST EVAL & MGMT  11/09/2017   LEFT HEART CATH AND CORONARY ANGIOGRAPHY N/A 04/18/2018   Procedure: LEFT HEART CATH AND CORONARY ANGIOGRAPHY;  Surgeon: Leonie Man, MD;  Location: [facility];  Service: Cardiovascular;  Laterality: N/A; stable findings on last cath with exception of 75% in-stent restenosis of SVG-RCA ostial stent treated with PTCA.     LEFT HEART CATHETERIZATION WITH CORONARY ANGIOGRAM N/A 06/18/2014   Procedure: LEFT HEART CATHETERIZATION WITH CORONARY ANGIOGRAM;  Surgeon: Leonie Man, MD;  Location: [facility] CATH LAB;  Service: Cardiovascular;  -- severe disease of SVG-rPDA   NO PAST  SURGERIES     RADIOLOGY WITH ANESTHESIA N/A 01/24/2015   Procedure: STENT ASSISTED ANGIOPLASTY (RADIOLOGY WITH ANESTHESIA);  Surgeon: Luanne Bras, MD;  Location: [facility];  Service: Radiology;  Laterality: N/A;   RADIOLOGY WITH ANESTHESIA N/A 06/06/2018   Procedure: STENTING;  Surgeon: Luanne Bras, MD;  Location: [facility];  Service: Radiology;  Laterality: N/A;   RADIOLOGY WITH ANESTHESIA N/A 10/26/2018   Procedure: RADIOLOGY WITH ANESTHESIA;  Surgeon: Luanne Bras, MD;  Location: [facility];  Service: Radiology;  Laterality: N/A;   TONSILLECTOMY     TRANSTHORACIC ECHOCARDIOGRAM  08/2017; 04/2018:    A) EF 60-65%. Mild LVH. No RWMA. Gr 1 DD. Mod LA dilation.;  B)  EF 35-40%.  Severe LVH.  GRII DD.  Apical anteroseptal hypokinesis.  Akinesis of the apex.    Family History  Problem Relation Age of Onset   Heart Problems Father 64    Social History   Tobacco Use   Smoking status: Former    Packs/day: 0.33  Years: 10.00    Pack years: 3.30    Types: Cigarettes    Quit date: 92    Years since quitting: 61.9   Smokeless tobacco: Never  Vaping Use   Vaping Use: Never used  Substance Use Topics   Alcohol use: No   Drug use: No     Home Medications Prior to Admission medications   Medication Sig Start Date End Date Taking? Authorizing Provider  acetaminophen (TYLENOL) 325 MG tablet Take 2 tablets (650 mg total) by mouth every 6 (six) hours as needed for mild pain (or Fever >/= 101). 05/31/18  Yes Angiulli, Lavon Paganini, PA-C  amLODipine (NORVASC) 10 MG tablet TAKE 1 TABLET BY MOUTH EVERY DAY IN THE EVENING Patient taking differently: Take 10 mg by mouth daily in the afternoon. 02/13/20  Yes Leonie Man, MD  aspirin 81 MG chewable tablet Chew 1 tablet (81 mg total) by mouth daily. 03/29/16  Yes Isaiah Serge, NP  carvedilol (COREG) 6.25 MG tablet Take 1.5 tablets (9.375 mg total) by mouth every morning AND 2 tablets (12.5 mg total) every evening. 03/15/20  Yes Leonie Man, MD  clopidogrel (PLAVIX) 75 MG tablet Take 1 tablet (75 mg total) by mouth daily. 01/01/21  Yes Leonie Man, MD  doxazosin (CARDURA) 4 MG tablet Take 1 tablet (4 mg total) by mouth daily. 03/27/19  Yes Lendon Colonel, NP  ezetimibe (ZETIA) 10 MG tablet Take 1 tablet (10 mg total) by mouth daily. 01/01/21 04/29/21 Yes Leonie Man, MD  Ferrous Sulfate 134 MG TABS Take 134 mg by mouth daily.   Yes [provider]  furosemide (LASIX) 20 MG tablet Take 20 mg by mouth as needed. As Needed for Swelling and Edema   Yes [provider]  glimepiride (AMARYL) 4 MG tablet TAKE 1 TABLET BY MOUTH DAILY WITH BREAKFAST Patient taking differently: Take 4 mg by mouth daily with breakfast. 02/13/20  Yes Leonie Man, MD  hydrALAZINE (APRESOLINE) 50 MG tablet Take 1 tablet (50 mg total) by mouth in the morning and at bedtime. 01/03/20  Yes Leonie Man, MD  isosorbide mononitrate (IMDUR) 60 MG 24 hr tablet TAKE 1 TABLET (60 MG TOTAL) BY MOUTH 2 (TWO) TIMES DAILY. 12/02/20  Yes Leonie Man, MD  metFORMIN (GLUCOPHAGE) 1000 MG tablet TAKE 0.5 TABLETS BY MOUTH 2 TIMES DAILY. 06/17/20  Yes Leonie Man, MD  nitroGLYCERIN (NITROSTAT) 0.4 MG SL tablet Place 0.4 mg under the tongue every 5 (five) minutes as needed for chest pain. 03/27/19  Yes [provider]  oxybutynin (DITROPAN) 5 MG tablet Take 5 mg by mouth daily as needed for bladder spasms.  09/07/17  Yes [provider]  pantoprazole (PROTONIX) 40 MG tablet Take 40 mg by mouth daily. Patient not taking: Reported on 02/26/2021 06/30/18   [provider]  Vitamin D, Ergocalciferol, (DRISDOL) 1.25 MG (50000 UT) CAPS capsule TAKE 1 CAPSULE BY MOUTH EVERY 7 DAYS ON [facility] EVENING Patient not taking: Reported on 02/26/2021 05/10/19   Lendon Colonel, NP     Allergies    Atorvastatin, Crestor [rosuvastatin], Other, Pravastatin, Simvastatin, and Statins   Review of Systems   Review of  Systems A comprehensive review of systems was completed and negative except as noted in HPI.    Physical Exam BP 135/73 (BP Location: Left Arm)    Pulse 83    Temp 98 F (36.7 C) (Oral)    Resp 18  Ht 6' (1.829 m)    Wt 92.1 kg    SpO2 99%    BMI 27.53 kg/m   Physical Exam Vitals and nursing note reviewed.  Constitutional:      Appearance: Normal appearance.  HENT:     Head: Normocephalic and atraumatic.     Nose: Nose normal.     Mouth/Throat:     Mouth: Mucous membranes are moist.  Eyes:     Extraocular Movements: Extraocular movements intact.     Conjunctiva/sclera: Conjunctivae normal.     Comments: Pale sclera  Cardiovascular:     Rate and Rhythm: Normal rate.  Pulmonary:     Effort: Pulmonary effort is normal.     Breath sounds: Rhonchi present.  Abdominal:     General: Abdomen is flat.     Palpations: Abdomen is soft.     Tenderness: There is no abdominal tenderness.     Comments: Biliary drain in RUQ, suprapubic catheter  Musculoskeletal:        General: No swelling. Normal range of motion.     Cervical back: Neck supple.  Skin:    General: Skin is warm and dry.     Coloration: Skin is pale.  Neurological:     General: No focal deficit present.     Mental Status: He is alert.  Psychiatric:        Mood and Affect: Mood normal.     ED Results / Procedures / Treatments   Labs (all labs ordered are listed, but only abnormal results are displayed) Labs Reviewed  RESP PANEL BY RT-PCR (FLU A&B, COVID) ARPGX2 - Abnormal; Notable for the following components:      Result Value   SARS Coronavirus 2 by RT PCR POSITIVE (*)    All other components within normal limits  CBC WITH DIFFERENTIAL/PLATELET - Abnormal; Notable for the following components:   RBC 1.84 (*)    Hemoglobin 5.8 (*)    HCT 17.3 (*)    RDW 20.1 (*)    nRBC 0.6 (*)    All other components within normal limits  COMPREHENSIVE METABOLIC PANEL - Abnormal; Notable for the following components:    Sodium 129 (*)    CO2 20 (*)    Glucose, Bld 229 (*)    BUN 44 (*)    Creatinine, Ser 2.88 (*)    Calcium 8.0 (*)    Total Protein 6.3 (*)    Albumin 3.0 (*)    GFR, Estimated 20 (*)    All other components within normal limits  URINE CULTURE  LIPASE, BLOOD  URINALYSIS, ROUTINE W REFLEX MICROSCOPIC  VITAMIN B12  FOLATE  IRON AND TIBC  FERRITIN  RETICULOCYTES    EKG None  Radiology CT ABDOMEN PELVIS WO CONTRAST  Result Date: 04/29/2021 CLINICAL DATA:  [place] last week, weakness, abdominal pain, cholecystostomy tube EXAM: CT ABDOMEN AND PELVIS WITHOUT CONTRAST TECHNIQUE: Multidetector CT imaging of the abdomen and pelvis was performed following the standard protocol without IV contrast. Unenhanced CT was performed per clinician order. Lack of IV contrast limits sensitivity and specificity, especially for evaluation of abdominal/pelvic solid viscera. COMPARISON:  03/10/2018 FINDINGS: Lower chest: There is a small multilocular left pleural effusion. Small free-flowing right pleural effusion is identified. Scattered areas of consolidation at the lung bases, most pronounced in the lower lobes, could reflect bronchopneumonia. Heart is enlarged without pericardial effusion. Hepatobiliary: A percutaneous cholecystostomy tube is identified, coiled within the gallbladder fundus. Gallbladder remains mildly distended, with gallbladder wall thickening  and pericholecystic fluid consistent with cholecystitis. I do not see any calcified gallstones. There is increased soft tissue density within the subcutaneous portions of the cholecystostomy tube, likely inflammatory. No definite fluid collection on this limited unenhanced exam. There is no intrahepatic biliary duct dilation. Unenhanced imaging of the liver is unremarkable. Pancreas: Unremarkable unenhanced appearance. Spleen: Unremarkable unenhanced appearance. Adrenals/Urinary Tract: Bilateral renal cortical atrophy. No urinary tract calculi or obstructive  uropathy. The adrenals are unremarkable. Bladder is decompressed with a suprapubic catheter. Stomach/Bowel: No bowel obstruction or ileus. Diffuse colonic diverticulosis without diverticulitis. No bowel wall thickening or inflammatory change. Vascular/Lymphatic: Subcentimeter lymph nodes at the porta hepatis are likely reactive. No pathologic adenopathy. There is extensive atherosclerosis throughout the aorta and its branches unchanged. Reproductive: Stable enlargement the prostate. Other: Trace pelvic free fluid. No free intraperitoneal gas. No abdominal wall hernia. Musculoskeletal: No acute or destructive bony lesions. Reconstructed images demonstrate no additional findings. IMPRESSION: 1. Inflammatory changes of the gallbladder consistent with cholecystitis. Indwelling cholecystostomy tube as above. 2. Soft tissue density along the subcutaneous portion of the cholecystostomy tube, which may be inflammatory. No definite fluid collection is observed on this unenhanced exam. 3. Bibasilar airspace disease and bilateral pleural effusions, consistent with bronchopneumonia. Left pleural effusion is partially loculated. 4. Colonic diverticulosis without diverticulitis. 5. Stable indwelling suprapubic catheter, with decompression of the urinary bladder. 6. Enlarged prostate. 7.  Aortic Atherosclerosis (ICD10-I70.0). Electronically Signed   By: Randa Ngo M.D.   On: 04/29/2021 15:41   DG Chest 2 View  Result Date: 04/29/2021 CLINICAL DATA:  Cough EXAM: CHEST - 2 VIEW COMPARISON:  Previous studies including the examination of 04/16/2018 due to technical difficulties some of the images in the previous studies are not available for review. FINDINGS: Transverse diameter of heart is increased. Thoracic aorta is tortuous and ectatic. There are linear densities in the lower lung fields suggesting scarring or subsegmental atelectasis. Part of this finding may be due to poor inspiration. There is no focal pulmonary  consolidation. There are no signs of alveolar pulmonary edema. There is evidence of previous coronary bypass surgery. Patient's chin is partly obscuring the apices. IMPRESSION: Cardiomegaly. Central pulmonary vessels are prominent without signs of alveolar pulmonary edema. There are linear densities in the lower lung fields suggesting scarring or subsegmental atelectasis. Electronically Signed   By: Elmer Picker M.D.   On: 04/29/2021 13:47    Procedures Procedures  Medications Ordered in the ED Medications  lactated ringers bolus 1,000 mL (has no administration in time range)     MDM Rules/Calculators/A&P MDM Patient with multiple medical problems here for increased weakness, cough and low grade fever. Now unable to do ADLs at home. Triage workup reviewed, he has CBC showing worsening anemia, he confirms he still refuses blood products today. He has normal WBC. CMP with acute on chronic kidney disease worse from recent baseline. Moderate hyponatremia, no signs of biliary obstruction   ED Course  I have reviewed the triage vital signs and the nursing notes.  Pertinent labs & imaging results that were available during my care of the patient were reviewed by me and considered in my medical decision making (see chart for details).  Clinical Course as of 04/29/21 1621  Tue Apr 29, 2021  1559 CT with continued signs of cholecystitis but drain is in place and no signs of sepsis or obstruction by labs.  [CS]  [id number] Spoke with Dr. Lorin Mercy, Hospitalist, who will evaluate for admission.  [CS]    Clinical Course  User Index [CS] Truddie Hidden, MD    Final Clinical Impression(s) / ED Diagnoses Final diagnoses:  COVID-19  Anemia, unspecified type  Chronic cholecystitis  AKI (acute kidney injury) ([place])  Chronic kidney disease, unspecified CKD stage    Rx / DC Orders ED Discharge Orders     None        Truddie Hidden, MD 04/29/21 1621

## 2021-04-29 NOTE — ED Triage Notes (Signed)
Patient presents with daughter concerned for bladder infection. Reports he has a drain from his gall bladder since Nov 3rd. Home health nurse concerned about drainage site. Was placed at [facility]. Daughter reports a fall last Thursday where his right knee gave out on him. Did not hit head. Patient denies any pain at this time. Daughter reports overall weakness over the past week.

## 2021-04-29 NOTE — H&P (Signed)
History and Physical    GAY RAPE LKG:[id number] DOB: [birth year] DOA: 04/29/2021  PCP: Suzan Garibaldi, FNP Consultants:  Ellyn Hack - cardiology; Raynor - urology Patient coming from:  Home - lives alone but his daughter is staying with him and he has home health currently; NOK: Daughter, Jeri Modena, [phone]  Chief Complaint: Cough, weakness  HPI: [name] is a 87 y.o. male with medical history significant of AAA; OSA; CAD s/p CABG; CVA; chronic combined CHF; DM; HTN; indwelling foley; HLD; Jehovah's witness; and recent cholecystostomy tube placement presenting with cough and generalized weakness.  He was last hospitalized at [facility] from 10/29-11/4 with acute cholecystitis.  Since he is not a surgical candidate due to his generalized conditions as well as anemia with refusal of blood products, he was treated with Zosyn and perc drain placement.  Of note, he previously had choledocholithiasis with biliary sphincterotomy and balloon extraction in 07/2019.  He received IV iron infusion on 10/29 with increase of Hgb from 6.5 to 7.2.  For the last week, he has had cough and worsening fatigue/weakness.  He has been unable to ambulate and his daughter was unable to care for him.  The [facility] aide came today and recommended that the patient be brought in for evaluation.    ED Course: COVID, anemia but refusal of blood products.  Unlikely active cholecystitis.    Review of Systems: As per HPI; otherwise review of systems reviewed and negative.    COVID Vaccine Status:  Complete  Past Medical History:  Diagnosis Date   Abdominal aortic aneurysm    a. Aortic duplex 06/2014: mild aneurysmal dilatation of proximal abdominal aorta measuring 3.4x3.4cm. No sig change from 2012. 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 Patent LIMA to LAD. Acutely occluded SVG to PDA within previously placed stent. 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 (03/26/2016)   Stenosis of left carotid artery    Suprapubic catheter ([place])    Vitamin D deficiency 05/27/2018    Past Surgical History:  Procedure Laterality Date   APPENDECTOMY     CARDIAC CATHETERIZATION  12/11/2010   Dr. Chase Picket - subsequent cath - normal LV systolic function, no renal artery stenosis, severe 2-vessel disease with subtotaled RCA prox and distal 60% lesion and  complex 70% area of narrowing of ostium of LAD   CARDIAC CATHETERIZATION N/A 03/26/2016   Procedure: Left Heart Cath and Coronary Angiography;  Surgeon: Nelva Bush, MD;  Location: [place] CV [facility];  Service: Cardiovascular;  Laterality: N/A;   CARDIAC CATHETERIZATION N/A 03/26/2016   Procedure: Coronary Stent Intervention;  Surgeon: Nelva Bush, MD;  Location: [place] [facility];   Service: Cardiovascular: 100% In-stent thrombosis of pros SVG-RCA (Xience DES) --> treated with PromusDES 3.0 x 18 (3.3 mm)   CARDIAC CATHETERIZATION N/A 03/26/2016   Procedure: Bypass Graft Angiography;  Surgeon: Nelva Bush, MD;  Location: [place] [facility];  Service: Cardiovascular;  Laterality: N/A;   CORONARY ANGIOPLASTY WITH STENT PLACEMENT  06/18/2014   PCI to SVG-RPDA 06/18/14 (Xience [place] DES 3.0 x 18 mm -3.35 mm),    CORONARY ARTERY BYPASS GRAFT  12/15/2010   X2, Dr Servando Snare; LIMA to LAD, SVG to PDA;    CORONARY BALLOON ANGIOPLASTY N/A 04/18/2018   Procedure: CORONARY BALLOON ANGIOPLASTY;  Surgeon: Leonie Man, MD;  Location: [place] [facility];  Service: Cardiovascular;;; high pressure scoring and noncompliant balloon PTCA of SVG-RCA ISR ostial and proximal   CORONARY/GRAFT ANGIOGRAPHY N/A 08/27/2017   Procedure: CORONARY/GRAFT ANGIOGRAPHY;  Surgeon: Nelva Bush, MD;  Location: [place] [facility];  Service: Cardiovascular;; pLAD 70%, ostD1 50%.  mCx 60%, OM2 80%. pRCA 95% & mRCA 100% - rPDA 70%. LIMA-mLAD patent. SVG-rPDA 10% ISR.    CYSTOSCOPY WITH URETHRAL DILATATION     IR 3D INDEPENDENT WKST  10/26/2018   IR ANGIO INTRA EXTRACRAN SEL COM CAROTID INNOMINATE BILAT MOD SED  02/25/2017   IR ANGIO INTRA EXTRACRAN SEL COM CAROTID INNOMINATE BILAT MOD SED  10/19/2017   Dr. Kathee Delton: L Common Carotid - ECA & major branches widely patent. ICA ~20% distal to bulb & 50% in supraclinoid segment. LMCA-distal 1/3 MI ~905 stenosis with post-stenotic dilation into inferior division. ~50% prox Basilar A stenosis @ anterior Inf Cerebellar A. 50% R ICA   IR ANGIO INTRA EXTRACRAN SEL COM CAROTID INNOMINATE UNI L MOD SED  06/06/2018   IR ANGIO INTRA EXTRACRAN SEL COM CAROTID INNOMINATE UNI L MOD SED  10/26/2018   IR ANGIO VERTEBRAL SEL SUBCLAVIAN INNOMINATE UNI L MOD SED  02/25/2017   IR ANGIO VERTEBRAL SEL SUBCLAVIAN INNOMINATE UNI R MOD SED  10/19/2017   IR ANGIO VERTEBRAL SEL VERTEBRAL UNI L MOD  SED  10/19/2017   IR GENERIC HISTORICAL  01/21/2016   IR RADIOLOGIST EVAL & MGMT 01/21/2016 [facility]-INTERV RAD   IR GENERIC HISTORICAL  02/03/2016   IR CATHETER TUBE CHANGE 02/03/2016 Marybelle Killings, MD [facility]-INTERV RAD   IR RADIOLOGIST EVAL & MGMT  11/09/2017   LEFT HEART CATH AND CORONARY ANGIOGRAPHY N/A 04/18/2018   Procedure: LEFT HEART CATH AND CORONARY ANGIOGRAPHY;  Surgeon: Leonie Man, MD;  Location: [place] [facility];  Service: Cardiovascular;  Laterality: N/A; stable findings on last cath with exception of 75% in-stent restenosis of SVG-RCA ostial stent treated with PTCA.     LEFT HEART CATHETERIZATION WITH CORONARY ANGIOGRAM N/A 06/18/2014   Procedure: LEFT HEART CATHETERIZATION WITH CORONARY ANGIOGRAM;  Surgeon: Leonie Man, MD;  Location: [facility] CATH LAB;  Service: Cardiovascular;  -- severe disease of SVG-rPDA   NO PAST SURGERIES     RADIOLOGY WITH ANESTHESIA N/A 01/24/2015   Procedure: STENT ASSISTED ANGIOPLASTY (RADIOLOGY WITH ANESTHESIA);  Surgeon: Luanne Bras, MD;  Location: [place];  Service: Radiology;  Laterality: N/A;   RADIOLOGY WITH ANESTHESIA  N/A 06/06/2018   Procedure: STENTING;  Surgeon: Luanne Bras, MD;  Location: [place];  Service: Radiology;  Laterality: N/A;   RADIOLOGY WITH ANESTHESIA N/A 10/26/2018   Procedure: RADIOLOGY WITH ANESTHESIA;  Surgeon: Luanne Bras, MD;  Location: [place];  Service: Radiology;  Laterality: N/A;   TONSILLECTOMY     TRANSTHORACIC ECHOCARDIOGRAM  08/2017; 04/2018:    A) EF 60-65%. Mild LVH. No RWMA. Gr 1 DD. Mod LA dilation.;  B)  EF 35-40%.  Severe LVH.  GRII DD.  Apical anteroseptal hypokinesis.  Akinesis of the apex.    Social History   Socioeconomic History   Marital status: Married    Spouse name: [name]   Number of children: 1   Years of education: Not on file   Highest education level: Not on file  Occupational History    Employer: RETIRED  Tobacco Use   Smoking status: Former    Packs/day: 0.33    Years: 10.00    Pack years:  3.30    Types: Cigarettes    Quit date: 1961    Years since quitting: 61.9   Smokeless tobacco: Never  Vaping Use   Vaping Use: Never used  Substance and Sexual Activity   Alcohol use: No   Drug use: No   Sexual activity: Never  Other Topics Concern   Not on file  Social History Narrative   Married father of one.  Previously uses stationary bike routinely.  Now reduced due to other social stressors.   Quit smoking 50 years ago.  Does not drink alcohol   Social Determinants of Health   Financial Resource Strain: Not on file  Food Insecurity: Not on file  Transportation Needs: Not on file  Physical Activity: Not on file  Stress: Not on file  Social Connections: Not on file  Intimate Partner Violence: Not on file    Allergies  Allergen Reactions   Atorvastatin Other (See Comments)    Myalgias   Crestor [Rosuvastatin] Other (See Comments)    Myalgias    Other Other (See Comments)    No  BLOOD PRODUCTS - Pt is Jehovah's Witness   Pravastatin Other (See Comments)    Myalgias   Simvastatin Other (See Comments)    Myalgias    Statins Other (See Comments)    Cause muscle aches    Family History  Problem Relation Age of Onset   Heart Problems Father 77    Prior to Admission medications   Medication Sig Start Date End Date Taking? Authorizing Provider  amLODipine (NORVASC) 10 MG tablet TAKE 1 TABLET BY MOUTH EVERY DAY IN THE EVENING 02/13/20  Yes Leonie Man, MD  aspirin 81 MG chewable tablet Chew 1 tablet (81 mg total) by mouth daily. 03/29/16  Yes Isaiah Serge, NP  carvedilol (COREG) 6.25 MG tablet Take 1.5 tablets (9.375 mg total) by mouth every morning AND 2 tablets (12.5 mg total) every evening. 03/15/20  Yes Leonie Man, MD  clopidogrel (PLAVIX) 75 MG tablet Take 1 tablet (75 mg total) by mouth daily. 01/01/21  Yes Leonie Man, MD  doxazosin (CARDURA) 4 MG tablet Take 1 tablet (4 mg total) by mouth daily. 03/27/19  Yes Lendon Colonel, NP   ezetimibe (ZETIA) 10 MG tablet Take 1 tablet (10 mg total) by mouth daily. 01/01/21 04/29/21 Yes Leonie Man, MD  Ferrous Sulfate 134 MG TABS Take by mouth.   Yes [provider]  furosemide (LASIX) 20 MG tablet Take 20  mg by mouth as needed. As Needed for Swelling and Edema   Yes [provider]  glimepiride (AMARYL) 4 MG tablet TAKE 1 TABLET BY MOUTH DAILY WITH BREAKFAST 02/13/20  Yes Leonie Man, MD  hydrALAZINE (APRESOLINE) 50 MG tablet Take 1 tablet (50 mg total) by mouth in the morning and at bedtime. 01/03/20  Yes Leonie Man, MD  isosorbide mononitrate (IMDUR) 60 MG 24 hr tablet TAKE 1 TABLET (60 MG TOTAL) BY MOUTH 2 (TWO) TIMES DAILY. 12/02/20  Yes Leonie Man, MD  metFORMIN (GLUCOPHAGE) 1000 MG tablet TAKE 0.5 TABLETS BY MOUTH 2 TIMES DAILY. 06/17/20  Yes Leonie Man, MD  oxybutynin (DITROPAN) 5 MG tablet Take 5 mg by mouth daily as needed for bladder spasms.  09/07/17  Yes [provider]  acetaminophen (TYLENOL) 325 MG tablet Take 2 tablets (650 mg total) by mouth every 6 (six) hours as needed for mild pain (or Fever >/= 101). 05/31/18   Angiulli, Lavon Paganini, PA-C  nitroGLYCERIN (NITROSTAT) 0.4 MG SL tablet Place under the tongue. 03/27/19   [provider]  pantoprazole (PROTONIX) 40 MG tablet Take 40 mg by mouth daily. Patient not taking: Reported on 02/26/2021 06/30/18   [provider]  Vitamin D, Ergocalciferol, (DRISDOL) 1.25 MG (50000 UT) CAPS capsule TAKE 1 CAPSULE BY MOUTH EVERY 7 DAYS ON [facility] EVENING Patient not taking: Reported on 02/26/2021 05/10/19   Lendon Colonel, NP    Physical Exam: Vitals:   04/29/21 1230 04/29/21 1242 04/29/21 1458  BP: 139/71  135/73  Pulse: 72  83  Resp: 16  18  Temp: 98.6 F (37 C)  98 F (36.7 C)  TempSrc: Oral  Oral  SpO2: 94%  99%  Weight:  92.1 kg   Height:  6' (1.829 m)      General:  Appears calm and comfortable and is in NAD; periodic coarse cough Eyes:   PERRL, EOMI, normal lids, iris ENT:  grossly normal hearing, lips & tongue, mmm Neck:  no LAD, masses or thyromegaly Cardiovascular:  RRR, no m/r/g. No LE edema.  Respiratory:   Diffuse rhonchi with diminished breath sounds on the left.  Normal respiratory effort. Abdomen:  soft, NT, ND, R perc chole tube in place; suprapubic catheter in place Skin:  no rash or induration seen on limited exam; R 3rd toe is bruised Musculoskeletal:  grossly normal tone BUE/BLE, no bony abnormality, contracture of R hand Psychiatric:  grossly normal mood and affect, speech fluent and appropriate, AOx3 Neurologic:  CN 2-12 grossly intact, moves all extremities in coordinated fashion    Radiological Exams on Admission: Independently reviewed - see discussion in A/P where applicable  CT ABDOMEN PELVIS WO CONTRAST  Result Date: 04/29/2021 CLINICAL DATA:  [place] last week, weakness, abdominal pain, cholecystostomy tube EXAM: CT ABDOMEN AND PELVIS WITHOUT CONTRAST TECHNIQUE: Multidetector CT imaging of the abdomen and pelvis was performed following the standard protocol without IV contrast. Unenhanced CT was performed per clinician order. Lack of IV contrast limits sensitivity and specificity, especially for evaluation of abdominal/pelvic solid viscera. COMPARISON:  03/10/2018 FINDINGS: Lower chest: There is a small multilocular left pleural effusion. Small free-flowing right pleural effusion is identified. Scattered areas of consolidation at the lung bases, most pronounced in the lower lobes, could reflect bronchopneumonia. Heart is enlarged without pericardial effusion. Hepatobiliary: A percutaneous cholecystostomy tube is identified, coiled within the gallbladder fundus. Gallbladder remains mildly distended, with gallbladder wall thickening and pericholecystic fluid consistent with cholecystitis. I do  not see any calcified gallstones. There is increased soft tissue density within the subcutaneous portions of the  cholecystostomy tube, likely inflammatory. No definite fluid collection on this limited unenhanced exam. There is no intrahepatic biliary duct dilation. Unenhanced imaging of the liver is unremarkable. Pancreas: Unremarkable unenhanced appearance. Spleen: Unremarkable unenhanced appearance. Adrenals/Urinary Tract: Bilateral renal cortical atrophy. No urinary tract calculi or obstructive uropathy. The adrenals are unremarkable. Bladder is decompressed with a suprapubic catheter. Stomach/Bowel: No bowel obstruction or ileus. Diffuse colonic diverticulosis without diverticulitis. No bowel wall thickening or inflammatory change. Vascular/Lymphatic: Subcentimeter lymph nodes at the porta hepatis are likely reactive. No pathologic adenopathy. There is extensive atherosclerosis throughout the aorta and its branches unchanged. Reproductive: Stable enlargement the prostate. Other: Trace pelvic free fluid. No free intraperitoneal gas. No abdominal wall hernia. Musculoskeletal: No acute or destructive bony lesions. Reconstructed images demonstrate no additional findings. IMPRESSION: 1. Inflammatory changes of the gallbladder consistent with cholecystitis. Indwelling cholecystostomy tube as above. 2. Soft tissue density along the subcutaneous portion of the cholecystostomy tube, which may be inflammatory. No definite fluid collection is observed on this unenhanced exam. 3. Bibasilar airspace disease and bilateral pleural effusions, consistent with bronchopneumonia. Left pleural effusion is partially loculated. 4. Colonic diverticulosis without diverticulitis. 5. Stable indwelling suprapubic catheter, with decompression of the urinary bladder. 6. Enlarged prostate. 7.  Aortic Atherosclerosis (ICD10-I70.0). Electronically Signed   By: Randa Ngo M.D.   On: 04/29/2021 15:41   DG Chest 2 View  Result Date: 04/29/2021 CLINICAL DATA:  Cough EXAM: CHEST - 2 VIEW COMPARISON:  Previous studies including the examination of  04/16/2018 due to technical difficulties some of the images in the previous studies are not available for review. FINDINGS: Transverse diameter of heart is increased. Thoracic aorta is tortuous and ectatic. There are linear densities in the lower lung fields suggesting scarring or subsegmental atelectasis. Part of this finding may be due to poor inspiration. There is no focal pulmonary consolidation. There are no signs of alveolar pulmonary edema. There is evidence of previous coronary bypass surgery. Patient's chin is partly obscuring the apices. IMPRESSION: Cardiomegaly. Central pulmonary vessels are prominent without signs of alveolar pulmonary edema. There are linear densities in the lower lung fields suggesting scarring or subsegmental atelectasis. Electronically Signed   By: Elmer Picker M.D.   On: 04/29/2021 13:47    EKG: not done   Labs on Admission: I have personally reviewed the available labs and imaging studies at the time of the admission.  Pertinent labs:   Na++ 129 Glucose 229 BUN 44/Creatinine 2.88/GFR 20; baseline creatinine just >2 with GFR about 30 Albumin 3.0 WBC 5.3 Hgb 5.8; 6.5-7.2 in 02/2021 COVID POSITIVE   Assessment/Plan Principal Problem:   COVID-19 Active Problems:   Postoperative atrial fibrillation (HCC)   Hyperlipidemia associated with type 2 diabetes mellitus (HCC)   CAD S/P percutaneous coronary angioplasty   Diabetes mellitus treated with oral medication (HCC)   AKI (acute kidney injury) ([place])   CKD (chronic kidney disease) stage 3, GFR 30-59 ml/min (HCC)   Suprapubic catheter (HCC)   Anemia of chronic disease   COVID-19 PNA -Patient with presenting with SOB, cough, and generalized weakness -He is not currently requiring [place] O2 -COVID POSITIVE -The patient has comorbidities which may increase the risk for ARDS/MODS including: age, HTN, DM, CKD, CAD -Pertinent labs concerning for COVID include normal WBC count; increased BUN/Creatinine -CXR  not diagnostic; CT chest showed multifocal opacities -Will admit for further evaluation, close monitoring, and treatment -Monitor  on telemetry x at least 24 hours -At this time, will attempt to avoid use of aerosolized medications and use HFAs instead -Will check daily labs including BMP with Mag, Phos; LFTs; CBC with differential; CRP; ferritin; fibrinogen; D-dimer -Will order steroids and Remdesivir (pharmacy consult) given +COVID test, +CXR -Will attempt to maintain euvolemia to a net negative fluid status -PT/OT consults -Encourage mobilization/ambulation as much as possible -Patient was seen wearing full PPE including: gown, gloves, N95, and face shield; donning and doffing was in compliance with current standards.  Symptomatic anemia with refusal of blood products -Patient with known chronic anemia, refusal of blood products due to religious exception -He is aware that refusal of blood products could precipitate death and actually has a signed paper on hand to attest to that fact -Per [facility] records, he is willing to receive IV iron and received an infusion during his hospitalization there about 6 weeks ago -Will give IV iron infusion here and continue to follow CBC -Would limit blood draws if not clearly necessary (consider stopping daily draws after tomorrow, and did not order COVID/anemia labs today for this reason)  AKI on CKD -Baseline GFR is about 30, currently 20 -Will trend with at least blood draw tomorrow  Recent cholecystitis -CT today with concern for acute cholecystitis but patient does not have any current symptoms, normal LFTs -Continue perc drain without intervention for now  DM Last A1c was 7.7 -hold Glucophage, glimepride -Cover with sensitive-scale SSI -While at [facility] he was on glipizide and Jardiance but these appear to have been stopped  HTN -Continue Norvasc, Coreg, hydralazine  CAD -s/p PCI (2019), CABD (2012) -No anginal symptoms currently -Continue ASA,  Plavix, Imdur  HLD -Continue Zetia  Afib -Rate controlled with Coreg -He does not appear to be taking AC for this issue despite prior CVA  Urinary retention -Has h/o balantis xerotica obliterans -Indwelling suprapubic catheter, recently changed    Level of care: Telemetry Medical DVT prophylaxis:  Heparin Code Status:  DNR - confirmed with patient/daughter Family Communication: Daughter was present throughout evaluation Disposition Plan:  The patient is from: home  Anticipated d/c is to: be determined  Anticipated d/c date will depend on clinical response to treatment, likely 2-3 days  Patient is currently: acutely ill Consults called: PT/OT  Admission status: Admit - It is my clinical opinion that admission to [place] is reasonable and necessary because of the expectation that this patient will require hospital care that crosses at least 2 midnights to treat this condition based on the medical complexity of the problems presented.  Given the aforementioned information, the predictability of an adverse outcome is felt to be significant.      Karmen Bongo MD [facility]   How to contact the [facility] Attending or Consulting provider [place] or covering provider during after hours [place], for this patient?  Check the care team in [facility] and look for a) attending/consulting [facility] provider listed and b) the [facility] team listed Log into [email or web address] and use [place]'s universal password to access. If you do not have the password, please contact the hospital operator. Locate the [facility] provider you are looking for under [facility] and page to a number that you can be directly reached. If you still have difficulty reaching the provider, please page the [facility] (Director on Call) for the Hospitalists listed on amion for assistance.   04/29/2021, 5:26 PM

## 2021-04-29 NOTE — ED Provider Notes (Signed)
Emergency Medicine Provider Triage Evaluation Note  [name] , a 87 y.o. male  was evaluated in triage.  Pt complains of weakness of 1 week duration.  Patient has chronic indwelling catheter which was last changed about 3 weeks ago.  He is also status post cholecystostomy tube placed about 5 weeks ago at [facility].  Abdomen tender on exam.  Cough present x4 weeks.  Review of Systems  Positive: See above. Negative: Fever, chest pain, constipation  Physical Exam  BP 139/71 (BP Location: Left Arm)    Pulse 72    Temp 98.6 F (37 C) (Oral)    Resp 16    Ht 6' (1.829 m)    Wt 92.1 kg    SpO2 94%    BMI 27.53 kg/m  Gen:   Awake, no distress   Resp:  Normal effort  MSK:   Moves extremities without difficulty  Other:  Abdomen tender.  Minimal erythema surrounding cholecystostomy tube.  Medical Decision Making  Medically screening exam initiated at 1:05 PM.  Appropriate orders placed.  [name] was informed that the remainder of the evaluation will be completed by another provider, this initial triage assessment does not replace that evaluation, and the importance of remaining in the ED until their evaluation is complete.     Evlyn Courier, PA-C 04/29/21 1307    Daleen Bo, MD 04/29/21 1537

## 2021-04-30 ENCOUNTER — Encounter (HOSPITAL_COMMUNITY): Payer: Self-pay | Admitting: Internal Medicine

## 2021-04-30 ENCOUNTER — Ambulatory Visit: Payer: Medicare Other | Admitting: Physician Assistant

## 2021-04-30 DIAGNOSIS — D638 Anemia in other chronic diseases classified elsewhere: Secondary | ICD-10-CM

## 2021-04-30 DIAGNOSIS — N179 Acute kidney failure, unspecified: Secondary | ICD-10-CM

## 2021-04-30 LAB — D-DIMER, QUANTITATIVE: D-Dimer, Quant: 3.05 ug/mL-FEU — ABNORMAL HIGH (ref 0.00–0.50)

## 2021-04-30 LAB — CBC WITH DIFFERENTIAL/PLATELET
Abs Immature Granulocytes: 0 10*3/uL (ref 0.00–0.07)
Band Neutrophils: 2 %
Basophils Absolute: 0 10*3/uL (ref 0.0–0.1)
Basophils Relative: 0 %
Eosinophils Absolute: 0 10*3/uL (ref 0.0–0.5)
Eosinophils Relative: 0 %
HCT: 18.7 % — ABNORMAL LOW (ref 39.0–52.0)
Hemoglobin: 6 g/dL — CL (ref 13.0–17.0)
Lymphocytes Relative: 13 %
Lymphs Abs: 0.4 10*3/uL — ABNORMAL LOW (ref 0.7–4.0)
MCH: 30.9 pg (ref 26.0–34.0)
MCHC: 32.1 g/dL (ref 30.0–36.0)
MCV: 96.4 fL (ref 80.0–100.0)
Monocytes Absolute: 0.2 10*3/uL (ref 0.1–1.0)
Monocytes Relative: 5 %
Neutro Abs: 2.5 10*3/uL (ref 1.7–7.7)
Neutrophils Relative %: 80 %
Platelets: 230 10*3/uL (ref 150–400)
RBC: 1.94 MIL/uL — ABNORMAL LOW (ref 4.22–5.81)
RDW: 20.2 % — ABNORMAL HIGH (ref 11.5–15.5)
WBC: 3 10*3/uL — ABNORMAL LOW (ref 4.0–10.5)
nRBC: 1.3 % — ABNORMAL HIGH (ref 0.0–0.2)
nRBC: 2 /100 WBC — ABNORMAL HIGH

## 2021-04-30 LAB — COMPREHENSIVE METABOLIC PANEL
ALT: 15 U/L (ref 0–44)
AST: 23 U/L (ref 15–41)
Albumin: 2.7 g/dL — ABNORMAL LOW (ref 3.5–5.0)
Alkaline Phosphatase: 90 U/L (ref 38–126)
Anion gap: 11 (ref 5–15)
BUN: 47 mg/dL — ABNORMAL HIGH (ref 8–23)
CO2: 18 mmol/L — ABNORMAL LOW (ref 22–32)
Calcium: 7.7 mg/dL — ABNORMAL LOW (ref 8.9–10.3)
Chloride: 100 mmol/L (ref 98–111)
Creatinine, Ser: 2.76 mg/dL — ABNORMAL HIGH (ref 0.61–1.24)
GFR, Estimated: 21 mL/min — ABNORMAL LOW (ref >60)
Glucose, Bld: 286 mg/dL — ABNORMAL HIGH (ref 70–99)
Potassium: 4.7 mmol/L (ref 3.5–5.1)
Sodium: 129 mmol/L — ABNORMAL LOW (ref 135–145)
Total Bilirubin: 1 mg/dL (ref 0.3–1.2)
Total Protein: 5.9 g/dL — ABNORMAL LOW (ref 6.5–8.1)

## 2021-04-30 LAB — PHOSPHORUS: Phosphorus: 4.9 mg/dL — ABNORMAL HIGH (ref 2.5–4.6)

## 2021-04-30 LAB — GLUCOSE, CAPILLARY
Glucose-Capillary: 449 mg/dL — ABNORMAL HIGH (ref 70–99)
Glucose-Capillary: 471 mg/dL — ABNORMAL HIGH (ref 70–99)

## 2021-04-30 LAB — CBG MONITORING, ED
Glucose-Capillary: 273 mg/dL — ABNORMAL HIGH (ref 70–99)
Glucose-Capillary: 377 mg/dL — ABNORMAL HIGH (ref 70–99)

## 2021-04-30 LAB — C-REACTIVE PROTEIN: CRP: 16.9 mg/dL — ABNORMAL HIGH (ref <1.0)

## 2021-04-30 LAB — MAGNESIUM: Magnesium: 2.3 mg/dL (ref 1.7–2.4)

## 2021-04-30 LAB — FERRITIN: Ferritin: 561 ng/mL — ABNORMAL HIGH (ref 24–336)

## 2021-04-30 MED ORDER — INSULIN GLARGINE-YFGN 100 UNIT/ML ~~LOC~~ SOLN
5.0000 [IU] | Freq: Every day | SUBCUTANEOUS | Status: DC
Start: 2021-04-30 — End: 2021-05-01
  Administered 2021-04-30 – 2021-05-01 (×2): 5 [IU] via SUBCUTANEOUS
  Filled 2021-04-30 (×2): qty 0.05

## 2021-04-30 MED ORDER — INSULIN ASPART 100 UNIT/ML IJ SOLN
13.0000 [IU] | Freq: Once | INTRAMUSCULAR | Status: AC
Start: 2021-05-01 — End: 2021-05-01
  Administered 2021-05-01: 13 [IU] via SUBCUTANEOUS

## 2021-04-30 MED ORDER — DARBEPOETIN ALFA 150 MCG/0.3ML IJ SOSY
150.0000 ug | PREFILLED_SYRINGE | INTRAMUSCULAR | Status: DC
  Administered 2021-04-30 – 2021-05-07 (×2): 150 ug via SUBCUTANEOUS
  Filled 2021-04-30 (×2): qty 0.3

## 2021-04-30 MED ORDER — PANTOPRAZOLE SODIUM 40 MG PO TBEC
40.0000 mg | DELAYED_RELEASE_TABLET | Freq: Every day | ORAL | Status: DC
  Administered 2021-04-30 – 2021-05-13 (×14): 40 mg via ORAL
  Filled 2021-04-30 (×14): qty 1

## 2021-04-30 NOTE — ED Notes (Signed)
Pt given lunch. Daughter is at bedside.

## 2021-04-30 NOTE — Progress Notes (Signed)
PROGRESS NOTE    [name]  OHY:[id number] DOB: [birth year] DOA: 04/29/2021 PCP: Suzan Garibaldi, FNP    Chief Complaint  Patient presents with   Weakness    Brief Narrative:     HPI: [name] is a 87 y.o. male with medical history significant of AAA; OSA; CAD s/p CABG; CVA; chronic combined CHF; DM; HTN; indwelling foley; HLD; Jehovah's witness; and recent cholecystostomy tube placement presenting with cough and generalized weakness.  He was last hospitalized at [facility] from 10/29-11/4 with acute cholecystitis.  Since he is not a surgical candidate due to his generalized conditions as well as anemia with refusal of blood products, he was treated with Zosyn and perc drain placement.  Of note, he previously had choledocholithiasis with biliary sphincterotomy and balloon extraction in 07/2019.  He received IV iron infusion on 10/29 with increase of Hgb from 6.5 to 7.2.  For the last week, he has had cough and worsening fatigue/weakness.  He has been unable to ambulate and his daughter was unable to care for him.  The [facility] aide came today and recommended that the patient be brought in for evaluation.       ED Course: COVID, anemia but refusal of blood products.  Unlikely active cholecystitis.        Assessment & Plan:   Principal Problem:   COVID-19 Active Problems:   Postoperative atrial fibrillation (HCC)   Hyperlipidemia associated with type 2 diabetes mellitus (HCC)   CAD S/P percutaneous coronary angioplasty   Diabetes mellitus treated with oral medication (HCC)   AKI (acute kidney injury) ([place])   CKD (chronic kidney disease) stage 3, GFR 30-59 ml/min (HCC)   Suprapubic catheter (HCC)   Anemia of chronic disease   DNR (do not resuscitate)    COVID-19 PNA -Patient with presenting with SOB, cough, and generalized weakness -He is not currently requiring Shorter O2 -COVID POSITIVE -The patient has comorbidities which may increase the risk for ARDS/MODS including: age, HTN, DM,  CKD, CAD -Pertinent labs concerning for COVID include normal WBC count; increased BUN/Creatinine -CT chest showed multifocal opacities -continue with Decadron and remdesivir   Symptomatic anemia with refusal of blood products -Patient with known chronic anemia, refusal of blood products due to religious exception -He is aware that refusal of blood products could precipitate death and actually has a signed paper on hand to attest to that fact -Per [facility] records, he is willing to receive IV iron and received an infusion during his hospitalization there about 6 weeks ago -Received IV iron yesterday, will start on Procrit as well. -We will minimize his blood draws to absolute necessity, and if indicated we will try on pediatric tube  AKI on CKD -Baseline GFR is about 30, currently 20 -Will trend with at least blood draw tomorrow -Avoid hypotension   Recent cholecystitis -CT today with concern for acute cholecystitis but patient does not have any current symptoms, normal LFTs -Continue perc drain without intervention for now   DM Last A1c was 7.7 -hold Glucophage, glimepride -Cover with sensitive-scale SSI, CBG are elevated, will start on low-dose Semglee -While at [facility] he was on glipizide and Jardiance but these appear to have been stopped   HTN -Continue Norvasc, Coreg, hydralazine   CAD -s/p PCI (2019), CABD (2012) -No anginal symptoms currently -Continue ASA, Plavix, Imdur   HLD -Continue Zetia   Afib -Rate controlled with Coreg -He does not appear to be taking AC for this issue despite prior CVA  Urinary retention -Has h/o balantis xerotica obliterans -Indwelling suprapubic catheter, recently changed       DVT prophylaxis: [place] heaprin Code Status: DNR Family Communication: D/W daughter lynnette by phone [phone] Disposition:   Status is: Inpatient  Remains inpatient appropriate because: treatment for COVID       Consultants:   None   Subjective:  Reports some generalized weakness and fatigue, he does report poor appetite Objective: Vitals:   04/30/21 0930 04/30/21 1035 04/30/21 1100 04/30/21 1130  BP:  (!) 149/84 138/67   Pulse: 99 84 (!) 103 63  Resp: (!) 26  (!) 21 16  Temp:      TempSrc:      SpO2: 90%  93% (!) 89%  Weight:      Height:        Intake/Output Summary (Last 24 hours) at 04/30/2021 1331 Last data filed at 04/30/2021 0906 Gross per 24 hour  Intake 1616.41 ml  Output 1110 ml  Net 506.41 ml   Filed Weights   04/29/21 1242  Weight: 92.1 kg    Examination:  Awake Alert, Oriented X 3, No new F.N deficits, Normal affect, frail Symmetrical Chest wall movement, Good air movement bilaterally, CTAB RRR,No Gallops,Rubs or new Murmurs, No Parasternal Heave +ve B.Sounds, Abd Soft, No tenderness, No rebound - guarding or rigidity. No Cyanosis, Clubbing or edema, No new Rash or bruise       Data Reviewed: I have personally reviewed following labs and imaging studies  CBC: Recent Labs  Lab 04/29/21 1314 04/30/21 0346  WBC 5.3 3.0*  NEUTROABS 3.4 2.5  HGB 5.8* 6.0*  HCT 17.3* 18.7*  MCV 94.0 96.4  PLT 223 244    Basic Metabolic Panel: Recent Labs  Lab 04/29/21 1314 04/30/21 0346  NA 129* 129*  K 4.1 4.7  CL 100 100  CO2 20* 18*  GLUCOSE 229* 286*  BUN 44* 47*  CREATININE 2.88* 2.76*  CALCIUM 8.0* 7.7*  MG  --  2.3  PHOS  --  4.9*    GFR: Estimated Creatinine Clearance: 20.3 mL/min (A) (by C-G formula based on SCr of 2.76 mg/dL (H)).  Liver Function Tests: Recent Labs  Lab 04/29/21 1314 04/30/21 0346  AST 23 23  ALT 14 15  ALKPHOS 88 90  BILITOT 0.6 1.0  PROT 6.3* 5.9*  ALBUMIN 3.0* 2.7*    CBG: Recent Labs  Lab 04/30/21 0819 04/30/21 1137  GLUCAP 273* 377*     Recent Results (from the past 240 hour(s))  Resp Panel by RT-PCR (Flu A&B, Covid) Nasopharyngeal Swab     Status: Abnormal   Collection Time: 04/29/21  1:16 PM   Specimen:  Nasopharyngeal Swab; Nasopharyngeal(NP) swabs in vial transport medium  Result Value Ref Range Status   SARS Coronavirus 2 by RT PCR POSITIVE (A) NEGATIVE Final    Comment: (NOTE) SARS-CoV-2 target nucleic acids are DETECTED.  The SARS-CoV-2 RNA is generally detectable in upper respiratory specimens during the acute phase of infection. Positive results are indicative of the presence of the identified virus, but do not rule out bacterial infection or co-infection with other pathogens not detected by the test. Clinical correlation with patient history and other diagnostic information is necessary to determine patient infection status. The expected result is Negative.  Fact Sheet for Patients: EntrepreneurPulse.com.au  Fact Sheet for Healthcare Providers: IncredibleEmployment.be  This test is not yet approved or cleared by the [place] FDA and  has been authorized for detection and/or diagnosis of SARS-CoV-2 by  FDA under an Emergency Use Authorization (EUA).  This EUA will remain in effect (meaning this test can be used) for the duration of  the COVID-19 declaration under Section 564(b)(1) of the A ct, 21 U.S.C. section 360bbb-3(b)(1), unless the authorization is terminated or revoked sooner.     Influenza A by PCR NEGATIVE NEGATIVE Final   Influenza B by PCR NEGATIVE NEGATIVE Final    Comment: (NOTE) The Xpert Xpress SARS-CoV-2/FLU/RSV plus assay is intended as an aid in the diagnosis of influenza from Nasopharyngeal swab specimens and should not be used as a sole basis for treatment. Nasal washings and aspirates are unacceptable for Xpert Xpress SARS-CoV-2/FLU/RSV testing.  Fact Sheet for Patients: EntrepreneurPulse.com.au  Fact Sheet for Healthcare Providers: IncredibleEmployment.be  This test is not yet approved or cleared by the [place] FDA and has been authorized for detection and/or  diagnosis of SARS-CoV-2 by FDA under an Emergency Use Authorization (EUA). This EUA will remain in effect (meaning this test can be used) for the duration of the COVID-19 declaration under Section 564(b)(1) of the Act, 21 U.S.C. section 360bbb-3(b)(1), unless the authorization is terminated or revoked.  Performed at [facility], [place] [address], [place], [place] [address]          Radiology Studies: CT ABDOMEN PELVIS WO CONTRAST  Result Date: 04/29/2021 CLINICAL DATA:  [place] last week, weakness, abdominal pain, cholecystostomy tube EXAM: CT ABDOMEN AND PELVIS WITHOUT CONTRAST TECHNIQUE: Multidetector CT imaging of the abdomen and pelvis was performed following the standard protocol without IV contrast. Unenhanced CT was performed per clinician order. Lack of IV contrast limits sensitivity and specificity, especially for evaluation of abdominal/pelvic solid viscera. COMPARISON:  03/10/2018 FINDINGS: Lower chest: There is a small multilocular left pleural effusion. Small free-flowing right pleural effusion is identified. Scattered areas of consolidation at the lung bases, most pronounced in the lower lobes, could reflect bronchopneumonia. Heart is enlarged without pericardial effusion. Hepatobiliary: A percutaneous cholecystostomy tube is identified, coiled within the gallbladder fundus. Gallbladder remains mildly distended, with gallbladder wall thickening and pericholecystic fluid consistent with cholecystitis. I do not see any calcified gallstones. There is increased soft tissue density within the subcutaneous portions of the cholecystostomy tube, likely inflammatory. No definite fluid collection on this limited unenhanced exam. There is no intrahepatic biliary duct dilation. Unenhanced imaging of the liver is unremarkable. Pancreas: Unremarkable unenhanced appearance. Spleen: Unremarkable unenhanced appearance. Adrenals/Urinary Tract: Bilateral renal cortical atrophy. No urinary tract  calculi or obstructive uropathy. The adrenals are unremarkable. Bladder is decompressed with a suprapubic catheter. Stomach/Bowel: No bowel obstruction or ileus. Diffuse colonic diverticulosis without diverticulitis. No bowel wall thickening or inflammatory change. Vascular/Lymphatic: Subcentimeter lymph nodes at the porta hepatis are likely reactive. No pathologic adenopathy. There is extensive atherosclerosis throughout the aorta and its branches unchanged. Reproductive: Stable enlargement the prostate. Other: Trace pelvic free fluid. No free intraperitoneal gas. No abdominal wall hernia. Musculoskeletal: No acute or destructive bony lesions. Reconstructed images demonstrate no additional findings. IMPRESSION: 1. Inflammatory changes of the gallbladder consistent with cholecystitis. Indwelling cholecystostomy tube as above. 2. Soft tissue density along the subcutaneous portion of the cholecystostomy tube, which may be inflammatory. No definite fluid collection is observed on this unenhanced exam. 3. Bibasilar airspace disease and bilateral pleural effusions, consistent with bronchopneumonia. Left pleural effusion is partially loculated. 4. Colonic diverticulosis without diverticulitis. 5. Stable indwelling suprapubic catheter, with decompression of the urinary bladder. 6. Enlarged prostate. 7.  Aortic Atherosclerosis (ICD10-I70.0). Electronically Signed   By: Legrand Como  Owens Shark M.D.   On: 04/29/2021 15:41   DG Chest 2 View  Result Date: 04/29/2021 CLINICAL DATA:  Cough EXAM: CHEST - 2 VIEW COMPARISON:  Previous studies including the examination of 04/16/2018 due to technical difficulties some of the images in the previous studies are not available for review. FINDINGS: Transverse diameter of heart is increased. Thoracic aorta is tortuous and ectatic. There are linear densities in the lower lung fields suggesting scarring or subsegmental atelectasis. Part of this finding may be due to poor inspiration. There is no  focal pulmonary consolidation. There are no signs of alveolar pulmonary edema. There is evidence of previous coronary bypass surgery. Patient's chin is partly obscuring the apices. IMPRESSION: Cardiomegaly. Central pulmonary vessels are prominent without signs of alveolar pulmonary edema. There are linear densities in the lower lung fields suggesting scarring or subsegmental atelectasis. Electronically Signed   By: Elmer Picker M.D.   On: 04/29/2021 13:47        Scheduled Meds:  amLODipine  10 mg Oral Q1500   vitamin C  500 mg Oral Daily   aspirin  81 mg Oral Daily   carvedilol  12.5 mg Oral QHS   carvedilol  9.375 mg Oral Daily   darbepoetin (ARANESP) injection - NON-DIALYSIS  150 mcg Subcutaneous Q Wed-1800   docusate sodium  100 mg Oral BID   doxazosin  4 mg Oral Daily   ezetimibe  10 mg Oral Daily   heparin  5,000 Units Subcutaneous Q8H   hydrALAZINE  50 mg Oral Q12H   insulin aspart  0-9 Units Subcutaneous TID WC   isosorbide mononitrate  60 mg Oral BID   methylPREDNISolone (SOLU-MEDROL) injection  0.5 mg/kg Intravenous Q12H   Followed by   Derrill Memo ON 05/02/2021] predniSONE  50 mg Oral Daily   pantoprazole  40 mg Oral Daily   sodium chloride flush  3 mL Intravenous Q12H   sodium chloride flush  3 mL Intravenous Q12H   zinc sulfate  220 mg Oral Daily   Continuous Infusions:  sodium chloride     remdesivir 100 mg in NS 100 mL 100 mg (04/30/21 1152)     LOS: 1 day      Phillips Climes, MD [facility]   To contact the attending provider between 7A-7P or the covering provider during after hours 7P-7A, please log into the web site [email or web address] and access using universal [place] password for that web site. If you do not have the password, please call the hospital operator.  04/30/2021, 1:31 PM

## 2021-04-30 NOTE — ED Notes (Signed)
Pt given breakfast.

## 2021-04-30 NOTE — Progress Notes (Signed)
NEW ADMISSION NOTE New Admission Note:   Arrival Method: E.D. stretcher bed Mental Orientation: alert and oriented x 4 Telemetry: NSR  Assessment: Completed Skin:No skin issue ,assessed with Darylene Price R.N. IV: Rt. F,A- NSL Pain:Denies Tubes:Supra pubic catheter ,drain bag change RLQ biliary drain. Safety Measures: Safety Fall Prevention Plan has been given, discussed and signed,bed alerm set on sensitive scale. Admission: Completed [facility] Orientation: Patient has been orientated to the room, unit and staff.  Family:None  Orders have been reviewed and implemented. Will continue to monitor the patient. Call light has been placed within reach and bed alarm has been activated.  Patient made aware that he is on isolation precaution for Covid -19,being he is positive.Visitation policy explained.  [place], Zenon Mayo, RN

## 2021-05-01 LAB — GLUCOSE, CAPILLARY
Glucose-Capillary: 387 mg/dL — ABNORMAL HIGH (ref 70–99)
Glucose-Capillary: 385 mg/dL — ABNORMAL HIGH (ref 70–99)
Glucose-Capillary: 496 mg/dL — ABNORMAL HIGH (ref 70–99)
Glucose-Capillary: 493 mg/dL — ABNORMAL HIGH (ref 70–99)
Glucose-Capillary: 350 mg/dL — ABNORMAL HIGH (ref 70–99)

## 2021-05-01 LAB — URINE CULTURE: Culture: >=100000 — AB

## 2021-05-01 MED ORDER — INSULIN ASPART 100 UNIT/ML IJ SOLN
0.0000 [IU] | Freq: Every day | INTRAMUSCULAR | Status: DC
  Administered 2021-05-01: 5 [IU] via SUBCUTANEOUS
  Administered 2021-05-05: 3 [IU] via SUBCUTANEOUS
  Administered 2021-05-08 – 2021-05-09 (×2): 2 [IU] via SUBCUTANEOUS

## 2021-05-01 MED ORDER — INSULIN GLARGINE-YFGN 100 UNIT/ML ~~LOC~~ SOLN
10.0000 [IU] | Freq: Every day | SUBCUTANEOUS | Status: DC
  Administered 2021-05-02 – 2021-05-07 (×6): 10 [IU] via SUBCUTANEOUS
  Filled 2021-05-01 (×6): qty 0.1

## 2021-05-01 MED ORDER — INSULIN ASPART 100 UNIT/ML IJ SOLN
5.0000 [IU] | Freq: Once | INTRAMUSCULAR | Status: AC
  Administered 2021-05-01: 5 [IU] via SUBCUTANEOUS

## 2021-05-01 MED ORDER — INSULIN GLARGINE-YFGN 100 UNIT/ML ~~LOC~~ SOLN
5.0000 [IU] | Freq: Once | SUBCUTANEOUS | Status: AC
  Administered 2021-05-01: 5 [IU] via SUBCUTANEOUS
  Filled 2021-05-01: qty 0.05

## 2021-05-01 MED ORDER — HALOPERIDOL LACTATE 5 MG/ML IJ SOLN
2.0000 mg | Freq: Once | INTRAMUSCULAR | Status: AC
  Administered 2021-05-01: 2 mg via INTRAVENOUS
  Filled 2021-05-01: qty 1

## 2021-05-01 MED ORDER — INSULIN ASPART 100 UNIT/ML IJ SOLN
0.0000 [IU] | Freq: Three times a day (TID) | INTRAMUSCULAR | Status: DC
Start: 2021-05-01 — End: 2021-05-13
  Administered 2021-05-01: 20 [IU] via SUBCUTANEOUS
  Administered 2021-05-02 (×2): 4 [IU] via SUBCUTANEOUS
  Administered 2021-05-02: 11 [IU] via SUBCUTANEOUS
  Administered 2021-05-03: 4 [IU] via SUBCUTANEOUS
  Administered 2021-05-03: 7 [IU] via SUBCUTANEOUS
  Administered 2021-05-03: 3 [IU] via SUBCUTANEOUS
  Administered 2021-05-04: 4 [IU] via SUBCUTANEOUS
  Administered 2021-05-04 – 2021-05-05 (×2): 3 [IU] via SUBCUTANEOUS
  Administered 2021-05-05: 4 [IU] via SUBCUTANEOUS
  Administered 2021-05-06: 3 [IU] via SUBCUTANEOUS
  Administered 2021-05-06: 4 [IU] via SUBCUTANEOUS
  Administered 2021-05-07 (×2): 3 [IU] via SUBCUTANEOUS
  Administered 2021-05-08: 4 [IU] via SUBCUTANEOUS
  Administered 2021-05-08: 3 [IU] via SUBCUTANEOUS
  Administered 2021-05-08 – 2021-05-09 (×2): 7 [IU] via SUBCUTANEOUS
  Administered 2021-05-09 – 2021-05-10 (×3): 3 [IU] via SUBCUTANEOUS
  Administered 2021-05-10 – 2021-05-11 (×3): 4 [IU] via SUBCUTANEOUS
  Administered 2021-05-11: 7 [IU] via SUBCUTANEOUS
  Administered 2021-05-12: 3 [IU] via SUBCUTANEOUS
  Administered 2021-05-12: 7 [IU] via SUBCUTANEOUS
  Administered 2021-05-12: 3 [IU] via SUBCUTANEOUS
  Administered 2021-05-13: 11 [IU] via SUBCUTANEOUS
  Administered 2021-05-13: 4 [IU] via SUBCUTANEOUS

## 2021-05-01 NOTE — Evaluation (Signed)
Occupational Therapy Evaluation Patient Details Name: [name] MRN: [id number] DOB: May 31, 1930 Today's Date: 05/01/2021   History of Present Illness 87 y.o. male presents to [facility] on 04/29/2021 with cough and weakness. Pt recently admitted at [facility] 10/29-11/4 with acute cholecystitis. Since discharge has been unable to ambulate due to weakness. Pt found to be COVID+ and anemic with Hgb of 6.0. PMH includes AAA; OSA; CAD s/p CABG; CVA; chronic combined CHF; DM; HTN; indwelling foley; HLD; Jehovah's witness.   Clinical Impression   Pt presents with decline in function and safety with ADLs and ADL mobility with impaired strength, balance and endurance. Pt reports that PTA, he lived at home alone but that his daughter has been staying with him recently. Pt reports that he was Ind with ADLs/selfcare, mobility with a rollater, Ind with simple meal prep. Pt currently requires min A with UB ADLs, max A with LB ADLs, mod A with toileting and min A with mobility using RW. Pt states that he would like to go to post acute rehab before returning home. Pt would benefit from acute OT services to address impairments to maximize level of function and safety     Recommendations for follow up therapy are one component of a multi-disciplinary discharge planning process, led by the attending physician.  Recommendations may be updated based on patient status, additional functional criteria and insurance authorization.   Follow Up Recommendations  Skilled nursing-short term rehab (<3 hours/day)    Assistance Recommended at Discharge Frequent or constant Supervision/Assistance  Functional Status Assessment  Patient has had a recent decline in their functional status and demonstrates the ability to make significant improvements in function in a reasonable and predictable amount of time.  Equipment Recommendations  Other (comment) (TBD at SNF, pt has DME at home)    Recommendations for Other Services        Precautions / Restrictions Precautions Precautions: Fall Restrictions Weight Bearing Restrictions: No      Mobility Bed Mobility Overal bed mobility: Needs Assistance Bed Mobility: Supine to Sit     Supine to sit: Min assist;HOB elevated     General bed mobility comments: pt in recliner upon arrival    Transfers Overall transfer level: Needs assistance Equipment used: Rolling walker (2 wheels) Transfers: Sit to/from Stand;Bed to chair/wheelchair/BSC Sit to Stand: Min assist;From elevated surface           General transfer comment: pt requiring assistance to power up into standing due to LE weakness      Balance Overall balance assessment: Needs assistance Sitting-balance support: No upper extremity supported;Feet supported Sitting balance-Leahy Scale: Fair     Standing balance support: Bilateral upper extremity supported;Reliant on assistive device for balance Standing balance-Leahy Scale: Poor                             ADL either performed or assessed with clinical judgement   ADL Overall ADL's : Needs assistance/impaired Eating/Feeding: Supervision/ safety;Independent   Grooming: Wash/dry hands;Wash/dry face;Minimal assistance;Standing   Upper Body Bathing: Min guard;Sitting   Lower Body Bathing: Maximal assistance   Upper Body Dressing : Min guard;Sitting   Lower Body Dressing: Maximal assistance   Toilet Transfer: Minimal assistance;Ambulation;Rolling walker (2 wheels);BSC/3in1;Cueing for safety   Toileting- Clothing Manipulation and Hygiene: Moderate assistance;Sit to/from stand       Functional mobility during ADLs: Minimal assistance;Cueing for safety;Rolling walker (2 wheels)       Vision Baseline Vision/History:  1 Wears glasses Ability to See in Adequate Light: 0 Adequate Patient Visual Report: No change from baseline       Perception     Praxis      Pertinent Vitals/Pain Pain Assessment: No/denies pain     Hand  Dominance Left   Extremity/Trunk Assessment Upper Extremity Assessment Upper Extremity Assessment: Generalized weakness   Lower Extremity Assessment Lower Extremity Assessment: Defer to PT evaluation   Cervical / Trunk Assessment Cervical / Trunk Assessment: Kyphotic   Communication Communication Communication: No difficulties   Cognition Arousal/Alertness: Awake/alert Behavior During Therapy: WFL for tasks assessed/performed Overall Cognitive Status: Within Functional Limits for tasks assessed                                       General Comments  VSS, pt on RA with sats in mid 90s with activity. Pt very pleasant and cooperative    Exercises     Shoulder Instructions      Home Living Family/patient expects to be discharged to:: Private residence Living Arrangements: Alone Available Help at Discharge: Family;Available 24 hours/day Type of Home: House Home Access: Ramped entrance     Home Layout: Two level;Able to live on main level with bedroom/bathroom Alternate Level Stairs-Number of Steps: flight   Bathroom Shower/Tub: Occupational psychologist: Handicapped height Bathroom Accessibility: Yes   Home Equipment: Wheelchair - power;Wheelchair - Publishing copy (2 wheels);Rollator (4 wheels);BSC/3in1;Shower seat          Prior Functioning/Environment Prior Level of Function : Independent/Modified Independent;Driving             Mobility Comments: ambulates with use of rollator ADLs Comments: Ind with ADLs/selfcare, simple meal prep        OT Problem List: Decreased strength;Impaired balance (sitting and/or standing);Decreased safety awareness;Decreased coordination;Decreased activity tolerance;Decreased knowledge of use of DME or AE      OT Treatment/Interventions: Self-care/ADL training;Patient/family education;Balance training;Therapeutic activities;DME and/or AE instruction    OT Goals(Current goals can be found in the  care plan section) Acute Rehab OT Goals Patient Stated Goal: "go to rehab for a few days then go home" OT Goal Formulation: With patient Time For Goal Achievement: 05/15/21 Potential to Achieve Goals: Good ADL Goals Pt Will Perform Grooming: with min guard assist;standing Pt Will Perform Upper Body Bathing: with supervision;with set-up;sitting Pt Will Perform Lower Body Bathing: with mod assist;sitting/lateral leans Pt Will Perform Upper Body Dressing: with supervision;with set-up;sitting Pt Will Perform Lower Body Dressing: with mod assist Pt Will Transfer to Toilet: with min guard assist;ambulating Pt Will Perform Toileting - Clothing Manipulation and hygiene: with min assist;sit to/from stand  OT Frequency: Min 2X/week   Barriers to D/C:            Co-evaluation              AM-PAC OT "6 Clicks" Daily Activity     Outcome Measure Help from another person eating meals?: None Help from another person taking care of personal grooming?: A Little Help from another person toileting, which includes using toliet, bedpan, or urinal?: A Lot Help from another person bathing (including washing, rinsing, drying)?: A Lot Help from another person to put on and taking off regular upper body clothing?: A Little Help from another person to put on and taking off regular lower body clothing?: Total 6 Click Score: 15   End of Session Equipment Utilized  During Treatment: Gait belt;Rolling walker (2 wheels)  Activity Tolerance: Patient tolerated treatment well Patient left: in chair;with call bell/phone within reach;with chair alarm set  OT Visit Diagnosis: Unsteadiness on feet (R26.81);Other abnormalities of gait and mobility (R26.89);History of falling (Z91.81);Muscle weakness (generalized) (M62.81)                Time: 2003-7944 OT Time Calculation (min): 28 min Charges:  OT General Charges $OT Visit: 1 Visit OT Evaluation $OT Eval Moderate Complexity: 1 Mod OT Treatments $Self  Care/Home Management : 8-22 mins    Britt Bottom 05/01/2021, 3:43 PM

## 2021-05-01 NOTE — Evaluation (Addendum)
Physical Therapy Evaluation Patient Details Name: [name] MRN: [id number] DOB: 10/15/1930 Today's Date: 05/01/2021  History of Present Illness  87 y.o. male presents to [facility] hospital on 04/29/2021 with cough and weakness. Pt recently admitted at [facility] 10/29-11/4 with acute cholecystitis. Since discharge has been unable to ambulate due to weakness. Pt found to be COVID+ and anemic with Hgb of 6.0. PMH includes AAA; OSA; CAD s/p CABG; CVA; chronic combined CHF; DM; HTN; indwelling foley; HLD; Jehovah's witness.  Clinical Impression  Pt presents to PT with deficits in functional mobility, gait, balance, power, endurance. Pt currently requires some physical assistance to aide in performing bed mobility and transfers due to generalized weakness. Pt is able to ambulate short household distances with UE support of walker, but fatigues quickly. Pt will benefit from aggressive mobilization to improve activity tolerance while also reducing caregiver burden and falls risk.  PT recommends SNF placement initially as pt remains weak and is typically able to mobilize independently. Patient would need caregiver assistance for all bed functional mobility at this time to safely discharge home.     Recommendations for follow up therapy are one component of a multi-disciplinary discharge planning process, led by the attending physician.  Recommendations may be updated based on patient status, additional functional criteria and insurance authorization.  Follow Up Recommendations Skilled nursing-short term rehab (<3 hours/day)    Assistance Recommended at Discharge Intermittent Supervision/Assistance  Functional Status Assessment Patient has had a recent decline in their functional status and demonstrates the ability to make significant improvements in function in a reasonable and predictable amount of time.  Equipment Recommendations  None recommended by PT (pt owns necessary DME)    Recommendations for Other  Services       Precautions / Restrictions Precautions Precautions: Fall Restrictions Weight Bearing Restrictions: No      Mobility  Bed Mobility Overal bed mobility: Needs Assistance Bed Mobility: Supine to Sit     Supine to sit: Min assist;HOB elevated          Transfers Overall transfer level: Needs assistance Equipment used: Rolling walker (2 wheels) Transfers: Sit to/from Stand Sit to Stand: Min assist;From elevated surface (also from recliner)           General transfer comment: pt requiring assistance to power up into standing due to LE weakness    Ambulation/Gait Ambulation/Gait assistance: Min guard Gait Distance (Feet): 40 Feet Assistive device: Rolling walker (2 wheels) Gait Pattern/deviations: Step-through pattern Gait velocity: reduced Gait velocity interpretation: <1.8 ft/sec, indicate of risk for recurrent falls   General Gait Details: pt with slowed step-through gait, reduced stride length  Stairs            Wheelchair Mobility    Modified Rankin (Stroke Patients Only)       Balance Overall balance assessment: Needs assistance Sitting-balance support: No upper extremity supported;Feet supported Sitting balance-Leahy Scale: Fair     Standing balance support: Bilateral upper extremity supported;Reliant on assistive device for balance Standing balance-Leahy Scale: Poor                               Pertinent Vitals/Pain Pain Assessment: No/denies pain    Home Living Family/patient expects to be discharged to:: Private residence Living Arrangements: Alone Available Help at Discharge: Family;Available 24 hours/day Type of Home: House Home Access: Ramped entrance       Home Layout: Two level;Able to live on main level with  bedroom/bathroom Home Equipment: Wheelchair - power;Wheelchair - Publishing copy (2 wheels);Rollator (4 wheels);BSC/3in1;Shower seat      Prior Function Prior Level of Function :  Independent/Modified Independent;Driving             Mobility Comments: ambulates with use of rollator       Hand Dominance   Dominant Hand: Left    Extremity/Trunk Assessment   Upper Extremity Assessment Upper Extremity Assessment: Generalized weakness    Lower Extremity Assessment Lower Extremity Assessment: Generalized weakness    Cervical / Trunk Assessment Cervical / Trunk Assessment: Kyphotic  Communication   Communication: No difficulties  Cognition Arousal/Alertness: Awake/alert Behavior During Therapy: WFL for tasks assessed/performed Overall Cognitive Status: Within Functional Limits for tasks assessed                                          General Comments General comments (skin integrity, edema, etc.): VSS, pt on RA with sats in mid 90s with activity    Exercises     Assessment/Plan    PT Assessment Patient needs continued PT services  PT Problem List Decreased strength;Decreased activity tolerance;Decreased balance;Decreased mobility;Cardiopulmonary status limiting activity       PT Treatment Interventions DME instruction;Gait training;Functional mobility training;Therapeutic activities;Therapeutic exercise;Balance training;Patient/family education    PT Goals (Current goals can be found in the Care Plan section)  Acute Rehab PT Goals Patient Stated Goal: to return to independence PT Goal Formulation: With patient Time For Goal Achievement: 05/15/21 Potential to Achieve Goals: Good    Frequency Min 3X/week   Barriers to discharge        Co-evaluation               AM-PAC PT "6 Clicks" Mobility  Outcome Measure Help needed turning from your back to your side while in a flat bed without using bedrails?: A Little Help needed moving from lying on your back to sitting on the side of a flat bed without using bedrails?: A Little Help needed moving to and from a bed to a chair (including a wheelchair)?: A Little Help  needed standing up from a chair using your arms (e.g., wheelchair or bedside chair)?: A Little Help needed to walk in hospital room?: A Little Help needed climbing 3-5 steps with a railing? : A Lot 6 Click Score: 17    End of Session   Activity Tolerance: Patient tolerated treatment well Patient left: in chair;with call bell/phone within reach;with chair alarm set Nurse Communication: Mobility status PT Visit Diagnosis: Other abnormalities of gait and mobility (R26.89);Muscle weakness (generalized) (M62.81)    Time: 6546-5035 PT Time Calculation (min) (ACUTE ONLY): 32 min   Charges:   PT Evaluation $PT Eval Low Complexity: [place], PT, DPT Acute Rehabilitation Pager: [phone] Office [phone]   Zenaida Niece 05/01/2021, 3:10 PM

## 2021-05-01 NOTE — Plan of Care (Signed)
  Problem: Clinical Measurements: Goal: Ability to maintain clinical measurements within normal limits will improve Outcome: Progressing   

## 2021-05-01 NOTE — Progress Notes (Addendum)
PROGRESS NOTE    [name]  OJJ:[id number] DOB: [birth year] DOA: 04/29/2021 PCP: Suzan Garibaldi, FNP    Chief Complaint  Patient presents with   Weakness    Brief Narrative:   [name] is a 87 y.o. male with medical history significant of AAA; OSA; CAD s/p CABG; CVA; chronic combined CHF; DM; HTN; indwelling foley; HLD; Jehovah's witness; and recent cholecystostomy tube placement presenting with cough and generalized weakness.  He was last hospitalized at [facility] from 10/29-11/4 with acute cholecystitis.  Since he is not a surgical candidate due to his generalized conditions as well as anemia with refusal of blood products, he was treated with Zosyn and perc drain placement.  Of note, he previously had choledocholithiasis with biliary sphincterotomy and balloon extraction in 07/2019.  He received IV iron infusion on 10/29 with increase of Hgb from 6.5 to 7.2.  For the last week, he has had cough and worsening fatigue/weakness.  He has been unable to ambulate and his daughter was unable to care for him.  The [facility] aide came today and recommended that the patient be brought in for evaluation. -His work-up in ED was significant for COVID-19 infection, with opacity on imaging, as well with anemia   hemoglobin of 5.8, patient is Jehovah's Witness and does not want any blood product transfusions.     Assessment & Plan:   Principal Problem:   COVID-19 Active Problems:   Postoperative atrial fibrillation (HCC)   Hyperlipidemia associated with type 2 diabetes mellitus (HCC)   CAD S/P percutaneous coronary angioplasty   Diabetes mellitus treated with oral medication (HCC)   AKI (acute kidney injury) ([place])   CKD (chronic kidney disease) stage 3, GFR 30-59 ml/min (HCC)   Suprapubic catheter (HCC)   Anemia of chronic disease   DNR (do not resuscitate)    COVID-19 PNA -Patient with presenting with SOB, cough, and generalized weakness -He is not currently requiring [place] O2 -COVID  POSITIVE -The patient has comorbidities which may increase the risk for ARDS/MODS including: age, HTN, DM, CKD, CAD -Pertinent labs concerning for COVID include normal WBC count; increased BUN/Creatinine -CT chest showed multifocal opacities -Continue  with IV remdesivir. -Given no hypoxia, and his significant hyperglycemia I will discontinue his steroids   Symptomatic anemia with refusal of blood products -Patient with known chronic anemia, refusal of blood products due to religious exception -He is aware that refusal of blood products could precipitate death and actually has a signed paper on hand to attest to that fact -Cussed with patient, he is agreeable to IV iron and Procrit, received IV iron on admission, and Procrit yesterday.  . -We will minimize his blood draws to absolute necessity, and if indicated we will try on pediatric tube  AKI on CKD 3b -Baseline GFR is about 30, currently 20 -Avoid hypotension   Recent cholecystitis -CT today with concern for acute cholecystitis but patient does not have any current symptoms, normal LFTs -Continue perc drain without intervention for now   DM Last A1c was 7.7 -hold Glucophage, glimepride -BG remains uncontrolled, most likely due to steroids, I will increase his Semglee, and I have discontinued steroids, will continue to monitor closely.   -While at [facility] he was on glipizide and Jardiance but these appear to have been stopped   HTN -Continue Norvasc, Coreg, hydralazine   CAD -s/p PCI (2019), CABD (2012) -No anginal symptoms currently -Continue ASA, mdur and statin, I will hold his Plavix given no stent over last year,  as well his profound anemia and he is on DVT prophylaxis as well.   HLD -Continue Zetia   Afib -Rate controlled with Coreg -He does not appear to be taking AC for this issue despite prior CVA   Urinary retention -Has h/o balantis xerotica obliterans -Indwelling suprapubic catheter, recently changed        DVT prophylaxis: [place] heaprin Code Status: DNR Family Communication: D/W daughter lynnette by phone [phone] on 12/14 Disposition:   Status is: Inpatient  Remains inpatient appropriate because: treatment for COVID       Consultants:  None   Subjective:  Patient reports he is feeling better today, he denies any nausea or vomiting, he had bowel movement this morning, light brown in color  Objective: Vitals:   04/30/21 2029 04/30/21 2215 05/01/21 0507 05/01/21 0754  BP:  (!) 148/71 (!) 141/66 (!) 156/63  Pulse:  80 67 63  Resp:  18 16 16   Temp:  98 F (36.7 C) (!) 97.5 F (36.4 C) (!) 97.5 F (36.4 C)  TempSrc:  Oral Oral Oral  SpO2:  92% 90% 94%  Weight: 94.9 kg     Height: 6\' 1"  (1.854 m)       Intake/Output Summary (Last 24 hours) at 05/01/2021 1114 Last data filed at 05/01/2021 0160 Gross per 24 hour  Intake 96.57 ml  Output 750 ml  Net -653.43 ml   Filed Weights   04/29/21 1242 04/30/21 2029  Weight: 92.1 kg 94.9 kg    Examination:  Awake Alert, Oriented X 3, patient is chronically ill-appearing, extremely frail  symmetrical Chest wall movement, Good air movement bilaterally, CTAB RRR,No Gallops,Rubs or new Murmurs, No Parasternal Heave +ve B.Sounds, Abd Soft, No tenderness, suprapubic Foley present, right percutaneous drain present. No Cyanosis, Clubbing or edema, No new Rash or bruise       Data Reviewed: I have personally reviewed following labs and imaging studies  CBC: Recent Labs  Lab 04/29/21 1314 04/30/21 0346  WBC 5.3 3.0*  NEUTROABS 3.4 2.5  HGB 5.8* 6.0*  HCT 17.3* 18.7*  MCV 94.0 96.4  PLT 223 109    Basic Metabolic Panel: Recent Labs  Lab 04/29/21 1314 04/30/21 0346  NA 129* 129*  K 4.1 4.7  CL 100 100  CO2 20* 18*  GLUCOSE 229* 286*  BUN 44* 47*  CREATININE 2.88* 2.76*  CALCIUM 8.0* 7.7*  MG  --  2.3  PHOS  --  4.9*    GFR: Estimated Creatinine Clearance: 20.9 mL/min (A) (by C-G formula based on SCr of  2.76 mg/dL (H)).  Liver Function Tests: Recent Labs  Lab 04/29/21 1314 04/30/21 0346  AST 23 23  ALT 14 15  ALKPHOS 88 90  BILITOT 0.6 1.0  PROT 6.3* 5.9*  ALBUMIN 3.0* 2.7*    CBG: Recent Labs  Lab 04/30/21 1137 04/30/21 1744 04/30/21 2208 05/01/21 0600 05/01/21 0738  GLUCAP 377* 449* 471* 387* 385*     Recent Results (from the past 240 hour(s))  Resp Panel by RT-PCR (Flu A&B, Covid) Nasopharyngeal Swab     Status: Abnormal   Collection Time: 04/29/21  1:16 PM   Specimen: Nasopharyngeal Swab; Nasopharyngeal(NP) swabs in vial transport medium  Result Value Ref Range Status   SARS Coronavirus 2 by RT PCR POSITIVE (A) NEGATIVE Final    Comment: (NOTE) SARS-CoV-2 target nucleic acids are DETECTED.  The SARS-CoV-2 RNA is generally detectable in upper respiratory specimens during the acute phase of infection. Positive results are indicative of  the presence of the identified virus, but do not rule out bacterial infection or co-infection with other pathogens not detected by the test. Clinical correlation with patient history and other diagnostic information is necessary to determine patient infection status. The expected result is Negative.  Fact Sheet for Patients: EntrepreneurPulse.com.au  Fact Sheet for Healthcare Providers: IncredibleEmployment.be  This test is not yet approved or cleared by the [place] FDA and  has been authorized for detection and/or diagnosis of SARS-CoV-2 by FDA under an Emergency Use Authorization (EUA).  This EUA will remain in effect (meaning this test can be used) for the duration of  the COVID-19 declaration under Section 564(b)(1) of the A ct, 21 U.S.C. section 360bbb-3(b)(1), unless the authorization is terminated or revoked sooner.     Influenza A by PCR NEGATIVE NEGATIVE Final   Influenza B by PCR NEGATIVE NEGATIVE Final    Comment: (NOTE) The Xpert Xpress SARS-CoV-2/FLU/RSV plus assay is  intended as an aid in the diagnosis of influenza from Nasopharyngeal swab specimens and should not be used as a sole basis for treatment. Nasal washings and aspirates are unacceptable for Xpert Xpress SARS-CoV-2/FLU/RSV testing.  Fact Sheet for Patients: EntrepreneurPulse.com.au  Fact Sheet for Healthcare Providers: IncredibleEmployment.be  This test is not yet approved or cleared by the [place] FDA and has been authorized for detection and/or diagnosis of SARS-CoV-2 by FDA under an Emergency Use Authorization (EUA). This EUA will remain in effect (meaning this test can be used) for the duration of the COVID-19 declaration under Section 564(b)(1) of the Act, 21 U.S.C. section 360bbb-3(b)(1), unless the authorization is terminated or revoked.  Performed at [facility], [place] [address] [place], [place] [address]   Urine Culture     Status: Abnormal (Preliminary result)   Collection Time: 04/29/21  5:51 PM   Specimen: Urine, Clean Catch  Result Value Ref Range Status   Specimen Description URINE, CLEAN CATCH  Final   Special Requests NONE  Final   Culture (A)  Final    >=100,000 COLONIES/mL PSEUDOMONAS AERUGINOSA SUSCEPTIBILITIES TO FOLLOW Performed at [facility], [place] [address] [place], [place] [address]    Report Status PENDING  Incomplete         Radiology Studies: CT ABDOMEN PELVIS WO CONTRAST  Result Date: 04/29/2021 CLINICAL DATA:  [place] last week, weakness, abdominal pain, cholecystostomy tube EXAM: CT ABDOMEN AND PELVIS WITHOUT CONTRAST TECHNIQUE: Multidetector CT imaging of the abdomen and pelvis was performed following the standard protocol without IV contrast. Unenhanced CT was performed per clinician order. Lack of IV contrast limits sensitivity and specificity, especially for evaluation of abdominal/pelvic solid viscera. COMPARISON:  03/10/2018 FINDINGS: Lower chest: There is a small multilocular left  pleural effusion. Small free-flowing right pleural effusion is identified. Scattered areas of consolidation at the lung bases, most pronounced in the lower lobes, could reflect bronchopneumonia. Heart is enlarged without pericardial effusion. Hepatobiliary: A percutaneous cholecystostomy tube is identified, coiled within the gallbladder fundus. Gallbladder remains mildly distended, with gallbladder wall thickening and pericholecystic fluid consistent with cholecystitis. I do not see any calcified gallstones. There is increased soft tissue density within the subcutaneous portions of the cholecystostomy tube, likely inflammatory. No definite fluid collection on this limited unenhanced exam. There is no intrahepatic biliary duct dilation. Unenhanced imaging of the liver is unremarkable. Pancreas: Unremarkable unenhanced appearance. Spleen: Unremarkable unenhanced appearance. Adrenals/Urinary Tract: Bilateral renal cortical atrophy. No urinary tract calculi or obstructive uropathy. The adrenals are unremarkable. Bladder is decompressed  with a suprapubic catheter. Stomach/Bowel: No bowel obstruction or ileus. Diffuse colonic diverticulosis without diverticulitis. No bowel wall thickening or inflammatory change. Vascular/Lymphatic: Subcentimeter lymph nodes at the porta hepatis are likely reactive. No pathologic adenopathy. There is extensive atherosclerosis throughout the aorta and its branches unchanged. Reproductive: Stable enlargement the prostate. Other: Trace pelvic free fluid. No free intraperitoneal gas. No abdominal wall hernia. Musculoskeletal: No acute or destructive bony lesions. Reconstructed images demonstrate no additional findings. IMPRESSION: 1. Inflammatory changes of the gallbladder consistent with cholecystitis. Indwelling cholecystostomy tube as above. 2. Soft tissue density along the subcutaneous portion of the cholecystostomy tube, which may be inflammatory. No definite fluid collection is observed  on this unenhanced exam. 3. Bibasilar airspace disease and bilateral pleural effusions, consistent with bronchopneumonia. Left pleural effusion is partially loculated. 4. Colonic diverticulosis without diverticulitis. 5. Stable indwelling suprapubic catheter, with decompression of the urinary bladder. 6. Enlarged prostate. 7.  Aortic Atherosclerosis (ICD10-I70.0). Electronically Signed   By: Randa Ngo M.D.   On: 04/29/2021 15:41   DG Chest 2 View  Result Date: 04/29/2021 CLINICAL DATA:  Cough EXAM: CHEST - 2 VIEW COMPARISON:  Previous studies including the examination of 04/16/2018 due to technical difficulties some of the images in the previous studies are not available for review. FINDINGS: Transverse diameter of heart is increased. Thoracic aorta is tortuous and ectatic. There are linear densities in the lower lung fields suggesting scarring or subsegmental atelectasis. Part of this finding may be due to poor inspiration. There is no focal pulmonary consolidation. There are no signs of alveolar pulmonary edema. There is evidence of previous coronary bypass surgery. Patient's chin is partly obscuring the apices. IMPRESSION: Cardiomegaly. Central pulmonary vessels are prominent without signs of alveolar pulmonary edema. There are linear densities in the lower lung fields suggesting scarring or subsegmental atelectasis. Electronically Signed   By: Elmer Picker M.D.   On: 04/29/2021 13:47        Scheduled Meds:  amLODipine  10 mg Oral Q1500   vitamin C  500 mg Oral Daily   aspirin  81 mg Oral Daily   carvedilol  12.5 mg Oral QHS   carvedilol  9.375 mg Oral Daily   darbepoetin (ARANESP) injection - NON-DIALYSIS  150 mcg Subcutaneous Q Wed-1800   docusate sodium  100 mg Oral BID   doxazosin  4 mg Oral Daily   ezetimibe  10 mg Oral Daily   haloperidol lactate  2 mg Intravenous Once   heparin  5,000 Units Subcutaneous Q8H   hydrALAZINE  50 mg Oral Q12H   insulin aspart  0-9 Units  Subcutaneous TID WC   insulin glargine-yfgn  5 Units Subcutaneous Daily   isosorbide mononitrate  60 mg Oral BID   methylPREDNISolone (SOLU-MEDROL) injection  0.5 mg/kg Intravenous Q12H   Followed by   Derrill Memo ON 05/02/2021] predniSONE  50 mg Oral Daily   pantoprazole  40 mg Oral Daily   sodium chloride flush  3 mL Intravenous Q12H   sodium chloride flush  3 mL Intravenous Q12H   zinc sulfate  220 mg Oral Daily   Continuous Infusions:  sodium chloride     remdesivir 100 mg in NS 100 mL Stopped (04/30/21 1222)     LOS: 2 days      Phillips Climes, MD [facility]   To contact the attending provider between 7A-7P or the covering provider during after hours 7P-7A, please log into the web site [email or web address] and access using universal [place] password for  that web site. If you do not have the password, please call the hospital operator.  05/01/2021, 11:14 AM

## 2021-05-01 NOTE — Progress Notes (Signed)
Inpatient Diabetes Program Recommendations  AACE/ADA: New Consensus Statement on Inpatient Glycemic Control  Target Ranges:  Prepandial:   less than 140 mg/dL      Peak postprandial:   less than 180 mg/dL (1-2 hours)      Critically ill patients:  140 - 180 mg/dL    Latest Reference Range & Units 05/01/21 06:00 05/01/21 07:38  Glucose-Capillary 70 - 99 mg/dL 387 (H) 385 (H)    Latest Reference Range & Units 04/30/21 08:19 04/30/21 11:37 04/30/21 17:44 04/30/21 22:08  Glucose-Capillary 70 - 99 mg/dL 273 (H) 377 (H) 449 (H) 471 (H)   Review of Glycemic Control  Diabetes history: DM2 Outpatient Diabetes medications: Amaryl 4 mg QAM, Metformin 500 mg BID Current orders for Inpatient glycemic control: Novolog 0-9 units TID with meals, Semglee 5 units daily  Inpatient Diabetes Program Recommendations:    Insulin: Please consider adding Novolog 0-5 units QHS for bedtime correction.  If steroids are continued, please consider increasing Semglee to 10 units daily and ordering Novolog 5 units TID with meals for meal coverage if patient eats at least 50% of meals.  Thanks, Barnie Alderman, RN, MSN, CDE Diabetes Coordinator Inpatient Diabetes Program [phone] (Team Pager from 8am to 5pm)

## 2021-05-01 NOTE — Plan of Care (Signed)
  Problem: Education: Goal: Knowledge of General Education information will improve Description: Including pain rating scale, medication(s)/side effects and non-pharmacologic comfort measures Outcome: Progressing   Problem: Health Behavior/Discharge Planning: Goal: Ability to manage health-related needs will improve Outcome: Progressing   Problem: Clinical Measurements: Goal: Respiratory complications will improve Outcome: Progressing   

## 2021-05-02 LAB — GLUCOSE, CAPILLARY
Glucose-Capillary: 189 mg/dL — ABNORMAL HIGH (ref 70–99)
Glucose-Capillary: 187 mg/dL — ABNORMAL HIGH (ref 70–99)
Glucose-Capillary: 276 mg/dL — ABNORMAL HIGH (ref 70–99)
Glucose-Capillary: 197 mg/dL — ABNORMAL HIGH (ref 70–99)

## 2021-05-02 NOTE — TOC Initial Note (Signed)
Transition of Care [facility]) - Initial/Assessment Note    Patient Details  Name: [name] MRN: [id number] Date of Birth: [birth year]  Transition of Care [facility]) CM/SW Contact:    Milinda Antis, [place] Phone Number: 05/02/2021, 2:34 PM  Clinical Narrative:                 CSW received consult for possible SNF placement at time of discharge. CSW spoke with patient's daughter.  The patient's daughter expressed understanding of PT recommendation and is agreeable to SNF placement at time of discharge.  She did not have an agency preference. CSW discussed insurance authorization process and will provide Medicare SNF ratings list. Patient has received 2 COVID vaccines.  No further questions reported at this time.   Skilled Nursing Rehab Facilities-   [email or web address]   Ratings out of 5 possible   Name Address  Phone # [place] Inspection Overall  [facility] [address] [place] 5 5 2 4   [facility]  [address] [place], [place] [phone] 4 2 5 5   [facility] [place], [place] 4 1 1 1   [place], [place] 2 2 4 4   [facility] [address] [place] 2 1 1 1   [place] 3 1 4 3   [facility] [address], [place] 5 2 2 3   [facility] [address], [place] 4 1 2 1   [place] [facility] [place] 5 1 2 2   [facility] [phone], [place] [phone] 4 1 1 1   [facility] [address] [facility] [phone] 4 1 2 1   [place] [address] [place] 4 1 1 1       Expected Discharge Plan: Skilled Nursing Facility Barriers to Discharge: Insurance Authorization, SNF Pending bed offer   Patient Goals  and CMS Choice Patient states their goals for this hospitalization and ongoing recovery are:: To receive rehab and return home CMS Medicare.gov Compare Post Acute Care list provided to:: Patient Represenative (must comment) Choice offered to / list presented to : Adult Children  Expected Discharge Plan and Services Expected Discharge Plan: [place] arrangements for the past 2 months: Single Family Home                                      Prior Living Arrangements/Services Living arrangements for the past 2 months: Single Family Home Lives with:: Self Patient language and need for interpreter reviewed:: Yes Do you feel safe going back to the place where you live?: Yes      Need for Family Participation in Patient Care: Yes (Comment) Care giver support system in place?: Yes (comment)   Criminal Activity/Legal Involvement Pertinent to Current Situation/Hospitalization: No - Comment as needed  Activities of Daily Living Home Assistive Devices/Equipment: [email or web address] (specify type) ADL Screening (condition at time of admission) Patient's cognitive ability adequate to safely complete daily activities?: Yes Is the patient deaf or have difficulty hearing?: No Does the patient have difficulty seeing, even when wearing glasses/contacts?: No Does the patient have difficulty concentrating, remembering, or making decisions?: No Patient able to express need for assistance with ADLs?: Yes Does the patient have difficulty dressing  or bathing?: Yes Independently performs ADLs?: No Communication: Independent Dressing (OT): Needs assistance Is this a change from baseline?: Pre-admission baseline Grooming: Needs assistance Is this a change from baseline?: Pre-admission baseline Feeding: Independent Bathing: Needs assistance Is this a change from baseline?: Pre-admission baseline Toileting: Needs assistance Is this a change from baseline?: Pre-admission  baseline In/Out Bed: Needs assistance Is this a change from baseline?: Pre-admission baseline Does the patient have difficulty walking or climbing stairs?: Yes Weakness of Legs: Both Weakness of Arms/Hands: None  Permission Sought/Granted   Permission granted to share information with : Yes, Verbal Permission Granted     Permission granted to share info w AGENCY: SNFs        Emotional Assessment       Orientation: : Oriented to Self, Oriented to Place, Oriented to  Time, Oriented to Situation Alcohol / Substance Use: Not Applicable Psych Involvement: No (comment)  Admission diagnosis:  Chronic cholecystitis [K81.1] AKI (acute kidney injury) ([place]) [N17.9] Anemia, unspecified type [D64.9] Chronic kidney disease, unspecified CKD stage [N18.9] COVID-19 [U07.1] Patient Active Problem List   Diagnosis Date Noted   COVID-19 04/29/2021   DNR (do not resuscitate) 04/29/2021   Accelerated hypertension 11/20/2019   Peripheral arterial disease ([place]) 11/29/2018   Anemia of chronic disease    CKD (chronic kidney disease), stage III ([place])    Poorly controlled type 2 diabetes mellitus with peripheral neuropathy ([place])    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   Vitamin D deficiency 05/27/2018   Acute CVA (cerebrovascular accident) ([place]) 05/25/2018   Right arm weakness 05/25/2018   Right sided weakness    History of CVA with residual deficit    Acute blood loss anemia    Dilated cardiomyopathy ([place]) 04/26/2018   Fatigue 04/26/2018   Leukocytosis 04/17/2018   Benign hypertensive heart disease with diastolic CHF, NYHA class 2 ([place]) 04/17/2018   History of suprapubic catheter 04/17/2018   Chronic anemia 04/17/2018   Chest pain 01/28/2018   Pseudomonas urinary tract infection    Hydrocele, right    Olecranon bursitis of left elbow    CKD (chronic kidney disease) stage 3, GFR 30-59 ml/min (HCC)    Suprapubic catheter ([place])    History of urethral stricture    Hemispheric  carotid artery syndrome 12/22/2016   AKI (acute kidney injury) ([place]) 03/28/2016   S/P angioplasty with stent 03/26/16 to VG to PDA for in-stent restenosis with DES. 03/28/2016   History of ST elevation myocardial infarction (STEMI) 03/28/2016   Carpal tunnel syndrome of right wrist 06/24/2015   Diabetes mellitus treated with oral medication ([place]) 03/21/2015   Stenosis of left carotid artery    Resistant hypertension    Hyperlipidemia LDL goal <70    Cerebral infarction due to stenosis of left middle cerebral artery ([place]) 01/22/2015   CAD S/P percutaneous coronary angioplasty 09/19/2014   Coronary artery disease involving coronary bypass graft of native heart with angina pectoris ([place]) 09/19/2014   Shortness of breath 09/19/2014   Polypharmacy 09/19/2014   Abdominal aortic aneurysm    History of stroke    Hyperlipidemia associated with type 2 diabetes mellitus ([place])    Type II diabetes mellitus with complication ([place])    Coronary artery disease involving native coronary artery of native heart with angina pectoris ([place])    Abnormal TSH 06/19/2014   Obesity 06/19/2014   STEMI (ST elevation myocardial infarction) ([place]) 06/18/2014   NSTEMI (non-ST elevated myocardial infarction) ([place]) 06/17/2014   Bilateral lower extremity edema 03/30/2014  Dizziness 02/21/2014   Bradycardia 08/31/2013   Postoperative atrial fibrillation (HCC)     Class: Temporary   Diabetes mellitus type 2 in obese ([place])    Apnea, sleep    Balanitis xerotica obliterans    History of: Non-STEMI (non-ST elevated myocardial infarction) 11/16/2010    Class: History of   S/P CABG x 06-2010 11/16/2010   PCP:  Suzan Garibaldi, FNP Pharmacy:   [facility] [address], [place] [address] Phone: [phone] Fax: [place], [place] [address] Phone: [phone] Fax:  [phone]  [id number] [place], [place] [address] Phone: [phone] Fax: [phone]     Social Determinants of Health (SDOH) Interventions    Readmission Risk Interventions No flowsheet data found.

## 2021-05-02 NOTE — Progress Notes (Signed)
Physical Therapy Treatment Patient Details Name: [name] MRN: [id number] DOB: 1931/05/13 Today's Date: 05/02/2021   History of Present Illness 87 y.o. male presents to [facility] hospital on 04/29/2021 with cough and weakness. Pt recently admitted at [facility] 10/29-11/4 with acute cholecystitis. Since discharge has been unable to ambulate due to weakness. Pt found to be COVID+ and anemic with Hgb of 6.0. PMH includes AAA; OSA; CAD s/p CABG; CVA; chronic combined CHF; DM; HTN; indwelling foley; HLD; Jehovah's witness.    PT Comments    Pt making steady progress with mobility. Pt lives alone and needs to be independent prior to return there which is why SNF is recommended despite AM-PAC score of 17. Pt very motivated to work toward independence.    Recommendations for follow up therapy are one component of a multi-disciplinary discharge planning process, led by the attending physician.  Recommendations may be updated based on patient status, additional functional criteria and insurance authorization.  Follow Up Recommendations  Skilled nursing-short term rehab (<3 hours/day)     Assistance Recommended at Discharge Intermittent Supervision/Assistance  Equipment Recommendations  None recommended by PT    Recommendations for Other Services       Precautions / Restrictions Precautions Precautions: Fall Precaution Comments: Heparin (pt bleeding from injection site) Restrictions Weight Bearing Restrictions: No     Mobility  Bed Mobility Overal bed mobility: Needs Assistance Bed Mobility: Rolling;Supine to Sit Rolling: Min assist   Supine to sit: Min assist;HOB elevated     General bed mobility comments: Pt up in chair    Transfers Overall transfer level: Needs assistance Equipment used: Rolling walker (2 wheels) Transfers: Sit to/from Stand Sit to Stand: Min assist     Step pivot transfers: Min assist     General transfer comment: Assist to bring hips up and for balance     Ambulation/Gait Ambulation/Gait assistance: Min assist Gait Distance (Feet): 60 Feet Assistive device: Rolling walker (2 wheels) Gait Pattern/deviations: Step-through pattern;Knee flexed in stance - left;Knee flexed in stance - right;Trunk flexed Gait velocity: decr Gait velocity interpretation: <1.8 ft/sec, indicate of risk for recurrent falls   General Gait Details: Assist for balance and support   Stairs             Wheelchair Mobility    Modified Rankin (Stroke Patients Only)       Balance Overall balance assessment: Needs assistance Sitting-balance support: No upper extremity supported;Feet supported Sitting balance-Leahy Scale: Fair     Standing balance support: Bilateral upper extremity supported;Reliant on assistive device for balance Standing balance-Leahy Scale: Poor Standing balance comment: Walker and min guard for static standing                            Cognition Arousal/Alertness: Awake/alert Behavior During Therapy: WFL for tasks assessed/performed Overall Cognitive Status: Within Functional Limits for tasks assessed                                          Exercises      General Comments General comments (skin integrity, edema, etc.): VSS on RA      Pertinent Vitals/Pain Pain Assessment: No/denies pain    Home Living                          Prior Function  PT Goals (current goals can now be found in the care plan section) Acute Rehab PT Goals Patient Stated Goal: to return to independence Progress towards PT goals: Progressing toward goals    Frequency    Min 3X/week      PT Plan Current plan remains appropriate    Co-evaluation              AM-PAC PT "6 Clicks" Mobility   Outcome Measure  Help needed turning from your back to your side while in a flat bed without using bedrails?: A Little Help needed moving from lying on your back to sitting on the side of a  flat bed without using bedrails?: A Little Help needed moving to and from a bed to a chair (including a wheelchair)?: A Little Help needed standing up from a chair using your arms (e.g., wheelchair or bedside chair)?: A Little Help needed to walk in hospital room?: A Little Help needed climbing 3-5 steps with a railing? : A Lot 6 Click Score: 17    End of Session   Activity Tolerance: Patient tolerated treatment well Patient left: in chair;with call bell/phone within reach;with chair alarm set Nurse Communication: Mobility status PT Visit Diagnosis: Other abnormalities of gait and mobility (R26.89);Muscle weakness (generalized) (M62.81)     Time: 2010-0712 PT Time Calculation (min) (ACUTE ONLY): 17 min  Charges:  $Gait Training: 8-22 mins                     [place] Pager [phone] Office [place] 05/02/2021, 3:40 PM

## 2021-05-02 NOTE — Progress Notes (Signed)
PROGRESS NOTE    [name]  ZHG:[id number] DOB: [birth year] DOA: 04/29/2021 PCP: Suzan Garibaldi, FNP    Chief Complaint  Patient presents with   Weakness    Brief Narrative:   [name] is a 87 y.o. male with medical history significant of AAA; OSA; CAD s/p CABG; CVA; chronic combined CHF; DM; HTN; indwelling foley; HLD; Jehovah's witness; and recent cholecystostomy tube placement presenting with cough and generalized weakness.  He was last hospitalized at [facility] from 10/29-11/4 with acute cholecystitis.  Since he is not a surgical candidate due to his generalized conditions as well as anemia with refusal of blood products, he was treated with Zosyn and perc drain placement.  Of note, he previously had choledocholithiasis with biliary sphincterotomy and balloon extraction in 07/2019.  He received IV iron infusion on 10/29 with increase of Hgb from 6.5 to 7.2.  For the last week, he has had cough and worsening fatigue/weakness.  He has been unable to ambulate and his daughter was unable to care for him.  The [facility] aide came today and recommended that the patient be brought in for evaluation. -His work-up in ED was significant for COVID-19 infection, with opacity on imaging, as well with anemia   hemoglobin of 5.8, patient is Jehovah's Witness and does not want any blood product transfusions.     Assessment & Plan:   Principal Problem:   COVID-19 Active Problems:   Postoperative atrial fibrillation (HCC)   Hyperlipidemia associated with type 2 diabetes mellitus (HCC)   CAD S/P percutaneous coronary angioplasty   Diabetes mellitus treated with oral medication (HCC)   AKI (acute kidney injury) ([place])   CKD (chronic kidney disease) stage 3, GFR 30-59 ml/min (HCC)   Suprapubic catheter (HCC)   Anemia of chronic disease   DNR (do not resuscitate)    COVID-19 PNA -Patient with presenting with SOB, cough, and generalized weakness -He is not currently requiring [place] O2 -COVID  POSITIVE -The patient has comorbidities which may increase the risk for ARDS/MODS including: age, HTN, DM, CKD, CAD -Pertinent labs concerning for COVID include normal WBC count; increased BUN/Creatinine -CT chest showed multifocal opacities -Continue  with IV remdesivir. -Given no hypoxia, and his significant hyperglycemia, I did discontinue his steroids 12/15.     Symptomatic anemia with refusal of blood products -Patient with known chronic anemia, refusal of blood products due to religious exception -He is aware that refusal of blood products could precipitate death and actually has a signed paper on hand to attest to that fact -Patient received IV iron and Procrit.   -We will minimize his blood draws to absolute necessity, and if indicated we will try on pediatric tube  AKI on CKD 3b -Baseline GFR is about 30, currently 20 -Avoid hypotension   Recent cholecystitis -CT today with concern for acute cholecystitis but patient does not have any current symptoms, normal LFTs -Continue perc drain without intervention for now   DM Last A1c was 7.7 -hold Glucophage, glimepride -CBG significantly uncontrolled, but this has significantly improved after stopping his steroids.  Continue with Semglee and insulin sliding scale for now.   -While at [facility] he was on glipizide and Jardiance but these appear to have been stopped   HTN -Continue Norvasc, Coreg, hydralazine   CAD -s/p PCI (2019), CABD (2012) -No anginal symptoms currently -Continue ASA, mdur and statin, I will hold his Plavix given no stent over last year, as well his profound anemia and he is on DVT prophylaxis  as well.   HLD -Continue Zetia   Afib -Rate controlled with Coreg -He does not appear to be taking AC for this issue despite prior CVA   Urinary retention -Has h/o balantis xerotica obliterans -Indwelling suprapubic catheter, recently changed       DVT prophylaxis: [place] [employer] heaprin Code Status: DNR Family  Communication: D/W daughter lynnette by phone [phone] on 12/14, she was left voicemail on 12/16 Disposition:   Status is: Inpatient  Remains inpatient appropriate because: treatment for COVID, patient will need SNF placement.       Consultants:  None   Subjective:  He denies any chest pain, shortness of breath, reports just feeling weak, but reports he is feeling better.  Objective: Vitals:   05/01/21 1635 05/01/21 2145 05/02/21 0446 05/02/21 1158  BP: (!) 152/89 (!) 147/65 136/70 135/65  Pulse: 80 (!) 55 72 73  Resp: 18 19 18 16   Temp: 97.6 F (36.4 C) 98.5 F (36.9 C) 97.6 F (36.4 C) 97.7 F (36.5 C)  TempSrc:   Oral   SpO2: 100% 96% 96% 98%  Weight:      Height:        Intake/Output Summary (Last 24 hours) at 05/02/2021 1207 Last data filed at 05/02/2021 0900 Gross per 24 hour  Intake 584 ml  Output 2155 ml  Net -1571 ml   Filed Weights   04/29/21 1242 04/30/21 2029  Weight: 92.1 kg 94.9 kg    Examination:  Awake Alert, Oriented X 3, frail, Symmetrical Chest wall movement, Good air movement bilaterally, CTAB RRR,No Gallops,Rubs or new Murmurs, No Parasternal Heave +ve B.Sounds, Abd Soft, No tenderness, suprapubic Foley present, right percutaneous drain present. No Cyanosis, Clubbing or edema,       Data Reviewed: I have personally reviewed following labs and imaging studies  CBC: Recent Labs  Lab 04/29/21 1314 04/30/21 0346  WBC 5.3 3.0*  NEUTROABS 3.4 2.5  HGB 5.8* 6.0*  HCT 17.3* 18.7*  MCV 94.0 96.4  PLT 223 341    Basic Metabolic Panel: Recent Labs  Lab 04/29/21 1314 04/30/21 0346  NA 129* 129*  K 4.1 4.7  CL 100 100  CO2 20* 18*  GLUCOSE 229* 286*  BUN 44* 47*  CREATININE 2.88* 2.76*  CALCIUM 8.0* 7.7*  MG  --  2.3  PHOS  --  4.9*    GFR: Estimated Creatinine Clearance: 20.9 mL/min (A) (by C-G formula based on SCr of 2.76 mg/dL (H)).  Liver Function Tests: Recent Labs  Lab 04/29/21 1314 04/30/21 0346   AST 23 23  ALT 14 15  ALKPHOS 88 90  BILITOT 0.6 1.0  PROT 6.3* 5.9*  ALBUMIN 3.0* 2.7*    CBG: Recent Labs  Lab 05/01/21 1153 05/01/21 1638 05/01/21 2145 05/02/21 0654 05/02/21 1154  GLUCAP 496* 493* 350* 189* 187*     Recent Results (from the past 240 hour(s))  Resp Panel by RT-PCR (Flu A&B, Covid) Nasopharyngeal Swab     Status: Abnormal   Collection Time: 04/29/21  1:16 PM   Specimen: Nasopharyngeal Swab; Nasopharyngeal(NP) swabs in vial transport medium  Result Value Ref Range Status   SARS Coronavirus 2 by RT PCR POSITIVE (A) NEGATIVE Final    Comment: (NOTE) SARS-CoV-2 target nucleic acids are DETECTED.  The SARS-CoV-2 RNA is generally detectable in upper respiratory specimens during the acute phase of infection. Positive results are indicative of the presence of the identified virus, but do not rule out bacterial infection or co-infection with other pathogens not  detected by the test. Clinical correlation with patient history and other diagnostic information is necessary to determine patient infection status. The expected result is Negative.  Fact Sheet for Patients: EntrepreneurPulse.com.au  Fact Sheet for Healthcare Providers: IncredibleEmployment.be  This test is not yet approved or cleared by the [place] FDA and  has been authorized for detection and/or diagnosis of SARS-CoV-2 by FDA under an Emergency Use Authorization (EUA).  This EUA will remain in effect (meaning this test can be used) for the duration of  the COVID-19 declaration under Section 564(b)(1) of the A ct, 21 U.S.C. section 360bbb-3(b)(1), unless the authorization is terminated or revoked sooner.     Influenza A by PCR NEGATIVE NEGATIVE Final   Influenza B by PCR NEGATIVE NEGATIVE Final    Comment: (NOTE) The Xpert Xpress SARS-CoV-2/FLU/RSV plus assay is intended as an aid in the diagnosis of influenza from Nasopharyngeal swab specimens  and should not be used as a sole basis for treatment. Nasal washings and aspirates are unacceptable for Xpert Xpress SARS-CoV-2/FLU/RSV testing.  Fact Sheet for Patients: EntrepreneurPulse.com.au  Fact Sheet for Healthcare Providers: IncredibleEmployment.be  This test is not yet approved or cleared by the [place] FDA and has been authorized for detection and/or diagnosis of SARS-CoV-2 by FDA under an Emergency Use Authorization (EUA). This EUA will remain in effect (meaning this test can be used) for the duration of the COVID-19 declaration under Section 564(b)(1) of the Act, 21 U.S.C. section 360bbb-3(b)(1), unless the authorization is terminated or revoked.  Performed at [facility], [place] [address] [place], [place] [address]   Urine Culture     Status: Abnormal   Collection Time: 04/29/21  5:51 PM   Specimen: Urine, Clean Catch  Result Value Ref Range Status   Specimen Description URINE, CLEAN CATCH  Final   Special Requests   Final    NONE Performed at [facility], [place] [address] [place], [place] [address]    Culture >=100,000 COLONIES/mL PSEUDOMONAS AERUGINOSA (A)  Final   Report Status 05/01/2021 FINAL  Final   Organism ID, Bacteria PSEUDOMONAS AERUGINOSA (A)  Final      Susceptibility   Pseudomonas aeruginosa - MIC*    CEFTAZIDIME 4 SENSITIVE Sensitive     CIPROFLOXACIN <=0.25 SENSITIVE Sensitive     GENTAMICIN 2 SENSITIVE Sensitive     IMIPENEM 2 SENSITIVE Sensitive     PIP/TAZO 16 SENSITIVE Sensitive     CEFEPIME 4 SENSITIVE Sensitive     * >=100,000 COLONIES/mL PSEUDOMONAS AERUGINOSA         Radiology Studies: No results found.      Scheduled Meds:  amLODipine  10 mg Oral Q1500   vitamin C  500 mg Oral Daily   aspirin  81 mg Oral Daily   carvedilol  12.5 mg Oral QHS   carvedilol  9.375 mg Oral Daily   darbepoetin (ARANESP) injection - NON-DIALYSIS  150 mcg Subcutaneous Q Wed-1800    docusate sodium  100 mg Oral BID   doxazosin  4 mg Oral Daily   ezetimibe  10 mg Oral Daily   heparin  5,000 Units Subcutaneous Q8H   hydrALAZINE  50 mg Oral Q12H   insulin aspart  0-20 Units Subcutaneous TID WC   insulin aspart  0-5 Units Subcutaneous QHS   insulin glargine-yfgn  10 Units Subcutaneous Daily   isosorbide mononitrate  60 mg Oral BID   pantoprazole  40 mg Oral Daily   sodium chloride flush  3 mL Intravenous Q12H   sodium chloride flush  3 mL Intravenous Q12H   zinc sulfate  220 mg Oral Daily   Continuous Infusions:  sodium chloride     remdesivir 100 mg in NS 100 mL 100 mg (05/02/21 0907)     LOS: 3 days      Phillips Climes, MD [facility]   To contact the attending provider between 7A-7P or the covering provider during after hours 7P-7A, please log into the web site [email or web address] and access using universal [place] password for that web site. If you do not have the password, please call the hospital operator.  05/02/2021, 12:07 PM

## 2021-05-02 NOTE — NC FL2 (Signed)
[place] LEVEL OF CARE SCREENING TOOL     IDENTIFICATION  Patient Name: [name] Birthdate: 1930-07-22 Sex: male Admission Date (Current Location): 04/29/2021  [facility] and [place] Number:  Herbalist and Address:  [facility], [place] [address], [place], [place] [address]      Provider Number: [id number]  Attending Physician Name and Address:  Albertine Patricia, MD  Relative Name and Phone Number:  Jeri Modena (Daughter)   [phone]    Current Level of Care: Hospital Recommended Level of Care: [place] Prior Approval Number:    Date Approved/Denied:   PASRR Number: [id number]  Discharge Plan: SNF    Current Diagnoses: Patient Active Problem List   Diagnosis Date Noted   COVID-19 04/29/2021   DNR (do not resuscitate) 04/29/2021   Accelerated hypertension 11/20/2019   Peripheral arterial disease ([place]) 11/29/2018   Anemia of chronic disease    CKD (chronic kidney disease), stage III ([place])    Poorly controlled type 2 diabetes mellitus with peripheral neuropathy ([place])    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   Vitamin D deficiency 05/27/2018   Acute CVA (cerebrovascular accident) ([place]) 05/25/2018   Right arm weakness 05/25/2018   Right sided weakness    History of CVA with residual deficit    Acute blood loss anemia    Dilated cardiomyopathy ([place]) 04/26/2018   Fatigue 04/26/2018   Leukocytosis 04/17/2018   Benign hypertensive heart disease with diastolic CHF, NYHA class 2 ([place]) 04/17/2018   History of suprapubic catheter 04/17/2018   Chronic anemia 04/17/2018   Chest pain 01/28/2018   Pseudomonas urinary tract infection    Hydrocele, right    Olecranon bursitis of left elbow    CKD (chronic kidney disease) stage 3, GFR 30-59 ml/min (HCC)    Suprapubic catheter ([place])    History of urethral stricture    Hemispheric carotid artery syndrome 12/22/2016   AKI (acute kidney  injury) ([place]) 03/28/2016   S/P angioplasty with stent 03/26/16 to VG to PDA for in-stent restenosis with DES. 03/28/2016   History of ST elevation myocardial infarction (STEMI) 03/28/2016   Carpal tunnel syndrome of right wrist 06/24/2015   Diabetes mellitus treated with oral medication ([place]) 03/21/2015   Stenosis of left carotid artery    Resistant hypertension    Hyperlipidemia LDL goal <70    Cerebral infarction due to stenosis of left middle cerebral artery ([place]) 01/22/2015   CAD S/P percutaneous coronary angioplasty 09/19/2014   Coronary artery disease involving coronary bypass graft of native heart with angina pectoris ([place]) 09/19/2014   Shortness of breath 09/19/2014   Polypharmacy 09/19/2014   Abdominal aortic aneurysm    History of stroke    Hyperlipidemia associated with type 2 diabetes mellitus ([place])    Type II diabetes mellitus with complication ([place])    Coronary artery disease involving native coronary artery of native heart with angina pectoris (HCC)    Abnormal TSH 06/19/2014   Obesity 06/19/2014   STEMI (ST elevation myocardial infarction) ([place]) 06/18/2014   NSTEMI (non-ST elevated myocardial infarction) ([place]) 06/17/2014   Bilateral lower extremity edema 03/30/2014   Dizziness 02/21/2014   Bradycardia 08/31/2013   Postoperative atrial fibrillation ([place])    Diabetes mellitus type 2 in obese (HCC)    Apnea, sleep    Balanitis xerotica obliterans    History of: Non-STEMI (non-ST elevated myocardial infarction) 11/16/2010   S/P CABG x 06-2010 11/16/2010    Orientation RESPIRATION  BLADDER Height & Weight     Self, Time, Situation, Place  Normal Incontinent Weight: 209 lb 3.5 oz (94.9 kg) Height:  6\' 1"  (185.4 cm)  BEHAVIORAL SYMPTOMS/MOOD NEUROLOGICAL BOWEL NUTRITION STATUS      Continent Diet (see d/c summary)  AMBULATORY STATUS COMMUNICATION OF NEEDS Skin   Limited Assist Verbally Normal                       Personal Care Assistance Level of Assistance   Bathing, Feeding, Dressing Bathing Assistance: Limited assistance Feeding assistance: Independent Dressing Assistance: Limited assistance     Functional Limitations Info  Sight, Hearing, Speech Sight Info: Impaired Hearing Info: Adequate Speech Info: Adequate    SPECIAL CARE FACTORS FREQUENCY  PT (By licensed PT), OT (By licensed OT)     PT Frequency: 5x/ week OT Frequency: 5x/week            Contractures Contractures Info: Not present    Additional Factors Info  Code Status, Allergies, Insulin Sliding Scale Code Status Info: DNR Allergies Info: Atorvastatin   Crestor (Rosuvastatin)   Other   Pravastatin   Simvastatin   Statins   Insulin Sliding Scale Info: see d/c med list       Current Medications (05/02/2021):  This is the current hospital active medication list Current Facility-Administered Medications  Medication Dose Route Frequency Provider Last Rate Last Admin   0.9 %  sodium chloride infusion  250 mL Intravenous PRN Karmen Bongo, MD       acetaminophen (TYLENOL) tablet 650 mg  650 mg Oral Q6H PRN Karmen Bongo, MD   650 mg at 04/30/21 1037   albuterol (VENTOLIN HFA) 108 (90 Base) MCG/ACT inhaler 2 puff  2 puff Inhalation Q2H PRN Karmen Bongo, MD       amLODipine (NORVASC) tablet 10 mg  10 mg Oral Q1500 Karmen Bongo, MD   10 mg at 05/01/21 1612   ascorbic acid (VITAMIN C) tablet 500 mg  500 mg Oral Daily Karmen Bongo, MD   500 mg at 05/02/21 0900   aspirin chewable tablet 81 mg  81 mg Oral Daily Karmen Bongo, MD   81 mg at 05/02/21 0859   bisacodyl (DULCOLAX) EC tablet 5 mg  5 mg Oral Daily PRN Karmen Bongo, MD       carvedilol (COREG) tablet 12.5 mg  12.5 mg Oral QHS Heloise Purpura, RPH   12.5 mg at 05/01/21 2217   carvedilol (COREG) tablet 9.375 mg  9.375 mg Oral Daily Heloise Purpura, RPH   9.375 mg at 05/02/21 7893   chlorpheniramine-HYDROcodone (TUSSIONEX) 10-8 MG/5ML suspension 5 mL  5 mL Oral Q12H PRN Karmen Bongo, MD        Darbepoetin Alfa (ARANESP) injection 150 mcg  150 mcg Subcutaneous Q Wed-1800 Elgergawy, Silver Huguenin, MD   150 mcg at 04/30/21 1817   docusate sodium (COLACE) capsule 100 mg  100 mg Oral BID Karmen Bongo, MD   100 mg at 05/02/21 0859   doxazosin (CARDURA) tablet 4 mg  4 mg Oral Daily Karmen Bongo, MD   4 mg at 05/02/21 8101   ezetimibe (ZETIA) tablet 10 mg  10 mg Oral Daily Karmen Bongo, MD   10 mg at 05/02/21 0900   guaiFENesin-dextromethorphan (ROBITUSSIN DM) 100-10 MG/5ML syrup 10 mL  10 mL Oral Q4H PRN Karmen Bongo, MD       heparin injection 5,000 Units  5,000 Units Subcutaneous Q8H Karmen Bongo, MD   5,000  Units at 05/02/21 0553   hydrALAZINE (APRESOLINE) tablet 50 mg  50 mg Oral Q12H Karmen Bongo, MD   50 mg at 05/02/21 0859   insulin aspart (novoLOG) injection 0-20 Units  0-20 Units Subcutaneous TID WC Elgergawy, Silver Huguenin, MD   4 Units at 05/02/21 0858   insulin aspart (novoLOG) injection 0-5 Units  0-5 Units Subcutaneous QHS Elgergawy, Silver Huguenin, MD   5 Units at 05/01/21 2216   insulin glargine-yfgn (SEMGLEE) injection 10 Units  10 Units Subcutaneous Daily Elgergawy, Silver Huguenin, MD   10 Units at 05/02/21 0900   isosorbide mononitrate (IMDUR) 24 hr tablet 60 mg  60 mg Oral BID Karmen Bongo, MD   60 mg at 05/02/21 0859   ondansetron (ZOFRAN) tablet 4 mg  4 mg Oral Q6H PRN Karmen Bongo, MD       Or   ondansetron [facility]) injection 4 mg  4 mg Intravenous Q6H PRN Karmen Bongo, MD       oxyCODONE (Oxy IR/ROXICODONE) immediate release tablet 5 mg  5 mg Oral Q4H PRN Karmen Bongo, MD       pantoprazole (PROTONIX) EC tablet 40 mg  40 mg Oral Daily Elgergawy, Silver Huguenin, MD   40 mg at 05/02/21 0859   polyethylene glycol (MIRALAX / GLYCOLAX) packet 17 g  17 g Oral Daily PRN Karmen Bongo, MD       remdesivir 100 mg in sodium chloride 0.9 % 100 mL IVPB  100 mg Intravenous Daily Karmen Bongo, MD 200 mL/hr at 05/02/21 0907 100 mg at 05/02/21 0907   sodium chloride flush (NS)  0.9 % injection 3 mL  3 mL Intravenous Lillia Mountain, MD   3 mL at 05/02/21 0901   sodium chloride flush (NS) 0.9 % injection 3 mL  3 mL Intravenous Lillia Mountain, MD   3 mL at 05/02/21 0901   sodium chloride flush (NS) 0.9 % injection 3 mL  3 mL Intravenous PRN Karmen Bongo, MD   3 mL at 05/01/21 1308   zinc sulfate capsule 220 mg  220 mg Oral Daily Karmen Bongo, MD   220 mg at 05/02/21 1023     Discharge Medications: Please see discharge summary for a list of discharge medications.  Relevant Imaging Results:  Relevant Lab Results:   Additional Information SSN:  [id number], 6'1"  209lbs,   no COVID vaccines listed in the system  Shaniece Bussa F Julyana Woolverton, LCSWA

## 2021-05-02 NOTE — Progress Notes (Addendum)
Occupational Therapy Treatment Patient Details Name: [name] MRN: [id number] DOB: July 12, 1930 Today's Date: 05/02/2021   History of present illness 87 y.o. male presents to [facility] hospital on 04/29/2021 with cough and weakness. Pt recently admitted at [facility] 10/29-11/4 with acute cholecystitis. Since discharge has been unable to ambulate due to weakness. Pt found to be COVID+ and anemic with Hgb of 6.0. PMH includes AAA; OSA; CAD s/p CABG; CVA; chronic combined CHF; DM; HTN; indwelling foley; HLD; Jehovah's witness.   OT comments  Pt making progress with functional goals. Pt in bed upon entering room and agreeable to OOB activity. Upon pulling back bed covers, pt's sheets and gown bloody with blood on abdominal and pubic areas. OT called for pt's RN to come check on pt and one RN arrived, it was determined that heparin injection site was cause of bleeding. OT assisted RN with cleaning up pt with pt rolling to left and right min A, pt able to use bed rails to scoot self to [facility] and a dressing was applied to site of bleeding by RN. RN ok'd pt to get OOB to chair. Pt able to don clean gown, stand with RW and take 3 steps to transfer to [facility] then to chair. OT will continue to follow acutely to maximize level of function and safety   Recommendations for follow up therapy are one component of a multi-disciplinary discharge planning process, led by the attending physician.  Recommendations may be updated based on patient status, additional functional criteria and insurance authorization.    Follow Up Recommendations  Skilled nursing-short term rehab (<3 hours/day)    Assistance Recommended at Discharge Frequent or constant Supervision/Assistance  Equipment Recommendations  Other (comment) (TBD at SNF)    Recommendations for Other Services      Precautions / Restrictions Precautions Precautions: Fall;Other (comment) Precaution Comments: Heparin (pt bleeding from injection site) Restrictions Weight  Bearing Restrictions: No       Mobility Bed Mobility Overal bed mobility: Needs Assistance Bed Mobility: Rolling;Supine to Sit Rolling: Min assist   Supine to sit: Min assist;HOB elevated          Transfers Overall transfer level: Needs assistance Equipment used: Rolling walker (2 wheels) Transfers: Sit to/from Stand;Bed to chair/wheelchair/BSC Sit to Stand: Min assist;From elevated surface   Step pivot transfers: Min assist             Balance Overall balance assessment: Needs assistance Sitting-balance support: No upper extremity supported;Feet supported Sitting balance-Leahy Scale: Fair     Standing balance support: Bilateral upper extremity supported;Reliant on assistive device for balance;During functional activity Standing balance-Leahy Scale: Poor                             ADL either performed or assessed with clinical judgement   ADL Overall ADL's : Needs assistance/impaired     Grooming: Wash/dry hands;Wash/dry face;Min guard;Sitting           Upper Body Dressing : Min guard;Sitting       Toilet Transfer: Minimal assistance;Ambulation;Rolling walker (2 wheels);BSC/3in1;Cueing for safety   Toileting- Clothing Manipulation and Hygiene: Moderate assistance;Sit to/from stand       Functional mobility during ADLs: Minimal assistance;Cueing for safety;Rolling walker (2 wheels)      Extremity/Trunk Assessment Upper Extremity Assessment Upper Extremity Assessment: Generalized weakness   Lower Extremity Assessment Lower Extremity Assessment: Defer to PT evaluation   Cervical / Trunk Assessment Cervical / Trunk Assessment: Kyphotic  Vision Baseline Vision/History: 1 Wears glasses Ability to See in Adequate Light: 0 Adequate Patient Visual Report: No change from baseline     Perception     Praxis      Cognition Arousal/Alertness: Awake/alert Behavior During Therapy: WFL for tasks assessed/performed Overall Cognitive  Status: Within Functional Limits for tasks assessed                                            Exercises     Shoulder Instructions       General Comments      Pertinent Vitals/ Pain       Pain Assessment: No/denies pain  Home Living                                          Prior Functioning/Environment              Frequency  Min 2X/week        Progress Toward Goals  OT Goals(current goals can now be found in the care plan section)  Progress towards OT goals: Progressing toward goals     Plan Discharge plan remains appropriate;Frequency remains appropriate    Co-evaluation                 AM-PAC OT "6 Clicks" Daily Activity     Outcome Measure   Help from another person eating meals?: None Help from another person taking care of personal grooming?: A Little Help from another person toileting, which includes using toliet, bedpan, or urinal?: A Lot Help from another person bathing (including washing, rinsing, drying)?: A Lot Help from another person to put on and taking off regular upper body clothing?: A Little Help from another person to put on and taking off regular lower body clothing?: Total 6 Click Score: 15    End of Session Equipment Utilized During Treatment: Gait belt;Rolling walker (2 wheels)  OT Visit Diagnosis: Unsteadiness on feet (R26.81);Other abnormalities of gait and mobility (R26.89);History of falling (Z91.81);Muscle weakness (generalized) (M62.81)   Activity Tolerance Patient tolerated treatment well   Patient Left in chair;with call bell/phone within reach;with chair alarm set   Nurse Communication          Time: 7169-6789 OT Time Calculation (min): 27 min  Charges: OT General Charges $OT Visit: 1 Visit OT Treatments $Self Care/Home Management : 8-22 mins $Therapeutic Activity: 8-22 mins   Britt Bottom 05/02/2021, 3:19 PM

## 2021-05-02 NOTE — TOC Progression Note (Signed)
Transition of Care [facility]) - Initial/Assessment Note    Patient Details  Name: [name] MRN: [id number] Date of Birth: 15-Jul-1930  [facility]) CM/SW Contact:    Milinda Antis, [place] Phone Number: 05/02/2021, 12:51 PM  Clinical Narrative:                 CSW attempted to contact the patient and patient's daughter to discuss d/c planning to no avail.  CSW will continue to follow.        Patient Goals and CMS Choice        Expected Discharge Plan and Services                                                Prior Living Arrangements/Services                       Activities of Daily Living Home Assistive Devices/Equipment: Gilford Rile (specify type) ADL Screening (condition at time of admission) Patient's cognitive ability adequate to safely complete daily activities?: Yes Is the patient deaf or have difficulty hearing?: No Does the patient have difficulty seeing, even when wearing glasses/contacts?: No Does the patient have difficulty concentrating, remembering, or making decisions?: No Patient able to express need for assistance with ADLs?: Yes Does the patient have difficulty dressing or bathing?: Yes Independently performs ADLs?: No Communication: Independent Dressing (OT): Needs assistance Is this a change from baseline?: Pre-admission baseline Grooming: Needs assistance Is this a change from baseline?: Pre-admission baseline Feeding: Independent Bathing: Needs assistance Is this a change from baseline?: Pre-admission baseline Toileting: Needs assistance Is this a change from baseline?: Pre-admission baseline In/Out Bed: Needs assistance Is this a change from baseline?: Pre-admission baseline Does the patient have difficulty walking or climbing stairs?: Yes Weakness of Legs: Both Weakness of Arms/Hands: None  Permission Sought/Granted                  Emotional Assessment              Admission diagnosis:   Chronic cholecystitis [K81.1] AKI (acute kidney injury) ([place]) [N17.9] Anemia, unspecified type [D64.9] Chronic kidney disease, unspecified CKD stage [N18.9] COVID-19 [U07.1] Patient Active Problem List   Diagnosis Date Noted   COVID-19 04/29/2021   DNR (do not resuscitate) 04/29/2021   Accelerated hypertension 11/20/2019   Peripheral arterial disease ([place]) 11/29/2018   Anemia of chronic disease    CKD (chronic kidney disease), stage III ([place])    Poorly controlled type 2 diabetes mellitus with peripheral neuropathy ([place])    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   Vitamin D deficiency 05/27/2018   Acute CVA (cerebrovascular accident) ([place]) 05/25/2018   Right arm weakness 05/25/2018   Right sided weakness    History of CVA with residual deficit    Acute blood loss anemia    Dilated cardiomyopathy ([place]) 04/26/2018   Fatigue 04/26/2018   Leukocytosis 04/17/2018   Benign hypertensive heart disease with diastolic CHF, NYHA class 2 ([place]) 04/17/2018   History of suprapubic catheter 04/17/2018   Chronic anemia 04/17/2018   Chest pain 01/28/2018   Pseudomonas urinary tract infection    Hydrocele, right    Olecranon bursitis of left elbow    CKD (chronic kidney disease) stage 3, GFR 30-59 ml/min ([place])    Suprapubic catheter ([place])    History  of urethral stricture    Hemispheric carotid artery syndrome 12/22/2016   AKI (acute kidney injury) ([place]) 03/28/2016   S/P angioplasty with stent 03/26/16 to VG to PDA for in-stent restenosis with DES. 03/28/2016   History of ST elevation myocardial infarction (STEMI) 03/28/2016   Carpal tunnel syndrome of right wrist 06/24/2015   Diabetes mellitus treated with oral medication ([place]) 03/21/2015   Stenosis of left carotid artery    Resistant hypertension    Hyperlipidemia LDL goal <70    Cerebral infarction due to stenosis of left middle cerebral artery ([place]) 01/22/2015   CAD S/P percutaneous coronary angioplasty 09/19/2014   Coronary  artery disease involving coronary bypass graft of native heart with angina pectoris ([place]) 09/19/2014   Shortness of breath 09/19/2014   Polypharmacy 09/19/2014   Abdominal aortic aneurysm    History of stroke    Hyperlipidemia associated with type 2 diabetes mellitus ([place])    Type II diabetes mellitus with complication (HCC)    Coronary artery disease involving native coronary artery of native heart with angina pectoris (HCC)    Abnormal TSH 06/19/2014   Obesity 06/19/2014   STEMI (ST elevation myocardial infarction) ([place]) 06/18/2014   NSTEMI (non-ST elevated myocardial infarction) ([place]) 06/17/2014   Bilateral lower extremity edema 03/30/2014   Dizziness 02/21/2014   Bradycardia 08/31/2013   Postoperative atrial fibrillation ([place])     Class: Temporary   Diabetes mellitus type 2 in obese ([place])    Apnea, sleep    Balanitis xerotica obliterans    History of: Non-STEMI (non-ST elevated myocardial infarction) 11/16/2010    Class: History of   S/P CABG x 06-2010 11/16/2010   PCP:  Suzan Garibaldi, FNP Pharmacy:   [facility] [address], [place] [address] Phone: [phone] Fax: [place], [place] [address] Phone: [phone] Fax: [phone]  [id number] - [place], [place] [address] Phone: [phone] Fax: [phone]     Social Determinants of Health (SDOH) Interventions    Readmission Risk Interventions No flowsheet data found.

## 2021-05-03 ENCOUNTER — Encounter (HOSPITAL_COMMUNITY): Payer: Self-pay | Admitting: Internal Medicine

## 2021-05-03 LAB — GLUCOSE, CAPILLARY
Glucose-Capillary: 160 mg/dL — ABNORMAL HIGH (ref 70–99)
Glucose-Capillary: 213 mg/dL — ABNORMAL HIGH (ref 70–99)
Glucose-Capillary: 126 mg/dL — ABNORMAL HIGH (ref 70–99)
Glucose-Capillary: 172 mg/dL — ABNORMAL HIGH (ref 70–99)

## 2021-05-03 NOTE — Progress Notes (Signed)
PROGRESS NOTE    [name]  AUQ:[id number] DOB: December 16, 1930 DOA: 04/29/2021 PCP: Suzan Garibaldi, FNP    Chief Complaint  Patient presents with   Weakness    Brief Narrative:   [name] is a 87 y.o. male with medical history significant of AAA; OSA; CAD s/p CABG; CVA; chronic combined CHF; DM; HTN; indwelling foley; HLD; Jehovah's witness; and recent cholecystostomy tube placement presenting with cough and generalized weakness.  He was last hospitalized at [facility] from 10/29-11/4 with acute cholecystitis.  Since he is not a surgical candidate due to his generalized conditions as well as anemia with refusal of blood products, he was treated with Zosyn and perc drain placement.  Of note, he previously had choledocholithiasis with biliary sphincterotomy and balloon extraction in 07/2019.  He received IV iron infusion on 10/29 with increase of Hgb from 6.5 to 7.2.  For the last week, he has had cough and worsening fatigue/weakness.  He has been unable to ambulate and his daughter was unable to care for him.  The [facility] aide came today and recommended that the patient be brought in for evaluation. -His work-up in ED was significant for COVID-19 infection, with opacity on imaging, as well with anemia   hemoglobin of 5.8, patient is Jehovah's Witness and does not want any blood product transfusions.     Assessment & Plan:   Principal Problem:   COVID-19 Active Problems:   Postoperative atrial fibrillation (HCC)   Hyperlipidemia associated with type 2 diabetes mellitus (HCC)   CAD S/P percutaneous coronary angioplasty   Diabetes mellitus treated with oral medication (HCC)   AKI (acute kidney injury) ([place])   CKD (chronic kidney disease) stage 3, GFR 30-59 ml/min (HCC)   Suprapubic catheter (HCC)   Anemia of chronic disease   DNR (do not resuscitate)     COVID-19 PNA -Patient with presenting with SOB, cough, and generalized weakness -He is not currently requiring [place] O2 -COVID  POSITIVE -The patient has comorbidities which may increase the risk for ARDS/MODS including: age, HTN, DM, CKD, CAD -Pertinent labs concerning for COVID include normal WBC count; increased BUN/Creatinine -CT chest showed multifocal opacities -He was treated with IV remdesivir -Given no hypoxia, and his significant hyperglycemia, I did discontinue his steroids 12/15.     Symptomatic anemia with refusal of blood products -Patient with known chronic anemia, refusal of blood products due to religious exception -He is aware that refusal of blood products could precipitate death and actually has a signed paper on hand to attest to that fact -Patient received IV iron and Procrit.   -Patient is Jehovah's Witness, refusing any blood transfusion, so at this point I will stop doing any blood works as it will not change the management, as he will not be receiving any blood regardless, and iblood daws will only contribute to his anemia  AKI on CKD 3b -Baseline GFR is about 30, currently 20 -Avoid hypotension   Recent cholecystitis -CT today with concern for acute cholecystitis but patient does not have any current symptoms, normal LFTs -Continue perc drain without intervention for now   DM Last A1c was 7.7 -hold Glucophage, glimepride -CBG significantly uncontrolled, but this has significantly improved after stopping his steroids.  Continue with Semglee and insulin sliding scale for now.   -While at [facility] he was on glipizide and Jardiance but these appear to have been stopped   HTN -Continue Norvasc, Coreg, hydralazine   CAD -s/p PCI (2019), CABD (2012) -No anginal symptoms  currently -Continue ASA, mdur and statin, I will hold his Plavix given no stent over last year, as well his profound anemia and he is on DVT prophylaxis as well.   HLD -Continue Zetia   Afib -Rate controlled with Coreg -He does not appear to be taking AC for this issue despite prior CVA   Urinary retention -Has h/o  balantis xerotica obliterans -Indwelling suprapubic catheter, recently changed       DVT prophylaxis:  heaprin Code Status: DNR Family Communication: D/W daughter lynnette by phone [phone] on 12/14, she was left voicemail on 12/16, D/W her by phone 12/17 patient denies any complaints, no significant events overnight as discussed with staff Disposition:   Status is: Inpatient  Patient is medically ready for discharge once SNF bed is available.      Consultants:  None   Subjective:  He denies any chest pain, shortness of breath, reports just feeling weak, but reports he is feeling better.  Objective: Vitals:   05/02/21 1725 05/02/21 2207 05/03/21 0653 05/03/21 0803  BP: 120/74 139/80 (!) 142/89 (!) 152/70  Pulse: 67 60 (!) 48 100  Resp: 18 18 18 19   Temp: 97.9 F (36.6 C) 97.6 F (36.4 C) 98.2 F (36.8 C) 97.8 F (36.6 C)  TempSrc:  Oral  Oral  SpO2: 96% 94% 93% 94%  Weight:      Height:        Intake/Output Summary (Last 24 hours) at 05/03/2021 1302 Last data filed at 05/03/2021 0800 Gross per 24 hour  Intake 657 ml  Output 2200 ml  Net -1543 ml   Filed Weights   04/29/21 1242 04/30/21 2029  Weight: 92.1 kg 94.9 kg    Examination:  Awake Alert, Oriented X 3, frail Symmetrical Chest wall movement, Good air movement bilaterally, CTAB RRR,No Gallops,Rubs or new Murmurs, No Parasternal Heave +ve B.Sounds, Abd Soft, prepubic Foley/right percutaneous drain present No Cyanosis, Clubbing or edema, No new Rash or bruise         Data Reviewed: I have personally reviewed following labs and imaging studies  CBC: Recent Labs  Lab 04/29/21 1314 04/30/21 0346  WBC 5.3 3.0*  NEUTROABS 3.4 2.5  HGB 5.8* 6.0*  HCT 17.3* 18.7*  MCV 94.0 96.4  PLT 223 338    Basic Metabolic Panel: Recent Labs  Lab 04/29/21 1314 04/30/21 0346  NA 129* 129*  K 4.1 4.7  CL 100 100  CO2 20* 18*  GLUCOSE 229* 286*  BUN 44* 47*  CREATININE 2.88* 2.76*  CALCIUM  8.0* 7.7*  MG  --  2.3  PHOS  --  4.9*    GFR: Estimated Creatinine Clearance: 20.9 mL/min (A) (by C-G formula based on SCr of 2.76 mg/dL (H)).  Liver Function Tests: Recent Labs  Lab 04/29/21 1314 04/30/21 0346  AST 23 23  ALT 14 15  ALKPHOS 88 90  BILITOT 0.6 1.0  PROT 6.3* 5.9*  ALBUMIN 3.0* 2.7*    CBG: Recent Labs  Lab 05/02/21 1154 05/02/21 1726 05/02/21 2207 05/03/21 0653 05/03/21 1205  GLUCAP 187* 276* 197* 160* 213*     Recent Results (from the past 240 hour(s))  Resp Panel by RT-PCR (Flu A&B, Covid) Nasopharyngeal Swab     Status: Abnormal   Collection Time: 04/29/21  1:16 PM   Specimen: Nasopharyngeal Swab; Nasopharyngeal(NP) swabs in vial transport medium  Result Value Ref Range Status   SARS Coronavirus 2 by RT PCR POSITIVE (A) NEGATIVE Final    Comment: (NOTE) SARS-CoV-2  target nucleic acids are DETECTED.  The SARS-CoV-2 RNA is generally detectable in upper respiratory specimens during the acute phase of infection. Positive results are indicative of the presence of the identified virus, but do not rule out bacterial infection or co-infection with other pathogens not detected by the test. Clinical correlation with patient history and other diagnostic information is necessary to determine patient infection status. The expected result is Negative.  Fact Sheet for Patients: EntrepreneurPulse.com.au  Fact Sheet for Healthcare Providers: IncredibleEmployment.be  This test is not yet approved or cleared by the [place] FDA and  has been authorized for detection and/or diagnosis of SARS-CoV-2 by FDA under an Emergency Use Authorization (EUA).  This EUA will remain in effect (meaning this test can be used) for the duration of  the COVID-19 declaration under Section 564(b)(1) of the A ct, 21 U.S.C. section 360bbb-3(b)(1), unless the authorization is terminated or revoked sooner.     Influenza A by PCR  NEGATIVE NEGATIVE Final   Influenza B by PCR NEGATIVE NEGATIVE Final    Comment: (NOTE) The Xpert Xpress SARS-CoV-2/FLU/RSV plus assay is intended as an aid in the diagnosis of influenza from Nasopharyngeal swab specimens and should not be used as a sole basis for treatment. Nasal washings and aspirates are unacceptable for Xpert Xpress SARS-CoV-2/FLU/RSV testing.  Fact Sheet for Patients: EntrepreneurPulse.com.au  Fact Sheet for Healthcare Providers: IncredibleEmployment.be  This test is not yet approved or cleared by the [place] FDA and has been authorized for detection and/or diagnosis of SARS-CoV-2 by FDA under an Emergency Use Authorization (EUA). This EUA will remain in effect (meaning this test can be used) for the duration of the COVID-19 declaration under Section 564(b)(1) of the Act, 21 U.S.C. section 360bbb-3(b)(1), unless the authorization is terminated or revoked.  Performed at [facility], [place] [address], [place], [place] [address]   Urine Culture     Status: Abnormal   Collection Time: 04/29/21  5:51 PM   Specimen: Urine, Clean Catch  Result Value Ref Range Status   Specimen Description URINE, CLEAN CATCH  Final   Special Requests   Final    NONE Performed at [facility], [place] [address] [place], [place] [address]    Culture >=100,000 COLONIES/mL PSEUDOMONAS AERUGINOSA (A)  Final   Report Status 05/01/2021 FINAL  Final   Organism ID, Bacteria PSEUDOMONAS AERUGINOSA (A)  Final      Susceptibility   Pseudomonas aeruginosa - MIC*    CEFTAZIDIME 4 SENSITIVE Sensitive     CIPROFLOXACIN <=0.25 SENSITIVE Sensitive     GENTAMICIN 2 SENSITIVE Sensitive     IMIPENEM 2 SENSITIVE Sensitive     PIP/TAZO 16 SENSITIVE Sensitive     CEFEPIME 4 SENSITIVE Sensitive     * >=100,000 COLONIES/mL PSEUDOMONAS AERUGINOSA         Radiology Studies: No results found.      Scheduled Meds:  amLODipine  10 mg Oral  Q1500   vitamin C  500 mg Oral Daily   aspirin  81 mg Oral Daily   carvedilol  12.5 mg Oral QHS   carvedilol  9.375 mg Oral Daily   darbepoetin (ARANESP) injection - NON-DIALYSIS  150 mcg Subcutaneous Q Wed-1800   docusate sodium  100 mg Oral BID   doxazosin  4 mg Oral Daily   ezetimibe  10 mg Oral Daily   heparin  5,000 Units Subcutaneous Q8H   hydrALAZINE  50 mg Oral Q12H   insulin aspart  0-20 Units Subcutaneous TID WC   insulin aspart  0-5 Units Subcutaneous QHS   insulin glargine-yfgn  10 Units Subcutaneous Daily   isosorbide mononitrate  60 mg Oral BID   pantoprazole  40 mg Oral Daily   sodium chloride flush  3 mL Intravenous Q12H   sodium chloride flush  3 mL Intravenous Q12H   zinc sulfate  220 mg Oral Daily   Continuous Infusions:  sodium chloride       LOS: 4 days      Phillips Climes, MD [facility]   To contact the attending provider between 7A-7P or the covering provider during after hours 7P-7A, please log into the web site [email or web address] and access using universal [place] password for that web site. If you do not have the password, please call the hospital operator.  05/03/2021, 1:02 PM

## 2021-05-03 NOTE — TOC Progression Note (Signed)
Transition of Care [facility]) - Progression Note    Patient Details  Name: [name] MRN: [id number] Date of Birth: 02/28/1931  Transition of [facility]) CM/SW Contact  Reece Agar, [place] Phone Number: 05/03/2021, 12:23 PM  Clinical Narrative:    CSW followed up for bed offers, no bed offers available.   Expected Discharge Plan: Skilled Nursing Facility Barriers to Discharge: Ship broker, SNF Pending bed offer  Expected Discharge Plan and Services Expected Discharge Plan: [place] arrangements for the past 2 months: Single Family Home                                       Social Determinants of Health (SDOH) Interventions    Readmission Risk Interventions No flowsheet data found.

## 2021-05-04 LAB — GLUCOSE, CAPILLARY
Glucose-Capillary: 159 mg/dL — ABNORMAL HIGH (ref 70–99)
Glucose-Capillary: 129 mg/dL — ABNORMAL HIGH (ref 70–99)
Glucose-Capillary: 115 mg/dL — ABNORMAL HIGH (ref 70–99)
Glucose-Capillary: 134 mg/dL — ABNORMAL HIGH (ref 70–99)

## 2021-05-04 MED ORDER — HALOPERIDOL LACTATE 5 MG/ML IJ SOLN
2.0000 mg | Freq: Four times a day (QID) | INTRAMUSCULAR | Status: DC | PRN
Start: 2021-05-04 — End: 2021-05-04

## 2021-05-04 NOTE — Progress Notes (Signed)
PROGRESS NOTE    [name]  WCH:[id number] DOB: [birth year] DOA: 04/29/2021 PCP: Suzan Garibaldi, FNP    Chief Complaint  Patient presents with   Weakness    Brief Narrative:   [name] is a 87 y.o. male with medical history significant of AAA; OSA; CAD s/p CABG; CVA; chronic combined CHF; DM; HTN; indwelling foley; HLD; Jehovah's witness; and recent cholecystostomy tube placement presenting with cough and generalized weakness.  He was last hospitalized at [facility] from 10/29-11/4 with acute cholecystitis.  Since he is not a surgical candidate due to his generalized conditions as well as anemia with refusal of blood products, he was treated with Zosyn and perc drain placement.  Of note, he previously had choledocholithiasis with biliary sphincterotomy and balloon extraction in 07/2019.  He received IV iron infusion on 10/29 with increase of Hgb from 6.5 to 7.2.  For the last week, he has had cough and worsening fatigue/weakness.  He has been unable to ambulate and his daughter was unable to care for him.  The [facility] aide came today and recommended that the patient be brought in for evaluation. -His work-up in ED was significant for COVID-19 infection, with opacity on imaging, as well with anemia   hemoglobin of 5.8, patient is Jehovah's Witness and does not want any blood product transfusions.     Assessment & Plan:   Principal Problem:   COVID-19 Active Problems:   Postoperative atrial fibrillation (HCC)   Hyperlipidemia associated with type 2 diabetes mellitus (HCC)   CAD S/P percutaneous coronary angioplasty   Diabetes mellitus treated with oral medication (HCC)   AKI (acute kidney injury) ([place])   CKD (chronic kidney disease) stage 3, GFR 30-59 ml/min (HCC)   Suprapubic catheter (HCC)   Anemia of chronic disease   DNR (do not resuscitate)     COVID-19 PNA -Patient with presenting with SOB, cough, and generalized weakness -He is not currently requiring [place] O2 -COVID  POSITIVE -The patient has comorbidities which may increase the risk for ARDS/MODS including: age, HTN, DM, CKD, CAD -Pertinent labs concerning for COVID include normal WBC count; increased BUN/Creatinine -CT chest showed multifocal opacities -He was treated with IV remdesivir -Given no hypoxia, and his significant hyperglycemia, I did discontinue his steroids 12/15.     Symptomatic anemia with refusal of blood products -Patient with known chronic anemia, refusal of blood products due to religious exception -He is aware that refusal of blood products could precipitate death and actually has a signed paper on hand to attest to that fact -Patient received IV iron and Procrit.   -Patient is Jehovah's Witness, refusing any blood transfusion, so at this point I will stop doing any blood works as it will not change the management, as he will not be receiving any blood regardless, and iblood daws will only contribute to his anemia  AKI on CKD 3b -Baseline GFR is about 30, currently 20 -Avoid hypotension   Recent cholecystitis -CT today with concern for acute cholecystitis but patient does not have any current symptoms, normal LFTs -Continue perc drain without intervention for now   DM Last A1c was 7.7 -hold Glucophage, glimepride -CBG significantly uncontrolled, but this has significantly improved after stopping his steroids.  Continue with Semglee and insulin sliding scale for now.   -While at [facility] he was on glipizide and Jardiance but these appear to have been stopped   HTN -Continue Norvasc, Coreg, hydralazine   CAD -s/p PCI (2019), CABD (2012) -No anginal symptoms  currently -Continue ASA, mdur and statin, I will hold his Plavix given no stent over last year, as well his profound anemia and he is on DVT prophylaxis as well.   HLD -Continue Zetia   Afib -Rate controlled with Coreg -He does not appear to be taking AC for this issue despite prior CVA   Urinary retention -Has h/o  balantis xerotica obliterans -Indwelling suprapubic catheter, recently changed       DVT prophylaxis: [place] heaprin Code Status: DNR Family Communication: D/W daughter lynnette by phone [phone] on 12/14, she was left voicemail on 12/16, D/W her by phone 12/17   Disposition:   Status is: Inpatient  Patient is medically ready for discharge once SNF bed is available.      Consultants:  None   Subjective:  Patient denies any complaints today, no chest pain, no shortness of breath.  Objective: Vitals:   05/03/21 1623 05/03/21 2053 05/04/21 0506 05/04/21 0942  BP: 117/69 139/71 137/67 132/64  Pulse: 100 92 69 71  Resp: 19 18 18 17   Temp: 98.2 F (36.8 C) 97.8 F (36.6 C) 98.1 F (36.7 C) 98.3 F (36.8 C)  TempSrc:  Oral Oral Oral  SpO2: 97% 94% 98% 98%  Weight:   88.9 kg   Height:        Intake/Output Summary (Last 24 hours) at 05/04/2021 1125 Last data filed at 05/04/2021 0506 Gross per 24 hour  Intake 240 ml  Output 900 ml  Net -660 ml   Filed Weights   04/29/21 1242 04/30/21 2029 05/04/21 0506  Weight: 92.1 kg 94.9 kg 88.9 kg    Examination:  Awake Alert, Oriented X 3,frail Symmetrical Chest wall movement, Good air movement bilaterally, CTAB RRR,No Gallops,Rubs or new Murmurs, No Parasternal Heave +ve B.Sounds, Abd Soft, No tenderness, No rebound - guarding or rigidity. No Cyanosis, Clubbing or edema, No new Rash or bruise          Data Reviewed: I have personally reviewed following labs and imaging studies  CBC: Recent Labs  Lab 04/29/21 1314 04/30/21 0346  WBC 5.3 3.0*  NEUTROABS 3.4 2.5  HGB 5.8* 6.0*  HCT 17.3* 18.7*  MCV 94.0 96.4  PLT 223 121    Basic Metabolic Panel: Recent Labs  Lab 04/29/21 1314 04/30/21 0346  NA 129* 129*  K 4.1 4.7  CL 100 100  CO2 20* 18*  GLUCOSE 229* 286*  BUN 44* 47*  CREATININE 2.88* 2.76*  CALCIUM 8.0* 7.7*  MG  --  2.3  PHOS  --  4.9*    GFR: Estimated Creatinine Clearance: 20.9  mL/min (A) (by C-G formula based on SCr of 2.76 mg/dL (H)).  Liver Function Tests: Recent Labs  Lab 04/29/21 1314 04/30/21 0346  AST 23 23  ALT 14 15  ALKPHOS 88 90  BILITOT 0.6 1.0  PROT 6.3* 5.9*  ALBUMIN 3.0* 2.7*    CBG: Recent Labs  Lab 05/03/21 0653 05/03/21 1205 05/03/21 1622 05/03/21 2054 05/04/21 0634  GLUCAP 160* 213* 126* 172* 159*     Recent Results (from the past 240 hour(s))  Resp Panel by RT-PCR (Flu A&B, Covid) Nasopharyngeal Swab     Status: Abnormal   Collection Time: 04/29/21  1:16 PM   Specimen: Nasopharyngeal Swab; Nasopharyngeal(NP) swabs in vial transport medium  Result Value Ref Range Status   SARS Coronavirus 2 by RT PCR POSITIVE (A) NEGATIVE Final    Comment: (NOTE) SARS-CoV-2 target nucleic acids are DETECTED.  The SARS-CoV-2 RNA is generally  detectable in upper respiratory specimens during the acute phase of infection. Positive results are indicative of the presence of the identified virus, but do not rule out bacterial infection or co-infection with other pathogens not detected by the test. Clinical correlation with patient history and other diagnostic information is necessary to determine patient infection status. The expected result is Negative.  Fact Sheet for Patients: EntrepreneurPulse.com.au  Fact Sheet for Healthcare Providers: IncredibleEmployment.be  This test is not yet approved or cleared by the [place] FDA and  has been authorized for detection and/or diagnosis of SARS-CoV-2 by FDA under an Emergency Use Authorization (EUA).  This EUA will remain in effect (meaning this test can be used) for the duration of  the COVID-19 declaration under Section 564(b)(1) of the A ct, 21 U.S.C. section 360bbb-3(b)(1), unless the authorization is terminated or revoked sooner.     Influenza A by PCR NEGATIVE NEGATIVE Final   Influenza B by PCR NEGATIVE NEGATIVE Final    Comment: (NOTE) The  Xpert Xpress SARS-CoV-2/FLU/RSV plus assay is intended as an aid in the diagnosis of influenza from Nasopharyngeal swab specimens and should not be used as a sole basis for treatment. Nasal washings and aspirates are unacceptable for Xpert Xpress SARS-CoV-2/FLU/RSV testing.  Fact Sheet for Patients: EntrepreneurPulse.com.au  Fact Sheet for Healthcare Providers: IncredibleEmployment.be  This test is not yet approved or cleared by the [place] FDA and has been authorized for detection and/or diagnosis of SARS-CoV-2 by FDA under an Emergency Use Authorization (EUA). This EUA will remain in effect (meaning this test can be used) for the duration of the COVID-19 declaration under Section 564(b)(1) of the Act, 21 U.S.C. section 360bbb-3(b)(1), unless the authorization is terminated or revoked.  Performed at [facility], [place] [address], [place], [place] [address]   Urine Culture     Status: Abnormal   Collection Time: 04/29/21  5:51 PM   Specimen: Urine, Clean Catch  Result Value Ref Range Status   Specimen Description URINE, CLEAN CATCH  Final   Special Requests   Final    NONE Performed at [facility], [place] [address] [place], [place] [address]    Culture >=100,000 COLONIES/mL PSEUDOMONAS AERUGINOSA (A)  Final   Report Status 05/01/2021 FINAL  Final   Organism ID, Bacteria PSEUDOMONAS AERUGINOSA (A)  Final      Susceptibility   Pseudomonas aeruginosa - MIC*    CEFTAZIDIME 4 SENSITIVE Sensitive     CIPROFLOXACIN <=0.25 SENSITIVE Sensitive     GENTAMICIN 2 SENSITIVE Sensitive     IMIPENEM 2 SENSITIVE Sensitive     PIP/TAZO 16 SENSITIVE Sensitive     CEFEPIME 4 SENSITIVE Sensitive     * >=100,000 COLONIES/mL PSEUDOMONAS AERUGINOSA         Radiology Studies: No results found.      Scheduled Meds:  amLODipine  10 mg Oral Q1500   vitamin C  500 mg Oral Daily   aspirin  81 mg Oral Daily   carvedilol  12.5 mg Oral  QHS   carvedilol  9.375 mg Oral Daily   darbepoetin (ARANESP) injection - NON-DIALYSIS  150 mcg Subcutaneous Q Wed-1800   docusate sodium  100 mg Oral BID   doxazosin  4 mg Oral Daily   ezetimibe  10 mg Oral Daily   heparin  5,000 Units Subcutaneous Q8H   hydrALAZINE  50 mg Oral Q12H   insulin aspart  0-20 Units Subcutaneous TID WC   insulin aspart  0-5  Units Subcutaneous QHS   insulin glargine-yfgn  10 Units Subcutaneous Daily   isosorbide mononitrate  60 mg Oral BID   pantoprazole  40 mg Oral Daily   sodium chloride flush  3 mL Intravenous Q12H   sodium chloride flush  3 mL Intravenous Q12H   zinc sulfate  220 mg Oral Daily   Continuous Infusions:  sodium chloride       LOS: 5 days      Phillips Climes, MD [facility]   To contact the attending provider between 7A-7P or the covering provider during after hours 7P-7A, please log into the web site [email or web address] and access using universal [place] password for that web site. If you do not have the password, please call the hospital operator.  05/04/2021, 11:25 AM

## 2021-05-05 ENCOUNTER — Inpatient Hospital Stay (HOSPITAL_COMMUNITY): Payer: Medicare Other

## 2021-05-05 DIAGNOSIS — G9341 Metabolic encephalopathy: Secondary | ICD-10-CM

## 2021-05-05 DIAGNOSIS — G459 Transient cerebral ischemic attack, unspecified: Secondary | ICD-10-CM

## 2021-05-05 DIAGNOSIS — I639 Cerebral infarction, unspecified: Secondary | ICD-10-CM

## 2021-05-05 LAB — ECHOCARDIOGRAM LIMITED
Calc EF: 52 %
Height: 73 in
S' Lateral: 4 cm
Single Plane A2C EF: 51 %
Single Plane A4C EF: 56.2 %
Weight: 3135.82 oz

## 2021-05-05 LAB — PROTIME-INR
INR: 1.3 — ABNORMAL HIGH (ref 0.8–1.2)
Prothrombin Time: 16.6 seconds — ABNORMAL HIGH (ref 11.4–15.2)

## 2021-05-05 LAB — GLUCOSE, CAPILLARY
Glucose-Capillary: 129 mg/dL — ABNORMAL HIGH (ref 70–99)
Glucose-Capillary: 141 mg/dL — ABNORMAL HIGH (ref 70–99)
Glucose-Capillary: 180 mg/dL — ABNORMAL HIGH (ref 70–99)
Glucose-Capillary: 282 mg/dL — ABNORMAL HIGH (ref 70–99)

## 2021-05-05 LAB — BASIC METABOLIC PANEL
Anion gap: 11 (ref 5–15)
BUN: 53 mg/dL — ABNORMAL HIGH (ref 8–23)
CO2: 20 mmol/L — ABNORMAL LOW (ref 22–32)
Calcium: 8.5 mg/dL — ABNORMAL LOW (ref 8.9–10.3)
Chloride: 109 mmol/L (ref 98–111)
Creatinine, Ser: 2.15 mg/dL — ABNORMAL HIGH (ref 0.61–1.24)
GFR, Estimated: 29 mL/min — ABNORMAL LOW (ref >60)
Glucose, Bld: 143 mg/dL — ABNORMAL HIGH (ref 70–99)
Potassium: 4.7 mmol/L (ref 3.5–5.1)
Sodium: 140 mmol/L (ref 135–145)

## 2021-05-05 LAB — LIPID PANEL
Cholesterol: 82 mg/dL (ref 0–200)
HDL: 34 mg/dL — ABNORMAL LOW (ref >40)
LDL Cholesterol: 31 mg/dL (ref 0–99)
Total CHOL/HDL Ratio: 2.4 RATIO
Triglycerides: 83 mg/dL (ref <150)
VLDL: 17 mg/dL (ref 0–40)

## 2021-05-05 LAB — CBC
HCT: 18.9 % — ABNORMAL LOW (ref 39.0–52.0)
Hemoglobin: 6.3 g/dL — CL (ref 13.0–17.0)
MCH: 31.3 pg (ref 26.0–34.0)
MCHC: 33.3 g/dL (ref 30.0–36.0)
MCV: 94 fL (ref 80.0–100.0)
Platelets: 520 10*3/uL — ABNORMAL HIGH (ref 150–400)
RBC: 2.01 MIL/uL — ABNORMAL LOW (ref 4.22–5.81)
RDW: 21.4 % — ABNORMAL HIGH (ref 11.5–15.5)
WBC: 7.4 10*3/uL (ref 4.0–10.5)
nRBC: 1.5 % — ABNORMAL HIGH (ref 0.0–0.2)

## 2021-05-05 LAB — HEMOGLOBIN A1C
Hgb A1c MFr Bld: 8.5 % — ABNORMAL HIGH (ref 4.8–5.6)
Mean Plasma Glucose: 197.25 mg/dL

## 2021-05-05 MED ORDER — SODIUM CHLORIDE 0.9 % IV BOLUS: 500.0000 mL | Freq: Once | INTRAVENOUS | Status: AC

## 2021-05-05 MED ORDER — SODIUM CHLORIDE 0.9 % IV SOLN: INTRAVENOUS | Status: DC

## 2021-05-05 MED ORDER — SODIUM CHLORIDE 0.9 % IV BOLUS
500.0000 mL | Freq: Once | INTRAVENOUS | Status: AC
  Administered 2021-05-05: 500 mL via INTRAVENOUS

## 2021-05-05 NOTE — Progress Notes (Signed)
°  Echocardiogram 2D Echocardiogram has been performed.  [name] 05/05/2021, 3:05 PM

## 2021-05-05 NOTE — Progress Notes (Signed)
Reason for Consult: Code stroke Referring Physician: Lucia Bitter  [name] is an 87 y.o. male.  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  He is not a surgical candidate.  He was presently admitted with COVID-19 infection and hemoglobin of 5.8.He is Jehovah's Witness and will refuse any blood product transfusions.  He has been started on steroids and remdesivir. Patient was last seen well by his nurse at 230 today and code stroke was activated at 10 AM when he complained of right sided weakness and confusion.  Nurse states that he was confused and missed his age and month and she noted right and weakness.  Patient daughter subsequently arrived later confirmed the right hand weakness was old finding and not new.  Patient confusion started improving by the time I evaluated the patient emergently is able to name the month and his name correctly there is no drift only mild weakness in the right hand and trace weakness in the right leg but knee pain is well.   NIH stroke scale was only 3 with an unclear last seen normal hence this patient was not considered a candidate for thrombolysis also since clinical exam was not felt to be compatible with LVO CT angio was not emergently considered  Past Medical History:  Diagnosis Date   Abdominal aortic aneurysm    a. Aortic duplex 06/2014: mild aneurysmal dilatation of proximal abdominal aorta measuring 3.4x3.4cm. No sig change from 2012. F/u due 06/2016;    Arthritis    "right knee; never bothered me" (03/26/2016)   Balanitis xerotica obliterans    with meatal stenosis and distal stricture   Carpal tunnel syndrome of  right wrist 06/24/2015   Chronic anemia 04/17/2018   Chronic combined systolic and diastolic CHF (congestive heart failure) ([place])    Coronary artery disease involving coronary bypass graft of native heart with angina pectoris ([place]) 09/19/2014   Cath for Inf STEMI 03/27/16  Conclusions: Significant native coronary artery disease, including 70% ostial LAD stenosis, 50% mid LCx lesion, and 99% proximal RCA disease with TIMI-1 flow (chronic per prior cath reports). Patent LIMA to LAD. Acutely occluded SVG to PDA within previously placed stent. 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 (03/26/2016)   Stenosis of left carotid artery    Suprapubic catheter ([place])    Vitamin D deficiency 05/27/2018    Past Surgical History:  Procedure Laterality Date   APPENDECTOMY     CARDIAC CATHETERIZATION  12/11/2010   Dr. Chase Picket - subsequent cath - normal LV systolic function, no renal artery stenosis, severe 2-vessel disease with subtotaled RCA  prox and distal 60% lesion and complex 70% area of narrowing of ostium of LAD   CARDIAC CATHETERIZATION N/A 03/26/2016   Procedure: Left Heart Cath and Coronary Angiography;  Surgeon: Nelva Bush, MD;  Location: [place] [facility];  Service: Cardiovascular;  Laterality: N/A;   CARDIAC CATHETERIZATION N/A 03/26/2016   Procedure: Coronary Stent Intervention;  Surgeon: Nelva Bush, MD;  Location: [place] [facility];  Service: Cardiovascular: 100% In-stent thrombosis of pros SVG-RCA (Xience DES) --> treated with PromusDES 3.0 x 18 (3.3 mm)   CARDIAC CATHETERIZATION N/A 03/26/2016   Procedure: Bypass Graft Angiography;  Surgeon: Nelva Bush, MD;  Location: [place] [facility];  Service: Cardiovascular;  Laterality: N/A;   CORONARY ANGIOPLASTY WITH STENT PLACEMENT  06/18/2014   PCI to SVG-RPDA 06/18/14 (Xience [place] DES 3.0 x 18 mm -3.35 mm),    CORONARY ARTERY BYPASS GRAFT  12/15/2010   X2, Dr Servando Snare; LIMA to LAD, SVG to PDA;    CORONARY BALLOON ANGIOPLASTY N/A 04/18/2018   Procedure: CORONARY BALLOON ANGIOPLASTY;  Surgeon: Leonie Man, MD;  Location: [place] [facility];  Service: Cardiovascular;;; high pressure scoring and noncompliant balloon PTCA of SVG-RCA ISR ostial and proximal   CORONARY/GRAFT ANGIOGRAPHY N/A 08/27/2017   Procedure: CORONARY/GRAFT ANGIOGRAPHY;  Surgeon: Nelva Bush, MD;  Location: [place] [facility];  Service: Cardiovascular;; pLAD 70%, ostD1 50%.  mCx 60%, OM2 80%. pRCA 95% & mRCA 100% - rPDA 70%. LIMA-mLAD patent. SVG-rPDA 10% ISR.    CYSTOSCOPY WITH URETHRAL DILATATION     IR 3D INDEPENDENT WKST  10/26/2018   IR ANGIO INTRA EXTRACRAN SEL COM CAROTID INNOMINATE BILAT MOD SED  02/25/2017   IR ANGIO INTRA EXTRACRAN SEL COM CAROTID INNOMINATE BILAT MOD SED  10/19/2017   Dr. Kathee Delton: L Common Carotid - ECA & major branches widely patent. ICA ~20% distal to bulb & 50% in supraclinoid segment. LMCA-distal 1/3 MI ~905 stenosis with post-stenotic dilation into inferior division. ~50% prox Basilar A stenosis @ anterior Inf Cerebellar A. 50% R ICA   IR ANGIO INTRA EXTRACRAN SEL COM CAROTID INNOMINATE UNI L MOD SED  06/06/2018   IR ANGIO INTRA EXTRACRAN SEL COM CAROTID INNOMINATE UNI L MOD SED  10/26/2018   IR ANGIO VERTEBRAL SEL SUBCLAVIAN INNOMINATE UNI L MOD SED  02/25/2017   IR ANGIO VERTEBRAL SEL SUBCLAVIAN INNOMINATE UNI R MOD SED  10/19/2017    IR ANGIO VERTEBRAL SEL VERTEBRAL UNI L MOD SED  10/19/2017   IR GENERIC HISTORICAL  01/21/2016   IR RADIOLOGIST EVAL & MGMT 01/21/2016 [facility]   IR GENERIC HISTORICAL  02/03/2016   IR CATHETER TUBE CHANGE 02/03/2016 Marybelle Killings, MD [facility]INTER[facility]   IR RADIOLOGIST EVAL & MGMT  11/09/2017   LEFT HEART CATH AND CORONARY ANGIOGRAPHY N/A 04/18/2018   Procedure: LEFT HEART CATH AND CORONARY ANGIOGRAPHY;  Surgeon: Leonie Man, MD;  Location: [place] [facility];  Service: Cardiovascular;  Laterality: N/A; stable findings on last cath with exception of 75% in-stent restenosis of SVG-RCA ostial stent treated with PTCA.     LEFT HEART CATHETERIZATION WITH CORONARY ANGIOGRAM N/A 06/18/2014   Procedure: LEFT HEART CATHETERIZATION WITH CORONARY ANGIOGRAM;  Surgeon: Leonie Man, MD;  Location[facility];  Service: Cardiovascular;  -- severe disease of SVG-rPDA   NO PAST SURGERIES     RADIOLOGY WITH ANESTHESIA N/A 01/24/2015   Procedure: STENT ASSISTED ANGIOPLASTY (RADIOLOGY WITH ANESTHESIA);  Surgeon: Luanne Bras, MD;  Location: [place];  Service: Radiology;  Laterality:  N/A;   RADIOLOGY WITH ANESTHESIA N/A 06/06/2018   Procedure: STENTING;  Surgeon: Luanne Bras, MD;  Location: [place];  Service: Radiology;  Laterality: N/A;   RADIOLOGY WITH ANESTHESIA N/A 10/26/2018   Procedure: RADIOLOGY WITH ANESTHESIA;  Surgeon: Luanne Bras, MD;  Location: [place];  Service: Radiology;  Laterality: N/A;   TONSILLECTOMY     TRANSTHORACIC ECHOCARDIOGRAM  08/2017; 04/2018:    A) EF 60-65%. Mild LVH. No RWMA. Gr 1 DD. Mod LA dilation.;  B)  EF 35-40%.  Severe LVH.  GRII DD.  Apical anteroseptal hypokinesis.  Akinesis of the apex.    Family History  Problem Relation Age of Onset   Heart Problems Father 77    Social History:  reports that he quit smoking about 62 years ago. His smoking use included cigarettes. He has a 3.30 pack-year smoking history. He has never used smokeless tobacco. He reports that he  does not drink alcohol and does not use drugs.  Allergies:  Allergies  Allergen Reactions   Atorvastatin Other (See Comments)    Myalgias   Crestor [Rosuvastatin] Other (See Comments)    Myalgias    Other Other (See Comments)    No  BLOOD PRODUCTS - Pt is Jehovah's Witness   Pravastatin Other (See Comments)    Myalgias   Simvastatin Other (See Comments)    Myalgias    Statins Other (See Comments)    Cause muscle aches    Medications: I have reviewed the patient's current medications.  Results for orders placed or performed during the hospital encounter of 04/29/21 (from the past 48 hour(s))  Glucose, capillary     Status: Abnormal   Collection Time: 05/03/21  4:22 PM  Result Value Ref Range   Glucose-Capillary 126 (H) 70 - 99 mg/dL    Comment: Glucose reference range applies only to samples taken after fasting for at least 8 hours.  Glucose, capillary     Status: Abnormal   Collection Time: 05/03/21  8:54 PM  Result Value Ref Range   Glucose-Capillary 172 (H) 70 - 99 mg/dL    Comment: Glucose reference range applies only to samples taken after fasting for at least 8 hours.  Glucose, capillary     Status: Abnormal   Collection Time: 05/04/21  6:34 AM  Result Value Ref Range   Glucose-Capillary 159 (H) 70 - 99 mg/dL    Comment: Glucose reference range applies only to samples taken after fasting for at least 8 hours.  Glucose, capillary     Status: Abnormal   Collection Time: 05/04/21 12:04 PM  Result Value Ref Range   Glucose-Capillary 129 (H) 70 - 99 mg/dL    Comment: Glucose reference range applies only to samples taken after fasting for at least 8 hours.  Glucose, capillary     Status: Abnormal   Collection Time: 05/04/21  5:24 PM  Result Value Ref Range   Glucose-Capillary 115 (H) 70 - 99 mg/dL    Comment: Glucose reference range applies only to samples taken after fasting for at least 8 hours.  Glucose, capillary     Status: Abnormal   Collection Time: 05/04/21   9:38 PM  Result Value Ref Range   Glucose-Capillary 134 (H) 70 - 99 mg/dL    Comment: Glucose reference range applies only to samples taken after fasting for at least 8 hours.  Glucose, capillary     Status: Abnormal   Collection Time: 05/05/21  7:30 AM  Result Value Ref Range  Glucose-Capillary 129 (H) 70 - 99 mg/dL    Comment: Glucose reference range applies only to samples taken after fasting for at least 8 hours.  Basic metabolic panel     Status: Abnormal   Collection Time: 05/05/21 10:08 AM  Result Value Ref Range   Sodium 140 135 - 145 mmol/L   Potassium 4.7 3.5 - 5.1 mmol/L   Chloride 109 98 - 111 mmol/L   CO2 20 (L) 22 - 32 mmol/L   Glucose, Bld 143 (H) 70 - 99 mg/dL    Comment: Glucose reference range applies only to samples taken after fasting for at least 8 hours.   BUN 53 (H) 8 - 23 mg/dL   Creatinine, Ser 2.15 (H) 0.61 - 1.24 mg/dL   Calcium 8.5 (L) 8.9 - 10.3 mg/dL   GFR, Estimated 29 (L) >60 mL/min    Comment: (NOTE) Calculated using the CKD-EPI Creatinine Equation (2021)    Anion gap 11 5 - 15    Comment: Performed at [facility] [address] [place], [place] [address]  CBC     Status: Abnormal   Collection Time: 05/05/21 10:08 AM  Result Value Ref Range   WBC 7.4 4.0 - 10.5 K/uL   RBC 2.01 (L) 4.22 - 5.81 MIL/uL   Hemoglobin 6.3 (LL) 13.0 - 17.0 g/dL    Comment: REPEATED TO VERIFY THIS CRITICAL RESULT HAS VERIFIED AND BEEN CALLED TO K.FOSTER RN BY KATHERINE MCCORMICK ON 12 19 2022 AT [phone], AND HAS BEEN READ BACK.     HCT 18.9 (L) 39.0 - 52.0 %   MCV 94.0 80.0 - 100.0 fL   MCH 31.3 26.0 - 34.0 pg   MCHC 33.3 30.0 - 36.0 g/dL   RDW 21.4 (H) 11.5 - 15.5 %   Platelets 520 (H) 150 - 400 K/uL   nRBC 1.5 (H) 0.0 - 0.2 %    Comment: Performed at [facility] [address] [place], [place] [address]  Protime-INR     Status: Abnormal   Collection Time: 05/05/21 10:08 AM  Result Value Ref Range   Prothrombin Time 16.6 (H) 11.4 - 15.2 seconds    INR 1.3 (H) 0.8 - 1.2    Comment: (NOTE) INR goal varies based on device and disease states. Performed at [facility], [place] [address], [place], [place] [address]   Glucose, capillary     Status: Abnormal   Collection Time: 05/05/21  1:10 PM  Result Value Ref Range   Glucose-Capillary 141 (H) 70 - 99 mg/dL    Comment: Glucose reference range applies only to samples taken after fasting for at least 8 hours.    CT HEAD CODE STROKE WO CONTRAST  Result Date: 05/05/2021 CLINICAL DATA:  Code stroke. Neuro deficit, acute, stroke suspected. Right-sided weakness. EXAM: CT HEAD WITHOUT CONTRAST TECHNIQUE: Contiguous axial images were obtained from the base of the skull through the vertex without intravenous contrast. COMPARISON:  05/24/2018 FINDINGS: Brain: No focal abnormality affects the brainstem or cerebellum. Both cerebral hemispheres show chronic small-vessel ischemic changes of the white matter. 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 No sign of acute infarction, mass lesion, hemorrhage, hydrocephalus or extra-axial collection. Vascular: There is atherosclerotic calcification of the major vessels at the base of the brain. Skull: Negative Sinuses/Orbits: Clear/normal Other: None ASPECTS ([place] Stroke Program Early CT Score) - Ganglionic level infarction (caudate, lentiform nuclei, internal capsule, insula, M1-M3 cortex): 7 - Supraganglionic infarction (M4-M6 cortex): 3,  allowing for the old left parietal infarction. Total score (0-10 with 10 being normal): 10, allowing for the old left parietal infarction. IMPRESSION: 1. No acute CT finding. 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 2. ASPECTS is 10, allowing for the old left parietal infarction. 3. These results were communicated to Dr. Leonie Man at 10:03 am on 05/05/2021 by text page via the [facility] messaging system. Electronically Signed   By: Nelson Chimes M.D.   On: 05/05/2021 10:06       ROS Blood pressure 131/71, pulse 86, temperature 98.2 F (36.8 C), temperature source Oral, resp. rate 16, height 6\' 1"  (1.854 m), weight 88.9 kg, SpO2 99 %. Physical Exam Pleasant elderly Caucasian male not in distress. . Afebrile. Head is nontraumatic. Neck is supple without bruit.    Cardiac exam no murmur or gallop. Lungs are clear to auscultation. Distal pulses are well felt.  He is hard of hearing Neurological Exam ;  Awake  Alert oriented x 2.  Diminished attention, registration and recall.  Normal speech and language.eye movements full without nystagmus.fundi were not visualized. Vision acuity and fields appear normal. Hearing is normal. Palatal movements are normal. Face symmetric. Tongue midline. Normal strength, tone, reflexes and coordination.  Except weakness of right grip and intrinsic hand muscles.  Trace right lower extremity drift but this may be due to his knee pain.  Normal sensation. Gait deferred.  1a Level of Conscious.: 0 1b LOC Questions: 2 1c LOC Commands: 0 2 Best Gaze: 0 3 Visual: 0 4 Facial Palsy: 0 5a Motor Arm - left: 0 5b Motor Arm - Right: 0 6a Motor Leg - Left: 0 6b Motor Leg - Right: 1 7 Limb Ataxia: 0 8 Sensory: 0 9 Best Language: 0 10 Dysarthria: 0 11 Extinct. and Inatten.: 0 TOTAL: 3 Assessment/Plan: 87 year old Caucasian male with sudden onset of confusion and right-sided weakness which appears to be improving possibly left hemispheric TIA versus small lacunar infarct.   I personally reviewed emergent CT scan of the head which shows no acute abnormality except old left parietal cortical and subcortical infarct and changes of small vessel disease.  MR angiogram of the brain and MRI of the brain has been ordered but not yet been done. Plan patient is not a candidate for thrombolysis due to mild deficits in unclear last seen normal) candidate for thrombectomy since clinically.  Recommend further evaluation  with checking MRI scan of the brain MRA of the brain carotid ultrasound check lipid profile.  Aspirin for stroke prevention and aggressive risk factor modification. No family available at the bedside for discussion.  Discussed with Dr. Phillips Climes.  I have personally obtained history,examined this patient, reviewed notes, independently viewed imaging studies, participated in medical decision making and plan of care.ROS completed by me personally and pertinent positives fully documented  I have made any additions or clarifications directly to the above note.   Greater than 50% time during this 80-minute consultation visit was spent in counseling and coordination of care about his stroke patient with patient and care team and answering questions. Antony Contras 05/05/2021, 1:29 PM    Note: This document was prepared with digital dictation and possible smart phrase technology. Any transcriptional errors that result from this process are unintentional.

## 2021-05-05 NOTE — Progress Notes (Signed)
Patient noted to have moderate draining on cholecystectomy tube dressing. Dressing changed. Site is red and warm. MD notified.

## 2021-05-05 NOTE — Progress Notes (Signed)
OT Cancellation Note  Patient Details Name: [name] MRN: [id number] DOB: December 07, 1930   Cancelled Treatment:    Reason Eval/Treat Not Completed: Medical issues which prohibited therapy- RN reports calling code stroke and to hold OT today. Will follow and see as able.   Jolaine Artist, OT Acute Rehabilitation Services Pager [phone] Office [phone]   Delight Stare 05/05/2021, 9:28 AM

## 2021-05-05 NOTE — Progress Notes (Signed)
Code Stroke  Patient drowsy but arousable. Noted to haave slurred speech, and right sided weakness. Patient is able to state his name but is disoriented to time, place, and situation. Last neuro assessment 2:30 am charted as WDL. MD notified. Elgergawy came to the bedside to assess patient and Code Stroke was called.   Upon returning from [place] patient's daughter was at the bedside and updated by MD.

## 2021-05-05 NOTE — Code Documentation (Signed)
Stroke Response Nurse Documentation Code Documentation  CAILEAN HEACOCK is a 87 y.o. male admitted to [facility] for [place] along with generalized weakness and cough on 04/29/2021 for home with past medical hx of Afib, HTN, CAD, Stroke, HLP, DM, Vitamin D deficiency. Code stroke was activated by [employer].   Patient from [place] where he was LKW at [place] and now complaining of right hand weakness and confusion. Upon RN initial assessment, she noted patient to be confused about age and month along with some right sided weakness. Patient is from home and normally dependent per RN. After assessment and imaging,daughter arrived and stated that the right hand weakness is his baseline from old stroke and right knee is bad at baseline. Only new symptom at this time is confusion. Daughter reports he has had some intermittent confusion for a couple weeks, but normally able to say name and month.   Stroke team at the bedside after patient activation. Patient to CT with team. NIHSS 3, see documentation for details and code stroke times. Patient with disoriented and dysarthria  on exam. The following imaging was completed: CT. Patient is not a candidate for IV Thrombolytic due to outside window. Patient is not a candidate for IR due to exam not consistent with LVO.   Care/Plan: Q4 NIHSS/VS and MRI/MRA per MD order.   Bedside handoff with RN Urban Gibson.    Kathrin Greathouse  Stroke Response RN

## 2021-05-05 NOTE — Progress Notes (Signed)
Patient with increased drainage at insertion site for percutaneous cholecystostomy drain, discussed with IR Dr. Dwaine Gale , IR to evaluate tomorrow, he recommends abdominal x-ray, and n.p.o. after breakfast.. Phillips Climes MD

## 2021-05-05 NOTE — Progress Notes (Signed)
PROGRESS NOTE    [name]  OBS:[id number] DOB: [birth year] DOA: 04/29/2021 PCP: Suzan Garibaldi, FNP    Chief Complaint  Patient presents with   Weakness    Brief Narrative:   [name] is a 87 y.o. male with medical history significant of AAA; OSA; CAD s/p CABG; CVA; chronic combined CHF; DM; HTN; indwelling foley; HLD; Jehovah's witness; and recent cholecystostomy tube placement presenting with cough and generalized weakness.  He was last hospitalized at [facility] from 10/29-11/4 with acute cholecystitis.  Since he is not a surgical candidate due to his generalized conditions as well as anemia with refusal of blood products, he was treated with Zosyn and perc drain placement.  Of note, he previously had choledocholithiasis with biliary sphincterotomy and balloon extraction in 07/2019.  He received IV iron infusion on 10/29 with increase of Hgb from 6.5 to 7.2.  For the last week, he has had cough and worsening fatigue/weakness.  He has been unable to ambulate and his daughter was unable to care for him.  The [facility] aide came today and recommended that the patient be brought in for evaluation. -His work-up in ED was significant for COVID-19 infection, with opacity on imaging, as well with anemia   hemoglobin of 5.8, patient is Jehovah's Witness and does not want any blood product transfusions. -Was started on steroids and remdesivir, no oxygen requirement, steroids were discontinued, he finished his remdesivir with no respiratory symptoms, awaiting SNF placement. -This a.m. patient had code stroke called as he was noted to be encephalopathic with worsening right-sided weakness.     Assessment & Plan:   Principal Problem:   COVID-19 Active Problems:   Postoperative atrial fibrillation (HCC)   Hyperlipidemia associated with type 2 diabetes mellitus (HCC)   CAD S/P percutaneous coronary angioplasty   Diabetes mellitus treated with oral medication (HCC)   AKI (acute kidney injury) ([place])    CKD (chronic kidney disease) stage 3, GFR 30-59 ml/min (HCC)   Suprapubic catheter (HCC)   Anemia of chronic disease   DNR (do not resuscitate)     COVID-19 PNA -Patient with presenting with SOB, cough, and generalized weakness -He is not currently requiring [place] O2 -COVID POSITIVE -The patient has comorbidities which may increase the risk for ARDS/MODS including: age, HTN, DM, CKD, CAD -Pertinent labs concerning for COVID include normal WBC count; increased BUN/Creatinine -CT chest showed multifocal opacities -He was treated with IV remdesivir -Given no hypoxia, and his significant hyperglycemia, I did discontinue his steroids 12/15.    Acute encephalopathy/rule out CVA -This a.m. patient is more lethargic, confused, with progressive right-sided weakness(he is with some residual minimal right hand weakness status post CVA in the past). -Neurology input greatly appreciated, CT head with no acute findings, not a candidate for tPA, follow with stroke work-up including MRI/MRA.   Symptomatic anemia with refusal of blood products -Patient with known chronic anemia, refusal of blood products due to religious exception -He is aware that refusal of blood products could precipitate death and actually has a signed paper on hand to attest to that fact -Patient received IV iron and Procrit.   -Patient is Jehovah's Witness, refusing any blood transfusion, so at this point I will stop doing any blood works as it will not change the management, as he will not be receiving any blood regardless, and iblood daws will only contribute to his anemia  AKI on CKD 3b -Baseline GFR is about 30, currently 20 -Avoid hypotension -Continue improving, it is  2.1 today.   Recent cholecystitis -CT today with concern for acute cholecystitis but patient does not have any current symptoms, normal LFTs -Continue perc drain without intervention for now   DM Last A1c was 7.7 -hold Glucophage, glimepride -CBG  significantly uncontrolled, but this has significantly improved after stopping his steroids.  Continue with Semglee and insulin sliding scale for now.   -While at [facility] he was on glipizide and Jardiance but these appear to have been stopped   HTN -Continue Norvasc, Coreg, hydralazine   CAD -s/p PCI (2019), CABD (2012) -No anginal symptoms currently -Continue ASA, mdur and statin, I will hold his Plavix given no stent over last year, as well his profound anemia and he is on DVT prophylaxis as well.   HLD -Continue Zetia   Afib -Rate controlled with Coreg -Patient is high risk for anticoagulation in the setting of his severe/profound anemia, for now continue with aspirin.   Urinary retention -Has h/o balantis xerotica obliterans -Indwelling suprapubic catheter, recently changed   Goals of care: -I have discussed with daughter at bedside today, she confirms patient is DNR, continue with current management, no heroics, gentle medical management, no heroics.   DVT prophylaxis: [place] heaprin Code Status: DNR Family Communication: D/W daughter [name] at bedside today.     Disposition:   Status is: Inpatient  Patient is medically ready for discharge once SNF bed is available.      Consultants:  None   Subjective:  This morning patient is more lethargic and confused, less interactive and communicative. Objective: Vitals:   05/05/21 0646 05/05/21 0900 05/05/21 0942 05/05/21 1144  BP: 133/74 130/70 (!) 141/82 131/71  Pulse: (!) 58  84 86  Resp: 17 (!) 8 14 16   Temp: 98.6 F (37 C) 98.2 F (36.8 C)    TempSrc: Oral Oral    SpO2: 96% 96% 97% 99%  Weight:      Height:        Intake/Output Summary (Last 24 hours) at 05/05/2021 1203 Last data filed at 05/04/2021 2340 Gross per 24 hour  Intake 180 ml  Output 1180 ml  Net -1000 ml   Filed Weights   04/29/21 1242 04/30/21 2029 05/04/21 0506  Weight: 92.1 kg 94.9 kg 88.9 kg    Examination:  Patient is more lethargic,  less responsive today, follows simple commands, but answering incoherently, he is with mild right upper/lower extremity weakness. Symmetrical Chest wall movement, Good air movement bilaterally, CTAB RRR,No Gallops,Rubs or new Murmurs, No Parasternal Heave +ve B.Sounds, Abd Soft, No tenderness, No rebound - guarding or rigidity. No Cyanosis, Clubbing or edema, No new Rash or bruise           Data Reviewed: I have personally reviewed following labs and imaging studies  CBC: Recent Labs  Lab 04/29/21 1314 04/30/21 0346 05/05/21 1008  WBC 5.3 3.0* 7.4  NEUTROABS 3.4 2.5  --   HGB 5.8* 6.0* 6.3*  HCT 17.3* 18.7* 18.9*  MCV 94.0 96.4 94.0  PLT 223 230 520*    Basic Metabolic Panel: Recent Labs  Lab 04/29/21 1314 04/30/21 0346 05/05/21 1008  NA 129* 129* 140  K 4.1 4.7 4.7  CL 100 100 109  CO2 20* 18* 20*  GLUCOSE 229* 286* 143*  BUN 44* 47* 53*  CREATININE 2.88* 2.76* 2.15*  CALCIUM 8.0* 7.7* 8.5*  MG  --  2.3  --   PHOS  --  4.9*  --     GFR: Estimated Creatinine Clearance: 26.8  mL/min (A) (by C-G formula based on SCr of 2.15 mg/dL (H)).  Liver Function Tests: Recent Labs  Lab 04/29/21 1314 04/30/21 0346  AST 23 23  ALT 14 15  ALKPHOS 88 90  BILITOT 0.6 1.0  PROT 6.3* 5.9*  ALBUMIN 3.0* 2.7*    CBG: Recent Labs  Lab 05/04/21 0634 05/04/21 1204 05/04/21 1724 05/04/21 2138 05/05/21 0730  GLUCAP 159* 129* 115* 134* 129*     Recent Results (from the past 240 hour(s))  Resp Panel by RT-PCR (Flu A&B, Covid) Nasopharyngeal Swab     Status: Abnormal   Collection Time: 04/29/21  1:16 PM   Specimen: Nasopharyngeal Swab; Nasopharyngeal(NP) swabs in vial transport medium  Result Value Ref Range Status   SARS Coronavirus 2 by RT PCR POSITIVE (A) NEGATIVE Final    Comment: (NOTE) SARS-CoV-2 target nucleic acids are DETECTED.  The SARS-CoV-2 RNA is generally detectable in upper respiratory specimens during the acute phase of infection. Positive results  are indicative of the presence of the identified virus, but do not rule out bacterial infection or co-infection with other pathogens not detected by the test. Clinical correlation with patient history and other diagnostic information is necessary to determine patient infection status. The expected result is Negative.  Fact Sheet for Patients: EntrepreneurPulse.com.au  Fact Sheet for Healthcare Providers: IncredibleEmployment.be  This test is not yet approved or cleared by the [place] FDA and  has been authorized for detection and/or diagnosis of SARS-CoV-2 by FDA under an Emergency Use Authorization (EUA).  This EUA will remain in effect (meaning this test can be used) for the duration of  the COVID-19 declaration under Section 564(b)(1) of the A ct, 21 U.S.C. section 360bbb-3(b)(1), unless the authorization is terminated or revoked sooner.     Influenza A by PCR NEGATIVE NEGATIVE Final   Influenza B by PCR NEGATIVE NEGATIVE Final    Comment: (NOTE) The Xpert Xpress SARS-CoV-2/FLU/RSV plus assay is intended as an aid in the diagnosis of influenza from Nasopharyngeal swab specimens and should not be used as a sole basis for treatment. Nasal washings and aspirates are unacceptable for Xpert Xpress SARS-CoV-2/FLU/RSV testing.  Fact Sheet for Patients: EntrepreneurPulse.com.au  Fact Sheet for Healthcare Providers: IncredibleEmployment.be  This test is not yet approved or cleared by the [place] FDA and has been authorized for detection and/or diagnosis of SARS-CoV-2 by FDA under an Emergency Use Authorization (EUA). This EUA will remain in effect (meaning this test can be used) for the duration of the COVID-19 declaration under Section 564(b)(1) of the Act, 21 U.S.C. section 360bbb-3(b)(1), unless the authorization is terminated or revoked.  Performed at [facility], [place] [address],  [place], [place] [address]   Urine Culture     Status: Abnormal   Collection Time: 04/29/21  5:51 PM   Specimen: Urine, Clean Catch  Result Value Ref Range Status   Specimen Description URINE, CLEAN CATCH  Final   Special Requests   Final    NONE Performed at [facility], [place] [address] [place], [place] [address]    Culture >=100,000 COLONIES/mL PSEUDOMONAS AERUGINOSA (A)  Final   Report Status 05/01/2021 FINAL  Final   Organism ID, Bacteria PSEUDOMONAS AERUGINOSA (A)  Final      Susceptibility   Pseudomonas aeruginosa - MIC*    CEFTAZIDIME 4 SENSITIVE Sensitive     CIPROFLOXACIN <=0.25 SENSITIVE Sensitive     GENTAMICIN 2 SENSITIVE Sensitive     IMIPENEM 2 SENSITIVE Sensitive  PIP/TAZO 16 SENSITIVE Sensitive     CEFEPIME 4 SENSITIVE Sensitive     * >=100,000 COLONIES/mL PSEUDOMONAS AERUGINOSA         Radiology Studies: CT HEAD CODE STROKE WO CONTRAST  Result Date: 05/05/2021 CLINICAL DATA:  Code stroke. Neuro deficit, acute, stroke suspected. Right-sided weakness. EXAM: CT HEAD WITHOUT CONTRAST TECHNIQUE: Contiguous axial images were obtained from the base of the skull through the vertex without intravenous contrast. COMPARISON:  05/24/2018 FINDINGS: Brain: No focal abnormality affects the brainstem or cerebellum. Both cerebral hemispheres show chronic small-vessel ischemic changes of the white matter. 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 No sign of acute infarction, mass lesion, hemorrhage, hydrocephalus or extra-axial collection. Vascular: There is atherosclerotic calcification of the major vessels at the base of the brain. Skull: Negative Sinuses/Orbits: Clear/normal Other: None ASPECTS ([place] Stroke Program Early CT Score) - Ganglionic level infarction (caudate, lentiform nuclei, internal capsule, insula, M1-M3 cortex): 7 - Supraganglionic infarction (M4-M6 cortex): 3, allowing for the old left parietal infarction. Total score (0-10 with 10  being normal): 10, allowing for the old left parietal infarction. IMPRESSION: 1. No acute CT finding. 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 2. ASPECTS is 10, allowing for the old left parietal infarction. 3. These results were communicated to Dr. Leonie Man at 10:03 am on 05/05/2021 by text page via the [facility] messaging system. Electronically Signed   By: Nelson Chimes M.D.   On: 05/05/2021 10:06        Scheduled Meds:  amLODipine  10 mg Oral Q1500   vitamin C  500 mg Oral Daily   aspirin  81 mg Oral Daily   carvedilol  12.5 mg Oral QHS   carvedilol  9.375 mg Oral Daily   darbepoetin (ARANESP) injection - NON-DIALYSIS  150 mcg Subcutaneous Q Wed-1800   docusate sodium  100 mg Oral BID   doxazosin  4 mg Oral Daily   ezetimibe  10 mg Oral Daily   heparin  5,000 Units Subcutaneous Q8H   hydrALAZINE  50 mg Oral Q12H   insulin aspart  0-20 Units Subcutaneous TID WC   insulin aspart  0-5 Units Subcutaneous QHS   insulin glargine-yfgn  10 Units Subcutaneous Daily   isosorbide mononitrate  60 mg Oral BID   pantoprazole  40 mg Oral Daily   sodium chloride flush  3 mL Intravenous Q12H   sodium chloride flush  3 mL Intravenous Q12H   zinc sulfate  220 mg Oral Daily   Continuous Infusions:  sodium chloride     sodium chloride     sodium chloride 50 mL/hr at 05/05/21 1055   sodium chloride       LOS: 6 days      Phillips Climes, MD [facility]   To contact the attending provider between 7A-7P or the covering provider during after hours 7P-7A, please log into the web site [email or web address] and access using universal [place] password for that web site. If you do not have the password, please call the hospital operator.  05/05/2021, 12:03 PM

## 2021-05-06 ENCOUNTER — Inpatient Hospital Stay (HOSPITAL_COMMUNITY): Payer: Medicare Other

## 2021-05-06 DIAGNOSIS — I6330 Cerebral infarction due to thrombosis of unspecified cerebral artery: Secondary | ICD-10-CM | POA: Insufficient documentation

## 2021-05-06 DIAGNOSIS — I639 Cerebral infarction, unspecified: Secondary | ICD-10-CM

## 2021-05-06 DIAGNOSIS — L8990 Pressure ulcer of unspecified site, unspecified stage: Secondary | ICD-10-CM | POA: Insufficient documentation

## 2021-05-06 HISTORY — PX: IR EXCHANGE BILIARY DRAIN: IMG6046

## 2021-05-06 LAB — GLUCOSE, CAPILLARY
Glucose-Capillary: 162 mg/dL — ABNORMAL HIGH (ref 70–99)
Glucose-Capillary: 126 mg/dL — ABNORMAL HIGH (ref 70–99)
Glucose-Capillary: 67 mg/dL — ABNORMAL LOW (ref 70–99)
Glucose-Capillary: 80 mg/dL (ref 70–99)

## 2021-05-06 MED ORDER — CLOPIDOGREL BISULFATE 75 MG PO TABS
75.0000 mg | ORAL_TABLET | Freq: Every day | ORAL | Status: DC
  Administered 2021-05-07 – 2021-05-13 (×7): 75 mg via ORAL
  Filled 2021-05-06 (×7): qty 1

## 2021-05-06 MED ORDER — IOHEXOL 300 MG/ML  SOLN
100.0000 mL | Freq: Once | INTRAMUSCULAR | Status: AC | PRN
  Administered 2021-05-06: 10 mL

## 2021-05-06 MED ORDER — HEPARIN SODIUM (PORCINE) 5000 UNIT/ML IJ SOLN
5000.0000 [IU] | Freq: Three times a day (TID) | INTRAMUSCULAR | Status: DC
  Administered 2021-05-07 – 2021-05-13 (×20): 5000 [IU] via SUBCUTANEOUS
  Filled 2021-05-06 (×19): qty 1

## 2021-05-06 MED ORDER — LIDOCAINE HCL (PF) 1 % IJ SOLN
INTRAMUSCULAR | Status: DC | PRN
  Administered 2021-05-06: 10 mL

## 2021-05-06 MED ORDER — LIDOCAINE HCL 1 % IJ SOLN
INTRAMUSCULAR | Status: AC
  Filled 2021-05-06: qty 20

## 2021-05-06 NOTE — Progress Notes (Signed)
Carotid duplex has been completed.   Preliminary results in CV Proc.   Corrina Steffensen Jacaria Colburn 05/06/2021 10:50 AM

## 2021-05-06 NOTE — Progress Notes (Addendum)
PROGRESS NOTE    [name]  RNH:[id number] DOB: Sep 05, 1930 DOA: 04/29/2021 PCP: Suzan Garibaldi, FNP    Chief Complaint  Patient presents with   Weakness    Brief Narrative:   [name] is a 87 y.o. male with medical history significant of AAA; OSA; CAD s/p CABG; CVA; chronic combined CHF; DM; HTN; indwelling foley; HLD; Jehovah's witness; and recent cholecystostomy tube placement presenting with cough and generalized weakness.  He was last hospitalized at [facility] from 10/29-11/4 with acute cholecystitis.  Since he is not a surgical candidate due to his generalized conditions as well as anemia with refusal of blood products, he was treated with Zosyn and perc drain placement.  Of note, he previously had choledocholithiasis with biliary sphincterotomy and balloon extraction in 07/2019.  He received IV iron infusion on 10/29 with increase of Hgb from 6.5 to 7.2.  For the last week, he has had cough and worsening fatigue/weakness.  He has been unable to ambulate and his daughter was unable to care for him.  The [facility] aide came today and recommended that the patient be brought in for evaluation. -His work-up in ED was significant for COVID-19 infection, with opacity on imaging, as well with anemia   hemoglobin of 5.8, patient is Jehovah's Witness and does not want any blood product transfusions. -Was started on steroids and remdesivir, no oxygen requirement, steroids were discontinued, he finished his remdesivir with no respiratory symptoms, awaiting SNF placement. -12/19 patient had code stroke called as he was noted to be encephalopathic with worsening right-sided weakness.  I significant for small acute CVA.     Assessment & Plan:   Principal Problem:   COVID-19 Active Problems:   Postoperative atrial fibrillation (HCC)   Hyperlipidemia associated with type 2 diabetes mellitus (HCC)   CAD S/P percutaneous coronary angioplasty   Diabetes mellitus treated with oral medication (HCC)   AKI  (acute kidney injury) ([place])   CKD (chronic kidney disease) stage 3, GFR 30-59 ml/min (HCC)   Suprapubic catheter (HCC)   Anemia of chronic disease   DNR (do not resuscitate)   Cerebral thrombosis with cerebral infarction   Pressure injury of skin     COVID-19 PNA -Patient with presenting with SOB, cough, and generalized weakness -He is not currently requiring [place] O2 -COVID POSITIVE -The patient has comorbidities which may increase the risk for ARDS/MODS including: age, HTN, DM, CKD, CAD -Pertinent labs concerning for COVID include normal WBC count; increased BUN/Creatinine -CT chest showed multifocal opacities -He was treated with IV remdesivir -Given no hypoxia, and his significant hyperglycemia, I did discontinue his steroids 12/15.    Acute CVA -12/19 AM patient with increased confusion and lethargy, worsening right-sided weakness, MRI brain significant for acute CVA . -Management per neurology, patient already on aspirin, as discussed with neurology, patient will need aspirin and Plavix for 3 weeks, then Plavix alone , I will resume Plavix from tomorrow after his IR procedure today. -He is with known A. fib, not an anticoagulation due to profound anemia, high risk for anticoagulation, which makes him high risk for recurrent CVAs, I have discussed with the daughter and plan for her fecal situation to fully anticoagulate the patient however his hemoglobin is usually in 5-6 range.   Symptomatic anemia with refusal of blood products -Patient with known chronic anemia, refusal of blood products due to religious exception -He is aware that refusal of blood products could precipitate death and actually has a signed paper on hand to  attest to that fact -Patient received IV iron and Procrit.   -Patient is Jehovah's Witness, refusing any blood transfusion, so at this point I will stop doing any blood works as it will not change the management, as he will not be receiving any blood regardless,  and iblood daws will only contribute to his anemia  AKI on CKD 3b -Baseline GFR is about 30, currently 20 -Avoid hypotension -Continue improving, it is 2.1 today.   Recent cholecystitis/leaking around percutaneous cholecystostomy drain -CT admission with concern for acute cholecystitis but patient does not have any current symptoms, normal LFTs -Patient with leaking around the percutaneous drain insertion site, IR to evaluate today.   DM Last A1c was 7.7 -hold Glucophage, glimepride -CBG significantly uncontrolled, but this has significantly improved after stopping his steroids.  Continue with Semglee and insulin sliding scale for now.   -While at [facility] he was on glipizide and Jardiance but these appear to have been stopped   HTN -Continue Norvasc, Coreg, hydralazine   CAD -s/p PCI (2019), CABD (2012) -No anginal symptoms currently -Continue ASA, mdur and statin, I will hold his Plavix given no stent over last year, as well his profound anemia and he is on DVT prophylaxis as well.   HLD -Continue Zetia   Afib -Rate controlled with Coreg -Patient is high risk for anticoagulation in the setting of his severe/profound anemia, for now continue with aspirin.   Urinary retention -Has h/o balantis xerotica obliterans -Indwelling suprapubic catheter, recently changed   Goals of care: -I have discussed with daughter at bedside today, she confirms patient is DNR, continue with current management, no heroics, gentle medical management, no heroics.   DVT prophylaxis: [place] heaprin Code Status: DNR Family Communication: D/W daughter lynnette by phone today     Disposition:   Status is: Inpatient  Patient is medically ready for discharge once SNF bed is available.      Consultants:  Neurology IR   Subjective:  No significant events overnight as discussed with staff patient was noted to have increased drainage at percutaneous cholecystostomy insertion site, plan for evaluation  by IR today. -Patient himself denies any complaints. Objective: Vitals:   05/05/21 1723 05/05/21 2145 05/06/21 0646 05/06/21 0800  BP: 131/77 139/69 (!) 141/62 137/77  Pulse: 86 72 68 66  Resp: 16 19 18 18   Temp: 97.9 F (36.6 C) 98.5 F (36.9 C) 98.1 F (36.7 C) 98.1 F (36.7 C)  TempSrc:      SpO2: 100% 94% 92% 96%  Weight:      Height:        Intake/Output Summary (Last 24 hours) at 05/06/2021 1531 Last data filed at 05/06/2021 1300 Gross per 24 hour  Intake 514.8 ml  Output 920 ml  Net -405.2 ml   Filed Weights   04/29/21 1242 04/30/21 2029 05/04/21 0506  Weight: 92.1 kg 94.9 kg 88.9 kg    Examination:  More awake and alert today, but he remains confused, following commands and answering some questions  Symmetrical Chest wall movement, Good air movement bilaterally, CTAB RRR,No Gallops,Rubs or new Murmurs, No Parasternal Heave +ve B.Sounds, Abd Soft, No tenderness, right upper quadrant drain No Cyanosis, Clubbing or edema, No new Rash or bruise            Data Reviewed: I have personally reviewed following labs and imaging studies  CBC: Recent Labs  Lab 04/30/21 0346 05/05/21 1008  WBC 3.0* 7.4  NEUTROABS 2.5  --   HGB 6.0* 6.3*  HCT 18.7* 18.9*  MCV 96.4 94.0  PLT 230 520*    Basic Metabolic Panel: Recent Labs  Lab 04/30/21 0346 05/05/21 1008  NA 129* 140  K 4.7 4.7  CL 100 109  CO2 18* 20*  GLUCOSE 286* 143*  BUN 47* 53*  CREATININE 2.76* 2.15*  CALCIUM 7.7* 8.5*  MG 2.3  --   PHOS 4.9*  --     GFR: Estimated Creatinine Clearance: 26.8 mL/min (A) (by C-G formula based on SCr of 2.15 mg/dL (H)).  Liver Function Tests: Recent Labs  Lab 04/30/21 0346  AST 23  ALT 15  ALKPHOS 90  BILITOT 1.0  PROT 5.9*  ALBUMIN 2.7*    CBG: Recent Labs  Lab 05/05/21 1310 05/05/21 1716 05/05/21 2144 05/06/21 0645 05/06/21 1231  GLUCAP 141* 180* 282* 162* 126*     Recent Results (from the past 240 hour(s))  Resp Panel by RT-PCR  (Flu A&B, Covid) Nasopharyngeal Swab     Status: Abnormal   Collection Time: 04/29/21  1:16 PM   Specimen: Nasopharyngeal Swab; Nasopharyngeal(NP) swabs in vial transport medium  Result Value Ref Range Status   SARS Coronavirus 2 by RT PCR POSITIVE (A) NEGATIVE Final    Comment: (NOTE) SARS-CoV-2 target nucleic acids are DETECTED.  The SARS-CoV-2 RNA is generally detectable in upper respiratory specimens during the acute phase of infection. Positive results are indicative of the presence of the identified virus, but do not rule out bacterial infection or co-infection with other pathogens not detected by the test. Clinical correlation with patient history and other diagnostic information is necessary to determine patient infection status. The expected result is Negative.  Fact Sheet for Patients: EntrepreneurPulse.com.au  Fact Sheet for Healthcare Providers: IncredibleEmployment.be  This test is not yet approved or cleared by the [place] FDA and  has been authorized for detection and/or diagnosis of SARS-CoV-2 by FDA under an Emergency Use Authorization (EUA).  This EUA will remain in effect (meaning this test can be used) for the duration of  the COVID-19 declaration under Section 564(b)(1) of the A ct, 21 U.S.C. section 360bbb-3(b)(1), unless the authorization is terminated or revoked sooner.     Influenza A by PCR NEGATIVE NEGATIVE Final   Influenza B by PCR NEGATIVE NEGATIVE Final    Comment: (NOTE) The Xpert Xpress SARS-CoV-2/FLU/RSV plus assay is intended as an aid in the diagnosis of influenza from Nasopharyngeal swab specimens and should not be used as a sole basis for treatment. Nasal washings and aspirates are unacceptable for Xpert Xpress SARS-CoV-2/FLU/RSV testing.  Fact Sheet for Patients: EntrepreneurPulse.com.au  Fact Sheet for Healthcare Providers: IncredibleEmployment.be  This  test is not yet approved or cleared by the [place] FDA and has been authorized for detection and/or diagnosis of SARS-CoV-2 by FDA under an Emergency Use Authorization (EUA). This EUA will remain in effect (meaning this test can be used) for the duration of the COVID-19 declaration under Section 564(b)(1) of the Act, 21 U.S.C. section 360bbb-3(b)(1), unless the authorization is terminated or revoked.  Performed at [facility], [place] [address], [place], [place] [address]   Urine Culture     Status: Abnormal   Collection Time: 04/29/21  5:51 PM   Specimen: Urine, Clean Catch  Result Value Ref Range Status   Specimen Description URINE, CLEAN CATCH  Final   Special Requests   Final    NONE Performed at [facility], [place] [address] [place], [place] [address]    Culture >=  100,000 COLONIES/mL PSEUDOMONAS AERUGINOSA (A)  Final   Report Status 05/01/2021 FINAL  Final   Organism ID, Bacteria PSEUDOMONAS AERUGINOSA (A)  Final      Susceptibility   Pseudomonas aeruginosa - MIC*    CEFTAZIDIME 4 SENSITIVE Sensitive     CIPROFLOXACIN <=0.25 SENSITIVE Sensitive     GENTAMICIN 2 SENSITIVE Sensitive     IMIPENEM 2 SENSITIVE Sensitive     PIP/TAZO 16 SENSITIVE Sensitive     CEFEPIME 4 SENSITIVE Sensitive     * >=100,000 COLONIES/mL PSEUDOMONAS AERUGINOSA         Radiology Studies: MR ANGIO HEAD WO CONTRAST  Result Date: 05/05/2021 CLINICAL DATA:  Provided history: Neuro deficit, acute, stroke suspected. Cough and generalized weakness/COVID. EXAM: MRI HEAD WITHOUT CONTRAST MRA HEAD WITHOUT CONTRAST TECHNIQUE: Multiplanar, multi-echo pulse sequences of the brain and surrounding structures were acquired without intravenous contrast. Angiographic images of the Circle of Willis were acquired using MRA technique without intravenous contrast. COMPARISON:  Noncontrast head CT 05/05/2021. Report from catheter based angiography 06/06/2018. Report from catheter based angiography  10/26/2018 brain MRI 05/24/2018. CT angiogram head/neck 05/24/2018. FINDINGS: MRI HEAD FINDINGS Brain: Intermittently motion degraded examination, limiting evaluation. Most notably, there is severe motion degradation of the sagittal T1 weighted sequence and moderate/severe motion degradation of the coronal T2 TSE sequence. Mild-to-moderate generalized cerebral atrophy. 3 mm focus of restricted diffusion within the posterior left frontal lobe white matter, compatible with acute infarction (series 5, image 42) (series 4, image 19). Redemonstrated chronic cortical/subcortical infarcts within the left frontal and parietal lobes. Additional known small chronic infarcts within the left occipital lobe, posterior left temporal lobe and posterior right frontal lobe were better appreciated on the prior brain MRI of 05/24/2018 (acute at that time). Redemonstrated chronic lacunar infarcts within the bilateral cerebral hemispheric white matter. Redemonstrated circumscribed foci of T2 hyperintensity within the bilateral basal ganglia, likely reflecting a combination of prominent perivascular spaces and chronic lacunar infarcts. Background mild multifocal T2 FLAIR hyperintense signal abnormality within the cerebral white matter and pons, nonspecific but compatible with chronic small vessel ischemic disease. Partially empty sella turcica, a nonspecific finding. No evidence of an intracranial mass. No extra-axial fluid collection. No midline shift. Vascular: Reported below. Skull and upper cervical spine: Within the limitations of motion degradation, no focal suspicious marrow lesion is identified. Sinuses/Orbits: Visualized orbits show no acute finding. Bilateral lens replacements. Trace mucosal thickening within the bilateral frontal sinuses. Moderate mucosal thickening within the bilateral ethmoid sinuses. Trace mucosal thickening within the bilateral sphenoid sinuses. Mild mucosal thickening within the bilateral maxillary  sinuses. Other: Trace fluid within the bilateral mastoid air cells. MRA HEAD FINDINGS The examination is moderately motion degraded, limiting evaluation. Anterior circulation: The intracranial internal carotid arteries are patent. There are sites of moderate stenosis within the cavernous/paraclinoid left ICA. The M1 middle cerebral arteries are patent. Redemonstrated known severe stenosis within the distal M1 segment of the left middle cerebral artery. Atherosclerotic irregularity of the M2 and more distal middle cerebral artery vessels, bilaterally. No M2 proximal branch occlusion is identified. The right anterior cerebral artery is patent. Unchanged from the prior catheter based angiogram of 06/06/2018, the left anterior cerebral artery is occluded at the level of the proximal A2 segment. Within the limitations of motion degradation, no intracranial aneurysm is identified. 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 The intracranial left vertebral artery is patent with no more than mild stenosis. Mild atherosclerotic irregularity of the basilar artery (versus motion artifact).  The posterior cerebral arteries are patent. Apparent severe stenosis within the distal P2 segment of the right PCA, not definitively present on prior examinations. Apparent atherosclerotic irregularity of the left PCA. Most notably, there is apparent progressive moderate/severe stenosis within the P1 left PCA. A right posterior communicating artery is present. The left posterior communicating artery is diminutive or absent. Anatomic variants: As described. IMPRESSION: MRI brain: 1. Motion degraded examination, as described and limiting evaluation. 2. 3 mm acute infarct within the posterior left frontal lobe white matter. 3. Known chronic cortical/subcortical infarcts within the left frontal and parietal lobes. Additional known small chronic infarcts within the left occipital lobe, posterior left  temporal and posterior right frontal lobe were better appreciated on the prior brain MRI of 05/24/2018 (acute at that time). 4. Redemonstrated chronic lacunar infarcts within the bilateral cerebral hemispheric white matter and basal ganglia. 5. Background mild chronic small vessel ischemic changes within the cerebral white matter and pons, stable. 6. Mild-to-moderate generalized cerebral atrophy. 7. Paranasal sinus disease, as described. 8. Trace fluid within the bilateral mastoid air cells. MRA head: 1. Moderately motion degraded examination, limiting evaluation. 2. Intracranial atherosclerotic disease, most notably as follows. 3. 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 4. Redemonstrated sites of moderate stenosis within the cavernous/paraclinoid left ICA. 5. Redemonstrated severe stenosis within the distal M1 segment of the left MCA. 6. Apparent severe stenosis within the distal P2 right PCA, not definitely present on prior exams. 7. Progressive apparent moderate/severe stenosis within the P1 left PCA. Electronically Signed   By: Kellie Simmering D.O.   On: 05/05/2021 13:43   MR BRAIN WO CONTRAST  Result Date: 05/05/2021 CLINICAL DATA:  Provided history: Neuro deficit, acute, stroke suspected. Cough and generalized weakness/COVID. EXAM: MRI HEAD WITHOUT CONTRAST MRA HEAD WITHOUT CONTRAST TECHNIQUE: Multiplanar, multi-echo pulse sequences of the brain and surrounding structures were acquired without intravenous contrast. Angiographic images of the Circle of Willis were acquired using MRA technique without intravenous contrast. COMPARISON:  Noncontrast head CT 05/05/2021. Report from catheter based angiography 06/06/2018. Report from catheter based angiography 10/26/2018 brain MRI 05/24/2018. CT angiogram head/neck 05/24/2018. FINDINGS: MRI HEAD FINDINGS Brain: Intermittently motion degraded examination, limiting  evaluation. Most notably, there is severe motion degradation of the sagittal T1 weighted sequence and moderate/severe motion degradation of the coronal T2 TSE sequence. Mild-to-moderate generalized cerebral atrophy. 3 mm focus of restricted diffusion within the posterior left frontal lobe white matter, compatible with acute infarction (series 5, image 42) (series 4, image 19). Redemonstrated chronic cortical/subcortical infarcts within the left frontal and parietal lobes. Additional known small chronic infarcts within the left occipital lobe, posterior left temporal lobe and posterior right frontal lobe were better appreciated on the prior brain MRI of 05/24/2018 (acute at that time). Redemonstrated chronic lacunar infarcts within the bilateral cerebral hemispheric white matter. Redemonstrated circumscribed foci of T2 hyperintensity within the bilateral basal ganglia, likely reflecting a combination of prominent perivascular spaces and chronic lacunar infarcts. Background mild multifocal T2 FLAIR hyperintense signal abnormality within the cerebral white matter and pons, nonspecific but compatible with chronic small vessel ischemic disease. Partially empty sella turcica, a nonspecific finding. No evidence of an intracranial mass. No extra-axial fluid collection. No midline shift. Vascular: Reported below. Skull and upper cervical spine: Within the limitations of motion degradation, no focal suspicious marrow lesion is identified. Sinuses/Orbits: Visualized orbits show no acute finding. Bilateral lens replacements. Trace mucosal thickening within  the bilateral frontal sinuses. Moderate mucosal thickening within the bilateral ethmoid sinuses. Trace mucosal thickening within the bilateral sphenoid sinuses. Mild mucosal thickening within the bilateral maxillary sinuses. Other: Trace fluid within the bilateral mastoid air cells. MRA HEAD FINDINGS The examination is moderately motion degraded, limiting evaluation. Anterior  circulation: The intracranial internal carotid arteries are patent. There are sites of moderate stenosis within the cavernous/paraclinoid left ICA. The M1 middle cerebral arteries are patent. Redemonstrated known severe stenosis within the distal M1 segment of the left middle cerebral artery. Atherosclerotic irregularity of the M2 and more distal middle cerebral artery vessels, bilaterally. No M2 proximal branch occlusion is identified. The right anterior cerebral artery is patent. Unchanged from the prior catheter based angiogram of 06/06/2018, the left anterior cerebral artery is occluded at the level of the proximal A2 segment. Within the limitations of motion degradation, no intracranial aneurysm is identified. 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 The intracranial left vertebral artery is patent with no more than mild stenosis. Mild atherosclerotic irregularity of the basilar artery (versus motion artifact). The posterior cerebral arteries are patent. Apparent severe stenosis within the distal P2 segment of the right PCA, not definitively present on prior examinations. Apparent atherosclerotic irregularity of the left PCA. Most notably, there is apparent progressive moderate/severe stenosis within the P1 left PCA. A right posterior communicating artery is present. The left posterior communicating artery is diminutive or absent. Anatomic variants: As described. IMPRESSION: MRI brain: 1. Motion degraded examination, as described and limiting evaluation. 2. 3 mm acute infarct within the posterior left frontal lobe white matter. 3. Known chronic cortical/subcortical infarcts within the left frontal and parietal lobes. Additional known small chronic infarcts within the left occipital lobe, posterior left temporal and posterior right frontal lobe were better appreciated on the prior brain MRI of 05/24/2018 (acute at that time). 4. Redemonstrated chronic lacunar infarcts  within the bilateral cerebral hemispheric white matter and basal ganglia. 5. Background mild chronic small vessel ischemic changes within the cerebral white matter and pons, stable. 6. Mild-to-moderate generalized cerebral atrophy. 7. Paranasal sinus disease, as described. 8. Trace fluid within the bilateral mastoid air cells. MRA head: 1. Moderately motion degraded examination, limiting evaluation. 2. Intracranial atherosclerotic disease, most notably as follows. 3. 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 4. Redemonstrated sites of moderate stenosis within the cavernous/paraclinoid left ICA. 5. Redemonstrated severe stenosis within the distal M1 segment of the left MCA. 6. Apparent severe stenosis within the distal P2 right PCA, not definitely present on prior exams. 7. Progressive apparent moderate/severe stenosis within the P1 left PCA. Electronically Signed   By: Kellie Simmering D.O.   On: 05/05/2021 13:43   DG Abd Portable 1V  Result Date: 05/05/2021 CLINICAL DATA:  Check biliary drainage catheter EXAM: PORTABLE ABDOMEN - 1 VIEW COMPARISON:  04/29/2021 FINDINGS: Scattered large and small bowel gas is noted. Drainage catheter is noted in the right upper quadrant which is shown to be a percutaneous cholecystostomy tube on recent CT although multiple proximal side holes are noted which are likely within the substance of the liver and extrinsic to the liver margin given its positioning. No free air is seen. No bony abnormality is noted. IMPRESSION: No obstructive changes are seen. Catheter in the right upper quadrant which is shown to be a percutaneous cholecystostomy catheter on recent CT. It demonstrates multiple proximal side holes which are extrinsic  to the liver margin. IR consultation and possible evaluation of the catheter on 05/06/2021 is recommended. Electronically Signed   By: Inez Catalina M.D.   On:  05/05/2021 20:49   CT HEAD CODE STROKE WO CONTRAST  Result Date: 05/05/2021 CLINICAL DATA:  Code stroke. Neuro deficit, acute, stroke suspected. Right-sided weakness. EXAM: CT HEAD WITHOUT CONTRAST TECHNIQUE: Contiguous axial images were obtained from the base of the skull through the vertex without intravenous contrast. COMPARISON:  05/24/2018 FINDINGS: Brain: No focal abnormality affects the brainstem or cerebellum. Both cerebral hemispheres show chronic small-vessel ischemic changes of the white matter. 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 No sign of acute infarction, mass lesion, hemorrhage, hydrocephalus or extra-axial collection. Vascular: There is atherosclerotic calcification of the major vessels at the base of the brain. Skull: Negative Sinuses/Orbits: Clear/normal Other: None ASPECTS ([place] Stroke Program Early CT Score) - Ganglionic level infarction (caudate, lentiform nuclei, internal capsule, insula, M1-M3 cortex): 7 - Supraganglionic infarction (M4-M6 cortex): 3, allowing for the old left parietal infarction. Total score (0-10 with 10 being normal): 10, allowing for the old left parietal infarction. IMPRESSION: 1. No acute CT finding. 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 2. ASPECTS is 10, allowing for the old left parietal infarction. 3. These results were communicated to Dr. Leonie Man at 10:03 am on 05/05/2021 by text page via the [facility] messaging system. Electronically Signed   By: Nelson Chimes M.D.   On: 05/05/2021 10:06   VAS US CAROTID  Result Date: 05/06/2021 Carotid Arterial Duplex Study Patient Name:  TYREL LEX  Date of Exam:   05/06/2021 Medical Rec #: [id number]       Accession #:    [id number] Date of Birth: 12-Apr-1931       Patient Gender: M Patient Age:   74 years Exam Location:  [facility] Procedure:      VAS US CAROTID Referring Phys: PRAMOD SETHI  --------------------------------------------------------------------------------  Indications:  TIA. Risk Factors: Hypertension, Diabetes. Limitations   Today's exam was limited due to patient movement. Performing Technologist: Archie Patten RVS  Examination Guidelines: A complete evaluation includes B-mode imaging, spectral Doppler, color Doppler, and power Doppler as needed of all accessible portions of each vessel. Bilateral testing is considered an integral part of a complete examination. Limited examinations for reoccurring indications may be performed as noted.  Right Carotid Findings: +----------+--------+--------+--------+------------------+--------+             PSV cm/s EDV cm/s Stenosis Plaque Description Comments  +----------+--------+--------+--------+------------------+--------+  CCA Prox   68       11                heterogenous                 +----------+--------+--------+--------+------------------+--------+  CCA Distal 57       15                heterogenous                 +----------+--------+--------+--------+------------------+--------+  ICA Prox   80       24       1-39%    heterogenous                 +----------+--------+--------+--------+------------------+--------+  ICA Distal 48       17                                             +----------+--------+--------+--------+------------------+--------+  ECA        72                                                      +----------+--------+--------+--------+------------------+--------+ +----------+--------+-------+--------+-------------------+             PSV cm/s EDV cms Describe Arm Pressure (mmHG)  +----------+--------+-------+--------+-------------------+  Subclavian 86                                             +----------+--------+-------+--------+-------------------+ +---------+--------+--+--------+--+---------+  Vertebral PSV cm/s 49 EDV cm/s 15 Antegrade  +---------+--------+--+--------+--+---------+  Left Carotid Findings:  +----------+--------+--------+--------+------------------+--------+             PSV cm/s EDV cm/s Stenosis Plaque Description Comments  +----------+--------+--------+--------+------------------+--------+  CCA Prox   54       8                 heterogenous                 +----------+--------+--------+--------+------------------+--------+  CCA Distal 51       11                heterogenous                 +----------+--------+--------+--------+------------------+--------+  ICA Prox   47       16       1-39%    heterogenous                 +----------+--------+--------+--------+------------------+--------+  ICA Distal 35       16                                             +----------+--------+--------+--------+------------------+--------+  ECA        89       6                                              +----------+--------+--------+--------+------------------+--------+ +----------+--------+--------+--------+-------------------+             PSV cm/s EDV cm/s Describe Arm Pressure (mmHG)  +----------+--------+--------+--------+-------------------+  Subclavian 88                                              +----------+--------+--------+--------+-------------------+ +---------+--------+--+--------+--+---------+  Vertebral PSV cm/s 86 EDV cm/s 28 Antegrade  +---------+--------+--+--------+--+---------+   Summary: Right Carotid: Velocities in the right ICA are consistent with a 1-39% stenosis. Left Carotid: Velocities in the left ICA are consistent with a 1-39% stenosis. Vertebrals: Bilateral vertebral arteries demonstrate antegrade flow. *See table(s) above for measurements and observations.     Preliminary    ECHOCARDIOGRAM LIMITED  Result Date: 05/05/2021    ECHOCARDIOGRAM LIMITED REPORT   Patient Name:   Tagen LEDARIUS LEESON Date of Exam: 05/05/2021 Medical Rec #:  [id number]      Height:  73.0 in Accession #:    [id number]     Weight:       196.0 lb Date of Birth:  [birth year]      BSA:          2.133 m Patient  Age:    57 years       BP:           131/71 mmHg Patient Gender: M              HR:           72 bpm. Exam Location:  Inpatient Procedure: 3D Echo, Limited Echo, Cardiac Doppler and Color Doppler Indications:    TIA  History:        Patient has prior history of Echocardiogram examinations, most                 recent 05/25/2018. CAD and Previous Myocardial Infarction,                 Abnormal ECG and Prior CABG, Stroke, Arrythmias:Bradycardia and                 Atrial Fibrillation; Risk Factors:Diabetes, Hypertension,                 Dyslipidemia and Sleep Apnea. Covid positive.  Sonographer:    Roseanna Rainbow RDCS Referring Phys: [place]  1. Left ventricular ejection fraction, by estimation, is 45 to 50%. The left ventricle has mildly decreased function. The left ventricle demonstrates global hypokinesis. There is severe asymmetric left ventricular hypertrophy of the basal-septal segment.  2. Right ventricular systolic function is mildly reduced. The right ventricular size is normal. There is mildly elevated pulmonary artery systolic pressure. The estimated right ventricular systolic pressure is 41.9 mmHg.  3. The mitral valve is normal in structure. Trivial mitral valve regurgitation. No evidence of mitral stenosis.  4. The aortic valve was not well visualized. Aortic valve regurgitation is not visualized. No aortic stenosis is present.  5. The inferior vena cava is dilated in size with <50% respiratory variability, suggesting right atrial pressure of 15 mmHg.  6. Aortic dilatation noted. 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 FINDINGS  Left Ventricle: Left ventricular ejection fraction, by estimation, is 45 to 50%. The left ventricle has mildly decreased function. The left ventricle demonstrates global hypokinesis. The left ventricular internal cavity size was normal in size. There is  severe asymmetric left ventricular  hypertrophy of the basal-septal segment. Right Ventricle: The right ventricular size is normal. Right ventricular systolic function is mildly reduced. There is mildly elevated pulmonary artery systolic pressure. The tricuspid regurgitant velocity is 2.62 m/s, and with an assumed right atrial pressure of 15 mmHg, the estimated right ventricular systolic pressure is 37.9 mmHg. Pericardium: There is no evidence of pericardial effusion. Mitral Valve: The mitral valve is normal in structure. Trivial mitral valve regurgitation. No evidence of mitral valve stenosis. Tricuspid Valve: The tricuspid valve is normal in structure. Tricuspid valve regurgitation is mild. Aortic Valve: The aortic valve was not well visualized. Aortic valve regurgitation is not visualized. No aortic stenosis is present. Aorta: Aortic dilatation noted. There is mild dilatation of the aortic root, measuring 40 mm. There is an aneurysm involving the ascending aorta measuring 49 mm. Venous: The inferior vena cava is dilated in size with less than 50% respiratory variability, suggesting right atrial pressure of 15 mmHg. IAS/Shunts:  The interatrial septum was not well visualized. LEFT VENTRICLE PLAX 2D LVIDd:         5.10 cm LVIDs:         4.00 cm LV PW:         1.30 cm LV IVS:        1.70 cm LVOT diam:     2.30 cm      3D Volume EF: LVOT Area:     4.15 cm     3D EF:        62 %                             LV EDV:       168 ml                             LV ESV:       65 ml LV Volumes (MOD)            LV SV:        104 ml LV vol d, MOD A2C: 163.0 ml LV vol d, MOD A4C: 89.9 ml LV vol s, MOD A2C: 79.8 ml LV vol s, MOD A4C: 39.4 ml LV SV MOD A2C:     83.2 ml LV SV MOD A4C:     89.9 ml LV SV MOD BP:      64.5 ml IVC IVC diam: 2.40 cm LEFT ATRIUM         Index LA diam:    3.40 cm 1.59 cm/m   AORTA Ao Root diam: 4.00 cm Ao Asc diam:  4.90 cm TRICUSPID VALVE TR Peak grad:   27.5 mmHg TR Vmax:        262.00 cm/s  SHUNTS Systemic Diam: 2.30 cm Oswaldo Milian MD Electronically signed by Oswaldo Milian MD Signature Date/Time: 05/05/2021/3:52:38 PM    Final         Scheduled Meds:  amLODipine  10 mg Oral Q1500   vitamin C  500 mg Oral Daily   aspirin  81 mg Oral Daily   carvedilol  12.5 mg Oral QHS   carvedilol  9.375 mg Oral Daily   darbepoetin (ARANESP) injection - NON-DIALYSIS  150 mcg Subcutaneous Q Wed-1800   docusate sodium  100 mg Oral BID   doxazosin  4 mg Oral Daily   ezetimibe  10 mg Oral Daily   heparin  5,000 Units Subcutaneous Q8H   hydrALAZINE  50 mg Oral Q12H   insulin aspart  0-20 Units Subcutaneous TID WC   insulin aspart  0-5 Units Subcutaneous QHS   insulin glargine-yfgn  10 Units Subcutaneous Daily   isosorbide mononitrate  60 mg Oral BID   pantoprazole  40 mg Oral Daily   sodium chloride flush  3 mL Intravenous Q12H   sodium chloride flush  3 mL Intravenous Q12H   zinc sulfate  220 mg Oral Daily   Continuous Infusions:  sodium chloride     sodium chloride     sodium chloride 50 mL/hr at 05/06/21 0330     LOS: 7 days      Phillips Climes, MD [facility]   To contact the attending provider between 7A-7P or the covering provider during after hours 7P-7A, please log into the web site [email or web address] and access using universal [place] password for that web site. If you do not have the password, please  call the hospital operator.  05/06/2021, 3:31 PM

## 2021-05-06 NOTE — Progress Notes (Addendum)
STROKE TEAM PROGRESS NOTE   INTERVAL HISTORY Patient is seen in his room with no family at the bedside.  Code stroke was called yesterday when he developed right sided weakness and confusion.  Of note, patient has pre-existing right hand weakness as per wife.  NIHSS was 3, and patient was outside the window for TNK.  Thrombectomy was not considered as he did not have a LVO.  Vitals:   05/05/21 1723 05/05/21 2145 05/06/21 0646 05/06/21 0800  BP: 131/77 139/69 (!) 141/62 137/77  Pulse: 86 72 68 66  Resp: 16 19 18 18   Temp: 97.9 F (36.6 C) 98.5 F (36.9 C) 98.1 F (36.7 C) 98.1 F (36.7 C)  TempSrc:      SpO2: 100% 94% 92% 96%  Weight:      Height:       CBC:  Recent Labs  Lab 04/30/21 0346 05/05/21 1008  WBC 3.0* 7.4  NEUTROABS 2.5  --   HGB 6.0* 6.3*  HCT 18.7* 18.9*  MCV 96.4 94.0  PLT 230 619*   Basic Metabolic Panel:  Recent Labs  Lab 04/30/21 0346 05/05/21 1008  NA 129* 140  K 4.7 4.7  CL 100 109  CO2 18* 20*  GLUCOSE 286* 143*  BUN 47* 53*  CREATININE 2.76* 2.15*  CALCIUM 7.7* 8.5*  MG 2.3  --   PHOS 4.9*  --    Lipid Panel:  Recent Labs  Lab 05/05/21 1008  CHOL 82  TRIG 83  HDL 34*  CHOLHDL 2.4  VLDL 17  LDLCALC 31   HgbA1c:  Recent Labs  Lab 05/05/21 1008  HGBA1C 8.5*   Urine Drug Screen: No results for input(s): LABOPIA, COCAINSCRNUR, LABBENZ, AMPHETMU, THCU, LABBARB in the last 168 hours.  Alcohol Level No results for input(s): ETH in the last 168 hours.  IMAGING past 24 hours DG Abd Portable 1V  Result Date: 05/05/2021 CLINICAL DATA:  Check biliary drainage catheter EXAM: PORTABLE ABDOMEN - 1 VIEW COMPARISON:  04/29/2021 FINDINGS: Scattered large and small bowel gas is noted. Drainage catheter is noted in the right upper quadrant which is shown to be a percutaneous cholecystostomy tube on recent CT although multiple proximal side holes are noted which are likely within the substance of the liver and extrinsic to the liver margin given  its positioning. No free air is seen. No bony abnormality is noted. IMPRESSION: No obstructive changes are seen. Catheter in the right upper quadrant which is shown to be a percutaneous cholecystostomy catheter on recent CT. It demonstrates multiple proximal side holes which are extrinsic to the liver margin. IR consultation and possible evaluation of the catheter on 05/06/2021 is recommended. Electronically Signed   By: Inez Catalina M.D.   On: 05/05/2021 20:49   VAS US CAROTID  Result Date: 05/06/2021 Carotid Arterial Duplex Study Patient Name:  [name]  Date of Exam:   05/06/2021 Medical Rec #: [id number]       Accession #:    [id number] Date of Birth: 1930/08/09       Patient Gender: M Patient Age:   87 years Exam Location:  [facility] Procedure:      VAS US CAROTID Referring Phys: Gracelee Stemmler --------------------------------------------------------------------------------  Indications:  TIA. Risk Factors: Hypertension, Diabetes. Limitations   Today's exam was limited due to patient movement. Performing Technologist: Archie Patten RVS  Examination Guidelines: A complete evaluation includes B-mode imaging, spectral Doppler, color Doppler, and power Doppler as needed of all accessible portions of  each vessel. Bilateral testing is considered an integral part of a complete examination. Limited examinations for reoccurring indications may be performed as noted.  Right Carotid Findings: +----------+--------+--------+--------+------------------+--------+             PSV cm/s EDV cm/s Stenosis Plaque Description Comments  +----------+--------+--------+--------+------------------+--------+  CCA Prox   68       11                heterogenous                 +----------+--------+--------+--------+------------------+--------+  CCA Distal 57       15                heterogenous                 +----------+--------+--------+--------+------------------+--------+  ICA Prox   80       24       1-39%     heterogenous                 +----------+--------+--------+--------+------------------+--------+  ICA Distal 48       17                                             +----------+--------+--------+--------+------------------+--------+  ECA        72                                                      +----------+--------+--------+--------+------------------+--------+ +----------+--------+-------+--------+-------------------+             PSV cm/s EDV cms Describe Arm Pressure (mmHG)  +----------+--------+-------+--------+-------------------+  Subclavian 86                                             +----------+--------+-------+--------+-------------------+ +---------+--------+--+--------+--+---------+  Vertebral PSV cm/s 49 EDV cm/s 15 Antegrade  +---------+--------+--+--------+--+---------+  Left Carotid Findings: +----------+--------+--------+--------+------------------+--------+             PSV cm/s EDV cm/s Stenosis Plaque Description Comments  +----------+--------+--------+--------+------------------+--------+  CCA Prox   54       8                 heterogenous                 +----------+--------+--------+--------+------------------+--------+  CCA Distal 51       11                heterogenous                 +----------+--------+--------+--------+------------------+--------+  ICA Prox   47       16       1-39%    heterogenous                 +----------+--------+--------+--------+------------------+--------+  ICA Distal 35       16                                             +----------+--------+--------+--------+------------------+--------+  ECA        89       6                                              +----------+--------+--------+--------+------------------+--------+ +----------+--------+--------+--------+-------------------+             PSV cm/s EDV cm/s Describe Arm Pressure (mmHG)  +----------+--------+--------+--------+-------------------+  Subclavian 88                                               +----------+--------+--------+--------+-------------------+ +---------+--------+--+--------+--+---------+  Vertebral PSV cm/s 86 EDV cm/s 28 Antegrade  +---------+--------+--+--------+--+---------+   Summary: Right Carotid: Velocities in the right ICA are consistent with a 1-39% stenosis. Left Carotid: Velocities in the left ICA are consistent with a 1-39% stenosis. Vertebrals: Bilateral vertebral arteries demonstrate antegrade flow. *See table(s) above for measurements and observations.     Preliminary    ECHOCARDIOGRAM LIMITED  Result Date: 05/05/2021    ECHOCARDIOGRAM LIMITED REPORT   Patient Name:   [name] Date of Exam: 05/05/2021 Medical Rec #:  [id number]      Height:       73.0 in Accession #:    [id number]     Weight:       196.0 lb Date of Birth:  [birth year]      BSA:          2.133 m Patient Age:    21 years       BP:           131/71 mmHg Patient Gender: M              HR:           72 bpm. Exam Location:  Inpatient Procedure: 3D Echo, Limited Echo, Cardiac Doppler and Color Doppler Indications:    TIA  History:        Patient has prior history of Echocardiogram examinations, most                 recent 05/25/2018. CAD and Previous Myocardial Infarction,                 Abnormal ECG and Prior CABG, Stroke, Arrythmias:Bradycardia and                 Atrial Fibrillation; Risk Factors:Diabetes, Hypertension,                 Dyslipidemia and Sleep Apnea. Covid positive.  Sonographer:    Roseanna Rainbow RDCS Referring Phys: [place]  1. Left ventricular ejection fraction, by estimation, is 45 to 50%. The left ventricle has mildly decreased function. The left ventricle demonstrates global hypokinesis. There is severe asymmetric left ventricular hypertrophy of the basal-septal segment.  2. Right ventricular systolic function is mildly reduced. The right ventricular size is normal. There is mildly elevated pulmonary artery systolic pressure. The estimated right ventricular systolic  pressure is 77.4 mmHg.  3. The mitral valve is normal in structure. Trivial mitral valve regurgitation. No evidence of mitral stenosis.  4. The aortic valve was not well visualized. Aortic valve regurgitation is not visualized. No aortic stenosis is present.  5. The inferior vena cava is dilated in size with <50% respiratory variability,  suggesting right atrial pressure of 15 mmHg.  6. Aortic dilatation noted. 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 FINDINGS  Left Ventricle: Left ventricular ejection fraction, by estimation, is 45 to 50%. The left ventricle has mildly decreased function. The left ventricle demonstrates global hypokinesis. The left ventricular internal cavity size was normal in size. There is  severe asymmetric left ventricular hypertrophy of the basal-septal segment. Right Ventricle: The right ventricular size is normal. Right ventricular systolic function is mildly reduced. There is mildly elevated pulmonary artery systolic pressure. The tricuspid regurgitant velocity is 2.62 m/s, and with an assumed right atrial pressure of 15 mmHg, the estimated right ventricular systolic pressure is 63.8 mmHg. Pericardium: There is no evidence of pericardial effusion. Mitral Valve: The mitral valve is normal in structure. Trivial mitral valve regurgitation. No evidence of mitral valve stenosis. Tricuspid Valve: The tricuspid valve is normal in structure. Tricuspid valve regurgitation is mild. Aortic Valve: The aortic valve was not well visualized. Aortic valve regurgitation is not visualized. No aortic stenosis is present. Aorta: Aortic dilatation noted. There is mild dilatation of the aortic root, measuring 40 mm. There is an aneurysm involving the ascending aorta measuring 49 mm. Venous: The inferior vena cava is dilated in size with less than 50% respiratory variability, suggesting right atrial pressure of 15 mmHg. IAS/Shunts: The  interatrial septum was not well visualized. LEFT VENTRICLE PLAX 2D LVIDd:         5.10 cm LVIDs:         4.00 cm LV PW:         1.30 cm LV IVS:        1.70 cm LVOT diam:     2.30 cm      3D Volume EF: LVOT Area:     4.15 cm     3D EF:        62 %                             LV EDV:       168 ml                             LV ESV:       65 ml LV Volumes (MOD)            LV SV:        104 ml LV vol d, MOD A2C: 163.0 ml LV vol d, MOD A4C: 89.9 ml LV vol s, MOD A2C: 79.8 ml LV vol s, MOD A4C: 39.4 ml LV SV MOD A2C:     83.2 ml LV SV MOD A4C:     89.9 ml LV SV MOD BP:      64.5 ml IVC IVC diam: 2.40 cm LEFT ATRIUM         Index LA diam:    3.40 cm 1.59 cm/m   AORTA Ao Root diam: 4.00 cm Ao Asc diam:  4.90 cm TRICUSPID VALVE TR Peak grad:   27.5 mmHg TR Vmax:        262.00 cm/s  SHUNTS Systemic Diam: 2.30 cm Oswaldo Milian MD Electronically signed by Oswaldo Milian MD Signature Date/Time: 05/05/2021/3:52:38 PM    Final     PHYSICAL EXAM General:  Alert, chronically ill appearing elderly Caucasian male in no acute distress   NEURO:  Mental Status: AA&Ox3  Speech/Language: speech is without dysarthria or aphasia.    Cranial Nerves:  II: PERRL. Visual fields full.  III, IV, VI: EOMI. Eyelids elevate symmetrically.  V: Sensation is intact to light touch and symmetrical to face.  VII: Smile is symmetrical.  VIII: hearing intact to voice. IX, X: Phonation is normal.  XII: tongue is midline without fasciculations. Motor: 5/5 strength to LUE, LLE and RLE. 4/5 strength in RUE.  Wasting and weakness of the right hand intrinsic muscles.  Weak right grip Sensation- Intact to light touch bilaterally. Extinction absent to light touch to DSS.  Coordination: FTN intact bilaterally, HKS: no ataxia in BLE.No drift.  Gait- deferred   ASSESSMENT/PLAN Mr. [name] is a 87 y.o. male with history of AAA, OSA, CAD s/p CABG, stroke, CHF, HTN, DM, HLD, recent gallbladder removal and COVID-19 infection  presented yesterday with confusion and right sided weakness.  Code stroke was called.  Of note, patient has pre-existing right hand weakness.  NIHSS was 3, and patient was outside the window for TNK.  Thrombectomy was not considered as he did not have a LVO. Small left posterior frontal lobe infarct was found on MRI.  Stroke service will sign off for now but will remain available for any questions or concerns.  Stroke:  left posterior frontal lobe punctate  white matter infarct  i from small vessel disease Code Stroke CT head No acute abnormality. Chronic left parietal infarction. ASPECTS 10.    MRI  64mm acute infarct in posterior left frontal lobe white matter, known chronic infarcts in left occipital lobe, posterior left temporal and posterior right frontal lobes MRA  known chronic left ACA occlusion, left ICA stenosis, severe stenosis of distal M1 segment of left MCA, severe stenosis of distal P2 right PCA and moderate/severe stenosis of P1 left PCA Carotid Doppler  1-39% stenosis of bilateral carotids 2D Echo EF 45-50%, asymmetrical LVH of basal septal segment, ascending aortic aneurysm, interatrial septum not well visualized LDL 31 HgbA1c 8.5 VTE prophylaxis - SCDs    Diet   Diet NPO time specified   aspirin 81 mg daily and clopidogrel 75 mg daily prior to admission, now on aspirin 81 mg daily and clopidogrel 75 mg daily.  Therapy recommendations:  pending Disposition:  pending  Hypertension Home meds:  Norvasc 10 mg daily Stable Permissive hypertension (OK if < 220/120) but gradually normalize in 5-7 days Long-term BP goal normotensive  Hyperlipidemia Home meds:  ezetimibe 10 mg daily, resumed in hospital LDL 31, goal < 70 High intensity statin not indicated due to intolerance Continue statin at discharge  Diabetes type II Uncontrolled Home meds:  none HgbA1c 8.5, goal < 7.0 CBGs Recent Labs    05/05/21 2144 05/06/21 0645 05/06/21 1231  GLUCAP 282* 162* 126*   Insulin  glargine 10 units daily SSI  Other Stroke Risk Factors Advanced Age >/= 76  Former cigarette smoker Hx stroke Coronary artery disease Obstructive sleep apnea Congestive heart failure  Other Active Problems Patient declines blood transfusions due to religious beliefs  Hospital day # [place] , MSN, AGACNP-BC [facility] See Amion for schedule and pager information 05/06/2021 2:42 PM   STROKE MD NOTE : Patient developed transient weakness yesterday likely due to TIA but MRI does show tiny punctate left frontal white matter infarct from small vessel disease.  He does have some right hand weakness which appears chronic from nerve injury in the past.  Recommend  aspirin Plavix for 3 weeks and then Plavix alone from stroke standpoint.  May continue long-term dual antiplatelet therapy if necessary from cardiac standpoint.  And aggressive risk factor modification.  Long discussion patient and wife at the bedside and answered questions.  Discussed with Dr. Emeline Gins.  Stroke team will sign off.  Kindly call for questions.  Greater than 50% time during this 25-minute visit was spent on counseling and coordination of care about his lacunar stroke and discussion about stroke prevention and treatment and answering questions. Antony Contras, MD To contact Stroke Continuity provider, please refer to [email or web address] After hours, contact General Neurology

## 2021-05-06 NOTE — Progress Notes (Signed)
Referring Physician(s): Elgergawy,D  Supervising Physician: Sandi Mariscal  Patient Status:  [facility] - In-pt  Chief Complaint:  Leaking at gallbladder drain insertion site  Subjective: Pt resting quietly in bed; continues to have some leaking from GB drain insertion site; sl tender at drain site ; denies fever/chills/N/V; drain placed at [facility] on 11/3 for cholecystitis; abd film yesterday showed multiple proximal sideholes extrinsic to liver; med hx as below; asked by TRH to evaluate  Past Medical History:  Diagnosis Date   Abdominal aortic aneurysm    a. Aortic duplex 06/2014: mild aneurysmal dilatation of proximal abdominal aorta measuring 3.4x3.4cm. No sig change from 2012. F/u due 06/2016;    Arthritis    "right knee; never bothered me" (03/26/2016)   Balanitis xerotica obliterans    with meatal stenosis and distal stricture   Carpal tunnel syndrome of right wrist 06/24/2015   Chronic anemia 04/17/2018   Chronic combined systolic and diastolic CHF (congestive heart failure) ([place])    Coronary artery disease involving coronary bypass graft of native heart with angina pectoris ([place]) 09/19/2014   Cath for Inf STEMI 03/27/16  Conclusions: Significant native coronary artery disease, including 70% ostial LAD stenosis, 50% mid LCx lesion, and 99% proximal RCA disease with TIMI-1 flow (chronic per prior cath reports). Patent LIMA to LAD. Acutely occluded SVG to PDA within previously placed stent. 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 (03/26/2016)   Stenosis of left carotid artery    Suprapubic catheter ([place])    Vitamin D deficiency 05/27/2018   Past Surgical History:  Procedure Laterality Date   APPENDECTOMY     CARDIAC CATHETERIZATION  12/11/2010   Dr. Chase Picket - subsequent cath - normal LV systolic function, no renal artery stenosis, severe 2-vessel disease with subtotaled RCA prox and distal 60% lesion and complex 70% area of narrowing of ostium of LAD   CARDIAC CATHETERIZATION N/A 03/26/2016   Procedure: Left Heart Cath and Coronary Angiography;  Surgeon: Nelva Bush, MD;  Location: [place] CV LAB;  Service: Cardiovascular;  Laterality: N/A;   CARDIAC CATHETERIZATION N/A 03/26/2016   Procedure: Coronary Stent Intervention;  Surgeon: Nelva Bush, MD;  Location: [place] CV [facility];  Service: Cardiovascular: 100% In-stent thrombosis of pros SVG-RCA (Xience DES) --> treated with PromusDES 3.0 x 18 (3.3 mm)   CARDIAC CATHETERIZATION N/A 03/26/2016   Procedure: Bypass Graft Angiography;  Surgeon: Nelva Bush, MD;  Location: [place] [facility];  Service: Cardiovascular;  Laterality: N/A;   CORONARY ANGIOPLASTY WITH STENT PLACEMENT  06/18/2014   PCI to SVG-RPDA 06/18/14 (Xience [place] DES 3.0 x 18 mm -3.35 mm),    CORONARY ARTERY BYPASS GRAFT  12/15/2010   X2, Dr Servando Snare; LIMA to LAD, SVG to PDA;    CORONARY BALLOON ANGIOPLASTY N/A 04/18/2018   Procedure: CORONARY BALLOON ANGIOPLASTY;  Surgeon: Leonie Man, MD;  Location: [facility]  INVASIVE CV LAB;  Service: Cardiovascular;;; high pressure scoring and noncompliant balloon PTCA of SVG-RCA ISR ostial and proximal   CORONARY/GRAFT ANGIOGRAPHY N/A 08/27/2017   Procedure: CORONARY/GRAFT ANGIOGRAPHY;  Surgeon: Nelva Bush, MD;  Location: [place] [facility];  Service: Cardiovascular;; pLAD 70%, ostD1 50%.  mCx 60%, OM2 80%. pRCA 95% & mRCA  100% - rPDA 70%. LIMA-mLAD patent. SVG-rPDA 10% ISR.    CYSTOSCOPY WITH URETHRAL DILATATION     IR 3D INDEPENDENT WKST  10/26/2018   IR ANGIO INTRA EXTRACRAN SEL COM CAROTID INNOMINATE BILAT MOD SED  02/25/2017   IR ANGIO INTRA EXTRACRAN SEL COM CAROTID INNOMINATE BILAT MOD SED  10/19/2017   Dr. Kathee Delton: L Common Carotid - ECA & major branches widely patent. ICA ~20% distal to bulb & 50% in supraclinoid segment. LMCA-distal 1/3 MI ~905 stenosis with post-stenotic dilation into inferior division. ~50% prox Basilar A stenosis @ anterior Inf Cerebellar A. 50% R ICA   IR ANGIO INTRA EXTRACRAN SEL COM CAROTID INNOMINATE UNI L MOD SED  06/06/2018   IR ANGIO INTRA EXTRACRAN SEL COM CAROTID INNOMINATE UNI L MOD SED  10/26/2018   IR ANGIO VERTEBRAL SEL SUBCLAVIAN INNOMINATE UNI L MOD SED  02/25/2017   IR ANGIO VERTEBRAL SEL SUBCLAVIAN INNOMINATE UNI R MOD SED  10/19/2017   IR ANGIO VERTEBRAL SEL VERTEBRAL UNI L MOD SED  10/19/2017   IR GENERIC HISTORICAL  01/21/2016   IR RADIOLOGIST EVAL & MGMT 01/21/2016 [facility]-INTERV RAD   IR GENERIC HISTORICAL  02/03/2016   IR CATHETER TUBE CHANGE 02/03/2016 Marybelle Killings, MD [facility]-INTERV RAD   IR RADIOLOGIST EVAL & MGMT  11/09/2017   LEFT HEART CATH AND CORONARY ANGIOGRAPHY N/A 04/18/2018   Procedure: LEFT HEART CATH AND CORONARY ANGIOGRAPHY;  Surgeon: Leonie Man, MD;  Location: [place] CV LAB;  Service: Cardiovascular;  Laterality: N/A; stable findings on last cath with exception of 75% in-stent restenosis of SVG-RCA ostial stent treated with PTCA.     LEFT HEART CATHETERIZATION WITH CORONARY ANGIOGRAM N/A 06/18/2014   Procedure: LEFT HEART CATHETERIZATION WITH CORONARY ANGIOGRAM;  Surgeon: Leonie Man, MD;  Location: [facility] CATH LAB;  Service: Cardiovascular;  -- severe disease of SVG-rPDA   NO PAST SURGERIES     RADIOLOGY WITH ANESTHESIA N/A 01/24/2015   Procedure: STENT ASSISTED ANGIOPLASTY (RADIOLOGY WITH ANESTHESIA);  Surgeon: Luanne Bras, MD;  Location: [place];  Service:  Radiology;  Laterality: N/A;   RADIOLOGY WITH ANESTHESIA N/A 06/06/2018   Procedure: STENTING;  Surgeon: Luanne Bras, MD;  Location: [place];  Service: Radiology;  Laterality: N/A;   RADIOLOGY WITH ANESTHESIA N/A 10/26/2018   Procedure: RADIOLOGY WITH ANESTHESIA;  Surgeon: Luanne Bras, MD;  Location: [place];  Service: Radiology;  Laterality: N/A;   TONSILLECTOMY     TRANSTHORACIC ECHOCARDIOGRAM  08/2017; 04/2018:    A) EF 60-65%. Mild LVH. No RWMA. Gr 1 DD. Mod LA dilation.;  B)  EF 35-40%.  Severe LVH.  GRII DD.  Apical anteroseptal hypokinesis.  Akinesis of the apex.      Allergies: Atorvastatin, Crestor [rosuvastatin], Other, Pravastatin, Simvastatin, and Statins  Medications: Prior to Admission medications   Medication Sig Start Date End Date Taking? Authorizing Provider  acetaminophen (TYLENOL) 325 MG tablet Take 2 tablets (650 mg total) by mouth every 6 (six) hours as needed for mild pain (or Fever >/= 101). 05/31/18  Yes Angiulli, Lavon Paganini, PA-C  amLODipine (NORVASC) 10 MG tablet TAKE 1 TABLET BY MOUTH EVERY DAY IN THE EVENING Patient taking  differently: Take 10 mg by mouth daily in the afternoon. 02/13/20  Yes Leonie Man, MD  aspirin 81 MG chewable tablet Chew 1 tablet (81 mg total) by mouth daily. 03/29/16  Yes Isaiah Serge, NP  carvedilol (COREG) 6.25 MG tablet Take 1.5 tablets (9.375 mg total) by mouth every morning AND 2 tablets (12.5 mg total) every evening. 03/15/20  Yes Leonie Man, MD  clopidogrel (PLAVIX) 75 MG tablet Take 1 tablet (75 mg total) by mouth daily. 01/01/21  Yes Leonie Man, MD  doxazosin (CARDURA) 4 MG tablet Take 1 tablet (4 mg total) by mouth daily. 03/27/19  Yes Lendon Colonel, NP  ezetimibe (ZETIA) 10 MG tablet Take 1 tablet (10 mg total) by mouth daily. 01/01/21 04/29/21 Yes Leonie Man, MD  Ferrous Sulfate 134 MG TABS Take 134 mg by mouth daily.   Yes [provider]  furosemide (LASIX) 20 MG tablet Take 20 mg  by mouth as needed. As Needed for Swelling and Edema   Yes [provider]  glimepiride (AMARYL) 4 MG tablet TAKE 1 TABLET BY MOUTH DAILY WITH BREAKFAST Patient taking differently: Take 4 mg by mouth daily with breakfast. 02/13/20  Yes Leonie Man, MD  hydrALAZINE (APRESOLINE) 50 MG tablet Take 1 tablet (50 mg total) by mouth in the morning and at bedtime. 01/03/20  Yes Leonie Man, MD  isosorbide mononitrate (IMDUR) 60 MG 24 hr tablet TAKE 1 TABLET (60 MG TOTAL) BY MOUTH 2 (TWO) TIMES DAILY. 12/02/20  Yes Leonie Man, MD  metFORMIN (GLUCOPHAGE) 1000 MG tablet TAKE 0.5 TABLETS BY MOUTH 2 TIMES DAILY. 06/17/20  Yes Leonie Man, MD  nitroGLYCERIN (NITROSTAT) 0.4 MG SL tablet Place 0.4 mg under the tongue every 5 (five) minutes as needed for chest pain. 03/27/19  Yes [provider]  oxybutynin (DITROPAN) 5 MG tablet Take 5 mg by mouth daily as needed for bladder spasms.  09/07/17  Yes [provider]     Vital Signs: BP 137/77    Pulse 66    Temp 98.1 F (36.7 C)    Resp 18    Ht 6\' 1"  (1.854 m)    Wt 195 lb 15.8 oz (88.9 kg)    SpO2 96%    BMI 25.86 kg/m   Physical Exam awake, answering simple questions ok; GB drain intact, suture in place, some erythema at insertion site, OP 20 cc, overlying gauze covered with some bilious fluid; chest- sl dim BS bases; heart- RRR; abd- soft,+BS; no LE edema but with residual mild RUE/RLE weakness  Imaging: MR ANGIO HEAD WO CONTRAST  Result Date: 05/05/2021 CLINICAL DATA:  Provided history: Neuro deficit, acute, stroke suspected. Cough and generalized weakness/COVID. EXAM: MRI HEAD WITHOUT CONTRAST MRA HEAD WITHOUT CONTRAST TECHNIQUE: Multiplanar, multi-echo pulse sequences of the brain and surrounding structures were acquired without intravenous contrast. Angiographic images of the Circle of Willis were acquired using MRA technique without intravenous contrast. COMPARISON:  Noncontrast head CT 05/05/2021. Report from  catheter based angiography 06/06/2018. Report from catheter based angiography 10/26/2018 brain MRI 05/24/2018. CT angiogram head/neck 05/24/2018. FINDINGS: MRI HEAD FINDINGS Brain: Intermittently motion degraded examination, limiting evaluation. Most notably, there is severe motion degradation of the sagittal T1 weighted sequence and moderate/severe motion degradation of the coronal T2 TSE sequence. Mild-to-moderate generalized cerebral atrophy. 3 mm focus of restricted diffusion within the posterior left frontal lobe white matter, compatible with acute infarction (series 5, image 42) (series 4, image 19). Redemonstrated chronic  cortical/subcortical infarcts within the left frontal and parietal lobes. Additional known small chronic infarcts within the left occipital lobe, posterior left temporal lobe and posterior right frontal lobe were better appreciated on the prior brain MRI of 05/24/2018 (acute at that time). Redemonstrated chronic lacunar infarcts within the bilateral cerebral hemispheric white matter. Redemonstrated circumscribed foci of T2 hyperintensity within the bilateral basal ganglia, likely reflecting a combination of prominent perivascular spaces and chronic lacunar infarcts. Background mild multifocal T2 FLAIR hyperintense signal abnormality within the cerebral white matter and pons, nonspecific but compatible with chronic small vessel ischemic disease. Partially empty sella turcica, a nonspecific finding. No evidence of an intracranial mass. No extra-axial fluid collection. No midline shift. Vascular: Reported below. Skull and upper cervical spine: Within the limitations of motion degradation, no focal suspicious marrow lesion is identified. Sinuses/Orbits: Visualized orbits show no acute finding. Bilateral lens replacements. Trace mucosal thickening within the bilateral frontal sinuses. Moderate mucosal thickening within the bilateral ethmoid sinuses. Trace mucosal thickening within the bilateral  sphenoid sinuses. Mild mucosal thickening within the bilateral maxillary sinuses. Other: Trace fluid within the bilateral mastoid air cells. MRA HEAD FINDINGS The examination is moderately motion degraded, limiting evaluation. Anterior circulation: The intracranial internal carotid arteries are patent. There are sites of moderate stenosis within the cavernous/paraclinoid left ICA. The M1 middle cerebral arteries are patent. Redemonstrated known severe stenosis within the distal M1 segment of the left middle cerebral artery. Atherosclerotic irregularity of the M2 and more distal middle cerebral artery vessels, bilaterally. No M2 proximal branch occlusion is identified. The right anterior cerebral artery is patent. Unchanged from the prior catheter based angiogram of 06/06/2018, the left anterior cerebral artery is occluded at the level of the proximal A2 segment. Within the limitations of motion degradation, no intracranial aneurysm is identified. 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 The intracranial left vertebral artery is patent with no more than mild stenosis. Mild atherosclerotic irregularity of the basilar artery (versus motion artifact). The posterior cerebral arteries are patent. Apparent severe stenosis within the distal P2 segment of the right PCA, not definitively present on prior examinations. Apparent atherosclerotic irregularity of the left PCA. Most notably, there is apparent progressive moderate/severe stenosis within the P1 left PCA. A right posterior communicating artery is present. The left posterior communicating artery is diminutive or absent. Anatomic variants: As described. IMPRESSION: MRI brain: 1. Motion degraded examination, as described and limiting evaluation. 2. 3 mm acute infarct within the posterior left frontal lobe white matter. 3. Known chronic cortical/subcortical infarcts within the left frontal and parietal lobes. Additional known  small chronic infarcts within the left occipital lobe, posterior left temporal and posterior right frontal lobe were better appreciated on the prior brain MRI of 05/24/2018 (acute at that time). 4. Redemonstrated chronic lacunar infarcts within the bilateral cerebral hemispheric white matter and basal ganglia. 5. Background mild chronic small vessel ischemic changes within the cerebral white matter and pons, stable. 6. Mild-to-moderate generalized cerebral atrophy. 7. Paranasal sinus disease, as described. 8. Trace fluid within the bilateral mastoid air cells. MRA head: 1. Moderately motion degraded examination, limiting evaluation. 2. Intracranial atherosclerotic disease, most notably as follows. 3. 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 4. Redemonstrated sites of moderate stenosis within the cavernous/paraclinoid left ICA. 5. Redemonstrated severe stenosis within the distal M1 segment of the left MCA. 6. Apparent severe stenosis within the distal P2 right  PCA, not definitely present on prior exams. 7. Progressive apparent moderate/severe stenosis within the P1 left PCA. Electronically Signed   By: Kellie Simmering D.O.   On: 05/05/2021 13:43   MR BRAIN WO CONTRAST  Result Date: 05/05/2021 CLINICAL DATA:  Provided history: Neuro deficit, acute, stroke suspected. Cough and generalized weakness/COVID. EXAM: MRI HEAD WITHOUT CONTRAST MRA HEAD WITHOUT CONTRAST TECHNIQUE: Multiplanar, multi-echo pulse sequences of the brain and surrounding structures were acquired without intravenous contrast. Angiographic images of the Circle of Willis were acquired using MRA technique without intravenous contrast. COMPARISON:  Noncontrast head CT 05/05/2021. Report from catheter based angiography 06/06/2018. Report from catheter based angiography 10/26/2018 brain MRI 05/24/2018. CT angiogram head/neck 05/24/2018. FINDINGS: MRI HEAD  FINDINGS Brain: Intermittently motion degraded examination, limiting evaluation. Most notably, there is severe motion degradation of the sagittal T1 weighted sequence and moderate/severe motion degradation of the coronal T2 TSE sequence. Mild-to-moderate generalized cerebral atrophy. 3 mm focus of restricted diffusion within the posterior left frontal lobe white matter, compatible with acute infarction (series 5, image 42) (series 4, image 19). Redemonstrated chronic cortical/subcortical infarcts within the left frontal and parietal lobes. Additional known small chronic infarcts within the left occipital lobe, posterior left temporal lobe and posterior right frontal lobe were better appreciated on the prior brain MRI of 05/24/2018 (acute at that time). Redemonstrated chronic lacunar infarcts within the bilateral cerebral hemispheric white matter. Redemonstrated circumscribed foci of T2 hyperintensity within the bilateral basal ganglia, likely reflecting a combination of prominent perivascular spaces and chronic lacunar infarcts. Background mild multifocal T2 FLAIR hyperintense signal abnormality within the cerebral white matter and pons, nonspecific but compatible with chronic small vessel ischemic disease. Partially empty sella turcica, a nonspecific finding. No evidence of an intracranial mass. No extra-axial fluid collection. No midline shift. Vascular: Reported below. Skull and upper cervical spine: Within the limitations of motion degradation, no focal suspicious marrow lesion is identified. Sinuses/Orbits: Visualized orbits show no acute finding. Bilateral lens replacements. Trace mucosal thickening within the bilateral frontal sinuses. Moderate mucosal thickening within the bilateral ethmoid sinuses. Trace mucosal thickening within the bilateral sphenoid sinuses. Mild mucosal thickening within the bilateral maxillary sinuses. Other: Trace fluid within the bilateral mastoid air cells. MRA HEAD FINDINGS The  examination is moderately motion degraded, limiting evaluation. Anterior circulation: The intracranial internal carotid arteries are patent. There are sites of moderate stenosis within the cavernous/paraclinoid left ICA. The M1 middle cerebral arteries are patent. Redemonstrated known severe stenosis within the distal M1 segment of the left middle cerebral artery. Atherosclerotic irregularity of the M2 and more distal middle cerebral artery vessels, bilaterally. No M2 proximal branch occlusion is identified. The right anterior cerebral artery is patent. Unchanged from the prior catheter based angiogram of 06/06/2018, the left anterior cerebral artery is occluded at the level of the proximal A2 segment. Within the limitations of motion degradation, no intracranial aneurysm is identified. 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 The intracranial left vertebral artery is patent with no more than mild stenosis. Mild atherosclerotic irregularity of the basilar artery (versus motion artifact). The posterior cerebral arteries are patent. Apparent severe stenosis within the distal P2 segment of the right PCA, not definitively present on prior examinations. Apparent atherosclerotic irregularity of the left PCA. Most notably, there is apparent progressive moderate/severe stenosis within the P1 left PCA. A right posterior communicating artery is present. The left posterior communicating artery is diminutive or absent. Anatomic variants: As described. IMPRESSION: MRI brain: 1. Motion degraded examination,  as described and limiting evaluation. 2. 3 mm acute infarct within the posterior left frontal lobe white matter. 3. Known chronic cortical/subcortical infarcts within the left frontal and parietal lobes. Additional known small chronic infarcts within the left occipital lobe, posterior left temporal and posterior right frontal lobe were better appreciated on the prior brain MRI of  05/24/2018 (acute at that time). 4. Redemonstrated chronic lacunar infarcts within the bilateral cerebral hemispheric white matter and basal ganglia. 5. Background mild chronic small vessel ischemic changes within the cerebral white matter and pons, stable. 6. Mild-to-moderate generalized cerebral atrophy. 7. Paranasal sinus disease, as described. 8. Trace fluid within the bilateral mastoid air cells. MRA head: 1. Moderately motion degraded examination, limiting evaluation. 2. Intracranial atherosclerotic disease, most notably as follows. 3. 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 4. Redemonstrated sites of moderate stenosis within the cavernous/paraclinoid left ICA. 5. Redemonstrated severe stenosis within the distal M1 segment of the left MCA. 6. Apparent severe stenosis within the distal P2 right PCA, not definitely present on prior exams. 7. Progressive apparent moderate/severe stenosis within the P1 left PCA. Electronically Signed   By: Kellie Simmering D.O.   On: 05/05/2021 13:43   DG Abd Portable 1V  Result Date: 05/05/2021 CLINICAL DATA:  Check biliary drainage catheter EXAM: PORTABLE ABDOMEN - 1 VIEW COMPARISON:  04/29/2021 FINDINGS: Scattered large and small bowel gas is noted. Drainage catheter is noted in the right upper quadrant which is shown to be a percutaneous cholecystostomy tube on recent CT although multiple proximal side holes are noted which are likely within the substance of the liver and extrinsic to the liver margin given its positioning. No free air is seen. No bony abnormality is noted. IMPRESSION: No obstructive changes are seen. Catheter in the right upper quadrant which is shown to be a percutaneous cholecystostomy catheter on recent CT. It demonstrates multiple proximal side holes which are extrinsic to the liver margin. IR consultation and possible evaluation of the catheter on  05/06/2021 is recommended. Electronically Signed   By: Inez Catalina M.D.   On: 05/05/2021 20:49   CT HEAD CODE STROKE WO CONTRAST  Result Date: 05/05/2021 CLINICAL DATA:  Code stroke. Neuro deficit, acute, stroke suspected. Right-sided weakness. EXAM: CT HEAD WITHOUT CONTRAST TECHNIQUE: Contiguous axial images were obtained from the base of the skull through the vertex without intravenous contrast. COMPARISON:  05/24/2018 FINDINGS: Brain: No focal abnormality affects the brainstem or cerebellum. Both cerebral hemispheres show chronic small-vessel ischemic changes of the white matter. 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 No sign of acute infarction, mass lesion, hemorrhage, hydrocephalus or extra-axial collection. Vascular: There is atherosclerotic calcification of the major vessels at the base of the brain. Skull: Negative Sinuses/Orbits: Clear/normal Other: None ASPECTS ([place] Stroke Program Early CT Score) - Ganglionic level infarction (caudate, lentiform nuclei, internal capsule, insula, M1-M3 cortex): 7 - Supraganglionic infarction (M4-M6 cortex): 3, allowing for the old left parietal infarction. Total score (0-10 with 10 being normal): 10, allowing for the old left parietal infarction. IMPRESSION: 1. No acute CT finding. 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 2. ASPECTS is 10, allowing for the old left parietal infarction. 3. These results were communicated to Dr. Leonie Man at 10:03 am on 05/05/2021 by text page via the [facility] messaging system. Electronically Signed   By: Jan Fireman.D.  On: 05/05/2021 10:06   ECHOCARDIOGRAM LIMITED  Result Date: 05/05/2021    ECHOCARDIOGRAM LIMITED REPORT   Patient Name:   [name] Date of Exam: 05/05/2021 Medical Rec #:  [id number]      Height:       73.0 in Accession #:    [id number]     Weight:       196.0 lb Date of Birth:  May 12, 1931      BSA:           2.133 m Patient Age:    21 years       BP:           131/71 mmHg Patient Gender: M              HR:           72 bpm. Exam Location:  Inpatient Procedure: 3D Echo, Limited Echo, Cardiac Doppler and Color Doppler Indications:    TIA  History:        Patient has prior history of Echocardiogram examinations, most                 recent 05/25/2018. CAD and Previous Myocardial Infarction,                 Abnormal ECG and Prior CABG, Stroke, Arrythmias:Bradycardia and                 Atrial Fibrillation; Risk Factors:Diabetes, Hypertension,                 Dyslipidemia and Sleep Apnea. Covid positive.  Sonographer:    Roseanna Rainbow RDCS Referring Phys: Red [place]  1. Left ventricular ejection fraction, by estimation, is 45 to 50%. The left ventricle has mildly decreased function. The left ventricle demonstrates global hypokinesis. There is severe asymmetric left ventricular hypertrophy of the basal-septal segment.  2. Right ventricular systolic function is mildly reduced. The right ventricular size is normal. There is mildly elevated pulmonary artery systolic pressure. The estimated right ventricular systolic pressure is 09.4 mmHg.  3. The mitral valve is normal in structure. Trivial mitral valve regurgitation. No evidence of mitral stenosis.  4. The aortic valve was not well visualized. Aortic valve regurgitation is not visualized. No aortic stenosis is present.  5. The inferior vena cava is dilated in size with <50% respiratory variability, suggesting right atrial pressure of 15 mmHg.  6. Aortic dilatation noted. 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 FINDINGS  Left Ventricle: Left ventricular ejection fraction, by estimation, is 45 to 50%. The left ventricle has mildly decreased function. The left ventricle demonstrates global hypokinesis. The left ventricular internal cavity size was normal in size. There is  severe asymmetric  left ventricular hypertrophy of the basal-septal segment. Right Ventricle: The right ventricular size is normal. Right ventricular systolic function is mildly reduced. There is mildly elevated pulmonary artery systolic pressure. The tricuspid regurgitant velocity is 2.62 m/s, and with an assumed right atrial pressure of 15 mmHg, the estimated right ventricular systolic pressure is 70.9 mmHg. Pericardium: There is no evidence of pericardial effusion. Mitral Valve: The mitral valve is normal in structure. Trivial mitral valve regurgitation. No evidence of mitral valve stenosis. Tricuspid Valve: The tricuspid valve is normal in structure. Tricuspid valve regurgitation is mild. Aortic Valve: The aortic valve was not well visualized. Aortic valve regurgitation is not visualized. No aortic stenosis is present. Aorta: Aortic  dilatation noted. There is mild dilatation of the aortic root, measuring 40 mm. There is an aneurysm involving the ascending aorta measuring 49 mm. Venous: The inferior vena cava is dilated in size with less than 50% respiratory variability, suggesting right atrial pressure of 15 mmHg. IAS/Shunts: The interatrial septum was not well visualized. LEFT VENTRICLE PLAX 2D LVIDd:         5.10 cm LVIDs:         4.00 cm LV PW:         1.30 cm LV IVS:        1.70 cm LVOT diam:     2.30 cm      3D Volume EF: LVOT Area:     4.15 cm     3D EF:        62 %                             LV EDV:       168 ml                             LV ESV:       65 ml LV Volumes (MOD)            LV SV:        104 ml LV vol d, MOD A2C: 163.0 ml LV vol d, MOD A4C: 89.9 ml LV vol s, MOD A2C: 79.8 ml LV vol s, MOD A4C: 39.4 ml LV SV MOD A2C:     83.2 ml LV SV MOD A4C:     89.9 ml LV SV MOD BP:      64.5 ml IVC IVC diam: 2.40 cm LEFT ATRIUM         Index LA diam:    3.40 cm 1.59 cm/m   AORTA Ao Root diam: 4.00 cm Ao Asc diam:  4.90 cm TRICUSPID VALVE TR Peak grad:   27.5 mmHg TR Vmax:        262.00 cm/s  SHUNTS Systemic Diam: 2.30 cm  Oswaldo Milian MD Electronically signed by Oswaldo Milian MD Signature Date/Time: 05/05/2021/3:52:38 PM    Final     Labs:  CBC: Recent Labs    04/29/21 1314 04/30/21 0346 05/05/21 1008  WBC 5.3 3.0* 7.4  HGB 5.8* 6.0* 6.3*  HCT 17.3* 18.7* 18.9*  PLT 223 230 520*    COAGS: Recent Labs    05/05/21 1008  INR 1.3*    BMP: Recent Labs    04/29/21 1314 04/30/21 0346 05/05/21 1008  NA 129* 129* 140  K 4.1 4.7 4.7  CL 100 100 109  CO2 20* 18* 20*  GLUCOSE 229* 286* 143*  BUN 44* 47* 53*  CALCIUM 8.0* 7.7* 8.5*  CREATININE 2.88* 2.76* 2.15*  GFRNONAA 20* 21* 29*    LIVER FUNCTION TESTS: Recent Labs    04/29/21 1314 04/30/21 0346  BILITOT 0.6 1.0  AST 23 23  ALT 14 15  ALKPHOS 88 90  PROT 6.3* 5.9*  ALBUMIN 3.0* 2.7*    Assessment and Plan: Pt with PMH sig for pAF, HLD,CAD with prior PTCA, DM, CKD, anemia, recent COVID 19 PNA, Jehovah's witness, HTN, urinary retention and cholecystitis with GB drain placed at [facility] on 11/3(he previously had choledocholithiasis with biliary sphincterotomy and balloon extraction in 07/2019), cerebrovascular dz with recent TIA/CVA. Now with leaking from GB drain insertion site and abd  film showing prox sideholes extrinsic to liver; plan for cholangiogram with poss drain exchange; d/w pt/pt's daughter with their consent   Electronically Signed: D. Rowe Robert, PA-C 05/06/2021, 9:25 AM   I spent a total of 15 minutes at the the patient's bedside AND on the patient's hospital floor or unit, greater than 50% of which was counseling/coordinating care for gallbladder drain    Patient ID: [name], male   DOB: Sep 20, 1930, 87 y.o.   MRN: [id number]

## 2021-05-06 NOTE — Progress Notes (Signed)
PT Cancellation Note  Patient Details Name: [name] MRN: [id number] DOB: [birth year]   Cancelled Treatment:    Reason Eval/Treat Not Completed: Patient at procedure or test/unavailable. PT will follow up as time allows.   Zenaida Niece 05/06/2021, 5:02 PM

## 2021-05-06 NOTE — Procedures (Signed)
Vascular and Interventional Radiology Procedure Note  Patient: [name] DOB: 07/06/1930 Medical Record Number: [id number] Note Date/Time: 05/06/21 5:16 PM   Performing Physician: Michaelle Birks, MD Assistant(s): None  Diagnosis: Malfunctioning chole tube  Procedure:  CHOLECYSTOSTOMY TUBE EXCHANGE  Anesthesia: Local Anesthetic Complications: None Estimated Blood Loss:  0 mL Specimens:  None  Findings:  Successful exchange and upsize for a 52F cholecystostomy tube.  Plan: Flush tube w 10 mL sterile NS and record drain output qShift. Follow up for routine tube evaluation in 6 week(s).   See detailed procedure note with images in PACS. The patient tolerated the procedure well without incident or complication and was returned to Floor Bed in stable condition.    Michaelle Birks, MD [facility] Radiology   Pager. [place]

## 2021-05-06 NOTE — Progress Notes (Addendum)
Patients blood sugar 67. RN provided two cartons of juice and two fig newtons. MD made aware. Will recheck blood sugar in 15 mins.    1745: 15 min hypoglycemic protocol blood sugar is 80. Patient dinner tray ordered and patient provided snacks.

## 2021-05-06 NOTE — TOC Progression Note (Signed)
Transition of Care [facility]) - Initial/Assessment Note    Patient Details  Name: [name] MRN: [id number] Date of Birth: 02/21/1931  [facility]) CM/SW Contact:    Milinda Antis, [place] Phone Number: 05/06/2021, 12:39 PM  Clinical Narrative:                 Patient continues not to have any bed offers.  Patient is not medically ready to d/c per attending.   CSW will continue to follow.    Expected Discharge Plan: Skilled Nursing Facility Barriers to Discharge: Insurance Authorization, SNF Pending bed offer   Patient Goals and CMS Choice Patient states their goals for this hospitalization and ongoing recovery are:: To receive rehab and return home CMS Medicare.gov Compare Post Acute Care list provided to:: Patient Represenative (must comment) Choice offered to / list presented to : Adult Children  Expected Discharge Plan and Services Expected Discharge Plan: [place] arrangements for the past 2 months: Single Family Home                                      Prior Living Arrangements/Services Living arrangements for the past 2 months: Single Family Home Lives with:: Self Patient language and need for interpreter reviewed:: Yes Do you feel safe going back to the place where you live?: Yes      Need for Family Participation in Patient Care: Yes (Comment) Care giver support system in place?: Yes (comment)   Criminal Activity/Legal Involvement Pertinent to Current Situation/Hospitalization: No - Comment as needed  Activities of Daily Living Home Assistive Devices/Equipment: Walker (specify type) ADL Screening (condition at time of admission) Patient's cognitive ability adequate to safely complete daily activities?: Yes Is the patient deaf or have difficulty hearing?: No Does the patient have difficulty seeing, even when wearing glasses/contacts?: No Does the patient have difficulty concentrating, remembering, or making  decisions?: No Patient able to express need for assistance with ADLs?: Yes Does the patient have difficulty dressing or bathing?: Yes Independently performs ADLs?: No Communication: Independent Dressing (OT): Needs assistance Is this a change from baseline?: Pre-admission baseline Grooming: Needs assistance Is this a change from baseline?: Pre-admission baseline Feeding: Independent Bathing: Needs assistance Is this a change from baseline?: Pre-admission baseline Toileting: Needs assistance Is this a change from baseline?: Pre-admission baseline In/Out Bed: Needs assistance Is this a change from baseline?: Pre-admission baseline Does the patient have difficulty walking or climbing stairs?: Yes Weakness of Legs: Both Weakness of Arms/Hands: None  Permission Sought/Granted   Permission granted to share information with : Yes, Verbal Permission Granted     Permission granted to share info w AGENCY: SNFs        Emotional Assessment       Orientation: : Oriented to Self, Oriented to Place, Oriented to  Time, Oriented to Situation Alcohol / Substance Use: Not Applicable Psych Involvement: No (comment)  Admission diagnosis:  Chronic cholecystitis [K81.1] AKI (acute kidney injury) ([place]) [N17.9] Anemia, unspecified type [D64.9] Chronic kidney disease, unspecified CKD stage [N18.9] COVID-19 [U07.1] Patient Active Problem List   Diagnosis Date Noted   Cerebral thrombosis with cerebral infarction 05/06/2021   Pressure injury of skin 05/06/2021   COVID-19 04/29/2021   DNR (do not resuscitate) 04/29/2021   Accelerated hypertension 11/20/2019   Peripheral arterial disease ([place]) 11/29/2018   Anemia of chronic disease  CKD (chronic kidney disease), stage III (HCC)    Poorly controlled type 2 diabetes mellitus with peripheral neuropathy (HCC)    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) 04/26/2018   Fatigue 04/26/2018   Leukocytosis 04/17/2018   Benign hypertensive heart disease with diastolic CHF, NYHA class 2 ([place]) 04/17/2018   History of suprapubic catheter 04/17/2018   Chronic anemia 04/17/2018   Chest pain 01/28/2018   Pseudomonas urinary tract infection    Hydrocele, right    Olecranon bursitis of left elbow    CKD (chronic kidney disease) stage 3, GFR 30-59 ml/min (HCC)    Suprapubic catheter ([place])    History of urethral stricture    Hemispheric carotid artery syndrome 12/22/2016   AKI (acute kidney injury) ([place]) 03/28/2016   S/P angioplasty with stent 03/26/16 to VG to PDA for in-stent restenosis with DES. 03/28/2016   History of ST elevation myocardial infarction (STEMI) 03/28/2016   Carpal tunnel syndrome of right wrist 06/24/2015   Diabetes mellitus treated with oral medication ([place]) 03/21/2015   Stenosis of left carotid artery    Resistant hypertension    Hyperlipidemia LDL goal <70    Cerebral infarction due to stenosis of left middle cerebral artery ([place]) 01/22/2015   CAD S/P percutaneous coronary angioplasty 09/19/2014   Coronary artery disease involving coronary bypass graft of native heart with angina pectoris ([place]) 09/19/2014   Shortness of breath 09/19/2014   Polypharmacy 09/19/2014   Abdominal aortic aneurysm    History of stroke    Hyperlipidemia associated with type 2 diabetes mellitus ([place])    Type II diabetes mellitus with complication (HCC)    Coronary artery disease involving native coronary artery of native heart with angina pectoris (HCC)    Abnormal TSH 06/19/2014   Obesity 06/19/2014   STEMI (ST elevation myocardial infarction) ([place]) 06/18/2014   NSTEMI (non-ST elevated myocardial infarction) ([place]) 06/17/2014   Bilateral lower extremity edema 03/30/2014   Dizziness  02/21/2014   Bradycardia 08/31/2013   Postoperative atrial fibrillation ([place])     Class: Temporary   Diabetes mellitus type 2 in obese ([place])    Apnea, sleep    Balanitis xerotica obliterans    History of: Non-STEMI (non-ST elevated myocardial infarction) 11/16/2010    Class: History of   S/P CABG x 06-2010 11/16/2010   PCP:  Suzan Garibaldi, FNP Pharmacy:   [facility] [address], [place] - [place] [address] Phone: [phone] Fax: [place], [place] - [place] [address] Phone: [phone] Fax: [phone]  [id number] - [place], [place] [address] Phone: [phone] Fax: [phone]     Social Determinants of Health (SDOH) Interventions    Readmission Risk Interventions No flowsheet data found.

## 2021-05-07 ENCOUNTER — Inpatient Hospital Stay (HOSPITAL_COMMUNITY): Payer: Medicare Other

## 2021-05-07 LAB — GLUCOSE, CAPILLARY
Glucose-Capillary: 124 mg/dL — ABNORMAL HIGH (ref 70–99)
Glucose-Capillary: 125 mg/dL — ABNORMAL HIGH (ref 70–99)
Glucose-Capillary: 146 mg/dL — ABNORMAL HIGH (ref 70–99)
Glucose-Capillary: 182 mg/dL — ABNORMAL HIGH (ref 70–99)
Glucose-Capillary: 80 mg/dL (ref 70–99)

## 2021-05-07 MED ORDER — SODIUM CHLORIDE 0.9% FLUSH
10.0000 mL | Freq: Two times a day (BID) | INTRAVENOUS | Status: DC
  Administered 2021-05-07 – 2021-05-13 (×11): 10 mL

## 2021-05-07 NOTE — Progress Notes (Signed)
Occupational Therapy Treatment Patient Details Name: [name] MRN: [id number] DOB: 20-Apr-1931 Today's Date: 05/07/2021   History of present illness 87 y.o. male presents to [facility] hospital on 04/29/2021 with cough and weakness. Pt recently admitted at [facility] 10/29-11/4 with acute cholecystitis. Since discharge has been unable to ambulate due to weakness. Pt found to be COVID+ and anemic with Hgb of 6.0. PMH includes AAA; OSA; CAD s/p CABG; CVA; chronic combined CHF; DM; HTN; indwelling foley; HLD; Jehovah's witness.   OT comments  Patient received in bed and agreeable to getting to EOB for grooming tasks. Patient was supervision to min guard for balance while sitting on EOB.  Patient asked to transfer to recliner with min assist to stand and for transfer with verbal cues for hand placement and safety. Patient required increased time to perform most tasks and used RUE as gross assist with grooming. Acute OT to continue to follow.    Recommendations for follow up therapy are one component of a multi-disciplinary discharge planning process, led by the attending physician.  Recommendations may be updated based on patient status, additional functional criteria and insurance authorization.    Follow Up Recommendations  Skilled nursing-short term rehab (<3 hours/day)    Assistance Recommended at Discharge Frequent or constant Supervision/Assistance  Equipment Recommendations  Other (comment) (TBD)    Recommendations for Other Services      Precautions / Restrictions Precautions Precautions: Fall Restrictions Weight Bearing Restrictions: No       Mobility Bed Mobility Overal bed mobility: Needs Assistance Bed Mobility: Supine to Sit     Supine to sit: Min assist;HOB elevated     General bed mobility comments: required cues for progressing towards EOB and rail use    Transfers Overall transfer level: Needs assistance Equipment used: Rolling walker (2 wheels) Transfers: Sit to/from  Stand Sit to Stand: Min assist   Step pivot transfers: Min assist       General transfer comment: assistance for balance while standing and verbal cues for hand placment and safety with RW     Balance Overall balance assessment: Needs assistance Sitting-balance support: No upper extremity supported;Feet supported Sitting balance-Leahy Scale: Fair Sitting balance - Comments: required assistance with balance initially due to right lateral leaning but was able to correct with verbal cues Postural control: Right lateral lean Standing balance support: Bilateral upper extremity supported;Reliant on assistive device for balance Standing balance-Leahy Scale: Poor Standing balance comment: min assist for balance while standing with RW                           ADL either performed or assessed with clinical judgement   ADL Overall ADL's : Needs assistance/impaired     Grooming: Wash/dry hands;Wash/dry face;Oral care;Sitting;Minimal assistance Grooming Details (indicate cue type and reason): performed seated on EOB with assistance needed to load toothbrush                               General ADL Comments: patient had difficulty opening toothpaste and applying to brush.  Used RUE as gross assist    Extremity/Trunk Assessment Upper Extremity Assessment Upper Extremity Assessment: RUE deficits/detail RUE Coordination: decreased fine motor;decreased gross motor            Vision       Perception     Praxis      Cognition Arousal/Alertness: Awake/alert Behavior During Therapy: WFL for  tasks assessed/performed Overall Cognitive Status: Within Functional Limits for tasks assessed                                 General Comments: cues for safety and hand placement with transfers          Exercises     Shoulder Instructions       General Comments      Pertinent Vitals/ Pain       Pain Assessment: No/denies pain  Home Living                                           Prior Functioning/Environment              Frequency  Min 2X/week        Progress Toward Goals  OT Goals(current goals can now be found in the care plan section)  Progress towards OT goals: Progressing toward goals  Acute Rehab OT Goals Patient Stated Goal: get better OT Goal Formulation: With patient Time For Goal Achievement: 05/15/21 Potential to Achieve Goals: Good ADL Goals Pt Will Perform Grooming: with min guard assist;standing Pt Will Perform Upper Body Bathing: with supervision;with set-up;sitting Pt Will Perform Lower Body Bathing: with mod assist;sitting/lateral leans Pt Will Perform Upper Body Dressing: with supervision;with set-up;sitting Pt Will Perform Lower Body Dressing: with mod assist Pt Will Transfer to Toilet: with min guard assist;ambulating Pt Will Perform Toileting - Clothing Manipulation and hygiene: with min assist;sit to/from stand  Plan Discharge plan remains appropriate;Frequency remains appropriate    Co-evaluation                 AM-PAC OT "6 Clicks" Daily Activity     Outcome Measure   Help from another person eating meals?: None Help from another person taking care of personal grooming?: A Little Help from another person toileting, which includes using toliet, bedpan, or urinal?: A Lot Help from another person bathing (including washing, rinsing, drying)?: A Lot Help from another person to put on and taking off regular upper body clothing?: A Little Help from another person to put on and taking off regular lower body clothing?: Total 6 Click Score: 15    End of Session Equipment Utilized During Treatment: Gait belt;Rolling walker (2 wheels)  OT Visit Diagnosis: Unsteadiness on feet (R26.81);Other abnormalities of gait and mobility (R26.89);History of falling (Z91.81);Muscle weakness (generalized) (M62.81)   Activity Tolerance Patient tolerated treatment well    Patient Left in chair;with call bell/phone within reach;with chair alarm set   Nurse Communication Mobility status        Time: 1610-9604 OT Time Calculation (min): 26 min  Charges: OT General Charges $OT Visit: 1 Visit OT Treatments $Self Care/Home Management : 23-37 mins  Lodema Hong, [place]  Pager [phone] Office [place] 05/07/2021, 1:08 PM

## 2021-05-07 NOTE — Progress Notes (Signed)
PROGRESS NOTE    [name]  OEU:[id number] DOB: [birth year] DOA: 04/29/2021 PCP: Suzan Garibaldi, FNP    Chief Complaint  Patient presents with   Weakness    Brief Narrative:   [name] is a 87 y.o. male with medical history significant of AAA; OSA; CAD s/p CABG; CVA; chronic combined CHF; DM; HTN; indwelling foley; HLD; Jehovah's witness; and recent cholecystostomy tube placement presenting with cough and generalized weakness.  He was last hospitalized at [facility] from 10/29-11/4 with acute cholecystitis.  Since he is not a surgical candidate due to his generalized conditions as well as anemia with refusal of blood products, he was treated with Zosyn and perc drain placement.  Of note, he previously had choledocholithiasis with biliary sphincterotomy and balloon extraction in 07/2019.  He received IV iron infusion on 10/29 with increase of Hgb from 6.5 to 7.2.  For the last week, he has had cough and worsening fatigue/weakness.  He has been unable to ambulate and his daughter was unable to care for him.  The [facility] aide came today and recommended that the patient be brought in for evaluation. -His work-up in ED was significant for COVID-19 infection, with opacity on imaging, as well with anemia   hemoglobin of 5.8, patient is Jehovah's Witness and does not want any blood product transfusions. -Was started on steroids and remdesivir, no oxygen requirement, steroids were discontinued, he finished his remdesivir with no respiratory symptoms, awaiting SNF placement. -12/19 patient had code stroke called as he was noted to be encephalopathic with worsening right-sided weakness.  I significant for small acute CVA. Subjective:  Appear to have had hypoglycemia yesterday evening, a.m. fasting blood glucose 143 this morning, appears to have inconsistent oral intake due to mental status Confusion, alert to 1 per RN report, per neurology report yesterday he was alert oriented x3 Sediment in the suprapubic  catheter, no fever, does not appear in pain  Assessment & Plan:   Principal Problem:   COVID-19 Active Problems:   Postoperative atrial fibrillation (HCC)   Hyperlipidemia associated with type 2 diabetes mellitus ([place])   CAD S/P percutaneous coronary angioplasty   Diabetes mellitus treated with oral medication (HCC)   AKI (acute kidney injury) ([place])   CKD (chronic kidney disease) stage 3, GFR 30-59 ml/min (HCC)   Suprapubic catheter ([place])   Anemia of chronic disease   DNR (do not resuscitate)   Cerebral thrombosis with cerebral infarction   Pressure injury of skin   COVID-19 pneumonia -Treated with remdesivir and steroid -Currently no hypoxia   Acute CVA -12/19 AM patient with increased confusion and lethargy, worsening right-sided weakness, MRI brain significant for acute CVA  Seen by neurology Dr. Leonie Man who recommend "MRI does show tiny punctate left frontal white matter infarct from small vessel disease.  He does have some right hand weakness which appears chronic from nerve injury in the past.  Recommend aspirin Plavix for 3 weeks and then Plavix alone from stroke standpoint.  May continue long-term dual antiplatelet therapy if necessary from cardiac standpoint.  And aggressive risk factor modification"  Symptomatic anemia, declined blood product due to Jehovah witness Received IV iron and Procrit Minimize blood draw  Percutaneous cholecystectomy drain malfunction -Was hospitalized at [facility] in October for cholecystitis, was deemed not a surgical candidate status post percutaneous cholecystectomy drain -Status post drain placement on December 20s by IR  type 2 diabetes, uncontrolled with hyperglycemia and hypoglycemia A1c 8.5 Home medication metformin and glipizide held Is started on  insulin here , plan for better blood glucose control ,due to acute stroke   Hypertension On multiple blood pressure medication including hydralazine/Imdur, Coreg, Norvasc, Cardura BP stable  currently  CAD status post PCI 2019, CABG 2012 No chest pain Continue home meds  Hyperlipidemia; with history of statin allergy, continue zetia  A. Fib Rate controlled on Coreg High risk for anticoagulation in the setting of severe anemia, currently on aspirin  Urinary retention with history of BXO Indwelling suprapubic catheter, catheter is due for change today Case discussed with urology Dr. Diona Fanti will recommend catheter can be changed by catheter team  AKI Will get renal ultrasound With confusion, will check ua and urine culture after catheter change, daughter is updated with the plan     Nutritional Assessment:  The patients BMI is: Body mass index is 25.86 kg/m.Marland Kitchen  Seen by dietician.  I agree with the assessment and plan as outlined below:  Nutrition Status:        .     Skin Assessment:  I have examined the patients skin and I agree with the wound assessment as performed by the wound care RN as outlined below:  Pressure Injury 05/05/21 Buttocks Right Stage 2 -  Partial thickness loss of dermis presenting as a shallow open injury with a red, pink wound bed without slough. small open area on right buttocks (Active)  05/05/21 1700  Location: Buttocks  Location Orientation: Right  Staging: Stage 2 -  Partial thickness loss of dermis presenting as a shallow open injury with a red, pink wound bed without slough.  Wound Description (Comments): small open area on right buttocks  Present on Admission:     Unresulted Labs (From admission, onward)    None         DVT prophylaxis: heparin injection 5,000 Units Start: 05/06/21 2200 Place and maintain sequential compression device Start: 05/06/21 1438   Code Status: DNR Family Communication: Daughter at bedside Disposition:   Status is: Inpatient  Dispo: The patient is from: Home, lives alone, daughter stays with him currently              Anticipated d/c is to: SNF with palliative care               Anticipated d/c date is: TBD                Consultants:  Phone conversation with urology Dr. Diona Fanti on 12/21 Neurology/stroke team IR  Procedures:  As above  Antimicrobials:   Anti-infectives (From admission, onward)    Start     Dose/Rate Route Frequency Ordered Stop   04/30/21 1000  remdesivir 100 mg in sodium chloride 0.9 % 100 mL IVPB       See Hyperspace for full Linked Orders Report.   100 mg 200 mL/hr over 30 Minutes Intravenous Daily 04/29/21 1640 05/03/21 0930   04/29/21 1645  remdesivir 200 mg in sodium chloride 0.9% 250 mL IVPB       See Hyperspace for full Linked Orders Report.   200 mg 580 mL/hr over 30 Minutes Intravenous Once 04/29/21 1640 04/29/21 1933           Objective: Vitals:   05/06/21 1727 05/06/21 2133 05/07/21 0015 05/07/21 0555  BP: 132/72 (!) 145/74 (!) 141/70 (!) 143/67  Pulse: 68 82 74 75  Resp: 18 18 18 20   Temp: 98.6 F (37 C) (!) 97.5 F (36.4 C) 98.2 F (36.8 C) (!) 97.5 F (36.4 C)  TempSrc:  Oral Oral Oral Oral  SpO2: 98% 95% 93% 93%  Weight:      Height:        Intake/Output Summary (Last 24 hours) at 05/07/2021 0813 Last data filed at 05/07/2021 5188 Gross per 24 hour  Intake 10 ml  Output 1114 ml  Net -1104 ml   Filed Weights   04/29/21 1242 04/30/21 2029 05/04/21 0506  Weight: 92.1 kg 94.9 kg 88.9 kg    Examination:  General exam: Frail elderly, confused, appears only oriented to person, +suprapubic catheter, + gallbladder drain Respiratory system: Clear to auscultation. Respiratory effort normal. Cardiovascular system:  RRR.  Gastrointestinal system: Abdomen is nondistended, soft and nontender.  Normal bowel sounds heard. Central nervous system: Alert , confused , oriented to person only , right sided hemiparesis . Extremities:  no edema Skin: No rashes, lesions or ulcers Psychiatry: Confused ,no agitation.     Data Reviewed: I have personally reviewed following labs and imaging  studies  CBC: Recent Labs  Lab 05/05/21 1008  WBC 7.4  HGB 6.3*  HCT 18.9*  MCV 94.0  PLT 520*    Basic Metabolic Panel: Recent Labs  Lab 05/05/21 1008  NA 140  K 4.7  CL 109  CO2 20*  GLUCOSE 143*  BUN 53*  CREATININE 2.15*  CALCIUM 8.5*    GFR: Estimated Creatinine Clearance: 26.8 mL/min (A) (by C-G formula based on SCr of 2.15 mg/dL (H)).  Liver Function Tests: No results for input(s): AST, ALT, ALKPHOS, BILITOT, PROT, ALBUMIN in the last 168 hours.  CBG: Recent Labs  Lab 05/06/21 1231 05/06/21 1727 05/06/21 1747 05/07/21 0010 05/07/21 0642  GLUCAP 126* 67* 80 146* 125*     Recent Results (from the past 240 hour(s))  Resp Panel by RT-PCR (Flu A&B, Covid) Nasopharyngeal Swab     Status: Abnormal   Collection Time: 04/29/21  1:16 PM   Specimen: Nasopharyngeal Swab; Nasopharyngeal(NP) swabs in vial transport medium  Result Value Ref Range Status   SARS Coronavirus 2 by RT PCR POSITIVE (A) NEGATIVE Final    Comment: (NOTE) SARS-CoV-2 target nucleic acids are DETECTED.  The SARS-CoV-2 RNA is generally detectable in upper respiratory specimens during the acute phase of infection. Positive results are indicative of the presence of the identified virus, but do not rule out bacterial infection or co-infection with other pathogens not detected by the test. Clinical correlation with patient history and other diagnostic information is necessary to determine patient infection status. The expected result is Negative.  Fact Sheet for Patients: EntrepreneurPulse.com.au  Fact Sheet for Healthcare Providers: IncredibleEmployment.be  This test is not yet approved or cleared by the [place] FDA and  has been authorized for detection and/or diagnosis of SARS-CoV-2 by FDA under an Emergency Use Authorization (EUA).  This EUA will remain in effect (meaning this test can be used) for the duration of  the COVID-19 declaration  under Section 564(b)(1) of the A ct, 21 U.S.C. section 360bbb-3(b)(1), unless the authorization is terminated or revoked sooner.     Influenza A by PCR NEGATIVE NEGATIVE Final   Influenza B by PCR NEGATIVE NEGATIVE Final    Comment: (NOTE) The Xpert Xpress SARS-CoV-2/FLU/RSV plus assay is intended as an aid in the diagnosis of influenza from Nasopharyngeal swab specimens and should not be used as a sole basis for treatment. Nasal washings and aspirates are unacceptable for Xpert Xpress SARS-CoV-2/FLU/RSV testing.  Fact Sheet for Patients: EntrepreneurPulse.com.au  Fact Sheet for Healthcare Providers: IncredibleEmployment.be  This test is  not yet approved or cleared by the [place] and has been authorized for detection and/or diagnosis of SARS-CoV-2 by FDA under an Emergency Use Authorization (EUA). This EUA will remain in effect (meaning this test can be used) for the duration of the COVID-19 declaration under Section 564(b)(1) of the Act, 21 U.S.C. section 360bbb-3(b)(1), unless the authorization is terminated or revoked.  Performed at [facility], [place] [address] [place], [place] [address]   Urine Culture     Status: Abnormal   Collection Time: 04/29/21  5:51 PM   Specimen: Urine, Clean Catch  Result Value Ref Range Status   Specimen Description URINE, CLEAN CATCH  Final   Special Requests   Final    NONE Performed at [facility], [place] [address], [place], [place] [address]    Culture >=100,000 COLONIES/mL PSEUDOMONAS AERUGINOSA (A)  Final   Report Status 05/01/2021 FINAL  Final   Organism ID, Bacteria PSEUDOMONAS AERUGINOSA (A)  Final      Susceptibility   Pseudomonas aeruginosa - MIC*    CEFTAZIDIME 4 SENSITIVE Sensitive     CIPROFLOXACIN <=0.25 SENSITIVE Sensitive     GENTAMICIN 2 SENSITIVE Sensitive     IMIPENEM 2 SENSITIVE Sensitive     PIP/TAZO 16 SENSITIVE Sensitive     CEFEPIME 4 SENSITIVE Sensitive      * >=100,000 COLONIES/mL PSEUDOMONAS AERUGINOSA         Radiology Studies: MR ANGIO HEAD WO CONTRAST  Result Date: 05/05/2021 CLINICAL DATA:  Provided history: Neuro deficit, acute, stroke suspected. Cough and generalized weakness/COVID. EXAM: MRI HEAD WITHOUT CONTRAST MRA HEAD WITHOUT CONTRAST TECHNIQUE: Multiplanar, multi-echo pulse sequences of the brain and surrounding structures were acquired without intravenous contrast. Angiographic images of the Circle of Willis were acquired using MRA technique without intravenous contrast. COMPARISON:  Noncontrast head CT 05/05/2021. Report from catheter based angiography 06/06/2018. Report from catheter based angiography 10/26/2018 brain MRI 05/24/2018. CT angiogram head/neck 05/24/2018. FINDINGS: MRI HEAD FINDINGS Brain: Intermittently motion degraded examination, limiting evaluation. Most notably, there is severe motion degradation of the sagittal T1 weighted sequence and moderate/severe motion degradation of the coronal T2 TSE sequence. Mild-to-moderate generalized cerebral atrophy. 3 mm focus of restricted diffusion within the posterior left frontal lobe white matter, compatible with acute infarction (series 5, image 42) (series 4, image 19). Redemonstrated chronic cortical/subcortical infarcts within the left frontal and parietal lobes. Additional known small chronic infarcts within the left occipital lobe, posterior left temporal lobe and posterior right frontal lobe were better appreciated on the prior brain MRI of 05/24/2018 (acute at that time). Redemonstrated chronic lacunar infarcts within the bilateral cerebral hemispheric white matter. Redemonstrated circumscribed foci of T2 hyperintensity within the bilateral basal ganglia, likely reflecting a combination of prominent perivascular spaces and chronic lacunar infarcts. Background mild multifocal T2 FLAIR hyperintense signal abnormality within the cerebral white matter and pons, nonspecific but  compatible with chronic small vessel ischemic disease. Partially empty sella turcica, a nonspecific finding. No evidence of an intracranial mass. No extra-axial fluid collection. No midline shift. Vascular: Reported below. Skull and upper cervical spine: Within the limitations of motion degradation, no focal suspicious marrow lesion is identified. Sinuses/Orbits: Visualized orbits show no acute finding. Bilateral lens replacements. Trace mucosal thickening within the bilateral frontal sinuses. Moderate mucosal thickening within the bilateral ethmoid sinuses. Trace mucosal thickening within the bilateral sphenoid sinuses. Mild mucosal thickening within the bilateral maxillary sinuses. Other: Trace fluid within the bilateral mastoid air cells. MRA HEAD FINDINGS The  examination is moderately motion degraded, limiting evaluation. Anterior circulation: The intracranial internal carotid arteries are patent. There are sites of moderate stenosis within the cavernous/paraclinoid left ICA. The M1 middle cerebral arteries are patent. Redemonstrated known severe stenosis within the distal M1 segment of the left middle cerebral artery. Atherosclerotic irregularity of the M2 and more distal middle cerebral artery vessels, bilaterally. No M2 proximal branch occlusion is identified. The right anterior cerebral artery is patent. Unchanged from the prior catheter based angiogram of 06/06/2018, the left anterior cerebral artery is occluded at the level of the proximal A2 segment. Within the limitations of motion degradation, no intracranial aneurysm is identified. 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 The intracranial left vertebral artery is patent with no more than mild stenosis. Mild atherosclerotic irregularity of the basilar artery (versus motion artifact). The posterior cerebral arteries are patent. Apparent severe stenosis within the distal P2 segment of the right PCA, not  definitively present on prior examinations. Apparent atherosclerotic irregularity of the left PCA. Most notably, there is apparent progressive moderate/severe stenosis within the P1 left PCA. A right posterior communicating artery is present. The left posterior communicating artery is diminutive or absent. Anatomic variants: As described. IMPRESSION: MRI brain: 1. Motion degraded examination, as described and limiting evaluation. 2. 3 mm acute infarct within the posterior left frontal lobe white matter. 3. Known chronic cortical/subcortical infarcts within the left frontal and parietal lobes. Additional known small chronic infarcts within the left occipital lobe, posterior left temporal and posterior right frontal lobe were better appreciated on the prior brain MRI of 05/24/2018 (acute at that time). 4. Redemonstrated chronic lacunar infarcts within the bilateral cerebral hemispheric white matter and basal ganglia. 5. Background mild chronic small vessel ischemic changes within the cerebral white matter and pons, stable. 6. Mild-to-moderate generalized cerebral atrophy. 7. Paranasal sinus disease, as described. 8. Trace fluid within the bilateral mastoid air cells. MRA head: 1. Moderately motion degraded examination, limiting evaluation. 2. Intracranial atherosclerotic disease, most notably as follows. 3. 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 4. Redemonstrated sites of moderate stenosis within the cavernous/paraclinoid left ICA. 5. Redemonstrated severe stenosis within the distal M1 segment of the left MCA. 6. Apparent severe stenosis within the distal P2 right PCA, not definitely present on prior exams. 7. Progressive apparent moderate/severe stenosis within the P1 left PCA. Electronically Signed   By: Kellie Simmering D.O.   On: 05/05/2021 13:43   MR BRAIN WO CONTRAST  Result Date: 05/05/2021 CLINICAL DATA:   Provided history: Neuro deficit, acute, stroke suspected. Cough and generalized weakness/COVID. EXAM: MRI HEAD WITHOUT CONTRAST MRA HEAD WITHOUT CONTRAST TECHNIQUE: Multiplanar, multi-echo pulse sequences of the brain and surrounding structures were acquired without intravenous contrast. Angiographic images of the Circle of Willis were acquired using MRA technique without intravenous contrast. COMPARISON:  Noncontrast head CT 05/05/2021. Report from catheter based angiography 06/06/2018. Report from catheter based angiography 10/26/2018 brain MRI 05/24/2018. CT angiogram head/neck 05/24/2018. FINDINGS: MRI HEAD FINDINGS Brain: Intermittently motion degraded examination, limiting evaluation. Most notably, there is severe motion degradation of the sagittal T1 weighted sequence and moderate/severe motion degradation of the coronal T2 TSE sequence. Mild-to-moderate generalized cerebral atrophy. 3 mm focus of restricted diffusion within the posterior left frontal lobe white matter, compatible with acute infarction (series 5, image 42) (series 4, image 19). Redemonstrated chronic cortical/subcortical infarcts within the left frontal and parietal lobes. Additional  known small chronic infarcts within the left occipital lobe, posterior left temporal lobe and posterior right frontal lobe were better appreciated on the prior brain MRI of 05/24/2018 (acute at that time). Redemonstrated chronic lacunar infarcts within the bilateral cerebral hemispheric white matter. Redemonstrated circumscribed foci of T2 hyperintensity within the bilateral basal ganglia, likely reflecting a combination of prominent perivascular spaces and chronic lacunar infarcts. Background mild multifocal T2 FLAIR hyperintense signal abnormality within the cerebral white matter and pons, nonspecific but compatible with chronic small vessel ischemic disease. Partially empty sella turcica, a nonspecific finding. No evidence of an intracranial mass. No  extra-axial fluid collection. No midline shift. Vascular: Reported below. Skull and upper cervical spine: Within the limitations of motion degradation, no focal suspicious marrow lesion is identified. Sinuses/Orbits: Visualized orbits show no acute finding. Bilateral lens replacements. Trace mucosal thickening within the bilateral frontal sinuses. Moderate mucosal thickening within the bilateral ethmoid sinuses. Trace mucosal thickening within the bilateral sphenoid sinuses. Mild mucosal thickening within the bilateral maxillary sinuses. Other: Trace fluid within the bilateral mastoid air cells. MRA HEAD FINDINGS The examination is moderately motion degraded, limiting evaluation. Anterior circulation: The intracranial internal carotid arteries are patent. There are sites of moderate stenosis within the cavernous/paraclinoid left ICA. The M1 middle cerebral arteries are patent. Redemonstrated known severe stenosis within the distal M1 segment of the left middle cerebral artery. Atherosclerotic irregularity of the M2 and more distal middle cerebral artery vessels, bilaterally. No M2 proximal branch occlusion is identified. The right anterior cerebral artery is patent. Unchanged from the prior catheter based angiogram of 06/06/2018, the left anterior cerebral artery is occluded at the level of the proximal A2 segment. Within the limitations of motion degradation, no intracranial aneurysm is identified. 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 The intracranial left vertebral artery is patent with no more than mild stenosis. Mild atherosclerotic irregularity of the basilar artery (versus motion artifact). The posterior cerebral arteries are patent. Apparent severe stenosis within the distal P2 segment of the right PCA, not definitively present on prior examinations. Apparent atherosclerotic irregularity of the left PCA. Most notably, there is apparent progressive  moderate/severe stenosis within the P1 left PCA. A right posterior communicating artery is present. The left posterior communicating artery is diminutive or absent. Anatomic variants: As described. IMPRESSION: MRI brain: 1. Motion degraded examination, as described and limiting evaluation. 2. 3 mm acute infarct within the posterior left frontal lobe white matter. 3. Known chronic cortical/subcortical infarcts within the left frontal and parietal lobes. Additional known small chronic infarcts within the left occipital lobe, posterior left temporal and posterior right frontal lobe were better appreciated on the prior brain MRI of 05/24/2018 (acute at that time). 4. Redemonstrated chronic lacunar infarcts within the bilateral cerebral hemispheric white matter and basal ganglia. 5. Background mild chronic small vessel ischemic changes within the cerebral white matter and pons, stable. 6. Mild-to-moderate generalized cerebral atrophy. 7. Paranasal sinus disease, as described. 8. Trace fluid within the bilateral mastoid air cells. MRA head: 1. Moderately motion degraded examination, limiting evaluation. 2. Intracranial atherosclerotic disease, most notably as follows. 3. 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 4. Redemonstrated sites of moderate stenosis within the cavernous/paraclinoid left ICA. 5. Redemonstrated severe stenosis within the distal M1 segment of the left MCA. 6. Apparent severe stenosis within the distal P2 right PCA, not definitely present on prior exams. 7. Progressive apparent  moderate/severe stenosis within the P1 left PCA. Electronically Signed   By: Kellie Simmering D.O.   On: 05/05/2021 13:43   DG Abd Portable 1V  Result Date: 05/05/2021 CLINICAL DATA:  Check biliary drainage catheter EXAM: PORTABLE ABDOMEN - 1 VIEW COMPARISON:  04/29/2021 FINDINGS: Scattered large and small bowel gas is noted.  Drainage catheter is noted in the right upper quadrant which is shown to be a percutaneous cholecystostomy tube on recent CT although multiple proximal side holes are noted which are likely within the substance of the liver and extrinsic to the liver margin given its positioning. No free air is seen. No bony abnormality is noted. IMPRESSION: No obstructive changes are seen. Catheter in the right upper quadrant which is shown to be a percutaneous cholecystostomy catheter on recent CT. It demonstrates multiple proximal side holes which are extrinsic to the liver margin. IR consultation and possible evaluation of the catheter on 05/06/2021 is recommended. Electronically Signed   By: Inez Catalina M.D.   On: 05/05/2021 20:49   IR EXCHANGE BILIARY DRAIN  Result Date: 05/06/2021 INDICATION: History of acute cholecystitis, post ultrasound fluoroscopic guided cholecystostomy tube placement on 03/20/2021 at [facility] Reported pericatheter leaking and inadequate drainage. EXAM: FLUOROSCOPIC GUIDED CHOLECYSTOSTOMY TUBE EXCHANGE COMPARISON:  CT AP, 04/29/2021. MEDICATIONS: None ANESTHESIA/SEDATION: None CONTRAST:  50mL OMNIPAQUE IOHEXOL 300 MG/ML SOLN - administered into the gallbladder lumen. FLUOROSCOPY TIME:  1 minute 30 seconds.  6 mGy. COMPLICATIONS: None immediate. PROCEDURE: The patient was positioned supine on the fluoroscopy table. The external portion of the existing cholecystostomy tube as well as the surrounding skin was prepped and draped in usual sterile fashion. A time-out was performed prior to the initiation of the procedure. A preprocedural spot fluoroscopic image was obtained of the right upper abdominal quadrant existing cholecystostomy tube. The skin surrounding the cholecystostomy tube was anesthetized with 1% lidocaine with epinephrine. The external portion of the cholecystostomy tube was cut and cannulated with a short Amplatz wire which was advanced through the tube and coiled within the  gallbladder lumen Next, under intermittent fluoroscopic guidance, the existing 8 Fr cholecystostomy tube was exchanged for a new, slightly larger now 10 Fr cholecystostomy tube which was repositioned into the more central aspect of the gallbladder lumen. Contrast injection confirms appropriate positioning and functionality of the cholecystomy tube. The cholecystostomy tube was flushed with a small amount of saline and reconnected to a gravity bag. The cholecystostomy tube was secured with an interrupted suture and a Stat Lock device. A dressing was applied. The patient tolerated the procedure well without immediate postprocedural complication. FINDINGS: Preprocedural spot fluoroscopic image demonstrates unchanged positioning of cholecystostomy tube with end coiled and locked over the expected location of the fundus of the gallbladder After fluoroscopic guided exchange, the new, slightly larger, now 10 Fr cholecystostomy tube is more ideally positioned with end coiled and locked within the central aspect of the gallbladder lumen. Post exchange cholangiogram demonstrates appropriate positioning and functionality of the new cholecystostomy tube. IMPRESSION: Successful fluoroscopic guided exchange, repositioning and up sizing of now 10 Fr cholecystostomy tube. PLAN: - The patient's cholecystostomy tube was reconnected to a gravity bag. - The patient may return to the interventional radiology drain clinic in 6 weeks for repeat diagnostic cholangiogram. The patient knows to call the interventional radiology drain clinic [phone]) with any interval questions or concerns. Michaelle Birks, MD [facility] Radiology Electronically Signed   By: Michaelle Birks M.D.   On: 05/06/2021 17:28  CT HEAD CODE STROKE WO CONTRAST  Result Date: 05/05/2021 CLINICAL DATA:  Code stroke. Neuro deficit, acute, stroke suspected. Right-sided weakness. EXAM: CT HEAD WITHOUT CONTRAST TECHNIQUE:  Contiguous axial images were obtained from the base of the skull through the vertex without intravenous contrast. COMPARISON:  05/24/2018 FINDINGS: Brain: No focal abnormality affects the brainstem or cerebellum. Both cerebral hemispheres show chronic small-vessel ischemic changes of the white matter. 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 No sign of acute infarction, mass lesion, hemorrhage, hydrocephalus or extra-axial collection. Vascular: There is atherosclerotic calcification of the major vessels at the base of the brain. Skull: Negative Sinuses/Orbits: Clear/normal Other: None ASPECTS ([place] Stroke Program Early CT Score) - Ganglionic level infarction (caudate, lentiform nuclei, internal capsule, insula, M1-M3 cortex): 7 - Supraganglionic infarction (M4-M6 cortex): 3, allowing for the old left parietal infarction. Total score (0-10 with 10 being normal): 10, allowing for the old left parietal infarction. IMPRESSION: 1. No acute CT finding. 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 2. ASPECTS is 10, allowing for the old left parietal infarction. 3. These results were communicated to Dr. Leonie Man at 10:03 am on 05/05/2021 by text page via the [facility] messaging system. Electronically Signed   By: Nelson Chimes M.D.   On: 05/05/2021 10:06   VAS US CAROTID  Result Date: 05/06/2021 Carotid Arterial Duplex Study Patient Name:  [name]  Date of Exam:   05/06/2021 Medical Rec #: [id number]       Accession #:    [id number] Date of Birth: 1930-12-05       Patient Gender: M Patient Age:   53 years Exam Location:  [facility] Procedure:      VAS US CAROTID Referring Phys: PRAMOD SETHI --------------------------------------------------------------------------------  Indications:  TIA. Risk Factors: Hypertension, Diabetes. Limitations   Today's exam was limited due to patient movement. Performing Technologist:  Archie Patten RVS  Examination Guidelines: A complete evaluation includes B-mode imaging, spectral Doppler, color Doppler, and power Doppler as needed of all accessible portions of each vessel. Bilateral testing is considered an integral part of a complete examination. Limited examinations for reoccurring indications may be performed as noted.  Right Carotid Findings: +----------+--------+--------+--------+------------------+--------+             PSV cm/s EDV cm/s Stenosis Plaque Description Comments  +----------+--------+--------+--------+------------------+--------+  CCA Prox   68       11                heterogenous                 +----------+--------+--------+--------+------------------+--------+  CCA Distal 57       15                heterogenous                 +----------+--------+--------+--------+------------------+--------+  ICA Prox   80       24       1-39%    heterogenous                 +----------+--------+--------+--------+------------------+--------+  ICA Distal 48       17                                             +----------+--------+--------+--------+------------------+--------+  ECA  72                                                      +----------+--------+--------+--------+------------------+--------+ +----------+--------+-------+--------+-------------------+             PSV cm/s EDV cms Describe Arm Pressure (mmHG)  +----------+--------+-------+--------+-------------------+  Subclavian 86                                             +----------+--------+-------+--------+-------------------+ +---------+--------+--+--------+--+---------+  Vertebral PSV cm/s 49 EDV cm/s 15 Antegrade  +---------+--------+--+--------+--+---------+  Left Carotid Findings: +----------+--------+--------+--------+------------------+--------+             PSV cm/s EDV cm/s Stenosis Plaque Description Comments  +----------+--------+--------+--------+------------------+--------+  CCA Prox   54       8                  heterogenous                 +----------+--------+--------+--------+------------------+--------+  CCA Distal 51       11                heterogenous                 +----------+--------+--------+--------+------------------+--------+  ICA Prox   47       16       1-39%    heterogenous                 +----------+--------+--------+--------+------------------+--------+  ICA Distal 35       16                                             +----------+--------+--------+--------+------------------+--------+  ECA        89       6                                              +----------+--------+--------+--------+------------------+--------+ +----------+--------+--------+--------+-------------------+             PSV cm/s EDV cm/s Describe Arm Pressure (mmHG)  +----------+--------+--------+--------+-------------------+  Subclavian 88                                              +----------+--------+--------+--------+-------------------+ +---------+--------+--+--------+--+---------+  Vertebral PSV cm/s 86 EDV cm/s 28 Antegrade  +---------+--------+--+--------+--+---------+   Summary: Right Carotid: Velocities in the right ICA are consistent with a 1-39% stenosis. Left Carotid: Velocities in the left ICA are consistent with a 1-39% stenosis. Vertebrals: Bilateral vertebral arteries demonstrate antegrade flow. *See table(s) above for measurements and observations.     Preliminary    ECHOCARDIOGRAM LIMITED  Result Date: 05/05/2021    ECHOCARDIOGRAM LIMITED REPORT   Patient Name:   [name] Date of Exam: 05/05/2021 Medical Rec #:  [id number]      Height:       73.0 in Accession #:  [id number]     Weight:       196.0 lb Date of Birth:  December 28, 1930      BSA:          2.133 m Patient Age:    12 years       BP:           131/71 mmHg Patient Gender: M              HR:           72 bpm. Exam Location:  Inpatient Procedure: 3D Echo, Limited Echo, Cardiac Doppler and Color Doppler Indications:    TIA  History:         Patient has prior history of Echocardiogram examinations, most                 recent 05/25/2018. CAD and Previous Myocardial Infarction,                 Abnormal ECG and Prior CABG, Stroke, Arrythmias:Bradycardia and                 Atrial Fibrillation; Risk Factors:Diabetes, Hypertension,                 Dyslipidemia and Sleep Apnea. Covid positive.  Sonographer:    Roseanna Rainbow RDCS Referring Phys: [place]  1. Left ventricular ejection fraction, by estimation, is 45 to 50%. The left ventricle has mildly decreased function. The left ventricle demonstrates global hypokinesis. There is severe asymmetric left ventricular hypertrophy of the basal-septal segment.  2. Right ventricular systolic function is mildly reduced. The right ventricular size is normal. There is mildly elevated pulmonary artery systolic pressure. The estimated right ventricular systolic pressure is 40.8 mmHg.  3. The mitral valve is normal in structure. Trivial mitral valve regurgitation. No evidence of mitral stenosis.  4. The aortic valve was not well visualized. Aortic valve regurgitation is not visualized. No aortic stenosis is present.  5. The inferior vena cava is dilated in size with <50% respiratory variability, suggesting right atrial pressure of 15 mmHg.  6. Aortic dilatation noted. 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 FINDINGS  Left Ventricle: Left ventricular ejection fraction, by estimation, is 45 to 50%. The left ventricle has mildly decreased function. The left ventricle demonstrates global hypokinesis. The left ventricular internal cavity size was normal in size. There is  severe asymmetric left ventricular hypertrophy of the basal-septal segment. Right Ventricle: The right ventricular size is normal. Right ventricular systolic function is mildly reduced. There is mildly elevated pulmonary artery systolic pressure. The tricuspid  regurgitant velocity is 2.62 m/s, and with an assumed right atrial pressure of 15 mmHg, the estimated right ventricular systolic pressure is 14.4 mmHg. Pericardium: There is no evidence of pericardial effusion. Mitral Valve: The mitral valve is normal in structure. Trivial mitral valve regurgitation. No evidence of mitral valve stenosis. Tricuspid Valve: The tricuspid valve is normal in structure. Tricuspid valve regurgitation is mild. Aortic Valve: The aortic valve was not well visualized. Aortic valve regurgitation is not visualized. No aortic stenosis is present. Aorta: Aortic dilatation noted. There is mild dilatation of the aortic root, measuring 40 mm. There is an aneurysm involving the ascending aorta measuring 49 mm. Venous: The inferior vena cava is dilated in size with less than 50% respiratory variability, suggesting right atrial pressure of 15 mmHg. IAS/Shunts: The interatrial septum was not well visualized.  LEFT VENTRICLE PLAX 2D LVIDd:         5.10 cm LVIDs:         4.00 cm LV PW:         1.30 cm LV IVS:        1.70 cm LVOT diam:     2.30 cm      3D Volume EF: LVOT Area:     4.15 cm     3D EF:        62 %                             LV EDV:       168 ml                             LV ESV:       65 ml LV Volumes (MOD)            LV SV:        104 ml LV vol d, MOD A2C: 163.0 ml LV vol d, MOD A4C: 89.9 ml LV vol s, MOD A2C: 79.8 ml LV vol s, MOD A4C: 39.4 ml LV SV MOD A2C:     83.2 ml LV SV MOD A4C:     89.9 ml LV SV MOD BP:      64.5 ml IVC IVC diam: 2.40 cm LEFT ATRIUM         Index LA diam:    3.40 cm 1.59 cm/m   AORTA Ao Root diam: 4.00 cm Ao Asc diam:  4.90 cm TRICUSPID VALVE TR Peak grad:   27.5 mmHg TR Vmax:        262.00 cm/s  SHUNTS Systemic Diam: 2.30 cm Oswaldo Milian MD Electronically signed by Oswaldo Milian MD Signature Date/Time: 05/05/2021/3:52:38 PM    Final         Scheduled Meds:  amLODipine  10 mg Oral Q1500   vitamin C  500 mg Oral Daily   aspirin  81 mg Oral  Daily   carvedilol  12.5 mg Oral QHS   carvedilol  9.375 mg Oral Daily   clopidogrel  75 mg Oral Daily   darbepoetin (ARANESP) injection - NON-DIALYSIS  150 mcg Subcutaneous Q Wed-1800   docusate sodium  100 mg Oral BID   doxazosin  4 mg Oral Daily   ezetimibe  10 mg Oral Daily   heparin  5,000 Units Subcutaneous Q8H   hydrALAZINE  50 mg Oral Q12H   insulin aspart  0-20 Units Subcutaneous TID WC   insulin aspart  0-5 Units Subcutaneous QHS   insulin glargine-yfgn  10 Units Subcutaneous Daily   isosorbide mononitrate  60 mg Oral BID   pantoprazole  40 mg Oral Daily   sodium chloride flush  10 mL Intracatheter Q12H   sodium chloride flush  3 mL Intravenous Q12H   sodium chloride flush  3 mL Intravenous Q12H   zinc sulfate  220 mg Oral Daily   Continuous Infusions:  sodium chloride     sodium chloride     sodium chloride 50 mL/hr at 05/07/21 0152     LOS: 8 days   Time spent: 73mins Greater than 50% of this time was spent in counseling, explanation of diagnosis, planning of further management, and coordination of care.   Voice Recognition Viviann Spare dictation system was used to create this note, attempts have been  made to correct errors. Please contact the author with questions and/or clarifications.   Florencia Reasons, MD PhD FACP [facility]  Available via Epic secure chat 7am-7pm for nonurgent issues Please page for urgent issues To page the attending provider between 7A-7P or the covering provider during after hours 7P-7A, please log into the web site [email or web address] and access using universal [place] password for that web site. If you do not have the password, please call the hospital operator.    05/07/2021, 8:13 AM

## 2021-05-07 NOTE — Progress Notes (Signed)
Physical Therapy Treatment Patient Details Name: [name] MRN: [id number] DOB: 1930/11/09 Today's Date: 05/07/2021   History of Present Illness 87 y.o. male presents to [facility] hospital on 04/29/2021 with cough and weakness. Pt recently admitted at [facility] 10/29-11/4 with acute cholecystitis. Since discharge has been unable to ambulate due to weakness. Pt found to be COVID+ and anemic with Hgb of 6.0. Code stroke called 05/05/2021. Likely due to TIA but MRI did show tiny punctuate left frontal white matter infarct from small vessel disease. PMH includes AAA; OSA; CAD s/p CABG; CVA; chronic combined CHF; DM; HTN; indwelling foley; HLD; Jehovah's witness.    PT Comments    Pt with some regression towards his physical therapy goals today, requiring increased assist for mobility and displaying acute confusion. Pt requiring mod assist for transfers and ambulating ~25 feet with a walker and min assist. Practiced serial sit to stands from chair and fatigued easily. Pt demonstrates decreased cognition, generalized weakness, impaired balance and decreased endurance. Continue to recommend SNF for ongoing Physical Therapy.      Recommendations for follow up therapy are one component of a multi-disciplinary discharge planning process, led by the attending physician.  Recommendations may be updated based on patient status, additional functional criteria and insurance authorization.  Follow Up Recommendations  Skilled nursing-short term rehab (<3 hours/day)     Assistance Recommended at Discharge Frequent or constant Supervision/Assistance  Equipment Recommendations  None recommended by PT    Recommendations for Other Services       Precautions / Restrictions Precautions Precautions: Fall Restrictions Weight Bearing Restrictions: No     Mobility  Bed Mobility               General bed mobility comments: OOB in chair    Transfers Overall transfer level: Needs assistance Equipment used:  Rolling walker (2 wheels) Transfers: Sit to/from Stand Sit to Stand: Mod assist;Max assist           General transfer comment: Pt requiring moderate assist to rise to stand from chair; with fatigue requiring maxA    Ambulation/Gait Ambulation/Gait assistance: Min assist Gait Distance (Feet): 25 Feet Assistive device: Rolling walker (2 wheels) Gait Pattern/deviations: Step-through pattern;Knee flexed in stance - left;Knee flexed in stance - right;Trunk flexed Gait velocity: decreased Gait velocity interpretation: <1.8 ft/sec, indicate of risk for recurrent falls   General Gait Details: MinA for balance, pt with shortened stride length, bilateral knees flexed in stance, cues provided for environmental navigation. very slow and effortful   Stairs             Wheelchair Mobility    Modified Rankin (Stroke Patients Only)       Balance Overall balance assessment: Needs assistance Sitting-balance support: No upper extremity supported;Feet supported Sitting balance-Leahy Scale: Fair Sitting balance - Comments: required assistance with balance initially due to right lateral leaning but was able to correct with verbal cues   Standing balance support: Bilateral upper extremity supported;Reliant on assistive device for balance Standing balance-Leahy Scale: Poor Standing balance comment: min assist for balance while standing with RW                            Cognition Arousal/Alertness: Awake/alert Behavior During Therapy: Flat affect Overall Cognitive Status: Impaired/Different from baseline Area of Impairment: Following commands;Problem solving;Awareness;Safety/judgement;Memory;Attention                   Current Attention Level: Sustained Memory: Decreased short-term  memory Following Commands: Follows multi-step commands inconsistently Safety/Judgement: Decreased awareness of safety;Decreased awareness of deficits Awareness: Intellectual Problem  Solving: Requires verbal cues General Comments: Acute confusion today, whereas he was A&Ox3 yesterday. MD aware        Exercises General Exercises - Lower Extremity Long Arc Quad: Both;10 reps;Seated Hip Flexion/Marching: Both;10 reps;Seated Other Exercises Other Exercises: x3 sit to stands from chair    General Comments        Pertinent Vitals/Pain Pain Assessment: No/denies pain    Home Living                          Prior Function            PT Goals (current goals can now be found in the care plan section) Acute Rehab PT Goals Patient Stated Goal: to get strength back Potential to Achieve Goals: Fair    Frequency    Min 2X/week      PT Plan Frequency needs to be updated    Co-evaluation              AM-PAC PT "6 Clicks" Mobility   Outcome Measure  Help needed turning from your back to your side while in a flat bed without using bedrails?: A Little Help needed moving from lying on your back to sitting on the side of a flat bed without using bedrails?: A Little Help needed moving to and from a bed to a chair (including a wheelchair)?: A Little Help needed standing up from a chair using your arms (e.g., wheelchair or bedside chair)?: A Lot Help needed to walk in hospital room?: A Little Help needed climbing 3-5 steps with a railing? : Total 6 Click Score: 15    End of Session Equipment Utilized During Treatment: Gait belt Activity Tolerance: Patient tolerated treatment well Patient left: in chair;with call bell/phone within reach;with chair alarm set Nurse Communication: Mobility status PT Visit Diagnosis: Other abnormalities of gait and mobility (R26.89);Muscle weakness (generalized) (M62.81)     Time: 2778-2423 PT Time Calculation (min) (ACUTE ONLY): 23 min  Charges:  $Therapeutic Activity: 23-37 mins                    Wyona Almas, PT, DPT Acute Rehabilitation Services Pager [phone] Office  [phone]     Deno Etienne 05/07/2021, 4:55 PM

## 2021-05-07 NOTE — Progress Notes (Addendum)
1630: MD made aware that catheter team does not know suprapubic catheter size and that they can not exchange without the correct size. Charge RN made aware. This RN called the patients daughter asking for the suprapubic catheter size and she was unaware.   Catheter RN is trying to find a way to find the size.   UA and urine culture unable to be collected without exchange of suprapubic catheter.    1714: Catheter team RN said she could not exchange suprapubic catheter due to not knowing the size. Catheter team RN stated next step is to contact urology on call or wait till am to call his OP urologist to get the suprapubic catheter size.  MD XU made aware. Waiting on response.     1750:Per urologist place 17fr suprapubic catheter. Catheter team made aware.

## 2021-05-08 LAB — URINALYSIS, ROUTINE W REFLEX MICROSCOPIC
Bilirubin Urine: NEGATIVE
Glucose, UA: NEGATIVE mg/dL
Ketones, ur: 5 mg/dL — AB
Nitrite: NEGATIVE
Protein, ur: 100 mg/dL — AB
Specific Gravity, Urine: 1.014 (ref 1.005–1.030)
pH: 5 (ref 5.0–8.0)

## 2021-05-08 LAB — BASIC METABOLIC PANEL
Anion gap: 6 (ref 5–15)
BUN: 44 mg/dL — ABNORMAL HIGH (ref 8–23)
CO2: 19 mmol/L — ABNORMAL LOW (ref 22–32)
Calcium: 7.6 mg/dL — ABNORMAL LOW (ref 8.9–10.3)
Chloride: 114 mmol/L — ABNORMAL HIGH (ref 98–111)
Creatinine, Ser: 2.43 mg/dL — ABNORMAL HIGH (ref 0.61–1.24)
GFR, Estimated: 25 mL/min — ABNORMAL LOW (ref >60)
Glucose, Bld: 170 mg/dL — ABNORMAL HIGH (ref 70–99)
Potassium: 4.2 mmol/L (ref 3.5–5.1)
Sodium: 139 mmol/L (ref 135–145)

## 2021-05-08 LAB — CBC
HCT: 18.1 % — ABNORMAL LOW (ref 39.0–52.0)
Hemoglobin: 6.2 g/dL — CL (ref 13.0–17.0)
MCH: 32.8 pg (ref 26.0–34.0)
MCHC: 34.3 g/dL (ref 30.0–36.0)
MCV: 95.8 fL (ref 80.0–100.0)
Platelets: 423 10*3/uL — ABNORMAL HIGH (ref 150–400)
RBC: 1.89 MIL/uL — ABNORMAL LOW (ref 4.22–5.81)
RDW: 22.5 % — ABNORMAL HIGH (ref 11.5–15.5)
WBC: 5.5 10*3/uL (ref 4.0–10.5)
nRBC: 2 % — ABNORMAL HIGH (ref 0.0–0.2)

## 2021-05-08 LAB — GLUCOSE, CAPILLARY
Glucose-Capillary: 158 mg/dL — ABNORMAL HIGH (ref 70–99)
Glucose-Capillary: 205 mg/dL — ABNORMAL HIGH (ref 70–99)
Glucose-Capillary: 140 mg/dL — ABNORMAL HIGH (ref 70–99)
Glucose-Capillary: 210 mg/dL — ABNORMAL HIGH (ref 70–99)

## 2021-05-08 MED ORDER — FOLIC ACID 1 MG PO TABS
1.0000 mg | ORAL_TABLET | Freq: Every day | ORAL | Status: DC
  Administered 2021-05-09 – 2021-05-13 (×5): 1 mg via ORAL
  Filled 2021-05-08 (×5): qty 1

## 2021-05-08 MED ORDER — SODIUM BICARBONATE 650 MG PO TABS
650.0000 mg | ORAL_TABLET | Freq: Two times a day (BID) | ORAL | Status: DC
  Administered 2021-05-08 – 2021-05-13 (×11): 650 mg via ORAL
  Filled 2021-05-08 (×11): qty 1

## 2021-05-08 MED ORDER — VITAMIN B-12 1000 MCG PO TABS
1000.0000 ug | ORAL_TABLET | Freq: Every day | ORAL | Status: DC
  Administered 2021-05-09 – 2021-05-13 (×5): 1000 ug via ORAL
  Filled 2021-05-08 (×5): qty 1

## 2021-05-08 NOTE — Evaluation (Signed)
Physical Therapy Evaluation Patient Details Name: [name] MRN: [id number] DOB: 1930/08/19 Today's Date: 05/08/2021  History of Present Illness  87 y.o. male presents to [facility] hospital on 04/29/2021 with cough and weakness. Pt recently admitted at [facility] 10/29-11/4 with acute cholecystitis. Since discharge has been unable to ambulate due to weakness. Pt found to be COVID+ and anemic with Hgb of 6.0. Code stroke called 05/05/2021. Likely due to TIA but MRI did show tiny punctuate left frontal white matter infarct from small vessel disease. PMH includes AAA; OSA; CAD s/p CABG; CVA; chronic combined CHF; DM; HTN; indwelling foley; HLD; Jehovah's witness.  Clinical Impression  Pt tolerates treatment well, ambulating for 2 short distances. PT anticipates pt's mobility will be able to progress more quickly when off of COVID restrictions and able to mobilize out of hospital room. Pt remains confused, disoriented to place, time, situation, and demonstrates poor short term memory when re-oriented by PT. Pt will benefit from continued attempts at mobilization along with increased stimulation during the day to both improve mobility quality and aide in reducing confusion. PT continues to recommend SNF placement.       Recommendations for follow up therapy are one component of a multi-disciplinary discharge planning process, led by the attending physician.  Recommendations may be updated based on patient status, additional functional criteria and insurance authorization.  Follow Up Recommendations Skilled nursing-short term rehab (<3 hours/day)    Assistance Recommended at Discharge Frequent or constant Supervision/Assistance  Functional Status Assessment    Equipment Recommendations  None recommended by PT    Recommendations for Other Services       Precautions / Restrictions Precautions Precautions: Fall Restrictions Weight Bearing Restrictions: No      Mobility  Bed Mobility Overal bed  mobility: Needs Assistance Bed Mobility: Supine to Sit     Supine to sit: Min assist     General bed mobility comments: pt requests hand to pull up into sitting    Transfers Overall transfer level: Needs assistance Equipment used: Rolling walker (2 wheels) Transfers: Sit to/from Stand Sit to Stand: Min assist                Ambulation/Gait Ambulation/Gait assistance: Min assist Gait Distance (Feet): 20 Feet (20' x 2, limited space in room to ambulate due to COVID precautions) Assistive device: Rolling walker (2 wheels) Gait Pattern/deviations: Step-through pattern Gait velocity: reduced Gait velocity interpretation: <1.8 ft/sec, indicate of risk for recurrent falls   General Gait Details: pt with slowed step-through gait, mild lateral drift in limited space within room  Stairs            Wheelchair Mobility    Modified Rankin (Stroke Patients Only)       Balance Overall balance assessment: Needs assistance Sitting-balance support: No upper extremity supported;Feet supported Sitting balance-Leahy Scale: Fair     Standing balance support: Bilateral upper extremity supported Standing balance-Leahy Scale: Poor Standing balance comment: reliant on UE support of device                             Pertinent Vitals/Pain Pain Assessment: No/denies pain    Home Living                          Prior Function                       Hand Dominance  Extremity/Trunk Assessment                Communication      Cognition Arousal/Alertness: Awake/alert Behavior During Therapy: WFL for tasks assessed/performed Overall Cognitive Status: Impaired/Different from baseline Area of Impairment: Orientation;Attention;Memory;Following commands;Safety/judgement;Problem solving;Awareness                 Orientation Level: Disoriented to;Place;Time;Situation Current Attention Level: Sustained Memory: Decreased  short-term memory;Decreased recall of precautions Following Commands: Follows one step commands consistently Safety/Judgement: Decreased awareness of safety;Decreased awareness of deficits Awareness: Intellectual Problem Solving: Slow processing          General Comments General comments (skin integrity, edema, etc.): VSS on RA    Exercises     Assessment/Plan    PT Assessment    PT Problem List         PT Treatment Interventions      PT Goals (Current goals can be found in the Care Plan section)  Acute Rehab PT Goals Patient Stated Goal: to get strength back    Frequency Min 2X/week   Barriers to discharge        Co-evaluation               AM-PAC PT "6 Clicks" Mobility  Outcome Measure Help needed turning from your back to your side while in a flat bed without using bedrails?: A Little Help needed moving from lying on your back to sitting on the side of a flat bed without using bedrails?: A Little Help needed moving to and from a bed to a chair (including a wheelchair)?: A Little Help needed standing up from a chair using your arms (e.g., wheelchair or bedside chair)?: A Little Help needed to walk in hospital room?: A Little Help needed climbing 3-5 steps with a railing? : Total 6 Click Score: 16    End of Session   Activity Tolerance: Patient tolerated treatment well Patient left: in chair;with call bell/phone within reach;with chair alarm set Nurse Communication: Mobility status PT Visit Diagnosis: Other abnormalities of gait and mobility (R26.89);Muscle weakness (generalized) (M62.81)    Time: 1275-1700 PT Time Calculation (min) (ACUTE ONLY): 20 min   Charges:     PT Treatments $Gait Training: 8-22 mins        Zenaida Niece, PT, DPT Acute Rehabilitation Pager: [phone] Office [place] 05/08/2021, 5:03 PM

## 2021-05-08 NOTE — Progress Notes (Signed)
PROGRESS NOTE    [name]  ZCH:[id number] DOB: [birth year] DOA: 04/29/2021 PCP: Suzan Garibaldi, FNP    Chief Complaint  Patient presents with   Weakness    Brief Narrative:   [name] is a 87 y.o. male with medical history significant of AAA; OSA; CAD s/p CABG; CVA; chronic combined CHF; DM; HTN; indwelling foley; HLD; Jehovah's witness; and recent cholecystostomy tube placement presenting with cough and generalized weakness.  He was last hospitalized at [facility] from 10/29-11/4 with acute cholecystitis.  Since he is not a surgical candidate due to his generalized conditions as well as anemia with refusal of blood products, he was treated with Zosyn and perc drain placement.  Of note, he previously had choledocholithiasis with biliary sphincterotomy and balloon extraction in 07/2019.  He received IV iron infusion on 10/29 with increase of Hgb from 6.5 to 7.2.  For the last week, he has had cough and worsening fatigue/weakness.  He has been unable to ambulate and his daughter was unable to care for him.  The [facility] aide came today and recommended that the patient be brought in for evaluation. -His work-up in ED was significant for COVID-19 infection, with opacity on imaging, as well with anemia   hemoglobin of 5.8, patient is Jehovah's Witness and does not want any blood product transfusions. -Was started on steroids and remdesivir, no oxygen requirement, steroids were discontinued, he finished his remdesivir with no respiratory symptoms, awaiting SNF placement. -12/19 patient had code stroke called as he was noted to be encephalopathic with worsening right-sided weakness.  I significant for small acute CVA. Subjective:  Uneventful night, he is calm and cooperative, he denies pain He knows he is in [name], cannot tell me the year  Assessment & Plan:   Principal Problem:   COVID-19 Active Problems:   Postoperative atrial fibrillation (HCC)   Hyperlipidemia associated with type 2  diabetes mellitus (HCC)   CAD S/P percutaneous coronary angioplasty   Diabetes mellitus treated with oral medication (HCC)   AKI (acute kidney injury) ([address])   CKD (chronic kidney disease) stage 3, GFR 30-59 ml/min (HCC)   Suprapubic catheter (HCC)   Anemia of chronic disease   DNR (do not resuscitate)   Cerebral thrombosis with cerebral infarction   Pressure injury of skin   COVID-19 pneumonia -Treated with remdesivir and steroid -Currently no hypoxia   Acute CVA -12/19 AM patient with increased confusion and lethargy, worsening right-sided weakness, MRI brain significant for acute CVA  Seen by neurology Dr. Leonie Man who recommend "MRI does show tiny punctate left frontal white matter infarct from small vessel disease.  He does have some right hand weakness which appears chronic from nerve injury in the past.  Recommend aspirin Plavix for 3 weeks and then Plavix alone from stroke standpoint.  May continue long-term dual antiplatelet therapy if necessary from cardiac standpoint.  And aggressive risk factor modification"  Symptomatic anemia, declined blood product due to Jehovah witness Received IV iron and Procrit Minimize blood draw  Percutaneous cholecystectomy drain malfunction -Was hospitalized at [facility] in October for cholecystitis, was deemed not a surgical candidate status post percutaneous cholecystectomy drain -Status post drain placement on December 20s by IR  type 2 diabetes, uncontrolled with hyperglycemia and hypoglycemia A1c 8.5 Home medication metformin and glipizide held Is started on insulin here , plan for better blood glucose control ,due to acute stroke   Hypertension On multiple blood pressure medication including hydralazine/Imdur, Coreg, Norvasc, Cardura BP stable currently  CAD  status post PCI 2019, CABG 2012 No chest pain Continue home meds  Hyperlipidemia; with history of statin allergy, continue zetia  A. Fib Rate controlled on Coreg High risk for  anticoagulation in the setting of severe anemia, currently on aspirin/Plavix  Urinary retention with history of BXO Indwelling suprapubic catheter, catheter changed on 12/21   AKI renal ultrasound no hydronephrosis Urine was cloudy , appear improved after suprapubic catheter exchange  Ua and urine culture in process     Body mass index is 25.86 kg/m..     .     Skin Assessment:  I have examined the patients skin and I agree with the wound assessment as performed by the wound care RN as outlined below:  Pressure Injury 05/05/21 Buttocks Right Stage 2 -  Partial thickness loss of dermis presenting as a shallow open injury with a red, pink wound bed without slough. small open area on right buttocks (Active)  05/05/21 1700  Location: Buttocks  Location Orientation: Right  Staging: Stage 2 -  Partial thickness loss of dermis presenting as a shallow open injury with a red, pink wound bed without slough.  Wound Description (Comments): small open area on right buttocks  Present on Admission:     Unresulted Labs (From admission, onward)     Start     Ordered   05/07/21 1603  Urinalysis, Routine w reflex microscopic Urine, Suprapubic  Once,   R        05/07/21 1602   05/07/21 1144  Urine Culture  Once,   R       Question:  Indication  Answer:  Altered mental status (if no other cause identified)   05/07/21 1144              DVT prophylaxis: heparin injection 5,000 Units Start: 05/06/21 2200 Place and maintain sequential compression device Start: 05/06/21 1438   Code Status: DNR Family Communication: Daughter at bedside on 12/21 Disposition:   Status is: Inpatient  Dispo: The patient is from: Home, lives alone, daughter stays with him currently              Anticipated d/c is to: SNF with palliative care              Anticipated d/c date is: 10days out from last positive covid test, 12/23                Consultants:  Phone conversation with urology Dr.  Diona Fanti on 12/21 Neurology/stroke team IR  Procedures:  As above  Antimicrobials:   Anti-infectives (From admission, onward)    Start     Dose/Rate Route Frequency Ordered Stop   04/30/21 1000  remdesivir 100 mg in sodium chloride 0.9 % 100 mL IVPB       See Hyperspace for full Linked Orders Report.   100 mg 200 mL/hr over 30 Minutes Intravenous Daily 04/29/21 1640 05/03/21 0930   04/29/21 1645  remdesivir 200 mg in sodium chloride 0.9% 250 mL IVPB       See Hyperspace for full Linked Orders Report.   200 mg 580 mL/hr over 30 Minutes Intravenous Once 04/29/21 1640 04/29/21 1933           Objective: Vitals:   05/07/21 1720 05/07/21 2157 05/08/21 0433 05/08/21 0812  BP: 118/61 132/74 (!) 143/60 133/73  Pulse: 61 (!) 56 68 68  Resp: 18 19 20 18   Temp: 97.9 F (36.6 C) 98.2 F (36.8 C) 98.4 F (36.9 C) 98.4 F (  36.9 C)  TempSrc:      SpO2: 96% 96% 97% 95%  Weight:      Height:        Intake/Output Summary (Last 24 hours) at 05/08/2021 1018 Last data filed at 05/08/2021 0800 Gross per 24 hour  Intake 374 ml  Output 1200 ml  Net -826 ml   Filed Weights   04/29/21 1242 04/30/21 2029 05/04/21 0506  Weight: 92.1 kg 94.9 kg 88.9 kg    Examination:  General exam: Frail elderly, appear stronger today but remain confused, appears only oriented to person, +suprapubic catheter, + gallbladder drain Respiratory system: Clear to auscultation. Respiratory effort normal. Cardiovascular system:  RRR.  Gastrointestinal system: Abdomen is nondistended, soft and nontender.  Normal bowel sounds heard. Central nervous system: Alert , confused , oriented to person only , right sided hemiparesis . Extremities:  no edema Skin: No rashes, lesions or ulcers Psychiatry: Confused ,no agitation.     Data Reviewed: I have personally reviewed following labs and imaging studies  CBC: Recent Labs  Lab 05/05/21 1008 05/08/21 0400  WBC 7.4 5.5  HGB 6.3* 6.2*  HCT 18.9* 18.1*   MCV 94.0 95.8  PLT 520* 423*    Basic Metabolic Panel: Recent Labs  Lab 05/05/21 1008 05/08/21 0400  NA 140 139  K 4.7 4.2  CL 109 114*  CO2 20* 19*  GLUCOSE 143* 170*  BUN 53* 44*  CREATININE 2.15* 2.43*  CALCIUM 8.5* 7.6*    GFR: Estimated Creatinine Clearance: 23.7 mL/min (A) (by C-G formula based on SCr of 2.43 mg/dL (H)).  Liver Function Tests: No results for input(s): AST, ALT, ALKPHOS, BILITOT, PROT, ALBUMIN in the last 168 hours.  CBG: Recent Labs  Lab 05/07/21 0642 05/07/21 1152 05/07/21 1716 05/07/21 2158 05/08/21 0639  GLUCAP 125* 80 124* 182* 158*     Recent Results (from the past 240 hour(s))  Resp Panel by RT-PCR (Flu A&B, Covid) Nasopharyngeal Swab     Status: Abnormal   Collection Time: 04/29/21  1:16 PM   Specimen: Nasopharyngeal Swab; Nasopharyngeal(NP) swabs in vial transport medium  Result Value Ref Range Status   SARS Coronavirus 2 by RT PCR POSITIVE (A) NEGATIVE Final    Comment: (NOTE) SARS-CoV-2 target nucleic acids are DETECTED.  The SARS-CoV-2 RNA is generally detectable in upper respiratory specimens during the acute phase of infection. Positive results are indicative of the presence of the identified virus, but do not rule out bacterial infection or co-infection with other pathogens not detected by the test. Clinical correlation with patient history and other diagnostic information is necessary to determine patient infection status. The expected result is Negative.  Fact Sheet for Patients: EntrepreneurPulse.com.au  Fact Sheet for Healthcare Providers: IncredibleEmployment.be  This test is not yet approved or cleared by the [place] FDA and  has been authorized for detection and/or diagnosis of SARS-CoV-2 by FDA under an Emergency Use Authorization (EUA).  This EUA will remain in effect (meaning this test can be used) for the duration of  the COVID-19 declaration under Section  564(b)(1) of the A ct, 21 U.S.C. section 360bbb-3(b)(1), unless the authorization is terminated or revoked sooner.     Influenza A by PCR NEGATIVE NEGATIVE Final   Influenza B by PCR NEGATIVE NEGATIVE Final    Comment: (NOTE) The Xpert Xpress SARS-CoV-2/FLU/RSV plus assay is intended as an aid in the diagnosis of influenza from Nasopharyngeal swab specimens and should not be used as a sole basis for treatment. Nasal washings  and aspirates are unacceptable for Xpert Xpress SARS-CoV-2/FLU/RSV testing.  Fact Sheet for Patients: EntrepreneurPulse.com.au  Fact Sheet for Healthcare Providers: IncredibleEmployment.be  This test is not yet approved or cleared by the [place] FDA and has been authorized for detection and/or diagnosis of SARS-CoV-2 by FDA under an Emergency Use Authorization (EUA). This EUA will remain in effect (meaning this test can be used) for the duration of the COVID-19 declaration under Section 564(b)(1) of the Act, 21 U.S.C. section 360bbb-3(b)(1), unless the authorization is terminated or revoked.  Performed at [facility], [place] [address], [place], [place] [address]   Urine Culture     Status: Abnormal   Collection Time: 04/29/21  5:51 PM   Specimen: Urine, Clean Catch  Result Value Ref Range Status   Specimen Description URINE, CLEAN CATCH  Final   Special Requests   Final    NONE Performed at [facility], [place] [address], [place], [place] [address]    Culture >=100,000 COLONIES/mL PSEUDOMONAS AERUGINOSA (A)  Final   Report Status 05/01/2021 FINAL  Final   Organism ID, Bacteria PSEUDOMONAS AERUGINOSA (A)  Final      Susceptibility   Pseudomonas aeruginosa - MIC*    CEFTAZIDIME 4 SENSITIVE Sensitive     CIPROFLOXACIN <=0.25 SENSITIVE Sensitive     GENTAMICIN 2 SENSITIVE Sensitive     IMIPENEM 2 SENSITIVE Sensitive     PIP/TAZO 16 SENSITIVE Sensitive     CEFEPIME 4 SENSITIVE Sensitive     *  >=100,000 COLONIES/mL PSEUDOMONAS AERUGINOSA         Radiology Studies: US RENAL  Result Date: 05/07/2021 CLINICAL DATA:  Increased creatinine. EXAM: RENAL / URINARY TRACT ULTRASOUND COMPLETE COMPARISON:  CT abdomen and pelvis 04/29/2021 FINDINGS: Right Kidney: Renal measurements: 9.5 x 4.7 x 5.3 cm = volume: 128 mL. Echogenicity is increased. No mass or hydronephrosis visualized. Left Kidney: Renal measurements: 11.4 x 4.9 x 4.5 cm = volume: 130 mL. Echogenicity is increased. No mass or hydronephrosis visualized. Bladder: Not well seen. Other: None. IMPRESSION: 1. Echogenic kidneys likely related to medical renal disease. 2. No hydronephrosis. 3. Bladder not well seen. Electronically Signed   By: Ronney Asters M.D.   On: 05/07/2021 20:46   IR EXCHANGE BILIARY DRAIN  Result Date: 05/06/2021 INDICATION: History of acute cholecystitis, post ultrasound fluoroscopic guided cholecystostomy tube placement on 03/20/2021 at [facility] Reported pericatheter leaking and inadequate drainage. EXAM: FLUOROSCOPIC GUIDED CHOLECYSTOSTOMY TUBE EXCHANGE COMPARISON:  CT AP, 04/29/2021. MEDICATIONS: None ANESTHESIA/SEDATION: None CONTRAST:  68mL OMNIPAQUE IOHEXOL 300 MG/ML SOLN - administered into the gallbladder lumen. FLUOROSCOPY TIME:  1 minute 30 seconds.  6 mGy. COMPLICATIONS: None immediate. PROCEDURE: The patient was positioned supine on the fluoroscopy table. The external portion of the existing cholecystostomy tube as well as the surrounding skin was prepped and draped in usual sterile fashion. A time-out was performed prior to the initiation of the procedure. A preprocedural spot fluoroscopic image was obtained of the right upper abdominal quadrant existing cholecystostomy tube. The skin surrounding the cholecystostomy tube was anesthetized with 1% lidocaine with epinephrine. The external portion of the cholecystostomy tube was cut and cannulated with a short Amplatz wire which was advanced through the  tube and coiled within the gallbladder lumen Next, under intermittent fluoroscopic guidance, the existing 8 Fr cholecystostomy tube was exchanged for a new, slightly larger now 10 Fr cholecystostomy tube which was repositioned into the more central aspect of the gallbladder lumen. Contrast injection confirms appropriate positioning  and functionality of the cholecystomy tube. The cholecystostomy tube was flushed with a small amount of saline and reconnected to a gravity bag. The cholecystostomy tube was secured with an interrupted suture and a Stat Lock device. A dressing was applied. The patient tolerated the procedure well without immediate postprocedural complication. FINDINGS: Preprocedural spot fluoroscopic image demonstrates unchanged positioning of cholecystostomy tube with end coiled and locked over the expected location of the fundus of the gallbladder After fluoroscopic guided exchange, the new, slightly larger, now 10 Fr cholecystostomy tube is more ideally positioned with end coiled and locked within the central aspect of the gallbladder lumen. Post exchange cholangiogram demonstrates appropriate positioning and functionality of the new cholecystostomy tube. IMPRESSION: Successful fluoroscopic guided exchange, repositioning and up sizing of now 10 Fr cholecystostomy tube. PLAN: - The patient's cholecystostomy tube was reconnected to a gravity bag. - The patient may return to the interventional radiology drain clinic in 6 weeks for repeat diagnostic cholangiogram. The patient knows to call the interventional radiology drain clinic [phone]) with any interval questions or concerns. Michaelle Birks, MD [facility] Electronically Signed   By: Michaelle Birks M.D.   On: 05/06/2021 17:28   VAS US CAROTID  Result Date: 05/06/2021 Carotid Arterial Duplex Study Patient Name:  MARCELO ICKES  Date of Exam:   05/06/2021 Medical Rec #: [id number]        Accession #:    [id number] Date of Birth: 11-11-1930       Patient Gender: M Patient Age:   35 years Exam Location:  [facility] Procedure:      VAS US CAROTID Referring Phys: PRAMOD SETHI --------------------------------------------------------------------------------  Indications:  TIA. Risk Factors: Hypertension, Diabetes. Limitations   Today's exam was limited due to patient movement. Performing Technologist: Archie Patten RVS  Examination Guidelines: A complete evaluation includes B-mode imaging, spectral Doppler, color Doppler, and power Doppler as needed of all accessible portions of each vessel. Bilateral testing is considered an integral part of a complete examination. Limited examinations for reoccurring indications may be performed as noted.  Right Carotid Findings: +----------+--------+--------+--------+------------------+--------+             PSV cm/s EDV cm/s Stenosis Plaque Description Comments  +----------+--------+--------+--------+------------------+--------+  CCA Prox   68       11                heterogenous                 +----------+--------+--------+--------+------------------+--------+  CCA Distal 57       15                heterogenous                 +----------+--------+--------+--------+------------------+--------+  ICA Prox   80       24       1-39%    heterogenous                 +----------+--------+--------+--------+------------------+--------+  ICA Distal 48       17                                             +----------+--------+--------+--------+------------------+--------+  ECA        72                                                      +----------+--------+--------+--------+------------------+--------+ +----------+--------+-------+--------+-------------------+  PSV cm/s EDV cms Describe Arm Pressure (mmHG)  +----------+--------+-------+--------+-------------------+  Subclavian 86                                              +----------+--------+-------+--------+-------------------+ +---------+--------+--+--------+--+---------+  Vertebral PSV cm/s 49 EDV cm/s 15 Antegrade  +---------+--------+--+--------+--+---------+  Left Carotid Findings: +----------+--------+--------+--------+------------------+--------+             PSV cm/s EDV cm/s Stenosis Plaque Description Comments  +----------+--------+--------+--------+------------------+--------+  CCA Prox   54       8                 heterogenous                 +----------+--------+--------+--------+------------------+--------+  CCA Distal 51       11                heterogenous                 +----------+--------+--------+--------+------------------+--------+  ICA Prox   47       16       1-39%    heterogenous                 +----------+--------+--------+--------+------------------+--------+  ICA Distal 35       16                                             +----------+--------+--------+--------+------------------+--------+  ECA        89       6                                              +----------+--------+--------+--------+------------------+--------+ +----------+--------+--------+--------+-------------------+             PSV cm/s EDV cm/s Describe Arm Pressure (mmHG)  +----------+--------+--------+--------+-------------------+  Subclavian 88                                              +----------+--------+--------+--------+-------------------+ +---------+--------+--+--------+--+---------+  Vertebral PSV cm/s 86 EDV cm/s 28 Antegrade  +---------+--------+--+--------+--+---------+   Summary: Right Carotid: Velocities in the right ICA are consistent with a 1-39% stenosis. Left Carotid: Velocities in the left ICA are consistent with a 1-39% stenosis. Vertebrals: Bilateral vertebral arteries demonstrate antegrade flow. *See table(s) above for measurements and observations.     Preliminary         Scheduled Meds:  amLODipine  10 mg Oral Q1500   vitamin C  500 mg Oral Daily    aspirin  81 mg Oral Daily   carvedilol  12.5 mg Oral QHS   carvedilol  9.375 mg Oral Daily   clopidogrel  75 mg Oral Daily   darbepoetin (ARANESP) injection - NON-DIALYSIS  150 mcg Subcutaneous Q Wed-1800   docusate sodium  100 mg Oral BID   doxazosin  4 mg Oral Daily   ezetimibe  10 mg Oral Daily   heparin  5,000 Units Subcutaneous Q8H   hydrALAZINE  50 mg Oral Q12H   insulin aspart  0-20  Units Subcutaneous TID WC   insulin aspart  0-5 Units Subcutaneous QHS   isosorbide mononitrate  60 mg Oral BID   pantoprazole  40 mg Oral Daily   sodium bicarbonate  650 mg Oral BID   sodium chloride flush  10 mL Intracatheter Q12H   sodium chloride flush  3 mL Intravenous Q12H   sodium chloride flush  3 mL Intravenous Q12H   zinc sulfate  220 mg Oral Daily   Continuous Infusions:  sodium chloride     sodium chloride     sodium chloride 50 mL/hr at 05/07/21 0152     LOS: 9 days   Time spent: 70mins Greater than 50% of this time was spent in counseling, explanation of diagnosis, planning of further management, and coordination of care.   Voice Recognition Viviann Spare dictation system was used to create this note, attempts have been made to correct errors. Please contact the author with questions and/or clarifications.   Florencia Reasons, MD PhD FACP [facility]  Available via Epic secure chat 7am-7pm for nonurgent issues Please page for urgent issues To page the attending provider between 7A-7P or the covering provider during after hours 7P-7A, please log into the web site [email or web address] and access using universal [place] password for that web site. If you do not have the password, please call the hospital operator.    05/08/2021, 10:18 AM

## 2021-05-08 NOTE — TOC Progression Note (Addendum)
Transition of Care [facility]) - Initial/Assessment Note    Patient Details  Name: [name] MRN: [id number] Date of Birth: Sep 09, 1930  Transition of Care [facility]) CM/SW Contact:    Milinda Antis, [place] Phone Number: 05/08/2021, 1:11 PM  Clinical Narrative:                 CSW spoke with the patient's daughter and presented bed offers as the patient is only oriented to person.  The daughter is in agreement with the patient going to [facility] as it is the only facility that has tentatively offered.    CSW called [facility] to inquire about the facility accepting now that the patient is out of the COVID precautions window.  There was no answer.  CSW left a VM requesting a returned call.    14:30-  CSW informed that [facility] can no longer accept patient due to medical complexities.  Leadership notified.  CSW directed to send referral to [facility] in [place] as they may be taken patients who are not vaccinated.  TOC Barrier: No SNF bed offers  Expected Discharge Plan: [place] Barriers to Discharge: Ship broker, SNF Pending bed offer   Patient Goals and CMS Choice Patient states their goals for this hospitalization and ongoing recovery are:: To receive rehab and return home CMS Medicare.gov Compare Post Acute Care list provided to:: Patient Represenative (must comment) Choice offered to / list presented to : Adult Children  Expected Discharge Plan and Services Expected Discharge Plan: [place] arrangements for the past 2 months: Single Family Home                                      Prior Living Arrangements/Services Living arrangements for the past 2 months: Single Family Home Lives with:: Self Patient language and need for interpreter reviewed:: Yes Do you feel safe going back to the place where you live?: Yes      Need for Family Participation in Patient Care: Yes (Comment) Care giver support system  in place?: Yes (comment)   Criminal Activity/Legal Involvement Pertinent to Current Situation/Hospitalization: No - Comment as needed  Activities of Daily Living Home Assistive Devices/Equipment: Walker (specify type) ADL Screening (condition at time of admission) Patient's cognitive ability adequate to safely complete daily activities?: Yes Is the patient deaf or have difficulty hearing?: No Does the patient have difficulty seeing, even when wearing glasses/contacts?: No Does the patient have difficulty concentrating, remembering, or making decisions?: No Patient able to express need for assistance with ADLs?: Yes Does the patient have difficulty dressing or bathing?: Yes Independently performs ADLs?: No Communication: Independent Dressing (OT): Needs assistance Is this a change from baseline?: Pre-admission baseline Grooming: Needs assistance Is this a change from baseline?: Pre-admission baseline Feeding: Independent Bathing: Needs assistance Is this a change from baseline?: Pre-admission baseline Toileting: Needs assistance Is this a change from baseline?: Pre-admission baseline In/Out Bed: Needs assistance Is this a change from baseline?: Pre-admission baseline Does the patient have difficulty walking or climbing stairs?: Yes Weakness of Legs: Both Weakness of Arms/Hands: None  Permission Sought/Granted   Permission granted to share information with : Yes, Verbal Permission Granted     Permission granted to share info w AGENCY: SNFs        Emotional Assessment       Orientation: : Oriented to Self,  Oriented to Place, Oriented to  Time, Oriented to Situation Alcohol / Substance Use: Not Applicable Psych Involvement: No (comment)  Admission diagnosis:  Chronic cholecystitis [K81.1] AKI (acute kidney injury) (National) [N17.9] Anemia, unspecified type [D64.9] Chronic kidney disease, unspecified CKD stage [N18.9] COVID-19 [U07.1] Patient Active Problem List   Diagnosis  Date Noted   Cerebral thrombosis with cerebral infarction 05/06/2021   Pressure injury of skin 05/06/2021   COVID-19 04/29/2021   DNR (do not resuscitate) 04/29/2021   Accelerated hypertension 11/20/2019   Peripheral arterial disease ([place]) 11/29/2018   Anemia of chronic disease    CKD (chronic kidney disease), stage III (HCC)    Poorly controlled type 2 diabetes mellitus with peripheral neuropathy (HCC)    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   Vitamin D deficiency 05/27/2018   Acute CVA (cerebrovascular accident) ([place]) 05/25/2018   Right arm weakness 05/25/2018   Right sided weakness    History of CVA with residual deficit    Acute blood loss anemia    Dilated cardiomyopathy ([place]) 04/26/2018   Fatigue 04/26/2018   Leukocytosis 04/17/2018   Benign hypertensive heart disease with diastolic CHF, NYHA class 2 ([place]) 04/17/2018   History of suprapubic catheter 04/17/2018   Chronic anemia 04/17/2018   Chest pain 01/28/2018   Pseudomonas urinary tract infection    Hydrocele, right    Olecranon bursitis of left elbow    CKD (chronic kidney disease) stage 3, GFR 30-59 ml/min (HCC)    Suprapubic catheter ([place])    History of urethral stricture    Hemispheric carotid artery syndrome 12/22/2016   AKI (acute kidney injury) ([place]) 03/28/2016   S/P angioplasty with stent 03/26/16 to VG to PDA for in-stent restenosis with DES. 03/28/2016   History of ST elevation myocardial infarction (STEMI) 03/28/2016   Carpal tunnel syndrome of right wrist 06/24/2015   Diabetes mellitus treated with oral medication ([place]) 03/21/2015   Stenosis of left carotid artery    Resistant hypertension    Hyperlipidemia LDL goal <70    Cerebral infarction due to stenosis of left middle cerebral artery ([place]) 01/22/2015   CAD S/P percutaneous coronary angioplasty 09/19/2014   Coronary artery disease involving coronary bypass graft of native heart with angina pectoris ([place]) 09/19/2014   Shortness of breath  09/19/2014   Polypharmacy 09/19/2014   Abdominal aortic aneurysm    History of stroke    Hyperlipidemia associated with type 2 diabetes mellitus ([place])    Type II diabetes mellitus with complication ([place])    Coronary artery disease involving native coronary artery of native heart with angina pectoris (HCC)    Abnormal TSH 06/19/2014   Obesity 06/19/2014   STEMI (ST elevation myocardial infarction) ([place]) 06/18/2014   NSTEMI (non-ST elevated myocardial infarction) ([place]) 06/17/2014   Bilateral lower extremity edema 03/30/2014   Dizziness 02/21/2014   Bradycardia 08/31/2013   Postoperative atrial fibrillation ([place])     Class: Temporary   Diabetes mellitus type 2 in obese ([place])    Apnea, sleep    Balanitis xerotica obliterans    History of: Non-STEMI (non-ST elevated myocardial infarction) 11/16/2010    Class: History of   S/P CABG x 06-2010 11/16/2010   PCP:  Suzan Garibaldi, FNP Pharmacy:   [facility] [address], [place] [address] Phone: [phone] Fax: [place], [place] [address] Phone: [phone] Fax: [phone]  [id number] [place], [place] [address] Phone: [phone] Fax: [phone]     Social Determinants of Health (SDOH) Interventions    Readmission Risk Interventions No flowsheet data found.

## 2021-05-09 LAB — GLUCOSE, CAPILLARY
Glucose-Capillary: 149 mg/dL — ABNORMAL HIGH (ref 70–99)
Glucose-Capillary: 129 mg/dL — ABNORMAL HIGH (ref 70–99)
Glucose-Capillary: 246 mg/dL — ABNORMAL HIGH (ref 70–99)
Glucose-Capillary: 211 mg/dL — ABNORMAL HIGH (ref 70–99)

## 2021-05-09 MED ORDER — SODIUM CHLORIDE 0.9 % IV SOLN
1.0000 g | INTRAVENOUS | Status: DC
  Administered 2021-05-09 – 2021-05-13 (×5): 1 g via INTRAVENOUS
  Filled 2021-05-09 (×6): qty 1

## 2021-05-09 NOTE — TOC Progression Note (Signed)
Transition of Care [facility]) - Progression Note    Patient Details  Name: [name] MRN: [id number] Date of Birth: 1931-02-03  Transition of Care [facility]) CM/SW [place], LCSW Phone Number: 05/09/2021, 5:20 PM  Clinical Narrative:    Patient is without SNF bed offers. Will continue search.    Expected Discharge Plan: Skilled Nursing Facility Barriers to Discharge: Ship broker, SNF Pending bed offer  Expected Discharge Plan and Services Expected Discharge Plan: [place] arrangements for the past 2 months: Single Family Home                                       Social Determinants of Health (SDOH) Interventions    Readmission Risk Interventions No flowsheet data found.

## 2021-05-09 NOTE — Progress Notes (Signed)
PROGRESS NOTE    [name]  VQQ:[id number] DOB: 05/01/1931 DOA: 04/29/2021 PCP: Suzan Garibaldi, FNP    Chief Complaint  Patient presents with   Weakness    Brief Narrative:   [name] is a 87 y.o. male with medical history significant of AAA; OSA; CAD s/p CABG; CVA; chronic combined CHF; DM; HTN; indwelling foley; HLD; Jehovah's witness; and recent cholecystostomy tube placement presenting with cough and generalized weakness.  He was last hospitalized at [facility] from 10/29-11/4 with acute cholecystitis.  Since he is not a surgical candidate due to his generalized conditions as well as anemia with refusal of blood products, he was treated with Zosyn and perc drain placement.  Of note, he previously had choledocholithiasis with biliary sphincterotomy and balloon extraction in 07/2019.  He received IV iron infusion on 10/29 with increase of Hgb from 6.5 to 7.2.  For the last week, he has had cough and worsening fatigue/weakness.  He has been unable to ambulate and his daughter was unable to care for him.  The [facility] aide came today and recommended that the patient be brought in for evaluation. -His work-up in ED was significant for COVID-19 infection, with opacity on imaging, as well with anemia   hemoglobin of 5.8, patient is Jehovah's Witness and does not want any blood product transfusions. -Was started on steroids and remdesivir, no oxygen requirement, steroids were discontinued, he finished his remdesivir with no respiratory symptoms, awaiting SNF placement. -12/19 patient had code stroke called as he was noted to be encephalopathic with worsening right-sided weakness.  I significant for small acute CVA. Subjective:  Continue to be confused, daughter states this is not his basline Urine continue to be cloudy with sediment after new suprapubic catheter placed,  He is pleasantly confused,  he denies pain, there is no fever   Assessment & Plan:   Principal Problem:   COVID-19 Active  Problems:   Postoperative atrial fibrillation (HCC)   Hyperlipidemia associated with type 2 diabetes mellitus (HCC)   CAD S/P percutaneous coronary angioplasty   Diabetes mellitus treated with oral medication (HCC)   AKI (acute kidney injury) ([place])   CKD (chronic kidney disease) stage 3, GFR 30-59 ml/min (HCC)   Suprapubic catheter (HCC)   Anemia of chronic disease   DNR (do not resuscitate)   Cerebral thrombosis with cerebral infarction   Pressure injury of skin   COVID-19 pneumonia -Treated with remdesivir and steroid -Currently no hypoxia  -out of isolation on 12/23  Acute CVA -12/19 AM patient with increased confusion and lethargy, worsening right-sided weakness, MRI brain significant for acute CVA  Seen by neurology Dr. Leonie Man who recommend "MRI does show tiny punctate left frontal white matter infarct from small vessel disease.  He does have some right hand weakness which appears chronic from nerve injury in the past.  Recommend aspirin Plavix for 3 weeks and then Plavix alone from stroke standpoint.  May continue long-term dual antiplatelet therapy if necessary from cardiac standpoint.  And aggressive risk factor modification"  Symptomatic anemia, declined blood product due to Jehovah witness Received IV iron and Procrit Minimize blood draw  Percutaneous cholecystectomy drain malfunction -Was hospitalized at [facility] in October for cholecystitis, was deemed not a surgical candidate status post percutaneous cholecystectomy drain -Status post drain placement on December 20s by IR  type 2 diabetes, uncontrolled with hyperglycemia and hypoglycemia A1c 8.5 Home medication metformin and glipizide held Is started on insulin here , plan for better blood glucose control ,due  to acute stroke   Hypertension On multiple blood pressure medication including hydralazine/Imdur, Coreg, Norvasc, Cardura BP stable currently  CAD status post PCI 2019, CABG 2012 No chest pain Continue home  meds  Hyperlipidemia; with history of statin allergy, continue zetia  A. Fib Rate controlled on Coreg High risk for anticoagulation in the setting of severe anemia, currently on aspirin/Plavix  Urinary retention with history of BXO Indwelling suprapubic catheter, catheter changed on 12/21   AKI renal ultrasound no hydronephrosis Urine was cloudy , appear improved after suprapubic catheter exchange  urine culture after suprapubic catheter + pseudomonas Start abx, plan for total of 7 days treatment, can transition to oral at discharge     Body mass index is 27.52 kg/m..     .     Skin Assessment:  I have examined the patients skin and I agree with the wound assessment as performed by the wound care RN as outlined below:  Pressure Injury 05/05/21 Buttocks Right Stage 2 -  Partial thickness loss of dermis presenting as a shallow open injury with a red, pink wound bed without slough. small open area on right buttocks (Active)  05/05/21 1700  Location: Buttocks  Location Orientation: Right  Staging: Stage 2 -  Partial thickness loss of dermis presenting as a shallow open injury with a red, pink wound bed without slough.  Wound Description (Comments): small open area on right buttocks  Present on Admission:     Unresulted Labs (From admission, onward)    None         DVT prophylaxis: heparin injection 5,000 Units Start: 05/06/21 2200 Place and maintain sequential compression device Start: 05/06/21 1438   Code Status: DNR Family Communication: Daughter at bedside on 12/21, over the phone on 12/23 Disposition:   Status is: Inpatient  Dispo: The patient is from: Home, lives alone, daughter stays with him currently              Anticipated d/c is to: SNF with palliative care              Anticipated d/c date is: awaiting for SNF bed,                 Consultants:  Phone conversation with urology Dr. Diona Fanti on 12/21 Neurology/stroke team IR  Procedures:   As above  Antimicrobials:   Anti-infectives (From admission, onward)    Start     Dose/Rate Route Frequency Ordered Stop   04/30/21 1000  remdesivir 100 mg in sodium chloride 0.9 % 100 mL IVPB       See Hyperspace for full Linked Orders Report.   100 mg 200 mL/hr over 30 Minutes Intravenous Daily 04/29/21 1640 05/03/21 0930   04/29/21 1645  remdesivir 200 mg in sodium chloride 0.9% 250 mL IVPB       See Hyperspace for full Linked Orders Report.   200 mg 580 mL/hr over 30 Minutes Intravenous Once 04/29/21 1640 04/29/21 1933           Objective: Vitals:   05/08/21 2029 05/09/21 0453 05/09/21 0541 05/09/21 0801  BP: 136/60 129/62  (!) 142/64  Pulse: 66 (!) 50  87  Resp: 19 20  16   Temp: 97.6 F (36.4 C) 98.1 F (36.7 C)  97.8 F (36.6 C)  TempSrc: Oral Oral    SpO2: 94% 93%  94%  Weight:   94.6 kg   Height:        Intake/Output Summary (Last 24 hours) at 05/09/2021  [place] filed at 05/09/2021 0600 Gross per 24 hour  Intake 4222.07 ml  Output 700 ml  Net 3522.07 ml   Filed Weights   04/30/21 2029 05/04/21 0506 05/09/21 0541  Weight: 94.9 kg 88.9 kg 94.6 kg    Examination:  General exam: Frail elderly, appear stronger today but remain confused, appears only oriented to person, +suprapubic catheter, + gallbladder drain Respiratory system: Clear to auscultation. Respiratory effort normal. Cardiovascular system:  RRR.  Gastrointestinal system: Abdomen is nondistended, soft and nontender.  Normal bowel sounds heard. Central nervous system: Alert , confused , oriented to person only , right sided hemiparesis . Extremities:  no edema Skin: No rashes, lesions or ulcers Psychiatry: Confused ,no agitation.     Data Reviewed: I have personally reviewed following labs and imaging studies  CBC: Recent Labs  Lab 05/05/21 1008 05/08/21 0400  WBC 7.4 5.5  HGB 6.3* 6.2*  HCT 18.9* 18.1*  MCV 94.0 95.8  PLT 520* 423*    Basic Metabolic Panel: Recent  Labs  Lab 05/05/21 1008 05/08/21 0400  NA 140 139  K 4.7 4.2  CL 109 114*  CO2 20* 19*  GLUCOSE 143* 170*  BUN 53* 44*  CREATININE 2.15* 2.43*  CALCIUM 8.5* 7.6*    GFR: Estimated Creatinine Clearance: 23.7 mL/min (A) (by C-G formula based on SCr of 2.43 mg/dL (H)).  Liver Function Tests: No results for input(s): AST, ALT, ALKPHOS, BILITOT, PROT, ALBUMIN in the last 168 hours.  CBG: Recent Labs  Lab 05/08/21 1128 05/08/21 1656 05/08/21 2028 05/09/21 0641 05/09/21 1125  GLUCAP 205* 140* 210* 149* 129*     Recent Results (from the past 240 hour(s))  Resp Panel by RT-PCR (Flu A&B, Covid) Nasopharyngeal Swab     Status: Abnormal   Collection Time: 04/29/21  1:16 PM   Specimen: Nasopharyngeal Swab; Nasopharyngeal(NP) swabs in vial transport medium  Result Value Ref Range Status   SARS Coronavirus 2 by RT PCR POSITIVE (A) NEGATIVE Final    Comment: (NOTE) SARS-CoV-2 target nucleic acids are DETECTED.  The SARS-CoV-2 RNA is generally detectable in upper respiratory specimens during the acute phase of infection. Positive results are indicative of the presence of the identified virus, but do not rule out bacterial infection or co-infection with other pathogens not detected by the test. Clinical correlation with patient history and other diagnostic information is necessary to determine patient infection status. The expected result is Negative.  Fact Sheet for Patients: EntrepreneurPulse.com.au  Fact Sheet for Healthcare Providers: IncredibleEmployment.be  This test is not yet approved or cleared by the [place] FDA and  has been authorized for detection and/or diagnosis of SARS-CoV-2 by FDA under an Emergency Use Authorization (EUA).  This EUA will remain in effect (meaning this test can be used) for the duration of  the COVID-19 declaration under Section 564(b)(1) of the A ct, 21 U.S.C. section 360bbb-3(b)(1), unless the  authorization is terminated or revoked sooner.     Influenza A by PCR NEGATIVE NEGATIVE Final   Influenza B by PCR NEGATIVE NEGATIVE Final    Comment: (NOTE) The Xpert Xpress SARS-CoV-2/FLU/RSV plus assay is intended as an aid in the diagnosis of influenza from Nasopharyngeal swab specimens and should not be used as a sole basis for treatment. Nasal washings and aspirates are unacceptable for Xpert Xpress SARS-CoV-2/FLU/RSV testing.  Fact Sheet for Patients: EntrepreneurPulse.com.au  Fact Sheet for Healthcare Providers: IncredibleEmployment.be  This test is not yet approved or cleared by the [place] FDA  and has been authorized for detection and/or diagnosis of SARS-CoV-2 by FDA under an Emergency Use Authorization (EUA). This EUA will remain in effect (meaning this test can be used) for the duration of the COVID-19 declaration under Section 564(b)(1) of the Act, 21 U.S.C. section 360bbb-3(b)(1), unless the authorization is terminated or revoked.  Performed at [facility], [place] [address], [place], [place] [address]   Urine Culture     Status: Abnormal   Collection Time: 04/29/21  5:51 PM   Specimen: Urine, Clean Catch  Result Value Ref Range Status   Specimen Description URINE, CLEAN CATCH  Final   Special Requests   Final    NONE Performed at [facility], [place] [address] [place], [place] [address]    Culture >=100,000 COLONIES/mL PSEUDOMONAS AERUGINOSA (A)  Final   Report Status 05/01/2021 FINAL  Final   Organism ID, Bacteria PSEUDOMONAS AERUGINOSA (A)  Final      Susceptibility   Pseudomonas aeruginosa - MIC*    CEFTAZIDIME 4 SENSITIVE Sensitive     CIPROFLOXACIN <=0.25 SENSITIVE Sensitive     GENTAMICIN 2 SENSITIVE Sensitive     IMIPENEM 2 SENSITIVE Sensitive     PIP/TAZO 16 SENSITIVE Sensitive     CEFEPIME 4 SENSITIVE Sensitive     * >=100,000 COLONIES/mL PSEUDOMONAS AERUGINOSA  Urine Culture     Status:  Abnormal (Preliminary result)   Collection Time: 05/08/21  9:09 AM   Specimen: Urine, Suprapubic  Result Value Ref Range Status   Specimen Description URINE, SUPRAPUBIC  Final   Special Requests NONE  Final   Culture (A)  Final    >=100,000 COLONIES/mL GRAM NEGATIVE RODS CULTURE REINCUBATED FOR BETTER GROWTH Performed at [facility] [address], [place], [place] [address]    Report Status PENDING  Incomplete         Radiology Studies: US RENAL  Result Date: 05/07/2021 CLINICAL DATA:  Increased creatinine. EXAM: RENAL / URINARY TRACT ULTRASOUND COMPLETE COMPARISON:  CT abdomen and pelvis 04/29/2021 FINDINGS: Right Kidney: Renal measurements: 9.5 x 4.7 x 5.3 cm = volume: 128 mL. Echogenicity is increased. No mass or hydronephrosis visualized. Left Kidney: Renal measurements: 11.4 x 4.9 x 4.5 cm = volume: 130 mL. Echogenicity is increased. No mass or hydronephrosis visualized. Bladder: Not well seen. Other: None. IMPRESSION: 1. Echogenic kidneys likely related to medical renal disease. 2. No hydronephrosis. 3. Bladder not well seen. Electronically Signed   By: Ronney Asters M.D.   On: 05/07/2021 20:46        Scheduled Meds:  amLODipine  10 mg Oral Q1500   vitamin C  500 mg Oral Daily   aspirin  81 mg Oral Daily   carvedilol  12.5 mg Oral QHS   carvedilol  9.375 mg Oral Daily   clopidogrel  75 mg Oral Daily   darbepoetin (ARANESP) injection - NON-DIALYSIS  150 mcg Subcutaneous Q Wed-1800   docusate sodium  100 mg Oral BID   doxazosin  4 mg Oral Daily   ezetimibe  10 mg Oral Daily   folic acid  1 mg Oral Daily   heparin  5,000 Units Subcutaneous Q8H   hydrALAZINE  50 mg Oral Q12H   insulin aspart  0-20 Units Subcutaneous TID WC   insulin aspart  0-5 Units Subcutaneous QHS   isosorbide mononitrate  60 mg Oral BID   pantoprazole  40 mg Oral Daily   sodium bicarbonate  650 mg Oral BID   sodium  chloride flush  10 mL Intracatheter Q12H   sodium chloride flush  3 mL  Intravenous Q12H   sodium chloride flush  3 mL Intravenous Q12H   vitamin B-12  1,000 mcg Oral Daily   zinc sulfate  220 mg Oral Daily   Continuous Infusions:  sodium chloride     sodium chloride     sodium chloride 50 mL/hr at 05/09/21 0814     LOS: 10 days   Time spent: 50mins Greater than 50% of this time was spent in counseling, explanation of diagnosis, planning of further management, and coordination of care.   Voice Recognition Viviann Spare dictation system was used to create this note, attempts have been made to correct errors. Please contact the author with questions and/or clarifications.   Florencia Reasons, MD PhD FACP [facility]  Available via Epic secure chat 7am-7pm for nonurgent issues Please page for urgent issues To page the attending provider between 7A-7P or the covering provider during after hours 7P-7A, please log into the web site [email or web address] and access using universal [place] password for that web site. If you do not have the password, please call the hospital operator.    05/09/2021, 11:40 AM

## 2021-05-09 NOTE — Plan of Care (Signed)
  Problem: Education: Goal: Knowledge of General Education information will improve Description Including pain rating scale, medication(s)/side effects and non-pharmacologic comfort measures Outcome: Progressing   

## 2021-05-10 LAB — GLUCOSE, CAPILLARY
Glucose-Capillary: 169 mg/dL — ABNORMAL HIGH (ref 70–99)
Glucose-Capillary: 141 mg/dL — ABNORMAL HIGH (ref 70–99)
Glucose-Capillary: 162 mg/dL — ABNORMAL HIGH (ref 70–99)
Glucose-Capillary: 114 mg/dL — ABNORMAL HIGH (ref 70–99)

## 2021-05-10 LAB — URINE CULTURE: Culture: >=100000 — AB

## 2021-05-10 MED ORDER — FLUTICASONE PROPIONATE 50 MCG/ACT NA SUSP
2.0000 | Freq: Every day | NASAL | Status: DC
  Administered 2021-05-10 – 2021-05-13 (×4): 2 via NASAL
  Filled 2021-05-10: qty 16

## 2021-05-10 MED ORDER — SACCHAROMYCES BOULARDII 250 MG PO CAPS
250.0000 mg | ORAL_CAPSULE | Freq: Two times a day (BID) | ORAL | Status: DC
  Administered 2021-05-10 – 2021-05-13 (×7): 250 mg via ORAL
  Filled 2021-05-10 (×7): qty 1

## 2021-05-10 MED ORDER — GUAIFENESIN ER 600 MG PO TB12
600.0000 mg | ORAL_TABLET | Freq: Two times a day (BID) | ORAL | Status: DC
  Administered 2021-05-10 – 2021-05-13 (×7): 600 mg via ORAL
  Filled 2021-05-10 (×7): qty 1

## 2021-05-10 NOTE — Plan of Care (Signed)
°  Problem: Education: Goal: Knowledge of General Education information will improve Description: Including pain rating scale, medication(s)/side effects and non-pharmacologic comfort measures Outcome: Progressing   Problem: Clinical Measurements: Goal: Ability to maintain clinical measurements within normal limits will improve Outcome: Progressing Goal: Will remain free from infection Outcome: Progressing Goal: Respiratory complications will improve Outcome: Progressing   Problem: Safety: Goal: Ability to remain free from injury will improve Outcome: Progressing

## 2021-05-10 NOTE — Progress Notes (Signed)
PROGRESS NOTE    [name]  XIP:[id number] DOB: 01/03/1931 DOA: 04/29/2021 PCP: Suzan Garibaldi, FNP    Chief Complaint  Patient presents with   Weakness    Brief Narrative:   [name] is a 87 y.o. male with medical history significant of AAA; OSA; CAD s/p CABG; CVA; chronic combined CHF; DM; HTN; indwelling foley; HLD; Jehovah's witness; and recent cholecystostomy tube placement presenting with cough and generalized weakness.  He was last hospitalized at [facility] from 10/29-11/4 with acute cholecystitis.  Since he is not a surgical candidate due to his generalized conditions as well as anemia with refusal of blood products, he was treated with Zosyn and perc drain placement.  Of note, he previously had choledocholithiasis with biliary sphincterotomy and balloon extraction in 07/2019.  He received IV iron infusion on 10/29 with increase of Hgb from 6.5 to 7.2.  For the last week, he has had cough and worsening fatigue/weakness.  He has been unable to ambulate and his daughter was unable to care for him.  The [facility] aide came today and recommended that the patient be brought in for evaluation. -His work-up in ED was significant for COVID-19 infection, with opacity on imaging, as well with anemia   hemoglobin of 5.8, patient is Jehovah's Witness and does not want any blood product transfusions. -Was started on steroids and remdesivir, no oxygen requirement, steroids were discontinued, he finished his remdesivir with no respiratory symptoms, awaiting SNF placement. -12/19 patient had code stroke called as he was noted to be encephalopathic with worsening right-sided weakness.  I significant for small acute CVA. Subjective:  He is pleasantly confused, report sinus congestion  Has intermittent congested cough  No short of breath  On room air  He denies pain  No fever  No edema Daughter at bedside   Assessment & Plan:   Principal Problem:   COVID-19 Active Problems:   Postoperative atrial  fibrillation (HCC)   Hyperlipidemia associated with type 2 diabetes mellitus ([place])   CAD S/P percutaneous coronary angioplasty   Diabetes mellitus treated with oral medication (HCC)   AKI (acute kidney injury) ([place])   CKD (chronic kidney disease) stage 3, GFR 30-59 ml/min (HCC)   Suprapubic catheter (HCC)   Anemia of chronic disease   DNR (do not resuscitate)   Cerebral thrombosis with cerebral infarction   Pressure injury of skin   COVID-19 pneumonia -Treated with remdesivir and steroid -Currently no hypoxia  -out of isolation on 12/23  Acute CVA -12/19 AM patient with increased confusion and lethargy, worsening right-sided weakness, MRI brain significant for acute CVA  Seen by neurology Dr. Leonie Man who recommend "MRI does show tiny punctate left frontal white matter infarct from small vessel disease.  He does have some right hand weakness which appears chronic from nerve injury in the past.  Recommend aspirin Plavix for 3 weeks and then Plavix alone from stroke standpoint.  May continue long-term dual antiplatelet therapy if necessary from cardiac standpoint.  And aggressive risk factor modification"  Symptomatic anemia, declined blood product due to Jehovah witness Received IV iron and Procrit Minimize blood draw  Percutaneous cholecystectomy drain malfunction -Was hospitalized at [facility] in October for cholecystitis, was deemed not a surgical candidate status post percutaneous cholecystectomy drain -Status post drain placement on December 20s by IR  type 2 diabetes, uncontrolled with hyperglycemia and hypoglycemia A1c 8.5 Home medication metformin and glipizide held Is started on insulin here , plan for better blood glucose control ,due to acute  stroke   Hypertension On multiple blood pressure medication including hydralazine/Imdur, Coreg, Norvasc, Cardura BP stable currently  CAD status post PCI 2019, CABG 2012 No chest pain Continue home meds  Hyperlipidemia; with history  of statin allergy, continue zetia  A. Fib Rate controlled on Coreg High risk for anticoagulation in the setting of severe anemia, currently on aspirin/Plavix  Urinary retention with history of BXO Indwelling suprapubic catheter, catheter changed on 12/21   AKI renal ultrasound no hydronephrosis Urine was cloudy , appear improved after suprapubic catheter exchange  urine culture after suprapubic catheter + pseudomonas Start abx, plan for total of 7 days treatment, can transition to oral at discharge  Sinus congestion Start Flonase and Mucinex     Body mass index is 27.52 kg/m..     .     Skin Assessment:  I have examined the patients skin and I agree with the wound assessment as performed by the wound care RN as outlined below:  Pressure Injury 05/05/21 Buttocks Right Stage 2 -  Partial thickness loss of dermis presenting as a shallow open injury with a red, pink wound bed without slough. small open area on right buttocks (Active)  05/05/21 1700  Location: Buttocks  Location Orientation: Right  Staging: Stage 2 -  Partial thickness loss of dermis presenting as a shallow open injury with a red, pink wound bed without slough.  Wound Description (Comments): small open area on right buttocks  Present on Admission:     Unresulted Labs (From admission, onward)    None         DVT prophylaxis: heparin injection 5,000 Units Start: 05/06/21 2200 Place and maintain sequential compression device Start: 05/06/21 1438   Code Status: DNR Family Communication: Daughter at bedside on 12/21, over the phone on 12/23, daughter at bedside on 12/24 Disposition:   Status is: Inpatient  Dispo: The patient is from: Home, lives alone, daughter stays with him currently              Anticipated d/c is to: SNF with palliative care              Anticipated d/c date is: awaiting for SNF bed,                 Consultants:  Phone conversation with urology Dr. Diona Fanti on  12/21 Neurology/stroke team IR  Procedures:  As above  Antimicrobials:   Anti-infectives (From admission, onward)    Start     Dose/Rate Route Frequency Ordered Stop   05/09/21 1300  cefTAZidime (FORTAZ) 1 g in sodium chloride 0.9 % 100 mL IVPB        1 g 200 mL/hr over 30 Minutes Intravenous Every 24 hours 05/09/21 1144 05/16/21 1259   04/30/21 1000  remdesivir 100 mg in sodium chloride 0.9 % 100 mL IVPB       See Hyperspace for full Linked Orders Report.   100 mg 200 mL/hr over 30 Minutes Intravenous Daily 04/29/21 1640 05/03/21 0930   04/29/21 1645  remdesivir 200 mg in sodium chloride 0.9% 250 mL IVPB       See Hyperspace for full Linked Orders Report.   200 mg 580 mL/hr over 30 Minutes Intravenous Once 04/29/21 1640 04/29/21 1933           Objective: Vitals:   05/09/21 1627 05/09/21 2112 05/10/21 0537 05/10/21 1104  BP: (!) 147/66 135/81 137/66 (!) 141/88  Pulse: 77 79 65 62  Resp: 17 15 17  16  Temp: 98.6 F (37 C) 98.4 F (36.9 C) 98 F (36.7 C) (!) 97.3 F (36.3 C)  TempSrc:   Oral Oral  SpO2: 98% 93% 92% 95%  Weight:      Height:        Intake/Output Summary (Last 24 hours) at 05/10/2021 1921 Last data filed at 05/10/2021 1900 Gross per 24 hour  Intake 370 ml  Output 1420 ml  Net -1050 ml   Filed Weights   04/30/21 2029 05/04/21 0506 05/09/21 0541  Weight: 94.9 kg 88.9 kg 94.6 kg    Examination:  General exam: Frail elderly, appear stronger today but remain confused, appears only oriented to person, +suprapubic catheter, + gallbladder drain Respiratory system: Clear to auscultation. Respiratory effort normal. Cardiovascular system:  RRR.  Gastrointestinal system: Abdomen is nondistended, soft and nontender.  Normal bowel sounds heard. Central nervous system: Alert , confused , oriented to person only , right sided hemiparesis . Extremities:  no edema Skin: No rashes, lesions or ulcers Psychiatry: Confused ,no agitation.     Data  Reviewed: I have personally reviewed following labs and imaging studies  CBC: Recent Labs  Lab 05/05/21 1008 05/08/21 0400  WBC 7.4 5.5  HGB 6.3* 6.2*  HCT 18.9* 18.1*  MCV 94.0 95.8  PLT 520* 423*    Basic Metabolic Panel: Recent Labs  Lab 05/05/21 1008 05/08/21 0400  NA 140 139  K 4.7 4.2  CL 109 114*  CO2 20* 19*  GLUCOSE 143* 170*  BUN 53* 44*  CREATININE 2.15* 2.43*  CALCIUM 8.5* 7.6*    GFR: Estimated Creatinine Clearance: 23.7 mL/min (A) (by C-G formula based on SCr of 2.43 mg/dL (H)).  Liver Function Tests: No results for input(s): AST, ALT, ALKPHOS, BILITOT, PROT, ALBUMIN in the last 168 hours.  CBG: Recent Labs  Lab 05/09/21 1622 05/09/21 2114 05/10/21 0840 05/10/21 1148 05/10/21 1636  GLUCAP 246* 211* 169* 141* 162*     Recent Results (from the past 240 hour(s))  Urine Culture     Status: Abnormal   Collection Time: 05/08/21  9:09 AM   Specimen: Urine, Suprapubic  Result Value Ref Range Status   Specimen Description URINE, SUPRAPUBIC  Final   Special Requests   Final    NONE Performed at [facility], [place] [address], [place], [place] [address]    Culture >=100,000 COLONIES/mL PSEUDOMONAS AERUGINOSA (A)  Final   Report Status 05/10/2021 FINAL  Final   Organism ID, Bacteria PSEUDOMONAS AERUGINOSA (A)  Final      Susceptibility   Pseudomonas aeruginosa - MIC*    CEFTAZIDIME 4 SENSITIVE Sensitive     CIPROFLOXACIN <=0.25 SENSITIVE Sensitive     GENTAMICIN 4 SENSITIVE Sensitive     IMIPENEM 2 SENSITIVE Sensitive     CEFEPIME 4 SENSITIVE Sensitive     * >=100,000 COLONIES/mL PSEUDOMONAS AERUGINOSA         Radiology Studies: No results found.      Scheduled Meds:  amLODipine  10 mg Oral Q1500   vitamin C  500 mg Oral Daily   aspirin  81 mg Oral Daily   carvedilol  12.5 mg Oral QHS   carvedilol  9.375 mg Oral Daily   clopidogrel  75 mg Oral Daily   darbepoetin (ARANESP) injection - NON-DIALYSIS  150 mcg Subcutaneous Q  Wed-1800   docusate sodium  100 mg Oral BID   doxazosin  4 mg Oral Daily   ezetimibe  10 mg Oral Daily   fluticasone  2 spray Each Nare Daily   folic acid  1 mg Oral Daily   guaiFENesin  600 mg Oral BID   heparin  5,000 Units Subcutaneous Q8H   hydrALAZINE  50 mg Oral Q12H   insulin aspart  0-20 Units Subcutaneous TID WC   insulin aspart  0-5 Units Subcutaneous QHS   isosorbide mononitrate  60 mg Oral BID   pantoprazole  40 mg Oral Daily   saccharomyces boulardii  250 mg Oral BID   sodium bicarbonate  650 mg Oral BID   sodium chloride flush  10 mL Intracatheter Q12H   sodium chloride flush  3 mL Intravenous Q12H   sodium chloride flush  3 mL Intravenous Q12H   vitamin B-12  1,000 mcg Oral Daily   zinc sulfate  220 mg Oral Daily   Continuous Infusions:  sodium chloride     cefTAZidime (FORTAZ)  IV 1 g (05/10/21 1336)     LOS: 11 days   Time spent: 93mins Greater than 50% of this time was spent in counseling, explanation of diagnosis, planning of further management, and coordination of care.   Voice Recognition Viviann Spare dictation system was used to create this note, attempts have been made to correct errors. Please contact the author with questions and/or clarifications.   Florencia Reasons, MD PhD FACP [facility]  Available via Epic secure chat 7am-7pm for nonurgent issues Please page for urgent issues To page the attending provider between 7A-7P or the covering provider during after hours 7P-7A, please log into the web site [email or web address] and access using universal [place] password for that web site. If you do not have the password, please call the hospital operator.    05/10/2021, 7:21 PM

## 2021-05-11 LAB — GLUCOSE, CAPILLARY
Glucose-Capillary: 118 mg/dL — ABNORMAL HIGH (ref 70–99)
Glucose-Capillary: 122 mg/dL — ABNORMAL HIGH (ref 70–99)
Glucose-Capillary: 152 mg/dL — ABNORMAL HIGH (ref 70–99)
Glucose-Capillary: 211 mg/dL — ABNORMAL HIGH (ref 70–99)
Glucose-Capillary: 151 mg/dL — ABNORMAL HIGH (ref 70–99)

## 2021-05-11 MED ORDER — SENNOSIDES-DOCUSATE SODIUM 8.6-50 MG PO TABS
1.0000 | ORAL_TABLET | Freq: Two times a day (BID) | ORAL | Status: DC
  Administered 2021-05-11 – 2021-05-13 (×4): 1 via ORAL
  Filled 2021-05-11 (×4): qty 1

## 2021-05-11 MED ORDER — POLYETHYLENE GLYCOL 3350 17 G PO PACK
17.0000 g | PACK | Freq: Every day | ORAL | Status: DC
  Administered 2021-05-11 – 2021-05-13 (×3): 17 g via ORAL
  Filled 2021-05-11 (×3): qty 1

## 2021-05-11 NOTE — Progress Notes (Signed)
PROGRESS NOTE    [name]  BDZ:[id number] DOB: November 04, 1930 DOA: 04/29/2021 PCP: Suzan Garibaldi, FNP    Chief Complaint  Patient presents with   Weakness    Brief Narrative:   [name] is a 87 y.o. male with medical history significant of AAA; OSA; CAD s/p CABG; CVA; chronic combined CHF; DM; HTN; indwelling foley; HLD; Jehovah's witness; and recent cholecystostomy tube placement presenting with cough and generalized weakness.  He was last hospitalized at [facility] from 10/29-11/4 with acute cholecystitis.  Since he is not a surgical candidate due to his generalized conditions as well as anemia with refusal of blood products, he was treated with Zosyn and perc drain placement.  Of note, he previously had choledocholithiasis with biliary sphincterotomy and balloon extraction in 07/2019.  He received IV iron infusion on 10/29 with increase of Hgb from 6.5 to 7.2.  For the last week, he has had cough and worsening fatigue/weakness.  He has been unable to ambulate and his daughter was unable to care for him.  The [facility] aide came today and recommended that the patient be brought in for evaluation. -His work-up in ED was significant for COVID-19 infection, with opacity on imaging, as well with anemia   hemoglobin of 5.8, patient is Jehovah's Witness and does not want any blood product transfusions. -Was started on steroids and remdesivir, no oxygen requirement, steroids were discontinued, he finished his remdesivir with no respiratory symptoms, awaiting SNF placement. -12/19 patient had code stroke called as he was noted to be encephalopathic with worsening right-sided weakness.  found to have small acute CVA.  medically stable to discharge, awaiting for SNF bed,   Subjective:  Uneventful night  he is pleasantly confused, I do not hear any cough today during encounter, he is on room air No short of breath  He denies pain  No fever  No edema  medically stable to discharge, awaiting for SNF  bed,   Assessment & Plan:   Principal Problem:   COVID-19 Active Problems:   Postoperative atrial fibrillation (HCC)   Hyperlipidemia associated with type 2 diabetes mellitus (HCC)   CAD S/P percutaneous coronary angioplasty   Diabetes mellitus treated with oral medication (HCC)   AKI (acute kidney injury) ([place])   CKD (chronic kidney disease) stage 3, GFR 30-59 ml/min (HCC)   Suprapubic catheter (HCC)   Anemia of chronic disease   DNR (do not resuscitate)   Cerebral thrombosis with cerebral infarction   Pressure injury of skin   COVID-19 pneumonia -Treated with remdesivir and steroid -Currently no hypoxia  -out of isolation on 12/23  Acute CVA -12/19 AM patient with increased confusion and lethargy, worsening right-sided weakness, MRI brain significant for acute CVA  -Seen by neurology Dr. Leonie Man who recommend "MRI does show tiny punctate left frontal white matter infarct from small vessel disease.  He does have some right hand weakness which appears chronic from nerve injury in the past.  Recommend aspirin Plavix for 3 weeks and then Plavix alone from stroke standpoint.  May continue long-term dual antiplatelet therapy if necessary from cardiac standpoint.  And aggressive risk factor modification"  Symptomatic anemia, declined blood product due to Jehovah witness Received IV iron and Procrit Minimize blood draw No overt sign of external bleed  Percutaneous cholecystectomy drain malfunction -Was hospitalized at [facility] in October for cholecystitis, was deemed not a surgical candidate status post percutaneous cholecystectomy drain -Status post drain placement on December 20s by IR  type 2 diabetes, uncontrolled  with hyperglycemia and hypoglycemia A1c 8.5 Home medication metformin and glipizide held Is started on insulin here , plan for better blood glucose control ,due to acute stroke   Hypertension On multiple blood pressure medication including hydralazine/Imdur, Coreg,  Norvasc, Cardura BP stable currently  CAD status post PCI 2019, CABG 2012 No chest pain Continue home meds  Hyperlipidemia; with history of statin allergy, continue zetia  A. Fib Rate controlled on Coreg High risk for anticoagulation in the setting of severe anemia, currently on aspirin/Plavix  Urinary retention with history of BXO Indwelling suprapubic catheter, catheter changed on 12/21   AKI renal ultrasound no hydronephrosis Urine was cloudy , appear improved after suprapubic catheter exchange  urine culture after suprapubic catheter + pseudomonas Start abx, plan for total of 7 days treatment, can transition to oral abx at discharge  Sinus congestion Start Flonase and Mucinex     Body mass index is 27.52 kg/m..     .     Skin Assessment:  I have examined the patients skin and I agree with the wound assessment as performed by the wound care RN as outlined below:  Pressure Injury 05/05/21 Buttocks Right Stage 2 -  Partial thickness loss of dermis presenting as a shallow open injury with a red, pink wound bed without slough. small open area on right buttocks (Active)  05/05/21 1700  Location: Buttocks  Location Orientation: Right  Staging: Stage 2 -  Partial thickness loss of dermis presenting as a shallow open injury with a red, pink wound bed without slough.  Wound Description (Comments): small open area on right buttocks  Present on Admission:     Unresulted Labs (From admission, onward)    None         DVT prophylaxis: heparin injection 5,000 Units Start: 05/06/21 2200 Place and maintain sequential compression device Start: 05/06/21 1438   Code Status: DNR Family Communication: Daughter at bedside on 12/21, over the phone on 12/23, daughter at bedside on 12/24 Disposition:   Status is: Inpatient  Dispo: The patient is from: Home, lives alone, daughter stays with him currently              Anticipated d/c is to: SNF with palliative care               Anticipated d/c date is: medically stable to discharge, awaiting for SNF bed,                 Consultants:  Phone conversation with urology Dr. Diona Fanti on 12/21 Neurology/stroke team IR  Procedures:  As above  Antimicrobials:   Anti-infectives (From admission, onward)    Start     Dose/Rate Route Frequency Ordered Stop   05/09/21 1300  cefTAZidime (FORTAZ) 1 g in sodium chloride 0.9 % 100 mL IVPB        1 g 200 mL/hr over 30 Minutes Intravenous Every 24 hours 05/09/21 1144 05/16/21 1259   04/30/21 1000  remdesivir 100 mg in sodium chloride 0.9 % 100 mL IVPB       See Hyperspace for full Linked Orders Report.   100 mg 200 mL/hr over 30 Minutes Intravenous Daily 04/29/21 1640 05/03/21 0930   04/29/21 1645  remdesivir 200 mg in sodium chloride 0.9% 250 mL IVPB       See Hyperspace for full Linked Orders Report.   200 mg 580 mL/hr over 30 Minutes Intravenous Once 04/29/21 1640 04/29/21 1933           Objective:  Vitals:   05/10/21 1943 05/11/21 0541 05/11/21 0800 05/11/21 1600  BP: (!) 152/64 132/71 (!) 144/70 135/80  Pulse: 88 84 64 66  Resp: 18 18 18 16   Temp: 97.6 F (36.4 C) 97.6 F (36.4 C) 98.1 F (36.7 C) 97.6 F (36.4 C)  TempSrc: Oral Oral Oral Oral  SpO2: 95% 94% 94% 97%  Weight:      Height:        Intake/Output Summary (Last 24 hours) at 05/11/2021 1736 Last data filed at 05/11/2021 0830 Gross per 24 hour  Intake 1486.38 ml  Output 1070 ml  Net 416.38 ml   Filed Weights   04/30/21 2029 05/04/21 0506 05/09/21 0541  Weight: 94.9 kg 88.9 kg 94.6 kg    Examination:  General exam: Frail elderly, alert, awake, pleasantly confused, only oriented to person,  +suprapubic catheter, + gallbladder drain Respiratory system: Clear to auscultation. Respiratory effort normal. Cardiovascular system:  RRR.  Gastrointestinal system: Abdomen is nondistended, soft and nontender.  Normal bowel sounds heard. Central nervous system: Alert , confused ,  oriented to person only , right sided hemiparesis . Extremities:  no edema Skin: No rashes, lesions or ulcers Psychiatry: Confused ,no agitation.     Data Reviewed: I have personally reviewed following labs and imaging studies  CBC: Recent Labs  Lab 05/05/21 1008 05/08/21 0400  WBC 7.4 5.5  HGB 6.3* 6.2*  HCT 18.9* 18.1*  MCV 94.0 95.8  PLT 520* 423*    Basic Metabolic Panel: Recent Labs  Lab 05/05/21 1008 05/08/21 0400  NA 140 139  K 4.7 4.2  CL 109 114*  CO2 20* 19*  GLUCOSE 143* 170*  BUN 53* 44*  CREATININE 2.15* 2.43*  CALCIUM 8.5* 7.6*    GFR: Estimated Creatinine Clearance: 23.7 mL/min (A) (by C-G formula based on SCr of 2.43 mg/dL (H)).  Liver Function Tests: No results for input(s): AST, ALT, ALKPHOS, BILITOT, PROT, ALBUMIN in the last 168 hours.  CBG: Recent Labs  Lab 05/10/21 2204 05/11/21 0754 05/11/21 0807 05/11/21 1158 05/11/21 1607  GLUCAP 114* 118* 122* 152* 211*     Recent Results (from the past 240 hour(s))  Urine Culture     Status: Abnormal   Collection Time: 05/08/21  9:09 AM   Specimen: Urine, Suprapubic  Result Value Ref Range Status   Specimen Description URINE, SUPRAPUBIC  Final   Special Requests   Final    NONE Performed at [facility], [place] [address], [place], [place] [address]    Culture >=100,000 COLONIES/mL PSEUDOMONAS AERUGINOSA (A)  Final   Report Status 05/10/2021 FINAL  Final   Organism ID, Bacteria PSEUDOMONAS AERUGINOSA (A)  Final      Susceptibility   Pseudomonas aeruginosa - MIC*    CEFTAZIDIME 4 SENSITIVE Sensitive     CIPROFLOXACIN <=0.25 SENSITIVE Sensitive     GENTAMICIN 4 SENSITIVE Sensitive     IMIPENEM 2 SENSITIVE Sensitive     CEFEPIME 4 SENSITIVE Sensitive     * >=100,000 COLONIES/mL PSEUDOMONAS AERUGINOSA         Radiology Studies: No results found.      Scheduled Meds:  amLODipine  10 mg Oral Q1500   vitamin C  500 mg Oral Daily   aspirin  81 mg Oral Daily   carvedilol   12.5 mg Oral QHS   carvedilol  9.375 mg Oral Daily   clopidogrel  75 mg Oral Daily   darbepoetin (ARANESP) injection - NON-DIALYSIS  150 mcg Subcutaneous Q  Wed-1800   doxazosin  4 mg Oral Daily   ezetimibe  10 mg Oral Daily   fluticasone  2 spray Each Nare Daily   folic acid  1 mg Oral Daily   guaiFENesin  600 mg Oral BID   heparin  5,000 Units Subcutaneous Q8H   hydrALAZINE  50 mg Oral Q12H   insulin aspart  0-20 Units Subcutaneous TID WC   insulin aspart  0-5 Units Subcutaneous QHS   isosorbide mononitrate  60 mg Oral BID   pantoprazole  40 mg Oral Daily   polyethylene glycol  17 g Oral Daily   saccharomyces boulardii  250 mg Oral BID   senna-docusate  1 tablet Oral BID   sodium bicarbonate  650 mg Oral BID   sodium chloride flush  10 mL Intracatheter Q12H   sodium chloride flush  3 mL Intravenous Q12H   sodium chloride flush  3 mL Intravenous Q12H   vitamin B-12  1,000 mcg Oral Daily   zinc sulfate  220 mg Oral Daily   Continuous Infusions:  sodium chloride     cefTAZidime (FORTAZ)  IV 1 g (05/11/21 1327)     LOS: 12 days   Time spent: 33mins Greater than 50% of this time was spent in counseling, explanation of diagnosis, planning of further management, and coordination of care.   Voice Recognition Viviann Spare dictation system was used to create this note, attempts have been made to correct errors. Please contact the author with questions and/or clarifications.   Florencia Reasons, MD PhD FACP [facility]  Available via Epic secure chat 7am-7pm for nonurgent issues Please page for urgent issues To page the attending provider between 7A-7P or the covering provider during after hours 7P-7A, please log into the web site [email or web address] and access using universal [place] password for that web site. If you do not have the password, please call the hospital operator.    05/11/2021, 5:36 PM

## 2021-05-11 NOTE — Plan of Care (Signed)
  Problem: Education: Goal: Knowledge of General Education information will improve Description: Including pain rating scale, medication(s)/side effects and non-pharmacologic comfort measures Outcome: Progressing   Problem: Health Behavior/Discharge Planning: Goal: Ability to manage health-related needs will improve Outcome: Progressing   Problem: Clinical Measurements: Goal: Ability to maintain clinical measurements within normal limits will improve Outcome: Progressing Goal: Will remain free from infection Outcome: Progressing Goal: Diagnostic test results will improve Outcome: Progressing Goal: Respiratory complications will improve Outcome: Progressing Goal: Cardiovascular complication will be avoided Outcome: Progressing   Problem: Nutrition: Goal: Adequate nutrition will be maintained Outcome: Progressing   Problem: Pain Managment: Goal: General experience of comfort will improve Outcome: Progressing   Problem: Safety: Goal: Ability to remain free from injury will improve Outcome: Progressing   Problem: Skin Integrity: Goal: Risk for impaired skin integrity will decrease Outcome: Progressing   

## 2021-05-12 LAB — GLUCOSE, CAPILLARY
Glucose-Capillary: 141 mg/dL — ABNORMAL HIGH (ref 70–99)
Glucose-Capillary: 241 mg/dL — ABNORMAL HIGH (ref 70–99)
Glucose-Capillary: 139 mg/dL — ABNORMAL HIGH (ref 70–99)
Glucose-Capillary: 160 mg/dL — ABNORMAL HIGH (ref 70–99)

## 2021-05-12 NOTE — Progress Notes (Signed)
Inpatient Rehab Admissions Coordinator:  ° °Patient was screened for CIR candidacy by Dempsy Damiano, MS, CCC-SLP. At this time, Pt. Appears to be a a potential candidate for CIR. I will place  order for rehab consult per protocol for full assessment. Please contact me any with questions. ° °Dontasia Miranda, MS, CCC-SLP °Rehab Admissions Coordinator  °[phone] (celll) °[phone] (office) ° °

## 2021-05-12 NOTE — Progress Notes (Signed)
Inpatient Rehab Admissions Coordinator:   I spoke with Pt. And daughter and they would like to pursue CIR. I will follow for admission pending bed availability  Clemens Catholic, [place], [place] Admissions Coordinator  [phone] (celll) [phone] (office)

## 2021-05-12 NOTE — TOC Progression Note (Addendum)
Transition of Care [facility]) - Progression Note    Patient Details  Name: [name] MRN: [id number] Date of Birth: 04-Nov-1930  Transition of Care [facility]) CM/SW [place], [place] Phone Number: 05/12/2021, 9:53 AM  Clinical Narrative:   CSW following for SNF placement. Patient continues with no bed offers at this time. CSW has faxed out to pending SNFs again and extended bed search. CSW to follow.  UPDATE: CSW notified by CIR Admissions liaison that they will be able to admit patient pending medical stability, hopeful for tomorrow. CSW to follow for SNF if needed.    Expected Discharge Plan: Skilled Nursing Facility Barriers to Discharge: SNF Pending bed offer  Expected Discharge Plan and Services Expected Discharge Plan: [place] arrangements for the past 2 months: Single Family Home                                       Social Determinants of Health (SDOH) Interventions    Readmission Risk Interventions No flowsheet data found.

## 2021-05-12 NOTE — Progress Notes (Signed)
Physical Therapy Treatment Patient Details Name: [name] MRN: [id number] DOB: [birth year] Today's Date: 05/12/2021   History of Present Illness 87 y.o. male [facility] to [facility] hospital on 04/29/2021 with cough and weakness. Pt recently admitted at [facility] 10/29-11/4 with acute cholecystitis. Since discharge has been unable to ambulate due to weakness. Pt found to be COVID+ and anemic with Hgb of 6.0. Code stroke called 05/05/2021. Likely due to TIA but MRI did show tiny punctuate left frontal white matter infarct from small vessel disease. PMH includes AAA; OSA; CAD s/p CABG; CVA; chronic combined CHF; DM; HTN; indwelling foley; HLD; Jehovah's witness.    PT Comments    Pt is progressing slowly towards his physical therapy goals. He does demonstrate improved cognition and is now A&Ox3, although continues with deficits in attention, problem solving and awareness. Pt requiring mod-max assist for transfers to standing and ambulating 60 feet with a walker at a min assist level. Demonstrates poor standing balance, activity tolerance, generalized weakness, and decreased cognition. Pt/pt daughter still interested in post acute rehab, as pt daughter states she is at work for 4-8 hours/day. Will continue to progress mobility as tolerated.    Recommendations for follow up therapy are one component of a multi-disciplinary discharge planning process, led by the attending physician.  Recommendations may be updated based on patient status, additional functional criteria and insurance authorization.  Follow Up Recommendations  Skilled nursing-short term rehab (<3 hours/day)     Assistance Recommended at Discharge Frequent or constant Supervision/Assistance  Equipment Recommendations  None recommended by PT    Recommendations for Other Services       Precautions / Restrictions Precautions Precautions: Fall Restrictions Weight Bearing Restrictions: No     Mobility  Bed Mobility Overal bed mobility:  Needs Assistance Bed Mobility: Supine to Sit     Supine to sit: Min assist     General bed mobility comments: exiting towards left side of bed    Transfers Overall transfer level: Needs assistance Equipment used: Rolling walker (2 wheels) Transfers: Sit to/from Stand Sit to Stand: Mod assist;Max assist           General transfer comment: modA to power up to standing on first trial, cues for hand placement and "nose over toes." Pt requiring maxA on second trial after walk with fatigue    Ambulation/Gait Ambulation/Gait assistance: Min assist Gait Distance (Feet): 60 Feet Assistive device: Rolling walker (2 wheels) Gait Pattern/deviations: Step-through pattern;Decreased stride length;Trunk flexed Gait velocity: reduced Gait velocity interpretation: <1.8 ft/sec, indicate of risk for recurrent falls   General Gait Details: pt with right lateral lean, requiring minA for balance, cues for sequencing/direction and environmental negotiation. fatigues easily   Stairs             Wheelchair Mobility    Modified Rankin (Stroke Patients Only)       Balance Overall balance assessment: Needs assistance Sitting-balance support: No upper extremity supported;Feet supported Sitting balance-Leahy Scale: Fair     Standing balance support: Bilateral upper extremity supported Standing balance-Leahy Scale: Poor Standing balance comment: reliant on UE support of device                            Cognition Arousal/Alertness: Awake/alert Behavior During Therapy: WFL for tasks assessed/performed Overall Cognitive Status: Impaired/Different from baseline Area of Impairment: Attention;Memory;Following commands;Safety/judgement;Problem solving;Awareness                   Current  Attention Level: Sustained Memory: Decreased short-term memory;Decreased recall of precautions Following Commands: Follows one step commands consistently Safety/Judgement: Decreased  awareness of safety;Decreased awareness of deficits Awareness: Intellectual Problem Solving: Slow processing General Comments: Pt now A&Ox3, needs repetition to correctly respond to some questions, step by step cues for problem solving        Exercises General Exercises - Lower Extremity Long Arc Quad: Both;10 reps;Seated Hip Flexion/Marching: Both;10 reps;Seated    General Comments        Pertinent Vitals/Pain Pain Assessment: No/denies pain    Home Living                          Prior Function            PT Goals (current goals can now be found in the care plan section) Acute Rehab PT Goals Patient Stated Goal: to get strength back Potential to Achieve Goals: Fair Progress towards PT goals: Progressing toward goals    Frequency    Min 2X/week      PT Plan Current plan remains appropriate    Co-evaluation              AM-PAC PT "6 Clicks" Mobility   Outcome Measure  Help needed turning from your back to your side while in a flat bed without using bedrails?: A Little Help needed moving from lying on your back to sitting on the side of a flat bed without using bedrails?: A Little Help needed moving to and from a bed to a chair (including a wheelchair)?: A Little Help needed standing up from a chair using your arms (e.g., wheelchair or bedside chair)?: A Lot Help needed to walk in hospital room?: A Little Help needed climbing 3-5 steps with a railing? : Total 6 Click Score: 15    End of Session Equipment Utilized During Treatment: Gait belt Activity Tolerance: Patient tolerated treatment well Patient left: in chair;with call bell/phone within reach;with chair alarm set Nurse Communication: Mobility status PT Visit Diagnosis: Other abnormalities of gait and mobility (R26.89);Muscle weakness (generalized) (M62.81)     Time: 7628-3151 PT Time Calculation (min) (ACUTE ONLY): 31 min  Charges:  $Therapeutic Activity: 23-37 mins                      Wyona Almas, PT, DPT Acute Rehabilitation Services Pager [phone] Office [phone]    Deno Etienne 05/12/2021, 2:11 PM

## 2021-05-12 NOTE — Progress Notes (Signed)
PROGRESS NOTE    [name]  VHQ:[id number] DOB: 11-24-1930 DOA: 04/29/2021 PCP: Suzan Garibaldi, FNP  Chief Complaint  Patient presents with   Weakness     Brief Narrative:   [name] is a 87 y.o. male with medical history significant of AAA; OSA; CAD s/p CABG; CVA; chronic combined CHF; DM; HTN; indwelling foley; HLD; Jehovah's witness; and recent cholecystostomy tube placement presenting with cough and generalized weakness.  He was last hospitalized at [facility] from 10/29-11/4 with acute cholecystitis.  Since he is not a surgical candidate due to his generalized conditions as well as anemia with refusal of blood products, he was treated with Zosyn and perc drain placement.  Of note, he previously had choledocholithiasis with biliary sphincterotomy and balloon extraction in 07/2019.  He received IV iron infusion on 10/29 with increase of Hgb from 6.5 to 7.2.  For the last week, he has had cough and worsening fatigue/weakness.  He has been unable to ambulate and his daughter was unable to care for him.  The [facility] aide came today and recommended that the patient be brought in for evaluation. -His work-up in ED was significant for COVID-19 infection, with opacity on imaging, as well with anemia   hemoglobin of 5.8, patient is Jehovah's Witness and does not want any blood product transfusions. -Was started on steroids and remdesivir, no oxygen requirement, steroids were discontinued, he finished his remdesivir with no respiratory symptoms, awaiting SNF placement. -12/19 patient had code stroke called as he was noted to be encephalopathic with worsening right-sided weakness.  found to have small acute CVA.   medically stable to discharge, awaiting for SNF bed, vs CIR    Subjective: No complaints today, denies CP/SOB, reports overall feeling better except fatigue    Assessment & Plan:   Principal Problem:   COVID-19 Active Problems:   Postoperative atrial fibrillation (HCC)    Hyperlipidemia associated with type 2 diabetes mellitus (HCC)   CAD S/P percutaneous coronary angioplasty   Diabetes mellitus treated with oral medication (HCC)   AKI (acute kidney injury) ([place])   CKD (chronic kidney disease) stage 3, GFR 30-59 ml/min (HCC)   Suprapubic catheter (HCC)   Anemia of chronic disease   DNR (do not resuscitate)   Cerebral thrombosis with cerebral infarction   Pressure injury of skin   COVID-19 pneumonia -Treated with remdesivir and steroid -Currently no hypoxia  -out of isolation on 12/23   Acute CVA -12/19 AM patient with increased confusion and lethargy, worsening right-sided weakness, MRI brain significant for acute CVA  -Seen by neurology Dr. Leonie Man who recommend "MRI does show tiny punctate left frontal white matter infarct from small vessel disease.  He does have some right hand weakness which appears chronic from nerve injury in the past.  Recommend aspirin Plavix for 3 weeks and then Plavix alone from stroke standpoint.  May continue long-term dual antiplatelet therapy if necessary from cardiac standpoint.  And aggressive risk factor modification"   Symptomatic anemia, declined blood product due to Jehovah witness Received IV iron and Procrit Minimize blood draw No overt sign of external bleed   Percutaneous cholecystectomy drain malfunction -Was hospitalized at [facility] in October for cholecystitis, was deemed not a surgical candidate status post percutaneous cholecystectomy drain -Status post drain placement on December 20s by IR   type 2 diabetes, uncontrolled with hyperglycemia and hypoglycemia A1c 8.5 Home medication metformin and glipizide held Is started on insulin here , plan for better blood glucose control ,due to  acute stroke     Hypertension On multiple blood pressure medication including hydralazine/Imdur, Coreg, Norvasc, Cardura BP stable currently   CAD status post PCI 2019, CABG 2012 No chest pain Continue home meds    Hyperlipidemia; with history of statin allergy, continue zetia   A. Fib Rate controlled on Coreg High risk for anticoagulation in the setting of severe anemia, currently on aspirin/Plavix   Urinary retention with history of BXO Indwelling suprapubic catheter, catheter changed on 12/21     AKI renal ultrasound no hydronephrosis Urine was cloudy , appear improved after suprapubic catheter exchange  urine culture after suprapubic catheter + pseudomonas Start abx, plan for total of 7 days treatment, can transition to oral abx at discharge   Sinus congestion Start Flonase and Mucinex        Body mass index is 27.52 kg/m..     .       Skin Assessment:   I have examined the patients skin and I agree with the wound assessment as performed by the wound care RN as outlined below:   Pressure Injury 05/05/21 Buttocks Right Stage 2 -  Partial thickness loss of dermis presenting as a shallow open injury with a red, pink wound bed without slough. small open area on right buttocks (Active)  05/05/21 1700  Location: Buttocks  Location Orientation: Right  Staging: Stage 2 -  Partial thickness loss of dermis presenting as a shallow open injury with a red, pink wound bed without slough.  Wound Description (Comments): small open area on right buttocks  Present on Admission:       Unresulted Labs (From admission, onward)      None               DVT prophylaxis: heparin injection 5,000 Units Start: 05/06/21 2200 Place and maintain sequential compression device Start: 05/06/21 1438     Code Status: DNR Family Communication: none today Disposition:    Status is: Inpatient   Dispo: The patient is from: Home, lives alone, daughter stays with him currently              Anticipated d/c is to: SNF or CIR with palliative care              Anticipated d/c date is: medically stable to discharge, awaiting for SNF bed,                  Consultants:  Phone conversation with urology  Dr. Diona Fanti on 12/21 Neurology/stroke team IR   Procedures:  As above  Anti-infectives (From admission, onward)    Start     Dose/Rate Route Frequency Ordered Stop   05/09/21 1300  cefTAZidime (FORTAZ) 1 g in sodium chloride 0.9 % 100 mL IVPB        1 g 200 mL/hr over 30 Minutes Intravenous Every 24 hours 05/09/21 1144 05/16/21 1259   04/30/21 1000  remdesivir 100 mg in sodium chloride 0.9 % 100 mL IVPB       See Hyperspace for full Linked Orders Report.   100 mg 200 mL/hr over 30 Minutes Intravenous Daily 04/29/21 1640 05/03/21 0930   04/29/21 1645  remdesivir 200 mg in sodium chloride 0.9% 250 mL IVPB       See Hyperspace for full Linked Orders Report.   200 mg 580 mL/hr over 30 Minutes Intravenous Once 04/29/21 1640 04/29/21 1933       Objective: Vitals:   05/11/21 2027 05/12/21 0509 05/12/21 7829 05/12/21 1632  BP: (!) 132/54 (!) 142/54 (!) 136/53 131/62  Pulse: 67 67 66 86  Resp: 18 18 20 20   Temp: 97.7 F (36.5 C) (!) 97.5 F (36.4 C) 97.6 F (36.4 C) 97.6 F (36.4 C)  TempSrc: Oral Oral Oral   SpO2: 91% 94% 95% 97%  Weight:      Height:        Intake/Output Summary (Last 24 hours) at 05/12/2021 1653 Last data filed at 05/12/2021 1400 Gross per 24 hour  Intake 580 ml  Output 1090 ml  Net -510 ml   Filed Weights   04/30/21 2029 05/04/21 0506 05/09/21 0541  Weight: 94.9 kg 88.9 kg 94.6 kg    Examination:  General exam: Appears calm and comfortable, frail but NAD. Suprapubic catheter, GB drain Respiratory system: Clear to auscultation. Respiratory effort normal. Cardiovascular system: S1 & S2 heard, RRR.  Gastrointestinal system: Abdomen is nondistended, soft and nontender Central nervous system: Alert and oriented. No focal neurological deficits. Extremities: no edema Skin: No rashes, lesions or ulcers Psychiatry: Mood & affect appropriate.     Data Reviewed: I have personally reviewed following labs and imaging studies  CBC: Recent Labs   Lab 05/08/21 0400  WBC 5.5  HGB 6.2*  HCT 18.1*  MCV 95.8  PLT 270*   Basic Metabolic Panel: Recent Labs  Lab 05/08/21 0400  NA 139  K 4.2  CL 114*  CO2 19*  GLUCOSE 170*  BUN 44*  CREATININE 2.43*  CALCIUM 7.6*   GFR: Estimated Creatinine Clearance: 23.7 mL/min (A) (by C-G formula based on SCr of 2.43 mg/dL (H)). Liver Function Tests: No results for input(s): AST, ALT, ALKPHOS, BILITOT, PROT, ALBUMIN in the last 168 hours. No results for input(s): LIPASE, AMYLASE in the last 168 hours. No results for input(s): AMMONIA in the last 168 hours. Coagulation Profile: No results for input(s): INR, PROTIME in the last 168 hours. Cardiac Enzymes: No results for input(s): CKTOTAL, CKMB, CKMBINDEX, TROPONINI in the last 168 hours. BNP (last 3 results) No results for input(s): PROBNP in the last 8760 hours. HbA1C: No results for input(s): HGBA1C in the last 72 hours. CBG: Recent Labs  Lab 05/11/21 1607 05/11/21 2213 05/12/21 0644 05/12/21 1128 05/12/21 1626  GLUCAP 211* 151* 141* 241* 139*   Lipid Profile: No results for input(s): CHOL, HDL, LDLCALC, TRIG, CHOLHDL, LDLDIRECT in the last 72 hours. Thyroid Function Tests: No results for input(s): TSH, T4TOTAL, FREET4, T3FREE, THYROIDAB in the last 72 hours. Anemia Panel: No results for input(s): VITAMINB12, FOLATE, FERRITIN, TIBC, IRON, RETICCTPCT in the last 72 hours. Urine analysis:    Component Value Date/Time   COLORURINE YELLOW 05/08/2021 0909   APPEARANCEUR CLOUDY (A) 05/08/2021 0909   LABSPEC 1.014 05/08/2021 0909   PHURINE 5.0 05/08/2021 0909   GLUCOSEU NEGATIVE 05/08/2021 0909   HGBUR SMALL (A) 05/08/2021 0909   BILIRUBINUR NEGATIVE 05/08/2021 0909   KETONESUR 5 (A) 05/08/2021 0909   PROTEINUR 100 (A) 05/08/2021 0909   UROBILINOGEN 0.2 01/22/2015 1031   NITRITE NEGATIVE 05/08/2021 0909   LEUKOCYTESUR LARGE (A) 05/08/2021 0909   Sepsis Labs: @LABRCNTIP (procalcitonin:4,lacticidven:4)  ) Recent  Results (from the past 240 hour(s))  Urine Culture     Status: Abnormal   Collection Time: 05/08/21  9:09 AM   Specimen: Urine, Suprapubic  Result Value Ref Range Status   Specimen Description URINE, SUPRAPUBIC  Final   Special Requests   Final    NONE Performed at [facility], [address],  [place] [address]    Culture >=100,000 COLONIES/mL PSEUDOMONAS AERUGINOSA (A)  Final   Report Status 05/10/2021 FINAL  Final   Organism ID, Bacteria PSEUDOMONAS AERUGINOSA (A)  Final      Susceptibility   Pseudomonas aeruginosa - MIC*    CEFTAZIDIME 4 SENSITIVE Sensitive     CIPROFLOXACIN <=0.25 SENSITIVE Sensitive     GENTAMICIN 4 SENSITIVE Sensitive     IMIPENEM 2 SENSITIVE Sensitive     CEFEPIME 4 SENSITIVE Sensitive     * >=100,000 COLONIES/mL PSEUDOMONAS AERUGINOSA         Radiology Studies: No results found.      Scheduled Meds:  amLODipine  10 mg Oral Q1500   vitamin C  500 mg Oral Daily   aspirin  81 mg Oral Daily   carvedilol  12.5 mg Oral QHS   carvedilol  9.375 mg Oral Daily   clopidogrel  75 mg Oral Daily   darbepoetin (ARANESP) injection - NON-DIALYSIS  150 mcg Subcutaneous Q Wed-1800   doxazosin  4 mg Oral Daily   ezetimibe  10 mg Oral Daily   fluticasone  2 spray Each Nare Daily   folic acid  1 mg Oral Daily   guaiFENesin  600 mg Oral BID   heparin  5,000 Units Subcutaneous Q8H   hydrALAZINE  50 mg Oral Q12H   insulin aspart  0-20 Units Subcutaneous TID WC   insulin aspart  0-5 Units Subcutaneous QHS   isosorbide mononitrate  60 mg Oral BID   pantoprazole  40 mg Oral Daily   polyethylene glycol  17 g Oral Daily   saccharomyces boulardii  250 mg Oral BID   senna-docusate  1 tablet Oral BID   sodium bicarbonate  650 mg Oral BID   sodium chloride flush  10 mL Intracatheter Q12H   sodium chloride flush  3 mL Intravenous Q12H   vitamin B-12  1,000 mcg Oral Daily   zinc sulfate  220 mg Oral Daily   Continuous Infusions:  cefTAZidime  (FORTAZ)  IV 1 g (05/12/21 1202)     LOS: 13 days    Time spent: 35 min     Emeterio Reeve, MD [facility] Pager [phone]  If 7PM-7AM, please contact night-coverage [email or web address] Password [facility] 05/12/2021, 4:53 PM

## 2021-05-13 ENCOUNTER — Encounter (HOSPITAL_COMMUNITY): Payer: Self-pay | Admitting: Physical Medicine and Rehabilitation

## 2021-05-13 ENCOUNTER — Other Ambulatory Visit: Payer: Self-pay

## 2021-05-13 ENCOUNTER — Inpatient Hospital Stay (HOSPITAL_COMMUNITY)
Admission: RE | Admit: 2021-05-13 | Discharge: 2021-05-28 | DRG: 057 | Disposition: A | Discharge status: Home Health | Payer: Medicare Other | Source: Intra-hospital | Attending: Physical Medicine and Rehabilitation | Admitting: Physical Medicine and Rehabilitation

## 2021-05-13 DIAGNOSIS — E785 Hyperlipidemia, unspecified: Secondary | ICD-10-CM | POA: Diagnosis present

## 2021-05-13 DIAGNOSIS — Z8616 Personal history of COVID-19: Secondary | ICD-10-CM | POA: Diagnosis not present

## 2021-05-13 DIAGNOSIS — Z741 Need for assistance with personal care: Secondary | ICD-10-CM | POA: Diagnosis present

## 2021-05-13 DIAGNOSIS — Z23 Encounter for immunization: Secondary | ICD-10-CM

## 2021-05-13 DIAGNOSIS — I69351 Hemiplegia and hemiparesis following cerebral infarction affecting right dominant side: Secondary | ICD-10-CM | POA: Diagnosis present

## 2021-05-13 DIAGNOSIS — Z515 Encounter for palliative care: Secondary | ICD-10-CM | POA: Diagnosis not present

## 2021-05-13 DIAGNOSIS — N390 Urinary tract infection, site not specified: Secondary | ICD-10-CM | POA: Diagnosis present

## 2021-05-13 DIAGNOSIS — N1832 Chronic kidney disease, stage 3b: Secondary | ICD-10-CM | POA: Diagnosis present

## 2021-05-13 DIAGNOSIS — I714 Abdominal aortic aneurysm, without rupture, unspecified: Secondary | ICD-10-CM | POA: Diagnosis present

## 2021-05-13 DIAGNOSIS — Z79899 Other long term (current) drug therapy: Secondary | ICD-10-CM

## 2021-05-13 DIAGNOSIS — E1122 Type 2 diabetes mellitus with diabetic chronic kidney disease: Secondary | ICD-10-CM | POA: Diagnosis present

## 2021-05-13 DIAGNOSIS — R5383 Other fatigue: Secondary | ICD-10-CM | POA: Diagnosis present

## 2021-05-13 DIAGNOSIS — Z7189 Other specified counseling: Secondary | ICD-10-CM | POA: Diagnosis not present

## 2021-05-13 DIAGNOSIS — I6381 Other cerebral infarction due to occlusion or stenosis of small artery: Secondary | ICD-10-CM | POA: Diagnosis not present

## 2021-05-13 DIAGNOSIS — I2510 Atherosclerotic heart disease of native coronary artery without angina pectoris: Secondary | ICD-10-CM | POA: Diagnosis present

## 2021-05-13 DIAGNOSIS — I130 Hypertensive heart and chronic kidney disease with heart failure and stage 1 through stage 4 chronic kidney disease, or unspecified chronic kidney disease: Secondary | ICD-10-CM | POA: Diagnosis present

## 2021-05-13 DIAGNOSIS — R001 Bradycardia, unspecified: Secondary | ICD-10-CM | POA: Diagnosis present

## 2021-05-13 DIAGNOSIS — Z7982 Long term (current) use of aspirin: Secondary | ICD-10-CM

## 2021-05-13 DIAGNOSIS — K5900 Constipation, unspecified: Secondary | ICD-10-CM | POA: Diagnosis present

## 2021-05-13 DIAGNOSIS — E1142 Type 2 diabetes mellitus with diabetic polyneuropathy: Secondary | ICD-10-CM | POA: Diagnosis present

## 2021-05-13 DIAGNOSIS — D649 Anemia, unspecified: Secondary | ICD-10-CM | POA: Diagnosis present

## 2021-05-13 DIAGNOSIS — H9190 Unspecified hearing loss, unspecified ear: Secondary | ICD-10-CM | POA: Diagnosis present

## 2021-05-13 DIAGNOSIS — B965 Pseudomonas (aeruginosa) (mallei) (pseudomallei) as the cause of diseases classified elsewhere: Secondary | ICD-10-CM | POA: Diagnosis present

## 2021-05-13 DIAGNOSIS — L89311 Pressure ulcer of right buttock, stage 1: Secondary | ICD-10-CM | POA: Diagnosis present

## 2021-05-13 DIAGNOSIS — Z7984 Long term (current) use of oral hypoglycemic drugs: Secondary | ICD-10-CM | POA: Diagnosis not present

## 2021-05-13 DIAGNOSIS — I5042 Chronic combined systolic (congestive) and diastolic (congestive) heart failure: Secondary | ICD-10-CM | POA: Diagnosis present

## 2021-05-13 DIAGNOSIS — I69319 Unspecified symptoms and signs involving cognitive functions following cerebral infarction: Secondary | ICD-10-CM

## 2021-05-13 DIAGNOSIS — Z66 Do not resuscitate: Secondary | ICD-10-CM | POA: Diagnosis present

## 2021-05-13 DIAGNOSIS — Z8679 Personal history of other diseases of the circulatory system: Secondary | ICD-10-CM

## 2021-05-13 DIAGNOSIS — I252 Old myocardial infarction: Secondary | ICD-10-CM

## 2021-05-13 DIAGNOSIS — N183 Chronic kidney disease, stage 3 unspecified: Secondary | ICD-10-CM | POA: Diagnosis present

## 2021-05-13 DIAGNOSIS — Z9359 Other cystostomy status: Secondary | ICD-10-CM

## 2021-05-13 DIAGNOSIS — Z87891 Personal history of nicotine dependence: Secondary | ICD-10-CM

## 2021-05-13 DIAGNOSIS — Z951 Presence of aortocoronary bypass graft: Secondary | ICD-10-CM | POA: Diagnosis not present

## 2021-05-13 DIAGNOSIS — M1711 Unilateral primary osteoarthritis, right knee: Secondary | ICD-10-CM | POA: Diagnosis present

## 2021-05-13 DIAGNOSIS — G4733 Obstructive sleep apnea (adult) (pediatric): Secondary | ICD-10-CM | POA: Diagnosis present

## 2021-05-13 LAB — CBC
HCT: 19.2 % — ABNORMAL LOW (ref 39.0–52.0)
Hemoglobin: 6.1 g/dL — CL (ref 13.0–17.0)
MCH: 30.7 pg (ref 26.0–34.0)
MCHC: 31.8 g/dL (ref 30.0–36.0)
MCV: 96.5 fL (ref 80.0–100.0)
Platelets: 272 10*3/uL (ref 150–400)
RBC: 1.99 MIL/uL — ABNORMAL LOW (ref 4.22–5.81)
RDW: 22.3 % — ABNORMAL HIGH (ref 11.5–15.5)
WBC: 3.3 10*3/uL — ABNORMAL LOW (ref 4.0–10.5)
nRBC: 4.5 % — ABNORMAL HIGH (ref 0.0–0.2)

## 2021-05-13 LAB — GLUCOSE, CAPILLARY
Glucose-Capillary: 165 mg/dL — ABNORMAL HIGH (ref 70–99)
Glucose-Capillary: 281 mg/dL — ABNORMAL HIGH (ref 70–99)
Glucose-Capillary: 295 mg/dL — ABNORMAL HIGH (ref 70–99)
Glucose-Capillary: 75 mg/dL (ref 70–99)
Glucose-Capillary: 102 mg/dL — ABNORMAL HIGH (ref 70–99)
Glucose-Capillary: 66 mg/dL — ABNORMAL LOW (ref 70–99)
Glucose-Capillary: 134 mg/dL — ABNORMAL HIGH (ref 70–99)
Glucose-Capillary: 182 mg/dL — ABNORMAL HIGH (ref 70–99)
Glucose-Capillary: 196 mg/dL — ABNORMAL HIGH (ref 70–99)

## 2021-05-13 LAB — CREATININE, SERUM
Creatinine, Ser: 1.91 mg/dL — ABNORMAL HIGH (ref 0.61–1.24)
GFR, Estimated: 33 mL/min — ABNORMAL LOW (ref >60)

## 2021-05-13 MED ORDER — CARVEDILOL 6.25 MG PO TABS
9.3750 mg | ORAL_TABLET | Freq: Every day | ORAL | Status: DC
  Administered 2021-05-14: 09:00:00 9.375 mg via ORAL
  Filled 2021-05-13: qty 1

## 2021-05-13 MED ORDER — SODIUM CHLORIDE 0.9 % IV SOLN
1.0000 g | INTRAVENOUS | Status: AC
  Administered 2021-05-14 – 2021-05-16 (×3): 1 g via INTRAVENOUS
  Filled 2021-05-13 (×5): qty 1

## 2021-05-13 MED ORDER — BISACODYL 5 MG PO TBEC
5.0000 mg | DELAYED_RELEASE_TABLET | Freq: Every day | ORAL | Status: DC | PRN
  Filled 2021-05-13: qty 1

## 2021-05-13 MED ORDER — ONDANSETRON HCL 4 MG PO TABS: 4.0000 mg | ORAL_TABLET | Freq: Four times a day (QID) | ORAL | Status: DC | PRN

## 2021-05-13 MED ORDER — ISOSORBIDE MONONITRATE ER 30 MG PO TB24
60.0000 mg | ORAL_TABLET | Freq: Two times a day (BID) | ORAL | Status: DC
  Administered 2021-05-13 – 2021-05-28 (×30): 60 mg via ORAL
  Filled 2021-05-13 (×31): qty 2

## 2021-05-13 MED ORDER — ONDANSETRON HCL 4 MG/2ML IJ SOLN: 4.0000 mg | Freq: Four times a day (QID) | INTRAMUSCULAR | Status: DC | PRN

## 2021-05-13 MED ORDER — ALBUTEROL SULFATE HFA 108 (90 BASE) MCG/ACT IN AERS
2.0000 | INHALATION_SPRAY | RESPIRATORY_TRACT | Status: DC | PRN
  Filled 2021-05-13: qty 6.7

## 2021-05-13 MED ORDER — HYDRALAZINE HCL 50 MG PO TABS
50.0000 mg | ORAL_TABLET | Freq: Two times a day (BID) | ORAL | Status: DC
  Administered 2021-05-13 – 2021-05-14 (×2): 50 mg via ORAL
  Filled 2021-05-13 (×2): qty 1

## 2021-05-13 MED ORDER — ASPIRIN 81 MG PO CHEW
81.0000 mg | CHEWABLE_TABLET | Freq: Every day | ORAL | Status: AC
  Administered 2021-05-14 – 2021-05-28 (×15): 81 mg via ORAL
  Filled 2021-05-13 (×15): qty 1

## 2021-05-13 MED ORDER — EZETIMIBE 10 MG PO TABS
10.0000 mg | ORAL_TABLET | Freq: Every day | ORAL | Status: DC
  Administered 2021-05-14 – 2021-05-28 (×15): 10 mg via ORAL
  Filled 2021-05-13 (×15): qty 1

## 2021-05-13 MED ORDER — GUAIFENESIN ER 600 MG PO TB12
600.0000 mg | ORAL_TABLET | Freq: Two times a day (BID) | ORAL | Status: DC
  Administered 2021-05-13 – 2021-05-28 (×30): 600 mg via ORAL
  Filled 2021-05-13 (×30): qty 1

## 2021-05-13 MED ORDER — HEPARIN SODIUM (PORCINE) 5000 UNIT/ML IJ SOLN: 5000.0000 [IU] | Freq: Three times a day (TID) | INTRAMUSCULAR | Status: DC

## 2021-05-13 MED ORDER — ASCORBIC ACID 500 MG PO TABS
500.0000 mg | ORAL_TABLET | Freq: Every day | ORAL | Status: DC
  Administered 2021-05-14 – 2021-05-28 (×15): 500 mg via ORAL
  Filled 2021-05-13 (×15): qty 1

## 2021-05-13 MED ORDER — DARBEPOETIN ALFA 150 MCG/0.3ML IJ SOSY
150.0000 ug | PREFILLED_SYRINGE | INTRAMUSCULAR | Status: AC
  Administered 2021-05-14 – 2021-05-21 (×2): 150 ug via SUBCUTANEOUS
  Filled 2021-05-13 (×3): qty 0.3

## 2021-05-13 MED ORDER — INSULIN ASPART 100 UNIT/ML IJ SOLN
0.0000 [IU] | Freq: Three times a day (TID) | INTRAMUSCULAR | Status: DC
Start: 2021-05-13 — End: 2021-05-22
  Administered 2021-05-14: 13:00:00 11 [IU] via SUBCUTANEOUS
  Administered 2021-05-14: 09:00:00 4 [IU] via SUBCUTANEOUS
  Administered 2021-05-15: 09:00:00 3 [IU] via SUBCUTANEOUS
  Administered 2021-05-15: 12:00:00 11 [IU] via SUBCUTANEOUS
  Administered 2021-05-15 – 2021-05-16 (×2): 4 [IU] via SUBCUTANEOUS
  Administered 2021-05-16: 13:00:00 7 [IU] via SUBCUTANEOUS
  Administered 2021-05-16: 18:00:00 4 [IU] via SUBCUTANEOUS
  Administered 2021-05-17: 7 [IU] via SUBCUTANEOUS
  Administered 2021-05-17: 3 [IU] via SUBCUTANEOUS
  Administered 2021-05-17 – 2021-05-18 (×3): 4 [IU] via SUBCUTANEOUS
  Administered 2021-05-18: 3 [IU] via SUBCUTANEOUS
  Administered 2021-05-19: 4 [IU] via SUBCUTANEOUS
  Administered 2021-05-19 (×2): 7 [IU] via SUBCUTANEOUS
  Administered 2021-05-20: 3 [IU] via SUBCUTANEOUS
  Administered 2021-05-20: 11 [IU] via SUBCUTANEOUS
  Administered 2021-05-21: 4 [IU] via SUBCUTANEOUS
  Administered 2021-05-21: 7 [IU] via SUBCUTANEOUS

## 2021-05-13 MED ORDER — FLUTICASONE PROPIONATE 50 MCG/ACT NA SUSP
2.0000 | Freq: Every day | NASAL | Status: DC
  Administered 2021-05-14 – 2021-05-28 (×15): 2 via NASAL
  Filled 2021-05-13: qty 16

## 2021-05-13 MED ORDER — POLYETHYLENE GLYCOL 3350 17 G PO PACK
17.0000 g | PACK | Freq: Every day | ORAL | Status: DC
  Administered 2021-05-14 – 2021-05-28 (×15): 17 g via ORAL
  Filled 2021-05-13 (×15): qty 1

## 2021-05-13 MED ORDER — ACETAMINOPHEN 325 MG PO TABS: 650.0000 mg | ORAL_TABLET | Freq: Four times a day (QID) | ORAL | Status: DC | PRN

## 2021-05-13 MED ORDER — CLOPIDOGREL BISULFATE 75 MG PO TABS
75.0000 mg | ORAL_TABLET | Freq: Every day | ORAL | Status: DC
  Administered 2021-05-14 – 2021-05-28 (×15): 75 mg via ORAL
  Filled 2021-05-13 (×15): qty 1

## 2021-05-13 MED ORDER — SENNOSIDES-DOCUSATE SODIUM 8.6-50 MG PO TABS
1.0000 | ORAL_TABLET | Freq: Two times a day (BID) | ORAL | Status: DC
  Administered 2021-05-13 – 2021-05-28 (×30): 1 via ORAL
  Filled 2021-05-13 (×30): qty 1

## 2021-05-13 MED ORDER — SODIUM BICARBONATE 650 MG PO TABS
650.0000 mg | ORAL_TABLET | Freq: Two times a day (BID) | ORAL | Status: DC
  Administered 2021-05-13 – 2021-05-28 (×30): 650 mg via ORAL
  Filled 2021-05-13 (×30): qty 1

## 2021-05-13 MED ORDER — SACCHAROMYCES BOULARDII 250 MG PO CAPS
250.0000 mg | ORAL_CAPSULE | Freq: Two times a day (BID) | ORAL | Status: DC
  Administered 2021-05-13 – 2021-05-28 (×30): 250 mg via ORAL
  Filled 2021-05-13 (×30): qty 1

## 2021-05-13 MED ORDER — VITAMIN B-12 1000 MCG PO TABS
1000.0000 ug | ORAL_TABLET | Freq: Every day | ORAL | Status: DC
  Administered 2021-05-14 – 2021-05-28 (×15): 1000 ug via ORAL
  Filled 2021-05-13 (×15): qty 1

## 2021-05-13 MED ORDER — HEPARIN SODIUM (PORCINE) 5000 UNIT/ML IJ SOLN
5000.0000 [IU] | Freq: Three times a day (TID) | INTRAMUSCULAR | Status: DC
  Administered 2021-05-13 – 2021-05-22 (×26): 5000 [IU] via SUBCUTANEOUS
  Filled 2021-05-13 (×26): qty 1

## 2021-05-13 MED ORDER — AMLODIPINE BESYLATE 10 MG PO TABS
10.0000 mg | ORAL_TABLET | Freq: Every day | ORAL | Status: DC
  Administered 2021-05-14 – 2021-05-27 (×14): 10 mg via ORAL
  Filled 2021-05-13 (×15): qty 1

## 2021-05-13 MED ORDER — DOXAZOSIN MESYLATE 4 MG PO TABS
4.0000 mg | ORAL_TABLET | Freq: Every day | ORAL | Status: DC
  Administered 2021-05-14 – 2021-05-28 (×15): 4 mg via ORAL
  Filled 2021-05-13 (×15): qty 1

## 2021-05-13 MED ORDER — GUAIFENESIN-DM 100-10 MG/5ML PO SYRP: 10.0000 mL | ORAL_SOLUTION | ORAL | Status: DC | PRN

## 2021-05-13 MED ORDER — CARVEDILOL 12.5 MG PO TABS
12.5000 mg | ORAL_TABLET | Freq: Every day | ORAL | Status: DC
  Administered 2021-05-13: 22:00:00 12.5 mg via ORAL
  Filled 2021-05-13: qty 1

## 2021-05-13 MED ORDER — SORBITOL 70 % SOLN
30.0000 mL | Freq: Once | Status: AC
  Administered 2021-05-13: 19:00:00 30 mL via ORAL
  Filled 2021-05-13: qty 30

## 2021-05-13 MED ORDER — HYDROCOD POLST-CPM POLST ER 10-8 MG/5ML PO SUER: 5.0000 mL | Freq: Two times a day (BID) | ORAL | Status: DC | PRN

## 2021-05-13 MED ORDER — PANTOPRAZOLE SODIUM 40 MG PO TBEC
40.0000 mg | DELAYED_RELEASE_TABLET | Freq: Every day | ORAL | Status: DC
  Administered 2021-05-14 – 2021-05-28 (×15): 40 mg via ORAL
  Filled 2021-05-13 (×15): qty 1

## 2021-05-13 MED ORDER — FOLIC ACID 1 MG PO TABS
1.0000 mg | ORAL_TABLET | Freq: Every day | ORAL | Status: DC
  Administered 2021-05-14 – 2021-05-28 (×15): 1 mg via ORAL
  Filled 2021-05-13 (×16): qty 1

## 2021-05-13 MED ORDER — ZINC SULFATE 220 (50 ZN) MG PO CAPS
220.0000 mg | ORAL_CAPSULE | Freq: Every day | ORAL | Status: DC
  Administered 2021-05-14 – 2021-05-28 (×15): 220 mg via ORAL
  Filled 2021-05-13 (×15): qty 1

## 2021-05-13 NOTE — Progress Notes (Signed)
Inpatient Rehab Admissions Coordinator:   I have a bed for this Pt. On CIR. Rn may call report to [phone] after 12pm.  Clemens Catholic, [place], [place] Admissions Coordinator  [phone] ([place]) [phone] (office)

## 2021-05-13 NOTE — Progress Notes (Signed)
Patient admitted to the unit today. Alert and oriented, able to follow commands. Patient oriented to unit and room. Assessments complete. Patient in lowest position, call bell within reach.  Yehuda Mao, LPN

## 2021-05-13 NOTE — Progress Notes (Signed)
Hypoglycemic Event  CBG: 66  Treatment: 8 oz juice/soda  Symptoms: None  Follow-up CBG: Time:1811, 1837, 1909   CBG Result: 66, 102, 134, 182  Possible Reasons for Event: Unknown      [name]

## 2021-05-13 NOTE — TOC Transition Note (Signed)
Transition of Care [facility]) - CM/SW Discharge Note   Patient Details  Name: [name] MRN: [id number] Date of Birth: [birth year]  [facility]) CM/SW Contact:  Tom-Johnson, Renea Ee, RN Phone Number: 05/13/2021, 1:17 PM   Clinical Narrative:    Patient is scheduled for discharge today and will transition to CIR. Denies any TOC needs at this time. No further TOC needs noted.   Final next level of care: IP Rehab Facility Barriers to Discharge: Barriers Resolved   Patient Goals and CMS Choice Patient states their goals for this hospitalization and ongoing recovery are:: To return home after rehab CMS Medicare.gov Compare Post Acute Care list provided to:: Patient Choice offered to / list presented to : Patient  Discharge Placement                       Discharge Plan and Services                DME Arranged: N/A DME Agency: NA                  Social Determinants of Health (SDOH) Interventions     Readmission Risk Interventions No flowsheet data found.

## 2021-05-13 NOTE — Progress Notes (Addendum)
MR Admission Coordinator Pre-Admission Assessment   Patient: [name] is an 87 y.o., male MRN: [id number] DOB: [birth year] Height: $RemoveBeforeDE'6\' 1"'CcsvjnRnaKvbiZK$  (185.4 cm) Weight: 94.6 kg   Insurance Information HMO:     PPO:      PCP:      IPA:      80/20: yes     OTHER:  PRIMARY:Medicare  A and B     Policy#:  [id number]     Subscriber: Pt. Phone#: Verified online    Fax#:  Pre-Cert#:       Employer:  Benefits:  Phone #:      Name:  Eff. Date: Parts A ad B effective  01/16/98 Deduct: $1556      Out of Pocket Max:  None      Life Max: N/A  CIR: 100%      SNF: 100 days Outpatient: 80%     Co-Pay: 20% Home Health: 100%      Co-Pay: none DME: 80%     Co-Pay: 20% Providers: patient's choice SECONDARY: A[employer]       Policy#: [id number]     Phone#:    Financial Counselor:       Phone#:    The Data Collection Information Summary for patients in Inpatient Rehabilitation Facilities with attached Privacy Act [place] Records was provided and verbally reviewed with: Patient and Family   Emergency Contact Information Contact Information       Name Relation Home Work Mobile    [name],[name] Daughter [phone]   [phone]    Damiano, Stamper [phone]               Current Medical History  Patient Admitting Diagnosis: CVA. COVID, anemia History of Present Illness: [name]  is a 87 year old right-handed male with history of AAA, OSA, chronic anemia.  CAD with CABG, CVA, chronic combined CHF, diabetes mellitus, hypertension, hyperlipidemia and recent cholecystostomy tube placement.  Last hospitalized [facility] from 10/29-2011/4 for acute cholecystitis.  He did receive IV iron infusion 10/29 with increase hemoglobin of 6.5-7.2.  Patient was not a surgical candidate due to generalized condition as well as anemia refusal of blood products patient is Jehovah's Witness.  He was treated with Zosyn and percutaneous drain was placed.  Presented 04/29/2021 with increasing cough/fatigue and  generalized weakness.  CT of the abdomen pelvis showed inflammatory changes in gallbladder consistent with cholecystitis.  Indwelling cholecystostomy tube noted.  Colonic diverticulosis without diverticulitis.  Admission chemistries unremarkable except sodium 129 glucose 229,HGb 5.8 urine culture greater 100,000 Pseudomonas, COVID-19 .  Patient was placed on broad-spectrum antibiotics changed to [place].  He was started on low-dose steroids as well as remdesivir completing course.  He completed his course of isolation 12/23.  On 05/05/2021 with increasing confusion.  MRI of the brain showed 3 mm acute infarct within the posterior left frontal lobe white matter.  Known chronic cortical subcortical infarcts within the left frontal and parietal lobes.  Additional known small chronic infarcts within the left occipital lobe posterior left temporal and posterior right frontal lobe.  MRA showed no evidence of intracranial mass.  No midline shift.  Echocardiogram ejection fraction of 45 to 50%.  Left ventricle demonstrated some global hypokinesis.   He continued a chronic indwelling suprapubic catheter last changed 12/21.  Maintained on aspirin and Plavix for CVA prophylaxis x3 weeks then Plavix alone.  He is tolerating a regular consistency diet.  Therapy evaluations completed due to patient decreased functional mobility was admitted for a comprehensive  rehab program. Complete NIHSS TOTAL: 3   Patient's medical record from [facility]  has been reviewed by the rehabilitation admission coordinator and physician.   Past Medical History         Past Medical History:  Diagnosis Date   Abdominal aortic aneurysm      a. Aortic duplex 06/2014: mild aneurysmal dilatation of proximal abdominal aorta measuring 3.4x3.4cm. No sig change from 2012. 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 Patent LIMA to LAD. Acutely occluded SVG to PDA within previously placed stent. 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 (03/26/2016)   Stenosis of left carotid artery     Suprapubic catheter ([place])     Vitamin D deficiency 05/27/2018      Has the patient had major surgery during 100 days prior to admission? No   Family History   family history includes Heart Problems (age of onset: 4) in his father.   Current Medications   Current Facility-Administered Medications:    acetaminophen (TYLENOL) tablet 650 mg, 650 mg, Oral, Q6H PRN, Karmen Bongo, MD, 650 mg at 04/30/21 1037   albuterol (VENTOLIN HFA) 108 (90 Base) MCG/ACT inhaler 2 puff, 2 puff, Inhalation, Q2H PRN, Karmen Bongo, MD   amLODipine (NORVASC) tablet 10 mg, 10 mg, Oral, Q1500, Karmen Bongo, MD, 10 mg at 05/12/21 1409   ascorbic acid (VITAMIN  C) tablet 500 mg, 500 mg, Oral, Daily, Karmen Bongo, MD, 500 mg at 05/13/21 2353   aspirin chewable tablet 81 mg, 81 mg, Oral, Daily, Reome, Earle J, RPH, 81 mg at 05/13/21 0911   bisacodyl (DULCOLAX) EC tablet 5 mg, 5 mg, Oral, Daily PRN, Karmen Bongo, MD   carvedilol (COREG) tablet 12.5 mg, 12.5 mg, Oral, QHS, Heloise Purpura, RPH, 12.5 mg at 05/11/21 2042   carvedilol (COREG) tablet 9.375 mg, 9.375 mg, Oral, Daily, Heloise Purpura, RPH, 9.375 mg at 05/13/21 0910   cefTAZidime (FORTAZ) 1 g in sodium chloride 0.9 % 100 mL IVPB, 1 g, Intravenous, Q24H, Florencia Reasons, MD, Last Rate: 200 mL/hr at 05/12/21 1202, 1 g at 05/12/21 1202   chlorpheniramine-HYDROcodone (TUSSIONEX) 10-8 MG/5ML suspension 5 mL, 5 mL, Oral, Q12H PRN,  Karmen Bongo, MD, 5 mL at 05/11/21 2041   clopidogrel (PLAVIX) tablet 75 mg, 75 mg, Oral, Daily, Elgergawy, Silver Huguenin, MD, 75 mg at 05/13/21 5956   Darbepoetin Alfa (ARANESP) injection 150 mcg, 150 mcg, Subcutaneous, Q Wed-1800, Elgergawy, Silver Huguenin, MD, 150 mcg at 05/07/21 1724   doxazosin (CARDURA) tablet 4 mg, 4 mg, Oral, Daily, Karmen Bongo, MD, 4 mg at 05/13/21 3875   ezetimibe (ZETIA) tablet 10 mg, 10 mg, Oral, Daily, Karmen Bongo, MD, 10 mg at 05/13/21 0910   fluticasone (FLONASE) 50 MCG/ACT nasal spray 2 spray, 2 spray, Each Nare, Daily, Florencia Reasons, MD, 2 spray at 64/33/29 5188   folic acid (FOLVITE) tablet 1 mg, 1 mg, Oral, Daily, Florencia Reasons, MD, 1 mg at 05/13/21 0910   guaiFENesin (MUCINEX) 12 hr tablet 600 mg, 600 mg, Oral, BID, Florencia Reasons, MD, 600 mg at 05/13/21 0911   guaiFENesin-dextromethorphan (ROBITUSSIN DM) 100-10 MG/5ML syrup 10 mL, 10 mL, Oral, Q4H PRN, Karmen Bongo, MD, 10 mL at 05/08/21 2131   heparin injection 5,000 Units, 5,000 Units, Subcutaneous, Q8H, Allred, Darrell K, PA-C, 5,000 Units at 05/12/21 2238   hydrALAZINE (APRESOLINE) tablet 50 mg, 50 mg, Oral, Q12H, Karmen Bongo, MD, 50 mg at 05/13/21 0910   insulin aspart (novoLOG) injection  0-20 Units, 0-20 Units, Subcutaneous, TID WC, Elgergawy, Silver Huguenin, MD, 4 Units at 05/13/21 0801   insulin aspart (novoLOG) injection 0-5 Units, 0-5 Units, Subcutaneous, QHS, Elgergawy, Silver Huguenin, MD, 2 Units at 05/09/21 2130   isosorbide mononitrate (IMDUR) 24 hr tablet 60 mg, 60 mg, Oral, BID, Karmen Bongo, MD, 60 mg at 05/13/21 0911   lidocaine (PF) (XYLOCAINE) 1 % injection, , , PRN, Mugweru, Jon, MD, 10 mL at 05/06/21 1656   ondansetron (ZOFRAN) tablet 4 mg, 4 mg, Oral, Q6H PRN **OR** ondansetron (ZOFRAN) injection 4 mg, 4 mg, Intravenous, Q6H PRN, Karmen Bongo, MD   pantoprazole (PROTONIX) EC tablet 40 mg, 40 mg, Oral, Daily, Elgergawy, Silver Huguenin, MD, 40 mg at 05/13/21 0911   polyethylene glycol (MIRALAX / GLYCOLAX) packet 17 g, 17 g, Oral, Daily, Florencia Reasons, MD, 17 g at 05/13/21 4166   saccharomyces boulardii (FLORASTOR) capsule 250 mg, 250 mg, Oral, BID, Florencia Reasons, MD, 250 mg at 05/13/21 0630   senna-docusate (Senokot-S) tablet 1 tablet, 1 tablet, Oral, BID, Florencia Reasons, MD, 1 tablet at 05/13/21 0910   sodium bicarbonate tablet 650 mg, 650 mg, Oral, BID, Florencia Reasons, MD, 650 mg at 05/13/21 0910   sodium chloride flush (NS) 0.9 % injection 10 mL, 10 mL, Intracatheter, Q12H, Mugweru, Jon, MD, 10 mL at 05/13/21 0913   sodium chloride flush (NS) 0.9 % injection 3 mL, 3 mL, Intravenous, Q12H, Karmen Bongo, MD, 3 mL at 05/13/21 0914   vitamin B-12 (CYANOCOBALAMIN) tablet 1,000 mcg, 1,000 mcg, Oral, Daily, Florencia Reasons, MD, 1,000 mcg at 05/13/21 1601   zinc sulfate capsule 220 mg, 220 mg, Oral, Daily, Karmen Bongo, MD, 220 mg at 05/13/21 0932   Patients Current Diet:  Diet Order                  Diet Heart Room service appropriate? Yes; Fluid consistency: Thin  Diet effective now                         Precautions / Restrictions Precautions Precautions: Fall Precaution Comments: Heparin (pt bleeding from injection site) Restrictions Weight Bearing Restrictions: No    Has the patient  had 2  or more falls or a fall with injury in the past year? Yes   Prior Activity Level Limited Community (1-2x/wk): Pt. active in the community PTA   Prior Functional Level Self Care: Did the patient need help bathing, dressing, using the toilet or eating? Independent   Indoor Mobility: Did the patient need assistance with walking from room to room (with or without device)? Independent   Stairs: Did the patient need assistance with internal or external stairs (with or without device)? Needed some help   Functional Cognition: Did the patient need help planning regular tasks such as shopping or remembering to take medications? Needed some help   Patient Information Are you of Hispanic, Latino/a,or Spanish origin?: A. No, not of Hispanic, Latino/a, or Spanish origin What is your race?: A. White Do you need or want an interpreter to communicate with a doctor or health care staff?: 0. No   Patient's Response To:  Health Literacy and Transportation Is the patient able to respond to health literacy and transportation needs?: Yes Health Literacy - How often do you need to have someone help you when you read instructions, pamphlets, or other written material from your doctor or pharmacy?: Sometimes In the past 12 months, has lack of transportation kept you from medical appointments or from getting medications?: No In the past 12 months, has lack of transportation kept you from meetings, work, or from getting things needed for daily living?: No   Home Assistive Devices / [place] Devices/Equipment: Environmental consultant (specify type) Home Equipment: Wheelchair - power, Wheelchair - manual, Conservation officer, nature (2 wheels), Rollator (4 wheels), BSC/3in1, Shower seat   Prior Device Use: Indicate devices/aids used by the patient prior to current illness, exacerbation or injury? Walker   Current Functional Level Cognition   Overall Cognitive Status: Impaired/Different from baseline Current Attention  Level: Sustained Orientation Level: Oriented to person, Oriented to place Following Commands: Follows one step commands consistently Safety/Judgement: Decreased awareness of safety, Decreased awareness of deficits General Comments: Pt now A&Ox3, needs repetition to correctly respond to some questions, step by step cues for problem solving    Extremity Assessment (includes Sensation/Coordination)   Upper Extremity Assessment: RUE deficits/detail RUE Coordination: decreased fine motor, decreased gross motor  Lower Extremity Assessment: Defer to PT evaluation     ADLs   Overall ADL's : Needs assistance/impaired Eating/Feeding: Supervision/ safety, Independent Grooming: Wash/dry hands, Wash/dry face, Oral care, Sitting, Minimal assistance Grooming Details (indicate cue type and reason): performed seated on EOB with assistance needed to load toothbrush Upper Body Bathing: Min guard, Sitting Lower Body Bathing: Maximal assistance Upper Body Dressing : Min guard, Sitting Lower Body Dressing: Maximal assistance Toilet Transfer: Minimal assistance, Ambulation, Rolling walker (2 wheels), BSC/3in1, Cueing for safety Toileting- Clothing Manipulation and Hygiene: Moderate assistance, Sit to/from stand Functional mobility during ADLs: Minimal assistance, Cueing for safety, Rolling walker (2 wheels) General ADL Comments: patient had difficulty opening toothpaste and applying to brush.  Used RUE as gross assist     Mobility   Overal bed mobility: Needs Assistance Bed Mobility: Supine to Sit Rolling: Min assist Supine to sit: Min assist General bed mobility comments: exiting towards left side of bed     Transfers   Overall transfer level: Needs assistance Equipment used: Rolling walker (2 wheels) Transfers: Sit to/from Stand Sit to Stand: Mod assist, Max assist Bed to/from chair/wheelchair/BSC transfer type:: Step pivot Step pivot transfers: Min assist General transfer comment: modA to power  up to standing on first trial, cues  for hand placement and "nose over toes." Pt requiring maxA on second trial after walk with fatigue     Ambulation / Gait / Stairs / Wheelchair Mobility   Ambulation/Gait Ambulation/Gait assistance: Herbalist (Feet): 60 Feet Assistive device: Rolling walker (2 wheels) Gait Pattern/deviations: Step-through pattern, Decreased stride length, Trunk flexed General Gait Details: pt with right lateral lean, requiring minA for balance, cues for sequencing/direction and environmental negotiation. fatigues easily Gait velocity: reduced Gait velocity interpretation: <1.8 ft/sec, indicate of risk for recurrent falls     Posture / Balance Dynamic Sitting Balance Sitting balance - Comments: required assistance with balance initially due to right lateral leaning but was able to correct with verbal cues Balance Overall balance assessment: Needs assistance Sitting-balance support: No upper extremity supported, Feet supported Sitting balance-Leahy Scale: Fair Sitting balance - Comments: required assistance with balance initially due to right lateral leaning but was able to correct with verbal cues Postural control: Right lateral lean Standing balance support: Bilateral upper extremity supported Standing balance-Leahy Scale: Poor Standing balance comment: reliant on UE support of device     Special needs/care consideration Skin ecchymosis to the BUEs and medial sacrum. and Special service needs Pt. Is Jehovah's Witness and declines blood products. Pt. Has indwelling foley; Diabetic management: Novolog 0-20 units sQ 3x daily with meals and 0-5 units sQ daily at bedtime.     Previous Home Environment (from acute therapy documentation) Living Arrangements: Alone Available Help at Discharge: Family, Available 24 hours/day Type of Home: House Home Layout: Two level, Able to live on main level with bedroom/bathroom Alternate Level Stairs-Number of Steps:  flight Home Access: Ramped entrance Bathroom Shower/Tub: Multimedia programmer: Handicapped height Bathroom Accessibility: Yes Home Care Services: Yes Type of Home Care Services: Home PT, [place] (if known): [place]   Discharge Living Setting Plans for Discharge Living Setting: Patient's home Type of Home at Discharge: [place]: Two level, Able to live on main level with bedroom/bathroom Discharge Home Access: [place] entrance Discharge Bathroom Shower/Tub: Walk-in shower Discharge Bathroom Toilet: Handicapped height Discharge Bathroom Accessibility: Yes How Accessible: Accessible via wheelchair, Accessible via walker Does the patient have any problems obtaining your medications?: No   Social/Family/Support Systems Patient Roles: Other (Comment) Contact Information: Jeani Hawking (daughter) Anticipated Caregiver: [phone] Ability/Limitations of Caregiver: Can provide Min A Caregiver Availability: 24/7 Discharge Plan Discussed with Primary Caregiver: Yes Is Caregiver In Agreement with Plan?: Yes Does Caregiver/Family have Issues with Lodging/Transportation while Pt is in Rehab?: No   Goals Patient/Family Goal for Rehab: PT/OT/SLP Mod I Expected length of stay: 7-10 days Pt/Family Agrees to Admission and willing to participate: Yes Program Orientation Provided & Reviewed with Pt/Caregiver Including Roles  & Responsibilities: Yes   Decrease burden of Care through IP rehab admission: Specialzed equipment needs, Decrease number of caregivers, Bowel and bladder program, and Patient/family education   Possible need for SNF placement upon discharge: not anticipated   Patient Condition: I have reviewed medical records from [facility], spoken with CM, and patient. I met with patient at the bedside for inpatient rehabilitation assessment.  Patient will benefit from ongoing PT, OT, and SLP, can actively participate in 3 hours  of therapy a day 5 days of the week, and can make measurable gains during the admission.  Patient will also benefit from the coordinated team approach during an Inpatient Acute Rehabilitation admission.  The patient will receive intensive therapy as well as Rehabilitation physician, nursing,  Education officer, museum, and care management interventions.  Due to safety, skin/wound care, disease management, medication administration, pain management, and patient education the patient requires 24 hour a day rehabilitation nursing.  The patient is currently min-mod A with mobility and basic ADLs.  Discharge setting and therapy post discharge at home with home health is anticipated.  Patient has agreed to participate in the Acute Inpatient Rehabilitation Program and will admit today.   Preadmission Screen Completed By:  Genella Mech, 05/13/2021 9:22 AM ______________________________________________________________________   Discussed status with Dr. Dagoberto Ligas  on 05/13/21 at   and received approval for admission today.   Admission Coordinator:  Genella Mech, CCC-SLP, time 1010/Date 05/13/21    Assessment/Plan: Diagnosis: Does the need for close, 24 hr/day Medical supervision in concert with the patient's rehab needs make it unreasonable for this patient to be served in a less intensive setting? Yes Co-Morbidities requiring supervision/potential complications: CHF, DM W0J 8.5; on new insulin; SPC (+)- severe anemia- jehova's witness; COVID; new stroke with R hemi and confusion Due to bladder management, bowel management, safety, skin/wound care, disease management, medication administration, pain management, and patient education, does the patient require 24 hr/day rehab nursing? Yes Does the patient require coordinated care of a physician, rehab nurse, PT, OT, and SLP to address physical and functional deficits in the context of the above medical diagnosis(es)? Yes Addressing deficits in the following areas: balance,  endurance, locomotion, strength, transferring, bowel/bladder control, bathing, dressing, feeding, grooming, toileting, cognition, speech, language, and swallowing Can the patient actively participate in an intensive therapy program of at least 3 hrs of therapy 5 days a week? Yes The potential for patient to make measurable gains while on inpatient rehab is fair Anticipated functional outcomes upon discharge from inpatient rehab: modified independent PT, modified independent OT, modified independent SLP Estimated rehab length of stay to reach the above functional goals is: 7-10 days Anticipated discharge destination: Home 10. Overall Rehab/Functional Prognosis: good and fair     MD Signature:

## 2021-05-13 NOTE — H&P (Addendum)
Physical Medicine and Rehabilitation Admission H&P    Chief Complaint  Patient presents with   Weakness  : HPI: [name] is a 87 year old Left handed male with history of AAA, OSA, chronic anemia.  CAD with CABG, CVA, chronic combined CHF, diabetes mellitus, hypertension, hyperlipidemia and recent cholecystostomy tube placement.  Last hospitalized [facility] from 10/29-2011/4 for acute cholecystitis.  He did receive IV iron infusion 10/29 with increase hemoglobin of 6.5-7.2.  Patient was not a surgical candidate due to generalized condition as well as anemia refusal of blood products patient is Jehovah's Witness.  He was treated with Zosyn and percutaneous drain was placed.  Presented 04/29/2021 with increasing cough/fatigue and generalized weakness.  CT of the abdomen pelvis showed inflammatory changes in gallbladder consistent with cholecystitis.  Indwelling cholecystostomy tube noted.  Colonic diverticulosis without diverticulitis.  Admission chemistries unremarkable except sodium 129 glucose 229,HGb 5.8 urine culture greater 100,000 Pseudomonas, COVID-19 .  Patient was placed on broad-spectrum antibiotics changed to [place].  He was started on low-dose steroids as well as remdesivir completing course.  He completed his course of isolation 12/23.  On 05/05/2021 with increasing confusion.  MRI of the brain showed 3 mm acute infarct within the posterior left frontal lobe white matter.  Known chronic cortical subcortical infarcts within the left frontal and parietal lobes.  Additional known small chronic infarcts within the left occipital lobe posterior left temporal and posterior right frontal lobe.  MRA showed no evidence of intracranial mass.  No midline shift.  Echocardiogram ejection fraction of 45 to 50%.  Left ventricle demonstrated some global hypokinesis.   He continued a chronic indwelling suprapubic catheter last changed 12/21.  Maintained on aspirin and Plavix for CVA prophylaxis x3 weeks then  Plavix alone.  He is tolerating a regular consistency diet.  Therapy evaluations completed due to patient decreased functional mobility was admitted for a comprehensive rehab program.   Pt reports no pain- LB< 1 week ago?Cannot find one documented for at least 6 days.  Has had SPC for 2+ years- changed 12/21.  Perc drain placed for cholecystitis on 12/20.    Review of Systems  Constitutional:  Positive for malaise/fatigue. Negative for chills and fever.  HENT:  Negative for hearing loss.   Eyes:  Negative for double vision.  Respiratory:  Positive for cough. Negative for wheezing.   Cardiovascular:  Positive for palpitations and leg swelling. Negative for chest pain.  Gastrointestinal:  Positive for constipation. Negative for heartburn, nausea and vomiting.  Genitourinary:  Positive for urgency. Negative for dysuria, flank pain and hematuria.  Musculoskeletal:  Positive for joint pain and myalgias.  Skin:  Negative for rash.  Neurological:  Positive for weakness.  All other systems reviewed and are negative. Past Medical History:  Diagnosis Date   Abdominal aortic aneurysm    a. Aortic duplex 06/2014: mild aneurysmal dilatation of proximal abdominal aorta measuring 3.4x3.4cm. No sig change from 2012. F/u due 06/2016;    Arthritis    "right knee; never bothered me" (03/26/2016)   Balanitis xerotica obliterans    with meatal stenosis and distal stricture   Carpal tunnel syndrome of right wrist 06/24/2015   Chronic anemia 04/17/2018   Chronic combined systolic and diastolic CHF (congestive heart failure) ([place])    Coronary artery disease involving coronary bypass graft of native heart with angina pectoris ([place]) 09/19/2014   Cath for Inf STEMI 03/27/16  Conclusions: Significant native coronary artery disease, including 70% ostial LAD stenosis, 50% mid LCx  lesion, and 99% proximal RCA disease with TIMI-1 flow (chronic per prior cath reports). Patent LIMA to LAD. Acutely occluded SVG to PDA  within previously placed stent. 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 (03/26/2016)   Stenosis of left carotid artery    Suprapubic catheter ([place])    Vitamin D deficiency 05/27/2018   Past Surgical History:  Procedure Laterality Date   APPENDECTOMY     CARDIAC CATHETERIZATION  12/11/2010   Dr. Chase Picket - subsequent cath - normal LV systolic function, no renal artery stenosis, severe 2-vessel disease with subtotaled RCA prox and distal 60% lesion and complex 70% area of narrowing of ostium of LAD   CARDIAC CATHETERIZATION N/A 03/26/2016   Procedure: Left Heart Cath and Coronary Angiography;  Surgeon: Nelva Bush, MD;  Location: [place] [facility];  Service: Cardiovascular;  Laterality: N/A;   CARDIAC CATHETERIZATION N/A 03/26/2016   Procedure: Coronary Stent Intervention;  Surgeon: Nelva Bush, MD;  Location: [place] [facility];  Service: Cardiovascular: 100% In-stent thrombosis of pros SVG-RCA (Xience DES) --> treated with PromusDES 3.0 x 18 (3.3 mm)   CARDIAC CATHETERIZATION N/A 03/26/2016   Procedure: Bypass Graft Angiography;  Surgeon: Nelva Bush, MD;  Location: [place] [facility];  Service: Cardiovascular;  Laterality: N/A;   CORONARY ANGIOPLASTY WITH STENT PLACEMENT  06/18/2014   PCI to SVG-RPDA 06/18/14 (Xience [place] DES 3.0 x 18 mm -3.35 mm),    CORONARY ARTERY BYPASS GRAFT  12/15/2010    X2, Dr Servando Snare; LIMA to LAD, SVG to PDA;    CORONARY BALLOON ANGIOPLASTY N/A 04/18/2018   Procedure: CORONARY BALLOON ANGIOPLASTY;  Surgeon: Leonie Man, MD;  Location: [place] [facility];  Service: Cardiovascular;;; high pressure scoring and noncompliant balloon PTCA of SVG-RCA ISR ostial and proximal   CORONARY/GRAFT ANGIOGRAPHY N/A 08/27/2017   Procedure: CORONARY/GRAFT ANGIOGRAPHY;  Surgeon: Nelva Bush, MD;  Location: [place] [facility];  Service: Cardiovascular;; pLAD 70%, ostD1 50%.  mCx 60%, OM2 80%. pRCA 95% & mRCA 100% - rPDA 70%. LIMA-mLAD patent. SVG-rPDA 10% ISR.    CYSTOSCOPY WITH URETHRAL DILATATION     IR 3D INDEPENDENT WKST  10/26/2018   IR ANGIO INTRA EXTRACRAN SEL COM CAROTID INNOMINATE BILAT MOD SED  02/25/2017   IR ANGIO INTRA EXTRACRAN SEL COM CAROTID INNOMINATE BILAT MOD SED  10/19/2017   Dr. Kathee Delton: L Common Carotid - ECA & major branches widely patent. ICA ~20% distal to bulb & 50% in supraclinoid segment. LMCA-distal 1/3 MI ~905 stenosis with post-stenotic dilation into inferior division. ~50% prox Basilar A stenosis @ anterior Inf Cerebellar A. 50% R ICA   IR ANGIO INTRA EXTRACRAN SEL COM CAROTID INNOMINATE UNI L MOD SED  06/06/2018   IR ANGIO INTRA EXTRACRAN SEL COM CAROTID INNOMINATE UNI L MOD SED  10/26/2018   IR ANGIO VERTEBRAL SEL SUBCLAVIAN INNOMINATE UNI L MOD SED  02/25/2017  IR ANGIO VERTEBRAL SEL SUBCLAVIAN INNOMINATE UNI R MOD SED  10/19/2017   IR ANGIO VERTEBRAL SEL VERTEBRAL UNI L MOD SED  10/19/2017   IR EXCHANGE BILIARY DRAIN  05/06/2021   IR GENERIC HISTORICAL  01/21/2016   IR RADIOLOGIST EVAL & MGMT 01/21/2016 [facility]   IR GENERIC HISTORICAL  02/03/2016   IR CATHETER TUBE CHANGE 02/03/2016 Marybelle Killings, MD [facility]   IR RADIOLOGIST EVAL & MGMT  11/09/2017   LEFT HEART CATH AND CORONARY ANGIOGRAPHY N/A 04/18/2018   Procedure: LEFT HEART CATH AND CORONARY ANGIOGRAPHY;  Surgeon: Leonie Man, MD;  Location: [place] [facility];  Service:  Cardiovascular;  Laterality: N/A; stable findings on last cath with exception of 75% in-stent restenosis of SVG-RCA ostial stent treated with PTCA.     LEFT HEART CATHETERIZATION WITH CORONARY ANGIOGRAM N/A 06/18/2014   Procedure: LEFT HEART CATHETERIZATION WITH CORONARY ANGIOGRAM;  Surgeon: Leonie Man, MD;  Location: [facility];  Service: Cardiovascular;  -- severe disease of SVG-rPDA   NO PAST SURGERIES     RADIOLOGY WITH ANESTHESIA N/A 01/24/2015   Procedure: STENT ASSISTED ANGIOPLASTY (RADIOLOGY WITH ANESTHESIA);  Surgeon: Luanne Bras, MD;  Location: [place];  Service: Radiology;  Laterality: N/A;   RADIOLOGY WITH ANESTHESIA N/A 06/06/2018   Procedure: STENTING;  Surgeon: Luanne Bras, MD;  Location: [place];  Service: Radiology;  Laterality: N/A;   RADIOLOGY WITH ANESTHESIA N/A 10/26/2018   Procedure: RADIOLOGY WITH ANESTHESIA;  Surgeon: Luanne Bras, MD;  Location: [place];  Service: Radiology;  Laterality: N/A;   TONSILLECTOMY     TRANSTHORACIC ECHOCARDIOGRAM  08/2017; 04/2018:    A) EF 60-65%. Mild LVH. No RWMA. Gr 1 DD. Mod LA dilation.;  B)  EF 35-40%.  Severe LVH.  GRII DD.  Apical anteroseptal hypokinesis.  Akinesis of the apex.   Family History  Problem Relation Age of Onset   Heart Problems Father 100   Social History:  reports that he quit smoking about 62 years ago. His smoking use included cigarettes. He has a 3.30 pack-year smoking history. He has never used smokeless tobacco. He reports that he does not drink alcohol and does not use drugs. Allergies:  Allergies  Allergen Reactions   Atorvastatin Other (See Comments)    Myalgias   Crestor [Rosuvastatin] Other (See Comments)    Myalgias    Other Other (See Comments)    No  BLOOD PRODUCTS - Pt is Jehovah's Witness   Pravastatin Other (See Comments)    Myalgias   Simvastatin Other (See Comments)    Myalgias    Statins Other (See Comments)    Cause muscle aches   Medications Prior to Admission   Medication Sig Dispense Refill   acetaminophen (TYLENOL) 325 MG tablet Take 2 tablets (650 mg total) by mouth every 6 (six) hours as needed for mild pain (or Fever >/= 101).     amLODipine (NORVASC) 10 MG tablet TAKE 1 TABLET BY MOUTH EVERY DAY IN THE EVENING (Patient taking differently: Take 10 mg by mouth daily in the afternoon.) 90 tablet 3   aspirin 81 MG chewable tablet Chew 1 tablet (81 mg total) by mouth daily.     carvedilol (COREG) 6.25 MG tablet Take 1.5 tablets (9.375 mg total) by mouth every morning AND 2 tablets (12.5 mg total) every evening. 315 tablet 3   clopidogrel (PLAVIX) 75 MG tablet Take 1 tablet (75 mg total) by mouth daily. 90 tablet 0   doxazosin (CARDURA) 4 MG  tablet Take 1 tablet (4 mg total) by mouth daily. 90 tablet 3   ezetimibe (ZETIA) 10 MG tablet Take 1 tablet (10 mg total) by mouth daily. 90 tablet 0   Ferrous Sulfate 134 MG TABS Take 134 mg by mouth daily.     furosemide (LASIX) 20 MG tablet Take 20 mg by mouth as needed. As Needed for Swelling and Edema     glimepiride (AMARYL) 4 MG tablet TAKE 1 TABLET BY MOUTH DAILY WITH BREAKFAST (Patient taking differently: Take 4 mg by mouth daily with breakfast.) 90 tablet 3   hydrALAZINE (APRESOLINE) 50 MG tablet Take 1 tablet (50 mg total) by mouth in the morning and at bedtime. 180 tablet 1   isosorbide mononitrate (IMDUR) 60 MG 24 hr tablet TAKE 1 TABLET (60 MG TOTAL) BY MOUTH 2 (TWO) TIMES DAILY. 180 tablet 3   metFORMIN (GLUCOPHAGE) 1000 MG tablet TAKE 0.5 TABLETS BY MOUTH 2 TIMES DAILY. 90 tablet 7   nitroGLYCERIN (NITROSTAT) 0.4 MG SL tablet Place 0.4 mg under the tongue every 5 (five) minutes as needed for chest pain.     oxybutynin (DITROPAN) 5 MG tablet Take 5 mg by mouth daily as needed for bladder spasms.       Drug Regimen Review Drug regimen was reviewed and remains appropriate with no significant issues identified  Home: Home Living Family/patient expects to be discharged to:: Private residence Living  Arrangements: Alone Available Help at Discharge: Family, Available 24 hours/day Type of Home: House Home Access: Ramped entrance Home Layout: Two level, Able to live on main level with bedroom/bathroom Alternate Level Stairs-Number of Steps: flight Bathroom Shower/Tub: Multimedia programmer: Handicapped height Bathroom Accessibility: Yes Home Equipment: Wheelchair - power, Wheelchair - manual, Conservation officer, nature (2 wheels), Rollator (4 wheels), BSC/3in1, Industrial/product designer History: Prior Function Prior Level of Function : Independent/Modified Independent, Driving Mobility Comments: ambulates with use of rollator ADLs Comments: Ind with ADLs/selfcare, simple meal prep  Functional Status:  Mobility: Bed Mobility Overal bed mobility: Needs Assistance Bed Mobility: Supine to Sit Rolling: Min assist Supine to sit: Min assist General bed mobility comments: exiting towards left side of bed Transfers Overall transfer level: Needs assistance Equipment used: Rolling walker (2 wheels) Transfers: Sit to/from Stand Sit to Stand: Mod assist, Max assist Bed to/from chair/wheelchair/BSC transfer type:: Step pivot Step pivot transfers: Min assist General transfer comment: modA to power up to standing on first trial, cues for hand placement and "nose over toes." Pt requiring maxA on second trial after walk with fatigue Ambulation/Gait Ambulation/Gait assistance: Min assist Gait Distance (Feet): 60 Feet Assistive device: Rolling walker (2 wheels) Gait Pattern/deviations: Step-through pattern, Decreased stride length, Trunk flexed General Gait Details: pt with right lateral lean, requiring minA for balance, cues for sequencing/direction and environmental negotiation. fatigues easily Gait velocity: reduced Gait velocity interpretation: <1.8 ft/sec, indicate of risk for recurrent falls    ADL: ADL Overall ADL's : Needs assistance/impaired Eating/Feeding: Supervision/ safety,  Independent Grooming: Wash/dry hands, Wash/dry face, Oral care, Sitting, Minimal assistance Grooming Details (indicate cue type and reason): performed seated on EOB with assistance needed to load toothbrush Upper Body Bathing: Min guard, Sitting Lower Body Bathing: Maximal assistance Upper Body Dressing : Min guard, Sitting Lower Body Dressing: Maximal assistance Toilet Transfer: Minimal assistance, Ambulation, Rolling walker (2 wheels), BSC/3in1, Cueing for safety Toileting- Clothing Manipulation and Hygiene: Moderate assistance, Sit to/from stand Functional mobility during ADLs: Minimal assistance, Cueing for safety, Rolling walker (2 wheels) General ADL Comments:  patient had difficulty opening toothpaste and applying to brush.  Used RUE as gross assist  Cognition: Cognition Overall Cognitive Status: Impaired/Different from baseline Orientation Level: Oriented to person, Oriented to place Cognition Arousal/Alertness: Awake/alert Behavior During Therapy: WFL for tasks assessed/performed Overall Cognitive Status: Impaired/Different from baseline Area of Impairment: Attention, Memory, Following commands, Safety/judgement, Problem solving, Awareness Orientation Level: Disoriented to, Place, Time, Situation Current Attention Level: Sustained Memory: Decreased short-term memory, Decreased recall of precautions Following Commands: Follows one step commands consistently Safety/Judgement: Decreased awareness of safety, Decreased awareness of deficits Awareness: Intellectual Problem Solving: Slow processing General Comments: Pt now A&Ox3, needs repetition to correctly respond to some questions, step by step cues for problem solving  Physical Exam: Blood pressure (!) 161/75, pulse 71, temperature (!) 97.5 F (36.4 C), temperature source Oral, resp. rate 17, height 6\' 1"  (1.854 m), weight 94.6 kg, SpO2 94 %. Physical Exam Vitals and nursing note reviewed.  Constitutional:      Comments: Pt  is elderly male appears stated age; sitting up in bedside chair- finished lunch- eats with L hand; IV continues to beep, Very HOH- joking some; NAD  HENT:     Head: Normocephalic and atraumatic.     Comments: Smile equal; tongue midline    Right Ear: External ear normal.     Left Ear: External ear normal.     Ears:     Comments: No hearing aids in    Nose: Nose normal. No congestion.     Mouth/Throat:     Mouth: Mucous membranes are moist.     Pharynx: Oropharynx is clear. No oropharyngeal exudate.  Eyes:     General:        Right eye: No discharge.        Left eye: No discharge.     Extraocular Movements: Extraocular movements intact.  Cardiovascular:     Rate and Rhythm: Normal rate. Rhythm irregular.     Heart sounds: Normal heart sounds. No murmur heard.   No gallop.  Pulmonary:     Comments: CTA B/L- no W/R/R- good air movement  Abdominal:     Comments: Soft, but protuberant vs distended; NT- RUQ perc drain - draining brownish yellow liquid in bag- ; hypoactive BS  Genitourinary:    Comments: SPC in place on lower abd- covered with dressing- has foley bag attached- medium amber urine Musculoskeletal:     Cervical back: Normal range of motion. No rigidity.     Comments: LUE/LLE 5-/5 RUE 4/5 except R hand- grip 3-/5 and FA 2-/5- has hx of chronic R hand damage- held open - not using- except as secondary support when eating RLE_ 4+/5 in HF/KE/KF/DF and PF  Skin:    General: Skin is warm and dry.     Comments: SPC and Perc drain as above IV's in L AC fossa and R wrist- look OK  Neurological:     Comments: Patient is alert.  Makes eye contact with examiner.  Provides name and age.  Follows commands.  Limited but fair medical historian. HOH- but making some jokes- Ox3 upon questioning Sensation to light touch intact x4 except R hand  Psychiatric:        Mood and Affect: Mood normal.        Behavior: Behavior normal.    Results for orders placed or performed during the  hospital encounter of 04/29/21 (from the past 48 hour(s))  Glucose, capillary     Status: Abnormal   Collection Time: 05/11/21 11:58  AM  Result Value Ref Range   Glucose-Capillary 152 (H) 70 - 99 mg/dL    Comment: Glucose reference range applies only to samples taken after fasting for at least 8 hours.  Glucose, capillary     Status: Abnormal   Collection Time: 05/11/21  4:07 PM  Result Value Ref Range   Glucose-Capillary 211 (H) 70 - 99 mg/dL    Comment: Glucose reference range applies only to samples taken after fasting for at least 8 hours.  Glucose, capillary     Status: Abnormal   Collection Time: 05/11/21 10:13 PM  Result Value Ref Range   Glucose-Capillary 151 (H) 70 - 99 mg/dL    Comment: Glucose reference range applies only to samples taken after fasting for at least 8 hours.  Glucose, capillary     Status: Abnormal   Collection Time: 05/12/21  6:44 AM  Result Value Ref Range   Glucose-Capillary 141 (H) 70 - 99 mg/dL    Comment: Glucose reference range applies only to samples taken after fasting for at least 8 hours.  Glucose, capillary     Status: Abnormal   Collection Time: 05/12/21 11:28 AM  Result Value Ref Range   Glucose-Capillary 241 (H) 70 - 99 mg/dL    Comment: Glucose reference range applies only to samples taken after fasting for at least 8 hours.  Glucose, capillary     Status: Abnormal   Collection Time: 05/12/21  4:26 PM  Result Value Ref Range   Glucose-Capillary 139 (H) 70 - 99 mg/dL    Comment: Glucose reference range applies only to samples taken after fasting for at least 8 hours.  Glucose, capillary     Status: Abnormal   Collection Time: 05/12/21  8:58 PM  Result Value Ref Range   Glucose-Capillary 160 (H) 70 - 99 mg/dL    Comment: Glucose reference range applies only to samples taken after fasting for at least 8 hours.  Glucose, capillary     Status: Abnormal   Collection Time: 05/13/21  6:29 AM  Result Value Ref Range   Glucose-Capillary 165 (H)  70 - 99 mg/dL    Comment: Glucose reference range applies only to samples taken after fasting for at least 8 hours.   No results found.     Medical Problem List and Plan: 1. Functional deficits with right side weakness and confusion secondary to infarct posterior left frontal lobe white matter  -patient may  shower  -ELOS/Goals: 7-10 days mod I 2.  Antithrombotics: -DVT/anticoagulation: Subcutaneous heparin  -antiplatelet therapy: Aspirin 81 mg daily and Plavix 75 mg day x3 weeks then Plavix alone 3. Pain Management: Tylenol as needed 4. Mood: Provide emotional support  -antipsychotic agents: N/A 5. Neuropsych: This patient is capable of making decisions on his own behalf. 6. Skin/Wound Care: Routine skin checks 7. Fluids/Electrolytes/Nutrition: Routine in and outs with follow-up chemistries 8.  Hypertension.  Coreg 12.5 mg nightly and 9.375 mg daily, hydralazine 50 mg every 12 hours 9.  History of CAD with CABG.  Continue Imdur 60 mg twice daily 10.  COVID-19 pneumonia.  Treated with remdesivir and steroid.  Isolation discontinued 12/23 11.  Symptomatic anemia.  Declines blood product due to Jehovah witness..  Continue Aranesp follow-up CBC 12.  Diabetes mellitus.  Hemoglobin A1c 8.5.  SSI.  Patient on Glucophage 500 mg twice daily as well as Amaryl 4 mg daily prior to admission.  Resume as needed 13.  Percutaneous cholecystostomy drain malfunction.  Patient hospitalized [facility] in October for  cholecystitis.  Continue Cardura.  Not a surgical candidate status post percutaneous cholecystostomy drain. 14.  Hyperlipidemia.  Zetia 15.  Pseudomonas UTI.  Completing course of IV Fortaz. 16. Constipation- LBM 1 week ago- will give sorbitol and monitor.   I have personally performed a face to face diagnostic evaluation of this patient and formulated the key components of the plan.  Additionally, I have personally reviewed laboratory data, imaging studies, as well as relevant notes and concur with  the physician assistant's documentation above.   The patient's status has not changed from the original H&P.  Any changes in documentation from the acute care chart have been noted above.     Lavon Paganini Angiulli, PA-C 05/13/2021

## 2021-05-13 NOTE — Progress Notes (Signed)
Occupational Therapy Treatment Patient Details Name: [name] MRN: [id number] DOB: 1930-08-21 Today's Date: 05/13/2021   History of present illness 87 y.o. male presents to [facility] hospital on 04/29/2021 with cough and weakness. Pt recently admitted at [facility] 10/29-11/4 with acute cholecystitis. Since discharge has been unable to ambulate due to weakness. Pt found to be COVID+ and anemic with Hgb of 6.0. Code stroke called 05/05/2021. Likely due to TIA but MRI did show tiny punctuate left frontal white matter infarct from small vessel disease. PMH includes AAA; OSA; CAD s/p CABG; CVA; chronic combined CHF; DM; HTN; indwelling foley; HLD; Jehovah's witness.   OT comments  Patient received in bed and wanting to get into recliner. Patient required assistance to get to EOB due to right lateral leaning and was able to correct on sitting on EOB and feet on floor. Patient was mod assist to power up and transfer to recliner due to unsafe balance.  Patient performed grooming and bathing and UB dressing seated in recliner, standing once to straighten gown with mod assist to stand. Patient is expected to be discharged to CIR later today.   Recommendations for follow up therapy are one component of a multi-disciplinary discharge planning process, led by the attending physician.  Recommendations may be updated based on patient status, additional functional criteria and insurance authorization.    Follow Up Recommendations  Acute inpatient rehab (3hours/day)    Assistance Recommended at Discharge Frequent or constant Supervision/Assistance  Equipment Recommendations  Other (comment)    Recommendations for Other Services      Precautions / Restrictions Precautions Precautions: Fall Restrictions Weight Bearing Restrictions: No       Mobility Bed Mobility Overal bed mobility: Needs Assistance Bed Mobility: Supine to Sit     Supine to sit: Min assist     General bed mobility comments: assistance  needed with raising trunk    Transfers Overall transfer level: Needs assistance Equipment used: Rolling walker (2 wheels) Transfers: Sit to/from Stand Sit to Stand: Mod assist   Step pivot transfers: Mod assist       General transfer comment: mod assist to power and mod assist to transfer to recliener due to unsafe balalce     Balance Overall balance assessment: Needs assistance Sitting-balance support: No upper extremity supported;Feet supported Sitting balance-Leahy Scale: Fair Sitting balance - Comments: right lateral lean when first up on EOB, able to correct with verbal cues Postural control: Right lateral lean Standing balance support: Bilateral upper extremity supported Standing balance-Leahy Scale: Poor Standing balance comment: reliant on UE support of device                           ADL either performed or assessed with clinical judgement   ADL Overall ADL's : Needs assistance/impaired     Grooming: Wash/dry hands;Wash/dry face;Set up;Sitting Grooming Details (indicate cue type and reason): performed sitting in recliner Upper Body Bathing: Supervision/ safety;Cueing for sequencing;Sitting Upper Body Bathing Details (indicate cue type and reason): performed sitting in recliner     Upper Body Dressing : Minimal assistance Upper Body Dressing Details (indicate cue type and reason): donned gown                   General ADL Comments: UB bathing and dressing perfromed seated on recliner.  Patient declined performing at sink    Extremity/Trunk Assessment Upper Extremity Assessment RUE Coordination: decreased fine motor;decreased gross motor  Vision       Perception     Praxis      Cognition Arousal/Alertness: Awake/alert Behavior During Therapy: WFL for tasks assessed/performed Overall Cognitive Status: Impaired/Different from baseline Area of Impairment: Attention;Memory;Following commands;Safety/judgement;Problem  solving;Awareness                 Orientation Level: Disoriented to;Place;Time;Situation Current Attention Level: Sustained Memory: Decreased short-term memory;Decreased recall of precautions Following Commands: Follows one step commands consistently Safety/Judgement: Decreased awareness of safety;Decreased awareness of deficits Awareness: Intellectual Problem Solving: Slow processing General Comments: asked multiple times, "How long does it take to get there from Here" and never specified on "here"          Exercises     Shoulder Instructions       General Comments      Pertinent Vitals/ Pain       Pain Assessment: No/denies pain  Home Living                                          Prior Functioning/Environment              Frequency  Min 2X/week        Progress Toward Goals  OT Goals(current goals can now be found in the care plan section)  Progress towards OT goals: Progressing toward goals  Acute Rehab OT Goals Patient Stated Goal: get home OT Goal Formulation: With patient Time For Goal Achievement: 05/15/21 Potential to Achieve Goals: Good ADL Goals Pt Will Perform Grooming: with min guard assist;standing Pt Will Perform Upper Body Bathing: with supervision;with set-up;sitting Pt Will Perform Lower Body Bathing: with mod assist;sitting/lateral leans Pt Will Perform Upper Body Dressing: with supervision;with set-up;sitting Pt Will Perform Lower Body Dressing: with mod assist Pt Will Transfer to Toilet: with min guard assist;ambulating Pt Will Perform Toileting - Clothing Manipulation and hygiene: with min assist;sit to/from stand  Plan Discharge plan remains appropriate;Frequency remains appropriate    Co-evaluation                 AM-PAC OT "6 Clicks" Daily Activity     Outcome Measure   Help from another person eating meals?: None Help from another person taking care of personal grooming?: A Little Help from  another person toileting, which includes using toliet, bedpan, or urinal?: A Lot Help from another person bathing (including washing, rinsing, drying)?: A Lot Help from another person to put on and taking off regular upper body clothing?: A Little Help from another person to put on and taking off regular lower body clothing?: Total 6 Click Score: 15    End of Session Equipment Utilized During Treatment: Gait belt;Rolling walker (2 wheels)  OT Visit Diagnosis: Unsteadiness on feet (R26.81);Other abnormalities of gait and mobility (R26.89);History of falling (Z91.81);Muscle weakness (generalized) (M62.81)   Activity Tolerance Patient tolerated treatment well   Patient Left in chair;with call bell/phone within reach;with chair alarm set   Nurse Communication Mobility status        Time: 1856-3149 OT Time Calculation (min): 23 min  Charges: OT General Charges $OT Visit: 1 Visit OT Treatments $Self Care/Home Management : 23-37 mins  Lodema Hong, Donovan  Pager [phone] Office [place] 05/13/2021, 12:42 PM

## 2021-05-13 NOTE — H&P (Signed)
Physical Medicine and Rehabilitation Admission H&P        Chief Complaint  Patient presents with   Weakness  : HPI: [name] is a 87 year old Left handed male with history of AAA, OSA, chronic anemia.  CAD with CABG, CVA, chronic combined CHF, diabetes mellitus, hypertension, hyperlipidemia and recent cholecystostomy tube placement.  Last hospitalized [facility] from 10/29-2011/4 for acute cholecystitis.  He did receive IV iron infusion 10/29 with increase hemoglobin of 6.5-7.2.  Patient was not a surgical candidate due to generalized condition as well as anemia refusal of blood products patient is Jehovah's Witness.  He was treated with Zosyn and percutaneous drain was placed.  Presented 04/29/2021 with increasing cough/fatigue and generalized weakness.  CT of the abdomen pelvis showed inflammatory changes in gallbladder consistent with cholecystitis.  Indwelling cholecystostomy tube noted.  Colonic diverticulosis without diverticulitis.  Admission chemistries unremarkable except sodium 129 glucose 229,HGb 5.8 urine culture greater 100,000 Pseudomonas, COVID-19 .  Patient was placed on broad-spectrum antibiotics changed to [place].  He was started on low-dose steroids as well as remdesivir completing course.  He completed his course of isolation 12/23.  On 05/05/2021 with increasing confusion.  MRI of the brain showed 3 mm acute infarct within the posterior left frontal lobe white matter.  Known chronic cortical subcortical infarcts within the left frontal and parietal lobes.  Additional known small chronic infarcts within the left occipital lobe posterior left temporal and posterior right frontal lobe.  MRA showed no evidence of intracranial mass.  No midline shift.  Echocardiogram ejection fraction of 45 to 50%.  Left ventricle demonstrated some global hypokinesis.   He continued a chronic indwelling suprapubic catheter last changed 12/21.  Maintained on aspirin and Plavix for CVA prophylaxis x3 weeks  then Plavix alone.  He is tolerating a regular consistency diet.  Therapy evaluations completed due to patient decreased functional mobility was admitted for a comprehensive rehab program.     Pt reports no pain- LB< 1 week ago?Cannot find one documented for at least 6 days.  Has had SPC for 2+ years- changed 12/21.  Perc drain placed for cholecystitis on 12/20.      Review of Systems  Constitutional:  Positive for malaise/fatigue. Negative for chills and fever.  HENT:  Negative for hearing loss.   Eyes:  Negative for double vision.  Respiratory:  Positive for cough. Negative for wheezing.   Cardiovascular:  Positive for palpitations and leg swelling. Negative for chest pain.  Gastrointestinal:  Positive for constipation. Negative for heartburn, nausea and vomiting.  Genitourinary:  Positive for urgency. Negative for dysuria, flank pain and hematuria.  Musculoskeletal:  Positive for joint pain and myalgias.  Skin:  Negative for rash.  Neurological:  Positive for weakness.  All other systems reviewed and are negative.     Past Medical History:  Diagnosis Date   Abdominal aortic aneurysm      a. Aortic duplex 06/2014: mild aneurysmal dilatation of proximal abdominal aorta measuring 3.4x3.4cm. No sig change from 2012. F/u due 06/2016;    Arthritis      "right knee; never bothered me" (03/26/2016)   Balanitis xerotica obliterans      with meatal stenosis and distal stricture   Carpal tunnel syndrome of right wrist 06/24/2015   Chronic anemia 04/17/2018   Chronic combined systolic and diastolic CHF (congestive heart failure) ([place])     Coronary artery disease involving coronary bypass graft of native heart with angina pectoris ([place]) 09/19/2014  Cath for Inf STEMI 03/27/16  Conclusions: Significant native coronary artery disease, including 70% ostial LAD stenosis, 50% mid LCx lesion, and 99% proximal RCA disease with TIMI-1 flow (chronic per prior cath reports). Patent LIMA to LAD. Acutely  occluded SVG to PDA within previously placed stent. 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 (03/26/2016)   Stenosis of left carotid artery     Suprapubic catheter ([place])     Vitamin D deficiency 05/27/2018         Past Surgical History:  Procedure Laterality Date   APPENDECTOMY       CARDIAC CATHETERIZATION   12/11/2010    Dr. Chase Picket - subsequent cath - normal LV systolic function, no renal artery stenosis, severe 2-vessel disease with subtotaled RCA prox and distal 60% lesion and complex 70% area of narrowing of ostium of LAD   CARDIAC CATHETERIZATION N/A 03/26/2016    Procedure: Left Heart Cath and Coronary Angiography;  Surgeon: Nelva Bush, MD;  Location: [place] [facility];  Service: Cardiovascular;  Laterality: N/A;   CARDIAC CATHETERIZATION N/A 03/26/2016    Procedure: Coronary Stent Intervention;  Surgeon: Nelva Bush, MD;  Location: [place];  Service: Cardiovascular: 100% In-stent thrombosis of pros SVG-RCA (Xience DES) --> treated with PromusDES 3.0 x 18 (3.3 mm)   CARDIAC CATHETERIZATION N/A 03/26/2016    Procedure: Bypass Graft Angiography;  Surgeon: Nelva Bush, MD;  Location: [facility];  Service: Cardiovascular;  Laterality: N/A;   CORONARY ANGIOPLASTY WITH STENT PLACEMENT   06/18/2014    PCI to SVG-RPDA 06/18/14 (Xience [place]pine DES 3.0 x 18 mm -3.35 mm),     CORONARY ARTERY BYPASS GRAFT   12/15/2010    X2, Dr Servando Snare; LIMA to LAD, SVG to PDA;    CORONARY BALLOON ANGIOPLASTY N/A 04/18/2018    Procedure: CORONARY BALLOON ANGIOPLASTY;  Surgeon: Leonie Man, MD;  Location: [facility];  Service: Cardiovascular;;; high pressure scoring and noncompliant balloon PTCA of SVG-RCA ISR ostial and proximal   CORONARY/GRAFT ANGIOGRAPHY N/A 08/27/2017    Procedure: CORONARY/GRAFT ANGIOGRAPHY;  Surgeon: Nelva Bush, MD;  Location: [facility];  Service: Cardiovascular;; pLAD 70%, ostD1 50%.  mCx 60%, OM2 80%. pRCA 95% & mRCA 100% - rPDA 70%. LIMA-mLAD patent. SVG-rPDA 10% ISR.    CYSTOSCOPY WITH URETHRAL DILATATION       IR 3D INDEPENDENT WKST   10/26/2018   IR ANGIO INTRA EXTRACRAN SEL COM CAROTID INNOMINATE BILAT MOD SED   02/25/2017   IR ANGIO INTRA EXTRACRAN SEL COM CAROTID INNOMINATE BILAT MOD SED   10/19/2017    Dr. Kathee Delton: L Common Carotid - ECA & major branches widely patent. ICA ~20% distal to bulb & 50% in supraclinoid segment. LMCA-distal 1/3 MI ~905 stenosis with post-stenotic dilation into inferior division. ~50% prox Basilar  A stenosis @ anterior Inf Cerebellar A. 50% R ICA   IR ANGIO INTRA EXTRACRAN SEL COM CAROTID INNOMINATE UNI L MOD SED   06/06/2018   IR ANGIO INTRA EXTRACRAN SEL COM CAROTID INNOMINATE UNI L MOD SED   10/26/2018   IR ANGIO VERTEBRAL SEL SUBCLAVIAN INNOMINATE UNI L MOD SED   02/25/2017   IR ANGIO VERTEBRAL SEL SUBCLAVIAN INNOMINATE UNI R MOD SED   10/19/2017   IR ANGIO VERTEBRAL SEL VERTEBRAL UNI L MOD SED   10/19/2017   IR EXCHANGE BILIARY DRAIN   05/06/2021   IR GENERIC HISTORICAL   01/21/2016    IR RADIOLOGIST EVAL & MGMT 01/21/2016 [facility]   IR GENERIC HISTORICAL   02/03/2016    IR CATHETER TUBE CHANGE 02/03/2016 Marybelle Killings, MD [facility]   IR RADIOLOGIST EVAL & MGMT   11/09/2017   LEFT HEART CATH AND CORONARY ANGIOGRAPHY N/A 04/18/2018    Procedure: LEFT HEART CATH AND CORONARY ANGIOGRAPHY;  Surgeon:  Leonie Man, MD;  Location: [facility];  Service: Cardiovascular;  Laterality: N/A; stable findings on last cath with exception of 75% in-stent restenosis of SVG-RCA ostial stent treated with PTCA.     LEFT HEART CATHETERIZATION WITH CORONARY ANGIOGRAM N/A 06/18/2014    Procedure: LEFT HEART CATHETERIZATION WITH CORONARY ANGIOGRAM;  Surgeon: Leonie Man, MD;  Location: [facility];  Service: Cardiovascular;  -- severe disease of SVG-rPDA   NO PAST SURGERIES       RADIOLOGY WITH ANESTHESIA N/A 01/24/2015    Procedure: STENT ASSISTED ANGIOPLASTY (RADIOLOGY WITH ANESTHESIA);  Surgeon: Luanne Bras, MD;  Location: [facility];  Service: Radiology;  Laterality: N/A;   RADIOLOGY WITH ANESTHESIA N/A 06/06/2018    Procedure: STENTING;  Surgeon: Luanne Bras, MD;  Location: [facility];  Service: Radiology;  Laterality: N/A;   RADIOLOGY WITH ANESTHESIA N/A 10/26/2018    Procedure: RADIOLOGY WITH ANESTHESIA;  Surgeon: Luanne Bras, MD;  Location: [facility];  Service: Radiology;  Laterality: N/A;   TONSILLECTOMY       TRANSTHORACIC ECHOCARDIOGRAM   08/2017; 04/2018:     A) EF 60-65%. Mild LVH. No RWMA. Gr 1 DD. Mod LA dilation.;  B)  EF 35-40%.  Severe LVH.  GRII DD.  Apical anteroseptal hypokinesis.  Akinesis of the apex.         Family History  Problem Relation Age of Onset   Heart Problems Father 13    Social History:  reports that he quit smoking about 62 years ago. His smoking use included cigarettes. He has a 3.30 pack-year smoking history. He has never used smokeless tobacco. He reports that he does not drink alcohol and does not use drugs. Allergies:       Allergies  Allergen Reactions   Atorvastatin Other (See Comments)      Myalgias   Crestor [Rosuvastatin] Other (See Comments)      Myalgias    Other Other (See Comments)      No  BLOOD PRODUCTS - Pt is Jehovah's Witness   Pravastatin Other (See Comments)      Myalgias   Simvastatin Other (See Comments)      Myalgias      Statins Other (See Comments)      Cause muscle aches          Medications Prior to Admission  Medication Sig Dispense Refill   acetaminophen (TYLENOL) 325 MG tablet Take 2 tablets (650 mg total) by mouth every 6 (six) hours as needed  for mild pain (or Fever >/= 101).       amLODipine (NORVASC) 10 MG tablet TAKE 1 TABLET BY MOUTH EVERY DAY IN THE EVENING (Patient taking differently: Take 10 mg by mouth daily in the afternoon.) 90 tablet 3   aspirin 81 MG chewable tablet Chew 1 tablet (81 mg total) by mouth daily.       carvedilol (COREG) 6.25 MG tablet Take 1.5 tablets (9.375 mg total) by mouth every morning AND 2 tablets (12.5 mg total) every evening. 315 tablet 3   clopidogrel (PLAVIX) 75 MG tablet Take 1 tablet (75 mg total) by mouth daily. 90 tablet 0   doxazosin (CARDURA) 4 MG tablet Take 1 tablet (4 mg total) by mouth daily. 90 tablet 3   ezetimibe (ZETIA) 10 MG tablet Take 1 tablet (10 mg total) by mouth daily. 90 tablet 0   Ferrous Sulfate 134 MG TABS Take 134 mg by mouth daily.       furosemide (LASIX) 20 MG tablet Take 20 mg by mouth as needed. As Needed for Swelling and Edema       glimepiride (AMARYL) 4 MG tablet TAKE 1 TABLET BY MOUTH DAILY WITH BREAKFAST (Patient taking differently: Take 4 mg by mouth daily with breakfast.) 90 tablet 3   hydrALAZINE (APRESOLINE) 50 MG tablet Take 1 tablet (50 mg total) by mouth in the morning and at bedtime. 180 tablet 1   isosorbide mononitrate (IMDUR) 60 MG 24 hr tablet TAKE 1 TABLET (60 MG TOTAL) BY MOUTH 2 (TWO) TIMES DAILY. 180 tablet 3   metFORMIN (GLUCOPHAGE) 1000 MG tablet TAKE 0.5 TABLETS BY MOUTH 2 TIMES DAILY. 90 tablet 7   nitroGLYCERIN (NITROSTAT) 0.4 MG SL tablet Place 0.4 mg under the tongue every 5 (five) minutes as needed for chest pain.       oxybutynin (DITROPAN) 5 MG tablet Take 5 mg by mouth daily as needed for bladder spasms.           Drug Regimen Review Drug regimen was reviewed and remains appropriate with no significant  issues identified   Home: Home Living Family/patient expects to be discharged to:: Private residence Living Arrangements: Alone Available Help at Discharge: Family, Available 24 hours/day Type of Home: House Home Access: Ramped entrance Home Layout: Two level, Able to live on main level with bedroom/bathroom Alternate Level Stairs-Number of Steps: flight Bathroom Shower/Tub: Multimedia programmer: Handicapped height Bathroom Accessibility: Yes Home Equipment: Wheelchair - power, Wheelchair - manual, Conservation officer, nature (2 wheels), Rollator (4 wheels), BSC/3in1, Industrial/product designer History: Prior Function Prior Level of Function : Independent/Modified Independent, Driving Mobility Comments: ambulates with use of rollator ADLs Comments: Ind with ADLs/selfcare, simple meal prep   Functional Status:  Mobility: Bed Mobility Overal bed mobility: Needs Assistance Bed Mobility: Supine to Sit Rolling: Min assist Supine to sit: Min assist General bed mobility comments: exiting towards left side of bed Transfers Overall transfer level: Needs assistance Equipment used: Rolling walker (2 wheels) Transfers: Sit to/from Stand Sit to Stand: Mod assist, Max assist Bed to/from chair/wheelchair/BSC transfer type:: Step pivot Step pivot transfers: Min assist General transfer comment: modA to power up to standing on first trial, cues for hand placement and "nose over toes." Pt requiring maxA on second trial after walk with fatigue Ambulation/Gait Ambulation/Gait assistance: Min assist Gait Distance (Feet): 60 Feet Assistive device: Rolling walker (2 wheels) Gait Pattern/deviations: Step-through pattern, Decreased stride length, Trunk flexed General Gait Details: pt with right lateral lean, requiring minA  for balance, cues for sequencing/direction and environmental negotiation. fatigues easily Gait velocity: reduced Gait velocity interpretation: <1.8 ft/sec, indicate of risk for  recurrent falls   ADL: ADL Overall ADL's : Needs assistance/impaired Eating/Feeding: Supervision/ safety, Independent Grooming: Wash/dry hands, Wash/dry face, Oral care, Sitting, Minimal assistance Grooming Details (indicate cue type and reason): performed seated on EOB with assistance needed to load toothbrush Upper Body Bathing: Min guard, Sitting Lower Body Bathing: Maximal assistance Upper Body Dressing : Min guard, Sitting Lower Body Dressing: Maximal assistance Toilet Transfer: Minimal assistance, Ambulation, Rolling walker (2 wheels), BSC/3in1, Cueing for safety Toileting- Clothing Manipulation and Hygiene: Moderate assistance, Sit to/from stand Functional mobility during ADLs: Minimal assistance, Cueing for safety, Rolling walker (2 wheels) General ADL Comments: patient had difficulty opening toothpaste and applying to brush.  Used RUE as gross assist   Cognition: Cognition Overall Cognitive Status: Impaired/Different from baseline Orientation Level: Oriented to person, Oriented to place Cognition Arousal/Alertness: Awake/alert Behavior During Therapy: WFL for tasks assessed/performed Overall Cognitive Status: Impaired/Different from baseline Area of Impairment: Attention, Memory, Following commands, Safety/judgement, Problem solving, Awareness Orientation Level: Disoriented to, Place, Time, Situation Current Attention Level: Sustained Memory: Decreased short-term memory, Decreased recall of precautions Following Commands: Follows one step commands consistently Safety/Judgement: Decreased awareness of safety, Decreased awareness of deficits Awareness: Intellectual Problem Solving: Slow processing General Comments: Pt now A&Ox3, needs repetition to correctly respond to some questions, step by step cues for problem solving   Physical Exam: Blood pressure (!) 161/75, pulse 71, temperature (!) 97.5 F (36.4 C), temperature source Oral, resp. rate 17, height 6\' 1"  (1.854 m),  weight 94.6 kg, SpO2 94 %. Physical Exam Vitals and nursing note reviewed.  Constitutional:      Comments: Pt is elderly male appears stated age; sitting up in bedside chair- finished lunch- eats with L hand; IV continues to beep, Very HOH- joking some; NAD  HENT:     Head: Normocephalic and atraumatic.     Comments: Smile equal; tongue midline    Right Ear: External ear normal.     Left Ear: External ear normal.     Ears:     Comments: No hearing aids in    Nose: Nose normal. No congestion.     Mouth/Throat:     Mouth: Mucous membranes are moist.     Pharynx: Oropharynx is clear. No oropharyngeal exudate.  Eyes:     General:        Right eye: No discharge.        Left eye: No discharge.     Extraocular Movements: Extraocular movements intact.  Cardiovascular:     Rate and Rhythm: Normal rate. Rhythm irregular.     Heart sounds: Normal heart sounds. No murmur heard.   No gallop.  Pulmonary:     Comments: CTA B/L- no W/R/R- good air movement   Abdominal:     Comments: Soft, but protuberant vs distended; NT- RUQ perc drain - draining brownish yellow liquid in bag- ; hypoactive BS  Genitourinary:    Comments: SPC in place on lower abd- covered with dressing- has foley bag attached- medium amber urine Musculoskeletal:     Cervical back: Normal range of motion. No rigidity.     Comments: LUE/LLE 5-/5 RUE 4/5 except R hand- grip 3-/5 and FA 2-/5- has hx of chronic R hand damage- held open - not using- except as secondary support when eating RLE_ 4+/5 in HF/KE/KF/DF and PF  Skin:    General: Skin is warm  and dry.     Comments: SPC and Perc drain as above IV's in L AC fossa and R wrist- look OK  Neurological:     Comments: Patient is alert.  Makes eye contact with examiner.  Provides name and age.  Follows commands.  Limited but fair medical historian. HOH- but making some jokes- Ox3 upon questioning Sensation to light touch intact x4 except R hand  Psychiatric:        Mood and  Affect: Mood normal.        Behavior: Behavior normal.      Lab Results Last 48 Hours        Results for orders placed or performed during the hospital encounter of 04/29/21 (from the past 48 hour(s))  Glucose, capillary     Status: Abnormal    Collection Time: 05/11/21 11:58 AM  Result Value Ref Range    Glucose-Capillary 152 (H) 70 - 99 mg/dL      Comment: Glucose reference range applies only to samples taken after fasting for at least 8 hours.  Glucose, capillary     Status: Abnormal    Collection Time: 05/11/21  4:07 PM  Result Value Ref Range    Glucose-Capillary 211 (H) 70 - 99 mg/dL      Comment: Glucose reference range applies only to samples taken after fasting for at least 8 hours.  Glucose, capillary     Status: Abnormal    Collection Time: 05/11/21 10:13 PM  Result Value Ref Range    Glucose-Capillary 151 (H) 70 - 99 mg/dL      Comment: Glucose reference range applies only to samples taken after fasting for at least 8 hours.  Glucose, capillary     Status: Abnormal    Collection Time: 05/12/21  6:44 AM  Result Value Ref Range    Glucose-Capillary 141 (H) 70 - 99 mg/dL      Comment: Glucose reference range applies only to samples taken after fasting for at least 8 hours.  Glucose, capillary     Status: Abnormal    Collection Time: 05/12/21 11:28 AM  Result Value Ref Range    Glucose-Capillary 241 (H) 70 - 99 mg/dL      Comment: Glucose reference range applies only to samples taken after fasting for at least 8 hours.  Glucose, capillary     Status: Abnormal    Collection Time: 05/12/21  4:26 PM  Result Value Ref Range    Glucose-Capillary 139 (H) 70 - 99 mg/dL      Comment: Glucose reference range applies only to samples taken after fasting for at least 8 hours.  Glucose, capillary     Status: Abnormal    Collection Time: 05/12/21  8:58 PM  Result Value Ref Range    Glucose-Capillary 160 (H) 70 - 99 mg/dL      Comment: Glucose reference range applies only to samples  taken after fasting for at least 8 hours.  Glucose, capillary     Status: Abnormal    Collection Time: 05/13/21  6:29 AM  Result Value Ref Range    Glucose-Capillary 165 (H) 70 - 99 mg/dL      Comment: Glucose reference range applies only to samples taken after fasting for at least 8 hours.      Imaging Results (Last 48 hours)  No results found.           Medical Problem List and Plan: 1. Functional deficits with right side weakness and confusion secondary to infarct  posterior left frontal lobe white matter             -patient may  shower             -ELOS/Goals: 7-10 days mod I 2.  Antithrombotics: -DVT/anticoagulation: Subcutaneous heparin             -antiplatelet therapy: Aspirin 81 mg daily and Plavix 75 mg day x3 weeks then Plavix alone 3. Pain Management: Tylenol as needed 4. Mood: Provide emotional support             -antipsychotic agents: N/A 5. Neuropsych: This patient is capable of making decisions on his own behalf. 6. Skin/Wound Care: Routine skin checks 7. Fluids/Electrolytes/Nutrition: Routine in and outs with follow-up chemistries 8.  Hypertension.  Coreg 12.5 mg nightly and 9.375 mg daily, hydralazine 50 mg every 12 hours 9.  History of CAD with CABG.  Continue Imdur 60 mg twice daily 10.  COVID-19 pneumonia.  Treated with remdesivir and steroid.  Isolation discontinued 12/23 11.  Symptomatic anemia.  Declines blood product due to Jehovah witness..  Continue Aranesp follow-up CBC 12.  Diabetes mellitus.  Hemoglobin A1c 8.5.  SSI.  Patient on Glucophage 500 mg twice daily as well as Amaryl 4 mg daily prior to admission.  Resume as needed 13.  Percutaneous cholecystostomy drain malfunction.  Patient hospitalized [facility] in October for cholecystitis.  Continue Cardura.  Not a surgical candidate status post percutaneous cholecystostomy drain. 14.  Hyperlipidemia.  Zetia 15.  Pseudomonas UTI.  Completing course of IV Fortaz. 16. Constipation- LBM 1 week ago- will give  sorbitol and monitor.    I have personally performed a face to face diagnostic evaluation of this patient and formulated the key components of the plan.  Additionally, I have personally reviewed laboratory data, imaging studies, as well as relevant notes and concur with the physician assistant's documentation above.   The patient's status has not changed from the original H&P.  Any changes in documentation from the acute care chart have been noted above.       Lavon Paganini Angiulli, PA-C 05/13/2021

## 2021-05-13 NOTE — PMR Pre-admission (Incomplete)
PMR Admission Coordinator Pre-Admission Assessment ° °Patient: [name] is an 87 y.o., male °MRN: [id number] °DOB: 04/20/1931 °Height: 6' 1" (185.4 cm) °Weight: 94.6 kg ° °Insurance Information °HMO:     PPO:      PCP:      IPA:      80/20: yes     OTHER:  °PRIMARY:Medicare  A and B     Policy#:  [id number]     Subscriber: Pt. °Phone#: Verified online    Fax#:  °Pre-Cert#:       Employer:  °Benefits:  Phone #:      Name:  °Eff. Date: Parts A ad B effective  01/16/98 Deduct: $1556      Out of Pocket Max:  None      Life Max: N/A  °CIR: 100%      SNF: 100 days °Outpatient: 80%     Co-Pay: 20% °Home Health: 100%      Co-Pay: none °DME: 80%     Co-Pay: 20% °Providers: patient's choice °SECONDARY: [employer]       Policy#: [id number]     Phone#:  ° °Financial Counselor:       Phone#:  ° °The “Data Collection Information Summary” for patients in Inpatient Rehabilitation Facilities with attached “Privacy Act Statement-Health Care Records” was provided and verbally reviewed with: Patient and Family ° °Emergency Contact Information °Contact Information   ° ° Name Relation Home Work Mobile  ° [name],[name] Daughter [phone]  [phone]  ° Rios,Jerry Nephew [phone]    ° °  ° ° °Current Medical History  °Patient Admitting Diagnosis: CVA. COVID, anemia °History of Present Illness: [name]  is a 87-year-old right-handed male with history of AAA, OSA, chronic anemia.  CAD with CABG, CVA, chronic combined CHF, diabetes mellitus, hypertension, hyperlipidemia and recent cholecystostomy tube placement.  Last hospitalized [facility] from 10/29-2011/4 for acute cholecystitis.  He did receive IV iron infusion 10/29 with increase hemoglobin of 6.5-7.2.  Patient was not a surgical candidate due to generalized condition as well as anemia refusal of blood products patient is Jehovah's Witness.  He was treated with Zosyn and percutaneous drain was placed.  Presented 04/29/2021 with increasing cough/fatigue and generalized  weakness.  CT of the abdomen pelvis showed inflammatory changes in gallbladder consistent with cholecystitis.  Indwelling cholecystostomy tube noted.  Colonic diverticulosis without diverticulitis.  Admission chemistries unremarkable except sodium 129 glucose 229,HGb 5.8 urine culture greater 100,000 Pseudomonas, COVID-19 .  Patient was placed on broad-spectrum antibiotics changed to Fortaz.  He was started on low-dose steroids as well as remdesivir completing course.  He completed his course of isolation 12/23.  On 05/05/2021 with increasing confusion.  MRI of the brain showed 3 mm acute infarct within the posterior left frontal lobe white matter.  Known chronic cortical subcortical infarcts within the left frontal and parietal lobes.  Additional known small chronic infarcts within the left occipital lobe posterior left temporal and posterior right frontal lobe.  MRA showed no evidence of intracranial mass.  No midline shift.  Echocardiogram ejection fraction of 45 to 50%.  Left ventricle demonstrated some global hypokinesis.   He continued a chronic indwelling suprapubic catheter last changed 12/21.  Maintained on aspirin and Plavix for CVA prophylaxis x3 weeks then Plavix alone.  He is tolerating a regular consistency diet.  Therapy evaluations completed due to patient decreased functional mobility was admitted for a comprehensive rehab program. °Complete NIHSS TOTAL: 3 ° °Patient's medical record from [facility]    has been reviewed by the rehabilitation admission coordinator and physician. ° °Past Medical History  °Past Medical History:  °Diagnosis Date  ° Abdominal aortic aneurysm   ° a. Aortic duplex 06/2014: mild aneurysmal dilatation of proximal abdominal aorta measuring 3.4x3.4cm. No sig change from 2012. 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 Patent LIMA to LAD. Acutely occluded SVG to PDA within previously placed stent. 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 (03/26/2016)  ° Stenosis of left carotid artery   ° Suprapubic catheter (HCC)   ° Vitamin D deficiency 05/27/2018  ° ° °Has the patient had major surgery during 100 days prior to admission? No ° °Family History   °family history includes Heart Problems (age of onset: 64) in his father. ° °Current Medications ° °Current Facility-Administered Medications:  °  acetaminophen (TYLENOL) tablet 650 mg, 650 mg, Oral, Q6H PRN, Yates, Jennifer, MD, 650 mg at 04/30/21 1037 °  albuterol (VENTOLIN HFA) 108 (90 Base) MCG/ACT inhaler 2 puff, 2 puff, Inhalation, Q2H PRN, Yates, Jennifer, MD °  amLODipine (NORVASC) tablet 10 mg, 10 mg, Oral, Q1500, Yates, Jennifer, MD, 10 mg at 05/12/21 1409 °  ascorbic acid (VITAMIN C) tablet 500 mg, 500 mg, Oral, Daily, Yates,  Jennifer, MD, 500 mg at 05/13/21 0910 °  aspirin chewable tablet 81 mg, 81 mg, Oral, Daily, Reome, Earle J, RPH, 81 mg at 05/13/21 0911 °  bisacodyl (DULCOLAX) EC tablet 5 mg, 5 mg, Oral, Daily PRN, Yates, Jennifer, MD °  carvedilol (COREG) tablet 12.5 mg, 12.5 mg, Oral, QHS, Worley, Jonathan L, RPH, 12.5 mg at 05/11/21 2042 °  carvedilol (COREG) tablet 9.375 mg, 9.375 mg, Oral, Daily, Worley, Jonathan L, RPH, 9.375 mg at 05/13/21 0910 °  cefTAZidime (FORTAZ) 1 g in sodium chloride 0.9 % 100 mL IVPB, 1 g, Intravenous, Q24H, Xu, Fang, MD, Last Rate: 200 mL/hr at 05/12/21 1202, 1 g at 05/12/21 1202 °  chlorpheniramine-HYDROcodone (TUSSIONEX) 10-8 MG/5ML suspension 5 mL, 5 mL, Oral, Q12H PRN, Yates, Jennifer, MD, 5 mL at 05/11/21 2041 °  clopidogrel (PLAVIX) tablet 75 mg, 75 mg, Oral, Daily, Elgergawy, Dawood S, MD, 75 mg at 05/13/21 0910 °  Darbepoetin Alfa (ARANESP) injection 150 mcg, 150 mcg, Subcutaneous, Q Wed-1800, Elgergawy, Dawood S, MD, 150 mcg at 05/07/21   1724 °  doxazosin (CARDURA) tablet 4 mg, 4 mg, Oral, Daily, Yates, Jennifer, MD, 4 mg at 05/13/21 0910 °  ezetimibe (ZETIA) tablet 10 mg, 10 mg, Oral, Daily, Yates, Jennifer, MD, 10 mg at 05/13/21 0910 °  fluticasone (FLONASE) 50 MCG/ACT nasal spray 2 spray, 2 spray, Each Nare, Daily, Xu, Fang, MD, 2 spray at 05/13/21 0913 °  folic acid (FOLVITE) tablet 1 mg, 1 mg, Oral, Daily, Xu, Fang, MD, 1 mg at 05/13/21 0910 °  guaiFENesin (MUCINEX) 12 hr tablet 600 mg, 600 mg, Oral, BID, Xu, Fang, MD, 600 mg at 05/13/21 0911 °  guaiFENesin-dextromethorphan (ROBITUSSIN DM) 100-10 MG/5ML syrup 10 mL, 10 mL, Oral, Q4H PRN, Yates, Jennifer, MD, 10 mL at 05/08/21 2131 °  heparin injection 5,000 Units, 5,000 Units, Subcutaneous, Q8H, Allred, Darrell K, PA-C, 5,000 Units at 05/12/21 2238 °  hydrALAZINE (APRESOLINE) tablet 50 mg, 50 mg, Oral, Q12H, Yates, Jennifer, MD, 50 mg at 05/13/21 0910 °  insulin aspart (novoLOG) injection 0-20 Units, 0-20 Units, Subcutaneous, TID WC,  Elgergawy, Dawood S, MD, 4 Units at 05/13/21 0801 °  insulin aspart (novoLOG) injection 0-5 Units, 0-5 Units, Subcutaneous, QHS, Elgergawy, Dawood S, MD, 2 Units at 05/09/21 2130 °  isosorbide mononitrate (IMDUR) 24 hr tablet 60 mg, 60 mg, Oral, BID, Yates, Jennifer, MD, 60 mg at 05/13/21 0911 °  lidocaine (PF) (XYLOCAINE) 1 % injection, , , PRN, Mugweru, Jon, MD, 10 mL at 05/06/21 1656 °  ondansetron (ZOFRAN) tablet 4 mg, 4 mg, Oral, Q6H PRN **OR** ondansetron (ZOFRAN) injection 4 mg, 4 mg, Intravenous, Q6H PRN, Yates, Jennifer, MD °  pantoprazole (PROTONIX) EC tablet 40 mg, 40 mg, Oral, Daily, Elgergawy, Dawood S, MD, 40 mg at 05/13/21 0911 °  polyethylene glycol (MIRALAX / GLYCOLAX) packet 17 g, 17 g, Oral, Daily, Xu, Fang, MD, 17 g at 05/13/21 0909 °  saccharomyces boulardii (FLORASTOR) capsule 250 mg, 250 mg, Oral, BID, Xu, Fang, MD, 250 mg at 05/13/21 0910 °  senna-docusate (Senokot-S) tablet 1 tablet, 1 tablet, Oral, BID, Xu, Fang, MD, 1 tablet at 05/13/21 0910 °  sodium bicarbonate tablet 650 mg, 650 mg, Oral, BID, Xu, Fang, MD, 650 mg at 05/13/21 0910 °  sodium chloride flush (NS) 0.9 % injection 10 mL, 10 mL, Intracatheter, Q12H, Mugweru, Jon, MD, 10 mL at 05/13/21 0913 °  sodium chloride flush (NS) 0.9 % injection 3 mL, 3 mL, Intravenous, Q12H, Yates, Jennifer, MD, 3 mL at 05/13/21 0914 °  vitamin B-12 (CYANOCOBALAMIN) tablet 1,000 mcg, 1,000 mcg, Oral, Daily, Xu, Fang, MD, 1,000 mcg at 05/13/21 0911 °  zinc sulfate capsule 220 mg, 220 mg, Oral, Daily, Yates, Jennifer, MD, 220 mg at 05/13/21 0910 ° °Patients Current Diet:  °Diet Order   ° °       °  Diet Heart Room service appropriate? Yes; Fluid consistency: Thin  Diet effective now       °  ° °  °  ° °  ° ° °Precautions / Restrictions °Precautions °Precautions: Fall °Precaution Comments: Heparin (pt bleeding from injection site) °Restrictions °Weight Bearing Restrictions: No  ° °Has the patient had 2 or more falls or a fall with injury in the past year?  Yes ° °Prior Activity Level °Limited Community (1-2x/wk): Pt. active in the community PTA ° °Prior Functional Level °Self Care: Did the patient need help bathing, dressing, using the toilet or eating? Independent ° °Indoor Mobility: Did the patient need assistance with walking from room to room (with or without   device)? Independent ° °Stairs: Did the patient need assistance with internal or external stairs (with or without device)? Needed some help ° °Functional Cognition: Did the patient need help planning regular tasks such as shopping or remembering to take medications? Needed some help ° °Patient Information °Are you of Hispanic, Latino/a,or Spanish origin?: A. No, not of Hispanic, Latino/a, or Spanish origin °What is your race?: A. White °Do you need or want an interpreter to communicate with a doctor or health care staff?: 0. No ° °Patient's Response To:  °Health Literacy and Transportation °Is the patient able to respond to health literacy and transportation needs?: Yes °Health Literacy - How often do you need to have someone help you when you read instructions, pamphlets, or other written material from your doctor or pharmacy?: Sometimes °In the past 12 months, has lack of transportation kept you from medical appointments or from getting medications?: No °In the past 12 months, has lack of transportation kept you from meetings, work, or from getting things needed for daily living?: No ° °Home Assistive Devices / Equipment °Home Assistive Devices/Equipment: Walker (specify type) °Home Equipment: Wheelchair - power, Wheelchair - manual, Rolling Walker (2 wheels), Rollator (4 wheels), BSC/3in1, Shower seat ° °Prior Device Use: Indicate devices/aids used by the patient prior to current illness, exacerbation or injury? Walker ° °Current Functional Level °Cognition ° Overall Cognitive Status: Impaired/Different from baseline °Current Attention Level: Sustained °Orientation Level: Oriented to person, Oriented to  place °Following Commands: Follows one step commands consistently °Safety/Judgement: Decreased awareness of safety, Decreased awareness of deficits °General Comments: Pt now A&Ox3, needs repetition to correctly respond to some questions, step by step cues for problem solving °   °Extremity Assessment °(includes Sensation/Coordination) ° Upper Extremity Assessment: RUE deficits/detail °RUE Coordination: decreased fine motor, decreased gross motor  °Lower Extremity Assessment: Defer to PT evaluation  °  °ADLs ° Overall ADL's : Needs assistance/impaired °Eating/Feeding: Supervision/ safety, Independent °Grooming: Wash/dry hands, Wash/dry face, Oral care, Sitting, Minimal assistance °Grooming Details (indicate cue type and reason): performed seated on EOB with assistance needed to load toothbrush °Upper Body Bathing: Min guard, Sitting °Lower Body Bathing: Maximal assistance °Upper Body Dressing : Min guard, Sitting °Lower Body Dressing: Maximal assistance °Toilet Transfer: Minimal assistance, Ambulation, Rolling walker (2 wheels), BSC/3in1, Cueing for safety °Toileting- Clothing Manipulation and Hygiene: Moderate assistance, Sit to/from stand °Functional mobility during ADLs: Minimal assistance, Cueing for safety, Rolling walker (2 wheels) °General ADL Comments: patient had difficulty opening toothpaste and applying to brush.  Used RUE as gross assist  °  °Mobility ° Overal bed mobility: Needs Assistance °Bed Mobility: Supine to Sit °Rolling: Min assist °Supine to sit: Min assist °General bed mobility comments: exiting towards left side of bed  °  °Transfers ° Overall transfer level: Needs assistance °Equipment used: Rolling walker (2 wheels) °Transfers: Sit to/from Stand °Sit to Stand: Mod assist, Max assist °Bed to/from chair/wheelchair/BSC transfer type:: Step pivot °Step pivot transfers: Min assist °General transfer comment: modA to power up to standing on first trial, cues for hand placement and "nose over toes."  Pt requiring maxA on second trial after walk with fatigue  °  °Ambulation / Gait / Stairs / Wheelchair Mobility ° Ambulation/Gait °Ambulation/Gait assistance: Min assist °Gait Distance (Feet): 60 Feet °Assistive device: Rolling walker (2 wheels) °Gait Pattern/deviations: Step-through pattern, Decreased stride length, Trunk flexed °General Gait Details: pt with right lateral lean, requiring minA for balance, cues for sequencing/direction and environmental negotiation. fatigues easily °Gait velocity: reduced °Gait velocity interpretation: <  1.8 ft/sec, indicate of risk for recurrent falls  °  °Posture / Balance Dynamic Sitting Balance °Sitting balance - Comments: required assistance with balance initially due to right lateral leaning but was able to correct with verbal cues °Balance °Overall balance assessment: Needs assistance °Sitting-balance support: No upper extremity supported, Feet supported °Sitting balance-Leahy Scale: Fair °Sitting balance - Comments: required assistance with balance initially due to right lateral leaning but was able to correct with verbal cues °Postural control: Right lateral lean °Standing balance support: Bilateral upper extremity supported °Standing balance-Leahy Scale: Poor °Standing balance comment: reliant on UE support of device  °  °Special needs/care consideration Skin ecchymosis to the BUEs and medial sacrum. and Special service needs Pt. Is Jehovah's Witness and declines blood products. Pt. Has indwelling foley; Diabetic management: Novolog 0-20 units sQ 3x daily with meals and 0-5 units sQ daily at bedtime.   ° °Previous Home Environment (from acute therapy documentation) °Living Arrangements: Alone °Available Help at Discharge: Family, Available 24 hours/day °Type of Home: House °Home Layout: Two level, Able to live on main level with bedroom/bathroom °Alternate Level Stairs-Number of Steps: flight °Home Access: Ramped entrance °Bathroom Shower/Tub: Walk-in shower °Bathroom  Toilet: Handicapped height °Bathroom Accessibility: Yes °Home Care Services: Yes °Type of Home Care Services: Home PT, Home RN °Home Care Agency (if known): [facility] ° °Discharge Living Setting °Plans for Discharge Living Setting: Patient's home °Type of Home at Discharge: House °Discharge Home Layout: Two level, Able to live on main level with bedroom/bathroom °Discharge Home Access: Ramped entrance °Discharge Bathroom Shower/Tub: Walk-in shower °Discharge Bathroom Toilet: Handicapped height °Discharge Bathroom Accessibility: Yes °How Accessible: Accessible via wheelchair, Accessible via walker °Does the patient have any problems obtaining your medications?: No ° °Social/Family/Support Systems °Patient Roles: Other (Comment) °Contact Information: Lynn (daughter) °Anticipated Caregiver: [phone] °Ability/Limitations of Caregiver: Can provide Min A °Caregiver Availability: 24/7 °Discharge Plan Discussed with Primary Caregiver: Yes °Is Caregiver In Agreement with Plan?: Yes °Does Caregiver/Family have Issues with Lodging/Transportation while Pt is in Rehab?: No ° °Goals °Patient/Family Goal for Rehab: PT/OT/SLP Mod I °Expected length of stay: 7-10 days °Pt/Family Agrees to Admission and willing to participate: Yes °Program Orientation Provided & Reviewed with Pt/Caregiver Including Roles  & Responsibilities: Yes ° °Decrease burden of Care through IP rehab admission: Specialzed equipment needs, Decrease number of caregivers, Bowel and bladder program, and Patient/family education ° °Possible need for SNF placement upon discharge: not anticipated ° °Patient Condition: I have reviewed medical records from [facility], spoken with CM, and patient. I met with patient at the bedside for inpatient rehabilitation assessment.  Patient will benefit from ongoing PT, OT, and SLP, can actively participate in 3 hours of therapy a day 5 days of the week, and can make measurable gains during the admission.   Patient will also benefit from the coordinated team approach during an Inpatient Acute Rehabilitation admission.  The patient will receive intensive therapy as well as Rehabilitation physician, nursing, social worker, and care management interventions.  Due to safety, skin/wound care, disease management, medication administration, pain management, and patient education the patient requires 24 hour a day rehabilitation nursing.  The patient is currently min-mod A with mobility and basic ADLs.  Discharge setting and therapy post discharge at home with home health is anticipated.  Patient has agreed to participate in the Acute Inpatient Rehabilitation Program and will admit today. ° °Preadmission Screen Completed By:  Xoey Warmoth B Alanah Sakuma, 05/13/2021 9:22 AM °______________________________________________________________________   °Discussed status with Dr.   Lovorn  on 05/13/21 at   and received approval for admission today. ° °Admission Coordinator:  Reagen Goates B Rylon Poitra, CCC-SLP, time 1010/Date 05/13/21  ° °Assessment/Plan: °Diagnosis: °Does the need for close, 24 hr/day Medical supervision in concert with the patient's rehab needs make it unreasonable for this patient to be served in a less intensive setting? Yes °Co-Morbidities requiring supervision/potential complications: CHF, DM A1c 8.5; on new insulin; SPC (+)- severe anemia- jehova's witness; COVID; new stroke with R hemi and confusion °Due to bladder management, bowel management, safety, skin/wound care, disease management, medication administration, pain management, and patient education, does the patient require 24 hr/day rehab nursing? Yes °Does the patient require coordinated care of a physician, rehab nurse, PT, OT, and SLP to address physical and functional deficits in the context of the above medical diagnosis(es)? Yes °Addressing deficits in the following areas: balance, endurance, locomotion, strength, transferring, bowel/bladder control, bathing, dressing,  feeding, grooming, toileting, cognition, speech, language, and swallowing °Can the patient actively participate in an intensive therapy program of at least 3 hrs of therapy 5 days a week? Yes °The potential for patient to make measurable gains while on inpatient rehab is fair °Anticipated functional outcomes upon discharge from inpatient rehab: modified independent PT, modified independent OT, modified independent SLP °Estimated rehab length of stay to reach the above functional goals is: 7-10 days °Anticipated discharge destination: Home °10. Overall Rehab/Functional Prognosis: good and fair ° ° °MD Signature: ° °

## 2021-05-13 NOTE — Discharge Instructions (Addendum)
Inpatient Rehab Discharge Instructions  [name] Discharge date and time: No discharge date for patient encounter.   Activities/Precautions/ Functional Status: Activity: As tolerated Diet: Regular Wound Care: Routine skin checks./Routine care cholecystostomy drain and follow-up at [facility] Functional status:  ___ No restrictions     ___ Walk up steps independently ___ 24/7 supervision/assistance   ___ Walk up steps with assistance ___ Intermittent supervision/assistance  ___ Bathe/dress independently ___ Walk with walker     __x_ Bathe/dress with assistance ___ Walk Independently    ___ Shower independently ___ Walk with assistance    ___ Shower with assistance ___ No alcohol     ___ Return to work/school ________  COMMUNITY REFERRALS UPON DISCHARGE:    Home Health:   PT     OT     ST    RN                   Agency: [facility]  Phone: [phone]    Medical Equipment/Items Ordered: Bedside Commode, Transfer Bench                                                 Agency/Supplier: [facility] [phone]   Special Instructions: No driving smoking or alcohol   Routine suprapubic catheter care   My questions have been answered and I understand these instructions. I will adhere to these goals and the provided educational materials after my discharge from the hospital.  Patient/Caregiver Signature _______________________________ Date __________  Clinician Signature _______________________________________ Date __________  Please bring this form and your medication list with you to all your follow-up doctor's appointments.  STROKE/TIA DISCHARGE INSTRUCTIONS SMOKING Cigarette smoking nearly doubles your risk of having a stroke & is the single most alterable risk factor  If you smoke or have smoked in the last 12 months, you are advised to quit smoking for your health. Most of the excess cardiovascular risk related to smoking disappears within a year of stopping. Ask you doctor about  anti-smoking medications [place] Quit Line: 1-800-QUIT NOW Free Smoking Cessation Classes [phone]  CHOLESTEROL Know your levels; limit fat & cholesterol in your diet  Lipid Panel     Component Value Date/Time   CHOL 82 05/05/2021 1008   CHOL 138 12/26/2019 1407   TRIG 83 05/05/2021 1008   HDL 34 (L) 05/05/2021 1008   HDL 40 12/26/2019 1407   CHOLHDL 2.4 05/05/2021 1008   VLDL 17 05/05/2021 1008   LDLCALC 31 05/05/2021 1008   [place] 76 12/26/2019 1407     Many patients benefit from treatment even if their cholesterol is at goal. Goal: Total Cholesterol (CHOL) less than 160 Goal:  Triglycerides (TRIG) less than 150 Goal:  HDL greater than 40 Goal:  LDL (LDLCALC) less than 100   BLOOD PRESSURE American Stroke Association blood pressure target is less that 120/80 mm/Hg  Your discharge blood pressure is:  BP: (!) 149/95 Monitor your blood pressure Limit your salt and alcohol intake Many individuals will require more than one medication for high blood pressure  DIABETES (A1c is a blood sugar average for last 3 months) Goal HGBA1c is under 7% (HBGA1c is blood sugar average for last 3 months)  Diabetes:    Lab Results  Component Value Date   HGBA1C 8.5 (H) 05/05/2021    Your HGBA1c can be lowered with medications, healthy  diet, and exercise. Check your blood sugar as directed by your physician Call your physician if you experience unexplained or low blood sugars.  PHYSICAL ACTIVITY/REHABILITATION Goal is 30 minutes at least 4 days per week  Activity: Increase activity slowly, Therapies: Physical Therapy: Home Health Return to work:  Activity decreases your risk of heart attack and stroke and makes your heart stronger.  It helps control your weight and blood pressure; helps you relax and can improve your mood. Participate in a regular exercise program. Talk with your doctor about the best form of exercise for you (dancing, walking, swimming, cycling).  DIET/WEIGHT Goal is to  maintain a healthy weight  Your discharge diet is:  Diet Order             Diet Heart Room service appropriate? Yes; Fluid consistency: Thin  Diet effective now                   liquids Your height is:    Your current weight is:   Your Body Mass Index (BMI) is:    Following the type of diet specifically designed for you will help prevent another stroke. Your goal weight range is:   Your goal Body Mass Index (BMI) is 19-24. Healthy food habits can help reduce 3 risk factors for stroke:  High cholesterol, hypertension, and excess weight.  RESOURCES Stroke/Support Group:  Call [phone]   STROKE EDUCATION PROVIDED/REVIEWED AND GIVEN TO PATIENT Stroke warning signs and symptoms How to activate emergency medical system (call 911). Medications prescribed at discharge. Need for follow-up after discharge. Personal risk factors for stroke. Pneumonia vaccine given: No Flu vaccine given: No My questions have been answered, the writing is legible, and I understand these instructions.  I will adhere to these goals & educational materials that have been provided to me after my discharge from the hospital.

## 2021-05-13 NOTE — Progress Notes (Signed)
Date and time results received: 05/13/21  1920  Test: Hemoglobin Critical Value: 6.1  Name of Provider Notified: Lauraine Rinne, PA  Orders Received? Or Actions Taken?:  No new orders at this times.

## 2021-05-13 NOTE — Progress Notes (Signed)
Patient has been transferred to 5C09 for CIR.  Aurther Loft, RN

## 2021-05-13 NOTE — Progress Notes (Signed)
Inpatient Rehabilitation Admission Medication Review by a Pharmacist  A complete drug regimen review was completed for this patient to identify any potential clinically significant medication issues.  High Risk Drug Classes Is patient taking? Indication by Medication  Antipsychotic No   Anticoagulant Yes Heparin- VTE prophylaxis  Antibiotic Yes Tressie Ellis- UTI- 3 doses left in therapy  Opioid No   Antiplatelet Yes Aspirin/Plavix- CVA prophylaxis X3 weeks followed-by Plavix monotherapy. DAPT ends after last dose on 05/27/2021  Hypoglycemics/insulin Yes iSS- T2DM  Vasoactive Medication Yes Norvasc, coreg, cardura, apresoline, imdur, nitroSL- hypertension, BPH, angina  Chemotherapy No   Other Yes Aranesp- anemia Zetia- HLD Protonix- GERD Sodium bicarb- lab abnormality- acidosis, hyponatremia Folic TGPQ/D-82- anemia     Type of Medication Issue Identified Description of Issue Recommendation(s)  Drug Interaction(s) (clinically significant)     Duplicate Therapy     Allergy     No Medication Administration End Date     Incorrect Dose     Additional Drug Therapy Needed     Significant med changes from prior encounter (inform family/care partners about these prior to discharge).    Other       Clinically significant medication issues were identified that warrant physician communication and completion of prescribed/recommended actions by midnight of the next day:  No  Pharmacist comments:   Time spent performing this drug regimen review (minutes):  30   Makenleigh Crownover BS, PharmD, BCPS Clinical Pharmacist 05/14/2021 7:49 AM

## 2021-05-13 NOTE — Care Management Important Message (Signed)
Important Message  Patient Details  Name: [name] MRN: [id number] Date of Birth: Jan 12, 1931   Medicare Important Message Given:  Yes     Orbie Pyo 05/13/2021, 3:23 PM

## 2021-05-13 NOTE — Discharge Summary (Signed)
Physician Discharge Summary  [name]:[id number] DOB: [birth year] DOA: 04/29/2021  PCP: Suzan Garibaldi, FNP  Admit date: 04/29/2021 Discharge date: 05/13/2021  Admitted From: home w/ family and home health Disposition: inpatient rehab  Recommendations for Follow-up:  Follow up with PCP in 1-2 weeks post CIR d/c Ordered BMP/CBC for AM to monitor    Discharge Condition: stable CODE STATUS DNR Diet recommendation: heart healty  Brief/Interim Summary: [name] is a 87 y.o. male with medical history significant of AAA; OSA; CAD s/p CABG; CVA; chronic combined CHF; DM; HTN; indwelling foley; HLD; Jehovah's witness; and recent cholecystostomy tube placement presenting with cough and generalized weakness.  He was last hospitalized at [facility] from 10/29-11/4 with acute cholecystitis.  Since he was not a surgical candidate due to his generalized conditions as well as anemia with refusal of blood products, he was treated with Zosyn and perc drain placement.  Of note, he previously had choledocholithiasis with biliary sphincterotomy and balloon extraction in 07/2019.  He received IV iron infusion on 10/29 with increase of Hgb from 6.5 to 7.2.  For the week prior to admission, he had cough and worsening fatigue/weakness.  He had been unable to ambulate and his daughter was unable to care for him.  -His work-up in ED was significant for COVID-19 infection, with opacity on imaging, as well with anemia   hemoglobin of 5.8, patient is Jehovah's Witness and does not want any blood product transfusions. -Was started on steroids and remdesivir, no oxygen requirement, steroids were discontinued, he finished his remdesivir with no respiratory symptoms, -12/19 patient had code stroke called as he was noted to be encephalopathic with worsening right-sided weakness.  found to have small acute CVA.   Discharge Diagnoses:  Principal Problem:   COVID-19 Active Problems:   Postoperative atrial fibrillation  (HCC)   Hyperlipidemia associated with type 2 diabetes mellitus (HCC)   CAD S/P percutaneous coronary angioplasty   Diabetes mellitus treated with oral medication (HCC)   AKI (acute kidney injury) ([place])   CKD (chronic kidney disease) stage 3, GFR 30-59 ml/min (HCC)   Suprapubic catheter (HCC)   Anemia of chronic disease   DNR (do not resuscitate)   Cerebral thrombosis with cerebral infarction   Pressure injury of skin   COVID-19 pneumonia - resolved -Treated with remdesivir and steroid -Currently no hypoxia  -out of isolation on 12/23   Acute CVA -12/19 AM patient with increased confusion and lethargy, worsening right-sided weakness, MRI brain significant for acute CVA  -Seen by neurology Dr. Leonie Man "MRI does show tiny punctate left frontal white matter infarct from small vessel disease.  He does have some right hand weakness which appears chronic from nerve injury in the past.  Recommend aspirin Plavix for 3 weeks and then Plavix alone from stroke standpoint.  May continue long-term dual antiplatelet therapy if necessary from cardiac standpoint.  And aggressive risk factor modification" pt has statin intolerance   Symptomatic anemia, declined blood product due to Jehovah witness -Received IV iron and Procrit -Minimize blood draw -No overt sign of external bleed   Percutaneous cholecystectomy drain malfunction -Was hospitalized at [facility] in October for cholecystitis, was deemed not a surgical candidate status post percutaneous cholecystectomy drain -Status post drain placement on 12/20 by IR   Type 2 diabetes, uncontrolled with hyperglycemia and hypoglycemia -A1c 8.5 -Home medication metformin and glipizide held -Is started on insulin here , plan for better blood glucose control ,due to acute stroke     Hypertension -  On multiple blood pressure medication including hydralazine/Imdur, Coreg, Norvasc, Cardura -BP stable currently   CAD status post PCI 2019, CABG 2012 -No chest  pain -Continue home meds   Hyperlipidemia -with history of statin allergy, continue zetia   A. Fib -Rate controlled on Coreg -High risk for anticoagulation in the setting of severe anemia, currently on aspirin/Plavix   Urinary retention with history of BXO -Indwelling suprapubic catheter, catheter changed on 12/21     AKI -renal ultrasound no hydronephrosis -urine culture after suprapubic catheter + pseudomonas Start abx, plan for total of 7 days treatment   Sinus congestion - Flonase and Mucinex   Discharge Instructions Per CIR     Allergies  Allergen Reactions   Atorvastatin Other (See Comments)    Myalgias   Crestor [Rosuvastatin] Other (See Comments)    Myalgias    Other Other (See Comments)    No  BLOOD PRODUCTS - Pt is Jehovah's Witness   Pravastatin Other (See Comments)    Myalgias   Simvastatin Other (See Comments)    Myalgias    Statins Other (See Comments)    Cause muscle aches    Consultations: Phone conversation with urology Dr. Diona Fanti on 12/21 Neurology/stroke team IR   Procedures/Studies: CT ABDOMEN PELVIS WO CONTRAST  Result Date: 04/29/2021 CLINICAL DATA:  [place] last week, weakness, abdominal pain, cholecystostomy tube EXAM: CT ABDOMEN AND PELVIS WITHOUT CONTRAST TECHNIQUE: Multidetector CT imaging of the abdomen and pelvis was performed following the standard protocol without IV contrast. Unenhanced CT was performed per clinician order. Lack of IV contrast limits sensitivity and specificity, especially for evaluation of abdominal/pelvic solid viscera. COMPARISON:  03/10/2018 FINDINGS: Lower chest: There is a small multilocular left pleural effusion. Small free-flowing right pleural effusion is identified. Scattered areas of consolidation at the lung bases, most pronounced in the lower lobes, could reflect bronchopneumonia. Heart is enlarged without pericardial effusion. Hepatobiliary: A percutaneous cholecystostomy tube is identified, coiled  within the gallbladder fundus. Gallbladder remains mildly distended, with gallbladder wall thickening and pericholecystic fluid consistent with cholecystitis. I do not see any calcified gallstones. There is increased soft tissue density within the subcutaneous portions of the cholecystostomy tube, likely inflammatory. No definite fluid collection on this limited unenhanced exam. There is no intrahepatic biliary duct dilation. Unenhanced imaging of the liver is unremarkable. Pancreas: Unremarkable unenhanced appearance. Spleen: Unremarkable unenhanced appearance. Adrenals/Urinary Tract: Bilateral renal cortical atrophy. No urinary tract calculi or obstructive uropathy. The adrenals are unremarkable. Bladder is decompressed with a suprapubic catheter. Stomach/Bowel: No bowel obstruction or ileus. Diffuse colonic diverticulosis without diverticulitis. No bowel wall thickening or inflammatory change. Vascular/Lymphatic: Subcentimeter lymph nodes at the porta hepatis are likely reactive. No pathologic adenopathy. There is extensive atherosclerosis throughout the aorta and its branches unchanged. Reproductive: Stable enlargement the prostate. Other: Trace pelvic free fluid. No free intraperitoneal gas. No abdominal wall hernia. Musculoskeletal: No acute or destructive bony lesions. Reconstructed images demonstrate no additional findings. IMPRESSION: 1. Inflammatory changes of the gallbladder consistent with cholecystitis. Indwelling cholecystostomy tube as above. 2. Soft tissue density along the subcutaneous portion of the cholecystostomy tube, which may be inflammatory. No definite fluid collection is observed on this unenhanced exam. 3. Bibasilar airspace disease and bilateral pleural effusions, consistent with bronchopneumonia. Left pleural effusion is partially loculated. 4. Colonic diverticulosis without diverticulitis. 5. Stable indwelling suprapubic catheter, with decompression of the urinary bladder. 6. Enlarged  prostate. 7.  Aortic Atherosclerosis (ICD10-I70.0). Electronically Signed   By: Randa Ngo M.D.   On: 04/29/2021 15:41  DG Chest 2 View  Result Date: 04/29/2021 CLINICAL DATA:  Cough EXAM: CHEST - 2 VIEW COMPARISON:  Previous studies including the examination of 04/16/2018 due to technical difficulties some of the images in the previous studies are not available for review. FINDINGS: Transverse diameter of heart is increased. Thoracic aorta is tortuous and ectatic. There are linear densities in the lower lung fields suggesting scarring or subsegmental atelectasis. Part of this finding may be due to poor inspiration. There is no focal pulmonary consolidation. There are no signs of alveolar pulmonary edema. There is evidence of previous coronary bypass surgery. Patient's chin is partly obscuring the apices. IMPRESSION: Cardiomegaly. Central pulmonary vessels are prominent without signs of alveolar pulmonary edema. There are linear densities in the lower lung fields suggesting scarring or subsegmental atelectasis. Electronically Signed   By: Elmer Picker M.D.   On: 04/29/2021 13:47   MR ANGIO HEAD WO CONTRAST  Result Date: 05/05/2021 CLINICAL DATA:  Provided history: Neuro deficit, acute, stroke suspected. Cough and generalized weakness/COVID. EXAM: MRI HEAD WITHOUT CONTRAST MRA HEAD WITHOUT CONTRAST TECHNIQUE: Multiplanar, multi-echo pulse sequences of the brain and surrounding structures were acquired without intravenous contrast. Angiographic images of the Circle of Willis were acquired using MRA technique without intravenous contrast. COMPARISON:  Noncontrast head CT 05/05/2021. Report from catheter based angiography 06/06/2018. Report from catheter based angiography 10/26/2018 brain MRI 05/24/2018. CT angiogram head/neck 05/24/2018. FINDINGS: MRI HEAD FINDINGS Brain: Intermittently motion degraded examination, limiting evaluation. Most notably, there is severe motion degradation of the  sagittal T1 weighted sequence and moderate/severe motion degradation of the coronal T2 TSE sequence. Mild-to-moderate generalized cerebral atrophy. 3 mm focus of restricted diffusion within the posterior left frontal lobe white matter, compatible with acute infarction (series 5, image 42) (series 4, image 19). Redemonstrated chronic cortical/subcortical infarcts within the left frontal and parietal lobes. Additional known small chronic infarcts within the left occipital lobe, posterior left temporal lobe and posterior right frontal lobe were better appreciated on the prior brain MRI of 05/24/2018 (acute at that time). Redemonstrated chronic lacunar infarcts within the bilateral cerebral hemispheric white matter. Redemonstrated circumscribed foci of T2 hyperintensity within the bilateral basal ganglia, likely reflecting a combination of prominent perivascular spaces and chronic lacunar infarcts. Background mild multifocal T2 FLAIR hyperintense signal abnormality within the cerebral white matter and pons, nonspecific but compatible with chronic small vessel ischemic disease. Partially empty sella turcica, a nonspecific finding. No evidence of an intracranial mass. No extra-axial fluid collection. No midline shift. Vascular: Reported below. Skull and upper cervical spine: Within the limitations of motion degradation, no focal suspicious marrow lesion is identified. Sinuses/Orbits: Visualized orbits show no acute finding. Bilateral lens replacements. Trace mucosal thickening within the bilateral frontal sinuses. Moderate mucosal thickening within the bilateral ethmoid sinuses. Trace mucosal thickening within the bilateral sphenoid sinuses. Mild mucosal thickening within the bilateral maxillary sinuses. Other: Trace fluid within the bilateral mastoid air cells. MRA HEAD FINDINGS The examination is moderately motion degraded, limiting evaluation. Anterior circulation: The intracranial internal carotid arteries are patent.  There are sites of moderate stenosis within the cavernous/paraclinoid left ICA. The M1 middle cerebral arteries are patent. Redemonstrated known severe stenosis within the distal M1 segment of the left middle cerebral artery. Atherosclerotic irregularity of the M2 and more distal middle cerebral artery vessels, bilaterally. No M2 proximal branch occlusion is identified. The right anterior cerebral artery is patent. Unchanged from the prior catheter based angiogram of 06/06/2018, the left anterior cerebral artery is occluded at the level of  the proximal A2 segment. Within the limitations of motion degradation, no intracranial aneurysm is identified. 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 The intracranial left vertebral artery is patent with no more than mild stenosis. Mild atherosclerotic irregularity of the basilar artery (versus motion artifact). The posterior cerebral arteries are patent. Apparent severe stenosis within the distal P2 segment of the right PCA, not definitively present on prior examinations. Apparent atherosclerotic irregularity of the left PCA. Most notably, there is apparent progressive moderate/severe stenosis within the P1 left PCA. A right posterior communicating artery is present. The left posterior communicating artery is diminutive or absent. Anatomic variants: As described. IMPRESSION: MRI brain: 1. Motion degraded examination, as described and limiting evaluation. 2. 3 mm acute infarct within the posterior left frontal lobe white matter. 3. Known chronic cortical/subcortical infarcts within the left frontal and parietal lobes. Additional known small chronic infarcts within the left occipital lobe, posterior left temporal and posterior right frontal lobe were better appreciated on the prior brain MRI of 05/24/2018 (acute at that time). 4. Redemonstrated chronic lacunar infarcts within the bilateral cerebral hemispheric white matter and basal  ganglia. 5. Background mild chronic small vessel ischemic changes within the cerebral white matter and pons, stable. 6. Mild-to-moderate generalized cerebral atrophy. 7. Paranasal sinus disease, as described. 8. Trace fluid within the bilateral mastoid air cells. MRA head: 1. Moderately motion degraded examination, limiting evaluation. 2. Intracranial atherosclerotic disease, most notably as follows. 3. 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 4. Redemonstrated sites of moderate stenosis within the cavernous/paraclinoid left ICA. 5. Redemonstrated severe stenosis within the distal M1 segment of the left MCA. 6. Apparent severe stenosis within the distal P2 right PCA, not definitely present on prior exams. 7. Progressive apparent moderate/severe stenosis within the P1 left PCA. Electronically Signed   By: Kellie Simmering D.O.   On: 05/05/2021 13:43   MR BRAIN WO CONTRAST  Result Date: 05/05/2021 CLINICAL DATA:  Provided history: Neuro deficit, acute, stroke suspected. Cough and generalized weakness/COVID. EXAM: MRI HEAD WITHOUT CONTRAST MRA HEAD WITHOUT CONTRAST TECHNIQUE: Multiplanar, multi-echo pulse sequences of the brain and surrounding structures were acquired without intravenous contrast. Angiographic images of the Circle of Willis were acquired using MRA technique without intravenous contrast. COMPARISON:  Noncontrast head CT 05/05/2021. Report from catheter based angiography 06/06/2018. Report from catheter based angiography 10/26/2018 brain MRI 05/24/2018. CT angiogram head/neck 05/24/2018. FINDINGS: MRI HEAD FINDINGS Brain: Intermittently motion degraded examination, limiting evaluation. Most notably, there is severe motion degradation of the sagittal T1 weighted sequence and moderate/severe motion degradation of the coronal T2 TSE sequence. Mild-to-moderate generalized cerebral atrophy. 3 mm focus of  restricted diffusion within the posterior left frontal lobe white matter, compatible with acute infarction (series 5, image 42) (series 4, image 19). Redemonstrated chronic cortical/subcortical infarcts within the left frontal and parietal lobes. Additional known small chronic infarcts within the left occipital lobe, posterior left temporal lobe and posterior right frontal lobe were better appreciated on the prior brain MRI of 05/24/2018 (acute at that time). Redemonstrated chronic lacunar infarcts within the bilateral cerebral hemispheric white matter. Redemonstrated circumscribed foci of T2 hyperintensity within the bilateral basal ganglia, likely reflecting a combination of prominent perivascular spaces and chronic lacunar infarcts. Background mild multifocal T2 FLAIR hyperintense signal abnormality within the cerebral white matter and pons, nonspecific but compatible with chronic small vessel ischemic disease. Partially empty sella turcica, a nonspecific  finding. No evidence of an intracranial mass. No extra-axial fluid collection. No midline shift. Vascular: Reported below. Skull and upper cervical spine: Within the limitations of motion degradation, no focal suspicious marrow lesion is identified. Sinuses/Orbits: Visualized orbits show no acute finding. Bilateral lens replacements. Trace mucosal thickening within the bilateral frontal sinuses. Moderate mucosal thickening within the bilateral ethmoid sinuses. Trace mucosal thickening within the bilateral sphenoid sinuses. Mild mucosal thickening within the bilateral maxillary sinuses. Other: Trace fluid within the bilateral mastoid air cells. MRA HEAD FINDINGS The examination is moderately motion degraded, limiting evaluation. Anterior circulation: The intracranial internal carotid arteries are patent. There are sites of moderate stenosis within the cavernous/paraclinoid left ICA. The M1 middle cerebral arteries are patent. Redemonstrated known severe stenosis  within the distal M1 segment of the left middle cerebral artery. Atherosclerotic irregularity of the M2 and more distal middle cerebral artery vessels, bilaterally. No M2 proximal branch occlusion is identified. The right anterior cerebral artery is patent. Unchanged from the prior catheter based angiogram of 06/06/2018, the left anterior cerebral artery is occluded at the level of the proximal A2 segment. Within the limitations of motion degradation, no intracranial aneurysm is identified. 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 The intracranial left vertebral artery is patent with no more than mild stenosis. Mild atherosclerotic irregularity of the basilar artery (versus motion artifact). The posterior cerebral arteries are patent. Apparent severe stenosis within the distal P2 segment of the right PCA, not definitively present on prior examinations. Apparent atherosclerotic irregularity of the left PCA. Most notably, there is apparent progressive moderate/severe stenosis within the P1 left PCA. A right posterior communicating artery is present. The left posterior communicating artery is diminutive or absent. Anatomic variants: As described. IMPRESSION: MRI brain: 1. Motion degraded examination, as described and limiting evaluation. 2. 3 mm acute infarct within the posterior left frontal lobe white matter. 3. Known chronic cortical/subcortical infarcts within the left frontal and parietal lobes. Additional known small chronic infarcts within the left occipital lobe, posterior left temporal and posterior right frontal lobe were better appreciated on the prior brain MRI of 05/24/2018 (acute at that time). 4. Redemonstrated chronic lacunar infarcts within the bilateral cerebral hemispheric white matter and basal ganglia. 5. Background mild chronic small vessel ischemic changes within the cerebral white matter and pons, stable. 6. Mild-to-moderate generalized cerebral  atrophy. 7. Paranasal sinus disease, as described. 8. Trace fluid within the bilateral mastoid air cells. MRA head: 1. Moderately motion degraded examination, limiting evaluation. 2. Intracranial atherosclerotic disease, most notably as follows. 3. 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 4. Redemonstrated sites of moderate stenosis within the cavernous/paraclinoid left ICA. 5. Redemonstrated severe stenosis within the distal M1 segment of the left MCA. 6. Apparent severe stenosis within the distal P2 right PCA, not definitely present on prior exams. 7. Progressive apparent moderate/severe stenosis within the P1 left PCA. Electronically Signed   By: Kellie Simmering D.O.   On: 05/05/2021 13:43   US RENAL  Result Date: 05/07/2021 CLINICAL DATA:  Increased creatinine. EXAM: RENAL / URINARY TRACT ULTRASOUND COMPLETE COMPARISON:  CT abdomen and pelvis 04/29/2021 FINDINGS: Right Kidney: Renal measurements: 9.5 x 4.7 x 5.3 cm = volume: 128 mL. Echogenicity is increased. No mass or hydronephrosis visualized. Left Kidney: Renal measurements: 11.4 x 4.9 x 4.5 cm = volume: 130 mL. Echogenicity is increased. No mass or hydronephrosis visualized. Bladder: Not well  seen. Other: None. IMPRESSION: 1. Echogenic kidneys likely related to medical renal disease. 2. No hydronephrosis. 3. Bladder not well seen. Electronically Signed   By: Ronney Asters M.D.   On: 05/07/2021 20:46   DG Abd Portable 1V  Result Date: 05/05/2021 CLINICAL DATA:  Check biliary drainage catheter EXAM: PORTABLE ABDOMEN - 1 VIEW COMPARISON:  04/29/2021 FINDINGS: Scattered large and small bowel gas is noted. Drainage catheter is noted in the right upper quadrant which is shown to be a percutaneous cholecystostomy tube on recent CT although multiple proximal side holes are noted which are likely within the substance of the liver and extrinsic to the liver  margin given its positioning. No free air is seen. No bony abnormality is noted. IMPRESSION: No obstructive changes are seen. Catheter in the right upper quadrant which is shown to be a percutaneous cholecystostomy catheter on recent CT. It demonstrates multiple proximal side holes which are extrinsic to the liver margin. IR consultation and possible evaluation of the catheter on 05/06/2021 is recommended. Electronically Signed   By: Inez Catalina M.D.   On: 05/05/2021 20:49   IR EXCHANGE BILIARY DRAIN  Result Date: 05/06/2021 INDICATION: History of acute cholecystitis, post ultrasound fluoroscopic guided cholecystostomy tube placement on 03/20/2021 at [facility] Reported pericatheter leaking and inadequate drainage. EXAM: FLUOROSCOPIC GUIDED CHOLECYSTOSTOMY TUBE EXCHANGE COMPARISON:  CT AP, 04/29/2021. MEDICATIONS: None ANESTHESIA/SEDATION: None CONTRAST:  75mL OMNIPAQUE IOHEXOL 300 MG/ML SOLN - administered into the gallbladder lumen. FLUOROSCOPY TIME:  1 minute 30 seconds.  6 mGy. COMPLICATIONS: None immediate. PROCEDURE: The patient was positioned supine on the fluoroscopy table. The external portion of the existing cholecystostomy tube as well as the surrounding skin was prepped and draped in usual sterile fashion. A time-out was performed prior to the initiation of the procedure. A preprocedural spot fluoroscopic image was obtained of the right upper abdominal quadrant existing cholecystostomy tube. The skin surrounding the cholecystostomy tube was anesthetized with 1% lidocaine with epinephrine. The external portion of the cholecystostomy tube was cut and cannulated with a short Amplatz wire which was advanced through the tube and coiled within the gallbladder lumen Next, under intermittent fluoroscopic guidance, the existing 8 Fr cholecystostomy tube was exchanged for a new, slightly larger now 10 Fr cholecystostomy tube which was repositioned into the more central aspect of the gallbladder lumen.  Contrast injection confirms appropriate positioning and functionality of the cholecystomy tube. The cholecystostomy tube was flushed with a small amount of saline and reconnected to a gravity bag. The cholecystostomy tube was secured with an interrupted suture and a Stat Lock device. A dressing was applied. The patient tolerated the procedure well without immediate postprocedural complication. FINDINGS: Preprocedural spot fluoroscopic image demonstrates unchanged positioning of cholecystostomy tube with end coiled and locked over the expected location of the fundus of the gallbladder After fluoroscopic guided exchange, the new, slightly larger, now 10 Fr cholecystostomy tube is more ideally positioned with end coiled and locked within the central aspect of the gallbladder lumen. Post exchange cholangiogram demonstrates appropriate positioning and functionality of the new cholecystostomy tube. IMPRESSION: Successful fluoroscopic guided exchange, repositioning and up sizing of now 10 Fr cholecystostomy tube. PLAN: - The patient's cholecystostomy tube was reconnected to a gravity bag. - The patient may return to the interventional radiology drain clinic in 6 weeks for repeat diagnostic cholangiogram. The patient knows to call the interventional radiology drain clinic [phone]) with any interval questions or concerns. Michaelle Birks, MD [facility] Radiology Electronically  Signed   By: Michaelle Birks M.D.   On: 05/06/2021 17:28   CT HEAD CODE STROKE WO CONTRAST  Result Date: 05/05/2021 CLINICAL DATA:  Code stroke. Neuro deficit, acute, stroke suspected. Right-sided weakness. EXAM: CT HEAD WITHOUT CONTRAST TECHNIQUE: Contiguous axial images were obtained from the base of the skull through the vertex without intravenous contrast. COMPARISON:  05/24/2018 FINDINGS: Brain: No focal abnormality affects the brainstem or cerebellum. Both cerebral hemispheres show chronic  small-vessel ischemic changes of the white matter. 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 No sign of acute infarction, mass lesion, hemorrhage, hydrocephalus or extra-axial collection. Vascular: There is atherosclerotic calcification of the major vessels at the base of the brain. Skull: Negative Sinuses/Orbits: Clear/normal Other: None ASPECTS ([place] Stroke Program Early CT Score) - Ganglionic level infarction (caudate, lentiform nuclei, internal capsule, insula, M1-M3 cortex): 7 - Supraganglionic infarction (M4-M6 cortex): 3, allowing for the old left parietal infarction. Total score (0-10 with 10 being normal): 10, allowing for the old left parietal infarction. IMPRESSION: 1. No acute CT finding. 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 2. ASPECTS is 10, allowing for the old left parietal infarction. 3. These results were communicated to Dr. Leonie Man at 10:03 am on 05/05/2021 by text page via the [facility] messaging system. Electronically Signed   By: Nelson Chimes M.D.   On: 05/05/2021 10:06   VAS US CAROTID  Result Date: 05/08/2021 Carotid Arterial Duplex Study Patient Name:  [name]  Date of Exam:   05/06/2021 Medical Rec #: [id number]       Accession #:    [id number] Date of Birth: 03-07-1931       Patient Gender: M Patient Age:   77 years Exam Location:  [facility] Procedure:      VAS US CAROTID Referring Phys: PRAMOD SETHI --------------------------------------------------------------------------------  Indications:  TIA. Risk Factors: Hypertension, Diabetes. Limitations   Today's exam was limited due to patient movement. Performing Technologist: Archie Patten RVS  Examination Guidelines: A complete evaluation includes B-mode imaging, spectral Doppler, color Doppler, and power Doppler as needed of all accessible portions of each vessel. Bilateral testing is considered an integral part of a  complete examination. Limited examinations for reoccurring indications may be performed as noted.  Right Carotid Findings: +----------+--------+--------+--------+------------------+--------+             PSV cm/s EDV cm/s Stenosis Plaque Description Comments  +----------+--------+--------+--------+------------------+--------+  CCA Prox   68       11                heterogenous                 +----------+--------+--------+--------+------------------+--------+  CCA Distal 57       15                heterogenous                 +----------+--------+--------+--------+------------------+--------+  ICA Prox   80       24       1-39%    heterogenous                 +----------+--------+--------+--------+------------------+--------+  ICA Distal 48       17                                             +----------+--------+--------+--------+------------------+--------+  ECA        72                                                      +----------+--------+--------+--------+------------------+--------+ +----------+--------+-------+--------+-------------------+             PSV cm/s EDV cms Describe Arm Pressure (mmHG)  +----------+--------+-------+--------+-------------------+  Subclavian 86                                             +----------+--------+-------+--------+-------------------+ +---------+--------+--+--------+--+---------+  Vertebral PSV cm/s 49 EDV cm/s 15 Antegrade  +---------+--------+--+--------+--+---------+  Left Carotid Findings: +----------+--------+--------+--------+------------------+--------+             PSV cm/s EDV cm/s Stenosis Plaque Description Comments  +----------+--------+--------+--------+------------------+--------+  CCA Prox   54       8                 heterogenous                 +----------+--------+--------+--------+------------------+--------+  CCA Distal 51       11                heterogenous                 +----------+--------+--------+--------+------------------+--------+  ICA Prox    47       16       1-39%    heterogenous                 +----------+--------+--------+--------+------------------+--------+  ICA Distal 35       16                                             +----------+--------+--------+--------+------------------+--------+  ECA        89       6                                              +----------+--------+--------+--------+------------------+--------+ +----------+--------+--------+--------+-------------------+             PSV cm/s EDV cm/s Describe Arm Pressure (mmHG)  +----------+--------+--------+--------+-------------------+  Subclavian 88                                              +----------+--------+--------+--------+-------------------+ +---------+--------+--+--------+--+---------+  Vertebral PSV cm/s 86 EDV cm/s 28 Antegrade  +---------+--------+--+--------+--+---------+   Summary: Right Carotid: Velocities in the right ICA are consistent with a 1-39% stenosis. Left Carotid: Velocities in the left ICA are consistent with a 1-39% stenosis. Vertebrals: Bilateral vertebral arteries demonstrate antegrade flow. *See table(s) above for measurements and observations.  Electronically signed by Antony Contras MD on 05/08/2021 at 12:42:41 PM.    Final    ECHOCARDIOGRAM LIMITED  Result Date: 05/05/2021    ECHOCARDIOGRAM LIMITED REPORT   Patient Name:   [name] Date of Exam: 05/05/2021 Medical Rec #:  [id number]      Height:       73.0 in Accession #:    [id number]     Weight:       196.0 lb Date of Birth:  02-Oct-1930      BSA:          2.133 m Patient Age:    29 years       BP:           131/71 mmHg Patient Gender: M              HR:           72 bpm. Exam Location:  Inpatient Procedure: 3D Echo, Limited Echo, Cardiac Doppler and Color Doppler Indications:    TIA  History:        Patient has prior history of Echocardiogram examinations, most                 recent 05/25/2018. CAD and Previous Myocardial Infarction,                 Abnormal ECG and Prior CABG, Stroke,  Arrythmias:Bradycardia and                 Atrial Fibrillation; Risk Factors:Diabetes, Hypertension,                 Dyslipidemia and Sleep Apnea. Covid positive.  Sonographer:    Roseanna Rainbow RDCS Referring Phys: [place]  1. Left ventricular ejection fraction, by estimation, is 45 to 50%. The left ventricle has mildly decreased function. The left ventricle demonstrates global hypokinesis. There is severe asymmetric left ventricular hypertrophy of the basal-septal segment.  2. Right ventricular systolic function is mildly reduced. The right ventricular size is normal. There is mildly elevated pulmonary artery systolic pressure. The estimated right ventricular systolic pressure is 01.7 mmHg.  3. The mitral valve is normal in structure. Trivial mitral valve regurgitation. No evidence of mitral stenosis.  4. The aortic valve was not well visualized. Aortic valve regurgitation is not visualized. No aortic stenosis is present.  5. The inferior vena cava is dilated in size with <50% respiratory variability, suggesting right atrial pressure of 15 mmHg.  6. Aortic dilatation noted. 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 FINDINGS  Left Ventricle: Left ventricular ejection fraction, by estimation, is 45 to 50%. The left ventricle has mildly decreased function. The left ventricle demonstrates global hypokinesis. The left ventricular internal cavity size was normal in size. There is  severe asymmetric left ventricular hypertrophy of the basal-septal segment. Right Ventricle: The right ventricular size is normal. Right ventricular systolic function is mildly reduced. There is mildly elevated pulmonary artery systolic pressure. The tricuspid regurgitant velocity is 2.62 m/s, and with an assumed right atrial pressure of 15 mmHg, the estimated right ventricular systolic pressure is 79.3 mmHg. Pericardium: There is no evidence of  pericardial effusion. Mitral Valve: The mitral valve is normal in structure. Trivial mitral valve regurgitation. No evidence of mitral valve stenosis. Tricuspid Valve: The tricuspid valve is normal in structure. Tricuspid valve regurgitation is mild. Aortic Valve: The aortic valve was not well visualized. Aortic valve regurgitation is not visualized. No aortic stenosis is present. Aorta: Aortic dilatation noted. There is mild dilatation of the aortic root, measuring 40 mm. There is an aneurysm involving the ascending aorta measuring 49 mm. Venous: The inferior vena cava is dilated in size with  less than 50% respiratory variability, suggesting right atrial pressure of 15 mmHg. IAS/Shunts: The interatrial septum was not well visualized. LEFT VENTRICLE PLAX 2D LVIDd:         5.10 cm LVIDs:         4.00 cm LV PW:         1.30 cm LV IVS:        1.70 cm LVOT diam:     2.30 cm      3D Volume EF: LVOT Area:     4.15 cm     3D EF:        62 %                             LV EDV:       168 ml                             LV ESV:       65 ml LV Volumes (MOD)            LV SV:        104 ml LV vol d, MOD A2C: 163.0 ml LV vol d, MOD A4C: 89.9 ml LV vol s, MOD A2C: 79.8 ml LV vol s, MOD A4C: 39.4 ml LV SV MOD A2C:     83.2 ml LV SV MOD A4C:     89.9 ml LV SV MOD BP:      64.5 ml IVC IVC diam: 2.40 cm LEFT ATRIUM         Index LA diam:    3.40 cm 1.59 cm/m   AORTA Ao Root diam: 4.00 cm Ao Asc diam:  4.90 cm TRICUSPID VALVE TR Peak grad:   27.5 mmHg TR Vmax:        262.00 cm/s  SHUNTS Systemic Diam: 2.30 cm Oswaldo Milian MD Electronically signed by Oswaldo Milian MD Signature Date/Time: 05/05/2021/3:52:38 PM    Final    (Echo, Carotid, EGD, Colonoscopy, ERCP)    Subjective:   Discharge Exam: Vitals:   05/13/21 0454 05/13/21 0901  BP: (!) 161/75 126/60  Pulse: 71 88  Resp: 17 16  Temp: (!) 97.5 F (36.4 C) (!) 97.5 F (36.4 C)  SpO2: 94% 95%   Vitals:   05/12/21 1632 05/12/21 2058 05/13/21 0454  05/13/21 0901  BP: 131/62 (!) 153/63 (!) 161/75 126/60  Pulse: 86 (!) 55 71 88  Resp: 20 18 17 16   Temp: 97.6 F (36.4 C) 97.7 F (36.5 C) (!) 97.5 F (36.4 C) (!) 97.5 F (36.4 C)  TempSrc:   Oral Oral  SpO2: 97% 94% 94% 95%  Weight:      Height:        General: Pt is alert, awake, not in acute distress Cardiovascular: RRR, S1/S2 +, no rubs, no gallops Respiratory: CTA bilaterally, no wheezing, no rhonchi Abdominal: Soft, NT, ND, bowel sounds + Extremities: no edema, no cyanosis    The results of significant diagnostics from this hospitalization (including imaging, microbiology, ancillary and laboratory) are listed below for reference.     Microbiology: Recent Results (from the past 240 hour(s))  Urine Culture     Status: Abnormal   Collection Time: 05/08/21  9:09 AM   Specimen: Urine, Suprapubic  Result Value Ref Range Status   Specimen Description URINE, SUPRAPUBIC  Final   Special Requests   Final  NONE Performed at [facility], [place] [address], [place], [place] [address]    Culture >=100,000 COLONIES/mL PSEUDOMONAS AERUGINOSA (A)  Final   Report Status 05/10/2021 FINAL  Final   Organism ID, Bacteria PSEUDOMONAS AERUGINOSA (A)  Final      Susceptibility   Pseudomonas aeruginosa - MIC*    CEFTAZIDIME 4 SENSITIVE Sensitive     CIPROFLOXACIN <=0.25 SENSITIVE Sensitive     GENTAMICIN 4 SENSITIVE Sensitive     IMIPENEM 2 SENSITIVE Sensitive     CEFEPIME 4 SENSITIVE Sensitive     * >=100,000 COLONIES/mL PSEUDOMONAS AERUGINOSA     Labs: BNP (last 3 results) No results for input(s): BNP in the last 8760 hours. Basic Metabolic Panel: Recent Labs  Lab 05/08/21 0400  NA 139  K 4.2  CL 114*  CO2 19*  GLUCOSE 170*  BUN 44*  CREATININE 2.43*  CALCIUM 7.6*   Liver Function Tests: No results for input(s): AST, ALT, ALKPHOS, BILITOT, PROT, ALBUMIN in the last 168 hours. No results for input(s): LIPASE, AMYLASE in the last 168 hours. No results for  input(s): AMMONIA in the last 168 hours. CBC: Recent Labs  Lab 05/08/21 0400  WBC 5.5  HGB 6.2*  HCT 18.1*  MCV 95.8  PLT 423*   Cardiac Enzymes: No results for input(s): CKTOTAL, CKMB, CKMBINDEX, TROPONINI in the last 168 hours. BNP: Invalid input(s): POCBNP CBG: Recent Labs  Lab 05/12/21 1626 05/12/21 2058 05/13/21 0629 05/13/21 0838 05/13/21 1104  GLUCAP 139* 160* 165* 281* 295*   D-Dimer No results for input(s): DDIMER in the last 72 hours. Hgb A1c No results for input(s): HGBA1C in the last 72 hours. Lipid Profile No results for input(s): CHOL, HDL, LDLCALC, TRIG, CHOLHDL, LDLDIRECT in the last 72 hours. Thyroid function studies No results for input(s): TSH, T4TOTAL, T3FREE, THYROIDAB in the last 72 hours.  Invalid input(s): FREET3 Anemia work up No results for input(s): VITAMINB12, FOLATE, FERRITIN, TIBC, IRON, RETICCTPCT in the last 72 hours. Urinalysis    Component Value Date/Time   COLORURINE YELLOW 05/08/2021 0909   APPEARANCEUR CLOUDY (A) 05/08/2021 0909   LABSPEC 1.014 05/08/2021 0909   PHURINE 5.0 05/08/2021 0909   GLUCOSEU NEGATIVE 05/08/2021 0909   HGBUR SMALL (A) 05/08/2021 0909   BILIRUBINUR NEGATIVE 05/08/2021 0909   KETONESUR 5 (A) 05/08/2021 0909   PROTEINUR 100 (A) 05/08/2021 0909   UROBILINOGEN 0.2 01/22/2015 1031   NITRITE NEGATIVE 05/08/2021 0909   LEUKOCYTESUR LARGE (A) 05/08/2021 0909   Sepsis Labs Invalid input(s): PROCALCITONIN,  WBC,  LACTICIDVEN Microbiology Recent Results (from the past 240 hour(s))  Urine Culture     Status: Abnormal   Collection Time: 05/08/21  9:09 AM   Specimen: Urine, Suprapubic  Result Value Ref Range Status   Specimen Description URINE, SUPRAPUBIC  Final   Special Requests   Final    NONE Performed at [facility], [address] [place], [place] [address]    Culture >=100,000 COLONIES/mL PSEUDOMONAS AERUGINOSA (A)  Final   Report Status 05/10/2021 FINAL  Final   Organism ID, Bacteria  PSEUDOMONAS AERUGINOSA (A)  Final      Susceptibility   Pseudomonas aeruginosa - MIC*    CEFTAZIDIME 4 SENSITIVE Sensitive     CIPROFLOXACIN <=0.25 SENSITIVE Sensitive     GENTAMICIN 4 SENSITIVE Sensitive     IMIPENEM 2 SENSITIVE Sensitive     CEFEPIME 4 SENSITIVE Sensitive     * >=100,000 COLONIES/mL PSEUDOMONAS AERUGINOSA     Time  coordinating discharge: Over 30 minutes  SIGNED:   Emeterio Reeve, MD  [facility] 05/13/2021, 4:29 PM Pager   If 7PM-7AM, please contact night-coverage [email or web address] Password [email or web address]

## 2021-05-13 NOTE — PMR Pre-admission (Signed)
PMR Admission Coordinator Pre-Admission Assessment  Patient: [name] is an 87 y.o., male MRN: [id number] DOB: 1930-10-31 Height: 6' 1" (185.4 cm) Weight: 94.6 kg  Insurance Information HMO:     PPO:      PCP:      IPA:      80/20: yes     OTHER:  PRIMARY:Medicare  A and B     Policy#:  [id number]     Subscriber: Pt. Phone#: Verified online    Fax#:  Pre-Cert#:       Employer:  Benefits:  Phone #:      Name:  Eff. Date: Parts A ad B effective  01/16/98 Deduct: $1556      Out of Pocket Max:  None      Life Max: N/A  CIR: 100%      SNF: 100 days Outpatient: 80%     Co-Pay: 20% Home Health: 100%      Co-Pay: none DME: 80%     Co-Pay: 20% Providers: patient's choice SECONDARY: A[employer]       Policy#: [id number]     Phone#:   Financial Counselor:       Phone#:   The Data Collection Information Summary for patients in Inpatient Rehabilitation Facilities with attached Privacy Act [place] Records was provided and verbally reviewed with: Patient and Family  Emergency Contact Information Contact Information     Name Relation Home Work Mobile   [name],[name] Daughter [phone]  [phone]   [name] [phone]         Current Medical History  Patient Admitting Diagnosis: CVA. COVID, anemia History of Present Illness: [name]  is a 87 year old right-handed male with history of AAA, OSA, chronic anemia.  CAD with CABG, CVA, chronic combined CHF, diabetes mellitus, hypertension, hyperlipidemia and recent cholecystostomy tube placement.  Last hospitalized [facility] from 10/29-2011/4 for acute cholecystitis.  He did receive IV iron infusion 10/29 with increase hemoglobin of 6.5-7.2.  Patient was not a surgical candidate due to generalized condition as well as anemia refusal of blood products patient is Jehovah's Witness.  He was treated with Zosyn and percutaneous drain was placed.  Presented 04/29/2021 with increasing cough/fatigue and generalized  weakness.  CT of the abdomen pelvis showed inflammatory changes in gallbladder consistent with cholecystitis.  Indwelling cholecystostomy tube noted.  Colonic diverticulosis without diverticulitis.  Admission chemistries unremarkable except sodium 129 glucose 229,HGb 5.8 urine culture greater 100,000 Pseudomonas, COVID-19 .  Patient was placed on broad-spectrum antibiotics changed to [place].  He was started on low-dose steroids as well as remdesivir completing course.  He completed his course of isolation 12/23.  On 05/05/2021 with increasing confusion.  MRI of the brain showed 3 mm acute infarct within the posterior left frontal lobe white matter.  Known chronic cortical subcortical infarcts within the left frontal and parietal lobes.  Additional known small chronic infarcts within the left occipital lobe posterior left temporal and posterior right frontal lobe.  MRA showed no evidence of intracranial mass.  No midline shift.  Echocardiogram ejection fraction of 45 to 50%.  Left ventricle demonstrated some global hypokinesis.   He continued a chronic indwelling suprapubic catheter last changed 12/21.  Maintained on aspirin and Plavix for CVA prophylaxis x3 weeks then Plavix alone.  He is tolerating a regular consistency diet.  Therapy evaluations completed due to patient decreased functional mobility was admitted for a comprehensive rehab program. Complete NIHSS TOTAL: 3  Patient's medical record from [facility]  has been reviewed by the rehabilitation admission coordinator and physician.  Past Medical History  Past Medical History:  Diagnosis Date   Abdominal aortic aneurysm    a. Aortic duplex 06/2014: mild aneurysmal dilatation of proximal abdominal aorta measuring 3.4x3.4cm. No sig change from 2012. 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 Patent LIMA to LAD. Acutely occluded SVG to PDA within previously placed stent. 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 (03/26/2016)   Stenosis of left carotid artery    Suprapubic catheter ([place])    Vitamin D deficiency 05/27/2018    Has the patient had major surgery during 100 days prior to admission? No  Family History   family history includes Heart Problems (age of onset: 57) in his father.  Current Medications  Current Facility-Administered Medications:    acetaminophen (TYLENOL) tablet 650 mg, 650 mg, Oral, Q6H PRN, Karmen Bongo, MD, 650 mg at 04/30/21 1037   albuterol (VENTOLIN HFA) 108 (90 Base) MCG/ACT inhaler 2 puff, 2 puff, Inhalation, Q2H PRN, Karmen Bongo, MD   amLODipine (NORVASC) tablet 10 mg, 10 mg, Oral, Q1500, Karmen Bongo, MD, 10 mg at 05/12/21 1409   ascorbic acid (VITAMIN C) tablet 500 mg, 500 mg, Oral, Daily, Karmen Bongo, MD, 500 mg at 05/13/21 2035   aspirin chewable tablet 81 mg, 81 mg, Oral, Daily, Reome, Earle J, RPH, 81 mg at 05/13/21 0911   bisacodyl (DULCOLAX) EC tablet 5 mg, 5 mg, Oral, Daily PRN, Karmen Bongo, MD   carvedilol (COREG) tablet 12.5 mg, 12.5 mg, Oral, QHS, Heloise Purpura, RPH, 12.5 mg at 05/11/21 2042   carvedilol (COREG) tablet 9.375 mg, 9.375 mg, Oral, Daily, Heloise Purpura, RPH, 9.375 mg at 05/13/21 0910   cefTAZidime (FORTAZ) 1 g in sodium chloride 0.9 % 100 mL IVPB, 1 g, Intravenous, Q24H, Florencia Reasons, MD, Last Rate: 200 mL/hr at 05/12/21 1202, 1 g at 05/12/21 1202   chlorpheniramine-HYDROcodone (TUSSIONEX) 10-8 MG/5ML suspension 5 mL, 5 mL, Oral, Q12H PRN, Karmen Bongo, MD, 5 mL at 05/11/21 2041   clopidogrel (PLAVIX) tablet 75 mg, 75 mg, Oral, Daily, Elgergawy, Silver Huguenin, MD, 75 mg at 05/13/21 0910   Darbepoetin Alfa (ARANESP) injection 150 mcg, 150 mcg, Subcutaneous, Q Wed-1800, Elgergawy, Silver Huguenin, MD, 150 mcg at 05/07/21  1724   doxazosin (CARDURA) tablet 4 mg, 4 mg, Oral, Daily, Karmen Bongo, MD, 4 mg at 05/13/21 0910   ezetimibe (ZETIA) tablet 10 mg, 10 mg, Oral, Daily, Karmen Bongo, MD, 10 mg at 05/13/21 0910   fluticasone (FLONASE) 50 MCG/ACT nasal spray 2 spray, 2 spray, Each Nare, Daily, Florencia Reasons, MD, 2 spray at 38/45/36 4680   folic acid (FOLVITE) tablet 1 mg, 1 mg, Oral, Daily, Florencia Reasons, MD, 1 mg at 05/13/21 0910   guaiFENesin (MUCINEX) 12 hr tablet 600 mg, 600 mg, Oral, BID, Florencia Reasons, MD, 600 mg at 05/13/21 0911   guaiFENesin-dextromethorphan (ROBITUSSIN DM) 100-10 MG/5ML syrup 10 mL, 10 mL, Oral, Q4H PRN, Karmen Bongo, MD, 10 mL at 05/08/21 2131   heparin injection 5,000 Units, 5,000 Units, Subcutaneous, Q8H, Allred, Darrell K, PA-C, 5,000 Units at 05/12/21 2238   hydrALAZINE (APRESOLINE) tablet 50 mg, 50 mg, Oral, Q12H, Karmen Bongo, MD, 50 mg at 05/13/21 0910   insulin aspart (novoLOG) injection 0-20 Units, 0-20 Units, Subcutaneous, TID WC,  Elgergawy, Silver Huguenin, MD, 4 Units at 05/13/21 0801   insulin aspart (novoLOG) injection 0-5 Units, 0-5 Units, Subcutaneous, QHS, Elgergawy, Silver Huguenin, MD, 2 Units at 05/09/21 2130   isosorbide mononitrate (IMDUR) 24 hr tablet 60 mg, 60 mg, Oral, BID, Karmen Bongo, MD, 60 mg at 05/13/21 0911   lidocaine (PF) (XYLOCAINE) 1 % injection, , , PRN, Mugweru, Jon, MD, 10 mL at 05/06/21 1656   ondansetron (ZOFRAN) tablet 4 mg, 4 mg, Oral, Q6H PRN **OR** ondansetron (ZOFRAN) injection 4 mg, 4 mg, Intravenous, Q6H PRN, Karmen Bongo, MD   pantoprazole (PROTONIX) EC tablet 40 mg, 40 mg, Oral, Daily, Elgergawy, Silver Huguenin, MD, 40 mg at 05/13/21 0911   polyethylene glycol (MIRALAX / GLYCOLAX) packet 17 g, 17 g, Oral, Daily, Florencia Reasons, MD, 17 g at 05/13/21 3212   saccharomyces boulardii (FLORASTOR) capsule 250 mg, 250 mg, Oral, BID, Florencia Reasons, MD, 250 mg at 05/13/21 2482   senna-docusate (Senokot-S) tablet 1 tablet, 1 tablet, Oral, BID, Florencia Reasons, MD, 1 tablet at 05/13/21 0910   sodium bicarbonate tablet 650 mg, 650 mg, Oral, BID, Florencia Reasons, MD, 650 mg at 05/13/21 0910   sodium chloride flush (NS) 0.9 % injection 10 mL, 10 mL, Intracatheter, Q12H, Mugweru, Jon, MD, 10 mL at 05/13/21 0913   sodium chloride flush (NS) 0.9 % injection 3 mL, 3 mL, Intravenous, Q12H, Karmen Bongo, MD, 3 mL at 05/13/21 0914   vitamin B-12 (CYANOCOBALAMIN) tablet 1,000 mcg, 1,000 mcg, Oral, Daily, Florencia Reasons, MD, 1,000 mcg at 05/13/21 5003   zinc sulfate capsule 220 mg, 220 mg, Oral, Daily, Karmen Bongo, MD, 220 mg at 05/13/21 7048  Patients Current Diet:  Diet Order             Diet Heart Room service appropriate? Yes; Fluid consistency: Thin  Diet effective now                   Precautions / Restrictions Precautions Precautions: Fall Precaution Comments: Heparin (pt bleeding from injection site) Restrictions Weight Bearing Restrictions: No   Has the patient had 2 or more falls or a fall with injury in the past year?  Yes  Prior Activity Level Limited Community (1-2x/wk): Pt. active in the community PTA  Prior Functional Level Self Care: Did the patient need help bathing, dressing, using the toilet or eating? Independent  Indoor Mobility: Did the patient need assistance with walking from room to room (with or without  device)? Independent  Stairs: Did the patient need assistance with internal or external stairs (with or without device)? Needed some help  Functional Cognition: Did the patient need help planning regular tasks such as shopping or remembering to take medications? Needed some help  Patient Information Are you of Hispanic, Latino/a,or Spanish origin?: A. No, not of Hispanic, Latino/a, or Spanish origin What is your race?: A. White Do you need or want an interpreter to communicate with a doctor or health care staff?: 0. No  Patient's Response To:  Health Literacy and Transportation Is the patient able to respond to health literacy and transportation needs?: Yes Health Literacy - How often do you need to have someone help you when you read instructions, pamphlets, or other written material from your doctor or pharmacy?: Sometimes In the past 12 months, has lack of transportation kept you from medical appointments or from getting medications?: No In the past 12 months, has lack of transportation kept you from meetings, work, or from getting things needed for daily living?: No  Home Assistive Devices / [place] Devices/Equipment: Environmental consultant (specify type) Home Equipment: Wheelchair - power, Wheelchair - manual, Conservation officer, nature (2 wheels), Rollator (4 wheels), BSC/3in1, Shower seat  Prior Device Use: Indicate devices/aids used by the patient prior to current illness, exacerbation or injury? Walker  Current Functional Level Cognition  Overall Cognitive Status: Impaired/Different from baseline Current Attention Level: Sustained Orientation Level: Oriented to person, Oriented to  place Following Commands: Follows one step commands consistently Safety/Judgement: Decreased awareness of safety, Decreased awareness of deficits General Comments: Pt now A&Ox3, needs repetition to correctly respond to some questions, step by step cues for problem solving    Extremity Assessment (includes Sensation/Coordination)  Upper Extremity Assessment: RUE deficits/detail RUE Coordination: decreased fine motor, decreased gross motor  Lower Extremity Assessment: Defer to PT evaluation    ADLs  Overall ADL's : Needs assistance/impaired Eating/Feeding: Supervision/ safety, Independent Grooming: Wash/dry hands, Wash/dry face, Oral care, Sitting, Minimal assistance Grooming Details (indicate cue type and reason): performed seated on EOB with assistance needed to load toothbrush Upper Body Bathing: Min guard, Sitting Lower Body Bathing: Maximal assistance Upper Body Dressing : Min guard, Sitting Lower Body Dressing: Maximal assistance Toilet Transfer: Minimal assistance, Ambulation, Rolling walker (2 wheels), BSC/3in1, Cueing for safety Toileting- Clothing Manipulation and Hygiene: Moderate assistance, Sit to/from stand Functional mobility during ADLs: Minimal assistance, Cueing for safety, Rolling walker (2 wheels) General ADL Comments: patient had difficulty opening toothpaste and applying to brush.  Used RUE as gross assist    Mobility  Overal bed mobility: Needs Assistance Bed Mobility: Supine to Sit Rolling: Min assist Supine to sit: Min assist General bed mobility comments: exiting towards left side of bed    Transfers  Overall transfer level: Needs assistance Equipment used: Rolling walker (2 wheels) Transfers: Sit to/from Stand Sit to Stand: Mod assist, Max assist Bed to/from chair/wheelchair/BSC transfer type:: Step pivot Step pivot transfers: Min assist General transfer comment: modA to power up to standing on first trial, cues for hand placement and "nose over toes."  Pt requiring maxA on second trial after walk with fatigue    Ambulation / Gait / Stairs / Wheelchair Mobility  Ambulation/Gait Ambulation/Gait assistance: Herbalist (Feet): 60 Feet Assistive device: Rolling walker (2 wheels) Gait Pattern/deviations: Step-through pattern, Decreased stride length, Trunk flexed General Gait Details: pt with right lateral lean, requiring minA for balance, cues for sequencing/direction and environmental negotiation. fatigues easily Gait velocity: reduced Gait velocity interpretation: <  1.8 ft/sec, indicate of risk for recurrent falls    Posture / Balance Dynamic Sitting Balance Sitting balance - Comments: required assistance with balance initially due to right lateral leaning but was able to correct with verbal cues Balance Overall balance assessment: Needs assistance Sitting-balance support: No upper extremity supported, Feet supported Sitting balance-Leahy Scale: Fair Sitting balance - Comments: required assistance with balance initially due to right lateral leaning but was able to correct with verbal cues Postural control: Right lateral lean Standing balance support: Bilateral upper extremity supported Standing balance-Leahy Scale: Poor Standing balance comment: reliant on UE support of device    Special needs/care consideration Skin ecchymosis to the BUEs and medial sacrum. and Special service needs Pt. Is Jehovah's Witness and declines blood products. Pt. Has indwelling foley; Diabetic management: Novolog 0-20 units sQ 3x daily with meals and 0-5 units sQ daily at bedtime.    Previous Home Environment (from acute therapy documentation) Living Arrangements: Alone Available Help at Discharge: Family, Available 24 hours/day Type of Home: House Home Layout: Two level, Able to live on main level with bedroom/bathroom Alternate Level Stairs-Number of Steps: flight Home Access: Ramped entrance Bathroom Shower/Tub: Tourist information centre manager: Handicapped height Bathroom Accessibility: Yes Home Care Services: Yes Type of Home Care Services: Home PT, [place] (if known): [place]  Discharge Living Setting Plans for Discharge Living Setting: Patient's home Type of Home at Discharge: [place]: Two level, Able to live on main level with bedroom/bathroom Discharge Home Access: [place] entrance Discharge Bathroom Shower/Tub: Walk-in shower Discharge Bathroom Toilet: Handicapped height Discharge Bathroom Accessibility: Yes How Accessible: Accessible via wheelchair, Accessible via walker Does the patient have any problems obtaining your medications?: No  Social/Family/Support Systems Patient Roles: Other (Comment) Contact Information: Jeani Hawking (daughter) Anticipated Caregiver: [phone] Ability/Limitations of Caregiver: Can provide Min A Caregiver Availability: 24/7 Discharge Plan Discussed with Primary Caregiver: Yes Is Caregiver In Agreement with Plan?: Yes Does Caregiver/Family have Issues with Lodging/Transportation while Pt is in Rehab?: No  Goals Patient/Family Goal for Rehab: PT/OT/SLP Mod I Expected length of stay: 7-10 days Pt/Family Agrees to Admission and willing to participate: Yes Program Orientation Provided & Reviewed with Pt/Caregiver Including Roles  & Responsibilities: Yes  Decrease burden of Care through IP rehab admission: Specialzed equipment needs, Decrease number of caregivers, Bowel and bladder program, and Patient/family education  Possible need for SNF placement upon discharge: not anticipated  Patient Condition: I have reviewed medical records from [facility], spoken with CM, and patient. I met with patient at the bedside for inpatient rehabilitation assessment.  Patient will benefit from ongoing PT, OT, and SLP, can actively participate in 3 hours of therapy a day 5 days of the week, and can make measurable gains during the admission.   Patient will also benefit from the coordinated team approach during an Inpatient Acute Rehabilitation admission.  The patient will receive intensive therapy as well as Rehabilitation physician, nursing, social worker, and care management interventions.  Due to safety, skin/wound care, disease management, medication administration, pain management, and patient education the patient requires 24 hour a day rehabilitation nursing.  The patient is currently min-mod A with mobility and basic ADLs.  Discharge setting and therapy post discharge at home with home health is anticipated.  Patient has agreed to participate in the Acute Inpatient Rehabilitation Program and will admit today.  Preadmission Screen Completed By:  Genella Mech, 05/13/2021 9:22 AM ______________________________________________________________________   Discussed status with Dr.  Chieko Neises  on 05/13/21 at   and received approval for admission today.  Admission Coordinator:  Genella Mech, CCC-SLP, time 1010/Date 05/13/21   Assessment/Plan: Diagnosis: Does the need for close, 24 hr/day Medical supervision in concert with the patient's rehab needs make it unreasonable for this patient to be served in a less intensive setting? Yes Co-Morbidities requiring supervision/potential complications: CHF, DM Z3G 8.5; on new insulin; SPC (+)- severe anemia- jehova's witness; COVID; new stroke with R hemi and confusion Due to bladder management, bowel management, safety, skin/wound care, disease management, medication administration, pain management, and patient education, does the patient require 24 hr/day rehab nursing? Yes Does the patient require coordinated care of a physician, rehab nurse, PT, OT, and SLP to address physical and functional deficits in the context of the above medical diagnosis(es)? Yes Addressing deficits in the following areas: balance, endurance, locomotion, strength, transferring, bowel/bladder control, bathing, dressing,  feeding, grooming, toileting, cognition, speech, language, and swallowing Can the patient actively participate in an intensive therapy program of at least 3 hrs of therapy 5 days a week? Yes The potential for patient to make measurable gains while on inpatient rehab is fair Anticipated functional outcomes upon discharge from inpatient rehab: modified independent PT, modified independent OT, modified independent SLP Estimated rehab length of stay to reach the above functional goals is: 7-10 days Anticipated discharge destination: Home 10. Overall Rehab/Functional Prognosis: good and fair   MD Signature:

## 2021-05-14 LAB — COMPREHENSIVE METABOLIC PANEL
ALT: 10 U/L (ref 0–44)
AST: 15 U/L (ref 15–41)
Albumin: 2.5 g/dL — ABNORMAL LOW (ref 3.5–5.0)
Alkaline Phosphatase: 73 U/L (ref 38–126)
Anion gap: 10 (ref 5–15)
BUN: 26 mg/dL — ABNORMAL HIGH (ref 8–23)
CO2: 21 mmol/L — ABNORMAL LOW (ref 22–32)
Calcium: 8.2 mg/dL — ABNORMAL LOW (ref 8.9–10.3)
Chloride: 113 mmol/L — ABNORMAL HIGH (ref 98–111)
Creatinine, Ser: 1.79 mg/dL — ABNORMAL HIGH (ref 0.61–1.24)
GFR, Estimated: 36 mL/min — ABNORMAL LOW (ref >60)
Glucose, Bld: 171 mg/dL — ABNORMAL HIGH (ref 70–99)
Potassium: 4.8 mmol/L (ref 3.5–5.1)
Sodium: 144 mmol/L (ref 135–145)
Total Bilirubin: 0.9 mg/dL (ref 0.3–1.2)
Total Protein: 5.9 g/dL — ABNORMAL LOW (ref 6.5–8.1)

## 2021-05-14 LAB — CBC WITH DIFFERENTIAL/PLATELET
Abs Immature Granulocytes: 0.26 10*3/uL — ABNORMAL HIGH (ref 0.00–0.07)
Basophils Absolute: 0 10*3/uL (ref 0.0–0.1)
Basophils Relative: 0 %
Eosinophils Absolute: 0 10*3/uL (ref 0.0–0.5)
Eosinophils Relative: 0 %
HCT: 18.2 % — ABNORMAL LOW (ref 39.0–52.0)
Hemoglobin: 5.9 g/dL — CL (ref 13.0–17.0)
Immature Granulocytes: 9 %
Lymphocytes Relative: 48 %
Lymphs Abs: 1.3 10*3/uL (ref 0.7–4.0)
MCH: 31.2 pg (ref 26.0–34.0)
MCHC: 32.4 g/dL (ref 30.0–36.0)
MCV: 96.3 fL (ref 80.0–100.0)
Monocytes Absolute: 0.3 10*3/uL (ref 0.1–1.0)
Monocytes Relative: 11 %
Neutro Abs: 0.9 10*3/uL — ABNORMAL LOW (ref 1.7–7.7)
Neutrophils Relative %: 32 %
Platelets: 294 10*3/uL (ref 150–400)
RBC: 1.89 MIL/uL — ABNORMAL LOW (ref 4.22–5.81)
RDW: 22.5 % — ABNORMAL HIGH (ref 11.5–15.5)
WBC: 2.8 10*3/uL — ABNORMAL LOW (ref 4.0–10.5)
nRBC: 4.2 % — ABNORMAL HIGH (ref 0.0–0.2)

## 2021-05-14 LAB — GLUCOSE, CAPILLARY
Glucose-Capillary: 160 mg/dL — ABNORMAL HIGH (ref 70–99)
Glucose-Capillary: 252 mg/dL — ABNORMAL HIGH (ref 70–99)
Glucose-Capillary: 109 mg/dL — ABNORMAL HIGH (ref 70–99)
Glucose-Capillary: 132 mg/dL — ABNORMAL HIGH (ref 70–99)

## 2021-05-14 LAB — PATHOLOGIST SMEAR REVIEW

## 2021-05-14 MED ORDER — GERHARDT'S BUTT CREAM
TOPICAL_CREAM | Freq: Two times a day (BID) | CUTANEOUS | Status: DC
  Administered 2021-05-16: 1 via TOPICAL
  Filled 2021-05-14: qty 1

## 2021-05-14 MED ORDER — HYDRALAZINE HCL 50 MG PO TABS
50.0000 mg | ORAL_TABLET | Freq: Three times a day (TID) | ORAL | Status: DC
  Administered 2021-05-14 – 2021-05-21 (×20): 50 mg via ORAL
  Filled 2021-05-14 (×20): qty 1

## 2021-05-14 MED ORDER — PNEUMOCOCCAL VAC POLYVALENT 25 MCG/0.5ML IJ INJ
0.5000 mL | INJECTION | INTRAMUSCULAR | Status: AC
  Administered 2021-05-16: 12:00:00 0.5 mL via INTRAMUSCULAR
  Filled 2021-05-14: qty 0.5

## 2021-05-14 MED ORDER — CARVEDILOL 6.25 MG PO TABS
9.3750 mg | ORAL_TABLET | Freq: Two times a day (BID) | ORAL | Status: DC
  Administered 2021-05-15 – 2021-05-21 (×13): 9.375 mg via ORAL
  Filled 2021-05-14 (×13): qty 1

## 2021-05-14 NOTE — Progress Notes (Signed)
Inpatient Rehabilitation  Patient information reviewed and entered into eRehab system by Siennah Barrasso Kourtnie Sachs, OTR/L.   Information including medical coding, functional ability and quality indicators will be reviewed and updated through discharge.    

## 2021-05-14 NOTE — Evaluation (Signed)
Speech Language Pathology Assessment and Plan  Patient Details  Name: [name] MRN: [id number] Date of Birth: 1930-09-11  SLP Diagnosis: Dysarthria;Cognitive Impairments;Speech and Language deficits  Rehab Potential: Good ELOS: 12-14 days   Today's Date: 05/14/2021 SLP Individual Time: 1008-1100 SLP Individual Time Calculation (min): 6 min  Hospital Problem: Principal Problem:   Lacunar infarction ([place]) Active Problems:   CKD (chronic kidney disease) stage 3, GFR 30-59 ml/min (HCC)   Chronic anemia   DNR (do not resuscitate)  Past Medical History:  Past Medical History:  Diagnosis Date   Abdominal aortic aneurysm    a. Aortic duplex 06/2014: mild aneurysmal dilatation of proximal abdominal aorta measuring 3.4x3.4cm. No sig change from 2012. F/u due 06/2016;    Arthritis    "right knee; never bothered me" (03/26/2016)   Balanitis xerotica obliterans    with meatal stenosis and distal stricture   Carpal tunnel syndrome of right wrist 06/24/2015   Chronic anemia 04/17/2018   Chronic combined systolic and diastolic CHF (congestive heart failure) ([place])    Coronary artery disease involving coronary bypass graft of native heart with angina pectoris ([place]) 09/19/2014   Cath for Inf STEMI 03/27/16  Conclusions: Significant native coronary artery disease, including 70% ostial LAD stenosis, 50% mid LCx lesion, and 99% proximal RCA disease with TIMI-1 flow (chronic per prior cath reports). Patent LIMA to LAD. Acutely occluded SVG to PDA within previously placed stent. 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 (03/26/2016)   Stenosis of left carotid artery    Suprapubic catheter ([place])    Vitamin D deficiency 05/27/2018   Past Surgical History:  Past Surgical History:  Procedure Laterality Date   APPENDECTOMY     CARDIAC CATHETERIZATION  12/11/2010   Dr. Chase Picket - subsequent cath - normal LV systolic function, no renal artery stenosis, severe 2-vessel disease with subtotaled RCA prox and distal 60% lesion and complex 70% area of narrowing of ostium of LAD   CARDIAC CATHETERIZATION N/A 03/26/2016   Procedure: Left Heart Cath and Coronary Angiography;  Surgeon: Nelva Bush, MD;  Location: [place] [facility];  Service: Cardiovascular;  Laterality: N/A;   CARDIAC CATHETERIZATION N/A 03/26/2016   Procedure: Coronary Stent Intervention;  Surgeon: Nelva Bush, MD;  Location: [place] [facility];  Service: Cardiovascular: 100% In-stent thrombosis of pros SVG-RCA (Xience DES) --> treated with PromusDES 3.0 x 18 (3.3 mm)   CARDIAC CATHETERIZATION N/A 03/26/2016   Procedure: Bypass Graft Angiography;  Surgeon: Nelva Bush, MD;  Location: [place] [facility];  Service: Cardiovascular;  Laterality: N/A;   CORONARY ANGIOPLASTY WITH STENT PLACEMENT  06/18/2014   PCI to SVG-RPDA 06/18/14 (Xience [place] DES 3.0 x 18 mm -3.35 mm),    CORONARY ARTERY BYPASS GRAFT  12/15/2010   X2, Dr Servando Snare; LIMA to LAD, SVG to PDA;    CORONARY BALLOON ANGIOPLASTY N/A 04/18/2018  Procedure: CORONARY BALLOON ANGIOPLASTY;  Surgeon: Leonie Man, MD;  Location: [place] [facility];  Service: Cardiovascular;;; high pressure scoring and noncompliant balloon PTCA of SVG-RCA ISR ostial and proximal   CORONARY/GRAFT ANGIOGRAPHY N/A 08/27/2017   Procedure: CORONARY/GRAFT ANGIOGRAPHY;  Surgeon: Nelva Bush, MD;   Location: [place] [facility];  Service: Cardiovascular;; pLAD 70%, ostD1 50%.  mCx 60%, OM2 80%. pRCA 95% & mRCA 100% - rPDA 70%. LIMA-mLAD patent. SVG-rPDA 10% ISR.    CYSTOSCOPY WITH URETHRAL DILATATION     IR 3D INDEPENDENT WKST  10/26/2018   IR ANGIO INTRA EXTRACRAN SEL COM CAROTID INNOMINATE BILAT MOD SED  02/25/2017   IR ANGIO INTRA EXTRACRAN SEL COM CAROTID INNOMINATE BILAT MOD SED  10/19/2017   Dr. Kathee Delton: L Common Carotid - ECA & major branches widely patent. ICA ~20% distal to bulb & 50% in supraclinoid segment. LMCA-distal 1/3 MI ~905 stenosis with post-stenotic dilation into inferior division. ~50% prox Basilar A stenosis @ anterior Inf Cerebellar A. 50% R ICA   IR ANGIO INTRA EXTRACRAN SEL COM CAROTID INNOMINATE UNI L MOD SED  06/06/2018   IR ANGIO INTRA EXTRACRAN SEL COM CAROTID INNOMINATE UNI L MOD SED  10/26/2018   IR ANGIO VERTEBRAL SEL SUBCLAVIAN INNOMINATE UNI L MOD SED  02/25/2017   IR ANGIO VERTEBRAL SEL SUBCLAVIAN INNOMINATE UNI R MOD SED  10/19/2017   IR ANGIO VERTEBRAL SEL VERTEBRAL UNI L MOD SED  10/19/2017   IR EXCHANGE BILIARY DRAIN  05/06/2021   IR GENERIC HISTORICAL  01/21/2016   IR RADIOLOGIST EVAL & MGMT 01/21/2016 [facility]   IR GENERIC HISTORICAL  02/03/2016   IR CATHETER TUBE CHANGE 02/03/2016 Marybelle Killings, MD [facility]   IR RADIOLOGIST EVAL & MGMT  11/09/2017   LEFT HEART CATH AND CORONARY ANGIOGRAPHY N/A 04/18/2018   Procedure: LEFT HEART CATH AND CORONARY ANGIOGRAPHY;  Surgeon: Leonie Man, MD;  Location: [place] [facility];  Service: Cardiovascular;  Laterality: N/A; stable findings on last cath with exception of 75% in-stent restenosis of SVG-RCA ostial stent treated with PTCA.     LEFT HEART CATHETERIZATION WITH CORONARY ANGIOGRAM N/A 06/18/2014   Procedure: LEFT HEART CATHETERIZATION WITH CORONARY ANGIOGRAM;  Surgeon: Leonie Man, MD;  Location: [facility];  Service: Cardiovascular;  -- severe disease of SVG-rPDA   NO PAST SURGERIES     RADIOLOGY WITH  ANESTHESIA N/A 01/24/2015   Procedure: STENT ASSISTED ANGIOPLASTY (RADIOLOGY WITH ANESTHESIA);  Surgeon: Luanne Bras, MD;  Location: [place];  Service: Radiology;  Laterality: N/A;   RADIOLOGY WITH ANESTHESIA N/A 06/06/2018   Procedure: STENTING;  Surgeon: Luanne Bras, MD;  Location: [place];  Service: Radiology;  Laterality: N/A;   RADIOLOGY WITH ANESTHESIA N/A 10/26/2018   Procedure: RADIOLOGY WITH ANESTHESIA;  Surgeon: Luanne Bras, MD;  Location: [place];  Service: Radiology;  Laterality: N/A;   TONSILLECTOMY     TRANSTHORACIC ECHOCARDIOGRAM  08/2017; 04/2018:    A) EF 60-65%. Mild LVH. No RWMA. Gr 1 DD. Mod LA dilation.;  B)  EF 35-40%.  Severe LVH.  GRII DD.  Apical anteroseptal hypokinesis.  Akinesis of the apex.    Assessment / Plan / Recommendation Clinical Impression   Patient is a 87 y.o. year old male with history of AAA, OSA, chronic anemia.  CAD with CABG, CVA, chronic combined CHF, diabetes mellitus, hypertension, hyperlipidemia and recent cholecystostomy tube placement.  Last hospitalized [facility] from 10/29-2011/4 for acute cholecystitis.  He did receive IV iron infusion 10/29 with  increase hemoglobin of 6.5-7.2.  Patient was not a surgical candidate due to generalized condition as well as anemia refusal of blood products patient is Jehovah's Witness.  He was treated with Zosyn and percutaneous drain was placed.  Presented 04/29/2021 with increasing cough/fatigue and generalized weakness.  CT of the abdomen pelvis showed inflammatory changes in gallbladder consistent with cholecystitis.  Indwelling cholecystostomy tube noted.  Colonic diverticulosis without diverticulitis.  Admission chemistries unremarkable except sodium 129 glucose 229,HGb 5.8 urine culture greater 100,000 Pseudomonas, COVID-19 .  Patient was placed on broad-spectrum antibiotics changed to [place].  He was started on low-dose steroids as well as remdesivir completing course.  He completed his course of isolation  12/23.  On 05/05/2021 with increasing confusion.  MRI of the brain showed 3 mm acute infarct within the posterior left frontal lobe white matter.  Known chronic cortical subcortical infarcts within the left frontal and parietal lobes.  Additional known small chronic infarcts within the left occipital lobe posterior left temporal and posterior right frontal lobe.  MRA showed no evidence of intracranial mass.  No midline shift.  Echocardiogram ejection fraction of 45 to 50%.  Left ventricle demonstrated some global hypokinesis.   He continued a chronic indwelling suprapubic catheter last changed 12/21.  Maintained on aspirin and Plavix for CVA prophylaxis x3 weeks then Plavix alone.  He is tolerating a regular consistency diet.  Therapy evaluations completed due to patient decreased functional mobility was admitted for a comprehensive rehab program.  Pt presents with moderately severe cognitive impairments at this time, spoke with daughter about pt history and home set up. She reports since November 3 she has been staying with him due to medical issues and decreased ability to care for self. Pt unable to maintain sustained attention to task >45 seconds, falling into deep sleep often mid-sentence. Pt oriented to self, not oriented to location or recent events. Pt was able to state correct age, stated the year as 54. Pt unable to solve simple math problems, unable to recall any story elements or name >2 animals in 1 minutes. Daughter reports this is a change from baseline, however pt has demonstrated cognitive decline in recent weeks. Pt intelligibility at word and phrase level ~50%, not responsive to cues to utilize strategies this time 2' fatigue. Pt will likely require 24/7 supervision/assist at discharge, potential LTC placement per daughter. Pt will benefit from skilled ST in CIR with focus on increasing independence and safety with daily routine and use of compensatory speech intelligibility.    Skilled  Therapeutic Interventions          Pt participating in portions of the SLUMS as well as further non standardized assessments of speech, language and cognition. Please see above.    SLP Assessment  Patient will need skilled [facility] during CIR admission    Recommendations  Oral Care Recommendations: Oral care BID Patient destination: Home (tbd) Follow up Recommendations: Other (comment) (tbd, daughter needs to be involved in decision) Equipment Recommended: None recommended by SLP    SLP Frequency 3 to 5 out of 7 days   SLP Duration  SLP Intensity  SLP Treatment/Interventions 12-14 days  Minumum of 1-2 x/day, 30 to 90 minutes  Cognitive remediation/compensation;Cueing hierarchy;Functional tasks;Patient/family education;Therapeutic Activities;Therapeutic Exercise;Speech/Language facilitation;Internal/external aids;Environmental controls    Pain Pain Assessment Pain Scale: 0-10 Pain Score: 0-No pain  Prior Functioning Cognitive/Linguistic Baseline: Baseline deficits Baseline deficit details: since 11/3 per daughter Type of Home: House  Lives With: Alone (daughter has been  staying with him since 11/3, caregiver at times. Daughter states when she is gone he will not move, won't eat, etc) Available Help at Discharge: Family;Available PRN/intermittently  SLP Evaluation Cognition Overall Cognitive Status: Impaired/Different from baseline Arousal/Alertness: Lethargic Orientation Level: Oriented to person;Disoriented to time;Disoriented to place;Disoriented to situation  Comprehension Auditory Comprehension Overall Auditory Comprehension: Impaired Yes/No Questions: Impaired Basic Biographical Questions: 51-75% accurate Commands: Impaired One Step Basic Commands: 25-49% accurate Two Step Basic Commands: 0-24% accurate Conversation: Simple Interfering Components: Attention;Processing speed;Working memory EffectiveTechniques: Extra processing  time;Repetition;Increased volume;Slowed speech Visual Recognition/Discrimination Discrimination: Not tested Reading Comprehension Reading Status: Not tested Expression Expression Primary Mode of Expression: Verbal Verbal Expression Overall Verbal Expression: Impaired Initiation: No impairment Automatic Speech: Name;Counting Level of Generative/Spontaneous Verbalization: Word;Phrase Interfering Components: Attention Written Expression Dominant Hand: Left Oral Motor Oral Motor/Sensory Function Overall Oral Motor/Sensory Function: Within functional limits Motor Speech Overall Motor Speech: Impaired Phonation: Low vocal intensity Articulation: Impaired Level of Impairment: Phrase Intelligibility: Intelligibility reduced Word: 50-74% accurate Phrase: 25-49% accurate Sentence: 25-49% accurate Effective Techniques: Slow rate;Over-articulate;Increased vocal intensity;Pacing  Care Tool Care Tool Cognition Ability to hear (with hearing aid or hearing appliances if normally used Ability to hear (with hearing aid or hearing appliances if normally used): 2. Moderate difficulty - speaker has to increase volume and speak distinctly   Expression of Ideas and Wants Expression of Ideas and Wants: 2. Frequent difficulty - frequently exhibits difficulty with expressing needs and ideas   Understanding Verbal and Non-Verbal Content Understanding Verbal and Non-Verbal Content: 2. Sometimes understands - understands only basic conversations or simple, direct phrases. Frequently requires cues to understand  Memory/Recall Ability Memory/Recall Ability : Current season   Intelligibility: Intelligibility reduced Word: 50-74% accurate Phrase: 25-49% accurate Sentence: 25-49% accurate  Short Term Goals: Week 1: SLP Short Term Goal 1 (Week 1): Pt will demonstrate sustained attention to functional tasks for 1-3 minutes with mod A multimodal cues SLP Short Term Goal 2 (Week 1): Pt will increase  orientation to person, place, time and situation with max A cues for use of external aides SLP Short Term Goal 3 (Week 1): Pt will complete basic problem solving tasks with 50% accuracy provided max A cues SLP Short Term Goal 4 (Week 1): Pt will recall functional information from OT/PT sessions with max A cues/aides SLP Short Term Goal 5 (Week 1): Pt will utilize compensatory strategies for speech intellibility with max A cues  Refer to Care Plan for Long Term Goals  Recommendations for other services: None   Discharge Criteria: Patient will be discharged from SLP if patient refuses treatment 3 consecutive times without medical reason, if treatment goals not met, if there is a change in medical status, if patient makes no progress towards goals or if patient is discharged from hospital.  The above assessment, treatment plan, treatment alternatives and goals were discussed and mutually agreed upon: by patient and by family  Dewaine Conger 05/14/2021, 1:02 PM

## 2021-05-14 NOTE — Evaluation (Signed)
Physical Therapy Assessment and Plan  Patient Details  Name: [name] MRN: [id number] Date of Birth: 11-20-1930  PT Diagnosis: Abnormal posture, Abnormality of gait, Cognitive deficits, Coordination disorder, Difficulty walking, Impaired cognition, and Muscle weakness Rehab Potential: Fair ELOS: 12-14 days   Today's Date: 05/14/2021 PT Individual Time: 4098-1191 PT Individual Time Calculation (min): 30 min  and Today's Date: 05/14/2021 PT Missed Time: 30 Minutes Missed Time Reason: Patient fatigue   Hospital Problem: Principal Problem:   Lacunar infarction [facility]) Active Problems:   CKD (chronic kidney disease) stage 3, GFR 30-59 ml/min (HCC)   Chronic anemia   DNR (do not resuscitate)   Past Medical History:  Past Medical History:  Diagnosis Date   Abdominal aortic aneurysm    a. Aortic duplex 06/2014: mild aneurysmal dilatation of proximal abdominal aorta measuring 3.4x3.4cm. No sig change from 2012. F/u due 06/2016;    Arthritis    "right knee; never bothered me" (03/26/2016)   Balanitis xerotica obliterans    with meatal stenosis and distal stricture   Carpal tunnel syndrome of right wrist 06/24/2015   Chronic anemia 04/17/2018   Chronic combined systolic and diastolic CHF (congestive heart failure) ([place])    Coronary artery disease involving coronary bypass graft of native heart with angina pectoris ([place]) 09/19/2014   Cath for Inf STEMI 03/27/16  Conclusions: Significant native coronary artery disease, including 70% ostial LAD stenosis, 50% mid LCx lesion, and 99% proximal RCA disease with TIMI-1 flow (chronic per prior cath reports). Patent LIMA to LAD. Acutely occluded SVG to PDA within previously placed stent. 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 (03/26/2016)   Stenosis of left carotid artery    Suprapubic catheter ([place])    Vitamin D deficiency 05/27/2018   Past Surgical History:  Past Surgical History:  Procedure Laterality Date   APPENDECTOMY     CARDIAC CATHETERIZATION  12/11/2010   Dr. Chase Picket - subsequent cath - normal LV systolic function, no renal artery stenosis, severe 2-vessel disease with subtotaled RCA prox and distal 60% lesion and complex 70% area of narrowing of ostium of LAD   CARDIAC CATHETERIZATION N/A 03/26/2016   Procedure: Left Heart Cath and Coronary Angiography;  Surgeon: Nelva Bush, MD;  Location: [place] [facility];  Service: Cardiovascular;  Laterality: N/A;   CARDIAC CATHETERIZATION N/A 03/26/2016   Procedure: Coronary Stent Intervention;  Surgeon: Nelva Bush, MD;  Location: [place] [facility];  Service: Cardiovascular: 100% In-stent thrombosis of pros SVG-RCA (Xience DES) --> treated with PromusDES 3.0 x 18 (3.3 mm)   CARDIAC CATHETERIZATION N/A 03/26/2016   Procedure: Bypass Graft Angiography;  Surgeon: Nelva Bush, MD;  Location: [place] [facility];  Service: Cardiovascular;  Laterality: N/A;   CORONARY ANGIOPLASTY WITH STENT PLACEMENT  06/18/2014   PCI to SVG-RPDA 06/18/14 (Xience [place] DES 3.0 x 18 mm -3.35 mm),  CORONARY ARTERY BYPASS GRAFT  12/15/2010   X2, Dr Servando Snare; LIMA to LAD, SVG to PDA;    CORONARY BALLOON ANGIOPLASTY N/A 04/18/2018   Procedure: CORONARY BALLOON ANGIOPLASTY;  Surgeon: Leonie Man, MD;  Location: [place] [facility];  Service: Cardiovascular;;; high pressure scoring and noncompliant balloon  PTCA of SVG-RCA ISR ostial and proximal   CORONARY/GRAFT ANGIOGRAPHY N/A 08/27/2017   Procedure: CORONARY/GRAFT ANGIOGRAPHY;  Surgeon: Nelva Bush, MD;  Location: [place] [facility];  Service: Cardiovascular;; pLAD 70%, ostD1 50%.  mCx 60%, OM2 80%. pRCA 95% & mRCA 100% - rPDA 70%. LIMA-mLAD patent. SVG-rPDA 10% ISR.    CYSTOSCOPY WITH URETHRAL DILATATION     IR 3D INDEPENDENT WKST  10/26/2018   IR ANGIO INTRA EXTRACRAN SEL COM CAROTID INNOMINATE BILAT MOD SED  02/25/2017   IR ANGIO INTRA EXTRACRAN SEL COM CAROTID INNOMINATE BILAT MOD SED  10/19/2017   Dr. Kathee Delton: L Common Carotid - ECA & major branches widely patent. ICA ~20% distal to bulb & 50% in supraclinoid segment. LMCA-distal 1/3 MI ~905 stenosis with post-stenotic dilation into inferior division. ~50% prox Basilar A stenosis @ anterior Inf Cerebellar A. 50% R ICA   IR ANGIO INTRA EXTRACRAN SEL COM CAROTID INNOMINATE UNI L MOD SED  06/06/2018   IR ANGIO INTRA EXTRACRAN SEL COM CAROTID INNOMINATE UNI L MOD SED  10/26/2018   IR ANGIO VERTEBRAL SEL SUBCLAVIAN INNOMINATE UNI L MOD SED  02/25/2017   IR ANGIO VERTEBRAL SEL SUBCLAVIAN INNOMINATE UNI R MOD SED  10/19/2017   IR ANGIO VERTEBRAL SEL VERTEBRAL UNI L MOD SED  10/19/2017   IR EXCHANGE BILIARY DRAIN  05/06/2021   IR GENERIC HISTORICAL  01/21/2016   IR RADIOLOGIST EVAL & MGMT 01/21/2016 [facility]   IR GENERIC HISTORICAL  02/03/2016   IR CATHETER TUBE CHANGE 02/03/2016 Marybelle Killings, MD [facility]INTER[facility]   IR RADIOLOGIST EVAL & MGMT  11/09/2017   LEFT HEART CATH AND CORONARY ANGIOGRAPHY N/A 04/18/2018   Procedure: LEFT HEART CATH AND CORONARY ANGIOGRAPHY;  Surgeon: Leonie Man, MD;  Location: [place] [facility];  Service: Cardiovascular;  Laterality: N/A; stable findings on last cath with exception of 75% in-stent restenosis of SVG-RCA ostial stent treated with PTCA.     LEFT HEART CATHETERIZATION WITH CORONARY ANGIOGRAM N/A 06/18/2014   Procedure: LEFT HEART CATHETERIZATION WITH CORONARY  ANGIOGRAM;  Surgeon: Leonie Man, MD;  Location: [facility] C[facility] LAB;  Service: Cardiovascular;  -- severe disease of SVG-rPDA   NO PAST SURGERIES     RADIOLOGY WITH ANESTHESIA N/A 01/24/2015   Procedure: STENT ASSISTED ANGIOPLASTY (RADIOLOGY WITH ANESTHESIA);  Surgeon: Luanne Bras, MD;  Location: [place];  Service: Radiology;  Laterality: N/A;   RADIOLOGY WITH ANESTHESIA N/A 06/06/2018   Procedure: STENTING;  Surgeon: Luanne Bras, MD;  Location: [place];  Service: Radiology;  Laterality: N/A;   RADIOLOGY WITH ANESTHESIA N/A 10/26/2018   Procedure: RADIOLOGY WITH ANESTHESIA;  Surgeon: Luanne Bras, MD;  Location: [place];  Service: Radiology;  Laterality: N/A;   TONSILLECTOMY     TRANSTHORACIC ECHOCARDIOGRAM  08/2017; 04/2018:    A) EF 60-65%. Mild LVH. No RWMA. Gr 1 DD. Mod LA dilation.;  B)  EF 35-40%.  Severe LVH.  GRII DD.  Apical anteroseptal hypokinesis.  Akinesis of the apex.    Assessment & Plan Clinical Impression: Patient is a 87 y.o. year old male with history of AAA, OSA, chronic anemia.  CAD with CABG, CVA, chronic combined CHF, diabetes mellitus, hypertension, hyperlipidemia  and recent cholecystostomy tube placement.  Last hospitalized [facility] from 10/29-2011/4 for acute cholecystitis.  He did receive IV iron infusion 10/29 with increase hemoglobin of 6.5-7.2.  Patient was not a surgical candidate due to generalized condition as well as anemia refusal of blood products patient is Jehovah's Witness.  He was treated with Zosyn and percutaneous drain was placed.  Presented 04/29/2021 with increasing cough/fatigue and generalized weakness.  CT of the abdomen pelvis showed inflammatory changes in gallbladder consistent with cholecystitis.  Indwelling cholecystostomy tube noted.  Colonic diverticulosis without diverticulitis.  Admission chemistries unremarkable except sodium 129 glucose 229,HGb 5.8 urine culture greater 100,000 Pseudomonas, COVID-19 .  Patient was placed on broad-spectrum  antibiotics changed to [place].  He was started on low-dose steroids as well as remdesivir completing course.  He completed his course of isolation 12/23.  On 05/05/2021 with increasing confusion.  MRI of the brain showed 3 mm acute infarct within the posterior left frontal lobe white matter.  Known chronic cortical subcortical infarcts within the left frontal and parietal lobes.  Additional known small chronic infarcts within the left occipital lobe posterior left temporal and posterior right frontal lobe.  MRA showed no evidence of intracranial mass.  No midline shift.  Echocardiogram ejection fraction of 45 to 50%.  Left ventricle demonstrated some global hypokinesis.   He continued a chronic indwelling suprapubic catheter last changed 12/21.  Maintained on aspirin and Plavix for CVA prophylaxis x3 weeks then Plavix alone.  He is tolerating a regular consistency diet.  Therapy evaluations completed due to patient decreased functional mobility was admitted for a comprehensive rehab program.  Patient currently requires max with mobility secondary to muscle weakness, decreased cardiorespiratoy endurance, decreased coordination and decreased motor planning, decreased initiation, decreased attention, decreased awareness, decreased problem solving, decreased safety awareness, decreased memory, and delayed processing, and decreased sitting balance, decreased standing balance, decreased postural control, and decreased balance strategies.  Prior to hospitalization, patient was independent  with mobility and lived with Alone (daughter has been staying with him since 11/3, caregiver at times. Daughter states when she is gone he will not move, won't eat, etc) in a House home.  Home access is  Ramped entrance.  Patient will benefit from skilled PT intervention to maximize safe functional mobility, minimize fall risk, and decrease caregiver burden for planned discharge home with 24 hour assist.  Anticipate patient will  benefit from follow up [facility] at discharge.  PT - End of Session Activity Tolerance: Tolerates < 10 min activity, no significant change in vital signs Endurance Deficit: Yes Endurance Deficit Description: Severe fatigue and deconditioning, unable to complete full sentence prior to falling asleep PT Assessment Rehab Potential (ACUTE/IP ONLY): Fair PT Barriers to Discharge: Decreased caregiver support;Lack of/limited family support;Behavior PT Patient demonstrates impairments in the following area(s): Balance;Endurance;Motor;Perception;Safety PT Transfers Functional Problem(s): Bed Mobility;Bed to Chair;Car;Furniture PT Locomotion Functional Problem(s): Ambulation;Wheelchair Mobility;Stairs PT Plan PT Intensity: Minimum of 1-2 x/day ,45 to 90 minutes PT Frequency: 5 out of 7 days PT Duration Estimated Length of Stay: 12-14 days PT Treatment/Interventions: Ambulation/gait training;Community reintegration;DME/adaptive equipment instruction;Neuromuscular re-education;Psychosocial support;UE/LE Strength taining/ROM;Wheelchair propulsion/positioning;Stair training;Balance/vestibular training;Discharge planning;Functional electrical stimulation;Pain management;Skin care/wound management;Therapeutic Activities;UE/LE Coordination activities;Visual/perceptual remediation/compensation;Therapeutic Exercise;Functional mobility training;Disease management/prevention;Cognitive remediation/compensation;Patient/family education;Splinting/orthotics PT Transfers Anticipated Outcome(s): Min A PT Locomotion Anticipated Outcome(s): Min A PT Recommendation Follow Up Recommendations: Other (comment) (Unable to obtain thorough evaluation, follow up TBD) Patient destination:  (TBD) Equipment Recommended: To be determined   PT Evaluation Precautions/Restrictions Precautions Precautions: Fall Restrictions Weight Bearing Restrictions:  No Pain Interference Pain Interference Pain Effect on Sleep: 1. Rarely or not at  all Pain Interference with Therapy Activities: 1. Rarely or not at all Pain Interference with Day-to-Day Activities: 1. Rarely or not at all Home Living/Prior [place] Available Help at Discharge: Family;Available PRN/intermittently Type of Home: House Home Access: Ramped entrance Home Layout: Two level;Able to live on main level with bedroom/bathroom Alternate Level Stairs-Number of Steps: flight Alternate Level Stairs-Rails: Right Bathroom Shower/Tub: Walk-in shower Bathroom Toilet: Handicapped height Bathroom Accessibility: Yes Additional Comments: two walker accessible bathrooms on main level that have high toilets, and either tub shower or walk in shower  Lives With: Alone (daughter has been staying with him since 11/3, caregiver at times. Daughter states when she is gone he will not move, won't eat, etc) Prior Function Level of Independence: Independent with basic ADLs;Independent with homemaking with ambulation;Independent with gait;Independent with transfers (pt's daughter reports before Nov 2022, pt was independent but has declined since)  Able to Take Stairs?: No Driving: No Vision/Perception  Vision - History Ability to See in Adequate Light: 0 Adequate Perception Perception: Within Functional Limits Praxis Praxis: Impaired Praxis Impairment Details: Initiation;Motor planning  Cognition Overall Cognitive Status: Impaired/Different from baseline Arousal/Alertness: Lethargic Orientation Level: Disoriented to time;Disoriented to situation;Oriented to place;Oriented to person Year: 2021 Month:  (would only say 12/18 for month) Day of Week: Incorrect Memory: Impaired Problem Solving: Impaired Safety/Judgment: Impaired Comments: Pt unable to stay awake during eval, very disoriented and often said gibberish Sensation Sensation Light Touch: Appears Intact Coordination Gross Motor Movements are Fluid and Coordinated: No Coordination and Movement  Description: Global deconditioning, lethargy Finger Nose Finger Test: Dysmetric on BUE, increased time Heel Shin Test: NT, pt unbale to understand instructions Motor  Motor Motor: Other (comment) (Significant R trunk lean while seated) Motor - Skilled Clinical Observations: Limited by severe lethargy and disorientation on eval   Trunk/Postural Assessment  Cervical Assessment Cervical Assessment: Exceptions to [facility] (Forward head) Thoracic Assessment Thoracic Assessment: Exceptions to [facility] (Rounded shoulders, R trunk lean) Lumbar Assessment Lumbar Assessment: Exceptions to [facility] (Posterior pelvic tilt) Postural Control Postural Control: Deficits on evaluation Head Control: Delayed Trunk Control: Delayed Righting Reactions: Delayed Protective Responses: Absent  Balance Balance Balance Assessed: Yes Static Sitting Balance Static Sitting - Balance Support: Feet supported;Bilateral upper extremity supported Static Sitting - Level of Assistance: 5: Stand by assistance Dynamic Sitting Balance Dynamic Sitting - Balance Support: Feet supported;No upper extremity supported;During functional activity Dynamic Sitting - Level of Assistance: 4: Min assist Dynamic Sitting - Balance Activities: Forward lean/weight shifting;Reaching across midline;Lateral lean/weight shifting;Reaching for objects Static Standing Balance Static Standing - Balance Support: During functional activity;Bilateral upper extremity supported Static Standing - Level of Assistance: 3: Mod assist Dynamic Standing Balance Dynamic Standing - Balance Support: During functional activity;Bilateral upper extremity supported Dynamic Standing - Level of Assistance: 3: Mod assist Dynamic Standing - Balance Activities: Reaching across midline;Forward lean/weight shifting;Reaching for objects Extremity Assessment  RLE Assessment RLE Assessment: Not tested General Strength Comments: Grossly 4-/5. Unable to assess due to lethargy and  disorientation LLE Assessment LLE Assessment: Not tested General Strength Comments: Grossly 4-/5. Unable to assess due to lethargy and disorientation  Care Tool Care Tool Bed Mobility Roll left and right activity   Roll left and right assist level: Minimal Assistance - Patient > 75%    Sit to lying activity   Sit to lying assist level: Minimal Assistance - Patient > 75%    Lying to sitting on side  of bed activity   Lying to sitting on side of bed assist level: the ability to move from lying on the back to sitting on the side of the bed with no back support.: Minimal Assistance - Patient > 75%     Care Tool Transfers Sit to stand transfer   Sit to stand assist level: Maximal Assistance - Patient 25 - 49% (RW)    Chair/bed transfer   Chair/bed transfer assist level: Maximal Assistance - Patient 25 - 49% (RW)     Psychologist, counselling transfer activity did not occur: Safety/medical concerns (Severe lethargy, global deconditioning)        Care Tool Locomotion Ambulation Ambulation activity did not occur: Safety/medical concerns (Severe lethargy, global deconditioning)        Walk 10 feet activity Walk 10 feet activity did not occur: Safety/medical concerns (Severe lethargy, global deconditioning)       Walk 50 feet with 2 turns activity Walk 50 feet with 2 turns activity did not occur: Safety/medical concerns (Severe lethargy, global deconditioning)      Walk 150 feet activity Walk 150 feet activity did not occur: Safety/medical concerns (Severe lethargy, global deconditioning)      Walk 10 feet on uneven surfaces activity Walk 10 feet on uneven surfaces activity did not occur: Safety/medical concerns (Severe lethargy, global deconditioning)      Stairs Stair activity did not occur: Safety/medical concerns (Severe lethargy, global deconditioning)        Walk up/down 1 step activity Walk up/down 1 step or curb (drop down) activity did not occur:  Safety/medical concerns (Severe lethargy, global deconditioning)      Walk up/down 4 steps activity Walk up/down 4 steps activity did not occur: Safety/medical concerns (Severe lethargy, global deconditioning)      Walk up/down 12 steps activity Walk up/down 12 steps activity did not occur: Safety/medical concerns (Severe lethargy, global deconditioning)      Pick up small objects from floor Pick up small object from the floor (from standing position) activity did not occur: Safety/medical concerns (Severe lethargy, global deconditioning)      Wheelchair Is the patient using a wheelchair?: Yes Type of Wheelchair: Manual   Wheelchair assist level: Dependent - Patient 0%    Wheel 50 feet with 2 turns activity   Assist Level: Dependent - Patient 0%  Wheel 150 feet activity   Assist Level: Dependent - Patient 0%    Refer to Care Plan for Long Term Goals  SHORT TERM GOAL WEEK 1 PT Short Term Goal 1 (Week 1): Pt will perform sit <>stand w/mod A and LRAD PT Short Term Goal 2 (Week 1): Pt will ambulate 25' w/LRAD and mod A PT Short Term Goal 3 (Week 1): Pt will perform bed <>chair transfers w/LRAD and mod A PT Short Term Goal 4 (Week 1): Pt will demonstrate sustained attention to task for 2 mintues w/S* and min verbal cues  Recommendations for other services: None   Skilled Therapeutic Intervention Evaluation completed (see details above and below). Pt received seated in recliner in room, NT present obtaining vitals. Pt extremely lethargic and disoriented to time, place and situation throughout eval, frequently falling asleep mid-sentence despite noxious stimuli. Unable to obtain subjective portion of eval and pt's daughter not present, information obtained from SLP's eval. Pt denied pain but asked therapist "when the ship is going to dock" and "when will we land on [place]". Attempted to reorient, but pt fell  asleep before therapist could speak. Pt unsafe to attempt to ambulate due to his  inability to stay awake during functional tasks. Noted significant R trunk lean while seated and pt unable to follow verbal cues to fix posture. Sit <>stand pivot from recliner to EOB w/RW and max A for anterior weight shift, trunk support, R lean correction, stimuli to stay awake and balance. Stand <>sit to EOB w/mod A for eccentric control and trunk support, as pt let himself fall backwards and almost hit head on bedrail. Sit <>supine w/min A for BLE management and pt able to demonstrate rolling to L and R using bedrail w/min A and mod verbal cues for motor planning. Pt was left supine in bed, posey belt on, all needs in reach. Safety plan updated. Missed 30 minutes of skilled PT due to severe lethargy.   Mobility Bed Mobility Bed Mobility: Rolling Right;Rolling Left;Sit to Supine Rolling Right: Minimal Assistance - Patient > 75% Rolling Left: Minimal Assistance - Patient > 75% Sit to Supine: Minimal Assistance - Patient > 75% Transfers Transfers: Sit to Stand;Stand to Sit;Stand Pivot Transfers Sit to Stand: Maximal Assistance - Patient 25-49% Stand to Sit: Moderate Assistance - Patient 50-74% Stand Pivot Transfers: Moderate Assistance - Patient 50 - 74% Stand Pivot Transfer Details: Verbal cues for technique;Verbal cues for safe use of DME/AE;Tactile cues for initiation;Verbal cues for sequencing;Verbal cues for precautions/safety Transfer (Assistive device): Rolling walker Locomotion  Gait Ambulation: No Gait Gait: No Gait velocity: reduced Stairs / Additional Locomotion Stairs: No Wheelchair Mobility Wheelchair Mobility: No   Discharge Criteria: Patient will be discharged from PT if patient refuses treatment 3 consecutive times without medical reason, if treatment goals not met, if there is a change in medical status, if patient makes no progress towards goals or if patient is discharged from hospital.  The above assessment, treatment plan, treatment alternatives and goals were  discussed and mutually agreed upon: by patient  Cruzita Lederer Charlsie Fleeger, PT, DPT 05/14/2021, 1:57 PM

## 2021-05-14 NOTE — Plan of Care (Signed)
°  Problem: RH Cognition - SLP Goal: RH LTG Patient will demonstrate orientation with cues Description:  LTG:  Patient will demonstrate orientation to person/place/time/situation with cues (SLP)   Flowsheets (Taken 05/14/2021 1221) LTG Patient will demonstrate orientation to:  Person  Place  Time  Situation LTG: Patient will demonstrate orientation using cueing (SLP): Minimal Assistance - Patient > 75%   Problem: RH Comprehension Communication Goal: LTG Patient will comprehend basic/complex auditory (SLP) Description: LTG: Patient will comprehend basic/complex auditory information with cues (SLP). Flowsheets (Taken 05/14/2021 1221) LTG: Patient will comprehend: Basic auditory information LTG: Patient will comprehend auditory information with cueing (SLP): Supervision   Problem: RH Expression Communication Goal: LTG Patient will increase speech intelligibility (SLP) Description: LTG: Patient will increase speech intelligibility at word/phrase/conversation level with cues, % of the time (SLP) Flowsheets (Taken 05/14/2021 1221) LTG: Patient will increase speech intelligibility (SLP): Supervision Level: Phrase   Problem: RH Problem Solving Goal: LTG Patient will demonstrate problem solving for (SLP) Description: LTG:  Patient will demonstrate problem solving for basic/complex daily situations with cues  (SLP) Flowsheets (Taken 05/14/2021 1221) LTG: Patient will demonstrate problem solving for (SLP): Basic daily situations LTG Patient will demonstrate problem solving for: Minimal Assistance - Patient > 75%   Problem: RH Memory Goal: LTG Patient will demonstrate ability for day to day (SLP) Description: LTG:   Patient will demonstrate ability for day to day recall/carryover during cognitive/linguistic activities with assist  (SLP) Flowsheets (Taken 05/14/2021 1221) LTG: Patient will demonstrate ability for day to day recall:  New information  Biographical information LTG: Patient will  demonstrate ability for day to day recall/carryover during cognitive/linguistic activities with assist (SLP): Minimal Assistance - Patient > 75% Goal: LTG Patient will use memory compensatory aids to (SLP) Description: LTG:  Patient will use memory compensatory aids to recall biographical/new, daily complex information with cues (SLP) Flowsheets (Taken 05/14/2021 1221) LTG: Patient will use memory compensatory aids to (SLP): Minimal Assistance - Patient > 75%   Problem: RH Attention Goal: LTG Patient will demonstrate this level of attention during functional activites (SLP) Description: LTG:  Patient will will demonstrate this level of attention during functional activites (SLP) Flowsheets (Taken 05/14/2021 1221) Patient will demonstrate during cognitive/linguistic activities the attention type of: Sustained Patient will demonstrate this level of attention during cognitive/linguistic activities in: Controlled LTG: Patient will demonstrate this level of attention during cognitive/linguistic activities with assistance of (SLP): Minimal Assistance - Patient > 75%   Problem: RH Awareness Goal: LTG: Patient will demonstrate awareness during functional activites type of (SLP) Description: LTG: Patient will demonstrate awareness during functional activites type of (SLP) Flowsheets (Taken 05/14/2021 1221) Patient will demonstrate during cognitive/linguistic activities awareness type of: Emergent LTG: Patient will demonstrate awareness during cognitive/linguistic activities with assistance of (SLP): Minimal Assistance - Patient > 75%

## 2021-05-14 NOTE — Progress Notes (Signed)
PROGRESS NOTE   Subjective/Complaints: No complaints this morning DNR bracelet in place, but no MOST form filled. Consulted palliative care to help establish goals of care with family Labs stable  ROS: Unable to obtain given cognitive deficits   Objective:   No results found. Recent Labs    05/13/21 1732 05/14/21 0543  WBC 3.3* 2.8*  HGB 6.1* 5.9*  HCT 19.2* 18.2*  PLT 272 294   Recent Labs    05/13/21 1732 05/14/21 0543  NA  --  144  K  --  4.8  CL  --  113*  CO2  --  21*  GLUCOSE  --  171*  BUN  --  26*  CREATININE 1.91* 1.79*  CALCIUM  --  8.2*    Intake/Output Summary (Last 24 hours) at 05/14/2021 1925 Last data filed at 05/14/2021 1829 Gross per 24 hour  Intake 100 ml  Output 2600 ml  Net -2500 ml     Pressure Injury 05/05/21 Buttocks Right Stage 1 -  Intact skin with non-blanchable redness of a localized area usually over a bony prominence. small open area on right buttocks (Active)  05/05/21 1700  Location: Buttocks  Location Orientation: Right  Staging: Stage 1 -  Intact skin with non-blanchable redness of a localized area usually over a bony prominence.  Wound Description (Comments): small open area on right buttocks  Present on Admission:     Physical Exam: Vital Signs Blood pressure (!) 158/73, pulse (!) 59, temperature 98.7 F (37.1 C), resp. rate 20, SpO2 95 %. Gen: no distress, normal appearing HEENT: oral mucosa pink and moist, NCAT Cardio: Bradycardia Chest: normal effort, normal rate of breathing Abd: soft, non-distended Ext: no edema Psych: pleasant, normal affect Skin: intact Musculoskeletal:     Cervical back: Normal range of motion. No rigidity.     Comments: LUE/LLE 5-/5 RUE 4/5 except R hand- grip 3-/5 and FA 2-/5- has hx of chronic R hand damage- held open - not using- except as secondary support when eating RLE_ 4+/5 in HF/KE/KF/DF and PF  Skin:    General: Skin is  warm and dry.     Comments: SPC and Perc drain as above IV's in L AC fossa and R wrist- look OK  Neurological:     Comments: Patient is alert.  Makes eye contact with examiner.  Provides name and age.  Follows commands.  Limited but fair medical historian. HOH- but making some jokes- Ox3 upon questioning Sensation to light touch intact x4 except R hand  Psychiatric:        Mood and Affect: Mood normal.        Behavior: Behavior normal.    Assessment/Plan: 1. Functional deficits which require 3+ hours per day of interdisciplinary therapy in a comprehensive inpatient rehab setting. Physiatrist is providing close team supervision and 24 hour management of active medical problems listed below. Physiatrist and rehab team continue to assess barriers to discharge/monitor patient progress toward functional and medical goals  Care Tool:  Bathing    Body parts bathed by patient: Right arm, Left arm, Chest, Abdomen, Right upper leg, Left upper leg, Face   Body parts bathed by helper:  Front perineal area, Buttocks, Right lower leg, Left lower leg     Bathing assist Assist Level: Moderate Assistance - Patient 50 - 74%     Upper Body Dressing/Undressing Upper body dressing   What is the patient wearing?: Pull over shirt    Upper body assist Assist Level: Minimal Assistance - Patient > 75%    Lower Body Dressing/Undressing Lower body dressing      What is the patient wearing?: Incontinence brief, Pants     Lower body assist Assist for lower body dressing: Maximal Assistance - Patient 25 - 49%     Toileting Toileting    Toileting assist Assist for toileting: Dependent - Patient 0% (pt has foley; dependent on staff to empty)     Transfers Chair/bed transfer  Transfers assist     Chair/bed transfer assist level: Maximal Assistance - Patient 25 - 49% (RW)     Locomotion Ambulation   Ambulation assist   Ambulation activity did not occur: Safety/medical concerns (Severe  lethargy, global deconditioning)          Walk 10 feet activity   Assist  Walk 10 feet activity did not occur: Safety/medical concerns (Severe lethargy, global deconditioning)        Walk 50 feet activity   Assist Walk 50 feet with 2 turns activity did not occur: Safety/medical concerns (Severe lethargy, global deconditioning)         Walk 150 feet activity   Assist Walk 150 feet activity did not occur: Safety/medical concerns (Severe lethargy, global deconditioning)         Walk 10 feet on uneven surface  activity   Assist Walk 10 feet on uneven surfaces activity did not occur: Safety/medical concerns (Severe lethargy, global deconditioning)         Wheelchair     Assist Is the patient using a wheelchair?: Yes Type of Wheelchair: Manual    Wheelchair assist level: Dependent - Patient 0%      Wheelchair 50 feet with 2 turns activity    Assist        Assist Level: Dependent - Patient 0%   Wheelchair 150 feet activity     Assist      Assist Level: Dependent - Patient 0%   Blood pressure (!) 158/73, pulse (!) 59, temperature 98.7 F (37.1 C), resp. rate 20, SpO2 95 %.  Medical Problem List and Plan: 1. 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 2.  Impaired mobility: continue Subcutaneous heparin q8H.             -antiplatelet therapy: Aspirin 81 mg daily and Plavix 75 mg day x3 weeks then Plavix alone 3. Pain Management: Tylenol as needed 4. Mood: Provide emotional support             -antipsychotic agents: N/A 5. Neuropsych: This patient is capable of making decisions on his own behalf. 6. Skin/Wound Care: Routine skin checks 7. Fluids/Electrolytes/Nutrition: Routine in and outs with follow-up chemistries 8.   Hypertension.  Decrease Coreg to 9.275mg  BID given bradycardia below. Increase hydralazine to 50 mg every 8 hours.  9.  History of CAD with CABG.  Continue Imdur 60 mg twice daily 10.  COVID-19 pneumonia.  Treated with remdesivir and steroid.  Isolation discontinued 12/23 11.  Symptomatic anemia.  Declines blood product due to Jehovah witness..  Continue Aranesp follow-up CBC 12.  Diabetes mellitus.  Hemoglobin A1c 8.5.  SSI.  Patient on Glucophage 500 mg twice daily as well as Amaryl 4 mg daily prior to admission.  Resume as needed 13.  Percutaneous cholecystostomy drain malfunction.  Patient hospitalized [facility] in October for cholecystitis.  Continue Cardura.  Not a surgical candidate status post percutaneous cholecystostomy drain. 14.  Hyperlipidemia.  Zetia 15.  Pseudomonas UTI.  Completing course of IV Fortaz. 16. Constipation- LBM 1 week ago- will give sorbitol and monitor.  17. Bradycardia: decrease coreg to 9.375mg  BID    LOS: 1 days A FACE TO FACE EVALUATION WAS PERFORMED  Martha Clan P Azari Hasler 05/14/2021, 7:25 PM

## 2021-05-14 NOTE — Patient Care Conference (Signed)
Inpatient RehabilitationTeam Conference and Plan of Care Update Date: 05/14/2021   Time: 11:37 AM    Patient Name: [name]      Medical Record Number: [id number]  Date of Birth: Oct 29, 1930 Sex: Male         Room/Bed: 7T02I/0X73Z-32 Payor Info: Payor: MEDICARE / Plan: MEDICARE PART A AND B / Product Type: *No Product type* /    Admit Date/Time:  05/13/2021  5:07 PM  Primary Diagnosis:  Lacunar infarction [facility])  Hospital Problems: Principal Problem:   Lacunar infarction ([place]) Active Problems:   CKD (chronic kidney disease) stage 3, GFR 30-59 ml/min (HCC)   Chronic anemia   DNR (do not resuscitate)    Expected Discharge Date: Expected Discharge Date:  (Evals pending)  Team Members Present: Physician leading conference: Dr. Leeroy Cha Social Worker Present: Erlene Quan, BSW Nurse Present: Dorien Chihuahua, RN PT Present: Becky Sax, PT OT Present: Other (comment) [facility] Alphonsa Gin, OT)     Current Status/Progress Goal Weekly Team Focus  Bowel/Bladder   Pt has a superpubic cath for bladder and is incontinent of bowel  Pt to become continent of bowel by letting staff kniw when he has to go  Pt will become continent of bowel by the time he is discharged   Swallow/Nutrition/ Hydration             ADL's   Max LBD, Mod A bathing, Mod A toilet transfers sit > stand and stand pivot RW  Supervision-min A  Functional transfers, ADL retraining, endurance, cognitive compensatory strategies   Mobility             Communication   mod A for speech intelligibility  Supervision  introduce speech intelligibility strategies   Safety/Cognition/ Behavioral Observations  max A basic cog  Min A  alertness, sustained attention, orientation, simple PS and functional recall   Pain   Pt pain has been 0/10  Pt will remain out of pain  Pt will remain out of pain during his whole hospital stay   Skin   Pt has a sacrum foam on his bottom with some redness  Foam dressing will help  redness on bottom go away by helping keep presure off of it  Pt sacrum will be free of redness by the end of hospital stay     Discharge Planning:  assesment pending   Team Discussion: Patient post lacunar infarct; Pseudomonas UTI, DM and anemia. Note impaired cognition and confusion with speech intelligibility issues with poor attention and memory.   Patient on target to meet rehab goals: Currently PT eval pending. Max assist for lower body care mod assist for bathing and mod assist needed for sit - stand and toileting due to a right lean. Goals for discharge set for close supervision - min assist.  *See Care Plan and progress notes for long and short-term goals.   Revisions to Treatment Plan:  Follow up on goals of care and DNR status Follow up on biliary drain care orders Medications for UTI   Teaching Needs: Safety, transfers, toileting, drain care, foley care, medications/insulin administration, secondary risk management, etc.  Current Barriers to Discharge: Decreased caregiver support and biliary drain care  Possible Resolutions to Barriers: Family education with daughter     Medical Summary Current Status: stage 1 buttock pressure, lacunar infarction, overweight BMI 27.52, congitively impaired, heavy right sided lean, UTI, hypertension  Barriers to Discharge: Medical stability;Behavior;Weight;Wound care  Barriers to Discharge Comments: stage 1 buttock pressure, lacunar infarction, overweight BMI  27.52, congitively impaired, heavy right sided lean, UTI, hypertension Possible Resolutions to [employer]: continue daily wound care, provide dietary and exercise education to patient and wife, continue IV antibiotics   Continued Need for Acute Rehabilitation Level of Care: The patient requires daily medical management by a physician with specialized training in physical medicine and rehabilitation for the following reasons: Direction of a multidisciplinary physical  rehabilitation program to maximize functional independence : Yes Medical management of patient stability for increased activity during participation in an intensive rehabilitation regime.: Yes Analysis of laboratory values and/or radiology reports with any subsequent need for medication adjustment and/or medical intervention. : Yes   I attest that I was present, lead the team conference, and concur with the assessment and plan of the team.   Dorien Chihuahua B 05/14/2021, 11:43 AM

## 2021-05-14 NOTE — Progress Notes (Signed)
Patient ID: [name], male   DOB: 1931-01-21, 87 y.o.   MRN: [id number] Met with the patient to introduce self and role. Patient is very pleasant with cognitive deficits. Reported he was able to manage medications, diet, skin care/drain care, suprapubic catheter all on his own PTA. Discussed rehab routine, team conference and plan of care. Reviewed secondary risks including HF, HTN, HLD (LDL 31) and DM (A1C 8.5). Patient noted he managed biliary drain however went to the MD office for suprapubic catheter changes. Discussed recommendation for increased calcium and protein in diet. Also reviewed skin care for stage 1 on sacrum. Continue to follow along to discharge to address educational needs and collaborate with the team to facilitate preparation for discharge. Margarito Liner

## 2021-05-14 NOTE — Plan of Care (Signed)
°  Problem: RH Balance Goal: LTG Patient will maintain dynamic standing balance (PT) Description: LTG:  Patient will maintain dynamic standing balance with assistance during mobility activities (PT) Flowsheets (Taken 05/14/2021 1416) LTG: Pt will maintain dynamic standing balance during mobility activities with:: (LRAD) Minimal Assistance - Patient > 75%   Problem: Sit to Stand Goal: LTG:  Patient will perform sit to stand with assistance level (PT) Description: LTG:  Patient will perform sit to stand with assistance level (PT) Flowsheets (Taken 05/14/2021 1416) LTG: PT will perform sit to stand in preparation for functional mobility with assistance level: (LRAD) Minimal Assistance - Patient > 75%   Problem: RH Bed Mobility Goal: LTG Patient will perform bed mobility with assist (PT) Description: LTG: Patient will perform bed mobility with assistance, with/without cues (PT). Flowsheets (Taken 05/14/2021 1416) LTG: Pt will perform bed mobility with assistance level of: Supervision/Verbal cueing   Problem: RH Bed to Chair Transfers Goal: LTG Patient will perform bed/chair transfers w/assist (PT) Description: LTG: Patient will perform bed to chair transfers with assistance (PT). Flowsheets (Taken 05/14/2021 1416) LTG: Pt will perform Bed to Chair Transfers with assistance level: (LRAD) Minimal Assistance - Patient > 75%   Problem: RH Car Transfers Goal: LTG Patient will perform car transfers with assist (PT) Description: LTG: Patient will perform car transfers with assistance (PT). Flowsheets (Taken 05/14/2021 1416) LTG: Pt will perform car transfers with assist:: (LRAD) Minimal Assistance - Patient > 75%   Problem: RH Ambulation Goal: LTG Patient will ambulate in home environment (PT) Description: LTG: Patient will ambulate in home environment, # of feet with assistance (PT). Flowsheets (Taken 05/14/2021 1416) LTG: Pt will ambulate in home environ  assist needed:: (LRAD) Minimal  Assistance - Patient > 75% LTG: Ambulation distance in home environment: 72'   Problem: RH Wheelchair Mobility Goal: LTG Patient will propel w/c in controlled environment (PT) Description: LTG: Patient will propel wheelchair in controlled environment, # of feet with assist (PT) Flowsheets (Taken 05/14/2021 1416) LTG: Pt will propel w/c in controlled environ  assist needed:: Supervision/Verbal cueing LTG: Propel w/c distance in controlled environment: 100'   Problem: RH Memory Goal: LTG Patient will demonstrate ability for day to day recall/carry over during activities of daily living with assistance level (PT) Description: LTG:  Patient will demonstrate ability for day to day recall/carry over during activities of daily living with assistance level (PT). Flowsheets (Taken 05/14/2021 1416) LTG:  Patient will demonstrate ability for day to day recall/carry over during activities of daily living with assistance level (PT): Supervision   Problem: RH Attention Goal: LTG Patient will demonstrate this level of attention during functional activites (PT) Description: LTG:  Patient will demonstrate this level of attention during functional activites (PT) Flowsheets (Taken 05/14/2021 1416) Patient will demonstrate this level of attention during functional activites: Selective Patient will demonstrate above attention level in the following environment:  Home  Controlled LTG: Patient will demonstrate attention during functional mobility with assistance of: Supervision

## 2021-05-14 NOTE — Progress Notes (Signed)
Occupational Therapy Assessment and Plan  Patient Details  Name: [name] MRN: [id number] Date of Birth: [birth year]  OT Diagnosis: abnormal posture, acute pain, altered mental status, cognitive deficits, and muscle weakness (generalized) Rehab Potential: Rehab Potential (ACUTE ONLY): [place] ELOS: 12-14 days   Today's Date: 05/14/2021 OT Individual Time: 0805-0900 OT Individual Time Calculation (min): 55 min     Hospital Problem: Principal Problem:   Lacunar infarction ([place]) Active Problems:   CKD (chronic kidney disease) stage 3, GFR 30-59 ml/min (HCC)   Chronic anemia   DNR (do not resuscitate)   Past Medical History:  Past Medical History:  Diagnosis Date   Abdominal aortic aneurysm    a. Aortic duplex 06/2014: mild aneurysmal dilatation of proximal abdominal aorta measuring 3.4x3.4cm. No sig change from 2012. F/u due 06/2016;    Arthritis    "right knee; never bothered me" (03/26/2016)   Balanitis xerotica obliterans    with meatal stenosis and distal stricture   Carpal tunnel syndrome of right wrist 06/24/2015   Chronic anemia 04/17/2018   Chronic combined systolic and diastolic CHF (congestive heart failure) ([place])    Coronary artery disease involving coronary bypass graft of native heart with angina pectoris ([place]) 09/19/2014   Cath for Inf STEMI 03/27/16  Conclusions: Significant native coronary artery disease, including 70% ostial LAD stenosis, 50% mid LCx lesion, and 99% proximal RCA disease with TIMI-1 flow (chronic per prior cath reports). Patent LIMA to LAD. Acutely occluded SVG to PDA within previously placed stent. Successful PCI to proximal SVG to PDA in-stent restenosis/thrombosis with placement of a Promus Pre   Essential hypertension    Foley catheter in place    "been wearing it for a couple months now" (03/26/2016)   Hemispheric carotid artery syndrome 12/22/2016   Hyperlipidemia associated with type 2 diabetes mellitus ([place])    Left middle cerebral artery  stroke ([place]) 05/27/2018   Poorly controlled type 2 diabetes mellitus with peripheral neuropathy (HCC)    Postoperative atrial fibrillation ([place]) 11/2010   Post CABG, no sign recurrence   Refusal of blood transfusions as patient is Jehovah's Witness    Sleep apnea    Not on CPAP. (03/26/2016)   Stenosis of left carotid artery    Suprapubic catheter ([place])    Vitamin D deficiency 05/27/2018   Past Surgical History:  Past Surgical History:  Procedure Laterality Date   APPENDECTOMY     CARDIAC CATHETERIZATION  12/11/2010   Dr. Chase Picket - subsequent cath - normal LV systolic function, no renal artery stenosis, severe 2-vessel disease with subtotaled RCA prox and distal 60% lesion and complex 70% area of narrowing of ostium of LAD   CARDIAC CATHETERIZATION N/A 03/26/2016   Procedure: Left Heart Cath and Coronary Angiography;  Surgeon: Nelva Bush, MD;  Location: [facility];  Service: Cardiovascular;  Laterality: N/A;   CARDIAC CATHETERIZATION N/A 03/26/2016   Procedure: Coronary Stent Intervention;  Surgeon: Nelva Bush, MD;  Location: [place] [facility];  Service: Cardiovascular: 100% In-stent thrombosis of pros SVG-RCA (Xience DES) --> treated with PromusDES 3.0 x 18 (3.3 mm)   CARDIAC CATHETERIZATION N/A 03/26/2016   Procedure: Bypass Graft Angiography;  Surgeon: Nelva Bush, MD;  Location: [facility] [facility];  Service: Cardiovascular;  Laterality: N/A;   CORONARY ANGIOPLASTY WITH STENT PLACEMENT  06/18/2014   PCI to SVG-RPDA 06/18/14 (Xience [place] DES 3.0 x 18 mm -3.35 mm),    CORONARY ARTERY BYPASS GRAFT  12/15/2010   X2, Dr Servando Snare; LIMA  to LAD, SVG to PDA;    CORONARY BALLOON ANGIOPLASTY N/A 04/18/2018   Procedure: CORONARY BALLOON ANGIOPLASTY;  Surgeon: Leonie Man, MD;  Location: [facility];  Service: Cardiovascular;;; high pressure scoring and noncompliant balloon PTCA of SVG-RCA ISR ostial and proximal   CORONARY/GRAFT ANGIOGRAPHY N/A 08/27/2017    Procedure: CORONARY/GRAFT ANGIOGRAPHY;  Surgeon: Nelva Bush, MD;  Location: [facility];  Service: Cardiovascular;; pLAD 70%, ostD1 50%.  mCx 60%, OM2 80%. pRCA 95% & mRCA 100% - rPDA 70%. LIMA-mLAD patent. SVG-rPDA 10% ISR.    CYSTOSCOPY WITH URETHRAL DILATATION     IR 3D INDEPENDENT WKST  10/26/2018   IR ANGIO INTRA EXTRACRAN SEL COM CAROTID INNOMINATE BILAT MOD SED  02/25/2017   IR ANGIO INTRA EXTRACRAN SEL COM CAROTID INNOMINATE BILAT MOD SED  10/19/2017   Dr. Kathee Delton: L Common Carotid - ECA & major branches widely patent. ICA ~20% distal to bulb & 50% in supraclinoid segment. LMCA-distal 1/3 MI ~905 stenosis with post-stenotic dilation into inferior division. ~50% prox Basilar A stenosis @ anterior Inf Cerebellar A. 50% R ICA   IR ANGIO INTRA EXTRACRAN SEL COM CAROTID INNOMINATE UNI L MOD SED  06/06/2018   IR ANGIO INTRA EXTRACRAN SEL COM CAROTID INNOMINATE UNI L MOD SED  10/26/2018   IR ANGIO VERTEBRAL SEL SUBCLAVIAN INNOMINATE UNI L MOD SED  02/25/2017   IR ANGIO VERTEBRAL SEL SUBCLAVIAN INNOMINATE UNI R MOD SED  10/19/2017   IR ANGIO VERTEBRAL SEL VERTEBRAL UNI L MOD SED  10/19/2017   IR EXCHANGE BILIARY DRAIN  05/06/2021   IR GENERIC HISTORICAL  01/21/2016   IR RADIOLOGIST EVAL & MGMT 01/21/2016 [facility]   IR GENERIC HISTORICAL  02/03/2016   IR CATHETER TUBE CHANGE 02/03/2016 Marybelle Killings, MD [facility]   IR RADIOLOGIST EVAL & MGMT  11/09/2017   LEFT HEART CATH AND CORONARY ANGIOGRAPHY N/A 04/18/2018   Procedure: LEFT HEART CATH AND CORONARY ANGIOGRAPHY;  Surgeon: Leonie Man, MD;  Location: [facility];  Service: Cardiovascular;  Laterality: N/A; stable findings on last cath with exception of 75% in-stent restenosis of SVG-RCA ostial stent treated with PTCA.     LEFT HEART CATHETERIZATION WITH CORONARY ANGIOGRAM N/A 06/18/2014   Procedure: LEFT HEART CATHETERIZATION WITH CORONARY ANGIOGRAM;  Surgeon: Leonie Man, MD;  Location: [facility];  Service: Cardiovascular;   -- severe disease of SVG-rPDA   NO PAST SURGERIES     RADIOLOGY WITH ANESTHESIA N/A 01/24/2015   Procedure: STENT ASSISTED ANGIOPLASTY (RADIOLOGY WITH ANESTHESIA);  Surgeon: Luanne Bras, MD;  Location: [facility];  Service: Radiology;  Laterality: N/A;   RADIOLOGY WITH ANESTHESIA N/A 06/06/2018   Procedure: STENTING;  Surgeon: Luanne Bras, MD;  Location: [facility];  Service: Radiology;  Laterality: N/A;   RADIOLOGY WITH ANESTHESIA N/A 10/26/2018   Procedure: RADIOLOGY WITH ANESTHESIA;  Surgeon: Luanne Bras, MD;  Location: [facility];  Service: Radiology;  Laterality: N/A;   TONSILLECTOMY     TRANSTHORACIC ECHOCARDIOGRAM  08/2017; 04/2018:    A) EF 60-65%. Mild LVH. No RWMA. Gr 1 DD. Mod LA dilation.;  B)  EF 35-40%.  Severe LVH.  GRII DD.  Apical anteroseptal hypokinesis.  Akinesis of the apex.    Assessment & Plan Clinical Impression:  Jaspal Pultz. Leamon Arnt is a 87 year old Left handed male with history of AAA, OSA, chronic anemia.  CAD with CABG, CVA, chronic combined CHF, diabetes mellitus, hypertension, hyperlipidemia and recent cholecystostomy tube placement.  Last hospitalized [facility] from  10/29-2011/4 for acute cholecystitis.  He did receive IV iron infusion 10/29 with increase hemoglobin of 6.5-7.2.  Patient was not a surgical candidate due to generalized condition as well as anemia refusal of blood products patient is Jehovah's Witness.  He was treated with Zosyn and percutaneous drain was placed.  Presented 04/29/2021 with increasing cough/fatigue and generalized weakness.  CT of the abdomen pelvis showed inflammatory changes in gallbladder consistent with cholecystitis.  Indwelling cholecystostomy tube noted.  Colonic diverticulosis without diverticulitis.  Admission chemistries unremarkable except sodium 129 glucose 229,HGb 5.8 urine culture greater 100,000 Pseudomonas, COVID-19 .  Patient was placed on broad-spectrum antibiotics changed to [place].  He was started on low-dose steroids as well as  remdesivir completing course.  He completed his course of isolation 12/23.  On 05/05/2021 with increasing confusion.  MRI of the brain showed 3 mm acute infarct within the posterior left frontal lobe white matter.  Known chronic cortical subcortical infarcts within the left frontal and parietal lobes.  Additional known small chronic infarcts within the left occipital lobe posterior left temporal and posterior right frontal lobe.  MRA showed no evidence of intracranial mass.  No midline shift.  Echocardiogram ejection fraction of 45 to 50%.  Left ventricle demonstrated some global hypokinesis.   He continued a chronic indwelling suprapubic catheter last changed 12/21.  Maintained on aspirin and Plavix for CVA prophylaxis x3 weeks then Plavix alone.  He is tolerating a regular consistency diet.  Therapy evaluations completed due to patient decreased functional mobility was admitted for a comprehensive rehab program. Patient transferred to CIR on 05/13/2021 .    Patient currently requires max with basic self-care skills secondary to muscle weakness, decreased cardiorespiratoy endurance and decreased oxygen support, impaired timing and sequencing, decreased coordination, and decreased motor planning, decreased initiation, decreased attention, decreased awareness, decreased problem solving, decreased safety awareness, decreased memory, and delayed processing, and decreased sitting balance, decreased standing balance, decreased postural control, and decreased balance strategies.  Prior to hospitalization, patient could complete all ADLs independently.  Patient will benefit from skilled intervention to decrease level of assist with basic self-care skills, increase independence with basic self-care skills, and increase level of independence with iADL prior to discharge with caregiver.  Anticipate patient will require 24 hour supervision and minimal physical assistance and follow up home health.  OT - End of  Session Activity Tolerance: Tolerates < 10 min activity, no significant change in vital signs Endurance Deficit: Yes Endurance Deficit Description: Attention deficit minimizes participation and fatigued easily with simple self-care tasks and transfers OT Assessment Rehab Potential (ACUTE ONLY): Fair OT Barriers to Discharge: Decreased caregiver support;Incontinence;Behavior;Other (comments) (cognitive deficits that minimize carryover of education) OT Patient demonstrates impairments in the following area(s): Balance;Cognition;Endurance;Motor;Pain;Perception;Safety;Skin Integrity OT Basic ADL's Functional Problem(s): Eating;Grooming;Bathing;Dressing;Toileting OT Advanced ADL's Functional Problem(s): Simple Meal Preparation OT Transfers Functional Problem(s): Toilet;Tub/Shower OT Additional Impairment(s): Fuctional Use of Upper Extremity OT Plan OT Intensity: Minimum of 1-2 x/day, 45 to 90 minutes OT Frequency: 5 out of 7 days OT Duration/Estimated Length of Stay: 12-14 days OT Treatment/Interventions: Balance/vestibular training;Discharge planning;Pain management;Self Care/advanced ADL retraining;Therapeutic Activities;UE/LE Coordination activities;Cognitive remediation/compensation;Functional mobility training;Disease mangement/prevention;Patient/family education;Therapeutic Exercise;Community reintegration;DME/adaptive equipment instruction;Psychosocial support;UE/LE Strength taining/ROM;Wheelchair propulsion/positioning OT Self Feeding Anticipated Outcome(s): Set up A OT Basic Self-Care Anticipated Outcome(s): Supervision-min A OT Toileting Anticipated Outcome(s): Supervision-min A OT Bathroom Transfers Anticipated Outcome(s): Supervision OT Recommendation Patient destination: Home Follow Up Recommendations: Home health OT Equipment Recommended: 3 in 1 bedside comode;To be determined   OT Evaluation Precautions/Restrictions  Precautions Precautions: Fall Restrictions Weight Bearing  Restrictions: No Home Living/Prior Functioning Home Living Family/patient expects to be discharged to:: Private residence Living Arrangements: Alone Available Help at Discharge: Family, Available PRN/intermittently Type of Home: House Home Access: Ramped entrance Home Layout: Two level, Able to live on main level with bedroom/bathroom Alternate Level Stairs-Number of Steps: flight Alternate Level Stairs-Rails: Right Bathroom Shower/Tub: Multimedia programmer: Handicapped height Bathroom Accessibility: Yes Additional Comments: two walker accessible bathrooms on main level that have high toilets, and either tub shower or walk in shower  Lives With: Alone (Home living and PLOF difficult to assess due to cognitive deficts and no caregiver present, history based on chart review only) IADL History Homemaking Responsibilities: No Homemaking Comments: Pt was making meals on his own, but since decline, pt's daughter is heavily relied on for caregiving Current License: No Occupation: Retired Leisure and Hobbies: Track racing  Prior Function Level of Independence: Independent with basic ADLs, Independent with homemaking with ambulation, Independent with gait, Independent with transfers (pt's daughter reports before Nov 2022, pt was independent but has declined since)  Able to Take Stairs?: No Driving: No Vision Baseline Vision/History: 1 Wears glasses Ability to See in Adequate Light: 0 Adequate Patient Visual Report: No change from baseline Vision Assessment?: No apparent visual deficits Perception  Perception: Within Functional Limits Praxis Praxis: Impaired Praxis Impairment Details: Initiation;Motor planning Cognition Overall Cognitive Status: Impaired/Different from baseline Arousal/Alertness: Lethargic Orientation Level: Person;Place;Situation Person: Oriented Place: Disoriented Situation: Disoriented Year: Other (Comment) (2002) Month: December Day of Week:  Incorrect Memory: Impaired Immediate Memory Recall: Sock;Blue;Bed Memory Recall Sock: Not able to recall Memory Recall Blue: Not able to recall Memory Recall Bed: Not able to recall Awareness: Impaired Problem Solving: Impaired Safety/Judgment: Impaired Sensation Sensation Light Touch: Appears Intact Coordination Gross Motor Movements are Fluid and Coordinated: No Fine Motor Movements are Fluid and Coordinated: No Finger Nose Finger Test: Dysmetric on BUE, increased time Motor  Motor Motor: Other (comment) (Significant R trunk lean while seated) Motor - Skilled Clinical Observations: Limited by severe lethargy and disorientation on eval  Trunk/Postural Assessment  Cervical Assessment Cervical Assessment: Exceptions to [facility] (Forward head) Thoracic Assessment Thoracic Assessment: Exceptions to [facility] (Rounded shoulders, R trunk lean) Lumbar Assessment Lumbar Assessment: Exceptions to [facility] (Posterior pelvic tilt) Postural Control Postural Control: Deficits on evaluation Head Control: Delayed Trunk Control: Delayed Righting Reactions: Delayed Protective Responses: Absent  Balance Balance Balance Assessed: Yes Static Sitting Balance Static Sitting - Balance Support: Feet supported;Bilateral upper extremity supported Static Sitting - Level of Assistance: 5: Stand by assistance Dynamic Sitting Balance Dynamic Sitting - Balance Support: Feet supported;No upper extremity supported;During functional activity Dynamic Sitting - Level of Assistance: 4: Min assist Dynamic Sitting - Balance Activities: Forward lean/weight shifting;Reaching across midline;Lateral lean/weight shifting;Reaching for objects Static Standing Balance Static Standing - Balance Support: During functional activity;Bilateral upper extremity supported Static Standing - Level of Assistance: 3: Mod assist Dynamic Standing Balance Dynamic Standing - Balance Support: During functional activity;Bilateral upper extremity  supported Dynamic Standing - Level of Assistance: 3: Mod assist Dynamic Standing - Balance Activities: Reaching across midline;Forward lean/weight shifting;Reaching for objects Extremity/Trunk Assessment RUE Assessment RUE Assessment: Exceptions to [facility] Active Range of Motion (AROM) Comments: limited <90 during self-care tasks General Strength Comments: RUE 4/5 except R hand- grip 3-/5 LUE Assessment LUE Assessment: Within Functional Limits General Strength Comments: WFL for self-care tasks  Care Tool Care Tool Self Care Eating   Eating Assist Level: Set up assist  Oral Care    Oral Care Assist Level: Set up assist    Bathing   Body parts bathed by patient: Right arm;Left arm;Chest;Abdomen;Right upper leg;Left upper leg;Face Body parts bathed by helper: Front perineal area;Buttocks;Right lower leg;Left lower leg   Assist Level: Moderate Assistance - Patient 50 - 74%    Upper Body Dressing(including orthotics)   What is the patient wearing?: Pull over shirt   Assist Level: Minimal Assistance - Patient > 75%    Lower Body Dressing (excluding footwear)   What is the patient wearing?: Pants Assist for lower body dressing: Maximal Assistance - Patient 25 - 49%    Putting on/Taking off footwear   What is the patient wearing?: Non-skid slipper socks Assist for footwear: Total Assistance - Patient < 25%       Care Tool Toileting Toileting activity   Assist for toileting: Total Assistance - Patient < 25%     Care Tool Bed Mobility Roll left and right activity        Sit to lying activity        Lying to sitting on side of bed activity         Care Tool Transfers Sit to stand transfer        Chair/bed transfer         Toilet transfer   Assist Level: Moderate Assistance - Patient 50 - 74%     Care Tool Cognition  Expression of Ideas and Wants Expression of Ideas and Wants: 2. Frequent difficulty - frequently exhibits difficulty with expressing needs and  ideas  Understanding Verbal and Non-Verbal Content Understanding Verbal and Non-Verbal Content: 2. Sometimes understands - understands only basic conversations or simple, direct phrases. Frequently requires cues to understand   Memory/Recall Ability Memory/Recall Ability : Current season   Refer to Care Plan for Long Term Goals  SHORT TERM GOAL WEEK 1 OT Short Term Goal 1 (Week 1): Pt will sit > stand in prep for functional task with LRAD and min A OT Short Term Goal 2 (Week 1): Pt will donn pants with mod A OT Short Term Goal 3 (Week 1): Pt will complete toilet transfer with min A  Recommendations for other services: None    Skilled Therapeutic Intervention Skilled OT intervention completed with discussion on POC, rehab goals and explanation of OT purpose. Pt received supine in bed, requiring multimodal cues to wake but agreeable to session. Pt completed bed mobility with HOB slightly elevated with min A, and minor R lean throughout. Pt completed bathing with Max A, UBD with min A, and LBD with Mod-Max A with pt able to complete sit > stand using RW with mod A for power up for therapist to donn pants over hips. Mod A needed for standing balance due to R lean. Attempted sit > stand without AD with pt at mod A level and heavy R lean due to Stand pivot transfer to recliner with mod A using RW. See caretool for further details on assist level with self-care tasks performed. Pt left seated in recliner with belt alarm on and activated, with set up A needed for breakfast and all other needs in reach at end of session.   ADL ADL Eating: Set up Where Assessed-Eating: Chair Grooming: Setup Where Assessed-Grooming: Chair Upper Body Bathing: Minimal assistance Where Assessed-Upper Body Bathing: Edge of bed Lower Body Bathing: Maximal assistance Where Assessed-Lower Body Bathing: Edge of bed Upper Body Dressing: Minimal assistance Where Assessed-Upper Body Dressing: Edge of bed Lower  Body Dressing:  Maximal assistance Where Assessed-Lower Body Dressing: Edge of bed Toileting: Maximal assistance Where Assessed-Toileting: Bedside Commode Toilet Transfer: Moderate assistance Toilet Transfer Method: Stand pivot (RW) Toilet Transfer Equipment: Bedside commode Tub/Shower Transfer: Unable to assess Tub/Shower Transfer Method: Unable to assess [employer] Transfer: Unable to assess [employer] Transfer Method: Unable to assess Mobility  Bed Mobility Bed Mobility: Rolling Right;Rolling Left;Sit to Supine Rolling Right: Minimal Assistance - Patient > 75% Rolling Left: Minimal Assistance - Patient > 75% Sit to Supine: Minimal Assistance - Patient > 75% Transfers Sit to Stand: Maximal Assistance - Patient 25-49% Stand to Sit: Moderate Assistance - Patient 50-74%   Discharge Criteria: Patient will be discharged from OT if patient refuses treatment 3 consecutive times without medical reason, if treatment goals not met, if there is a change in medical status, if patient makes no progress towards goals or if patient is discharged from hospital.  The above assessment, treatment plan, treatment alternatives and goals were discussed and mutually agreed upon: by patient  Izaac Reisig E Lynnmarie Lovett 05/14/2021, 9:11 AM

## 2021-05-15 LAB — GLUCOSE, CAPILLARY
Glucose-Capillary: 125 mg/dL — ABNORMAL HIGH (ref 70–99)
Glucose-Capillary: 268 mg/dL — ABNORMAL HIGH (ref 70–99)
Glucose-Capillary: 153 mg/dL — ABNORMAL HIGH (ref 70–99)
Glucose-Capillary: 205 mg/dL — ABNORMAL HIGH (ref 70–99)

## 2021-05-15 MED ORDER — FERROUS SULFATE 325 (65 FE) MG PO TABS
325.0000 mg | ORAL_TABLET | Freq: Every day | ORAL | Status: DC
  Administered 2021-05-16 – 2021-05-28 (×13): 325 mg via ORAL
  Filled 2021-05-15 (×13): qty 1

## 2021-05-15 NOTE — Progress Notes (Signed)
PROGRESS NOTE   Subjective/Complaints: No new complaints this morning.  Hgb 5.9- stable, Jehovah's witness so will not transfuse Missed 61 min therapy today due to fatigue Denies pain  ROS: Unable to obtain given cognitive deficits   Objective:   No results found. Recent Labs    05/13/21 1732 05/14/21 0543  WBC 3.3* 2.8*  HGB 6.1* 5.9*  HCT 19.2* 18.2*  PLT 272 294   Recent Labs    05/13/21 1732 05/14/21 0543  NA  --  144  K  --  4.8  CL  --  113*  CO2  --  21*  GLUCOSE  --  171*  BUN  --  26*  CREATININE 1.91* 1.79*  CALCIUM  --  8.2*    Intake/Output Summary (Last 24 hours) at 05/15/2021 1242 Last data filed at 05/15/2021 0600 Gross per 24 hour  Intake 160 ml  Output 2350 ml  Net -2190 ml     Pressure Injury 05/05/21 Buttocks Right Stage 1 -  Intact skin with non-blanchable redness of a localized area usually over a bony prominence. small open area on right buttocks (Active)  05/05/21 1700  Location: Buttocks  Location Orientation: Right  Staging: Stage 1 -  Intact skin with non-blanchable redness of a localized area usually over a bony prominence.  Wound Description (Comments): small open area on right buttocks  Present on Admission:     Physical Exam: Vital Signs Blood pressure (!) 128/55, pulse 74, temperature (!) 97.5 F (36.4 C), temperature source Oral, resp. rate 16, SpO2 95 %. Gen: no distress, normal appearing HEENT: oral mucosa pink and moist, NCAT Cardio: Reg rate Chest: normal effort, normal rate of breathing Abd: soft, non-distended Ext: no edema Psych: pleasant, normal affect Skin: intact Musculoskeletal:     Cervical back: Normal range of motion. No rigidity.     Comments: LUE/LLE 5-/5 RUE 4/5 except R hand- grip 3-/5 and FA 2-/5- has hx of chronic R hand damage- held open - not using- except as secondary support when eating RLE_ 4+/5 in HF/KE/KF/DF and PF  Skin:     General: Skin is warm and dry.     Comments: SPC and Perc drain as above IV's in L AC fossa and R wrist- look OK  Neurological:     Comments: Patient is alert.  Makes eye contact with examiner.  Provides name and age.  Follows commands.  Limited but fair medical historian. HOH- but making some jokes- Ox3 upon questioning Sensation to light touch intact x4 except R hand  Psychiatric:        Mood and Affect: Mood normal.        Behavior: Behavior normal.    Assessment/Plan: 1. Functional deficits which require 3+ hours per day of interdisciplinary therapy in a comprehensive inpatient rehab setting. Physiatrist is providing close team supervision and 24 hour management of active medical problems listed below. Physiatrist and rehab team continue to assess barriers to discharge/monitor patient progress toward functional and medical goals  Care Tool:  Bathing    Body parts bathed by patient: Right arm, Left arm, Chest, Abdomen, Right upper leg, Left upper leg, Face   Body parts  bathed by helper: Front perineal area, Buttocks, Right lower leg, Left lower leg     Bathing assist Assist Level: Moderate Assistance - Patient 50 - 74%     Upper Body Dressing/Undressing Upper body dressing   What is the patient wearing?: Pull over shirt    Upper body assist Assist Level: Minimal Assistance - Patient > 75%    Lower Body Dressing/Undressing Lower body dressing      What is the patient wearing?: Incontinence brief, Pants     Lower body assist Assist for lower body dressing: Maximal Assistance - Patient 25 - 49%     Toileting Toileting    Toileting assist Assist for toileting: Dependent - Patient 0%     Transfers Chair/bed transfer  Transfers assist     Chair/bed transfer assist level: Moderate Assistance - Patient 50 - 74%     Locomotion Ambulation   Ambulation assist   Ambulation activity did not occur: Safety/medical concerns (Severe lethargy, global  deconditioning)          Walk 10 feet activity   Assist  Walk 10 feet activity did not occur: Safety/medical concerns (Severe lethargy, global deconditioning)        Walk 50 feet activity   Assist Walk 50 feet with 2 turns activity did not occur: Safety/medical concerns (Severe lethargy, global deconditioning)         Walk 150 feet activity   Assist Walk 150 feet activity did not occur: Safety/medical concerns (Severe lethargy, global deconditioning)         Walk 10 feet on uneven surface  activity   Assist Walk 10 feet on uneven surfaces activity did not occur: Safety/medical concerns (Severe lethargy, global deconditioning)         Wheelchair     Assist Is the patient using a wheelchair?: Yes Type of Wheelchair: Manual    Wheelchair assist level: Dependent - Patient 0%      Wheelchair 50 feet with 2 turns activity    Assist        Assist Level: Dependent - Patient 0%   Wheelchair 150 feet activity     Assist      Assist Level: Dependent - Patient 0%   Blood pressure (!) 128/55, pulse 74, temperature (!) 97.5 F (36.4 C), temperature source Oral, resp. rate 16, SpO2 95 %.  Medical Problem List and Plan: 1. 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  Continue CIR 2.  Impaired mobility: continue Subcutaneous heparin q8H.             -antiplatelet therapy: Aspirin 81 mg daily and Plavix 75 mg day x3 weeks then Plavix alone 3. Pain: continue Tylenol as needed 4. Mood: Provide emotional support             -antipsychotic agents: N/A 5. Neuropsych: This patient is capable of making decisions on his own behalf. 6. Skin/Wound Care: Routine skin checks 7. Fluids/Electrolytes/Nutrition: Routine in and outs with  follow-up chemistries 8.  Hypertension.  Decrease Coreg to 9.275mg  BID given bradycardia below. Increase hydralazine to 50 mg every 8 hours.  9.  History of CAD with CABG.  Continue Imdur 60 mg twice daily 10.  COVID-19 pneumonia.  Treated with remdesivir and steroid.  Isolation discontinued 12/23 11.  Symptomatic anemia.  Declines blood product due to Jehovah witness..  Continue Aranesp, Hgb 5.9- continue to monitor as needed 12.  Diabetes mellitus.  Hemoglobin A1c 8.5.  SSI.  Patient on Glucophage 500 mg twice daily as well as Amaryl 4 mg daily prior to admission.  Resume as needed 13.  Percutaneous cholecystostomy drain malfunction.  Patient hospitalized [facility] in October for cholecystitis.  Continue Cardura.  Not a surgical candidate status post percutaneous cholecystostomy drain. 14.  Hyperlipidemia.  Zetia 15.  Pseudomonas UTI.  Completing course of IV Fortaz. 16. Constipation- LBM 1 week ago- will give sorbitol and monitor.  17. Bradycardia: decrease coreg to 9.375mg  BID    LOS: 2 days A FACE TO FACE EVALUATION WAS PERFORMED  [name] 05/15/2021, 12:42 PM

## 2021-05-15 NOTE — Progress Notes (Signed)
Occupational Therapy Session Note  Patient Details  Name: [name] MRN: [id number] Date of Birth: May 26, 1930  Today's Date: 05/15/2021 OT Individual Time: 0704-0800 OT Individual Time Calculation (min): 56 min    Short Term Goals: Week 1:  OT Short Term Goal 1 (Week 1): Pt will sit > stand in prep for functional task with LRAD and min A OT Short Term Goal 2 (Week 1): Pt will donn pants with mod A OT Short Term Goal 3 (Week 1): Pt will complete toilet transfer with min A  Skilled Therapeutic Interventions/Progress Updates:    Pt received sitting up in bed finishing breakfast. No c/o pain. RN alerted OT to Hgb of 5.9. Pt baseline with low hgb and refusal of blood products so proceeded with session with frequent rest breaks. Pt had surprisingly little c/o of dizziness/fatigue throughout session and overall had great performance compared to yesterdays eval. He transitioned to EOB with mod A and mod cueing for body mechanics, positioning, and initiation. He was oriented to place with min questioning cues but not year or month. EOB he demonstrated a lateral lean with R scapular elevation and trunk lengthening, able to correct moderately with cueing and tactile assist. He completed UB bathing with (S) while maintaining static sitting balance with close (S). He completed 3x sit <> stand for LB bathing/dressing with mod A initially, fading to min A. He was able to maintain static and dynamic standing balance with min A while he completed anterior and posterior peri hygiene. He completed a stand pivot transfer with the RW with min A. From the recliner he worked on [employer] with hand prehension, finger flexion, forearm supination/pronation, and force modulation. Pt was left sitting up with all needs met, chair alarm set.   Therapy Documentation Precautions:  Precautions Precautions: Fall Restrictions Weight Bearing Restrictions: No  Therapy/Group: Individual Therapy  Curtis Sites 05/15/2021,  6:04 AM

## 2021-05-15 NOTE — Progress Notes (Signed)
Palliative:  Consult received and chart reviewed. Saw patient - he seems lethargic, tells me he is too tired to talk. Tells me it is okay to call his daughter. Called daughter at both numbers listed - no answer, left voicemail with call back number. Will await call back and follow up tomorrow for [place] discussion.   Juel Burrow, DNP, AGNP-C Palliative Medicine Team Team Phone # [phone]  Pager # [phone]  NO CHARGE

## 2021-05-15 NOTE — Progress Notes (Signed)
SLP Cancellation Note  Patient Details Name: [name] MRN: [id number] DOB: [birth year]   Cancelled treatment:        Patient in bed and asleep when SLP arrived for session. He awoke to voice and did respond to a couple of SLP's questions however very difficult to understand secondary to speech intelligibility and decreased vocal intensity. Patient quickly fell back asleep and SLP unable to achieve adequate arousal and alertness for skilled ST session. Patient missed 45 minutes ST tx.                                                                                            Sonia Baller, MA, CCC-SLP Speech Therapy

## 2021-05-15 NOTE — Progress Notes (Signed)
Physical Therapy Session Note  Patient Details  Name: [name] MRN: [id number] Date of Birth: 16-Feb-1931  Today's Date: 05/15/2021 PT Individual Time: 1000-1014 PT Individual Time Calculation (min): 14 min   Today's Date: 05/15/2021 PT Missed Time: 61 Minutes Missed Time Reason: Patient fatigue  Short Term Goals: Week 1:  PT Short Term Goal 1 (Week 1): Pt will perform sit <>stand w/mod A and LRAD PT Short Term Goal 2 (Week 1): Pt will ambulate 25' w/LRAD and mod A PT Short Term Goal 3 (Week 1): Pt will perform bed <>chair transfers w/LRAD and mod A PT Short Term Goal 4 (Week 1): Pt will demonstrate sustained attention to task for 2 mintues w/S* and min verbal cues  Skilled Therapeutic Interventions/Progress Updates:   Received pt semi-reclined in bed asleep. Upon wakening, pt unable to sustain arousal long enough to answer therapist's questions but did not c/o pain. Per RN, pt's hemoglobin 5.9 (MD aware) and RN questing therapist "take it easy" on pt. Upon returning to room, encouraged sitting EOB however, pt so lethargic he was unable to follow any cues. Will attempt to make up time as able. Concluded session with pt semi-reclined in bed asleep with all needs within reach. 61 minutes missed of skilled physical therapy due to fatigue.   Therapy Documentation Precautions:  Precautions Precautions: Fall Restrictions Weight Bearing Restrictions: No  Therapy/Group: Individual Therapy Alfonse Alpers PT, DPT   05/15/2021, 7:28 AM

## 2021-05-16 DIAGNOSIS — Z66 Do not resuscitate: Secondary | ICD-10-CM

## 2021-05-16 DIAGNOSIS — Z515 Encounter for palliative care: Secondary | ICD-10-CM

## 2021-05-16 DIAGNOSIS — Z7189 Other specified counseling: Secondary | ICD-10-CM

## 2021-05-16 LAB — GLUCOSE, CAPILLARY
Glucose-Capillary: 168 mg/dL — ABNORMAL HIGH (ref 70–99)
Glucose-Capillary: 243 mg/dL — ABNORMAL HIGH (ref 70–99)
Glucose-Capillary: 193 mg/dL — ABNORMAL HIGH (ref 70–99)
Glucose-Capillary: 215 mg/dL — ABNORMAL HIGH (ref 70–99)

## 2021-05-16 MED ORDER — CHLORHEXIDINE GLUCONATE CLOTH 2 % EX PADS
6.0000 | MEDICATED_PAD | Freq: Two times a day (BID) | CUTANEOUS | Status: DC
  Administered 2021-05-16 – 2021-05-28 (×24): 6 via TOPICAL

## 2021-05-16 NOTE — Progress Notes (Signed)
Physical Therapy Session Note  Patient Details  Name: [name] MRN: [id number] Date of Birth: 1931/04/13  Today's Date: 05/16/2021 PT Individual Time: 1188-6773 PT Individual Time Calculation (min): 25 min   Short Term Goals: Week 1:  PT Short Term Goal 1 (Week 1): Pt will perform sit <>stand w/mod A and LRAD PT Short Term Goal 2 (Week 1): Pt will ambulate 25' w/LRAD and mod A PT Short Term Goal 3 (Week 1): Pt will perform bed <>chair transfers w/LRAD and mod A PT Short Term Goal 4 (Week 1): Pt will demonstrate sustained attention to task for 2 mintues w/S* and min verbal cues  Skilled Therapeutic Interventions/Progress Updates: Pt presented in w/c sleeping but easily aroused and agreeable to therapy. Pt denies pain during session. Session focused on standing activities with sustained task. Pt transported to 4th floor ortho gym and participated in [place] activities. Pt required minA for Sit to stand consistently from w/c. Pt first participated in [place] task with alphabet requiring x 3 cues during activity for correct sequential letter. Pt was able to complete task in 2:58 with 78% accuracy (hits). Pt also participated in reaction activity with 100% accuracy for 1 min bouts. Pt transferred back to room at end of session and remained in w/c awaiting ST session with pt's friends present and current needs met.      Therapy Documentation Precautions:  Precautions Precautions: Fall Restrictions Weight Bearing Restrictions: No General:   Vital Signs: Therapy Vitals Temp: (!) 97.5 F (36.4 C) Pulse Rate: 67 Resp: 16 BP: (!) 127/57 Patient Position (if appropriate): Sitting Oxygen Therapy SpO2: 98 % O2 Device: Room Air Pain: Pain Assessment Pain Scale: 0-10 Pain Score: 0-No pain Mobility:   Locomotion :    Trunk/Postural Assessment :    Balance:   Exercises:   Other Treatments:      Therapy/Group: Individual Therapy  Quinlan Vollmer 05/16/2021, 4:04 PM

## 2021-05-16 NOTE — Consult Note (Signed)
Consultation Note Date: 05/16/2021   Patient Name: [name]  DOB: 1930-11-23  MRN: [id number]  Age / Sex: 87 y.o., male  PCP: Suzan Garibaldi, FNP Referring Physician: Izora Ribas, MD  Reason for Consultation: Establishing goals of care  HPI/Patient Profile: 87 y.o. male  with past medical history of AAA, OSA, chronic anemia, CAD with CABG, CVA, chronic combined CHF, diabetes mellitus, hypertension, hyperlipidemia, and recent cholecystostomy tube placement admitted to [facility] 05/13/2021. Patient hospitalization 03/15/21-03/21/21 for acute cholecystitis. He was treated with Zosyn and percutaneous drain was placed. Presented 04/29/2021 with increasing cough/fatigue and generalized weakness.  CT of the abdomen pelvis showed inflammatory changes in gallbladder consistent with cholecystitis. Patient found to have hgb 5.8, urine positive for pseudomonas, and covid positive. On 05/05/2021 patient had increasing confusion and MRI of the brain showed 3 mm acute infarct within the posterior left frontal lobe white matter. PMT consulted for advance care planning.   Clinical Assessment and Goals of Care: I have reviewed medical records including EPIC notes, labs and imaging, assessed the patient and then met with patient's daughter and nephew to discuss diagnosis prognosis, GOC, EOL wishes, disposition and options.  I introduced Palliative Medicine as specialized medical care for people living with serious illness. It focuses on providing relief from the symptoms and stress of a serious illness. The goal is to improve quality of life for both the patient and the family.  Daughter asked that we speak privately as patient currently working with therapy and with some cognitive impairment and fatigue.   Family tells me that up until Oct 30, 2022 of this year patient had been providing total care for his wife and was very active - his wife of 10 years passed away in  10-30-22. Family believes his grief over her death has contributed to his decline. Daughter tells me she had been living with him recently due to his decline. Tells me he was eating well and was using a walker for ambulation. She tells me he recently fell twice. She tells me he was able to complete most ADLs independently. She tells me he was spending more time sleeping.    We discussed patient's current illness and what it means in the larger context of patient's on-going co-morbidities.  Natural disease trajectory and expectations at EOL were discussed. We discussed his recent infection, drain placement, treatment with medications, low hgb, and stroke. Discussed his recent fatigue limiting his ability to participate in therapy though it does seem that he is participating much more today.   I attempted to elicit values and goals of care important to the patient.    The difference between aggressive medical intervention and comfort care was considered in light of the patient's goals of care.   Advance directives, concepts specific to code status, artificial feeding and hydration, and rehospitalization were considered and discussed.  I completed a MOST form today. The patient and family outlined their wishes for the following treatment decisions:  Cardiopulmonary Resuscitation: Do Not Attempt Resuscitation (DNR/No CPR)  Medical Interventions: Limited Additional Interventions: Use medical treatment, IV fluids and cardiac monitoring as indicated, DO NOT USE intubation or mechanical ventilation. May consider use of less invasive airway support such as BiPAP or CPAP. Also provide comfort measures. Transfer to the hospital if indicated. Avoid intensive care.   Antibiotics: Antibiotics if indicated  IV Fluids: IV fluids if indicated  Feeding Tube: Feeding tube for a defined trial period   Family also shares that is is very important to the  patient to NOT receive blood products.   Discussed with family the  importance of continued conversation with family and the medical providers regarding overall plan of care and treatment options, ensuring decisions are within the context of the patients values and GOCs.    Discuss with family current plan to continue course with rehab and see how patient progresses. Discuss that if he begins to have more days where he is unable to participate with therapy we will need further discussion about goals of care. Briefly discussed hospice support if patient appears to be nearing end of life. Family expresses understanding.   Questions and concerns were addressed. The family was encouraged to call with questions or concerns.   Primary Decision Maker NEXT OF KIN - daughter Sadie Haber - patient may be able to better participate in decision making if/when mental status improves   SUMMARY OF RECOMMENDATIONS   - continue CIR - family hopeful for improvement in functional status - MOST completed as above and copy placed in chart - discussed with family we will follow along, if patient has decline or becomes unable to participate with therapy regularly we will have further discussion about goals but seems today he is more alert and interactive with more endurance  Code Status/Advance Care Planning: DNR     Primary Diagnoses: Present on Admission:  Lacunar infarction (HCC)  CKD (chronic kidney disease) stage 3, GFR 30-59 ml/min (HCC)  DNR (do not resuscitate)  Chronic anemia   I have reviewed the medical record, interviewed the patient and family, and examined the patient. The following aspects are pertinent.  Past Medical History:  Diagnosis Date   Abdominal aortic aneurysm    a. Aortic duplex 06/2014: mild aneurysmal dilatation of proximal abdominal aorta measuring 3.4x3.4cm. No sig change from 2012. F/u due 06/2016;    Arthritis    "right knee; never bothered me" (03/26/2016)   Balanitis xerotica obliterans    with meatal stenosis and distal stricture   Carpal  tunnel syndrome of right wrist 06/24/2015   Chronic anemia 04/17/2018   Chronic combined systolic and diastolic CHF (congestive heart failure) (HCC)    Coronary artery disease involving coronary bypass graft of native heart with angina pectoris (HCC) 09/19/2014   Cath for Inf STEMI 03/27/16  Conclusions: Significant native coronary artery disease, including 70% ostial LAD stenosis, 50% mid LCx lesion, and 99% proximal RCA disease with TIMI-1 flow (chronic per prior cath reports). Patent LIMA to LAD. Acutely occluded SVG to PDA within previously placed stent. 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 (03/26/2016)   Stenosis of left carotid artery    Suprapubic catheter (HCC)    Vitamin D deficiency 05/27/2018   Social History   Socioeconomic History   Marital status: Married    Spouse name: Lucendia Herrlich   Number of children: 1   Years of education: Not on file  Highest education level: Not on file  Occupational History    Employer: RETIRED  Tobacco Use   Smoking status: Former    Packs/day: 0.33    Years: 10.00    Pack years: 3.30    Types: Cigarettes    Quit date: 1961    Years since quitting: 62.0   Smokeless tobacco: Never  Vaping Use   Vaping Use: Never used  Substance and Sexual Activity   Alcohol use: No   Drug use: No   Sexual activity: Never  Other Topics Concern   Not on file  Social  History Narrative   Married father of one.  Previously uses stationary bike routinely.  Now reduced due to other social stressors.   Quit smoking 50 years ago.  Does not drink alcohol   Social Determinants of Radio broadcast assistant Strain: Not on file  Food Insecurity: Not on file  Transportation Needs: Not on file  Physical Activity: Not on file  Stress: Not on file  Social Connections: Not on file   Family History  Problem Relation Age of Onset   Heart Problems Father 46   Scheduled Meds:  amLODipine  10 mg Oral Q1500   vitamin C  500 mg Oral Daily   aspirin  81 mg Oral Daily   carvedilol  9.375 mg Oral BID WC   clopidogrel  75 mg Oral Daily   darbepoetin (ARANESP) injection - NON-DIALYSIS  150 mcg Subcutaneous Q Wed-1800   doxazosin  4 mg Oral Daily   ezetimibe  10 mg Oral Daily   ferrous sulfate  325 mg Oral Q breakfast   fluticasone  2 spray Each Nare Daily   folic acid  1 mg Oral Daily   Gerhardt's butt cream   Topical BID   guaiFENesin  600 mg Oral BID   heparin  5,000 Units Subcutaneous Q8H   hydrALAZINE  50 mg Oral Q8H   insulin aspart  0-20 Units Subcutaneous TID WC   isosorbide mononitrate  60 mg Oral BID   pantoprazole  40 mg Oral Daily   polyethylene glycol  17 g Oral Daily   saccharomyces boulardii  250 mg Oral BID   senna-docusate  1 tablet Oral BID   sodium bicarbonate  650 mg Oral BID   vitamin B-12  1,000 mcg Oral Daily   zinc sulfate  220 mg Oral Daily   Continuous Infusions: PRN Meds:.acetaminophen, albuterol, bisacodyl, chlorpheniramine-HYDROcodone, guaiFENesin-dextromethorphan, ondansetron **OR** ondansetron (ZOFRAN) IV Allergies  Allergen Reactions   Atorvastatin Other (See Comments)    Myalgias   Crestor [Rosuvastatin] Other (See Comments)    Myalgias    Other Other (See Comments)    No  BLOOD PRODUCTS - Pt is Jehovah's Witness   Pravastatin Other (See Comments)    Myalgias   Simvastatin Other (See Comments)    Myalgias    Statins  Other (See Comments)    Cause muscle aches   Review of Systems  Unable to perform ROS  Physical Exam Constitutional:      General: He is not in acute distress. Pulmonary:     Effort: Pulmonary effort is normal.  Skin:    General: Skin is warm and dry.  Neurological:     Mental Status: He is alert.     Comments: Some confusion noted but seems much more oriented today    Vital Signs: BP (!) 127/57    Pulse 67    Temp (!) 97.5 F (36.4 C)  Resp 16    SpO2 98%  Pain Scale: 0-10   Pain Score: 0-No pain   SpO2: SpO2: 98 % O2 Device:SpO2: 98 % O2 Flow Rate: .   IO: Intake/output summary:  Intake/Output Summary (Last 24 hours) at 05/16/2021 1611 Last data filed at 05/16/2021 1450 Gross per 24 hour  Intake 869 ml  Output 1425 ml  Net -556 ml    LBM: Last BM Date: 05/15/21 Baseline Weight:   Most recent weight:       Palliative Assessment/Data: PPS 50%   Flowsheet Rows    Flowsheet Row Most Recent Value  Intake Tab   Referral Department --  [CIR]  Unit at Time of Referral Other (Comment)  [CIR]  Palliative Care Primary Diagnosis Neurology  Date Notified 05/14/21  Palliative Care Type New Palliative care  Reason for referral Clarify Goals of Care  Date of Admission 05/13/21  Date first seen by Palliative Care 05/15/21  # of days Palliative referral response time 1 Day(s)  # of days IP prior to Palliative referral 1  Clinical Assessment   Psychosocial & Spiritual Assessment   Palliative Care Outcomes       Time Total: 80 minutes Greater than 50%  of this time was spent counseling and coordinating care related to the above assessment and plan.  Juel Burrow, DNP, AGNP-C Palliative Medicine Team [phone] Pager: [phone]

## 2021-05-16 NOTE — Progress Notes (Signed)
Occupational Therapy Session Note  Patient Details  Name: [name] MRN: [id number] Date of Birth: 11-Oct-1930  Today's Date: 05/17/2021 OT Individual Time: 7129-2909 OT Individual Time Calculation (min): 54 min    Short Term Goals: Week 1:  OT Short Term Goal 1 (Week 1): Pt will sit > stand in prep for functional task with LRAD and min A OT Short Term Goal 2 (Week 1): Pt will donn pants with mod A OT Short Term Goal 3 (Week 1): Pt will complete toilet transfer with min A  Skilled Therapeutic Interventions/Progress Updates:    Pt greeted in bed, in care of RN. Pt agreeable to engage in BADLs. After OT gathered necessary supplies and picked out his clothing, pt reported that he had already bathed this morning. "A sister from [place]" had assisted him. Toileting needs were reportedly met. Pt was motivated to ambulate. Pt completed supine<sit from flat bed without bedrail with setup however noted that he used the armrest of the neighboring recliner for leverage. Min A for sit<stand from elevated bed using RW and CGA for stand pivot<w/c. Pt was then escorted [facility] to increase environmental stimulation and positively impact his alertness. Pt ambulated a few bouts in the food court area using RW with CGA-Min A. Vcs for sit<stands. Note that pt has very limited standing endurance and attempted to sit in other seats despite OT having close w/c follow behind him. Escorted pt to an outdoor area and we had a meaningful discussion regarding basic orientation. Pt with very poor retention of education after we reviewed it multiple times. He self propelled the w/c underneath the awning with assistance for positioning the Rt hand on the rim of his wheel. Mod A overall for maintaining straight path. Pt was then returned to his room and remained sitting up in the w/c, left with all needs within reach and safety belt fastened. Tx focus placed on activity tolerance,  dynamic balance, and functional cognition.   Therapy Documentation Precautions:  Precautions Precautions: Fall Restrictions Weight Bearing Restrictions: No Vital Signs: Therapy Vitals Temp: 97.9 F (36.6 C) Pulse Rate: 62 Resp: 16 BP: (!) 138/53 Patient Position (if appropriate): Lying Oxygen Therapy SpO2: 94 % O2 Device: Room Air Pain: pt denied pain during tx   ADL: ADL Eating: Set up Where Assessed-Eating: Chair Grooming: Setup Where Assessed-Grooming: Chair Upper Body Bathing: Minimal assistance Where Assessed-Upper Body Bathing: Edge of bed Lower Body Bathing: Maximal assistance Where Assessed-Lower Body Bathing: Edge of bed Upper Body Dressing: Minimal assistance Where Assessed-Upper Body Dressing: Edge of bed Lower Body Dressing: Maximal assistance Where Assessed-Lower Body Dressing: Edge of bed Toileting: Maximal assistance Where Assessed-Toileting: Bedside Commode Toilet Transfer: Moderate assistance Toilet Transfer Method: Stand pivot (RW) Toilet Transfer Equipment: Bedside commode Tub/Shower Transfer: Unable to assess Tub/Shower Transfer Method: Unable to assess [employer] Transfer: Unable to assess [employer] Method: Unable to assess  Therapy/Group: Individual Therapy  Takeshi Teasdale A Glendene Wyer 05/17/2021, 3:06 PM

## 2021-05-16 NOTE — Progress Notes (Signed)
Palliative:  No call back from daughter. Visited patient again - no family at bedside but he does seem to be more alert today - though some cognitive impairment noted during brief conversation - suspect goals of care conversations are best with family present as well. Patient does tell me his daughter works during the week so her availability may be better over weekend.   Call to daughter with no answer - voicemail with call back number left.   Will ask my teammates to follow up over weekend since daughter may have increased availability.  Juel Burrow, DNP, AGNP-C Palliative Medicine Team Team Phone # [phone]  Pager # [phone]  NO CHARGE

## 2021-05-16 NOTE — Progress Notes (Signed)
Occupational Therapy Session Note  Patient Details  Name: [name] MRN: [id number] Date of Birth: Jul 17, 1930  Today's Date: 05/16/2021 OT Individual Time: 7616-0737 & 1062-6948 OT Individual Time Calculation (min): 68 min & 38 min    Short Term Goals: Week 1:  OT Short Term Goal 1 (Week 1): Pt will sit > stand in prep for functional task with LRAD and min A OT Short Term Goal 2 (Week 1): Pt will donn pants with mod A OT Short Term Goal 3 (Week 1): Pt will complete toilet transfer with min A  Skilled Therapeutic Interventions/Progress Updates:  Session 1 Skilled OT intervention completed with focus on ADL retraining, activity tolerance, cognitive reorientation, functional transfers. Pt received upright in bed, asleep but easy to alert and agreeable to session. Pt oriented x2 this session, with pt aware of where he is. Bed mobility for long sitting > EOB with min A for trunk control. While EOB, pt doffing dirty shirt with CGA for sitting balance, then completed UB bathing with close supervision, then LB bathing with CGA however pt demonstrating ability to lean forward for reaching BLEs. Pt noted to be SOB, however resolved with increased time throughout ADLs for energy conservation. Education provided on energy conservation strategies provided. Pt donned new shirt with supervision, demonstrating ability to orient folded shirt for correct positioning for wearing. Required min A for threading BLE into pants. Sit > stand with mod A using RW then min A needed to donn pants over hips. Stand pivot using RW with min A to w/c. Donned TEDs per MD order with total A for time management. Pt educated on firgure 4 leg positioning for donning socks with pt able to complete on RLE only with min A for donning socks. Pt report with need to attempt BM. Short ambulatory transfer using RW with min A to BSC over toilet, with total A for doffing pants over hips. Pt with successful BM, LPN notified for charting  purposes. Pt sit > stand using RW with mod A, with pt able to maintain balance with CGA for total A pericare. Ambulatory transfer to recliner, with min A using RW. Pt expressing that having someone wipe him was new to him, with pt expressing increased orientation into his CLOF, recognizing he needs assistance and didn't before. Educated pt on OT purpose and rehab goals, with plan to increase independence with all self-care tasks. Pt left seated in recliner, with LPN in room, belt alarm on, and all needs in reach at end of session.  Session 2 Skilled OT intervention completed with focus on increasing arousal/orientation, activity tolerance, functional stands, and dynamic balance. Pt received seated in recliner, asleep- easily woken. Pt expressing that he falls asleep often, with window shade down and noticeably dark in room. Therapist lifted window shade for increased environmental stimuli, to promote increased arousal and orientation with time of day. Completed sit > stand with mod-max A using RW with pt using bilateral knee extension onto chair to stand. Stand pivot using RW, with min A to w/c. Transported with total A for energy conservation to [employer] for change of scenery. Upon looking outside windows, unprompted- pt reported recognizing "[employer] employee parking lot." Pt demonstrating increased orientation throughout sessions today. Pt participated in horse shoe toss, to promote increased dynamic balance, as well as standing endurance needed for self-care tasks. Pt sit > stand with mod A using RW, initially with both hands on RW and knee extension onto w/c cushion. Education provided on steps  for prepping to stand, with mass practice of 5 sit <> stands, fading from mod-max A, to min A using RW. Pt able to maintain about 2 mins in standing with CGA for balance, before needing seated rest break due to decreased endurance. Pt left seated in w/c in room, at window for increased arousal, with belt alarm  on, awaiting next session, with all needs in reach at end of session.   Therapy Documentation Precautions:  Precautions Precautions: Fall Restrictions Weight Bearing Restrictions: No  Pain: No c/o pain   Therapy/Group: Individual Therapy  Blayden Conwell E Nela Bascom 05/16/2021, 7:50 AM

## 2021-05-16 NOTE — Progress Notes (Signed)
Speech Language Pathology Daily Session Note  Patient Details  Name: [name] MRN: [id number] Date of Birth: [birth year]  Today's Date: 05/16/2021 SLP Individual Time: 1445-1530 SLP Individual Time Calculation (min): 45 min  Short Term Goals: Week 1: SLP Short Term Goal 1 (Week 1): Pt will demonstrate sustained attention to functional tasks for 1-3 minutes with mod A multimodal cues SLP Short Term Goal 2 (Week 1): Pt will increase orientation to person, place, time and situation with max A cues for use of external aides SLP Short Term Goal 3 (Week 1): Pt will complete basic problem solving tasks with 50% accuracy provided max A cues SLP Short Term Goal 4 (Week 1): Pt will recall functional information from OT/PT sessions with max A cues/aides SLP Short Term Goal 5 (Week 1): Pt will utilize compensatory strategies for speech intellibility with max A cues  Skilled Therapeutic Interventions:   Patient seen for skilled ST session with nephew and friend/neighbor both present in room with focus on cognitive skills. Patient sitting up in [facility] and very alert and attentive. He was oriented to year and month, and approximate date (stated "28th or 29th?"). He was able to recall and describe general information about earlier therapy tasks completed. He had some difficulty when asked how long he had been in the hospital. When completing alternating attention task with SLP, he required mod-maxA cues to redirect attention as he would become very easily distracted and unable to hold onto information long enough to write down on another location on paper. He was adequately attentive throughout, appeared to try to the best of his ability. He did report that at home he has a day planner. Patient requested pen from his nephew and SLP encouraged him to keep daily log/notes using daily therapy schedules. Patient left in [facility] with friend present in room and alarm waist belt in place. He continues to benefit from skilled  SLP intervention to maximize cognitive and speech function prior to discharge.  Pain Pain Assessment Pain Scale: 0-10 Pain Score: 0-No pain  Therapy/Group: Individual Therapy  Sonia Baller, MA, CCC-SLP Speech Therapy

## 2021-05-16 NOTE — Progress Notes (Signed)
PROGRESS NOTE   Subjective/Complaints: Appreciate palliative care involvement Started iron for fatigue Patient's chart reviewed- No issues reported overnight SBP elevated and DBP soft  ROS: +fatigue   Objective:   No results found. Recent Labs    05/13/21 1732 05/14/21 0543  WBC 3.3* 2.8*  HGB 6.1* 5.9*  HCT 19.2* 18.2*  PLT 272 294   Recent Labs    05/13/21 1732 05/14/21 0543  NA  --  144  K  --  4.8  CL  --  113*  CO2  --  21*  GLUCOSE  --  171*  BUN  --  26*  CREATININE 1.91* 1.79*  CALCIUM  --  8.2*    Intake/Output Summary (Last 24 hours) at 05/16/2021 0946 Last data filed at 05/16/2021 0859 Gross per 24 hour  Intake 692 ml  Output 1075 ml  Net -383 ml     Pressure Injury 05/05/21 Buttocks Right Stage 1 -  Intact skin with non-blanchable redness of a localized area usually over a bony prominence. small open area on right buttocks (Active)  05/05/21 1700  Location: Buttocks  Location Orientation: Right  Staging: Stage 1 -  Intact skin with non-blanchable redness of a localized area usually over a bony prominence.  Wound Description (Comments): small open area on right buttocks  Present on Admission:     Physical Exam: Vital Signs Blood pressure (!) 139/54, pulse 60, temperature 98 F (36.7 C), resp. rate 18, SpO2 93 %. Gen: fatigued HEENT: oral mucosa pink and moist, NCAT Cardio: Reg rate Chest: normal effort, normal rate of breathing Abd: soft, non-distended Ext: no edema Psych: fatigued Musculoskeletal:     Cervical back: Normal range of motion. No rigidity.     Comments: LUE/LLE 5-/5 RUE 4/5 except R hand- grip 3-/5 and FA 2-/5- has hx of chronic R hand damage- held open - not using- except as secondary support when eating RLE_ 4+/5 in HF/KE/KF/DF and PF  Skin:    General: Skin is warm and dry.     Comments: SPC and Perc drain as above IV's in L AC fossa and R wrist- look OK   Neurological:     Comments: Patient is alert.  Makes eye contact with examiner.  Provides name and age.  Follows commands.  Limited but fair medical historian. HOH- but making some jokes- Ox3 upon questioning Sensation to light touch intact x4 except R hand  Psychiatric:        Mood and Affect: Mood normal.        Behavior: Behavior normal.    Assessment/Plan: 1. Functional deficits which require 3+ hours per day of interdisciplinary therapy in a comprehensive inpatient rehab setting. Physiatrist is providing close team supervision and 24 hour management of active medical problems listed below. Physiatrist and rehab team continue to assess barriers to discharge/monitor patient progress toward functional and medical goals  Care Tool:  Bathing    Body parts bathed by patient: Right arm, Left arm, Chest, Abdomen, Right upper leg, Left upper leg, Face   Body parts bathed by helper: Front perineal area, Buttocks, Right lower leg, Left lower leg     Bathing assist Assist Level:  Moderate Assistance - Patient 50 - 74%     Upper Body Dressing/Undressing Upper body dressing   What is the patient wearing?: Pull over shirt    Upper body assist Assist Level: Minimal Assistance - Patient > 75%    Lower Body Dressing/Undressing Lower body dressing      What is the patient wearing?: Incontinence brief, Pants     Lower body assist Assist for lower body dressing: Maximal Assistance - Patient 25 - 49%     Toileting Toileting    Toileting assist Assist for toileting: Dependent - Patient 0%     Transfers Chair/bed transfer  Transfers assist     Chair/bed transfer assist level: Moderate Assistance - Patient 50 - 74%     Locomotion Ambulation   Ambulation assist   Ambulation activity did not occur: Safety/medical concerns (Severe lethargy, global deconditioning)          Walk 10 feet activity   Assist  Walk 10 feet activity did not occur: Safety/medical concerns  (Severe lethargy, global deconditioning)        Walk 50 feet activity   Assist Walk 50 feet with 2 turns activity did not occur: Safety/medical concerns (Severe lethargy, global deconditioning)         Walk 150 feet activity   Assist Walk 150 feet activity did not occur: Safety/medical concerns (Severe lethargy, global deconditioning)         Walk 10 feet on uneven surface  activity   Assist Walk 10 feet on uneven surfaces activity did not occur: Safety/medical concerns (Severe lethargy, global deconditioning)         Wheelchair     Assist Is the patient using a wheelchair?: Yes Type of Wheelchair: Manual    Wheelchair assist level: Dependent - Patient 0%      Wheelchair 50 feet with 2 turns activity    Assist        Assist Level: Dependent - Patient 0%   Wheelchair 150 feet activity     Assist      Assist Level: Dependent - Patient 0%   Blood pressure (!) 139/54, pulse 60, temperature 98 F (36.7 C), resp. rate 18, SpO2 93 %.  Medical Problem List and Plan: 1. 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  Impaired mobility: continue Subcutaneous heparin q8H.             -antiplatelet therapy: Aspirin 81 mg daily and Plavix 75 mg day x3 weeks then Plavix alone 3. Pain: continue Tylenol as needed 4. Mood: Provide emotional support             -antipsychotic agents: N/A 5. Neuropsych: This patient is capable of making decisions on his own behalf. 6. Skin/Wound Care: Routine skin checks 7. Fluids/Electrolytes/Nutrition: Routine in and outs with follow-up chemistries 8.  Hypertension.  Decrease Coreg to 9.275mg  BID given bradycardia  below. Increase hydralazine to 50 mg every 8 hours.  9.  History of CAD with CABG.  Continue Imdur 60 mg twice daily 10.  COVID-19 pneumonia.  Treated with remdesivir and steroid.  Isolation discontinued 12/23  11.  Symptomatic anemia.  Declines blood product due to Jehovah witness..  Continue Aranesp, Hgb 5.9- continue to monitor as needed. Iron tablet started 12.  Diabetes mellitus.  Hemoglobin A1c 8.5.  SSI.  Patient on Glucophage 500 mg twice daily as well as Amaryl 4 mg daily prior to admission.  Resume as needed 13.  Percutaneous cholecystostomy drain malfunction.  Patient hospitalized [facility] in October for cholecystitis.  Continue Cardura.  Not a surgical candidate status post percutaneous cholecystostomy drain. 14.  Hyperlipidemia.  Zetia 15.  Pseudomonas UTI.  Completing course of IV Fortaz. 16. Constipation- LBM 1 week ago- will give sorbitol and monitor.  17. Bradycardia: decrease coreg to 9.375mg  BID    LOS: 3 days A FACE TO FACE EVALUATION WAS PERFORMED  [name] 05/16/2021, 9:46 AM

## 2021-05-16 NOTE — IPOC Note (Signed)
Overall Plan of Care [facility]) Patient Details Name: [name] MRN: [id number] DOB: 01/28/1931  Admitting Diagnosis: Lacunar infarction [facility])  Hospital Problems: Principal Problem:   Lacunar infarction [facility]) Active Problems:   CKD (chronic kidney disease) stage 3, GFR 30-59 ml/min (HCC)   Chronic anemia   DNR (do not resuscitate)     Functional Problem List: Nursing Bowel, Medication Management, Safety, Endurance, Skin Integrity  PT Balance, Endurance, Motor, Perception, Safety  OT Balance, Cognition, Endurance, Motor, Pain, Perception, Safety, Skin Integrity  SLP Cognition, Safety, Endurance  TR         Basic ADLs: OT Eating, Grooming, Bathing, Dressing, Toileting     Advanced  ADLs: OT Simple Meal Preparation     Transfers: PT Bed Mobility, Bed to Chair, Car, Manufacturing systems engineer, Metallurgist: PT Ambulation, Emergency planning/management officer, Stairs     Additional Impairments: OT Fuctional Use of Upper Extremity  SLP Communication, Social Cognition comprehension, expression Problem Solving, Memory, Attention, Awareness  TR      Anticipated Outcomes Item Anticipated Outcome  Self Feeding Set up A  Swallowing      Basic self-care  Supervision-min A  Toileting  Supervision-min A   Bathroom Transfers Supervision  Bowel/Bladder  manage bowel w mod I assist  Transfers  Min A  Locomotion  Min A  Communication  Supervision  Cognition  Min A  Pain  N/A  Safety/Judgment  maintain safety with cues   Therapy Plan: PT Intensity: Minimum of 1-2 x/day ,45 to 90 minutes PT Frequency: 5 out of 7 days PT Duration Estimated Length of Stay: 12-14 days OT Intensity: Minimum of 1-2 x/day, 45 to 90 minutes OT Frequency: 5 out of 7 days OT Duration/Estimated Length of Stay: 12-14 days SLP Intensity: Minumum of 1-2 x/day, 30 to 90 minutes SLP Frequency: 3 to 5 out of 7 days SLP Duration/Estimated Length of Stay: 12-14 days   Due to the current state of  emergency, patients may not be receiving their 3-hours of Medicare-mandated therapy.   Team Interventions: Nursing Interventions Disease Management/Prevention, Bowel Management, Patient/Family Education, Skin Care/Wound Management, Discharge Planning, Medication Management  PT interventions Ambulation/gait training, Community reintegration, DME/adaptive equipment instruction, Neuromuscular re-education, Psychosocial support, UE/LE Strength taining/ROM, Wheelchair propulsion/positioning, Stair training, Training and development officer, Discharge planning, Functional electrical stimulation, Pain management, Skin care/wound management, Therapeutic Activities, UE/LE Coordination activities, Visual/perceptual remediation/compensation, Therapeutic Exercise, Functional mobility training, Disease management/prevention, Cognitive remediation/compensation, Patient/family education, Splinting/orthotics  OT Interventions Balance/vestibular training, Discharge planning, Pain management, Self Care/advanced ADL retraining, Therapeutic Activities, UE/LE Coordination activities, Cognitive remediation/compensation, Functional mobility training, Disease mangement/prevention, Patient/family education, Therapeutic Exercise, Community reintegration, Engineer, drilling, Psychosocial support, UE/LE Strength taining/ROM, Wheelchair propulsion/positioning  SLP Interventions Cognitive remediation/compensation, English as a second language teacher, Functional tasks, Patient/family education, Therapeutic Activities, Therapeutic Exercise, Speech/Language facilitation, Internal/external aids, Environmental controls  TR Interventions    SW/CM Interventions     Barriers to Discharge MD  Medical stability  Nursing Decreased caregiver support, Wound Care 2 level ramped entry solo; daughter to assist intermittently  PT Decreased caregiver support, Lack of/limited family support, Behavior    OT Decreased caregiver support, Incontinence,  Behavior, Other (comments) (cognitive deficits that minimize carryover of education)    SLP Decreased caregiver support    SW       Team Discharge Planning: Destination: PT- (TBD) ,OT- Home , SLP-Home (tbd) Projected Follow-up: PT-Other (comment) (Unable to obtain thorough evaluation, follow up TBD), OT-  Home health OT, SLP-Other (comment) (tbd, daughter needs to be  involved in decision) Projected Equipment Needs: PT-To be determined, OT- 3 in 1 bedside comode, To be determined, SLP-None recommended by SLP Equipment Details: PT- , OT-  Patient/family involved in discharge planning: PT- Patient unable/family or caregiver not available,  OT-Patient, SLP-Patient, Family member/caregiver  MD ELOS: 7-10 days ModA Medical Rehab Prognosis:  Guarded Assessment: [name] is an 87 year old man admitted to CIR with functional deficits with right side weakness and confusion secondary to 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 Medications are being managed, and labs and vitals are being monitored regularly.     See Team Conference Notes for weekly updates to the plan of care

## 2021-05-17 LAB — GLUCOSE, CAPILLARY
Glucose-Capillary: 144 mg/dL — ABNORMAL HIGH (ref 70–99)
Glucose-Capillary: 166 mg/dL — ABNORMAL HIGH (ref 70–99)
Glucose-Capillary: 246 mg/dL — ABNORMAL HIGH (ref 70–99)
Glucose-Capillary: 203 mg/dL — ABNORMAL HIGH (ref 70–99)

## 2021-05-17 NOTE — Progress Notes (Signed)
Speech Language Pathology Daily Session Note  Patient Details  Name: [name] MRN: [id number] Date of Birth: 01-29-1931  Today's Date: 05/17/2021 SLP Individual Time: 7207-2182 SLP Individual Time Calculation (min): 18 min Today's Date: 05/17/2021 SLP Missed Time: 27 Minutes Missed Time Reason: Patient fatigue  Short Term Goals: Week 1: SLP Short Term Goal 1 (Week 1): Pt will demonstrate sustained attention to functional tasks for 1-3 minutes with mod A multimodal cues SLP Short Term Goal 2 (Week 1): Pt will increase orientation to person, place, time and situation with max A cues for use of external aides SLP Short Term Goal 3 (Week 1): Pt will complete basic problem solving tasks with 50% accuracy provided max A cues SLP Short Term Goal 4 (Week 1): Pt will recall functional information from OT/PT sessions with max A cues/aides SLP Short Term Goal 5 (Week 1): Pt will utilize compensatory strategies for speech intellibility with max A cues  Skilled Therapeutic Interventions: Pt seen for skilled ST with focus on cognitive goals, pt initially sleeping upon entry but easily roused. Pt agreeable to ST session and denies any previous therapy this AM (PT was in earlier). Pt asking "Can I get these leg wraps off?" SLP providing education on TED hose remaining on at this time. SLP attempting to facilitate orientation task however pt falling quickly asleep. Pt would wake to voice but was only able to maintain alertness for ~45 seconds before falling asleep during functional tasks. Due to inability to maintain arousal, pt left in bed with alarm set and all needs within reach. Pt missed 27 minutes of skilled ST due to fatigue. Cont ST POC.   Pain Pain Assessment Pain Scale: 0-10 Pain Score: 0-No pain  Therapy/Group: Individual Therapy  Dewaine Conger 05/17/2021, 10:02 AM

## 2021-05-17 NOTE — Progress Notes (Signed)
Physical Therapy Session Note  Patient Details  Name: [name] MRN: [id number] Date of Birth: Dec 14, 1930  Today's Date: 05/17/2021 PT Individual Time: 4037-5436 PT Individual Time Calculation (min): 17 min   Short Term Goals: Week 1:  PT Short Term Goal 1 (Week 1): Pt will perform sit <>stand w/mod A and LRAD PT Short Term Goal 2 (Week 1): Pt will ambulate 25' w/LRAD and mod A PT Short Term Goal 3 (Week 1): Pt will perform bed <>chair transfers w/LRAD and mod A PT Short Term Goal 4 (Week 1): Pt will demonstrate sustained attention to task for 2 mintues w/S* and min verbal cues  Skilled Therapeutic Interventions/Progress Updates:  Patient supine in bed asleep on entrance to room. Patient easily roused and agreeable to PT session, but then quickly falls asleep. When asked how pt is feeling, he replies, "lousy". No complaint of pain, but extreme fatigue. Educated on importance of continuing participation with therapy sessions in order to progress back to PLOF, however, unable to maintain pt's arousal to keep focus and engaged with pt continuing to fall asleep.   Patient left supine  in bed at end of session with brakes locked, bed alarm set, and all needs within reach. OT and NT notified as to pt's status.  Therapy Documentation Precautions:  Precautions Precautions: Fall Restrictions Weight Bearing Restrictions: No General: PT Amount of Missed Time (min): 28 Minutes PT Missed Treatment Reason: Patient fatigue;Patient unwilling to participate (unable to remain awake) Vital Signs: Therapy Vitals Pulse Rate: 70 BP: 125/64 Pain: Pain Assessment Pain Scale: 0-10 Pain Score: 0-No pain  Therapy/Group: Individual Therapy  Alger Simons PT, DPT 05/17/2021, 8:59 AM

## 2021-05-17 NOTE — Progress Notes (Signed)
Physical Therapy Session Note  Patient Details  Name: [name] MRN: [id number] Date of Birth: 1930-10-13  Today's Date: 05/17/2021 PT Individual Time: 3154-0086 PT Individual Time Calculation (min): 25 min   Short Term Goals: Week 1:  PT Short Term Goal 1 (Week 1): Pt will perform sit <>stand w/mod A and LRAD PT Short Term Goal 2 (Week 1): Pt will ambulate 25' w/LRAD and mod A PT Short Term Goal 3 (Week 1): Pt will perform bed <>chair transfers w/LRAD and mod A PT Short Term Goal 4 (Week 1): Pt will demonstrate sustained attention to task for 2 mintues w/S* and min verbal cues  Skilled Therapeutic Interventions/Progress Updates:     Pt received seated in [facility] and agrees to therapy. No complaint of pain. Pt is aware of Month and location but requires cues to correctly identify situation and year. OT had said she was working on orientation questions just prior to PT session. WC transport to gym for time management. Pt performs sit to stand with modA and cues for anterior weight shifting and powering up. Stand step transfers to Nustep, x4', with minA for stability and cues for sequencing. Pt performs Nustep for strength and endurance training, as well as reciprocal coordination. Pt completes 3:00 at workload of 6 with average steps per minute ~45. Pt takes seated rest break and PT cues for pursed lip breathing to optimize oxygen sats and cardiovascular recovery. Pt completes 3x3:00 at workload of 4 with average steps per minute ~55. Pt performs stand step transfer to [facility] with modA cues at trunk and hips for extension and sequencing. Left seated in WC with alarm intact and all needs within reach.  Therapy Documentation Precautions:  Precautions Precautions: Fall Restrictions Weight Bearing Restrictions: No  Therapy/Group: Individual Therapy  Breck Coons, PT, DPT 05/17/2021, 3:40 PM

## 2021-05-17 NOTE — Progress Notes (Signed)
PROGRESS NOTE   Subjective/Complaints: Sleepy Tolerated therapy well today SBP elevated, DBP soft Denies pain  ROS: +fatigue, denies pain   Objective:   No results found. No results for input(s): WBC, HGB, HCT, PLT in the last 72 hours.  No results for input(s): NA, K, CL, CO2, GLUCOSE, BUN, CREATININE, CALCIUM in the last 72 hours.   Intake/Output Summary (Last 24 hours) at 05/17/2021 1544 Last data filed at 05/17/2021 1313 Gross per 24 hour  Intake 753 ml  Output 1200 ml  Net -447 ml     Pressure Injury 05/05/21 Buttocks Right Stage 1 -  Intact skin with non-blanchable redness of a localized area usually over a bony prominence. small open area on right buttocks (Active)  05/05/21 1700  Location: Buttocks  Location Orientation: Right  Staging: Stage 1 -  Intact skin with non-blanchable redness of a localized area usually over a bony prominence.  Wound Description (Comments): small open area on right buttocks  Present on Admission:     Physical Exam: Vital Signs Blood pressure (!) 138/53, pulse 62, temperature 97.9 F (36.6 C), resp. rate 16, SpO2 94 %. Gen: fatigued HEENT: oral mucosa pink and moist, NCAT Cardio: Reg rate Chest: normal effort, normal rate of breathing, 94% on room air Abd: soft, non-distended Ext: no edema Psych: fatigued Musculoskeletal:     Cervical back: Normal range of motion. No rigidity.     Comments: LUE/LLE 5-/5 RUE 4/5 except R hand- grip 3-/5 and FA 2-/5- has hx of chronic R hand damage- held open - not using- except as secondary support when eating RLE_ 4+/5 in HF/KE/KF/DF and PF  Skin:    General: Skin is warm and dry.     Comments: SPC and Perc drain as above IV's in L AC fossa and R wrist- look OK  Neurological:     Comments: Patient is alert.  Makes eye contact with examiner.  Provides name and age.  Follows commands.  Limited but fair medical historian. HOH- but making  some jokes- Ox3 upon questioning Sensation to light touch intact x4 except R hand  Psychiatric:        Mood and Affect: Mood normal.        Behavior: Behavior normal.    Assessment/Plan: 1. Functional deficits which require 3+ hours per day of interdisciplinary therapy in a comprehensive inpatient rehab setting. Physiatrist is providing close team supervision and 24 hour management of active medical problems listed below. Physiatrist and rehab team continue to assess barriers to discharge/monitor patient progress toward functional and medical goals  Care Tool:  Bathing    Body parts bathed by patient: Right arm, Left arm, Abdomen, Chest, Right upper leg, Left upper leg, Right lower leg, Left lower leg, Face   Body parts bathed by helper: Front perineal area, Buttocks, Right lower leg, Left lower leg     Bathing assist Assist Level: Contact Guard/Touching assist     Upper Body Dressing/Undressing Upper body dressing   What is the patient wearing?: Pull over shirt    Upper body assist Assist Level: Contact Guard/Touching assist    Lower Body Dressing/Undressing Lower body dressing      What  is the patient wearing?: Underwear/pull up, Pants     Lower body assist Assist for lower body dressing: Minimal Assistance - Patient > 75%     Toileting Toileting    Toileting assist Assist for toileting: Total Assistance - Patient < 25%     Transfers Chair/bed transfer  Transfers assist     Chair/bed transfer assist level: Moderate Assistance - Patient 50 - 74%     Locomotion Ambulation   Ambulation assist   Ambulation activity did not occur: Safety/medical concerns (Severe lethargy, global deconditioning)          Walk 10 feet activity   Assist  Walk 10 feet activity did not occur: Safety/medical concerns (Severe lethargy, global deconditioning)        Walk 50 feet activity   Assist Walk 50 feet with 2 turns activity did not occur: Safety/medical  concerns (Severe lethargy, global deconditioning)         Walk 150 feet activity   Assist Walk 150 feet activity did not occur: Safety/medical concerns (Severe lethargy, global deconditioning)         Walk 10 feet on uneven surface  activity   Assist Walk 10 feet on uneven surfaces activity did not occur: Safety/medical concerns (Severe lethargy, global deconditioning)         Wheelchair     Assist Is the patient using a wheelchair?: Yes Type of Wheelchair: Manual    Wheelchair assist level: Dependent - Patient 0%      Wheelchair 50 feet with 2 turns activity    Assist        Assist Level: Dependent - Patient 0%   Wheelchair 150 feet activity     Assist      Assist Level: Dependent - Patient 0%   Blood pressure (!) 138/53, pulse 62, temperature 97.9 F (36.6 C), resp. rate 16, SpO2 94 %.  Medical Problem List and Plan: 1. 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  Impaired mobility: continue Subcutaneous heparin q8H.             -antiplatelet therapy: Aspirin 81 mg daily and Plavix 75 mg day x3 weeks then Plavix alone 3. Pain: N/A 4. Mood: Provide emotional support             -antipsychotic agents: N/A 5. Neuropsych: This patient is capable of making decisions on his own behalf. 6. Skin/Wound Care: Routine skin checks 7. Fluids/Electrolytes/Nutrition: Routine in and outs with follow-up chemistries 8.  Hypertension.  SBP elevated and DBP soft: continue Coreg to 9.275mg  BID given bradycardia below. Increase hydralazine to 50 mg every 8 hours.  9.  History of CAD with CABG.  Continue Imdur 60 mg twice daily 10.  COVID-19 pneumonia.   Treated with remdesivir and steroid.  Isolation discontinued 12/23 11.  Symptomatic anemia.  Declines blood product due to Jehovah witness..  Continue Aranesp, Hgb 5.9- continue to monitor as needed. Iron tablet started 12.  Diabetes mellitus.  Hemoglobin A1c 8.5.  SSI.  Patient on Glucophage 500 mg twice daily as well as Amaryl 4 mg daily prior to admission.  Resume as needed 13.  Percutaneous cholecystostomy drain malfunction.  Patient hospitalized [facility] in October for cholecystitis.  Continue Cardura.  Not a surgical candidate status post percutaneous cholecystostomy drain. 14.  Hyperlipidemia.  Zetia 15.  Pseudomonas UTI.  Completing course of IV Fortaz. 16. Constipation- LBM 1 week ago- will give sorbitol and monitor.  17. Bradycardia: decrease coreg to 9.375mg  BID    LOS: 4 days A FACE TO FACE EVALUATION WAS PERFORMED  [name] 05/17/2021, 3:44 PM

## 2021-05-18 LAB — GLUCOSE, CAPILLARY
Glucose-Capillary: 123 mg/dL — ABNORMAL HIGH (ref 70–99)
Glucose-Capillary: 160 mg/dL — ABNORMAL HIGH (ref 70–99)
Glucose-Capillary: 180 mg/dL — ABNORMAL HIGH (ref 70–99)
Glucose-Capillary: 207 mg/dL — ABNORMAL HIGH (ref 70–99)

## 2021-05-19 LAB — GLUCOSE, CAPILLARY
Glucose-Capillary: 152 mg/dL — ABNORMAL HIGH (ref 70–99)
Glucose-Capillary: 223 mg/dL — ABNORMAL HIGH (ref 70–99)
Glucose-Capillary: 202 mg/dL — ABNORMAL HIGH (ref 70–99)
Glucose-Capillary: 179 mg/dL — ABNORMAL HIGH (ref 70–99)

## 2021-05-19 MED ORDER — GLIMEPIRIDE 2 MG PO TABS
2.0000 mg | ORAL_TABLET | Freq: Every day | ORAL | Status: DC
  Administered 2021-05-20 – 2021-05-23 (×4): 2 mg via ORAL
  Filled 2021-05-19 (×4): qty 1

## 2021-05-19 NOTE — Progress Notes (Signed)
PROGRESS NOTE   Subjective/Complaints:  Pt reports "so far so good". Just woke up.  LBM 2 days ago- denies constipation.  Denies pain- ate 100% tray.   ROS:  Pt denies SOB, abd pain, CP, N/V/C/D, and vision changes    Objective:   No results found. No results for input(s): WBC, HGB, HCT, PLT in the last 72 hours.  No results for input(s): NA, K, CL, CO2, GLUCOSE, BUN, CREATININE, CALCIUM in the last 72 hours.   Intake/Output Summary (Last 24 hours) at 05/19/2021 1114 Last data filed at 05/19/2021 0600 Gross per 24 hour  Intake 840 ml  Output 1400 ml  Net -560 ml     Pressure Injury 05/05/21 Buttocks Right Stage 1 -  Intact skin with non-blanchable redness of a localized area usually over a bony prominence. small open area on right buttocks (Active)  05/05/21 1700  Location: Buttocks  Location Orientation: Right  Staging: Stage 1 -  Intact skin with non-blanchable redness of a localized area usually over a bony prominence.  Wound Description (Comments): small open area on right buttocks  Present on Admission:     Physical Exam: Vital Signs Blood pressure (!) 159/58, pulse (!) 58, temperature (!) 97.5 F (36.4 C), temperature source Oral, resp. rate 18, SpO2 93 %.   General: awake, alert, appropriate, sitting up in bed; NAD HENT: conjugate gaze; oropharynx moist CV: regular rate; no JVD Pulmonary: CTA B/L; no W/R/R- good air movement GI: soft, NT, ND, (+)BS Psychiatric: appropriate; flat, but interactive Neurological: alert Musculoskeletal:     Cervical back: Normal range of motion. No rigidity.     Comments: LUE/LLE 5-/5 RUE 4/5 except R hand- grip 3-/5 and FA 2-/5- has hx of chronic R hand damage- held open - not using- except as secondary support when eating RLE_ 4+/5 in HF/KE/KF/DF and PF  Skin:    General: Skin is warm and dry.     Comments: SPC and Perc drain as above IV's in L AC fossa and R wrist-  look OK  Neurological:     Comments: Patient is alert.  Makes eye contact with examiner.  Provides name and age.  Follows commands.  Limited but fair medical historian. HOH- but making some jokes- Ox3 upon questioning Sensation to light touch intact x4 except R hand  Psychiatric:        Mood and Affect: Mood normal.        Behavior: Behavior normal.    Assessment/Plan: 1. Functional deficits which require 3+ hours per day of interdisciplinary therapy in a comprehensive inpatient rehab setting. Physiatrist is providing close team supervision and 24 hour management of active medical problems listed below. Physiatrist and rehab team continue to assess barriers to discharge/monitor patient progress toward functional and medical goals  Care Tool:  Bathing    Body parts bathed by patient: Right arm, Left arm, Abdomen, Chest, Right upper leg, Left upper leg, Right lower leg, Left lower leg, Face   Body parts bathed by helper: Front perineal area, Buttocks, Right lower leg, Left lower leg     Bathing assist Assist Level: Contact Guard/Touching assist     Upper Body Dressing/Undressing Upper body  dressing   What is the patient wearing?: Pull over shirt    Upper body assist Assist Level: Contact Guard/Touching assist    Lower Body Dressing/Undressing Lower body dressing      What is the patient wearing?: Underwear/pull up, Pants     Lower body assist Assist for lower body dressing: Minimal Assistance - Patient > 75%     Toileting Toileting    Toileting assist Assist for toileting: Total Assistance - Patient < 25%     Transfers Chair/bed transfer  Transfers assist     Chair/bed transfer assist level: Moderate Assistance - Patient 50 - 74%     Locomotion Ambulation   Ambulation assist   Ambulation activity did not occur: Safety/medical concerns (Severe lethargy, global deconditioning)          Walk 10 feet activity   Assist  Walk 10 feet activity did not  occur: Safety/medical concerns (Severe lethargy, global deconditioning)        Walk 50 feet activity   Assist Walk 50 feet with 2 turns activity did not occur: Safety/medical concerns (Severe lethargy, global deconditioning)         Walk 150 feet activity   Assist Walk 150 feet activity did not occur: Safety/medical concerns (Severe lethargy, global deconditioning)         Walk 10 feet on uneven surface  activity   Assist Walk 10 feet on uneven surfaces activity did not occur: Safety/medical concerns (Severe lethargy, global deconditioning)         Wheelchair     Assist Is the patient using a wheelchair?: Yes Type of Wheelchair: Manual    Wheelchair assist level: Dependent - Patient 0%      Wheelchair 50 feet with 2 turns activity    Assist        Assist Level: Dependent - Patient 0%   Wheelchair 150 feet activity     Assist      Assist Level: Dependent - Patient 0%   Blood pressure (!) 159/58, pulse (!) 58, temperature (!) 97.5 F (36.4 C), temperature source Oral, resp. rate 18, SpO2 93 %.  Medical Problem List and Plan: 1. 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  Impaired mobility: continue Subcutaneous heparin q8H.             -antiplatelet therapy: Aspirin 81 mg daily and Plavix 75 mg day x3 weeks then Plavix alone 3. Pain: N/A 4. Mood: Provide emotional support             -antipsychotic agents: N/A 5. Neuropsych: This patient is capable of making decisions on his own behalf. 6. Skin/Wound Care: Routine skin checks 7. Fluids/Electrolytes/Nutrition: Routine in and outs with follow-up chemistries 8.   Hypertension.  SBP elevated and DBP soft: continue Coreg to 9.275mg  BID given bradycardia below. Increase hydralazine to 50 mg every 8 hours.   1/2- BP a little elevated- likely due to reduction in Coreg- will monitor and see if stabilizes- if not, Dr Ranell Patrick might tweak tomorrow.  9.  History of CAD with CABG.  Continue  Imdur 60 mg twice daily 10.  COVID-19 pneumonia.  Treated with remdesivir and steroid.  Isolation discontinued 12/23 11.  Symptomatic anemia.  Declines blood product due to Jehovah witness..  Continue Aranesp, Hgb 5.9- continue to monitor as needed. Iron tablet started 12.  Diabetes mellitus.  Hemoglobin A1c 8.5.  SSI.  Patient on Glucophage 500 mg twice daily as well as Amaryl 4 mg daily prior to admission.  Resume as needed  1/2- CBGs 123-207- will restart Amaryl 2 mg daily.  13.  Percutaneous cholecystostomy drain malfunction.  Patient hospitalized [facility] in October for cholecystitis.  Continue Cardura.  Not a surgical candidate status post percutaneous cholecystostomy drain. 14.  Hyperlipidemia.  Zetia 15.  Pseudomonas UTI.  Completing course of IV Fortaz. 16. Constipation- LBM 1 week ago- will give sorbitol and monitor.   1/2- LBM 2 days ago- denies constipaiton- if no BM by tomorrow, suggest Sorbitol 17. Bradycardia: decrease coreg to 9.375mg  BID  1/2- HR high 50s/low 60s- con't regimen    LOS: 6 days A FACE TO FACE EVALUATION WAS PERFORMED  Edgel Degnan 05/19/2021, 11:14 AM

## 2021-05-19 NOTE — Progress Notes (Signed)
Physical Therapy Session Note  Patient Details  Name: [name] MRN: [id number] Date of Birth: February 18, 1931  Today's Date: 05/19/2021 PT Individual Time: 1101-1157 PT Individual Time Calculation (min): 56 min   Short Term Goals: Week 1:  PT Short Term Goal 1 (Week 1): Pt will perform sit <>stand w/mod A and LRAD PT Short Term Goal 2 (Week 1): Pt will ambulate 25' w/LRAD and mod A PT Short Term Goal 3 (Week 1): Pt will perform bed <>chair transfers w/LRAD and mod A PT Short Term Goal 4 (Week 1): Pt will demonstrate sustained attention to task for 2 mintues w/S* and min verbal cues  Skilled Therapeutic Interventions/Progress Updates:  Pt received seated in WC in room asleep, hunched over to L side but easy to arouse. Pt very disoriented and lethargic throughout session, unable to recall the year, month or day of week accurately despite therapist informing him prior to asking A+O Q's. Pt denied pain and was hesitant to participate, reported therapist was "arresting him" and stated "I was just trying to find [name], I did not need to be locked up for that". Reoriented pt on his current location and identity of therapist and with max encouragement, pt agreeable to PT. Emphasis of session on improved sustained attention, activity tolerance and gait training.   Gait training:  Obtained NT for +2 for added safety. Pt performed sit <>stand from WC to RW w/mod A, extra time required for initiation and anterior weight shift. Mod verbal cues for hand placement. Pt ambulated 20' from room to hallway w/min-mod A and RW, Noted significant R path deviations, R lateral trunk lean, step-to gait pattern, decreased step length/clearance bilaterally and kyphotic posture. Pt unable to turn w/RW and walked into wall, mistook guard rails for a chair and attempted to sit down on railing. Max verbal and tactile cues to turn to sit in [facility] behind him, stand <>sit w/max A for guidance of hips and safety. Pt extremely  disoriented, reported he was at home and was unable to recognize the wall as a wall and insisted it was a seat.   Pt transported back to room w/total A for safety and the following exercises were performed for BLE/BUE strength and improved endurance:  -Seated alt. marches (synchro w/therapist for improved attention), 2x15 per side  -Banded pull aparts w/orange theraband, 2x4, limited by L shoulder pain  -Bicep curls w/orange theraband, 2x10 per side   Progressed to sit <>stand practice from [facility] to RW. Pt initially required mod-light max A. Provided visual demonstration for proper motor planning and hand placement after to first attempt and pt able to perform 5 more sit <>stands w/min A. Pt able to hold for 15-30s at a time, noted significant posterior lean against WC to assist in balance. Mod verbal cues for anterior shifting w/hips and forward gaze to facilitate upright posture and reduce lean. Noted pt prefers visual cues more than verbal cues, so provided visual demonstration of anterior weight shift in stand and pt able to demonstrate on final sit <>stand w/min A and BUE support on RW.   Pt was left seated in WC in room, safety belt on, all needs in reach.    Therapy Documentation Precautions:  Precautions Precautions: Fall Restrictions Weight Bearing Restrictions: No   Therapy/Group: Individual Therapy Cruzita Lederer Honi Name, PT, DPT  05/19/2021, 7:53 AM

## 2021-05-19 NOTE — Progress Notes (Signed)
Physical Therapy Session Note  Patient Details  Name: [name] MRN: [id number] Date of Birth: 1931/01/17  Today's Date: 05/19/2021 PT Individual Time: 0900-0915 PT Individual Time Calculation (min): 15 min  Missed 15 mins due to fatigue/refusal  Short Term Goals: Week 1:  PT Short Term Goal 1 (Week 1): Pt will perform sit <>stand w/mod A and LRAD PT Short Term Goal 2 (Week 1): Pt will ambulate 25' w/LRAD and mod A PT Short Term Goal 3 (Week 1): Pt will perform bed <>chair transfers w/LRAD and mod A PT Short Term Goal 4 (Week 1): Pt will demonstrate sustained attention to task for 2 mintues w/S* and min verbal cues  Skilled Therapeutic Interventions/Progress Updates:    Patient received reclined in bed, stating that he was about to fall asleep. PT attempting to encourage patient to participate in short therapy session. Patient agreeable to getting dressed at bed level. When PT pulled back covers, he already had pants on. PT then requesting that patient don shirt. Patient stating "I'm okay in this" (referring to hospital gown). PT attempting to offer other therapeutic activities to patient- patient declined. Patient then stating "I'll be okay so long as I have warm clothes on." PT reminding patient that this therapist offered to help patient get warm clothes on. Patient with no recollection despite that conversation occurring <35min prior. PT attempting to continue to encourage patient to participate in therapy. He kindly declined. Patient resting in bed, bed alarm on, call light within reach.   Therapy Documentation Precautions:  Precautions Precautions: Fall Restrictions Weight Bearing Restrictions: No    Therapy/Group: Individual Therapy  Karoline Caldwell, PT, DPT, CBIS  05/19/2021, 7:45 AM

## 2021-05-19 NOTE — Progress Notes (Signed)
Occupational Therapy Session Note  Patient Details  Name: [name] MRN: [id number] Date of Birth: Mar 13, 1931  Today's Date: 05/19/2021 OT Individual Time: 6659-9357 OT Individual Time Calculation (min): 56 min    Short Term Goals: Week 1:  OT Short Term Goal 1 (Week 1): Pt will sit > stand in prep for functional task with LRAD and min A OT Short Term Goal 2 (Week 1): Pt will donn pants with mod A OT Short Term Goal 3 (Week 1): Pt will complete toilet transfer with min A  Skilled Therapeutic Interventions/Progress Updates:  Skilled OT intervention completed with focus on ADL retraining, cognitive re-orientation, activity tolerance and functional transfers. Pt received supine in bed, asleep but easily woken. Pt agreeable to session. Note- window shade found to be down, with room very dark, and prior PT reporting that pt was too lethargic for session. Raised window shade, with note placed above pt's bed for staff to leave window shade up during day to promote increased arousal/orientation. Doffed gown with supervision, then donned new shirt with min A this session. Pt sit > stand using RW with mod-max A this session, with total A for doffing pants over hips. Pt initially wearing boxers, however slight BM smear in underwear noted, so therapist threaded incontinence brief with total A in case of future episode. Pt able to thread pants with min A, using figure 4 leg position. Donned TEDs with total A for time, and pt donning grip socks with min A. Pt expressing fatigue, saying "i'm lazy", with education provided about energy required for self-care and importance of pacing self for energy conservation. Pt sit > stand with mod A using RW for therapist to donn pants over hips. Pt stand pivot to w/c with min A. While seated in w/c at sink, pt shaved face using electric razor with min A for missed areas. Note- pt used RUE functionally during dressing tasks, but found to be slightly neglectful when in w/c  with RUE hanging down on w/c wheel and required cues during grooming tasks to maintain safety with R hand. Pt with mod difficulty opening toothpaste this session, however able to complete with compensatory strategies and self-problem solving. Pt participated in mass practice sit > stands at sink using RW with assist fading from mod-max A to min A. Pt however still demonstrating compensatory knee extension on w/c and decreased motor planning/strength for functional transfers. Pt left seated in w/c, with belt alarm on, call bell and all other needs in reach at end of session.   Therapy Documentation Precautions:  Precautions Precautions: Fall Restrictions Weight Bearing Restrictions: No  Pain: No c/o pain   Therapy/Group: Individual Therapy  Haywood Meinders E Andric Kerce 05/19/2021, 7:41 AM

## 2021-05-19 NOTE — Progress Notes (Signed)
Speech Language Pathology Daily Session Note  Patient Details  Name: [name] MRN: [id number] Date of Birth: [birth year]  Today's Date: 05/19/2021 SLP Individual Time: 1517-6160 SLP Individual Time Calculation (min): 27 min  Short Term Goals: Week 1: SLP Short Term Goal 1 (Week 1): Pt will demonstrate sustained attention to functional tasks for 1-3 minutes with mod A multimodal cues SLP Short Term Goal 2 (Week 1): Pt will increase orientation to person, place, time and situation with max A cues for use of external aides SLP Short Term Goal 3 (Week 1): Pt will complete basic problem solving tasks with 50% accuracy provided max A cues SLP Short Term Goal 4 (Week 1): Pt will recall functional information from OT/PT sessions with max A cues/aides SLP Short Term Goal 5 (Week 1): Pt will utilize compensatory strategies for speech intellibility with max A cues  Skilled Therapeutic Interventions: Pt seen for skilled ST with focus on cognitive goals, pt in bed awake when SLP entered. Pt able to sustain attention to functional tasks at beginning of sessions for ~5 minute increments with min A. SLP facilitating orientation task by providing MAX A verbal and visual cues that were mostly ineffective. When asked DOW, pt stating "8:30" despite repetition of prompt x3. MOY pt responded "8th" and year pt stating "8:37". Pt able to discuss life events with min A and extra time. At this point, pt falling asleep quickly and snoring, unable to maintain alertness for further meaningful treatment. Pt left in bed with alarm set and all needs within reach. Cont ST POC.  Pain Pain Assessment Pain Scale: 0-10 Pain Score: 0-No pain  Therapy/Group: Individual Therapy  Dewaine Conger 05/19/2021, 3:38 PM

## 2021-05-20 LAB — GLUCOSE, CAPILLARY
Glucose-Capillary: 129 mg/dL — ABNORMAL HIGH (ref 70–99)
Glucose-Capillary: 273 mg/dL — ABNORMAL HIGH (ref 70–99)
Glucose-Capillary: 94 mg/dL (ref 70–99)
Glucose-Capillary: 153 mg/dL — ABNORMAL HIGH (ref 70–99)

## 2021-05-20 MED ORDER — METFORMIN HCL 500 MG PO TABS
250.0000 mg | ORAL_TABLET | Freq: Every day | ORAL | Status: DC
  Administered 2021-05-21: 250 mg via ORAL
  Filled 2021-05-20: qty 1

## 2021-05-20 MED ORDER — SODIUM CHLORIDE 0.9% FLUSH
10.0000 mL | Freq: Two times a day (BID) | INTRAVENOUS | Status: DC
  Administered 2021-05-20 – 2021-05-28 (×16): 10 mL

## 2021-05-20 NOTE — Progress Notes (Signed)
Physical Therapy Session Note  Patient Details  Name: [name] MRN: [id number] Date of Birth: 10-29-1930  Today's Date: 05/20/2021 PT Individual Time: 1130-1157; 1517-6160 PT Individual Time Calculation (min): 27 min and 71 min  Short Term Goals: Week 1:  PT Short Term Goal 1 (Week 1): Pt will perform sit <>stand w/mod A and LRAD PT Short Term Goal 2 (Week 1): Pt will ambulate 25' w/LRAD and mod A PT Short Term Goal 3 (Week 1): Pt will perform bed <>chair transfers w/LRAD and mod A PT Short Term Goal 4 (Week 1): Pt will demonstrate sustained attention to task for 2 mintues w/S* and min verbal cues  Skilled Therapeutic Interventions/Progress Updates:  Session 1  Pt seen for unscheduled make-up therapy time. Pt received asleep in recliner, slumped over to L side, easy to arouse. Pt denied pain but was extremely lethargic and unable to stay awake for session. Emphasis of session on transfers and BLE strength. Pt's daughter present for session and inquired about pt's current status and safety concerns going home. Informed daughter that pt was not safe to be home alone and recommended 24/7 care. Care team notified of daughter's concerns. Pt performed sit <>stand from recliner to RW w/max A, min verbal cues for hand placement and max physical assistance due to fatigue and global deconditioning. Stand pivot from recliner to EOB w/min A and RW, stand <> sit w/mod A for eccentric control. Sit <>supine w/S* and pt was left supine in bed, asleep, all needs in reach.   Session 2 Pt received supine in bed, daughter present. Pt denied pain and was agreeable to PT. Emphasis of session on improved activity tolerance, gait training and global strengthening. Pt performed supine <>sit w/min A for trunk support and L posterolateral lean correction. Sit <>stand from EOB w/max A and pt ambulated 5' to [facility] w/RW and min A for hand placement and sequencing. Once seated, pt reported urge to have BM. Sit <>stand from [facility]  w/mod A and pt ambulated 10' to [facility] w/RW and min A. Pt doffed pants w/CGA and stand <>sit w/mod A for eccentric control. Noted incontinent smear in brief. Pt continued having BM continently in toilet and attempted to perform peri care w/set-up assist but was unable to laterally weight shift in order to reach peri area. Sit <>stand from [facility] x2 w/mod A to complete peri care, change brief, and donn pants w/total A. Pt ambulated 10' to [facility] w/RW and min A and was transported to sink to perform hand hygiene w/S*.   Pt transported to 5N hallway w/total A for time management for gait training, daughter present for family training. Pt performed sit <>stands from [facility] w/min A and min verbal cues for hand placement. Pt ambulated 8', 25, and 30' w/RW and min A, close WC follow due to fatigue. Pt demonstrated decreased step length/clearance bilaterally, downward gaze, and narrow BOS. Noted pt's step length and cadence improved with each trial and required shorter periods of seated rest between each progressive trial.   Pt transported back to room w/total A 2/2 fatigue and pt performed sit <>stand pivot from WC to EOB w/min A due to fatigue. Pt was left seated EOB in room, daughter present, preparing for OT session, all needs in reach.   Therapy Documentation Precautions:  Precautions Precautions: Fall Restrictions Weight Bearing Restrictions: No   Therapy/Group: Individual Therapy Cruzita Lederer Wendell Nicoson, PT, DPT  05/20/2021, 7:54 AM

## 2021-05-20 NOTE — Progress Notes (Signed)
Speech Language Pathology Daily Session Note  Patient Details  Name: [name] MRN: [id number] Date of Birth: January 01, 1931  Today's Date: 05/20/2021 SLP Individual Time: 2458-0998 SLP Individual Time Calculation (min): 45 min  Short Term Goals: Week 1: SLP Short Term Goal 1 (Week 1): Pt will demonstrate sustained attention to functional tasks for 1-3 minutes with mod A multimodal cues SLP Short Term Goal 2 (Week 1): Pt will increase orientation to person, place, time and situation with max A cues for use of external aides SLP Short Term Goal 3 (Week 1): Pt will complete basic problem solving tasks with 50% accuracy provided max A cues SLP Short Term Goal 4 (Week 1): Pt will recall functional information from OT/PT sessions with max A cues/aides SLP Short Term Goal 5 (Week 1): Pt will utilize compensatory strategies for speech intellibility with max A cues  Skilled Therapeutic Interventions: Pt seen for skilled ST with focus on cognitive goals, in bed and alert upon SLP entry. Pt demonstrating significantly improved alertness and attention today, able to participate in simple conversation ~40 minutes. Pt demonstrating modified independence with scanning room to answer orientation questions (looking to white board and stating correct DOW, MOY, date and year). SLP facilitating simple problem solving for room environment by providing overall min A verbal cues. Pt discussing discharge options including offer to live with daughter but he states his goal is to return home. Pt benefits from education on current medical conditions and limitations in cognitive and physical function which will impact safety at home. Pt is receptive to information. Pt becoming slightly fatigued toward end of session but maintains alertness and participation with verbal cues. Pt left in bed with alarm set and all needs within reach. Cont ST POC.   Pain Pain Assessment Pain Scale: 0-10 Pain Score: 0-No  pain  Therapy/Group: Individual Therapy  Dewaine Conger 05/20/2021, 8:56 AM

## 2021-05-20 NOTE — Progress Notes (Signed)
Occupational Therapy Session Note  Patient Details  Name: [name] MRN: [id number] Date of Birth: Sep 12, 1930  Today's Date: 05/20/2021 OT Individual Time: 1005-1058 & 1435-1515 OT Individual Time Calculation (min): 53 min & 40 min OT Missed Time: 20 Minutes Missed Time Reason: Patient fatigue;Nursing care   Short Term Goals: Week 1:  OT Short Term Goal 1 (Week 1): Pt will sit > stand in prep for functional task with LRAD and min A OT Short Term Goal 2 (Week 1): Pt will donn pants with mod A OT Short Term Goal 3 (Week 1): Pt will complete toilet transfer with min A  Skilled Therapeutic Interventions/Progress Updates:  Session 1 Skilled OT intervention completed with focus on ADL retraining. Pt received supine in bed, asleep but easily woken and agreeable to session. Pt oriented x2 this session stating he's here "because he had no where else to go" but able to recall location. Pt completed bed mobility with hand held assist for lifting trunk with min-mod A. Pt doffed shirt with min A this session. Small puncture area with minor bleeding noticed on pt's abdomen, minimal clotting with pressure applied- notified pt's LPN, and LPN in room to assess and apply pressure dressing. Pt donned new shirt with mod A this session as he donned a button down- with cues needed for hemi-technique to increase efficiency and pt with mod difficulty with large buttons however able to fasten one with increased time. Pt sit > stand with bed elevated using RW with mod-max A and mod A to doff pants over hips, with pt c/o dizziness. BP checked seated 150/66, and encouraged fluid intake. Doffed pants with total A for time/pt dizziness, then threaded new pants on RLE and pt requiring mod A for LB dressing. Pt sit > stand with bed elevated using RW required min A to power up, mod A to donn pants over hips, and BP checked standing at 137/73, pt asymptomatic, non-orthostatic. Pt stand pivot to w/c with mod A and decreased  command following this session, with pt with 1 posterior LOB upon sitting with mod A needed to correct. Reassessed BP seated in recliner, at 146/58. Pt educated on importance of drinking plenty of fluids throughout day for hydration. Pt left seated in recliner, with belt alarm on, window shade open and lights on for increased arousal, and all needs in reach at end of session.  Session 2 Skilled OT intervention completed with focus on home management and DME education with pt and family. Pt received supine in bed asleep, easily woken, with pt's daughter in room. Extensive discussion completed with pt's daughter about pt's CLOF, cognitive impairment with daughter stating that this is not at pt's baseline since November. Advised pt's daughter that pt will need at the very least, supervision assist if d/c home, with need of initiation cues to attend to task and for safety due to decreased balance, endurance and awareness. Daughter reporting that there are several times throughout the day/night that pt would have to be unsupervised, which therapist advised against. Discussed potential options of d/c to ALF or SNF, if needed, but rehab goals are for supervision, meaning no physical assist, but might have to downgrade if no carryover and physical assist increases. Pt and daughter engaged in education about DME recommended once home, including BSC and TTB with education provided about uses and purposes of each. Pt demonstrating decreased arousal at end of education, with LPN in room to provide meds and pt politely requesting to rest. Pt left  supine in bed, with LPN in room administering meds, pt's bed alarm on and all immediate needs met at end of session. Pt missed 20 mins of OT due to fatigue/nursing care.   Therapy Documentation Precautions:  Precautions Precautions: Fall Restrictions Weight Bearing Restrictions: No  Pain: No c/o pain   Therapy/Group: Individual Therapy  Breylon Sherrow E Lillyauna Jenkinson 05/20/2021, 7:57  AM

## 2021-05-20 NOTE — Progress Notes (Signed)
PROGRESS NOTE   Subjective/Complaints: Sleepy Eating well Denies pain Tolerating therapy well.   ROS:  Pt denies SOB, abd pain, CP, N/V/C/D, and vision changes    Objective:   No results found. No results for input(s): WBC, HGB, HCT, PLT in the last 72 hours.  No results for input(s): NA, K, CL, CO2, GLUCOSE, BUN, CREATININE, CALCIUM in the last 72 hours.   Intake/Output Summary (Last 24 hours) at 05/20/2021 1154 Last data filed at 05/20/2021 0739 Gross per 24 hour  Intake 1080 ml  Output 1900 ml  Net -820 ml     Pressure Injury 05/05/21 Buttocks Right Stage 1 -  Intact skin with non-blanchable redness of a localized area usually over a bony prominence. small open area on right buttocks (Active)  05/05/21 1700  Location: Buttocks  Location Orientation: Right  Staging: Stage 1 -  Intact skin with non-blanchable redness of a localized area usually over a bony prominence.  Wound Description (Comments): small open area on right buttocks  Present on Admission:     Physical Exam: Vital Signs Blood pressure 134/65, pulse 78, temperature 98 F (36.7 C), resp. rate 16, SpO2 94 %.   General: awake, alert, appropriate, sitting up in bed; NAD HENT: conjugate gaze; oropharynx moist CV: regular rate; no JVD Pulmonary: CTA B/L; no W/R/R- good air movement GI: soft, NT, ND, (+)BS Psychiatric: appropriate; flat, but interactive, significant improvement in alertness Neurological: alert Musculoskeletal:     Cervical back: Normal range of motion. No rigidity.     Comments: LUE/LLE 5-/5 RUE 4/5 except R hand- grip 3-/5 and FA 2-/5- has hx of chronic R hand damage- held open - not using- except as secondary support when eating RLE_ 4+/5 in HF/KE/KF/DF and PF  Skin:    General: Skin is warm and dry.     Comments: SPC and Perc drain as above IV's in L AC fossa and R wrist- look OK  Neurological:     Comments: Patient is alert.   Makes eye contact with examiner.  Provides name and age.  Follows commands.  Limited but fair medical historian. HOH- but making some jokes- Ox3 upon questioning Sensation to light touch intact x4 except R hand  Psychiatric:        Mood and Affect: Mood normal.        Behavior: Behavior normal.    Assessment/Plan: 1. Functional deficits which require 3+ hours per day of interdisciplinary therapy in a comprehensive inpatient rehab setting. Physiatrist is providing close team supervision and 24 hour management of active medical problems listed below. Physiatrist and rehab team continue to assess barriers to discharge/monitor patient progress toward functional and medical goals  Care Tool:  Bathing    Body parts bathed by patient: Face   Body parts bathed by helper: Front perineal area, Buttocks, Right lower leg, Left lower leg     Bathing assist Assist Level: Set up assist     Upper Body Dressing/Undressing Upper body dressing   What is the patient wearing?: Pull over shirt    Upper body assist Assist Level: Minimal Assistance - Patient > 75%    Lower Body Dressing/Undressing Lower body dressing  What is the patient wearing?: Pants, Incontinence brief     Lower body assist Assist for lower body dressing: Minimal Assistance - Patient > 75%     Toileting Toileting    Toileting assist Assist for toileting: Total Assistance - Patient < 25%     Transfers Chair/bed transfer  Transfers assist     Chair/bed transfer assist level: Moderate Assistance - Patient 50 - 74%     Locomotion Ambulation   Ambulation assist   Ambulation activity did not occur: Safety/medical concerns (Severe lethargy, global deconditioning)  Assist level: 2 helpers Assistive device: Walker-rolling Max distance: 20'   Walk 10 feet activity   Assist  Walk 10 feet activity did not occur: Safety/medical concerns (Severe lethargy, global deconditioning)  Assist level: 2  helpers Assistive device: Walker-rolling   Walk 50 feet activity   Assist Walk 50 feet with 2 turns activity did not occur: Safety/medical concerns (Severe lethargy, global deconditioning)         Walk 150 feet activity   Assist Walk 150 feet activity did not occur: Safety/medical concerns (Severe lethargy, global deconditioning)         Walk 10 feet on uneven surface  activity   Assist Walk 10 feet on uneven surfaces activity did not occur: Safety/medical concerns (Severe lethargy, global deconditioning)         Wheelchair     Assist Is the patient using a wheelchair?: Yes Type of Wheelchair: Manual    Wheelchair assist level: Dependent - Patient 0%      Wheelchair 50 feet with 2 turns activity    Assist        Assist Level: Dependent - Patient 0%   Wheelchair 150 feet activity     Assist      Assist Level: Dependent - Patient 0%   Blood pressure 134/65, pulse 78, temperature 98 F (36.7 C), resp. rate 16, SpO2 94 %.  Medical Problem List and Plan: 1. 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  Impaired mobility: continue Subcutaneous heparin q8H.             -antiplatelet therapy: Aspirin 81 mg daily and Plavix 75 mg day x3 weeks then Plavix alone 3. Pain: N/A 4. Mood: Provide emotional support             -antipsychotic agents: N/A 5. Neuropsych: This patient is capable of making decisions on his own behalf. 6. Skin/Wound Care: Routine skin checks 7. Fluids/Electrolytes/Nutrition: Routine in and outs with follow-up chemistries 8.  Hypertension.  SBP elevated and DBP normal: continue Coreg to 9.275mg  BID given  bradycardia below. Increase hydralazine to 50 mg every 8 hours.   1/2- BP a little elevated- likely due to reduction in Coreg- will monitor and see if stabilizes- if not, Dr Ranell Patrick might tweak tomorrow.  9.  History of CAD with CABG.  Continue Imdur 60 mg twice daily 10.  COVID-19 pneumonia.  Treated with remdesivir and steroid.  Isolation discontinued 12/23 11.  Symptomatic anemia.  Declines blood product due to Jehovah witness..  Continue Aranesp, Hgb  5.9- continue to monitor as needed. Iron tablet started 12.  Diabetes mellitus.  Hemoglobin A1c 8.5.  SSI.  Patient on Glucophage 500 mg twice daily as well as Amaryl 4 mg daily prior to admission.  Resume as needed  CBGs 123-207- will restart Amaryl 2 mg daily. Restart metformin 250mg  13.  Percutaneous cholecystostomy drain malfunction.  Patient hospitalized [facility] in October for cholecystitis.  Continue Cardura.  Not a surgical candidate status post percutaneous cholecystostomy drain. 14.  Hyperlipidemia.  Zetia 15.  Pseudomonas UTI.  Completing course of IV Fortaz. 16. Constipation- LBM 1 week ago- will give sorbitol and monitor.   1/2- LBM 2 days ago- denies constipaiton- if no BM by tomorrow, suggest Sorbitol 17. Bradycardia: decrease coreg to 9.375mg  BID  1/2- HR high 50s/low 60s- con't regimen 18. Stage 3 CKD: Cr 1.79, improving, monitor Mondays.     LOS: 7 days A FACE TO FACE EVALUATION WAS PERFORMED  Clide Deutscher Izadora Roehr 05/20/2021, 11:54 AM

## 2021-05-21 LAB — GLUCOSE, CAPILLARY
Glucose-Capillary: 100 mg/dL — ABNORMAL HIGH (ref 70–99)
Glucose-Capillary: 177 mg/dL — ABNORMAL HIGH (ref 70–99)
Glucose-Capillary: 208 mg/dL — ABNORMAL HIGH (ref 70–99)
Glucose-Capillary: 142 mg/dL — ABNORMAL HIGH (ref 70–99)

## 2021-05-21 MED ORDER — METFORMIN HCL 500 MG PO TABS
500.0000 mg | ORAL_TABLET | Freq: Every day | ORAL | Status: DC
  Administered 2021-05-22 – 2021-05-23 (×2): 500 mg via ORAL
  Filled 2021-05-21 (×2): qty 1

## 2021-05-21 MED ORDER — CARVEDILOL 6.25 MG PO TABS
6.2500 mg | ORAL_TABLET | Freq: Two times a day (BID) | ORAL | Status: DC
  Administered 2021-05-21 – 2021-05-28 (×14): 6.25 mg via ORAL
  Filled 2021-05-21 (×14): qty 1

## 2021-05-21 MED ORDER — HYDRALAZINE HCL 50 MG PO TABS
75.0000 mg | ORAL_TABLET | Freq: Three times a day (TID) | ORAL | Status: DC
  Administered 2021-05-21 – 2021-05-23 (×6): 75 mg via ORAL
  Filled 2021-05-21 (×6): qty 1

## 2021-05-21 NOTE — Progress Notes (Signed)
PROGRESS NOTE   Subjective/Complaints: Looking much more alert this morning Team conference today Ambulating 10 feet Max A sit to stand recliner  ROS:  Pt denies SOB, abd pain, CP, N/V/C/D, and vision changes    Objective:   No results found. No results for input(s): WBC, HGB, HCT, PLT in the last 72 hours.  No results for input(s): NA, K, CL, CO2, GLUCOSE, BUN, CREATININE, CALCIUM in the last 72 hours.   Intake/Output Summary (Last 24 hours) at 05/21/2021 1140 Last data filed at 05/21/2021 0750 Gross per 24 hour  Intake 860 ml  Output 2425 ml  Net -1565 ml     Pressure Injury 05/05/21 Buttocks Right Stage 1 -  Intact skin with non-blanchable redness of a localized area usually over a bony prominence. small open area on right buttocks (Active)  05/05/21 1700  Location: Buttocks  Location Orientation: Right  Staging: Stage 1 -  Intact skin with non-blanchable redness of a localized area usually over a bony prominence.  Wound Description (Comments): small open area on right buttocks  Present on Admission:     Physical Exam: Vital Signs Blood pressure (!) 146/60, pulse 71, temperature 97.7 F (36.5 C), resp. rate 20, SpO2 93 %.  General: No apparent distress HEENT: Head is normocephalic, atraumatic, PERRLA, EOMI, sclera anicteric, oral mucosa pink and moist, dentition intact, ext ear canals clear,  Neck: Supple without JVD or lymphadenopathy Heart: Bradycardic. No murmurs rubs or gallops Chest: CTA bilaterally without wheezes, rales, or rhonchi; no distress Abdomen: Soft, non-tender, non-distended, bowel sounds positive. Extremities: No clubbing, cyanosis, or edema. Pulses are 2+ Psychiatric: appropriate; flat, but interactive, significant improvement in alertness Neurological: alert Musculoskeletal:     Cervical back: Normal range of motion. No rigidity.     Comments: LUE/LLE 5-/5 RUE 4/5 except R hand- grip  3-/5 and FA 2-/5- has hx of chronic R hand damage- held open - not using- except as secondary support when eating RLE_ 4+/5 in HF/KE/KF/DF and PF  Skin:    General: Skin is warm and dry.     Comments: SPC and Perc drain as above IV's in L AC fossa and R wrist- look OK  Neurological:     Comments: Patient is alert.  Makes eye contact with examiner.  Provides name and age.  Follows commands.  Limited but fair medical historian. HOH- but making some jokes- Ox3 upon questioning Sensation to light touch intact x4 except R hand  Psychiatric:        Mood and Affect: Mood normal.        Behavior: Behavior normal.    Assessment/Plan: 1. Functional deficits which require 3+ hours per day of interdisciplinary therapy in a comprehensive inpatient rehab setting. Physiatrist is providing close team supervision and 24 hour management of active medical problems listed below. Physiatrist and rehab team continue to assess barriers to discharge/monitor patient progress toward functional and medical goals  Care Tool:  Bathing    Body parts bathed by patient: Face   Body parts bathed by helper: Front perineal area, Buttocks, Right lower leg, Left lower leg     Bathing assist Assist Level: Set up assist  Upper Body Dressing/Undressing Upper body dressing   What is the patient wearing?: Pull over shirt    Upper body assist Assist Level: Minimal Assistance - Patient > 75%    Lower Body Dressing/Undressing Lower body dressing      What is the patient wearing?: Pants, Incontinence brief     Lower body assist Assist for lower body dressing: Minimal Assistance - Patient > 75%     Toileting Toileting    Toileting assist Assist for toileting: Total Assistance - Patient < 25%     Transfers Chair/bed transfer  Transfers assist     Chair/bed transfer assist level: Minimal Assistance - Patient > 75%     Locomotion Ambulation   Ambulation assist   Ambulation activity did not  occur: Safety/medical concerns (Severe lethargy, global deconditioning)  Assist level: Minimal Assistance - Patient > 75% Assistive device: Walker-rolling Max distance: 30'   Walk 10 feet activity   Assist  Walk 10 feet activity did not occur: Safety/medical concerns (Severe lethargy, global deconditioning)  Assist level: Minimal Assistance - Patient > 75% Assistive device: Walker-rolling   Walk 50 feet activity   Assist Walk 50 feet with 2 turns activity did not occur: Safety/medical concerns (Severe lethargy, global deconditioning)         Walk 150 feet activity   Assist Walk 150 feet activity did not occur: Safety/medical concerns (Severe lethargy, global deconditioning)         Walk 10 feet on uneven surface  activity   Assist Walk 10 feet on uneven surfaces activity did not occur: Safety/medical concerns (Severe lethargy, global deconditioning)         Wheelchair     Assist Is the patient using a wheelchair?: Yes Type of Wheelchair: Manual    Wheelchair assist level: Dependent - Patient 0%      Wheelchair 50 feet with 2 turns activity    Assist        Assist Level: Dependent - Patient 0%   Wheelchair 150 feet activity     Assist      Assist Level: Dependent - Patient 0%   Blood pressure (!) 146/60, pulse 71, temperature 97.7 F (36.5 C), resp. rate 20, SpO2 93 %.  Medical Problem List and Plan: 1. 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  Impaired mobility: continue Subcutaneous heparin q8H.             -antiplatelet therapy: Aspirin 81 mg daily and Plavix 75  mg day x3 weeks then Plavix alone 3. Pain: N/A 4. Mood: Provide emotional support             -antipsychotic agents: N/A 5. Neuropsych: This patient is capable of making decisions on his own behalf. 6. Skin/Wound Care: Routine skin checks 7. Fluids/Electrolytes/Nutrition: Routine in and outs with follow-up chemistries 8.  Hypertension.  SBP elevated and DBP normal: continue Coreg to 9.275mg  BID given bradycardia below. Increase Hydralazine to 75mg  TID.  9.  History of CAD with CABG.  Continue Imdur 60 mg twice daily 10.  COVID-19 pneumonia.  Treated with remdesivir and steroid.  Isolation discontinued 12/23 11.  Symptomatic anemia.  Declines blood product due to Jehovah witness..  Continue Aranesp, Hgb 5.9- continue to monitor as needed. Iron tablet started 12.  Diabetes mellitus.  Hemoglobin A1c 8.5.  SSI.  Patient on Glucophage 500 mg twice daily as well as Amaryl 4 mg daily prior to admission.  Resume as needed  CBGs 123-207- will restart Amaryl 2 mg daily. Restart metformin 250mg  13.  Percutaneous cholecystostomy drain malfunction.  Patient hospitalized [facility] in October for cholecystitis.  Continue Cardura.  Not a surgical candidate status post percutaneous cholecystostomy drain. 14.  Hyperlipidemia.  Zetia 15.  Pseudomonas UTI.  Completing course of IV Fortaz. 16. Constipation- LBM 1 week ago- will give sorbitol and monitor.   1/2- LBM 2 days ago- denies constipaiton- if no BM by tomorrow, suggest Sorbitol 17. Bradycardia: decrease Coreg to 6.25mg  BID 18. Stage 3 CKD: Cr 1.79, improving, monitor Mondays.  19. Impaired cognition: continue SLP 20. Impaired problem solving: continue SLP.     LOS: 8 days A FACE TO FACE EVALUATION WAS PERFORMED  Chalice Philbert P Romualdo Prosise 05/21/2021, 11:40 AM

## 2021-05-21 NOTE — Progress Notes (Signed)
Physical Therapy Session Note  Patient Details  Name: [name] MRN: [id number] Date of Birth: May 23, 1930  Today's Date: 05/21/2021 PT Individual Time: 7544-9201 PT Individual Time Calculation (min): 55 min   Short Term Goals: Week 1:  PT Short Term Goal 1 (Week 1): Pt will perform sit <>stand w/mod A and LRAD PT Short Term Goal 2 (Week 1): Pt will ambulate 25' w/LRAD and mod A PT Short Term Goal 3 (Week 1): Pt will perform bed <>chair transfers w/LRAD and mod A PT Short Term Goal 4 (Week 1): Pt will demonstrate sustained attention to task for 2 mintues w/S* and min verbal cues  Skilled Therapeutic Interventions/Progress Updates:    Pt seated in recliner at end of ST session. Agreeable to therapy and No complaint of pain. First Sit to stand to Rw max A, fading to mod A from w/c and as little as min A on first attempt after rest break from slightly elevated mat. Pt ambulated 2 x 50 ft and 1 x 70 ft with min A and +1 w/c follow. Demoes flexed trunk posture and narrow BOS. Seated rest between bouts. Sit to stand 4 x 5 with extended seated rest break for strength. Discussed debility and global weakness during rest breaks. Pt encouraged that his condition is common and he is doing the right things for it by participating in therapy. Pt with improved performance on last set with VC to breathe and pace himself during set. After rest, pt directed in step taps on 3" step ,2 x 20, for improved coordination and endurance. Pt then transported back to room and performed Stand pivot transfer to bed with mod A. Pt returned to bed with supervision, and was left with all needs in reach and alarm active.    Therapy Documentation Precautions:  Precautions Precautions: Fall Restrictions Weight Bearing Restrictions: No General:      Therapy/Group: Individual Therapy  Mickel Fuchs 05/21/2021, 2:56 PM

## 2021-05-21 NOTE — Progress Notes (Signed)
Speech Language Pathology Daily Session Note  Patient Details  Name: [name] MRN: [id number] Date of Birth: December 26, 1930  Today's Date: 05/21/2021 SLP Individual Time: 1340-1430 SLP Individual Time Calculation (min): 50 min  Short Term Goals: Week 1: SLP Short Term Goal 1 (Week 1): Pt will demonstrate sustained attention to functional tasks for 1-3 minutes with mod A multimodal cues SLP Short Term Goal 2 (Week 1): Pt will increase orientation to person, place, time and situation with max A cues for use of external aides SLP Short Term Goal 3 (Week 1): Pt will complete basic problem solving tasks with 50% accuracy provided max A cues SLP Short Term Goal 4 (Week 1): Pt will recall functional information from OT/PT sessions with max A cues/aides SLP Short Term Goal 5 (Week 1): Pt will utilize compensatory strategies for speech intellibility with max A cues  Skilled Therapeutic Interventions:   Patient seen for skilled ST session focused on cognitive function goals. When SLP arrived, patient awake and alert, sitting in recliner. He showed SLP his day planner notebook with phone numbers, list of medications, etc. He was only able to list one of the medications he had taken previously. SLP introduced Daily Math Problems subtest from [employer] but ceased this after three questions as patient struggled significantly. He was slightly off for first two responses but initially when answering question about how much money left, he stated a calendar month. He then completed check writing task with SLP providing mod-max A verbal and visual cues as well as prompts to initiate. He was able to locate and write down name of company to write check when asked to find it and was able to locate amount due with cues to find as well. He incorrectly wrote numbers for amount in wrong section, requiring maxA cues to correct. Patient left in recliner with PT entering room for therapy session. He continues to benefit from skilled  SLP intervention to maximize cognitive functioning prior to discharge.  Pain Pain Assessment Pain Scale: 0-10 Pain Score: 0-No pain  Therapy/Group: Individual Therapy  Sonia Baller, MA, CCC-SLP Speech Therapy

## 2021-05-21 NOTE — Patient Care Conference (Signed)
Inpatient RehabilitationTeam Conference and Plan of Care Update Date: 05/21/2021   Time: 11:37 AM    Patient Name: [name]      Medical Record Number: [id number]  Date of Birth: 03-May-1931 Sex: Male         Room/Bed: 4J28N/8M76H-20 Payor Info: Payor: MEDICARE / Plan: MEDICARE PART A AND B / Product Type: *No Product type* /    Admit Date/Time:  05/13/2021  5:07 PM  Primary Diagnosis:  Lacunar infarction [facility])  Hospital Problems: Principal Problem:   Lacunar infarction ([place]) Active Problems:   CKD (chronic kidney disease) stage 3, GFR 30-59 ml/min (HCC)   Chronic anemia   DNR (do not resuscitate)    Expected Discharge Date: Expected Discharge Date: 05/28/21  Team Members Present: Physician leading conference: Dr. Leeroy Cha Social Worker Present: Erlene Quan, BSW Nurse Present: Dorien Chihuahua, RN PT Present: Francena Hanly, PT OT Present: Other (comment) [facility] Alphonsa Gin, OT) SLP Present: Other (comment) Gunnar Fusi, SLP) PPS Coordinator present : Gunnar Fusi, SLP     Current Status/Progress Goal Weekly Team Focus  Bowel/Bladder   Suprapubic cath in place. Incontinent of bowel. Last BM: 1/3  Regain bowel continence  Assess toiletting needs Q 2 hours and PRN   Swallow/Nutrition/ Hydration             ADL's   Min A bathing, Min A dressing, Max A toileting, Min A toilet transfers for sit > stand and short ambulation using RW  Supervision-min A  Simulated toileting practice, LB clothing management, Cognitive compensatory strategies, orientation   Mobility   minA bed mobility, can flucuate depending on arousal level from minA to maxA for sit to stand transfers, min to modA for stand pivot transfers, and min to modA gait distances up to 22ft.  minA overall  gait, balance, endurance, d/c planning, family education   Communication   min-mod A for speech intelligibility, varies based on fatigue/confusion  Supervision  continue training for speech  intelligibility strategies   Safety/Cognition/ Behavioral Observations  mas A basic cog  Min A (may need to downgrade)  alertness, sustained attention, orientation, simple PS and functional recall   Pain   Patient denies pain  Will remain free from pain  Assess pain Q shift and PRN   Skin   Sacrum red/blanchable, foam in place. Suprapubic Cath RLQ  Skin will remain free from further breakdown  Assess skin Q shift and PRN     Discharge Planning:      Team Discussion: Patient' cognition fluctuates. Function varies with orientation and fatigue. Patient is continent of bowel, has a suprapubic catheter and drain.  Patient on target to meet rehab goals: no, currently needs min assist for bathing and dressing and mod assist for toileting. Completes showering with CGA. Needs mod - max assist for transfers and max assist for sit - stand. Able to ambulate up to 10' with max assist. Requires mod assist to complete basic tasks. Working on problem solving, sustained attention with goal for min assist with cognition. Goals for discharge set for supervision - min assist overall.   *See Care Plan and progress notes for long and short-term goals.   Revisions to Treatment Plan:  Downgraded goals due to confusion   Teaching Needs: Safety, transfers, medication management, suprapubic catheter and drain management, skin care, secondary risk management, etc.  Current Barriers to Discharge: Decreased caregiver support; daughter works and can only provide intermittent supervision  Possible Resolutions to Barriers: Family education  Medical Summary Current Status: daytime somnolence, fatigue, symptomatic anemia, suprapubic catheter, fluctuating cognition, impared cognition, bradycardia and hypertension  Barriers to Discharge: Medical stability;Behavior  Barriers to Discharge Comments: daytime somnolence, fatigue, symptomatic anemia, suprapubic catheter, fluctuating cognition, impared cognition,   bradycardia and hypertension Possible Resolutions to [employer] Focus: continue iron supplement, continue suprapubic catheter, decrease coreg dose and increase hydralazine dose   Continued Need for Acute Rehabilitation Level of Care: The patient requires daily medical management by a physician with specialized training in physical medicine and rehabilitation for the following reasons: Direction of a multidisciplinary physical rehabilitation program to maximize functional independence : Yes Medical management of patient stability for increased activity during participation in an intensive rehabilitation regime.: Yes Analysis of laboratory values and/or radiology reports with any subsequent need for medication adjustment and/or medical intervention. : Yes   I attest that I was present, lead the team conference, and concur with the assessment and plan of the team.   Dorien Chihuahua B 05/21/2021, 1:53 PM

## 2021-05-21 NOTE — Progress Notes (Signed)
Occupational Therapy Session Note  Patient Details  Name: [name] MRN: [id number] Date of Birth: 19-Jan-1931  Today's Date: 05/21/2021 OT Individual Time: 1102-1117 OT Individual Time Calculation (min): 69 min    Short Term Goals: Week 1:  OT Short Term Goal 1 (Week 1): Pt will sit > stand in prep for functional task with LRAD and min A OT Short Term Goal 2 (Week 1): Pt will donn pants with mod A OT Short Term Goal 3 (Week 1): Pt will complete toilet transfer with min A  Skilled Therapeutic Interventions/Progress Updates:  Skilled OT intervention completed with focus on ADL retraining, shower transfers, home management and energy conservation education and endurance. Pt received sideways in bed, with catheter hanging on floor, pt's bare bottom exposed and pt asleep. Pt easily woken, but disoriented reporting that he was waiting for meds, with therapist checking in with LPN and staff stating they already provided meds. Pt oriented x2 this session for day and self, but reports he's here for a "bad headache". Pt denying pain or need for new meds. Pt completed bed mobility via log roll education for increased independence with min A for trunk control to push up. Sit > stand using RW with min A then stand pivot to w/c with min A. Demonstration and education provided on shower transfer method. Pt sit > stand from w/c with min A using RW then short ambulatory transfer with min A to TTB in shower, then mod A needed for sit pivot for scooting hips and advancing BLEs. Pt doffed clothing with mod A this session for time management. All lines/bandages covered for protection from water per MD, okay to do this method. Pt completed all bathing with supervision with assist only needed for managing water temp and handing showering items as needed. Pt required mod A for dressing for time management, as pt very fatigued with all transfers and tasks completed, requiring increased time for rest breaks and pursed lip  breathing education provided vs mouth breathing/SOB. Pt sit > stand with mod-max A from TTB using RW, then CGA ambulatory transfer to recliner. Pt left seated in recliner, with belt alarm on, call bell in reach and all immediate needs met at end of session.  Therapy Documentation Precautions:  Precautions Precautions: Fall Restrictions Weight Bearing Restrictions: No  Pain: No c/o pain   Therapy/Group: Individual Therapy  Keosha Rossa E Aaric Dolph 05/21/2021, 7:27 AM

## 2021-05-21 NOTE — Progress Notes (Signed)
Patient ID: [name], male   DOB: 04-11-1931, 88 y.o.   MRN: [id number] Team Conference Report to Patient/Family  Team Conference discussion was reviewed with the patient and caregiver, including goals, any changes in plan of care and target discharge date.  Patient and caregiver express understanding and are in agreement.  The patient has a target discharge date of 05/28/21.  Sw spoke with patient daughter and provided conference updates. SNF resources provided on yesterday, additional counties to be provide don tomorrow (1/5). Patient daughter would like to continue to monitor patient progress and determine d/c to SNF or home closer to d/c. No additional questions or concerns.  Dyanne Iha 05/21/2021, 2:10 PM

## 2021-05-21 NOTE — Progress Notes (Signed)
Occupational Therapy Weekly Progress Note  Patient Details  Name: [name] MRN: [id number] Date of Birth: 1930/07/10  Beginning of progress report period: May 14, 2021 End of progress report period: May 21, 2020  Patient has met 3 of 3 short term goals.  Pt is making steady progress towards LTGs. Pt has progressed functional transfers from Max A sit > stands to min A using RW, is now able to bathe at an overall supervision level including BLEs, dress at an overall min A level and requires mod assist for toileting tasks. Pt continues to be demonstrate decreased endurance, balance, memory, and problem solving which impacts pt's ability to complete self-care tasks at a supervision level which is what will be required of pt if d/c home as planned and would benefit from continued OT to maximize independence.   Patient continues to demonstrate the following deficits: muscle weakness, decreased cardiorespiratoy endurance, impaired timing and sequencing, decreased coordination, and decreased motor planning, decreased initiation, decreased attention, decreased awareness, decreased problem solving, decreased safety awareness, decreased memory, and delayed processing, and decreased sitting balance and decreased standing balance and therefore will continue to benefit from skilled OT intervention to enhance overall performance with BADL and Reduce care partner burden.  Patient progressing toward long term goals..  Continue plan of care.  OT Short Term Goals Week 1:  OT Short Term Goal 1 (Week 1): Pt will sit > stand in prep for functional task with LRAD and min A OT Short Term Goal 1 - Progress (Week 1): Met OT Short Term Goal 2 (Week 1): Pt will donn pants with mod A OT Short Term Goal 2 - Progress (Week 1): Met OT Short Term Goal 3 (Week 1): Pt will complete toilet transfer with min A OT Short Term Goal 3 - Progress (Week 1): Met Week 2:  OT Short Term Goal 1 (Week 2): STG = LTG due to  [name] 05/21/2021, 11:55 AM

## 2021-05-22 LAB — GLUCOSE, CAPILLARY
Glucose-Capillary: 67 mg/dL — ABNORMAL LOW (ref 70–99)
Glucose-Capillary: 93 mg/dL (ref 70–99)
Glucose-Capillary: 144 mg/dL — ABNORMAL HIGH (ref 70–99)
Glucose-Capillary: 90 mg/dL (ref 70–99)
Glucose-Capillary: 157 mg/dL — ABNORMAL HIGH (ref 70–99)

## 2021-05-22 MED ORDER — ENOXAPARIN SODIUM 30 MG/0.3ML IJ SOSY
30.0000 mg | PREFILLED_SYRINGE | INTRAMUSCULAR | Status: DC
  Administered 2021-05-23 – 2021-05-27 (×5): 30 mg via SUBCUTANEOUS
  Filled 2021-05-22 (×5): qty 0.3

## 2021-05-22 NOTE — Progress Notes (Signed)
Occupational Therapy Session Note  Patient Details  Name: [name] MRN: [id number] Date of Birth: 02/02/1931  Today's Date: 05/22/2021 OT Individual Time: 1015-1100 OT Individual Time Calculation (min): 45 min    Short Term Goals: Week 2:  OT Short Term Goal 1 (Week 2): STG = LTG due to ELOS  Skilled Therapeutic Interventions/Progress Updates:  Pt greeted supine in bed  agreeable to OT intervention. Session focus on BADL reeducation, functional mobility, dynamic standing balance, caregiver education and decreasing overall caregiver burden. Pt completed supine>sit with MINA needing assist to elevate trunk into sitting. Pt completed sit<>stand from EOB with rw with MOD A to rise into standing, MIN A for stand pivot from EOB>w/c with Rw. Pt completed UB bathing from w/c with s/u assist and s/u to don [place] shirt. Pts daughter present during session reporting concern with taking pt home as she originally  was going to be able to provide 24/7 support but now needs to be able to work. Transported pt to hallway on [facility] to practice functional mobility with daughter so daughter could get an understanding of his current level of assist. Pt completed ~ 20 ft of functional mobility with pt needing MOD A to stand and MIN A to ambulate with chair follow. Pts daughter completed sit<>stand with him as well as functional ambulation with use of gait belt. Daughter reports that is his same level of assist prior to hospital admission.Pt transported back to room with total A where pt completed additional stand pivot from w/c>recliner with rw and MIN A.   pt left seated in recliner with alarm belt activated, daughter present and all needs within reach.                      Therapy Documentation Precautions:  Precautions Precautions: Fall Restrictions Weight Bearing Restrictions: No  Pain: no pain reported during session     Therapy/Group: Individual Therapy  Precious Haws 05/22/2021, 12:59 PM

## 2021-05-22 NOTE — Progress Notes (Signed)
PROGRESS NOTE   Subjective/Complaints: Sleepy this morning Tolerating therapy well Denies pain CNG 67 this AM- d/c ISS  ROS:  Pt denies SOB, abd pain, CP, N/V/C/D, and vision changes, pain    Objective:   No results found. No results for input(s): WBC, HGB, HCT, PLT in the last 72 hours.  No results for input(s): NA, K, CL, CO2, GLUCOSE, BUN, CREATININE, CALCIUM in the last 72 hours.   Intake/Output Summary (Last 24 hours) at 05/22/2021 1006 Last data filed at 05/22/2021 0251 Gross per 24 hour  Intake 420 ml  Output 550 ml  Net -130 ml     Pressure Injury 05/05/21 Buttocks Right Stage 1 -  Intact skin with non-blanchable redness of a localized area usually over a bony prominence. small open area on right buttocks (Active)  05/05/21 1700  Location: Buttocks  Location Orientation: Right  Staging: Stage 1 -  Intact skin with non-blanchable redness of a localized area usually over a bony prominence.  Wound Description (Comments): small open area on right buttocks  Present on Admission:     Physical Exam: Vital Signs Blood pressure (!) 137/56, pulse 65, temperature 98.7 F (37.1 C), temperature source Oral, resp. rate 17, SpO2 90 %. Gen: no distress, normal appearing HEENT: oral mucosa pink and moist, NCAT Cardio: Reg rate Chest: normal effort, normal rate of breathing Abd: soft, non-distended Ext: no edema Psychiatric: appropriate; flat, but interactive, significant improvement in alertness Neurological: alert Musculoskeletal:     Cervical back: Normal range of motion. No rigidity.     Comments: LUE/LLE 5-/5 RUE 4/5 except R hand- grip 3-/5 and FA 2-/5- has hx of chronic R hand damage- held open - not using- except as secondary support when eating RLE_ 4+/5 in HF/KE/KF/DF and PF  Skin:    General: Skin is warm and dry.     Comments: SPC and Perc drain as above IV's in L AC fossa and R wrist- look OK   Neurological:     Comments: Patient is alert.  Makes eye contact with examiner.  Provides name and age.  Follows commands.  Limited but fair medical historian. HOH- but making some jokes- Ox3 upon questioning Sensation to light touch intact x4 except R hand  Psychiatric:        Mood and Affect: Mood normal.        Behavior: Behavior normal.    Assessment/Plan: 1. Functional deficits which require 3+ hours per day of interdisciplinary therapy in a comprehensive inpatient rehab setting. Physiatrist is providing close team supervision and 24 hour management of active medical problems listed below. Physiatrist and rehab team continue to assess barriers to discharge/monitor patient progress toward functional and medical goals  Care Tool:  Bathing    Body parts bathed by patient: Face   Body parts bathed by helper: Front perineal area, Buttocks, Right lower leg, Left lower leg     Bathing assist Assist Level: Set up assist     Upper Body Dressing/Undressing Upper body dressing   What is the patient wearing?: Pull over shirt    Upper body assist Assist Level: Minimal Assistance - Patient > 75%    Lower Body Dressing/Undressing  Lower body dressing      What is the patient wearing?: Pants, Incontinence brief     Lower body assist Assist for lower body dressing: Minimal Assistance - Patient > 75%     Toileting Toileting    Toileting assist Assist for toileting: Total Assistance - Patient < 25%     Transfers Chair/bed transfer  Transfers assist     Chair/bed transfer assist level: Minimal Assistance - Patient > 75%     Locomotion Ambulation   Ambulation assist   Ambulation activity did not occur: Safety/medical concerns (Severe lethargy, global deconditioning)  Assist level: Minimal Assistance - Patient > 75% Assistive device: Walker-rolling Max distance: 30'   Walk 10 feet activity   Assist  Walk 10 feet activity did not occur: Safety/medical concerns  (Severe lethargy, global deconditioning)  Assist level: Minimal Assistance - Patient > 75% Assistive device: Walker-rolling   Walk 50 feet activity   Assist Walk 50 feet with 2 turns activity did not occur: Safety/medical concerns (Severe lethargy, global deconditioning)         Walk 150 feet activity   Assist Walk 150 feet activity did not occur: Safety/medical concerns (Severe lethargy, global deconditioning)         Walk 10 feet on uneven surface  activity   Assist Walk 10 feet on uneven surfaces activity did not occur: Safety/medical concerns (Severe lethargy, global deconditioning)         Wheelchair     Assist Is the patient using a wheelchair?: Yes Type of Wheelchair: Manual    Wheelchair assist level: Dependent - Patient 0%      Wheelchair 50 feet with 2 turns activity    Assist        Assist Level: Dependent - Patient 0%   Wheelchair 150 feet activity     Assist      Assist Level: Dependent - Patient 0%   Blood pressure (!) 137/56, pulse 65, temperature 98.7 F (37.1 C), temperature source Oral, resp. rate 17, SpO2 90 %.  Medical Problem List and Plan: 1. 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  Impaired mobility: continue Subcutaneous heparin q8H.             -antiplatelet therapy: Aspirin 81 mg daily and Plavix 75 mg day x3 weeks then Plavix alone 3. Pain: N/A 4. Mood: Provide emotional support             -antipsychotic agents: N/A 5. Neuropsych: This patient is capable of making decisions on his own behalf. 6. Skin/Wound Care: Routine skin checks 7. Fluids/Electrolytes/Nutrition: Routine in and outs  with follow-up chemistries 8.  Hypertension.  SBP elevated and DBP soft: continue Coreg to 9.275mg  BID given bradycardia below. Increase Hydralazine to 75mg  TID.  9.  History of CAD with CABG.  Continue Imdur 60 mg twice daily 10.  COVID-19 pneumonia.  Treated with remdesivir and steroid.  Isolation discontinued 12/23 11.  Symptomatic anemia.  Declines blood product due to Jehovah witness..  Continue Aranesp, Hgb 5.9- continue to monitor as needed. Iron tablet started 12.  Diabetes mellitus.  Hemoglobin A1c 8.5.  SSI.  Patient on Glucophage 500 mg twice daily as well as Amaryl 4 mg daily prior to admission.  Resume as needed  CBG 67 this AM: d/c ISS. Amaryl 2 mg daily. Restarted. Restart metformin 250mg  13.  Percutaneous cholecystostomy drain malfunction.  Patient hospitalized [facility] in October for cholecystitis.  Continue Cardura.  Not a surgical candidate status post percutaneous cholecystostomy drain. 14.  Hyperlipidemia.  Zetia 15.  Pseudomonas UTI.  Completing course of IV Fortaz. 16. Constipation- LBM 1 week ago- will give sorbitol and monitor.   1/2- LBM 2 days ago- denies constipaiton- if no BM by tomorrow, suggest Sorbitol 17. Bradycardia: decrease Coreg to 6.25mg  BID 18. Stage 3 CKD: Cr 1.79, improving, monitor Mondays.  19. Impaired cognition: continue SLP 20. Impaired problem solving: continue SLP.     LOS: 9 days A FACE TO FACE EVALUATION WAS PERFORMED  Melissa Pulido P Paul Trettin 05/22/2021, 10:06 AM

## 2021-05-22 NOTE — Progress Notes (Addendum)
Speech Language Pathology Weekly Progress and Session Note  Patient Details  Name: [name] MRN: [id number] Date of Birth: [birth year]  Beginning of progress report period: 05/14/2021 End of progress report period: 05/22/2021  Today's Date: 05/22/2021 SLP Individual Time: 0156-1537 SLP Individual Time Calculation (min): 20 min  Short Term Goals: Week 1: SLP Short Term Goal 1 (Week 1): Pt will demonstrate sustained attention to functional tasks for 1-3 minutes with mod A multimodal cues SLP Short Term Goal 1 - Progress (Week 1): Met SLP Short Term Goal 2 (Week 1): Pt will increase orientation to person, place, time and situation with max A cues for use of external aides SLP Short Term Goal 2 - Progress (Week 1): Met SLP Short Term Goal 3 (Week 1): Pt will complete basic problem solving tasks with 50% accuracy provided max A cues SLP Short Term Goal 3 - Progress (Week 1): Met SLP Short Term Goal 4 (Week 1): Pt will recall functional information from OT/PT sessions with max A cues/aides SLP Short Term Goal 4 - Progress (Week 1): Progressing toward goal SLP Short Term Goal 5 (Week 1): Pt will utilize compensatory strategies for speech intellibility with max A cues SLP Short Term Goal 5 - Progress (Week 1): Met    New Short Term Goals: Week 2: SLP Short Term Goal 1 (Week 2): STG=LTG's (ELOS: anticipated discharge 1/11)  Weekly Progress Updates:  Patient met 4 of 5 conservatively set goals and is making some progress towards goal he did not meet which was recalling functional information from OT and PT sessions. Patient has demonstrated improved ability to stay alert and attend to therapy tasks, and has demonstrated ability to use orientation aides (calendar, etc) and perform basic level functional tasks, however he continues to require mod-maxA cues to perform as he has difficulty sustaining attention, is easily distracted and lacks adequate insight into his impairments. His speech has been  clear at conversational level without needing to use speech strategies. He is expected to continue improve with his cognitive function goals with ongoing CIR level SLP intervention.   Intensity: Minumum of 1-2 x/day, 30 to 90 minutes Frequency: 3 to 5 out of 7 days Duration/Length of Stay: 12-14 days Treatment/Interventions: Cognitive remediation/compensation;Cueing hierarchy;Functional tasks;Patient/family education;Therapeutic Activities;Therapeutic Exercise;Speech/Language facilitation;Internal/external aids;Environmental controls   Daily Session  Skilled Therapeutic Interventions:   Patient seen for skilled ST session focused on cognitive function goals. He was sleeping and although he woke up easily to voice, he had difficulty staying awake and alert. When SLP asked him orientation questions, he was able to read on calendar that it was "2023" and "January" but not able to state the date even when SLP brought calendar to him, as he perseverated on saying "January 2023". He was also a little fixated on needing to shave his face but did not make any effort to request items to do so. SLP then noticed some blood on sheets and on closer inspection, patient was noted to have some active bleeding on stomach. Nursing was alerted and she followed up with PA. (Patient having bleeding at heparin injection site). Patient then was not able to maintain alertness and SLP ended session early, missing 40 minutes. He continues to benefit from skilled SLP intervention to maximize cognitive function prior to discharge.    General    Pain Pain Assessment Pain Scale: 0-10 Pain Score: 0-No pain  Therapy/Group: Individual Therapy  Sonia Baller, MA, CCC-SLP Speech Therapy

## 2021-05-22 NOTE — NC FL2 (Signed)
[place] LEVEL OF CARE SCREENING TOOL     IDENTIFICATION  Patient Name: ECTOR LAUREL Birthdate: 1930-06-21 Sex: male Admission Date (Current Location): 05/13/2021  [place] and [place] Number:  [place] ([place], [place], [place])   Facility and Address:  [facility], [place] [address] [place], [place] [address]      Provider Number:    Attending Physician Name and Address:  Izora Ribas, MD  Relative Name and Phone Number:  [name]: [phone]    Current Level of Care: SNF Recommended Level of Care: [place] Prior Approval Number:    Date Approved/Denied:   PASRR Number:    Discharge Plan: SNF    Current Diagnoses: Patient Active Problem List   Diagnosis Date Noted   Lacunar infarction ([place]) 05/13/2021   Cerebral thrombosis with cerebral infarction 05/06/2021   Pressure injury of skin 05/06/2021   COVID-19 04/29/2021   DNR (do not resuscitate) 04/29/2021   Accelerated hypertension 11/20/2019   Peripheral arterial disease ([place]) 11/29/2018   Anemia of chronic disease    CKD (chronic kidney disease), stage III ([place])    Poorly controlled type 2 diabetes mellitus with peripheral neuropathy ([place])    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   Vitamin D deficiency 05/27/2018   Acute CVA (cerebrovascular accident) ([place]) 05/25/2018   Right arm weakness 05/25/2018   Right sided weakness    History of CVA with residual deficit    Acute blood loss anemia    Dilated cardiomyopathy ([place]) 04/26/2018   Fatigue 04/26/2018   Leukocytosis 04/17/2018   Benign hypertensive heart disease with diastolic CHF, NYHA class 2 ([place]) 04/17/2018   History of suprapubic catheter 04/17/2018   Chronic anemia 04/17/2018   Chest pain 01/28/2018   Pseudomonas urinary tract infection    Hydrocele, right    Olecranon bursitis of left elbow    CKD (chronic kidney disease) stage 3, GFR 30-59 ml/min (HCC)    Suprapubic  catheter ([place])    History of urethral stricture    Hemispheric carotid artery syndrome 12/22/2016   AKI (acute kidney injury) ([place]) 03/28/2016   S/P angioplasty with stent 03/26/16 to VG to PDA for in-stent restenosis with DES. 03/28/2016   History of ST elevation myocardial infarction (STEMI) 03/28/2016   Carpal tunnel syndrome of right wrist 06/24/2015   Diabetes mellitus treated with oral medication ([place]) 03/21/2015   Stenosis of left carotid artery    Resistant hypertension    Hyperlipidemia LDL goal <70    Cerebral infarction due to stenosis of left middle cerebral artery ([place]) 01/22/2015   CAD S/P percutaneous coronary angioplasty 09/19/2014   Coronary artery disease involving coronary bypass graft of native heart with angina pectoris ([place]) 09/19/2014   Shortness of breath 09/19/2014   Polypharmacy 09/19/2014   Abdominal aortic aneurysm    History of stroke    Hyperlipidemia associated with type 2 diabetes mellitus ([place])    Type II diabetes mellitus with complication ([place])    Coronary artery disease involving native coronary artery of native heart with angina pectoris (HCC)    Abnormal TSH 06/19/2014   Obesity 06/19/2014   STEMI (ST elevation myocardial infarction) ([place]) 06/18/2014   NSTEMI (non-ST elevated myocardial infarction) ([place]) 06/17/2014   Bilateral lower extremity edema 03/30/2014   Dizziness 02/21/2014   Bradycardia 08/31/2013   Postoperative atrial fibrillation ([place])    Diabetes mellitus type 2 in obese (HCC)    Apnea, sleep    Balanitis xerotica obliterans  History of: Non-STEMI (non-ST elevated myocardial infarction) 11/16/2010   S/P CABG x 06-2010 11/16/2010    Orientation RESPIRATION BLADDER Height & Weight     Self, Time, Situation    Incontinent Weight:   Height:     BEHAVIORAL SYMPTOMS/MOOD NEUROLOGICAL BOWEL NUTRITION STATUS      Incontinent Diet  AMBULATORY STATUS COMMUNICATION OF NEEDS Skin   Limited Assist   Other (Comment) (Sacrum  red/blanchable, foam in place. Suprapubic Cath RLQ)                       Personal Care Assistance Level of Assistance  Bathing, Dressing, Feeding, Total care Bathing Assistance: Limited assistance Feeding assistance: Limited assistance Dressing Assistance: Limited assistance Total Care Assistance: Limited assistance   Functional Limitations Info             [place]  PT (By licensed PT), OT (By licensed OT), Speech therapy     PT Frequency: 5x a week OT Frequency: 5x a week     Speech Therapy Frequency: 5x a week      Contractures      Additional Factors Info    Code Status Info: DNR             Current Medications (05/22/2021):  This is the current hospital active medication list Current Facility-Administered Medications  Medication Dose Route Frequency Provider Last Rate Last Admin   acetaminophen (TYLENOL) tablet 650 mg  650 mg Oral Q6H PRN Angiulli, Lavon Paganini, PA-C       albuterol (VENTOLIN HFA) 108 (90 Base) MCG/ACT inhaler 2 puff  2 puff Inhalation Q2H PRN Angiulli, Lavon Paganini, PA-C       amLODipine (NORVASC) tablet 10 mg  10 mg Oral Q1500 AngiulliLavon Paganini, PA-C   10 mg at 05/21/21 1526   ascorbic acid (VITAMIN C) tablet 500 mg  500 mg Oral Daily Cathlyn Parsons, PA-C   500 mg at 05/22/21 6160   aspirin chewable tablet 81 mg  81 mg Oral Daily Cathlyn Parsons, PA-C   81 mg at 05/22/21 7371   bisacodyl (DULCOLAX) EC tablet 5 mg  5 mg Oral Daily PRN AngiulliLavon Paganini, PA-C       carvedilol (COREG) tablet 6.25 mg  6.25 mg Oral BID WC Raulkar, Clide Deutscher, MD   6.25 mg at 05/22/21 0626   Chlorhexidine Gluconate Cloth 2 % PADS 6 each  6 each Topical BID Raulkar, Clide Deutscher, MD   6 each at 05/22/21 0503   chlorpheniramine-HYDROcodone (TUSSIONEX) 10-8 MG/5ML suspension 5 mL  5 mL Oral Q12H PRN Angiulli, Lavon Paganini, PA-C       clopidogrel (PLAVIX) tablet 75 mg  75 mg Oral Daily Cathlyn Parsons, PA-C   75 mg at 05/22/21 9485   doxazosin  (CARDURA) tablet 4 mg  4 mg Oral Daily Cathlyn Parsons, PA-C   4 mg at 05/22/21 0924   [START ON 05/23/2021] enoxaparin (LOVENOX) injection 30 mg  30 mg Subcutaneous Q24H Love, Pamela S, PA-C       ezetimibe (ZETIA) tablet 10 mg  10 mg Oral Daily Cathlyn Parsons, PA-C   10 mg at 05/22/21 4627   ferrous sulfate tablet 325 mg  325 mg Oral Q breakfast Raulkar, Clide Deutscher, MD   325 mg at 05/22/21 0924   fluticasone (FLONASE) 50 MCG/ACT nasal spray 2 spray  2 spray Each Nare Daily Angiulli, Lavon Paganini, PA-C   2 spray at  69/48/54 6270   folic acid (FOLVITE) tablet 1 mg  1 mg Oral Daily Cathlyn Parsons, PA-C   1 mg at 05/22/21 3500   Gerhardt's butt cream   Topical BID Izora Ribas, MD   Given at 05/22/21 0925   glimepiride (AMARYL) tablet 2 mg  2 mg Oral Q breakfast Lovorn, Megan, MD   2 mg at 05/22/21 0924   guaiFENesin (MUCINEX) 12 hr tablet 600 mg  600 mg Oral BID Cathlyn Parsons, PA-C   600 mg at 05/22/21 9381   guaiFENesin-dextromethorphan (ROBITUSSIN DM) 100-10 MG/5ML syrup 10 mL  10 mL Oral Q4H PRN Angiulli, Lavon Paganini, PA-C       hydrALAZINE (APRESOLINE) tablet 75 mg  75 mg Oral Q8H Raulkar, Clide Deutscher, MD   75 mg at 05/22/21 0504   isosorbide mononitrate (IMDUR) 24 hr tablet 60 mg  60 mg Oral BID Cathlyn Parsons, PA-C   60 mg at 05/22/21 8299   metFORMIN (GLUCOPHAGE) tablet 500 mg  500 mg Oral Q breakfast Raulkar, Clide Deutscher, MD   500 mg at 05/22/21 0924   ondansetron (ZOFRAN) tablet 4 mg  4 mg Oral Q6H PRN Cathlyn Parsons, PA-C       Or   ondansetron [facility]) injection 4 mg  4 mg Intravenous Q6H PRN Angiulli, Lavon Paganini, PA-C       pantoprazole (PROTONIX) EC tablet 40 mg  40 mg Oral Daily Cathlyn Parsons, PA-C   40 mg at 05/22/21 0925   polyethylene glycol (MIRALAX / GLYCOLAX) packet 17 g  17 g Oral Daily Cathlyn Parsons, PA-C   17 g at 05/22/21 3716   saccharomyces boulardii (FLORASTOR) capsule 250 mg  250 mg Oral BID Cathlyn Parsons, PA-C   250 mg at 05/22/21 9678    senna-docusate (Senokot-S) tablet 1 tablet  1 tablet Oral BID Cathlyn Parsons, PA-C   1 tablet at 05/22/21 9381   sodium bicarbonate tablet 650 mg  650 mg Oral BID Cathlyn Parsons, PA-C   650 mg at 05/22/21 0175   sodium chloride flush (NS) 0.9 % injection 10 mL  10 mL Intracatheter Q12H Raulkar, Clide Deutscher, MD   10 mL at 05/22/21 1025   vitamin B-12 (CYANOCOBALAMIN) tablet 1,000 mcg  1,000 mcg Oral Daily Cathlyn Parsons, PA-C   1,000 mcg at 05/22/21 8527   zinc sulfate capsule 220 mg  220 mg Oral Daily Cathlyn Parsons, PA-C   220 mg at 05/22/21 7824     Discharge Medications: Please see discharge summary for a list of discharge medications.  Relevant Imaging Results:  Relevant Lab Results:   Additional Information    Dyanne Iha

## 2021-05-22 NOTE — Progress Notes (Signed)
PA Olin Hauser notified of pt increased bleeding at heparin injection sites. PA Olin Hauser assessing pt at this time. Sheela Stack, LPN

## 2021-05-22 NOTE — Progress Notes (Signed)
Occupational Therapy Session Note  Patient Details  Name: [name] MRN: [id number] Date of Birth: 02/12/1931  Today's Date: 05/22/2021 OT Individual Time: 1301-1404 OT Individual Time Calculation (min): 63 min    Short Term Goals: Week 1:  OT Short Term Goal 1 (Week 1): Pt will sit > stand in prep for functional task with LRAD and min A OT Short Term Goal 1 - Progress (Week 1): Met OT Short Term Goal 2 (Week 1): Pt will donn pants with mod A OT Short Term Goal 2 - Progress (Week 1): Met OT Short Term Goal 3 (Week 1): Pt will complete toilet transfer with min A OT Short Term Goal 3 - Progress (Week 1): Met Week 2:  OT Short Term Goal 1 (Week 2): STG = LTG due to ELOS  Skilled Therapeutic Interventions/Progress Updates:    Pt received asleep in recliner, but easily awakened to voice, no c/o pain, agreeable to therapy. Session focus on activity tolerance, transfer retraining, dynamic standing balance, BUE/BLE strengthening, and energy conservation education in prep for improved ADL/IADL/func mobility performance + decreased caregiver burden. Sit to stand throughout session with min to CGA  to facilitiate sufficient forward trunk flexion and completing short ambulatory transfers throughout session with RW + CGA. Declined need for ADL. Total A w/c transport to and from gym for time management and energy conservation.  Played 3 rounds of corn hole in stance with only CGA for balance throughout with use of RW. Able to complete 3 STS with CGA to facilitate forward trunk flexion and mod Vcs for split hand technique. Able to retrieve bean bags from cross midline and behind him on the mat with no LOB. Able to tally up points each round, but unable to recall previous rounds' point total.  Completed 3x2 min bouts on nustep at 5/10 resistance and an average of 50 steps/min. Rated activity as "pretty tiring" and educated on importance of self-monitoring fatigue level to decrease falls risk. SatO2 at  97+% on RA and HR at 73 bpm post activity.   Pt complete short ambulatory transfer > bed with CGA and RW, returned to supine with S.  Pt left semi-reclined in bed with friends present with bed alarm engaged, call bell in reach, and all immediate needs met.    Therapy Documentation Precautions:  Precautions Precautions: Fall Restrictions Weight Bearing Restrictions: No  Pain:  No c/o throughout ADL: See Care Tool for more details.  Therapy/Group: Individual Therapy  Volanda Napoleon MS, OTR/L  05/22/2021, 6:59 AM

## 2021-05-22 NOTE — Progress Notes (Signed)
Patient had had some bleeding from heparin injections every 8 hours--usually resolves and no echymosis or hematomas noted on abdomen. Will change to lovenox 30 mg/daily. Will recheck BMET/CBC in am as has been a week. Will change out SP cath today as has completed IV fortaz for UTI.

## 2021-05-22 NOTE — Progress Notes (Signed)
Physical Therapy Session Note  Patient Details  Name: [name] MRN: [id number] Date of Birth: 05/14/1931  Today's Date: 05/22/2021 PT Individual Time: 6286-3817 PT Individual Time Calculation (min): 61 min  and Today's Date: 05/22/2021 PT Missed Time: 14 Minutes Missed Time Reason: Patient fatigue  Short Term Goals: Week 1:  PT Short Term Goal 1 (Week 1): Pt will perform sit <>stand w/mod A and LRAD PT Short Term Goal 2 (Week 1): Pt will ambulate 25' w/LRAD and mod A PT Short Term Goal 3 (Week 1): Pt will perform bed <>chair transfers w/LRAD and mod A PT Short Term Goal 4 (Week 1): Pt will demonstrate sustained attention to task for 2 mintues w/S* and min verbal cues   Skilled Therapeutic Interventions/Progress Updates:  Pt received supine in bed, lethargic but awake. Pt denied pain and was agreeable to PT. Emphasis of session on improved activity tolerance, transfers and gait training. Pt performed supine <> sit EOB w/S* and sit <>stand to RW w/min A. Pt ambulated 10' to [facility] w/RW and CGA. Pt transported to ortho gym w/total A for time management. Pt performed sit <>stand from [facility] w/min A for BLE weakness and completed car transfer w/CGA and RW. Noted good sequencing to get into/out of car and proper hand placement without cues. Pt then ascended /descended ramp w/RW and CGA, noted adequate step clearance but decreased step length and poor management of AD as he fatigued. Min verbal cues to maintain safe distance to RW.   Pt transported to 5N hallway for gait training w/total A for time management. Pt performed sit <>stands from [facility] w/min A and ambulated 10' and 100' w/RW and CGA. Noted downward gaze, decreased step length/clearance bilaterally and poor management of AD as pt fatigued. Min verbal cues for diaphragmatic breathing for improved cardiovascular endurance and pacing and to maintain forward head posture. Pt reported significant fatigue and requested to return to bed. Pt transported back  to room w/total A 2/2 fatigue and performed sit <>stand pivot from WC to EOB w/RW and min A. Sit <>supine w/S* and pt rolled to L and R side w/S* and use of bedrail to change brief w/total A. Pt was left supine in bed, all needs in reach. Missed 14 minutes of skilled PT due to pt fatigue.    Therapy Documentation Precautions:  Precautions Precautions: Fall Restrictions Weight Bearing Restrictions: No   Therapy/Group: Individual Therapy Cruzita Lederer Khadejah Son, PT, DPT  05/22/2021, 7:55 AM

## 2021-05-22 NOTE — Progress Notes (Signed)
Pt's CBG was 67 when checked at 5:31a.  This LPN gave pt orange juice then retook CBG getting a reading of 93 at 5:55a.

## 2021-05-22 NOTE — Progress Notes (Signed)
Suprapubic cath inserted per order. Sterile field maintained. 47 F inserted with yellow urine return noted. No complications noted. Pt tolerated well. [name] Plan, LPN

## 2021-05-23 LAB — BASIC METABOLIC PANEL
Anion gap: 7 (ref 5–15)
BUN: 34 mg/dL — ABNORMAL HIGH (ref 8–23)
CO2: 23 mmol/L (ref 22–32)
Calcium: 8.6 mg/dL — ABNORMAL LOW (ref 8.9–10.3)
Chloride: 108 mmol/L (ref 98–111)
Creatinine, Ser: 2.02 mg/dL — ABNORMAL HIGH (ref 0.61–1.24)
GFR, Estimated: 31 mL/min — ABNORMAL LOW (ref >60)
Glucose, Bld: 85 mg/dL (ref 70–99)
Potassium: 5 mmol/L (ref 3.5–5.1)
Sodium: 138 mmol/L (ref 135–145)

## 2021-05-23 LAB — GLUCOSE, CAPILLARY
Glucose-Capillary: 95 mg/dL (ref 70–99)
Glucose-Capillary: 125 mg/dL — ABNORMAL HIGH (ref 70–99)
Glucose-Capillary: 152 mg/dL — ABNORMAL HIGH (ref 70–99)
Glucose-Capillary: 152 mg/dL — ABNORMAL HIGH (ref 70–99)

## 2021-05-23 MED ORDER — HYDRALAZINE HCL 50 MG PO TABS
100.0000 mg | ORAL_TABLET | Freq: Two times a day (BID) | ORAL | Status: DC
  Administered 2021-05-23 – 2021-05-27 (×8): 100 mg via ORAL
  Filled 2021-05-23 (×8): qty 2

## 2021-05-23 MED ORDER — HYDRALAZINE HCL 50 MG PO TABS
50.0000 mg | ORAL_TABLET | Freq: Every day | ORAL | Status: DC
  Administered 2021-05-23 – 2021-05-26 (×4): 50 mg via ORAL
  Filled 2021-05-23 (×4): qty 1

## 2021-05-23 MED ORDER — METFORMIN HCL 500 MG PO TABS
500.0000 mg | ORAL_TABLET | Freq: Two times a day (BID) | ORAL | Status: DC
  Administered 2021-05-23 – 2021-05-28 (×10): 500 mg via ORAL
  Filled 2021-05-23 (×10): qty 1

## 2021-05-23 MED ORDER — HYDRALAZINE HCL 50 MG PO TABS: 85.0000 mg | ORAL_TABLET | Freq: Three times a day (TID) | ORAL | Status: DC

## 2021-05-23 NOTE — Progress Notes (Signed)
Dr. Ranell Patrick made aware of critical result HBG.  Dayna Ramus

## 2021-05-23 NOTE — Progress Notes (Signed)
Physical Therapy Weekly Progress Note  Patient Details  Name: [name] MRN: [id number] Date of Birth: May 17, 1931  Beginning of progress report period: May 14, 2021 End of progress report period: May 23, 2021  Today's Date: 05/23/2021 PT Individual Time: 0800-0853 PT Individual Time Calculation (min): 53 min   Patient has met 4 of 4 short term goals. Pt demonstrates gradual progress toward long term goals. Pt currently able to roll and transfer sit<>supine with supervision but requires min A for supine<>sit. Pt able to perform transfers with RW and min A and ambulate up to 169ft with RW and CGA. Pt continues to be limited by generalized weakness/deconditioning, SOB/fatigue, decreased balance, and overall decreased endurance with activity. Pt's daughter has observed therapy sessions but would benefit from hands on family education training prior to D/C.   Patient continues to demonstrate the following deficits muscle weakness, decreased cardiorespiratoy endurance, decreased coordination, decreased problem solving, and decreased standing balance, decreased postural control, and decreased balance strategies and therefore will continue to benefit from skilled PT intervention to increase functional independence with mobility.  Patient progressing toward long term goals..  Continue plan of care.  PT Short Term Goals Week 1:  PT Short Term Goal 1 (Week 1): Pt will perform sit <>stand w/mod A and LRAD PT Short Term Goal 1 - Progress (Week 1): Met PT Short Term Goal 2 (Week 1): Pt will ambulate 25' w/LRAD and mod A PT Short Term Goal 2 - Progress (Week 1): Met PT Short Term Goal 3 (Week 1): Pt will perform bed <>chair transfers w/LRAD and mod A PT Short Term Goal 3 - Progress (Week 1): Met PT Short Term Goal 4 (Week 1): Pt will demonstrate sustained attention to task for 2 mintues w/S* and min verbal cues PT Short Term Goal 4 - Progress (Week 1): Met Week 2:  PT Short Term Goal 1 (Week  2): STG=LTG due to LOS  Skilled Therapeutic Interventions/Progress Updates:  Ambulation/gait training;Community reintegration;DME/adaptive equipment instruction;Neuromuscular re-education;Psychosocial support;UE/LE Strength taining/ROM;Wheelchair propulsion/positioning;Stair training;Balance/vestibular training;Discharge planning;Functional electrical stimulation;Pain management;Skin care/wound management;Therapeutic Activities;UE/LE Coordination activities;Visual/perceptual remediation/compensation;Therapeutic Exercise;Functional mobility training;Disease management/prevention;Cognitive remediation/compensation;Patient/family education;Splinting/orthotics   Today's Interventions: Received pt supine in bed asleep, pt easily woken and agreeable to PT treatment, and denied any pain during session. Session with emphasis on functional mobility/transfers, generalized strengthening, dynamic standing balance/coordination, gait training, and endurance. Pt transferred supine<>sitting EOB with min HHA with HOB slightly elevated and use of bedrails. Stand<>pivot bed<>WC with RW and min A and pt transported to/from room in [facility] total A for time management purposes. Sit<>stands with RW and min A from WC throughout session and pt ambulated 44ft x 1 and 139ft x 1 with RW and CGA. Pt required multiple seated rest/water breaks throughout due to fatigue and SOB. Worked on dynamic standing balance tapping each LE to 3 cones x 3 reps bilaterally with BUE support and CGA/light min A for balance. Pt then performed TUG with RW and CGA for the following trials with average of 49.5 seconds. Pt educated on test results/significance indicating high fall risk as well as importance of using RW upon discharge. Pt reports having a RW, rollator, and WC at home and plans on using RW to ambulate through the home and the [facility] for longer distances.  -Trial 1: 47 seconds -Trial 2: 52 seconds Stand<>pivot mat<>WC with RW and min A. Pt requested to  return to bed and ambulated 75ft with RW and CGA back to bed and transferred sit<>supine with supervision. Concluded session  with pt supine in bed, needs within reach, and bed alarm on.   Therapy Documentation Precautions:  Precautions Precautions: Fall Restrictions Weight Bearing Restrictions: No  Therapy/Group: Individual Therapy Alfonse Alpers PT, DPT  05/23/2021, 7:16 AM

## 2021-05-23 NOTE — Progress Notes (Signed)
PROGRESS NOTE   Subjective/Complaints: Much more alert this AM Discussed improvement in his hemoglobin to 6,3 Discussed with nursing that medications can be given as prescribed despite critical value  ROS:  Pt denies SOB, abd pain, CP, N/V/C/D, and vision changes, pain    Objective:   No results found. Recent Labs    05/23/21 0617  WBC 2.8*  HGB 6.3*  HCT 19.7*  PLT 230    Recent Labs    05/23/21 0617  NA 138  K 5.0  CL 108  CO2 23  GLUCOSE 85  BUN 34*  CREATININE 2.02*  CALCIUM 8.6*     Intake/Output Summary (Last 24 hours) at 05/23/2021 1219 Last data filed at 05/23/2021 0813 Gross per 24 hour  Intake 360 ml  Output 2250 ml  Net -1890 ml     Pressure Injury 05/05/21 Buttocks Right Stage 1 -  Intact skin with non-blanchable redness of a localized area usually over a bony prominence. small open area on right buttocks (Active)  05/05/21 1700  Location: Buttocks  Location Orientation: Right  Staging: Stage 1 -  Intact skin with non-blanchable redness of a localized area usually over a bony prominence.  Wound Description (Comments): small open area on right buttocks  Present on Admission:     Physical Exam: Vital Signs Blood pressure (!) 152/63, pulse 68, temperature 98.6 F (37 C), temperature source (P) Oral, resp. rate 14, SpO2 93 %. Gen: no distress, normal appearing HEENT: oral mucosa pink and moist, NCAT Cardio: Reg rate Chest: normal effort, normal rate of breathing Abd: soft, non-distended Ext: no edema Psychiatric: appropriate; flat, but interactive, significant improvement in alertness Neurological: alert Musculoskeletal:     Cervical back: Normal range of motion. No rigidity.     Comments: LUE/LLE 5-/5 RUE 4/5 except R hand- grip 3-/5 and FA 2-/5- has hx of chronic R hand damage- held open - not using- except as secondary support when eating RLE_ 4+/5 in HF/KE/KF/DF and PF  Skin:     General: Skin is warm and dry.     Comments: SPC and Perc drain as above IV's in L AC fossa and R wrist- look OK  Neurological:     Comments: Patient is alert.  Makes eye contact with examiner.  Provides name and age.  Follows commands.  Limited but fair medical historian. HOH- but making some jokes- Ox3 upon questioning Sensation to light touch intact x4 except R hand  Psychiatric:        Mood and Affect: Mood normal.        Behavior: Behavior normal.    Assessment/Plan: 1. Functional deficits which require 3+ hours per day of interdisciplinary therapy in a comprehensive inpatient rehab setting. Physiatrist is providing close team supervision and 24 hour management of active medical problems listed below. Physiatrist and rehab team continue to assess barriers to discharge/monitor patient progress toward functional and medical goals  Care Tool:  Bathing    Body parts bathed by patient: Abdomen   Body parts bathed by helper: Front perineal area, Buttocks, Right lower leg, Left lower leg     Bathing assist Assist Level: Set up assist  Upper Body Dressing/Undressing Upper body dressing   What is the patient wearing?: Pull over shirt    Upper body assist Assist Level: Set up assist    Lower Body Dressing/Undressing Lower body dressing      What is the patient wearing?: Pants, Incontinence brief     Lower body assist Assist for lower body dressing: Minimal Assistance - Patient > 75%     Toileting Toileting    Toileting assist Assist for toileting: Total Assistance - Patient < 25%     Transfers Chair/bed transfer  Transfers assist     Chair/bed transfer assist level: Minimal Assistance - Patient > 75%     Locomotion Ambulation   Ambulation assist   Ambulation activity did not occur: Safety/medical concerns (Severe lethargy, global deconditioning)  Assist level: Contact Guard/Touching assist Assistive device: Walker-rolling Max distance: 149ft    Walk 10 feet activity   Assist  Walk 10 feet activity did not occur: Safety/medical concerns (Severe lethargy, global deconditioning)  Assist level: Contact Guard/Touching assist Assistive device: Walker-rolling   Walk 50 feet activity   Assist Walk 50 feet with 2 turns activity did not occur: Safety/medical concerns (Severe lethargy, global deconditioning)  Assist level: Contact Guard/Touching assist Assistive device: Walker-rolling    Walk 150 feet activity   Assist Walk 150 feet activity did not occur: Safety/medical concerns (Severe lethargy, global deconditioning)         Walk 10 feet on uneven surface  activity   Assist Walk 10 feet on uneven surfaces activity did not occur: Safety/medical concerns (Severe lethargy, global deconditioning)   Assist level: Contact Guard/Touching assist Assistive device: Walker-rolling   Wheelchair     Assist Is the patient using a wheelchair?: Yes Type of Wheelchair: Manual    Wheelchair assist level: Dependent - Patient 0%      Wheelchair 50 feet with 2 turns activity    Assist        Assist Level: Dependent - Patient 0%   Wheelchair 150 feet activity     Assist      Assist Level: Dependent - Patient 0%   Blood pressure (!) 152/63, pulse 68, temperature 98.6 F (37 C), temperature source (P) Oral, resp. rate 14, SpO2 93 %.  Medical Problem List and Plan: 1. 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  Impaired mobility: Continue Subcutaneous heparin q8H.             -antiplatelet therapy: Aspirin 81 mg daily and Plavix 75 mg day x3 weeks then Plavix alone 3. Pain: N/A 4.  Mood: Provide emotional support             -antipsychotic agents: N/A 5. Neuropsych: This patient is capable of making decisions on his own behalf. 6. Skin/Wound Care: Routine skin checks 7. Fluids/Electrolytes/Nutrition: Routine in and outs with follow-up chemistries 8.  Hypertension.  SBP elevated and DBP normal decreased Coreg to 6.25mg  BID given bradycardia below. Increase Hydralazine to 85mg  TID.  9.  History of CAD with CABG.  Continue Imdur 60 mg twice daily 10.  COVID-19 pneumonia.  Treated with remdesivir and steroid.  Isolation discontinued 12/23 11.  Symptomatic anemia.  Declines blood product due to Jehovah witness..  Continue Aranesp, Hgb 5.9, up to 6.3, discussed with patient, continue to monitor as needed. Iron tablet started 12.  Diabetes mellitus.  Hemoglobin A1c 8.5.  SSI.  Patient on Glucophage 500 mg twice daily as well as Amaryl 4 mg daily prior to admission.  Resume as needed  CBG 90-144 d/c ISS. D.c amaryl and increase metformin to 500mg . Cr at baseline 13.  Percutaneous cholecystostomy drain malfunction.  Patient hospitalized [facility] in October for cholecystitis.  Continue Cardura.  Not a surgical candidate status post percutaneous cholecystostomy drain. 14.  Hyperlipidemia.  Zetia 15.  Pseudomonas UTI.  Completing course of IV Fortaz. 16. Constipation- LBM 1 week ago- will give sorbitol and monitor.   1/2- LBM 2 days ago- denies constipaiton- if no BM by tomorrow, suggest Sorbitol 17. Bradycardia: decrease Coreg to 6.25mg  BID 18. Stage 3 CKD: baseline, monitor Mondays.  19. Impaired cognition: continue SLP 20. Impaired problem solving: continue SLP.     LOS: 10 days A FACE TO FACE EVALUATION WAS PERFORMED  Martha Clan P Miriya Cloer 05/23/2021, 12:19 PM

## 2021-05-23 NOTE — Progress Notes (Signed)
Speech Language Pathology Daily Session Note  Patient Details  Name: [name] MRN: [id number] Date of Birth: 16-Sep-1930  Today's Date: 05/23/2021 SLP Individual Time: 0900-0945 SLP Individual Time Calculation (min): 45 min  Short Term Goals: Week 2: SLP Short Term Goal 1 (Week 2): STG=LTG's (ELOS: anticipated discharge 1/11)  Skilled Therapeutic Interventions:   Patient seen for skilled ST session focused on cognitive function goals. Patient was awake, sitting up in bed and watching TV. He was agreeable to session but affect was fairly flat. Initial task was reading a short paragraph, summarizing it and answering some basic multiple choice questions. Patient was able to summarize and retell very basic, general level information but without any specifics. When instructed to answer multiple choice questions, patient would read through each choice but not pick one. He required modA verbal cues to attempt to make a single choice. He was noticeably disinterested in this task and SLP then transitioned to a novel card game. Patient did demonstrate task learning with repeated trials with min-modA cues to direct and redirect attention, however he was never able to demonstrate consistent performance. 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 Patient started to fall asleep with 15 minutes left in session. He was left in bed with all needs within reach. He continues to benefit from skilled SLP intervention to maximize cognitive-linguistic function prior to discharge.  Pain Pain Assessment Pain Scale: 0-10 Pain Score: 0-No pain  Therapy/Group: Individual Therapy  Sonia Baller, MA, CCC-SLP Speech Therapy

## 2021-05-23 NOTE — Progress Notes (Signed)
Occupational Therapy Session Note  Patient Details  Name: [name] MRN: [id number] Date of Birth: 10-26-1930  Today's Date: 05/23/2021 OT Individual Time: 5701-7793 OT Individual Time Calculation (min): 57 min    Short Term Goals: Week 2:  OT Short Term Goal 1 (Week 2): STG = LTG due to ELOS  Skilled Therapeutic Interventions/Progress Updates:  Skilled OT intervention completed with focus on activity tolerance, dynamic balance, simulated toileting tasks, cognitive sequencing and functional ambulation. Pt received in side-lying, with LPN and NT in room checking vitals and flushing catheter/Cholecystostomy tube. Pt missed 18 mins of OT intervention due to nursing care. Revisited pt, with pt supine in bed and agreeable to session. Pt completed bed mobility with supervision, then sit > stand with mod A using RW and stand pivot to w/c with CGA. Pt transported to therapy gym with total A in w/c for energy conservation. Sit > stand using RW with min A then stand pivot to EOM with CGA (and rest of session). Pt participated in simulated toileting/LB dressing task with clips with pt in standing with using RW and CGA-supervisionf or balance only. Pt encouraged to reach behind back and retrieve clips with R hand however pt with max difficulty, but able to retrieve all with L hand. Pt able to tolerate about 5 mins in standing with pt expressing fatigue. Intermittent seated rest breaks needed for decreased endurance. Completed R hand focus of coordination with functional reaching task with clip tower. Pt with min/mod difficulty fine motor pinching, with compensatory gross grasp noted. Pt participated in pipe tower task while seated EOM to promote bilateral integration of BUE in functional task as well as for cognitive sequencing. Pt initially with mod difficulty and cues needed for correcting mistakes and incorporating R hand into task however fading difficulty to min-no cues needed for sequencing or use of R  hand. Pt ambulated about 100 ft using RW with CGA-supervision, with w/c follow for safety to promote endurance needed for functional ambulation within pt's home. Pt SOB at end of walk, with cues needed for pursed lip breathing. Pt completed EOB > supine with supervision, with pt left semi-supine in bed, bed alarm on and all needs in reach at end of session.   Therapy Documentation Precautions:  Precautions Precautions: Fall Restrictions Weight Bearing Restrictions: No  Pain: No c/o pain   Therapy/Group: Individual Therapy  [name] E Billyjoe Go 05/23/2021, 7:54 AM

## 2021-05-24 LAB — GLUCOSE, CAPILLARY
Glucose-Capillary: 65 mg/dL — ABNORMAL LOW (ref 70–99)
Glucose-Capillary: 75 mg/dL (ref 70–99)
Glucose-Capillary: 84 mg/dL (ref 70–99)
Glucose-Capillary: 76 mg/dL (ref 70–99)
Glucose-Capillary: 192 mg/dL — ABNORMAL HIGH (ref 70–99)

## 2021-05-24 LAB — CBC
HCT: 19.7 % — ABNORMAL LOW (ref 39.0–52.0)
Hemoglobin: 6.3 g/dL — CL (ref 13.0–17.0)
MCH: 31.7 pg (ref 26.0–34.0)
MCHC: 32 g/dL (ref 30.0–36.0)
MCV: 99 fL (ref 80.0–100.0)
Platelets: 230 10*3/uL (ref 150–400)
RBC: 1.99 MIL/uL — ABNORMAL LOW (ref 4.22–5.81)
RDW: 24.4 % — ABNORMAL HIGH (ref 11.5–15.5)
WBC: 2.8 10*3/uL — ABNORMAL LOW (ref 4.0–10.5)
nRBC: 6.1 % — ABNORMAL HIGH (ref 0.0–0.2)

## 2021-05-24 NOTE — Progress Notes (Addendum)
Hypoglycemic Event  CBG: 65  Treatment: 4 oz juice/soda  Symptoms: None  Follow-up CBG: [id number] CBG Result:75  Possible Reasons for Event: Unknown  Comments/MD notified:Secure chat send to MD Lovorn Will report off to oncoming shift    Trino Higinbotham, Warnell Bureau

## 2021-05-25 LAB — GLUCOSE, CAPILLARY
Glucose-Capillary: 83 mg/dL (ref 70–99)
Glucose-Capillary: 127 mg/dL — ABNORMAL HIGH (ref 70–99)
Glucose-Capillary: 183 mg/dL — ABNORMAL HIGH (ref 70–99)
Glucose-Capillary: 201 mg/dL — ABNORMAL HIGH (ref 70–99)

## 2021-05-25 NOTE — Progress Notes (Signed)
PROGRESS NOTE   Subjective/Complaints:   Pt is sleepy- LBM yesterday. Still fatigued.  No issues.   ROS:   Pt denies SOB, abd pain, CP, N/V/C/D, and vision changes    Objective:   No results found. Recent Labs    05/23/21 0617  WBC 2.8*  HGB 6.3*  HCT 19.7*  PLT 230    Recent Labs    05/23/21 0617  NA 138  K 5.0  CL 108  CO2 23  GLUCOSE 85  BUN 34*  CREATININE 2.02*  CALCIUM 8.6*     Intake/Output Summary (Last 24 hours) at 05/25/2021 1319 Last data filed at 05/25/2021 1313 Gross per 24 hour  Intake 230 ml  Output 2200 ml  Net -1970 ml     Pressure Injury 05/05/21 Buttocks Right Stage 1 -  Intact skin with non-blanchable redness of a localized area usually over a bony prominence. small open area on right buttocks (Active)  05/05/21 1700  Location: Buttocks  Location Orientation: Right  Staging: Stage 1 -  Intact skin with non-blanchable redness of a localized area usually over a bony prominence.  Wound Description (Comments): small open area on right buttocks  Present on Admission:     Physical Exam: Vital Signs Blood pressure (!) 139/48, pulse 69, temperature 98 F (36.7 C), resp. rate 19, SpO2 92 %.    General: awake, but sleepy- laying on L side in bed; on air mattress;  NAD HENT: conjugate gaze; oropharynx moist CV: regular rate; no JVD Pulmonary: CTA B/L; no W/R/R- good air movement GI: soft, NT, ND, (+)BS Psychiatric: appropriate- sleepy Neurological: alert, but sleepy Psychiatric: appropriate; flat, but interactive, significant improvement in alertness Neurological: alert Musculoskeletal:     Cervical back: Normal range of motion. No rigidity.     Comments: LUE/LLE 5-/5 RUE 4/5 except R hand- grip 3-/5 and FA 2-/5- has hx of chronic R hand damage- held open - not using- except as secondary support when eating RLE_ 4+/5 in HF/KE/KF/DF and PF  Skin:    General: Skin is warm and  dry.     Comments: SPC and Perc drain as above IV's in L AC fossa and R wrist- look OK  Neurological:     Comments: Patient is alert.  Makes eye contact with examiner.  Provides name and age.  Follows commands.  Limited but fair medical historian. HOH- but making some jokes- Ox3 upon questioning Sensation to light touch intact x4 except R hand  Psychiatric:        Mood and Affect: Mood normal.        Behavior: Behavior normal.    Assessment/Plan: 1. Functional deficits which require 3+ hours per day of interdisciplinary therapy in a comprehensive inpatient rehab setting. Physiatrist is providing close team supervision and 24 hour management of active medical problems listed below. Physiatrist and rehab team continue to assess barriers to discharge/monitor patient progress toward functional and medical goals  Care Tool:  Bathing    Body parts bathed by patient: Abdomen   Body parts bathed by helper: Front perineal area, Buttocks, Right lower leg, Left lower leg     Bathing assist Assist Level: Set up assist  Upper Body Dressing/Undressing Upper body dressing   What is the patient wearing?: Pull over shirt    Upper body assist Assist Level: Set up assist    Lower Body Dressing/Undressing Lower body dressing      What is the patient wearing?: Pants, Incontinence brief     Lower body assist Assist for lower body dressing: Minimal Assistance - Patient > 75%     Toileting Toileting    Toileting assist Assist for toileting: Total Assistance - Patient < 25%     Transfers Chair/bed transfer  Transfers assist     Chair/bed transfer assist level: Minimal Assistance - Patient > 75%     Locomotion Ambulation   Ambulation assist   Ambulation activity did not occur: Safety/medical concerns (Severe lethargy, global deconditioning)  Assist level: Contact Guard/Touching assist Assistive device: Walker-rolling Max distance: 169ft   Walk 10 feet  activity   Assist  Walk 10 feet activity did not occur: Safety/medical concerns (Severe lethargy, global deconditioning)  Assist level: Contact Guard/Touching assist Assistive device: Walker-rolling   Walk 50 feet activity   Assist Walk 50 feet with 2 turns activity did not occur: Safety/medical concerns (Severe lethargy, global deconditioning)  Assist level: Contact Guard/Touching assist Assistive device: Walker-rolling    Walk 150 feet activity   Assist Walk 150 feet activity did not occur: Safety/medical concerns (Severe lethargy, global deconditioning)         Walk 10 feet on uneven surface  activity   Assist Walk 10 feet on uneven surfaces activity did not occur: Safety/medical concerns (Severe lethargy, global deconditioning)   Assist level: Contact Guard/Touching assist Assistive device: Walker-rolling   Wheelchair     Assist Is the patient using a wheelchair?: Yes Type of Wheelchair: Manual    Wheelchair assist level: Dependent - Patient 0%      Wheelchair 50 feet with 2 turns activity    Assist        Assist Level: Dependent - Patient 0%   Wheelchair 150 feet activity     Assist      Assist Level: Dependent - Patient 0%   Blood pressure (!) 139/48, pulse 69, temperature 98 F (36.7 C), resp. rate 19, SpO2 92 %.  Medical Problem List and Plan: 1. 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  Impaired mobility: Continue Subcutaneous heparin q8H.             -antiplatelet therapy: Aspirin 81 mg daily and Plavix 75 mg day x3 weeks then Plavix alone 3. Pain: N/A 4. Mood: Provide emotional support              -antipsychotic agents: N/A 5. Neuropsych: This patient is capable of making decisions on his own behalf. 6. Skin/Wound Care: Routine skin checks 7. Fluids/Electrolytes/Nutrition: Routine in and outs with follow-up chemistries 8.  Hypertension.  SBP elevated and DBP normal decreased Coreg to 6.25mg  BID given bradycardia below. Increase Hydralazine to 85mg  TID. 1/8- BP controlled- con't regimen  9.  History of CAD with CABG.  Continue Imdur 60 mg twice daily 10.  COVID-19  pneumonia.  Treated with remdesivir and steroid.  Isolation discontinued 12/23 11.  Symptomatic anemia.  Declines blood product due to Jehovah witness..  Continue Aranesp, Hgb 5.9, up to 6.3, discussed with patient, continue to monitor as needed. Iron tablet started 12.  Diabetes mellitus.  Hemoglobin A1c 8.5.  SSI.  Patient on Glucophage 500 mg twice daily as well as Amaryl 4 mg daily prior to admission.  Resume as needed  CBG 90-144 d/c ISS. D.c amaryl and increase metformin to 500mg . Cr at baseline  1/8- Bgs 83-192- con't regimen 13.  Percutaneous cholecystostomy drain malfunction.  Patient hospitalized [facility] in October for cholecystitis.  Continue Cardura.  Not a surgical candidate status post percutaneous cholecystostomy drain. 14.  Hyperlipidemia.  Zetia 15.  Pseudomonas UTI.  Completing course of IV Fortaz. 16. Constipation- LBM 1 week ago- will give sorbitol and monitor.   1/2- LBM 2 days ago- denies constipaiton- if no BM by tomorrow, suggest Sorbitol  1/8- LBM yesterday per pt.  17. Bradycardia: decrease Coreg to 6.25mg  BID 18. Stage 3 CKD: baseline, monitor Mondays.  19. Impaired cognition: continue SLP 20. Impaired problem solving: continue SLP.     LOS: 12 days A FACE TO FACE EVALUATION WAS PERFORMED  Roneisha Stern 05/25/2021, 1:19 PM

## 2021-05-25 NOTE — Progress Notes (Signed)
Occupational Therapy Session Note  Patient Details  Name: [name] MRN: [id number] Date of Birth: 04-13-1931  Today's Date: 05/25/2021 OT Individual Time: 1300-1410 OT Individual Time Calculation (min): 70 min    Short Term Goals: Week 2:  OT Short Term Goal 1 (Week 2): STG = LTG due to ELOS  Skilled Therapeutic Interventions/Progress Updates:    Pt resting in bed upon arrival with daughter present. Daughter present throughout session. Supine>sit EOB with supervision using bed rails and HOB elevated. Sitting balance with supervision. Sit<>stand from EOB with mod A on 2nd attempt. Transfer to w/c with CGA using RW. Pt transported to [facility] gym for time mgmt. Focus on sit<>stand from varying surface heights. Daughter estimated height of chair pt will be sitting in at home. Sit<>stand from varying heights with CGA initially fading to min A with fatigue. Pt fatigues after sit<standX1 and requies rest break X 5 mins between each sit<>stand. Standing balance with CGA. Pt has rollator and RW at home. Recommended RW for use at home. Recommended 24 hour supervision. Daughter states this is the plan but there could be times when he will be alone for short periods. Recommended to daughter that she make sure pt is cognitively aware that he is not to get up without someone next to him. Pt amb approx 50' back towards room before requesting to sit in w/c. Pt returned to room and tranfserred to bed. Sit>supine with supervision (bed flat). Pt remained in bed with all needs within reach and daughter present.   Therapy Documentation Precautions:  Precautions Precautions: Fall Restrictions Weight Bearing Restrictions: No Pain:  Pt denies pain this afternoon   Therapy/Group: Individual Therapy  Leroy Libman 05/25/2021, 2:45 PM

## 2021-05-25 NOTE — Progress Notes (Signed)
Physical Therapy Session Note  Patient Details  Name: [name] MRN: [id number] Date of Birth: 05-Apr-1931  Today's Date: 05/25/2021 PT Individual Time: 0905-0955 PT Individual Time Calculation (min): 50 min   Short Term Goals: Week 2:  PT Short Term Goal 1 (Week 2): STG=LTG due to LOS  Skilled Therapeutic Interventions/Progress Updates: Pt presented in bed agreeable to therapy. Pt denies pain at start of session but c/o pain in R knee with activity, rest breaks provided as needed for pain management. Pt performed bed mobility with minA and use of bed features with pt able to manage BLE independently but requiring assist for truncal support. Pt required modA for Sit to stand from EOB with x 2 attempts however improved to CGA through session. Performed ambulatory transfer to w/c from bed with CGA and transported to 4th floor rehab gym for energy conservation and time management. Performed ambulatory transfer to high/low mat and participated in Sit to stand x 5 for BLE strengthening. Pt also participated in toe taps to 4in step x 6 then x 4, pt required seated rest break as initial goal was 10 due to fatigue. Pt ambulated ~68ft but terminated due to increased c/o pain in R knee and pt stating "feels like it's going to give". Pt transported back to 5th floor and participated in NuStep L3 x 6 min for general conditioning. Pt maintained 40-50SPM but required rest breaks at approx each min due to fatigue. Pt encouraged pt decrease SPM for energy conservation. Pt then returned to w/c and transported back to room. Pt agreeable to sit in recliner after session and performed ambulatory transfer with RW and CGA. Pt left in recliner at end of session with belt alarm on, call bell within reach and needs met.       Therapy Documentation Precautions:  Precautions Precautions: Fall Restrictions Weight Bearing Restrictions: No General:   Vital Signs:  Pain:   Mobility:   Locomotion :     Trunk/Postural Assessment :    Balance:   Exercises:   Other Treatments:      Therapy/Group: Individual Therapy  Shayla Heming 05/25/2021, 12:37 PM

## 2021-05-26 ENCOUNTER — Ambulatory Visit: Payer: Medicare Other | Admitting: Cardiology

## 2021-05-26 LAB — GLUCOSE, CAPILLARY
Glucose-Capillary: 125 mg/dL — ABNORMAL HIGH (ref 70–99)
Glucose-Capillary: 121 mg/dL — ABNORMAL HIGH (ref 70–99)
Glucose-Capillary: 156 mg/dL — ABNORMAL HIGH (ref 70–99)
Glucose-Capillary: 213 mg/dL — ABNORMAL HIGH (ref 70–99)

## 2021-05-26 NOTE — Progress Notes (Signed)
Speech Language Pathology Daily Session Note  Patient Details  Name: [name] MRN: [id number] Date of Birth: 19-May-1930  Today's Date: 05/26/2021 SLP Individual Time: 1101-1200 SLP Individual Time Calculation (min): 59 min  Short Term Goals: Week 2: SLP Short Term Goal 1 (Week 2): STG=LTG's (ELOS: anticipated discharge 1/11)  Skilled Therapeutic Interventions:Skilled ST services focused on cognitive skills and education. Pt's daughter present and participating in treatment session. Pt expressed ongoing fatigue impacting cognitive skills and participation. Pt drifted off to sleep several times throughout the session, but was easily awoken with verbal cues. SLP facilitated assessment of cognitive linguistic skills administering SLUMS, pt scored 9/30 (n>25) with primary impairments in sustained attention/alertness, short term/immediate recall, basic problem solving/following commands and recognizing errors. 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 Pt was able to name and describe actions in pictures with min A verbal cues for specific diction verse circumlocution. SLP provided education to pt and daughter pertaining to acute deficits and need for assistance in basic problem solving task, following commands, recall, sustained attention and emergent awareness. Pt's daughter appeared overwhelmed with logistics of 24 hour care verse cognitive changes, therefore SLP believes pt will be well supported by [place] in his own environment. Pt was left in room with call bell within reach and chair alarm set. SLP recommends to continue skilled services.     Pain Pain Assessment Pain Score: 0-No pain  Therapy/Group: Individual Therapy  Cam Harnden  [facility] 05/26/2021, 12:44 PM

## 2021-05-26 NOTE — Progress Notes (Signed)
Occupational Therapy Discharge Summary  Patient Details  Name: [name] MRN: [id number] Date of Birth: December 01, 1930   Patient has met 9 of 10 long term goals due to improved activity tolerance, improved balance, postural control, ability to compensate for deficits, functional use of  RIGHT upper extremity, and improved coordination.  Patient to discharge at [facility] Supervision-Min Assist level.  Patient's care partner is independent to provide the necessary physical assistance at discharge and has demonstrated ability to assist pt and had good awareness during hands on training during family education with OT.   Reasons goals not met: functional sit > stand during ADL goal not met due to lower height of tub bench and w/c than pt is used to, pt has stood with as little as CGA, but did not meet at the supervision level due to decreased arousal, carry over between sessions and balance/strength  Recommendation:  Patient will benefit from ongoing skilled OT services in home health setting to continue to advance functional skills in the area of BADL and Reduce care partner burden.  Equipment: 3 in 1 BSC, TTB  Reasons for discharge: treatment goals met  Patient/family agrees with progress made and goals achieved: Yes  OT Discharge Precautions/Restrictions  Precautions Precautions: Fall Precaution Comments: poor cognition, can be hard of hearing at times Restrictions Weight Bearing Restrictions: No ADL ADL Eating: Set up Where Assessed-Eating: Chair Grooming: Modified independent Where Assessed-Grooming: Sitting at sink Upper Body Bathing: Setup Where Assessed-Upper Body Bathing: Shower Lower Body Bathing: Supervision/safety Where Assessed-Lower Body Bathing: Shower Upper Body Dressing: Setup Where Assessed-Upper Body Dressing: Edge of bed Lower Body Dressing: Supervision/safety Where Assessed-Lower Body Dressing: Edge of bed Toileting: Contact guard Where Assessed-Toileting:  Bedside Commode Toilet Transfer: Therapist, music Method: Ambulating (RW) Toilet Transfer Equipment: Radiographer, therapeutic: Not assessed Tub/Shower Transfer Method: Unable to assess Social research officer, government: Minimal assistance Social research officer, government Method: Management consultant: Radio broadcast assistant, Grab bars Vision Baseline Vision/History: 1 Wears glasses Patient Visual Report: No change from baseline Vision Assessment?: No apparent visual deficits Perception  Perception: Within Functional Limits Praxis Praxis: Impaired Praxis Impairment Details: Initiation;Motor planning Cognition Overall Cognitive Status: Impaired/Different from baseline Arousal/Alertness: Lethargic Orientation Level: Oriented to person;Oriented to place;Oriented to time;Disoriented to situation Year: Other (Comment) (2004) Month: January Day of Week: Correct Memory: Impaired Immediate Memory Recall: Sock;Blue;Bed Memory Recall Sock: With Cue Memory Recall Blue: Without Cue Memory Recall Bed: Without Cue Awareness: Impaired Problem Solving: Impaired Safety/Judgment: Impaired Sensation Sensation Light Touch: Appears Intact Coordination Gross Motor Movements are Fluid and Coordinated: No Fine Motor Movements are Fluid and Coordinated: No Coordination and Movement Description: Global deconditioning, lethargy Finger Nose Finger Test: Dysmetric on BUE, increased time Motor  Motor Motor: Abnormal postural alignment and control;Hemiplegia Motor - Skilled Clinical Observations: grossly uncoordinated due to fatigue, generalized weakness/deconditioning, and decreased balance/postural control. Previous CGA with R hemiparesis Mobility  Bed Mobility Bed Mobility: Rolling Right;Rolling Left;Sit to Supine;Supine to Sit Rolling Right: Supervision/verbal cueing Rolling Left: Supervision/Verbal cueing Supine to Sit: Supervision/Verbal cueing Sit to Supine: Supervision/Verbal  cueing Transfers Sit to Stand: Minimal Assistance - Patient > 75% Stand to Sit: Contact Guard/Touching assist  Trunk/Postural Assessment  Cervical Assessment Cervical Assessment: Exceptions to [facility] (forward head) Thoracic Assessment Thoracic Assessment: Exceptions to [facility] (kyphosis) Lumbar Assessment Lumbar Assessment: Exceptions to [facility] (posterior pelvic tilt) Postural Control Postural Control: Deficits on evaluation (slightly delayed)  Balance Balance Balance Assessed: Yes Static Sitting Balance Static Sitting - Balance Support: Feet  supported;Bilateral upper extremity supported Static Sitting - Level of Assistance: 6: Modified independent (Device/Increase time) Dynamic Sitting Balance Dynamic Sitting - Balance Support: Feet supported;No upper extremity supported Dynamic Sitting - Level of Assistance: 5: Stand by assistance (supervision) Static Standing Balance Static Standing - Balance Support: Bilateral upper extremity supported (RW) Static Standing - Level of Assistance: 5: Stand by assistance (close supervision) Dynamic Standing Balance Dynamic Standing - Balance Support: Bilateral upper extremity supported (RW) Dynamic Standing - Level of Assistance: 5: Stand by assistance (CGA) Dynamic Standing - Comments: with gait Extremity/Trunk Assessment RUE Assessment RUE Assessment: Exceptions to [facility] Active Range of Motion (AROM) Comments: limited <90 during self-care tasks General Strength Comments: RUE 4/5 except R hand- grip 3-/5 LUE Assessment LUE Assessment: Within Functional Limits General Strength Comments: WFL for self-care tasks   Jovanni Rash E Kyrianna Barletta 05/26/2021, 9:30 AM

## 2021-05-26 NOTE — Progress Notes (Signed)
Occupational Therapy Session Note  Patient Details  Name: [name] MRN: [id number] Date of Birth: 1931-05-06  Today's Date: 05/26/2021 OT Individual Time: 2244-9753 OT Individual Time Calculation (min): 76 min    Short Term Goals: Week 2:  OT Short Term Goal 1 (Week 2): STG = LTG due to ELOS  Skilled Therapeutic Interventions/Progress Updates:  Skilled OT intervention completed with focus on d/c planning, re-orientation, ADL retraining, activity tolerance. Pt received supine in bed, asleep, easily woken and agreeable to session. Pt initially really confused/disoriented but improved upon alert level increasing with pt oriented x3 this session. Pt encouraged to complete bed mobility with HOB flat due to home set up, with pt partially rolling > R with supervision then min A needed with hand held assist for bringing trunk to sitting EOB. Pt supervision for static/dynamic sitting balance. Sit > stand with min A with bed elevated using RW, then stand pivot with mod A as pt with heavy R lean and small posterior LOB when sitting. Pt expressing feeling of being "out of it" this session, with pt demonstrating decreased initiation of incorporating RUE during functional tasks, with occasional cues needed for safety with pt with RUE neglect (hand down at w/c wheel). Completed shaving while seated in w/c with supervision (min A to ensure accuracy). Pt completed UB bathing with supervision while seated in w/c at sink, and CGA needed for posterior pericare while in stance with RW with small smear in brief noted. Pt denying need to try for BM, with therapist doffing sacral dressing, with LPN in room to apply new dressing. Pt requiring min A to stand with RW, with pt able to tolerate about 3 min rounds of standing for LB pericare/dressing, however pt demonstrating increased fatigue with each stand and increased time needed for rest breaks. Completed LB dressing with mod A this session for time, as pt's daughter in  room with questions regarding upcoming d/c. Pt's daughter seeking info about potential caregiver coming in to assist pt, with OT educating on [place], as well as to contact CSW for recommendations for in home nursing care. Therapist educating pt's daughter on d/c recommendations, and discussed having her assist with sit <> stand transfers to feel assist level needed if cognition/alertness is lower. Pt left seated in w/c, with belt alarm on, daughter in room, and all needs in reach at end of session.   Therapy Documentation Precautions:  Precautions Precautions: Fall Restrictions Weight Bearing Restrictions: No  Pain: No c/o pain  Therapy/Group: Individual Therapy  Graig Hessling E Delsin Copen 05/26/2021, 7:33 AM

## 2021-05-26 NOTE — Progress Notes (Signed)
Patient ID: [name], male   DOB: 07-17-1930, 88 y.o.   MRN: [id number]  Patient accepted by [facility] Corporal

## 2021-05-26 NOTE — Progress Notes (Signed)
Patient ID: [name], male   DOB: 16-Feb-1931, 88 y.o.   MRN: [id number]  Sw spoke with patient daughter [name]). Goal for patient is to d/c to home.

## 2021-05-26 NOTE — Progress Notes (Signed)
Patient ID: [name], male   DOB: [birth year], 88 y.o.   MRN: [id number]  VM left for patient daughter about family edu.

## 2021-05-26 NOTE — Progress Notes (Signed)
Physical Therapy Session Note  Patient Details  Name: [name] MRN: [id number] Date of Birth: [birth year]  Today's Date: 05/26/2021 PT Individual Time: 1300-1408 PT Individual Time Calculation (min): 68 min   Short Term Goals: Week 1:  PT Short Term Goal 1 (Week 1): Pt will perform sit <>stand w/mod A and LRAD PT Short Term Goal 1 - Progress (Week 1): Met PT Short Term Goal 2 (Week 1): Pt will ambulate 25' w/LRAD and mod A PT Short Term Goal 2 - Progress (Week 1): Met PT Short Term Goal 3 (Week 1): Pt will perform bed <>chair transfers w/LRAD and mod A PT Short Term Goal 3 - Progress (Week 1): Met PT Short Term Goal 4 (Week 1): Pt will demonstrate sustained attention to task for 2 mintues w/S* and min verbal cues PT Short Term Goal 4 - Progress (Week 1): Met Week 2:  PT Short Term Goal 1 (Week 2): STG=LTG due to LOS  Skilled Therapeutic Interventions/Progress Updates:   Received pt sitting in WC asleep, upon wakening pt agreeable to PT treatment, and denied any pain at rest but later reported increased R knee pain with ambulation. Session with emphasis on functional mobility/transfers, generalized strengthening, dynamic standing balance/coordination, gait training, simulated car transfers, and improved endurance with activity. Pt transported to/from room in [facility] total A for time management purposes. Pt performed simulated car transfer with RW and mod A overall. Attempted from pt's daughter's low seat height, but pt unable to stand despite max A. Raised car seat height slightly and pt able to stand with mod A. Pt then ambulated 80ft on uneven surfaces (ramp) with RW and CGA. Pt reported having a pick up truck with a higher seat therefore recommended that pt's daughter drive pt's pickup truck to pick him up Wednesday. Pt reported his daughter will be here tomorrow afternoon - planning for family education session tomorrow, specifically to address car transfer. Pt transported to dayroom in [facility]  total A and performed seated BLE strengthening at 20 cm/sec for 1 minute x 4 trials with BUE support with emphasis on quad/glute strength. Pt performed sit<>stands with RW and min A throughout session and ambulated 24ft x 1, 24ft x 1, and 76ft x 1 with RW and CGA. Pt required cues to widen BOS as pt frequently stepping on his socks causing unsteadiness. Pt also limited by increased R knee pain. Pt requested to return to bed and transferred WC<>bed stand<>pivot with RW and min A and sit<>supine with supervision. Concluded session with pt supine in bed, needs within reach, and bed alarm on.   Therapy Documentation Precautions:  Precautions Precautions: Fall Restrictions Weight Bearing Restrictions: No  Therapy/Group: Individual Therapy Alfonse Alpers PT, DPT   05/26/2021, 7:21 AM

## 2021-05-26 NOTE — Discharge Summary (Signed)
Physician Discharge Summary  Patient ID: [name] MRN: [id number] DOB/AGE: [birth year] 86 y.o.  Admit date: 05/13/2021 Discharge date: 05/28/2021  Discharge Diagnoses:  Principal Problem:   Lacunar infarction [facility]) Active Problems:   CKD (chronic kidney disease) stage 3, GFR 30-59 ml/min (HCC)   Chronic anemia   DNR (do not resuscitate) DVT prophylaxis Hypertension History of CAD with CABG COVID-19 pneumonia Chronic combined congestive heart failure Diabetes mellitus Recent cholecystostomy tube placement  Discharged Condition: Stable  Significant Diagnostic Studies: CT ABDOMEN PELVIS WO CONTRAST  Result Date: 04/29/2021 CLINICAL DATA:  [place] last week, weakness, abdominal pain, cholecystostomy tube EXAM: CT ABDOMEN AND PELVIS WITHOUT CONTRAST TECHNIQUE: Multidetector CT imaging of the abdomen and pelvis was performed following the standard protocol without IV contrast. Unenhanced CT was performed per clinician order. Lack of IV contrast limits sensitivity and specificity, especially for evaluation of abdominal/pelvic solid viscera. COMPARISON:  03/10/2018 FINDINGS: Lower chest: There is a small multilocular left pleural effusion. Small free-flowing right pleural effusion is identified. Scattered areas of consolidation at the lung bases, most pronounced in the lower lobes, could reflect bronchopneumonia. Heart is enlarged without pericardial effusion. Hepatobiliary: A percutaneous cholecystostomy tube is identified, coiled within the gallbladder fundus. Gallbladder remains mildly distended, with gallbladder wall thickening and pericholecystic fluid consistent with cholecystitis. I do not see any calcified gallstones. There is increased soft tissue density within the subcutaneous portions of the cholecystostomy tube, likely inflammatory. No definite fluid collection on this limited unenhanced exam. There is no intrahepatic biliary duct dilation. Unenhanced imaging of the liver is  unremarkable. Pancreas: Unremarkable unenhanced appearance. Spleen: Unremarkable unenhanced appearance. Adrenals/Urinary Tract: Bilateral renal cortical atrophy. No urinary tract calculi or obstructive uropathy. The adrenals are unremarkable. Bladder is decompressed with a suprapubic catheter. Stomach/Bowel: No bowel obstruction or ileus. Diffuse colonic diverticulosis without diverticulitis. No bowel wall thickening or inflammatory change. Vascular/Lymphatic: Subcentimeter lymph nodes at the porta hepatis are likely reactive. No pathologic adenopathy. There is extensive atherosclerosis throughout the aorta and its branches unchanged. Reproductive: Stable enlargement the prostate. Other: Trace pelvic free fluid. No free intraperitoneal gas. No abdominal wall hernia. Musculoskeletal: No acute or destructive bony lesions. Reconstructed images demonstrate no additional findings. IMPRESSION: 1. Inflammatory changes of the gallbladder consistent with cholecystitis. Indwelling cholecystostomy tube as above. 2. Soft tissue density along the subcutaneous portion of the cholecystostomy tube, which may be inflammatory. No definite fluid collection is observed on this unenhanced exam. 3. Bibasilar airspace disease and bilateral pleural effusions, consistent with bronchopneumonia. Left pleural effusion is partially loculated. 4. Colonic diverticulosis without diverticulitis. 5. Stable indwelling suprapubic catheter, with decompression of the urinary bladder. 6. Enlarged prostate. 7.  Aortic Atherosclerosis (ICD10-I70.0). Electronically Signed   By: Randa Ngo M.D.   On: 04/29/2021 15:41   DG Chest 2 View  Result Date: 04/29/2021 CLINICAL DATA:  Cough EXAM: CHEST - 2 VIEW COMPARISON:  Previous studies including the examination of 04/16/2018 due to technical difficulties some of the images in the previous studies are not available for review. FINDINGS: Transverse diameter of heart is increased. Thoracic aorta is tortuous  and ectatic. There are linear densities in the lower lung fields suggesting scarring or subsegmental atelectasis. Part of this finding may be due to poor inspiration. There is no focal pulmonary consolidation. There are no signs of alveolar pulmonary edema. There is evidence of previous coronary bypass surgery. Patient's chin is partly obscuring the apices. IMPRESSION: Cardiomegaly. Central pulmonary vessels are prominent without signs of alveolar pulmonary edema. There are linear  densities in the lower lung fields suggesting scarring or subsegmental atelectasis. Electronically Signed   By: Elmer Picker M.D.   On: 04/29/2021 13:47   MR ANGIO HEAD WO CONTRAST  Result Date: 05/05/2021 CLINICAL DATA:  Provided history: Neuro deficit, acute, stroke suspected. Cough and generalized weakness/COVID. EXAM: MRI HEAD WITHOUT CONTRAST MRA HEAD WITHOUT CONTRAST TECHNIQUE: Multiplanar, multi-echo pulse sequences of the brain and surrounding structures were acquired without intravenous contrast. Angiographic images of the Circle of Willis were acquired using MRA technique without intravenous contrast. COMPARISON:  Noncontrast head CT 05/05/2021. Report from catheter based angiography 06/06/2018. Report from catheter based angiography 10/26/2018 brain MRI 05/24/2018. CT angiogram head/neck 05/24/2018. FINDINGS: MRI HEAD FINDINGS Brain: Intermittently motion degraded examination, limiting evaluation. Most notably, there is severe motion degradation of the sagittal T1 weighted sequence and moderate/severe motion degradation of the coronal T2 TSE sequence. Mild-to-moderate generalized cerebral atrophy. 3 mm focus of restricted diffusion within the posterior left frontal lobe white matter, compatible with acute infarction (series 5, image 42) (series 4, image 19). Redemonstrated chronic cortical/subcortical infarcts within the left frontal and parietal lobes. Additional known small chronic infarcts within the left  occipital lobe, posterior left temporal lobe and posterior right frontal lobe were better appreciated on the prior brain MRI of 05/24/2018 (acute at that time). Redemonstrated chronic lacunar infarcts within the bilateral cerebral hemispheric white matter. Redemonstrated circumscribed foci of T2 hyperintensity within the bilateral basal ganglia, likely reflecting a combination of prominent perivascular spaces and chronic lacunar infarcts. Background mild multifocal T2 FLAIR hyperintense signal abnormality within the cerebral white matter and pons, nonspecific but compatible with chronic small vessel ischemic disease. Partially empty sella turcica, a nonspecific finding. No evidence of an intracranial mass. No extra-axial fluid collection. No midline shift. Vascular: Reported below. Skull and upper cervical spine: Within the limitations of motion degradation, no focal suspicious marrow lesion is identified. Sinuses/Orbits: Visualized orbits show no acute finding. Bilateral lens replacements. Trace mucosal thickening within the bilateral frontal sinuses. Moderate mucosal thickening within the bilateral ethmoid sinuses. Trace mucosal thickening within the bilateral sphenoid sinuses. Mild mucosal thickening within the bilateral maxillary sinuses. Other: Trace fluid within the bilateral mastoid air cells. MRA HEAD FINDINGS The examination is moderately motion degraded, limiting evaluation. Anterior circulation: The intracranial internal carotid arteries are patent. There are sites of moderate stenosis within the cavernous/paraclinoid left ICA. The M1 middle cerebral arteries are patent. Redemonstrated known severe stenosis within the distal M1 segment of the left middle cerebral artery. Atherosclerotic irregularity of the M2 and more distal middle cerebral artery vessels, bilaterally. No M2 proximal branch occlusion is identified. The right anterior cerebral artery is patent. Unchanged from the prior catheter based  angiogram of 06/06/2018, the left anterior cerebral artery is occluded at the level of the proximal A2 segment. Within the limitations of motion degradation, no intracranial aneurysm is identified. 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 The intracranial left vertebral artery is patent with no more than mild stenosis. Mild atherosclerotic irregularity of the basilar artery (versus motion artifact). The posterior cerebral arteries are patent. Apparent severe stenosis within the distal P2 segment of the right PCA, not definitively present on prior examinations. Apparent atherosclerotic irregularity of the left PCA. Most notably, there is apparent progressive moderate/severe stenosis within the P1 left PCA. A right posterior communicating artery is present. The left posterior communicating artery is diminutive or absent. Anatomic variants: As described. IMPRESSION: MRI brain: 1. Motion degraded examination, as described and limiting evaluation.  2. 3 mm acute infarct within the posterior left frontal lobe white matter. 3. Known chronic cortical/subcortical infarcts within the left frontal and parietal lobes. Additional known small chronic infarcts within the left occipital lobe, posterior left temporal and posterior right frontal lobe were better appreciated on the prior brain MRI of 05/24/2018 (acute at that time). 4. Redemonstrated chronic lacunar infarcts within the bilateral cerebral hemispheric white matter and basal ganglia. 5. Background mild chronic small vessel ischemic changes within the cerebral white matter and pons, stable. 6. Mild-to-moderate generalized cerebral atrophy. 7. Paranasal sinus disease, as described. 8. Trace fluid within the bilateral mastoid air cells. MRA head: 1. Moderately motion degraded examination, limiting evaluation. 2. Intracranial atherosclerotic disease, most notably as follows. 3. 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 4. Redemonstrated sites of moderate stenosis within the cavernous/paraclinoid left ICA. 5. Redemonstrated severe stenosis within the distal M1 segment of the left MCA. 6. Apparent severe stenosis within the distal P2 right PCA, not definitely present on prior exams. 7. Progressive apparent moderate/severe stenosis within the P1 left PCA. Electronically Signed   By: Kellie Simmering D.O.   On: 05/05/2021 13:43   MR BRAIN WO CONTRAST  Result Date: 05/05/2021 CLINICAL DATA:  Provided history: Neuro deficit, acute, stroke suspected. Cough and generalized weakness/COVID. EXAM: MRI HEAD WITHOUT CONTRAST MRA HEAD WITHOUT CONTRAST TECHNIQUE: Multiplanar, multi-echo pulse sequences of the brain and surrounding structures were acquired without intravenous contrast. Angiographic images of the Circle of Willis were acquired using MRA technique without intravenous contrast. COMPARISON:  Noncontrast head CT 05/05/2021. Report from catheter based angiography 06/06/2018. Report from catheter based angiography 10/26/2018 brain MRI 05/24/2018. CT angiogram head/neck 05/24/2018. FINDINGS: MRI HEAD FINDINGS Brain: Intermittently motion degraded examination, limiting evaluation. Most notably, there is severe motion degradation of the sagittal T1 weighted sequence and moderate/severe motion degradation of the coronal T2 TSE sequence. Mild-to-moderate generalized cerebral atrophy. 3 mm focus of restricted diffusion within the posterior left frontal lobe white matter, compatible with acute infarction (series 5, image 42) (series 4, image 19). Redemonstrated chronic cortical/subcortical infarcts within the left frontal and parietal lobes. Additional known small chronic infarcts within the left occipital lobe, posterior left temporal lobe and posterior right frontal lobe were better appreciated on the prior brain MRI of 05/24/2018 (acute at that time).  Redemonstrated chronic lacunar infarcts within the bilateral cerebral hemispheric white matter. Redemonstrated circumscribed foci of T2 hyperintensity within the bilateral basal ganglia, likely reflecting a combination of prominent perivascular spaces and chronic lacunar infarcts. Background mild multifocal T2 FLAIR hyperintense signal abnormality within the cerebral white matter and pons, nonspecific but compatible with chronic small vessel ischemic disease. Partially empty sella turcica, a nonspecific finding. No evidence of an intracranial mass. No extra-axial fluid collection. No midline shift. Vascular: Reported below. Skull and upper cervical spine: Within the limitations of motion degradation, no focal suspicious marrow lesion is identified. Sinuses/Orbits: Visualized orbits show no acute finding. Bilateral lens replacements. Trace mucosal thickening within the bilateral frontal sinuses. Moderate mucosal thickening within the bilateral ethmoid sinuses. Trace mucosal thickening within the bilateral sphenoid sinuses. Mild mucosal thickening within the bilateral maxillary sinuses. Other: Trace fluid within the bilateral mastoid air cells. MRA HEAD FINDINGS The examination is moderately motion degraded, limiting evaluation. Anterior circulation: The intracranial internal carotid arteries are patent. There are sites of moderate stenosis within the cavernous/paraclinoid left ICA. The M1 middle cerebral arteries are patent. Redemonstrated  known severe stenosis within the distal M1 segment of the left middle cerebral artery. Atherosclerotic irregularity of the M2 and more distal middle cerebral artery vessels, bilaterally. No M2 proximal branch occlusion is identified. The right anterior cerebral artery is patent. Unchanged from the prior catheter based angiogram of 06/06/2018, the left anterior cerebral artery is occluded at the level of the proximal A2 segment. Within the limitations of motion degradation, no  intracranial aneurysm is identified. 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 The intracranial left vertebral artery is patent with no more than mild stenosis. Mild atherosclerotic irregularity of the basilar artery (versus motion artifact). The posterior cerebral arteries are patent. Apparent severe stenosis within the distal P2 segment of the right PCA, not definitively present on prior examinations. Apparent atherosclerotic irregularity of the left PCA. Most notably, there is apparent progressive moderate/severe stenosis within the P1 left PCA. A right posterior communicating artery is present. The left posterior communicating artery is diminutive or absent. Anatomic variants: As described. IMPRESSION: MRI brain: 1. Motion degraded examination, as described and limiting evaluation. 2. 3 mm acute infarct within the posterior left frontal lobe white matter. 3. Known chronic cortical/subcortical infarcts within the left frontal and parietal lobes. Additional known small chronic infarcts within the left occipital lobe, posterior left temporal and posterior right frontal lobe were better appreciated on the prior brain MRI of 05/24/2018 (acute at that time). 4. Redemonstrated chronic lacunar infarcts within the bilateral cerebral hemispheric white matter and basal ganglia. 5. Background mild chronic small vessel ischemic changes within the cerebral white matter and pons, stable. 6. Mild-to-moderate generalized cerebral atrophy. 7. Paranasal sinus disease, as described. 8. Trace fluid within the bilateral mastoid air cells. MRA head: 1. Moderately motion degraded examination, limiting evaluation. 2. Intracranial atherosclerotic disease, most notably as follows. 3. 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 4. Redemonstrated sites of moderate stenosis within the  cavernous/paraclinoid left ICA. 5. Redemonstrated severe stenosis within the distal M1 segment of the left MCA. 6. Apparent severe stenosis within the distal P2 right PCA, not definitely present on prior exams. 7. Progressive apparent moderate/severe stenosis within the P1 left PCA. Electronically Signed   By: Kellie Simmering D.O.   On: 05/05/2021 13:43   US RENAL  Result Date: 05/07/2021 CLINICAL DATA:  Increased creatinine. EXAM: RENAL / URINARY TRACT ULTRASOUND COMPLETE COMPARISON:  CT abdomen and pelvis 04/29/2021 FINDINGS: Right Kidney: Renal measurements: 9.5 x 4.7 x 5.3 cm = volume: 128 mL. Echogenicity is increased. No mass or hydronephrosis visualized. Left Kidney: Renal measurements: 11.4 x 4.9 x 4.5 cm = volume: 130 mL. Echogenicity is increased. No mass or hydronephrosis visualized. Bladder: Not well seen. Other: None. IMPRESSION: 1. Echogenic kidneys likely related to medical renal disease. 2. No hydronephrosis. 3. Bladder not well seen. Electronically Signed   By: Ronney Asters M.D.   On: 05/07/2021 20:46   DG Abd Portable 1V  Result Date: 05/05/2021 CLINICAL DATA:  Check biliary drainage catheter EXAM: PORTABLE ABDOMEN - 1 VIEW COMPARISON:  04/29/2021 FINDINGS: Scattered large and small bowel gas is noted. Drainage catheter is noted in the right upper quadrant which is shown to be a percutaneous cholecystostomy tube on recent CT although multiple proximal side holes are noted which are likely within the substance of the liver and extrinsic to the liver margin given its positioning. No free air is seen. No bony abnormality is  noted. IMPRESSION: No obstructive changes are seen. Catheter in the right upper quadrant which is shown to be a percutaneous cholecystostomy catheter on recent CT. It demonstrates multiple proximal side holes which are extrinsic to the liver margin. IR consultation and possible evaluation of the catheter on 05/06/2021 is recommended. Electronically Signed   By: Inez Catalina  M.D.   On: 05/05/2021 20:49   IR EXCHANGE BILIARY DRAIN  Result Date: 05/06/2021 INDICATION: History of acute cholecystitis, post ultrasound fluoroscopic guided cholecystostomy tube placement on 03/20/2021 at [facility] Reported pericatheter leaking and inadequate drainage. EXAM: FLUOROSCOPIC GUIDED CHOLECYSTOSTOMY TUBE EXCHANGE COMPARISON:  CT AP, 04/29/2021. MEDICATIONS: None ANESTHESIA/SEDATION: None CONTRAST:  31mL OMNIPAQUE IOHEXOL 300 MG/ML SOLN - administered into the gallbladder lumen. FLUOROSCOPY TIME:  1 minute 30 seconds.  6 mGy. COMPLICATIONS: None immediate. PROCEDURE: The patient was positioned supine on the fluoroscopy table. The external portion of the existing cholecystostomy tube as well as the surrounding skin was prepped and draped in usual sterile fashion. A time-out was performed prior to the initiation of the procedure. A preprocedural spot fluoroscopic image was obtained of the right upper abdominal quadrant existing cholecystostomy tube. The skin surrounding the cholecystostomy tube was anesthetized with 1% lidocaine with epinephrine. The external portion of the cholecystostomy tube was cut and cannulated with a short Amplatz wire which was advanced through the tube and coiled within the gallbladder lumen Next, under intermittent fluoroscopic guidance, the existing 8 Fr cholecystostomy tube was exchanged for a new, slightly larger now 10 Fr cholecystostomy tube which was repositioned into the more central aspect of the gallbladder lumen. Contrast injection confirms appropriate positioning and functionality of the cholecystomy tube. The cholecystostomy tube was flushed with a small amount of saline and reconnected to a gravity bag. The cholecystostomy tube was secured with an interrupted suture and a Stat Lock device. A dressing was applied. The patient tolerated the procedure well without immediate postprocedural complication. FINDINGS: Preprocedural spot fluoroscopic image  demonstrates unchanged positioning of cholecystostomy tube with end coiled and locked over the expected location of the fundus of the gallbladder After fluoroscopic guided exchange, the new, slightly larger, now 10 Fr cholecystostomy tube is more ideally positioned with end coiled and locked within the central aspect of the gallbladder lumen. Post exchange cholangiogram demonstrates appropriate positioning and functionality of the new cholecystostomy tube. IMPRESSION: Successful fluoroscopic guided exchange, repositioning and up sizing of now 10 Fr cholecystostomy tube. PLAN: - The patient's cholecystostomy tube was reconnected to a gravity bag. - The patient may return to the interventional radiology drain clinic in 6 weeks for repeat diagnostic cholangiogram. The patient knows to call the interventional radiology drain clinic [phone]) with any interval questions or concerns. Michaelle Birks, MD [facility] Electronically Signed   By: Michaelle Birks M.D.   On: 05/06/2021 17:28   CT HEAD CODE STROKE WO CONTRAST  Result Date: 05/05/2021 CLINICAL DATA:  Code stroke. Neuro deficit, acute, stroke suspected. Right-sided weakness. EXAM: CT HEAD WITHOUT CONTRAST TECHNIQUE: Contiguous axial images were obtained from the base of the skull through the vertex without intravenous contrast. COMPARISON:  05/24/2018 FINDINGS: Brain: No focal abnormality affects the brainstem or cerebellum. Both cerebral hemispheres show chronic small-vessel ischemic changes of the white matter. 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 No sign of acute infarction, mass lesion, hemorrhage, hydrocephalus or extra-axial collection. Vascular: There is atherosclerotic calcification of the major vessels at the base of the brain. Skull: Negative Sinuses/Orbits:  Clear/normal Other: None ASPECTS ([place] Stroke Program Early CT Score) - Ganglionic level infarction  (caudate, lentiform nuclei, internal capsule, insula, M1-M3 cortex): 7 - Supraganglionic infarction (M4-M6 cortex): 3, allowing for the old left parietal infarction. Total score (0-10 with 10 being normal): 10, allowing for the old left parietal infarction. IMPRESSION: 1. No acute CT finding. 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 2. ASPECTS is 10, allowing for the old left parietal infarction. 3. These results were communicated to Dr. Leonie Man at 10:03 am on 05/05/2021 by text page via the [facility] messaging system. Electronically Signed   By: Nelson Chimes M.D.   On: 05/05/2021 10:06   VAS US CAROTID  Result Date: 05/08/2021 Carotid Arterial Duplex Study Patient Name:  ALICK LECOMTE  Date of Exam:   05/06/2021 Medical Rec #: [id number]       Accession #:    [id number] Date of Birth: 10/20/1930       Patient Gender: M Patient Age:   47 years Exam Location:  [facility] Procedure:      VAS US CAROTID Referring Phys: PRAMOD SETHI --------------------------------------------------------------------------------  Indications:  TIA. Risk Factors: Hypertension, Diabetes. Limitations   Today's exam was limited due to patient movement. Performing Technologist: Archie Patten RVS  Examination Guidelines: A complete evaluation includes B-mode imaging, spectral Doppler, color Doppler, and power Doppler as needed of all accessible portions of each vessel. Bilateral testing is considered an integral part of a complete examination. Limited examinations for reoccurring indications may be performed as noted.  Right Carotid Findings: +----------+--------+--------+--------+------------------+--------+             PSV cm/s EDV cm/s Stenosis Plaque Description Comments  +----------+--------+--------+--------+------------------+--------+  CCA Prox   68       11                heterogenous                  +----------+--------+--------+--------+------------------+--------+  CCA Distal 57       15                heterogenous                 +----------+--------+--------+--------+------------------+--------+  ICA Prox   80       24       1-39%    heterogenous                 +----------+--------+--------+--------+------------------+--------+  ICA Distal 48       17                                             +----------+--------+--------+--------+------------------+--------+  ECA        72                                                      +----------+--------+--------+--------+------------------+--------+ +----------+--------+-------+--------+-------------------+             PSV cm/s EDV cms Describe Arm Pressure (mmHG)  +----------+--------+-------+--------+-------------------+  Subclavian 86                                             +----------+--------+-------+--------+-------------------+ +---------+--------+--+--------+--+---------+  Vertebral PSV cm/s 49 EDV cm/s 15 Antegrade  +---------+--------+--+--------+--+---------+  Left Carotid Findings: +----------+--------+--------+--------+------------------+--------+             PSV cm/s EDV cm/s Stenosis Plaque Description Comments  +----------+--------+--------+--------+------------------+--------+  CCA Prox   54       8                 heterogenous                 +----------+--------+--------+--------+------------------+--------+  CCA Distal 51       11                heterogenous                 +----------+--------+--------+--------+------------------+--------+  ICA Prox   47       16       1-39%    heterogenous                 +----------+--------+--------+--------+------------------+--------+  ICA Distal 35       16                                             +----------+--------+--------+--------+------------------+--------+  ECA        89       6                                               +----------+--------+--------+--------+------------------+--------+ +----------+--------+--------+--------+-------------------+             PSV cm/s EDV cm/s Describe Arm Pressure (mmHG)  +----------+--------+--------+--------+-------------------+  Subclavian 88                                              +----------+--------+--------+--------+-------------------+ +---------+--------+--+--------+--+---------+  Vertebral PSV cm/s 86 EDV cm/s 28 Antegrade  +---------+--------+--+--------+--+---------+   Summary: Right Carotid: Velocities in the right ICA are consistent with a 1-39% stenosis. Left Carotid: Velocities in the left ICA are consistent with a 1-39% stenosis. Vertebrals: Bilateral vertebral arteries demonstrate antegrade flow. *See table(s) above for measurements and observations.  Electronically signed by Antony Contras MD on 05/08/2021 at 12:42:41 PM.    Final    ECHOCARDIOGRAM LIMITED  Result Date: 05/05/2021    ECHOCARDIOGRAM LIMITED REPORT   Patient Name:   Keldrick LAMORRIS KNOBLOCK Date of Exam: 05/05/2021 Medical Rec #:  [id number]      Height:       73.0 in Accession #:    [id number]     Weight:       196.0 lb Date of Birth:  Mar 18, 1931      BSA:          2.133 m Patient Age:    53 years       BP:           131/71 mmHg Patient Gender: M              HR:           72 bpm. Exam Location:  Inpatient Procedure: 3D Echo, Limited Echo, Cardiac Doppler and Color Doppler Indications:    TIA  History:  Patient has prior history of Echocardiogram examinations, most                 recent 05/25/2018. CAD and Previous Myocardial Infarction,                 Abnormal ECG and Prior CABG, Stroke, Arrythmias:Bradycardia and                 Atrial Fibrillation; Risk Factors:Diabetes, Hypertension,                 Dyslipidemia and Sleep Apnea. Covid positive.  Sonographer:    Roseanna Rainbow RDCS Referring Phys: [place]  1. Left ventricular ejection fraction, by estimation, is 45 to 50%. The left  ventricle has mildly decreased function. The left ventricle demonstrates global hypokinesis. There is severe asymmetric left ventricular hypertrophy of the basal-septal segment.  2. Right ventricular systolic function is mildly reduced. The right ventricular size is normal. There is mildly elevated pulmonary artery systolic pressure. The estimated right ventricular systolic pressure is 78.5 mmHg.  3. The mitral valve is normal in structure. Trivial mitral valve regurgitation. No evidence of mitral stenosis.  4. The aortic valve was not well visualized. Aortic valve regurgitation is not visualized. No aortic stenosis is present.  5. The inferior vena cava is dilated in size with <50% respiratory variability, suggesting right atrial pressure of 15 mmHg.  6. Aortic dilatation noted. 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 FINDINGS  Left Ventricle: Left ventricular ejection fraction, by estimation, is 45 to 50%. The left ventricle has mildly decreased function. The left ventricle demonstrates global hypokinesis. The left ventricular internal cavity size was normal in size. There is  severe asymmetric left ventricular hypertrophy of the basal-septal segment. Right Ventricle: The right ventricular size is normal. Right ventricular systolic function is mildly reduced. There is mildly elevated pulmonary artery systolic pressure. The tricuspid regurgitant velocity is 2.62 m/s, and with an assumed right atrial pressure of 15 mmHg, the estimated right ventricular systolic pressure is 88.5 mmHg. Pericardium: There is no evidence of pericardial effusion. Mitral Valve: The mitral valve is normal in structure. Trivial mitral valve regurgitation. No evidence of mitral valve stenosis. Tricuspid Valve: The tricuspid valve is normal in structure. Tricuspid valve regurgitation is mild. Aortic Valve: The aortic valve was not well visualized. Aortic valve  regurgitation is not visualized. No aortic stenosis is present. Aorta: Aortic dilatation noted. There is mild dilatation of the aortic root, measuring 40 mm. There is an aneurysm involving the ascending aorta measuring 49 mm. Venous: The inferior vena cava is dilated in size with less than 50% respiratory variability, suggesting right atrial pressure of 15 mmHg. IAS/Shunts: The interatrial septum was not well visualized. LEFT VENTRICLE PLAX 2D LVIDd:         5.10 cm LVIDs:         4.00 cm LV PW:         1.30 cm LV IVS:        1.70 cm LVOT diam:     2.30 cm      3D Volume EF: LVOT Area:     4.15 cm     3D EF:        62 %                             LV EDV:  168 ml                             LV ESV:       65 ml LV Volumes (MOD)            LV SV:        104 ml LV vol d, MOD A2C: 163.0 ml LV vol d, MOD A4C: 89.9 ml LV vol s, MOD A2C: 79.8 ml LV vol s, MOD A4C: 39.4 ml LV SV MOD A2C:     83.2 ml LV SV MOD A4C:     89.9 ml LV SV MOD BP:      64.5 ml IVC IVC diam: 2.40 cm LEFT ATRIUM         Index LA diam:    3.40 cm 1.59 cm/m   AORTA Ao Root diam: 4.00 cm Ao Asc diam:  4.90 cm TRICUSPID VALVE TR Peak grad:   27.5 mmHg TR Vmax:        262.00 cm/s  SHUNTS Systemic Diam: 2.30 cm Oswaldo Milian MD Electronically signed by Oswaldo Milian MD Signature Date/Time: 05/05/2021/3:52:38 PM    Final     Labs:  Basic Metabolic Panel: Recent Labs  Lab 05/23/21 0617  NA 138  K 5.0  CL 108  CO2 23  GLUCOSE 85  BUN 34*  CREATININE 2.02*  CALCIUM 8.6*    CBC: Recent Labs  Lab 05/23/21 0617  WBC 2.8*  HGB 6.3*  HCT 19.7*  MCV 99.0  PLT 230    CBG: Recent Labs  Lab 05/26/21 2108 05/27/21 0611 05/27/21 1117 05/27/21 1624 05/27/21 2118  GLUCAP 213* 112* 145* 180* 189*   Family history.  Father with heart problems.  Denies any colon cancer esophageal cancer or rectal cancer  Brief HPI:   POSEY PETRIK is a 88 y.o. right-handed male with history of AAA, chronic anemia CAD with CABG  chronic combined CHF diabetes mellitus hypertension hyperlipidemia recent cholecystostomy tube placement.  Last hospitalized [facility] from 03/15/21-03/21/2021 for acute cholecystitis.  He did receive IV iron infusion 10/29 with increase hemoglobin 6.5-7.2.  Patient not a surgical candidate due to generalized condition as well as anemia refusal of blood products patient Jehovah witness.  He was treated with Zosyn percutaneous drain was placed.  Presented 04/29/2021 with increasing cough fatigue and generalized weakness.  CT of the abdomen pelvis showed inflammatory changes in gallbladder consistent with cholecystitis.  Indwelling cholecystostomy tube noted.  Colonic diverticulosis without diverticulitis.  Admission chemistries unremarkable except sodium 129 glucose 229 hemoglobin 5.8 urine culture greater than 100,000 Pseudomonas, COVID-19.  Placed on broad-spectrum antibiotics changed to [place].  He was started on low-dose steroids as well as remdesivir completing course.  He completed course of isolation 12/23.  On 05/05/2021 with increasing confusion.  MRI of the brain showed 3 mm acute infarct within the posterior left frontal lobe white matter.  Known chronic cortical subcortical infarcts within the left frontal and parietal lobes.  Additional known small chronic infarct within the left occipital lobe posterior left temporal and posterior right frontal lobe.  MRA showed no evidence of intracranial mass.  No midline shift.  Echocardiogram ejection fraction of 45 to 50%.  Left ventricle demonstrated some global hypokinesis.  He continued a chronic indwelling suprapubic catheter change 12/21.  Maintained on Plavix and aspirin x3 weeks then Plavix alone.  Tolerating a regular diet.  Therapy evaluations completed due to patient decreased functional mobility  was admitted for a comprehensive rehab program.   Hospital Course: DAQUANN MERRIOTT was admitted to rehab 05/13/2021 for inpatient therapies to consist of PT, ST and  OT at least three hours five days a week. Past admission physiatrist, therapy team and rehab RN have worked together to provide customized collaborative inpatient rehab.  Pertaining to patient's infarct posterior left frontal lobe white matter remained stable follow-up neurology services.  Aspirin and Plavix x3 weeks then Plavix alone.  Subcutaneous heparin for DVT prophylaxis.  Blood pressure controlled and monitored bouts of bradycardia asymptomatic he would need outpatient follow-up.  History of CAD with CABG.  Continue Imdur as advised.  COVID-19 pneumonia treated with remdesivir and steroid isolation discontinued 12/23.  Symptomatic anemia declined blood products due to Jehovah's Witness.  He would need outpatient monitoring.  Blood sugars controlled hemoglobin A1c 8.5 and presently continued on Glucophage.  Hospital course she did complete a course of Fortaz for Pseudomonas UTI.  CKD stage III with latest creatinine 2.02.   Blood pressures were monitored on TID basis and soft and monitored  Diabetes has been monitored with ac/hs CBG checks and SSI was use prn for tighter BS control.    Rehab course: During patient's stay in rehab weekly team conferences were held to monitor patient's progress, set goals and discuss barriers to discharge. At admission, patient required minimal assist 60 feet rolling walker mod max assist sit to stand  Physical exam.  Blood pressure 161/75 pulse 71 temperature 97.5 respirations 17 oxygen saturations 94% room air Constitutional.  No acute distress.  Hard of hearing HEENT Head.  Normocephalic and atraumatic Eyes.  Pupils round and reactive to light no discharge.nystagmus Neck.  Supple nontender no JVD without thyromegaly Cardiac regular rate rhythm no extra sounds or murmur heard Abdomen.  Soft nontender positive bowel sounds without rebound Respiratory effort normal no respiratory distress without wheeze Genitourinary.  SPC in place on lower  abdomen Musculoskeletal.  Normal range of motion no rigidity Right upper extremity 4/5 except right hand grip 3 -/5 and FA 2 -/5 as history of chronic right hand damage held open. Right lower extremity 4+/5 in hip flexors knee extension knee flexion dorsi plantarflexion Neurologic.  Alert makes eye contact follows commands Limited medical historian.  He/She  has had improvement in activity tolerance, balance, postural control as well as ability to compensate for deficits. He/She has had improvement in functional use RUE/LUE  and RLE/LLE as well as improvement in awareness.  Perform bed mobility with minimal assist and use of bed features.  Patient able to manage bilateral lower extremities independently but requiring assist for truncal support.  Ambulates 35 feet with assistive device.  He did need rest breaks with endurance training.  Supine to sit edge of bed with supervision.  He did fatigue easily with ADLs.  Patient did need assist from family to complete ADLs.  Patient was able to summarize in retail very basic general level information but without any specifics.  It was stressed to family need for assist on discharge.  Full family teaching completed and discharged home       Disposition: Discharged home    Diet: Regular  Special Instructions: No driving smoking or alcohol  Medications at discharge 1.  Tylenol as needed 2.  Albuterol inhaler 2 puffs every 2 hours as needed 3.  Norvasc 10 mg p.o. daily 4.  Vitamin C 500 mg p.o. daily 5.  Coreg 6.25 mg p.o. twice daily 6.  Cardura 4 mg p.o.  daily 7.  Zetia 10 mg p.o. daily 8.  Ferrous sulfate 325 mg p.o. daily 9.  Flonase 2 sprays each nostril daily 10.  Folic acid 1 mg p.o. daily 11.  Hydralazine 100 mg twice daily and 75 mg daily 12.  Protonix 40 mg p.o. daily 13.  MiraLAX daily 14.  Imdur 60 mg p.o. twice daily 15.  Florastor 250 mg p.o. twice daily 16.  Sodium bicarbonate 650 mg p.o. twice daily 17.  Vitamin B12 1000 mcg  p.o. daily 18.  Zinc sulfate 220 mg p.o. daily 19.  Plavix 75 mg p.o. daily 20.  Glucophage 500 mg p.o. twice daily   30-35 minutes were spent completing discharge summary and discharge planning Discharge Instructions     Ambulatory referral to Neurology   Complete by: As directed    An appointment is requested in approximately: 4 weeks lacunar infarction   Ambulatory referral to Physical Medicine Rehab   Complete by: As directed    Moderate complexity follow-up 1 to 2 weeks lacunar infarction        Follow-up Information     Raulkar, Clide Deutscher, MD Follow up.   Specialty: Physical Medicine and Rehabilitation Why: 06/30/21 Please arrive at 10:30am for 11am appointment, thank you! Contact information: [address] [place] [address] [phone]                 Signed: Cathlyn Parsons 05/28/2021, 5:46 AM

## 2021-05-26 NOTE — Progress Notes (Signed)
PROGRESS NOTE   Subjective/Complaints: No new complaints this morning.  Sleepy Plan to d/c home Appreciate SW updating daughter  ROS:  Pt denies SOB, abd pain, CP, N/V/C/D, and vision changes, pain    Objective:   No results found. No results for input(s): WBC, HGB, HCT, PLT in the last 72 hours.   No results for input(s): NA, K, CL, CO2, GLUCOSE, BUN, CREATININE, CALCIUM in the last 72 hours.    Intake/Output Summary (Last 24 hours) at 05/26/2021 1231 Last data filed at 05/26/2021 0449 Gross per 24 hour  Intake 230 ml  Output 1150 ml  Net -920 ml     Pressure Injury 05/05/21 Buttocks Right Stage 1 -  Intact skin with non-blanchable redness of a localized area usually over a bony prominence. small open area on right buttocks (Active)  05/05/21 1700  Location: Buttocks  Location Orientation: Right  Staging: Stage 1 -  Intact skin with non-blanchable redness of a localized area usually over a bony prominence.  Wound Description (Comments): small open area on right buttocks  Present on Admission:     Physical Exam: Vital Signs Blood pressure 135/75, pulse 71, temperature 98 F (36.7 C), resp. rate 20, SpO2 91 %. Gen: no distress, normal appearing HEENT: oral mucosa pink and moist, NCAT Cardio: Reg rate Chest: normal effort, normal rate of breathing Abd: soft, non-distended Ext: no edema Psych: pleasant, normal affect Musculoskeletal:     Cervical back: Normal range of motion. No rigidity.     Comments: LUE/LLE 5-/5 RUE 4/5 except R hand- grip 3-/5 and FA 2-/5- has hx of chronic R hand damage- held open - not using- except as secondary support when eating RLE_ 4+/5 in HF/KE/KF/DF and PF  Skin:    General: Skin is warm and dry.     Comments: SPC and Perc drain as above IV's in L AC fossa and R wrist- look OK  Neurological:     Comments: Patient is alert.  Makes eye contact with examiner.  Provides name and  age.  Follows commands.  Limited but fair medical historian. HOH- but making some jokes- Ox3 upon questioning Sensation to light touch intact x4 except R hand  Psychiatric:        Mood and Affect: Mood normal.        Behavior: Behavior normal.    Assessment/Plan: 1. Functional deficits which require 3+ hours per day of interdisciplinary therapy in a comprehensive inpatient rehab setting. Physiatrist is providing close team supervision and 24 hour management of active medical problems listed below. Physiatrist and rehab team continue to assess barriers to discharge/monitor patient progress toward functional and medical goals  Care Tool:  Bathing    Body parts bathed by patient: Right arm, Left arm, Chest, Abdomen, Front perineal area, Buttocks, Right upper leg, Left upper leg, Right lower leg, Left lower leg, Face   Body parts bathed by helper: Front perineal area, Buttocks, Right lower leg, Left lower leg     Bathing assist Assist Level: Supervision/Verbal cueing     Upper Body Dressing/Undressing Upper body dressing   What is the patient wearing?: Pull over shirt    Upper body assist Assist  Level: Set up assist    Lower Body Dressing/Undressing Lower body dressing      What is the patient wearing?: Pants, Incontinence brief     Lower body assist Assist for lower body dressing: Moderate Assistance - Patient 50 - 74%     Toileting Toileting    Toileting assist Assist for toileting: Contact Guard/Touching assist     Transfers Chair/bed transfer  Transfers assist     Chair/bed transfer assist level: Minimal Assistance - Patient > 75%     Locomotion Ambulation   Ambulation assist   Ambulation activity did not occur: Safety/medical concerns (Severe lethargy, global deconditioning)  Assist level: Contact Guard/Touching assist Assistive device: Walker-rolling Max distance: 170ft   Walk 10 feet activity   Assist  Walk 10 feet activity did not occur:  Safety/medical concerns (Severe lethargy, global deconditioning)  Assist level: Contact Guard/Touching assist Assistive device: Walker-rolling   Walk 50 feet activity   Assist Walk 50 feet with 2 turns activity did not occur: Safety/medical concerns (Severe lethargy, global deconditioning)  Assist level: Contact Guard/Touching assist Assistive device: Walker-rolling    Walk 150 feet activity   Assist Walk 150 feet activity did not occur: Safety/medical concerns (Severe lethargy, global deconditioning)         Walk 10 feet on uneven surface  activity   Assist Walk 10 feet on uneven surfaces activity did not occur: Safety/medical concerns (Severe lethargy, global deconditioning)   Assist level: Contact Guard/Touching assist Assistive device: Walker-rolling   Wheelchair     Assist Is the patient using a wheelchair?: Yes Type of Wheelchair: Manual    Wheelchair assist level: Dependent - Patient 0%      Wheelchair 50 feet with 2 turns activity    Assist        Assist Level: Dependent - Patient 0%   Wheelchair 150 feet activity     Assist      Assist Level: Dependent - Patient 0%   Blood pressure 135/75, pulse 71, temperature 98 F (36.7 C), resp. rate 20, SpO2 91 %.  Medical Problem List and Plan: 1. Functional deficits with right side weakness and confusion secondary to infarct posterior left frontal lobe white matter             -patient may  shower             -ELOS/Goals: 7-10 days mod I  Consulted palliative care to establish goals of care with family, appreciate them reaching out to daughters and patient  Continue CIR- PT, OT, SLP  HFU scheduled 2/13  D/c home in 2 days 2.  Impaired mobility: Continue Subcutaneous heparin q8H.             -antiplatelet therapy: Aspirin 81 mg daily and Plavix 75 mg day x3 weeks then Plavix alone 3. Pain: N/A 4. Mood: Provide emotional support             -antipsychotic agents: N/A 5. Neuropsych: This  patient is capable of making decisions on his own behalf. 6. Skin/Wound Care: Routine skin checks 7. Fluids/Electrolytes/Nutrition: Routine in and outs with follow-up chemistries 8.  Hypertension.  SBP elevated and DBP normal continue Coreg to 6.25mg  BID given bradycardia below. Increase Hydralazine to 85mg  TID.  9.  History of CAD with CABG.  continue Imdur 60 mg twice daily 10.  COVID-19 pneumonia.  Treated with remdesivir and steroid.  Isolation discontinued 12/23 11.  Symptomatic anemia.  Declines blood product due to Jehovah witness..  Continue Aranesp,  Hgb 5.9, up to 6.3, discussed with patient, continue to monitor as needed. Iron tablet started 12.  Diabetes mellitus.  Hemoglobin A1c 8.5.  SSI.  Patient on Glucophage 500 mg twice daily as well as Amaryl 4 mg daily prior to admission.  Resume as needed  CBG 90-144 d/c ISS. D.c amaryl and increase metformin to 500mg . Cr at baseline 13.  Percutaneous cholecystostomy drain malfunction.  Patient hospitalized [facility] in October for cholecystitis.  Continue Cardura.  Not a surgical candidate status post percutaneous cholecystostomy drain. 14.  Hyperlipidemia.  Zetia 15.  Pseudomonas UTI.  Completing course of IV Fortaz. 16. Constipation- LBM 1 week ago- will give sorbitol and monitor.   1/2- LBM 2 days ago- denies constipaiton- if no BM by tomorrow, suggest Sorbitol 17. Bradycardia: decrease Coreg to 6.25mg  BID 18. Stage 3 CKD: baseline, monitor Mondays.  19. Impaired cognition: continue SLP 20. Impaired problem solving: continue SLP.     LOS: 13 days A FACE TO FACE EVALUATION WAS PERFORMED  [name] [name] 05/26/2021, 12:31 PM

## 2021-05-27 ENCOUNTER — Other Ambulatory Visit (HOSPITAL_COMMUNITY): Payer: Self-pay

## 2021-05-27 LAB — GLUCOSE, CAPILLARY
Glucose-Capillary: 112 mg/dL — ABNORMAL HIGH (ref 70–99)
Glucose-Capillary: 145 mg/dL — ABNORMAL HIGH (ref 70–99)
Glucose-Capillary: 180 mg/dL — ABNORMAL HIGH (ref 70–99)
Glucose-Capillary: 189 mg/dL — ABNORMAL HIGH (ref 70–99)

## 2021-05-27 MED ORDER — ASCORBIC ACID 500 MG PO TABS
500.0000 mg | ORAL_TABLET | Freq: Every day | ORAL | 0 refills | Status: AC
  Filled 2021-05-27: qty 30, 30d supply, fill #0

## 2021-05-27 MED ORDER — DOXAZOSIN MESYLATE 4 MG PO TABS
4.0000 mg | ORAL_TABLET | Freq: Every day | ORAL | 0 refills | Status: AC
  Filled 2021-05-27: qty 30, 30d supply, fill #0

## 2021-05-27 MED ORDER — FERROUS SULFATE 325 (65 FE) MG PO TABS
325.0000 mg | ORAL_TABLET | Freq: Every day | ORAL | 0 refills | Status: AC
Start: 2021-05-27 — End: ?
  Filled 2021-05-27: qty 30, 30d supply, fill #0

## 2021-05-27 MED ORDER — HYDRALAZINE HCL 50 MG PO TABS
100.0000 mg | ORAL_TABLET | Freq: Two times a day (BID) | ORAL | Status: DC
  Administered 2021-05-27 – 2021-05-28 (×2): 100 mg via ORAL
  Filled 2021-05-27 (×2): qty 2

## 2021-05-27 MED ORDER — SACCHAROMYCES BOULARDII 250 MG PO CAPS
250.0000 mg | ORAL_CAPSULE | Freq: Two times a day (BID) | ORAL | 0 refills | Status: AC
Start: 2021-05-27 — End: ?
  Filled 2021-05-27: qty 20, 10d supply, fill #0

## 2021-05-27 MED ORDER — HYDRALAZINE HCL 25 MG PO TABS
75.0000 mg | ORAL_TABLET | Freq: Every day | ORAL | 0 refills | Status: DC
Start: 2021-05-27 — End: 2021-06-27
  Filled 2021-05-27: qty 90, 30d supply, fill #0

## 2021-05-27 MED ORDER — CYANOCOBALAMIN 1000 MCG PO TABS
1000.0000 ug | ORAL_TABLET | Freq: Every day | ORAL | 0 refills | Status: AC
Start: 2021-05-27 — End: ?
  Filled 2021-05-27: qty 30, 30d supply, fill #0

## 2021-05-27 MED ORDER — GUAIFENESIN ER 600 MG PO TB12
600.0000 mg | ORAL_TABLET | Freq: Two times a day (BID) | ORAL | 0 refills | Status: AC
Start: 2021-05-27 — End: ?
  Filled 2021-05-27: qty 60, 30d supply, fill #0

## 2021-05-27 MED ORDER — METFORMIN HCL 500 MG PO TABS
500.0000 mg | ORAL_TABLET | Freq: Two times a day (BID) | ORAL | 0 refills | Status: DC
  Filled 2021-05-27: qty 60, 30d supply, fill #0

## 2021-05-27 MED ORDER — CARVEDILOL 6.25 MG PO TABS
6.2500 mg | ORAL_TABLET | Freq: Two times a day (BID) | ORAL | 0 refills | Status: DC
  Filled 2021-05-27: qty 60, 30d supply, fill #0

## 2021-05-27 MED ORDER — HYDRALAZINE HCL 50 MG PO TABS
75.0000 mg | ORAL_TABLET | Freq: Every day | ORAL | Status: DC
  Administered 2021-05-27: 75 mg via ORAL
  Filled 2021-05-27: qty 1

## 2021-05-27 MED ORDER — EZETIMIBE 10 MG PO TABS
10.0000 mg | ORAL_TABLET | Freq: Every day | ORAL | 0 refills | Status: DC
  Filled 2021-05-27 (×2): qty 30, 30d supply, fill #0

## 2021-05-27 MED ORDER — POLYETHYLENE GLYCOL 3350 17 G PO PACK: 17.0000 g | PACK | Freq: Every day | ORAL | 0 refills | Status: AC

## 2021-05-27 MED ORDER — ASPIRIN 81 MG PO CHEW
81.0000 mg | CHEWABLE_TABLET | Freq: Every day | ORAL | 0 refills | Status: DC
  Filled 2021-05-27: qty 2, 2d supply, fill #0

## 2021-05-27 MED ORDER — PANTOPRAZOLE SODIUM 40 MG PO TBEC
40.0000 mg | DELAYED_RELEASE_TABLET | Freq: Every day | ORAL | 0 refills | Status: AC
Start: 2021-05-27 — End: ?
  Filled 2021-05-27: qty 30, 30d supply, fill #0

## 2021-05-27 MED ORDER — SODIUM BICARBONATE 650 MG PO TABS
650.0000 mg | ORAL_TABLET | Freq: Two times a day (BID) | ORAL | 0 refills | Status: AC
Start: 2021-05-27 — End: ?
  Filled 2021-05-27: qty 60, 30d supply, fill #0

## 2021-05-27 MED ORDER — HYDRALAZINE HCL 100 MG PO TABS
100.0000 mg | ORAL_TABLET | Freq: Two times a day (BID) | ORAL | 0 refills | Status: DC
Start: 2021-05-27 — End: 2021-07-21
  Filled 2021-05-27: qty 60, 30d supply, fill #0

## 2021-05-27 MED ORDER — ZINC SULFATE 220 (50 ZN) MG PO TABS
220.0000 mg | ORAL_TABLET | Freq: Every day | ORAL | 0 refills | Status: AC
Start: 2021-05-27 — End: ?
  Filled 2021-05-27: qty 30, 30d supply, fill #0

## 2021-05-27 MED ORDER — ALBUTEROL SULFATE HFA 108 (90 BASE) MCG/ACT IN AERS
2.0000 | INHALATION_SPRAY | RESPIRATORY_TRACT | 0 refills | Status: AC | PRN
Start: 2021-05-27 — End: ?
  Filled 2021-05-27: qty 8.5, 15d supply, fill #0

## 2021-05-27 MED ORDER — FOLIC ACID 1 MG PO TABS
1.0000 mg | ORAL_TABLET | Freq: Every day | ORAL | 0 refills | Status: AC
  Filled 2021-05-27: qty 30, 30d supply, fill #0

## 2021-05-27 MED ORDER — ISOSORBIDE MONONITRATE ER 60 MG PO TB24
60.0000 mg | ORAL_TABLET | Freq: Two times a day (BID) | ORAL | 0 refills | Status: DC
  Filled 2021-05-27: qty 60, 30d supply, fill #0

## 2021-05-27 MED ORDER — CLOPIDOGREL BISULFATE 75 MG PO TABS
75.0000 mg | ORAL_TABLET | Freq: Every day | ORAL | 0 refills | Status: AC
Start: 2021-05-27 — End: ?
  Filled 2021-05-27 (×2): qty 30, 30d supply, fill #0

## 2021-05-27 MED ORDER — AMLODIPINE BESYLATE 10 MG PO TABS
10.0000 mg | ORAL_TABLET | Freq: Every day | ORAL | 0 refills | Status: DC
  Filled 2021-05-27: qty 30, 30d supply, fill #0

## 2021-05-27 NOTE — Progress Notes (Signed)
Physical Therapy Discharge Summary  Patient Details  Name: [name] MRN: [id number] Date of Birth: 10-Feb-1931  Today's Date: 05/27/2021 PT Individual Time: 0801-0854 PT Individual Time Calculation (min): 53 min   Patient has met 7 of 9 long term goals due to improved activity tolerance, improved balance, improved postural control, increased strength, improved awareness, and improved coordination.  Patient to discharge at an ambulatory level  CGA with RW . Patient's care partner is independent to provide the necessary physical and cognitive assistance at discharge.   Reasons goals not met: Pt did not meet cognitive recall goal of supervision as pt continues to require assist to recall daily events. Pt did not meet WC mobility goal of 157ft as pt is only able to propel 32ft prior to stopping due to fatigue. Pt continues to be limited by generalized weakness/deconditioning and cognitive impairments.   Recommendation:  Patient will benefit from ongoing skilled PT services in home health setting to continue to advance safe functional mobility, address ongoing impairments in transfers, generalized strengthening, dynamic standing balance/coordination, gait training, NMR, endurance, and to minimize fall risk.  Equipment: No equipment provided - pt already has RW  Reasons for discharge: treatment goals met  Patient/family agrees with progress made and goals achieved: Yes  Today's Interventions:  Received pt supine in bed asleep. Upon wakening pt agreeable to PT treatment and denied any pain during session. Session with emphasis on discharge planning, functional mobility/transfers, generalized strengthening, dynamic standing balance/coordination, gait training, and improved endurance with activity. Pt transferred supine<>sitting EOB with HOB elevated and use of bedrails with min A (but is able to do with as little as supervision). Pt transferred bed<>WC stand<>pivot with RW and heavy min A to  stand from bed. Pt performed WC mobility 80ft using BUE and supervision but stopped due to fatigue and weakness in RUE. Pt transported remainder of way to/from ortho gym in [facility] dependently. Pt performed the following seated LE exercises with emphasis on strength/ROM while taking active rest break: -hip flexion 2x12 bilaterally -LAQ 2x12 bilaterally -hip adduction ball squeezes 2x15 Sit<>stand with RW and min A and performed ambulatory simulated car transfer from pt's pickup truck height with RW and min A - increased time to get LEs into car, but did not require any assist from therapist. Re-enforced suggestion to have pt's daughter bring pickup truck to pick up pt from hospital tomorrow due to higher seat and pt verbalized understanding. Discussed alternative methods for getting in/out of car if pt's daughter brings her car (which sits low). Returned to room and pt requested to return to bed prior to OT session. WC<>bed stand<>pivot with RW and min A and sit<>supine with supervision. Concluded session with pt supine in bed, needs within reach, and bed alarm on. RN present at bedside attending to care.   PT Discharge Precautions/Restrictions Precautions Precautions: Fall Precaution Comments: cognitive deficits, can be hard of hearing at times Restrictions Weight Bearing Restrictions: No Pain Interference Pain Interference Pain Effect on Sleep: 0. Does not apply - I have not had any pain or hurting in the past 5 days Pain Interference with Therapy Activities: 0. Does not apply - I have not received rehabilitationtherapy in the past 5 days Pain Interference with Day-to-Day Activities: 1. Rarely or not at all Cognition Overall Cognitive Status: Impaired/Different from baseline Arousal/Alertness: Lethargic Orientation Level: Oriented to person;Oriented to place;Oriented to time Memory: Impaired Awareness: Impaired Problem Solving: Impaired Safety/Judgment: Impaired Sensation Sensation Light  Touch: Appears Intact Proprioception: Appears Intact Coordination  Gross Motor Movements are Fluid and Coordinated: No Fine Motor Movements are Fluid and Coordinated: No Coordination and Movement Description: grossly uncoordinated due to fatigue, generalized weakness/deconditioning, and decreased balance/postural control. Finger Nose Finger Test: dysmetria bilaterally RUE>LUE Heel Shin Test: decreased ROM and speed bilaterally Motor  Motor Motor: Abnormal postural alignment and control;Hemiplegia Motor - Skilled Clinical Observations: grossly uncoordinated due to fatigue, generalized weakness/deconditioning, and decreased balance/postural control. Previous CGA with R hemiparesis  Mobility Bed Mobility Bed Mobility: Rolling Right;Rolling Left;Sit to Supine;Supine to Sit Rolling Right: Supervision/verbal cueing Rolling Left: Supervision/Verbal cueing Supine to Sit: Supervision/Verbal cueing Sit to Supine: Supervision/Verbal cueing Transfers Transfers: Sit to Stand;Stand to Sit;Stand Pivot Transfers Sit to Stand: Minimal Assistance - Patient > 75% Stand to Sit: Contact Guard/Touching assist Stand Pivot Transfers: Minimal Assistance - Patient > 75% Stand Pivot Transfer Details: Verbal cues for technique Stand Pivot Transfer Details (indicate cue type and reason): verbal cues to scoot to edge of chair and for anterior weight shifting Transfer (Assistive device): Rolling walker Locomotion  Gait Ambulation: Yes Gait Assistance: Contact Guard/Touching assist Gait Distance (Feet): 127 Feet Assistive device: Rolling walker Gait Gait: Yes Gait Pattern: Impaired Gait Pattern: Step-to pattern;Decreased step length - right;Decreased step length - left;Narrow base of support;Decreased stride length;Poor foot clearance - left;Poor foot clearance - right;Trunk flexed Gait velocity: decreased Stairs / Additional Locomotion Stairs: No Ramp: Contact Guard/touching assist (RW) Pick up small object  from the floor assist level: Dependent - Patient 0% Wheelchair Mobility Wheelchair Mobility: Yes Wheelchair Assistance: Chartered loss adjuster: Both upper extremities Wheelchair Parts Management: Needs assistance Distance: 70ft  Trunk/Postural Assessment  Cervical Assessment Cervical Assessment: Exceptions to [facility] (forward head) Thoracic Assessment Thoracic Assessment: Exceptions to [facility] (kyphosis) Lumbar Assessment Lumbar Assessment: Exceptions to [facility] (posterior pelvic tilt) Postural Control Postural Control: Deficits on evaluation (slightly delayed)  Balance Balance Balance Assessed: Yes Static Sitting Balance Static Sitting - Balance Support: Feet supported;Bilateral upper extremity supported Static Sitting - Level of Assistance: 6: Modified independent (Device/Increase time) Dynamic Sitting Balance Dynamic Sitting - Balance Support: Feet supported;No upper extremity supported Dynamic Sitting - Level of Assistance: 5: Stand by assistance (supervision) Static Standing Balance Static Standing - Balance Support: Bilateral upper extremity supported (RW) Static Standing - Level of Assistance: 5: Stand by assistance (close supervision) Dynamic Standing Balance Dynamic Standing - Balance Support: Bilateral upper extremity supported (RW) Dynamic Standing - Level of Assistance: 5: Stand by assistance (CGA) Dynamic Standing - Comments: with gait Extremity Assessment  RLE Assessment RLE Assessment: Exceptions to [facility] General Strength Comments: grossly generalized to 4-/5 LLE Assessment LLE Assessment: Exceptions to [facility] General Strength Comments: grossly generalized to [place] PT, DPT  05/27/2021, 7:33 AM

## 2021-05-27 NOTE — Progress Notes (Signed)
Speech Language Pathology Discharge Summary  Patient Details  Name: [name] MRN: [id number] Date of Birth: 1931/05/03  Today's Date: 05/27/2021 SLP Individual Time: 8891-6945 SLP Individual Time Calculation (min): 43 min   Skilled Therapeutic Interventions:  Skilled ST services focused on education and cognitive skills. SLP facilitated basic problem solving, error awareness, sustained attention, following commands and recall in 4 step ADL picture card sequence task. Pt required mod A verbal cues reducing to min A verbal cues for problem solving, recall and error awareness. Pt demonstrated improved alertness and sustained attention compared to yesterday's session, as well as improvement in skills in functional task verse formal assessment given in yesterday's session. Pt required min A verbal cues for word finding however speech intelligibility only required supervision A verbal cues to increase clarity. SLP provided education to pt and pt's daughter pertaining to need for 24 hour supervision and assistance in the above mention deficits. All questions answered to satisfaction. Pt was left in room with daughter, call bell within reach and chair alarm set. SLP recommends to continue skilled services.    Patient has met 8 of 8 long term goals.  Patient to discharge at overall Supervision;Min level.  Reasons goals not met:     Clinical Impression/Discharge Summary:   Pt made good progress meeting 8 out 8 goals, discharging at min-supervision A at a basic skill level. Pt demonstrated overall improvement in alertness, sustained attention, basic problem solving, following 1 step commands, expressing at phrase level and error awareness. Pt's barriers at discharge are primarily impacted by pt's reduced alertness/attention and memory deficits. Pt scored 9 out 30 (n=27) on cognitive linguistic assessment SLUMS, however in functional and familiar tasks demonstrated improvement in skills. Education was  completed with pt and pt's daughter whom will providing 24 hour supervision. Pt benefited from skilled ST services in order to maximize functional independence and reduce burden of care, requiring 24 supervision at discharge with continued [place] skilled ST services.   Care Partner:  Caregiver Able to Provide Assistance: Yes  Type of Caregiver Assistance: Physical;Cognitive  Recommendation:  Home Health SLP;24 hour supervision/assistance  Rationale for SLP Follow Up: Maximize functional communication;Maximize cognitive function and independence;Reduce caregiver burden   Equipment: N/A   Reasons for discharge: Discharged from hospital   Patient/Family Agrees with Progress Made and Goals Achieved: Yes    [name] 05/27/2021, 4:09 PM

## 2021-05-27 NOTE — Progress Notes (Signed)
PROGRESS NOTE   Subjective/Complaints: Accepted by [name] Patient's chart reviewed- No issues reported overnight Vitals signs stable except for elevated systolic BP to 099I.  VM left for family ed  ROS:  Pt denies SOB, abd pain, CP, N/V/C/D, and vision changes, pain    Objective:   No results found. No results for input(s): WBC, HGB, HCT, PLT in the last 72 hours.   No results for input(s): NA, K, CL, CO2, GLUCOSE, BUN, CREATININE, CALCIUM in the last 72 hours.    Intake/Output Summary (Last 24 hours) at 05/27/2021 1012 Last data filed at 05/27/2021 3382 Gross per 24 hour  Intake 387 ml  Output 1125 ml  Net -738 ml     Pressure Injury 05/05/21 Buttocks Right Stage 1 -  Intact skin with non-blanchable redness of a localized area usually over a bony prominence. small open area on right buttocks (Active)  05/05/21 1700  Location: Buttocks  Location Orientation: Right  Staging: Stage 1 -  Intact skin with non-blanchable redness of a localized area usually over a bony prominence.  Wound Description (Comments): small open area on right buttocks  Present on Admission:     Physical Exam: Vital Signs Blood pressure (!) 149/79, pulse 80, temperature 97.8 F (36.6 C), resp. rate 18, SpO2 94 %. Gen: no distress, normal appearing HEENT: oral mucosa pink and moist, NCAT Cardio: Reg rate Chest: normal effort, normal rate of breathing Abd: soft, non-distended Ext: no edema Psych: pleasant, normal affect Musculoskeletal:     Cervical back: Normal range of motion. No rigidity.     Comments: LUE/LLE 5-/5 RUE 4/5 except R hand- grip 3-/5 and FA 2-/5- has hx of chronic R hand damage- held open - not using- except as secondary support when eating RLE_ 4+/5 in HF/KE/KF/DF and PF  Skin:    General: Skin is warm and dry.     Comments: SPC and Perc drain as above IV's in L AC fossa and R wrist- look OK  Neurological:      Comments: Patient is alert.  Makes eye contact with examiner.  Provides name and age.  Follows commands.  Limited but fair medical historian. HOH- but making some jokes- Ox3 upon questioning Sensation to light touch intact x4 except R hand  Psychiatric:        Mood and Affect: Mood normal.        Behavior: Behavior normal.    Assessment/Plan: 1. Functional deficits which require 3+ hours per day of interdisciplinary therapy in a comprehensive inpatient rehab setting. Physiatrist is providing close team supervision and 24 hour management of active medical problems listed below. Physiatrist and rehab team continue to assess barriers to discharge/monitor patient progress toward functional and medical goals  Care Tool:  Bathing    Body parts bathed by patient: Right arm, Left arm, Chest, Abdomen, Front perineal area, Buttocks, Right upper leg, Left upper leg, Right lower leg, Left lower leg, Face   Body parts bathed by helper: Front perineal area, Buttocks, Right lower leg, Left lower leg     Bathing assist Assist Level: Supervision/Verbal cueing     Upper Body Dressing/Undressing Upper body dressing   What is the  patient wearing?: Pull over shirt    Upper body assist Assist Level: Set up assist    Lower Body Dressing/Undressing Lower body dressing      What is the patient wearing?: Pants, Incontinence brief     Lower body assist Assist for lower body dressing: Moderate Assistance - Patient 50 - 74%     Toileting Toileting    Toileting assist Assist for toileting: Contact Guard/Touching assist     Transfers Chair/bed transfer  Transfers assist     Chair/bed transfer assist level: Minimal Assistance - Patient > 75%     Locomotion Ambulation   Ambulation assist   Ambulation activity did not occur: Safety/medical concerns (Severe lethargy, global deconditioning)  Assist level: Contact Guard/Touching assist Assistive device: Walker-rolling Max distance:  150ft   Walk 10 feet activity   Assist  Walk 10 feet activity did not occur: Safety/medical concerns (Severe lethargy, global deconditioning)  Assist level: Contact Guard/Touching assist Assistive device: Walker-rolling   Walk 50 feet activity   Assist Walk 50 feet with 2 turns activity did not occur: Safety/medical concerns (Severe lethargy, global deconditioning)  Assist level: Contact Guard/Touching assist Assistive device: Walker-rolling    Walk 150 feet activity   Assist Walk 150 feet activity did not occur: Safety/medical concerns (fatigue, weakness, deconditioning)         Walk 10 feet on uneven surface  activity   Assist Walk 10 feet on uneven surfaces activity did not occur: Safety/medical concerns (Severe lethargy, global deconditioning)   Assist level: Contact Guard/Touching assist Assistive device: Walker-rolling   Wheelchair     Assist Is the patient using a wheelchair?: Yes Type of Wheelchair: Manual    Wheelchair assist level: Supervision/Verbal cueing Max wheelchair distance: 27ft    Wheelchair 50 feet with 2 turns activity    Assist        Assist Level: Dependent - Patient 0%   Wheelchair 150 feet activity     Assist      Assist Level: Dependent - Patient 0%   Blood pressure (!) 149/79, pulse 80, temperature 97.8 F (36.6 C), resp. rate 18, SpO2 94 %.  Medical Problem List and Plan: 1. 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  2.  Impaired mobility: Continue Subcutaneous heparin q8H.             -antiplatelet therapy: Aspirin 81 mg daily and Plavix 75  mg day x3 weeks then Plavix alone 3. Pain: N/A 4. Mood: Provide emotional support             -antipsychotic agents: N/A 5. Neuropsych: This patient is capable of making decisions on his own behalf. 6. Skin/Wound Care: Routine skin checks 7. Fluids/Electrolytes/Nutrition: Routine in and outs with follow-up chemistries 8.  Hypertension.  SBP elevated and DBP normal continue Coreg to 6.25mg  BID given bradycardia below. Increase Hydralazine to 100mg /75mg /100mg   9.  History of CAD with CABG. continue Imdur 60 mg twice daily 10.  COVID-19 pneumonia.  Treated with remdesivir and steroid.  Isolation discontinued 12/23 11.  Symptomatic anemia.  Declines blood product due to Jehovah witness..  Continue Aranesp, Hgb 5.9, up to 6.3, discussed with patient, continue to monitor as needed. Iron tablet started 12.  Diabetes mellitus.  Hemoglobin A1c 8.5.  SSI.  Patient on Glucophage 500 mg twice daily as well as Amaryl 4 mg daily prior to admission.  Resume as needed  CBG 90-144 d/c ISS. D.c amaryl and increase metformin to 500mg . Cr at baseline 13.  Percutaneous cholecystostomy drain malfunction.  Patient hospitalized [facility] in October for cholecystitis.  Continue Cardura.  Not a surgical candidate status post percutaneous cholecystostomy drain. 14.  Hyperlipidemia.  Zetia 15.  Pseudomonas UTI.  Completing course of IV Fortaz. 16. Constipation- LBM 1 week ago- will give sorbitol and monitor.   1/2- LBM 2 days ago- denies constipaiton- if no BM by tomorrow, suggest Sorbitol 17. Bradycardia: decrease Coreg to 6.25mg  BID 18. Stage 3 CKD: baseline, monitor Mondays.  19. Impaired cognition: continue SLP 20. Impaired problem solving: continue SLP.     LOS: 14 days A FACE TO FACE EVALUATION WAS PERFORMED  [name] P Silver Achey 05/27/2021, 10:12 AM

## 2021-05-27 NOTE — Progress Notes (Signed)
Occupational Therapy Session Note  Patient Details  Name: [name] MRN: [id number] Date of Birth: 04-12-1931  Today's Date: 05/27/2021 OT Individual Time: 1005-1102 & 1443-1530 OT Individual Time Calculation (min): 57 min & 47 min   Short Term Goals: Week 2:  OT Short Term Goal 1 (Week 2): STG = LTG due to ELOS  Skilled Therapeutic Interventions/Progress Updates:  Session 1 Skilled OT intervention completed with focus on ADL re-training, family education regarding assist level and home management recommendations, activity tolerance. Pt received supine in bed, agreeable to session. Pt lethargic this morning, with increased time needed to arouse pt for functional participation in therapy. Pt completed bed mobility with HOB flat, with supervision using rail as assist for pushing trunk up. Cues needed for scooting hips forward, then sit > stand with bed elevated at CGA using RW, then stand pivot to recliner with CGA. Pt completed UB dressing and grooming tasks with set up A, then LB dressing with min A this session. Pt requiring multiple attempts to stand from recliner during LB clothing management with initially max A to stand using RW due to low level, with pt with increased fatigue, but fading with cues provided to mod A for standing. Pt's daughter in room, seeking guidance about pt's assist level needed, car transfers, and about senior care services that provide "sitting" vs medical care. Therapist addressed daughter's concerns, with plan to educate daughter in PM session with hands on practice to ensure pt's daughter's concerns/awareness of pt's CLOF. Pt left seated in recliner, with belt alarm on, and all needs in reach with daughter in room at end of session.  Session 2 Skilled OT intervention completed with focus on family education with pt's daughter, with hands on practice with sit > stands, ambulation, toileting tasks and bed mobility. Pt received seated in recliner, with daughter  asking if pt could go to the bathroom on his own. Therapist advising against due to decreased balance and endurance with therapsit re-educating on CGA-supervision assist needed for ambulation, and min A needed for toileting tasks. Pt completed sit> stand with mod A using RW then ambulatory transfer with CGA to toilet in bathroom. Daughter present and observing assist needed with education provided about recommendations for incontinent episodes and supplies. Pt completed posterior pericare with CGA for balance and donning of brief/pants with min A. Pt's daughter participating in hands on practice with assisting pt from sit > stand from low surface, as well assisting pt with CGA during ambulating 20 ft using RW with safety and good body mechanics demonstrated while assisting pt. Therapist addressed daughter's questions and concerns with cues provided on technique to increase safety. Therapist educated pt and pt's daughter on DME set up of BSC and TTB, with demonstration of how to assemble and tips for setting items up in the home for safe use. Pt requested to return to bed at end of session with daughter providing supervision for EOB > supine bed mobility. Educated on use of applying bed rail to bed at home for pt's efficiency. Pt left supine in bed, with daughter in room, bed alarm on and all needs in reach at end of session.   Therapy Documentation Precautions:  Precautions Precautions: Fall Precaution Comments: cognitive deficits, can be hard of hearing at times Restrictions Weight Bearing Restrictions: No  Pain: No c/o pain   Therapy/Group: Individual Therapy  Nahima Ales E Monet [place] 05/27/2021, 7:33 AM

## 2021-05-28 LAB — GLUCOSE, CAPILLARY: Glucose-Capillary: 111 mg/dL — ABNORMAL HIGH (ref 70–99)

## 2021-05-28 NOTE — Progress Notes (Signed)
Inpatient Rehabilitation Discharge Medication Review by a Pharmacist  A complete drug regimen review was completed for this patient to identify any potential clinically significant medication issues.  High Risk Drug Classes Is patient taking? Indication by Medication  Antipsychotic No   Anticoagulant No   Antibiotic No   Opioid No   Antiplatelet Yes ASA through 1/11 (today is last dose) then d/c on Plavix alone   Hypoglycemics/insulin Yes Metformin for DM  Vasoactive Medication Yes Coreg, Hydralazine, Imdur, Norvasc, Doxazosin, NTG SL prn for CAD/HTN, BPH, [place], and afib  Chemotherapy No   Other No      Type of Medication Issue Identified Description of Issue Recommendation(s)  Drug Interaction(s) (clinically significant)     Duplicate Therapy     Allergy     No Medication Administration End Date     Incorrect Dose     Additional Drug Therapy Needed     Significant med changes from prior encounter (inform family/care partners about these prior to discharge).  D/c PTA Amaryl and Lasix  Other       Clinically significant medication issues were identified that warrant physician communication and completion of prescribed/recommended actions by midnight of the next day:  Yes, please d/c ASA from discharge summary   Form of communication: Chat Communicated to: [place], [place] Pharmacist notes: PA will d/c ASA from d/c summary. No further f/u required. Time spent performing this drug regimen review (minutes):  40min  Erven Ramson S. Alford Highland, PharmD, BCPS Clinical Staff Pharmacist [email or web address] [place] 05/28/2021 7:10 AM

## 2021-05-28 NOTE — Progress Notes (Signed)
PROGRESS NOTE   Subjective/Complaints: D/c home today with daughter No new concerns expressed He appears alert and smiling Discussed follow-up d/c plan with daughter  ROS:  Pt denies SOB, abd pain, CP, N/V/C/D, and vision changes, pain    Objective:   No results found. No results for input(s): WBC, HGB, HCT, PLT in the last 72 hours.   No results for input(s): NA, K, CL, CO2, GLUCOSE, BUN, CREATININE, CALCIUM in the last 72 hours.    Intake/Output Summary (Last 24 hours) at 05/28/2021 1049 Last data filed at 05/28/2021 0803 Gross per 24 hour  Intake 800 ml  Output 1350 ml  Net -550 ml     Pressure Injury 05/05/21 Buttocks Right Stage 1 -  Intact skin with non-blanchable redness of a localized area usually over a bony prominence. small open area on right buttocks (Active)  05/05/21 1700  Location: Buttocks  Location Orientation: Right  Staging: Stage 1 -  Intact skin with non-blanchable redness of a localized area usually over a bony prominence.  Wound Description (Comments): small open area on right buttocks  Present on Admission:     Physical Exam: Vital Signs Blood pressure (!) 132/50, pulse 76, temperature 97.7 F (36.5 C), resp. rate 18, SpO2 90 %. Gen: no distress, normal appearing HEENT: oral mucosa pink and moist, NCAT Cardio: Reg rate Chest: normal effort, normal rate of breathing Abd: soft, non-distended Ext: no edema Psych: pleasant, normal affect Musculoskeletal:     Cervical back: Normal range of motion. No rigidity.     Comments: LUE/LLE 5-/5 RUE 4/5 except R hand- grip 3-/5 and FA 2-/5- has hx of chronic R hand damage- held open - not using- except as secondary support when eating RLE_ 4+/5 in HF/KE/KF/DF and PF  Skin:    General: Skin is warm and dry.     Comments: SPC and Perc drain as above IV's in L AC fossa and R wrist- look OK  Neurological:     Comments: Patient is alert.  Makes  eye contact with examiner.  Provides name and age.  Follows commands.  Limited but fair medical historian. HOH- but making some jokes- Ox3 upon questioning Sensation to light touch intact x4 except R hand  Psychiatric:        Mood and Affect: Mood normal.        Behavior: Behavior normal.    Assessment/Plan: 1. Functional deficits which require 3+ hours per day of interdisciplinary therapy in a comprehensive inpatient rehab setting. Physiatrist is providing close team supervision and 24 hour management of active medical problems listed below. Physiatrist and rehab team continue to assess barriers to discharge/monitor patient progress toward functional and medical goals  Care Tool:  Bathing    Body parts bathed by patient: Right arm, Left arm, Chest, Abdomen, Front perineal area, Buttocks, Right upper leg, Left upper leg, Right lower leg, Left lower leg, Face   Body parts bathed by helper: Front perineal area, Buttocks, Right lower leg, Left lower leg     Bathing assist Assist Level: Supervision/Verbal cueing     Upper Body Dressing/Undressing Upper body dressing   What is the patient wearing?: Pull over shirt  Upper body assist Assist Level: Set up assist    Lower Body Dressing/Undressing Lower body dressing      What is the patient wearing?: Pants, Incontinence brief     Lower body assist Assist for lower body dressing: Supervision/Verbal cueing     Toileting Toileting    Toileting assist Assist for toileting: Contact Guard/Touching assist     Transfers Chair/bed transfer  Transfers assist     Chair/bed transfer assist level: Minimal Assistance - Patient > 75%     Locomotion Ambulation   Ambulation assist   Ambulation activity did not occur: Safety/medical concerns (Severe lethargy, global deconditioning)  Assist level: Contact Guard/Touching assist Assistive device: Walker-rolling Max distance: 123ft   Walk 10 feet activity   Assist  Walk  10 feet activity did not occur: Safety/medical concerns (Severe lethargy, global deconditioning)  Assist level: Contact Guard/Touching assist Assistive device: Walker-rolling   Walk 50 feet activity   Assist Walk 50 feet with 2 turns activity did not occur: Safety/medical concerns (Severe lethargy, global deconditioning)  Assist level: Contact Guard/Touching assist Assistive device: Walker-rolling    Walk 150 feet activity   Assist Walk 150 feet activity did not occur: Safety/medical concerns (fatigue, weakness, deconditioning)         Walk 10 feet on uneven surface  activity   Assist Walk 10 feet on uneven surfaces activity did not occur: Safety/medical concerns (Severe lethargy, global deconditioning)   Assist level: Contact Guard/Touching assist Assistive device: Walker-rolling   Wheelchair     Assist Is the patient using a wheelchair?: Yes Type of Wheelchair: Manual    Wheelchair assist level: Supervision/Verbal cueing Max wheelchair distance: 56ft    Wheelchair 50 feet with 2 turns activity    Assist        Assist Level: Dependent - Patient 0%   Wheelchair 150 feet activity     Assist      Assist Level: Dependent - Patient 0%   Blood pressure (!) 132/50, pulse 76, temperature 97.7 F (36.5 C), resp. rate 18, SpO2 90 %.  Medical Problem List and Plan: 1. 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  Impaired mobility: Discontinue Subcutaneous heparin q8H upon d/c             -antiplatelet therapy: Aspirin 81 mg daily and Plavix 75 mg day x3 weeks then Plavix alone 3. Pain:  N/A 4. Mood: Provide emotional support             -antipsychotic agents: N/A 5. Neuropsych: This patient is capable of making decisions on his own behalf. 6. Skin/Wound Care: Routine skin checks 7. Fluids/Electrolytes/Nutrition: Routine in and outs with follow-up chemistries 8.  Hypertension.  SBP elevated and DBP normal continue Coreg to 6.25mg  BID given bradycardia below. Increase Hydralazine to 100mg /75mg /100mg   9.  History of CAD with CABG. continue Imdur 60 mg twice daily 10.  COVID-19 pneumonia.  Treated with remdesivir and steroid.  Isolation discontinued 12/23 11.  Symptomatic anemia.  Declines blood product due to Jehovah witness.Marland Kitchen  Continue Aranesp, Hgb 5.9, up to 6.3, discussed with patient, continue to monitor as needed. Iron tablet started 12.  Diabetes mellitus.  Hemoglobin A1c 8.5.  SSI.  Patient on Glucophage 500 mg twice daily as well as Amaryl 4 mg daily prior to admission.  Resume as needed  CBG 90-144 d/c ISS. D.c amaryl and increase metformin to 500mg . Cr at baseline 13.  Percutaneous cholecystostomy drain malfunction.  Patient hospitalized [facility] in October for cholecystitis.  Continue Cardura.  Not a surgical candidate status post percutaneous cholecystostomy drain. 14.  Hyperlipidemia.  Zetia 15.  Pseudomonas UTI.  Completing course of IV Fortaz. 16. Constipation- LBM 1 week ago- will give sorbitol and monitor.   1/2- LBM 2 days ago- denies constipaiton- if no BM by tomorrow, suggest Sorbitol 17. Bradycardia: decrease Coreg to 6.25mg  BID 18. Stage 3 CKD: baseline, monitor Mondays.  19. Impaired cognition: continue SLP 20. Impaired problem solving: continue SLP.     LOS: 15 days A FACE TO FACE EVALUATION WAS PERFORMED  [name] 05/28/2021, 10:49 AM

## 2021-05-28 NOTE — Progress Notes (Signed)
Patient discharged off of unit with all belongings. Discharge papers/instructions explained by physician assistant to family. Patient and family have no further questions at time of discharge. No complications noted at this time.  [name] L Damain Broadus  

## 2021-05-28 NOTE — Progress Notes (Signed)
Inpatient Rehabilitation Care Coordinator Discharge Note   Patient Details  Name: [name] MRN: [id number] Date of Birth: [birth year]   Discharge location: Home  Length of Stay: 15 Days  Discharge activity level: Min/Min  Home/community participation: Daughter  Patient response JH:HIDUPB Literacy - How often do you need to have someone help you when you read instructions, pamphlets, or other written material from your doctor or pharmacy?: Often  Patient response DH:DIXBOE Isolation - How often do you feel lonely or isolated from those around you?: Never  Services provided included: SW, Pharmacy, TR, CM, RN, SLP, OT, PT, RD, MD  Financial Services:  Financial Services Utilized: Medicare    Choices offered to/list presented to: patient daughter  Follow-up services arranged:  [place]: Alvis Lemmings         Patient response to transportation need: Is the patient able to respond to transportation needs?: Yes In the past 12 months, has lack of transportation kept you from medical appointments or from getting medications?: No In the past 12 months, has lack of transportation kept you from meetings, work, or from getting things needed for daily living?: No    Comments (or additional information):  Patient/Family verbalized understanding of follow-up arrangements:  Yes  Individual responsible for coordination of the follow-up plan: [name] [phone]  Confirmed correct DME delivered: Dyanne Iha 05/28/2021    Dyanne Iha

## 2021-06-03 ENCOUNTER — Telehealth: Payer: Self-pay

## 2021-06-03 NOTE — Telephone Encounter (Signed)
Barnetta Chapel, RN called to give nurse orders on patient. She states she is going to see him once every other week 4 times. She also wanted [place] to know that the patient was irregular before with 50-90 beats.

## 2021-06-05 ENCOUNTER — Telehealth: Payer: Self-pay

## 2021-06-05 NOTE — Telephone Encounter (Signed)
[name] in -home occupational therapist with [facility] called for verbal orders. Okay given for in-home OT once a week for 8 weeks. Hospital notes reviewed.

## 2021-06-06 ENCOUNTER — Telehealth: Payer: Self-pay | Admitting: Pharmacist

## 2021-06-06 ENCOUNTER — Other Ambulatory Visit: Payer: Self-pay

## 2021-06-06 NOTE — Telephone Encounter (Signed)
Pharmacy Transitions of Care Follow-up Telephone Call  Date of discharge: 05/28/2021  Discharge Diagnosis: Lacunar Infractions  How have you been since you were released from the hospital? I spoke with the patient's daughter and he has been doing well since discharge home.   Medication changes made at discharge:  - START: clopidogrel 75mg  daily  - STOPPED:   - CHANGED:   Medication changes verified by the patient? Yes    Medication Accessibility:  Home Pharmacy: n/a   Was the patient provided with refills on discharged medications? no   Have all prescriptions been transferred from [facility] to home pharmacy? N/a   Is the patient able to afford medications? N/a Notable copays: n/a Eligible patient assistance: n/a    Medication Review:  CLOPIDOGREL (PLAVIX) Clopidogrel 75 mg once daily.  - Educated patient on expected duration of therapy of aspirin for 2 more days with clopidogrel. - Reviewed potential DDIs with patient  - Advised patient of medications to avoid (NSAIDs, ASA)  - Educated that Tylenol (acetaminophen) will be the preferred analgesic to prevent risk of bleeding  - Emphasized importance of monitoring for signs and symptoms of bleeding (abnormal bruising, prolonged bleeding, nose bleeds, bleeding from gums, discolored urine, black tarry stools)  - Advised patient to alert all providers of anticoagulation therapy prior to starting a new medication or having a procedure   Follow-up Appointments:  PCP Hospital f/u appt confirmed? yes Scheduled to see primary provider on 1 /23/23.   [facility] f/u appt confirmed? yes Scheduled to see neurologist in March.  If their condition worsens, is the pt aware to call PCP or go to the Emergency Dept.? Yes  Final Patient Assessment: Patient is seeing his primary provider on Monday 06/09/2021. I advised patient's daughter to take medication list to appointment and ask for refills on his medications. Patient does not have  enough medication to last until follow up visit with neurology.

## 2021-06-12 DIAGNOSIS — I69351 Hemiplegia and hemiparesis following cerebral infarction affecting right dominant side: Secondary | ICD-10-CM | POA: Diagnosis not present

## 2021-06-12 DIAGNOSIS — N183 Chronic kidney disease, stage 3 unspecified: Secondary | ICD-10-CM

## 2021-06-12 DIAGNOSIS — I700 Atherosclerosis of aorta: Secondary | ICD-10-CM

## 2021-06-12 DIAGNOSIS — I2510 Atherosclerotic heart disease of native coronary artery without angina pectoris: Secondary | ICD-10-CM

## 2021-06-12 DIAGNOSIS — K810 Acute cholecystitis: Secondary | ICD-10-CM | POA: Diagnosis not present

## 2021-06-12 DIAGNOSIS — E1122 Type 2 diabetes mellitus with diabetic chronic kidney disease: Secondary | ICD-10-CM

## 2021-06-12 DIAGNOSIS — I5042 Chronic combined systolic (congestive) and diastolic (congestive) heart failure: Secondary | ICD-10-CM

## 2021-06-12 DIAGNOSIS — K5730 Diverticulosis of large intestine without perforation or abscess without bleeding: Secondary | ICD-10-CM

## 2021-06-12 DIAGNOSIS — I130 Hypertensive heart and chronic kidney disease with heart failure and stage 1 through stage 4 chronic kidney disease, or unspecified chronic kidney disease: Secondary | ICD-10-CM | POA: Diagnosis not present

## 2021-06-12 DIAGNOSIS — I252 Old myocardial infarction: Secondary | ICD-10-CM

## 2021-06-12 DIAGNOSIS — I714 Abdominal aortic aneurysm, without rupture, unspecified: Secondary | ICD-10-CM

## 2021-06-12 DIAGNOSIS — Z466 Encounter for fitting and adjustment of urinary device: Secondary | ICD-10-CM | POA: Diagnosis not present

## 2021-06-12 DIAGNOSIS — Q2546 Tortuous aortic arch: Secondary | ICD-10-CM

## 2021-06-12 DIAGNOSIS — I081 Rheumatic disorders of both mitral and tricuspid valves: Secondary | ICD-10-CM

## 2021-06-12 DIAGNOSIS — D631 Anemia in chronic kidney disease: Secondary | ICD-10-CM

## 2021-06-17 ENCOUNTER — Ambulatory Visit (HOSPITAL_COMMUNITY)
Admission: RE | Admit: 2021-06-17 | Discharge: 2021-06-17 | Disposition: A | Discharge status: Home / Self Care | Payer: Medicare Other | Source: Ambulatory Visit | Attending: Internal Medicine | Admitting: Internal Medicine

## 2021-06-17 ENCOUNTER — Other Ambulatory Visit: Payer: Self-pay

## 2021-06-17 ENCOUNTER — Inpatient Hospital Stay (HOSPITAL_COMMUNITY): Admit: 2021-06-17 | Payer: Medicare Other

## 2021-06-17 ENCOUNTER — Other Ambulatory Visit (HOSPITAL_COMMUNITY): Payer: Self-pay | Admitting: Interventional Radiology

## 2021-06-17 ENCOUNTER — Other Ambulatory Visit (HOSPITAL_COMMUNITY): Payer: Self-pay | Admitting: Internal Medicine

## 2021-06-17 ENCOUNTER — Ambulatory Visit (HOSPITAL_COMMUNITY): Payer: Medicare Other

## 2021-06-17 DIAGNOSIS — N189 Chronic kidney disease, unspecified: Secondary | ICD-10-CM

## 2021-06-17 DIAGNOSIS — Z434 Encounter for attention to other artificial openings of digestive tract: Secondary | ICD-10-CM | POA: Diagnosis not present

## 2021-06-17 HISTORY — PX: IR EXCHANGE BILIARY DRAIN: IMG6046

## 2021-06-17 MED ORDER — IOHEXOL 300 MG/ML  SOLN
100.0000 mL | Freq: Once | INTRAMUSCULAR | Status: AC | PRN
Start: 2021-06-17 — End: 2021-06-17
  Administered 2021-06-17: 10 mL

## 2021-06-17 MED ORDER — LIDOCAINE HCL 1 % IJ SOLN
INTRAMUSCULAR | Status: AC
  Filled 2021-06-17: qty 20

## 2021-06-17 NOTE — Procedures (Signed)
Interventional Radiology Procedure Note  Procedure: cholecystostomy exchg    Complications: None  Estimated Blood Loss:  0  Findings: Cystic duct and CBD patent.  Contrast drains into duodenum fairly quickly.  Drain exchg'd  Cap trial initiated.  D/w with daughter who cares for the tube    Tamera Punt, MD

## 2021-06-20 ENCOUNTER — Emergency Department (HOSPITAL_COMMUNITY)
Admission: EM | Admit: 2021-06-20 | Discharge: 2021-06-20 | Disposition: A | Discharge status: Home / Self Care | Payer: Medicare Other | Attending: Emergency Medicine | Admitting: Emergency Medicine

## 2021-06-20 ENCOUNTER — Other Ambulatory Visit (HOSPITAL_COMMUNITY): Payer: Self-pay

## 2021-06-20 DIAGNOSIS — B964 Proteus (mirabilis) (morganii) as the cause of diseases classified elsewhere: Secondary | ICD-10-CM | POA: Diagnosis not present

## 2021-06-20 DIAGNOSIS — Z7984 Long term (current) use of oral hypoglycemic drugs: Secondary | ICD-10-CM | POA: Diagnosis not present

## 2021-06-20 DIAGNOSIS — N99511 Cystostomy infection: Secondary | ICD-10-CM | POA: Diagnosis not present

## 2021-06-20 DIAGNOSIS — Z7902 Long term (current) use of antithrombotics/antiplatelets: Secondary | ICD-10-CM | POA: Insufficient documentation

## 2021-06-20 DIAGNOSIS — N390 Urinary tract infection, site not specified: Secondary | ICD-10-CM | POA: Diagnosis not present

## 2021-06-20 DIAGNOSIS — Z79899 Other long term (current) drug therapy: Secondary | ICD-10-CM | POA: Insufficient documentation

## 2021-06-20 DIAGNOSIS — R32 Unspecified urinary incontinence: Secondary | ICD-10-CM | POA: Diagnosis present

## 2021-06-20 LAB — CBC WITH DIFFERENTIAL/PLATELET
Abs Immature Granulocytes: 0 10*3/uL (ref 0.00–0.07)
Basophils Absolute: 0 10*3/uL (ref 0.0–0.1)
Basophils Relative: 1 %
Eosinophils Absolute: 0.1 10*3/uL (ref 0.0–0.5)
Eosinophils Relative: 4 %
HCT: 18.9 % — ABNORMAL LOW (ref 39.0–52.0)
Hemoglobin: 6.2 g/dL — CL (ref 13.0–17.0)
Lymphocytes Relative: 52 %
Lymphs Abs: 1.6 10*3/uL (ref 0.7–4.0)
MCH: 31.3 pg (ref 26.0–34.0)
MCHC: 32.8 g/dL (ref 30.0–36.0)
MCV: 95.5 fL (ref 80.0–100.0)
Monocytes Absolute: 0.1 10*3/uL (ref 0.1–1.0)
Monocytes Relative: 4 %
Neutro Abs: 1.2 10*3/uL — ABNORMAL LOW (ref 1.7–7.7)
Neutrophils Relative %: 39 %
Platelets: 231 10*3/uL (ref 150–400)
RBC: 1.98 MIL/uL — ABNORMAL LOW (ref 4.22–5.81)
RDW: 23.4 % — ABNORMAL HIGH (ref 11.5–15.5)
WBC: 3.1 10*3/uL — ABNORMAL LOW (ref 4.0–10.5)
nRBC: 0.6 % — ABNORMAL HIGH (ref 0.0–0.2)
nRBC: 4 /100 WBC — ABNORMAL HIGH

## 2021-06-20 LAB — URINALYSIS, ROUTINE W REFLEX MICROSCOPIC
Bilirubin Urine: NEGATIVE
Glucose, UA: NEGATIVE mg/dL
Hgb urine dipstick: NEGATIVE
Ketones, ur: NEGATIVE mg/dL
Nitrite: NEGATIVE
Protein, ur: 30 mg/dL — AB
Specific Gravity, Urine: 1.01 (ref 1.005–1.030)
pH: 8.5 — ABNORMAL HIGH (ref 5.0–8.0)

## 2021-06-20 LAB — URINALYSIS, MICROSCOPIC (REFLEX)

## 2021-06-20 LAB — BASIC METABOLIC PANEL
Anion gap: 7 (ref 5–15)
BUN: 34 mg/dL — ABNORMAL HIGH (ref 8–23)
CO2: 21 mmol/L — ABNORMAL LOW (ref 22–32)
Calcium: 8.3 mg/dL — ABNORMAL LOW (ref 8.9–10.3)
Chloride: 102 mmol/L (ref 98–111)
Creatinine, Ser: 2.44 mg/dL — ABNORMAL HIGH (ref 0.61–1.24)
GFR, Estimated: 25 mL/min — ABNORMAL LOW (ref >60)
Glucose, Bld: 187 mg/dL — ABNORMAL HIGH (ref 70–99)
Potassium: 4.9 mmol/L (ref 3.5–5.1)
Sodium: 130 mmol/L — ABNORMAL LOW (ref 135–145)

## 2021-06-20 MED ORDER — CIPROFLOXACIN HCL 500 MG PO TABS
500.0000 mg | ORAL_TABLET | Freq: Two times a day (BID) | ORAL | 0 refills | Status: DC
  Filled 2021-06-20: qty 10, 5d supply, fill #0

## 2021-06-20 MED ORDER — CIPROFLOXACIN HCL 500 MG PO TABS
500.0000 mg | ORAL_TABLET | Freq: Once | ORAL | Status: AC
Start: 2021-06-20 — End: 2021-06-20
  Administered 2021-06-20: 500 mg via ORAL
  Filled 2021-06-20: qty 1

## 2021-06-20 MED ORDER — CIPROFLOXACIN HCL 500 MG PO TABS: 500.0000 mg | ORAL_TABLET | Freq: Two times a day (BID) | ORAL | 0 refills | Status: DC

## 2021-06-20 NOTE — ED Provider Triage Note (Signed)
Emergency Medicine Provider Triage Evaluation Note  KRISTY CATOE , a 88 y.o. male  was evaluated in triage.  Pt complains of urinary urgency and dysuria last couple days.  Patient does have a chronic indwelling catheter and was last replaced about a week ago.  No abdominal pain, fever, or chills.  Review of Systems  Positive:  Negative: See above   Physical Exam  BP (!) 156/84 (BP Location: Left Arm)    Pulse (!) 102    Resp 14    SpO2 96%  Gen:   Awake, no distress   Resp:  Normal effort  MSK:   Moves extremities without difficulty  Other:  No abdominal tenderness  Medical Decision Making  Medically screening exam initiated at 11:21 AM.  Appropriate orders placed.  [name] was informed that the remainder of the evaluation will be completed by another provider, this initial triage assessment does not replace that evaluation, and the importance of remaining in the ED until their evaluation is complete.     Myna Bright [place], [place] 06/20/21 1122

## 2021-06-20 NOTE — ED Triage Notes (Signed)
Pt. Stated, [name] been unable to pee even though I have a catheter. I can eventually but I have to staND UP. I feel like Im getting weaker. I pee about 20 minutes ago . The urge is about a 7/10

## 2021-06-20 NOTE — Discharge Instructions (Signed)
As discussed, please monitor your father's condition carefully and do not hesitate to return here otherwise, follow-up with his urologist next week.

## 2021-06-20 NOTE — ED Provider Notes (Signed)
[facility] EMERGENCY DEPARTMENT Provider Note   CSN: [id number] Arrival date & time: 06/20/21  1109     History  Chief Complaint  Patient presents with   Urinary Retention   Dysuria    [name] is a 88 y.o. male.  HPI Patient presents less than 1 month after being evaluated, seen, admitted, and discharged, now with concern for spasm in the suprapubic region, inconsistent urine output.  Patient has a suprapubic catheter.  He is here with his daughter who assists with the history.  No fever, chest pain, dyspnea or other new complaints.    Home Medications Prior to Admission medications   Medication Sig Start Date End Date Taking? Authorizing Provider  acetaminophen (TYLENOL) 325 MG tablet Take 2 tablets (650 mg total) by mouth every 6 (six) hours as needed for mild pain (or Fever >/= 101). 05/31/18   Angiulli, Lavon Paganini, PA-C  albuterol (VENTOLIN HFA) 108 (90 Base) MCG/ACT inhaler Inhale 2 puffs into the lungs every 2 (two) hours as needed for wheezing or shortness of breath. 05/27/21   Angiulli, Lavon Paganini, PA-C  amLODipine (NORVASC) 10 MG tablet Take 1 tablet (10 mg total) by mouth daily in the afternoon. 05/27/21   Angiulli, Lavon Paganini, PA-C  ascorbic acid (VITAMIN C) 500 MG tablet Take 1 tablet (500 mg total) by mouth daily. 05/27/21   Angiulli, Lavon Paganini, PA-C  carvedilol (COREG) 6.25 MG tablet Take 1 tablet (6.25 mg total) by mouth 2 (two) times daily with a meal. 05/27/21   Angiulli, Lavon Paganini, PA-C  clopidogrel (PLAVIX) 75 MG tablet Take 1 tablet (75 mg total) by mouth daily. 05/27/21   Angiulli, Lavon Paganini, PA-C  doxazosin (CARDURA) 4 MG tablet Take 1 tablet (4 mg total) by mouth daily. 05/27/21   Angiulli, Lavon Paganini, PA-C  ezetimibe (ZETIA) 10 MG tablet Take 1 tablet (10 mg total) by mouth daily. 05/27/21 08/25/21  Angiulli, Lavon Paganini, PA-C  ferrous sulfate 325 (65 FE) MG tablet Take 1 tablet (325 mg total) by mouth daily. 05/27/21   Angiulli, Lavon Paganini, PA-C  folic acid  (FOLVITE) 1 MG tablet Take 1 tablet (1 mg total) by mouth daily. 05/27/21   Angiulli, Lavon Paganini, PA-C  guaiFENesin (MUCINEX) 600 MG 12 hr tablet Take 1 tablet (600 mg total) by mouth 2 (two) times daily. 05/27/21   Angiulli, Lavon Paganini, PA-C  hydrALAZINE (APRESOLINE) 100 MG tablet Take 1 tablet (100 mg total) by mouth 2 (two) times daily. 05/27/21   Angiulli, Lavon Paganini, PA-C  hydrALAZINE (APRESOLINE) 25 MG tablet Take 3 tablets (75 mg total) by mouth daily at 2 PM. 05/27/21   Angiulli, Lavon Paganini, PA-C  isosorbide mononitrate (IMDUR) 60 MG 24 hr tablet Take 1 tablet (60 mg total) by mouth 2 (two) times daily. 05/27/21   Angiulli, Lavon Paganini, PA-C  metFORMIN (GLUCOPHAGE) 500 MG tablet Take 1 tablet (500 mg total) by mouth 2 (two) times daily with a meal. 05/27/21   Angiulli, Lavon Paganini, PA-C  nitroGLYCERIN (NITROSTAT) 0.4 MG SL tablet Place 0.4 mg under the tongue every 5 (five) minutes as needed for chest pain. 03/27/19   [provider]  oxybutynin (DITROPAN) 5 MG tablet Take 5 mg by mouth daily as needed for bladder spasms.  09/07/17   [provider]  pantoprazole (PROTONIX) 40 MG tablet Take 1 tablet (40 mg total) by mouth daily. 05/27/21   Angiulli, Lavon Paganini, PA-C  polyethylene glycol (MIRALAX / GLYCOLAX) 17 g packet Take  17 g by mouth daily. 05/27/21   Angiulli, Lavon Paganini, PA-C  saccharomyces boulardii (FLORASTOR) 250 MG capsule Take 1 capsule (250 mg total) by mouth 2 (two) times daily. 05/27/21   Angiulli, Lavon Paganini, PA-C  sodium bicarbonate 650 MG tablet Take 1 tablet (650 mg total) by mouth 2 (two) times daily. 05/27/21   Angiulli, Lavon Paganini, PA-C  cyanocobalamin 1000 MCG tablet Take 1 tablet (1,000 mcg total) by mouth daily. 05/27/21   Angiulli, Lavon Paganini, PA-C  Zinc Sulfate 220 (50 Zn) MG TABS Take 1 tablet (220 mg total) by mouth daily. 05/27/21   Angiulli, Lavon Paganini, PA-C      Allergies    Atorvastatin, Crestor [rosuvastatin], Other, Pravastatin, Simvastatin, and Statins    Review of Systems    Review of Systems  Constitutional:        Per HPI, otherwise negative  HENT:         Per HPI, otherwise negative  Respiratory:         Per HPI, otherwise negative  Cardiovascular:        Per HPI, otherwise negative  Gastrointestinal:  Negative for vomiting.  Endocrine:       Negative aside from HPI  Genitourinary:        Neg aside from HPI   Musculoskeletal:        Per HPI, otherwise negative  Skin: Negative.   Neurological:  Negative for syncope.  Hematological:        Patient with known anemia.  He is a Restaurant manager, fast food.   Physical Exam Updated Vital Signs BP 121/79    Pulse (!) 32    Resp 16    SpO2 95%  Physical Exam Vitals and nursing note reviewed.  Constitutional:      General: He is not in acute distress.    Appearance: He is well-developed.  HENT:     Head: Normocephalic and atraumatic.  Eyes:     Conjunctiva/sclera: Conjunctivae normal.  Cardiovascular:     Rate and Rhythm: Normal rate and regular rhythm.  Pulmonary:     Effort: Pulmonary effort is normal. No respiratory distress.     Breath sounds: No stridor.  Abdominal:     General: There is no distension.     Tenderness: There is no abdominal tenderness. There is no guarding.     Comments: Suprapubic catheter in place, draining slightly opaque fluid with sediment visible.  Skin:    General: Skin is warm and dry.  Neurological:     Mental Status: He is alert and oriented to person, place, and time.    ED Results / Procedures / Treatments   Labs (all labs ordered are listed, but only abnormal results are displayed) Labs Reviewed  CBC WITH DIFFERENTIAL/PLATELET - Abnormal; Notable for the following components:      Result Value   WBC 3.1 (*)    RBC 1.98 (*)    Hemoglobin 6.2 (*)    HCT 18.9 (*)    RDW 23.4 (*)    nRBC 0.6 (*)    Neutro Abs 1.2 (*)    nRBC 4 (*)    All other components within normal limits  BASIC METABOLIC PANEL - Abnormal; Notable for the following components:   Sodium 130  (*)    CO2 21 (*)    Glucose, Bld 187 (*)    BUN 34 (*)    Creatinine, Ser 2.44 (*)    Calcium 8.3 (*)    GFR, Estimated 25 (*)  All other components within normal limits  URINE CULTURE  URINALYSIS, ROUTINE W REFLEX MICROSCOPIC    EKG None  Radiology No results found.  Procedures Procedures    Medications Ordered in ED Medications - No data to display  ED Course/ Medical Decision Making/ A&P  This patient presents to the ED for concern of suprapubic pain decreased urine output, this involves an extensive number of treatment options, and is a complaint that carries with it a high risk of complications and morbidity.  The differential diagnosis includes possible urinary retention versus abdominal pain versus urinary tract infection, less likely bacteremia or sepsis   Co morbidities that complicate the patient evaluation  Advanced age, multiple medical problems, suprapubic catheter   Social Determinants of Health:  Age   Additional history obtained:  Additional history and/or information obtained from daughter, chart review External records from outside source obtained and reviewed including discharge from earlier this month following admission for pneumonia, recent diagnosis of COVID, and exchange of his suprapubic catheter performed 2 days ago.   After the initial evaluation, orders, including: Labs, urinalysis, bladder scan, catheter irrigation were initiated.  Patient placed on Cardiac and Pulse-Oximetry Monitors. The patient was maintained on a cardiac monitor.  The cardiac monitored showed an rhythm of 60s, sinus The patient was also maintained on pulse oximetry. The readings were typically 95% room air borderline On repeat evaluation of the patient stayed the same  Lab Tests:  I personally interpreted labs.  The pertinent results include: Chemistry panel without substantial electrolyte abnormalities, no leukocytosis on CBC.  Urinalysis suggestive of  infection, consistent with prior studies, and on reviewing his prior urine culture positive for Pseudomonas previously, sensitive to ciprofloxacin    Dispostion / Final MDM:  After consideration of the diagnostic results and the patient's response to treatment, he will start ciprofloxacin as above, previously demonstrated to be appropriate for urine culture results.  Here no evidence for bacteremia, sepsis, no fever, no leukocytosis, no distress.  Some suspicion for spasm possibly secondary to indwelling catheter with infection.  Patient continues to have urine output, bowel movements, has no distress, soft, nontender abdomen, daughter is comfortable with following up with urology next week.  Hospitalization considered, given his comorbidities, but with access to follow-up, initiation of antibiotics, patient appropriate for discharge. Final Clinical Impression(s) / ED Diagnoses Final diagnoses:  Urinary tract infection associated with cystostomy catheter, initial encounter [facility])    Rx / DC Orders ED Discharge Orders          Ordered    ciprofloxacin (CIPRO) 500 MG tablet  Every 12 hours,   Status:  Discontinued        06/20/21 1625    ciprofloxacin (CIPRO) 500 MG tablet  Every 12 hours        06/20/21 1625              Carmin Muskrat, MD 06/20/21 1627

## 2021-06-22 LAB — URINE CULTURE: Culture: >=100000 — AB

## 2021-06-23 ENCOUNTER — Telehealth (HOSPITAL_BASED_OUTPATIENT_CLINIC_OR_DEPARTMENT_OTHER): Payer: Self-pay | Admitting: *Deleted

## 2021-06-23 NOTE — Telephone Encounter (Signed)
Post ED Visit - Positive Culture Follow-up  Culture report reviewed by antimicrobial stewardship pharmacist: [place] Team []  Elenor Quinones, Pharm.D. []  Heide Guile, Pharm.D., BCPS AQ-ID []  Parks Neptune, Pharm.D., BCPS []  Alycia Rossetti, Pharm.D., BCPS []  [place], Pharm.D., BCPS, AAHIVP []  Legrand Como, Pharm.D., BCPS, AAHIVP []  Salome Arnt, PharmD, BCPS []  Johnnette Gourd, PharmD, BCPS []  Hughes Better, PharmD, BCPS []  Leeroy Cha, PharmD []  Laqueta Linden, PharmD, BCPS [x]  Laurence Spates, PharmD  [place] Team []  Leodis Sias, PharmD []  Lindell Spar, PharmD []  Royetta Asal, PharmD []  Graylin Shiver, Rph []  Rema Fendt) Glennon Mac, PharmD []  Arlyn Dunning, PharmD []  Netta Cedars, PharmD []  Dia Sitter, PharmD []  Leone Haven, PharmD []  Gretta Arab, PharmD []  Theodis Shove, PharmD []  Peggyann Juba, PharmD []  Reuel Boom, PharmD   Positive rine culture Treated with Ciprofloxacin , organism sensitive to the same and no further patient follow-up is required at this time.  Rosie Fate 06/23/2021, 8:23 AM

## 2021-06-24 ENCOUNTER — Telehealth: Payer: Self-pay | Admitting: Cardiology

## 2021-06-24 NOTE — Telephone Encounter (Signed)
Potential CHF for the patient

## 2021-06-24 NOTE — Telephone Encounter (Signed)
He needs to go back on his Lasix.  Would recommend doubling up his dose on day 1 and then stay on regular dose.  Hopefully the weight will go down. The renal function being uploaded in the hospital was related to his illness.  Hopefully will have improved, but we need to get rid of the fluid.  Therefore he needs to be back on Lasix.  Glenetta Hew, MD

## 2021-06-24 NOTE — Telephone Encounter (Signed)
Spoke with [name], patient is up 5 lbs since 06/18/21. She reports lung sounds are decreased in the bases, he has 1+ edema in both legs, increased abdominal girth and reports SOB with exertion. Patient was recently in the hospital and his furosemide was stopped. He has stage 3 kidney disease. Aware will forward to dr harding for directions regarding restarting furosemide.

## 2021-06-25 ENCOUNTER — Other Ambulatory Visit (HOSPITAL_COMMUNITY): Payer: Self-pay

## 2021-06-25 NOTE — Telephone Encounter (Signed)
RN  called and spoke [place] ,Therapist, sports.  Per order , instruction given to restart Lasix double the dose the first day then return to previous dose Lasix   Elane Fritz states she will contact patient 's daughter with the new instruction .  Placed lasix 20 mg as needed  back on medication list

## 2021-06-30 ENCOUNTER — Encounter
Payer: Medicare Other | Attending: Physical Medicine and Rehabilitation | Admitting: Physical Medicine and Rehabilitation

## 2021-06-30 ENCOUNTER — Other Ambulatory Visit: Payer: Self-pay

## 2021-06-30 ENCOUNTER — Encounter: Payer: Self-pay | Admitting: Physical Medicine and Rehabilitation

## 2021-06-30 VITALS — BP 159/77 | HR 58 | Temp 97.6°F | Ht 73.0 in | Wt 209.0 lb

## 2021-06-30 DIAGNOSIS — D649 Anemia, unspecified: Secondary | ICD-10-CM | POA: Insufficient documentation

## 2021-06-30 DIAGNOSIS — I6930 Unspecified sequelae of cerebral infarction: Secondary | ICD-10-CM | POA: Insufficient documentation

## 2021-06-30 DIAGNOSIS — R29898 Other symptoms and signs involving the musculoskeletal system: Secondary | ICD-10-CM | POA: Insufficient documentation

## 2021-06-30 MED ORDER — FUROSEMIDE 20 MG PO TABS: 20.0000 mg | ORAL_TABLET | ORAL | 3 refills | Status: DC | PRN

## 2021-06-30 NOTE — Patient Instructions (Addendum)
NAC (N-acetyl-cysteine) 600mg  BID   HTN: -Advised checking BP daily at home and logging results to bring into follow-up appointment with her PCP and myself. -Reviewed BP meds today.  -Advised regarding healthy foods that can help lower blood pressure and provided with a list: 1) citrus foods- high in vitamins and minerals 2) salmon and other fatty fish - reduces inflammation and oxylipins 3) swiss chard (leafy green)- high level of nitrates 4) pumpkin seeds- one of the best natural sources of magnesium 5) Beans and lentils- high in fiber, magnesium, and potassium 6) Berries- high in flavonoids 7) Amaranth (whole grain, can be cooked similarly to rice and oats)- high in magnesium and fiber 8) Pistachios- even more effective at reducing BP than other nuts 9) Carrots- high in phenolic compounds that relax blood vessels and reduce inflammation 10) Celery- contain phthalides that relax tissues of arterial walls 11) Tomatoes- can also improve cholesterol and reduce risk of heart disease 12) Broccoli- good source of magnesium, calcium, and potassium 13) Greek yogurt: high in potassium and calcium 14) Herbs and spices: Celery seed, cilantro, saffron, lemongrass, black cumin, ginseng, cinnamon, cardamom, sweet basil, and ginger 15) Chia and flax seeds- also help to lower cholesterol and blood sugar 16) Beets- high levels of nitrates that relax blood vessels  17) spinach and bananas- high in potassium  -Provided lise of supplements that can help with hypertension:  1) magnesium: one high quality brand is Bioptemizers since it contains all 7 types of magnesium, otherwise over the counter magnesium gluconate 400mg  is a good option 2) B vitamins 3) vitamin D 4) potassium 5) CoQ10 6) L-arginine 7) Vitamin C 8) Beetroot -Educated that goal BP is 120/80. -Made goal to incorporate some of the above foods into diet.    Diabetes: -check CBGs daily, log, and bring log to follow-up  appointment -avoid sugar, bread, pasta, rice -avoid snacking -perform daily foot exam and at least annual eye exam -try to incorporate into your diet some of the following foods which are good for diabetes: 1) cinnamon- imitates effects of insulin, increasing glucose transport into cells ([place] or [place] cinnamon is best, least processed) 2) nuts- can slow down the blood sugar response of carbohydrate rich foods 3) oatmeal- contains and anti-inflammatory compound avenanthramide 4) whole-milk yogurt (best types are no sugar, [place] yogurt, or goat/sheep yogurt) 5) beans- high in protein, fiber, and vitamins, low glycemic index 6) broccoli- great source of vitamin A and C 7) quinoa- higher in protein and fiber than other grains 8) spinach- high in vitamin A, fiber, and protein 9) olive oil- reduces glucose levels, LDL, and triglycerides 10) salmon- excellent amount of omega-3-fatty acids 11) walnuts- rich in antioxidants 12) apples- high in fiber and quercetin 13) carrots- highly nutritious with low impact on blood sugar 14) eggs- improve HDL (good cholesterol), high in protein, keep you satiated 15) turmeric: improves blood sugars, cardiovascular disease, and protects kidney health 16) garlic: improves blood sugar, blood pressure, pain 17) tomatoes: highly nutritious with low impact on blood sugar

## 2021-06-30 NOTE — Progress Notes (Signed)
Subjective:    Patient ID: [name], male    DOB: 1930/05/31, 88 y.o.   MRN: [id number]  HPI [name] is an 88 year old man who presents for hospital follow-up after CIR admission for lacunar infarction.  1) Lacunar infarction -doing well at home. -continues to have right sided weakness -daughter is helping him at home -he reports fatigue -he is receiving physical and occupational therapy  2) Bilateral lower extremity edema -most bothersome symptom to him currently is his lower extremity edema -his daughter has been giving him lasix 20mg  daily -she asks whether dose can be increased to 40mg  at times since edema is getting worse  Pain Inventory Average Pain 0 Pain Right Now 0 My pain is  no pain  In the last 24 hours, has pain interfered with the following? General activity 0 Relation with others 0 Enjoyment of life 0 What TIME of day is your pain at its worst? No pain Sleep (in general) NA  Pain is worse with:  no pain Pain improves with:  no pain Relief from Meds:  no pain  walk without assistance walk with assistance use a walker ability to climb steps?  yes do you drive?  no  retired I need assistance with the following:  meal prep, household duties, and shopping  weakness dizziness confusion  Any changes since last visit?  no  Any changes since last visit?  no    Family History  Problem Relation Age of Onset   Heart Problems Father 29   Social History   Socioeconomic History   Marital status: Married    Spouse name: [name]   Number of children: 1   Years of education: Not on file   Highest education level: Not on file  Occupational History    Employer: RETIRED  Tobacco Use   Smoking status: Former    Packs/day: 0.33    Years: 10.00    Pack years: 3.30    Types: Cigarettes    Quit date: 1961    Years since quitting: 62.1   Smokeless tobacco: Never  Vaping Use   Vaping Use: Never used  Substance and Sexual Activity   Alcohol  use: No   Drug use: No   Sexual activity: Never  Other Topics Concern   Not on file  Social History Narrative   Married father of one.  Previously uses stationary bike routinely.  Now reduced due to other social stressors.   Quit smoking 50 years ago.  Does not drink alcohol   Social Determinants of Health   Financial Resource Strain: Not on file  Food Insecurity: Not on file  Transportation Needs: Not on file  Physical Activity: Not on file  Stress: Not on file  Social Connections: Not on file   Past Surgical History:  Procedure Laterality Date   APPENDECTOMY     CARDIAC CATHETERIZATION  12/11/2010   Dr. Chase Picket - subsequent cath - normal LV systolic function, no renal artery stenosis, severe 2-vessel disease with subtotaled RCA prox and distal 60% lesion and complex 70% area of narrowing of ostium of LAD   CARDIAC CATHETERIZATION N/A 03/26/2016   Procedure: Left Heart Cath and Coronary Angiography;  Surgeon: Nelva Bush, MD;  Location: [facility];  Service: Cardiovascular;  Laterality: N/A;   CARDIAC CATHETERIZATION N/A 03/26/2016   Procedure: Coronary Stent Intervention;  Surgeon: Nelva Bush, MD;  Location: [facility];  Service: Cardiovascular: 100% In-stent thrombosis of pros SVG-RCA (Xience  DES) --> treated with PromusDES 3.0 x 18 (3.3 mm)   CARDIAC CATHETERIZATION N/A 03/26/2016   Procedure: Bypass Graft Angiography;  Surgeon: Nelva Bush, MD;  Location: [facility];  Service: Cardiovascular;  Laterality: N/A;   CORONARY ANGIOPLASTY WITH STENT PLACEMENT  06/18/2014   PCI to SVG-RPDA 06/18/14 (Xience [place] DES 3.0 x 18 mm -3.35 mm),    CORONARY ARTERY BYPASS GRAFT  12/15/2010   X2, Dr Servando Snare; LIMA to LAD, SVG to PDA;    CORONARY BALLOON ANGIOPLASTY N/A 04/18/2018   Procedure: CORONARY BALLOON ANGIOPLASTY;  Surgeon: Leonie Man, MD;  Location: [facility];  Service: Cardiovascular;;; high pressure scoring and noncompliant balloon  PTCA of SVG-RCA ISR ostial and proximal   CORONARY/GRAFT ANGIOGRAPHY N/A 08/27/2017   Procedure: CORONARY/GRAFT ANGIOGRAPHY;  Surgeon: Nelva Bush, MD;  Location: [facility];  Service: Cardiovascular;; pLAD 70%, ostD1 50%.  mCx 60%, OM2 80%. pRCA 95% & mRCA 100% - rPDA 70%. LIMA-mLAD patent. SVG-rPDA 10% ISR.    CYSTOSCOPY WITH URETHRAL DILATATION     IR 3D INDEPENDENT WKST  10/26/2018   IR ANGIO INTRA EXTRACRAN SEL COM CAROTID INNOMINATE BILAT MOD SED  02/25/2017   IR ANGIO INTRA EXTRACRAN SEL COM CAROTID INNOMINATE BILAT MOD SED  10/19/2017   Dr. Kathee Delton: L Common Carotid - ECA & major branches widely patent. ICA ~20% distal to bulb & 50% in supraclinoid segment. LMCA-distal 1/3 MI ~905 stenosis with post-stenotic dilation into inferior division. ~50% prox Basilar A stenosis @ anterior Inf Cerebellar A. 50% R ICA   IR ANGIO INTRA EXTRACRAN SEL COM CAROTID INNOMINATE UNI L MOD SED  06/06/2018   IR ANGIO INTRA EXTRACRAN SEL COM CAROTID INNOMINATE UNI L MOD SED  10/26/2018   IR ANGIO VERTEBRAL SEL SUBCLAVIAN INNOMINATE UNI L MOD SED  02/25/2017   IR ANGIO VERTEBRAL SEL SUBCLAVIAN INNOMINATE UNI R MOD SED  10/19/2017   IR ANGIO VERTEBRAL SEL VERTEBRAL UNI L MOD SED  10/19/2017   IR EXCHANGE BILIARY DRAIN  05/06/2021   IR EXCHANGE BILIARY DRAIN  06/17/2021   IR GENERIC HISTORICAL  01/21/2016   IR RADIOLOGIST EVAL & MGMT 01/21/2016 [facility]   IR GENERIC HISTORICAL  02/03/2016   IR CATHETER TUBE CHANGE 02/03/2016 Marybelle Killings, MD [facility]   IR RADIOLOGIST EVAL & MGMT  11/09/2017   LEFT HEART CATH AND CORONARY ANGIOGRAPHY N/A 04/18/2018   Procedure: LEFT HEART CATH AND CORONARY ANGIOGRAPHY;  Surgeon: Leonie Man, MD;  Location: [facility];  Service: Cardiovascular;  Laterality: N/A; stable findings on last cath with exception of 75% in-stent restenosis of SVG-RCA ostial stent treated with PTCA.     LEFT HEART CATHETERIZATION WITH CORONARY ANGIOGRAM N/A 06/18/2014   Procedure: LEFT  HEART CATHETERIZATION WITH CORONARY ANGIOGRAM;  Surgeon: Leonie Man, MD;  Location: [facility];  Service: Cardiovascular;  -- severe disease of SVG-rPDA   NO PAST SURGERIES     RADIOLOGY WITH ANESTHESIA N/A 01/24/2015   Procedure: STENT ASSISTED ANGIOPLASTY (RADIOLOGY WITH ANESTHESIA);  Surgeon: Luanne Bras, MD;  Location: [facility];  Service: Radiology;  Laterality: N/A;   RADIOLOGY WITH ANESTHESIA N/A 06/06/2018   Procedure: STENTING;  Surgeon: Luanne Bras, MD;  Location: [facility];  Service: Radiology;  Laterality: N/A;   RADIOLOGY WITH ANESTHESIA N/A 10/26/2018   Procedure: RADIOLOGY WITH ANESTHESIA;  Surgeon: Luanne Bras, MD;  Location: [facility];  Service: Radiology;  Laterality: N/A;   TONSILLECTOMY     TRANSTHORACIC ECHOCARDIOGRAM  08/2017; 04/2018:    A) EF 60-65%. Mild LVH. No RWMA. Gr 1 DD. Mod LA dilation.;  B)  EF 35-40%.  Severe LVH.  GRII DD.  Apical anteroseptal hypokinesis.  Akinesis of the apex.   Past Medical History:  Diagnosis Date   Abdominal aortic aneurysm    a. Aortic duplex 06/2014: mild aneurysmal dilatation of proximal abdominal aorta measuring 3.4x3.4cm. No sig change from 2012. F/u due 06/2016;    Arthritis    "right knee; never bothered me" (03/26/2016)   Balanitis xerotica obliterans    with meatal stenosis and distal stricture   Carpal tunnel syndrome of right wrist 06/24/2015   Chronic anemia 04/17/2018   Chronic combined systolic and diastolic CHF (congestive heart failure) ([place])    Coronary artery disease involving coronary bypass graft of native heart with angina pectoris ([place]) 09/19/2014   Cath for Inf STEMI 03/27/16  Conclusions: Significant native coronary artery disease, including 70% ostial LAD stenosis, 50% mid LCx lesion, and 99% proximal RCA disease with TIMI-1 flow (chronic per prior cath reports). Patent LIMA to LAD. Acutely occluded SVG to PDA within previously placed stent. 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 (03/26/2016)   Stenosis of left carotid artery    Suprapubic catheter ([facility])    Vitamin D deficiency 05/27/2018   BP (!) 159/77    Pulse (!) 58    Temp 97.6 F (36.4 C)    Ht 6\' 1"  (1.854 m)    Wt 209 lb (94.8 kg)    SpO2 94%    BMI 27.57 kg/m   Opioid Risk Score:   Fall Risk Score:  `1  Depression screen PHQ 2/9  Depression screen [facility] 2/9 06/30/2021 07/19/2018 03/21/2015  Decreased Interest 3 0 0  Down, Depressed, Hopeless 2 0 0  PHQ - 2 Score 5 0 0  Altered sleeping 3 - -  Tired, decreased energy 3 - -  Change in appetite 0 - -  Feeling bad or failure about yourself  0 - -  Trouble concentrating 2 - -  Moving slowly or fidgety/restless 0 - -  Suicidal thoughts 0 - -  PHQ-9 Score 13 - -  Some recent data might be hidden     Review of Systems  Constitutional:  Positive for unexpected weight change.       Gain  HENT: Negative.    Eyes: Negative.   Respiratory:  Positive for shortness of breath.        At night has added pillows  Cardiovascular:  Positive for leg swelling.  Gastrointestinal: Negative.   Endocrine: Negative.   Genitourinary:        Foley  Musculoskeletal:  Positive for gait problem.  Skin: Negative.   Neurological:  Positive for dizziness and weakness.  Hematological:  Bruises/bleeds easily.       Plavix  Psychiatric/Behavioral:  Positive for confusion  and dysphoric mood.    All other systems reviewed and are negative.     Objective:   Physical Exam Gen: no distress, normal appearing HEENT: oral mucosa pink and moist, NCAT Cardio: Reg rate Chest: normal effort, normal rate of breathing Abd: soft, non-distended Ext: no edema Psych: pleasant, normal affect Skin: intact Neuro: Alert and oriented, asking good questions about his health Musculoskeletal: 3/5 right sided strength, 5/5 on left       Assessment & Plan:   1) Lacunar infarction -continue therapies -would benefit from handicap placard to increase mobility in the community -reviewed all medications and provided necessary refills. -discussed vagal nerve stimulation if strength fails to improve in 6 months.  2) Bilateral lower extremity edema: -Echo reviewed and shows EF of 45-50%. Discussed this is consistent with congestive heart failure -Discussed increasing lasix to 40mg  daily prn as needed, and reducing back to 20mg  as swelling improves -Recommended elevating legs, icing, and using compression garments to help with edema.  3) Fatigue -Recommended starting NAC (N-Acetyl cysteine) 600mg  BID for fatigue. Discussed its benefits in boosting glutathione and mitochondrial function.  4) HTN: -Advised checking BP daily at home and logging results to bring into follow-up appointment with PCP or myself.  -Reviewed BP meds today.  -Advised regarding healthy foods that can help lower blood pressure and provided with a list: 1) citrus foods- high in vitamins and minerals 2) salmon and other fatty fish - reduces inflammation and oxylipins 3) swiss chard (leafy green)- high level of nitrates 4) pumpkin seeds- one of the best natural sources of magnesium 5) Beans and lentils- high in fiber, magnesium, and potassium 6) Berries- high in flavonoids 7) Amaranth (whole grain, can be cooked similarly to rice and oats)- high in magnesium and fiber 8) Pistachios- even more effective at reducing BP than  other nuts 9) Carrots- high in phenolic compounds that relax blood vessels and reduce inflammation 10) Celery- contain phthalides that relax tissues of arterial walls 11) Tomatoes- can also improve cholesterol and reduce risk of heart disease 12) Broccoli- good source of magnesium, calcium, and potassium 13) Greek yogurt: high in potassium and calcium 14) Herbs and spices: Celery seed, cilantro, saffron, lemongrass, black cumin, ginseng, cinnamon, cardamom, sweet basil, and ginger 15) Chia and flax seeds- also help to lower cholesterol and blood sugar 16) Beets- high levels of nitrates that relax blood vessels  17) spinach and bananas- high in potassium  -Provided lise of supplements that can help with hypertension:  1) magnesium: one high quality brand is Bioptemizers since it contains all 7 types of magnesium, otherwise over the counter magnesium gluconate 400mg  is a good option 2) B vitamins 3) vitamin D 4) potassium 5) CoQ10 6) L-arginine 7) Vitamin C 8) Beetroot -Educated that goal BP is 120/80. -Made goal to incorporate some of the above foods into diet.

## 2021-07-16 ENCOUNTER — Encounter (HOSPITAL_COMMUNITY): Payer: Self-pay | Admitting: Emergency Medicine

## 2021-07-16 ENCOUNTER — Inpatient Hospital Stay (HOSPITAL_COMMUNITY)
Admission: EM | Admit: 2021-07-16 | Discharge: 2021-07-21 | DRG: 871 | Disposition: A | Discharge status: Hospice | Payer: Medicare Other | Attending: Family Medicine | Admitting: Family Medicine

## 2021-07-16 DIAGNOSIS — J189 Pneumonia, unspecified organism: Secondary | ICD-10-CM

## 2021-07-16 DIAGNOSIS — Y95 Nosocomial condition: Secondary | ICD-10-CM | POA: Diagnosis present

## 2021-07-16 DIAGNOSIS — Z531 Procedure and treatment not carried out because of patient's decision for reasons of belief and group pressure: Secondary | ICD-10-CM | POA: Diagnosis present

## 2021-07-16 DIAGNOSIS — I6340 Cerebral infarction due to embolism of unspecified cerebral artery: Secondary | ICD-10-CM | POA: Insufficient documentation

## 2021-07-16 DIAGNOSIS — Z7902 Long term (current) use of antithrombotics/antiplatelets: Secondary | ICD-10-CM

## 2021-07-16 DIAGNOSIS — Z8616 Personal history of COVID-19: Secondary | ICD-10-CM

## 2021-07-16 DIAGNOSIS — E1169 Type 2 diabetes mellitus with other specified complication: Secondary | ICD-10-CM | POA: Diagnosis present

## 2021-07-16 DIAGNOSIS — E785 Hyperlipidemia, unspecified: Secondary | ICD-10-CM | POA: Diagnosis present

## 2021-07-16 DIAGNOSIS — I252 Old myocardial infarction: Secondary | ICD-10-CM

## 2021-07-16 DIAGNOSIS — I25119 Atherosclerotic heart disease of native coronary artery with unspecified angina pectoris: Secondary | ICD-10-CM | POA: Diagnosis present

## 2021-07-16 DIAGNOSIS — J9601 Acute respiratory failure with hypoxia: Secondary | ICD-10-CM | POA: Diagnosis present

## 2021-07-16 DIAGNOSIS — A419 Sepsis, unspecified organism: Secondary | ICD-10-CM

## 2021-07-16 DIAGNOSIS — I2510 Atherosclerotic heart disease of native coronary artery without angina pectoris: Secondary | ICD-10-CM | POA: Diagnosis present

## 2021-07-16 DIAGNOSIS — N184 Chronic kidney disease, stage 4 (severe): Secondary | ICD-10-CM | POA: Diagnosis present

## 2021-07-16 DIAGNOSIS — I25709 Atherosclerosis of coronary artery bypass graft(s), unspecified, with unspecified angina pectoris: Secondary | ICD-10-CM | POA: Diagnosis present

## 2021-07-16 DIAGNOSIS — Z6828 Body mass index (BMI) 28.0-28.9, adult: Secondary | ICD-10-CM

## 2021-07-16 DIAGNOSIS — G8191 Hemiplegia, unspecified affecting right dominant side: Secondary | ICD-10-CM | POA: Diagnosis not present

## 2021-07-16 DIAGNOSIS — R54 Age-related physical debility: Secondary | ICD-10-CM | POA: Diagnosis present

## 2021-07-16 DIAGNOSIS — R29707 NIHSS score 7: Secondary | ICD-10-CM | POA: Diagnosis not present

## 2021-07-16 DIAGNOSIS — I10 Essential (primary) hypertension: Secondary | ICD-10-CM

## 2021-07-16 DIAGNOSIS — Z7984 Long term (current) use of oral hypoglycemic drugs: Secondary | ICD-10-CM

## 2021-07-16 DIAGNOSIS — Z66 Do not resuscitate: Secondary | ICD-10-CM | POA: Diagnosis present

## 2021-07-16 DIAGNOSIS — I714 Abdominal aortic aneurysm, without rupture, unspecified: Secondary | ICD-10-CM | POA: Diagnosis present

## 2021-07-16 DIAGNOSIS — Z79899 Other long term (current) drug therapy: Secondary | ICD-10-CM

## 2021-07-16 DIAGNOSIS — T85520A Displacement of bile duct prosthesis, initial encounter: Secondary | ICD-10-CM

## 2021-07-16 DIAGNOSIS — D631 Anemia in chronic kidney disease: Secondary | ICD-10-CM | POA: Diagnosis present

## 2021-07-16 DIAGNOSIS — Z955 Presence of coronary angioplasty implant and graft: Secondary | ICD-10-CM

## 2021-07-16 DIAGNOSIS — I5043 Acute on chronic combined systolic (congestive) and diastolic (congestive) heart failure: Secondary | ICD-10-CM | POA: Diagnosis present

## 2021-07-16 DIAGNOSIS — I5023 Acute on chronic systolic (congestive) heart failure: Secondary | ICD-10-CM | POA: Diagnosis present

## 2021-07-16 DIAGNOSIS — I130 Hypertensive heart and chronic kidney disease with heart failure and stage 1 through stage 4 chronic kidney disease, or unspecified chronic kidney disease: Secondary | ICD-10-CM | POA: Diagnosis present

## 2021-07-16 DIAGNOSIS — Z515 Encounter for palliative care: Secondary | ICD-10-CM

## 2021-07-16 DIAGNOSIS — I63412 Cerebral infarction due to embolism of left middle cerebral artery: Secondary | ICD-10-CM | POA: Diagnosis not present

## 2021-07-16 DIAGNOSIS — E669 Obesity, unspecified: Secondary | ICD-10-CM | POA: Diagnosis present

## 2021-07-16 NOTE — ED Triage Notes (Signed)
Pt here from home with c/o bilary drain out , family said it got pulled out while transferring from w/c to commode  ?

## 2021-07-17 ENCOUNTER — Other Ambulatory Visit: Payer: Self-pay

## 2021-07-17 ENCOUNTER — Emergency Department (HOSPITAL_COMMUNITY): Payer: Medicare Other

## 2021-07-17 DIAGNOSIS — Z7189 Other specified counseling: Secondary | ICD-10-CM | POA: Diagnosis not present

## 2021-07-17 DIAGNOSIS — A419 Sepsis, unspecified organism: Secondary | ICD-10-CM | POA: Diagnosis present

## 2021-07-17 DIAGNOSIS — Z531 Procedure and treatment not carried out because of patient's decision for reasons of belief and group pressure: Secondary | ICD-10-CM | POA: Diagnosis present

## 2021-07-17 DIAGNOSIS — R54 Age-related physical debility: Secondary | ICD-10-CM | POA: Diagnosis present

## 2021-07-17 DIAGNOSIS — J189 Pneumonia, unspecified organism: Secondary | ICD-10-CM | POA: Diagnosis not present

## 2021-07-17 DIAGNOSIS — I130 Hypertensive heart and chronic kidney disease with heart failure and stage 1 through stage 4 chronic kidney disease, or unspecified chronic kidney disease: Secondary | ICD-10-CM | POA: Diagnosis present

## 2021-07-17 DIAGNOSIS — I639 Cerebral infarction, unspecified: Secondary | ICD-10-CM | POA: Diagnosis not present

## 2021-07-17 DIAGNOSIS — Y95 Nosocomial condition: Secondary | ICD-10-CM | POA: Diagnosis present

## 2021-07-17 DIAGNOSIS — Z6828 Body mass index (BMI) 28.0-28.9, adult: Secondary | ICD-10-CM | POA: Diagnosis not present

## 2021-07-17 DIAGNOSIS — I714 Abdominal aortic aneurysm, without rupture, unspecified: Secondary | ICD-10-CM | POA: Diagnosis present

## 2021-07-17 DIAGNOSIS — Z66 Do not resuscitate: Secondary | ICD-10-CM | POA: Diagnosis present

## 2021-07-17 DIAGNOSIS — I509 Heart failure, unspecified: Secondary | ICD-10-CM | POA: Insufficient documentation

## 2021-07-17 DIAGNOSIS — I25709 Atherosclerosis of coronary artery bypass graft(s), unspecified, with unspecified angina pectoris: Secondary | ICD-10-CM

## 2021-07-17 DIAGNOSIS — Z79899 Other long term (current) drug therapy: Secondary | ICD-10-CM | POA: Diagnosis not present

## 2021-07-17 DIAGNOSIS — Z7984 Long term (current) use of oral hypoglycemic drugs: Secondary | ICD-10-CM | POA: Diagnosis not present

## 2021-07-17 DIAGNOSIS — R29707 NIHSS score 7: Secondary | ICD-10-CM | POA: Diagnosis not present

## 2021-07-17 DIAGNOSIS — N189 Chronic kidney disease, unspecified: Secondary | ICD-10-CM | POA: Diagnosis not present

## 2021-07-17 DIAGNOSIS — I5043 Acute on chronic combined systolic (congestive) and diastolic (congestive) heart failure: Secondary | ICD-10-CM | POA: Diagnosis present

## 2021-07-17 DIAGNOSIS — I2510 Atherosclerotic heart disease of native coronary artery without angina pectoris: Secondary | ICD-10-CM | POA: Diagnosis present

## 2021-07-17 DIAGNOSIS — G8191 Hemiplegia, unspecified affecting right dominant side: Secondary | ICD-10-CM | POA: Diagnosis not present

## 2021-07-17 DIAGNOSIS — J9601 Acute respiratory failure with hypoxia: Secondary | ICD-10-CM | POA: Diagnosis present

## 2021-07-17 DIAGNOSIS — I6340 Cerebral infarction due to embolism of unspecified cerebral artery: Secondary | ICD-10-CM | POA: Diagnosis not present

## 2021-07-17 DIAGNOSIS — N184 Chronic kidney disease, stage 4 (severe): Secondary | ICD-10-CM | POA: Diagnosis present

## 2021-07-17 DIAGNOSIS — E785 Hyperlipidemia, unspecified: Secondary | ICD-10-CM | POA: Diagnosis present

## 2021-07-17 DIAGNOSIS — E669 Obesity, unspecified: Secondary | ICD-10-CM | POA: Diagnosis present

## 2021-07-17 DIAGNOSIS — T85520A Displacement of bile duct prosthesis, initial encounter: Secondary | ICD-10-CM | POA: Diagnosis not present

## 2021-07-17 DIAGNOSIS — I5023 Acute on chronic systolic (congestive) heart failure: Secondary | ICD-10-CM | POA: Diagnosis not present

## 2021-07-17 DIAGNOSIS — Z8616 Personal history of COVID-19: Secondary | ICD-10-CM | POA: Diagnosis not present

## 2021-07-17 DIAGNOSIS — Z515 Encounter for palliative care: Secondary | ICD-10-CM | POA: Diagnosis not present

## 2021-07-17 DIAGNOSIS — I252 Old myocardial infarction: Secondary | ICD-10-CM | POA: Diagnosis not present

## 2021-07-17 DIAGNOSIS — D631 Anemia in chronic kidney disease: Secondary | ICD-10-CM | POA: Diagnosis present

## 2021-07-17 DIAGNOSIS — Z7902 Long term (current) use of antithrombotics/antiplatelets: Secondary | ICD-10-CM | POA: Diagnosis not present

## 2021-07-17 DIAGNOSIS — I63412 Cerebral infarction due to embolism of left middle cerebral artery: Secondary | ICD-10-CM | POA: Diagnosis not present

## 2021-07-17 DIAGNOSIS — Z955 Presence of coronary angioplasty implant and graft: Secondary | ICD-10-CM | POA: Diagnosis not present

## 2021-07-17 LAB — COMPREHENSIVE METABOLIC PANEL
ALT: 10 U/L (ref 0–44)
ALT: 10 U/L (ref 0–44)
AST: 16 U/L (ref 15–41)
AST: 18 U/L (ref 15–41)
Albumin: 3.7 g/dL (ref 3.5–5.0)
Albumin: 3.1 g/dL — ABNORMAL LOW (ref 3.5–5.0)
Alkaline Phosphatase: 61 U/L (ref 38–126)
Alkaline Phosphatase: 52 U/L (ref 38–126)
Anion gap: 12 (ref 5–15)
Anion gap: 11 (ref 5–15)
BUN: 31 mg/dL — ABNORMAL HIGH (ref 8–23)
BUN: 31 mg/dL — ABNORMAL HIGH (ref 8–23)
CO2: 24 mmol/L (ref 22–32)
CO2: 26 mmol/L (ref 22–32)
Calcium: 8.7 mg/dL — ABNORMAL LOW (ref 8.9–10.3)
Calcium: 8.3 mg/dL — ABNORMAL LOW (ref 8.9–10.3)
Chloride: 99 mmol/L (ref 98–111)
Chloride: 99 mmol/L (ref 98–111)
Creatinine, Ser: 2.46 mg/dL — ABNORMAL HIGH (ref 0.61–1.24)
Creatinine, Ser: 2.47 mg/dL — ABNORMAL HIGH (ref 0.61–1.24)
GFR, Estimated: 25 mL/min — ABNORMAL LOW (ref >60)
GFR, Estimated: 24 mL/min — ABNORMAL LOW (ref >60)
Glucose, Bld: 193 mg/dL — ABNORMAL HIGH (ref 70–99)
Glucose, Bld: 144 mg/dL — ABNORMAL HIGH (ref 70–99)
Potassium: 3.8 mmol/L (ref 3.5–5.1)
Potassium: 3.8 mmol/L (ref 3.5–5.1)
Sodium: 135 mmol/L (ref 135–145)
Sodium: 136 mmol/L (ref 135–145)
Total Bilirubin: 2.6 mg/dL — ABNORMAL HIGH (ref 0.3–1.2)
Total Bilirubin: 1.1 mg/dL (ref 0.3–1.2)
Total Protein: 6.6 g/dL (ref 6.5–8.1)
Total Protein: 5.6 g/dL — ABNORMAL LOW (ref 6.5–8.1)

## 2021-07-17 LAB — TROPONIN I (HIGH SENSITIVITY)
Troponin I (High Sensitivity): 22 ng/L — ABNORMAL HIGH (ref <18)
Troponin I (High Sensitivity): 33 ng/L — ABNORMAL HIGH (ref <18)

## 2021-07-17 LAB — CBC WITH DIFFERENTIAL/PLATELET
Abs Immature Granulocytes: 0.1 10*3/uL — ABNORMAL HIGH (ref 0.00–0.07)
Basophils Absolute: 0 10*3/uL (ref 0.0–0.1)
Basophils Relative: 0 %
Eosinophils Absolute: 0 10*3/uL (ref 0.0–0.5)
Eosinophils Relative: 0 %
HCT: 18.8 % — ABNORMAL LOW (ref 39.0–52.0)
Hemoglobin: 6.4 g/dL — CL (ref 13.0–17.0)
Lymphocytes Relative: 15 %
Lymphs Abs: 2 10*3/uL (ref 0.7–4.0)
MCH: 32.2 pg (ref 26.0–34.0)
MCHC: 34 g/dL (ref 30.0–36.0)
MCV: 94.5 fL (ref 80.0–100.0)
Monocytes Absolute: 1.5 10*3/uL — ABNORMAL HIGH (ref 0.1–1.0)
Monocytes Relative: 11 %
Myelocytes: 1 %
Neutro Abs: 9.7 10*3/uL — ABNORMAL HIGH (ref 1.7–7.7)
Neutrophils Relative %: 73 %
Platelets: 372 10*3/uL (ref 150–400)
RBC: 1.99 MIL/uL — ABNORMAL LOW (ref 4.22–5.81)
RDW: 25.2 % — ABNORMAL HIGH (ref 11.5–15.5)
WBC: 13.3 10*3/uL — ABNORMAL HIGH (ref 4.0–10.5)
nRBC: 1.1 % — ABNORMAL HIGH (ref 0.0–0.2)
nRBC: 4 /100 WBC — ABNORMAL HIGH

## 2021-07-17 LAB — URINALYSIS, ROUTINE W REFLEX MICROSCOPIC
Bilirubin Urine: NEGATIVE
Glucose, UA: NEGATIVE mg/dL
Hgb urine dipstick: NEGATIVE
Ketones, ur: NEGATIVE mg/dL
Nitrite: POSITIVE — AB
Protein, ur: 100 mg/dL — AB
Specific Gravity, Urine: 1.008 (ref 1.005–1.030)
pH: 7 (ref 5.0–8.0)

## 2021-07-17 LAB — HEMOGLOBIN A1C
Hgb A1c MFr Bld: 6.9 % — ABNORMAL HIGH (ref 4.8–5.6)
Mean Plasma Glucose: 151.33 mg/dL

## 2021-07-17 LAB — CBC
HCT: 15.9 % — ABNORMAL LOW (ref 39.0–52.0)
Hemoglobin: 5.2 g/dL — CL (ref 13.0–17.0)
MCH: 31.1 pg (ref 26.0–34.0)
MCHC: 32.7 g/dL (ref 30.0–36.0)
MCV: 95.2 fL (ref 80.0–100.0)
Platelets: 283 10*3/uL (ref 150–400)
RBC: 1.67 MIL/uL — ABNORMAL LOW (ref 4.22–5.81)
RDW: 24.7 % — ABNORMAL HIGH (ref 11.5–15.5)
WBC: 17.1 10*3/uL — ABNORMAL HIGH (ref 4.0–10.5)
nRBC: 0.8 % — ABNORMAL HIGH (ref 0.0–0.2)

## 2021-07-17 LAB — CBG MONITORING, ED
Glucose-Capillary: 125 mg/dL — ABNORMAL HIGH (ref 70–99)
Glucose-Capillary: 139 mg/dL — ABNORMAL HIGH (ref 70–99)

## 2021-07-17 LAB — PROCALCITONIN: Procalcitonin: 3.53 ng/mL

## 2021-07-17 LAB — RESP PANEL BY RT-PCR (FLU A&B, COVID) ARPGX2
Influenza A by PCR: NEGATIVE
Influenza B by PCR: NEGATIVE
SARS Coronavirus 2 by RT PCR: POSITIVE — AB

## 2021-07-17 LAB — BRAIN NATRIURETIC PEPTIDE: B Natriuretic Peptide: 988.9 pg/mL — ABNORMAL HIGH (ref 0.0–100.0)

## 2021-07-17 LAB — D-DIMER, QUANTITATIVE: D-Dimer, Quant: 1.55 ug/mL-FEU — ABNORMAL HIGH (ref 0.00–0.50)

## 2021-07-17 LAB — LACTIC ACID, PLASMA
Lactic Acid, Venous: 1.5 mmol/L (ref 0.5–1.9)
Lactic Acid, Venous: 1.2 mmol/L (ref 0.5–1.9)

## 2021-07-17 LAB — STREP PNEUMONIAE URINARY ANTIGEN
Strep Pneumo Urinary Antigen: NEGATIVE
Strep Pneumo Urinary Antigen: NEGATIVE

## 2021-07-17 LAB — GLUCOSE, CAPILLARY
Glucose-Capillary: 111 mg/dL — ABNORMAL HIGH (ref 70–99)
Glucose-Capillary: 168 mg/dL — ABNORMAL HIGH (ref 70–99)

## 2021-07-17 LAB — C-REACTIVE PROTEIN: CRP: 6.8 mg/dL — ABNORMAL HIGH (ref <1.0)

## 2021-07-17 MED ORDER — SODIUM CHLORIDE 0.9 % IV SOLN: 250.0000 mL | INTRAVENOUS | Status: DC | PRN

## 2021-07-17 MED ORDER — ONDANSETRON HCL 4 MG/2ML IJ SOLN: 4.0000 mg | Freq: Four times a day (QID) | INTRAMUSCULAR | Status: DC | PRN

## 2021-07-17 MED ORDER — MAGNESIUM HYDROXIDE 400 MG/5ML PO SUSP: 30.0000 mL | Freq: Every day | ORAL | Status: DC | PRN

## 2021-07-17 MED ORDER — INSULIN ASPART 100 UNIT/ML IJ SOLN
0.0000 [IU] | Freq: Three times a day (TID) | INTRAMUSCULAR | Status: DC
  Administered 2021-07-17: 1 [IU] via SUBCUTANEOUS
  Administered 2021-07-17: 2 [IU] via SUBCUTANEOUS
  Administered 2021-07-17: 1 [IU] via SUBCUTANEOUS
  Administered 2021-07-18 (×3): 2 [IU] via SUBCUTANEOUS
  Administered 2021-07-19: 3 [IU] via SUBCUTANEOUS
  Administered 2021-07-19: 1 [IU] via SUBCUTANEOUS

## 2021-07-17 MED ORDER — VITAMIN B-12 1000 MCG PO TABS
1000.0000 ug | ORAL_TABLET | Freq: Every day | ORAL | Status: DC
  Administered 2021-07-17 – 2021-07-19 (×3): 1000 ug via ORAL
  Filled 2021-07-17 (×3): qty 1

## 2021-07-17 MED ORDER — SODIUM CHLORIDE FLUSH 0.9 % IV SOLN
5.0000 mL | Freq: Two times a day (BID) | INTRAVENOUS | Status: DC
  Administered 2021-07-17 – 2021-07-19 (×3): 5 mL
  Filled 2021-07-17 (×6): qty 5

## 2021-07-17 MED ORDER — SODIUM CHLORIDE 0.9 % IV SOLN: 2.0000 g | INTRAVENOUS | Status: DC

## 2021-07-17 MED ORDER — MORPHINE SULFATE (PF) 2 MG/ML IV SOLN: 2.0000 mg | INTRAVENOUS | Status: DC | PRN

## 2021-07-17 MED ORDER — IPRATROPIUM-ALBUTEROL 0.5-2.5 (3) MG/3ML IN SOLN
3.0000 mL | Freq: Four times a day (QID) | RESPIRATORY_TRACT | Status: DC
  Administered 2021-07-17 – 2021-07-18 (×6): 3 mL via RESPIRATORY_TRACT
  Filled 2021-07-17 (×6): qty 3

## 2021-07-17 MED ORDER — SODIUM CHLORIDE 0.9% FLUSH: 3.0000 mL | INTRAVENOUS | Status: DC | PRN

## 2021-07-17 MED ORDER — ZINC SULFATE 220 (50 ZN) MG PO CAPS
220.0000 mg | ORAL_CAPSULE | Freq: Every day | ORAL | Status: DC
  Administered 2021-07-17 – 2021-07-19 (×3): 220 mg via ORAL
  Filled 2021-07-17 (×3): qty 1

## 2021-07-17 MED ORDER — SODIUM CHLORIDE 0.9 % IV SOLN
2.0000 g | INTRAVENOUS | Status: DC
  Administered 2021-07-17 – 2021-07-18 (×2): 2 g via INTRAVENOUS
  Filled 2021-07-17 (×2): qty 2

## 2021-07-17 MED ORDER — GUAIFENESIN ER 600 MG PO TB12
600.0000 mg | ORAL_TABLET | Freq: Two times a day (BID) | ORAL | Status: DC
  Administered 2021-07-17 – 2021-07-19 (×5): 600 mg via ORAL
  Filled 2021-07-17 (×5): qty 1

## 2021-07-17 MED ORDER — AMLODIPINE BESYLATE 10 MG PO TABS
10.0000 mg | ORAL_TABLET | Freq: Every day | ORAL | Status: DC
  Administered 2021-07-17 – 2021-07-19 (×3): 10 mg via ORAL
  Filled 2021-07-17 (×3): qty 1

## 2021-07-17 MED ORDER — SODIUM CHLORIDE 0.9 % IV SOLN
2.0000 g | Freq: Once | INTRAVENOUS | Status: AC
  Administered 2021-07-17: 2 g via INTRAVENOUS
  Filled 2021-07-17: qty 20

## 2021-07-17 MED ORDER — SODIUM CHLORIDE 0.9% FLUSH
3.0000 mL | Freq: Two times a day (BID) | INTRAVENOUS | Status: DC
  Administered 2021-07-17 – 2021-07-18 (×3): 3 mL via INTRAVENOUS

## 2021-07-17 MED ORDER — TRAZODONE HCL 50 MG PO TABS: 25.0000 mg | ORAL_TABLET | Freq: Every evening | ORAL | Status: DC | PRN

## 2021-07-17 MED ORDER — ACETAMINOPHEN 650 MG RE SUPP: 650.0000 mg | Freq: Four times a day (QID) | RECTAL | Status: DC | PRN

## 2021-07-17 MED ORDER — PANTOPRAZOLE SODIUM 40 MG PO TBEC
40.0000 mg | DELAYED_RELEASE_TABLET | Freq: Every day | ORAL | Status: DC
  Administered 2021-07-17 – 2021-07-19 (×3): 40 mg via ORAL
  Filled 2021-07-17 (×3): qty 1

## 2021-07-17 MED ORDER — DOXAZOSIN MESYLATE 8 MG PO TABS
4.0000 mg | ORAL_TABLET | Freq: Every day | ORAL | Status: DC
  Administered 2021-07-17 – 2021-07-19 (×3): 4 mg via ORAL
  Filled 2021-07-17 (×4): qty 1

## 2021-07-17 MED ORDER — FUROSEMIDE 10 MG/ML IJ SOLN
40.0000 mg | Freq: Two times a day (BID) | INTRAMUSCULAR | Status: DC
  Administered 2021-07-17 – 2021-07-21 (×9): 40 mg via INTRAVENOUS
  Filled 2021-07-17 (×10): qty 4

## 2021-07-17 MED ORDER — HYDRALAZINE HCL 50 MG PO TABS
100.0000 mg | ORAL_TABLET | Freq: Two times a day (BID) | ORAL | Status: DC
  Administered 2021-07-17 – 2021-07-19 (×5): 100 mg via ORAL
  Filled 2021-07-17 (×5): qty 2

## 2021-07-17 MED ORDER — ACETAMINOPHEN 325 MG PO TABS: 650.0000 mg | ORAL_TABLET | Freq: Four times a day (QID) | ORAL | Status: DC | PRN

## 2021-07-17 MED ORDER — ALBUTEROL SULFATE (2.5 MG/3ML) 0.083% IN NEBU: 3.0000 mL | INHALATION_SOLUTION | RESPIRATORY_TRACT | Status: DC | PRN

## 2021-07-17 MED ORDER — NITROGLYCERIN 0.4 MG SL SUBL: 0.4000 mg | SUBLINGUAL_TABLET | SUBLINGUAL | Status: DC | PRN

## 2021-07-17 MED ORDER — FOLIC ACID 1 MG PO TABS
1.0000 mg | ORAL_TABLET | Freq: Every day | ORAL | Status: DC
Start: 2021-07-17 — End: 2021-07-19
  Administered 2021-07-17 – 2021-07-19 (×3): 1 mg via ORAL
  Filled 2021-07-17 (×3): qty 1

## 2021-07-17 MED ORDER — ISOSORBIDE MONONITRATE ER 60 MG PO TB24
60.0000 mg | ORAL_TABLET | Freq: Two times a day (BID) | ORAL | Status: DC
  Administered 2021-07-17 – 2021-07-19 (×5): 60 mg via ORAL
  Filled 2021-07-17 (×2): qty 1
  Filled 2021-07-17: qty 2
  Filled 2021-07-17 (×2): qty 1

## 2021-07-17 MED ORDER — SACCHAROMYCES BOULARDII 250 MG PO CAPS
250.0000 mg | ORAL_CAPSULE | Freq: Two times a day (BID) | ORAL | Status: DC
  Administered 2021-07-17 – 2021-07-19 (×4): 250 mg via ORAL
  Filled 2021-07-17 (×5): qty 1

## 2021-07-17 MED ORDER — ASCORBIC ACID 500 MG PO TABS
500.0000 mg | ORAL_TABLET | Freq: Every day | ORAL | Status: DC
Start: 2021-07-17 — End: 2021-07-19
  Administered 2021-07-17 – 2021-07-19 (×3): 500 mg via ORAL
  Filled 2021-07-17 (×3): qty 1

## 2021-07-17 MED ORDER — CLOPIDOGREL BISULFATE 75 MG PO TABS
75.0000 mg | ORAL_TABLET | Freq: Every day | ORAL | Status: DC
  Administered 2021-07-17 – 2021-07-19 (×3): 75 mg via ORAL
  Filled 2021-07-17 (×3): qty 1

## 2021-07-17 MED ORDER — LACTATED RINGERS IV SOLN: INTRAVENOUS | Status: DC

## 2021-07-17 MED ORDER — SODIUM CHLORIDE 0.9 % IV SOLN
500.0000 mg | INTRAVENOUS | Status: DC
  Administered 2021-07-18 – 2021-07-19 (×2): 500 mg via INTRAVENOUS
  Filled 2021-07-17 (×3): qty 5

## 2021-07-17 MED ORDER — ACETAMINOPHEN 325 MG PO TABS
650.0000 mg | ORAL_TABLET | Freq: Four times a day (QID) | ORAL | Status: DC | PRN
  Administered 2021-07-17: 650 mg via ORAL
  Filled 2021-07-17 (×2): qty 2

## 2021-07-17 MED ORDER — SODIUM CHLORIDE 0.9 % IV SOLN: 500.0000 mg | INTRAVENOUS | Status: DC

## 2021-07-17 MED ORDER — OXYBUTYNIN CHLORIDE ER 10 MG PO TB24
10.0000 mg | ORAL_TABLET | Freq: Every day | ORAL | Status: DC
  Administered 2021-07-18 – 2021-07-19 (×2): 10 mg via ORAL
  Filled 2021-07-17 (×2): qty 1

## 2021-07-17 MED ORDER — CARVEDILOL 6.25 MG PO TABS
6.2500 mg | ORAL_TABLET | Freq: Two times a day (BID) | ORAL | Status: DC
  Administered 2021-07-17 – 2021-07-19 (×5): 6.25 mg via ORAL
  Filled 2021-07-17 (×5): qty 1
  Filled 2021-07-17: qty 2

## 2021-07-17 MED ORDER — FERROUS SULFATE 325 (65 FE) MG PO TABS
325.0000 mg | ORAL_TABLET | Freq: Every day | ORAL | Status: DC
  Administered 2021-07-17 – 2021-07-19 (×3): 325 mg via ORAL
  Filled 2021-07-17 (×3): qty 1

## 2021-07-17 MED ORDER — SODIUM BICARBONATE 650 MG PO TABS
650.0000 mg | ORAL_TABLET | Freq: Two times a day (BID) | ORAL | Status: DC
Start: 2021-07-17 — End: 2021-07-19
  Administered 2021-07-17 – 2021-07-19 (×5): 650 mg via ORAL
  Filled 2021-07-17 (×5): qty 1

## 2021-07-17 MED ORDER — SODIUM CHLORIDE 0.9 % IV SOLN
500.0000 mg | INTRAVENOUS | Status: DC
  Administered 2021-07-17: 500 mg via INTRAVENOUS
  Filled 2021-07-17: qty 5

## 2021-07-17 MED ORDER — ENOXAPARIN SODIUM 30 MG/0.3ML IJ SOSY
30.0000 mg | PREFILLED_SYRINGE | INTRAMUSCULAR | Status: DC
  Filled 2021-07-17: qty 0.3

## 2021-07-17 MED ORDER — ENOXAPARIN SODIUM 40 MG/0.4ML IJ SOSY
40.0000 mg | PREFILLED_SYRINGE | INTRAMUSCULAR | Status: DC
  Filled 2021-07-17: qty 0.4

## 2021-07-17 MED ORDER — ONDANSETRON HCL 4 MG PO TABS: 4.0000 mg | ORAL_TABLET | Freq: Four times a day (QID) | ORAL | Status: DC | PRN

## 2021-07-17 NOTE — ED Notes (Signed)
Contacted inpt RN via phone to follow up on secure chat. Inpt RN unable to receive report at this time.  ?

## 2021-07-17 NOTE — Assessment & Plan Note (Signed)
-   We will continue Imdur, as needed sublingual nitroglycerin, Coreg and statin therapy. ?

## 2021-07-17 NOTE — Sepsis Progress Note (Signed)
Following per sepsis protocol   

## 2021-07-17 NOTE — ED Notes (Signed)
Contact inpt RN via secure chat to initiate transfer to floor ?

## 2021-07-17 NOTE — Assessment & Plan Note (Signed)
-   The patient will be placed on supplement coverage with NovoLog. - We will hold off metformin. 

## 2021-07-17 NOTE — Assessment & Plan Note (Signed)
-   The patient be admitted to a cardiac telemetry bed. ?- He will be diuresed with IV Lasix. ?- We will follow serial troponins. ?- We will continue Coreg and Imdur. ?- Last echo in December 2022 revealed an EF of 45 to 50%. ?

## 2021-07-17 NOTE — ED Notes (Signed)
Report given to Charge RN on 3E ?

## 2021-07-17 NOTE — Assessment & Plan Note (Signed)
-   The patient will be placed on IV Rocephin and Zithromax. ?- Mucolytic therapy and bronchodilator therapy will be given. ?- We will follow blood cultures as well as sputum culture and sensitivity. ?- We will obtain pneumonia antigens. ?

## 2021-07-17 NOTE — ED Provider Notes (Signed)
[facility] EMERGENCY DEPARTMENT Provider Note   CSN: [id number] Arrival date & time: 07/16/21  1716     History  No chief complaint on file.   [name] is a 88 y.o. male.  88 year old male who presents to the emerged from today secondary to dislodged biliary drain.  He is with his daughter who helps supply the history.  It seems like back in October he had cholecystitis.  He is not a good surgical candidate secondary to multiple medical problems, age and anemia so elected to place a biliary drain by IR and do antibiotics he has been stable since that time.  Sounds like couple weeks ago he had a fall and since that time he had progressively worsening weakness and today he sat down in a chair very forcefully and dislodged biliary drain at that time.  Daughter states he does have worsening cough in the last couple weeks.  She has noticed any fever but the patient states he does feel cold and feels chills.  No nausea, vomiting, diarrhea or constipation.  He has a suprapubic catheter in place and has been draining normally without any obvious abnormalities.  Review the records verifies a lot of this information       Home Medications Prior to Admission medications   Medication Sig Start Date End Date Taking? Authorizing Provider  acetaminophen (TYLENOL) 325 MG tablet Take 2 tablets (650 mg total) by mouth every 6 (six) hours as needed for mild pain (or Fever >/= 101). Patient taking differently: Take 325 mg by mouth every 6 (six) hours as needed for mild pain or headache (or Fever >/= 101). 05/31/18  Yes Angiulli, Lavon Paganini, PA-C  albuterol (VENTOLIN HFA) 108 (90 Base) MCG/ACT inhaler Inhale 2 puffs into the lungs every 2 (two) hours as needed for wheezing or shortness of breath. 05/27/21  Yes Angiulli, Lavon Paganini, PA-C  amLODipine (NORVASC) 10 MG tablet Take 1 tablet (10 mg total) by mouth daily in the afternoon. 05/27/21  Yes Angiulli, Lavon Paganini, PA-C  ascorbic acid (VITAMIN  C) 500 MG tablet Take 1 tablet (500 mg total) by mouth daily. Patient taking differently: Take 500 mg by mouth daily at 2 PM. 05/27/21  Yes Angiulli, Lavon Paganini, PA-C  carvedilol (COREG) 6.25 MG tablet Take 1 tablet (6.25 mg total) by mouth 2 (two) times daily with a meal. 05/27/21  Yes Angiulli, Lavon Paganini, PA-C  clopidogrel (PLAVIX) 75 MG tablet Take 1 tablet (75 mg total) by mouth daily. Patient taking differently: Take 75 mg by mouth daily at 2 PM. 05/27/21  Yes Angiulli, Lavon Paganini, PA-C  cyanocobalamin 1000 MCG tablet Take 1 tablet (1,000 mcg total) by mouth daily. Patient taking differently: Take 1,000 mcg by mouth daily at 2 PM. 05/27/21  Yes Angiulli, Lavon Paganini, PA-C  doxazosin (CARDURA) 4 MG tablet Take 1 tablet (4 mg total) by mouth daily. Patient taking differently: Take 4 mg by mouth daily at 2 PM. 05/27/21  Yes Angiulli, Lavon Paganini, PA-C  ferrous sulfate 325 (65 FE) MG tablet Take 1 tablet (325 mg total) by mouth daily. Patient taking differently: Take 325 mg by mouth daily at 2 PM. 05/27/21  Yes Angiulli, Lavon Paganini, PA-C  folic acid (FOLVITE) 423 MCG tablet Take 400 mcg by mouth daily at 2 PM.   Yes [provider]  furosemide (LASIX) 20 MG tablet Take 1 tablet (20 mg total) by mouth as needed. Patient taking differently: Take 20 mg by mouth daily. May  give 1 extra tab in the afternoon as needed for swelling 06/30/21  Yes Raulkar, Clide Deutscher, MD  guaiFENesin (MUCINEX) 600 MG 12 hr tablet Take 1 tablet (600 mg total) by mouth 2 (two) times daily. 05/27/21  Yes Angiulli, Lavon Paganini, PA-C  hydrALAZINE (APRESOLINE) 100 MG tablet Take 1 tablet (100 mg total) by mouth 2 (two) times daily. 05/27/21  Yes Angiulli, Lavon Paganini, PA-C  hydrALAZINE (APRESOLINE) 25 MG tablet Take 25 mg by mouth daily at 2 PM. 03/06/21  Yes [provider]  isosorbide mononitrate (IMDUR) 60 MG 24 hr tablet Take 1 tablet (60 mg total) by mouth 2 (two) times daily. 05/27/21  Yes Angiulli, Lavon Paganini, PA-C  metFORMIN  (GLUCOPHAGE) 500 MG tablet Take 1 tablet (500 mg total) by mouth 2 (two) times daily with a meal. 05/27/21  Yes Angiulli, Lavon Paganini, PA-C  nitroGLYCERIN (NITROSTAT) 0.4 MG SL tablet Place 0.4 mg under the tongue every 5 (five) minutes as needed for chest pain. 03/27/19  Yes [provider]  oxybutynin (DITROPAN) 5 MG tablet Take 5 mg by mouth daily at 2 PM.   Yes [provider]  pantoprazole (PROTONIX) 40 MG tablet Take 1 tablet (40 mg total) by mouth daily. Patient taking differently: Take 40 mg by mouth daily at 2 PM. 05/27/21  Yes Angiulli, Lavon Paganini, PA-C  saccharomyces boulardii (FLORASTOR) 250 MG capsule Take 1 capsule (250 mg total) by mouth 2 (two) times daily. 05/27/21  Yes Angiulli, Lavon Paganini, PA-C  sodium bicarbonate 650 MG tablet Take 1 tablet (650 mg total) by mouth 2 (two) times daily. 05/27/21  Yes Angiulli, Lavon Paganini, PA-C  sodium chloride flush 0.9 % SOLN injection 5 mLs by Intracatheter route every 12 (twelve) hours. 04/02/21 09/29/21 Yes [provider]  Zinc Sulfate 220 (50 Zn) MG TABS Take 1 tablet (220 mg total) by mouth daily. Patient taking differently: Take 220 mg by mouth daily at 2 PM. 05/27/21  Yes Angiulli, Lavon Paganini, PA-C  folic acid (FOLVITE) 1 MG tablet Take 1 tablet (1 mg total) by mouth daily. Patient not taking: Reported on 07/17/2021 05/27/21   Angiulli, Lavon Paganini, PA-C  oxybutynin (DITROPAN-XL) 10 MG 24 hr tablet Take 10 mg by mouth daily. 06/25/21   [provider]  polyethylene glycol (MIRALAX / GLYCOLAX) 17 g packet Take 17 g by mouth daily. Patient not taking: Reported on 07/17/2021 05/27/21   Angiulli, Lavon Paganini, PA-C      Allergies    Atorvastatin, Crestor [rosuvastatin], Other, Pravastatin, Simvastatin, and Statins    Review of Systems   Review of Systems  Physical Exam Updated Vital Signs BP 131/68    Pulse 77    Temp (!) 100.6 F (38.1 C) (Rectal)    Resp (!) 21    SpO2 96%  Physical Exam Vitals and nursing note reviewed.   Constitutional:      General: He is in acute distress.     Appearance: He is well-developed.  HENT:     Head: Normocephalic and atraumatic.     Mouth/Throat:     Mouth: Mucous membranes are moist.     Pharynx: Oropharynx is clear.  Eyes:     Pupils: Pupils are equal, round, and reactive to light.  Cardiovascular:     Rate and Rhythm: Tachycardia present. Rhythm irregular.  Pulmonary:     Effort: Tachypnea and accessory muscle usage present. No respiratory distress.     Breath sounds: Examination of the right-middle field reveals rales. Examination  of the right-lower field reveals rales. Rales present.     Comments: Tachypneic Abdominal:     General: There is no distension.  Musculoskeletal:        General: No swelling or tenderness. Normal range of motion.     Cervical back: Normal range of motion.  Skin:    General: Skin is warm and dry.  Neurological:     General: No focal deficit present.     Mental Status: He is alert.    ED Results / Procedures / Treatments   Labs (all labs ordered are listed, but only abnormal results are displayed) Labs Reviewed  CBC WITH DIFFERENTIAL/PLATELET - Abnormal; Notable for the following components:      Result Value   WBC 13.3 (*)    RBC 1.99 (*)    Hemoglobin 6.4 (*)    HCT 18.8 (*)    RDW 25.2 (*)    nRBC 1.1 (*)    Neutro Abs 9.7 (*)    Monocytes Absolute 1.5 (*)    nRBC 4 (*)    Abs Immature Granulocytes 0.10 (*)    All other components within normal limits  COMPREHENSIVE METABOLIC PANEL - Abnormal; Notable for the following components:   Glucose, Bld 193 (*)    BUN 31 (*)    Creatinine, Ser 2.46 (*)    Calcium 8.7 (*)    Total Bilirubin 2.6 (*)    GFR, Estimated 25 (*)    All other components within normal limits  BRAIN NATRIURETIC PEPTIDE - Abnormal; Notable for the following components:   B Natriuretic Peptide 988.9 (*)    All other components within normal limits  URINALYSIS, ROUTINE W REFLEX MICROSCOPIC - Abnormal;  Notable for the following components:   Protein, ur 100 (*)    Nitrite POSITIVE (*)    Leukocytes,Ua MODERATE (*)    Bacteria, UA MANY (*)    All other components within normal limits  TROPONIN I (HIGH SENSITIVITY) - Abnormal; Notable for the following components:   Troponin I (High Sensitivity) 22 (*)    All other components within normal limits  TROPONIN I (HIGH SENSITIVITY) - Abnormal; Notable for the following components:   Troponin I (High Sensitivity) 33 (*)    All other components within normal limits  CULTURE, BLOOD (ROUTINE X 2)  CULTURE, BLOOD (ROUTINE X 2)  EXPECTORATED SPUTUM ASSESSMENT W GRAM STAIN, RFLX TO RESP C  LACTIC ACID, PLASMA  LACTIC ACID, PLASMA  CBC WITH DIFFERENTIAL/PLATELET  STREP PNEUMONIAE URINARY ANTIGEN  LEGIONELLA PNEUMOPHILA SEROGP 1 UR AG  COMPREHENSIVE METABOLIC PANEL  CBC    EKG None  Radiology DG Chest Portable 1 View  Result Date: 07/17/2021 CLINICAL DATA:  EVA EXAM: PORTABLE CHEST 1 VIEW COMPARISON:  04/29/2021 FINDINGS: Mild cardiomegaly. Small right pleural effusion and bibasilar atelectasis. Remote median sternotomy. IMPRESSION: Small right pleural effusion and bibasilar atelectasis. Electronically Signed   By: Ulyses Jarred M.D.   On: 07/17/2021 02:30    Procedures .Critical Care Performed by: Merrily Pew, MD Authorized by: Merrily Pew, MD   Critical care provider statement:    Critical care time (minutes):  30   Critical care was necessary to treat or prevent imminent or life-threatening deterioration of the following conditions:  Respiratory failure and sepsis   Critical care was time spent personally by me on the following activities:  Development of treatment plan with patient or surrogate, discussions with consultants, evaluation of patient's response to treatment, examination of patient, ordering and review of laboratory  studies, ordering and review of radiographic studies, ordering and performing treatments and interventions,  pulse oximetry, re-evaluation of patient's condition and review of old charts    Medications Ordered in ED Medications  azithromycin (ZITHROMAX) 500 mg in sodium chloride 0.9 % 250 mL IVPB (500 mg Intravenous New Bag/Given 07/17/21 0329)  lactated ringers infusion ( Intravenous New Bag/Given 07/17/21 0328)  acetaminophen (TYLENOL) tablet 650 mg (650 mg Oral Given 07/17/21 0210)  amLODipine (NORVASC) tablet 10 mg (has no administration in time range)  carvedilol (COREG) tablet 6.25 mg (has no administration in time range)  doxazosin (CARDURA) tablet 4 mg (has no administration in time range)  hydrALAZINE (APRESOLINE) tablet 100 mg (has no administration in time range)  isosorbide mononitrate (IMDUR) 24 hr tablet 60 mg (has no administration in time range)  nitroGLYCERIN (NITROSTAT) SL tablet 0.4 mg (has no administration in time range)  pantoprazole (PROTONIX) EC tablet 40 mg (has no administration in time range)  saccharomyces boulardii (FLORASTOR) capsule 250 mg (has no administration in time range)  sodium bicarbonate tablet 650 mg (has no administration in time range)  oxybutynin (DITROPAN-XL) 24 hr tablet 10 mg (has no administration in time range)  clopidogrel (PLAVIX) tablet 75 mg (has no administration in time range)  vitamin B-12 (CYANOCOBALAMIN) tablet 1,000 mcg (has no administration in time range)  ferrous sulfate tablet 325 mg (has no administration in time range)  folic acid (FOLVITE) tablet 1 mg (has no administration in time range)  ascorbic acid (VITAMIN C) tablet 500 mg (has no administration in time range)  sodium chloride flush 0.9 % injection 5 mL (has no administration in time range)  Zinc Sulfate TABS 220 mg (has no administration in time range)  albuterol (PROVENTIL) (2.5 MG/3ML) 0.083% nebulizer solution 3 mL (has no administration in time range)  guaiFENesin (MUCINEX) 12 hr tablet 600 mg (has no administration in time range)  furosemide (LASIX) injection 40 mg (has no  administration in time range)  enoxaparin (LOVENOX) injection 40 mg (has no administration in time range)  cefTRIAXone (ROCEPHIN) 2 g in sodium chloride 0.9 % 100 mL IVPB (has no administration in time range)  azithromycin (ZITHROMAX) 500 mg in sodium chloride 0.9 % 250 mL IVPB (has no administration in time range)  sodium chloride flush (NS) 0.9 % injection 3 mL (has no administration in time range)  sodium chloride flush (NS) 0.9 % injection 3 mL (has no administration in time range)  0.9 %  sodium chloride infusion (has no administration in time range)  acetaminophen (TYLENOL) tablet 650 mg (has no administration in time range)    Or  acetaminophen (TYLENOL) suppository 650 mg (has no administration in time range)  traZODone (DESYREL) tablet 25 mg (has no administration in time range)  ondansetron (ZOFRAN) tablet 4 mg (has no administration in time range)    Or  ondansetron (ZOFRAN) injection 4 mg (has no administration in time range)  magnesium hydroxide (MILK OF MAGNESIA) suspension 30 mL (has no administration in time range)  cefTRIAXone (ROCEPHIN) 2 g in sodium chloride 0.9 % 100 mL IVPB (0 g Intravenous Stopped 07/17/21 0318)    ED Course/ Medical Decision Making/ A&P                           Medical Decision Making Amount and/or Complexity of Data Reviewed Labs: ordered. Radiology: ordered.  Risk OTC drugs. Prescription drug management. Decision regarding hospitalization.   Patient felt hot so rectal temperature  taken and was found to be febrile.  Code sepsis initiated at this time.  Blood cultures drawn.  Started on antibiotics for concern with a productive cough, weakness, fever, leukocytosis and hypoxia with tachycardia to be related to the lungs.  Rocephin and azithromycin.  Lactic okay.  His BNP did come back elevated and radiology's read of the x-ray is concerning for pulmonary edema.  This does not really fit with the fever and leukocytosis so will not diurese at this  time.  My read of the x-ray does not look like a right-sided pneumonia.  Discussed with Dr. Gayla Medicus with the hospitalist service who will admit.  He will take over further management of CHF versus pneumonia with hypoxic respiratory failure.   Final Clinical Impression(s) / ED Diagnoses Final diagnoses:  None    Rx / DC Orders ED Discharge Orders     None         Layah Skousen, Corene Cornea, MD 07/17/21 [phone]

## 2021-07-17 NOTE — Progress Notes (Addendum)
PROGRESS NOTE    [name]  VOJ:[id number] DOB: 19-Aug-1930 DOA: 07/16/2021 PCP: Leonides Sake, MD   Brief Narrative:  [name] is a 88 y.o. Caucasian male with medical history significant for AAA, anemia, combined systolic and diastolic CHF, coronary artery disease status post CABG, essential hypertension, dyslipidemia with type 2 diabetes mellitus, who presented to the emergency room with a consult of Fall and subsequent biliary drain dislodgment.  The patient had a cholecystitis in October at which time he was not thought to be a good surgical candidate given his multiple medical problems and therefore a biliary drain by IR was placed and he was given IV antibiotics and has been stable since then.  About a couple weeks ago he fell from his chair but it was forceful enough to dislodge his biliary drain at that time.  He admitted to worsening cough over the last couple weeks occasionally productive of brownish sputum, with mild dyspnea or wheezing.  No nausea or vomiting or abdominal pain.  No chest pain or palpitations.  His suprapubic catheter has been draining normally.   ED Course: Upon presentation to the emergency room, vital signs were within normal.  BMP showed a BUN of 31 and creatinine 2.46 close to baseline.  BNP in the PE was 988.9 and high-sensitivity troponin was 22.  Lactic acid was 1.5 and CBC showed anemia with hemoglobin of 6.4 and hematocrit of 18.8 comparable to a month ago. EKG as reviewed by me : Pending Imaging: Portable chest ray showed small right pleural effusion and bibasilar atelectasis.  The patient was given IV Rocephin and Zithromax was 6 and 50 mg of p.o. Tylenol.  He will be admitted to a cardiac telemetry bed for further evaluation and management  Assessment & Plan:   Active Problems:   Diabetes mellitus type 2 in obese (HCC)   Hyperlipidemia associated with type 2 diabetes mellitus ([place])   Coronary artery disease involving native coronary artery of  native heart with angina pectoris [facility])   Essential hypertension   Coronary artery disease involving coronary bypass graft of native heart with angina pectoris ([place])   Anemia due to chronic kidney disease   Acute on chronic systolic CHF (congestive heart failure) ([place])   Community acquired pneumonia   Biliary drain displacement   Sepsis due to pneumonia ([place])  Acute hypoxic respiratory failure secondary to Acute on chronic systolic CHF, POA: Echo done in December 2022 shows 45 to 50% ejection fraction with global hypokinesis.  Currently requiring 2 L of oxygen.  Chest x-ray shows small right pleural effusion.  BNP elevated more than baseline.  We will continue IV Lasix with strict I's and O's, 1500 cc fluid restriction and daily weight.  For some reason he is on IV fluids which I stopped.  Sepsis secondary to Healthcare associated pneumonia: Patient qualifies for healthcare associated pneumonia diagnosis since he was admitted in the hospital for more than 3 days within the last 90 days.  He meets sepsis criteria based on fever, tachycardia and tachypnea.  I will transition him from Rocephin to cefepime and continue azithromycin.  Follow culture.  Biliary drain displacement: Consulted IR.  They assessed patient.  They told me that they were going to remove the drain in the middle of March and that currently patient does not have any symptoms regarding acute cholecystitis so they will leave that out for now.  Monitor LFTs.  COVID-19 infection: Patient has tested positive this admission but it appears that  he was also positive about 2 months ago so this is likely continuation of the previous positive test.  No indication of treatment for COVID-19 at this point in time.  However I will check CRP, procalcitonin as well as D-dimer.  Diabetes mellitus type 2 in obese [facility])- (present on admission): Hemoglobin A1c 6.9.  He is only on metformin at home which is on hold.  Continue SSI   Anemia due to chronic  kidney disease: Patient's baseline hemoglobin is 6.  Patient is Jehovah's Witness.  Hemoglobin dropped to 5.2.  Discussed in length with the patient and his daughter and they are declining blood transfusion.  They were made aware by me and have been made aware by many physicians in the past that this sort of severe anemia can be very dangerous and can cause death.  They verbalized understanding and still declined blood transfusion.  Essential hypertension: Controlled.  Continue home medications.   Coronary artery disease involving native coronary artery of native heart with angina pectoris ([place])- (present on admission): Asymptomatic - We will continue Plavix, Imdur, as needed sublingual nitroglycerin, Coreg and statin therapy.   Hyperlipidemia: Continue statin.  DVT prophylaxis: enoxaparin (LOVENOX) injection 30 mg Start: 07/18/21 1400   Code Status: DNR  Family Communication:  None present at bedside.  Discussed with daughter over the phone.  Status is: Inpatient Remains inpatient appropriate because: He needs inpatient management.   Estimated body mass index is 27.57 kg/m as calculated from the following:   Height as of this encounter: 6\' 1"  (1.854 m).   Weight as of this encounter: 94.8 kg.  Pressure Injury 05/05/21 Buttocks Right Stage 1 -  Intact skin with non-blanchable redness of a localized area usually over a bony prominence. small open area on right buttocks (Active)  05/05/21 1700  Location: Buttocks  Location Orientation: Right  Staging: Stage 1 -  Intact skin with non-blanchable redness of a localized area usually over a bony prominence.  Wound Description (Comments): small open area on right buttocks  Present on Admission:    Nutritional Assessment: Body mass index is 27.57 kg/m.Marland Kitchen Seen by dietician.  I agree with the assessment and plan as outlined below: Nutrition Status:        . Skin Assessment: I have examined the patient's skin and I agree with the wound  assessment as performed by the wound care RN as outlined below: Pressure Injury 05/05/21 Buttocks Right Stage 1 -  Intact skin with non-blanchable redness of a localized area usually over a bony prominence. small open area on right buttocks (Active)  05/05/21 1700  Location: Buttocks  Location Orientation: Right  Staging: Stage 1 -  Intact skin with non-blanchable redness of a localized area usually over a bony prominence.  Wound Description (Comments): small open area on right buttocks  Present on Admission:     Consultants:  None  Procedures:  None  Antimicrobials:  Anti-infectives (From admission, onward)    Start     Dose/Rate Route Frequency Ordered Stop   07/18/21 0400  azithromycin (ZITHROMAX) 500 mg in sodium chloride 0.9 % 250 mL IVPB        500 mg 250 mL/hr over 60 Minutes Intravenous Every 24 hours 07/17/21 0512 07/22/21 0359   07/18/21 0000  cefTRIAXone (ROCEPHIN) 2 g in sodium chloride 0.9 % 100 mL IVPB  Status:  Discontinued        2 g 200 mL/hr over 30 Minutes Intravenous Every 24 hours 07/17/21 0512 07/17/21 1607  07/17/21 0500  cefTRIAXone (ROCEPHIN) 2 g in sodium chloride 0.9 % 100 mL IVPB  Status:  Discontinued        2 g 200 mL/hr over 30 Minutes Intravenous Every 24 hours 07/17/21 0452 07/17/21 0512   07/17/21 0500  azithromycin (ZITHROMAX) 500 mg in sodium chloride 0.9 % 250 mL IVPB  Status:  Discontinued        500 mg 250 mL/hr over 60 Minutes Intravenous Every 24 hours 07/17/21 0452 07/17/21 0512   07/17/21 0130  cefTRIAXone (ROCEPHIN) 2 g in sodium chloride 0.9 % 100 mL IVPB        2 g 200 mL/hr over 30 Minutes Intravenous  Once 07/17/21 0128 07/17/21 0318   07/17/21 0130  azithromycin (ZITHROMAX) 500 mg in sodium chloride 0.9 % 250 mL IVPB  Status:  Discontinued        500 mg 250 mL/hr over 60 Minutes Intravenous Every 24 hours 07/17/21 0128 07/17/21 0527         Subjective: Seen and examined the ED today.  He had no complaints.  No shortness of  breath.  He was fully alert and oriented.  Objective: Vitals:   07/17/21 1145 07/17/21 1157 07/17/21 1415 07/17/21 1458  BP: (!) 107/57 (!) 107/57 123/68 137/68  Pulse: 80 84 72 72  Resp: 14 15 13 17   Temp:  98.3 F (36.8 C)  97.6 F (36.4 C)  TempSrc:  Oral  Oral  SpO2: 95% 95% 94% 98%  Weight:      Height:        Intake/Output Summary (Last 24 hours) at 07/17/2021 1627 Last data filed at 07/17/2021 2426 Gross per 24 hour  Intake --  Output 1400 ml  Net -1400 ml   Filed Weights   07/17/21 0500  Weight: 94.8 kg    Examination:  General exam: Appears calm and comfortable, looks chronically sick Respiratory system: Diminished breath sounds, respiratory effort normal. Cardiovascular system: S1 & S2 heard, RRR. No JVD, murmurs, rubs, gallops or clicks. No pedal edema. Gastrointestinal system: Abdomen is nondistended, soft and nontender. No organomegaly or masses felt. Normal bowel sounds heard. Central nervous system: Alert and oriented. No focal neurological deficits. Extremities: Symmetric 5 x 5 power.  Data Reviewed: I have personally reviewed following labs and imaging studies  CBC: Recent Labs  Lab 07/17/21 0057 07/17/21 0700  WBC 13.3* 17.1*  NEUTROABS 9.7*  --   HGB 6.4* 5.2*  HCT 18.8* 15.9*  MCV 94.5 95.2  PLT 372 834   Basic Metabolic Panel: Recent Labs  Lab 07/17/21 0057 07/17/21 0700  NA 135 136  K 3.8 3.8  CL 99 99  CO2 24 26  GLUCOSE 193* 144*  BUN 31* 31*  CREATININE 2.46* 2.47*  CALCIUM 8.7* 8.3*   GFR: Estimated Creatinine Clearance: 23.4 mL/min (A) (by C-G formula based on SCr of 2.47 mg/dL (H)). Liver Function Tests: Recent Labs  Lab 07/17/21 0057 07/17/21 0700  AST 16 18  ALT 10 10  ALKPHOS 61 52  BILITOT 2.6* 1.1  PROT 6.6 5.6*  ALBUMIN 3.7 3.1*   No results for input(s): LIPASE, AMYLASE in the last 168 hours. No results for input(s): AMMONIA in the last 168 hours. Coagulation Profile: No results for input(s): INR,  PROTIME in the last 168 hours. Cardiac Enzymes: No results for input(s): CKTOTAL, CKMB, CKMBINDEX, TROPONINI in the last 168 hours. BNP (last 3 results) No results for input(s): PROBNP in the last 8760 hours. HbA1C: Recent Labs  07/17/21 0701  HGBA1C 6.9*   CBG: Recent Labs  Lab 07/17/21 0833 07/17/21 1154  GLUCAP 125* 139*   Lipid Profile: No results for input(s): CHOL, HDL, LDLCALC, TRIG, CHOLHDL, LDLDIRECT in the last 72 hours. Thyroid Function Tests: No results for input(s): TSH, T4TOTAL, FREET4, T3FREE, THYROIDAB in the last 72 hours. Anemia Panel: No results for input(s): VITAMINB12, FOLATE, FERRITIN, TIBC, IRON, RETICCTPCT in the last 72 hours. Sepsis Labs: Recent Labs  Lab 07/17/21 0057 07/17/21 0257  LATICACIDVEN 1.5 1.2    Recent Results (from the past 240 hour(s))  Resp Panel by RT-PCR (Flu A&B, Covid) Nasopharyngeal Swab     Status: Abnormal   Collection Time: 07/17/21 11:54 AM   Specimen: Nasopharyngeal Swab; Nasopharyngeal(NP) swabs in vial transport medium  Result Value Ref Range Status   SARS Coronavirus 2 by RT PCR POSITIVE (A) NEGATIVE Final    Comment: (NOTE) SARS-CoV-2 target nucleic acids are DETECTED.  The SARS-CoV-2 RNA is generally detectable in upper respiratory specimens during the acute phase of infection. Positive results are indicative of the presence of the identified virus, but do not rule out bacterial infection or co-infection with other pathogens not detected by the test. Clinical correlation with patient history and other diagnostic information is necessary to determine patient infection status. The expected result is Negative.  Fact Sheet for Patients: EntrepreneurPulse.com.au  Fact Sheet for Healthcare Providers: IncredibleEmployment.be  This test is not yet approved or cleared by the [place] FDA and  has been authorized for detection and/or diagnosis of SARS-CoV-2 by FDA under an  Emergency Use Authorization (EUA).  This EUA will remain in effect (meaning this test can be used) for the duration of  the COVID-19 declaration under Section 564(b)(1) of the A ct, 21 U.S.C. section 360bbb-3(b)(1), unless the authorization is terminated or revoked sooner.     Influenza A by PCR NEGATIVE NEGATIVE Final   Influenza B by PCR NEGATIVE NEGATIVE Final    Comment: (NOTE) The Xpert Xpress SARS-CoV-2/FLU/RSV plus assay is intended as an aid in the diagnosis of influenza from Nasopharyngeal swab specimens and should not be used as a sole basis for treatment. Nasal washings and aspirates are unacceptable for Xpert Xpress SARS-CoV-2/FLU/RSV testing.  Fact Sheet for Patients: EntrepreneurPulse.com.au  Fact Sheet for Healthcare Providers: IncredibleEmployment.be  This test is not yet approved or cleared by the [place] FDA and has been authorized for detection and/or diagnosis of SARS-CoV-2 by FDA under an Emergency Use Authorization (EUA). This EUA will remain in effect (meaning this test can be used) for the duration of the COVID-19 declaration under Section 564(b)(1) of the Act, 21 U.S.C. section 360bbb-3(b)(1), unless the authorization is terminated or revoked.  Performed at [facility], [place] [address] [place], [place] [address]      Radiology Studies: DG Chest Portable 1 View  Result Date: 07/17/2021 CLINICAL DATA:  EVA EXAM: PORTABLE CHEST 1 VIEW COMPARISON:  04/29/2021 FINDINGS: Mild cardiomegaly. Small right pleural effusion and bibasilar atelectasis. Remote median sternotomy. IMPRESSION: Small right pleural effusion and bibasilar atelectasis. Electronically Signed   By: Ulyses Jarred M.D.   On: 07/17/2021 02:30    Scheduled Meds:  amLODipine  10 mg Oral Q1500   ascorbic acid  500 mg Oral Q1400   carvedilol  6.25 mg Oral BID WC   clopidogrel  75 mg Oral Q1400   doxazosin  4 mg Oral Q1400   [START ON 07/18/2021]  enoxaparin (LOVENOX) injection  30 mg Subcutaneous Q24H  ferrous sulfate  325 mg Oral [id number]   folic acid  1 mg Oral Daily   furosemide  40 mg Intravenous BID   guaiFENesin  600 mg Oral BID   hydrALAZINE  100 mg Oral BID   insulin aspart  0-9 Units Subcutaneous TID AC & HS   ipratropium-albuterol  3 mL Nebulization QID   isosorbide mononitrate  60 mg Oral BID   oxybutynin  10 mg Oral Daily   pantoprazole  40 mg Oral Q1400   saccharomyces boulardii  250 mg Oral BID   sodium bicarbonate  650 mg Oral BID   sodium chloride flush  3 mL Intravenous Q12H   sodium chloride flush  5 mL Intracatheter Q12H   cyanocobalamin  1,000 mcg Oral Q1400   zinc sulfate  220 mg Oral Q1400   Continuous Infusions:  sodium chloride     [START ON 07/18/2021] azithromycin       LOS: 0 days   Time spent: 36 minutes  Darliss Cheney, MD [facility]  07/17/2021, 4:27 PM  Please page via [facility] and do not message via secure chat for urgent patient care matters. Secure chat can be used for non urgent patient care matters.  How to contact the [facility] Attending or Consulting provider [place] or covering provider during after hours [place], for this patient?  Check the care team in [facility] and look for a) attending/consulting [facility] provider listed and b) the [facility] team listed. Page or secure chat [phone]. Log into [email or web address] and use [place]'s universal password to access. If you do not have the password, please contact the hospital operator. Locate the [facility] provider you are looking for under [facility] and page to a number that you can be directly reached. If you still have difficulty reaching the provider, please page the [facility] (Director on Call) for the Hospitalists listed on amion for assistance.

## 2021-07-17 NOTE — Progress Notes (Signed)
Pharmacy Antibiotic Note ? ?[name] is a 88 y.o. male admitted on 07/16/2021 with pneumonia.  Pharmacy has been consulted for Cefepime dosing. ? ?Plan: ?Cefepime 2gm IV q24h ? ?Height: 6\' 1"  (185.4 cm) ?Weight: 94.8 kg (209 lb) ?IBW/kg (Calculated) : 79.9 ? ?Temp (24hrs), Avg:99 ?F (37.2 ?C), Min:97.6 ?F (36.4 ?C), Max:100.6 ?F (38.1 ?C) ? ?Recent Labs  ?Lab 07/17/21 ?3159 07/17/21 ?0257 07/17/21 ?0700  ?WBC 13.3*  --  17.1*  ?CREATININE 2.46*  --  2.47*  ?LATICACIDVEN 1.5 1.2  --   ?  ?Estimated Creatinine Clearance: 23.4 mL/min (A) (by C-G formula based on SCr of 2.47 mg/dL (H)).   ? ?Allergies  ?Allergen Reactions  ? Atorvastatin Other (See Comments)  ?  Myalgias  ? Crestor [Rosuvastatin] Other (See Comments)  ?  Myalgias   ? Other Other (See Comments)  ?  No  BLOOD PRODUCTS - Pt is Jehovah's Witness  ? Pravastatin Other (See Comments)  ?  Myalgias  ? Simvastatin Other (See Comments)  ?  Myalgias ?  ? Statins Other (See Comments)  ?  Cause muscle aches  ? ? ?Antimicrobials this admission: ?Rocephin 3/2 x 1 in ED ?Azithro 3/2 >> ?Cefepime 3/2 >> ? ?Dose adjustments this admission: ? ?Microbiology results: ?3/2 BCx: pending ?3/2 Sputum: ordered  ? ?Thank you for allowing pharmacy to be a part of this patient?s care. ? ?[name], [place]s ?07/17/2021 4:53 PM ? ?

## 2021-07-17 NOTE — Assessment & Plan Note (Signed)
-   We will continue his antihypertensives. 

## 2021-07-17 NOTE — Assessment & Plan Note (Signed)
-   This is currently stable. ?- H&H will be followed. ?

## 2021-07-17 NOTE — Assessment & Plan Note (Signed)
-   We will continue statin therapy. 

## 2021-07-17 NOTE — H&P (Addendum)
[name]   PATIENT NAME: [name]    MR#:  [id number]  DATE OF BIRTH:  [birth year]  DATE OF ADMISSION:  07/16/2021  PRIMARY CARE PHYSICIAN: Hamrick, Lorin Mercy, MD   Patient is coming from: Home  REQUESTING/REFERRING PHYSICIAN: Mesner, Corene Cornea, MD  CHIEF COMPLAINT:  Fall and subsequent biliary drain dislodgment Cough and dyspnea  HISTORY OF PRESENT ILLNESS:  [name] is a 88 y.o. Caucasian male with medical history significant for AAA, anemia, combined systolic and diastolic CHF, coronary artery disease status post CABG, essential hypertension, dyslipidemia with type 2 diabetes mellitus, who presented to the emergency room with a consult of Fall and subsequent biliary drain dislodgment.  The patient had a cholecystitis in October at which time he was not thought to be a good surgical candidate given his multiple medical problems and therefore a biliary drain by IR was placed and he was given IV antibiotics and has been stable since then.  About a couple weeks ago he fell from his chair but it was forceful enough to dislodge his biliary drain at that time.  He admitted to worsening cough over the last couple weeks occasionally productive of brownish sputum, with mild dyspnea or wheezing.  No nausea or vomiting or abdominal pain.  No chest pain or palpitations.  His suprapubic catheter has been draining normally.  ED Course: Upon presentation to the emergency room, vital signs were within normal.  BMP showed a BUN of 31 and creatinine 2.46 close to baseline.  BNP in the PE was 988.9 and high-sensitivity troponin was 22.  Lactic acid was 1.5 and CBC showed anemia with hemoglobin of 6.4 and hematocrit of 18.8 comparable to a month ago. EKG as reviewed by me : Pending Imaging: Portable chest ray showed small right pleural effusion and bibasilar atelectasis.  The patient was given IV Rocephin and Zithromax was 6 and 50 mg of p.o. Tylenol.  He will be admitted to a cardiac telemetry  bed for further evaluation and management PAST MEDICAL HISTORY:   Past Medical History:  Diagnosis Date   Abdominal aortic aneurysm    a. Aortic duplex 06/2014: mild aneurysmal dilatation of proximal abdominal aorta measuring 3.4x3.4cm. No sig change from 2012. 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 Patent LIMA to LAD. Acutely occluded SVG to PDA within previously placed stent. 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 (03/26/2016)   Stenosis of left carotid artery    Suprapubic catheter ([place])    Vitamin D deficiency 05/27/2018    PAST SURGICAL HISTORY:   Past Surgical History:  Procedure Laterality Date   APPENDECTOMY     CARDIAC CATHETERIZATION  12/11/2010   Dr. Chase Picket - subsequent cath - normal  LV systolic function, no renal artery stenosis, severe 2-vessel disease with subtotaled RCA prox and distal 60% lesion and complex 70% area of narrowing of ostium of LAD   CARDIAC CATHETERIZATION N/A 03/26/2016   Procedure: Left Heart Cath and Coronary Angiography;  Surgeon: Nelva Bush, MD;  Location: [place] [facility];  Service: Cardiovascular;  Laterality: N/A;   CARDIAC CATHETERIZATION N/A 03/26/2016   Procedure: Coronary Stent Intervention;  Surgeon: Nelva Bush, MD;  Location: [place] [facility];  Service: Cardiovascular: 100% In-stent thrombosis of pros SVG-RCA (Xience DES) --> treated with PromusDES 3.0 x 18 (3.3 mm)   CARDIAC CATHETERIZATION N/A 03/26/2016   Procedure: Bypass Graft Angiography;  Surgeon: Nelva Bush, MD;  Location: [place] [facility];  Service: Cardiovascular;  Laterality: N/A;   CORONARY ANGIOPLASTY WITH STENT PLACEMENT  06/18/2014   PCI to SVG-RPDA 06/18/14 (Xience [place]pine DES 3.0 x 18 mm -3.35 mm),    CORONARY ARTERY BYPASS GRAFT  12/15/2010   X2, Dr Servando Snare; LIMA to LAD, SVG to PDA;    CORONARY BALLOON ANGIOPLASTY N/A 04/18/2018   Procedure: CORONARY BALLOON ANGIOPLASTY;  Surgeon: Leonie Man, MD;  Location: [place] [facility];  Service: Cardiovascular;;; high pressure scoring and noncompliant balloon PTCA of SVG-RCA ISR ostial and proximal   CORONARY/GRAFT ANGIOGRAPHY N/A 08/27/2017   Procedure: CORONARY/GRAFT ANGIOGRAPHY;  Surgeon: Nelva Bush, MD;  Location: [place] [facility];  Service: Cardiovascular;; pLAD 70%, ostD1 50%.  mCx 60%, OM2 80%. pRCA 95% & mRCA 100% - rPDA 70%. LIMA-mLAD patent. SVG-rPDA 10% ISR.    CYSTOSCOPY WITH URETHRAL DILATATION     IR 3D INDEPENDENT WKST  10/26/2018   IR ANGIO INTRA EXTRACRAN SEL COM CAROTID INNOMINATE BILAT MOD SED  02/25/2017   IR ANGIO INTRA EXTRACRAN SEL COM CAROTID INNOMINATE BILAT MOD SED  10/19/2017   Dr. Kathee Delton: L Common Carotid - ECA & major branches widely patent. ICA ~20% distal to bulb & 50% in  supraclinoid segment. LMCA-distal 1/3 MI ~905 stenosis with post-stenotic dilation into inferior division. ~50% prox Basilar A stenosis @ anterior Inf Cerebellar A. 50% R ICA   IR ANGIO INTRA EXTRACRAN SEL COM CAROTID INNOMINATE UNI L MOD SED  06/06/2018   IR ANGIO INTRA EXTRACRAN SEL COM CAROTID INNOMINATE UNI L MOD SED  10/26/2018   IR ANGIO VERTEBRAL SEL SUBCLAVIAN INNOMINATE UNI L MOD SED  02/25/2017   IR ANGIO VERTEBRAL SEL SUBCLAVIAN INNOMINATE UNI R MOD SED  10/19/2017   IR ANGIO VERTEBRAL SEL VERTEBRAL UNI L MOD SED  10/19/2017   IR EXCHANGE BILIARY DRAIN  05/06/2021   IR EXCHANGE BILIARY DRAIN  06/17/2021   IR GENERIC HISTORICAL  01/21/2016   IR RADIOLOGIST EVAL & MGMT 01/21/2016 [facility]   IR GENERIC HISTORICAL  02/03/2016   IR CATHETER TUBE CHANGE 02/03/2016 Marybelle Killings, MD [facility]   IR RADIOLOGIST EVAL & MGMT  11/09/2017   LEFT HEART CATH AND CORONARY ANGIOGRAPHY N/A 04/18/2018   Procedure: LEFT HEART CATH AND CORONARY ANGIOGRAPHY;  Surgeon: Leonie Man, MD;  Location:  [place] [facility];  Service: Cardiovascular;  Laterality: N/A; stable findings on last cath with exception of 75% in-stent restenosis of SVG-RCA ostial stent treated with PTCA.     LEFT HEART CATHETERIZATION WITH CORONARY ANGIOGRAM N/A 06/18/2014   Procedure: LEFT HEART CATHETERIZATION WITH CORONARY ANGIOGRAM;  Surgeon: Leonie Man, MD;  Location: [facility];  Service: Cardiovascular;  -- severe disease of SVG-rPDA   NO PAST SURGERIES     RADIOLOGY WITH ANESTHESIA N/A 01/24/2015   Procedure: STENT ASSISTED ANGIOPLASTY (RADIOLOGY WITH ANESTHESIA);  Surgeon: Luanne Bras, MD;  Location: [place];  Service: Radiology;  Laterality: N/A;   RADIOLOGY WITH ANESTHESIA N/A 06/06/2018   Procedure: STENTING;  Surgeon: Luanne Bras, MD;  Location: [place];  Service: Radiology;  Laterality: N/A;   RADIOLOGY WITH ANESTHESIA N/A 10/26/2018   Procedure: RADIOLOGY WITH ANESTHESIA;  Surgeon: Luanne Bras, MD;  Location:  [place];  Service: Radiology;  Laterality: N/A;   TONSILLECTOMY     TRANSTHORACIC ECHOCARDIOGRAM  08/2017; 04/2018:    A) EF 60-65%. Mild LVH. No RWMA. Gr 1 DD. Mod LA dilation.;  B)  EF 35-40%.  Severe LVH.  GRII DD.  Apical anteroseptal hypokinesis.  Akinesis of the apex.    SOCIAL HISTORY:   Social History   Tobacco Use   Smoking status: Former    Packs/day: 0.33    Years: 10.00    Pack years: 3.30    Types: Cigarettes    Quit date: 1961    Years since quitting: 62.2   Smokeless tobacco: Never  Substance Use Topics   Alcohol use: No    FAMILY HISTORY:   Family History  Problem Relation Age of Onset   Heart Problems Father 37    DRUG ALLERGIES:   Allergies  Allergen Reactions   Atorvastatin Other (See Comments)    Myalgias   Crestor [Rosuvastatin] Other (See Comments)    Myalgias    Other Other (See Comments)    No  BLOOD PRODUCTS - Pt is Jehovah's Witness   Pravastatin Other (See Comments)    Myalgias   Simvastatin Other (See Comments)    Myalgias    Statins Other (See Comments)    Cause muscle aches    REVIEW OF SYSTEMS:   ROS As per history of present illness. All pertinent systems were reviewed above. Constitutional, HEENT, cardiovascular, respiratory, GI, GU, musculoskeletal, neuro, psychiatric, endocrine, integumentary and hematologic systems were reviewed and are otherwise negative/unremarkable except for positive findings mentioned above in the HPI.   MEDICATIONS AT HOME:   Prior to Admission medications   Medication Sig Start Date End Date Taking? Authorizing Provider  acetaminophen (TYLENOL) 325 MG tablet Take 2 tablets (650 mg total) by mouth every 6 (six) hours as needed for mild pain (or Fever >/= 101). Patient taking differently: Take 325 mg by mouth every 6 (six) hours as needed for mild pain or headache (or Fever >/= 101). 05/31/18  Yes Angiulli, Lavon Paganini, PA-C  albuterol (VENTOLIN HFA) 108 (90 Base) MCG/ACT inhaler Inhale 2 puffs into the  lungs every 2 (two) hours as needed for wheezing or shortness of breath. 05/27/21  Yes Angiulli, Lavon Paganini, PA-C  amLODipine (NORVASC) 10 MG tablet Take 1 tablet (10 mg total) by mouth daily in the afternoon. 05/27/21  Yes Angiulli, Lavon Paganini, PA-C  ascorbic acid (VITAMIN C) 500 MG tablet Take 1 tablet (500 mg total) by mouth daily. Patient taking differently: Take 500 mg by mouth daily at 2 PM. 05/27/21  Yes Angiulli, Lavon Paganini, PA-C  carvedilol (COREG) 6.25 MG tablet Take 1 tablet (6.25 mg total) by mouth 2 (two) times daily with a meal. 05/27/21  Yes Angiulli, Lavon Paganini, PA-C  clopidogrel (PLAVIX) 75 MG tablet Take 1 tablet (75 mg total) by mouth daily. Patient taking differently: Take 75 mg by mouth daily at 2 PM. 05/27/21  Yes Angiulli, Lavon Paganini, PA-C  cyanocobalamin 1000 MCG tablet Take 1 tablet (1,000 mcg total) by mouth daily. Patient taking differently: Take 1,000 mcg by mouth daily at 2 PM. 05/27/21  Yes Angiulli, Lavon Paganini, PA-C  doxazosin (CARDURA) 4 MG tablet Take 1 tablet (4 mg total) by mouth daily. Patient taking differently: Take 4 mg by mouth daily at 2 PM. 05/27/21  Yes Angiulli, Lavon Paganini, PA-C  ferrous sulfate 325 (65 FE) MG tablet Take 1 tablet (325 mg total) by mouth daily. Patient taking differently: Take 325 mg by mouth daily at 2 PM. 05/27/21  Yes Angiulli, Lavon Paganini, PA-C  folic acid (FOLVITE) 686 MCG tablet Take 400 mcg by mouth daily at 2 PM.   Yes [provider]  furosemide (LASIX) 20 MG tablet Take 1 tablet (20 mg total) by mouth as needed. Patient taking differently: Take 20 mg by mouth daily. May give 1 extra tab in the afternoon as needed for swelling 06/30/21  Yes Raulkar, Clide Deutscher, MD  guaiFENesin (MUCINEX) 600 MG 12 hr tablet Take 1 tablet (600 mg total) by mouth 2 (two) times daily. 05/27/21  Yes Angiulli, Lavon Paganini, PA-C  hydrALAZINE (APRESOLINE) 100 MG tablet Take 1 tablet (100 mg total) by mouth 2 (two) times daily. 05/27/21  Yes Angiulli, Lavon Paganini, PA-C   hydrALAZINE (APRESOLINE) 25 MG tablet Take 25 mg by mouth daily at 2 PM. 03/06/21  Yes [provider]  isosorbide mononitrate (IMDUR) 60 MG 24 hr tablet Take 1 tablet (60 mg total) by mouth 2 (two) times daily. 05/27/21  Yes Angiulli, Lavon Paganini, PA-C  metFORMIN (GLUCOPHAGE) 500 MG tablet Take 1 tablet (500 mg total) by mouth 2 (two) times daily with a meal. 05/27/21  Yes Angiulli, Lavon Paganini, PA-C  nitroGLYCERIN (NITROSTAT) 0.4 MG SL tablet Place 0.4 mg under the tongue every 5 (five) minutes as needed for chest pain. 03/27/19  Yes [provider]  oxybutynin (DITROPAN) 5 MG tablet Take 5 mg by mouth daily at 2 PM.   Yes [provider]  pantoprazole (PROTONIX) 40 MG tablet Take 1 tablet (40 mg total) by mouth daily. Patient taking differently: Take 40 mg by mouth daily at 2 PM. 05/27/21  Yes Angiulli, Lavon Paganini, PA-C  saccharomyces boulardii (FLORASTOR) 250 MG capsule Take 1 capsule (250 mg total) by mouth 2 (two) times daily. 05/27/21  Yes Angiulli, Lavon Paganini, PA-C  sodium bicarbonate 650 MG tablet Take 1 tablet (650 mg total) by mouth 2 (two) times daily. 05/27/21  Yes Angiulli, Lavon Paganini, PA-C  sodium chloride flush 0.9 % SOLN injection 5 mLs by Intracatheter route every 12 (twelve) hours. 04/02/21 09/29/21 Yes [provider]  Zinc Sulfate 220 (50 Zn) MG TABS Take 1 tablet (220 mg total) by mouth daily. Patient taking differently: Take 220 mg by mouth daily at 2 PM. 05/27/21  Yes Angiulli, Lavon Paganini, PA-C  folic acid (FOLVITE) 1 MG tablet Take 1 tablet (1 mg total) by mouth daily. Patient not taking: Reported on 07/17/2021 05/27/21   Angiulli, Lavon Paganini, PA-C  oxybutynin (DITROPAN-XL) 10 MG 24 hr tablet Take 10 mg by  mouth daily. 06/25/21   [provider]  polyethylene glycol (MIRALAX / GLYCOLAX) 17 g packet Take 17 g by mouth daily. Patient not taking: Reported on 07/17/2021 05/27/21   Cathlyn Parsons, PA-C      VITAL SIGNS:  Blood pressure (!) 125/57, pulse 84,  temperature 98.9 F (37.2 C), temperature source Oral, resp. rate (!) 21, height 6\' 1"  (1.854 m), weight 94.8 kg, SpO2 97 %.  PHYSICAL EXAMINATION:  Physical Exam  GENERAL:  88 y.o.-year-old Caucasian male patient lying in the bed with mild respiratory distress with conversational dyspnea. EYES: Pupils equal, round, reactive to light and accommodation. No scleral icterus. Extraocular muscles intact.  HEENT: Head atraumatic, normocephalic. Oropharynx and nasopharynx clear.  NECK:  Supple, no jugular venous distention. No thyroid enlargement, no tenderness.  LUNGS: Diminished bibasilar breath sounds with bibasal crackles. CARDIOVASCULAR: Regular rate and rhythm, S1, S2 normal. No murmurs, rubs, or gallops.  ABDOMEN: Soft, nondistended, nontender. Bowel sounds present. No organomegaly or mass.  EXTREMITIES: 1-2+ bilateral lower extremity pitting edema with no cyanosis, or clubbing.  NEUROLOGIC: Cranial nerves II through XII are intact. Muscle strength 5/5 in all extremities. Sensation intact. Gait not checked.  PSYCHIATRIC: The patient is alert and oriented x 3.  Normal affect and good eye contact. SKIN: No obvious rash, lesion, or ulcer.   LABORATORY PANEL:   CBC Recent Labs  Lab 07/17/21 0057  WBC 13.3*  HGB 6.4*  HCT 18.8*  PLT 372   ------------------------------------------------------------------------------------------------------------------  Chemistries  Recent Labs  Lab 07/17/21 0057  NA 135  K 3.8  CL 99  CO2 24  GLUCOSE 193*  BUN 31*  CREATININE 2.46*  CALCIUM 8.7*  AST 16  ALT 10  ALKPHOS 61  BILITOT 2.6*   ------------------------------------------------------------------------------------------------------------------  Cardiac Enzymes No results for input(s): TROPONINI in the last 168 hours. ------------------------------------------------------------------------------------------------------------------  RADIOLOGY:  DG Chest Portable 1  View  Result Date: 07/17/2021 CLINICAL DATA:  EVA EXAM: PORTABLE CHEST 1 VIEW COMPARISON:  04/29/2021 FINDINGS: Mild cardiomegaly. Small right pleural effusion and bibasilar atelectasis. Remote median sternotomy. IMPRESSION: Small right pleural effusion and bibasilar atelectasis. Electronically Signed   By: Ulyses Jarred M.D.   On: 07/17/2021 02:30      IMPRESSION AND PLAN:  Assessment and Plan: Acute on chronic systolic CHF (congestive heart failure) ([place])- (present on admission) - The patient be admitted to a cardiac telemetry bed. - He will be diuresed with IV Lasix. - We will follow serial troponins. - We will continue Coreg and Imdur. - Last echo in December 2022 revealed an EF of 45 to 50%.  Community acquired pneumonia - The patient will be placed on IV Rocephin and Zithromax. - Mucolytic therapy and bronchodilator therapy will be given. - We will follow blood cultures as well as sputum culture and sensitivity. - We will obtain pneumonia antigens.  Biliary drain displacement - This is secondary to mechanical fall. - IR consult can be obtained in a.m. to replace his drain. - We will monitor LFTs  Sepsis due to pneumonia [facility]) - This is manifested by leukopenia and tachypnea as well as fever. - The patient will be placed on IV Rocephin and Zithromax. - We will follow blood and sputum cultures as mentioned above. - We will continue hydration with IV normal saline.  Diabetes mellitus type 2 in obese [facility])- (present on admission) - The patient will be placed on supplement coverage with NovoLog. - We will hold off metformin.  Anemia due to chronic kidney disease -  This is currently stable. - H&H will be followed.  Essential hypertension We will continue his antihypertensives.  Coronary artery disease involving native coronary artery of native heart with angina pectoris ([place])- (present on admission) - We will continue Imdur, as needed sublingual nitroglycerin, Coreg and  statin therapy.  Hyperlipidemia associated with type 2 diabetes mellitus ([place])- (present on admission) - We will continue statin therapy.       DVT prophylaxis: Lovenox.  Advanced Care Planning:  Code Status: The patient is DNR/DNI. Family Communication:  The plan of care was discussed in details with the patient (and family). I answered all questions. The patient agreed to proceed with the above mentioned plan. Further management will depend upon hospital course. Disposition Plan: Back to previous home environment Consults called: none.  IR consult can be obtained in a.m. to replace his biliary drain.   All the records are reviewed and case discussed with ED provider.  Status is: Inpatient   At the time of the admission, it appears that the appropriate admission status for this patient is inpatient.  This is judged to be reasonable and necessary in order to provide the required intensity of service to ensure the patient's safety given the presenting symptoms, physical exam findings and initial radiographic and laboratory data in the context of comorbid conditions.  The patient requires inpatient status due to high intensity of service, high risk of further deterioration and high frequency of surveillance required.  I certify that at the time of admission, it is my clinical judgment that the patient will require inpatient hospital care extending more than 2 midnights.                            Dispo: The patient is from: Home              Anticipated d/c is to: Home              Patient currently is not medically stable to d/c.              Difficult to place patient: No  [name]  [facility]   From 7 PM-7 [name], contact night-coverage [email or web address]  CC: Primary care physician; Hamrick, Lorin Mercy, MD

## 2021-07-17 NOTE — ED Notes (Signed)
MD Mesner notified only able to get one IV - labs collected  ?

## 2021-07-17 NOTE — Assessment & Plan Note (Signed)
-   This is secondary to mechanical fall. ?- IR consult can be obtained in a.m. to replace his drain. ?- We will monitor LFTs ?

## 2021-07-17 NOTE — Assessment & Plan Note (Signed)
-   This is manifested by leukopenia and tachypnea as well as fever. ?- The patient will be placed on IV Rocephin and Zithromax. ?- We will follow blood and sputum cultures as mentioned above. ?- We will continue hydration with IV normal saline. ?

## 2021-07-17 NOTE — TOC Progression Note (Addendum)
Transition of Care (TOC) - Progression Note  ? ? ?Patient Details  ?Name: [name] ?MRN: [id number] ?Date of Birth: 26-Aug-1930 ? ?Transition of Care (TOC) CM/SW Contact  ?Zenon Mayo, RN ?Phone Number: ?07/17/2021, 4:14 PM ? ?Clinical Narrative:    ?From home, active with [facility] for [facility], [place], [place], here with CHF, CAP, Biliary drain displacement, sepsis secondary to pna. Palliative consulted, plan may be home with hospice or residential per palliative, they are to meet again with family.  TOC will continue to follow for dc needs.  ? ? ?  ?  ? ?Expected Discharge Plan and Services ?  ?  ?  ?  ?  ?                ?  ?  ?  ?  ?  ?  ?  ?  ?  ?  ? ? ?Social Determinants of Health (SDOH) Interventions ?  ? ?Readmission Risk Interventions ?No flowsheet data found. ? ?

## 2021-07-17 NOTE — ED Notes (Signed)
Urine culture sent down with urine sample 

## 2021-07-17 NOTE — ED Notes (Signed)
RN assisted with AM care and meds while primary RN with another pt. Pt resting in stretcher, Dr Doristine Bosworth rounding on pt at this time. ?

## 2021-07-17 NOTE — Progress Notes (Signed)
Request received for perc chole tube replacement.  ? ?Patient fell and accidentally dislodged perc chole tube.  ?Patient was seen by IR on 06/17/21 for routine exchange, and the tube was capped.  ?The plan was re-eval the patient in 6-8 weeks (mid-late March) to see if the perc chole tube can be removed.  ?Both patient and his daughter confirms that he has been tolerating the capping trial well, the drain remained capped all time. ? ?Today he denies fever, chills, abdominal pain, nausea, or vomiting.  ?Physical exam on abdomen unremarkable, no TTP on RUQ, negative Murphy's sign.  ? ?Patient is being admitted for sepsis, the source of infection most likely PNA, not cholecystitis.  ?VS stable, pt currently afebrile.  ?LFT normal, t bili was increased at 2.6 at 0057 hours today but has come down to 1.1.  ? ?Discussed with Dr. Dwaine Gale, will place the perc chole replacement on hold as patient does not show s/s of cholecystitis at the moment.  ?Discussed with patient and his daughter via telephone, both agree with the plan.  ? ?Attending MD notified via secure chat.  ?Will delete the IR rad eval order, please call IR for questions and concerns.  ? ? ?Tera Mater PA-C ?07/17/2021 10:34 AM ? ? ? ? ? ? ?

## 2021-07-18 ENCOUNTER — Inpatient Hospital Stay (HOSPITAL_COMMUNITY): Payer: Medicare Other

## 2021-07-18 DIAGNOSIS — I639 Cerebral infarction, unspecified: Secondary | ICD-10-CM

## 2021-07-18 DIAGNOSIS — Z7189 Other specified counseling: Secondary | ICD-10-CM

## 2021-07-18 DIAGNOSIS — D631 Anemia in chronic kidney disease: Secondary | ICD-10-CM

## 2021-07-18 DIAGNOSIS — N189 Chronic kidney disease, unspecified: Secondary | ICD-10-CM

## 2021-07-18 LAB — CBC WITH DIFFERENTIAL/PLATELET
Abs Immature Granulocytes: 0.12 10*3/uL — ABNORMAL HIGH (ref 0.00–0.07)
Basophils Absolute: 0 10*3/uL (ref 0.0–0.1)
Basophils Relative: 0 %
Eosinophils Absolute: 0.1 10*3/uL (ref 0.0–0.5)
Eosinophils Relative: 1 %
HCT: 14.8 % — ABNORMAL LOW (ref 39.0–52.0)
Hemoglobin: 4.8 g/dL — CL (ref 13.0–17.0)
Immature Granulocytes: 2 %
Lymphocytes Relative: 24 %
Lymphs Abs: 1.9 10*3/uL (ref 0.7–4.0)
MCH: 30.6 pg (ref 26.0–34.0)
MCHC: 32.4 g/dL (ref 30.0–36.0)
MCV: 94.3 fL (ref 80.0–100.0)
Monocytes Absolute: 0.6 10*3/uL (ref 0.1–1.0)
Monocytes Relative: 8 %
Neutro Abs: 5.1 10*3/uL (ref 1.7–7.7)
Neutrophils Relative %: 65 %
Platelets: 236 10*3/uL (ref 150–400)
RBC: 1.57 MIL/uL — ABNORMAL LOW (ref 4.22–5.81)
RDW: 24.3 % — ABNORMAL HIGH (ref 11.5–15.5)
WBC: 7.8 10*3/uL (ref 4.0–10.5)
nRBC: 1 % — ABNORMAL HIGH (ref 0.0–0.2)

## 2021-07-18 LAB — COMPREHENSIVE METABOLIC PANEL
ALT: 9 U/L (ref 0–44)
AST: 13 U/L — ABNORMAL LOW (ref 15–41)
Albumin: 2.8 g/dL — ABNORMAL LOW (ref 3.5–5.0)
Alkaline Phosphatase: 50 U/L (ref 38–126)
Anion gap: 9 (ref 5–15)
BUN: 35 mg/dL — ABNORMAL HIGH (ref 8–23)
CO2: 25 mmol/L (ref 22–32)
Calcium: 8.1 mg/dL — ABNORMAL LOW (ref 8.9–10.3)
Chloride: 98 mmol/L (ref 98–111)
Creatinine, Ser: 2.36 mg/dL — ABNORMAL HIGH (ref 0.61–1.24)
GFR, Estimated: 26 mL/min — ABNORMAL LOW (ref >60)
Glucose, Bld: 110 mg/dL — ABNORMAL HIGH (ref 70–99)
Potassium: 3.8 mmol/L (ref 3.5–5.1)
Sodium: 132 mmol/L — ABNORMAL LOW (ref 135–145)
Total Bilirubin: 0.8 mg/dL (ref 0.3–1.2)
Total Protein: 5.5 g/dL — ABNORMAL LOW (ref 6.5–8.1)

## 2021-07-18 LAB — LEGIONELLA PNEUMOPHILA SEROGP 1 UR AG: L. pneumophila Serogp 1 Ur Ag: NEGATIVE

## 2021-07-18 LAB — MAGNESIUM: Magnesium: 1.9 mg/dL (ref 1.7–2.4)

## 2021-07-18 LAB — GLUCOSE, CAPILLARY
Glucose-Capillary: 102 mg/dL — ABNORMAL HIGH (ref 70–99)
Glucose-Capillary: 168 mg/dL — ABNORMAL HIGH (ref 70–99)
Glucose-Capillary: 179 mg/dL — ABNORMAL HIGH (ref 70–99)
Glucose-Capillary: 168 mg/dL — ABNORMAL HIGH (ref 70–99)

## 2021-07-18 NOTE — Progress Notes (Signed)
Received phone call from PT regarding new R.side deficits. Pt answering question appropriately but not able to move R. Arm and R. Leg. VS taken. Blood Sugar 200. MD notified. Charge Nurse notified. Rapid RN notified. Code Stroke initiated. CT performed. See note. Q4 neuro exams ordered. Will continue to monitor. Will continue POC. ?

## 2021-07-18 NOTE — Code Documentation (Addendum)
Stroke Response Nurse Documentation ?Code Documentation ? ?[name] is a 88 y.o. male admitted to [facility] 07/17/2021 status post fall. Found to have sepsis due to community acquired pneumonia. Past medical history significant of post op atrial fibrillation, balanitis xerotica obliterans, jehovah's witness (no blood products), sleep apnea, CAD s/p CABG, essential hypertension, CHG, left MCA stroke with right sided residual (left dominant), hyperlipidemia, and diabetes. Baseline minimally able to walk with walker. Requires assistance with ADLs. On Plavix.  ? ?He was LKW at 1313 and now he is unable to move his right arm and leg. PT worked with him recently and shared concerns with Therapist, sports. Code stroke activated by bedside RN. ? ?Patient to CT with team. NIHSS 6, see documentation for details and code stroke times. Patient with right arm weakness and right leg weakness on exam. The following imaging was completed:  CT Head. Patient is not a candidate for IV Thrombolytic due to hemoglobin of 4.8 with refusal of blood products. Patient is not not a candidate for IR due to baseline functional status.  ? ?Care Plan: Q4 neuro checks.  ? ?Leverne Humbles ?Stroke Response RN ?  ?

## 2021-07-18 NOTE — Evaluation (Signed)
Physical Therapy Evaluation Patient Details Name: [name] MRN: [id number] DOB: 07-Sep-1930 Today's Date: 07/18/2021  History of Present Illness  Patient is a 88 y/o male who presents on 07/16/21 with fall and dislodging of choley drain as well as cough and SOB. Found to have sepsis due to CAP, acute on chronic CHF and severe anemia with Hbg 4.8. COVID +. PMH includes AAA, OSA, CAD s/p CABG, CVA, CHF, DM, HTN, indwelling foley, Jehovah's witness, cholecystitis with cholecystitis tube placed  Clinical Impression  Patient presents with new onset of right sided flaccidity, right lateral lean, language deficits, impaired problem solving, impaired balance and impaired mobility s/p above. Pt lives with daughter and son in law and has been working with HHPT and using RW for ambulation PTA. Today, pt requires Max A for bed mobility and Max A for sitting balance due to heavy right/posterior lateral lean. No active movement noted on right side. Spoke with daughter to get PLOF/history and found to have new deficits concerning for CVA. RN notified for code stroke. Pending medical course, pt may need SNF to maximize independence and mobility as well as ease burden of care prior to return home. If family decides to take pt home, will likely need hospital bed and possibly other DME. Will follow acutely.     Recommendations for follow up therapy are one component of a multi-disciplinary discharge planning process, led by the attending physician.  Recommendations may be updated based on patient status, additional functional criteria and insurance authorization.  Follow Up Recommendations Skilled nursing-short term rehab (<3 hours/day) (pending medical course)    Assistance Recommended at Discharge Frequent or constant Supervision/Assistance  Patient can return home with the following  Two people to help with walking and/or transfers;Two people to help with bathing/dressing/bathroom;Assist for transportation;Direct  supervision/assist for financial management;Assistance with cooking/housework;Direct supervision/assist for medications management    Equipment Recommendations Hospital bed  Recommendations for Other Services       Functional Status Assessment Patient has had a recent decline in their functional status and/or demonstrates limited ability to make significant improvements in function in a reasonable and predictable amount of time     Precautions / Restrictions Precautions Precautions: Fall Restrictions Weight Bearing Restrictions: No      Mobility  Bed Mobility Overal bed mobility: Needs Assistance Bed Mobility: Supine to Sit, Sit to Supine     Supine to sit: Max assist, HOB elevated, Total assist Sit to supine: Max assist, HOB elevated   General bed mobility comments: Able to bring LLE to EOB, assist with RLE, trunk and scooting bottom to EOB, right lateral and posterior lean.    Transfers                   General transfer comment: Unable    Ambulation/Gait                  Stairs            Wheelchair Mobility    Modified Rankin (Stroke Patients Only) Modified Rankin (Stroke Patients Only) Pre-Morbid Rankin Score: Moderately severe disability Modified Rankin: Severe disability     Balance Overall balance assessment: Needs assistance, History of Falls Sitting-balance support: Feet supported, No upper extremity supported Sitting balance-Leahy Scale: Zero Sitting balance - Comments: Max A for sitting EOB with right lateral and posterior lean Postural control: Posterior lean, Right lateral lean  Pertinent Vitals/Pain Pain Assessment Pain Assessment: Faces Faces Pain Scale: No hurt    Home Living Family/patient expects to be discharged to:: Private residence Living Arrangements: Children (with daughter and son in law) Available Help at Discharge: Family;Available 24 hours/day Type of Home:  House Home Access: Ramped entrance     Alternate Level Stairs-Number of Steps: flight Home Layout: Two level;Able to live on main level with bedroom/bathroom Home Equipment: Wheelchair - power;Wheelchair - Publishing copy (2 wheels);Rollator (4 wheels);BSC/3in1;Shower seat Additional Comments: two walker accessible bathrooms on main level that have high toilets, and either tub shower or walk in shower    Prior Function Prior Level of Function : Independent/Modified Independent;Needs assist             Mobility Comments: ambulates with use of rollator, for the last few days pt has been needing assist with transfers to w/c, has been working with HHPT and walking with assist. ADLs Comments: Ind with ADLs/selfcare, simple meal prep     Hand Dominance   Dominant Hand: Left    Extremity/Trunk Assessment   Upper Extremity Assessment Upper Extremity Assessment: RUE deficits/detail RUE Deficits / Details: No active movementt noted today, flaccid with shoulder subluxation (likely not new) RUE: Subluxation noted RUE Sensation: decreased light touch;decreased proprioception RUE Coordination: decreased gross motor;decreased fine motor    Lower Extremity Assessment Lower Extremity Assessment: RLE deficits/detail RLE Deficits / Details: No active purposeful movement of RLE RLE Sensation: decreased light touch;decreased proprioception RLE Coordination: decreased fine motor;decreased gross motor    Cervical / Trunk Assessment Cervical / Trunk Assessment: Other exceptions Cervical / Trunk Exceptions: right lateral lean  Communication   Communication: Receptive difficulties (new)  Cognition Arousal/Alertness: Awake/alert Behavior During Therapy: Flat affect Overall Cognitive Status: Difficult to assess Area of Impairment: Problem solving, Awareness                           Awareness: Intellectual Problem Solving: Slow processing, Decreased initiation, Difficulty  sequencing, Requires tactile cues, Requires verbal cues General Comments: Difficulty following commands- appears to have some receptive deficits. Slow processing. Flat affect.        General Comments General comments (skin integrity, edema, etc.): Spoke to daughter on the phone to get PLOF/history. Pt with new onset of right sided flaccidity, right lateral lean and language deficits. Concern for new stroke, RN made aware.    Exercises     Assessment/Plan    PT Assessment Patient needs continued PT services  PT Problem List Decreased range of motion;Decreased strength;Decreased mobility;Impaired tone;Decreased coordination;Impaired sensation;Decreased balance;Decreased cognition       PT Treatment Interventions Therapeutic exercise;Wheelchair mobility training;Balance training;Patient/family education;Therapeutic activities;Functional mobility training;Cognitive remediation;Neuromuscular re-education;DME instruction;Gait training;Manual techniques;Modalities    PT Goals (Current goals can be found in the Care Plan section)  Acute Rehab PT Goals Patient Stated Goal: none stated per patient PT Goal Formulation: Patient unable to participate in goal setting Time For Goal Achievement: 08/01/21 Potential to Achieve Goals: Fair    Frequency Min 3X/week     Co-evaluation               AM-PAC PT "6 Clicks" Mobility  Outcome Measure Help needed turning from your back to your side while in a flat bed without using bedrails?: Total Help needed moving from lying on your back to sitting on the side of a flat bed without using bedrails?: Total Help needed moving to and from a bed to a chair (including  a wheelchair)?: Total Help needed standing up from a chair using your arms (e.g., wheelchair or bedside chair)?: Total Help needed to walk in hospital room?: Total Help needed climbing 3-5 steps with a railing? : Total 6 Click Score: 6    End of Session   Activity Tolerance: Patient  tolerated treatment well Patient left: in bed;with call bell/phone within reach;with bed alarm set Nurse Communication: Mobility status;Other (comment) (RN made aware of new deficits- concern for new CVA) PT Visit Diagnosis: Hemiplegia and hemiparesis Hemiplegia - Right/Left: Right Hemiplegia - dominant/non-dominant: Non-dominant Hemiplegia - caused by: Unspecified    Time: 6222-9798 PT Time Calculation (min) (ACUTE ONLY): 22 min   Charges:   PT Evaluation $PT Eval Moderate Complexity: 1 Mod          Marisa Severin, PT, DPT Acute Rehabilitation Services Pager [phone] Office [phone]     Marguarite Arbour A Sabra Heck 07/18/2021, 4:16 PM

## 2021-07-18 NOTE — Progress Notes (Signed)
PROGRESS NOTE    [name]  YQI:[id number] DOB: Feb 27, 1931 DOA: 07/16/2021 PCP: Leonides Sake, MD   Brief Narrative:  [name] is a 88 y.o. Caucasian male with medical history significant for AAA, anemia, combined systolic and diastolic CHF, coronary artery disease status post CABG, essential hypertension, dyslipidemia with type 2 diabetes mellitus, who presented to the emergency room with a consult of Fall and subsequent biliary drain dislodgment.  The patient had a cholecystitis in October at which time he was not thought to be a good surgical candidate given his multiple medical problems and therefore a biliary drain by IR was placed and he was given IV antibiotics and has been stable since then.  About a couple weeks ago he fell from his chair but it was forceful enough to dislodge his biliary drain at that time.  He admitted to worsening cough over the last couple weeks occasionally productive of brownish sputum, with mild dyspnea or wheezing.  No nausea or vomiting or abdominal pain.  No chest pain or palpitations.  His suprapubic catheter has been draining normally.   ED Course: Upon presentation to the emergency room, vital signs were within normal.  BMP showed a BUN of 31 and creatinine 2.46 close to baseline.  BNP in the PE was 988.9 and high-sensitivity troponin was 22.  Lactic acid was 1.5 and CBC showed anemia with hemoglobin of 6.4 and hematocrit of 18.8 comparable to a month ago. EKG as reviewed by me : Pending Imaging: Portable chest ray showed small right pleural effusion and bibasilar atelectasis.  The patient was given IV Rocephin and Zithromax was 6 and 50 mg of p.o. Tylenol.  He will be admitted to a cardiac telemetry bed for further evaluation and management  Assessment & Plan:   Active Problems:   Diabetes mellitus type 2 in obese (HCC)   Hyperlipidemia associated with type 2 diabetes mellitus ([place])   Coronary artery disease involving native coronary artery of  native heart with angina pectoris [facility])   Essential hypertension   Coronary artery disease involving coronary bypass graft of native heart with angina pectoris ([place])   Anemia due to chronic kidney disease   Acute on chronic systolic CHF (congestive heart failure) ([place])   Community acquired pneumonia   Biliary drain displacement   Sepsis due to pneumonia ([place])  Acute hypoxic respiratory failure secondary to Acute on chronic systolic CHF, POA: Echo done in December 2022 shows 45 to 50% ejection fraction with global hypokinesis.  Currently requiring 2 L of oxygen.  Chest x-ray shows small right pleural effusion.  BNP elevated more than baseline.  Has had good diuresis.  Continue I will diuretics and strict I's and O's with daily weight.  Sepsis secondary to Healthcare associated pneumonia: Patient qualifies for healthcare associated pneumonia diagnosis since he was admitted in the hospital for more than 3 days within the last 90 days.  He meets sepsis criteria based on fever, tachycardia and tachypnea.  Cultures are negative so far.  Continue cefepime.  Procalcitonin elevated.  Will recheck tomorrow.  Biliary drain displacement: Consulted IR.  They assessed patient.  Per them, they were going to remove the drain in the middle of March and that currently patient does not have any symptoms regarding acute cholecystitis so they will leave that out for now.  Monitor LFTs.  COVID-19 infection: Patient has tested positive this admission but it appears that he was also positive about 2 months ago so this is likely continuation  of the previous positive test.  No indication of treatment for COVID-19 at this point in time.  Discussed with primary RN this morning around 9 AM and requested her to reach out to infection control to find out CT score to see if we still need to continue droplet isolation.   Diabetes mellitus type 2 in obese [facility])- (present on admission): Hemoglobin A1c 6.9.  He is only on metformin at  home which is on hold.  Continue SSI   Anemia due to chronic kidney disease: Patient's baseline hemoglobin is 6.  Patient is Jehovah's Witness.  Hemoglobin dropped to 5.2 yesterday and further down to 4.8 today.  Discussed in length with the patient and his daughter and they are declining blood transfusion.  They were made aware by me and have been made aware by many physicians in the past that this sort of severe anemia can be very dangerous and can cause death.  They verbalized understanding and still declined blood transfusion.  Essential hypertension: Controlled.  Continue home medications.   Coronary artery disease involving native coronary artery of native heart with angina pectoris ([place])- (present on admission): Asymptomatic - We will continue Plavix, Imdur, as needed sublingual nitroglycerin, Coreg and statin therapy.   Hyperlipidemia: Continue statin.  DVT prophylaxis: enoxaparin (LOVENOX) injection 30 mg Start: 07/18/21 1400   Code Status: DNR  Family Communication:  None present at bedside.  Discussed with daughter over the phone.  Status is: Inpatient Remains inpatient appropriate because: He needs inpatient management.   Estimated body mass index is 28.16 kg/m as calculated from the following:   Height as of this encounter: 6\' 1"  (1.854 m).   Weight as of this encounter: 96.8 kg.  Pressure Injury 05/05/21 Buttocks Right Stage 1 -  Intact skin with non-blanchable redness of a localized area usually over a bony prominence. small open area on right buttocks (Active)  05/05/21 1700  Location: Buttocks  Location Orientation: Right  Staging: Stage 1 -  Intact skin with non-blanchable redness of a localized area usually over a bony prominence.  Wound Description (Comments): small open area on right buttocks  Present on Admission:    Nutritional Assessment: Body mass index is 28.16 kg/m.Marland Kitchen Seen by dietician.  I agree with the assessment and plan as outlined below: Nutrition  Status:        . Skin Assessment: I have examined the patient's skin and I agree with the wound assessment as performed by the wound care RN as outlined below: Pressure Injury 05/05/21 Buttocks Right Stage 1 -  Intact skin with non-blanchable redness of a localized area usually over a bony prominence. small open area on right buttocks (Active)  05/05/21 1700  Location: Buttocks  Location Orientation: Right  Staging: Stage 1 -  Intact skin with non-blanchable redness of a localized area usually over a bony prominence.  Wound Description (Comments): small open area on right buttocks  Present on Admission:     Consultants:  None  Procedures:  None  Antimicrobials:  Anti-infectives (From admission, onward)    Start     Dose/Rate Route Frequency Ordered Stop   07/18/21 0400  azithromycin (ZITHROMAX) 500 mg in sodium chloride 0.9 % 250 mL IVPB        500 mg 250 mL/hr over 60 Minutes Intravenous Every 24 hours 07/17/21 0512 07/22/21 0359   07/18/21 0000  cefTRIAXone (ROCEPHIN) 2 g in sodium chloride 0.9 % 100 mL IVPB  Status:  Discontinued  2 g 200 mL/hr over 30 Minutes Intravenous Every 24 hours 07/17/21 0512 07/17/21 1607   07/17/21 1800  ceFEPIme (MAXIPIME) 2 g in sodium chloride 0.9 % 100 mL IVPB        2 g 200 mL/hr over 30 Minutes Intravenous Every 24 hours 07/17/21 1656     07/17/21 0500  cefTRIAXone (ROCEPHIN) 2 g in sodium chloride 0.9 % 100 mL IVPB  Status:  Discontinued        2 g 200 mL/hr over 30 Minutes Intravenous Every 24 hours 07/17/21 0452 07/17/21 0512   07/17/21 0500  azithromycin (ZITHROMAX) 500 mg in sodium chloride 0.9 % 250 mL IVPB  Status:  Discontinued        500 mg 250 mL/hr over 60 Minutes Intravenous Every 24 hours 07/17/21 0452 07/17/21 0512   07/17/21 0130  cefTRIAXone (ROCEPHIN) 2 g in sodium chloride 0.9 % 100 mL IVPB        2 g 200 mL/hr over 30 Minutes Intravenous  Once 07/17/21 0128 07/17/21 0318   07/17/21 0130  azithromycin (ZITHROMAX)  500 mg in sodium chloride 0.9 % 250 mL IVPB  Status:  Discontinued        500 mg 250 mL/hr over 60 Minutes Intravenous Every 24 hours 07/17/21 0128 07/17/21 0527         Subjective:  Patient seen and examined.  He has no complaints.  He is fully alert and oriented.  Eating breakfast.  Objective: Vitals:   07/18/21 0602 07/18/21 0855 07/18/21 1115 07/18/21 1117  BP: (!) 111/59 123/67  129/62  Pulse: 81 77  83  Resp: 20 17  17   Temp: 97.7 F (36.5 C) 98.3 F (36.8 C)    TempSrc: Oral Oral  Oral  SpO2: 96% 95% 96% 96%  Weight:      Height:        Intake/Output Summary (Last 24 hours) at 07/18/2021 1244 Last data filed at 07/18/2021 2440 Gross per 24 hour  Intake --  Output 1500 ml  Net -1500 ml    Filed Weights   07/17/21 0500 07/18/21 0037  Weight: 94.8 kg 96.8 kg    Examination:  General exam: Appears calm and comfortable  Respiratory system: Bibasilar crackles and bilateral rhonchi. Respiratory effort normal. Cardiovascular system: S1 & S2 heard, RRR. No JVD, murmurs, rubs, gallops or clicks. No pedal edema. Gastrointestinal system: Abdomen is nondistended, soft and nontender. No organomegaly or masses felt. Normal bowel sounds heard. Central nervous system: Alert and oriented. No focal neurological deficits. Extremities: Symmetric 5 x 5 power. Skin: No rashes, lesions or ulcers.  Psychiatry: Judgement and insight appear normal. Mood & affect appropriate.   Data Reviewed: I have personally reviewed following labs and imaging studies  CBC: Recent Labs  Lab 07/17/21 0057 07/17/21 0700 07/18/21 0423  WBC 13.3* 17.1* 7.8  NEUTROABS 9.7*  --  5.1  HGB 6.4* 5.2* 4.8*  HCT 18.8* 15.9* 14.8*  MCV 94.5 95.2 94.3  PLT 372 283 102    Basic Metabolic Panel: Recent Labs  Lab 07/17/21 0057 07/17/21 0700 07/18/21 0423  NA 135 136 132*  K 3.8 3.8 3.8  CL 99 99 98  CO2 24 26 25   GLUCOSE 193* 144* 110*  BUN 31* 31* 35*  CREATININE 2.46* 2.47* 2.36*  CALCIUM  8.7* 8.3* 8.1*  MG  --   --  1.9    GFR: Estimated Creatinine Clearance: 26.5 mL/min (A) (by C-G formula based on SCr of 2.36 mg/dL (H)).  Liver Function Tests: Recent Labs  Lab 07/17/21 0057 07/17/21 0700 07/18/21 0423  AST 16 18 13*  ALT 10 10 9   ALKPHOS 61 52 50  BILITOT 2.6* 1.1 0.8  PROT 6.6 5.6* 5.5*  ALBUMIN 3.7 3.1* 2.8*    No results for input(s): LIPASE, AMYLASE in the last 168 hours. No results for input(s): AMMONIA in the last 168 hours. Coagulation Profile: No results for input(s): INR, PROTIME in the last 168 hours. Cardiac Enzymes: No results for input(s): CKTOTAL, CKMB, CKMBINDEX, TROPONINI in the last 168 hours. BNP (last 3 results) No results for input(s): PROBNP in the last 8760 hours. HbA1C: Recent Labs    07/17/21 0701  HGBA1C 6.9*    CBG: Recent Labs  Lab 07/17/21 1154 07/17/21 1715 07/17/21 2049 07/18/21 0604 07/18/21 1115  GLUCAP 139* 111* 168* 102* 168*    Lipid Profile: No results for input(s): CHOL, HDL, LDLCALC, TRIG, CHOLHDL, LDLDIRECT in the last 72 hours. Thyroid Function Tests: No results for input(s): TSH, T4TOTAL, FREET4, T3FREE, THYROIDAB in the last 72 hours. Anemia Panel: No results for input(s): VITAMINB12, FOLATE, FERRITIN, TIBC, IRON, RETICCTPCT in the last 72 hours. Sepsis Labs: Recent Labs  Lab 07/17/21 0057 07/17/21 0257 07/17/21 1646  PROCALCITON  --   --  3.53  LATICACIDVEN 1.5 1.2  --      Recent Results (from the past 240 hour(s))  Blood culture (routine x 2)     Status: None (Preliminary result)   Collection Time: 07/17/21  1:40 AM   Specimen: BLOOD  Result Value Ref Range Status   Specimen Description BLOOD LEFT ANTECUBITAL  Final   Special Requests   Final    BOTTLES DRAWN AEROBIC AND ANAEROBIC Blood Culture adequate volume   Culture   Final    NO GROWTH 1 DAY Performed at [facility], [address], [place], [place] [address]    Report Status PENDING  Incomplete  Blood culture  (routine x 2)     Status: None (Preliminary result)   Collection Time: 07/17/21  1:46 AM   Specimen: BLOOD  Result Value Ref Range Status   Specimen Description BLOOD BLOOD LEFT WRIST  Final   Special Requests   Final    BOTTLES DRAWN AEROBIC AND ANAEROBIC Blood Culture adequate volume   Culture   Final    NO GROWTH 1 DAY Performed at [facility], [place] [address] [place], [place] [address]    Report Status PENDING  Incomplete  Resp Panel by RT-PCR (Flu A&B, Covid) Nasopharyngeal Swab     Status: Abnormal   Collection Time: 07/17/21 11:54 AM   Specimen: Nasopharyngeal Swab; Nasopharyngeal(NP) swabs in vial transport medium  Result Value Ref Range Status   SARS Coronavirus 2 by RT PCR POSITIVE (A) NEGATIVE Final    Comment: (NOTE) SARS-CoV-2 target nucleic acids are DETECTED.  The SARS-CoV-2 RNA is generally detectable in upper respiratory specimens during the acute phase of infection. Positive results are indicative of the presence of the identified virus, but do not rule out bacterial infection or co-infection with other pathogens not detected by the test. Clinical correlation with patient history and other diagnostic information is necessary to determine patient infection status. The expected result is Negative.  Fact Sheet for Patients: EntrepreneurPulse.com.au  Fact Sheet for Healthcare Providers: IncredibleEmployment.be  This test is not yet approved or cleared by the [place] FDA and  has been authorized for detection and/or diagnosis of SARS-CoV-2 by FDA under an Emergency  Use Authorization (EUA).  This EUA will remain in effect (meaning this test can be used) for the duration of  the COVID-19 declaration under Section 564(b)(1) of the A ct, 21 U.S.C. section 360bbb-3(b)(1), unless the authorization is terminated or revoked sooner.     Influenza A by PCR NEGATIVE NEGATIVE Final   Influenza B by PCR NEGATIVE NEGATIVE  Final    Comment: (NOTE) The Xpert Xpress SARS-CoV-2/FLU/RSV plus assay is intended as an aid in the diagnosis of influenza from Nasopharyngeal swab specimens and should not be used as a sole basis for treatment. Nasal washings and aspirates are unacceptable for Xpert Xpress SARS-CoV-2/FLU/RSV testing.  Fact Sheet for Patients: EntrepreneurPulse.com.au  Fact Sheet for Healthcare Providers: IncredibleEmployment.be  This test is not yet approved or cleared by the [place] FDA and has been authorized for detection and/or diagnosis of SARS-CoV-2 by FDA under an Emergency Use Authorization (EUA). This EUA will remain in effect (meaning this test can be used) for the duration of the COVID-19 declaration under Section 564(b)(1) of the Act, 21 U.S.C. section 360bbb-3(b)(1), unless the authorization is terminated or revoked.  Performed at [facility], [place] [address], [place], [place] [address]       Radiology Studies: DG Chest Portable 1 View  Result Date: 07/17/2021 CLINICAL DATA:  EVA EXAM: PORTABLE CHEST 1 VIEW COMPARISON:  04/29/2021 FINDINGS: Mild cardiomegaly. Small right pleural effusion and bibasilar atelectasis. Remote median sternotomy. IMPRESSION: Small right pleural effusion and bibasilar atelectasis. Electronically Signed   By: Ulyses Jarred M.D.   On: 07/17/2021 02:30    Scheduled Meds:  amLODipine  10 mg Oral Q1500   ascorbic acid  500 mg Oral Q1400   carvedilol  6.25 mg Oral BID WC   clopidogrel  75 mg Oral Q1400   doxazosin  4 mg Oral Q1400   enoxaparin (LOVENOX) injection  30 mg Subcutaneous Q24H   ferrous sulfate  325 mg Oral [id number]   folic acid  1 mg Oral Daily   furosemide  40 mg Intravenous BID   guaiFENesin  600 mg Oral BID   hydrALAZINE  100 mg Oral BID   insulin aspart  0-9 Units Subcutaneous TID AC & HS   isosorbide mononitrate  60 mg Oral BID   oxybutynin  10 mg Oral Daily   pantoprazole  40 mg Oral Q1400    saccharomyces boulardii  250 mg Oral BID   sodium bicarbonate  650 mg Oral BID   sodium chloride flush  3 mL Intravenous Q12H   sodium chloride flush  5 mL Intracatheter Q12H   cyanocobalamin  1,000 mcg Oral Q1400   zinc sulfate  220 mg Oral Q1400   Continuous Infusions:  sodium chloride     azithromycin 500 mg (07/18/21 0453)   ceFEPime (MAXIPIME) IV 2 g (07/17/21 1751)     LOS: 1 day   Time spent: 30 minutes  Darliss Cheney, MD [facility]  07/18/2021, 12:44 PM  Please page via [facility] and do not message via secure chat for urgent patient care matters. Secure chat can be used for non urgent patient care matters.  How to contact the [facility] Attending or Consulting provider [place] or covering provider during after hours [place], for this patient?  Check the care team in [facility] and look for a) attending/consulting [facility] provider listed and b) the [facility] team listed. Page or secure chat [phone]. Log into [email or web address] and use [place]'s universal password to access. If you  do not have the password, please contact the hospital operator. Locate the [facility] provider you are looking for under [facility] and page to a number that you can be directly reached. If you still have difficulty reaching the provider, please page the [facility] (Director on Call) for the Hospitalists listed on amion for assistance.

## 2021-07-18 NOTE — Progress Notes (Signed)
Code stroke was called earlier today.  Patient was attended by neurologist right away.  When I arrived, patient was already down for CT head.  When patient returned, I went to see patient again.  Patient was fully alert and oriented however he continued to have right-sided flaccid paralysis.  CT head is resulted now and does not show any acute stroke.  Will defer to neurology for further work-up.  Neurology has already updated the daughter as well. ?

## 2021-07-18 NOTE — Consult Note (Addendum)
? ?                                                                                ?Consultation Note ?Date: 07/18/2021  ? ?Patient Name: [name]  ?DOB: 06/26/1930  MRN: [id number]  Age / Sex: 88 y.o., male  ?PCP: [name] Sake, MD ?Referring Physician: Darliss Cheney, MD ? ?Reason for Consultation: GOC.  Severe anemia.  Refusing transfusion.  Jehovah's Witness. ? ?HPI/Patient Profile: 88 y.o. male  with past medical history of  Congestive heart failure last EF 45 to 50%, CVA, CAD, CABG, chronic anemia-he is a Jehovah's Witness declines blood transfusions, AAA, hypertension, HLD, cholecystitis with cholecystitis tube placed DM, admitted on 07/16/2021 after a fall at home in which he dislodged his chole drain (it was planned to come out in March, no plans to replace due to no s/s of continuing cholecystitis).  He was also found to be dyspneic and with a cough over the last few weeks.  WBC showed white count up at 17.1 anemia at 6.4.  Determined to be septic from healthcare acquired pneumonia.  Also with acute on chronic heart failure exacerbation.  Admitted for diuresis and IV antibiotics.  Palliative medicine consulted for goals of care due to his ongoing severe anemia hemoglobin today was 4.8.  He he declines blood transfusion products due to his [name] tradition of Jehovah's Witness. ? ?Primary Decision Maker ?NEXT OF KIN -daughter [name] ? ?Discussion: ?I have reviewed medical records including EPIC notes, labs and imaging, assessed the patient and then spoke with his daughter, [name] via phone to discuss diagnosis prognosis, [place], EOL wishes, disposition and options. ? ?[name] met with my partner [name] during his last admission at that time a MOST form was completed-  ? ?Cardiopulmonary Resuscitation: Do Not Attempt Resuscitation (DNR/No CPR)  ?Medical Interventions: Limited Additional Interventions: Use medical treatment, IV fluids and cardiac monitoring as indicated, DO NOT USE intubation  or mechanical ventilation. May consider use of less invasive airway support such as BiPAP or CPAP. Also provide comfort measures. Transfer to the hospital if indicated. Avoid intensive care.   ?Antibiotics: Antibiotics if indicated  ?IV Fluids: IV fluids if indicated  ?Feeding Tube: Feeding tube for a defined trial period  ? ? ?As far as functional and nutritional status-since his last admission [name] shares that he has fallen a few times.  He had some bruised ribs and subsequently developed this pneumonia.  [name] has seen an overall decline in his functional and cognitive status. ? ? We discussed patient's current illness and what it means in the larger context of patient's on-going co-morbidities. Natural disease trajectory and expectations at EOL were discussed.  We discussed the difficulties in recovery especially in the setting of his ongoing anemia. ? ?[name] shares that he did state to her last week that he just wishes he could go to sleep and not wake up. ? ?The difference between aggressive medical intervention and comfort care was explained and considered in light of the patient's goals of care.  ? ?Discussed with patient/family the importance of continued conversation with family and the medical providers regarding overall plan of care and treatment options, ensuring  decisions are within the context of the patient?s values and GOCs.   ? ?Hospice and Palliative Care services outpatient were explained and offered. ? ?Questions and concerns were addressed. The family was encouraged to call with questions or concerns.  ? ?  ? ?SUMMARY OF RECOMMENDATIONS ?-HCAP, sepsis, heart failure exacerbation-discussed ongoing treatment with IV antibiotics and IV diuresis versus transition to comfort measures only-if she wishes to continue with current treatments then he would be eligible to go home with hospice, however if she wishes to transition to full comfort measures now then he would likely be eligible for  inpatient hospice. ?-[name] plans to consider these options ?-PMT will follow up with her tomorrow ? ?Code Status/Advance Care Planning: ?DNR ? ? ?Prognosis:   ?Unable to determine ? ?Discharge Planning: To Be Determined ? ?Primary Diagnoses: ?Present on Admission: ? Acute on chronic systolic CHF (congestive heart failure) ([place]) ? Diabetes mellitus type 2 in obese [facility]) ? Hyperlipidemia associated with type 2 diabetes mellitus ([place]) ? Coronary artery disease involving coronary bypass graft of native heart with angina pectoris ([place]) ? Coronary artery disease involving native coronary artery of native heart with angina pectoris ([place]) ? ? ?Review of Systems ? ?Physical Exam ? ?Vital Signs: BP 129/62 (BP Location: Left Arm)   Pulse 83   Temp 98.3 ?F (36.8 ?C) (Oral)   Resp 17   Ht $R'6\' 1"'ZW$  (1.854 m)   Wt 96.8 kg   SpO2 96%   BMI 28.16 kg/m?  ?Pain Scale: 0-10 ?  ?Pain Score: 0-No pain ? ? ?SpO2: SpO2: 96 % ?O2 Device:SpO2: 96 % ?O2 Flow Rate: .O2 Flow Rate (L/min): 2 L/min ? ?IO: Intake/output summary:  ?Intake/Output Summary (Last 24 hours) at 07/18/2021 1508 ?Last data filed at 07/18/2021 6967 ?Gross per 24 hour  ?Intake --  ?Output 1500 ml  ?Net -1500 ml  ? ? ?LBM:   ?Baseline Weight: Weight: 94.8 kg ?Most recent weight: Weight: 96.8 kg     ? ? ?Thank you for this consult. Palliative medicine will continue to follow and assist as needed.  ? ?Signed by: ?[name], AGNP-C ?Palliative Medicine ? ?  ?Please contact Palliative Medicine Team phone at [phone] for questions and concerns.  ?For individual provider: See Amion ? ? ? ? ? ? ? ? ? ? ? ? ? ? ?

## 2021-07-18 NOTE — Consult Note (Addendum)
Neurology Code Stroke Consult H&P  [name] MR# [id number] 07/18/2021   CC: 88 year old male patient admitted after a fall and found to have sepsis whose right sided weakness turned into hemiplegia and a code stroke was called. He was on Plavix at home per daughter.  History is obtained from: patient, daughter [name] and chart.  HPI: [name] is a 88 y.o. male PMHx that includes Afib, DM, CAD, CHF, HLD. Per daughter he has been mostly w/c bound or walks a little with walker with family walking behind with a wheel chair. At baseline is unable to care for his finances, groceries nor his ADLs. He has some R sided weakness from prior strokes but is able to lift his arm up.  He had plegia of his RUE and RLE and thus a stroke code was activated.   LKW: 1330 tNK given: No contraindicated with decreasing hemoglobin that now stands at 4.8. IR Thrombectomy No not offered due to poor baseline, contraindicated with hgb 4.8 and risk of hemorrhage with thrombectomy is 10-15%  Prior to admission Modified Rankin Scale: 4-Needs assistance to walk and tend to bodily needs NIHSS:  Interval: Initial (03/03 1600) Level of Consciousness (1a.)   : Alert, keenly responsive (03/03 1600) LOC Questions (1b. )   +: Answers both questions correctly (03/03 1600) LOC Commands (1c. )   + : Performs both tasks correctly (03/03 1600) Best Gaze (2. )  +: Normal (03/03 1600) Visual (3. )  +: Partial hemianopia (03/03 1600) Facial Palsy (4. )    : Normal symmetrical movements (03/03 1600) Motor Arm, Left (5a. )   +: No drift (03/03 1600) Motor Arm, Right (5b. )   +: No effort against gravity (03/03 1600) Motor Leg, Left (6a. )   +: No drift (03/03 1600) Motor Leg, Right (6b. )   +: No effort against gravity (03/03 1600) Limb Ataxia (7. ): Absent (03/03 1600) Sensory (8. )   +: Normal, no sensory loss (03/03 1600) Best Language (9. )   +: No aphasia (03/03 1600) Dysarthria (10. ): Normal (03/03  1600) Extinction/Inattention (11.)   +: No Abnormality (03/03 1600) Modified SS Total  +: 7 (03/03 1600) Complete NIHSS TOTAL: 7 (03/03 1600)   ROS: A complete ROS was performed and is negative except as noted in the HPI.    Past Medical History:  Diagnosis Date   Abdominal aortic aneurysm    a. Aortic duplex 06/2014: mild aneurysmal dilatation of proximal abdominal aorta measuring 3.4x3.4cm. No sig change from 2012. 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 Patent LIMA to LAD. Acutely occluded SVG to PDA within previously placed stent. 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 (03/26/2016)   Stenosis of left carotid artery    Suprapubic catheter ([place])    Vitamin D deficiency 05/27/2018     Family History   Problem Relation Age of Onset   Heart Problems Father 74    Social History:  reports that he quit smoking about 62 years ago. His smoking use included cigarettes. He has a 3.30 pack-year smoking history. He has never used smokeless tobacco. He reports that he does not drink alcohol and does not use drugs.   Prior to Admission medications   Medication Sig Start Date End Date Taking? Authorizing Provider  acetaminophen (TYLENOL) 325 MG tablet Take 2 tablets (650 mg total) by mouth every 6 (six) hours as needed for mild pain (or Fever >/= 101). Patient taking differently: Take 325 mg by mouth every 6 (six) hours as needed for mild pain or headache (or Fever >/= 101). 05/31/18  Yes Angiulli, Lavon Paganini, PA-C  albuterol (VENTOLIN HFA) 108 (90 Base) MCG/ACT inhaler Inhale 2 puffs into the lungs every 2 (two) hours as needed for wheezing or shortness of breath. 05/27/21  Yes Angiulli, Lavon Paganini, PA-C  amLODipine (NORVASC) 10 MG tablet Take 1 tablet (10 mg total) by mouth daily in the afternoon. 05/27/21  Yes Angiulli, Lavon Paganini, PA-C  ascorbic acid (VITAMIN C) 500 MG tablet Take 1 tablet (500 mg total) by mouth daily. Patient taking differently: Take 500 mg by mouth daily at 2 PM. 05/27/21  Yes Angiulli, Lavon Paganini, PA-C  carvedilol (COREG) 6.25 MG tablet Take 1 tablet (6.25 mg total) by mouth 2 (two) times daily with a meal. 05/27/21  Yes Angiulli, Lavon Paganini, PA-C  clopidogrel (PLAVIX) 75 MG tablet Take 1 tablet (75 mg total) by mouth daily. Patient taking differently: Take 75 mg by mouth daily at 2 PM. 05/27/21  Yes Angiulli, Lavon Paganini, PA-C  cyanocobalamin 1000 MCG tablet Take 1 tablet (1,000 mcg total) by mouth daily. Patient taking differently: Take 1,000 mcg by mouth daily at 2 PM. 05/27/21  Yes Angiulli, Lavon Paganini, PA-C  doxazosin (CARDURA) 4 MG tablet Take 1 tablet (4 mg total) by mouth daily. Patient taking differently: Take 4 mg by mouth daily at 2 PM. 05/27/21  Yes Angiulli, Lavon Paganini, PA-C  ferrous  sulfate 325 (65 FE) MG tablet Take 1 tablet (325 mg total) by mouth daily. Patient taking differently: Take 325 mg by mouth daily at 2 PM. 05/27/21  Yes Angiulli, Lavon Paganini, PA-C  folic acid (FOLVITE) 767 MCG tablet Take 400 mcg by mouth daily at 2 PM.   Yes [provider]  furosemide (LASIX) 20 MG tablet Take 1 tablet (20 mg total) by mouth as needed. Patient taking differently: Take 20 mg by mouth daily. May give 1 extra tab in the afternoon as needed for swelling 06/30/21  Yes Raulkar, Clide Deutscher, MD  guaiFENesin (MUCINEX) 600 MG 12 hr tablet Take 1 tablet (600 mg total) by mouth 2 (two) times daily. 05/27/21  Yes Angiulli, Lavon Paganini, PA-C  hydrALAZINE (APRESOLINE) 100 MG tablet Take 1 tablet (100 mg total)  by mouth 2 (two) times daily. 05/27/21  Yes Angiulli, Lavon Paganini, PA-C  hydrALAZINE (APRESOLINE) 25 MG tablet Take 25 mg by mouth daily at 2 PM. 03/06/21  Yes [provider]  isosorbide mononitrate (IMDUR) 60 MG 24 hr tablet Take 1 tablet (60 mg total) by mouth 2 (two) times daily. 05/27/21  Yes Angiulli, Lavon Paganini, PA-C  metFORMIN (GLUCOPHAGE) 500 MG tablet Take 1 tablet (500 mg total) by mouth 2 (two) times daily with a meal. 05/27/21  Yes Angiulli, Lavon Paganini, PA-C  nitroGLYCERIN (NITROSTAT) 0.4 MG SL tablet Place 0.4 mg under the tongue every 5 (five) minutes as needed for chest pain. 03/27/19  Yes [provider]  oxybutynin (DITROPAN) 5 MG tablet Take 5 mg by mouth daily at 2 PM.   Yes [provider]  pantoprazole (PROTONIX) 40 MG tablet Take 1 tablet (40 mg total) by mouth daily. Patient taking differently: Take 40 mg by mouth daily at 2 PM. 05/27/21  Yes Angiulli, Lavon Paganini, PA-C  saccharomyces boulardii (FLORASTOR) 250 MG capsule Take 1 capsule (250 mg total) by mouth 2 (two) times daily. 05/27/21  Yes Angiulli, Lavon Paganini, PA-C  sodium bicarbonate 650 MG tablet Take 1 tablet (650 mg total) by mouth 2 (two) times daily. 05/27/21  Yes Angiulli, Lavon Paganini, PA-C  sodium  chloride flush 0.9 % SOLN injection 5 mLs by Intracatheter route every 12 (twelve) hours. 04/02/21 09/29/21 Yes [provider]  Zinc Sulfate 220 (50 Zn) MG TABS Take 1 tablet (220 mg total) by mouth daily. Patient taking differently: Take 220 mg by mouth daily at 2 PM. 05/27/21  Yes Angiulli, Lavon Paganini, PA-C  folic acid (FOLVITE) 1 MG tablet Take 1 tablet (1 mg total) by mouth daily. Patient not taking: Reported on 07/17/2021 05/27/21   Angiulli, Lavon Paganini, PA-C  oxybutynin (DITROPAN-XL) 10 MG 24 hr tablet Take 10 mg by mouth daily. 06/25/21   [provider]  polyethylene glycol (MIRALAX / GLYCOLAX) 17 g packet Take 17 g by mouth daily. Patient not taking: Reported on 07/17/2021 05/27/21   Angiulli, Lavon Paganini, PA-C    Exam: Current vital signs: BP (!) 104/51 (BP Location: Left Arm)    Pulse 83    Temp 98.3 F (36.8 C) (Oral)    Resp 18    Ht 6\' 1"  (1.854 m)    Wt 96.8 kg    SpO2 96%    BMI 28.16 kg/m   Physical Exam  Constitutional: Appears well-developed and well-nourished.  Psych: Affect appropriate to situation Eyes: No scleral injection HENT: No OP obstruction. Head: Normocephalic.  Cardiovascular: Normal rate and regular rhythm.  Respiratory: Effort normal, symmetric excursions bilaterally, no audible wheezing. GI: Soft.  No distension. There is no tenderness.  Skin: WDI  Neuro: Mental Status: Patient is awake, alert, oriented to person, place, month, year, and situation. Patient is unable to give a clear and coherent history.Spoke with Daughter [name] on phone for hx Speech is fluent, intact comprehension and repetition. No signs of aphasia or neglect. Visual Fields are full. Pupils are equal, round, and reactive to light. EOMI without ptosis or diploplia.  Facial sensation is symmetric to temperature Facial movement is symmetric.  Hearing is intact to voice. Uvula midline and palate elevates symmetrically. Shoulder shrug is not symmetric with right sided  hemiparesis Tongue is midline without atrophy or fasciculations.  Tone is normal. Bulk is normal. 3+/5 strength was present in all four extremities except  Sensation is not symmetric to light  touch and temperature in the arms and legs. Toes are downgoing bilaterally. Gait - Deferred  I have reviewed labs in epic and the pertinent results are: Sodium- 132, BUN 35 and creatinine 2.36 Hemoglobin, 4.8, Mag 1.9  I have reviewed the images obtained: NCT head showed No acute intracranial hemorrhage or infarct.ASPECTS is 10.Stable remote infarcts on a background of parenchymal volume loss and chronic white matter microangiopathy as above.  Assessment: [name] is a 88 y.o. male PMHx fib, DM, CAD, CHF, HLD. Per daughter he has been w/c bound since Oct/Nov and at baseline is unable to care for his finances, groceries nor his ADLs.  He was admitted to [facility] with sepsis and today RN noted worsened right sided weakness and a code stroke was called. Daughter made aware patient is not a candidate for TNK/Thrombectomy if indicated. Patient made a DNR/DNI today after [place] talk with Palliative. Of note patient has severe anemia and is refusing transfusion due to being a Jehovah's Witness.  Impression:  Embolic appearing stable remote infarcts on a background of parenchymal volume loss and chronic white matter microangiopathy.  Plan: - MRI brain without contrast. - Recommend TTE. - Recommend labs: HbA1c, lipid panel, TSH. - Recommend Statin if LDL > 70 -Hold antiplatelets and anticoagulation for low hgb - Permissive hypertension first 24 h < 220/110.  - Telemetry monitoring for arrhythmia. - Recommend bedside Swallow screen. - Recommend Stroke education. - Recommend PT/OT/SLP consult.   Electronically signed by:  Parke Poisson, Neuro NP  Available via Epic  07/18/2021, 4:37 PM  If 7pm- 7am, please page neurology on call as listed in [place].   NEUROHOSPITALIST ADDENDUM Performed a face to  face diagnostic evaluation.   I have reviewed the contents of history and physical exam as documented by PA/ARNP/Resident and agree with above documentation.  I have discussed and formulated the above plan as documented. Edits to the note have been made as needed.  Impression/Key exam findings/Plan: admitted with sepsis and worsening anemia with Hb of 4.8. He and family have respectfully declined any blood transfusion due to their religious belief. He has had prior strokes with some RUE weakness but is able to lift his arm off bed at baseline and walks very little with walker with family behind him carrying his wheel chair. He needs help to tend to his bodily needs.  He had sudden onset worsening of R sided weakness and has RUE and RLE flaccid paralysis. He is not a candidate for tNKASE 2/2 downtrending hemoglobin which now stands at 4.8. He is not a candidate for thrombectomy due to poor functional baseline with baseline mRS of 4, he is a poor surgical candidate 2/2 hemoglobin of 4.8.  CTH was negative for a large hypodensity concerning for a large territory infarct or hyperdensity concerning for an ICH.  Will start workup with MRI Brain but this is unlikely to change management as he is unlikely to be able to get antiplatelet or anticoagulation 2/2 low hemoglobin.  I spoke with patient and with his daughter over phone and explained the situation to them.  This patient is critically ill and at significant risk of neurological worsening, death and care requires constant monitoring of vital signs, hemodynamics,respiratory and cardiac monitoring, neurological assessment, discussion with family, other specialists and medical decision making of high complexity. I spent 40 minutes of neurocritical care time  in the care of  this patient. This was time spent independent of any time provided by nurse practitioner or PA.  Donnetta Simpers [facility] Pager Number [phone] 07/18/2021  7:35  PM   Donnetta Simpers, MD [facility] [phone]   If 7pm to 7am, please call on call as listed on [facility].

## 2021-07-19 DIAGNOSIS — E785 Hyperlipidemia, unspecified: Secondary | ICD-10-CM

## 2021-07-19 DIAGNOSIS — I6340 Cerebral infarction due to embolism of unspecified cerebral artery: Secondary | ICD-10-CM | POA: Insufficient documentation

## 2021-07-19 DIAGNOSIS — Z515 Encounter for palliative care: Secondary | ICD-10-CM

## 2021-07-19 DIAGNOSIS — E669 Obesity, unspecified: Secondary | ICD-10-CM

## 2021-07-19 DIAGNOSIS — N184 Chronic kidney disease, stage 4 (severe): Secondary | ICD-10-CM

## 2021-07-19 DIAGNOSIS — E1169 Type 2 diabetes mellitus with other specified complication: Secondary | ICD-10-CM

## 2021-07-19 DIAGNOSIS — I10 Essential (primary) hypertension: Secondary | ICD-10-CM

## 2021-07-19 LAB — CBC WITH DIFFERENTIAL/PLATELET
Abs Immature Granulocytes: 0.1 10*3/uL — ABNORMAL HIGH (ref 0.00–0.07)
Basophils Absolute: 0 10*3/uL (ref 0.0–0.1)
Basophils Relative: 0 %
Eosinophils Absolute: 0.1 10*3/uL (ref 0.0–0.5)
Eosinophils Relative: 2 %
HCT: 14.6 % — ABNORMAL LOW (ref 39.0–52.0)
Hemoglobin: 4.8 g/dL — CL (ref 13.0–17.0)
Lymphocytes Relative: 30 %
Lymphs Abs: 1.5 10*3/uL (ref 0.7–4.0)
MCH: 30.8 pg (ref 26.0–34.0)
MCHC: 32.9 g/dL (ref 30.0–36.0)
MCV: 93.6 fL (ref 80.0–100.0)
Monocytes Absolute: 0.2 10*3/uL (ref 0.1–1.0)
Monocytes Relative: 4 %
Myelocytes: 1 %
Neutro Abs: 3.2 10*3/uL (ref 1.7–7.7)
Neutrophils Relative %: 63 %
Platelets: 217 10*3/uL (ref 150–400)
RBC: 1.56 MIL/uL — ABNORMAL LOW (ref 4.22–5.81)
RDW: 24.9 % — ABNORMAL HIGH (ref 11.5–15.5)
WBC: 5.1 10*3/uL (ref 4.0–10.5)
nRBC: 1 /100 WBC — ABNORMAL HIGH
nRBC: 1.2 % — ABNORMAL HIGH (ref 0.0–0.2)

## 2021-07-19 LAB — COMPREHENSIVE METABOLIC PANEL
ALT: 7 U/L (ref 0–44)
AST: 12 U/L — ABNORMAL LOW (ref 15–41)
Albumin: 2.8 g/dL — ABNORMAL LOW (ref 3.5–5.0)
Alkaline Phosphatase: 50 U/L (ref 38–126)
Anion gap: 12 (ref 5–15)
BUN: 39 mg/dL — ABNORMAL HIGH (ref 8–23)
CO2: 25 mmol/L (ref 22–32)
Calcium: 8.3 mg/dL — ABNORMAL LOW (ref 8.9–10.3)
Chloride: 96 mmol/L — ABNORMAL LOW (ref 98–111)
Creatinine, Ser: 2.75 mg/dL — ABNORMAL HIGH (ref 0.61–1.24)
GFR, Estimated: 22 mL/min — ABNORMAL LOW (ref >60)
Glucose, Bld: 152 mg/dL — ABNORMAL HIGH (ref 70–99)
Potassium: 3.9 mmol/L (ref 3.5–5.1)
Sodium: 133 mmol/L — ABNORMAL LOW (ref 135–145)
Total Bilirubin: 0.6 mg/dL (ref 0.3–1.2)
Total Protein: 5.5 g/dL — ABNORMAL LOW (ref 6.5–8.1)

## 2021-07-19 LAB — LIPID PANEL
Cholesterol: 83 mg/dL (ref 0–200)
HDL: 38 mg/dL — ABNORMAL LOW (ref >40)
LDL Cholesterol: 35 mg/dL (ref 0–99)
Total CHOL/HDL Ratio: 2.2 RATIO
Triglycerides: 52 mg/dL (ref <150)
VLDL: 10 mg/dL (ref 0–40)

## 2021-07-19 LAB — GLUCOSE, CAPILLARY
Glucose-Capillary: 135 mg/dL — ABNORMAL HIGH (ref 70–99)
Glucose-Capillary: 231 mg/dL — ABNORMAL HIGH (ref 70–99)
Glucose-Capillary: 197 mg/dL — ABNORMAL HIGH (ref 70–99)

## 2021-07-19 MED ORDER — BIOTENE DRY MOUTH MT LIQD: 15.0000 mL | OROMUCOSAL | Status: DC | PRN

## 2021-07-19 MED ORDER — HYDROMORPHONE HCL 1 MG/ML IJ SOLN: 0.5000 mg | INTRAMUSCULAR | Status: DC | PRN

## 2021-07-19 MED ORDER — GLYCOPYRROLATE 0.2 MG/ML IJ SOLN: 0.2000 mg | INTRAMUSCULAR | Status: DC | PRN

## 2021-07-19 MED ORDER — MORPHINE SULFATE (PF) 2 MG/ML IV SOLN: 2.0000 mg | INTRAVENOUS | Status: DC | PRN

## 2021-07-19 MED ORDER — GLYCOPYRROLATE 1 MG PO TABS
1.0000 mg | ORAL_TABLET | ORAL | Status: DC | PRN
  Filled 2021-07-19: qty 1

## 2021-07-19 MED ORDER — LORAZEPAM 2 MG/ML IJ SOLN: 0.5000 mg | INTRAMUSCULAR | Status: DC | PRN

## 2021-07-19 MED ORDER — POLYVINYL ALCOHOL 1.4 % OP SOLN
1.0000 [drp] | Freq: Four times a day (QID) | OPHTHALMIC | Status: DC | PRN
  Filled 2021-07-19: qty 15

## 2021-07-19 NOTE — Evaluation (Signed)
Occupational Therapy Evaluation Patient Details Name: [name] MRN: [id number] DOB: 04-04-1931 Today's Date: 07/19/2021   History of Present Illness Patient is a 88 y/o male who presents on 07/16/21 with fall and dislodging of choley drain as well as cough and SOB. Found to have sepsis due to CAP, acute on chronic CHF and severe anemia with Hbg 4.8.On 3/3, pt with new acute onset of R sided flaccidity, difficulty with word finding. MRI brain showed multifocal acute watershed ischemia within L hemisphere, focal acute ischemia in R parietal lobe. PMH includes AAA, OSA, CAD s/p CABG, CVA, CHF, DM, HTN, indwelling foley, Jehovah's witness, cholecystitis with cholecystitis tube placed   Clinical Impression   PTA, pt typically lives alone but reports daughter/son in law recently began staying with pt due to functional decline. Pt reports typically ambulatory short distances with walker but using wheelchair more in the past couple of weeks. Pt typically Modified Independent with ADLs and simple IADLs. Pt presents now with deficits in R nondominant UE function, balance, coordination, proprioception, and cognition. Pt able to sit EOB statically without support today though requires +2 physical assist to complete bed mobility. Pt unable to successfully stand today despite attempts, requires +2 assist for scooting along bedside. Overall, pt requires up to Max A for UB ADLs and Total A (+2 if attempted in standing) for LB ADLs. Based on current deficits and PLOF, pt may benefit from AIR level therapies. Will continue to follow acutely and update recommendations as appropriate.      Recommendations for follow up therapy are one component of a multi-disciplinary discharge planning process, led by the attending physician.  Recommendations may be updated based on patient status, additional functional criteria and insurance authorization.   Follow Up Recommendations  Acute inpatient rehab (3hours/day)     Assistance Recommended at Discharge Frequent or constant Supervision/Assistance  Patient can return home with the following Two people to help with walking and/or transfers;Two people to help with bathing/dressing/bathroom    Functional Status Assessment  Patient has had a recent decline in their functional status and demonstrates the ability to make significant improvements in function in a reasonable and predictable amount of time.  Equipment Recommendations  None recommended by OT (appears to have all needed DME)    Recommendations for Other Services Rehab consult     Precautions / Restrictions Precautions Precautions: Fall;Other (comment) Precaution Comments: chronic subrapubic catheter, monitor hgb Restrictions Weight Bearing Restrictions: No      Mobility Bed Mobility Overal bed mobility: Needs Assistance Bed Mobility: Supine to Sit, Sit to Supine, Rolling Rolling: Mod assist   Supine to sit: Max assist, +2 for safety/equipment, +2 for physical assistance, HOB elevated Sit to supine: Total assist, +2 for physical assistance, +2 for safety/equipment   General bed mobility comments: able to use L UE to pull on bedrail, assist with L LE to EOB. significant assist needed for trunk mgmt to come to sittting and scoot forward on EOB. Total A x 2 to get back to supine. Mod A to roll to R side for bed pad adjustment    Transfers Overall transfer level: Needs assistance Equipment used: 2 person hand held assist               General transfer comment: Attempted sit to stand though unable to successfully complete even with 2 person assist (able to clear bottom some). Total A x 2 to scoot along bedside towards [facility]      Balance Overall balance assessment:  Needs assistance, History of Falls Sitting-balance support: Feet supported, No upper extremity supported Sitting balance-Leahy Scale: Fair Sitting balance - Comments: close min guard/supervision for static balance though  able to maintain well today. Limitations evident with dynamic challenges                                   ADL either performed or assessed with clinical judgement   ADL Overall ADL's : Needs assistance/impaired Eating/Feeding: Set up;Sitting;Bed level   Grooming: Minimal assistance;Sitting   Upper Body Bathing: Moderate assistance;Sitting   Lower Body Bathing: Total assistance;Sit to/from stand;+2 for safety/equipment;+2 for physical assistance   Upper Body Dressing : Sitting;Maximal assistance   Lower Body Dressing: Total assistance;+2 for physical assistance;+2 for safety/equipment;Sit to/from stand       Toileting- Water quality scientist and Hygiene: Total assistance;+2 for physical assistance;+2 for safety/equipment;Sit to/from stand         General ADL Comments: Pt with improved sitting balance in comparison to PT eval yesterday. Noted R UE deficits and signifcant weakness - unable to successfully stand today     Vision Baseline Vision/History: 1 Wears glasses Ability to See in Adequate Light: 1 Impaired Patient Visual Report: No change from baseline Vision Assessment?: No apparent visual deficits     Perception     Praxis      Pertinent Vitals/Pain Pain Assessment Pain Assessment: No/denies pain     Hand Dominance Left   Extremity/Trunk Assessment Upper Extremity Assessment Upper Extremity Assessment: RUE deficits/detail RUE Deficits / Details: questionable shoulder subluxation (to be further assessed). able to move 2nd digit actively but could not complete composite flexion/extension. no active wrist movement but PROM WFL. noted bicep/tricep 3/5 strength but difficulty coordinating movements. Shoulder strength 3/5, unable to lift beyond 90* actively. reports able to feel sensation but observed proprioception deficits RUE Sensation: decreased proprioception RUE Coordination: decreased gross motor;decreased fine motor   Lower Extremity  Assessment Lower Extremity Assessment: Defer to PT evaluation   Cervical / Trunk Assessment Cervical / Trunk Assessment: Kyphotic   Communication Communication Communication: Receptive difficulties;Expressive difficulties   Cognition Arousal/Alertness: Awake/alert Behavior During Therapy: Flat affect, WFL for tasks assessed/performed Overall Cognitive Status: Impaired/Different from baseline Area of Impairment: Problem solving, Awareness, Following commands, Safety/judgement, Memory                     Memory: Decreased short-term memory Following Commands: Follows one step commands with increased time, Follows one step commands consistently Safety/Judgement: Decreased awareness of safety, Decreased awareness of deficits Awareness: Emergent Problem Solving: Slow processing, Decreased initiation, Difficulty sequencing, Requires tactile cues, Requires verbal cues General Comments: pleasant, appropriate in conversation. Noted word finding difficulty, questionable memory deficits. Pt does say "yes" when asked if he knew what he wanted to say but unable to get the words out. Some decreased awareness of deficits (reports unable to move R UE but movement noted during session)     General Comments  BP 140s/70s, endorses dizziness sitting EOB (4.8 hgb, jehovah's witness per chart). Cleared by neuro PA to work with for OT    Exercises Exercises: Other exercises Other Exercises Other Exercises: P/AAROM of R UE; encouraged AROM within pt tolerance   Shoulder Instructions      Home Living Family/patient expects to be discharged to:: Private residence Living Arrangements: Alone;Other (Comment) (typically lives alone but daughter/SIL have been staying to assist; they live around [place] area) Available Help at  Discharge: Family;Available 24 hours/day Type of Home: House Home Access: Ramped entrance     Home Layout: Two level;Able to live on main level with  bedroom/bathroom Alternate Level Stairs-Number of Steps: flight Alternate Level Stairs-Rails: Right Bathroom Shower/Tub: Occupational psychologist: Handicapped height Bathroom Accessibility: Yes   Home Equipment: Wheelchair - power;Wheelchair - Publishing copy (2 wheels);Rollator (4 wheels);BSC/3in1;Shower seat   Additional Comments: two walker accessible bathrooms on main level that have high toilets, and either tub shower or walk in shower. Pt reports typically living alone but daughter recently has been staying with him and assisting due to decline      Prior Functioning/Environment Prior Level of Function : Independent/Modified Independent;Needs assist             Mobility Comments: ambulates with use of rollator typically, but reports recent decline in the past couple of weeks and unable to ambulate as previously done. use of wheelchair has increased recently. Working with HHPT ADLs Comments: Reports typically MOD I for ADLs, simple meal prep but has required increased assist in past couple of weeks        OT Problem List: Decreased strength;Decreased range of motion;Decreased activity tolerance;Impaired balance (sitting and/or standing);Decreased coordination;Decreased cognition;Cardiopulmonary status limiting activity;Impaired UE functional use;Impaired sensation;Impaired tone      OT Treatment/Interventions: Self-care/ADL training;Therapeutic exercise;DME and/or AE instruction;Energy conservation;Neuromuscular education;Manual therapy;Therapeutic activities;Patient/family education;Balance training    OT Goals(Current goals can be found in the care plan section) Acute Rehab OT Goals Patient Stated Goal: get better, open to rehab recs OT Goal Formulation: With patient Time For Goal Achievement: 08/02/21 Potential to Achieve Goals: Good  OT Frequency: Min 2X/week    Co-evaluation              AM-PAC OT "6 Clicks" Daily Activity     Outcome Measure  Help from another person eating meals?: A Little Help from another person taking care of personal grooming?: A Little Help from another person toileting, which includes using toliet, bedpan, or urinal?: Total Help from another person bathing (including washing, rinsing, drying)?: A Lot Help from another person to put on and taking off regular upper body clothing?: A Lot Help from another person to put on and taking off regular lower body clothing?: Total 6 Click Score: 12   End of Session Equipment Utilized During Treatment: Gait belt;Oxygen Nurse Communication: Mobility status;Need for lift equipment  Activity Tolerance: Patient tolerated treatment well Patient left: in bed;with call bell/phone within reach;with bed alarm set  OT Visit Diagnosis: Unsteadiness on feet (R26.81);Other abnormalities of gait and mobility (R26.89);Muscle weakness (generalized) (M62.81);Other symptoms and signs involving cognitive function                Time: 6579-0383 OT Time Calculation (min): 19 min Charges:  OT General Charges $OT Visit: 1 Visit OT Evaluation $OT Eval Moderate Complexity: 1 Mod  Malachy Chamber, OTR/L Acute Rehab Services Office: [phone]   Layla Maw 07/19/2021, 10:32 AM

## 2021-07-19 NOTE — Progress Notes (Signed)
? ?Palliative Medicine Inpatient Follow Up Note ? ?HPI: ?88 y.o. 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  He was also found to be dyspneic and with a cough over the last few weeks.  WBC showed white count up at 17.1 anemia at 6.4.  Determined to be septic from healthcare acquired pneumonia.  Also with acute on chronic heart failure exacerbation.  Admitted for diuresis and IV antibiotics.  Palliative medicine consulted for goals of care due to his ongoing severe anemia hemoglobin today was 4.8.  He he declines blood transfusion products due to his faith tradition of Jehovah's Witness.  Palliative care was consulted to have more dedicated goals of care conversations. ? ?Today's Discussion (07/19/2021): ? ?*Please note that this is a verbal dictation therefore any spelling or grammatical errors are due to the "[place] [employer]" system interpretation. ? ?Chart reviewed inclusive of vital signs, progress notes, laboratory results, and diagnostic images.  ? ?I met with Elijiah this morning.  We reviewed his history of congestive heart failure, coronary artery disease, CVA, and chronic anemia.  Discussed that over the past 5 weeks Nolin's clinical and physical condition has declined.  He shares that it is hard to imagine being less independent in the long-term. ? ?Created space and opportunity for patient to explore thoughts feelings and fears regarding current medical situation. ? ?Latrell tried to get out of bed though he was unable to due to right sided paralysis.  He was able to sit at the side of the bed for 5 minutes though got fatigued very  quickly and had to lay back down.  We reviewed that a lot of his symptoms could be in the setting of his profound anemia.  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 ? ?Questions and concerns addressed  ? ?Palliative Support Provided ?______________________________________________ ?Addendum: ? ?I met this afternoon with Kaito, his daughter [name] and his nephew Sonia Side.  We also reviewed his acute on chronic medical conditions as well. ? ?Discussed Cecil's sepsis in the setting of biliary drain dislodgment. ? ?Dr. Erlinda Hong  was able to update family in regards to patients remote infarcts and the long term that he will likely be dependent on care moving forward. He is higher risk for additional injury in the setting of his severe anemia.  ? ?I met with Shanon Brow at bedside this afternoon.  I shared with him all of the information that had been acquired since this morning.  We reviewed that his clinical condition is quite complicated in the setting of his heart failure, anemia, recent fall, and sepsis.  Discussed that we have 2 choices either to continue pursuing medical care which unfortunately will likely lead to rehospitalization's and similarly poor outcomes or to transition her focus to comfort. ? ?Ervie expresses that he does not wish to die but if he does he would like to be comfortable and not in any pain. We talked about transition to comfort measures in house and what that would entail inclusive of medications to control pain, dyspnea, agitation, nausea, itching, and hiccups.  We  discussed stopping all uneccessary measures such as cardiac monitoring, blood draws, needle sticks, antibiotics, and frequent vital signs.  ? ?I reviewed with Darious that my role is to respect and advocate for what is important to him.  He shares with me very clearly that he would not want to suffer for a prolonged period of time.  He would not want to be unable to care for himself which she has  been for the last 5 weeks.  He expresses interest in comfort based care only at this time ? ?Discussed the differences between IP hospice and home hospice.  ? ?Objective Assessment: ?Vital Signs ?Vitals:  ? 07/19/21 1450 07/19/21 1453  ?BP: 131/74   ?Pulse:    ?Resp: 20   ?Temp:    ?SpO2: 95% 95%  ? ? ?Intake/Output Summary (Last 24 hours) at 07/19/2021 1615 ?Last data filed at 07/19/2021 1203 ?Gross per 24 hour  ?Intake 690.07 ml  ?Output 2875 ml  ?Net -2184.93 ml  ? ?Last Weight  Most recent update: 07/19/2021  4:03 AM  ? ? Weight  ?92.5 kg (203 lb 14.8 oz)  ?      ? ?  ? ?Gen: Frail elderly male in no acute distress ?HEENT: moist mucous membranes ?CV: Regular rate and rhythm ?PULM: On room air breathing is even and nonlabored ?ABD: soft/nontender  ?EXT: No edema  ?Neuro: Alert and oriented x3  ? ?SUMMARY OF RECOMMENDATIONS   ?DNAR/DNI ? ?Comfort focused care ? ?Comfort medications per [facility] ? ?TOC - Patient will likely qualify for inpatient hospice once antibiotics have been stopped for > 24 hours ? ?Ongoing Palliative support ? ?Time Spent: 87 ? ?MDM - High ?______________________________________________________________________________________ ?Tacey Ruiz ?[place] Team ?Team Cell Phone: [phone] ?Please utilize secure chat with additional questions, if there is no response within 30 minutes please call the above phone number ? ?Palliative Medicine Team providers are available by phone from 7am to 7pm daily and can be reached through the team cell phone.  ?Should this patient require assistance outside of these hours, please call the patient's attending physician. ? ? ? ? ?

## 2021-07-19 NOTE — Progress Notes (Addendum)
PROGRESS NOTE    [name]  URK:[id number] DOB: [birth year] DOA: 07/16/2021 PCP: Leonides Sake, MD   Brief Narrative:  [name] is a 88 y.o. Caucasian male with medical history significant for AAA, anemia, combined systolic and diastolic CHF, coronary artery disease status post CABG, essential hypertension, dyslipidemia with type 2 diabetes mellitus, who presented to the emergency room with a consult of Fall and subsequent biliary drain dislodgment.  The patient had a cholecystitis in October at which time he was not thought to be a good surgical candidate given his multiple medical problems and therefore a biliary drain by IR was placed and he was given IV antibiotics and has been stable since then.  About a couple weeks ago he fell from his chair but it was forceful enough to dislodge his biliary drain at that time.  He admitted to worsening cough over the last couple weeks occasionally productive of brownish sputum, with mild dyspnea or wheezing.  No nausea or vomiting or abdominal pain.  No chest pain or palpitations.  His suprapubic catheter has been draining normally.   ED Course: Upon presentation to the emergency room, vital signs were within normal.  BMP showed a BUN of 31 and creatinine 2.46 close to baseline.  BNP in the PE was 988.9 and high-sensitivity troponin was 22.  Lactic acid was 1.5 and CBC showed anemia with hemoglobin of 6.4 and hematocrit of 18.8 comparable to a month ago. EKG as reviewed by me : Pending Imaging: Portable chest ray showed small right pleural effusion and bibasilar atelectasis.  The patient was given IV Rocephin and Zithromax was 6 and 50 mg of p.o. Tylenol.  He will be admitted to a cardiac telemetry bed for further evaluation and management  Assessment & Plan:   Active Problems:   Diabetes mellitus type 2 in obese (HCC)   Hyperlipidemia associated with type 2 diabetes mellitus ([place])   Coronary artery disease involving native coronary artery of  native heart with angina pectoris [facility])   Essential hypertension   Coronary artery disease involving coronary bypass graft of native heart with angina pectoris ([place])   Anemia due to chronic kidney disease   Acute on chronic systolic CHF (congestive heart failure) ([place])   Community acquired pneumonia   Biliary drain displacement   Sepsis due to pneumonia ([place])   Cerebral embolism with cerebral infarction  Acute hypoxic respiratory failure secondary to Acute on chronic systolic CHF, POA: Echo done in December 2022 shows 45 to 50% ejection fraction with global hypokinesis.  Currently requiring 2 L of oxygen and saturating 99%, much improved.  Hopefully we can get him off of oxygen soon..  Chest x-ray shows small right pleural effusion.  BNP elevated more than baseline.  Has had good diuresis.  Continue I will diuretics for today and likely switch to oral diuretics tomorrow, continue strict I's and O's and daily weight.    Sepsis secondary to Healthcare associated pneumonia: Patient qualifies for healthcare associated pneumonia diagnosis since he was admitted in the hospital for more than 3 days within the last 90 days.  He meets sepsis criteria based on fever, tachycardia and tachypnea.  Cultures are negative so far.  Continue cefepime.   Biliary drain displacement: Consulted IR.  They assessed patient.  Per them, they were going to remove the drain in the middle of March and that currently patient does not have any symptoms regarding acute cholecystitis so they will leave that out for now.  Monitor  LFTs.  COVID-19 infection: Patient has tested positive this admission but it appears that he was also positive about 2 months ago so this is likely continuation of the previous positive test.  No indication of treatment for COVID-19 at this point in time.  Discussed with infection control, since this is continuation of his previous COVID-19 positive test, isolation was discontinued on 07/18/2021 per their  recommendations.  Diabetes mellitus type 2 in obese [facility])- (present on admission): Hemoglobin A1c 6.9.  He is only on metformin at home which is on hold.  Continue SSI   Anemia due to chronic kidney disease: Patient's baseline hemoglobin is 6.  Patient is Jehovah's Witness.  Hemoglobin dropped to 5.2 yesterday and further down to 4.8 today.  Discussed in length with the patient and his daughter and they are declining blood transfusion.  They were made aware by me and have been made aware by many physicians in the past that this sort of severe anemia can be very dangerous and can cause death.  They verbalized understanding and still declined blood transfusion.  Essential hypertension: Controlled.  Continue home medications.   Coronary artery disease involving native coronary artery of native heart with angina pectoris ([place])- (present on admission): Asymptomatic - We will continue Plavix, Imdur, as needed sublingual nitroglycerin, Coreg and statin therapy.   Hyperlipidemia: Continue statin.  Acute ischemic stroke: Left-sided watershed infarct of MCA territory likely secondary to occlusion or stenosis source in the setting of severe anemia.  MRI confirmed this.  CT head was initially negative.  Carotid Doppler negative for severe stenosis.  2D echo shows EF of 45 to 50%.  Global left ventricular hypokinesis.  Neurology on board.  Patient currently on Plavix.  PT OT recommends CIR.  CIR assessed as well.  CKD stage IV: At baseline.  Goal of care: Palliative care consulted.  Patient has very poor prognosis due to severe anemia.  Is a stroke was likely secondary to severe anemia as well.  Palliative care has discussed with the family.  DVT prophylaxis:    Code Status: DNR  Family Communication:  None present at bedside.  Left a voicemail to the daughter.  Status is: Inpatient Remains inpatient appropriate because: He needs inpatient management.   Estimated body mass index is 26.9 kg/m as  calculated from the following:   Height as of this encounter: '6\' 1"'$  (1.854 m).   Weight as of this encounter: 92.5 kg.  Pressure Injury 05/05/21 Buttocks Right Stage 1 -  Intact skin with non-blanchable redness of a localized area usually over a bony prominence. small open area on right buttocks (Active)  05/05/21 1700  Location: Buttocks  Location Orientation: Right  Staging: Stage 1 -  Intact skin with non-blanchable redness of a localized area usually over a bony prominence.  Wound Description (Comments): small open area on right buttocks  Present on Admission:    Nutritional Assessment: Body mass index is 26.9 kg/m.Marland Kitchen Seen by dietician.  I agree with the assessment and plan as outlined below: Nutrition Status:        . Skin Assessment: I have examined the patient's skin and I agree with the wound assessment as performed by the wound care RN as outlined below: Pressure Injury 05/05/21 Buttocks Right Stage 1 -  Intact skin with non-blanchable redness of a localized area usually over a bony prominence. small open area on right buttocks (Active)  05/05/21 1700  Location: Buttocks  Location Orientation: Right  Staging: Stage 1 -  Intact  skin with non-blanchable redness of a localized area usually over a bony prominence.  Wound Description (Comments): small open area on right buttocks  Present on Admission:     Consultants:  None  Procedures:  None  Antimicrobials:  Anti-infectives (From admission, onward)    Start     Dose/Rate Route Frequency Ordered Stop   07/18/21 0400  azithromycin (ZITHROMAX) 500 mg in sodium chloride 0.9 % 250 mL IVPB        500 mg 250 mL/hr over 60 Minutes Intravenous Every 24 hours 07/17/21 0512 07/22/21 0359   07/18/21 0000  cefTRIAXone (ROCEPHIN) 2 g in sodium chloride 0.9 % 100 mL IVPB  Status:  Discontinued        2 g 200 mL/hr over 30 Minutes Intravenous Every 24 hours 07/17/21 0512 07/17/21 1607   07/17/21 1800  ceFEPIme (MAXIPIME) 2 g in  sodium chloride 0.9 % 100 mL IVPB        2 g 200 mL/hr over 30 Minutes Intravenous Every 24 hours 07/17/21 1656     07/17/21 0500  cefTRIAXone (ROCEPHIN) 2 g in sodium chloride 0.9 % 100 mL IVPB  Status:  Discontinued        2 g 200 mL/hr over 30 Minutes Intravenous Every 24 hours 07/17/21 0452 07/17/21 0512   07/17/21 0500  azithromycin (ZITHROMAX) 500 mg in sodium chloride 0.9 % 250 mL IVPB  Status:  Discontinued        500 mg 250 mL/hr over 60 Minutes Intravenous Every 24 hours 07/17/21 0452 07/17/21 0512   07/17/21 0130  cefTRIAXone (ROCEPHIN) 2 g in sodium chloride 0.9 % 100 mL IVPB        2 g 200 mL/hr over 30 Minutes Intravenous  Once 07/17/21 0128 07/17/21 0318   07/17/21 0130  azithromycin (ZITHROMAX) 500 mg in sodium chloride 0.9 % 250 mL IVPB  Status:  Discontinued        500 mg 250 mL/hr over 60 Minutes Intravenous Every 24 hours 07/17/21 0128 07/17/21 0527         Subjective:  Seen and examined.  Fully alert and oriented.  Now he is able to lift his right lower extremity which she was not able to do yesterday.  Still with weakness in the right upper extremity.  No dysarthria or dysphagia.  Objective: Vitals:   07/19/21 0838 07/19/21 0850 07/19/21 1138 07/19/21 1200  BP: 121/84 127/70 117/62   Pulse: 95 90 73   Resp: '20 18 18   '$ Temp:  97.9 F (36.6 C) 98 F (36.7 C)   TempSrc:  Oral Oral   SpO2: 95% 96% 98% 99%  Weight:      Height:        Intake/Output Summary (Last 24 hours) at 07/19/2021 1311 Last data filed at 07/19/2021 1203 Gross per 24 hour  Intake 690.07 ml  Output 3275 ml  Net -2584.93 ml    Filed Weights   07/17/21 0500 07/18/21 0037 07/19/21 0400  Weight: 94.8 kg 96.8 kg 92.5 kg    Examination:  General exam: Appears calm and comfortable  Respiratory system: Clear to auscultation. Respiratory effort normal. Cardiovascular system: S1 & S2 heard, RRR. No JVD, murmurs, rubs, gallops or clicks. No pedal edema. Gastrointestinal system: Abdomen is  nondistended, soft and nontender. No organomegaly or masses felt. Normal bowel sounds heard. Central nervous system: Alert and oriented.  Positive pronator drift.  0/5 power in right upper extremity.  4/5 power in right lower extremity.  Normal  power in left upper and lower extremity. Extremities: Symmetric 5 x 5 power. Skin: No rashes, lesions or ulcers.  Psychiatry: Judgement and insight appear normal. Mood & affect appropriate.    Data Reviewed: I have personally reviewed following labs and imaging studies  CBC: Recent Labs  Lab 07/17/21 0057 07/17/21 0700 07/18/21 0423 07/19/21 0332  WBC 13.3* 17.1* 7.8 5.1  NEUTROABS 9.7*  --  5.1 3.2  HGB 6.4* 5.2* 4.8* 4.8*  HCT 18.8* 15.9* 14.8* 14.6*  MCV 94.5 95.2 94.3 93.6  PLT 372 283 236 970    Basic Metabolic Panel: Recent Labs  Lab 07/17/21 0057 07/17/21 0700 07/18/21 0423 07/19/21 0332  NA 135 136 132* 133*  K 3.8 3.8 3.8 3.9  CL 99 99 98 96*  CO2 '24 26 25 25  '$ GLUCOSE 193* 144* 110* 152*  BUN 31* 31* 35* 39*  CREATININE 2.46* 2.47* 2.36* 2.75*  CALCIUM 8.7* 8.3* 8.1* 8.3*  MG  --   --  1.9  --     GFR: Estimated Creatinine Clearance: 21 mL/min (A) (by C-G formula based on SCr of 2.75 mg/dL (H)). Liver Function Tests: Recent Labs  Lab 07/17/21 0057 07/17/21 0700 07/18/21 0423 07/19/21 0332  AST 16 18 13* 12*  ALT '10 10 9 7  '$ ALKPHOS 61 52 50 50  BILITOT 2.6* 1.1 0.8 0.6  PROT 6.6 5.6* 5.5* 5.5*  ALBUMIN 3.7 3.1* 2.8* 2.8*    No results for input(s): LIPASE, AMYLASE in the last 168 hours. No results for input(s): AMMONIA in the last 168 hours. Coagulation Profile: No results for input(s): INR, PROTIME in the last 168 hours. Cardiac Enzymes: No results for input(s): CKTOTAL, CKMB, CKMBINDEX, TROPONINI in the last 168 hours. BNP (last 3 results) No results for input(s): PROBNP in the last 8760 hours. HbA1C: Recent Labs    07/17/21 0701  HGBA1C 6.9*    CBG: Recent Labs  Lab 07/18/21 1115  07/18/21 1719 07/18/21 2122 07/19/21 0557 07/19/21 1135  GLUCAP 168* 179* 168* 135* 231*    Lipid Profile: Recent Labs    07/19/21 0332  CHOL 83  HDL 38*  LDLCALC 35  TRIG 52  CHOLHDL 2.2   Thyroid Function Tests: No results for input(s): TSH, T4TOTAL, FREET4, T3FREE, THYROIDAB in the last 72 hours. Anemia Panel: No results for input(s): VITAMINB12, FOLATE, FERRITIN, TIBC, IRON, RETICCTPCT in the last 72 hours. Sepsis Labs: Recent Labs  Lab 07/17/21 0057 07/17/21 0257 07/17/21 1646  PROCALCITON  --   --  3.53  LATICACIDVEN 1.5 1.2  --      Recent Results (from the past 240 hour(s))  Blood culture (routine x 2)     Status: None (Preliminary result)   Collection Time: 07/17/21  1:40 AM   Specimen: BLOOD  Result Value Ref Range Status   Specimen Description BLOOD LEFT ANTECUBITAL  Final   Special Requests   Final    BOTTLES DRAWN AEROBIC AND ANAEROBIC Blood Culture adequate volume   Culture   Final    NO GROWTH 2 DAYS Performed at [facility], [address] [place], [place] [address]    Report Status PENDING  Incomplete  Blood culture (routine x 2)     Status: None (Preliminary result)   Collection Time: 07/17/21  1:46 AM   Specimen: BLOOD  Result Value Ref Range Status   Specimen Description BLOOD BLOOD LEFT WRIST  Final   Special Requests   Final    BOTTLES DRAWN AEROBIC AND  ANAEROBIC Blood Culture adequate volume   Culture   Final    NO GROWTH 2 DAYS Performed at [facility], [place] [address], [place], [place] [address]    Report Status PENDING  Incomplete  Resp Panel by RT-PCR (Flu A&B, Covid) Nasopharyngeal Swab     Status: Abnormal   Collection Time: 07/17/21 11:54 AM   Specimen: Nasopharyngeal Swab; Nasopharyngeal(NP) swabs in vial transport medium  Result Value Ref Range Status   SARS Coronavirus 2 by RT PCR POSITIVE (A) NEGATIVE Final    Comment: (NOTE) SARS-CoV-2 target nucleic acids are DETECTED.  The SARS-CoV-2 RNA is generally  detectable in upper respiratory specimens during the acute phase of infection. Positive results are indicative of the presence of the identified virus, but do not rule out bacterial infection or co-infection with other pathogens not detected by the test. Clinical correlation with patient history and other diagnostic information is necessary to determine patient infection status. The expected result is Negative.  Fact Sheet for Patients: EntrepreneurPulse.com.au  Fact Sheet for Healthcare Providers: IncredibleEmployment.be  This test is not yet approved or cleared by the [place] FDA and  has been authorized for detection and/or diagnosis of SARS-CoV-2 by FDA under an Emergency Use Authorization (EUA).  This EUA will remain in effect (meaning this test can be used) for the duration of  the COVID-19 declaration under Section 564(b)(1) of the A ct, 21 U.S.C. section 360bbb-3(b)(1), unless the authorization is terminated or revoked sooner.     Influenza A by PCR NEGATIVE NEGATIVE Final   Influenza B by PCR NEGATIVE NEGATIVE Final    Comment: (NOTE) The Xpert Xpress SARS-CoV-2/FLU/RSV plus assay is intended as an aid in the diagnosis of influenza from Nasopharyngeal swab specimens and should not be used as a sole basis for treatment. Nasal washings and aspirates are unacceptable for Xpert Xpress SARS-CoV-2/FLU/RSV testing.  Fact Sheet for Patients: EntrepreneurPulse.com.au  Fact Sheet for Healthcare Providers: IncredibleEmployment.be  This test is not yet approved or cleared by the [place] FDA and has been authorized for detection and/or diagnosis of SARS-CoV-2 by FDA under an Emergency Use Authorization (EUA). This EUA will remain in effect (meaning this test can be used) for the duration of the COVID-19 declaration under Section 564(b)(1) of the Act, 21 U.S.C. section 360bbb-3(b)(1), unless the  authorization is terminated or revoked.  Performed at [facility], [place] [address], [place], [place] [address]       Radiology Studies: MR BRAIN WO CONTRAST  Result Date: 07/18/2021 CLINICAL DATA:  Acute neurologic deficit EXAM: MRI HEAD WITHOUT CONTRAST TECHNIQUE: Multiplanar, multiecho pulse sequences of the brain and surrounding structures were obtained without intravenous contrast. COMPARISON:  None. FINDINGS: Brain: Multifocal acute ischemia within the left hemisphere watershed distribution. There is a single focus of acute ischemia in the posterior right parietal lobe. No acute or chronic hemorrhage. Hyperintense T2-weighted signal is moderately widespread throughout the white matter. Generalized volume loss without a clear lobar predilection. Old left frontal small vessel infarct. The midline structures are normal. Vascular: Major flow voids are preserved. Skull and upper cervical spine: Normal calvarium and skull base. Visualized upper cervical spine and soft tissues are normal. Sinuses/Orbits:No paranasal sinus fluid levels or advanced mucosal thickening. No mastoid or middle ear effusion. Normal orbits. IMPRESSION: 1. Multifocal acute ischemia within the left hemisphere watershed distribution. Suspect single focus of acute ischemia in the posterior right parietal lobe. No hemorrhage or mass effect. 2. Moderate chronic small vessel ischemic disease  and volume loss. Electronically Signed   By: Ulyses Jarred M.D.   On: 07/18/2021 23:04   CT HEAD CODE STROKE WO CONTRAST  Result Date: 07/18/2021 CLINICAL DATA:  Code stroke. EXAM: CT HEAD WITHOUT CONTRAST TECHNIQUE: Contiguous axial images were obtained from the base of the skull through the vertex without intravenous contrast. RADIATION DOSE REDUCTION: This exam was performed according to the departmental dose-optimization program which includes automated exposure control, adjustment of the mA and/or kV according to patient size and/or use  of iterative reconstruction technique. COMPARISON:  Brain MRI 05/05/2021, CT head 05/05/2021 FINDINGS: Brain: There is no evidence of acute intracranial hemorrhage, extra-axial fluid collection, or acute infarct. Background parenchymal volume loss with prominence of the ventricular system and extra-axial CSF spaces is unchanged. The ventricles are stable in size. Remote infarcts in the left frontoparietal region and left frontal lobe subcortical white matter are unchanged. There is no mass lesion.  There is no midline shift. Vascular: There is calcification of the bilateral cavernous ICAs and vertebral arteries. Skull: Normal. Negative for fracture or focal lesion. Sinuses/Orbits: There is mild mucosal thickening in the maxillary sinuses. Bilateral lens implants are in place. The globes and orbits are otherwise unremarkable. Other: None. ASPECTS [facility] Stroke Program Early CT Score) - Ganglionic level infarction (caudate, lentiform nuclei, internal capsule, insula, M1-M3 cortex): 7 - Supraganglionic infarction (M4-M6 cortex): 3 Total score (0-10 with 10 being normal): 10 IMPRESSION: 1. No acute intracranial hemorrhage or infarct. 2. ASPECTS is 10 3. Stable remote infarcts on a background of parenchymal volume loss and chronic white matter microangiopathy as above. These results were paged via AMION at the time of interpretation on 07/18/2021 at 4:22 pm to provider [facility] . Electronically Signed   By: Valetta Mole M.D.   On: 07/18/2021 16:48    Scheduled Meds:  amLODipine  10 mg Oral Q1500   ascorbic acid  500 mg Oral Q1400   carvedilol  6.25 mg Oral BID WC   clopidogrel  75 mg Oral Q1400   doxazosin  4 mg Oral Q1400   ferrous sulfate  325 mg Oral H3716   folic acid  1 mg Oral Daily   furosemide  40 mg Intravenous BID   guaiFENesin  600 mg Oral BID   hydrALAZINE  100 mg Oral BID   insulin aspart  0-9 Units Subcutaneous TID AC & HS   isosorbide mononitrate  60 mg Oral BID   oxybutynin  10 mg  Oral Daily   pantoprazole  40 mg Oral Q1400   saccharomyces boulardii  250 mg Oral BID   sodium bicarbonate  650 mg Oral BID   sodium chloride flush  3 mL Intravenous Q12H   sodium chloride flush  5 mL Intracatheter Q12H   cyanocobalamin  1,000 mcg Oral Q1400   zinc sulfate  220 mg Oral Q1400   Continuous Infusions:  sodium chloride     azithromycin 500 mg (07/19/21 0355)   ceFEPime (MAXIPIME) IV 200 mL/hr at 07/18/21 1725     LOS: 2 days   Time spent: 28 minutes  Darliss Cheney, MD [facility]  07/19/2021, 1:11 PM  Please page via [facility] and do not message via secure chat for urgent patient care matters. Secure chat can be used for non urgent patient care matters.  How to contact the [facility] Attending or Consulting provider [place] or covering provider during after hours [place], for this patient?  Check the care team in [facility] and look  for a) attending/consulting [place] provider listed and b) the [facility] team listed. Page or secure chat [phone]. Log into [email or web address] and use [place] universal password to access. If you do not have the password, please contact the hospital operator. Locate the [facility] provider you are looking for under [facility] and page to a number that you can be directly reached. If you still have difficulty reaching the provider, please page the [facility] (Director on Call) for the Hospitalists listed on amion for assistance.

## 2021-07-19 NOTE — Progress Notes (Signed)
Inpatient Rehab Admissions Coordinator:  ? ?Per therapy recommendations, patient was screened for CIR candidacy by Clemens Catholic, MS, CCC-SLP. At this time, Pt. Is not yet participating well enough that I think he will be able to tolerate the intensity of CIR and MD note indicates  Pt. Will likely discharge to hospice However,  if Pt. Does not elect to d/c with hospice, Pt. may have potential to progress to becoming a potential CIR candidate, so CIR admissions team will follow and monitor for progress and participation with therapies and place consult order if Pt. appears to be an appropriate candidate. Please contact me with any questions.  ? ? ?Clemens Catholic, MS, CCC-SLP ?Rehab Admissions Coordinator  ?[phone] (celll) ?[phone] (office) ? ?

## 2021-07-19 NOTE — Social Work (Signed)
?  CSW was alerted by NP Palliative Sharyn Lull that daughter wants to pursue IP hospice. CSW spoke with daughter Jamey Ripa and she confirmed that she would want to pursue IP due to her not being able to care for him at home. CSW asked for facility choice and [name] stated that either [facility] or [facility] be sufficient however we could try [facility] first. ? ?CSW messaged MD and NP Palliative to gauge if pt met [facility] inpt criteria. It was decided to wait until tomorrow for a final decision about pt. ? ?CSW reached out to [facility] however had to leave a message. CSW was going to check their inpt criteria. ? ?TOC team will continue to assist with discharge planning needs.  ?

## 2021-07-19 NOTE — Progress Notes (Addendum)
STROKE TEAM PROGRESS NOTE   ATTENDING NOTE: I reviewed above note and agree with the assessment and plan. Pt was seen and examined.   88 year old left-handed male with history of CAD/STEMI in 03/2016 status post CABG, hypertension, diabetes, hyperlipidemia, OSA, CHF, strokes admitted for falling at home, sepsis, severe anemia and right-sided weakness progressed to hemiplegia.  History of stroke in 2017 with left MCA infarct. 01/2015 left MCA/PCA and MCA/ACA infarcts.  MRA and CTA head and neck showed left ICA petrous portion 90% stenosis status post angioplasty with residual 50% stenosis.  Put on DAPT and Crestor 5.  Follow-up cerebral angiogram x3 showed left ICA petrous portion 50% stenosis, left M1/M2 high-grade stenosis. 08/2016 left MCA TIA 05/2018 left MCA infarct.  CTA head and neck showed distal left M1 stenosis.  Carotid Doppler negative.  EF 50 to 55%.  LDL 81 and A1c 8.2.  Put on DAPT and outpatient PCSK9 inhibitor 04/2021 left frontal white matter punctate infarct.  MRI showed multifocal intracranial stenosis.  Quite Doppler negative.  EF 45 to 50%.  LDL 31, A1c 8.5.  Continue on DAPT.  On this admission CT no acute abnormality.  MRI showed right cerebellum and left MCA scattered infarct.  EF 45 to 50%.  A1c 6.9, LDL 35.  UA negative.  Blood culture pending.  Creatinine 2.47-2.36.  Hemoglobin shows severe stenosis 6.4->4.8->4.8.  WBC 17.1->7.8.  On exam today, patient awake, alert, eyes open, orientated to age, place and people, but not to time. No aphasia, paucity of speech, following all simple commands. Able to name and repeat simple sentences. No gaze palsy, tracking bilaterally, visual field full. No significant facial droop. Tongue midline. LUE 4/5 and LLE 3/5. RUE proximal 0/5, bicep 2-/5, tricep 1/5, finger grip 2/5. RLE 0/5. Sensation decreased on the right UE and diminished on the right LE, left FTN intact, gait not tested.   Etiology for patient stroke likely due to intracranial  stenosis in the setting of severe anemia and low BP.  Patient is Jehovah's witness, not able to do blood transfusion.  Currently hemoglobin stable at 4.8, still very anemic, not a candidate for antiplatelet treatment.  BP still soft but improving.  Palliative care on board, discussed with daughter and patient, they would like to consider comfort care measures.  For detailed assessment and plan, please refer to above as I have made changes wherever appropriate.   I had long discussion with daughter at bedside, updated pt current condition, treatment plan and potential prognosis, and answered all the questions.  She expressed understanding and appreciation.   Neurology will sign off. Please call with questions. Thanks for the consult.   Rosalin Hawking, MD PhD Stroke Neurology 07/19/2021 3:04 PM     INTERVAL HISTORY Patient is seen in his room with his daughter at the bedside.  He was admitted after a fall at home which dislodged his biliary drain.  He has had worsening productive cough at home and is being treated for HAP.  He has severe anemia with most recent HGB being 4.8 and declines blood transfusions for religious reasons.  His baseline right sided hemiparesis worsened, and a code stroke was called.   MRI brain revealed multifocal acute ischemia in left hemisphere watershed distribution and acute ischemia in right posterior parietal lobe.  Patient was not a candidate for TNK or IR intervention due to low HGB and poor baseline functional status.  Conversation with palliative care is ongoing, and patient will likely either be discharged home with hospice  support or to an inpatient hospice.  Vitals:   07/19/21 0036 07/19/21 0400 07/19/21 0838 07/19/21 0850  BP: 128/72 132/70 121/84 127/70  Pulse: 86 81 95 90  Resp: '18 19 20 18  '$ Temp: 98.5 F (36.9 C) 97.7 F (36.5 C)  97.9 F (36.6 C)  TempSrc: Oral Oral  Oral  SpO2: 90% 92% 95% 96%  Weight:  92.5 kg    Height:       CBC:  Recent Labs   Lab 07/18/21 0423 07/19/21 0332  WBC 7.8 5.1  NEUTROABS 5.1 3.2  HGB 4.8* 4.8*  HCT 14.8* 14.6*  MCV 94.3 93.6  PLT 236 625   Basic Metabolic Panel:  Recent Labs  Lab 07/18/21 0423 07/19/21 0332  NA 132* 133*  K 3.8 3.9  CL 98 96*  CO2 25 25  GLUCOSE 110* 152*  BUN 35* 39*  CREATININE 2.36* 2.75*  CALCIUM 8.1* 8.3*  MG 1.9  --    Lipid Panel:  Recent Labs  Lab 07/19/21 0332  CHOL 83  TRIG 52  HDL 38*  CHOLHDL 2.2  VLDL 10  LDLCALC 35   HgbA1c:  Recent Labs  Lab 07/17/21 0701  HGBA1C 6.9*   Urine Drug Screen: No results for input(s): LABOPIA, COCAINSCRNUR, LABBENZ, AMPHETMU, THCU, LABBARB in the last 168 hours.  Alcohol Level No results for input(s): ETH in the last 168 hours.  IMAGING past 24 hours MR BRAIN WO CONTRAST  Result Date: 07/18/2021 CLINICAL DATA:  Acute neurologic deficit EXAM: MRI HEAD WITHOUT CONTRAST TECHNIQUE: Multiplanar, multiecho pulse sequences of the brain and surrounding structures were obtained without intravenous contrast. COMPARISON:  None. FINDINGS: Brain: Multifocal acute ischemia within the left hemisphere watershed distribution. There is a single focus of acute ischemia in the posterior right parietal lobe. No acute or chronic hemorrhage. Hyperintense T2-weighted signal is moderately widespread throughout the white matter. Generalized volume loss without a clear lobar predilection. Old left frontal small vessel infarct. The midline structures are normal. Vascular: Major flow voids are preserved. Skull and upper cervical spine: Normal calvarium and skull base. Visualized upper cervical spine and soft tissues are normal. Sinuses/Orbits:No paranasal sinus fluid levels or advanced mucosal thickening. No mastoid or middle ear effusion. Normal orbits. IMPRESSION: 1. Multifocal acute ischemia within the left hemisphere watershed distribution. Suspect single focus of acute ischemia in the posterior right parietal lobe. No hemorrhage or mass  effect. 2. Moderate chronic small vessel ischemic disease and volume loss. Electronically Signed   By: Ulyses Jarred M.D.   On: 07/18/2021 23:04   CT HEAD CODE STROKE WO CONTRAST  Result Date: 07/18/2021 CLINICAL DATA:  Code stroke. EXAM: CT HEAD WITHOUT CONTRAST TECHNIQUE: Contiguous axial images were obtained from the base of the skull through the vertex without intravenous contrast. RADIATION DOSE REDUCTION: This exam was performed according to the departmental dose-optimization program which includes automated exposure control, adjustment of the mA and/or kV according to patient size and/or use of iterative reconstruction technique. COMPARISON:  Brain MRI 05/05/2021, CT head 05/05/2021 FINDINGS: Brain: There is no evidence of acute intracranial hemorrhage, extra-axial fluid collection, or acute infarct. Background parenchymal volume loss with prominence of the ventricular system and extra-axial CSF spaces is unchanged. The ventricles are stable in size. Remote infarcts in the left frontoparietal region and left frontal lobe subcortical white matter are unchanged. There is no mass lesion.  There is no midline shift. Vascular: There is calcification of the bilateral cavernous ICAs and vertebral arteries. Skull: Normal. Negative for  fracture or focal lesion. Sinuses/Orbits: There is mild mucosal thickening in the maxillary sinuses. Bilateral lens implants are in place. The globes and orbits are otherwise unremarkable. Other: None. ASPECTS [facility] Stroke Program Early CT Score) - Ganglionic level infarction (caudate, lentiform nuclei, internal capsule, insula, M1-M3 cortex): 7 - Supraganglionic infarction (M4-M6 cortex): 3 Total score (0-10 with 10 being normal): 10 IMPRESSION: 1. No acute intracranial hemorrhage or infarct. 2. ASPECTS is 10 3. Stable remote infarcts on a background of parenchymal volume loss and chronic white matter microangiopathy as above. These results were paged via AMION at the time of  interpretation on 07/18/2021 at 4:22 pm to provider [facility] . Electronically Signed   By: Valetta Mole M.D.   On: 07/18/2021 16:48    PHYSICAL EXAM General:  Drowsy, well-developed patient in no acute distress Respiratory:  Respirations even and unlabored on supplemental O2 via    NEURO:  Mental Status: AA&Ox3  Speech/Language: speech is without dysarthria or aphasia.    Cranial Nerves:  II: PERRL.  III, IV, VI: EOMI. Eyelids elevate symmetrically.  VII: Face is symmetrical.  IX, X:  Phonation is normal.  Motor: 5/5 strength to LUE and LLE and 2/5 strength to RUE and RLE  Tone: is normal and bulk is normal Sensation- Intact to light touch bilaterally.   Gait- deferred  ASSESSMENT/PLAN [name] is a 88 y.o. male with history of DM, CAD, CHF, HLD, chronic anemia and cholecystitis treated with a biliary drain presenting after a fall at home which dislodged his biliary drain.  He has had worsening productive cough at home and is being treated for HAP.  He has severe anemia with most recent HGB being 4.8 and declines blood transfusions for religious reasons.  His baseline right sided hemiparesis worsened, and a code stroke was called.   MRI brain revealed multifocal acute ischemia in left hemisphere watershed distribution and acute ischemia in right posterior parietal lobe.  Patient was not a candidate for TNK or IR intervention due to low HGB and poor baseline functional status.  Conversation with palliative care is ongoing, and patient will likely either be discharged home with hospice support or to an inpatient hospice.  Stroke:  left watershed infarct MCA territory likely secondary due to occlusion or stenosis source in the setting of severe anemia Code Stroke CT head No acute abnormality. Small vessel disease. Atrophy. ASPECTS 10.    MRI  multifocal acute ischemia in left watershed territory, single focus of ischemia in posterior right parietal lobe, moderate chronic  small vessel ischemic disease Carotid Doppler  1-39% stenosis in bilateral carotids 2D Echo EF 45-50%, global left ventricular hypokinesis, hypertrophy of basal-septal segment, interatrial septum not well visualized LDL 35 HgbA1c 6.9 VTE prophylaxis - SCDs    Diet   Diet Heart Room service appropriate? Yes; Fluid consistency: Thin; Fluid restriction: 1500 mL Fluid   clopidogrel 75 mg daily prior to admission, now on clopidogrel 75 mg daily  Therapy recommendations:  CIR Disposition:  pending, likely hospice  Hypertension Home meds:  hydralazine 100 mg BID, doxazosin 4 mg daily, amlodipine 10 mg daily Stable Permissive hypertension (OK if < 220/120) but gradually normalize in 5-7 days Long-term BP goal normotensive  Hyperlipidemia Home meds:  none LDL 35, goal < 70 High intensity statin not indicated as LDL below goal  Diabetes type II Controlled Home meds:  metformin 500 mg BID HgbA1c 6.9, goal < 7.0 CBGs Recent Labs    07/18/21 1719 07/18/21 2122  07/19/21 0557  GLUCAP 179* 168* 135*    SSI  AHRF secondary to acute on chronic CHF Small right pleural effusion seen on chest x-ray EF 45-50% Requiring 2L o2 via [place] Management per primary team  Sepsis secondary to pneumonia Continue cefepime Blood cultures NGTD Management per primary team  Other Stroke Risk Factors Advanced Age >/= 58  Former Cigarette smoker Hx stroke Coronary artery disease Congestive heart failure  Other Active Problems Biliary drain dislodgment IR does not plan to replace drain as patient is no longer symptomatic  Hospital day # [place] , MSN, AGACNP-BC [facility] See Amion for schedule and pager information 07/19/2021 11:56 AM    To contact Stroke Continuity provider, please refer to [email or web address] After hours, contact General Neurology

## 2021-07-20 MED ORDER — HYDROMORPHONE HCL 1 MG/ML IJ SOLN
1.0000 mg | Freq: Two times a day (BID) | INTRAMUSCULAR | Status: DC
  Administered 2021-07-20 (×2): 1 mg via INTRAVENOUS
  Filled 2021-07-20 (×3): qty 1

## 2021-07-20 NOTE — Progress Notes (Addendum)
? ?Palliative Medicine Inpatient Follow Up Note ? ?HPI: ?88 y.o. 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  He was also found to be dyspneic and with a cough over the last few weeks.  WBC showed white count up at 17.1 anemia at 6.4.  Determined to be septic from healthcare acquired pneumonia.  Also with acute on chronic heart failure exacerbation.  Admitted for diuresis and IV antibiotics.  Palliative medicine consulted for goals of care due to his ongoing severe anemia hemoglobin today was 4.8.  He he declines blood transfusion products due to his faith tradition of Jehovah's Witness.  Palliative care was consulted to have more dedicated goals of care conversations. ? ?Today's Discussion (07/20/2021): ? ?*Please note that this is a verbal dictation therefore any spelling or grammatical errors are due to the "[place] [employer]" system interpretation. ? ?Chart reviewed inclusive of vital signs, progress notes, laboratory results, and diagnostic images.  ? ?I met with Rajat this morning.  He expressed wanting to get OOB to the chair. He was able to get up this morning with assistance of the nursing technician. He was very fatigued and short of breath after this movement. He agree's to take dilaudid if needed to support the symptom of dyspnea.Patient complains of little to no appetite.  ? ?Makar continues to be in agreement with the plan for comfort focused care at this point in time.  ? ?Objective Assessment: ?Vital Signs ?Vitals:  ? 07/19/21 2142 07/20/21 0852  ?BP: (!) 156/138   ?Pulse: (!) 101   ?Resp: 18   ?Temp: (!) 97.5 ?F (36.4 ?C) (!)  97.4 ?F (36.3 ?C)  ?SpO2: (!) 86%   ? ? ?Intake/Output Summary (Last 24 hours) at 07/20/2021 0934 ?Last data filed at 07/20/2021 0600 ?Gross per 24 hour  ?Intake 360 ml  ?Output 2700 ml  ?Net -2340 ml  ? ? ?Last Weight  Most recent update: 07/19/2021  4:03 AM  ? ? Weight  ?92.5 kg (203 lb 14.8 oz)  ?      ? ?  ? ?Gen: Frail elderly male in no acute distress ?HEENT: moist mucous membranes ?CV: Irregular rate and rhythm ?PULM: On 2LPM, breathing is labored ?ABD: soft/nontender  ?EXT: No edema, RUE and LE weakness ?Neuro: Alert and oriented x3  ? ?SUMMARY OF RECOMMENDATIONS   ?DNAR/DNI ? ?Comfort focused care ? ?Comfort medications per MAR - Added dilaudid BID for dyspnea ? ?TOC - Appreciate support in helping family navigate next steps right now. Condition could certainly change at anytime in the setting of having stopped his antibiotics & profound anemia. Noted to also have little to no appetite.  ? ?Ongoing Palliative support ? ?MDM - High ?______________________________________________________________________________________ ?Tacey Ruiz ?[place] Team ?Team Cell Phone: [phone] ?Please utilize secure chat with additional questions, if there is no response within 30 minutes please call the above phone number ? ?Palliative Medicine Team providers are available by phone from 7am to 7pm daily and can be reached through the team cell phone.  ?Should this patient require assistance outside of these hours, please call the patient's attending physician. ? ? ? ? ?

## 2021-07-20 NOTE — Progress Notes (Signed)
PROGRESS NOTE    [name]  VOJ:[id number] DOB: 12/02/1930 DOA: 07/16/2021 PCP: Leonides Sake, MD   Brief Narrative:  [name] is a 88 y.o. Caucasian male with medical history significant for AAA, anemia, combined systolic and diastolic CHF, coronary artery disease status post CABG, essential hypertension, dyslipidemia with type 2 diabetes mellitus, who presented to the emergency room with a consult of Fall and subsequent biliary drain dislodgment.  The patient had a cholecystitis in October at which time he was not thought to be a good surgical candidate given his multiple medical problems and therefore a biliary drain by IR was placed and he was given IV antibiotics and has been stable since then.  About a couple weeks ago he fell from his chair but it was forceful enough to dislodge his biliary drain at that time.  He admitted to worsening cough over the last couple weeks occasionally productive of brownish sputum, with mild dyspnea or wheezing.  No nausea or vomiting or abdominal pain.  No chest pain or palpitations.  His suprapubic catheter has been draining normally.   Patient this time was admitted secondary to acute hypoxic respiratory failure secondary to acute on chronic systolic CHF as well as sepsis secondary to healthcare associated pneumonia.  He was started on diuretics as well as antibiotics.  IR was consulted for replacement of the biliary drain but they did not think that it was needed anymore and they decided to related be as it is.  Unfortunately, on the evening of 07/18/2021, patient was noted to have sudden flaccid paralysis of the right side of the body however he remained fully alert during whole encounter.  Code stroke was called, neurology saw patient.  CT head was obtained which was negative but MRI showed left-sided acute ischemic stroke.  Patient also came in with low blood his baseline hemoglobin around 6.2 which further dropped to 4.8.  Patient and family continued  to decline blood transfusion as well.  Due to having multiple medical problems, patient had very poor prognosis for which palliative care was consulted and after having a meeting with the family, they mutually decided to transition patient to comfort care on the afternoon of 07/19/2021.  Assessment & Plan:   Active Problems:   Diabetes mellitus type 2 in obese (HCC)   Hyperlipidemia associated with type 2 diabetes mellitus ([place])   Coronary artery disease involving native coronary artery of native heart with angina pectoris [facility])   Essential hypertension   Coronary artery disease involving coronary bypass graft of native heart with angina pectoris ([place])   Anemia due to chronic kidney disease   Acute on chronic systolic CHF (congestive heart failure) ([place])   Community acquired pneumonia   Biliary drain displacement   Sepsis due to pneumonia ([place])   Cerebral embolism with cerebral infarction  Acute hypoxic respiratory failure secondary to Acute on chronic systolic CHF, POA: Echo done in December 2022 shows 45 to 50% ejection fraction with global hypokinesis.  Currently requiring 2 L of oxygen.  He remains on IV Lasix.  He was transitioned to comfort care on 07/19/2021.   Sepsis secondary to Healthcare associated pneumonia: Patient qualified for healthcare associated pneumonia diagnosis since he was admitted in the hospital for more than 3 days within the last 90 days.  He met sepsis criteria based on fever, tachycardia and tachypnea.  Cultures are negative so far.  He was on cefepime which was discontinued on 07/19/2021 when he was transitioned to comfort  care.  Biliary drain displacement: Consulted IR.  They assessed patient.  Per them, they were going to remove the drain in the middle of March and that currently patient does not have any symptoms regarding acute cholecystitis so they will leave that out for now.  Monitor LFTs.  COVID-19 infection: Patient has tested positive this admission but it  appears that he was also positive about 2 months ago so this is likely continuation of the previous positive test.  No indication of treatment for COVID-19 at this point in time.  Discussed with infection control, since this is continuation of his previous COVID-19 positive test, isolation was discontinued on 07/18/2021 per their recommendations.  Diabetes mellitus type 2 in obese [facility])- (present on admission): Hemoglobin A1c 6.9.  He is only on metformin at home which is on hold.     Anemia due to chronic kidney disease: Patient's baseline hemoglobin is 6.  Patient is Jehovah's Witness.  Hemoglobin dropped to 5.2 yesterday and further down to 4.8 today.  Discussed in length with the patient and his daughter and they are declining blood transfusion.  They were made aware by me and have been made aware by many physicians in the past that this sort of severe anemia can be very dangerous and can cause death.  They verbalized understanding and still declined blood transfusion.  Essential hypertension: Controlled.  Continue home medications.   Coronary artery disease involving native coronary artery of native heart with angina pectoris ([place])- (present on admission): Asymptomatic - We will continue Plavix, Imdur, as needed sublingual nitroglycerin, Coreg and statin therapy.   Hyperlipidemia: Continue statin.  Acute ischemic stroke: Left-sided watershed infarct of MCA territory likely secondary to occlusion or stenosis source in the setting of severe anemia.  MRI confirmed this.  CT head was initially negative.  Carotid Doppler negative for severe stenosis.  2D echo shows EF of 45 to 50%.  Global left ventricular hypokinesis.  Neurology on board.  Patient currently on Plavix.  Now comfort care.  CKD stage IV: At baseline.  Goal of care: Palliative care consulted.  Family had meeting with them and patient was transitioned to comfort care on the evening of 07/19/2021.  DVT prophylaxis:    Code Status: DNR   Family Communication:  None present at bedside.  Left a voicemail to the daughter.  Status is: Inpatient Remains inpatient appropriate because: He needs inpatient management.   Estimated body mass index is 26.9 kg/m as calculated from the following:   Height as of this encounter: $RemoveBeforeD'6\' 1"'[id number]$  (1.854 m).   Weight as of this encounter: 92.5 kg.  Pressure Injury 05/05/21 Buttocks Right Stage 1 -  Intact skin with non-blanchable redness of a localized area usually over a bony prominence. small open area on right buttocks (Active)  05/05/21 1700  Location: Buttocks  Location Orientation: Right  Staging: Stage 1 -  Intact skin with non-blanchable redness of a localized area usually over a bony prominence.  Wound Description (Comments): small open area on right buttocks  Present on Admission:    Nutritional Assessment: Body mass index is 26.9 kg/m.Marland Kitchen Seen by dietician.  I agree with the assessment and plan as outlined below: Nutrition Status:        . Skin Assessment: I have examined the patient's skin and I agree with the wound assessment as performed by the wound care RN as outlined below: Pressure Injury 05/05/21 Buttocks Right Stage 1 -  Intact skin with non-blanchable redness of a localized area  usually over a bony prominence. small open area on right buttocks (Active)  05/05/21 1700  Location: Buttocks  Location Orientation: Right  Staging: Stage 1 -  Intact skin with non-blanchable redness of a localized area usually over a bony prominence.  Wound Description (Comments): small open area on right buttocks  Present on Admission:     Consultants:  None  Procedures:  None  Antimicrobials:  Anti-infectives (From admission, onward)    Start     Dose/Rate Route Frequency Ordered Stop   07/18/21 0400  azithromycin (ZITHROMAX) 500 mg in sodium chloride 0.9 % 250 mL IVPB  Status:  Discontinued        500 mg 250 mL/hr over 60 Minutes Intravenous Every 24 hours 07/17/21 0512  07/19/21 1557   07/18/21 0000  cefTRIAXone (ROCEPHIN) 2 g in sodium chloride 0.9 % 100 mL IVPB  Status:  Discontinued        2 g 200 mL/hr over 30 Minutes Intravenous Every 24 hours 07/17/21 0512 07/17/21 1607   07/17/21 1800  ceFEPIme (MAXIPIME) 2 g in sodium chloride 0.9 % 100 mL IVPB  Status:  Discontinued        2 g 200 mL/hr over 30 Minutes Intravenous Every 24 hours 07/17/21 1656 07/19/21 1557   07/17/21 0500  cefTRIAXone (ROCEPHIN) 2 g in sodium chloride 0.9 % 100 mL IVPB  Status:  Discontinued        2 g 200 mL/hr over 30 Minutes Intravenous Every 24 hours 07/17/21 0452 07/17/21 0512   07/17/21 0500  azithromycin (ZITHROMAX) 500 mg in sodium chloride 0.9 % 250 mL IVPB  Status:  Discontinued        500 mg 250 mL/hr over 60 Minutes Intravenous Every 24 hours 07/17/21 0452 07/17/21 0512   07/17/21 0130  cefTRIAXone (ROCEPHIN) 2 g in sodium chloride 0.9 % 100 mL IVPB        2 g 200 mL/hr over 30 Minutes Intravenous  Once 07/17/21 0128 07/17/21 0318   07/17/21 0130  azithromycin (ZITHROMAX) 500 mg in sodium chloride 0.9 % 250 mL IVPB  Status:  Discontinued        500 mg 250 mL/hr over 60 Minutes Intravenous Every 24 hours 07/17/21 0128 07/17/21 0527         Subjective:  Patient seen and examined.  He is fully alert and oriented and is in good mood.  Still has right-sided weakness, he wanted to get up and sit at the edge of the bed.  RN was requested to help the patient.  Objective: Vitals:   07/19/21 1640 07/19/21 1659 07/19/21 2142 07/20/21 0852  BP: 119/64 125/71 (!) 156/138   Pulse: 81  (!) 101   Resp: 18  18   Temp: 97.9 F (36.6 C)  (!) 97.5 F (36.4 C) (!) 97.4 F (36.3 C)  TempSrc: Oral  Oral Oral  SpO2: 92%  (!) 86%   Weight:      Height:        Intake/Output Summary (Last 24 hours) at 07/20/2021 0942 Last data filed at 07/20/2021 0600 Gross per 24 hour  Intake 360 ml  Output 2700 ml  Net -2340 ml    Filed Weights   07/17/21 0500 07/18/21 0037 07/19/21 0400   Weight: 94.8 kg 96.8 kg 92.5 kg    Examination:  General exam: Appears calm and comfortable  Respiratory system: Clear to auscultation. Respiratory effort normal. Cardiovascular system: S1 & S2 heard, RRR. No JVD, murmurs, rubs, gallops or clicks.  No pedal edema. Gastrointestinal system: Abdomen is nondistended, soft and nontender. No organomegaly or masses felt. Normal bowel sounds heard. Central nervous system: Alert and oriented.  0/5 power in right upper extremity and 3/5 power in right lower extremity. Extremities: Symmetric 5 x 5 power. Skin: No rashes, lesions or ulcers.  Psychiatry: Judgement and insight appear normal. Mood & affect appropriate.    Data Reviewed: I have personally reviewed following labs and imaging studies  CBC: Recent Labs  Lab 07/17/21 0057 07/17/21 0700 07/18/21 0423 07/19/21 0332  WBC 13.3* 17.1* 7.8 5.1  NEUTROABS 9.7*  --  5.1 3.2  HGB 6.4* 5.2* 4.8* 4.8*  HCT 18.8* 15.9* 14.8* 14.6*  MCV 94.5 95.2 94.3 93.6  PLT 372 283 236 409    Basic Metabolic Panel: Recent Labs  Lab 07/17/21 0057 07/17/21 0700 07/18/21 0423 07/19/21 0332  NA 135 136 132* 133*  K 3.8 3.8 3.8 3.9  CL 99 99 98 96*  CO2 $Re'24 26 25 25  'PMf$ GLUCOSE 193* 144* 110* 152*  BUN 31* 31* 35* 39*  CREATININE 2.46* 2.47* 2.36* 2.75*  CALCIUM 8.7* 8.3* 8.1* 8.3*  MG  --   --  1.9  --     GFR: Estimated Creatinine Clearance: 21 mL/min (A) (by C-G formula based on SCr of 2.75 mg/dL (H)). Liver Function Tests: Recent Labs  Lab 07/17/21 0057 07/17/21 0700 07/18/21 0423 07/19/21 0332  AST 16 18 13* 12*  ALT $Re'10 10 9 7  'Oer$ ALKPHOS 61 52 50 50  BILITOT 2.6* 1.1 0.8 0.6  PROT 6.6 5.6* 5.5* 5.5*  ALBUMIN 3.7 3.1* 2.8* 2.8*    No results for input(s): LIPASE, AMYLASE in the last 168 hours. No results for input(s): AMMONIA in the last 168 hours. Coagulation Profile: No results for input(s): INR, PROTIME in the last 168 hours. Cardiac Enzymes: No results for input(s): CKTOTAL,  CKMB, CKMBINDEX, TROPONINI in the last 168 hours. BNP (last 3 results) No results for input(s): PROBNP in the last 8760 hours. HbA1C: No results for input(s): HGBA1C in the last 72 hours.  CBG: Recent Labs  Lab 07/18/21 1719 07/18/21 2122 07/19/21 0557 07/19/21 1135 07/19/21 1638  GLUCAP 179* 168* 135* 231* 197*    Lipid Profile: Recent Labs    07/19/21 0332  CHOL 83  HDL 38*  LDLCALC 35  TRIG 52  CHOLHDL 2.2    Thyroid Function Tests: No results for input(s): TSH, T4TOTAL, FREET4, T3FREE, THYROIDAB in the last 72 hours. Anemia Panel: No results for input(s): VITAMINB12, FOLATE, FERRITIN, TIBC, IRON, RETICCTPCT in the last 72 hours. Sepsis Labs: Recent Labs  Lab 07/17/21 0057 07/17/21 0257 07/17/21 1646  PROCALCITON  --   --  3.53  LATICACIDVEN 1.5 1.2  --      Recent Results (from the past 240 hour(s))  Blood culture (routine x 2)     Status: None (Preliminary result)   Collection Time: 07/17/21  1:40 AM   Specimen: BLOOD  Result Value Ref Range Status   Specimen Description BLOOD LEFT ANTECUBITAL  Final   Special Requests   Final    BOTTLES DRAWN AEROBIC AND ANAEROBIC Blood Culture adequate volume   Culture   Final    NO GROWTH 2 DAYS Performed at [facility], [address] [place], [place] [address]    Report Status PENDING  Incomplete  Blood culture (routine x 2)     Status: None (Preliminary result)   Collection Time: 07/17/21  1:46 AM  Specimen: BLOOD  Result Value Ref Range Status   Specimen Description BLOOD BLOOD LEFT WRIST  Final   Special Requests   Final    BOTTLES DRAWN AEROBIC AND ANAEROBIC Blood Culture adequate volume   Culture   Final    NO GROWTH 2 DAYS Performed at [facility], [address] [place], [place] [address]    Report Status PENDING  Incomplete  Resp Panel by RT-PCR (Flu A&B, Covid) Nasopharyngeal Swab     Status: Abnormal   Collection Time: 07/17/21 11:54 AM   Specimen: Nasopharyngeal Swab;  Nasopharyngeal(NP) swabs in vial transport medium  Result Value Ref Range Status   SARS Coronavirus 2 by RT PCR POSITIVE (A) NEGATIVE Final    Comment: (NOTE) SARS-CoV-2 target nucleic acids are DETECTED.  The SARS-CoV-2 RNA is generally detectable in upper respiratory specimens during the acute phase of infection. Positive results are indicative of the presence of the identified virus, but do not rule out bacterial infection or co-infection with other pathogens not detected by the test. Clinical correlation with patient history and other diagnostic information is necessary to determine patient infection status. The expected result is Negative.  Fact Sheet for Patients: EntrepreneurPulse.com.au  Fact Sheet for Healthcare Providers: IncredibleEmployment.be  This test is not yet approved or cleared by the [place] FDA and  has been authorized for detection and/or diagnosis of SARS-CoV-2 by FDA under an Emergency Use Authorization (EUA).  This EUA will remain in effect (meaning this test can be used) for the duration of  the COVID-19 declaration under Section 564(b)(1) of the A ct, 21 U.S.C. section 360bbb-3(b)(1), unless the authorization is terminated or revoked sooner.     Influenza A by PCR NEGATIVE NEGATIVE Final   Influenza B by PCR NEGATIVE NEGATIVE Final    Comment: (NOTE) The Xpert Xpress SARS-CoV-2/FLU/RSV plus assay is intended as an aid in the diagnosis of influenza from Nasopharyngeal swab specimens and should not be used as a sole basis for treatment. Nasal washings and aspirates are unacceptable for Xpert Xpress SARS-CoV-2/FLU/RSV testing.  Fact Sheet for Patients: EntrepreneurPulse.com.au  Fact Sheet for Healthcare Providers: IncredibleEmployment.be  This test is not yet approved or cleared by the [place] FDA and has been authorized for detection and/or diagnosis of SARS-CoV-2  by FDA under an Emergency Use Authorization (EUA). This EUA will remain in effect (meaning this test can be used) for the duration of the COVID-19 declaration under Section 564(b)(1) of the Act, 21 U.S.C. section 360bbb-3(b)(1), unless the authorization is terminated or revoked.  Performed at [facility], [place] [address] [place], [place] [address]       Radiology Studies: MR BRAIN WO CONTRAST  Result Date: 07/18/2021 CLINICAL DATA:  Acute neurologic deficit EXAM: MRI HEAD WITHOUT CONTRAST TECHNIQUE: Multiplanar, multiecho pulse sequences of the brain and surrounding structures were obtained without intravenous contrast. COMPARISON:  None. FINDINGS: Brain: Multifocal acute ischemia within the left hemisphere watershed distribution. There is a single focus of acute ischemia in the posterior right parietal lobe. No acute or chronic hemorrhage. Hyperintense T2-weighted signal is moderately widespread throughout the white matter. Generalized volume loss without a clear lobar predilection. Old left frontal small vessel infarct. The midline structures are normal. Vascular: Major flow voids are preserved. Skull and upper cervical spine: Normal calvarium and skull base. Visualized upper cervical spine and soft tissues are normal. Sinuses/Orbits:No paranasal sinus fluid levels or advanced mucosal thickening. No mastoid or middle ear effusion. Normal orbits. IMPRESSION: 1. Multifocal  acute ischemia within the left hemisphere watershed distribution. Suspect single focus of acute ischemia in the posterior right parietal lobe. No hemorrhage or mass effect. 2. Moderate chronic small vessel ischemic disease and volume loss. Electronically Signed   By: Ulyses Jarred M.D.   On: 07/18/2021 23:04   CT HEAD CODE STROKE WO CONTRAST  Result Date: 07/18/2021 CLINICAL DATA:  Code stroke. EXAM: CT HEAD WITHOUT CONTRAST TECHNIQUE: Contiguous axial images were obtained from the base of the skull through the vertex  without intravenous contrast. RADIATION DOSE REDUCTION: This exam was performed according to the departmental dose-optimization program which includes automated exposure control, adjustment of the mA and/or kV according to patient size and/or use of iterative reconstruction technique. COMPARISON:  Brain MRI 05/05/2021, CT head 05/05/2021 FINDINGS: Brain: There is no evidence of acute intracranial hemorrhage, extra-axial fluid collection, or acute infarct. Background parenchymal volume loss with prominence of the ventricular system and extra-axial CSF spaces is unchanged. The ventricles are stable in size. Remote infarcts in the left frontoparietal region and left frontal lobe subcortical white matter are unchanged. There is no mass lesion.  There is no midline shift. Vascular: There is calcification of the bilateral cavernous ICAs and vertebral arteries. Skull: Normal. Negative for fracture or focal lesion. Sinuses/Orbits: There is mild mucosal thickening in the maxillary sinuses. Bilateral lens implants are in place. The globes and orbits are otherwise unremarkable. Other: None. ASPECTS [facility] Stroke Program Early CT Score) - Ganglionic level infarction (caudate, lentiform nuclei, internal capsule, insula, M1-M3 cortex): 7 - Supraganglionic infarction (M4-M6 cortex): 3 Total score (0-10 with 10 being normal): 10 IMPRESSION: 1. No acute intracranial hemorrhage or infarct. 2. ASPECTS is 10 3. Stable remote infarcts on a background of parenchymal volume loss and chronic white matter microangiopathy as above. These results were paged via AMION at the time of interpretation on 07/18/2021 at 4:22 pm to provider [facility] . Electronically Signed   By: Valetta Mole M.D.   On: 07/18/2021 16:48    Scheduled Meds:  furosemide  40 mg Intravenous BID    HYDROmorphone (DILAUDID) injection  1 mg Intravenous BID   Continuous Infusions:     LOS: 3 days   Time spent: 24 minutes  Darliss Cheney, MD [facility]  07/20/2021, 9:42 AM  Please page via [place] [place] and do not message via secure chat for urgent patient care matters. Secure chat can be used for non urgent patient care matters.  How to contact the [facility] Attending or Consulting provider [place] or covering provider during after hours [place], for this patient?  Check the care team in [facility] and look for a) attending/consulting [facility] provider listed and b) the [facility] team listed. Page or secure chat [phone]. Log into [email or web address] and use [place]'s universal password to access. If you do not have the password, please contact the hospital operator. Locate the [facility] provider you are looking for under [facility] and page to a number that you can be directly reached. If you still have difficulty reaching the provider, please page the [facility] (Director on Call) for the Hospitalists listed on amion for assistance.

## 2021-07-20 NOTE — Progress Notes (Addendum)
Manufacturing engineer [facility]) Hospital Liaison Note ? ?Referral received for patient/family interest in [facility].  ? ?Chart under review by [facility] physician.  ? ?Hospice eligibility confirmed.  ? ?Unfortunately, [facility] is not able to offer a bed today. If a bed becomes available sooner [facility] liaison will reach out.  ? ?Please call with any questions or concerns. Thank you ? ?Roselee Nova, LCSW ?[facility] Liaison ?[phone] ?

## 2021-07-20 NOTE — TOC Progression Note (Signed)
Transition of Care (TOC) - Initial/Assessment Note  ? ? ?Patient Details  ?Name: [name] ?MRN: [id number] ?Date of Birth: 1931/01/21 ? ?Transition of Care (TOC) CM/SW Contact:    ?Paulene Floor Tanyon Alipio, LCSWA ?Phone Number: ?07/20/2021, 9:56 AM ? ?Clinical Narrative:                 ?CSW contacted [facility] and requested that they screen to patient to see if patient meets the criteria for inpatient hospice and is awaiting the determination.  ? ?  ?  ? ? ?Patient Goals and CMS Choice ?  ?  ?  ? ?Expected Discharge Plan and Services ?  ?  ?  ?  ?  ?                ?  ?  ?  ?  ?  ?  ?  ?  ?  ?  ? ?Prior Living Arrangements/Services ?  ?  ?  ?       ?  ?  ?  ?  ? ?Activities of Daily Living ?Home Assistive Devices/Equipment: Wheelchair ?ADL Screening (condition at time of admission) ?Patient's cognitive ability adequate to safely complete daily activities?: Yes ?Is the patient deaf or have difficulty hearing?: No ?Does the patient have difficulty seeing, even when wearing glasses/contacts?: No ?Does the patient have difficulty concentrating, remembering, or making decisions?: No ?Patient able to express need for assistance with ADLs?: No ?Does the patient have difficulty dressing or bathing?: Yes ?Independently performs ADLs?: No ?Communication: Needs assistance ?Is this a change from baseline?: Pre-admission baseline ?Dressing (OT): Needs assistance ?Is this a change from baseline?: Pre-admission baseline ?Grooming: Needs assistance ?Is this a change from baseline?: Pre-admission baseline ?Feeding: Independent ?Bathing: Needs assistance ?Is this a change from baseline?: Pre-admission baseline ?Toileting: Needs assistance ?Is this a change from baseline?: Pre-admission baseline ?In/Out Bed: Needs assistance ?Is this a change from baseline?: Pre-admission baseline ?Walks in Home: Needs assistance ?Is this a change from baseline?: Pre-admission baseline ?Does the patient have difficulty walking or climbing stairs?:  Yes ?Weakness of Legs: Both ?Weakness of Arms/Hands: Right ? ?Permission Sought/Granted ?  ?  ?   ?   ?   ?   ? ?Emotional Assessment ?  ?  ?  ?  ?  ?  ? ?Admission diagnosis:  Acute CHF (congestive heart failure) ([place]) [I50.9] ?Patient Active Problem List  ? Diagnosis Date Noted  ? Cerebral embolism with cerebral infarction 07/19/2021  ? Acute CHF (congestive heart failure) ([place]) 07/17/2021  ? Acute on chronic systolic CHF (congestive heart failure) ([place]) 07/17/2021  ? Community acquired pneumonia 07/17/2021  ? Biliary drain displacement 07/17/2021  ? Sepsis due to pneumonia ([place]) 07/17/2021  ? Lacunar infarction ([place]) 05/13/2021  ? Cerebral thrombosis with cerebral infarction 05/06/2021  ? Pressure injury of skin 05/06/2021  ? COVID-19 04/29/2021  ? DNR (do not resuscitate) 04/29/2021  ? Accelerated hypertension 11/20/2019  ? Peripheral arterial disease ([place]) 11/29/2018  ? Anemia due to chronic kidney disease   ? CKD (chronic kidney disease), stage III ([place])   ? Poorly controlled type 2 diabetes mellitus with peripheral neuropathy ([place])   ? Left middle cerebral artery stroke ([place]) 05/27/2018  ? Vitamin D deficiency 05/27/2018  ? Acute CVA (cerebrovascular accident) ([place]) 05/25/2018  ? Right arm weakness 05/25/2018  ? Right sided weakness   ? History of CVA with residual deficit   ? Acute blood loss anemia   ? Dilated cardiomyopathy ([place])  04/26/2018  ? Fatigue 04/26/2018  ? Leukocytosis 04/17/2018  ? Benign hypertensive heart disease with diastolic CHF, NYHA class 2 ([place]) 04/17/2018  ? History of suprapubic catheter 04/17/2018  ? Chronic anemia 04/17/2018  ? Chest pain 01/28/2018  ? Pseudomonas urinary tract infection   ? Hydrocele, right   ? Olecranon bursitis of left elbow   ? CKD (chronic kidney disease) stage 3, GFR 30-59 ml/min (HCC)   ? Suprapubic catheter ([place])   ? History of urethral stricture   ? Hemispheric carotid artery syndrome 12/22/2016  ? AKI (acute kidney injury) ([place]) 03/28/2016  ? S/P angioplasty  with stent 03/26/16 to VG to PDA for in-stent restenosis with DES. 03/28/2016  ? History of ST elevation myocardial infarction (STEMI) 03/28/2016  ? Carpal tunnel syndrome of right wrist 06/24/2015  ? Diabetes mellitus treated with oral medication ([place]) 03/21/2015  ? Stenosis of left carotid artery   ? Resistant hypertension   ? Hyperlipidemia LDL goal <70   ? Cerebral infarction due to stenosis of left middle cerebral artery ([place]) 01/22/2015  ? CAD S/P percutaneous coronary angioplasty 09/19/2014  ? Coronary artery disease involving coronary bypass graft of native heart with angina pectoris ([place]) 09/19/2014  ? Shortness of breath 09/19/2014  ? Polypharmacy 09/19/2014  ? Abdominal aortic aneurysm   ? Essential hypertension 06/26/2014  ? History of stroke   ? Hyperlipidemia associated with type 2 diabetes mellitus ([place])   ? Type II diabetes mellitus with complication (HCC)   ? Coronary artery disease involving native coronary artery of native heart with angina pectoris ([place])   ? Abnormal TSH 06/19/2014  ? Obesity 06/19/2014  ? STEMI (ST elevation myocardial infarction) ([place]) 06/18/2014  ? NSTEMI (non-ST elevated myocardial infarction) ([place]) 06/17/2014  ? Bilateral lower extremity edema 03/30/2014  ? Dizziness 02/21/2014  ? Bradycardia 08/31/2013  ? Postoperative atrial fibrillation (HCC)   ?  Class: Temporary  ? Diabetes mellitus type 2 in obese [facility])   ? Apnea, sleep   ? Balanitis xerotica obliterans   ? History of: Non-STEMI (non-ST elevated myocardial infarction) 11/16/2010  ?  Class: History of  ? S/P CABG x 06-2010 11/16/2010  ? ?PCP:  Leonides Sake, MD ?Pharmacy:   ?[facility] [address], [place] ?[place] ?[place] [address] ?Phone: [phone] Fax: [phone] ? ?[place], [place] ?[place] [address] ?Phone: [phone] Fax: [phone] ? ?[facility] ?[address] ?[place] [address] ?Phone: [phone] Fax: [phone] ? ? ? ? ?Social Determinants of Health (SDOH) Interventions ?  ? ?Readmission Risk Interventions ?No flowsheet data found. ? ? ?

## 2021-07-21 DIAGNOSIS — A419 Sepsis, unspecified organism: Principal | ICD-10-CM

## 2021-07-21 LAB — GLUCOSE, CAPILLARY: Glucose-Capillary: 200 mg/dL — ABNORMAL HIGH (ref 70–99)

## 2021-07-21 NOTE — TOC Progression Note (Addendum)
Transition of Care (TOC) - Progression Note  ? ? ?Patient Details  ?Name: [name] ?MRN: [id number] ?Date of Birth: January 17, 1931 ? ?Transition of Care (TOC) CM/SW Contact  ?Zenon Mayo, RN ?Phone Number: ?07/21/2021, 10:42 AM ? ?Clinical Narrative:    ?Awaiting to hear from [facility] with [facility] to see when consent has been signed, then can call ptar. CSW calling [facility] for transport. ? ? ?  ?  ? ?Expected Discharge Plan and Services ?  ?  ?  ?  ?  ?Expected Discharge Date: 07/21/21               ?  ?  ?  ?  ?  ?  ?  ?  ?  ?  ? ? ?Social Determinants of Health (SDOH) Interventions ?  ? ?Readmission Risk Interventions ?No flowsheet data found. ? ?

## 2021-07-21 NOTE — Care Management Important Message (Signed)
Important Message ? ?Patient Details  ?Name: [name] ?MRN: [id number] ?Date of Birth: Nov 13, 1930 ? ? ?Medicare Important Message Given:  Yes ? ? ? ? ?Shelda Altes ?07/21/2021, 9:12 AM ?

## 2021-07-21 NOTE — Progress Notes (Addendum)
? ?  Palliative Medicine Inpatient Follow Up Note ? ?HPI: ?88 y.o. 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  He was also found to be dyspneic and with a cough over the last few weeks.  WBC showed white count up at 17.1 anemia at 6.4.  Determined to be septic from healthcare acquired pneumonia.  Also with acute on chronic heart failure exacerbation.  Admitted for diuresis and IV antibiotics.  Palliative medicine consulted for goals of care due to his ongoing severe anemia hemoglobin today was 4.8.  He he declines blood transfusion products due to his faith tradition of Jehov[employer]'s Witness.  Palliative care was consulted to have more dedicated goals of care conversations. ? ?Today's Discussion (07/21/2021): ? ?*Please note that this is a verbal dictation therefore any spelling or grammatical errors are due to the "[place]" system interpretation. ? ?Chart reviewed inclusive of vital signs, progress notes, laboratory results, and diagnostic images.  ? ?I met with Vasilios this morning. He is resting in bed in NAD. He denies pain, shortness of breath, and nausea.  ? ?I reviewed with Demarea the plan to transition to [facility] today which he is in agreement with.  ? ?Objective Assessment: ?Vital Signs ?Vitals:  ? 07/20/21 2203 07/21/21 1034  ?BP:  (!) 148/69  ?Pulse: 71 68  ?Resp:  16  ?Temp:  98.6 ?F (37 ?C)  ?SpO2: 96% (!) 88%  ? ? ?Intake/Output Summary (Last 24 hours) at 07/21/2021 1140 ?Last data filed at 07/21/2021 5456 ?Gross per 24 hour  ?Intake 140 ml  ?Output 950 ml  ?Net -810 ml  ? ? ?Last Weight  Most recent update: 07/19/2021  4:03 AM   ? ? Weight  ?92.5 kg (203 lb 14.8 oz)  ?      ? ?  ? ?Gen: Frail elderly male in no acute distress ?HEENT: moist mucous membranes ?CV: Irregular rate and rhythm ?PULM: On 2LPM, breathing is labored ?ABD: soft/nontender  ?EXT: No edema, RUE and LE weakness ?Neuro: Alert and oriented x3  ? ?SUMMARY OF RECOMMENDATIONS   ?DNAR/DNI ? ?Comfort focused care ? ?Comfort medications per [facility]  ? ?Continue Dilaudid BID for dyspnea - nursing can hold if no active symptoms ? ?TOC - Appreciate support in helping family navigate next steps right now. Condition could certainly change at anytime in the setting of having stopped his antibiotics & profound anemia. ? ?Plan for transition to [facility] ? ?MDM - High ?______________________________________________________________________________________ ?Tacey Ruiz ?[place] Team ?Team Cell Phone: [phone] ?Please utilize secure chat with additional questions, if there is no response within 30 minutes please call the above phone number ? ?Palliative Medicine Team providers are available by phone from 7am to 7pm daily and can be reached through the team cell phone.  ?Should this patient require assistance outside of these hours, please call the patient's attending physician. ? ? ? ? ?

## 2021-07-21 NOTE — Progress Notes (Signed)
Report given to Helene Kelp at [facility], all questions answered . Patient waiting [facility] to transport the patient. ?

## 2021-07-21 NOTE — Progress Notes (Signed)
Heart Failure Navigator Progress Note ? ?Assessed for Heart & Vascular TOC clinic readiness.  ?Patient does not meet criteria due to planned discharge to [facility]..  ? ? ?Earnestine Leys, BSN, RN ?Heart Failure Nurse Navigator ?[phone]  ? ? ?

## 2021-07-21 NOTE — Discharge Summary (Signed)
PatientPhysician Discharge Summary  [name] DOB: November 26, 1930 DOA: 07/16/2021  PCP: Leonides Sake, MD  Admit date: 07/16/2021 Discharge date: 07/21/2021 30 Day Unplanned Readmission Risk Score    Flowsheet Row ED to Hosp-Admission (Current) from 07/16/2021 in [facility]U  30 Day Unplanned Readmission Risk Score (%) 30.27 Filed at 07/21/2021 0801       This score is the patient's risk of an unplanned readmission within 30 days of being discharged (0 -100%). The score is based on dignosis, age, lab data, medications, orders, and past utilization.   Low:  0-14.9   Medium: 15-21.9   High: 22-29.9   Extreme: 30 and above          Admitted From: Home  Disposition:  Hospice[facility]  Recommendations for Outpatient Follow-up:  Follow up with PCP in 1-2 weeks Please obtain BMP/CBC in one week Please follow up with your PCP on the following pending results: Unresulted Labs (From admission, onward)    None       Discharge Condition: Guarded CODE STATUS: DNR Diet recommendation: As pleased  Subjective: Seen and examined.  No complaints.  Brief/Interim Summary: [name] is a 88 y.o. Caucasian male with medical history significant for AAA, anemia, combined systolic and diastolic CHF, coronary artery disease status post CABG, essential hypertension, dyslipidemia with type 2 diabetes mellitus, who presented to the emergency room with a consult of Fall and subsequent biliary drain dislodgment.  The patient had a cholecystitis in October at which time he was not thought to be a good surgical candidate given his multiple medical problems and therefore a biliary drain by IR was placed and he was given IV antibiotics and has been stable since then.  About a couple weeks ago he fell from his chair but it was forceful enough to dislodge his biliary drain at that time.  He admitted to worsening cough over the last couple weeks occasionally productive of  brownish sputum, with mild dyspnea or wheezing.  No nausea or vomiting or abdominal pain.  No chest pain or palpitations.  His suprapubic catheter has been draining normally.   Patient this time was admitted secondary to acute hypoxic respiratory failure secondary to acute on chronic systolic CHF as well as sepsis secondary to healthcare associated pneumonia.  He was started on diuretics as well as antibiotics.  IR was consulted for replacement of the biliary drain but they did not think that it was needed anymore and they decided to related be as it is.  Unfortunately, on the evening of 07/18/2021, patient was noted to have sudden flaccid paralysis of the right side of the body however he remained fully alert during whole encounter.  Code stroke was called, neurology saw patient.  CT head was obtained which was negative but MRI showed left-sided acute ischemic stroke.  Patient also came in with low blood his baseline hemoglobin around 6.2 which further dropped to 4.8.  Patient and family continued to decline blood transfusion as well.  Due to having multiple medical problems, patient had very poor prognosis for which palliative care was consulted and after having a meeting with the family, they mutually decided to transition patient to comfort care on the afternoon of 07/19/2021.  Patient is now being transferred[facility]charged [facility].  Discharge plan was discussed with patient and/or family member and they verbalized understanding and agreed with it.  Discharge Diagnoses:  Active Problems:   Diabetes mellitus type 2 in obese ([place])  Hyperlipidemia associated with type 2 diabetes mellitus ([place])   Coronary artery disease involving native coronary artery of native heart with angina pectoris [facility])   Essential hypertension   Coronary artery disease involving coronary bypass graft of native heart with angina pectoris ([place])   Anemia due to chronic kidney disease   Acute on chronic systolic CHF (congestive  heart failure) ([place])   Community acquired pneumonia   Biliary drain displacement   Sepsis due to pneumonia [facility])   Cerebral embolism with cerebral infarction    Discharge Instructions   Allergies as of 07/21/2021       Reactions   Atorvastatin Other (See Comments)   Myalgias   Crestor [rosuvastatin] Other (See Comments)   Myalgias    Other Other (See Comments)   No  BLOOD PRODUCTS - Pt is Jehovah's Witness   Pravastatin Other (See Comments)   Myalgias   Simvastatin Other (See Comments)   Myalgias   Statins Other (See Comments)   Cause muscle aches        Medication List     STOP taking these medications    amLODipine 10 MG tablet Commonly known as: NORVASC   carvedilol 6.25 MG tablet Commonly known as: COREG   furosemide 20 MG tablet Commonly known as: LASIX   hydrALAZINE 100 MG tablet Commonly known as: APRESOLINE   hydrALAZINE 25 MG tablet Commonly known as: APRESOLINE   isosorbide mononitrate 60 MG 24 hr tablet Commonly known as: IMDUR   metFORMIN 500 MG tablet Commonly known as: GLUCOPHAGE       TAKE these medications    acetaminophen 325 MG tablet Commonly known as: TYLENOL Take 2 tablets (650 mg total) by mouth every 6 (six) hours as needed for mild pain (or Fever >/= 101). What changed:  how much to take reasons to take this   albuterol 108 (90 Base) MCG/ACT inhaler Commonly known as: VENTOLIN HFA Inhale 2 puffs into the lungs every 2 (two) hours as needed for wheezing or shortness of breath.   clopidogrel 75 MG tablet Commonly known as: PLAVIX Take 1 tablet (75 mg total) by mouth daily. What changed: when to take this   doxazosin 4 MG tablet Commonly known as: CARDURA Take 1 tablet (4 mg total) by mouth daily. What changed: when to take this   FeroSul 325 (65 FE) MG tablet Generic drug: ferrous sulfate Take 1 tablet (325 mg total) by mouth daily. What changed: when to take this   folic acid 509 MCG tablet Commonly known as:  FOLVITE Take 400 mcg by mouth daily at 2 PM.   folic acid 1 MG tablet Commonly known as: FOLVITE Take 1 tablet (1 mg total) by mouth daily.   nitroGLYCERIN 0.4 MG SL tablet Commonly known as: NITROSTAT Place 0.4 mg under the tongue every 5 (five) minutes as needed for chest pain.   oxybutynin 5 MG tablet Commonly known as: DITROPAN Take 5 mg by mouth daily at 2 PM.   oxybutynin 10 MG 24 hr tablet Commonly known as: DITROPAN-XL Take 10 mg by mouth daily.   pantoprazole 40 MG tablet Commonly known as: PROTONIX Take 1 tablet (40 mg total) by mouth daily. What changed: when to take this   polyethylene glycol 17 g packet Commonly known as: MIRALAX / GLYCOLAX Take 17 g by mouth daily.   saccharomyces boulardii 250 MG capsule Commonly known as: FLORASTOR Take 1 capsule (250 mg total) by mouth 2 (two) times daily.   SM Mucus Relief 600  MG 12 hr tablet Generic drug: guaiFENesin Take 1 tablet (600 mg total) by mouth 2 (two) times daily.   sodium bicarbonate 650 MG tablet Take 1 tablet (650 mg total) by mouth 2 (two) times daily.   sodium chloride flush 0.9 % Soln injection 5 mLs by Intracatheter route every 12 (twelve) hours.   vitamin B-12 1000 MCG tablet Commonly known as: CYANOCOBALAMIN Take 1 tablet (1,000 mcg total) by mouth daily. What changed: when to take this   vitamin C 500 MG tablet Commonly known as: ASCORBIC ACID Take 1 tablet (500 mg total) by mouth daily. What changed: when to take this   Zinc Sulfate 220 (50 Zn) MG Tabs Take 1 tablet (220 mg total) by mouth daily. What changed: when to take this        Allergies  Allergen Reactions   Atorvastatin Other (See Comments)    Myalgias   Crestor [Rosuvastatin] Other (See Comments)    Myalgias    Other Other (See Comments)    No  BLOOD PRODUCTS - Pt is Jehovah's Witness   Pravastatin Other (See Comments)    Myalgias   Simvastatin Other (See Comments)    Myalgias    Statins Other (See Comments)     Cause muscle aches    Consultations: Palliative care, IR, neurology   Procedures/Studies: MR BRAIN WO CONTRAST  Result Date: 07/18/2021 CLINICAL DATA:  Acute neurologic deficit EXAM: MRI HEAD WITHOUT CONTRAST TECHNIQUE: Multiplanar, multiecho pulse sequences of the brain and surrounding structures were obtained without intravenous contrast. COMPARISON:  None. FINDINGS: Brain: Multifocal acute ischemia within the left hemisphere watershed distribution. There is a single focus of acute ischemia in the posterior right parietal lobe. No acute or chronic hemorrhage. Hyperintense T2-weighted signal is moderately widespread throughout the white matter. Generalized volume loss without a clear lobar predilection. Old left frontal small vessel infarct. The midline structures are normal. Vascular: Major flow voids are preserved. Skull and upper cervical spine: Normal calvarium and skull base. Visualized upper cervical spine and soft tissues are normal. Sinuses/Orbits:No paranasal sinus fluid levels or advanced mucosal thickening. No mastoid or middle ear effusion. Normal orbits. IMPRESSION: 1. Multifocal acute ischemia within the left hemisphere watershed distribution. Suspect single focus of acute ischemia in the posterior right parietal lobe. No hemorrhage or mass effect. 2. Moderate chronic small vessel ischemic disease and volume loss. Electronically Signed   By: Ulyses Jarred M.D.   On: 07/18/2021 23:04   DG Chest Portable 1 View  Result Date: 07/17/2021 CLINICAL DATA:  EVA EXAM: PORTABLE CHEST 1 VIEW COMPARISON:  04/29/2021 FINDINGS: Mild cardiomegaly. Small right pleural effusion and bibasilar atelectasis. Remote median sternotomy. IMPRESSION: Small right pleural effusion and bibasilar atelectasis. Electronically Signed   By: Ulyses Jarred M.D.   On: 07/17/2021 02:30   CT HEAD CODE STROKE WO CONTRAST  Result Date: 07/18/2021 CLINICAL DATA:  Code stroke. EXAM: CT HEAD WITHOUT CONTRAST TECHNIQUE: Contiguous  axial images were obtained from the base of the skull through the vertex without intravenous contrast. RADIATION DOSE REDUCTION: This exam was performed according to the departmental dose-optimization program which includes automated exposure control, adjustment of the mA and/or kV according to patient size and/or use of iterative reconstruction technique. COMPARISON:  Brain MRI 05/05/2021, CT head 05/05/2021 FINDINGS: Brain: There is no evidence of acute intracranial hemorrhage, extra-axial fluid collection, or acute infarct. Background parenchymal volume loss with prominence of the ventricular system and extra-axial CSF spaces is unchanged. The ventricles are stable in size. Remote  infarcts in the left frontoparietal region and left frontal lobe subcortical white matter are unchanged. There is no mass lesion.  There is no midline shift. Vascular: There is calcification of the bilateral cavernous ICAs and vertebral arteries. Skull: Normal. Negative for fracture or focal lesion. Sinuses/Orbits: There is mild mucosal thickening in the maxillary sinuses. Bilateral lens implants are in place. The globes and orbits are otherwise unremarkable. Other: None. ASPECTS [facility] Stroke Program Early CT Score) - Ganglionic level infarction (caudate, lentiform nuclei, internal capsule, insula, M1-M3 cortex): 7 - Supraganglionic infarction (M4-M6 cortex): 3 Total score (0-10 with 10 being normal): 10 IMPRESSION: 1. No acute intracranial hemorrhage or infarct. 2. ASPECTS is 10 3. Stable remote infarcts on a background of parenchymal volume loss and chronic white matter microangiopathy as above. These results were paged via AMION at the time of interpretation on 07/18/2021 at 4:22 pm to provider [facility] . Electronically Signed   By: Valetta Mole M.D.   On: 07/18/2021 16:48     Discharge Exam: Vitals:   07/20/21 2202 07/20/21 2203  BP:    Pulse: 69 71  Resp:    Temp:    SpO2: 97% 96%   Vitals:   07/20/21 1700  07/20/21 2201 07/20/21 2202 07/20/21 2203  BP: 138/61     Pulse:  71 69 71  Resp:  17    Temp:  (!) 97.3 F (36.3 C)    TempSrc:  Oral    SpO2:  97% 97% 96%  Weight:      Height:        General: Pt is alert, awake, not in acute distress Cardiovascular: RRR, S1/S2 +, no rubs, no gallops Respiratory: CTA bilaterally, no wheezing, no rhonchi Abdominal: Soft, NT, ND, bowel sounds + Extremities: no edema, no cyanosis, 3/5 power in right upper and lower extremity.  Normal power in left upper and lower extremity.    The results of significant diagnostics from this hospitalization (including imaging, microbiology, ancillary and laboratory) are listed below for reference.     Microbiology: Recent Results (from the past 240 hour(s))  Blood culture (routine x 2)     Status: None (Preliminary result)   Collection Time: 07/17/21  1:40 AM   Specimen: BLOOD  Result Value Ref Range Status   Specimen Description BLOOD LEFT ANTECUBITAL  Final   Special Requests   Final    BOTTLES DRAWN AEROBIC AND ANAEROBIC Blood Culture adequate volume   Culture   Final    NO GROWTH 4 DAYS Performed at [facility], [address] [place], [place] [address]    Report Status PENDING  Incomplete  Blood culture (routine x 2)     Status: None (Preliminary result)   Collection Time: 07/17/21  1:46 AM   Specimen: BLOOD  Result Value Ref Range Status   Specimen Description BLOOD BLOOD LEFT WRIST  Final   Special Requests   Final    BOTTLES DRAWN AEROBIC AND ANAEROBIC Blood Culture adequate volume   Culture   Final    NO GROWTH 4 DAYS Performed at [facility], [place] [address], [place], [place] [address]    Report Status PENDING  Incomplete  Resp Panel by RT-PCR (Flu A&B, Covid) Nasopharyngeal Swab     Status: Abnormal   Collection Time: 07/17/21 11:54 AM   Specimen: Nasopharyngeal Swab; Nasopharyngeal(NP) swabs in vial transport medium  Result Value Ref Range Status   SARS Coronavirus 2 by RT  PCR POSITIVE (A) NEGATIVE  Final    Comment: (NOTE) SARS-CoV-2 target nucleic acids are DETECTED.  The SARS-CoV-2 RNA is generally detectable in upper respiratory specimens during the acute phase of infection. Positive results are indicative of the presence of the identified virus, but do not rule out bacterial infection or co-infection with other pathogens not detected by the test. Clinical correlation with patient history and other diagnostic information is necessary to determine patient infection status. The expected result is Negative.  Fact Sheet for Patients: EntrepreneurPulse.com.au  Fact Sheet for Healthcare Providers: IncredibleEmployment.be  This test is not yet approved or cleared by the [place] FDA and  has been authorized for detection and/or diagnosis of SARS-CoV-2 by FDA under an Emergency Use Authorization (EUA).  This EUA will remain in effect (meaning this test can be used) for the duration of  the COVID-19 declaration under Section 564(b)(1) of the A ct, 21 U.S.C. section 360bbb-3(b)(1), unless the authorization is terminated or revoked sooner.     Influenza A by PCR NEGATIVE NEGATIVE Final   Influenza B by PCR NEGATIVE NEGATIVE Final    Comment: (NOTE) The Xpert Xpress SARS-CoV-2/FLU/RSV plus assay is intended as an aid in the diagnosis of influenza from Nasopharyngeal swab specimens and should not be used as a sole basis for treatment. Nasal washings and aspirates are unacceptable for Xpert Xpress SARS-CoV-2/FLU/RSV testing.  Fact Sheet for Patients: EntrepreneurPulse.com.au  Fact Sheet for Healthcare Providers: IncredibleEmployment.be  This test is not yet approved or cleared by the [place] FDA and has been authorized for detection and/or diagnosis of SARS-CoV-2 by FDA under an Emergency Use Authorization (EUA). This EUA will remain in effect (meaning this test can be  used) for the duration of the COVID-19 declaration under Section 564(b)(1) of the Act, 21 U.S.C. section 360bbb-3(b)(1), unless the authorization is terminated or revoked.  Performed at [facility], [place] [address], [place], [place] [address]      Labs: BNP (last 3 results) Recent Labs    07/17/21 0057  BNP 568.1*   Basic Metabolic Panel: Recent Labs  Lab 07/17/21 0057 07/17/21 0700 07/18/21 0423 07/19/21 0332  NA 135 136 132* 133*  K 3.8 3.8 3.8 3.9  CL 99 99 98 96*  CO2 '24 26 25 25  '$ GLUCOSE 193* 144* 110* 152*  BUN 31* 31* 35* 39*  CREATININE 2.46* 2.47* 2.36* 2.75*  CALCIUM 8.7* 8.3* 8.1* 8.3*  MG  --   --  1.9  --    Liver Function Tests: Recent Labs  Lab 07/17/21 0057 07/17/21 0700 07/18/21 0423 07/19/21 0332  AST 16 18 13* 12*  ALT '10 10 9 7  '$ ALKPHOS 61 52 50 50  BILITOT 2.6* 1.1 0.8 0.6  PROT 6.6 5.6* 5.5* 5.5*  ALBUMIN 3.7 3.1* 2.8* 2.8*   No results for input(s): LIPASE, AMYLASE in the last 168 hours. No results for input(s): AMMONIA in the last 168 hours. CBC: Recent Labs  Lab 07/17/21 0057 07/17/21 0700 07/18/21 0423 07/19/21 0332  WBC 13.3* 17.1* 7.8 5.1  NEUTROABS 9.7*  --  5.1 3.2  HGB 6.4* 5.2* 4.8* 4.8*  HCT 18.8* 15.9* 14.8* 14.6*  MCV 94.5 95.2 94.3 93.6  PLT 372 283 236 217   Cardiac Enzymes: No results for input(s): CKTOTAL, CKMB, CKMBINDEX, TROPONINI in the last 168 hours. BNP: Invalid input(s): POCBNP CBG: Recent Labs  Lab 07/18/21 1719 07/18/21 2122 07/19/21 0557 07/19/21 1135 07/19/21 1638  GLUCAP 179* 168* 135* 231* 197*   D-Dimer No results for  input(s): DDIMER in the last 72 hours. Hgb A1c No results for input(s): HGBA1C in the last 72 hours. Lipid Profile Recent Labs    07/19/21 0332  CHOL 83  HDL 38*  LDLCALC 35  TRIG 52  CHOLHDL 2.2   Thyroid function studies No results for input(s): TSH, T4TOTAL, T3FREE, THYROIDAB in the last 72 hours.  Invalid input(s): FREET3 Anemia work up No  results for input(s): VITAMINB12, FOLATE, FERRITIN, TIBC, IRON, RETICCTPCT in the last 72 hours. Urinalysis    Component Value Date/Time   COLORURINE YELLOW 07/17/2021 0058   APPEARANCEUR CLEAR 07/17/2021 0058   LABSPEC 1.008 07/17/2021 0058   PHURINE 7.0 07/17/2021 0058   GLUCOSEU NEGATIVE 07/17/2021 0058   HGBUR NEGATIVE 07/17/2021 0058   BILIRUBINUR NEGATIVE 07/17/2021 0058   KETONESUR NEGATIVE 07/17/2021 0058   PROTEINUR 100 (A) 07/17/2021 0058   UROBILINOGEN 0.2 01/22/2015 1031   NITRITE POSITIVE (A) 07/17/2021 0058   LEUKOCYTESUR MODERATE (A) 07/17/2021 0058   Sepsis Labs Invalid input(s): PROCALCITONIN,  WBC,  LACTICIDVEN Microbiology Recent Results (from the past 240 hour(s))  Blood culture (routine x 2)     Status: None (Preliminary result)   Collection Time: 07/17/21  1:40 AM   Specimen: BLOOD  Result Value Ref Range Status   Specimen Description BLOOD LEFT ANTECUBITAL  Final   Special Requests   Final    BOTTLES DRAWN AEROBIC AND ANAEROBIC Blood Culture adequate volume   Culture   Final    NO GROWTH 4 DAYS Performed at [facility], [address] [place], [place] [address]    Report Status PENDING  Incomplete  Blood culture (routine x 2)     Status: None (Preliminary result)   Collection Time: 07/17/21  1:46 AM   Specimen: BLOOD  Result Value Ref Range Status   Specimen Description BLOOD BLOOD LEFT WRIST  Final   Special Requests   Final    BOTTLES DRAWN AEROBIC AND ANAEROBIC Blood Culture adequate volume   Culture   Final    NO GROWTH 4 DAYS Performed at [facility], [place] [address] [place], [place] [address]    Report Status PENDING  Incomplete  Resp Panel by RT-PCR (Flu A&B, Covid) Nasopharyngeal Swab     Status: Abnormal   Collection Time: 07/17/21 11:54 AM   Specimen: Nasopharyngeal Swab; Nasopharyngeal(NP) swabs in vial transport medium  Result Value Ref Range Status   SARS Coronavirus 2 by RT PCR POSITIVE (A) NEGATIVE Final     Comment: (NOTE) SARS-CoV-2 target nucleic acids are DETECTED.  The SARS-CoV-2 RNA is generally detectable in upper respiratory specimens during the acute phase of infection. Positive results are indicative of the presence of the identified virus, but do not rule out bacterial infection or co-infection with other pathogens not detected by the test. Clinical correlation with patient history and other diagnostic information is necessary to determine patient infection status. The expected result is Negative.  Fact Sheet for Patients: EntrepreneurPulse.com.au  Fact Sheet for Healthcare Providers: IncredibleEmployment.be  This test is not yet approved or cleared by the [place] FDA and  has been authorized for detection and/or diagnosis of SARS-CoV-2 by FDA under an Emergency Use Authorization (EUA).  This EUA will remain in effect (meaning this test can be used) for the duration of  the COVID-19 declaration under Section 564(b)(1) of the A ct, 21 U.S.C. section 360bbb-3(b)(1), unless the authorization is terminated or revoked sooner.     Influenza A by PCR NEGATIVE NEGATIVE  Final   Influenza B by PCR NEGATIVE NEGATIVE Final    Comment: (NOTE) The Xpert Xpress SARS-CoV-2/FLU/RSV plus assay is intended as an aid in the diagnosis of influenza from Nasopharyngeal swab specimens and should not be used as a sole basis for treatment. Nasal washings and aspirates are unacceptable for Xpert Xpress SARS-CoV-2/FLU/RSV testing.  Fact Sheet for Patients: EntrepreneurPulse.com.au  Fact Sheet for Healthcare Providers: IncredibleEmployment.be  This test is not yet approved or cleared by the [place] FDA and has been authorized for detection and/or diagnosis of SARS-CoV-2 by FDA under an Emergency Use Authorization (EUA). This EUA will remain in effect (meaning this test can be used) for the duration of  the COVID-19 declaration under Section 564(b)(1) of the Act, 21 U.S.C. section 360bbb-3(b)(1), unless the authorization is terminated or revoked.  Performed at [facility], [place] [address] [place], [place] [address]      Time coordinating discharge: Over 30 minutes  SIGNED:   Darliss Cheney, MD  [facility] 07/21/2021, 10:16 AM  If 7PM-7AM, please contact night-coverage [email or web address]

## 2021-07-21 NOTE — TOC Transition Note (Signed)
Transition of Care (TOC) - CM/SW Discharge Note ? ? ?Patient Details  ?Name: [name] ?MRN: [id number] ?Date of Birth: 1930/08/26 ? ?Transition of Care (TOC) CM/SW Contact:  ?Reece Agar, LCSWA ?Phone Number: ?07/21/2021, 12:37 PM ? ? ?Clinical Narrative:    ?Patient will DC to: [facility] ?Anticipated DC date: 07/21/2021 ?Family notified: Pt Daughter ?Transport by: Corey Harold ? ? ?Per MD patient ready for DC to [facility]. RN to call report prior to discharge [phone]). RN, patient, patient's family, and facility notified of DC. Discharge Summary and FL2 sent to facility. DC packet on chart. Ambulance transport requested for patient.  ? ?CSW will sign off for now as social work intervention is no longer needed. Please consult [place] again if new needs arise. ?  ? ? ?  ?  ? ? ?Patient Goals and CMS Choice ?  ?  ?  ? ?Discharge Placement ?  ?           ?  ?  ?  ?  ? ?Discharge Plan and Services ?  ?  ?           ?  ?  ?  ?  ?  ?  ?  ?  ?  ?  ? ?Social Determinants of Health (SDOH) Interventions ?  ? ? ?Readmission Risk Interventions ?No flowsheet data found. ? ? ? ? ?

## 2021-07-21 NOTE — Progress Notes (Signed)
Manufacturing engineer [facility]) Hospital Liaison note.    ? ?This patient is approved to transfer to [facility] today.   ? ?[facility] will notify TOC when registration paperwork has been completed to arrange transport.   ? ?RN please call report to [phone]. Please leave his IV access in place.  ? ?Thank you,     ?Farrel Gordon, RN, CCM       ?[facility] Hospital Liaison  ?[phone] ?

## 2021-07-22 LAB — CULTURE, BLOOD (ROUTINE X 2)
Culture: NO GROWTH
Culture: NO GROWTH
Special Requests: ADEQUATE
Special Requests: ADEQUATE

## 2021-07-29 ENCOUNTER — Inpatient Hospital Stay (HOSPITAL_COMMUNITY): Admission: RE | Admit: 2021-07-29 | Payer: Medicare Other | Source: Ambulatory Visit

## 2021-07-30 ENCOUNTER — Inpatient Hospital Stay: Payer: Medicare Other | Admitting: Adult Health

## 2021-08-06 ENCOUNTER — Encounter: Payer: Self-pay | Admitting: Neurology

## 2021-08-06 ENCOUNTER — Inpatient Hospital Stay: Payer: PRIVATE HEALTH INSURANCE | Admitting: Neurology

## 2021-08-16 DEATH — deceased
